# Patient Record
Sex: Male | Born: 1960
Health system: Southern US, Community
[De-identification: ages and names within clinical notes are randomized; demographics above are authoritative.]

## PROBLEM LIST (undated history)

## (undated) DIAGNOSIS — N189 Chronic kidney disease, unspecified: Secondary | ICD-10-CM

## (undated) DIAGNOSIS — J189 Pneumonia, unspecified organism: Secondary | ICD-10-CM

## (undated) DIAGNOSIS — Z992 Dependence on renal dialysis: Secondary | ICD-10-CM

## (undated) DIAGNOSIS — K219 Gastro-esophageal reflux disease without esophagitis: Secondary | ICD-10-CM

## (undated) DIAGNOSIS — D649 Anemia, unspecified: Secondary | ICD-10-CM

## (undated) DIAGNOSIS — G4730 Sleep apnea, unspecified: Secondary | ICD-10-CM

## (undated) DIAGNOSIS — I10 Essential (primary) hypertension: Secondary | ICD-10-CM

## (undated) DIAGNOSIS — N186 End stage renal disease: Secondary | ICD-10-CM

## (undated) HISTORY — PX: MOUTH SURGERY: SHX715

## (undated) HISTORY — DX: Chronic kidney disease, unspecified: N18.9

## (undated) HISTORY — PX: COLONOSCOPY: SHX174

## (undated) SURGERY — COLONOSCOPY
Anesthesia: Moderate Sedation | Laterality: Left

---

## 2005-05-24 ENCOUNTER — Encounter: Admission: RE | Admit: 2005-05-24 | Discharge: 2005-05-24 | Payer: Self-pay | Admitting: Family Medicine

## 2009-12-23 ENCOUNTER — Emergency Department (HOSPITAL_COMMUNITY): Admission: EM | Admit: 2009-12-23 | Discharge: 2009-12-23 | Payer: Self-pay | Admitting: Emergency Medicine

## 2010-12-30 ENCOUNTER — Encounter: Payer: Self-pay | Admitting: Family Medicine

## 2011-08-14 ENCOUNTER — Ambulatory Visit: Payer: Self-pay | Admitting: Dietician

## 2012-11-13 ENCOUNTER — Observation Stay (HOSPITAL_COMMUNITY)
Admission: EM | Admit: 2012-11-13 | Discharge: 2012-11-13 | Disposition: A | Discharge status: Home / Self Care | Payer: Managed Care, Other (non HMO) | Attending: Emergency Medicine | Admitting: Emergency Medicine

## 2012-11-13 ENCOUNTER — Observation Stay (HOSPITAL_COMMUNITY): Payer: Managed Care, Other (non HMO)

## 2012-11-13 ENCOUNTER — Encounter (HOSPITAL_COMMUNITY): Payer: Self-pay | Admitting: *Deleted

## 2012-11-13 DIAGNOSIS — G459 Transient cerebral ischemic attack, unspecified: Secondary | ICD-10-CM

## 2012-11-13 DIAGNOSIS — H538 Other visual disturbances: Principal | ICD-10-CM | POA: Insufficient documentation

## 2012-11-13 DIAGNOSIS — R7989 Other specified abnormal findings of blood chemistry: Secondary | ICD-10-CM

## 2012-11-13 DIAGNOSIS — Z79899 Other long term (current) drug therapy: Secondary | ICD-10-CM | POA: Insufficient documentation

## 2012-11-13 DIAGNOSIS — R944 Abnormal results of kidney function studies: Secondary | ICD-10-CM | POA: Insufficient documentation

## 2012-11-13 DIAGNOSIS — H539 Unspecified visual disturbance: Secondary | ICD-10-CM

## 2012-11-13 DIAGNOSIS — I10 Essential (primary) hypertension: Secondary | ICD-10-CM | POA: Insufficient documentation

## 2012-11-13 HISTORY — DX: Essential (primary) hypertension: I10

## 2012-11-13 LAB — URINE MICROSCOPIC-ADD ON

## 2012-11-13 LAB — CBC WITH DIFFERENTIAL/PLATELET
Basophils Absolute: 0 10*3/uL (ref 0.0–0.1)
Eosinophils Relative: 2 % (ref 0–5)
Lymphocytes Relative: 20 % (ref 12–46)
Lymphs Abs: 2.2 10*3/uL (ref 0.7–4.0)
Neutro Abs: 8.1 10*3/uL — ABNORMAL HIGH (ref 1.7–7.7)
Platelets: 293 10*3/uL (ref 150–400)
WBC: 11 10*3/uL — ABNORMAL HIGH (ref 4.0–10.5)

## 2012-11-13 LAB — COMPREHENSIVE METABOLIC PANEL
AST: 26 U/L (ref 0–37)
Alkaline Phosphatase: 136 U/L — ABNORMAL HIGH (ref 39–117)
BUN: 28 mg/dL — ABNORMAL HIGH (ref 6–23)
CO2: 23 mEq/L (ref 19–32)
Calcium: 9.3 mg/dL (ref 8.4–10.5)
Creatinine, Ser: 3.72 mg/dL — ABNORMAL HIGH (ref 0.50–1.35)
Glucose, Bld: 87 mg/dL (ref 70–99)
Total Bilirubin: 0.4 mg/dL (ref 0.3–1.2)
Total Protein: 7.5 g/dL (ref 6.0–8.3)

## 2012-11-13 LAB — URINALYSIS, ROUTINE W REFLEX MICROSCOPIC
Bilirubin Urine: NEGATIVE
Specific Gravity, Urine: 1.015 (ref 1.005–1.030)
pH: 5.5 (ref 5.0–8.0)

## 2012-11-13 LAB — LIPID PANEL
HDL: 41 mg/dL (ref >39)
LDL Cholesterol: 125 mg/dL — ABNORMAL HIGH (ref 0–99)
Triglycerides: 184 mg/dL — ABNORMAL HIGH (ref <150)
VLDL: 37 mg/dL (ref 0–40)

## 2012-11-13 MED ORDER — ASPIRIN 325 MG PO TBEC
325.0000 mg | DELAYED_RELEASE_TABLET | Freq: Every day | ORAL | Status: DC
Start: 2012-11-13 — End: 2014-01-03

## 2012-11-13 MED ORDER — SODIUM CHLORIDE 0.9 % IV SOLN: Freq: Once | INTRAVENOUS | Status: DC

## 2012-11-13 MED ORDER — ENOXAPARIN SODIUM 40 MG/0.4ML ~~LOC~~ SOLN
40.0000 mg | SUBCUTANEOUS | Status: DC
  Administered 2012-11-13: 40 mg via SUBCUTANEOUS
  Filled 2012-11-13: qty 0.4

## 2012-11-13 NOTE — ED Notes (Signed)
Checked patient vision it was glasses 20/20 both eyes, left eye 20/20, right eye 20/20

## 2012-11-13 NOTE — ED Notes (Signed)
Blurred vision since waking this am. Seen at on-site care facility at [facility] and sent to ED for further eval. Pt states eye exam was performed without diagnosis. Pt states his he is seeing blurred from right eye which has 'gotten better'. No HA. No neuro deficits. Speech clear. Ambulatory without diff

## 2012-11-13 NOTE — Progress Notes (Signed)
  Echocardiogram 2D Echocardiogram has been performed.  Diamond Nickel 11/13/2012, 3:45 PM

## 2012-11-13 NOTE — ED Provider Notes (Signed)
Medical screening examination/treatment/procedure(s) were conducted as a shared visit with non-physician practitioner(s) and myself.  I personally evaluated the patient during the encounter  Ezequiel Essex, MD 11/13/12 2245

## 2012-11-13 NOTE — ED Provider Notes (Signed)
History   This chart was scribed for Ezequiel Essex, MD by Shona Needles, ED Scribe. The patient was seen in room TR04C/TR04C. Patient's care was started at 1231.  CSN: [id number]  Arrival date & time 11/13/12  1231   First MD Initiated Contact with Patient 11/13/12 1257      Chief Complaint  Patient presents with  . Blurred Vision    HPI   Travis [name] is a 51 y.o. male with h/o HTN, who presents to the Emergency Department complaining of 6 hours of new, constant, gradually improving, moderate blurred vision. No pain or injury mechanism denote. Typically healthy, Pt denies CC last night. Prior to arrival, Pt was evaluated by nurse practitioner at work where tests were preformed. No pertinent finding were found, though Pt was encouraged to follow up with ED for possible TIA. Vision is moderately improved upon arrival without treatment. No pain, speech difficulty, photophobia, weakness, chest pain, SOB, or n/v/d. Pt denies use of tobacco products, consumption of alcohol, and use of illicit drugs. Pt denotes inconsistent use of Rx lenses. No pertinent surgical Hx denoted.   Past Medical History  Diagnosis Date  . Hypertension     History reviewed. No pertinent past surgical history.  No family history on file.  History  Substance Use Topics  . Smoking status: Never Smoker   . Smokeless tobacco: Not on file  . Alcohol Use: Yes     Comment: sometimes      Review of Systems  Eyes: Positive for visual disturbance.  All other systems reviewed and are negative.    Allergies  Ibuprofen  Home Medications   Current Outpatient Rx  Name  Route  Sig  Dispense  Refill  . AMLODIPINE BESYLATE 10 MG PO TABS   Oral   Take 10 mg by mouth daily.         . AMOXICILLIN-POT CLAVULANATE 875-125 MG PO TABS   Oral   Take 1 tablet by mouth 2 (two) times daily. For 10 days.         . FEBUXOSTAT 40 MG PO TABS   Oral   Take 40 mg by mouth daily.         Marland Kitchen HYDROCHLOROTHIAZIDE  25 MG PO TABS   Oral   Take 25 mg by mouth daily.         Marland Kitchen RENA-VITE PO TABS   Oral   Take 1 tablet by mouth daily.         Marland Kitchen PARICALCITOL 2 MCG PO CAPS   Oral   Take 2 mcg by mouth daily.         . QUINAPRIL HCL 40 MG PO TABS   Oral   Take 40 mg by mouth at bedtime.           BP 152/106  Pulse 81  Temp 98.3 F (36.8 C) (Oral)  Resp 18  SpO2 97%  Physical Exam  Nursing note and vitals reviewed. Constitutional: He appears well-developed and well-nourished.  HENT:  Head: Normocephalic and atraumatic.  Eyes: Conjunctivae normal are normal. Pupils are equal, round, and reactive to light.  Neck: Neck supple. No tracheal deviation present. No thyromegaly present.  Cardiovascular: Normal rate and regular rhythm.   No murmur heard. Pulmonary/Chest: Effort normal and breath sounds normal.  Abdominal: Soft. Bowel sounds are normal. He exhibits no distension. There is no tenderness.  Musculoskeletal: Normal range of motion. He exhibits no edema and no tenderness.  Neurological: He is alert. Coordination  normal.       Cranial nerves 2-12 intact. 5/5 strength throughout. No ataxia finger to nose. No nystagmus.   Skin: Skin is warm and dry. No rash noted.  Psychiatric: He has a normal mood and affect.    ED Course  Procedures DIAGNOSTIC STUDIES: Oxygen Saturation is 97% on room air, normal by my interpretation.    COORDINATION OF CARE: 13:01- Evaluated Pt. Pt is awake, alert, and without distress.      Labs Reviewed  CBC WITH DIFFERENTIAL  COMPREHENSIVE METABOLIC PANEL  TROPONIN I  PROTIME-INR  APTT  URINALYSIS, ROUTINE W REFLEX MICROSCOPIC  LIPID PANEL  HEMOGLOBIN A1C   No results found.   1. Visual changes       MDM  Change in vision in the right eye since waking up this morning, now improved. No weakness, numbness, tingling, speech or swallowing problems. No headache. History of hypertension.  Nonfocal neuro exam at this time. Visual acuity  normal, eye exam no consider TIA with ABCD 2 score of 3.  Patient agreeable to TIA workup in CDU.   Date: 11/13/2012  Rate: 87  Rhythm: normal sinus rhythm  QRS Axis: normal  Intervals: normal  ST/T Wave abnormalities: normal  Conduction Disutrbances:none  Narrative Interpretation:   Old EKG Reviewed: none available    I personally performed the services described in this documentation, which was scribed in my presence. The recorded information has been reviewed and is accurate.     Ezequiel Essex, MD 11/13/12 [phone]

## 2012-11-13 NOTE — ED Provider Notes (Signed)
Patient in CDU under TIA protocol.  Diagnostic testing has been completed.  Results reviewed, discussed with Dr. Wyvonnia Dusky and shared with patient.  Patient is followed by [facility] for renal insufficiency.  Patient's continuing medical care provided by a NPP at his place of employment, but he has seen the [facility] for primary care in the recent past.  Patient remains symptom-free.  Lungs CTA bilaterally.  S1/S2, RRR. Abdomen soft, bowel sounds present.  MOEx4, equal grip strength.  Strong distal pulses palpated.  Patient will be discharged home with follow-up with [facility] as scheduled this month.  Will be started on aspirin regimen.  Patient currently on medication for hypertension and cholesterolemia.    Norman Herrlich, NP 11/13/12 2340

## 2012-11-13 NOTE — ED Notes (Signed)
Patient transported from MRI 

## 2012-11-13 NOTE — ED Notes (Signed)
Checked patient cbg it was 1 notified RN of blood sugar

## 2012-11-13 NOTE — ED Provider Notes (Signed)
Travis [name] is a 51 y.o. male transferred to CDU from fast track. Signout from Dr. Wyvonnia Dusky as follows: Patient with resolving change in vision earlier in the day. Ophthalmic and neuro exam are normal. No abnormalities on head CT. Patient has ABCD2 score of 3. Patient will be placed on TIA protocol he also has a elevated creatinine of 3.0 he is followed by [facility] for his CRF.  Pt seen and examined at the bedside he is resting comfortably with family is in the room. He reports a resolution to visual changes. Pupils equal round and reactive to light, heart is regular rate and rhythm with no murmurs rubs or gallops, lung sounds are clear to auscultation bilaterally and abdominal exam is benign with no tenderness to palpation or rebound. Strength is 5 out of 5x4 extremities, speech is goal oriented and there is no facial asymmetry.   Signout given to NP Tamala Julian at shift change    Monico Blitz, PA-C 11/13/12 1616

## 2012-11-13 NOTE — Progress Notes (Signed)
VASCULAR LAB PRELIMINARY  PRELIMINARY  PRELIMINARY  PRELIMINARY  Carotid duplex  completed.    Preliminary report: Bilateral:  No evidence of hemodynamically significant internal carotid artery stenosis.   Vertebral artery flow is antegrade.     Takeysha Bonk, [place], RVS 11/13/2012, 4:03 PM

## 2012-11-13 NOTE — ED Notes (Signed)
Pt continues in MRI

## 2012-11-14 NOTE — ED Provider Notes (Signed)
Medical screening examination/treatment/procedure(s) were conducted as a shared visit with non-physician practitioner(s) and myself.  I personally evaluated the patient during the encounter   Ezequiel Essex, MD 11/14/12 1140

## 2013-11-29 ENCOUNTER — Other Ambulatory Visit: Payer: Self-pay | Admitting: *Deleted

## 2013-11-29 DIAGNOSIS — N185 Chronic kidney disease, stage 5: Secondary | ICD-10-CM

## 2013-11-29 DIAGNOSIS — Z01810 Encounter for preprocedural cardiovascular examination: Secondary | ICD-10-CM

## 2013-12-30 ENCOUNTER — Encounter: Payer: Self-pay | Admitting: Vascular Surgery

## 2013-12-31 ENCOUNTER — Encounter: Payer: Self-pay | Admitting: Vascular Surgery

## 2013-12-31 ENCOUNTER — Ambulatory Visit (INDEPENDENT_AMBULATORY_CARE_PROVIDER_SITE_OTHER): Payer: BC Managed Care – PPO | Admitting: Vascular Surgery

## 2013-12-31 ENCOUNTER — Ambulatory Visit (INDEPENDENT_AMBULATORY_CARE_PROVIDER_SITE_OTHER)
Admission: RE | Admit: 2013-12-31 | Discharge: 2013-12-31 | Disposition: A | Discharge status: Home / Self Care | Payer: BC Managed Care – PPO | Source: Ambulatory Visit | Attending: Vascular Surgery | Admitting: Vascular Surgery

## 2013-12-31 ENCOUNTER — Other Ambulatory Visit: Payer: Self-pay

## 2013-12-31 ENCOUNTER — Ambulatory Visit (HOSPITAL_COMMUNITY)
Admission: RE | Admit: 2013-12-31 | Discharge: 2013-12-31 | Disposition: A | Discharge status: Home / Self Care | Payer: BC Managed Care – PPO | Source: Ambulatory Visit | Attending: Vascular Surgery | Admitting: Vascular Surgery

## 2013-12-31 ENCOUNTER — Encounter (INDEPENDENT_AMBULATORY_CARE_PROVIDER_SITE_OTHER): Payer: Self-pay

## 2013-12-31 VITALS — BP 159/100 | HR 78 | Ht 71.0 in | Wt 254.0 lb

## 2013-12-31 DIAGNOSIS — N185 Chronic kidney disease, stage 5: Secondary | ICD-10-CM | POA: Insufficient documentation

## 2013-12-31 DIAGNOSIS — N186 End stage renal disease: Secondary | ICD-10-CM

## 2013-12-31 DIAGNOSIS — Z01810 Encounter for preprocedural cardiovascular examination: Secondary | ICD-10-CM

## 2013-12-31 NOTE — Progress Notes (Signed)
Referred by:  Sherril Croon, MD [address] [place], [place] [address]  Reason for referral: New access  History of Present Illness  Travis [name] is a 53 y.o. (01-29-1961) male who presents for evaluation for permanent access.  The patient is right hand dominant.  The patient has not had previous access procedures.  Previous central venous cannulation procedures include: never.  The patient has never had a PPM placed.  He has focal segmental glomerulosclerosis and HTN as the source of his CKD.  Additionally, he is going to undergo a radial nephrectomy due to suspicious mass in R kidney.    Past Medical History  Diagnosis Date  . Hypertension   . Chronic kidney disease    Past Surgical History: none  History   Social History  . Marital Status: Married    Spouse Name: N/A    Number of Children: N/A  . Years of Education: N/A   Occupational History  . Not on file.   Social History Main Topics  . Smoking status: Former Smoker    Quit date: 12/31/2012  . Smokeless tobacco: Not on file  . Alcohol Use: No  . Drug Use: No  . Sexual Activity: Not on file   Other Topics Concern  . Not on file   Social History Narrative  . No narrative on file    Family History  Problem Relation Age of Onset  . Hypertension Mother   . Hypertension Father   . Hypertension Sister    Current Outpatient Prescriptions on File Prior to Visit  Medication Sig Dispense Refill  . amLODipine (NORVASC) 10 MG tablet Take 10 mg by mouth daily.      . hydrochlorothiazide (HYDRODIURIL) 25 MG tablet Take 25 mg by mouth daily.      . multivitamin (RENA-VIT) TABS tablet Take 1 tablet by mouth daily.      . paricalcitol (ZEMPLAR) 2 MCG capsule Take 2 mcg by mouth daily.      . quinapril (ACCUPRIL) 40 MG tablet Take 40 mg by mouth at bedtime.      Marland Kitchen amoxicillin-clavulanate (AUGMENTIN) 875-125 MG per tablet Take 1 tablet by mouth 2 (two) times daily. For 10 days.      Marland Kitchen aspirin (ASPIR-TRIN) 325 MG EC  tablet Take 1 tablet (325 mg total) by mouth daily.  30 tablet  0  . febuxostat (ULORIC) 40 MG tablet Take 40 mg by mouth daily.       No current facility-administered medications on file prior to visit.    Allergies  Allergen Reactions  . Ibuprofen Hives    REVIEW OF SYSTEMS:  (Positives checked otherwise negative)  CARDIOVASCULAR:  []  chest pain, []  chest pressure, []  palpitations, []  shortness of breath when laying flat, []  shortness of breath with exertion,  []  pain in feet when walking, []  pain in feet when laying flat, []  history of blood clot in veins (DVT), []  history of phlebitis, []  swelling in legs, []  varicose veins  PULMONARY:  []  productive cough, []  asthma, []  wheezing  NEUROLOGIC:  []  weakness in arms or legs, []  numbness in arms or legs, []  difficulty speaking or slurred speech, []  temporary loss of vision in one eye, []  dizziness  HEMATOLOGIC:  []  bleeding problems, []  problems with blood clotting too easily  MUSCULOSKEL:  []  joint pain, []  joint swelling  GASTROINTEST:  []  vomiting blood, []  blood in stool     GENITOURINARY:  []  burning with urination, []  blood in  urine  PSYCHIATRIC:  []  history of major depression  INTEGUMENTARY:  []  rashes, []  ulcers  CONSTITUTIONAL:  []  fever, []  chills   Physical Examination  Filed Vitals:   12/31/13 1055  BP: 159/100  Pulse: 78  Height: 5\' 11"  (1.803 m)  Weight: 254 lb (115.214 kg)  SpO2: 100%   Body mass index is 35.44 kg/(m^2).  General: A&O x 3, WD, mildly obese  Head: Leigh/AT  Ear/Nose/Throat: Hearing grossly intact, nares w/o erythema or drainage, oropharynx w/o Erythema/Exudate, Mallampati score: 3  Eyes: PERRLA, EOMI  Neck: Supple, no nuchal rigidity, no palpable LAD  Pulmonary: Sym exp, good air movt, CTAB, no rales, rhonchi, & wheezing  Cardiac: RRR, Nl S1, S2, no Murmurs, rubs or gallops  Vascular: Vessel Right Left  Radial Palpable Palpable  Ulnar Palpable Palpable  Brachial Palpable  Palpable  Carotid Palpable, without bruit Palpable, without bruit  Aorta Not palpable N/A  Femoral Palpable Palpable  Popliteal Not palpable Not palpable  PT Palpable Palpable  DP Not Palpable Not Palpable   Gastrointestinal: soft, NTND, -G/R, - HSM, - masses, - CVAT B  Musculoskeletal: M/S 5/5 throughout , Extremities without ischemic changes   Neurologic: CN 2-12 intact , Pain and light touch intact in extremities , Motor exam as listed above  Psychiatric: Judgment intact, Mood & affect appropriate for pt's clinical situation  Dermatologic: See M/S exam for extremity exam, no rashes otherwise noted  Lymph : No Cervical, Axillary, or Inguinal lymphadenopathy   Non-Invasive Vascular Imaging  Vein Mapping  (Date: 12/31/2013):   R arm: acceptable vein conduits include Marginal cephalic and basilic vein  L arm: acceptable vein conduits include basilic vein  Outside Studies/Documentation 5 pages of outside documents were reviewed including: nephrology clinic chart.  Medical Decision Making  Travis [name] is a 53 y.o. 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  I had an extensive discussion with this patient in regards to the nature of access surgery, including risk, benefits, and alternatives.    The patient is aware that the risks of access surgery include but are not limited to: bleeding, infection, steal syndrome, nerve damage, ischemic monomelic neuropathy, failure of access to mature, and possible need for additional access procedures in the future. I discussed with the patient the nature of the staged access procedure, specifically the need for a second operation to transpose the first stage fistula if it  matures adequately.    The patient has agreed to proceed with the above procedure which will be scheduled.  Adele Barthel, MD [facility] Office: [phone] Pager: [phone]  12/31/2013, 11:40 AM

## 2014-01-03 ENCOUNTER — Encounter (HOSPITAL_COMMUNITY)
Admission: RE | Admit: 2014-01-03 | Discharge: 2014-01-03 | Disposition: A | Discharge status: Home / Self Care | Payer: BC Managed Care – PPO | Source: Ambulatory Visit | Attending: Vascular Surgery | Admitting: Vascular Surgery

## 2014-01-03 ENCOUNTER — Encounter (HOSPITAL_COMMUNITY)
Admission: RE | Admit: 2014-01-03 | Discharge: 2014-01-03 | Disposition: A | Discharge status: Home / Self Care | Payer: BC Managed Care – PPO | Source: Ambulatory Visit | Attending: Anesthesiology | Admitting: Anesthesiology

## 2014-01-03 ENCOUNTER — Encounter (HOSPITAL_COMMUNITY): Payer: Self-pay

## 2014-01-03 ENCOUNTER — Other Ambulatory Visit (HOSPITAL_COMMUNITY): Payer: Self-pay | Admitting: *Deleted

## 2014-01-03 HISTORY — DX: Sleep apnea, unspecified: G47.30

## 2014-01-03 HISTORY — DX: Pneumonia, unspecified organism: J18.9

## 2014-01-03 HISTORY — DX: Gastro-esophageal reflux disease without esophagitis: K21.9

## 2014-01-03 LAB — CBC
HEMATOCRIT: 33.6 % — AB (ref 39.0–52.0)
Hemoglobin: 11.4 g/dL — ABNORMAL LOW (ref 13.0–17.0)
MCH: 27.8 pg (ref 26.0–34.0)
MCHC: 33.9 g/dL (ref 30.0–36.0)
MCV: 82 fL (ref 78.0–100.0)
PLATELETS: 275 10*3/uL (ref 150–400)
RBC: 4.1 MIL/uL — AB (ref 4.22–5.81)
RDW: 15 % (ref 11.5–15.5)
WBC: 9.9 10*3/uL (ref 4.0–10.5)

## 2014-01-03 NOTE — Pre-Procedure Instructions (Signed)
Travis [name]  01/03/2014   Your procedure is scheduled on:  Wednesday, January 05, 2014 at 8:30 AM.   Report to [facility] Entrance "A" Admitting Office at 6:30 AM.   Call this number if you have problems the morning of surgery: [phone]   Remember:   Do not eat food or drink liquids after midnight Tuesday, 01/04/14.   Take these medicines the morning of surgery with A SIP OF WATER: amLODipine (NORVASC), Nexium     Do not wear jewelry.  Do not wear lotions, powders, or cologne. You may wear deodorant.  Men may shave face and neck.  Do not bring valuables to the hospital.  [facility] is not responsible                  for any belongings or valuables.               Contacts, dentures or bridgework may not be worn into surgery.  Leave suitcase in the car. After surgery it may be brought to your room.  For patients admitted to the hospital, discharge time is determined by your                treatment team.               Patients discharged the day of surgery will not be allowed to drive  home.  Name and phone number of your driver: Family/friend   Special Instructions: Shower using CHG 2 nights before surgery and the night before surgery.  If you shower the day of surgery use CHG.  Use special wash - you have one bottle of CHG for all showers.  You should use approximately 1/3 of the bottle for each shower.   Please read over the following fact sheets that you were given: Pain Booklet, Coughing and Deep Breathing and Surgical Site Infection Prevention

## 2014-01-04 MED ORDER — DEXTROSE 5 % IV SOLN
1.5000 g | INTRAVENOUS | Status: AC
  Administered 2014-01-05: 1.5 g via INTRAVENOUS
  Filled 2014-01-04: qty 1.5

## 2014-01-05 ENCOUNTER — Encounter (HOSPITAL_COMMUNITY): Payer: Self-pay | Admitting: Certified Registered Nurse Anesthetist

## 2014-01-05 ENCOUNTER — Telehealth: Payer: Self-pay | Admitting: Vascular Surgery

## 2014-01-05 ENCOUNTER — Ambulatory Visit (HOSPITAL_COMMUNITY): Payer: BC Managed Care – PPO | Admitting: Certified Registered Nurse Anesthetist

## 2014-01-05 ENCOUNTER — Encounter (HOSPITAL_COMMUNITY)
Admission: RE | Disposition: A | Discharge status: Home / Self Care | Payer: Self-pay | Source: Ambulatory Visit | Attending: Vascular Surgery

## 2014-01-05 ENCOUNTER — Other Ambulatory Visit: Payer: Self-pay | Admitting: *Deleted

## 2014-01-05 ENCOUNTER — Ambulatory Visit (HOSPITAL_COMMUNITY)
Admission: RE | Admit: 2014-01-05 | Discharge: 2014-01-05 | Disposition: A | Discharge status: Home / Self Care | Payer: BC Managed Care – PPO | Source: Ambulatory Visit | Attending: Vascular Surgery | Admitting: Vascular Surgery

## 2014-01-05 ENCOUNTER — Encounter (HOSPITAL_COMMUNITY): Payer: BC Managed Care – PPO | Admitting: Certified Registered Nurse Anesthetist

## 2014-01-05 DIAGNOSIS — I120 Hypertensive chronic kidney disease with stage 5 chronic kidney disease or end stage renal disease: Secondary | ICD-10-CM | POA: Insufficient documentation

## 2014-01-05 DIAGNOSIS — N032 Chronic nephritic syndrome with diffuse membranous glomerulonephritis: Secondary | ICD-10-CM | POA: Insufficient documentation

## 2014-01-05 DIAGNOSIS — Z87891 Personal history of nicotine dependence: Secondary | ICD-10-CM | POA: Insufficient documentation

## 2014-01-05 DIAGNOSIS — Z6835 Body mass index (BMI) 35.0-35.9, adult: Secondary | ICD-10-CM | POA: Insufficient documentation

## 2014-01-05 DIAGNOSIS — N186 End stage renal disease: Secondary | ICD-10-CM

## 2014-01-05 DIAGNOSIS — E669 Obesity, unspecified: Secondary | ICD-10-CM | POA: Insufficient documentation

## 2014-01-05 DIAGNOSIS — N185 Chronic kidney disease, stage 5: Secondary | ICD-10-CM

## 2014-01-05 DIAGNOSIS — Z48812 Encounter for surgical aftercare following surgery on the circulatory system: Secondary | ICD-10-CM

## 2014-01-05 DIAGNOSIS — Z7982 Long term (current) use of aspirin: Secondary | ICD-10-CM | POA: Insufficient documentation

## 2014-01-05 HISTORY — PX: BASCILIC VEIN TRANSPOSITION: SHX5742

## 2014-01-05 LAB — POCT I-STAT 4, (NA,K, GLUC, HGB,HCT)
GLUCOSE: 95 mg/dL (ref 70–99)
HEMATOCRIT: 33 % — AB (ref 39.0–52.0)
Hemoglobin: 11.2 g/dL — ABNORMAL LOW (ref 13.0–17.0)
Potassium: 3.8 mEq/L (ref 3.7–5.3)
Sodium: 145 mEq/L (ref 137–147)

## 2014-01-05 SURGERY — TRANSPOSITION, VEIN, BASILIC
Anesthesia: General | Site: Arm Upper | Laterality: Left

## 2014-01-05 MED ORDER — LIDOCAINE-EPINEPHRINE (PF) 1 %-1:200000 IJ SOLN
INTRAMUSCULAR | Status: DC | PRN
  Administered 2014-01-05: 30 mL

## 2014-01-05 MED ORDER — OXYCODONE HCL 5 MG PO TABS: 5.0000 mg | ORAL_TABLET | ORAL | Status: DC | PRN

## 2014-01-05 MED ORDER — SUCCINYLCHOLINE CHLORIDE 20 MG/ML IJ SOLN
INTRAMUSCULAR | Status: AC
  Filled 2014-01-05: qty 1

## 2014-01-05 MED ORDER — THROMBIN 20000 UNITS EX SOLR
CUTANEOUS | Status: AC
  Filled 2014-01-05: qty 20000

## 2014-01-05 MED ORDER — FENTANYL CITRATE 0.05 MG/ML IJ SOLN
INTRAMUSCULAR | Status: DC | PRN
  Administered 2014-01-05: 25 ug via INTRAVENOUS
  Administered 2014-01-05: 50 ug via INTRAVENOUS
  Administered 2014-01-05: 25 ug via INTRAVENOUS
  Administered 2014-01-05: 50 ug via INTRAVENOUS

## 2014-01-05 MED ORDER — LIDOCAINE HCL (CARDIAC) 20 MG/ML IV SOLN
INTRAVENOUS | Status: AC
  Filled 2014-01-05: qty 5

## 2014-01-05 MED ORDER — ONDANSETRON HCL 4 MG/2ML IJ SOLN
INTRAMUSCULAR | Status: AC
  Filled 2014-01-05: qty 2

## 2014-01-05 MED ORDER — OXYCODONE HCL 5 MG PO TABS
ORAL_TABLET | ORAL | Status: AC
  Administered 2014-01-05: 5 mg via ORAL
  Filled 2014-01-05: qty 1

## 2014-01-05 MED ORDER — HYDROMORPHONE HCL PF 1 MG/ML IJ SOLN
0.2500 mg | INTRAMUSCULAR | Status: DC | PRN
  Administered 2014-01-05 (×2): 0.5 mg via INTRAVENOUS

## 2014-01-05 MED ORDER — ROCURONIUM BROMIDE 50 MG/5ML IV SOLN
INTRAVENOUS | Status: AC
  Filled 2014-01-05: qty 1

## 2014-01-05 MED ORDER — HYDROMORPHONE HCL PF 1 MG/ML IJ SOLN
INTRAMUSCULAR | Status: AC
  Administered 2014-01-05: 0.5 mg via INTRAVENOUS
  Filled 2014-01-05: qty 1

## 2014-01-05 MED ORDER — 0.9 % SODIUM CHLORIDE (POUR BTL) OPTIME
TOPICAL | Status: DC | PRN
  Administered 2014-01-05: 1000 mL

## 2014-01-05 MED ORDER — FENTANYL CITRATE 0.05 MG/ML IJ SOLN
INTRAMUSCULAR | Status: AC
  Filled 2014-01-05: qty 5

## 2014-01-05 MED ORDER — STERILE WATER FOR INJECTION IJ SOLN
INTRAMUSCULAR | Status: AC
  Filled 2014-01-05: qty 10

## 2014-01-05 MED ORDER — PROPOFOL INFUSION 10 MG/ML OPTIME
INTRAVENOUS | Status: DC | PRN
  Administered 2014-01-05 (×2): 75 ug/kg/min via INTRAVENOUS

## 2014-01-05 MED ORDER — OXYCODONE HCL 5 MG PO TABS
5.0000 mg | ORAL_TABLET | Freq: Once | ORAL | Status: AC
  Administered 2014-01-05: 5 mg via ORAL

## 2014-01-05 MED ORDER — SODIUM CHLORIDE 0.9 % IV SOLN: INTRAVENOUS | Status: DC

## 2014-01-05 MED ORDER — PROPOFOL 10 MG/ML IV BOLUS
INTRAVENOUS | Status: AC
  Filled 2014-01-05: qty 20

## 2014-01-05 MED ORDER — SODIUM CHLORIDE 0.9 % IV SOLN
INTRAVENOUS | Status: DC | PRN
  Administered 2014-01-05 (×2): via INTRAVENOUS

## 2014-01-05 MED ORDER — MIDAZOLAM HCL 2 MG/2ML IJ SOLN
INTRAMUSCULAR | Status: AC
  Filled 2014-01-05: qty 2

## 2014-01-05 MED ORDER — MIDAZOLAM HCL 5 MG/5ML IJ SOLN
INTRAMUSCULAR | Status: DC | PRN
Start: 2014-01-05 — End: 2014-01-05
  Administered 2014-01-05: 2 mg via INTRAVENOUS

## 2014-01-05 MED ORDER — PHENYLEPHRINE 40 MCG/ML (10ML) SYRINGE FOR IV PUSH (FOR BLOOD PRESSURE SUPPORT)
PREFILLED_SYRINGE | INTRAVENOUS | Status: AC
  Filled 2014-01-05: qty 10

## 2014-01-05 MED ORDER — PROPOFOL 10 MG/ML IV EMUL
INTRAVENOUS | Status: AC
  Filled 2014-01-05: qty 100

## 2014-01-05 MED ORDER — EPHEDRINE SULFATE 50 MG/ML IJ SOLN
INTRAMUSCULAR | Status: AC
  Filled 2014-01-05: qty 1

## 2014-01-05 MED ORDER — BUPIVACAINE HCL (PF) 0.5 % IJ SOLN
INTRAMUSCULAR | Status: DC | PRN
  Administered 2014-01-05: 30 mL

## 2014-01-05 MED ORDER — ONDANSETRON HCL 4 MG/2ML IJ SOLN
INTRAMUSCULAR | Status: DC | PRN
  Administered 2014-01-05: 4 mg via INTRAVENOUS

## 2014-01-05 MED ORDER — SODIUM CHLORIDE 0.9 % IR SOLN
Status: DC | PRN
  Administered 2014-01-05: 09:00:00

## 2014-01-05 MED ORDER — PROPOFOL 10 MG/ML IV BOLUS
INTRAVENOUS | Status: AC
Start: 2014-01-05 — End: 2014-01-05
  Filled 2014-01-05: qty 20

## 2014-01-05 SURGICAL SUPPLY — 41 items
ADH SKN CLS APL DERMABOND .7 (GAUZE/BANDAGES/DRESSINGS) ×1
CANISTER SUCTION 2500CC (MISCELLANEOUS) ×3 IMPLANT
CLIP TI MEDIUM 24 (CLIP) ×3 IMPLANT
CLIP TI WIDE RED SMALL 24 (CLIP) ×3 IMPLANT
CORDS BIPOLAR (ELECTRODE) IMPLANT
COVER PROBE W GEL 5X96 (DRAPES) IMPLANT
COVER SURGICAL LIGHT HANDLE (MISCELLANEOUS) ×3 IMPLANT
DECANTER SPIKE VIAL GLASS SM (MISCELLANEOUS) ×3 IMPLANT
DERMABOND ADVANCED (GAUZE/BANDAGES/DRESSINGS) ×2
DERMABOND ADVANCED .7 DNX12 (GAUZE/BANDAGES/DRESSINGS) ×1 IMPLANT
ELECT REM PT RETURN 9FT ADLT (ELECTROSURGICAL) ×3
ELECTRODE REM PT RTRN 9FT ADLT (ELECTROSURGICAL) ×1 IMPLANT
GLOVE BIO SURGEON STRL SZ 6.5 (GLOVE) ×1 IMPLANT
GLOVE BIO SURGEON STRL SZ7 (GLOVE) ×3 IMPLANT
GLOVE BIO SURGEONS STRL SZ 6.5 (GLOVE) ×1
GLOVE BIOGEL PI IND STRL 6.5 (GLOVE) IMPLANT
GLOVE BIOGEL PI IND STRL 7.0 (GLOVE) IMPLANT
GLOVE BIOGEL PI IND STRL 7.5 (GLOVE) ×1 IMPLANT
GLOVE BIOGEL PI INDICATOR 6.5 (GLOVE) ×4
GLOVE BIOGEL PI INDICATOR 7.0 (GLOVE) ×4
GLOVE BIOGEL PI INDICATOR 7.5 (GLOVE) ×2
GOWN STRL REUS W/ TWL LRG LVL3 (GOWN DISPOSABLE) ×3 IMPLANT
GOWN STRL REUS W/ TWL XL LVL3 (GOWN DISPOSABLE) IMPLANT
GOWN STRL REUS W/TWL LRG LVL3 (GOWN DISPOSABLE) ×9
GOWN STRL REUS W/TWL XL LVL3 (GOWN DISPOSABLE) ×3
KIT BASIN OR (CUSTOM PROCEDURE TRAY) ×3 IMPLANT
KIT ROOM TURNOVER OR (KITS) ×3 IMPLANT
NS IRRIG 1000ML POUR BTL (IV SOLUTION) ×3 IMPLANT
PACK CV ACCESS (CUSTOM PROCEDURE TRAY) ×3 IMPLANT
PAD ARMBOARD 7.5X6 YLW CONV (MISCELLANEOUS) ×6 IMPLANT
SPONGE SURGIFOAM ABS GEL 100 (HEMOSTASIS) IMPLANT
SUT MNCRL AB 4-0 PS2 18 (SUTURE) ×3 IMPLANT
SUT PROLENE 6 0 BV (SUTURE) ×6 IMPLANT
SUT PROLENE 7 0 BV 1 (SUTURE) ×3 IMPLANT
SUT SILK 2 0 SH (SUTURE) ×3 IMPLANT
SUT VIC AB 3-0 SH 27 (SUTURE) ×3
SUT VIC AB 3-0 SH 27X BRD (SUTURE) ×1 IMPLANT
TOWEL OR 17X24 6PK STRL BLUE (TOWEL DISPOSABLE) ×3 IMPLANT
TOWEL OR 17X26 10 PK STRL BLUE (TOWEL DISPOSABLE) ×3 IMPLANT
UNDERPAD 30X30 INCONTINENT (UNDERPADS AND DIAPERS) ×3 IMPLANT
WATER STERILE IRR 1000ML POUR (IV SOLUTION) ×3 IMPLANT

## 2014-01-05 NOTE — Op Note (Signed)
OPERATIVE NOTE   PROCEDURE: 1. Left arm exploration 2. Left first stage brachial vein transposition (brachiobrachial arteriovenous fistula) placement  PRE-OPERATIVE DIAGNOSIS: chronic kidney disease stage V   POST-OPERATIVE DIAGNOSIS: same as above   SURGEON: Adele Barthel, MD  ANESTHESIA: local and MAC  ESTIMATED BLOOD LOSS: 50 cc  FINDING(S): 1. Basilic vein too short for use for dialysis conduit 2. Palpable thrill and radial pulse at end of case  SPECIMEN(S):  none  INDICATIONS:   Travis [name] is a 53 y.o. male who presents with chronic kidney disease stage V.  The patient is scheduled for left first stage brachial vein transposition.  The patient is aware the risks include but are not limited to: bleeding, infection, steal syndrome, nerve damage, ischemic monomelic neuropathy, failure to mature, and need for additional procedures.  The patient is aware of the risks of the procedure and elects to proceed forward.  DESCRIPTION: After full informed written consent was obtained from the patient, the patient was brought back to the operating room and placed supine upon the operating table.  Prior to induction, the patient received IV antibiotics.   After obtaining adequate anesthesia, the patient was then prepped and draped in the standard fashion for a left arm access procedure.  I turned my attention first to identifying the patient's basilic vein and brachial artery.  I also identified a small cubital vein.  Using SonoSite guidance, the location of these vessels were marked out on the skin.   At this point, I injected local anesthetic to obtain a field block of the antecubitum.  In total, I injected about 10 mL of a 1:1 mixture of 0.5% Marcaine without epinephrine and 1% lidocaine with epinephrine.  I made a transverse incision at the level of the antecubitum and dissected through the subcutaneous tissue and fascia to gain exposure of the brachial artery.  This was noted to be 4  mm in diameter externally.  This was dissected out proximally and distally and controlled with vessel loops.  In this process, I dissected out a cubital vein that was <2 mm in diameter externally.  I could not dilate this vein so it was ligation and transected.  I then dissected out the basilic vein.  I was also inadequate distally, <2 mm.  The >3 mm segment was almost 2 cm more proximal, thus too short for use for a fistula.  I then dissected out the brachial vein.  This was noted to be 4 mm in diameter externally.  The distal segment of the vein was ligated with a  2-0 silk, and the vein was transected.  The proximal segment was interrogated with serial dilators.  The vein accepted up to a 3 mm dilator without any difficulty.  I then instilled the heparinized saline into the vein and clamped it.  At this point, I reset my exposure of the brachial artery and placed the artery under tension proximally and distally.  I made an arteriotomy with a #11 blade, and then I extended the arteriotomy with a Potts scissor.  I injected heparinized saline proximal and distal to this arteriotomy.  The vein was then sewn to the artery in an end-to-side configuration with a running stitch of 6-0 Prolene.  Prior to completing this anastomosis, I allowed the vein and artery to backbleed.  There was no evidence of clot from any vessels.  I completed the anastomosis in the usual fashion and then released all vessel loops and clamps.  There was  a palpable  thrill in the venous outflow, and there was a palpable radial pulse.  At this point, I irrigated out the surgical wound.  There was no further active bleeding.  The subcutaneous tissue was reapproximated with a running stitch of 3-0 Vicryl.  The skin was then reapproximated with a running subcuticular stitch of 4-0 Vicryl.  The skin was then cleaned, dried, and reinforced with Dermabond.  The patient tolerated this procedure well.   COMPLICATIONS: none  CONDITION: stable  Adele Barthel, MD [facility] Office: [phone] Pager: [phone]  01/05/2014, 9:53 AM

## 2014-01-05 NOTE — Telephone Encounter (Addendum)
Message copied by Gena Fray on Wed Jan 05, 2014  2:48 PM ------      Message from: Peter Minium K      Created: Wed Jan 05, 2014 10:57 AM      Regarding: Schedule                   ----- Message -----         From: Conrad Keota, MD         Sent: 01/05/2014   9:58 AM           To: Patrici Ranks, Vvs Charge Kenechukwu Conroy      MW:[phone]      1961-06-16            Procedure: L 1st stage BRVT            Follow-up: 6 weeks       ------  01/05/14: vm box is full, mailed letter regarding appt dated 02/18/14 at 10:30am, dpm

## 2014-01-05 NOTE — Progress Notes (Signed)
Report given to Teresa RN

## 2014-01-05 NOTE — Preoperative (Signed)
Beta Blockers   Reason not to administer Beta Blockers:Not Applicable 

## 2014-01-05 NOTE — Progress Notes (Signed)
Notified Dr.Frederick  Of pt's blood pressure. No new orders.

## 2014-01-05 NOTE — Interval H&P Note (Signed)
History and Physical Interval Note:  01/05/2014 7:13 AM  Travis [name]  has presented today for surgery, with the diagnosis of End Stage Renal Disease  The various methods of treatment have been discussed with the patient and family. After consideration of risks, benefits and other options for treatment, the patient has consented to  Procedure(s): LEFT 1ST STAGE [place] (Left) as a surgical intervention .  The patient's history has been reviewed, patient examined, no change in status, stable for surgery.  I have reviewed the patient's chart and labs.  Questions were answered to the patient's satisfaction.     CHEN,BRIAN LIANG-YU

## 2014-01-05 NOTE — H&P (View-Only) (Signed)
Referred by:  Sherril Croon, MD [address] [place],  [address]  Reason for referral: New access  History of Present Illness  Travis [name] is a 53 y.o. (21-Oct-1961) male who presents for evaluation for permanent access.  The patient is right hand dominant.  The patient has not had previous access procedures.  Previous central venous cannulation procedures include: never.  The patient has never had a PPM placed.  He has focal segmental glomerulosclerosis and HTN as the source of his CKD.  Additionally, he is going to undergo a radial nephrectomy due to suspicious mass in R kidney.    Past Medical History  Diagnosis Date  . Hypertension   . Chronic kidney disease    Past Surgical History: none  History   Social History  . Marital Status: Married    Spouse Name: N/A    Number of Children: N/A  . Years of Education: N/A   Occupational History  . Not on file.   Social History Main Topics  . Smoking status: Former Smoker    Quit date: 12/31/2012  . Smokeless tobacco: Not on file  . Alcohol Use: No  . Drug Use: No  . Sexual Activity: Not on file   Other Topics Concern  . Not on file   Social History Narrative  . No narrative on file    Family History  Problem Relation Age of Onset  . Hypertension Mother   . Hypertension Father   . Hypertension Sister    Current Outpatient Prescriptions on File Prior to Visit  Medication Sig Dispense Refill  . amLODipine (NORVASC) 10 MG tablet Take 10 mg by mouth daily.      . hydrochlorothiazide (HYDRODIURIL) 25 MG tablet Take 25 mg by mouth daily.      . multivitamin (RENA-VIT) TABS tablet Take 1 tablet by mouth daily.      . paricalcitol (ZEMPLAR) 2 MCG capsule Take 2 mcg by mouth daily.      . quinapril (ACCUPRIL) 40 MG tablet Take 40 mg by mouth at bedtime.      Marland Kitchen amoxicillin-clavulanate (AUGMENTIN) 875-125 MG per tablet Take 1 tablet by mouth 2 (two) times daily. For 10 days.      Marland Kitchen aspirin (ASPIR-TRIN) 325 MG EC  tablet Take 1 tablet (325 mg total) by mouth daily.  30 tablet  0  . febuxostat (ULORIC) 40 MG tablet Take 40 mg by mouth daily.       No current facility-administered medications on file prior to visit.    Allergies  Allergen Reactions  . Ibuprofen Hives    REVIEW OF SYSTEMS:  (Positives checked otherwise negative)  CARDIOVASCULAR:  []  chest pain, []  chest pressure, []  palpitations, []  shortness of breath when laying flat, []  shortness of breath with exertion,  []  pain in feet when walking, []  pain in feet when laying flat, []  history of blood clot in veins (DVT), []  history of phlebitis, []  swelling in legs, []  varicose veins  PULMONARY:  []  productive cough, []  asthma, []  wheezing  NEUROLOGIC:  []  weakness in arms or legs, []  numbness in arms or legs, []  difficulty speaking or slurred speech, []  temporary loss of vision in one eye, []  dizziness  HEMATOLOGIC:  []  bleeding problems, []  problems with blood clotting too easily  MUSCULOSKEL:  []  joint pain, []  joint swelling  GASTROINTEST:  []  vomiting blood, []  blood in stool     GENITOURINARY:  []  burning with urination, []  blood in  urine  PSYCHIATRIC:  []  history of major depression  INTEGUMENTARY:  []  rashes, []  ulcers  CONSTITUTIONAL:  []  fever, []  chills   Physical Examination  Filed Vitals:   12/31/13 1055  BP: 159/100  Pulse: 78  Height: 5\' 11"  (1.803 m)  Weight: 254 lb (115.214 kg)  SpO2: 100%   Body mass index is 35.44 kg/(m^2).  General: A&O x 3, WD, mildly obese  Head: [place]/AT  Ear/Nose/Throat: Hearing grossly intact, nares w/o erythema or drainage, oropharynx w/o Erythema/Exudate, Mallampati score: 3  Eyes: PERRLA, EOMI  Neck: Supple, no nuchal rigidity, no palpable LAD  Pulmonary: Sym exp, good air movt, CTAB, no rales, rhonchi, & wheezing  Cardiac: RRR, Nl S1, S2, no Murmurs, rubs or gallops  Vascular: Vessel Right Left  Radial Palpable Palpable  Ulnar Palpable Palpable  Brachial Palpable  Palpable  Carotid Palpable, without bruit Palpable, without bruit  Aorta Not palpable N/A  Femoral Palpable Palpable  Popliteal Not palpable Not palpable  PT Palpable Palpable  DP Not Palpable Not Palpable   Gastrointestinal: soft, NTND, -G/R, - HSM, - masses, - CVAT B  Musculoskeletal: M/S 5/5 throughout , Extremities without ischemic changes   Neurologic: CN 2-12 intact , Pain and light touch intact in extremities , Motor exam as listed above  Psychiatric: Judgment intact, Mood & affect appropriate for pt's clinical situation  Dermatologic: See M/S exam for extremity exam, no rashes otherwise noted  Lymph : No Cervical, Axillary, or Inguinal lymphadenopathy   Non-Invasive Vascular Imaging  Vein Mapping  (Date: 12/31/2013):   R arm: acceptable vein conduits include Marginal cephalic and basilic vein  L arm: acceptable vein conduits include basilic vein  Outside Studies/Documentation 5 pages of outside documents were reviewed including: nephrology clinic chart.  Medical Decision Making  Travis [name] is a 53 y.o. 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  I had an extensive discussion with this patient in regards to the nature of access surgery, including risk, benefits, and alternatives.    The patient is aware that the risks of access surgery include but are not limited to: bleeding, infection, steal syndrome, nerve damage, ischemic monomelic neuropathy, failure of access to mature, and possible need for additional access procedures in the future. I discussed with the patient the nature of the staged access procedure, specifically the need for a second operation to transpose the first stage fistula if it  matures adequately.    The patient has agreed to proceed with the above procedure which will be scheduled.  Adele Barthel, MD [facility] Office: [phone] Pager: [phone]  12/31/2013, 11:40 AM

## 2014-01-05 NOTE — Anesthesia Preprocedure Evaluation (Signed)
Anesthesia Evaluation  Patient identified by MRN, date of birth, ID band Patient awake    Reviewed: Allergy & Precautions, H&P , NPO status , Patient's Chart, lab work & pertinent test results  Airway Mallampati: II      Dental   Pulmonary sleep apnea , pneumonia -, former smoker,          Cardiovascular hypertension,     Neuro/Psych    GI/Hepatic GERD-  ,  Endo/Other    Renal/GU Renal disease     Musculoskeletal   Abdominal   Peds  Hematology   Anesthesia Other Findings   Reproductive/Obstetrics                           Anesthesia Physical Anesthesia Plan  ASA: III  Anesthesia Plan: General   Post-op Pain Management:    Induction: Intravenous  Airway Management Planned: Natural Airway  Additional Equipment:   Intra-op Plan:   Post-operative Plan:   Informed Consent: I have reviewed the patients History and Physical, chart, labs and discussed the procedure including the risks, benefits and alternatives for the proposed anesthesia with the patient or authorized representative who has indicated his/her understanding and acceptance.     Plan Discussed with: CRNA and Anesthesiologist  Anesthesia Plan Comments:         Anesthesia Quick Evaluation

## 2014-01-05 NOTE — Transfer of Care (Signed)
Immediate Anesthesia Transfer of Care Note  Patient: Travis [name]  Procedure(s) Performed: Procedure(s): LEFT 1ST STAGE BASCILIC VEIN TRANSPOSITION (Left)  Patient Location: PACU  Anesthesia Type:MAC  Level of Consciousness: awake, alert  and oriented  Airway & Oxygen Therapy: Patient Spontanous Breathing  Post-op Assessment: Report given to PACU RN and Post -op Vital signs reviewed and stable  Post vital signs: Reviewed and stable  Complications: No apparent anesthesia complications

## 2014-01-05 NOTE — Anesthesia Postprocedure Evaluation (Signed)
  Anesthesia Post-op Note  Patient: Travis [name]  Procedure(s) Performed: Procedure(s): LEFT 1ST STAGE BASCILIC VEIN TRANSPOSITION (Left)  Patient Location: PACU  Anesthesia Type:MAC  Level of Consciousness: awake  Airway and Oxygen Therapy: Patient Spontanous Breathing  Post-op Pain: mild  Post-op Assessment: Post-op Vital signs reviewed  Post-op Vital Signs: Reviewed  Complications: No apparent anesthesia complications

## 2014-01-06 ENCOUNTER — Encounter (HOSPITAL_COMMUNITY): Payer: Self-pay | Admitting: Vascular Surgery

## 2014-01-20 ENCOUNTER — Encounter (HOSPITAL_COMMUNITY): Payer: Self-pay | Admitting: Vascular Surgery

## 2014-02-18 ENCOUNTER — Encounter: Payer: BC Managed Care – PPO | Admitting: Vascular Surgery

## 2014-02-18 ENCOUNTER — Other Ambulatory Visit (HOSPITAL_COMMUNITY): Payer: BC Managed Care – PPO

## 2014-03-03 ENCOUNTER — Encounter: Payer: Self-pay | Admitting: Vascular Surgery

## 2014-03-04 ENCOUNTER — Ambulatory Visit (HOSPITAL_COMMUNITY)
Admission: RE | Admit: 2014-03-04 | Discharge: 2014-03-04 | Disposition: A | Discharge status: Home / Self Care | Payer: BC Managed Care – PPO | Source: Ambulatory Visit | Attending: Vascular Surgery | Admitting: Vascular Surgery

## 2014-03-04 ENCOUNTER — Ambulatory Visit (INDEPENDENT_AMBULATORY_CARE_PROVIDER_SITE_OTHER): Payer: BC Managed Care – PPO | Admitting: Vascular Surgery

## 2014-03-04 ENCOUNTER — Encounter: Payer: Self-pay | Admitting: Vascular Surgery

## 2014-03-04 VITALS — BP 152/91 | HR 80 | Ht 71.0 in | Wt 261.2 lb

## 2014-03-04 DIAGNOSIS — N186 End stage renal disease: Secondary | ICD-10-CM

## 2014-03-04 DIAGNOSIS — N185 Chronic kidney disease, stage 5: Secondary | ICD-10-CM

## 2014-03-04 DIAGNOSIS — Z48812 Encounter for surgical aftercare following surgery on the circulatory system: Secondary | ICD-10-CM | POA: Insufficient documentation

## 2014-03-04 NOTE — Progress Notes (Signed)
Postoperative Access Visit   History of Present Illness  Travis [name] is a 53 y.o. year old male who presents for postoperative follow-up for: L 1st BRVT (Date: 01/05/14).  The patient's wounds are healed.  The patient notes no steal symptoms.  The patient is able to complete their activities of daily living.  The patient's current symptoms are: none.  Past Medical History  Diagnosis Date  . Hypertension   . Chronic kidney disease   . Pneumonia     as a child  . Sleep apnea     uses c-pap  . GERD (gastroesophageal reflux disease)     takes Nexium    Past Surgical History  Procedure Laterality Date  . Mouth surgery    . Colonoscopy    . Bascilic vein transposition Left 01/05/2014    Procedure: LEFT 1ST STAGE BASCILIC VEIN TRANSPOSITION;  Surgeon: Conrad Hilliard, MD;  Location: [facility];  Service: Vascular;  Laterality: Left;    History   Social History  . Marital Status: Married    Spouse Name: N/A    Number of Children: N/A  . Years of Education: N/A   Occupational History  . Not on file.   Social History Main Topics  . Smoking status: Former Smoker    Quit date: 12/31/2012  . Smokeless tobacco: Never Used  . Alcohol Use: No  . Drug Use: No  . Sexual Activity: Not on file   Other Topics Concern  . Not on file   Social History Narrative  . No narrative on file    Family History  Problem Relation Age of Onset  . Hypertension Mother   . Hypertension Father   . Hypertension Sister      Current Outpatient Prescriptions on File Prior to Visit  Medication Sig Dispense Refill  . amLODipine (NORVASC) 10 MG tablet Take 10 mg by mouth daily.      . calcitRIOL (ROCALTROL) 0.25 MCG capsule Take 0.25 mcg by mouth every Monday, Wednesday, and Friday.       . esomeprazole (NEXIUM) 40 MG capsule Take 40 mg by mouth daily at 12 noon.      . multivitamin (RENA-VIT) TABS tablet Take 1 tablet by mouth daily.      Marland Kitchen oxyCODONE (ROXICODONE) 5 MG immediate release tablet  Take 1 tablet (5 mg total) by mouth every 4 (four) hours as needed for severe pain.  30 tablet  0  . quinapril (ACCUPRIL) 40 MG tablet Take 80 mg by mouth every morning.        No current facility-administered medications on file prior to visit.    Allergies  Allergen Reactions  . Ibuprofen Hives    REVIEW OF SYSTEMS:  (Positives checked otherwise negative)  CARDIOVASCULAR:  []  chest pain, []  chest pressure, []  palpitations, []  shortness of breath when laying flat, []  shortness of breath with exertion,  []  pain in feet when walking, []  pain in feet when laying flat, []  history of blood clot in veins (DVT), []  history of phlebitis, []  swelling in legs, []  varicose veins  PULMONARY:  []  productive cough, []  asthma, []  wheezing  NEUROLOGIC:  []  weakness in arms or legs, []  numbness in arms or legs, []  difficulty speaking or slurred speech, []  temporary loss of vision in one eye, []  dizziness  HEMATOLOGIC:  []  bleeding problems, []  problems with blood clotting too easily  MUSCULOSKEL:  []  joint pain, []  joint swelling  GASTROINTEST:  []  vomiting blood, []   blood in stool     GENITOURINARY:  []  burning with urination, []  blood in urine  PSYCHIATRIC:  []  history of major depression  INTEGUMENTARY:  []  rashes, []  ulcers  For VQI Use Only  PRE-ADM LIVING: Home  AMB STATUS: Ambulatory  Physical Examination Filed Vitals:   03/04/14 1452  BP: 152/91  Pulse: 80   Pulmonary: Sym exp, good air movt, CTAB, no rales, rhonchi, & wheezing  Cardiac: RRR, Nl S1, S2, no Murmurs, rubs or gallops  LUE: Incision is healed, skin feels warm, hand grip is 5/5, sensation in digits is intact, palpable thrill, bruit can be auscultated   L arm arterial duplex (03/04/2014)  Diameters: 5.6-13.9 mm  Depths: 0.3-2.4 cm  Medical Decision Making  Travis [name] is a 53 y.o. year old male who presents s/p L 1st stage BRVT.  The patient now needs mobilization of the fistula from deep in the arm,  i.e. Second stage.  He is going to contact us when he can arrange the leave to do that procedure.  Thank you for allowing [name] to participate in this patient's care.  Adele Barthel, MD [facility] Office: [phone] Pager: [phone]  03/04/2014, 3:20 PM

## 2014-03-29 ENCOUNTER — Other Ambulatory Visit (HOSPITAL_COMMUNITY): Payer: Self-pay | Admitting: *Deleted

## 2014-03-30 ENCOUNTER — Inpatient Hospital Stay (HOSPITAL_COMMUNITY): Admission: RE | Admit: 2014-03-30 | Payer: BC Managed Care – PPO | Source: Ambulatory Visit

## 2014-04-28 ENCOUNTER — Telehealth: Payer: Self-pay | Admitting: *Deleted

## 2014-04-28 NOTE — Telephone Encounter (Signed)
Called to ask the patient if he wants to schedule his surgery. He said he would like to. I told him carol would be calling him to see that up.

## 2014-05-16 ENCOUNTER — Other Ambulatory Visit (HOSPITAL_COMMUNITY): Payer: Self-pay | Admitting: *Deleted

## 2014-05-17 ENCOUNTER — Encounter (HOSPITAL_COMMUNITY)
Admission: RE | Admit: 2014-05-17 | Discharge: 2014-05-17 | Disposition: A | Discharge status: Home / Self Care | Payer: BC Managed Care – PPO | Source: Ambulatory Visit | Attending: Nephrology | Admitting: Nephrology

## 2014-05-17 DIAGNOSIS — D631 Anemia in chronic kidney disease: Secondary | ICD-10-CM | POA: Diagnosis present

## 2014-05-17 DIAGNOSIS — N184 Chronic kidney disease, stage 4 (severe): Secondary | ICD-10-CM | POA: Diagnosis not present

## 2014-05-17 MED ORDER — SODIUM CHLORIDE 0.9 % IV SOLN
510.0000 mg | INTRAVENOUS | Status: DC
  Administered 2014-05-17: 510 mg via INTRAVENOUS
  Filled 2014-05-17: qty 17

## 2014-05-17 NOTE — Discharge Instructions (Signed)

## 2014-05-27 ENCOUNTER — Encounter (HOSPITAL_COMMUNITY)
Admission: RE | Admit: 2014-05-27 | Discharge: 2014-05-27 | Disposition: A | Discharge status: Home / Self Care | Payer: BC Managed Care – PPO | Source: Ambulatory Visit | Attending: Nephrology | Admitting: Nephrology

## 2014-05-27 DIAGNOSIS — N039 Chronic nephritic syndrome with unspecified morphologic changes: Secondary | ICD-10-CM | POA: Diagnosis not present

## 2014-05-27 DIAGNOSIS — D631 Anemia in chronic kidney disease: Secondary | ICD-10-CM | POA: Diagnosis not present

## 2014-05-27 MED ORDER — SODIUM CHLORIDE 0.9 % IV SOLN
510.0000 mg | INTRAVENOUS | Status: DC
  Administered 2014-05-27: 510 mg via INTRAVENOUS
  Filled 2014-05-27: qty 17

## 2014-06-07 DIAGNOSIS — N184 Chronic kidney disease, stage 4 (severe): Secondary | ICD-10-CM | POA: Insufficient documentation

## 2014-06-07 DIAGNOSIS — D631 Anemia in chronic kidney disease: Secondary | ICD-10-CM | POA: Insufficient documentation

## 2014-06-07 DIAGNOSIS — N039 Chronic nephritic syndrome with unspecified morphologic changes: Principal | ICD-10-CM

## 2014-06-09 ENCOUNTER — Other Ambulatory Visit: Payer: Self-pay

## 2014-07-18 ENCOUNTER — Encounter (HOSPITAL_COMMUNITY): Payer: Self-pay

## 2014-07-18 ENCOUNTER — Encounter (HOSPITAL_COMMUNITY)
Admission: RE | Admit: 2014-07-18 | Discharge: 2014-07-18 | Disposition: A | Discharge status: Home / Self Care | Payer: BC Managed Care – PPO | Source: Ambulatory Visit | Attending: Vascular Surgery | Admitting: Vascular Surgery

## 2014-07-18 DIAGNOSIS — Z01812 Encounter for preprocedural laboratory examination: Secondary | ICD-10-CM | POA: Insufficient documentation

## 2014-07-18 DIAGNOSIS — Z01818 Encounter for other preprocedural examination: Secondary | ICD-10-CM | POA: Insufficient documentation

## 2014-07-18 NOTE — Pre-Procedure Instructions (Addendum)
MICKEAL DIRIENZO  07/18/2014   Your procedure is scheduled on:  07/26/14  Report to [facility] short stay admitting at 530 AM.  Call this number if you have problems the morning of surgery: [phone]   Remember:   Do not eat food or drink liquids after midnight.   Take these medicines the morning of surgery with A SIP OF WATER: amlodipine, nexium,pain med if needed      Take all meds as needed until day of surgery except as instructed below or per dr      Bridgette Habermann all herbel meds, nsaids (aleve,naproxen,advil,ibuprofen) 5 days prior to surgery(07/21/14) including vitamins    Do not wear jewelry, make-up or nail polish.  Do not wear lotions, powders, or perfumes. You may wear deodorant.  Do not shave 48 hours prior to surgery. Men may shave face and neck.  Do not bring valuables to the hospital.  [facility] is not responsible                  for any belongings or valuables.               Contacts, dentures or bridgework may not be worn into surgery.  Leave suitcase in the car. After surgery it may be brought to your room.  For patients admitted to the hospital, discharge time is determined by your                treatment team.               Patients discharged the day of surgery will not be allowed to drive  home.  Name and phone number of your driver:   Special Instructions:  Special Instructions: [place] - Preparing for Surgery  Before surgery, you can play an important role.  Because skin is not sterile, your skin needs to be as free of germs as possible.  You can reduce the number of germs on you skin by washing with CHG (chlorahexidine gluconate) soap before surgery.  CHG is an antiseptic cleaner which kills germs and bonds with the skin to continue killing germs even after washing.  Please DO NOT use if you have an allergy to CHG or antibacterial soaps.  If your skin becomes reddened/irritated stop using the CHG and inform your nurse when you arrive at Short Stay.  Do not  shave (including legs and underarms) for at least 48 hours prior to the first CHG shower.  You may shave your face.  Please follow these instructions carefully:   1.  Shower with CHG Soap the night before surgery and the morning of Surgery.  2.  If you choose to wash your hair, wash your hair first as usual with your normal shampoo.  3.  After you shampoo, rinse your hair and body thoroughly to remove the Shampoo.  4.  Use CHG as you would any other liquid soap.  You can apply chg directly  to the skin and wash gently with scrungie or a clean washcloth.  5.  Apply the CHG Soap to your body ONLY FROM THE NECK DOWN.  Do not use on open wounds or open sores.  Avoid contact with your eyes ears, mouth and genitals (private parts).  Wash genitals (private parts)       with your normal soap.  6.  Wash thoroughly, paying special attention to the area where your surgery will be performed.  7.  Thoroughly rinse your body with warm water from  the neck down.  8.  DO NOT shower/wash with your normal soap after using and rinsing off the CHG Soap.  9.  Pat yourself dry with a clean towel.            10.  Wear clean pajamas.            11.  Place clean sheets on your bed the night of your first shower and do not sleep with pets.  Day of Surgery  Do not apply any lotions/deodorants the morning of surgery.  Please wear clean clothes to the hospital/surgery center.   Please read over the following fact sheets that you were given: Pain Booklet, Coughing and Deep Breathing and Surgical Site Infection Prevention

## 2014-07-25 MED ORDER — SODIUM CHLORIDE 0.9 % IV SOLN
INTRAVENOUS | Status: DC
  Administered 2014-07-26: 07:00:00 via INTRAVENOUS

## 2014-07-25 MED ORDER — DEXTROSE 5 % IV SOLN
1.5000 g | INTRAVENOUS | Status: AC
  Administered 2014-07-26: 1.5 g via INTRAVENOUS
  Filled 2014-07-25: qty 1.5

## 2014-07-26 ENCOUNTER — Ambulatory Visit (HOSPITAL_COMMUNITY): Payer: BC Managed Care – PPO | Admitting: Anesthesiology

## 2014-07-26 ENCOUNTER — Encounter (HOSPITAL_COMMUNITY): Payer: BC Managed Care – PPO | Admitting: Anesthesiology

## 2014-07-26 ENCOUNTER — Ambulatory Visit (HOSPITAL_COMMUNITY)
Admission: RE | Admit: 2014-07-26 | Discharge: 2014-07-26 | Disposition: A | Discharge status: Home / Self Care | Payer: BC Managed Care – PPO | Source: Ambulatory Visit | Attending: Vascular Surgery | Admitting: Vascular Surgery

## 2014-07-26 ENCOUNTER — Encounter (HOSPITAL_COMMUNITY)
Admission: RE | Disposition: A | Discharge status: Home / Self Care | Payer: Self-pay | Source: Ambulatory Visit | Attending: Vascular Surgery

## 2014-07-26 ENCOUNTER — Encounter (HOSPITAL_COMMUNITY): Payer: Self-pay | Admitting: Surgery

## 2014-07-26 ENCOUNTER — Telehealth: Payer: Self-pay | Admitting: Vascular Surgery

## 2014-07-26 DIAGNOSIS — Z888 Allergy status to other drugs, medicaments and biological substances status: Secondary | ICD-10-CM | POA: Insufficient documentation

## 2014-07-26 DIAGNOSIS — G4730 Sleep apnea, unspecified: Secondary | ICD-10-CM | POA: Diagnosis not present

## 2014-07-26 DIAGNOSIS — N184 Chronic kidney disease, stage 4 (severe): Secondary | ICD-10-CM

## 2014-07-26 DIAGNOSIS — N185 Chronic kidney disease, stage 5: Secondary | ICD-10-CM

## 2014-07-26 DIAGNOSIS — I120 Hypertensive chronic kidney disease with stage 5 chronic kidney disease or end stage renal disease: Secondary | ICD-10-CM | POA: Diagnosis not present

## 2014-07-26 DIAGNOSIS — K219 Gastro-esophageal reflux disease without esophagitis: Secondary | ICD-10-CM | POA: Diagnosis not present

## 2014-07-26 DIAGNOSIS — Z87891 Personal history of nicotine dependence: Secondary | ICD-10-CM | POA: Insufficient documentation

## 2014-07-26 DIAGNOSIS — Z886 Allergy status to analgesic agent status: Secondary | ICD-10-CM | POA: Diagnosis not present

## 2014-07-26 HISTORY — PX: BASCILIC VEIN TRANSPOSITION: SHX5742

## 2014-07-26 LAB — POCT I-STAT 4, (NA,K, GLUC, HGB,HCT)
GLUCOSE: 97 mg/dL (ref 70–99)
HCT: 27 % — ABNORMAL LOW (ref 39.0–52.0)
HEMOGLOBIN: 9.2 g/dL — AB (ref 13.0–17.0)
POTASSIUM: 4.3 meq/L (ref 3.7–5.3)
Sodium: 143 mEq/L (ref 137–147)

## 2014-07-26 SURGERY — TRANSPOSITION, VEIN, BASILIC
Anesthesia: General | Site: Arm Upper | Laterality: Left

## 2014-07-26 MED ORDER — PROPOFOL 10 MG/ML IV BOLUS
INTRAVENOUS | Status: AC
  Filled 2014-07-26: qty 20

## 2014-07-26 MED ORDER — MIDAZOLAM HCL 5 MG/5ML IJ SOLN
INTRAMUSCULAR | Status: DC | PRN
  Administered 2014-07-26: 2 mg via INTRAVENOUS

## 2014-07-26 MED ORDER — DEXAMETHASONE SODIUM PHOSPHATE 4 MG/ML IJ SOLN
INTRAMUSCULAR | Status: AC
  Filled 2014-07-26: qty 2

## 2014-07-26 MED ORDER — DEXAMETHASONE SODIUM PHOSPHATE 10 MG/ML IJ SOLN
INTRAMUSCULAR | Status: DC | PRN
  Administered 2014-07-26: 8 mg via INTRAVENOUS

## 2014-07-26 MED ORDER — THROMBIN 20000 UNITS EX SOLR
CUTANEOUS | Status: AC
  Filled 2014-07-26: qty 20000

## 2014-07-26 MED ORDER — SODIUM CHLORIDE 0.9 % IR SOLN
Status: DC | PRN
  Administered 2014-07-26: 07:00:00

## 2014-07-26 MED ORDER — ONDANSETRON HCL 4 MG/2ML IJ SOLN
INTRAMUSCULAR | Status: AC
  Filled 2014-07-26: qty 2

## 2014-07-26 MED ORDER — ROCURONIUM BROMIDE 50 MG/5ML IV SOLN
INTRAVENOUS | Status: AC
  Filled 2014-07-26: qty 1

## 2014-07-26 MED ORDER — PROPOFOL 10 MG/ML IV BOLUS
INTRAVENOUS | Status: DC | PRN
  Administered 2014-07-26: 200 mg via INTRAVENOUS

## 2014-07-26 MED ORDER — FENTANYL CITRATE 0.05 MG/ML IJ SOLN
INTRAMUSCULAR | Status: AC
  Filled 2014-07-26: qty 5

## 2014-07-26 MED ORDER — LIDOCAINE HCL (CARDIAC) 20 MG/ML IV SOLN
INTRAVENOUS | Status: DC | PRN
  Administered 2014-07-26: 60 mg via INTRAVENOUS

## 2014-07-26 MED ORDER — MIDAZOLAM HCL 2 MG/2ML IJ SOLN
INTRAMUSCULAR | Status: AC
  Filled 2014-07-26: qty 2

## 2014-07-26 MED ORDER — ROCURONIUM BROMIDE 100 MG/10ML IV SOLN
INTRAVENOUS | Status: DC | PRN
  Administered 2014-07-26: 50 mg via INTRAVENOUS

## 2014-07-26 MED ORDER — HYDROMORPHONE HCL PF 1 MG/ML IJ SOLN
INTRAMUSCULAR | Status: AC
  Filled 2014-07-26: qty 1

## 2014-07-26 MED ORDER — LIDOCAINE HCL (CARDIAC) 20 MG/ML IV SOLN
INTRAVENOUS | Status: AC
  Filled 2014-07-26: qty 5

## 2014-07-26 MED ORDER — CHLORHEXIDINE GLUCONATE CLOTH 2 % EX PADS: 6.0000 | MEDICATED_PAD | Freq: Once | CUTANEOUS | Status: DC

## 2014-07-26 MED ORDER — NEOSTIGMINE METHYLSULFATE 10 MG/10ML IV SOLN
INTRAVENOUS | Status: AC
  Filled 2014-07-26: qty 1

## 2014-07-26 MED ORDER — OXYCODONE HCL 5 MG PO TABS: 5.0000 mg | ORAL_TABLET | ORAL | Status: DC | PRN

## 2014-07-26 MED ORDER — HYDROMORPHONE HCL PF 1 MG/ML IJ SOLN
0.2500 mg | INTRAMUSCULAR | Status: DC | PRN
  Administered 2014-07-26 (×2): 0.5 mg via INTRAVENOUS

## 2014-07-26 MED ORDER — FENTANYL CITRATE 0.05 MG/ML IJ SOLN
INTRAMUSCULAR | Status: DC | PRN
  Administered 2014-07-26: 150 ug via INTRAVENOUS
  Administered 2014-07-26: 100 ug via INTRAVENOUS

## 2014-07-26 MED ORDER — LIDOCAINE-EPINEPHRINE (PF) 1 %-1:200000 IJ SOLN
INTRAMUSCULAR | Status: AC
  Filled 2014-07-26: qty 10

## 2014-07-26 MED ORDER — OXYCODONE HCL 5 MG/5ML PO SOLN: 5.0000 mg | Freq: Once | ORAL | Status: DC | PRN

## 2014-07-26 MED ORDER — OXYCODONE HCL 5 MG PO TABS: 5.0000 mg | ORAL_TABLET | Freq: Once | ORAL | Status: DC | PRN

## 2014-07-26 MED ORDER — LIDOCAINE-EPINEPHRINE (PF) 1 %-1:200000 IJ SOLN
INTRAMUSCULAR | Status: DC | PRN
  Administered 2014-07-26: 30 mL

## 2014-07-26 MED ORDER — 0.9 % SODIUM CHLORIDE (POUR BTL) OPTIME
TOPICAL | Status: DC | PRN
  Administered 2014-07-26: 1000 mL

## 2014-07-26 MED ORDER — GLYCOPYRROLATE 0.2 MG/ML IJ SOLN
INTRAMUSCULAR | Status: DC | PRN
  Administered 2014-07-26: 0.4 mg via INTRAVENOUS

## 2014-07-26 MED ORDER — BUPIVACAINE HCL (PF) 0.5 % IJ SOLN
INTRAMUSCULAR | Status: DC | PRN
  Administered 2014-07-26: 30 mL

## 2014-07-26 MED ORDER — ONDANSETRON HCL 4 MG/2ML IJ SOLN
INTRAMUSCULAR | Status: DC | PRN
  Administered 2014-07-26: 4 mg via INTRAVENOUS

## 2014-07-26 MED ORDER — NEOSTIGMINE METHYLSULFATE 10 MG/10ML IV SOLN
INTRAVENOUS | Status: DC | PRN
  Administered 2014-07-26: 3 mg via INTRAVENOUS

## 2014-07-26 MED ORDER — SURGIFOAM 100 EX MISC
CUTANEOUS | Status: DC | PRN
  Administered 2014-07-26 (×2): via TOPICAL

## 2014-07-26 MED ORDER — GLYCOPYRROLATE 0.2 MG/ML IJ SOLN
INTRAMUSCULAR | Status: AC
  Filled 2014-07-26: qty 3

## 2014-07-26 SURGICAL SUPPLY — 46 items
ADH SKN CLS APL DERMABOND .7 (GAUZE/BANDAGES/DRESSINGS) ×2
BLADE 10 SAFETY STRL DISP (BLADE) ×3 IMPLANT
CANISTER SUCTION 2500CC (MISCELLANEOUS) ×3 IMPLANT
CLIP TI MEDIUM 24 (CLIP) ×3 IMPLANT
CLIP TI WIDE RED SMALL 24 (CLIP) ×3 IMPLANT
CORDS BIPOLAR (ELECTRODE) IMPLANT
COVER PROBE W GEL 5X96 (DRAPES) IMPLANT
COVER SURGICAL LIGHT HANDLE (MISCELLANEOUS) ×3 IMPLANT
DECANTER SPIKE VIAL GLASS SM (MISCELLANEOUS) ×3 IMPLANT
DERMABOND ADVANCED (GAUZE/BANDAGES/DRESSINGS) ×4
DERMABOND ADVANCED .7 DNX12 (GAUZE/BANDAGES/DRESSINGS) ×1 IMPLANT
ELECT REM PT RETURN 9FT ADLT (ELECTROSURGICAL) ×3
ELECTRODE REM PT RTRN 9FT ADLT (ELECTROSURGICAL) ×1 IMPLANT
GLOVE BIO SURGEON STRL SZ7 (GLOVE) ×3 IMPLANT
GLOVE BIOGEL PI IND STRL 6 (GLOVE) IMPLANT
GLOVE BIOGEL PI IND STRL 6.5 (GLOVE) IMPLANT
GLOVE BIOGEL PI IND STRL 7.5 (GLOVE) ×1 IMPLANT
GLOVE BIOGEL PI INDICATOR 6 (GLOVE) ×2
GLOVE BIOGEL PI INDICATOR 6.5 (GLOVE) ×4
GLOVE BIOGEL PI INDICATOR 7.5 (GLOVE) ×2
GLOVE ECLIPSE 6.5 STRL STRAW (GLOVE) ×2 IMPLANT
GLOVE SKINSENSE NS SZ7.0 (GLOVE) ×2
GLOVE SKINSENSE STRL SZ7.0 (GLOVE) IMPLANT
GOWN BRE IMP SLV AUR XL STRL (GOWN DISPOSABLE) ×4 IMPLANT
GOWN STRL REUS W/ TWL LRG LVL3 (GOWN DISPOSABLE) ×3 IMPLANT
GOWN STRL REUS W/TWL LRG LVL3 (GOWN DISPOSABLE) ×9
KIT BASIN OR (CUSTOM PROCEDURE TRAY) ×3 IMPLANT
KIT ROOM TURNOVER OR (KITS) ×3 IMPLANT
NS IRRIG 1000ML POUR BTL (IV SOLUTION) ×3 IMPLANT
PACK CV ACCESS (CUSTOM PROCEDURE TRAY) ×3 IMPLANT
PAD ARMBOARD 7.5X6 YLW CONV (MISCELLANEOUS) ×6 IMPLANT
SPONGE SURGIFOAM ABS GEL 100 (HEMOSTASIS) ×4 IMPLANT
SUT MNCRL AB 4-0 PS2 18 (SUTURE) ×3 IMPLANT
SUT PROLENE 6 0 BV (SUTURE) ×3 IMPLANT
SUT PROLENE 7 0 BV 1 (SUTURE) ×2 IMPLANT
SUT SILK 2 0 SH (SUTURE) ×3 IMPLANT
SUT SILK 3 0 (SUTURE) ×3
SUT SILK 3-0 18XBRD TIE 12 (SUTURE) IMPLANT
SUT VIC AB 2-0 CT1 27 (SUTURE) ×3
SUT VIC AB 2-0 CT1 TAPERPNT 27 (SUTURE) ×1 IMPLANT
SUT VIC AB 3-0 SH 27 (SUTURE) ×15
SUT VIC AB 3-0 SH 27X BRD (SUTURE) ×2 IMPLANT
TOWEL OR 17X24 6PK STRL BLUE (TOWEL DISPOSABLE) ×3 IMPLANT
TOWEL OR 17X26 10 PK STRL BLUE (TOWEL DISPOSABLE) ×3 IMPLANT
UNDERPAD 30X30 INCONTINENT (UNDERPADS AND DIAPERS) ×3 IMPLANT
WATER STERILE IRR 1000ML POUR (IV SOLUTION) ×3 IMPLANT

## 2014-07-26 NOTE — Anesthesia Postprocedure Evaluation (Signed)
  Anesthesia Post-op Note  Patient: Travis [name]  Procedure(s) Performed: Procedure(s): Left Arm Brachial Vein Transposition Second Stage (Left)  Patient Location: PACU  Anesthesia Type:General  Level of Consciousness: awake and alert   Airway and Oxygen Therapy: Patient Spontanous Breathing  Post-op Pain: moderate  Post-op Assessment: Post-op Vital signs reviewed, Patient's Cardiovascular Status Stable and Respiratory Function Stable  Post-op Vital Signs: Reviewed  Filed Vitals:   07/26/14 1145  BP: 143/68  Pulse: 89  Temp:   Resp: 12    Complications: No apparent anesthesia complications

## 2014-07-26 NOTE — Discharge Instructions (Addendum)

## 2014-07-26 NOTE — Transfer of Care (Signed)
Immediate Anesthesia Transfer of Care Note  Patient: Travis [name]  Procedure(s) Performed: Procedure(s): Left Arm Brachial Vein Transposition Second Stage (Left)  Patient Location: PACU  Anesthesia Type:General  Level of Consciousness: awake, alert , oriented and patient cooperative  Airway & Oxygen Therapy: Patient Spontanous Breathing and Patient connected to nasal cannula oxygen  Post-op Assessment: Report given to PACU RN and Post -op Vital signs reviewed and stable  Post vital signs: Reviewed and stable  Complications: No apparent anesthesia complications

## 2014-07-26 NOTE — Op Note (Signed)
    OPERATIVE NOTE   PROCEDURE: left second stage brachial vein transposition (brachiobrachial arteriovenous fistula) placement  PRE-OPERATIVE DIAGNOSIS: chronic kidney disease stage IV-V   POST-OPERATIVE DIAGNOSIS: same as above   SURGEON: Adele Barthel, MD  ANESTHESIA: general  ESTIMATED BLOOD LOSS: 30 cc  FINDING(S): 1.  Brachial vein transposition >6 mm throughout with strong thrill  SPECIMEN(S):  none  INDICATIONS:   Travis [name] is a 53 y.o. male who presents with chronic kidney disease stage IV-V s/p successful L first stage brachial vein transposition.  The patient is scheduled for left second stage brachial vein transposition.  The patient is aware the risks include but are not limited to: bleeding, infection, steal syndrome, nerve damage, ischemic monomelic neuropathy, failure to mature, and need for additional procedures.  The patient is aware of the risks of the procedure and elects to proceed forward.  DESCRIPTION: After full informed written consent was obtained from the patient, the patient was brought back to the operating room and placed supine upon the operating table.  Prior to induction, the patient received IV antibiotics.   After obtaining adequate anesthesia, the patient was then prepped and draped in the standard fashion for a left arm access procedure.  I turned my attention first to identifying the patient's brachiobrachial arteriovenous fistula.  Using SonoSite guidance, the location of this fistula was marked out on the skin.  I made an longitudinal incision over the fistula from its arterial anastomosis up to its axillary extent.  I carefully dissected the fistula away from its adjacent nerves.  Eventually the entirety of this fistula was mobilized and I dissected a plane on top of the bicipital fascia with electrocautery.  The fistula was secured in its new location with interrupted 3-0 Vicryl stitches securing side branches in the fistula to the biciptal  fascia.  The deep subcutaneous tissue was inspected for bleeding.  Bleeding was controlled with electrocautery and placement of large pieces of thrombin and gelfoam.  I washed out the surgical site after waiting a few minutes, and there was no further bleeding.  The deep subcutaneous tissue was reapproximated with mattress sutures of 3-0 Vicryl to eliminate some of the dead space.  The superficial subcutaneous tissue was then reapproximated along the incision line with a running stitch of 3-0 Vicryl.  The skin was then reapproximated with a running subcuticular of 4-0 Monocryl.  The skin was then cleaned, dried, and reinforced with Dermabond.  The patient tolerated this procedure well.   COMPLICATIONS: none  CONDITION: stable  Adele Barthel, MD [facility] Office: [phone] Pager: [phone]  07/26/2014, 9:49 AM

## 2014-07-26 NOTE — H&P (Signed)
Brief History and Physical  History of Present Illness  Travis [name] is a 53 y.o. male who presents with chief complaint: chronic kidney disease stage IV.  The patient presents today for L 2nd stage BRVT.    Past Medical History  Diagnosis Date  . Hypertension   . Chronic kidney disease   . Pneumonia     as a child  . GERD (gastroesophageal reflux disease)     takes Nexium  . Sleep apnea     uses c-pap 2 yrs    Past Surgical History  Procedure Laterality Date  . Mouth surgery      teeth cleaning  . Colonoscopy    . Bascilic vein transposition Left 01/05/2014    Procedure: LEFT 1ST STAGE BASCILIC VEIN TRANSPOSITION;  Surgeon: Conrad Limestone, MD;  Location: [facility];  Service: Vascular;  Laterality: Left;    History   Social History  . Marital Status: Married    Spouse Name: N/A    Number of Children: N/A  . Years of Education: N/A   Occupational History  . Not on file.   Social History Main Topics  . Smoking status: Former Smoker -- 4 years    Types: Cigars    Quit date: 12/31/2012  . Smokeless tobacco: Never Used  . Alcohol Use: No  . Drug Use: No  . Sexual Activity: Not on file   Other Topics Concern  . Not on file   Social History Narrative  . No narrative on file    Family History  Problem Relation Age of Onset  . Hypertension Mother   . Hypertension Father   . Hypertension Sister     No current facility-administered medications on file prior to encounter.   Current Outpatient Prescriptions on File Prior to Encounter  Medication Sig Dispense Refill  . amLODipine (NORVASC) 10 MG tablet Take 10 mg by mouth daily.      . calcitRIOL (ROCALTROL) 0.25 MCG capsule Take 0.25 mcg by mouth every Monday, Wednesday, and Friday.       . multivitamin (RENA-VIT) TABS tablet Take 1 tablet by mouth daily.      Marland Kitchen esomeprazole (NEXIUM) 40 MG capsule Take 40 mg by mouth daily as needed (for acid reflux).         Allergies  Allergen Reactions  . Ibuprofen  Hives  . Lisinopril Swelling    PT states he is allergic to all prils; caused facial swelling  . Naproxen Hives and Other (See Comments)    Alleve causes patient to have hives    Review of Systems: As listed above, otherwise negative.  Physical Examination  Filed Vitals:   07/26/14 0603  BP: 167/92  Pulse: 78  Temp: 98.1 F (36.7 C)  TempSrc: Oral  Resp: 20  SpO2: 97%    General: A&O x 3, WDWN  Pulmonary: Sym exp, good air movt, CTAB, no rales, rhonchi, & wheezing  Cardiac: RRR, Nl S1, S2, no Murmurs, rubs or gallops  Gastrointestinal: soft, NTND, -G/R, - HSM, - masses, - CVAT B  Musculoskeletal: M/S 5/5 throughout , Extremities without ischemic changes , palpable thrill, +bruit Laboratory See iStat  Medical Decision Making  Travis [name] is a 53 y.o. male who presents with: successful L 1st stage BRVT, chronic kidney disease stage IV.   The patient is scheduled for: L 2nd stage BRVT  Risk, benefits, and alternatives to access surgery were discussed.  The patient is aware the risks include  but are not limited to: bleeding, infection, steal syndrome, nerve damage, ischemic monomelic neuropathy, failure to mature, and need for additional procedures.  The patient is aware of the risks and agrees to proceed.  Adele Barthel, MD [facility] Office: [phone] Pager: [phone]  07/26/2014, 7:20 AM

## 2014-07-26 NOTE — Telephone Encounter (Addendum)
Message copied by Gena Fray on Tue Jul 26, 2014  3:54 PM ------      Message from: Denman George      Created: Tue Jul 26, 2014 11:13 AM      Regarding: [name]; also needs 4 wk. f/u with [facility]                   ----- Message -----         From: Conrad Daniels, MD         Sent: 07/26/2014   9:52 AM           To: [facility] Charge [address]            [name]      [id number]      1961-08-01            PROCEDURE:      left second stage brachial vein transposition (brachiobrachial arteriovenous fistula) placement            Follow-up: 4 weeks             ------  07/26/14: pts vm is full, mailed letter, dpm

## 2014-07-26 NOTE — Anesthesia Preprocedure Evaluation (Signed)
Anesthesia Evaluation  Patient identified by MRN, date of birth, ID band Patient awake    Reviewed: Allergy & Precautions, H&P , NPO status , Patient's Chart, lab work & pertinent test results  Airway Mallampati: II TM Distance: >3 FB Neck ROM: Full    Dental no notable dental hx. (+) Teeth Intact, Dental Advisory Given   Pulmonary sleep apnea and Continuous Positive Airway Pressure Ventilation , former smoker,  breath sounds clear to auscultation  Pulmonary exam normal       Cardiovascular hypertension, Pt. on medications Rhythm:Regular Rate:Normal     Neuro/Psych negative neurological ROS  negative psych ROS   GI/Hepatic Neg liver ROS, GERD-  Medicated and Controlled,  Endo/Other  negative endocrine ROS  Renal/GU Renal InsufficiencyRenal disease  negative genitourinary   Musculoskeletal   Abdominal   Peds  Hematology negative hematology ROS (+)   Anesthesia Other Findings   Reproductive/Obstetrics negative OB ROS                           Anesthesia Physical Anesthesia Plan  ASA: III  Anesthesia Plan: General   Post-op Pain Management:    Induction: Intravenous  Airway Management Planned: Oral ETT  Additional Equipment:   Intra-op Plan:   Post-operative Plan: Extubation in OR  Informed Consent: I have reviewed the patients History and Physical, chart, labs and discussed the procedure including the risks, benefits and alternatives for the proposed anesthesia with the patient or authorized representative who has indicated his/her understanding and acceptance.   Dental advisory given  Plan Discussed with: CRNA  Anesthesia Plan Comments:         Anesthesia Quick Evaluation

## 2014-07-27 ENCOUNTER — Encounter (HOSPITAL_COMMUNITY): Payer: Self-pay | Admitting: Vascular Surgery

## 2014-08-11 ENCOUNTER — Telehealth: Payer: Self-pay

## 2014-08-11 ENCOUNTER — Ambulatory Visit (INDEPENDENT_AMBULATORY_CARE_PROVIDER_SITE_OTHER): Payer: BC Managed Care – PPO | Admitting: Family

## 2014-08-11 ENCOUNTER — Encounter: Payer: Self-pay | Admitting: Family

## 2014-08-11 VITALS — BP 151/97 | HR 85 | Temp 97.4°F | Resp 16 | Ht 71.0 in | Wt 248.0 lb

## 2014-08-11 DIAGNOSIS — T148XXA Other injury of unspecified body region, initial encounter: Secondary | ICD-10-CM

## 2014-08-11 DIAGNOSIS — L24A9 Irritant contact dermatitis due friction or contact with other specified body fluids: Secondary | ICD-10-CM

## 2014-08-11 NOTE — Telephone Encounter (Signed)
Pt. called to report that approx. a 2" area at distal end of left upper arm incision has opened-up.   Denies redness or fever.  Stated there has been small amt. of "red-yellow drainage" on bandage, when he changes it.  Appt. given for incision check today @ 2:40 PM.  Agrees with plan.

## 2014-08-11 NOTE — Progress Notes (Signed)
Established Dialysis Access  History of Present Illness  Travis [name] is a 53 y.o. ([birth year]) male patient of Dr. Bridgett Larsson who presents s/p Left BVT 2nd stage on 07-26-14, with c/o open incision sitefor 1-2 weeks. Pt states the surgical glue gradually came off and he noted the left upper arm medial aspect incision was not proximated at the distal half, he denies drainage, denies pain, and states the swelling has decreased over time. Pt denies fever or chills, denies steal symptoms in left hand/forearm.  The patient is right hand dominant.  No previous access procedures have been attempted.   He is not yet needing dialysis.  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  Past Medical History  Diagnosis Date  . Hypertension   . Chronic kidney disease   . Pneumonia     as a child  . GERD (gastroesophageal reflux disease)     takes Nexium  . Sleep apnea     uses c-pap 2 yrs    Social History History  Substance Use Topics  . Smoking status: Former Smoker -- 4 years    Types: Cigars    Quit date: 12/31/2012  . Smokeless tobacco: Never Used  . Alcohol Use: No    Family History Family History  Problem Relation Age of Onset  . Hypertension Mother   . Hypertension Father   . Hypertension Sister     Surgical History Past Surgical History  Procedure Laterality Date  . Mouth surgery      teeth cleaning  . Colonoscopy    . Bascilic vein transposition Left 01/05/2014    Procedure: LEFT 1ST STAGE BASCILIC VEIN TRANSPOSITION;  Surgeon: Conrad Flourtown, MD;  Location: [place];  Service: Vascular;  Laterality: Left;  . Bascilic vein transposition Left 07/26/2014    Procedure: Left Arm Brachial Vein Transposition Second Stage;  Surgeon: Conrad Shively, MD;  Location: [place];  Service: Vascular;  Laterality: Left;    Allergies    Allergen Reactions  . Ibuprofen Hives  . Lisinopril Swelling    PT states he is allergic to all prils; caused facial swelling  . Naproxen Hives and Other (See Comments)    Alleve causes patient to have hives    Current Outpatient Prescriptions  Medication Sig Dispense Refill  . amLODipine (NORVASC) 10 MG tablet Take 10 mg by mouth daily.      . calcitRIOL (ROCALTROL) 0.25 MCG capsule Take 0.25 mcg by mouth every Monday, Wednesday, and Friday.       . hydrALAZINE (APRESOLINE) 10 MG tablet Take 10 mg by mouth 4 (four) times daily.      . multivitamin (RENA-VIT) TABS tablet Take 1 tablet by mouth daily.      Marland Kitchen oxyCODONE (ROXICODONE) 5 MG immediate release tablet Take 1 tablet (5 mg total) by mouth every 4 (four) hours as needed for severe pain.  30 tablet  0  . esomeprazole (NEXIUM) 40 MG capsule Take 40 mg by mouth daily as needed (for acid reflux).        No current facility-administered medications for this visit.     REVIEW OF SYSTEMS: see HPI for pertinent positives and negatives    PHYSICAL EXAMINATION:  Filed Vitals:   08/11/14 1503  BP: 151/97  Pulse: 85  Temp: 97.4 F (36.3 C)  TempSrc: Oral  Resp: 16  Height: 5\' 11"  (1.803 m)  Weight: 248 lb (112.492 kg)  SpO2: 99%   Body mass index is 34.6 kg/(m^2).  General: Obese male appears his stated age.  HEENT:  No gross abnormalities Pulmonary: Respirations are non-labored. Musculoskeletal: There are no major deformities.   Neurologic: No focal weakness or paresthesias are detected, Skin: There are no ulcers or rashes noted. Left upper arm incision is healing well, no erythema, no drainage, minimal swelling. Incision edges at distal half of incision are slightly separated, good granulation bed. Psychiatric: The patient has normal affect. Cardiovascular: There is a regular rate and rhythm without significant murmur appreciated. Bilateral radial  Pulses are 2+ palpable, palpable thrill at left upper arm AV fistula.    Medical Decision Making  Travis [name] is a 53 y.o. male who is s/p Left BVT 2nd stage on 07-26-14, with c/o open incision site. Distal half of incision is slightly separated but has no signs of infection, should granulate well. Daily cleansing with soap and water to incision, light daily dressing change until adequate granulation occurs. Dr. Bridgett Larsson spoke with and examined patient's left arm. Follow up in 1-2 weeks for post operative check with Dr. Bridgett Larsson. Patient advised to call for any further concerns.   Travis [name], Sharmon Leyden, RN, MSN, FNP-C [facility] Office: [phone]  08/11/2014, 3:25 PM  Clinic MD: Bridgett Larsson

## 2014-08-18 ENCOUNTER — Other Ambulatory Visit (HOSPITAL_COMMUNITY): Payer: Self-pay | Admitting: *Deleted

## 2014-08-19 ENCOUNTER — Inpatient Hospital Stay (HOSPITAL_COMMUNITY): Admission: RE | Admit: 2014-08-19 | Payer: BC Managed Care – PPO | Source: Ambulatory Visit

## 2014-08-25 ENCOUNTER — Encounter: Payer: Self-pay | Admitting: Vascular Surgery

## 2014-08-26 ENCOUNTER — Encounter: Payer: Self-pay | Admitting: Vascular Surgery

## 2014-08-26 ENCOUNTER — Ambulatory Visit (INDEPENDENT_AMBULATORY_CARE_PROVIDER_SITE_OTHER): Payer: BC Managed Care – PPO | Admitting: Vascular Surgery

## 2014-08-26 VITALS — BP 161/96 | HR 96 | Ht 71.0 in | Wt 257.0 lb

## 2014-08-26 DIAGNOSIS — N185 Chronic kidney disease, stage 5: Secondary | ICD-10-CM

## 2014-08-26 DIAGNOSIS — N186 End stage renal disease: Secondary | ICD-10-CM | POA: Insufficient documentation

## 2014-08-26 NOTE — Progress Notes (Signed)
    Postoperative Access Visit   History of Present Illness  Travis [name] is a 53 y.o. year old male who presents for postoperative follow-up for: L 2nd BRVT (Date: 07/26/14).  The patient's wounds are healed.  The patient notes no steal symptoms.  The patient is able to complete their activities of daily living.  The patient's current symptoms are: none.  For VQI Use Only  PRE-ADM LIVING: Home  AMB STATUS: Ambulatory  Physical Examination Filed Vitals:   08/26/14 1441  BP: 161/96  Pulse: 96    LUE: Incision is healed, skin feels warm, hand grip is 5/5, sensation in digits is intact, +palpable thrill, bruit can be auscultated   Medical Decision Making  TERALD [name] is a 53 y.o. year old male who presents s/p L 2nd BRVT.  The patient's access is ready for use.  Thank you for allowing [name] to participate in this patient's care.  Adele Barthel, MD [facility] Office: [phone] Pager: [phone]  08/26/2014, 3:07 PM

## 2014-09-12 ENCOUNTER — Encounter (HOSPITAL_COMMUNITY)
Admission: RE | Admit: 2014-09-12 | Discharge: 2014-09-12 | Disposition: A | Discharge status: Home / Self Care | Payer: BC Managed Care – PPO | Source: Ambulatory Visit | Attending: Nephrology | Admitting: Nephrology

## 2014-09-12 DIAGNOSIS — D509 Iron deficiency anemia, unspecified: Secondary | ICD-10-CM | POA: Insufficient documentation

## 2014-09-12 MED ORDER — SODIUM CHLORIDE 0.9 % IV SOLN
510.0000 mg | INTRAVENOUS | Status: DC
  Administered 2014-09-12: 510 mg via INTRAVENOUS
  Filled 2014-09-12: qty 17

## 2014-09-19 ENCOUNTER — Encounter (HOSPITAL_COMMUNITY)
Admission: RE | Admit: 2014-09-19 | Discharge: 2014-09-19 | Disposition: A | Discharge status: Home / Self Care | Payer: BC Managed Care – PPO | Source: Ambulatory Visit | Attending: Nephrology | Admitting: Nephrology

## 2014-09-19 DIAGNOSIS — D509 Iron deficiency anemia, unspecified: Secondary | ICD-10-CM | POA: Diagnosis not present

## 2014-09-19 MED ORDER — SODIUM CHLORIDE 0.9 % IV SOLN
510.0000 mg | INTRAVENOUS | Status: DC
  Administered 2014-09-19: 510 mg via INTRAVENOUS
  Filled 2014-09-19: qty 17

## 2015-01-30 ENCOUNTER — Emergency Department (HOSPITAL_COMMUNITY): Payer: BLUE CROSS/BLUE SHIELD

## 2015-01-30 ENCOUNTER — Encounter (HOSPITAL_COMMUNITY): Payer: Self-pay | Admitting: Emergency Medicine

## 2015-01-30 ENCOUNTER — Inpatient Hospital Stay (HOSPITAL_COMMUNITY)
Admission: EM | Admit: 2015-01-30 | Discharge: 2015-02-02 | DRG: 378 | Disposition: A | Discharge status: Home / Self Care | Payer: BLUE CROSS/BLUE SHIELD | Attending: Internal Medicine | Admitting: Internal Medicine

## 2015-01-30 DIAGNOSIS — N179 Acute kidney failure, unspecified: Secondary | ICD-10-CM

## 2015-01-30 DIAGNOSIS — D500 Iron deficiency anemia secondary to blood loss (chronic): Secondary | ICD-10-CM | POA: Diagnosis present

## 2015-01-30 DIAGNOSIS — R079 Chest pain, unspecified: Secondary | ICD-10-CM | POA: Diagnosis present

## 2015-01-30 DIAGNOSIS — D638 Anemia in other chronic diseases classified elsewhere: Secondary | ICD-10-CM

## 2015-01-30 DIAGNOSIS — K2980 Duodenitis without bleeding: Secondary | ICD-10-CM | POA: Diagnosis present

## 2015-01-30 DIAGNOSIS — K219 Gastro-esophageal reflux disease without esophagitis: Secondary | ICD-10-CM | POA: Diagnosis present

## 2015-01-30 DIAGNOSIS — K921 Melena: Principal | ICD-10-CM | POA: Diagnosis present

## 2015-01-30 DIAGNOSIS — N2889 Other specified disorders of kidney and ureter: Secondary | ICD-10-CM | POA: Diagnosis present

## 2015-01-30 DIAGNOSIS — I10 Essential (primary) hypertension: Secondary | ICD-10-CM

## 2015-01-30 DIAGNOSIS — D631 Anemia in chronic kidney disease: Secondary | ICD-10-CM | POA: Diagnosis present

## 2015-01-30 DIAGNOSIS — N189 Chronic kidney disease, unspecified: Secondary | ICD-10-CM

## 2015-01-30 DIAGNOSIS — K648 Other hemorrhoids: Secondary | ICD-10-CM | POA: Diagnosis present

## 2015-01-30 DIAGNOSIS — IMO0002 Reserved for concepts with insufficient information to code with codable children: Secondary | ICD-10-CM | POA: Insufficient documentation

## 2015-01-30 DIAGNOSIS — Z87891 Personal history of nicotine dependence: Secondary | ICD-10-CM | POA: Diagnosis not present

## 2015-01-30 DIAGNOSIS — K922 Gastrointestinal hemorrhage, unspecified: Secondary | ICD-10-CM | POA: Diagnosis present

## 2015-01-30 DIAGNOSIS — I120 Hypertensive chronic kidney disease with stage 5 chronic kidney disease or end stage renal disease: Secondary | ICD-10-CM | POA: Diagnosis present

## 2015-01-30 DIAGNOSIS — D649 Anemia, unspecified: Secondary | ICD-10-CM

## 2015-01-30 DIAGNOSIS — N185 Chronic kidney disease, stage 5: Secondary | ICD-10-CM

## 2015-01-30 DIAGNOSIS — R229 Localized swelling, mass and lump, unspecified: Secondary | ICD-10-CM

## 2015-01-30 DIAGNOSIS — G4730 Sleep apnea, unspecified: Secondary | ICD-10-CM | POA: Diagnosis present

## 2015-01-30 DIAGNOSIS — K625 Hemorrhage of anus and rectum: Secondary | ICD-10-CM

## 2015-01-30 HISTORY — DX: Anemia in other chronic diseases classified elsewhere: D63.8

## 2015-01-30 HISTORY — DX: Anemia, unspecified: D64.9

## 2015-01-30 LAB — CBC
HCT: 18.2 % — ABNORMAL LOW (ref 39.0–52.0)
HCT: 20.3 % — ABNORMAL LOW (ref 39.0–52.0)
HEMOGLOBIN: 5.2 g/dL — AB (ref 13.0–17.0)
HEMOGLOBIN: 6.3 g/dL — AB (ref 13.0–17.0)
MCH: 22.5 pg — ABNORMAL LOW (ref 26.0–34.0)
MCH: 24.1 pg — ABNORMAL LOW (ref 26.0–34.0)
MCHC: 28.6 g/dL — ABNORMAL LOW (ref 30.0–36.0)
MCHC: 31 g/dL (ref 30.0–36.0)
MCV: 78.8 fL (ref 78.0–100.0)
MCV: 77.8 fL — ABNORMAL LOW (ref 78.0–100.0)
Platelets: 357 10*3/uL (ref 150–400)
Platelets: 283 10*3/uL (ref 150–400)
RBC: 2.31 MIL/uL — ABNORMAL LOW (ref 4.22–5.81)
RBC: 2.61 MIL/uL — AB (ref 4.22–5.81)
RDW: 17.3 % — ABNORMAL HIGH (ref 11.5–15.5)
RDW: 16.6 % — ABNORMAL HIGH (ref 11.5–15.5)
WBC: 10.6 10*3/uL — ABNORMAL HIGH (ref 4.0–10.5)
WBC: 9.1 10*3/uL (ref 4.0–10.5)

## 2015-01-30 LAB — ABO/RH: ABO/RH(D): O POS

## 2015-01-30 LAB — PROTIME-INR
INR: 1.13 (ref 0.00–1.49)
PROTHROMBIN TIME: 14.7 s (ref 11.6–15.2)

## 2015-01-30 LAB — I-STAT TROPONIN, ED: TROPONIN I, POC: 0.06 ng/mL (ref 0.00–0.08)

## 2015-01-30 LAB — TSH: TSH: 0.882 u[IU]/mL (ref 0.350–4.500)

## 2015-01-30 LAB — BASIC METABOLIC PANEL
Anion gap: 6 (ref 5–15)
BUN: 49 mg/dL — AB (ref 6–23)
CHLORIDE: 114 mmol/L — AB (ref 96–112)
CO2: 16 mmol/L — ABNORMAL LOW (ref 19–32)
Calcium: 8.4 mg/dL (ref 8.4–10.5)
Creatinine, Ser: 8.98 mg/dL — ABNORMAL HIGH (ref 0.50–1.35)
GFR calc Af Amer: 7 mL/min — ABNORMAL LOW (ref >90)
GFR calc non Af Amer: 6 mL/min — ABNORMAL LOW (ref >90)
GLUCOSE: 93 mg/dL (ref 70–99)
Potassium: 4.3 mmol/L (ref 3.5–5.1)
Sodium: 136 mmol/L (ref 135–145)

## 2015-01-30 LAB — PREPARE RBC (CROSSMATCH)

## 2015-01-30 LAB — POC OCCULT BLOOD, ED: FECAL OCCULT BLD: NEGATIVE

## 2015-01-30 MED ORDER — SODIUM CHLORIDE 0.9 % IJ SOLN
3.0000 mL | Freq: Two times a day (BID) | INTRAMUSCULAR | Status: DC
  Administered 2015-01-30 – 2015-02-01 (×4): 3 mL via INTRAVENOUS

## 2015-01-30 MED ORDER — SODIUM CHLORIDE 0.9 % IV SOLN
10.0000 mL/h | Freq: Once | INTRAVENOUS | Status: AC
  Administered 2015-01-30: 10 mL/h via INTRAVENOUS

## 2015-01-30 MED ORDER — HYDROCORTISONE ACETATE 25 MG RE SUPP
25.0000 mg | Freq: Two times a day (BID) | RECTAL | Status: DC
  Administered 2015-01-31 – 2015-02-01 (×2): 25 mg via RECTAL
  Filled 2015-01-30 (×8): qty 1

## 2015-01-30 MED ORDER — HEPARIN SODIUM (PORCINE) 5000 UNIT/ML IJ SOLN
5000.0000 [IU] | Freq: Three times a day (TID) | INTRAMUSCULAR | Status: DC
  Administered 2015-01-30 – 2015-01-31 (×4): 5000 [IU] via SUBCUTANEOUS
  Filled 2015-01-30 (×4): qty 1

## 2015-01-30 MED ORDER — AMLODIPINE BESYLATE 10 MG PO TABS
10.0000 mg | ORAL_TABLET | Freq: Every day | ORAL | Status: DC
  Administered 2015-01-30 – 2015-02-02 (×4): 10 mg via ORAL
  Filled 2015-01-30 (×4): qty 1

## 2015-01-30 MED ORDER — MORPHINE SULFATE 2 MG/ML IJ SOLN: 1.0000 mg | INTRAMUSCULAR | Status: DC | PRN

## 2015-01-30 MED ORDER — SODIUM CHLORIDE 0.9 % IV SOLN
INTRAVENOUS | Status: DC
  Administered 2015-01-30 – 2015-02-01 (×2): via INTRAVENOUS

## 2015-01-30 MED ORDER — PANTOPRAZOLE SODIUM 40 MG PO TBEC
40.0000 mg | DELAYED_RELEASE_TABLET | Freq: Every day | ORAL | Status: DC
  Administered 2015-01-30 – 2015-02-02 (×4): 40 mg via ORAL
  Filled 2015-01-30 (×3): qty 1

## 2015-01-30 MED ORDER — ACETAMINOPHEN 325 MG PO TABS: 650.0000 mg | ORAL_TABLET | Freq: Four times a day (QID) | ORAL | Status: DC | PRN

## 2015-01-30 MED ORDER — ACETAMINOPHEN 650 MG RE SUPP: 650.0000 mg | Freq: Four times a day (QID) | RECTAL | Status: DC | PRN

## 2015-01-30 MED ORDER — HYDRALAZINE HCL 10 MG PO TABS
10.0000 mg | ORAL_TABLET | Freq: Four times a day (QID) | ORAL | Status: DC
  Administered 2015-01-30 – 2015-02-01 (×10): 10 mg via ORAL
  Filled 2015-01-30 (×12): qty 1

## 2015-01-30 MED ORDER — HYDROCODONE-ACETAMINOPHEN 5-325 MG PO TABS: 1.0000 | ORAL_TABLET | ORAL | Status: DC | PRN

## 2015-01-30 NOTE — Consult Note (Signed)
[facility] Consultation Note  Referring Provider:  Verlee Monte, MD [facility]) Primary Care Physician:  London Pepper, MD Primary Gastroenterologist:  Dr. Laurence Spates  Reason for Consultation:  Anemia, hematochezia  HPI: Travis [name] is a 54 y.o. male whom we've been asked to see for above complaints.  For the past 6+ months, patient has had intermittent bouts of hematochezia.  He has no abdominal pain, change in bowel habits, nausea, vomiting, hematemesis, dysphagia.  No constipation or fecal straining.  No weight loss.  Had colonoscopy in October 2014 by Dr. Oletta Lamas for evaluation of hematochezia, which showed moderate internal hemorrhoids and small polyp.  No NSAIDs.  Has had escalation of renal disease, with evaluation into possible hemodialysis underway.   Past Medical History  Diagnosis Date  . Hypertension   . Chronic kidney disease   . Pneumonia     as a child  . GERD (gastroesophageal reflux disease)     takes Nexium  . Sleep apnea     uses c-pap 2 yrs  . Anemia 01/30/2015    Past Surgical History  Procedure Laterality Date  . Mouth surgery      teeth cleaning  . Colonoscopy    . Bascilic vein transposition Left 01/05/2014    Procedure: LEFT 1ST STAGE BASCILIC VEIN TRANSPOSITION;  Surgeon: Conrad Kaaawa, MD;  Location: [place];  Service: Vascular;  Laterality: Left;  . Bascilic vein transposition Left 07/26/2014    Procedure: Left Arm Brachial Vein Transposition Second Stage;  Surgeon: Conrad [place], MD;  Location: [place];  Service: Vascular;  Laterality: Left;    Prior to Admission medications   Medication Sig Start Date End Date Taking? Authorizing Provider  amLODipine (NORVASC) 10 MG tablet Take 10 mg by mouth daily.   Yes Historical Provider, MD  calcitRIOL (ROCALTROL) 0.25 MCG capsule Take 0.25 mcg by mouth every Monday, Wednesday, and Friday.    Yes Historical Provider, MD  esomeprazole (NEXIUM) 40 MG capsule Take 40 mg by mouth daily as needed (for acid  reflux).    Yes Historical Provider, MD  Fexofenadine HCl (MUCINEX ALLERGY PO) Take 2 tablets by mouth 4 (four) times daily as needed (congestion).   Yes Historical Provider, MD  hydrALAZINE (APRESOLINE) 10 MG tablet Take 10 mg by mouth 4 (four) times daily.   Yes Historical Provider, MD  multivitamin (RENA-VIT) TABS tablet Take 1 tablet by mouth daily.   Yes Historical Provider, MD  B Complex-C-Folic Acid (VOL-CARE RX) 1 MG TABS Take 1 tablet by mouth daily.  08/02/14   Historical Provider, MD  oxyCODONE (ROXICODONE) 5 MG immediate release tablet Take 1 tablet (5 mg total) by mouth every 4 (four) hours as needed for severe pain. Patient not taking: Reported on 01/30/2015 07/26/14   Conrad Hartford, MD  VIAGRA 50 MG tablet  08/20/14   Historical Provider, MD    Current Facility-Administered Medications  Medication Dose Route Frequency Provider Last Rate Last Dose  . 0.9 %  sodium chloride infusion   Intravenous Continuous Verlee Monte, MD      . acetaminophen (TYLENOL) tablet 650 mg  650 mg Oral Q6H PRN Verlee Monte, MD       Or  . acetaminophen (TYLENOL) suppository 650 mg  650 mg Rectal Q6H PRN Verlee Monte, MD      . amLODipine (NORVASC) tablet 10 mg  10 mg Oral Daily Verlee Monte, MD      . heparin injection 5,000 Units  5,000 Units Subcutaneous 3  times per day Verlee Monte, MD      . hydrALAZINE (APRESOLINE) tablet 10 mg  10 mg Oral QID Verlee Monte, MD      . HYDROcodone-acetaminophen (NORCO/VICODIN) 5-325 MG per tablet 1-2 tablet  1-2 tablet Oral Q4H PRN Verlee Monte, MD      . morphine 2 MG/ML injection 1 mg  1 mg Intravenous Q4H PRN Verlee Monte, MD      . pantoprazole (PROTONIX) EC tablet 40 mg  40 mg Oral Daily Mutaz Elmahi, MD      . sodium chloride 0.9 % injection 3 mL  3 mL Intravenous Q12H Verlee Monte, MD        Allergies as of 01/30/2015 - Review Complete 01/30/2015  Allergen Reaction Noted  . Ibuprofen Hives 11/13/2012  . Lisinopril Swelling 05/17/2014  . Naproxen Hives and  Other (See Comments) 07/26/2014    Family History  Problem Relation Age of Onset  . Hypertension Mother   . Hypertension Father   . Hypertension Sister     History   Social History  . Marital Status: Married    Spouse Name: N/A  . Number of Children: N/A  . Years of Education: N/A   Occupational History  . Not on file.   Social History Main Topics  . Smoking status: Former Smoker -- 4 years    Types: Cigars    Quit date: 12/31/2012  . Smokeless tobacco: Never Used  . Alcohol Use: No  . Drug Use: No  . Sexual Activity: Not on file   Other Topics Concern  . Not on file   Social History Narrative    Review of Systems: ROS Dr. Hartford Poli reviewed 01/30/15 and I agree  Physical Exam: Vital signs in last 24 hours: Temp:  [98.2 F (36.8 C)-98.7 F (37.1 C)] 98.3 F (36.8 C) (02/22 0900) Pulse Rate:  [82-103] 86 (02/22 0900) Resp:  [16-18] 18 (02/22 0900) BP: (160-180)/(83-109) 169/102 mmHg (02/22 0900) SpO2:  [89 %-100 %] 99 % (02/22 0900) Weight:  [113.399 kg (250 lb)-113.5 kg (250 lb 3.6 oz)] 113.5 kg (250 lb 3.6 oz) (02/22 0900)   General:   Alert,  Overweight, Well-developed, well-nourished, pleasant and cooperative in NAD Head:  Normocephalic and atraumatic. Eyes:  Sclera clear, no icterus.   Conjunctiva pink. Ears:  Normal auditory acuity. Nose:  No deformity, discharge,  or lesions. Mouth:  No deformity or lesions.  Oropharynx pink & moist. Neck:  Supple; no masses or thyromegaly. Lungs:  Clear throughout to auscultation.   No wheezes, crackles, or rhonchi. No acute distress. Heart:  Regular rate and rhythm; no murmurs, clicks, rubs,  or gallops. Abdomen:  Soft, nontender and nondistended. No masses, hepatosplenomegaly or hernias noted. Normal bowel sounds, without guarding, and without rebound.     Msk:  Symmetrical without gross deformities. Normal posture. Pulses:  Normal pulses noted. Extremities:  Without clubbing or edema. Neurologic:  Alert and   oriented x4;  grossly normal neurologically. Skin:  Intact without significant lesions or rashes. Psych:  Alert and cooperative. Normal mood and affect.  Lab Results:  Recent Labs  01/30/15 0452  WBC 10.6*  HGB 5.2*  HCT 18.2*  PLT 357   BMET  Recent Labs  01/30/15 0452  NA 136  K 4.3  CL 114*  CO2 16*  GLUCOSE 93  BUN 49*  CREATININE 8.98*  CALCIUM 8.4   LFT No results for input(s): PROT, ALBUMIN, AST, ALT, ALKPHOS, BILITOT, BILIDIR, IBILI in the last 72 hours.  PT/INR No results for input(s): LABPROT, INR in the last 72 hours.  Studies/Results: Dg Chest 2 View (if Patient Has Fever And/or Copd)  01/30/2015   CLINICAL DATA:  Shortness of breath. Cough and congestion. Weakness. Symptoms for 4-5 days.  EXAM: CHEST  2 VIEW  COMPARISON:  01/03/2014  FINDINGS: The heart is at the upper limits of normal in size. Pulmonary vasculature is normal. There is no consolidation, pleural effusion, or pneumothorax. No acute osseous abnormalities are seen.  IMPRESSION: Heart at the upper limits of normal in size. No acute pulmonary process.   Electronically Signed   By: Jeb Levering M.D.   On: 01/30/2015 05:40   Impression:  1.  Progressive anemia.  Doubt this is due to the chronic low-grade hematochezia he has apparently been having, on-and-off, for several months.  But hard to attribute all of this to his escalating renal insufficiency, either. 2.  Hematochezia, intermittent for many months.  Colonoscopy also done in October 2014 for evaluation of hematochezia.  Suspect this is hemorrhoid-related.  Plan:  1.  Supportive care including gentle hydration and transfusion. 2.  Endoscopy tomorrow for further investigation into patient's anemia. 3.  Topical therapies (Anusol-HC) for presumed hemorrhoidal bleeding. 4.  If bleeding persists despite conservative therapies, might consider sigmoidoscopy/colonoscopy.  Don't see need for expedited colonoscopy at this point in time, given recent  colonoscopy about 1.5 years ago also for investigation into anemia. 5.  Clear liquid diet, NPO after midnight. 6.  Will follow.     Landry Dyke  01/30/2015, 10:22 AM

## 2015-01-30 NOTE — ED Provider Notes (Signed)
CSN: [id number]     Arrival date & time 01/30/15  0431 History   First MD Initiated Contact with Patient 01/30/15 [phone]     Chief Complaint  Patient presents with  . Shortness of Breath    Patient is a 54 y.o. male presenting with chest pain. The history is provided by the patient and a significant other.  Chest Pain Pain location:  Substernal area Pain quality: dull   Pain radiates to:  Does not radiate Pain severity:  Moderate Onset quality:  Gradual Timing:  Intermittent Progression:  Resolved Chronicity:  New Relieved by:  Rest Worsened by:  Exertion Associated symptoms: cough, fatigue and shortness of breath   Associated symptoms: no abdominal pain, no fever and not vomiting   Patient with h/o HTN and chronic kidney disease though he is not on dialysis as of yet Patients reports that yesterday he felt fatigued with exertion He also reports having chest dullness while doing yardwork He reports he is having dyspnea on exertion He reports recent cough and chest congestion for up to 2 weeks No fever No syncope No LE edema He denies h/o CAD/PE/CVA  Past Medical History  Diagnosis Date  . Hypertension   . Chronic kidney disease   . Pneumonia     as a child  . GERD (gastroesophageal reflux disease)     takes Nexium  . Sleep apnea     uses c-pap 2 yrs   Past Surgical History  Procedure Laterality Date  . Mouth surgery      teeth cleaning  . Colonoscopy    . Bascilic vein transposition Left 01/05/2014    Procedure: LEFT 1ST STAGE BASCILIC VEIN TRANSPOSITION;  Surgeon: Conrad [place], MD;  Location: [place];  Service: Vascular;  Laterality: Left;  . Bascilic vein transposition Left 07/26/2014    Procedure: Left Arm Brachial Vein Transposition Second Stage;  Surgeon: Conrad Caney, MD;  Location: [place];  Service: Vascular;  Laterality: Left;   Family History  Problem Relation Age of Onset  . Hypertension Mother   . Hypertension Father   . Hypertension Sister    History   Substance Use Topics  . Smoking status: Former Smoker -- 4 years    Types: Cigars    Quit date: 12/31/2012  . Smokeless tobacco: Never Used  . Alcohol Use: No    Review of Systems  Constitutional: Positive for fatigue. Negative for fever.  Respiratory: Positive for cough and shortness of breath.   Cardiovascular: Positive for chest pain. Negative for leg swelling.  Gastrointestinal: Negative for vomiting and abdominal pain.  Neurological: Negative for syncope.  All other systems reviewed and are negative.     Allergies  Ibuprofen; Lisinopril; and Naproxen  Home Medications   Prior to Admission medications   Medication Sig Start Date End Date Taking? Authorizing Provider  amLODipine (NORVASC) 10 MG tablet Take 10 mg by mouth daily.    Historical Provider, MD  B Complex-C-Folic Acid (VOL-CARE RX) 1 MG TABS  08/02/14   Historical Provider, MD  calcitRIOL (ROCALTROL) 0.25 MCG capsule Take 0.25 mcg by mouth every Monday, Wednesday, and Friday.     Historical Provider, MD  esomeprazole (NEXIUM) 40 MG capsule Take 40 mg by mouth daily as needed (for acid reflux).     Historical Provider, MD  hydrALAZINE (APRESOLINE) 10 MG tablet Take 10 mg by mouth 4 (four) times daily.    Historical Provider, MD  multivitamin (RENA-VIT) TABS tablet Take 1 tablet by mouth  daily.    Historical Provider, MD  oxyCODONE (ROXICODONE) 5 MG immediate release tablet Take 1 tablet (5 mg total) by mouth every 4 (four) hours as needed for severe pain. 07/26/14   Conrad Barranquitas, MD  VIAGRA 50 MG tablet  08/20/14   Historical Provider, MD   BP 160/94 mmHg  Pulse 92  Temp(Src) 98.2 F (36.8 C) (Oral)  Resp 16  Ht 5\' 11"  (1.803 m)  Wt 250 lb (113.399 kg)  BMI 34.88 kg/m2  SpO2 100% Physical Exam CONSTITUTIONAL: Well developed/well nourished HEAD: Normocephalic/atraumatic EYES: EOMI/PERRL, conjunctiva pale ENMT: Mucous membranes moist NECK: supple no meningeal signs SPINE/BACK:entire spine nontender CV:  S1/S2 noted LUNGS: Lungs are clear to auscultation bilaterally, no apparent distress ABDOMEN: soft, nontender, no rebound or guarding, bowel sounds noted throughout abdomen GU:no cva tenderness NEURO: Pt is awake/alert/appropriate, moves all extremitiesx4.  No facial droop.   EXTREMITIES: pulses normal/equal, full ROM, thrill noted to dialysis access to left UE SKIN: warm, color normal PSYCH: no abnormalities of mood noted, alert and oriented to situation  ED Course  Procedures  CRITICAL CARE Performed by: Sharyon Cable Total critical care time: 31 Critical care time was exclusive of separately billable procedures and treating other patients. Critical care was necessary to treat or prevent imminent or life-threatening deterioration. Critical care was time spent personally by me on the following activities: development of treatment plan with patient and/or surrogate as well as nursing, discussions with consultants, evaluation of patient's response to treatment, examination of patient, obtaining history from patient or surrogate, ordering and performing treatments and interventions, ordering and review of laboratory studies, ordering and review of radiographic studies, pulse oximetry and re-evaluation of patient's condition. PATIENT WITH SIGNIFICANT/SYMPTOMATIC ANEMIA (hgb 5) WITH RECENT CHEST PAIN AND DYSPNEA ON EXERTION BLOOD PRODUCTS HAVE BEEN ORDERED  5:31 AM After initial discussion pt now reports recent rectal bleeding He is noted to be anemic He has no objections to blood transfusion Stool color brown, no blood or melena - chaperone present 6:20 AM WILL ADMIT PATIENT FOR TRANSFUSION/MONITORING FOR SYMPTOMATIC ANEMIA 6:55 AM D/w dr Arnoldo Morale with [facility] Will admit to TELE obs 2 units PRBC have been ordered and patient has given consent for blood Pt is stable, he has had episode of hypertension in the ED No active bleeding at this time  Labs Review Labs Reviewed  BASIC  METABOLIC PANEL - Abnormal; Notable for the following:    Chloride 114 (*)    CO2 16 (*)    BUN 49 (*)    Creatinine, Ser 8.98 (*)    GFR calc non Af Amer 6 (*)    GFR calc Af Amer 7 (*)    All other components within normal limits  CBC - Abnormal; Notable for the following:    WBC 10.6 (*)    RBC 2.31 (*)    Hemoglobin 5.2 (*)    HCT 18.2 (*)    MCH 22.5 (*)    MCHC 28.6 (*)    RDW 17.3 (*)    All other components within normal limits  I-STAT TROPOININ, ED  POC OCCULT BLOOD, ED  TYPE AND SCREEN  PREPARE RBC (CROSSMATCH)    Imaging Review Dg Chest 2 View (if Patient Has Fever And/or Copd)  01/30/2015   CLINICAL DATA:  Shortness of breath. Cough and congestion. Weakness. Symptoms for 4-5 days.  EXAM: CHEST  2 VIEW  COMPARISON:  01/03/2014  FINDINGS: The heart is at the upper limits of normal in size. Pulmonary  vasculature is normal. There is no consolidation, pleural effusion, or pneumothorax. No acute osseous abnormalities are seen.  IMPRESSION: Heart at the upper limits of normal in size. No acute pulmonary process.   Electronically Signed   By: Jeb Levering M.D.   On: 01/30/2015 05:40     EKG Interpretation   Date/Time:  Monday January 30 2015 04:37:56 EST Ventricular Rate:  97 PR Interval:  182 QRS Duration: 91 QT Interval:  388 QTC Calculation: 493 R Axis:   64 Text Interpretation:  Sinus rhythm Borderline prolonged QT interval  Otherwise no significant change Confirmed by Christy Gentles  MD, Elenore Rota ([id number])  on 01/30/2015 4:43:16 AM      MDM   Final diagnoses:  Symptomatic anemia  Anemia, unspecified anemia type  Renal failure (ARF), acute on chronic    Nursing notes including past medical history and social history reviewed and considered in documentation Labs/vital reviewed myself and considered during evaluation xrays/imaging reviewed by myself and considered during evaluation Previous records reviewed and considered    Sharyon Cable, MD 01/30/15  [phone]

## 2015-01-30 NOTE — ED Notes (Signed)
MD at bedside. 

## 2015-01-30 NOTE — H&P (Signed)
[facility] History and Physical  IZEK TALSMA [id number] DOB: 02-Oct-1961 DOA: 01/30/2015  Referring physician: EDP PCP: London Pepper, MD   Chief Complaint: Shortness of breath and fatigue  HPI: Travis [name] is a 54 y.o. male with CKD stage V, reported creatinine to be around 7, HTN and GERD. Patient given to the hospital complaining about SOB and fatigue. Patient said for the past 5-6 month he had right foot blood per rectum comes every now and then when he had bowel movement, denies any history of hemorrhoids. He reported colonoscopy recently which she was being told it was okay. He denies any hematemesis, denies any melanotic stools. For the past several weeks he started to get short of breath, early fatigability and sometimes chest pain on exertion. Patient given to the hospital for further evaluation. In the ED was found to have hemoglobin of 5.2, last hemoglobin was 9.2 in August 2015 and creatinine of 8.9.  Review of Systems:  Constitutional: negative for anorexia, fevers and sweats Eyes: negative for irritation, redness and visual disturbance Ears, nose, mouth, throat, and face: negative for earaches, epistaxis, nasal congestion and sore throat Respiratory: negative for cough, dyspnea on exertion, sputum and wheezing Cardiovascular: negative for chest pain, dyspnea, lower extremity edema, orthopnea, palpitations and syncope Gastrointestinal: negative for abdominal pain, constipation, diarrhea, melena, nausea and vomiting Genitourinary:negative for dysuria, frequency and hematuria Hematologic/lymphatic: negative for bleeding, easy bruising and lymphadenopathy Musculoskeletal:negative for arthralgias, muscle weakness and stiff joints Neurological: negative for coordination problems, gait problems, headaches and weakness Endocrine: negative for diabetic symptoms including polydipsia, polyuria and weight loss Allergic/Immunologic: negative for anaphylaxis, hay fever  and urticaria  Past Medical History  Diagnosis Date  . Hypertension   . Chronic kidney disease   . Pneumonia     as a child  . GERD (gastroesophageal reflux disease)     takes Nexium  . Sleep apnea     uses c-pap 2 yrs   Past Surgical History  Procedure Laterality Date  . Mouth surgery      teeth cleaning  . Colonoscopy    . Bascilic vein transposition Left 01/05/2014    Procedure: LEFT 1ST STAGE BASCILIC VEIN TRANSPOSITION;  Surgeon: Conrad [place], MD;  Location: [place];  Service: Vascular;  Laterality: Left;  . Bascilic vein transposition Left 07/26/2014    Procedure: Left Arm Brachial Vein Transposition Second Stage;  Surgeon: Conrad [place], MD;  Location: [place];  Service: Vascular;  Laterality: Left;   Social History:   reports that he quit smoking about 2 years ago. His smoking use included Cigars. He has never used smokeless tobacco. He reports that he does not drink alcohol or use illicit drugs.  Allergies  Allergen Reactions  . Ibuprofen Hives  . Lisinopril Swelling    PT states he is allergic to all prils; caused facial swelling  . Naproxen Hives and Other (See Comments)    Alleve causes patient to have hives    Family History  Problem Relation Age of Onset  . Hypertension Mother   . Hypertension Father   . Hypertension Sister     Prior to Admission medications   Medication Sig Start Date End Date Taking? Authorizing Provider  amLODipine (NORVASC) 10 MG tablet Take 10 mg by mouth daily.   Yes Historical Provider, MD  calcitRIOL (ROCALTROL) 0.25 MCG capsule Take 0.25 mcg by mouth every Monday, Wednesday, and Friday.    Yes Historical Provider, MD  esomeprazole (NEXIUM) 40 MG capsule Take  40 mg by mouth daily as needed (for acid reflux).    Yes Historical Provider, MD  Fexofenadine HCl (MUCINEX ALLERGY PO) Take 2 tablets by mouth 4 (four) times daily as needed (congestion).   Yes Historical Provider, MD  hydrALAZINE (APRESOLINE) 10 MG tablet Take 10 mg by mouth 4  (four) times daily.   Yes Historical Provider, MD  multivitamin (RENA-VIT) TABS tablet Take 1 tablet by mouth daily.   Yes Historical Provider, MD  B Complex-C-Folic Acid (VOL-CARE RX) 1 MG TABS Take 1 tablet by mouth daily.  08/02/14   Historical Provider, MD  oxyCODONE (ROXICODONE) 5 MG immediate release tablet Take 1 tablet (5 mg total) by mouth every 4 (four) hours as needed for severe pain. Patient not taking: Reported on 01/30/2015 07/26/14   Conrad Gerty, MD  VIAGRA 50 MG tablet  08/20/14   Historical Provider, MD   Physical Exam: Filed Vitals:   01/30/15 0737  BP: 172/96  Pulse: 98  Temp:   Resp: 18   Constitutional: Oriented to person, place, and time. Well-developed and well-nourished. Cooperative.  Head: Normocephalic and atraumatic.  Nose: Nose normal.  Mouth/Throat: Uvula is midline, oropharynx is clear and moist and mucous membranes are normal.  Eyes: Conjunctivae and EOM are normal. Pupils are equal, round, and reactive to light.  Neck: Trachea normal and normal range of motion. Neck supple.  Cardiovascular: Normal rate, regular rhythm, S1 normal, S2 normal, normal heart sounds and intact distal pulses.   Pulmonary/Chest: Effort normal and breath sounds normal.  Abdominal: Soft. Bowel sounds are normal. There is no hepatosplenomegaly. There is no tenderness.  Musculoskeletal: Normal range of motion.  Neurological: Alert and oriented to person, place, and time. Has normal strength. No cranial nerve deficit or sensory deficit.  Skin: Skin is warm, dry and intact.  Psychiatric: Has a normal mood and affect. Speech is normal and behavior is normal.   Labs on Admission:  Basic Metabolic Panel:  Recent Labs Lab 01/30/15 0452  NA 136  K 4.3  CL 114*  CO2 16*  GLUCOSE 93  BUN 49*  CREATININE 8.98*  CALCIUM 8.4   Liver Function Tests: No results for input(s): AST, ALT, ALKPHOS, BILITOT, PROT, ALBUMIN in the last 168 hours. No results for input(s): LIPASE, AMYLASE in  the last 168 hours. No results for input(s): AMMONIA in the last 168 hours. CBC:  Recent Labs Lab 01/30/15 0452  WBC 10.6*  HGB 5.2*  HCT 18.2*  MCV 78.8  PLT 357   Cardiac Enzymes: No results for input(s): CKTOTAL, CKMB, CKMBINDEX, TROPONINI in the last 168 hours.  BNP (last 3 results) No results for input(s): BNP in the last 8760 hours.  ProBNP (last 3 results) No results for input(s): PROBNP in the last 8760 hours.  CBG: No results for input(s): GLUCAP in the last 168 hours.  Radiological Exams on Admission: Dg Chest 2 View (if Patient Has Fever And/or Copd)  01/30/2015   CLINICAL DATA:  Shortness of breath. Cough and congestion. Weakness. Symptoms for 4-5 days.  EXAM: CHEST  2 VIEW  COMPARISON:  01/03/2014  FINDINGS: The heart is at the upper limits of normal in size. Pulmonary vasculature is normal. There is no consolidation, pleural effusion, or pneumothorax. No acute osseous abnormalities are seen.  IMPRESSION: Heart at the upper limits of normal in size. No acute pulmonary process.   Electronically Signed   By: Jeb Levering M.D.   On: 01/30/2015 05:40    EKG: Independently reviewed.  NSR, heart rate is 97  Assessment/Plan Principal Problem:   Symptomatic anemia Active Problems:   CKD (chronic kidney disease), stage V   Anemia due to chronic blood loss   BRBPR (bright red blood per rectum)   Essential hypertension    Symptomatic anemia Patient has baseline anemia secondary to his CKD with hemoglobin baseline around 9.2 from August 2015. Patient presented with hemoglobin of 5.2. Patient to be chronic blood loss in the past 5-6 month, BRBPR patient almost describing hemorrhoidal bleed. We'll transfuse 2 units of packed RBCs, check hemoglobin, keep hemoglobin >7.5. I'll check ferritin and iron, probably will need iron supplementation.  GI bleed BRBPR for the past 5-6 months, reported colonoscopy about 5 months ago. Blood around the stool and when he wipes  sometimes, no melanotic stools. GI consulted for further evaluation.  CKD stage V Baseline creatinine is 7 according to him, presents with creatinine of 8.9. Hydrate with IV fluids and check creatinine in the a.m. already had AV fistula created. I will call [name] for a courtesy notification.  Essential hypertension Hemodynamically stable, restart home medications.  Code Status: Full code Family Communication: Plan discussed with the patient in the presence of her husband decide Disposition Plan: None  Time spent: 70 minutes  [facility] Pager [phone]

## 2015-01-30 NOTE — ED Notes (Signed)
Pt arrives from home with c/o cold symptoms ongoing for the past week or so, states it's been getting progressively worse. Taking mucinex, not effective. States he was working in the yard yesterday and noticed he was getting tired sooner than usual. States when he woke up this morning, he felt like he couldn't catch his breath. States mildly better now. Speaking in full sentences in triage. No diaphoresis. States chest pain and diarrhea yesterday, none today.

## 2015-01-30 NOTE — ED Notes (Signed)
Patient transported to X-ray 

## 2015-01-30 NOTE — Progress Notes (Signed)
Lab tech unable to get viable sample after 2 attempts.  Await 2nd tech.  Will report to night RN to follow up.

## 2015-01-30 NOTE — ED Notes (Signed)
Blood Consent signed and placed at bedside

## 2015-01-31 ENCOUNTER — Encounter (HOSPITAL_COMMUNITY)
Admission: EM | Disposition: A | Discharge status: Home / Self Care | Payer: Self-pay | Source: Home / Self Care | Attending: Internal Medicine

## 2015-01-31 ENCOUNTER — Encounter (HOSPITAL_COMMUNITY): Payer: Self-pay | Admitting: *Deleted

## 2015-01-31 HISTORY — PX: ESOPHAGOGASTRODUODENOSCOPY: SHX5428

## 2015-01-31 HISTORY — PX: GIVENS CAPSULE STUDY: SHX5432

## 2015-01-31 LAB — BASIC METABOLIC PANEL
Anion gap: 6 (ref 5–15)
BUN: 41 mg/dL — ABNORMAL HIGH (ref 6–23)
CO2: 18 mmol/L — AB (ref 19–32)
Calcium: 8.8 mg/dL (ref 8.4–10.5)
Chloride: 115 mmol/L — ABNORMAL HIGH (ref 96–112)
Creatinine, Ser: 8.25 mg/dL — ABNORMAL HIGH (ref 0.50–1.35)
GFR calc Af Amer: 8 mL/min — ABNORMAL LOW (ref >90)
GFR calc non Af Amer: 7 mL/min — ABNORMAL LOW (ref >90)
GLUCOSE: 88 mg/dL (ref 70–99)
Potassium: 4.5 mmol/L (ref 3.5–5.1)
SODIUM: 139 mmol/L (ref 135–145)

## 2015-01-31 LAB — IRON AND TIBC
Iron: 18 ug/dL — ABNORMAL LOW (ref 42–165)
SATURATION RATIOS: 5 % — AB (ref 20–55)
TIBC: 350 ug/dL (ref 215–435)
UIBC: 332 ug/dL (ref 125–400)

## 2015-01-31 LAB — CBC
HEMATOCRIT: 21.5 % — AB (ref 39.0–52.0)
HEMATOCRIT: 28.2 % — AB (ref 39.0–52.0)
HEMOGLOBIN: 6.6 g/dL — AB (ref 13.0–17.0)
Hemoglobin: 8.9 g/dL — ABNORMAL LOW (ref 13.0–17.0)
MCH: 24 pg — ABNORMAL LOW (ref 26.0–34.0)
MCH: 25.3 pg — ABNORMAL LOW (ref 26.0–34.0)
MCHC: 30.7 g/dL (ref 30.0–36.0)
MCHC: 31.6 g/dL (ref 30.0–36.0)
MCV: 78.2 fL (ref 78.0–100.0)
MCV: 80.1 fL (ref 78.0–100.0)
Platelets: 330 10*3/uL (ref 150–400)
Platelets: 351 10*3/uL (ref 150–400)
RBC: 2.75 MIL/uL — AB (ref 4.22–5.81)
RBC: 3.52 MIL/uL — AB (ref 4.22–5.81)
RDW: 16.5 % — ABNORMAL HIGH (ref 11.5–15.5)
RDW: 16.5 % — ABNORMAL HIGH (ref 11.5–15.5)
WBC: 8.6 10*3/uL (ref 4.0–10.5)
WBC: 9.5 10*3/uL (ref 4.0–10.5)

## 2015-01-31 LAB — PREPARE RBC (CROSSMATCH)

## 2015-01-31 LAB — FERRITIN: Ferritin: 7 ng/mL — ABNORMAL LOW (ref 22–322)

## 2015-01-31 SURGERY — IMAGING PROCEDURE, GI TRACT, INTRALUMINAL, VIA CAPSULE
Anesthesia: LOCAL

## 2015-01-31 SURGERY — EGD (ESOPHAGOGASTRODUODENOSCOPY)
Anesthesia: Moderate Sedation | Laterality: Left

## 2015-01-31 MED ORDER — DIPHENHYDRAMINE HCL 50 MG/ML IJ SOLN
INTRAMUSCULAR | Status: AC
  Filled 2015-01-31: qty 1

## 2015-01-31 MED ORDER — SODIUM CHLORIDE 0.9 % IV SOLN
Freq: Once | INTRAVENOUS | Status: AC
  Administered 2015-01-31: 08:00:00 via INTRAVENOUS

## 2015-01-31 MED ORDER — MIDAZOLAM HCL 5 MG/ML IJ SOLN
INTRAMUSCULAR | Status: AC
  Filled 2015-01-31: qty 2

## 2015-01-31 MED ORDER — BUTAMBEN-TETRACAINE-BENZOCAINE 2-2-14 % EX AERO
INHALATION_SPRAY | CUTANEOUS | Status: DC | PRN
  Administered 2015-01-31: 2 via TOPICAL

## 2015-01-31 MED ORDER — MIDAZOLAM HCL 10 MG/2ML IJ SOLN
INTRAMUSCULAR | Status: DC | PRN
  Administered 2015-01-31: 2 mg via INTRAVENOUS
  Administered 2015-01-31: 1 mg via INTRAVENOUS
  Administered 2015-01-31: 2 mg via INTRAVENOUS

## 2015-01-31 MED ORDER — FENTANYL CITRATE 0.05 MG/ML IJ SOLN
INTRAMUSCULAR | Status: DC | PRN
  Administered 2015-01-31 (×2): 25 ug via INTRAVENOUS

## 2015-01-31 MED ORDER — FENTANYL CITRATE 0.05 MG/ML IJ SOLN
INTRAMUSCULAR | Status: AC
Start: 2015-01-31 — End: 2015-01-31
  Filled 2015-01-31: qty 2

## 2015-01-31 SURGICAL SUPPLY — 1 items: TOWEL COTTON PACK 4EA (MISCELLANEOUS) ×4 IMPLANT

## 2015-01-31 NOTE — Progress Notes (Signed)
Patient Demographics  Travis [name], is a 54 y.o. male, DOB - [birth year], [id number]  Admit date - 01/30/2015   Admitting Physician Theressa Millard, MD  Outpatient Primary MD for the patient is London Pepper, MD  LOS - 1   Chief Complaint  Patient presents with  . Shortness of Breath      Admission history of present illness/brief narrative: : Travis [name] is a 54 y.o. male with CKD stage V, reported creatinine to be around 7, HTN and GERD. Patient given to the hospital complaining about SOB and fatigue. Patient said for the past 5-6 month he had right foot blood per rectum comes every now and then when he had bowel movement, denies any history of hemorrhoids. He reported colonoscopy recently which she was being told it was okay. He denies any hematemesis, denies any melanotic stools. For the past several weeks he started to get short of breath, early fatigability and sometimes chest pain on exertion. Patient given to the hospital for further evaluation. In the ED was found to have hemoglobin of 5.2, last hemoglobin was 9.2 in August 2015 and creatinine of 8.9. Patient was transfused one unit in ED, hemoglobin went up to 6.6, patient went endoscopy by Dr. Paulita Fujita, with no evidence of active bleed, only duodenal nodularity which was biopsied, and started capsule endoscopy Subjective:   Travis [name] today has, No headache, No chest pain, No abdominal pain - No Nausea, No new weakness tingling or numbness,   Assessment & Plan    Principal Problem:   Symptomatic anemia Active Problems:   CKD (chronic kidney disease), stage V   Anemia due to chronic blood loss   BRBPR (bright red blood per rectum)   Essential hypertension  Symptomatic anemia Patient has baseline anemia secondary to his CKD with hemoglobin baseline around 9.2 from August 2015. Patient  presented with hemoglobin of 5.2. Patient to be chronic blood loss in the past 5-6 month, BRBPR patient almost describing hemorrhoidal bleed. Getting his second unit packed red blood cell right now, check CBC after the. I'll check ferritin and iron, probably will need iron supplementation.  GI bleed BRBPR for the past 5-6 months, endoscopy in October 2014 showing internal hemorrhoid with polyps. Blood around the stool and when he wipes sometimes, no melanotic stools. GI consult appreciated, status post endoscopy 12/23, no evidence of active bleed, only duodenal nodularity which was biopsied. Currently having Endoscopy Check CBC after transfusion, transfuse as needed  CKD stage V Baseline creatinine is 7.5 , creatinine admission 8.9 Improved with hydration, days 8.2, discussed with nephrology, they will see patient in a.m. Hydrate with IV fluids and check creatinine in the a.m. already had AV fistula created.  Essential hypertension Hemodynamically stable, restart home medications.  Code Status: Full  Family Communication: family at bedside  Disposition Plan: pending workup   Procedures  Endoscopy 2/23 capsule endoscopy 12/23   Consults   Gastroenterology nephrology called, will see patient on 2/24   Medications  Scheduled Meds: . sodium chloride   Intravenous Once  . amLODipine  10 mg Oral Daily  . heparin  5,000 Units Subcutaneous 3 times per day  . hydrALAZINE  10 mg Oral QID  . hydrocortisone  25 mg Rectal  BID  . pantoprazole  40 mg Oral Daily  . sodium chloride  3 mL Intravenous Q12H   Continuous Infusions: . sodium chloride 75 mL/hr at 01/30/15 1806   PRN Meds:.acetaminophen **OR** acetaminophen, HYDROcodone-acetaminophen, morphine injection  DVT Prophylaxis   SCDs , no chemical anticoagulation pending further workup for possible GI bleed.  Lab Results  Component Value Date   PLT 330 01/31/2015    Antibiotics    Anti-infectives    None            Objective:   Filed Vitals:   01/31/15 1336 01/31/15 1520 01/31/15 1558 01/31/15 1633  BP: 166/95 171/94 174/110 163/94  Pulse: 95 91 99 93  Temp: 98 F (36.7 C) 98.6 F (37 C) 99.4 F (37.4 C) 98.8 F (37.1 C)  TempSrc: Oral Oral Oral Oral  Resp: 20 18 20 20   Height:      Weight:      SpO2: 100% 100% 100% 99%    Wt Readings from Last 3 Encounters:  01/30/15 113.5 kg (250 lb 3.6 oz)  08/26/14 116.574 kg (257 lb)  08/11/14 112.492 kg (248 lb)     Intake/Output Summary (Last 24 hours) at 01/31/15 1804 Last data filed at 01/31/15 1618  Gross per 24 hour  Intake 959.58 ml  Output    300 ml  Net 659.58 ml     Physical Exam  Awake Alert, Oriented X 3, No new F.N deficits, Normal affect [place].AT,PERRAL Supple Neck,No JVD, No cervical lymphadenopathy appriciated.  Symmetrical Chest wall movement, Good air movement bilaterally, CTAB RRR,No Gallops,Rubs or new Murmurs, No Parasternal Heave +ve B.Sounds, Abd Soft, No tenderness, No organomegaly appriciated, No rebound - guarding or rigidity. No Cyanosis, Clubbing or edema, No new Rash or bruise     Data Review   Micro Results No results found for this or any previous visit (from the past 240 hour(s)).  Radiology Reports Dg Chest 2 View (if Patient Has Fever And/or Copd)  01/30/2015   CLINICAL DATA:  Shortness of breath. Cough and congestion. Weakness. Symptoms for 4-5 days.  EXAM: CHEST  2 VIEW  COMPARISON:  01/03/2014  FINDINGS: The heart is at the upper limits of normal in size. Pulmonary vasculature is normal. There is no consolidation, pleural effusion, or pneumothorax. No acute osseous abnormalities are seen.  IMPRESSION: Heart at the upper limits of normal in size. No acute pulmonary process.   Electronically Signed   By: Jeb Levering M.D.   On: 01/30/2015 05:40    CBC  Recent Labs Lab 01/30/15 0452 01/30/15 2213 01/31/15 0514  WBC 10.6* 9.1 8.6  HGB 5.2* 6.3* 6.6*  HCT 18.2* 20.3* 21.5*  PLT 357 283  330  MCV 78.8 77.8* 78.2  MCH 22.5* 24.1* 24.0*  MCHC 28.6* 31.0 30.7  RDW 17.3* 16.6* 16.5*    Chemistries   Recent Labs Lab 01/30/15 0452 01/31/15 0514  NA 136 139  K 4.3 4.5  CL 114* 115*  CO2 16* 18*  GLUCOSE 93 88  BUN 49* 41*  CREATININE 8.98* 8.25*  CALCIUM 8.4 8.8   ------------------------------------------------------------------------------------------------------------------ estimated creatinine clearance is 13.4 mL/min (by C-G formula based on Cr of 8.25). ------------------------------------------------------------------------------------------------------------------ No results for input(s): HGBA1C in the last 72 hours. ------------------------------------------------------------------------------------------------------------------ No results for input(s): CHOL, HDL, LDLCALC, TRIG, CHOLHDL, LDLDIRECT in the last 72 hours. ------------------------------------------------------------------------------------------------------------------  Recent Labs  01/30/15 2109  TSH 0.882   ------------------------------------------------------------------------------------------------------------------  Recent Labs  01/30/15 2109  FERRITIN 7*  TIBC 350  IRON  18*    Coagulation profile  Recent Labs Lab 01/30/15 2109  INR 1.13    No results for input(s): DDIMER in the last 72 hours.  Cardiac Enzymes No results for input(s): CKMB, TROPONINI, MYOGLOBIN in the last 168 hours.  Invalid input(s): CK ------------------------------------------------------------------------------------------------------------------ Invalid input(s): POCBNP     Time Spent in minutes   30 minutes   Amilee Janvier M.D on 01/31/2015 at 6:04 PM  Between 7am to 7pm - Pager - [phone]  After 7pm go to [email or web address] - password [email or web address]  And look for the night coverage person covering for me after hours  [facility] Office  [phone]   **Disclaimer: This  note may have been dictated with voice recognition software. Similar sounding words can inadvertently be transcribed and this note may contain transcription errors which may not have been corrected upon publication of note.**

## 2015-01-31 NOTE — Progress Notes (Signed)
Utilization Review Completed.Hallelujah Wysong T2/23/2016  

## 2015-01-31 NOTE — Op Note (Signed)
[facility] [place], [id number]   ENDOSCOPY PROCEDURE REPORT  PATIENT: Travis [name], Travis [name]  MR#: [id number] BIRTHDATE: March 28, 1961 , 53  yrs. old GENDER: male ENDOSCOPIST: [name], MD REFERRED BY:  [facility] PROCEDURE DATE:  2015-02-18 PROCEDURE:  EGD w/ biopsy ASA CLASS:     Class III INDICATIONS:  iron-deficiency anemia. MEDICATIONS: Fentanyl 50 mcg IV and Versed 5 mg IV TOPICAL ANESTHETIC: Cetacaine Spray  DESCRIPTION OF PROCEDURE: After the risks benefits and alternatives of the procedure were thoroughly explained, informed consent was obtained.  The Pentax Gastroscope [id number] endoscope was introduced through the mouth and advanced to the second portion of the duodenum. The instrument was slowly withdrawn as the mucosa was fully examined.    Findings:  Normal esophagus.  Normal stomach.  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  Otherwise normal exam to the second portion of the duodenum.              The scope was then withdrawn from the patient and the procedure completed.  COMPLICATIONS: There were no immediate complications.  ENDOSCOPIC IMPRESSION:     As above.  No explanation for patient's iron-deficiency anemia was identified.  RECOMMENDATIONS:     1.  Watch for potential complications of procedure. 2.  Await biopsy results. 3.  Capsule endoscopy for further evaluation. 4.  No colonoscopy unless capsule endoscopy doesn't yield obvious source for patient's iron-deficiency anemia.  eSigned:  [name], MD 18-Feb-2015 9:46 AM   CC:  CPT CODES: ICD CODES:  The ICD and CPT codes recommended by this software are interpretations from the data that the clinical staff has captured with the software.  The verification of the translation of this report to the ICD and CPT codes and modifiers is the sole responsibility of the health care  institution and practicing physician where this report was generated.  [employer] will not be held responsible for the validity of the ICD and CPT codes included on this report.  AMA assumes no liability for data contained or not contained herein. CPT is a Designer, television/film set of the [employer].

## 2015-01-31 NOTE — Interval H&P Note (Signed)
History and Physical Interval Note:  01/31/2015 9:13 AM  Travis [name]  has presented today for surgery, with the diagnosis of anemia, blood in stool  The various methods of treatment have been discussed with the patient and family. After consideration of risks, benefits and other options for treatment, the patient has consented to  Procedure(s): ESOPHAGOGASTRODUODENOSCOPY (EGD) (Left) as a surgical intervention .  The patient's history has been reviewed, patient examined, no change in status, stable for surgery.  I have reviewed the patient's chart and labs.  Questions were answered to the patient's satisfaction.     Travis [name] M  Assessment:  1.  Iron-deficiency anemia.  Colonoscopy October 2014 unrevealing. 2.  Hematochezia, recurrent, longstanding, felt to be hemorrhoidal.  Plan:  1.  Endoscopy. 2.  Risks (bleeding, infection, bowel perforation that could require surgery, sedation-related changes in cardiopulmonary systems), benefits (identification and possible treatment of source of symptoms, exclusion of certain causes of symptoms), and alternatives (watchful waiting, radiographic imaging studies, empiric medical treatment) of upper endoscopy (EGD) were explained to patient/family in detail and patient wishes to proceed. 3.  If endoscopy is unrevealing, would do capsule endoscopy; if this is unrevealing, would consider repeat colonoscopy.

## 2015-01-31 NOTE — H&P (View-Only) (Signed)
Travis [name] Consultation Note  Referring Provider:  Verlee Monte, MD [facility]) Primary Care Physician:  Travis Pepper, MD Primary Gastroenterologist:  Travis [name]  Reason for Consultation:  Anemia, hematochezia  HPI: Travis [name] is a 54 y.o. male whom we've been asked to see for above complaints.  For the past 6+ months, patient has had intermittent bouts of hematochezia.  He has no abdominal pain, change in bowel habits, nausea, vomiting, hematemesis, dysphagia.  No constipation or fecal straining.  No weight loss.  Had colonoscopy in October 2014 by Dr. Oletta [name] for evaluation of hematochezia, which showed moderate internal hemorrhoids and small polyp.  No NSAIDs.  Has had escalation of renal disease, with evaluation into possible hemodialysis underway.   Past Medical History  Diagnosis Date  . Hypertension   . Chronic kidney disease   . Pneumonia     as a child  . GERD (gastroesophageal reflux disease)     takes Nexium  . Sleep apnea     uses c-pap 2 yrs  . Anemia 01/30/2015    Past Surgical History  Procedure Laterality Date  . Mouth surgery      teeth cleaning  . Colonoscopy    . Bascilic vein transposition Left 01/05/2014    Procedure: LEFT 1ST STAGE BASCILIC VEIN TRANSPOSITION;  Surgeon: Travis Eustis, MD;  Location: [place];  Service: Vascular;  Laterality: Left;  . Bascilic vein transposition Left 07/26/2014    Procedure: Left Arm Brachial Vein Transposition Second Stage;  Surgeon: Travis [place], MD;  Location: [place];  Service: Vascular;  Laterality: Left;    Prior to Admission medications   Medication Sig Start Date End Date Taking? Authorizing Provider  amLODipine (NORVASC) 10 MG tablet Take 10 mg by mouth daily.   Yes Historical Provider, MD  calcitRIOL (ROCALTROL) 0.25 MCG capsule Take 0.25 mcg by mouth every Monday, Wednesday, and Friday.    Yes Historical Provider, MD  esomeprazole (NEXIUM) 40 MG capsule Take 40 mg by mouth daily as needed (for acid  reflux).    Yes Historical Provider, MD  Fexofenadine HCl (MUCINEX ALLERGY PO) Take 2 tablets by mouth 4 (four) times daily as needed (congestion).   Yes Historical Provider, MD  hydrALAZINE (APRESOLINE) 10 MG tablet Take 10 mg by mouth 4 (four) times daily.   Yes Historical Provider, MD  multivitamin (RENA-VIT) TABS tablet Take 1 tablet by mouth daily.   Yes Historical Provider, MD  B Complex-C-Folic Acid (VOL-CARE RX) 1 MG TABS Take 1 tablet by mouth daily.  08/02/14   Historical Provider, MD  oxyCODONE (ROXICODONE) 5 MG immediate release tablet Take 1 tablet (5 mg total) by mouth every 4 (four) hours as needed for severe pain. Patient not taking: Reported on 01/30/2015 07/26/14   Travis Ravenel, MD  VIAGRA 50 MG tablet  08/20/14   Historical Provider, MD    Current Facility-Administered Medications  Medication Dose Route Frequency Provider Last Rate Last Dose  . 0.9 %  sodium chloride infusion   Intravenous Continuous Travis Monte, MD      . acetaminophen (TYLENOL) tablet 650 mg  650 mg Oral Q6H PRN Travis Monte, MD       Or  . acetaminophen (TYLENOL) suppository 650 mg  650 mg Rectal Q6H PRN Travis Monte, MD      . amLODipine (NORVASC) tablet 10 mg  10 mg Oral Daily Travis Monte, MD      . heparin injection 5,000 Units  5,000 Units Subcutaneous 3  times per day Travis Monte, MD      . hydrALAZINE (APRESOLINE) tablet 10 mg  10 mg Oral QID Travis Monte, MD      . HYDROcodone-acetaminophen (NORCO/VICODIN) 5-325 MG per tablet 1-2 tablet  1-2 tablet Oral Q4H PRN Travis Monte, MD      . morphine 2 MG/ML injection 1 mg  1 mg Intravenous Q4H PRN Travis Monte, MD      . pantoprazole (PROTONIX) EC tablet 40 mg  40 mg Oral Daily Travis Elmahi, MD      . sodium chloride 0.9 % injection 3 mL  3 mL Intravenous Q12H Travis Monte, MD        Allergies as of 01/30/2015 - Review Complete 01/30/2015  Allergen Reaction Noted  . Ibuprofen Hives 11/13/2012  . Lisinopril Swelling 05/17/2014  . Naproxen Hives and  Other (See Comments) 07/26/2014    Family History  Problem Relation Age of Onset  . Hypertension Mother   . Hypertension Father   . Hypertension Sister     History   Social History  . Marital Status: Married    Spouse Name: N/A  . Number of Children: N/A  . Years of Education: N/A   Occupational History  . Not on file.   Social History Main Topics  . Smoking status: Former Smoker -- 4 years    Types: Cigars    Quit date: 12/31/2012  . Smokeless tobacco: Never Used  . Alcohol Use: No  . Drug Use: No  . Sexual Activity: Not on file   Other Topics Concern  . Not on file   Social History Narrative    Review of Systems: ROS Dr. Hartford Poli reviewed 01/30/15 and I agree  Physical Exam: Vital signs in last 24 hours: Temp:  [98.2 F (36.8 C)-98.7 F (37.1 C)] 98.3 F (36.8 C) (02/22 0900) Pulse Rate:  [82-103] 86 (02/22 0900) Resp:  [16-18] 18 (02/22 0900) BP: (160-180)/(83-109) 169/102 mmHg (02/22 0900) SpO2:  [89 %-100 %] 99 % (02/22 0900) Weight:  [113.399 kg (250 lb)-113.5 kg (250 lb 3.6 oz)] 113.5 kg (250 lb 3.6 oz) (02/22 0900)   General:   Alert,  Overweight, Well-developed, well-nourished, pleasant and cooperative in NAD Head:  Normocephalic and atraumatic. Eyes:  Sclera clear, no icterus.   Conjunctiva pink. Ears:  Normal auditory acuity. Nose:  No deformity, discharge,  or lesions. Mouth:  No deformity or lesions.  Oropharynx pink & moist. Neck:  Supple; no masses or thyromegaly. Lungs:  Clear throughout to auscultation.   No wheezes, crackles, or rhonchi. No acute distress. Heart:  Regular rate and rhythm; no murmurs, clicks, rubs,  or gallops. Abdomen:  Soft, nontender and nondistended. No masses, hepatosplenomegaly or hernias noted. Normal bowel sounds, without guarding, and without rebound.     Msk:  Symmetrical without gross deformities. Normal posture. Pulses:  Normal pulses noted. Extremities:  Without clubbing or edema. Neurologic:  Alert and   oriented x4;  grossly normal neurologically. Skin:  Intact without significant lesions or rashes. Psych:  Alert and cooperative. Normal mood and affect.  Lab Results:  Recent Labs  01/30/15 0452  WBC 10.6*  HGB 5.2*  HCT 18.2*  PLT 357   BMET  Recent Labs  01/30/15 0452  NA 136  K 4.3  CL 114*  CO2 16*  GLUCOSE 93  BUN 49*  CREATININE 8.98*  CALCIUM 8.4   LFT No results for input(s): PROT, ALBUMIN, AST, ALT, ALKPHOS, BILITOT, BILIDIR, IBILI in the last 72 hours.  PT/INR No results for input(s): LABPROT, INR in the last 72 hours.  Studies/Results: Dg Chest 2 View (if Patient Has Fever And/or Copd)  01/30/2015   CLINICAL DATA:  Shortness of breath. Cough and congestion. Weakness. Symptoms for 4-5 days.  EXAM: CHEST  2 VIEW  COMPARISON:  01/03/2014  FINDINGS: The heart is at the upper limits of normal in size. Pulmonary vasculature is normal. There is no consolidation, pleural effusion, or pneumothorax. No acute osseous abnormalities are seen.  IMPRESSION: Heart at the upper limits of normal in size. No acute pulmonary process.   Electronically Signed   By: Jeb Levering M.D.   On: 01/30/2015 05:40   Impression:  1.  Progressive anemia.  Doubt this is due to the chronic low-grade hematochezia he has apparently been having, on-and-off, for several months.  But hard to attribute all of this to his escalating renal insufficiency, either. 2.  Hematochezia, intermittent for many months.  Colonoscopy also done in October 2014 for evaluation of hematochezia.  Suspect this is hemorrhoid-related.  Plan:  1.  Supportive care including gentle hydration and transfusion. 2.  Endoscopy tomorrow for further investigation into patient's anemia. 3.  Topical therapies (Anusol-HC) for presumed hemorrhoidal bleeding. 4.  If bleeding persists despite conservative therapies, might consider sigmoidoscopy/colonoscopy.  Don't see need for expedited colonoscopy at this point in time, given recent  colonoscopy about 1.5 years ago also for investigation into anemia. 5.  Clear liquid diet, NPO after midnight. 6.  Will follow.     Landry Dyke  01/30/2015, 10:22 AM

## 2015-02-01 ENCOUNTER — Inpatient Hospital Stay (HOSPITAL_COMMUNITY): Payer: BLUE CROSS/BLUE SHIELD

## 2015-02-01 ENCOUNTER — Encounter (HOSPITAL_COMMUNITY): Payer: Self-pay | Admitting: Gastroenterology

## 2015-02-01 LAB — BASIC METABOLIC PANEL
ANION GAP: 7 (ref 5–15)
BUN: 42 mg/dL — ABNORMAL HIGH (ref 6–23)
CO2: 15 mmol/L — AB (ref 19–32)
Calcium: 8.4 mg/dL (ref 8.4–10.5)
Chloride: 115 mmol/L — ABNORMAL HIGH (ref 96–112)
Creatinine, Ser: 8.16 mg/dL — ABNORMAL HIGH (ref 0.50–1.35)
GFR calc Af Amer: 8 mL/min — ABNORMAL LOW (ref >90)
GFR, EST NON AFRICAN AMERICAN: 7 mL/min — AB (ref >90)
Glucose, Bld: 82 mg/dL (ref 70–99)
Potassium: 4.4 mmol/L (ref 3.5–5.1)
SODIUM: 137 mmol/L (ref 135–145)

## 2015-02-01 LAB — CBC
HCT: 27.6 % — ABNORMAL LOW (ref 39.0–52.0)
HCT: 25.9 % — ABNORMAL LOW (ref 39.0–52.0)
HEMOGLOBIN: 8.3 g/dL — AB (ref 13.0–17.0)
Hemoglobin: 8.7 g/dL — ABNORMAL LOW (ref 13.0–17.0)
MCH: 25.2 pg — AB (ref 26.0–34.0)
MCH: 25.5 pg — AB (ref 26.0–34.0)
MCHC: 31.5 g/dL (ref 30.0–36.0)
MCHC: 32 g/dL (ref 30.0–36.0)
MCV: 80 fL (ref 78.0–100.0)
MCV: 79.7 fL (ref 78.0–100.0)
PLATELETS: 334 10*3/uL (ref 150–400)
Platelets: 307 10*3/uL (ref 150–400)
RBC: 3.45 MIL/uL — ABNORMAL LOW (ref 4.22–5.81)
RBC: 3.25 MIL/uL — AB (ref 4.22–5.81)
RDW: 16.4 % — AB (ref 11.5–15.5)
RDW: 16.3 % — ABNORMAL HIGH (ref 11.5–15.5)
WBC: 8.4 10*3/uL (ref 4.0–10.5)
WBC: 8.8 10*3/uL (ref 4.0–10.5)

## 2015-02-01 LAB — TYPE AND SCREEN
ABO/RH(D): O POS
ANTIBODY SCREEN: NEGATIVE
UNIT DIVISION: 0
Unit division: 0
Unit division: 0
Unit division: 0

## 2015-02-01 LAB — HEMOGLOBIN A1C
HEMOGLOBIN A1C: 5.1 % (ref 4.8–5.6)
Mean Plasma Glucose: 100 mg/dL

## 2015-02-01 MED ORDER — BISACODYL 5 MG PO TBEC: 10.0000 mg | DELAYED_RELEASE_TABLET | Freq: Once | ORAL | Status: DC

## 2015-02-01 MED ORDER — PEG 3350-KCL-NA BICARB-NACL 420 G PO SOLR
4000.0000 mL | Freq: Once | ORAL | Status: AC
  Administered 2015-02-01: 4000 mL via ORAL
  Filled 2015-02-01: qty 4000

## 2015-02-01 MED ORDER — LABETALOL HCL 200 MG PO TABS
200.0000 mg | ORAL_TABLET | Freq: Two times a day (BID) | ORAL | Status: DC
  Administered 2015-02-01: 200 mg via ORAL
  Filled 2015-02-01 (×3): qty 1

## 2015-02-01 MED ORDER — HYDRALAZINE HCL 10 MG PO TABS
20.0000 mg | ORAL_TABLET | Freq: Three times a day (TID) | ORAL | Status: DC
  Administered 2015-02-02 (×2): 20 mg via ORAL
  Filled 2015-02-01 (×4): qty 2

## 2015-02-01 NOTE — Progress Notes (Signed)
Subjective: No overt bleeding. Tolerating diet.  Objective: Vital signs in last 24 hours: Temp:  [98 F (36.7 C)-99.4 F (37.4 C)] 99.1 F (37.3 C) (02/24 0439) Pulse Rate:  [87-103] 93 (02/24 0439) Resp:  [14-23] 18 (02/24 0439) BP: (152-195)/(79-110) 152/90 mmHg (02/24 0439) SpO2:  [93 %-100 %] 99 % (02/24 0439) Weight change:  Last BM Date: 02/01/15  PE: GEN:  NAD ABD:  Soft  Lab Results: CBC    Component Value Date/Time   WBC 8.4 02/01/2015 0539   RBC 3.45* 02/01/2015 0539   HGB 8.7* 02/01/2015 0539   HCT 27.6* 02/01/2015 0539   PLT 334 02/01/2015 0539   MCV 80.0 02/01/2015 0539   MCH 25.2* 02/01/2015 0539   MCHC 31.5 02/01/2015 0539   RDW 16.4* 02/01/2015 0539   LYMPHSABS 2.2 11/13/2012 1311   MONOABS 0.5 11/13/2012 1311   EOSABS 0.2 11/13/2012 1311   BASOSABS 0.0 11/13/2012 1311   CMP     Component Value Date/Time   NA 137 02/01/2015 0539   K 4.4 02/01/2015 0539   CL 115* 02/01/2015 0539   CO2 15* 02/01/2015 0539   GLUCOSE 82 02/01/2015 0539   BUN 42* 02/01/2015 0539   CREATININE 8.16* 02/01/2015 0539   CALCIUM 8.4 02/01/2015 0539   PROT 7.5 11/13/2012 1311   ALBUMIN 3.3* 11/13/2012 1311   AST 26 11/13/2012 1311   ALT 28 11/13/2012 1311   ALKPHOS 136* 11/13/2012 1311   BILITOT 0.4 11/13/2012 1311   GFRNONAA 7* 02/01/2015 0539   GFRAA 8* 02/01/2015 0539   Assessment:  1. Progressive anemia. Doubt this is due to the chronic low-grade hematochezia he has apparently been having, on-and-off, for several months. But hard to attribute all of this to his escalating renal insufficiency, either.  Endoscopy unrevealing. 2. Hematochezia, intermittent for many months. Colonoscopy also done in October 2014 for evaluation of hematochezia. Suspect this is hemorrhoid-related.  Plan:  1.  Awaiting duodenal biopsies, but doubt this will shed light on patient's iron-deficiency anemia. 2.  Capsule completed last night; my partner Dr. Michail Sermon is planning to  read this afternoon. 3.  If no obvious source of anemia is seen on capsule endoscopy, would plan colonoscopy tomorrow. 4.  Will follow.   Landry Dyke 02/01/2015, 9:14 AM

## 2015-02-01 NOTE — Consult Note (Signed)
54 year old male followed at [facility] by Dr. 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  Labs on 10/12/15 revealed a BUN of 40 and creat of 7.30m/dl.  He has a known history of a complex right renal cyst [facility].  He is admitted on this occasion due to hematochezia and severe anemia (Hgb to 6.6gm) with SOB.  Colonoscopy 2 years ago was neg except for hemorrhoids.  He denies nausea or vomiting.  Past Medical History  Diagnosis Date  . Hypertension   . Chronic kidney disease   . Pneumonia     as a child  . GERD (gastroesophageal reflux disease)     takes Nexium  . Sleep apnea     uses c-pap 2 yrs  . Anemia 01/30/2015   Past Surgical History  Procedure Laterality Date  . Mouth surgery      teeth cleaning  . Colonoscopy    . Bascilic vein transposition Left 01/05/2014    Procedure: LEFT 1ST STAGE BASCILIC VEIN TRANSPOSITION;  Surgeon: BConrad Hancock MD;  Location[place]  Service: Vascular;  Laterality: Left;  . Bascilic vein transposition Left 07/26/2014    Procedure: Left Arm Brachial Vein Transposition Second Stage;  Surgeon: BConrad Sanostee MD;  [place]  Service: Vascular;  Laterality: Left;  . Esophagogastroduodenoscopy Left 01/31/2015    Procedure: ESOPHAGOGASTRODUODENOSCOPY (EGD);  Surgeon: WArta Silence MD;  [facility]  Service: Endoscopy;  Laterality: Left;  .Freda Munrocapsule study N/A 01/31/2015    Procedure: GIVENS CAPSULE STUDY;  Surgeon: WArta Silence MD;  [facility]  Service: Endoscopy;  Laterality: N/A;   Social History:  reports that he quit smoking about 2 years ago. His smoking use included Cigars. He has never used smokeless tobacco. He reports that he does not drink alcohol or use illicit drugs. Allergies:  Allergies  Allergen Reactions  . Ibuprofen Hives  . Lisinopril Swelling    PT states he is allergic to all prils; caused facial swelling  . Naproxen Hives and  Other (See Comments)    Alleve causes patient to have hives   Family History  Problem Relation Age of Onset  . Hypertension Mother   . Hypertension Father   . Hypertension Sister     Medications:  Scheduled: . amLODipine  10 mg Oral Daily  . bisacodyl  10 mg Oral Once  . hydrALAZINE  10 mg Oral QID  . hydrocortisone  25 mg Rectal BID  . pantoprazole  40 mg Oral Daily  . sodium chloride  3 mL Intravenous Q12H   ROS: overweight but otherwise as per HPI  Blood pressure 179/100, pulse 97, temperature 98.2 F (36.8 C), temperature source Oral, resp. rate 18, height 5' 11"  (1.803 m), weight 113.5 kg (250 lb 3.6 oz), SpO2 97 %.  General appearance: alert and cooperative Head: Normocephalic, without obvious abnormality, atraumatic Eyes: negative Throat: lips, mucosa, and tongue normal; teeth and gums normal Resp: clear to auscultation bilaterally Chest wall: no tenderness Cardio: regular rate and rhythm, S1, S2 normal, no murmur, click, rub or gallop GI: soft, non-tender; bowel sounds normal; no masses,  no organomegaly Extremities: extremities normal, atraumatic, no cyanosis or edema, BVT LUE looks usable Skin: Skin color, texture, turgor normal. No rashes or lesions Neurologic: Grossly normal Results for orders placed or performed during the hospital encounter of 01/30/15 (from the past 48 hour(s))  Iron and  TIBC     Status: Abnormal   Collection Time: 01/30/15  9:09 PM  Result Value Ref Range   Iron 18 (L) 42 - 165 ug/dL   TIBC 350 215 - 435 ug/dL   Saturation Ratios 5 (L) 20 - 55 %   UIBC 332 125 - 400 ug/dL    Comment: Performed at [facility]  Ferritin     Status: Abnormal   Collection Time: 01/30/15  9:09 PM  Result Value Ref Range   Ferritin 7 (L) 22 - 322 ng/mL    Comment: Performed at [facility]     Status: None   Collection Time: 01/30/15  9:09 PM  Result Value Ref Range   Prothrombin Time 14.7 11.6 - 15.2 seconds   INR 1.13 0.00  - 1.49  TSH     Status: None   Collection Time: 01/30/15  9:09 PM  Result Value Ref Range   TSH 0.882 0.350 - 4.500 uIU/mL  Hemoglobin A1c     Status: None   Collection Time: 01/30/15  9:09 PM  Result Value Ref Range   Hgb A1c MFr Bld 5.1 4.8 - 5.6 %    Comment: (NOTE)         Pre-diabetes: 5.7 - 6.4         Diabetes: >6.4         Glycemic control for adults with diabetes: <7.0    Mean Plasma Glucose 100 mg/dL    Comment: (NOTE) Performed At: [facility] [place], [place] [id number] Lindon Romp MD [id number]   CBC     Status: Abnormal   Collection Time: 01/30/15 10:13 PM  Result Value Ref Range   WBC 9.1 4.0 - 10.5 K/uL   RBC 2.61 (L) 4.22 - 5.81 MIL/uL   Hemoglobin 6.3 (LL) 13.0 - 17.0 g/dL    Comment: DELTA CHECK NOTED REPEATED TO VERIFY POST TRANSFUSION SPECIMEN CRITICAL VALUE NOTED.  VALUE IS CONSISTENT WITH PREVIOUSLY REPORTED AND CALLED VALUE.    HCT 20.3 (L) 39.0 - 52.0 %   MCV 77.8 (L) 78.0 - 100.0 fL   MCH 24.1 (L) 26.0 - 34.0 pg   MCHC 31.0 30.0 - 36.0 g/dL   RDW 16.6 (H) 11.5 - 15.5 %   Platelets 283 150 - 400 K/uL  Basic metabolic panel     Status: Abnormal   Collection Time: 01/31/15  5:14 AM  Result Value Ref Range   Sodium 139 135 - 145 mmol/L   Potassium 4.5 3.5 - 5.1 mmol/L   Chloride 115 (H) 96 - 112 mmol/L   CO2 18 (L) 19 - 32 mmol/L   Glucose, Bld 88 70 - 99 mg/dL   BUN 41 (H) 6 - 23 mg/dL   Creatinine, Ser 8.25 (H) 0.50 - 1.35 mg/dL   Calcium 8.8 8.4 - 10.5 mg/dL   GFR calc non Af Amer 7 (L) >90 mL/min   GFR calc Af Amer 8 (L) >90 mL/min    Comment: (NOTE) The eGFR has been calculated using the CKD EPI equation. This calculation has not been validated in all clinical situations. eGFR's persistently <90 mL/min signify possible Chronic Kidney Disease.    Anion gap 6 5 - 15  CBC     Status: Abnormal   Collection Time: 01/31/15  5:14 AM  Result Value Ref Range   WBC 8.6 4.0 - 10.5 K/uL   RBC 2.75 (L) 4.22 -  5.81 MIL/uL   Hemoglobin 6.6 (  LL) 13.0 - 17.0 g/dL    Comment: REPEATED TO VERIFY CRITICAL VALUE NOTED.  VALUE IS CONSISTENT WITH PREVIOUSLY REPORTED AND CALLED VALUE.    HCT 21.5 (L) 39.0 - 52.0 %   MCV 78.2 78.0 - 100.0 fL   MCH 24.0 (L) 26.0 - 34.0 pg   MCHC 30.7 30.0 - 36.0 g/dL   RDW 16.5 (H) 11.5 - 15.5 %   Platelets 330 150 - 400 K/uL  Prepare RBC     Status: None   Collection Time: 01/31/15  7:45 AM  Result Value Ref Range   Order Confirmation ORDER PROCESSED BY BLOOD BANK   CBC     Status: Abnormal   Collection Time: 01/31/15  9:28 PM  Result Value Ref Range   WBC 9.5 4.0 - 10.5 K/uL   RBC 3.52 (L) 4.22 - 5.81 MIL/uL   Hemoglobin 8.9 (L) 13.0 - 17.0 g/dL    Comment: POST TRANSFUSION SPECIMEN   HCT 28.2 (L) 39.0 - 52.0 %   MCV 80.1 78.0 - 100.0 fL   MCH 25.3 (L) 26.0 - 34.0 pg   MCHC 31.6 30.0 - 36.0 g/dL   RDW 16.5 (H) 11.5 - 15.5 %   Platelets 351 150 - 400 K/uL  Basic metabolic panel     Status: Abnormal   Collection Time: 02/01/15  5:39 AM  Result Value Ref Range   Sodium 137 135 - 145 mmol/L   Potassium 4.4 3.5 - 5.1 mmol/L   Chloride 115 (H) 96 - 112 mmol/L   CO2 15 (L) 19 - 32 mmol/L   Glucose, Bld 82 70 - 99 mg/dL   BUN 42 (H) 6 - 23 mg/dL   Creatinine, Ser 8.16 (H) 0.50 - 1.35 mg/dL   Calcium 8.4 8.4 - 10.5 mg/dL   GFR calc non Af Amer 7 (L) >90 mL/min   GFR calc Af Amer 8 (L) >90 mL/min    Comment: (NOTE) The eGFR has been calculated using the CKD EPI equation. This calculation has not been validated in all clinical situations. eGFR's persistently <90 mL/min signify possible Chronic Kidney Disease.    Anion gap 7 5 - 15  CBC     Status: Abnormal   Collection Time: 02/01/15  5:39 AM  Result Value Ref Range   WBC 8.4 4.0 - 10.5 K/uL   RBC 3.45 (L) 4.22 - 5.81 MIL/uL   Hemoglobin 8.7 (L) 13.0 - 17.0 g/dL   HCT 27.6 (L) 39.0 - 52.0 %   MCV 80.0 78.0 - 100.0 fL   MCH 25.2 (L) 26.0 - 34.0 pg   MCHC 31.5 30.0 - 36.0 g/dL   RDW 16.4 (H) 11.5 - 15.5  %   Platelets 334 150 - 400 K/uL   US Renal  02/01/2015   CLINICAL DATA:  History of right renal complex cyst.  EXAM: RENAL/URINARY TRACT ULTRASOUND COMPLETE  COMPARISON:  Abdomen ultrasound, 05/24/2005  FINDINGS: Right Kidney:  Length: 9.9 cm. Diffuse increased parenchymal echogenicity. Hypoechoic lesion with some internal echoes projects from the upper pole of the right kidney. It measures 2.3 cm x 1.9 cm x 3.0 cm. 7 mm hypoechoic anechoic lesion in the midpole the right kidney and 7 mm hypoechoic anechoic lesion in the lower pole of the right kidney. No stones. No hydronephrosis.  Left Kidney:  Length: 10.7 cm. Diffuse increased parenchymal echogenicity. 13 mm cyst along the peripheral cortex of the midpole. No solid mass. No hydronephrosis.  17 mm hypoechoic area noted in the inferior  margin of the right lobe of the liver.  Bladder:  Appears normal for degree of bladder distention.  IMPRESSION: 1. No acute findings.  No hydronephrosis. 2. Increased renal parenchymal echogenicity consistent with medical renal disease. 3. 3 cm cystic lesion in the upper pole of the right kidney. This is not a simple cyst. Further evaluation with pre and post contrast MRI or CT should be considered. MRI is preferred in younger patients (due to lack of ionizing radiation) and for evaluating calcified lesion(s). 4. Two other small hypo to anechoic lesions in the right kidney are likely simple cyst. 13 mm simple cyst arises from the left kidney. 5. 17 mm hypoechoic liver lesion from the inferior right lobe. This also could be further assessed with MRI.   Electronically Signed   By: Lajean Manes M.D.   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  I really think it does not make any sense to delay nephrectomy with potential malignancy 2 He will need ESA tx for anemia  Malaysha Arlen C 02/01/2015, 5:31 PM

## 2015-02-01 NOTE — Progress Notes (Signed)
Pt had a BM @ approximately 1935 & passed Capsule. Capsule set taken down @ 2220. No acute distress noted.

## 2015-02-01 NOTE — Progress Notes (Signed)
Patient Demographics  Travis [name], is a 54 y.o. male, DOB - 1961-03-23, [id number]  Admit date - 01/30/2015   Admitting Physician Theressa Millard, MD  Outpatient Primary MD for the patient is London Pepper, MD  LOS - 2   Chief Complaint  Patient presents with  . Shortness of Breath      Admission history of present illness/brief narrative: : Travis [name] is a 54 y.o. male with CKD stage V, reported creatinine to be around 7, HTN and GERD. Patient given to the hospital complaining about SOB and fatigue. Patient said for the past 5-6 month he had right foot blood per rectum comes every now and then when he had bowel movement, denies any history of hemorrhoids. He reported colonoscopy recently which she was being told it was okay. He denies any hematemesis, denies any melanotic stools. For the past several weeks he started to get short of breath, early fatigability and sometimes chest pain on exertion. Patient given to the hospital for further evaluation. In the ED was found to have hemoglobin of 5.2, last hemoglobin was 9.2 in August 2015 and creatinine of 8.9. Patient was transfused one unit in ED, hemoglobin went up to 6.6, patient went endoscopy by Dr. Paulita Fujita, with no evidence of active bleed, only duodenal nodularity which was biopsied. Capsule endoscopy demonstrating just mild gastritis and duodenitis without any masses or major source of bleeding. Plan is for colonoscopy on the 25th  Subjective:   Travis [name] afebrile. No chest pain, no shortness of breath; just feeling weak.  Assessment & Plan    Principal Problem:   Symptomatic anemia Active Problems:   CKD (chronic kidney disease), stage V   Anemia due to chronic blood loss   BRBPR (bright red blood per rectum)   Essential hypertension  Symptomatic anemia (appears to be anemia of  chronic kidney disease with superimposed GI bleed) -Patient has baseline anemia secondary to his CKD with hemoglobin baseline around 9.2 from August 2015. -Patient presented with hemoglobin of 5.2. He is a status post 4 units of PRBCs and current hemoglobin level is 8.3 -Patient to be chronic blood loss in the past 5-6 month -Hemodynamically stable  GI bleed -BRBPR for the past 5-6 months, endoscopy in October 2014 showing internal hemorrhoid with polyps. -After 4 units of PRBCs patient hemoglobin is a stable and he has not presented melanotic stools or frank bleeding while inpatient -EGD and capsule endoscopy demonstrating just mild gastritis and duodenitis without any major source of bleeding -Plan is for colonoscopy on the 25th to complete workup -Will monitor hemoglobin trend -Continue PPI  CKD stage V -Nephrology actively following patient and will follow recommendations Baseline creatinine is 7.5 , -creatinine admission 8.9 and has remained in the 8.3-8.6 range -Patient needs ESA and IV iron as per nephrology discretion   Essential hypertension -Blood pressure uncontrolled -Will adjust hydralazine dose to 20 mg 3 times a day and add labetalol  Code Status: Full  Family Communication: family at bedside  Disposition Plan: pending workup   Procedures  Endoscopy 2/23 capsule endoscopy 12/23 Colonoscopy to 2/25  Consults   Gastroenterology Renal service  Medications  Scheduled Meds: . amLODipine  10 mg Oral Daily  . bisacodyl  10  mg Oral Once  . hydrALAZINE  10 mg Oral QID  . hydrocortisone  25 mg Rectal BID  . pantoprazole  40 mg Oral Daily  . sodium chloride  3 mL Intravenous Q12H   Continuous Infusions: . sodium chloride 75 mL/hr at 02/01/15 1342   PRN Meds:.acetaminophen **OR** acetaminophen, HYDROcodone-acetaminophen, morphine injection  DVT Prophylaxis   SCDs , no chemical anticoagulation pending further workup for possible GI bleed.  Lab Results    Component Value Date   PLT 307 02/01/2015    Antibiotics  : None  Anti-infectives    None      Objective:   Filed Vitals:   01/31/15 2113 02/01/15 0439 02/01/15 1027 02/01/15 1335  BP: 158/89 152/90 167/94 179/100  Pulse: 96 93  97  Temp: 98 F (36.7 C) 99.1 F (37.3 C)  98.2 F (36.8 C)  TempSrc: Oral Oral  Oral  Resp: 18 18  18   Height:      Weight:      SpO2: 97% 99%  97%    Wt Readings from Last 3 Encounters:  01/30/15 113.5 kg (250 lb 3.6 oz)  08/26/14 116.574 kg (257 lb)  08/11/14 112.492 kg (248 lb)     Intake/Output Summary (Last 24 hours) at 02/01/15 1833 Last data filed at 02/01/15 1300  Gross per 24 hour  Intake 3732.5 ml  Output      0 ml  Net 3732.5 ml     Physical Exam Gen.: Awake Alert, Oriented X 3, No new F.N deficits, Normal affect Lungs:Good air movement bilaterally, CTAB Heart: RRR,No Gallops,Rubs; positive systolic murmur Abdomen: + Bowel Sounds, Abd Soft, No tenderness, No organomegaly appriciated, No rebound - guarding or rigidity. Lower extremities: No Cyanosis, Clubbing or edema,  Skin: No new Rash or bruise    Radiology Reports US Renal  02/01/2015   CLINICAL DATA:  History of right renal complex cyst.  EXAM: RENAL/URINARY TRACT ULTRASOUND COMPLETE  COMPARISON:  Abdomen ultrasound, 05/24/2005  FINDINGS: Right Kidney:  Length: 9.9 cm. Diffuse increased parenchymal echogenicity. Hypoechoic lesion with some internal echoes projects from the upper pole of the right kidney. It measures 2.3 cm x 1.9 cm x 3.0 cm. 7 mm hypoechoic anechoic lesion in the midpole the right kidney and 7 mm hypoechoic anechoic lesion in the lower pole of the right kidney. No stones. No hydronephrosis.  Left Kidney:  Length: 10.7 cm. Diffuse increased parenchymal echogenicity. 13 mm cyst along the peripheral cortex of the midpole. No solid mass. No hydronephrosis.  17 mm hypoechoic area noted in the inferior margin of the right lobe of the liver.  Bladder:  Appears  normal for degree of bladder distention.  IMPRESSION: 1. No acute findings.  No hydronephrosis. 2. Increased renal parenchymal echogenicity consistent with medical renal disease. 3. 3 cm cystic lesion in the upper pole of the right kidney. This is not a simple cyst. Further evaluation with pre and post contrast MRI or CT should be considered. MRI is preferred in younger patients (due to lack of ionizing radiation) and for evaluating calcified lesion(s). 4. Two other small hypo to anechoic lesions in the right kidney are likely simple cyst. 13 mm simple cyst arises from the left kidney. 5. 17 mm hypoechoic liver lesion from the inferior right lobe. This also could be further assessed with MRI.   Electronically Signed   By: Lajean Manes M.D.   On: 02/01/2015 15:10    CBC  Recent Labs Lab 01/30/15 2213 01/31/15 0514 01/31/15  2128 02/01/15 0539 02/01/15 1708  WBC 9.1 8.6 9.5 8.4 8.8  HGB 6.3* 6.6* 8.9* 8.7* 8.3*  HCT 20.3* 21.5* 28.2* 27.6* 25.9*  PLT 283 330 351 334 307  MCV 77.8* 78.2 80.1 80.0 79.7  MCH 24.1* 24.0* 25.3* 25.2* 25.5*  MCHC 31.0 30.7 31.6 31.5 32.0  RDW 16.6* 16.5* 16.5* 16.4* 16.3*    Chemistries   Recent Labs Lab 01/30/15 0452 01/31/15 0514 02/01/15 0539  NA 136 139 137  K 4.3 4.5 4.4  CL 114* 115* 115*  CO2 16* 18* 15*  GLUCOSE 93 88 82  BUN 49* 41* 42*  CREATININE 8.98* 8.25* 8.16*  CALCIUM 8.4 8.8 8.4     Recent Labs  01/30/15 2109  HGBA1C 5.1     Recent Labs  01/30/15 2109  TSH 0.882   ------------------------------------------------------------------------------------------------------------------  Recent Labs  01/30/15 2109  FERRITIN 7*  TIBC 350  IRON 18*    Coagulation profile  Recent Labs Lab 01/30/15 2109  INR 1.13    Time Spent in minutes   30 minutes   [name] M.D on 02/01/2015 at 6:33 PM  Between 7am to 7pm - Pager - [phone]  After 7pm go to [email or web address] - password [email or web address]  And look for the night  coverage person covering for me after hours  [facility] Office  [phone]

## 2015-02-01 NOTE — Progress Notes (Signed)
Patient ID: Travis [name], male   DOB: 1961-02-11, 54 y.o.   MRN: [id number]  Capsule endoscopy showed minimal gastritis and mild duodenitis. No ulcers or mass lesions seen. No source of anemia noted. Complete capsule endoscopy report to be scanned. Dr. Paulita Fujita aware and planning to do a colonoscopy as the next step.

## 2015-02-02 ENCOUNTER — Encounter (HOSPITAL_COMMUNITY): Payer: Self-pay | Admitting: *Deleted

## 2015-02-02 ENCOUNTER — Encounter (HOSPITAL_COMMUNITY)
Admission: EM | Disposition: A | Discharge status: Home / Self Care | Payer: Self-pay | Source: Home / Self Care | Attending: Internal Medicine

## 2015-02-02 DIAGNOSIS — IMO0002 Reserved for concepts with insufficient information to code with codable children: Secondary | ICD-10-CM | POA: Insufficient documentation

## 2015-02-02 DIAGNOSIS — R229 Localized swelling, mass and lump, unspecified: Secondary | ICD-10-CM

## 2015-02-02 HISTORY — PX: COLONOSCOPY: SHX5424

## 2015-02-02 LAB — BASIC METABOLIC PANEL
Anion gap: 8 (ref 5–15)
BUN: 38 mg/dL — ABNORMAL HIGH (ref 6–23)
CALCIUM: 8.9 mg/dL (ref 8.4–10.5)
CO2: 16 mmol/L — ABNORMAL LOW (ref 19–32)
CREATININE: 8.18 mg/dL — AB (ref 0.50–1.35)
Chloride: 115 mmol/L — ABNORMAL HIGH (ref 96–112)
GFR calc Af Amer: 8 mL/min — ABNORMAL LOW (ref >90)
GFR calc non Af Amer: 7 mL/min — ABNORMAL LOW (ref >90)
Glucose, Bld: 88 mg/dL (ref 70–99)
Potassium: 4.4 mmol/L (ref 3.5–5.1)
Sodium: 139 mmol/L (ref 135–145)

## 2015-02-02 LAB — CBC
HEMATOCRIT: 26.5 % — AB (ref 39.0–52.0)
Hemoglobin: 8.3 g/dL — ABNORMAL LOW (ref 13.0–17.0)
MCH: 25.1 pg — ABNORMAL LOW (ref 26.0–34.0)
MCHC: 31.3 g/dL (ref 30.0–36.0)
MCV: 80.1 fL (ref 78.0–100.0)
Platelets: 325 10*3/uL (ref 150–400)
RBC: 3.31 MIL/uL — ABNORMAL LOW (ref 4.22–5.81)
RDW: 16.5 % — ABNORMAL HIGH (ref 11.5–15.5)
WBC: 9 10*3/uL (ref 4.0–10.5)

## 2015-02-02 SURGERY — COLONOSCOPY
Anesthesia: Moderate Sedation

## 2015-02-02 MED ORDER — HYDRALAZINE HCL 25 MG PO TABS: 25.0000 mg | ORAL_TABLET | Freq: Three times a day (TID) | ORAL | Status: DC

## 2015-02-02 MED ORDER — LABETALOL HCL 300 MG PO TABS: 300.0000 mg | ORAL_TABLET | Freq: Two times a day (BID) | ORAL | Status: DC

## 2015-02-02 MED ORDER — POLYSACCHARIDE IRON COMPLEX 150 MG PO CAPS: 150.0000 mg | ORAL_CAPSULE | Freq: Two times a day (BID) | ORAL | Status: DC

## 2015-02-02 MED ORDER — LABETALOL HCL 300 MG PO TABS
300.0000 mg | ORAL_TABLET | Freq: Two times a day (BID) | ORAL | Status: DC
  Administered 2015-02-02: 300 mg via ORAL
  Filled 2015-02-02 (×2): qty 1

## 2015-02-02 MED ORDER — SODIUM CHLORIDE 0.9 % IV SOLN
125.0000 mg | Freq: Every day | INTRAVENOUS | Status: DC
  Administered 2015-02-02: 125 mg via INTRAVENOUS
  Filled 2015-02-02: qty 10

## 2015-02-02 MED ORDER — SODIUM CHLORIDE 0.9 % IV SOLN
25.0000 mg | Freq: Once | INTRAVENOUS | Status: AC
Start: 2015-02-02 — End: 2015-02-02
  Administered 2015-02-02: 25 mg via INTRAVENOUS
  Filled 2015-02-02: qty 2

## 2015-02-02 MED ORDER — MIDAZOLAM HCL 5 MG/5ML IJ SOLN
INTRAMUSCULAR | Status: DC | PRN
  Administered 2015-02-02 (×3): 2 mg via INTRAVENOUS

## 2015-02-02 MED ORDER — FENTANYL CITRATE 0.05 MG/ML IJ SOLN
INTRAMUSCULAR | Status: AC
  Filled 2015-02-02: qty 2

## 2015-02-02 MED ORDER — MIDAZOLAM HCL 5 MG/ML IJ SOLN
INTRAMUSCULAR | Status: AC
  Filled 2015-02-02: qty 2

## 2015-02-02 MED ORDER — DIPHENHYDRAMINE HCL 50 MG/ML IJ SOLN
INTRAMUSCULAR | Status: AC
  Filled 2015-02-02: qty 1

## 2015-02-02 MED ORDER — HYDROCORTISONE ACETATE 25 MG RE SUPP: 25.0000 mg | Freq: Two times a day (BID) | RECTAL | Status: DC | PRN

## 2015-02-02 MED ORDER — FENTANYL CITRATE 0.05 MG/ML IJ SOLN
INTRAMUSCULAR | Status: DC | PRN
  Administered 2015-02-02 (×2): 25 ug via INTRAVENOUS

## 2015-02-02 MED ORDER — SODIUM CHLORIDE 0.9 % IV SOLN: INTRAVENOUS | Status: DC

## 2015-02-02 MED ORDER — SODIUM CHLORIDE 0.9 % IV SOLN: 25.0000 mg | Freq: Once | INTRAVENOUS | Status: DC

## 2015-02-02 NOTE — Progress Notes (Signed)
1 Stage V CKD 2 Complex cyst right kidney, 3cm 3 Anemia, iron deficient 4 Hypertension, suboptimal control Plan: 1 Increase labetolol 2 IV iron 3 He needs to get  The nephrectomy and I will call him to discuss this.   I spoke to his wife this AM.  Subjective: Interval History: Currently at colonoscopy  Objective: Vital signs in last 24 hours: Temp:  [98 F (36.7 C)-98.7 F (37.1 C)] 98 F (36.7 C) (02/25 0800) Pulse Rate:  [84-97] 84 (02/25 0800) Resp:  [18-20] 20 (02/25 0800) BP: (150-197)/(85-101) 197/101 mmHg (02/25 0800) SpO2:  [94 %-97 %] 97 % (02/25 0800) Weight change:   Intake/Output from previous day: 02/24 0701 - 02/25 0700 In: [id number] [P.O.:720; I.V.:825] Out: -  Intake/Output this shift:    NE  Lab Results:  Recent Labs  02/01/15 1708 02/02/15 0340  WBC 8.8 9.0  HGB 8.3* 8.3*  HCT 25.9* 26.5*  PLT 307 325   BMET:  Recent Labs  02/01/15 0539 02/02/15 0340  NA 137 139  K 4.4 4.4  CL 115* 115*  CO2 15* 16*  GLUCOSE 82 88  BUN 42* 38*  CREATININE 8.16* 8.18*  CALCIUM 8.4 8.9   No results for input(s): PTH in the last 72 hours. Iron Studies:  Recent Labs  01/30/15 2109  IRON 18*  TIBC 350  FERRITIN 7*   Studies/Results: US Renal  02/01/2015   CLINICAL DATA:  History of right renal complex cyst.  EXAM: RENAL/URINARY TRACT ULTRASOUND COMPLETE  COMPARISON:  Abdomen ultrasound, 05/24/2005  FINDINGS: Right Kidney:  Length: 9.9 cm. Diffuse increased parenchymal echogenicity. Hypoechoic lesion with some internal echoes projects from the upper pole of the right kidney. It measures 2.3 cm x 1.9 cm x 3.0 cm. 7 mm hypoechoic anechoic lesion in the midpole the right kidney and 7 mm hypoechoic anechoic lesion in the lower pole of the right kidney. No stones. No hydronephrosis.  Left Kidney:  Length: 10.7 cm. Diffuse increased parenchymal echogenicity. 13 mm cyst along the peripheral cortex of the midpole. No solid mass. No hydronephrosis.  17 mm hypoechoic  area noted in the inferior margin of the right lobe of the liver.  Bladder:  Appears normal for degree of bladder distention.  IMPRESSION: 1. No acute findings.  No hydronephrosis. 2. Increased renal parenchymal echogenicity consistent with medical renal disease. 3. 3 cm cystic lesion in the upper pole of the right kidney. This is not a simple cyst. Further evaluation with pre and post contrast MRI or CT should be considered. MRI is preferred in younger patients (due to lack of ionizing radiation) and for evaluating calcified lesion(s). 4. Two other small hypo to anechoic lesions in the right kidney are likely simple cyst. 13 mm simple cyst arises from the left kidney. 5. 17 mm hypoechoic liver lesion from the inferior right lobe. This also could be further assessed with MRI.   Electronically Signed   By: Lajean Manes M.D.   On: 02/01/2015 15:10     LOS: 3 days   Drakkar Medeiros C 02/02/2015,8:30 AM  \

## 2015-02-02 NOTE — H&P (View-Only) (Signed)
Subjective: No overt bleeding. Tolerating diet.  Objective: Vital signs in last 24 hours: Temp:  [98 F (36.7 C)-99.4 F (37.4 C)] 99.1 F (37.3 C) (02/24 0439) Pulse Rate:  [87-103] 93 (02/24 0439) Resp:  [14-23] 18 (02/24 0439) BP: (152-195)/(79-110) 152/90 mmHg (02/24 0439) SpO2:  [93 %-100 %] 99 % (02/24 0439) Weight change:  Last BM Date: 02/01/15  PE: GEN:  NAD ABD:  Soft  Lab Results: CBC    Component Value Date/Time   WBC 8.4 02/01/2015 0539   RBC 3.45* 02/01/2015 0539   HGB 8.7* 02/01/2015 0539   HCT 27.6* 02/01/2015 0539   PLT 334 02/01/2015 0539   MCV 80.0 02/01/2015 0539   MCH 25.2* 02/01/2015 0539   MCHC 31.5 02/01/2015 0539   RDW 16.4* 02/01/2015 0539   LYMPHSABS 2.2 11/13/2012 1311   MONOABS 0.5 11/13/2012 1311   EOSABS 0.2 11/13/2012 1311   BASOSABS 0.0 11/13/2012 1311   CMP     Component Value Date/Time   NA 137 02/01/2015 0539   K 4.4 02/01/2015 0539   CL 115* 02/01/2015 0539   CO2 15* 02/01/2015 0539   GLUCOSE 82 02/01/2015 0539   BUN 42* 02/01/2015 0539   CREATININE 8.16* 02/01/2015 0539   CALCIUM 8.4 02/01/2015 0539   PROT 7.5 11/13/2012 1311   ALBUMIN 3.3* 11/13/2012 1311   AST 26 11/13/2012 1311   ALT 28 11/13/2012 1311   ALKPHOS 136* 11/13/2012 1311   BILITOT 0.4 11/13/2012 1311   GFRNONAA 7* 02/01/2015 0539   GFRAA 8* 02/01/2015 0539   Assessment:  1. Progressive anemia. Doubt this is due to the chronic low-grade hematochezia he has apparently been having, on-and-off, for several months. But hard to attribute all of this to his escalating renal insufficiency, either.  Endoscopy unrevealing. 2. Hematochezia, intermittent for many months. Colonoscopy also done in October 2014 for evaluation of hematochezia. Suspect this is hemorrhoid-related.  Plan:  1.  Awaiting duodenal biopsies, but doubt this will shed light on patient's iron-deficiency anemia. 2.  Capsule completed last night; my partner Dr. Michail Sermon is planning to  read this afternoon. 3.  If no obvious source of anemia is seen on capsule endoscopy, would plan colonoscopy tomorrow. 4.  Will follow.   Landry Dyke 02/01/2015, 9:14 AM

## 2015-02-02 NOTE — Op Note (Signed)
[facility] [place], [id number]   COLONOSCOPY PROCEDURE REPORT  PATIENT: Travis [name], Travis [name]  MR#: [id number] BIRTHDATE: [birth year] , 53  yrs. old GENDER: male ENDOSCOPIST: [name], MD REFERRED [id number] [facility] PROCEDURE DATE:  24-Feb-2015 PROCEDURE:   Colonoscopy, diagnostic ASA CLASS:   Class III INDICATIONS:iron deficiency anemia, hematochezia. MEDICATIONS: Fentanyl 50 mcg IV and Versed 6 mg IV  DESCRIPTION OF PROCEDURE:   After the risks benefits and alternatives of the procedure were thoroughly explained, informed consent was obtained.  Digital rectal exam revealed no abnormalities of the rectum.   The pediatric colonoscope was introduced through the anus and advanced to the cecum, which was identified by both the appendix and ileocecal valve. No adverse events experienced.   The quality of the prep was adequate  The instrument was then slowly withdrawn as the colon was fully examined.    Findings:  Digital rectal exam was normal.  Prep quality was adequate.  Mild internal hemorrhoids, otherwise normal retroflexed view of rectum.  Few scattered medium-sized diverticula seen throughout the colon, most pronounced in right colon.  No polyps, masses, vascular ectasias, or inflammatory changes were seen. Withdrawal time was   .  The scope was withdrawn and the procedure completed.  COMPLICATIONS:  ENDOSCOPIC IMPRESSION:     As above.  No explanation for iron-deficiency anemia was seen on endoscopy, capsule endoscopy and today's colonoscopy.  RECOMMENDATIONS:     1.  Watch for potential complications of procedure. 2.  Topical therapies (e.g., Preparation-H, Anusol-HC) for low-grade intermittent rectal bleeding, which I very much doubt is leading to patient's anemia. 3.  No further GI work-up for patient's anemia is anticipated. 4.  Advance diet as tolerated. 5.  Repeat colonoscopy in 5 years (prior history of colon  polyps). 6.  Will sign-off; please call with questions; thank you for the consult.  eSigned:  [name], MD February 24, 2015 9:41 AM   cc:  CPT CODES: ICD CODES:  The ICD and CPT codes recommended by this software are interpretations from the data that the clinical staff has captured with the software.  The verification of the translation of this report to the ICD and CPT codes and modifiers is the sole responsibility of the health care institution and practicing physician where this report was generated.  [employer] will not be held responsible for the validity of the ICD and CPT codes included on this report.  AMA assumes no liability for data contained or not contained herein. CPT is a Designer, television/film set of the [employer].

## 2015-02-02 NOTE — Progress Notes (Addendum)
Pt discharged home with wife Discharge instructions given & reviewed Education discussed  IV dc'd  Tele dc'd  Pt discharged via wheelchair with RN All patient belongs at side.   Sherrie Mustache 6:09 PM

## 2015-02-02 NOTE — Discharge Summary (Signed)
Physician Discharge Summary  Travis [name] [id number] DOB: Apr 20, 1961 DOA: 01/30/2015  PCP: London Pepper, MD  Admit date: 01/30/2015 Discharge date: 02/02/2015  Time spent: >30 minutes  Recommendations for Outpatient Follow-up:  Reassess BP and adjust medications as needed Check CBC to follow Hgb trend Renal  Service to initiate ESA therapy and continue IV iron as an outpatient  Discharge Diagnoses:  Principal Problem:   Symptomatic anemia Active Problems:   CKD (chronic kidney disease), stage V   Anemia due to chronic blood loss   BRBPR (bright red blood per rectum)   Essential hypertension (sub-optimal control)   GERD (with duodenitis)   Right renal mass  Discharge Condition: stable and improved. Will discharge home with instructions to follow with PCP in 1 week.  Diet recommendation: heart healthy/low sodium diet  Filed Weights   01/30/15 0441 01/30/15 0900  Weight: 113.399 kg (250 lb) 113.5 kg (250 lb 3.6 oz)    History of present illness:  54 y.o. male with CKD stage V, reported creatinine to be around 7, HTN and GERD. Patient given to the hospital complaining about SOB and fatigue. Patient said for the past 5-6 month he had right foot blood per rectum comes every now and then when he had bowel movement, denies any history of hemorrhoids. He reported colonoscopy recently which she was being told it was okay. He denies any hematemesis, denies any melanotic stools. For the past several weeks he started to get short of breath, early fatigability and sometimes chest pain on exertion. Patient brought to the hospital for further evaluation. In the ED was found to have hemoglobin of 5.2, last hemoglobin was 9.2 in August 2015 and creatinine of 8.9.  Hospital Course:  Symptomatic anemia (appears to be anemia of chronic kidney disease with superimposed GI bleed) -Patient has baseline anemia secondary to his CKD with hemoglobin baseline around 9.2 from August 2015. -Patient  presented with hemoglobin of 5.2. He is a status post 4 units of PRBCs and current hemoglobin level is 8.3 at discharge and 48 hours from last transfusion  -discharge on niferex BID -received IV iron as per renal recommendations as well -will need ESA therapy to be initiated as per renal discretion.  GI bleed -BRBPR for the past 5-6 months, endoscopy in October 2014 showing internal hemorrhoid with polyps. -After 4 units of PRBCs patient hemoglobin has stabilized and remained in 8.0-8.3 range -has not presented melanotic stools or frank bleeding while inpatient -EGD and capsule endoscopy demonstrating just mild gastritis and duodenitis without any major source of bleeding identified -colonoscopy also negative (except for non active bleeding internal hemorrhoid and diverticula) -will discharge on PPI -CBC as an outpatient to follow Hgb trend -PRN anusol  CKD stage V -Nephrology actively following patient  -will need close follow up for right nephrectomy and initiation of HD -will need iron and ESA therapy for CKD anemia  Essential hypertension -Blood pressure sub-optimal controlled -Will discharge on labetalol 300 mg BID; continue norvasc and adjust hydralazine to 25 mg TID -patient advise to follow low sodium heart healthy diet -follow up with PCP for further medication adjustment  Procedures: Endoscopy 2/23 (no active bleeding; mild gastritis appreciated) capsule endoscopy 12/23 (gastritis and duodenitis; but not active bleeding) Colonoscopy to 2/25 (positive for mild diverticula and .  Right renal Mass: with concerns for malignancy -unable to obtain tissue biopsy due to location -will need right nephrectomy -most likely HD to follow surgery -will follow closely with renal service at discharge (Dr.  Justin Mend)  Consultations:  GI  Renal service  Discharge Exam: Filed Vitals:   02/02/15 1345  BP: 153/96  Pulse: 90  Temp: 98.4 F (36.9 C)  Resp: 18   Gen.: Awake Alert,  Oriented X 3, No new F.N deficits, Normal affect Lungs:Good air movement bilaterally, CTAB Heart: RRR,No Gallops,Rubs; positive systolic murmur Abdomen: + Bowel Sounds, Abd Soft, No tenderness, No organomegaly appriciated, No rebound - guarding or rigidity. Lower extremities: No Cyanosis, Clubbing or edema,  Skin: No new Rash or bruise   Discharge Instructions   Discharge Instructions    Diet - low sodium heart healthy    Complete by:  As directed      Discharge instructions    Complete by:  As directed   Maintain good hydration If iron lead to constipation, ok to use over the counter miralax to help as laxative Arrange follow up in 1 week with Dr. Justin Mend for hospital follow up Take medications as prescribed Follow a low sodium heart healthy diet          Current Discharge Medication List    START taking these medications   Details  hydrocortisone (ANUSOL-HC) 25 MG suppository Place 1 suppository (25 mg total) rectally 2 (two) times daily as needed for hemorrhoids or itching (bright red blood per rectum). Qty: 12 suppository, Refills: 0    iron polysaccharides (NIFEREX) 150 MG capsule Take 1 capsule (150 mg total) by mouth 2 (two) times daily. Qty: 60 capsule, Refills: 1    labetalol (NORMODYNE) 300 MG tablet Take 1 tablet (300 mg total) by mouth 2 (two) times daily. Qty: 60 tablet, Refills: 1      CONTINUE these medications which have CHANGED   Details  hydrALAZINE (APRESOLINE) 25 MG tablet Take 1 tablet (25 mg total) by mouth 3 (three) times daily. Qty: 90 tablet, Refills: 0      CONTINUE these medications which have NOT CHANGED   Details  amLODipine (NORVASC) 10 MG tablet Take 10 mg by mouth daily.    calcitRIOL (ROCALTROL) 0.25 MCG capsule Take 0.25 mcg by mouth every Monday, Wednesday, and Friday.     esomeprazole (NEXIUM) 40 MG capsule Take 40 mg by mouth daily as needed (for acid reflux).     Fexofenadine HCl (MUCINEX ALLERGY PO) Take 2 tablets by mouth 4  (four) times daily as needed (congestion).    !! multivitamin (RENA-VIT) TABS tablet Take 1 tablet by mouth daily.    !! B Complex-C-Folic Acid (VOL-CARE RX) 1 MG TABS Take 1 tablet by mouth daily.     oxyCODONE (ROXICODONE) 5 MG immediate release tablet Take 1 tablet (5 mg total) by mouth every 4 (four) hours as needed for severe pain. Qty: 30 tablet, Refills: 0    VIAGRA 50 MG tablet      !! - Potential duplicate medications found. Please discuss with provider.     Allergies  Allergen Reactions  . Ibuprofen Hives  . Lisinopril Swelling    PT states he is allergic to all prils; caused facial swelling  . Naproxen Hives and Other (See Comments)    Alleve causes patient to have hives   Follow-up Information    Follow up with Sherril Croon, MD. Schedule an appointment as soon as possible for a visit in 1 week.   Specialty:  Nephrology   Contact information:   [place] [phone]       The results of significant diagnostics from this hospitalization (including imaging, microbiology,  ancillary and laboratory) are listed below for reference.    Significant Diagnostic Studies: Dg Chest 2 View (if Patient Has Fever And/or Copd)  01/30/2015   CLINICAL DATA:  Shortness of breath. Cough and congestion. Weakness. Symptoms for 4-5 days.  EXAM: CHEST  2 VIEW  COMPARISON:  01/03/2014  FINDINGS: The heart is at the upper limits of normal in size. Pulmonary vasculature is normal. There is no consolidation, pleural effusion, or pneumothorax. No acute osseous abnormalities are seen.  IMPRESSION: Heart at the upper limits of normal in size. No acute pulmonary process.   Electronically Signed   By: Jeb Levering M.D.   On: 01/30/2015 05:40   US Renal  02/01/2015   CLINICAL DATA:  History of right renal complex cyst.  EXAM: RENAL/URINARY TRACT ULTRASOUND COMPLETE  COMPARISON:  Abdomen ultrasound, 05/24/2005  FINDINGS: Right Kidney:  Length: 9.9 cm. Diffuse increased  parenchymal echogenicity. Hypoechoic lesion with some internal echoes projects from the upper pole of the right kidney. It measures 2.3 cm x 1.9 cm x 3.0 cm. 7 mm hypoechoic anechoic lesion in the midpole the right kidney and 7 mm hypoechoic anechoic lesion in the lower pole of the right kidney. No stones. No hydronephrosis.  Left Kidney:  Length: 10.7 cm. Diffuse increased parenchymal echogenicity. 13 mm cyst along the peripheral cortex of the midpole. No solid mass. No hydronephrosis.  17 mm hypoechoic area noted in the inferior margin of the right lobe of the liver.  Bladder:  Appears normal for degree of bladder distention.  IMPRESSION: 1. No acute findings.  No hydronephrosis. 2. Increased renal parenchymal echogenicity consistent with medical renal disease. 3. 3 cm cystic lesion in the upper pole of the right kidney. This is not a simple cyst. Further evaluation with pre and post contrast MRI or CT should be considered. MRI is preferred in younger patients (due to lack of ionizing radiation) and for evaluating calcified lesion(s). 4. Two other small hypo to anechoic lesions in the right kidney are likely simple cyst. 13 mm simple cyst arises from the left kidney. 5. 17 mm hypoechoic liver lesion from the inferior right lobe. This also could be further assessed with MRI.   Electronically Signed   By: Lajean Manes M.D.   On: 02/01/2015 15:10    Labs: Basic Metabolic Panel:  Recent Labs Lab 01/30/15 0452 01/31/15 0514 02/01/15 0539 02/02/15 0340  NA 136 139 137 139  K 4.3 4.5 4.4 4.4  CL 114* 115* 115* 115*  CO2 16* 18* 15* 16*  GLUCOSE 93 88 82 88  BUN 49* 41* 42* 38*  CREATININE 8.98* 8.25* 8.16* 8.18*  CALCIUM 8.4 8.8 8.4 8.9   CBC:  Recent Labs Lab 01/31/15 0514 01/31/15 2128 02/01/15 0539 02/01/15 1708 02/02/15 0340  WBC 8.6 9.5 8.4 8.8 9.0  HGB 6.6* 8.9* 8.7* 8.3* 8.3*  HCT 21.5* 28.2* 27.6* 25.9* 26.5*  MCV 78.2 80.1 80.0 79.7 80.1  PLT 330 351 334 307 325     Signed:  [name]  [facility] 02/02/2015, 4:00 PM

## 2015-02-02 NOTE — Interval H&P Note (Signed)
History and Physical Interval Note:  02/02/2015 9:07 AM  Travis [name]  has presented today for surgery, with the diagnosis of iron deficincy anemia  The various methods of treatment have been discussed with the patient and family. After consideration of risks, benefits and other options for treatment, the patient has consented to  Procedure(s): COLONOSCOPY (N/A) as a surgical intervention .  The patient's history has been reviewed, patient examined, no change in status, stable for surgery.  I have reviewed the patient's chart and labs.  Questions were answered to the patient's satisfaction.     Travis [name]  Assessment:  1.  Iron deficiency anemia.  Negative endoscopy and capsule endoscopy.  October 2014 colonoscopy showed hemorrhoids and small polyps.  Plan:  1.  Repeat colonoscopy. 2.  Risks (bleeding, infection, bowel perforation that could require surgery, sedation-related changes in cardiopulmonary systems), benefits (identification and possible treatment of source of symptoms, exclusion of certain causes of symptoms), and alternatives (watchful waiting, radiographic imaging studies, empiric medical treatment) of colonoscopy were explained to patient/family in detail and patient wishes to proceed.

## 2015-02-03 ENCOUNTER — Encounter (HOSPITAL_COMMUNITY): Payer: Self-pay | Admitting: Gastroenterology

## 2015-02-13 ENCOUNTER — Other Ambulatory Visit (HOSPITAL_COMMUNITY): Payer: Self-pay | Admitting: *Deleted

## 2015-02-14 ENCOUNTER — Encounter (HOSPITAL_COMMUNITY)
Admission: RE | Admit: 2015-02-14 | Discharge: 2015-02-14 | Disposition: A | Discharge status: Home / Self Care | Payer: BLUE CROSS/BLUE SHIELD | Source: Ambulatory Visit | Attending: Nephrology | Admitting: Nephrology

## 2015-02-14 DIAGNOSIS — N185 Chronic kidney disease, stage 5: Secondary | ICD-10-CM | POA: Insufficient documentation

## 2015-02-14 DIAGNOSIS — D631 Anemia in chronic kidney disease: Secondary | ICD-10-CM | POA: Insufficient documentation

## 2015-02-14 LAB — POCT HEMOGLOBIN-HEMACUE: HEMOGLOBIN: 9 g/dL — AB (ref 13.0–17.0)

## 2015-02-14 MED ORDER — DARBEPOETIN ALFA 40 MCG/0.4ML IJ SOSY
40.0000 ug | PREFILLED_SYRINGE | INTRAMUSCULAR | Status: DC
  Administered 2015-02-14: 40 ug via SUBCUTANEOUS

## 2015-02-15 MED ORDER — DARBEPOETIN ALFA 40 MCG/0.4ML IJ SOSY
PREFILLED_SYRINGE | INTRAMUSCULAR | Status: AC
  Filled 2015-02-15: qty 0.4

## 2015-02-20 ENCOUNTER — Encounter (HOSPITAL_COMMUNITY): Payer: BLUE CROSS/BLUE SHIELD

## 2015-03-15 ENCOUNTER — Encounter (HOSPITAL_COMMUNITY)
Admission: RE | Admit: 2015-03-15 | Discharge: 2015-03-15 | Disposition: A | Discharge status: Home / Self Care | Payer: BLUE CROSS/BLUE SHIELD | Source: Ambulatory Visit | Attending: Nephrology | Admitting: Nephrology

## 2015-03-15 DIAGNOSIS — D631 Anemia in chronic kidney disease: Secondary | ICD-10-CM | POA: Insufficient documentation

## 2015-03-15 DIAGNOSIS — N185 Chronic kidney disease, stage 5: Secondary | ICD-10-CM | POA: Insufficient documentation

## 2015-03-15 LAB — POCT HEMOGLOBIN-HEMACUE: HEMOGLOBIN: 8 g/dL — AB (ref 13.0–17.0)

## 2015-03-15 MED ORDER — DARBEPOETIN ALFA 40 MCG/0.4ML IJ SOSY
40.0000 ug | PREFILLED_SYRINGE | INTRAMUSCULAR | Status: DC
  Administered 2015-03-15: 40 ug via SUBCUTANEOUS

## 2015-03-15 MED ORDER — DARBEPOETIN ALFA 40 MCG/0.4ML IJ SOSY
PREFILLED_SYRINGE | INTRAMUSCULAR | Status: AC
  Filled 2015-03-15: qty 0.4

## 2015-03-22 ENCOUNTER — Other Ambulatory Visit (HOSPITAL_COMMUNITY): Payer: Self-pay | Admitting: *Deleted

## 2015-03-23 ENCOUNTER — Inpatient Hospital Stay (HOSPITAL_COMMUNITY)
Admission: RE | Admit: 2015-03-23 | Discharge: 2015-03-23 | Disposition: A | Discharge status: Home / Self Care | Payer: BLUE CROSS/BLUE SHIELD | Source: Ambulatory Visit | Attending: Nephrology | Admitting: Nephrology

## 2015-03-23 NOTE — Progress Notes (Signed)
Pt had an appointment today at 2:00 in medical daycare for IV Iron and injection.  The patient arrived at 3:30 wanting to be seen and we told him he had to get iron today and was an hour and a half late for his appointment and would have to be rescheduled.  The patient stated that no one called him to tell him about the IV iron and laverne the secretary, as well as Anette Guarneri, RN explained to him that it is the office that calls, and not [name], but we were sorry and it was now too late in the day for him to be seen, and we were in the middle of an emergent situation with another patient as well.  The patient stated he would be out of town next week, got upset, walked out and slammed the door.  I called Gustavus Bryant, [place] at [place] [facility] to make her aware.

## 2015-04-06 ENCOUNTER — Encounter (HOSPITAL_COMMUNITY)
Admission: RE | Admit: 2015-04-06 | Discharge: 2015-04-06 | Disposition: A | Discharge status: Home / Self Care | Payer: BLUE CROSS/BLUE SHIELD | Source: Ambulatory Visit | Attending: Nephrology | Admitting: Nephrology

## 2015-04-06 DIAGNOSIS — D631 Anemia in chronic kidney disease: Secondary | ICD-10-CM | POA: Diagnosis not present

## 2015-04-06 LAB — POCT HEMOGLOBIN-HEMACUE: HEMOGLOBIN: 7.1 g/dL — AB (ref 13.0–17.0)

## 2015-04-06 MED ORDER — SODIUM CHLORIDE 0.9 % IV SOLN
510.0000 mg | INTRAVENOUS | Status: DC
  Administered 2015-04-06: 510 mg via INTRAVENOUS
  Filled 2015-04-06: qty 17

## 2015-04-06 MED ORDER — DARBEPOETIN ALFA 60 MCG/0.3ML IJ SOSY
60.0000 ug | PREFILLED_SYRINGE | Freq: Once | INTRAMUSCULAR | Status: AC
  Administered 2015-04-06: 60 ug via SUBCUTANEOUS

## 2015-04-06 MED ORDER — DARBEPOETIN ALFA 60 MCG/0.3ML IJ SOSY
PREFILLED_SYRINGE | INTRAMUSCULAR | Status: AC
  Filled 2015-04-06: qty 0.3

## 2015-04-06 MED ORDER — DARBEPOETIN ALFA 40 MCG/0.4ML IJ SOSY: 40.0000 ug | PREFILLED_SYRINGE | INTRAMUSCULAR | Status: DC

## 2015-04-06 NOTE — Progress Notes (Signed)
Called and reported hemocue result today of 7.1 to [place] at [facility]  New orders received.

## 2015-04-07 LAB — IRON AND TIBC
Iron: 29 ug/dL — ABNORMAL LOW (ref 42–165)
Saturation Ratios: 9 % — ABNORMAL LOW (ref 20–55)
TIBC: 335 ug/dL (ref 215–435)
UIBC: 306 ug/dL (ref 125–400)

## 2015-04-07 LAB — FERRITIN: Ferritin: 5 ng/mL — ABNORMAL LOW (ref 22–322)

## 2015-04-13 ENCOUNTER — Encounter (HOSPITAL_COMMUNITY)
Admission: RE | Admit: 2015-04-13 | Discharge: 2015-04-13 | Disposition: A | Discharge status: Home / Self Care | Payer: BLUE CROSS/BLUE SHIELD | Source: Ambulatory Visit | Attending: Nephrology | Admitting: Nephrology

## 2015-04-13 DIAGNOSIS — D631 Anemia in chronic kidney disease: Secondary | ICD-10-CM | POA: Diagnosis not present

## 2015-04-13 DIAGNOSIS — N185 Chronic kidney disease, stage 5: Secondary | ICD-10-CM | POA: Diagnosis not present

## 2015-04-13 LAB — POCT HEMOGLOBIN-HEMACUE: Hemoglobin: 7.5 g/dL — ABNORMAL LOW (ref 13.0–17.0)

## 2015-04-13 MED ORDER — DARBEPOETIN ALFA 60 MCG/0.3ML IJ SOSY
60.0000 ug | PREFILLED_SYRINGE | INTRAMUSCULAR | Status: DC
  Administered 2015-04-13: 60 ug via SUBCUTANEOUS

## 2015-04-13 MED ORDER — DARBEPOETIN ALFA 60 MCG/0.3ML IJ SOSY
PREFILLED_SYRINGE | INTRAMUSCULAR | Status: AC
  Administered 2015-04-13: 60 ug via SUBCUTANEOUS
  Filled 2015-04-13: qty 0.3

## 2015-04-13 MED ORDER — SODIUM CHLORIDE 0.9 % IV SOLN
510.0000 mg | INTRAVENOUS | Status: DC
  Administered 2015-04-13: 510 mg via INTRAVENOUS
  Filled 2015-04-13: qty 17

## 2015-04-20 ENCOUNTER — Encounter (HOSPITAL_COMMUNITY): Payer: BLUE CROSS/BLUE SHIELD

## 2015-05-10 HISTORY — PX: NEPHRECTOMY RADICAL: SUR878

## 2017-12-22 ENCOUNTER — Encounter (HOSPITAL_COMMUNITY)
Admission: EM | Disposition: A | Discharge status: Home / Self Care | Payer: Self-pay | Source: Home / Self Care | Attending: Emergency Medicine

## 2017-12-22 ENCOUNTER — Ambulatory Visit (HOSPITAL_COMMUNITY)
Admission: EM | Admit: 2017-12-22 | Discharge: 2017-12-22 | Disposition: A | Discharge status: Home / Self Care | Payer: Managed Care, Other (non HMO) | Attending: Emergency Medicine | Admitting: Emergency Medicine

## 2017-12-22 ENCOUNTER — Encounter (HOSPITAL_COMMUNITY): Payer: Self-pay

## 2017-12-22 ENCOUNTER — Emergency Department (HOSPITAL_COMMUNITY): Payer: Managed Care, Other (non HMO) | Admitting: Certified Registered Nurse Anesthetist

## 2017-12-22 ENCOUNTER — Other Ambulatory Visit: Payer: Self-pay

## 2017-12-22 DIAGNOSIS — N186 End stage renal disease: Secondary | ICD-10-CM | POA: Insufficient documentation

## 2017-12-22 DIAGNOSIS — Y832 Surgical operation with anastomosis, bypass or graft as the cause of abnormal reaction of the patient, or of later complication, without mention of misadventure at the time of the procedure: Secondary | ICD-10-CM | POA: Diagnosis not present

## 2017-12-22 DIAGNOSIS — Z992 Dependence on renal dialysis: Secondary | ICD-10-CM | POA: Diagnosis not present

## 2017-12-22 DIAGNOSIS — Z87891 Personal history of nicotine dependence: Secondary | ICD-10-CM | POA: Diagnosis not present

## 2017-12-22 DIAGNOSIS — T82898A Other specified complication of vascular prosthetic devices, implants and grafts, initial encounter: Secondary | ICD-10-CM

## 2017-12-22 DIAGNOSIS — D500 Iron deficiency anemia secondary to blood loss (chronic): Secondary | ICD-10-CM | POA: Diagnosis not present

## 2017-12-22 DIAGNOSIS — Z79899 Other long term (current) drug therapy: Secondary | ICD-10-CM | POA: Insufficient documentation

## 2017-12-22 DIAGNOSIS — G4730 Sleep apnea, unspecified: Secondary | ICD-10-CM | POA: Insufficient documentation

## 2017-12-22 DIAGNOSIS — Z9989 Dependence on other enabling machines and devices: Secondary | ICD-10-CM | POA: Diagnosis not present

## 2017-12-22 DIAGNOSIS — I120 Hypertensive chronic kidney disease with stage 5 chronic kidney disease or end stage renal disease: Secondary | ICD-10-CM | POA: Insufficient documentation

## 2017-12-22 DIAGNOSIS — K219 Gastro-esophageal reflux disease without esophagitis: Secondary | ICD-10-CM | POA: Insufficient documentation

## 2017-12-22 HISTORY — PX: AV FISTULA PLACEMENT: SHX1204

## 2017-12-22 LAB — I-STAT CHEM 8, ED
BUN: 16 mg/dL (ref 6–20)
Calcium, Ion: 1.01 mmol/L — ABNORMAL LOW (ref 1.15–1.40)
Chloride: 94 mmol/L — ABNORMAL LOW (ref 101–111)
Creatinine, Ser: 8.4 mg/dL — ABNORMAL HIGH (ref 0.61–1.24)
Glucose, Bld: 80 mg/dL (ref 65–99)
HEMATOCRIT: 33 % — AB (ref 39.0–52.0)
HEMOGLOBIN: 11.2 g/dL — AB (ref 13.0–17.0)
POTASSIUM: 3.9 mmol/L (ref 3.5–5.1)
SODIUM: 140 mmol/L (ref 135–145)
TCO2: 33 mmol/L — ABNORMAL HIGH (ref 22–32)

## 2017-12-22 SURGERY — ARTERIOVENOUS (AV) FISTULA CREATION
Anesthesia: General | Site: Arm Upper | Laterality: Left

## 2017-12-22 MED ORDER — PROPOFOL 10 MG/ML IV BOLUS
INTRAVENOUS | Status: DC | PRN
  Administered 2017-12-22: 150 mg via INTRAVENOUS

## 2017-12-22 MED ORDER — FENTANYL CITRATE (PF) 100 MCG/2ML IJ SOLN
INTRAMUSCULAR | Status: DC | PRN
  Administered 2017-12-22: 50 ug via INTRAVENOUS
  Administered 2017-12-22: 100 ug via INTRAVENOUS
  Administered 2017-12-22: 50 ug via INTRAVENOUS

## 2017-12-22 MED ORDER — SUCCINYLCHOLINE CHLORIDE 200 MG/10ML IV SOSY
PREFILLED_SYRINGE | INTRAVENOUS | Status: DC | PRN
  Administered 2017-12-22: 140 mg via INTRAVENOUS

## 2017-12-22 MED ORDER — MEPERIDINE HCL 25 MG/ML IJ SOLN
6.2500 mg | INTRAMUSCULAR | Status: DC | PRN
Start: 2017-12-22 — End: 2017-12-22

## 2017-12-22 MED ORDER — PROTAMINE SULFATE 10 MG/ML IV SOLN
INTRAVENOUS | Status: DC | PRN
  Administered 2017-12-22: 20 mg via INTRAVENOUS
  Administered 2017-12-22: 10 mg via INTRAVENOUS
  Administered 2017-12-22: 20 mg via INTRAVENOUS

## 2017-12-22 MED ORDER — SODIUM CHLORIDE 0.9 % IV SOLN
INTRAVENOUS | Status: DC | PRN
  Administered 2017-12-22: 500 mL

## 2017-12-22 MED ORDER — PROPOFOL 10 MG/ML IV BOLUS
INTRAVENOUS | Status: AC
  Filled 2017-12-22: qty 40

## 2017-12-22 MED ORDER — MIDAZOLAM HCL 2 MG/2ML IJ SOLN
INTRAMUSCULAR | Status: AC
  Filled 2017-12-22: qty 2

## 2017-12-22 MED ORDER — TRAMADOL HCL 50 MG PO TABS: 50.0000 mg | ORAL_TABLET | Freq: Four times a day (QID) | ORAL | 0 refills | Status: AC | PRN

## 2017-12-22 MED ORDER — SODIUM CHLORIDE 0.9 % IV SOLN
INTRAVENOUS | Status: DC
  Administered 2017-12-22 (×2): via INTRAVENOUS

## 2017-12-22 MED ORDER — FENTANYL CITRATE (PF) 250 MCG/5ML IJ SOLN
INTRAMUSCULAR | Status: AC
  Filled 2017-12-22: qty 5

## 2017-12-22 MED ORDER — MIDAZOLAM HCL 2 MG/2ML IJ SOLN
INTRAMUSCULAR | Status: DC | PRN
  Administered 2017-12-22: 2 mg via INTRAVENOUS

## 2017-12-22 MED ORDER — LIDOCAINE-EPINEPHRINE 0.5 %-1:200000 IJ SOLN
INTRAMUSCULAR | Status: AC
  Filled 2017-12-22: qty 1

## 2017-12-22 MED ORDER — LIDOCAINE 2% (20 MG/ML) 5 ML SYRINGE
INTRAMUSCULAR | Status: DC | PRN
  Administered 2017-12-22: 100 mg via INTRAVENOUS

## 2017-12-22 MED ORDER — HYDROMORPHONE HCL 1 MG/ML IJ SOLN
0.2500 mg | INTRAMUSCULAR | Status: DC | PRN
Start: 2017-12-22 — End: 2017-12-22
  Administered 2017-12-22 (×2): 0.5 mg via INTRAVENOUS

## 2017-12-22 MED ORDER — HEPARIN SODIUM (PORCINE) 1000 UNIT/ML IJ SOLN
INTRAMUSCULAR | Status: AC
  Filled 2017-12-22: qty 1

## 2017-12-22 MED ORDER — HEPARIN SODIUM (PORCINE) 1000 UNIT/ML IJ SOLN
INTRAMUSCULAR | Status: DC | PRN
  Administered 2017-12-22: 6000 [IU] via INTRAVENOUS

## 2017-12-22 MED ORDER — HYDROMORPHONE HCL 1 MG/ML IJ SOLN
INTRAMUSCULAR | Status: AC
  Filled 2017-12-22: qty 1

## 2017-12-22 MED ORDER — 0.9 % SODIUM CHLORIDE (POUR BTL) OPTIME
TOPICAL | Status: DC | PRN
  Administered 2017-12-22: 1000 mL

## 2017-12-22 MED ORDER — CEFAZOLIN SODIUM-DEXTROSE 2-4 GM/100ML-% IV SOLN
2.0000 g | Freq: Once | INTRAVENOUS | Status: AC
  Administered 2017-12-22: 2 g via INTRAVENOUS

## 2017-12-22 MED ORDER — PROTAMINE SULFATE 10 MG/ML IV SOLN
INTRAVENOUS | Status: AC
  Filled 2017-12-22: qty 5

## 2017-12-22 MED ORDER — ONDANSETRON HCL 4 MG/2ML IJ SOLN: 4.0000 mg | Freq: Once | INTRAMUSCULAR | Status: DC | PRN

## 2017-12-22 MED ORDER — CEFAZOLIN SODIUM-DEXTROSE 2-4 GM/100ML-% IV SOLN
INTRAVENOUS | Status: AC
  Filled 2017-12-22: qty 100

## 2017-12-22 MED ORDER — ONDANSETRON HCL 4 MG/2ML IJ SOLN
INTRAMUSCULAR | Status: AC
  Filled 2017-12-22: qty 2

## 2017-12-22 MED ORDER — ONDANSETRON HCL 4 MG/2ML IJ SOLN
INTRAMUSCULAR | Status: DC | PRN
  Administered 2017-12-22: 4 mg via INTRAVENOUS

## 2017-12-22 SURGICAL SUPPLY — 35 items
ADH SKN CLS APL DERMABOND .7 (GAUZE/BANDAGES/DRESSINGS) ×1
ARMBAND PINK RESTRICT EXTREMIT (MISCELLANEOUS) ×4 IMPLANT
BANDAGE ESMARK 6X9 LF (GAUZE/BANDAGES/DRESSINGS) IMPLANT
BNDG CMPR 9X4 STRL LF SNTH (GAUZE/BANDAGES/DRESSINGS)
BNDG CMPR 9X6 STRL LF SNTH (GAUZE/BANDAGES/DRESSINGS) ×1
BNDG ESMARK 4X9 LF (GAUZE/BANDAGES/DRESSINGS) IMPLANT
BNDG ESMARK 6X9 LF (GAUZE/BANDAGES/DRESSINGS) ×2
CANISTER SUCT 3000ML PPV (MISCELLANEOUS) ×2 IMPLANT
CANNULA VESSEL 3MM 2 BLNT TIP (CANNULA) ×2 IMPLANT
CLIP LIGATING EXTRA MED SLVR (CLIP) ×2 IMPLANT
CLIP LIGATING EXTRA SM BLUE (MISCELLANEOUS) ×2 IMPLANT
COVER PROBE W GEL 5X96 (DRAPES) ×2 IMPLANT
CUFF TOURNIQUET SINGLE 18IN (TOURNIQUET CUFF) ×1 IMPLANT
DECANTER SPIKE VIAL GLASS SM (MISCELLANEOUS) ×2 IMPLANT
DERMABOND ADVANCED (GAUZE/BANDAGES/DRESSINGS) ×1
DERMABOND ADVANCED .7 DNX12 (GAUZE/BANDAGES/DRESSINGS) ×1 IMPLANT
ELECT REM PT RETURN 9FT ADLT (ELECTROSURGICAL) ×2
ELECTRODE REM PT RTRN 9FT ADLT (ELECTROSURGICAL) ×1 IMPLANT
GLOVE BIOGEL PI IND STRL 6.5 (GLOVE) IMPLANT
GLOVE BIOGEL PI INDICATOR 6.5 (GLOVE) ×2
GLOVE SS BIOGEL STRL SZ 7.5 (GLOVE) ×1 IMPLANT
GLOVE SUPERSENSE BIOGEL SZ 7.5 (GLOVE) ×1
GOWN STRL REUS W/ TWL LRG LVL3 (GOWN DISPOSABLE) ×3 IMPLANT
GOWN STRL REUS W/TWL LRG LVL3 (GOWN DISPOSABLE) ×8
KIT BASIN OR (CUSTOM PROCEDURE TRAY) ×2 IMPLANT
KIT ROOM TURNOVER OR (KITS) ×2 IMPLANT
NS IRRIG 1000ML POUR BTL (IV SOLUTION) ×2 IMPLANT
PACK CV ACCESS (CUSTOM PROCEDURE TRAY) ×2 IMPLANT
PAD ARMBOARD 7.5X6 YLW CONV (MISCELLANEOUS) ×4 IMPLANT
SUT PROLENE 6 0 CC (SUTURE) ×3 IMPLANT
SUT VIC AB 3-0 SH 27 (SUTURE) ×2
SUT VIC AB 3-0 SH 27X BRD (SUTURE) ×1 IMPLANT
TOWEL GREEN STERILE (TOWEL DISPOSABLE) ×2 IMPLANT
UNDERPAD 30X30 (UNDERPADS AND DIAPERS) ×2 IMPLANT
WATER STERILE IRR 1000ML POUR (IV SOLUTION) ×2 IMPLANT

## 2017-12-22 NOTE — Op Note (Signed)
    OPERATIVE REPORT  DATE OF SURGERY: 12/22/2017  PATIENT: Travis [name], 57 y.o. 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  PHYSICIAN ASSISTANT: Dagoberto Ligas PA-C  ANESTHESIA: General  EBL: Minimal ml  Total I/O In: 300 [I.V.:300] Out: -   BLOOD ADMINISTERED: None  DRAINS: None  SPECIMEN: None  COUNTS CORRECT:  YES  PLAN OF CARE: PACU  PATIENT DISPOSITION:  PACU - hemodynamically stable  PROCEDURE DETAILS: Patient has a left upper arm basilic vein transposition fistula that was placed in 2015.  He had been having good use of this and had puncture of a different location near the arteriovenous and week.  He noted acute swelling in his left arm and this is been increasing in size since that time and is now painful.  He was felt to have a pulsatile mass with obvious false emergency department for further evaluation.  I recommended emergent evaluation and repair  The patient left arm was prepped and draped usual sterile fashion.  A 18 pneumatic tourniquet was positioned high on the upper arm.  The patient was given 6000 neutrophils of venous heparin and after adequate circulation time the arm was exsanguinated with an Esmarch tourniquet and the pneumatic tourniquet was inflated.  Incision was made over the old arteriovenous anastomosis at the antecubital space and the false aneurysm was entered.  On further exploration it appeared to be a laceration in the vein near the brachial artery anastomosis.  This was closed with interrupted 6-0 Prolene sutures.  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  Hemostasis was obtained electrocautery.  The wound was closed with 3-0 Vicryl in the subcutaneous tissue and the  skin was closed with 3-0 subcuticular Vicryl suture.  Sterile dressing was applied and the patient was transferred to the recovery room with excellent thrill in his basilic vein fistula.   [name], M.D., [facility] 12/22/2017 3:33 PM

## 2017-12-22 NOTE — Transfer of Care (Signed)
Immediate Anesthesia Transfer of Care Note  Patient: Travis [name]  Procedure(s) Performed: REPAIR PSEUDOANEURYSM ARTERIOVENOUS (AV) FISTULA (Left Arm Upper)  Patient Location: PACU  Anesthesia Type:General  Level of Consciousness: drowsy  Airway & Oxygen Therapy: Patient Spontanous Breathing and Patient connected to nasal cannula oxygen  Post-op Assessment: Report given to RN, Post -op Vital signs reviewed and stable and Patient moving all extremities  Post vital signs: Reviewed and stable  Last Vitals:  Vitals:   12/22/17 1325 12/22/17 1329  BP:    Pulse: 67 71  Resp:  13  Temp:    SpO2: 97% 99%    Last Pain:  Vitals:   12/22/17 1231  TempSrc:   PainSc: 10-Worst pain ever         Complications: No apparent anesthesia complications

## 2017-12-22 NOTE — ED Provider Notes (Addendum)
[facility] EMERGENCY DEPARTMENT Provider Note   CSN: [id number] Arrival date & time: 12/22/17  1213     History   Chief Complaint Chief Complaint  Patient presents with  . Vascular Access Problem    HPI Travis [name] is a 57 y.o. male.  Patient with history of end-stage renal disease on hemodialysis with left upper extremity AV fistula sent to the emergency department today by Dr. Donnetta Hutching.  Patient with false aneurysm of the left AV fistula.  Patient reports symptoms started Wednesday with an acute enlargement and bulging just proximal to the left antecubital area.  Area is painful.  No bleeding.  Patient was able to dialyze today.  He reports no other medical complaints.  No shortness of breath.  Plan is IV, blood work, transfer to the [place].      Past Medical History:  Diagnosis Date  . Anemia 01/30/2015  . Chronic kidney disease   . GERD (gastroesophageal reflux disease)    takes Nexium  . Hypertension   . Pneumonia    as a child  . Sleep apnea    uses c-pap 2 yrs    Patient Active Problem List   Diagnosis Date Noted  . Mass   . Anemia due to chronic blood loss 01/30/2015  . BRBPR (bright red blood per rectum) 01/30/2015  . Essential hypertension 01/30/2015  . Symptomatic anemia 01/30/2015  . End stage renal disease ([place]) 08/26/2014  . Drainage from wound-Left upper arm 08/11/2014  . CKD (chronic kidney disease), stage V ([place]) 12/31/2013    Past Surgical History:  Procedure Laterality Date  . BASCILIC VEIN TRANSPOSITION Left 01/05/2014   Procedure: LEFT 1ST STAGE BASCILIC VEIN TRANSPOSITION;  Surgeon: Conrad Pillow, MD;  Location: [facility];  Service: Vascular;  Laterality: Left;  . BASCILIC VEIN TRANSPOSITION Left 07/26/2014   Procedure: Left Arm Brachial Vein Transposition Second Stage;  Surgeon: Conrad , MD;  Location: [facility];  Service: Vascular;  Laterality: Left;  . COLONOSCOPY    . COLONOSCOPY N/A 02/02/2015   Procedure: COLONOSCOPY;   Surgeon: [name], MD;  [facility] ENDOSCOPY;  Service: Endoscopy;  Laterality: N/A;  . ESOPHAGOGASTRODUODENOSCOPY Left 01/31/2015   Procedure: ESOPHAGOGASTRODUODENOSCOPY (EGD);  Surgeon: [name], MD;  Location[facility] ENDOSCOPY;  Service: Endoscopy;  Laterality: Left;  . GIVENS CAPSULE STUDY N/A 01/31/2015   Procedure: GIVENS CAPSULE STUDY;  Surgeon: [name], MD;  Location[facility] ENDOSCOPY;  Service: Endoscopy;  Laterality: N/A;  . MOUTH SURGERY     teeth cleaning       Home Medications    Prior to Admission medications   Medication Sig Start Date End Date Taking? Authorizing Provider  amLODipine (NORVASC) 10 MG tablet Take 10 mg by mouth daily.    [provider]  B Complex-C-Folic Acid (VOL-CARE RX) 1 MG TABS Take 1 tablet by mouth daily.  08/02/14   [provider]  calcitRIOL (ROCALTROL) 0.25 MCG capsule Take 0.25 mcg by mouth every Monday, Wednesday, and Friday.     [provider]  esomeprazole (NEXIUM) 40 MG capsule Take 40 mg by mouth daily as needed (for acid reflux).     [provider]  Fexofenadine HCl (MUCINEX ALLERGY PO) Take 2 tablets by mouth 4 (four) times daily as needed (congestion).    [provider]  hydrALAZINE (APRESOLINE) 25 MG tablet Take 1 tablet (25 mg total) by mouth 3 (three) times daily. 02/02/15   [name], MD  hydrocortisone (ANUSOL-HC) 25  MG suppository Place 1 suppository (25 mg total) rectally 2 (two) times daily as needed for hemorrhoids or itching (bright red blood per rectum). 02/02/15   [name], MD  iron polysaccharides (NIFEREX) 150 MG capsule Take 1 capsule (150 mg total) by mouth 2 (two) times daily. 02/02/15   [name], MD  labetalol (NORMODYNE) 300 MG tablet Take 1 tablet (300 mg total) by mouth 2 (two) times daily. 02/02/15   [name], MD  multivitamin (RENA-VIT) TABS tablet Take 1 tablet by mouth daily.    [provider]  oxyCODONE (ROXICODONE) 5 MG  immediate release tablet Take 1 tablet (5 mg total) by mouth every 4 (four) hours as needed for severe pain. Patient not taking: Reported on 01/30/2015 07/26/14   Conrad [place], MD  VIAGRA 50 MG tablet  08/20/14   [provider]    Family History Family History  Problem Relation Age of Onset  . Hypertension Mother   . Hypertension Father   . Hypertension Sister     Social History Social History   Tobacco Use  . Smoking status: Former Smoker    Years: 4.00    Types: Cigars    Last attempt to quit: 12/31/2012    Years since quitting: 4.9  . Smokeless tobacco: Never Used  Substance Use Topics  . Alcohol use: No  . Drug use: No     Allergies   Ibuprofen; Lisinopril; and Naproxen   Review of Systems Review of Systems  Constitutional: Negative for fever.  Musculoskeletal: Positive for myalgias.  Skin:       Swelling of left upper extremity AV graft     Physical Exam Updated Vital Signs BP (!) 168/107 (BP Location: Right Arm)   Pulse 69   Temp 98.3 F (36.8 C) (Oral)   Resp 18   SpO2 96%   Physical Exam  Constitutional: He appears well-developed and well-nourished.  HENT:  Head: Normocephalic and atraumatic.  Eyes: Conjunctivae are normal.  Neck: Normal range of motion. Neck supple.  Pulmonary/Chest: No respiratory distress.  Neurological: He is alert.  Skin: Skin is warm and dry.  Pulsatile mass involving the distal aspect of the AV fistula in the left upper extremity.  No active bleeding.  2+ radial pulses distally.  Normal capillary refill.  Normal sensation distally.  Psychiatric: He has a normal mood and affect.  Nursing note and vitals reviewed.    ED Treatments / Results  Labs (all labs ordered are listed, but only abnormal results are displayed) Labs Reviewed - No data to display  EKG  EKG Interpretation  Date/Time:  Monday December 22 2017 13:27:44 EST Ventricular Rate:  66 PR Interval:    QRS Duration: 103 QT Interval:  458 QTC  Calculation: 480 R Axis:   48 Text Interpretation:  Sinus rhythm Borderline low voltage, extremity leads Borderline prolonged QT interval Baseline wander No significant change since last tracing Abnormal ekg Confirmed by Carmin Muskrat [phone]) on 12/23/2017 8:53:24 PM       Radiology No results found.  Procedures Procedures (including critical care time)  Medications Ordered in ED Medications  0.9 %  sodium chloride infusion (not administered)     Initial Impression / Assessment and Plan / ED Course  I have reviewed the triage vital signs and the nursing notes.  Pertinent labs & imaging results that were available during my care of the patient were reviewed by me and considered in my medical decision making (see chart for details).  Patient seen and examined prior to transfer to OR.   Vital signs reviewed and are as follows: BP (!) 168/107 (BP Location: Right Arm)   Pulse 69   Temp 98.3 F (36.8 C) (Oral)   Resp 18   SpO2 96%     Final Clinical Impressions(s) / ED Diagnoses   Final diagnoses:  Hemodialysis AV fistula aneurysm, initial encounter [facility])   Patient staging in ED prior to OR for AV fistula repair. UE is neurovascularly intact.   ED Discharge Orders    None       Carlisle Cater, Hershal Coria 12/22/17 [place], MD 12/22/17 [place], MD 12/24/17 [phone]

## 2017-12-22 NOTE — ED Triage Notes (Signed)
Pt arrives from vascular office with hematoma to LEFT upper arm dialysis fistula to see surgeon. Pt reports Dr. Donnetta Hutching is aware. Last dialysis today and stopped 15 mins early. Thrill/ bruit present.

## 2017-12-22 NOTE — Progress Notes (Signed)
Patient ID: Travis [name], male   DOB: Sep 08, 1961, 57 y.o.   MRN: [id number]                                       Vascular and Vein Specialist of [facility]  Patient name: Travis [name] MRN: [id number] DOB: February 26, 1961 Sex: male    HPI: Travis [name] is a 57 y.o. 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  The technician attempted to access at the antecubital space and the.  He presented today and was found to have a pulsatile mass at his antecubital space.  He has been n.p.o. since yesterday.  Past Medical History:  Diagnosis Date  . Anemia 01/30/2015  . Chronic kidney disease   . GERD (gastroesophageal reflux disease)    takes Nexium  . Hypertension   . Pneumonia    as a child  . Sleep apnea    uses c-pap 2 yrs    Family History  Problem Relation Age of Onset  . Hypertension Mother   . Hypertension Father   . Hypertension Sister     SOCIAL HISTORY: Social History   Tobacco Use  . Smoking status: Former Smoker    Years: 4.00    Types: Cigars    Last attempt to quit: 12/31/2012    Years since quitting: 4.9  . Smokeless tobacco: Never Used  Substance Use Topics  . Alcohol use: No    Allergies  Allergen Reactions  . Ibuprofen Hives  . Lisinopril Swelling    PT states he is allergic to all prils; caused facial swelling  . Naproxen Hives and Other (See Comments)    Alleve causes patient to have hives    No current facility-administered medications for this encounter.    Current Outpatient Medications  Medication Sig Dispense Refill  . amLODipine (NORVASC) 10 MG tablet Take 10 mg by mouth daily.    . B Complex-C-Folic Acid (VOL-CARE RX) 1 MG TABS Take 1 tablet by mouth daily.     . calcitRIOL (ROCALTROL) 0.25 MCG capsule Take 0.25 mcg by mouth every Monday, Wednesday, and Friday.     . esomeprazole (NEXIUM) 40 MG capsule Take 40 mg by mouth daily as needed (for acid reflux).     Marland Kitchen Fexofenadine HCl  (MUCINEX ALLERGY PO) Take 2 tablets by mouth 4 (four) times daily as needed (congestion).    . hydrALAZINE (APRESOLINE) 25 MG tablet Take 1 tablet (25 mg total) by mouth 3 (three) times daily. 90 tablet 0  . hydrocortisone (ANUSOL-HC) 25 MG suppository Place 1 suppository (25 mg total) rectally 2 (two) times daily as needed for hemorrhoids or itching (bright red blood per rectum). 12 suppository 0  . iron polysaccharides (NIFEREX) 150 MG capsule Take 1 capsule (150 mg total) by mouth 2 (two) times daily. 60 capsule 1  . labetalol (NORMODYNE) 300 MG tablet Take 1 tablet (300 mg total) by mouth 2 (two) times daily. 60 tablet 1  . multivitamin (RENA-VIT) TABS tablet Take 1 tablet by mouth daily.    Marland Kitchen oxyCODONE (ROXICODONE) 5 MG immediate release tablet Take 1 tablet (5 mg total) by mouth every 4 (four) hours as needed for severe pain. (Patient not taking: Reported on 01/30/2015) 30 tablet 0  . VIAGRA 50 MG tablet       REVIEW OF SYSTEMS:  [X]  denotes positive  finding, [ ]  denotes negative finding Cardiac  Comments:  Chest pain or chest pressure:    Shortness of breath upon exertion:    Short of breath when lying flat:    Irregular heart rhythm:        Vascular    Pain in calf, thigh, or hip brought on by ambulation:    Pain in feet at night that wakes you up from your sleep:     Blood clot in your veins:    Leg swelling:           PHYSICAL EXAM: Vitals:   12/22/17 1227  BP: (!) 168/107  Pulse: 69  Resp: 18  Temp: 98.3 F (36.8 C)  TempSrc: Oral  SpO2: 96%    GENERAL: The patient is a well-nourished male, in no acute distress. The vital signs are documented above. CARDIOVASCULAR: He does have a thrill in his upper arm AV fistula.  He does have an expansile mass at the antecubital space over his arteriovenous anastomosis PULMONARY: There is good air exchange  MUSCULOSKELETAL: There are no major deformities or cyanosis. NEUROLOGIC: No focal weakness or paresthesias are  detected. SKIN: There are no ulcers or rashes noted. PSYCHIATRIC: The patient has a normal affect.  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  Will take him immediately to the operating room for repair.  Should be able to salvage his access.  Could potentially require catheter if his access is unsalvageable.    [name], MD FACS [facility] of [facility] Tel [phone] Pager [phone]

## 2017-12-22 NOTE — ED Notes (Signed)
Pt reports his access was infiltrated at dialysis on Wednesday. Pt reports receiving full treatment on Wednesday, Friday, and again today with increasing pain at site today. Selling noted to upper left arm.

## 2017-12-22 NOTE — Anesthesia Procedure Notes (Signed)
Procedure Name: Intubation Date/Time: 12/22/2017 2:37 PM Performed by: Leonor Liv, CRNA Pre-anesthesia Checklist: Patient identified, Emergency Drugs available, Suction available and Patient being monitored Patient Re-evaluated:Patient Re-evaluated prior to induction Oxygen Delivery Method: Circle System Utilized Preoxygenation: Pre-oxygenation with 100% oxygen Induction Type: IV induction, Cricoid Pressure applied and Rapid sequence Laryngoscope Size: Mac and 4 Grade View: Grade II Tube type: Oral Tube size: 7.5 mm Number of attempts: 1 Airway Equipment and Method: Stylet and Oral airway Placement Confirmation: ETT inserted through vocal cords under direct vision,  positive ETCO2 and breath sounds checked- equal and bilateral Secured at: 23 cm Tube secured with: Tape Dental Injury: Teeth and Oropharynx as per pre-operative assessment

## 2017-12-22 NOTE — Anesthesia Postprocedure Evaluation (Signed)
Anesthesia Post Note  Patient: Travis [name]  Procedure(s) Performed: REPAIR PSEUDOANEURYSM ARTERIOVENOUS (AV) FISTULA (Left Arm Upper)     Patient location during evaluation: PACU Anesthesia Type: General Level of consciousness: awake and alert Pain management: pain level controlled Vital Signs Assessment: post-procedure vital signs reviewed and stable Respiratory status: spontaneous breathing, nonlabored ventilation, respiratory function stable and patient connected to nasal cannula oxygen Cardiovascular status: blood pressure returned to baseline and stable Postop Assessment: no apparent nausea or vomiting Anesthetic complications: no    Last Vitals:  Vitals:   12/22/17 1634 12/22/17 1635  BP:  (!) 156/100  Pulse: 66 67  Resp: 11 (!) 9  Temp:    SpO2: 96% (!) 86%    Last Pain:  Vitals:   12/22/17 1640  TempSrc:   PainSc: Asleep                 Travis [name] DAVID

## 2017-12-22 NOTE — Anesthesia Preprocedure Evaluation (Signed)
Anesthesia Evaluation  Patient identified by MRN, date of birth, ID band Patient awake    Reviewed: Allergy & Precautions, NPO status , Patient's Chart, lab work & pertinent test results  Airway Mallampati: II  TM Distance: >3 FB Neck ROM: Full    Dental   Pulmonary sleep apnea , former smoker,    Pulmonary exam normal        Cardiovascular hypertension, Pt. on medications Normal cardiovascular exam     Neuro/Psych    GI/Hepatic GERD  Medicated and Controlled,  Endo/Other    Renal/GU Dialysis and ESRFRenal disease     Musculoskeletal   Abdominal   Peds  Hematology   Anesthesia Other Findings   Reproductive/Obstetrics                             Anesthesia Physical Anesthesia Plan  ASA: III and emergent  Anesthesia Plan: General   Post-op Pain Management:    Induction: Intravenous, Rapid sequence and Cricoid pressure planned  PONV Risk Score and Plan: 2 and Ondansetron and Treatment may vary due to age or medical condition  Airway Management Planned: Oral ETT  Additional Equipment:   Intra-op Plan:   Post-operative Plan: Extubation in OR  Informed Consent: I have reviewed the patients History and Physical, chart, labs and discussed the procedure including the risks, benefits and alternatives for the proposed anesthesia with the patient or authorized representative who has indicated his/her understanding and acceptance.     Plan Discussed with: CRNA and Surgeon  Anesthesia Plan Comments:         Anesthesia Quick Evaluation

## 2017-12-23 ENCOUNTER — Encounter: Payer: Self-pay | Admitting: Vascular Surgery

## 2017-12-23 ENCOUNTER — Encounter (HOSPITAL_COMMUNITY): Payer: Self-pay | Admitting: Vascular Surgery

## 2018-02-10 ENCOUNTER — Encounter: Payer: Self-pay | Admitting: Surgery

## 2018-02-10 ENCOUNTER — Ambulatory Visit: Payer: Self-pay | Admitting: Surgery

## 2018-02-24 ENCOUNTER — Encounter (HOSPITAL_COMMUNITY): Payer: Self-pay | Admitting: *Deleted

## 2018-02-24 ENCOUNTER — Other Ambulatory Visit: Payer: Self-pay

## 2018-02-24 NOTE — Progress Notes (Signed)
Travis [name]  02/24/2018   Your procedure is scheduled on: 02/26/2018   Report to [facility] Main  Entrance  Report to admitting at  1230pm        Call this number if you have problems the morning of surgery [phone]   Remember: Do not eat food  After midnite.   Follow Bowel Prep instrutions per MD and Ensure Drink Instructions.      Take these medicines the morning of surgery with A SIP OF WATER: labetalol ( Normodyne) and Amlodipine ( Norvasc) and nexium if needed .                                   You may not have any metal on your body including hair pins and              piercings  Do not wear jewelry, , lotions, powders or perfumes, deodorant            .              Men may shave face and neck.   Do not bring valuables to the hospital. [place].  Contacts, dentures or bridgework may not be worn into surgery.      Patients discharged the day of surgery will not be allowed to drive home.  Name and phone number of your driver: wife                 Please read over the following fact sheets you were given: _____________________________________________________________________             [facility] - Preparing for Surgery Before surgery, you can play an important role.  Because skin is not sterile, your skin needs to be as free of germs as possible.  You can reduce the number of germs on your skin by washing with CHG (chlorahexidine gluconate) soap before surgery.  CHG is an antiseptic cleaner which kills germs and bonds with the skin to continue killing germs even after washing. Please DO NOT use if you have an allergy to CHG or antibacterial soaps.  If your skin becomes reddened/irritated stop using the CHG and inform your nurse when you arrive at Short Stay. Do not shave (including legs and underarms) for at least 48 hours prior to the first CHG shower.  You may shave your  face/neck. Please follow these instructions carefully:  1.  Shower with CHG Soap the night before surgery and the  morning of Surgery.  2.  If you choose to wash your hair, wash your hair first as usual with your  normal  shampoo.  3.  After you shampoo, rinse your hair and body thoroughly to remove the  shampoo.                           4.  Use CHG as you would any other liquid soap.  You can apply chg directly  to the skin and wash                       Gently with a scrungie or clean washcloth.  5.  Apply the CHG  Soap to your body ONLY FROM THE NECK DOWN.   Do not use on face/ open                           Wound or open sores. Avoid contact with eyes, ears mouth and genitals (private parts).                       Wash face,  Genitals (private parts) with your normal soap.             6.  Wash thoroughly, paying special attention to the area where your surgery  will be performed.  7.  Thoroughly rinse your body with warm water from the neck down.  8.  DO NOT shower/wash with your normal soap after using and rinsing off  the CHG Soap.                9.  Pat yourself dry with a clean towel.            10.  Wear clean pajamas.            11.  Place clean sheets on your bed the night of your first shower and do not  sleep with pets. Day of Surgery : Do not apply any lotions/deodorants the morning of surgery.  Please wear clean clothes to the hospital/surgery center.  FAILURE TO FOLLOW THESE INSTRUCTIONS MAY RESULT IN THE CANCELLATION OF YOUR SURGERY PATIENT SIGNATURE_________________________________  NURSE SIGNATURE__________________________________  ________________________________________________________________________

## 2018-02-24 NOTE — Progress Notes (Signed)
Per Claiborne Billings at [place] per Dr Johney Maine patient to do one Ensure preop drink am of surgery. Nurse informed Claiborne Billings that patient would be called with clarification.

## 2018-02-24 NOTE — Progress Notes (Signed)
In reviewing medical history and preop instructions with patient, patient reported he is on a 30 ounce fluid restriction daily.  Called [facility] and spoke with Claiborne Billings , Triage and told her of abovef.  Told her that ERAS preop drinks ordered.  Asked her to ask Dr Johney Maine if MD wanted patient to have preop drink based on fluid restrction and the fact that surgery is at 3pm in the afternoon wilth clear liquids until 12 noon.  Kelly to ask Dr Johney Maine regarding above and be back in touch.

## 2018-02-24 NOTE — Progress Notes (Signed)
Patient made aware on 02/24/2018 at approximately 1230pm that when he comes to pick up Hibiclens on 02/25/2018 and preop Ensure Drink on 02/25/2018 that  Dr Clyda Greener office had called back and based on fluid restriction of patient he would only be receiving one preop Ensure drink per order of dr Johney Maine.  Patient verbalized understanding.

## 2018-02-24 NOTE — Progress Notes (Addendum)
Follow Rectal Prep Instructions per MD.  Focus on drinking liquids for1 -2 days prior to surgery.   Obtain bottle of Milk of Magnesia and Fleets Enema Day Prior to Surgery:  Drink liquids or pureed foods only   100pm Take 2 ounces of Milk of Magnesia ( Repeat if no effect in 2 hours).  No foods after midnite.    Clear liquids from 12 mindnite until 12noon day of surgery.    Fleets Enema Morning of Surgery   Please finish pre surgery Ensure Drink per Surgeon 3 hours prior to scheduled surgery time which needs to be completed at 12noon.

## 2018-02-24 NOTE — Progress Notes (Signed)
  CLEAR LIQUID DIET   Foods Allowed                                                                     Foods Excluded  Coffee and tea, regular and decaf                             liquids that you cannot  Plain Jell-O in any flavor                                             see through such as: Fruit ices (not with fruit pulp)                                     milk, soups, orange juice  Iced Popsicles                                    All solid food Carbonated beverages, regular and diet                                    Cranberry, grape and apple juices Sports drinks like Gatorade Lightly seasoned clear broth or consume(fat free) Sugar, honey syrup  Sample Menu Breakfast                                Lunch                                     Supper Cranberry juice                    Beef broth                            Chicken broth Jell-O                                     Grape juice                           Apple juice Coffee or tea                        Jell-O                                      Popsicle                                                  Coffee or tea                        Coffee or tea  _____________________________________________________________________   

## 2018-02-25 MED ORDER — BUPIVACAINE LIPOSOME 1.3 % IJ SUSP
20.0000 mL | INTRAMUSCULAR | Status: DC
  Filled 2018-02-25: qty 20

## 2018-02-25 NOTE — Progress Notes (Signed)
Patient came by and picked up hibiclens wash and Ensure Preop Drink x 1.  Reviewed preop written instructions with patient and patient voiced understanding . Patient home with written preop instructions.

## 2018-02-26 ENCOUNTER — Encounter (HOSPITAL_COMMUNITY): Payer: Self-pay | Admitting: Emergency Medicine

## 2018-02-26 ENCOUNTER — Other Ambulatory Visit: Payer: Self-pay

## 2018-02-26 ENCOUNTER — Encounter (HOSPITAL_COMMUNITY)
Admission: RE | Disposition: A | Discharge status: Home / Self Care | Payer: Self-pay | Source: Ambulatory Visit | Attending: Surgery

## 2018-02-26 ENCOUNTER — Ambulatory Visit (HOSPITAL_COMMUNITY): Payer: Managed Care, Other (non HMO) | Admitting: Certified Registered Nurse Anesthetist

## 2018-02-26 ENCOUNTER — Ambulatory Visit (HOSPITAL_COMMUNITY)
Admission: RE | Admit: 2018-02-26 | Discharge: 2018-02-26 | Disposition: A | Discharge status: Home / Self Care | Payer: Managed Care, Other (non HMO) | Source: Ambulatory Visit | Attending: Surgery | Admitting: Surgery

## 2018-02-26 DIAGNOSIS — N186 End stage renal disease: Secondary | ICD-10-CM | POA: Diagnosis not present

## 2018-02-26 DIAGNOSIS — Z79899 Other long term (current) drug therapy: Secondary | ICD-10-CM | POA: Insufficient documentation

## 2018-02-26 DIAGNOSIS — K219 Gastro-esophageal reflux disease without esophagitis: Secondary | ICD-10-CM | POA: Diagnosis not present

## 2018-02-26 DIAGNOSIS — Z992 Dependence on renal dialysis: Secondary | ICD-10-CM | POA: Diagnosis not present

## 2018-02-26 DIAGNOSIS — Z886 Allergy status to analgesic agent status: Secondary | ICD-10-CM | POA: Insufficient documentation

## 2018-02-26 DIAGNOSIS — Z8553 Personal history of malignant neoplasm of renal pelvis: Secondary | ICD-10-CM | POA: Insufficient documentation

## 2018-02-26 DIAGNOSIS — K644 Residual hemorrhoidal skin tags: Secondary | ICD-10-CM | POA: Diagnosis not present

## 2018-02-26 DIAGNOSIS — Z888 Allergy status to other drugs, medicaments and biological substances status: Secondary | ICD-10-CM | POA: Insufficient documentation

## 2018-02-26 DIAGNOSIS — K642 Third degree hemorrhoids: Secondary | ICD-10-CM

## 2018-02-26 DIAGNOSIS — Z87891 Personal history of nicotine dependence: Secondary | ICD-10-CM | POA: Diagnosis not present

## 2018-02-26 DIAGNOSIS — N529 Male erectile dysfunction, unspecified: Secondary | ICD-10-CM | POA: Diagnosis not present

## 2018-02-26 DIAGNOSIS — G4730 Sleep apnea, unspecified: Secondary | ICD-10-CM | POA: Diagnosis not present

## 2018-02-26 DIAGNOSIS — Z905 Acquired absence of kidney: Secondary | ICD-10-CM | POA: Diagnosis not present

## 2018-02-26 DIAGNOSIS — K649 Unspecified hemorrhoids: Secondary | ICD-10-CM | POA: Diagnosis present

## 2018-02-26 DIAGNOSIS — I120 Hypertensive chronic kidney disease with stage 5 chronic kidney disease or end stage renal disease: Secondary | ICD-10-CM | POA: Diagnosis not present

## 2018-02-26 HISTORY — PX: EVALUATION UNDER ANESTHESIA WITH HEMORRHOIDECTOMY: SHX5624

## 2018-02-26 LAB — BASIC METABOLIC PANEL
ANION GAP: 12 (ref 5–15)
BUN: 31 mg/dL — ABNORMAL HIGH (ref 6–20)
CALCIUM: 9.2 mg/dL (ref 8.9–10.3)
CHLORIDE: 97 mmol/L — AB (ref 101–111)
CO2: 33 mmol/L — AB (ref 22–32)
Creatinine, Ser: 10.15 mg/dL — ABNORMAL HIGH (ref 0.61–1.24)
GFR calc non Af Amer: 5 mL/min — ABNORMAL LOW (ref >60)
GFR, EST AFRICAN AMERICAN: 6 mL/min — AB (ref >60)
GLUCOSE: 163 mg/dL — AB (ref 65–99)
POTASSIUM: 4.5 mmol/L (ref 3.5–5.1)
Sodium: 142 mmol/L (ref 135–145)

## 2018-02-26 LAB — CBC
HEMATOCRIT: 30.1 % — AB (ref 39.0–52.0)
HEMOGLOBIN: 9.1 g/dL — AB (ref 13.0–17.0)
MCH: 28 pg (ref 26.0–34.0)
MCHC: 30.2 g/dL (ref 30.0–36.0)
MCV: 92.6 fL (ref 78.0–100.0)
Platelets: 192 10*3/uL (ref 150–400)
RBC: 3.25 MIL/uL — AB (ref 4.22–5.81)
RDW: 16.1 % — ABNORMAL HIGH (ref 11.5–15.5)
WBC: 4.7 10*3/uL (ref 4.0–10.5)

## 2018-02-26 SURGERY — EXAM UNDER ANESTHESIA WITH HEMORRHOIDECTOMY
Anesthesia: General

## 2018-02-26 MED ORDER — 0.9 % SODIUM CHLORIDE (POUR BTL) OPTIME
TOPICAL | Status: DC | PRN
  Administered 2018-02-26: 1000 mL

## 2018-02-26 MED ORDER — SUCCINYLCHOLINE CHLORIDE 200 MG/10ML IV SOSY
PREFILLED_SYRINGE | INTRAVENOUS | Status: DC | PRN
  Administered 2018-02-26: 120 mg via INTRAVENOUS

## 2018-02-26 MED ORDER — FENTANYL CITRATE (PF) 250 MCG/5ML IJ SOLN
INTRAMUSCULAR | Status: AC
  Filled 2018-02-26: qty 5

## 2018-02-26 MED ORDER — DIBUCAINE 1 % RE OINT
TOPICAL_OINTMENT | RECTAL | Status: DC | PRN
  Administered 2018-02-26: 1 via RECTAL

## 2018-02-26 MED ORDER — PHENYLEPHRINE HCL 10 MG/ML IJ SOLN
INTRAVENOUS | Status: DC | PRN
  Administered 2018-02-26: 40 ug/min via INTRAVENOUS

## 2018-02-26 MED ORDER — BUPIVACAINE-EPINEPHRINE 0.5% -1:200000 IJ SOLN
INTRAMUSCULAR | Status: DC | PRN
  Administered 2018-02-26: 20 mL

## 2018-02-26 MED ORDER — ACETAMINOPHEN 500 MG PO TABS
1000.0000 mg | ORAL_TABLET | ORAL | Status: DC
  Filled 2018-02-26: qty 2

## 2018-02-26 MED ORDER — SODIUM CHLORIDE 0.9 % IV SOLN
INTRAVENOUS | Status: DC
Start: 2018-02-26 — End: 2018-02-27
  Administered 2018-02-26: 13:00:00 via INTRAVENOUS

## 2018-02-26 MED ORDER — PHENYLEPHRINE 40 MCG/ML (10ML) SYRINGE FOR IV PUSH (FOR BLOOD PRESSURE SUPPORT)
PREFILLED_SYRINGE | INTRAVENOUS | Status: DC | PRN
  Administered 2018-02-26 (×2): 80 ug via INTRAVENOUS
  Administered 2018-02-26: 40 ug via INTRAVENOUS
  Administered 2018-02-26: 80 ug via INTRAVENOUS

## 2018-02-26 MED ORDER — BUPIVACAINE LIPOSOME 1.3 % IJ SUSP
INTRAMUSCULAR | Status: DC | PRN
  Administered 2018-02-26: 20 mL

## 2018-02-26 MED ORDER — CHLORHEXIDINE GLUCONATE CLOTH 2 % EX PADS: 6.0000 | MEDICATED_PAD | Freq: Once | CUTANEOUS | Status: DC

## 2018-02-26 MED ORDER — OXYCODONE HCL 5 MG PO TABS: 5.0000 mg | ORAL_TABLET | Freq: Four times a day (QID) | ORAL | 0 refills | Status: DC | PRN

## 2018-02-26 MED ORDER — LIDOCAINE 2% (20 MG/ML) 5 ML SYRINGE
INTRAMUSCULAR | Status: DC | PRN
  Administered 2018-02-26: 40 mg via INTRAVENOUS

## 2018-02-26 MED ORDER — BUPIVACAINE HCL (PF) 0.25 % IJ SOLN
INTRAMUSCULAR | Status: AC
  Filled 2018-02-26: qty 30

## 2018-02-26 MED ORDER — GABAPENTIN 300 MG PO CAPS
300.0000 mg | ORAL_CAPSULE | ORAL | Status: AC
  Administered 2018-02-26: 300 mg via ORAL

## 2018-02-26 MED ORDER — CEFAZOLIN SODIUM-DEXTROSE 2-4 GM/100ML-% IV SOLN
2.0000 g | INTRAVENOUS | Status: AC
  Administered 2018-02-26: 2 g via INTRAVENOUS

## 2018-02-26 MED ORDER — DIBUCAINE 1 % RE OINT
TOPICAL_OINTMENT | RECTAL | Status: AC
  Filled 2018-02-26: qty 28

## 2018-02-26 MED ORDER — LACTATED RINGERS IV SOLN: INTRAVENOUS | Status: DC

## 2018-02-26 MED ORDER — PROPOFOL 10 MG/ML IV BOLUS
INTRAVENOUS | Status: AC
  Filled 2018-02-26: qty 20

## 2018-02-26 MED ORDER — PROPOFOL 10 MG/ML IV BOLUS
INTRAVENOUS | Status: DC | PRN
  Administered 2018-02-26: 180 mg via INTRAVENOUS

## 2018-02-26 MED ORDER — METRONIDAZOLE IN NACL 5-0.79 MG/ML-% IV SOLN
500.0000 mg | INTRAVENOUS | Status: DC
  Filled 2018-02-26: qty 100

## 2018-02-26 MED ORDER — METRONIDAZOLE IN NACL 5-0.79 MG/ML-% IV SOLN
500.0000 mg | INTRAVENOUS | Status: AC
  Administered 2018-02-26: 500 mg via INTRAVENOUS

## 2018-02-26 MED ORDER — MIDAZOLAM HCL 5 MG/5ML IJ SOLN
INTRAMUSCULAR | Status: DC | PRN
  Administered 2018-02-26: 2 mg via INTRAVENOUS

## 2018-02-26 MED ORDER — ONDANSETRON HCL 4 MG/2ML IJ SOLN
INTRAMUSCULAR | Status: DC | PRN
  Administered 2018-02-26: 4 mg via INTRAVENOUS

## 2018-02-26 MED ORDER — FENTANYL CITRATE (PF) 100 MCG/2ML IJ SOLN: 25.0000 ug | INTRAMUSCULAR | Status: DC | PRN

## 2018-02-26 MED ORDER — BUPIVACAINE-EPINEPHRINE 0.5% -1:200000 IJ SOLN
INTRAMUSCULAR | Status: AC
  Filled 2018-02-26: qty 1

## 2018-02-26 MED ORDER — GABAPENTIN 300 MG PO CAPS
300.0000 mg | ORAL_CAPSULE | ORAL | Status: DC
  Filled 2018-02-26: qty 1

## 2018-02-26 MED ORDER — ACETAMINOPHEN 500 MG PO TABS
1000.0000 mg | ORAL_TABLET | ORAL | Status: AC
  Administered 2018-02-26: 1000 mg via ORAL

## 2018-02-26 MED ORDER — CEFAZOLIN SODIUM-DEXTROSE 2-4 GM/100ML-% IV SOLN
2.0000 g | INTRAVENOUS | Status: DC
  Filled 2018-02-26: qty 100

## 2018-02-26 MED ORDER — METHYLENE BLUE 0.5 % INJ SOLN
INTRAVENOUS | Status: AC
  Filled 2018-02-26: qty 10

## 2018-02-26 MED ORDER — AMBULATORY NON FORMULARY MEDICATION: 1.0000 "application " | Freq: Four times a day (QID) | 2 refills | Status: DC

## 2018-02-26 MED ORDER — MIDAZOLAM HCL 2 MG/2ML IJ SOLN
INTRAMUSCULAR | Status: AC
  Filled 2018-02-26: qty 2

## 2018-02-26 MED ORDER — FENTANYL CITRATE (PF) 100 MCG/2ML IJ SOLN
INTRAMUSCULAR | Status: DC | PRN
  Administered 2018-02-26: 100 ug via INTRAVENOUS

## 2018-02-26 SURGICAL SUPPLY — 35 items
APL SKNCLS STERI-STRIP NONHPOA (GAUZE/BANDAGES/DRESSINGS)
BENZOIN TINCTURE PRP APPL 2/3 (GAUZE/BANDAGES/DRESSINGS) ×1 IMPLANT
BLADE SURG 15 STRL LF DISP TIS (BLADE) ×1 IMPLANT
BLADE SURG 15 STRL SS (BLADE) ×3
BRIEF STRETCH FOR OB PAD LRG (UNDERPADS AND DIAPERS) ×3 IMPLANT
CONT SPEC 4OZ CLIKSEAL STRL BL (MISCELLANEOUS) ×1 IMPLANT
COVER SURGICAL LIGHT HANDLE (MISCELLANEOUS) ×3 IMPLANT
DECANTER SPIKE VIAL GLASS SM (MISCELLANEOUS) ×3 IMPLANT
DRAPE LAPAROTOMY T 102X78X121 (DRAPES) ×3 IMPLANT
DRSG PAD ABDOMINAL 8X10 ST (GAUZE/BANDAGES/DRESSINGS) ×2 IMPLANT
DRSG XEROFORM 1X8 (GAUZE/BANDAGES/DRESSINGS) ×2 IMPLANT
ELECT PENCIL ROCKER SW 15FT (MISCELLANEOUS) ×3 IMPLANT
ELECT REM PT RETURN 15FT ADLT (MISCELLANEOUS) ×3 IMPLANT
GAUZE SPONGE 4X4 12PLY STRL (GAUZE/BANDAGES/DRESSINGS) ×2 IMPLANT
GAUZE SPONGE 4X4 16PLY XRAY LF (GAUZE/BANDAGES/DRESSINGS) ×1 IMPLANT
GLOVE ECLIPSE 8.0 STRL XLNG CF (GLOVE) ×3 IMPLANT
GLOVE INDICATOR 8.0 STRL GRN (GLOVE) ×3 IMPLANT
GOWN STRL REUS W/TWL XL LVL3 (GOWN DISPOSABLE) ×6 IMPLANT
KIT BASIN OR (CUSTOM PROCEDURE TRAY) ×3 IMPLANT
LOOP VESSEL MAXI BLUE (MISCELLANEOUS) IMPLANT
LUBRICANT JELLY K Y 4OZ (MISCELLANEOUS) ×3 IMPLANT
NEEDLE HYPO 22GX1.5 SAFETY (NEEDLE) ×3 IMPLANT
PACK BASIC VI WITH GOWN DISP (CUSTOM PROCEDURE TRAY) ×3 IMPLANT
SCRUB TECHNI CARE 4 OZ NO DYE (MISCELLANEOUS) ×3 IMPLANT
SHEARS HARMONIC 9CM CVD (BLADE) IMPLANT
SUT CHROMIC 2 0 SH (SUTURE) ×4 IMPLANT
SUT CHROMIC 3 0 SH 27 (SUTURE) IMPLANT
SUT VIC AB 2-0 SH 27 (SUTURE)
SUT VIC AB 2-0 SH 27X BRD (SUTURE) IMPLANT
SUT VIC AB 2-0 UR6 27 (SUTURE) ×12 IMPLANT
SYR 20CC LL (SYRINGE) ×3 IMPLANT
SYR 3ML LL SCALE MARK (SYRINGE) ×3 IMPLANT
TOWEL OR 17X26 10 PK STRL BLUE (TOWEL DISPOSABLE) ×3 IMPLANT
TOWEL OR NON WOVEN STRL DISP B (DISPOSABLE) ×1 IMPLANT
YANKAUER SUCT BULB TIP 10FT TU (MISCELLANEOUS) ×3 IMPLANT

## 2018-02-26 NOTE — Op Note (Signed)
02/26/2018  6:04 PM  PATIENT:  Travis [name]  57 y.o. male  Patient Care Team: London Pepper, MD as PCP - General (Family Medicine) Laurence Spates, MD as Consulting Physician (Gastroenterology) Courage Boston, MD as Consulting Physician (General Surgery) [name], MD as Consulting Physician (Nephrology) Raynor, Renaldo Harrison, MD as Referring Physician (Urology)  PRE-OPERATIVE DIAGNOSIS:  Symptomatic hemorrhoids  POST-OPERATIVE DIAGNOSIS:  Symptomatic hemorrhoids Grade 3 with bleeding  PROCEDURE:    HEMORRHOIDECTOMY x 2 HEMORRHOIDAL LIGATION AND PEXY ANORECTAL EXAM UNDER ANESTHESIA   SURGEON:  [name], MD  ANESTHESIA:   General Anorectal & Local field block  0.25% bupivacaine with epinephrine at the beginning of the case. Liposomal bupivacaine (Experel) at the end of the case.  EBL:  Total I/O In: -  Out: 20 [Blood:20].  See operative record  Delay start of Pharmacological VTE agent (>24hrs) due to surgical blood loss or risk of bleeding:  NO  DRAINS: NONE  SPECIMEN:   Internal & external hemorrhoid x2  DISPOSITION OF SPECIMEN:  PATHOLOGY  COUNTS:  YES  PLAN OF CARE: Discharge home after PACU  PATIENT DISPOSITION:  PACU - hemodynamically stable.  INDICATION: Pleasant patient with struggles with hemorrhoids.  Not able to be managed in the office despite an improved bowel regimen.  I recommended examination under anesthesia and surgical treatment:  The anatomy & physiology of the anorectal region was discussed.  The pathophysiology of hemorrhoids and differential diagnosis was discussed.  Natural history risks without surgery was discussed.   I stressed the importance of a bowel regimen to have daily soft bowel movements to minimize progression of disease.  Interventions such as sclerotherapy & banding were discussed.  The patient's symptoms are not adequately controlled by medicines and other non-operative treatments.  I feel the risks & problems of  no surgery outweigh the operative risks; therefore, I recommended surgery to treat the hemorrhoids by ligation, pexy, and possible resection.  Risks such as bleeding, infection, need for further treatment, heart attack, death, and other risks were discussed.   I noted a good likelihood this will help address the problem.  Goals of post-operative recovery were discussed as well.  Possibility that this will not correct all symptoms was explained.  Post-operative pain, bleeding, constipation, urinary difficulties, and other problems after surgery were discussed.  We will work to minimize complications.   Educational handouts further explaining the pathology, treatment options, and bowel regimen were given as well.  Questions were answered.  The patient expresses understanding & wishes to proceed with surgery.  OR FINDINGS: Enlarged inflamed hemorrhoids.  Left lateral and right anterior grade 3.  Ligated in size.  Right posterior more grade 2.  DESCRIPTION:   Informed consent was confirmed. Patient underwent general anesthesia without difficulty. Patient was placed into prone positioning.  The perianal region was prepped and draped in sterile fashion. Surgical time-out confirmed our plan.  I did digital rectal examination and then transitioned over to anoscopy to get a sense of the anatomy.  Findings noted above.   I proceeded to do hemorrhoidal ligation and pexy.  I used a 2-0 Vicryl suture on a UR-6 needle in a figure-of-eight fashion 6 cm proximal to the anal verge.  I started at the largest hemorrhoid pile.  Because of redundant hemorrhoidal tissue too bulky to merely ligate or pexy, I excised the excess internal hemorrhoid piles longitudinally in a fusiform biconcave fashion, at the  right anterior and left lateral location, sparing the anal canal to avoid  narrowing.  I then ran that stitch longitudinally more distally to close the hemorrhoidectomy wound to the anal verge over a large Hill-Furgeson  retractor to avoid narrowing of the anal canal.  I then tied that stitch down to cause a hemorrhoidopexy.   I then did hemorrhoidal ligation and pexy at the other 4 columns.  At the completion of this, all 6 anorectal columns were ligated and pexied in the classic hexagonal fashion (right anterior/lateral/posterior, left anterior/lateral/posterior).  I closed the external part of the left lateral right anterior hemorrhoidectomy wounds with interrupted horizontal mattress 2-0 chromic suture, leaving the last 5 mm open to allow natural drainage.    I redid anoscopy & examination.  At completion of this, all hemorrhoids had been removed or reduced into the rectum.  There is no more prolapse.  Internal & external anatomy was more more normal.  Hemostasis was good.  Fluffed gauze was on-laid over the perianal region.  No packing done.  Patient is being extubated go to go to the recovery room.  I had discussed postop care in detail with the patient in the preop holding area.  Instructions for post-operative recovery and prescriptions are written. I discussed operative findings, updated the patient's status, discussed probable steps to recovery, and gave postoperative recommendations to the patient's spouse.  Recommendations were made.  Questions were answered.  She expressed understanding & appreciation.  [name], M.D., F.A.C.S. Gastrointestinal and Minimally Invasive Surgery [facility] [address], [place], [place] [address] [phone] Main / Paging

## 2018-02-26 NOTE — Progress Notes (Signed)
Dr. Glennon Mac updated on pt labs.

## 2018-02-26 NOTE — Anesthesia Preprocedure Evaluation (Signed)
Anesthesia Evaluation  Patient identified by MRN, date of birth, ID band Patient awake    Reviewed: Allergy & Precautions, NPO status , Patient's Chart, lab work & pertinent test results  Airway Mallampati: II  TM Distance: >3 FB Neck ROM: Full    Dental   Pulmonary sleep apnea , former smoker,    Pulmonary exam normal        Cardiovascular hypertension, Pt. on medications Normal cardiovascular exam     Neuro/Psych    GI/Hepatic GERD  Medicated and Controlled,  Endo/Other    Renal/GU Dialysis and ESRFRenal disease     Musculoskeletal   Abdominal   Peds  Hematology   Anesthesia Other Findings   Reproductive/Obstetrics                             Anesthesia Physical  Anesthesia Plan  ASA: III and emergent  Anesthesia Plan: General   Post-op Pain Management:    Induction: Intravenous, Rapid sequence and Cricoid pressure planned  PONV Risk Score and Plan: 2 and Ondansetron and Treatment may vary due to age or medical condition  Airway Management Planned: Oral ETT  Additional Equipment:   Intra-op Plan:   Post-operative Plan: Extubation in OR  Informed Consent: I have reviewed the patients History and Physical, chart, labs and discussed the procedure including the risks, benefits and alternatives for the proposed anesthesia with the patient or authorized representative who has indicated his/her understanding and acceptance.     Plan Discussed with: CRNA and Surgeon  Anesthesia Plan Comments:         Anesthesia Quick Evaluation

## 2018-02-26 NOTE — Transfer of Care (Signed)
Immediate Anesthesia Transfer of Care Note  Patient: KAZIMIR HARTNETT  Procedure(s) Performed: Procedure(s): ANORECTAL EXAM UNDER ANESTHESIA  HEMORRHOIDECTOMY x 2  HEMORRHOIDAL LIGATION AND PEXY (N/A)  Patient Location: PACU  Anesthesia Type:General  Level of Consciousness:  sedated, patient cooperative and responds to stimulation  Airway & Oxygen Therapy:Patient Spontanous Breathing and Patient connected to face mask oxgen  Post-op Assessment:  Report given to PACU RN and Post -op Vital signs reviewed and stable  Post vital signs:  Reviewed and stable  Last Vitals:  Vitals:   02/26/18 1248  BP: (!) 161/98  Pulse: 76  Resp: 18  Temp: 37 C  SpO2: 23%    Complications: No apparent anesthesia complications

## 2018-02-26 NOTE — Progress Notes (Signed)
Dialysis Shunt present upon arrival to PACU, Positive Thrill and Bruit/ (left upper arm, left radial pulse present and strong)

## 2018-02-26 NOTE — Interval H&P Note (Signed)
History and Physical Interval Note:  02/26/2018 4:31 PM  Travis [name]  has presented today for surgery, with the diagnosis of Symptomatic hemorrhoids  The various methods of treatment have been discussed with the patient and family. After consideration of risks, benefits and other options for treatment, the patient has consented to  Procedure(s): [place] (N/A) as a surgical intervention .  The patient's history has been reviewed, patient examined, no change in status, stable for surgery.  I have reviewed the patient's chart and labs.  Questions were answered to the patient's satisfaction.    I have re-reviewed the the patient's records, history, medications, and allergies.  I have re-examined the patient.  I again discussed intraoperative plans and goals of post-operative recovery.  The patient agrees to proceed.  Travis [name]  31-Mar-1961 [id number]  Patient Care Team: London Pepper, MD as PCP - General (Family Medicine) Laurence Spates, MD as Consulting Physician (Gastroenterology) Jakell Boston, MD as Consulting Physician (General Surgery) [name], MD as Consulting Physician (Nephrology) Raynor, Renaldo Harrison, MD as Referring Physician (Urology)  Patient Active Problem List   Diagnosis Date Noted  . Mass   . Anemia due to chronic blood loss 01/30/2015  . BRBPR (bright red blood per rectum) 01/30/2015  . Essential hypertension 01/30/2015  . Symptomatic anemia 01/30/2015  . End stage renal disease ([place]) 08/26/2014  . Drainage from wound-Left upper arm 08/11/2014  . CKD (chronic kidney disease), stage V ([place]) 12/31/2013    Past Medical History:  Diagnosis Date  . Anemia 01/30/2015  . Chronic kidney disease   . GERD (gastroesophageal reflux disease)    takes Nexium  . Hypertension   . Pneumonia    as a child  . Sleep apnea    uses c-pap 2 yrs    Past Surgical  History:  Procedure Laterality Date  . AV FISTULA PLACEMENT Left 12/22/2017   Procedure: REPAIR PSEUDOANEURYSM ARTERIOVENOUS (AV) FISTULA;  Surgeon: [name], MD;  Location: [place];  Service: Vascular;  Laterality: Left;  . BASCILIC VEIN TRANSPOSITION Left 01/05/2014   Procedure: LEFT 1ST STAGE BASCILIC VEIN TRANSPOSITION;  Surgeon: Conrad Keewatin, MD;  Location: [facility];  Service: Vascular;  Laterality: Left;  . BASCILIC VEIN TRANSPOSITION Left 07/26/2014   Procedure: Left Arm Brachial Vein Transposition Second Stage;  Surgeon: Conrad Weston, MD;  Location: [facility];  Service: Vascular;  Laterality: Left;  . COLONOSCOPY    . COLONOSCOPY N/A 02/02/2015   Procedure: COLONOSCOPY;  Surgeon: [name], MD;  Location: [facility] ENDOSCOPY;  Service: Endoscopy;  Laterality: N/A;  . ESOPHAGOGASTRODUODENOSCOPY Left 01/31/2015   Procedure: ESOPHAGOGASTRODUODENOSCOPY (EGD);  Surgeon: [name], MD;  Location: [facility] ENDOSCOPY;  Service: Endoscopy;  Laterality: Left;  . GIVENS CAPSULE STUDY N/A 01/31/2015   Procedure: GIVENS CAPSULE STUDY;  Surgeon: [name], MD;  Location: [facility] ENDOSCOPY;  Service: Endoscopy;  Laterality: N/A;  . MOUTH SURGERY     teeth cleaning  . NEPHRECTOMY RADICAL Right 05/2015   [facility] Dr [name] for R renal mass    Social History   Socioeconomic History  . Marital status: Married    Spouse name: Not on file  . Number of children: Not on file  . Years of education: Not on file  . Highest education level: Not on file  Occupational History  . Not on file  Social Needs  . Financial resource strain: Not on file  . Food insecurity:  Worry: Not on file    Inability: Not on file  . Transportation needs:    Medical: Not on file    Non-medical: Not on file  Tobacco Use  . Smoking status: Former Smoker    Years: 4.00    Types: Cigars    Last attempt to quit: 12/31/2012    Years since quitting: 5.1  . Smokeless tobacco: Never Used  Substance and Sexual Activity  . Alcohol use: No   . Drug use: No  . Sexual activity: Not on file  Lifestyle  . Physical activity:    Days per week: Not on file    Minutes per session: Not on file  . Stress: Not on file  Relationships  . Social connections:    Talks on phone: Not on file    Gets together: Not on file    Attends religious service: Not on file    Active member of club or organization: Not on file    Attends meetings of clubs or organizations: Not on file    Relationship status: Not on file  . Intimate partner violence:    Fear of current or ex partner: Not on file    Emotionally abused: Not on file    Physically abused: Not on file    Forced sexual activity: Not on file  Other Topics Concern  . Not on file  Social History Narrative  . Not on file    Family History  Problem Relation Age of Onset  . Hypertension Mother   . Hypertension Father   . Hypertension Sister     Medications Prior to Admission  Medication Sig Dispense Refill Last Dose  . amLODipine (NORVASC) 10 MG tablet Take 10 mg by mouth daily.   02/26/2018 at 0800  . AURYXIA 1 GM 210 MG(Fe) tablet Take 210-630 mg by mouth See admin instructions. 648m three times daily with meals and 210-4237mwith snacks  11 02/26/2018 at 0800  . iron polysaccharides (NIFEREX) 150 MG capsule Take 1 capsule (150 mg total) by mouth 2 (two) times daily. 60 capsule 1 02/25/2018 at Unknown time  . labetalol (NORMODYNE) 300 MG tablet Take 1 tablet (300 mg total) by mouth 2 (two) times daily. 60 tablet 1 02/26/2018 at 800 am  . tamsulosin (FLOMAX) 0.4 MG CAPS capsule Take 0.4 mg by mouth daily.  11 02/25/2018 at Unknown time  . VIAGRA 50 MG tablet Take 25 mg by mouth as needed for erectile dysfunction.    Past Month at Unknown time  . hydrALAZINE (APRESOLINE) 25 MG tablet Take 1 tablet (25 mg total) by mouth 3 (three) times daily. (Patient not taking: Reported on 12/22/2017) 90 tablet 0 Not Taking at Unknown time    Current Facility-Administered Medications  Medication Dose  Route Frequency Provider Last Rate Last Dose  . 0.9 %  sodium chloride infusion   Intravenous Continuous JaLyndle HerrlichMD 10 mL/hr at 02/26/18 1319    . bupivacaine liposome (EXPAREL) 1.3 % injection 266 mg  20 mL Infiltration On Call to OR GrMichael BostonMD      . metroNIDAZOLE (FLAGYL) IVPB 500 mg  500 mg Intravenous On Call to OR GrMichael BostonMD       And  . ceFAZolin (ANCEF) IVPB 2g/100 mL premix  2 g Intravenous On Call to OR GrMichael BostonMD      . Chlorhexidine Gluconate Cloth 2 % PADS 6 each  6 each Topical Once GrMichael BostonMD  And  . Chlorhexidine Gluconate Cloth 2 % PADS 6 each  6 each Topical Once Yobany Boston, MD         Allergies  Allergen Reactions  . Ibuprofen Hives  . Lisinopril Swelling    PT states he is allergic to all prils; caused facial swelling  . Naproxen Hives and Other (See Comments)    Alleve causes patient to have hives    BP (!) 161/98   Pulse 76   Temp 98.6 F (37 C) (Oral)   Resp 18   Ht 5' 11"  (1.803 m)   Wt 95 kg (209 lb 6 oz)   SpO2 98%   BMI 29.20 kg/m   Labs: Results for orders placed or performed during the hospital encounter of 02/26/18 (from the past 48 hour(s))  Basic metabolic panel     Status: Abnormal   Collection Time: 02/26/18  1:15 PM  Result Value Ref Range   Sodium 142 135 - 145 mmol/L   Potassium 4.5 3.5 - 5.1 mmol/L   Chloride 97 (L) 101 - 111 mmol/L   CO2 33 (H) 22 - 32 mmol/L   Glucose, Bld 163 (H) 65 - 99 mg/dL   BUN 31 (H) 6 - 20 mg/dL   Creatinine, Ser 10.15 (H) 0.61 - 1.24 mg/dL   Calcium 9.2 8.9 - 10.3 mg/dL   GFR calc non Af Amer 5 (L) >60 mL/min   GFR calc Af Amer 6 (L) >60 mL/min    Comment: (NOTE) The eGFR has been calculated using the CKD EPI equation. This calculation has not been validated in all clinical situations. eGFR's persistently <60 mL/min signify possible Chronic Kidney Disease.    Anion gap 12 5 - 15    Comment: Performed at [facility], [place] [address]  [place], [place] [address]  CBC     Status: Abnormal   Collection Time: 02/26/18  1:15 PM  Result Value Ref Range   WBC 4.7 4.0 - 10.5 K/uL   RBC 3.25 (L) 4.22 - 5.81 MIL/uL   Hemoglobin 9.1 (L) 13.0 - 17.0 g/dL   HCT 30.1 (L) 39.0 - 52.0 %   MCV 92.6 78.0 - 100.0 fL   MCH 28.0 26.0 - 34.0 pg   MCHC 30.2 30.0 - 36.0 g/dL   RDW 16.1 (H) 11.5 - 15.5 %   Platelets 192 150 - 400 K/uL    Comment: Performed at [facility], [place] [address], [place], [place] [address]    Imaging / Studies: No results found.   [name], M.D., F.A.C.S. Gastrointestinal and Minimally Invasive Surgery [facility] [address], [place], [place] [address] [phone] Main / Paging  02/26/2018 4:31 PM    [name]

## 2018-02-26 NOTE — H&P (Signed)
Travis [name] Documented: 02/10/2018 9:17 AM Location: [facility] Patient #: [id number] DOB: 24-Jan-1961 Married / Language: English / Race: Black or African American Male   History of Present Illness Travis Hector MD; 02/10/2018 1:08 PM) The patient is a 58 year old male who presents with hemorrhoids. Note for "Hemorrhoids": ` ` ` Patient sent for surgical consultation at the request of Dr. Leonie Douglas, [facility]  Chief Complaint: Rectal bleeding. Presumed hemorrhoids  The patient is a pleasant male struggling with intermittent rectal bleeding. Normal colonoscopies. Known hemorrhoids. Followed by [facility] gastroenterology. Has renal failure and gets hemodialysis on Monday Wednesday and Friday at [facility]. followed by Dr. [name] with nephrology. Stable for the past two years. History of right nephrectomy for lead cancer. Usually moves his bowels about five or six times a day. Usually they are small & hard. He feels like the phosphorous binders constipate him & he has to strain to have a BM. He has not had any prior hemorrhoidal interventions such as injections or bandings. He can walk a few miles without difficulty. Because of persistent recurrent rectal bleeding, surgical consultation requested for presumed hemorrhoidal bleeding since colonoscopies have been underwhelming. he does note that he is felt something pop out intermittently. Usually goes back in after he wipes. Not having to force it back in. Pretty much gets bright red blood with every bowel movement now. Not on a fiber supplement.  No personal nor family history of GI/colon cancer, inflammatory bowel disease, irritable bowel syndrome, allergy such as Celiac Sprue, dietary/dairy problems, colitis, ulcers nor gastritis. No recent sick contacts/gastroenteritis. No travel outside the country. No changes in diet. No dysphagia to solids or liquids. No significant heartburn or reflux. No  hematemesis, coffee ground emesis, melena. No evidence of prior gastric/peptic ulceration.  (Review of systems as stated in this history (HPI) or in the review of systems. Otherwise all other 12 point ROS are negative)   Past Surgical History Illene Regulus, CMA; 02/10/2018 10:45 AM) Dialysis Shunt / Fistula  Nephrectomy  Right. Oral Surgery   Diagnostic Studies History Illene Regulus, CMA; 02/10/2018 10:45 AM) Colonoscopy  1-5 years ago  Allergies (Tanisha A. Owens Shark, [place]; 02/10/2018 9:18 AM) No Known Drug Allergies [02/10/2018]: Allergies Reconciled   Medication History (Tanisha A. Owens Shark, [place]; 02/10/2018 9:18 AM) AmLODIPine Besylate (10MG  Tablet, Oral) Active. Auryxia (1 [id number] MG(Fe) Tablet, Oral) Active. Labetalol HCl (300MG  Tablet, Oral) Active. Medications Reconciled  Family History Illene Regulus, CMA; 02/10/2018 10:45 AM) Hypertension  Father, Mother, Sister. Ischemic Bowel Disease  Father.  Other Problems Illene Regulus, CMA; 02/10/2018 10:45 AM) Chronic Renal Failure Syndrome  Gastroesophageal Reflux Disease  Hemorrhoids  High blood pressure  Sleep Apnea  Transfusion history     Review of Systems (Alisha Spillers CMA; 02/10/2018 10:45 AM) General Not Present- Appetite Loss, Chills, Fatigue, Fever, Night Sweats, Weight Gain and Weight Loss. Skin Present- Dryness. Not Present- Change in Wart/Mole, Hives, Jaundice, New Lesions, Non-Healing Wounds, Rash and Ulcer. HEENT Not Present- Earache, Hearing Loss, Hoarseness, Nose Bleed, Oral Ulcers, Ringing in the Ears, Seasonal Allergies, Sinus Pain, Sore Throat, Visual Disturbances, Wears glasses/contact lenses and Yellow Eyes. Respiratory Not Present- Bloody sputum, Chronic Cough, Difficulty Breathing, Snoring and Wheezing. Cardiovascular Not Present- Chest Pain, Difficulty Breathing Lying Down, Leg Cramps, Palpitations, Rapid Heart Rate, Shortness of Breath and Swelling of Extremities. Gastrointestinal Present-  Bloody Stool, Constipation and Hemorrhoids. Not Present- Abdominal Pain, Bloating, Change in Bowel Habits, Chronic diarrhea, Difficulty Swallowing, Excessive gas, Gets full  quickly at meals, Indigestion, Nausea, Rectal Pain and Vomiting. Male Genitourinary Present- Change in Urinary Stream, Frequency, Nocturia and Urgency. Not Present- Blood in Urine, Impotence, Painful Urination and Urine Leakage. Neurological Not Present- Decreased Memory, Fainting, Headaches, Numbness, Seizures, Tingling, Tremor, Trouble walking and Weakness. Psychiatric Not Present- Anxiety, Bipolar, Change in Sleep Pattern, Depression, Fearful and Frequent crying. Endocrine Not Present- Cold Intolerance, Excessive Hunger, Hair Changes, Heat Intolerance, Hot flashes and New Diabetes. Hematology Not Present- Blood Thinners, Easy Bruising, Excessive bleeding, Gland problems, HIV and Persistent Infections.  Vitals (Tanisha A. Brown RMA; 02/10/2018 9:18 AM) 02/10/2018 9:17 AM Weight: 218 lb Height: 71in Body Surface Area: 2.19 m Body Mass Index: 30.4 kg/m  Temp.: 98.50F  Pulse: 79 (Regular)  BP: 142/92 (Sitting, Left Arm, Standard)       Physical Exam Travis Hector MD; 02/10/2018 1:07 PM) General Mental Status-Alert. General Appearance-Not in acute distress, Not Sickly. Orientation-Oriented X3. Hydration-Well hydrated. Voice-Normal.  Integumentary Global Assessment Upon inspection and palpation of skin surfaces of the - Axillae: non-tender, no inflammation or ulceration, no drainage. and Distribution of scalp and body hair is normal. General Characteristics Temperature - normal warmth is noted.  Head and Neck Head-normocephalic, atraumatic with no lesions or palpable masses. Face Global Assessment - atraumatic, no absence of expression. Neck Global Assessment - no abnormal movements, no bruit auscultated on the right, no bruit auscultated on the left, no decreased range of motion,  non-tender. Trachea-midline. Thyroid Gland Characteristics - non-tender.  Eye Eyeball - Left-Extraocular movements intact, No Nystagmus. Eyeball - Right-Extraocular movements intact, No Nystagmus. Cornea - Left-No Hazy. Cornea - Right-No Hazy. Sclera/Conjunctiva - Left-No scleral icterus, No Discharge. Sclera/Conjunctiva - Right-No scleral icterus, No Discharge. Pupil - Left-Direct reaction to light normal. Pupil - Right-Direct reaction to light normal.  ENMT Ears Pinna - Left - no drainage observed, no generalized tenderness observed. Right - no drainage observed, no generalized tenderness observed. Nose and Sinuses External Inspection of the Nose - no destructive lesion observed. Inspection of the nares - Left - quiet respiration. Right - quiet respiration. Mouth and Throat Lips - Upper Lip - no fissures observed, no pallor noted. Lower Lip - no fissures observed, no pallor noted. Nasopharynx - no discharge present. Oral Cavity/Oropharynx - Tongue - no dryness observed. Oral Mucosa - no cyanosis observed. Hypopharynx - no evidence of airway distress observed.  Chest and Lung Exam Inspection Movements - Normal and Symmetrical. Accessory muscles - No use of accessory muscles in breathing. Palpation Palpation of the chest reveals - Non-tender. Auscultation Breath sounds - Normal and Clear.  Cardiovascular Auscultation Rhythm - Regular. Murmurs & Other Heart Sounds - Auscultation of the heart reveals - No Murmurs and No Systolic Clicks.  Abdomen Inspection Inspection of the abdomen reveals - No Visible peristalsis and No Abnormal pulsations. Umbilicus - No Bleeding, No Urine drainage. Palpation/Percussion Palpation and Percussion of the abdomen reveal - Soft, Non Tender, No Rebound tenderness, No Rigidity (guarding) and No Cutaneous hyperesthesia. Note: Abdomen soft. Nontender. Not distended. No umbilical or incisional hernias. No guarding.   Male  Genitourinary Sexual Maturity Tanner 5 - Adult hair pattern and Adult penile size and shape. Note: No inguinal hernias. Normal external genitalia. Epididymi, testes, and spermatic cords normal without any masses.   Rectal Note: perianal skin with chronic thickening and irritation consistent with some pruritis. good hygiene. No pilonidal disease. No fissure. No abscess/fistula. Normal sphincter tone. No external hemorrhoids. No condyloma warts.  Sensitive to digital rectal exam. No rectal masses.  Hemorrhoidal piles Enalrged & inflaed. R side friable. Cannot tolerate anoscopy   Peripheral Vascular Upper Extremity Inspection - Left - No Cyanotic nailbeds, Not Ischemic. Right - No Cyanotic nailbeds, Not Ischemic.  Neurologic Neurologic evaluation reveals -normal attention span and ability to concentrate, able to name objects and repeat phrases. Appropriate fund of knowledge , normal sensation and normal coordination. Mental Status Affect - not angry, not paranoid. Cranial Nerves-Normal Bilaterally. Gait-Normal.  Neuropsychiatric Mental status exam performed with findings of-able to articulate well with normal speech/language, rate, volume and coherence, thought content normal with ability to perform basic computations and apply abstract reasoning and no evidence of hallucinations, delusions, obsessions or homicidal/suicidal ideation.  Musculoskeletal Global Assessment Spine, Ribs and Pelvis - no instability, subluxation or laxity. Right Upper Extremity - no instability, subluxation or laxity. Note: Left upper arm AV fistula with good palpable thrill   Lymphatic Head & Neck  General Head & Neck Lymphatics: Bilateral - Description - No Localized lymphadenopathy. Axillary  General Axillary Region: Bilateral - Description - No Localized lymphadenopathy. Femoral & Inguinal  Generalized Femoral & Inguinal Lymphatics: Left - Description - No Localized lymphadenopathy. Right  - Description - No Localized lymphadenopathy.    Assessment & Plan Travis Hector MD; 02/10/2018 9:48 AM) PROLAPSED INTERNAL HEMORRHOIDS, GRADE 3 (K64.2) Impression: History of intermittently prolapsing hemorrhoids. Not actively prolapsing today but obviously inflamed and uncomfortable. Not thrombosed. Cannot tolerate anoscopy.  The anatomy & physiology of the anorectal region was discussed. The pathophysiology of hemorrhoids and differential diagnosis was discussed. Natural history progression was discussed. I stressed the importance of a bowel regimen to have daily soft bowel movements to minimize progression of disease. Goal of one BM / day ideal. Use of wet wipes, warm baths, avoiding straining, etc were emphasized.  Educational handouts further explaining the pathology, treatment options, and bowel regimen were given as well. The patient expressed understanding.  I think he would benefit from improvement in the anatomy. Surgery really the only option at this point since he's far too sensitive to try and do an office procedure. He wishes to do that as well.  He is a dialysis patient but is well controlled and followed by nephrology. We will do this on and off dialysis day, Thursday or Tuesday. Current Plans Pt Education - [facility] Hemorrhoids (Bernestine Holsapple): discussed with patient and provided information. Pt Education - Pamphlet Given - The Hemorrhoid Book: discussed with patient and provided information. PROLAPSED INTERNAL HEMORRHOIDS, GRADE 2 (K64.1) INTERNAL BLEEDING HEMORRHOIDS (K64.8) ENCOUNTER FOR PREOPERATIVE EXAMINATION FOR GENERAL SURGICAL PROCEDURE (Z01.818) Current Plans You are being scheduled for surgery- Our schedulers will call you.  You should hear from our office's scheduling department within 5 working days about the location, date, and time of surgery. We try to make accommodations for patient's preferences in scheduling surgery, but sometimes the OR schedule or the surgeon's  schedule prevents [name] from making those accommodations.  If you have not heard from our office [phone]) in 5 working days, call the office and ask for your surgeon's nurse.  If you have other questions about your diagnosis, plan, or surgery, call the office and ask for your surgeon's nurse.  Pt Education - [facility] Rectal Prep for Anorectal outpatient/office surgery: discussed with patient and provided information. Pt Education - [facility] Rectal Surgery HCI (Florita Nitsch): discussed with patient and provided information. Pt Education - [facility] Good Bowel Health (Shanna Un) ESRD ON DIALYSIS (N18.6) Impression: Been on hemodialysis for the past 2 years. Followed closely by nephrology. Dr. Caren Griffins  Lorrene Reid. Monday Wednesday Friday at [facility]  We will plan to do this on an off dialysis day, Tuesday or Thursday.  Travis [name], M.D., F.A.C.S. Gastrointestinal and Minimally Invasive Surgery [facility] [address], [place], [place] [address] [phone] Main / Paging

## 2018-02-26 NOTE — Anesthesia Procedure Notes (Signed)
Procedure Name: Intubation Date/Time: 02/26/2018 4:46 PM Performed by: Anne Fu, CRNA Pre-anesthesia Checklist: Patient identified, Emergency Drugs available, Suction available, Patient being monitored and Timeout performed Patient Re-evaluated:Patient Re-evaluated prior to induction Oxygen Delivery Method: Circle system utilized Preoxygenation: Pre-oxygenation with 100% oxygen Induction Type: IV induction Ventilation: Mask ventilation without difficulty Laryngoscope Size: Mac and 4 Grade View: Grade I Tube type: Oral Tube size: 7.5 mm Number of attempts: 1 Airway Equipment and Method: Stylet Placement Confirmation: ETT inserted through vocal cords under direct vision,  positive ETCO2 and breath sounds checked- equal and bilateral Secured at: 23 cm Tube secured with: Tape Dental Injury: Teeth and Oropharynx as per pre-operative assessment

## 2018-02-26 NOTE — Discharge Instructions (Signed)
General Anesthesia, Adult, Care After These instructions provide you with information about caring for yourself after your procedure. Your health care provider may also give you more specific instructions. Your treatment has been planned according to current medical practices, but problems sometimes occur. Call your health care provider if you have any problems or questions after your procedure. What can I expect after the procedure? After the procedure, it is common to have:  Vomiting.  A sore throat.  Mental slowness.  It is common to feel:  Nauseous.  Cold or shivery.  Sleepy.  Tired.  Sore or achy, even in parts of your body where you did not have surgery.  Follow these instructions at home: For at least 24 hours after the procedure:  Do not: ? Participate in activities where you could fall or become injured. ? Drive. ? Use heavy machinery. ? Drink alcohol. ? Take sleeping pills or medicines that cause drowsiness. ? Make important decisions or sign legal documents. ? Take care of children on your own.  Rest. Eating and drinking  If you vomit, drink water, juice, or soup when you can drink without vomiting.  Drink enough fluid to keep your urine clear or pale yellow.  Make sure you have little or no nausea before eating solid foods.  Follow the diet recommended by your health care provider. General instructions  Have a responsible adult stay with you until you are awake and alert.  Return to your normal activities as told by your health care provider. Ask your health care provider what activities are safe for you.  Take over-the-counter and prescription medicines only as told by your health care provider.  If you smoke, do not smoke without supervision.  Keep all follow-up visits as told by your health care provider. This is important. Contact a health care provider if:  You continue to have nausea or vomiting at home, and medicines are not helpful.  You  cannot drink fluids or start eating again.  You cannot urinate after 8-12 hours.  You develop a skin rash.  You have fever.  You have increasing redness at the site of your procedure. Get help right away if:  You have difficulty breathing.  You have chest pain.  You have unexpected bleeding.  You feel that you are having a life-threatening or urgent problem. This information is not intended to replace advice given to you by your health care provider. Make sure you discuss any questions you have with your health care provider. Document Released: 03/03/2001 Document Revised: 04/29/2016 Document Reviewed: 11/09/2015 Elsevier Interactive Patient Education  2018 [place]:  POST OPERATIVE INSTRUCTIONS  ######################################################################  EAT Start with a pureed / full liquid diet After 24 hours, gradually transition to a high fiber diet.    CONTROL PAIN Control pain so you can tolerate bowel movements,  walk, sleep, tolerate sneezing/coughing, and go up/down stairs.   HAVE A BOWEL MOVEMENT DAILY Keep your bowels regular to avoid problems.   Taking a fiber supplement every day to keep bowels soft.   Try a laxative to override constipation. Use an antidairrheal to slow down diarrhea.   Call if not better after 2 tries  WALK Walk an hour a day.  Control your pain to do that.   CALL IF YOU HAVE PROBLEMS/CONCERNS Call if you are still struggling despite following these instructions. Call if you have concerns not answered by these instructions  ######################################################################    1. Take your usually prescribed home medications unless  otherwise directed. 2. DIET: Follow a light bland diet the first 24 hours after arrival home, such as soup, liquids, crackers, etc.  Be sure to include lots of fluids daily.  Avoid fast food or heavy meals as your are more likely to get nauseated.   Eat a low fat the next few days after surgery.   3. PAIN CONTROL: a. Pain is best controlled by a usual combination of three different methods TOGETHER: i. Ice/Heat ii. Over the counter pain medication iii. Prescription pain medication b. Expect swelling and discomfort in the anus/rectal area.  Warm water baths (30-60 minutes up to 6 times a day, especially after bowel meovements) will help. Use ice for the first few days to help decrease swelling and bruising, then switch to heat such as warm towels, sitz baths, warm baths, etc to help relax tight/sore spots and speed recovery.  Some people prefer to use ice alone, heat alone, alternating between ice & heat.  Experiment to what works for you.   c. It is helpful to take an over-the-counter pain medication regularly for the first few weeks.  Choose one of the following that works best for you: i. Naproxen (Aleve, etc)  Two 220mg  tabs twice a day ii. Ibuprofen (Advil, etc) Three 200mg  tabs four times a day (every meal & bedtime) iii. Acetaminophen (Tylenol, etc) 500-650mg  four times a day (every meal & bedtime) d. A  prescription for pain medication (such as oxycodone, hydrocodone, etc) should be given to you upon discharge.  Take your pain medication as prescribed.  i. If you are having problems/concerns with the prescription medicine (does not control pain, nausea, vomiting, rash, itching, etc), please call us [phone] to see if we need to switch you to a different pain medicine that will work better for you and/or control your side effect better. ii. If you need a refill on your pain medication, please contact your pharmacy.  They will contact our office to request authorization. Prescriptions will not be filled after 5 pm or on week-ends.  Use a Sitz Bath 4-8 times a day for relief   [employer] A sitz bath is a warm water bath taken in the sitting position that covers only the hips and buttocks. It may be used for either healing or hygiene  purposes. Sitz baths are also used to relieve pain, itching, or muscle spasms. The water may contain medicine. Moist heat will help you heal and relax.  HOME CARE INSTRUCTIONS  Take 3 to 4 sitz baths a day. 1. Fill the bathtub half full with warm water. 2. Sit in the water and open the drain a little. 3. Turn on the warm water to keep the tub half full. Keep the water running constantly. 4. Soak in the water for 15 to 20 minutes. 5. After the sitz bath, pat the affected area dry first.   4. KEEP YOUR BOWELS REGULAR a. The goal is one bowel movement a day b. Avoid getting constipated.  Between the surgery and the pain medications, it is common to experience some constipation.  Increasing fluid intake and taking a fiber supplement (such as Metamucil, Citrucel, FiberCon, MiraLax, etc) 2-3 times a day regularly will usually help prevent this problem from occurring.  A mild laxative (prune juice, Milk of Magnesia, MiraLax, etc) should be taken according to package directions if there are no bowel movements after 48 hours. c. Watch out for diarrhea.  If you have many loose bowel movements, simplify your diet to  bland foods & liquids for a few days.  Stop any stool softeners and decrease your fiber supplement.  Switching to mild anti-diarrheal medications (Kayopectate, Pepto Bismol) can help.  If this worsens or does not improve, please call us.  5. Wound Care  a. Remove your bandages with your first bowel movement, usually the day after surgery.  You may have packing if you had an abscess.  Let any packing or gauze fall come out.   b. Wear an absorbent pad or soft cotton balls in your underwear as needed to catch any drainage and help keep the area  c. Keep the area clean and dry.  Bathe / shower every day.  Keep the area clean by showering / bathing over the incision / wound.   It is okay to soak an open wound to help wash it.  Consider using a squeeze bottle filled with warm water to gently wash the  anal area.  Wet wipes or showers / gentle washing after bowel movements is often less traumatic than regular toilet paper. d. Dennis Bast will often notice bleeding with bowel movements.  This should slow down by the end of the first week of surgery.  Sitting on an ice pack can help. e. Expect some drainage.  This should slow down by the end of the first week of surgery, but you will have occasional bleeding or drainage up to a few months after surgery.  Wear an absorbent pad or soft cotton gauze in your underwear until the drainage stops.  6. ACTIVITIES as tolerated:   a. You may resume regular (light) daily activities beginning the next day--such as daily self-care, walking, climbing stairs--gradually increasing activities as tolerated.  If you can walk 30 minutes without difficulty, it is safe to try more intense activity such as jogging, treadmill, bicycling, low-impact aerobics, swimming, etc. b. Save the most intensive and strenuous activity for last such as sit-ups, heavy lifting, contact sports, etc  Refrain from any heavy lifting or straining until you are off narcotics for pain control.   c. DO NOT PUSH THROUGH PAIN.  Let pain be your guide: If it hurts to do something, don't do it.  Pain is your body warning you to avoid that activity for another week until the pain goes down. d. You may drive when you are no longer taking prescription pain medication, you can comfortably sit for long periods of time, and you can safely maneuver your car and apply brakes. e. Dennis Bast may have sexual intercourse when it is comfortable.  7. FOLLOW UP in our office a. Please call [facility] at [phone] to set up an appointment to see your surgeon in the office for a follow-up appointment approximately 2-3 weeks after your surgery. b. Make sure that you call for this appointment the day you arrive home to ensure a convenient appointment time.  8. IF YOU HAVE DISABILITY OR FAMILY LEAVE FORMS, BRING THEM TO THE OFFICE FOR  PROCESSING.  DO NOT GIVE THEM TO YOUR DOCTOR.        WHEN TO CALL us [phone]: 1. Poor pain control 2. Reactions / problems with new medications (rash/itching, nausea, etc)  3. Fever over 101.5 F (38.5 C) 4. Inability to urinate 5. Nausea and/or vomiting 6. Worsening swelling or bruising 7. Continued bleeding from incision. 8. Increased pain, redness, or drainage from the incision  The clinic staff is available to answer your questions during regular business hours (8:30am-5pm).  Please dont hesitate to call and  ask to speak to one of our nurses for clinical concerns.   A surgeon from [facility] is always on call at the hospitals   If you have a medical emergency, go to the nearest emergency room or call 911.    [facility], [place], [place], [place], [place]  [address] ? MAIN: [phone] ? TOLL FREE: [phone] ? FAX [phone] [email or web address]    HEMORRHOIDS  The rectum is the last foot of your colon, and it naturally stretches to hold stool.  Hemorrhoidal piles are natural clusters of blood vessels that help the rectum and anal canal stretch to hold stool and allow bowel movements to eliminate feces.   Hemorrhoids are abnormally swollen blood vessels in the rectum.  Too much pressure in the rectum causes hemorrhoids by forcing blood to stretch and bulge the walls of the veins, sometimes even rupturing them.  Hemorrhoids can become like varicose veins you might see on a person's legs.  Most people will develop a flare of hemorrhoids in their lifetime.  When bulging hemorrhoidal veins are irritated, they can swell, burn, itch, cause pain, and bleed.  Most flares will calm down gradually own within a few weeks.  However, once hemorrhoids are created, they are difficult to get rid of completely and tend to flare more easily than the first flare.   Fortunately, good habits and simple medical treatment usually  control hemorrhoids well, and surgery is needed only in severe cases. Types of Hemorrhoids:  Internal hemorrhoids usually don't initially hurt or itch; they are deep inside the rectum and usually have no sensation. If they begin to push out (prolapse), pain and burning can occur.  However, internal hemorrhoids can bleed.  Anal bleeding should not be ignored since bleeding could come from a dangerous source like colorectal cancer, so persistent rectal bleeding should be investigated by a doctor, sometimes with a colonoscopy.  External hemorrhoids cause most of the symptoms - pain, burning, and itching. Nonirritated hemorrhoids can look like small skin tags coming out of the anus.   Thrombosed hemorrhoids can form when a hemorrhoid blood vessel bursts and causes the hemorrhoid to suddenly swell.  A purple blood clot can form in it and become an excruciatingly painful lump at the anus. Because of these unpleasant symptoms, immediate incision and drainage by a surgeon at an office visit can provide much relief of the pain.    PREVENTION Avoiding the most frequent causes listed below will prevent most cases of hemorrhoids: Constipation Hard stools Diarrhea  Constant sitting  Straining with bowel movements Sitting on the toilet for a long time  Severe coughing  episodes Pregnancy / Childbirth  Heavy Lifting  Sometimes avoiding the above triggers is difficult:  How can you avoid sitting all day if you have a seated job? Also, we try to avoid coughing and diarrhea, but sometimes its beyond your control.  Still, there are some practical hints to help: Keep the anal and genital area clean.  Moistened tissues such as flushable wet wipes are less irritating than toilet paper.  Using irrigating showers or bottle irrigation washing gently cleans this sensitive area.   Avoid dry toilet paper when cleaning after bowel movements.  Marland Kitchen Keep the anal and genital area dry.  Lightly pat the rectal area dry.  Avoid  rubbing.  Talcum or baby powders can help GET YOUR STOOLS SOFT.   This is the most important way to prevent irritated hemorrhoids.  Hard stools  are like sandpaper to the anorectal canal and will cause more problems.  The goal: ONE SOFT BOWEL MOVEMENT A DAY!  BMs from every other day to 3 times a day is a tolerable range Treat coughing, diarrhea and constipation early since irritated hemorrhoids may soon follow.  If your main job activity is seated, always stand or walk during your breaks. Make it a point to stand and walk at least 5 minutes every hour and try to shift frequently in your chair to avoid direct rectal pressure.  Always exhale as you strain or lift. Don't hold your breath.  Do not delay or try to prevent a bowel movement when the urge is present. Exercise regularly (walking or jogging 60 minutes a day) to stimulate the bowels to move. No reading or other activity while on the toilet. If bowel movements take longer than 5 minutes, you are too constipated. AVOID CONSTIPATION Drink plenty of liquids (1 1/2 to 2 quarts of water and other fluids a day unless fluid restricted for another medical condition). Liquids that contain caffeine (coffee a, tea, soft drinks) can be dehydrating and should be avoided until constipation is controlled. Consider minimizing milk, as dairy products may be constipating. Eat plenty of fiber (30g a day ideal, more if needed).  Fiber is the undigested part of plant food that passes into the colon, acting as natures broom to encourage bowel motility and movement.  Fiber can absorb and hold large amounts of water. This results in a larger, bulkier stool, which is soft and easier to pass.  Eating foods high in fiber - 12 servings - such as  Vegetables: Root (potatoes, carrots, turnips), Leafy green (lettuce, salad greens, celery, spinach), High residue (cabbage, broccoli, etc.) Fruit: Fresh, Dried (prunes, apricots, cherries), Stewed (applesauce)  Whole grain breads,  pasta, whole wheat Bran cereals, muffins, etc. Consider adding supplemental bulking fiber which retains large volumes of water: Psyllium ground seeds (native plant from [place])--available as Metamucil, Konsyl, Effersyllium, Per Diem Fiber, or the less expensive generic forms.  Citrucel  (methylcellulose wood fiber) . FiberCon (Polycarbophil) Polyethylene Glycol - and artificial fiber commonly called Miralax or Glycolax.  It is helpful for people with gassy or bloated feelings with regular fiber Flax Seed - a less gassy natural fiber  Laxatives can be useful for a short period if constipation is severe Osmotics (Milk of Magnesia, Fleets Phospho-Soda, Magnesium Citrate)  Stimulants (Senokot,   Castor Oil,  Dulcolax, Ex-Lax)    Laxatives are not a good long-term solution as it can stress the bowels and cause too much mineral loss and dehydration.   Avoid taking laxatives for more than 7 days in a row.  AVOID DIARRHEA Switch to liquids and simpler foods for a few days to avoid stressing your intestines further. Avoid dairy products (especially milk & ice cream) for a short time.  The intestines often can lose the ability to digest lactose when stressed. Avoid foods that cause gassiness or bloating.  Typical foods include beans and other legumes, cabbage, broccoli, and dairy foods.  Every person has some sensitivity to other foods, so listen to your body and avoid those foods that trigger problems for you. Adding fiber (Citrucel, Metamucil, FiberCon, Flax seed, Miralax) gradually can help thicken stools by absorbing excess fluid and retrain the intestines to act more normally.  Slowly increase the dose over a few weeks.  Too much fiber too soon can backfire and cause cramping & bloating. Probiotics (such as active yogurt, Align, etc)  may help repopulate the intestines and colon with normal bacteria and calm down a sensitive digestive tract.  Most studies show it to be of mild help, though, and  such products can be costly. Medicines: Bismuth subsalicylate (ex. Kayopectate, Pepto Bismol) every 30 minutes for up to 6 doses can help control diarrhea.  Avoid if pregnant. Loperamide (Immodium) can slow down diarrhea.  Start with two tablets (4mg  total) first and then try one tablet every 6 hours.  Avoid if you are having fevers or severe pain.  If you are not better or start feeling worse, stop all medicines and call your doctor for advice Call your doctor if you are getting worse or not better.  Sometimes further testing (cultures, endoscopy, X-ray studies, bloodwork, etc) may be needed to help diagnose and treat the cause of the diarrhea.  TROUBLESHOOTING IRREGULAR BOWELS 1) Avoid extremes of bowel movements (no bad constipation/diarrhea) 2) Miralax 17gm mixed in 8oz. water or juice-daily. May use BID as needed.  3) Gas-x,Phazyme, etc. as needed for gas & bloating.  4) Soft,bland diet. No spicy,greasy,fried foods.  5) Prilosec over-the-counter as needed  6) May hold gluten/wheat products from diet to see if symptoms improve.  7)  May try probiotics (Align, Activa, etc) to help calm the bowels down 7) If symptoms become worse call back immediately.   TREATMENT OF HEMORRHOID FLARE If these preventive measures fail, you must take action right away! Hemorrhoids are one condition that can be mild in the morning and become intolerable by nightfall. Most hemorrhoidal flares take several weeks to calm down.  These suggestions can help: Warm soaks.  This helps more than any topical medication.  Use up to 8 times a day.  Usually sitz baths or sitting in a warm bathtub helps.  Sitting on moist warm towels are helpful.  Switching to ice packs/cool compresses can be helpful  Use a Sitz Bath 4-8 times a day for relief A sitz bath is a warm water bath taken in the sitting position that covers only the hips and buttocks. It may be used for either healing or hygiene purposes. Sitz baths are also used to  relieve pain, itching, or muscle spasms. The water may contain medicine. Moist heat will help you heal and relax.  HOME CARE INSTRUCTIONS  Take 3 to 4 sitz baths a day. 6. Fill the bathtub half full with warm water. 7. Sit in the water and open the drain a little. 8. Turn on the warm water to keep the tub half full. Keep the water running constantly. 9. Soak in the water for 15 to 20 minutes. 10. After the sitz bath, pat the affected area dry first. SEEK MEDICAL CARE IF:  You get worse instead of better. Stop the sitz baths if you get worse.  Normalize your bowels.  Extremes of diarrhea or constipation will make hemorrhoids worse.  One soft bowel movement a day is the goal.  Fiber can help get your bowels regular Wet wipes instead of toilet paper Pain control with a NSAID such as ibuprofen (Advil) or naproxen (Aleve) or acetaminophen (Tylenol) around the clock.  Narcotics are constipating and should be minimized if possible Topical creams contain steroids (bydrocortisone) or local anesthetic (xylocaine) can help make pain and itching more tolerable.   EVALUATION If hemorrhoids are still causing problems, you could benefit by an evaluation by a surgeon.  The surgeon will obtain a history and examine you.  If hemorrhoids are diagnosed, some therapies can be offered  in the office, usually with an anoscope into the less sensitive area of the rectum: -injection of hemorrhoids (sclerotherapy) can scar the blood vessels of the swollen/enlarged hemorrhoids to help shrink them down to a more normal size -rubber banding of the enlarged hemorrhoids to help shrink them down to a more normal size -drainage of the blood clot causing a thrombosed hemorrhoid,  to relieve the severe pain   While 90% of the time such problems from hemorrhoids can be managed without preceding to surgery, sometimes the hemorrhoids require a operation to control the problem (uncontrolled bleeding, prolapse, pain, etc.).   This  involves being placed under general anesthesia where the surgeon can confirm the diagnosis and remove, suture, or staple the hemorrhoid(s).  Your surgeon can help you treat the problem appropriately.

## 2018-02-27 ENCOUNTER — Encounter (HOSPITAL_COMMUNITY): Payer: Self-pay | Admitting: Surgery

## 2018-03-09 DIAGNOSIS — Z992 Dependence on renal dialysis: Secondary | ICD-10-CM | POA: Diagnosis not present

## 2018-03-09 DIAGNOSIS — N008 Acute nephritic syndrome with other morphologic changes: Secondary | ICD-10-CM | POA: Diagnosis not present

## 2018-03-09 DIAGNOSIS — D631 Anemia in chronic kidney disease: Secondary | ICD-10-CM | POA: Diagnosis not present

## 2018-03-09 DIAGNOSIS — N2581 Secondary hyperparathyroidism of renal origin: Secondary | ICD-10-CM | POA: Diagnosis not present

## 2018-03-09 DIAGNOSIS — N186 End stage renal disease: Secondary | ICD-10-CM | POA: Diagnosis not present

## 2018-03-09 NOTE — Anesthesia Postprocedure Evaluation (Signed)
Anesthesia Post Note  Patient: Travis [name]  Procedure(s) Performed: ANORECTAL EXAM UNDER ANESTHESIA  HEMORRHOIDECTOMY x 2  HEMORRHOIDAL LIGATION AND PEXY (N/A )     Patient location during evaluation: PACU Anesthesia Type: General Level of consciousness: awake and alert Pain management: pain level controlled Vital Signs Assessment: post-procedure vital signs reviewed and stable Respiratory status: spontaneous breathing, nonlabored ventilation, respiratory function stable and patient connected to nasal cannula oxygen Cardiovascular status: blood pressure returned to baseline and stable Postop Assessment: no apparent nausea or vomiting Anesthetic complications: no    Last Vitals:  Vitals:   02/26/18 2000 02/26/18 2021  BP:  (!) 166/99  Pulse: 62 66  Resp:  18  Temp:  36.4 C  SpO2: 100% 99%    Last Pain:  Vitals:   02/26/18 2021  TempSrc:   PainSc: 0-No pain   Pain Goal: Patients Stated Pain Goal: 4 (02/26/18 1845)               Riccardo Dubin

## 2018-03-11 DIAGNOSIS — D631 Anemia in chronic kidney disease: Secondary | ICD-10-CM | POA: Diagnosis not present

## 2018-03-11 DIAGNOSIS — N2581 Secondary hyperparathyroidism of renal origin: Secondary | ICD-10-CM | POA: Diagnosis not present

## 2018-03-11 DIAGNOSIS — N186 End stage renal disease: Secondary | ICD-10-CM | POA: Diagnosis not present

## 2018-03-13 DIAGNOSIS — N186 End stage renal disease: Secondary | ICD-10-CM | POA: Diagnosis not present

## 2018-03-13 DIAGNOSIS — D631 Anemia in chronic kidney disease: Secondary | ICD-10-CM | POA: Diagnosis not present

## 2018-03-13 DIAGNOSIS — N2581 Secondary hyperparathyroidism of renal origin: Secondary | ICD-10-CM | POA: Diagnosis not present

## 2018-03-16 DIAGNOSIS — N186 End stage renal disease: Secondary | ICD-10-CM | POA: Diagnosis not present

## 2018-03-16 DIAGNOSIS — N2581 Secondary hyperparathyroidism of renal origin: Secondary | ICD-10-CM | POA: Diagnosis not present

## 2018-03-16 DIAGNOSIS — D631 Anemia in chronic kidney disease: Secondary | ICD-10-CM | POA: Diagnosis not present

## 2018-03-18 DIAGNOSIS — D631 Anemia in chronic kidney disease: Secondary | ICD-10-CM | POA: Diagnosis not present

## 2018-03-18 DIAGNOSIS — Z7682 Awaiting organ transplant status: Secondary | ICD-10-CM | POA: Diagnosis not present

## 2018-03-18 DIAGNOSIS — N186 End stage renal disease: Secondary | ICD-10-CM | POA: Diagnosis not present

## 2018-03-18 DIAGNOSIS — N2581 Secondary hyperparathyroidism of renal origin: Secondary | ICD-10-CM | POA: Diagnosis not present

## 2018-03-18 DIAGNOSIS — Z01818 Encounter for other preprocedural examination: Secondary | ICD-10-CM | POA: Diagnosis not present

## 2018-03-20 DIAGNOSIS — N2581 Secondary hyperparathyroidism of renal origin: Secondary | ICD-10-CM | POA: Diagnosis not present

## 2018-03-20 DIAGNOSIS — N186 End stage renal disease: Secondary | ICD-10-CM | POA: Diagnosis not present

## 2018-03-20 DIAGNOSIS — D631 Anemia in chronic kidney disease: Secondary | ICD-10-CM | POA: Diagnosis not present

## 2018-03-23 DIAGNOSIS — N186 End stage renal disease: Secondary | ICD-10-CM | POA: Diagnosis not present

## 2018-03-23 DIAGNOSIS — N2581 Secondary hyperparathyroidism of renal origin: Secondary | ICD-10-CM | POA: Diagnosis not present

## 2018-03-23 DIAGNOSIS — D631 Anemia in chronic kidney disease: Secondary | ICD-10-CM | POA: Diagnosis not present

## 2018-03-25 DIAGNOSIS — D631 Anemia in chronic kidney disease: Secondary | ICD-10-CM | POA: Diagnosis not present

## 2018-03-25 DIAGNOSIS — N2581 Secondary hyperparathyroidism of renal origin: Secondary | ICD-10-CM | POA: Diagnosis not present

## 2018-03-25 DIAGNOSIS — N186 End stage renal disease: Secondary | ICD-10-CM | POA: Diagnosis not present

## 2018-03-27 DIAGNOSIS — N2581 Secondary hyperparathyroidism of renal origin: Secondary | ICD-10-CM | POA: Diagnosis not present

## 2018-03-27 DIAGNOSIS — D631 Anemia in chronic kidney disease: Secondary | ICD-10-CM | POA: Diagnosis not present

## 2018-03-27 DIAGNOSIS — N186 End stage renal disease: Secondary | ICD-10-CM | POA: Diagnosis not present

## 2018-03-30 DIAGNOSIS — N2581 Secondary hyperparathyroidism of renal origin: Secondary | ICD-10-CM | POA: Diagnosis not present

## 2018-03-30 DIAGNOSIS — D631 Anemia in chronic kidney disease: Secondary | ICD-10-CM | POA: Diagnosis not present

## 2018-03-30 DIAGNOSIS — N186 End stage renal disease: Secondary | ICD-10-CM | POA: Diagnosis not present

## 2018-04-01 DIAGNOSIS — N2581 Secondary hyperparathyroidism of renal origin: Secondary | ICD-10-CM | POA: Diagnosis not present

## 2018-04-01 DIAGNOSIS — N186 End stage renal disease: Secondary | ICD-10-CM | POA: Diagnosis not present

## 2018-04-01 DIAGNOSIS — D631 Anemia in chronic kidney disease: Secondary | ICD-10-CM | POA: Diagnosis not present

## 2018-04-03 DIAGNOSIS — D631 Anemia in chronic kidney disease: Secondary | ICD-10-CM | POA: Diagnosis not present

## 2018-04-03 DIAGNOSIS — N2581 Secondary hyperparathyroidism of renal origin: Secondary | ICD-10-CM | POA: Diagnosis not present

## 2018-04-03 DIAGNOSIS — N186 End stage renal disease: Secondary | ICD-10-CM | POA: Diagnosis not present

## 2018-04-06 DIAGNOSIS — N186 End stage renal disease: Secondary | ICD-10-CM | POA: Diagnosis not present

## 2018-04-06 DIAGNOSIS — N2581 Secondary hyperparathyroidism of renal origin: Secondary | ICD-10-CM | POA: Diagnosis not present

## 2018-04-06 DIAGNOSIS — D631 Anemia in chronic kidney disease: Secondary | ICD-10-CM | POA: Diagnosis not present

## 2018-04-08 DIAGNOSIS — N2581 Secondary hyperparathyroidism of renal origin: Secondary | ICD-10-CM | POA: Diagnosis not present

## 2018-04-08 DIAGNOSIS — N008 Acute nephritic syndrome with other morphologic changes: Secondary | ICD-10-CM | POA: Diagnosis not present

## 2018-04-08 DIAGNOSIS — D631 Anemia in chronic kidney disease: Secondary | ICD-10-CM | POA: Diagnosis not present

## 2018-04-08 DIAGNOSIS — N186 End stage renal disease: Secondary | ICD-10-CM | POA: Diagnosis not present

## 2018-04-08 DIAGNOSIS — Z992 Dependence on renal dialysis: Secondary | ICD-10-CM | POA: Diagnosis not present

## 2018-04-10 DIAGNOSIS — D631 Anemia in chronic kidney disease: Secondary | ICD-10-CM | POA: Diagnosis not present

## 2018-04-10 DIAGNOSIS — N186 End stage renal disease: Secondary | ICD-10-CM | POA: Diagnosis not present

## 2018-04-10 DIAGNOSIS — N2581 Secondary hyperparathyroidism of renal origin: Secondary | ICD-10-CM | POA: Diagnosis not present

## 2018-04-13 DIAGNOSIS — N2581 Secondary hyperparathyroidism of renal origin: Secondary | ICD-10-CM | POA: Diagnosis not present

## 2018-04-13 DIAGNOSIS — D631 Anemia in chronic kidney disease: Secondary | ICD-10-CM | POA: Diagnosis not present

## 2018-04-13 DIAGNOSIS — N186 End stage renal disease: Secondary | ICD-10-CM | POA: Diagnosis not present

## 2018-04-15 DIAGNOSIS — D631 Anemia in chronic kidney disease: Secondary | ICD-10-CM | POA: Diagnosis not present

## 2018-04-15 DIAGNOSIS — N2581 Secondary hyperparathyroidism of renal origin: Secondary | ICD-10-CM | POA: Diagnosis not present

## 2018-04-15 DIAGNOSIS — N186 End stage renal disease: Secondary | ICD-10-CM | POA: Diagnosis not present

## 2018-04-17 DIAGNOSIS — N186 End stage renal disease: Secondary | ICD-10-CM | POA: Diagnosis not present

## 2018-04-17 DIAGNOSIS — D631 Anemia in chronic kidney disease: Secondary | ICD-10-CM | POA: Diagnosis not present

## 2018-04-17 DIAGNOSIS — N2581 Secondary hyperparathyroidism of renal origin: Secondary | ICD-10-CM | POA: Diagnosis not present

## 2018-04-20 DIAGNOSIS — D631 Anemia in chronic kidney disease: Secondary | ICD-10-CM | POA: Diagnosis not present

## 2018-04-20 DIAGNOSIS — N2581 Secondary hyperparathyroidism of renal origin: Secondary | ICD-10-CM | POA: Diagnosis not present

## 2018-04-20 DIAGNOSIS — N186 End stage renal disease: Secondary | ICD-10-CM | POA: Diagnosis not present

## 2018-04-22 DIAGNOSIS — D631 Anemia in chronic kidney disease: Secondary | ICD-10-CM | POA: Diagnosis not present

## 2018-04-22 DIAGNOSIS — N186 End stage renal disease: Secondary | ICD-10-CM | POA: Diagnosis not present

## 2018-04-22 DIAGNOSIS — N2581 Secondary hyperparathyroidism of renal origin: Secondary | ICD-10-CM | POA: Diagnosis not present

## 2018-04-24 DIAGNOSIS — N186 End stage renal disease: Secondary | ICD-10-CM | POA: Diagnosis not present

## 2018-04-24 DIAGNOSIS — N2581 Secondary hyperparathyroidism of renal origin: Secondary | ICD-10-CM | POA: Diagnosis not present

## 2018-04-24 DIAGNOSIS — D631 Anemia in chronic kidney disease: Secondary | ICD-10-CM | POA: Diagnosis not present

## 2018-04-27 DIAGNOSIS — N2581 Secondary hyperparathyroidism of renal origin: Secondary | ICD-10-CM | POA: Diagnosis not present

## 2018-04-27 DIAGNOSIS — N186 End stage renal disease: Secondary | ICD-10-CM | POA: Diagnosis not present

## 2018-04-27 DIAGNOSIS — D631 Anemia in chronic kidney disease: Secondary | ICD-10-CM | POA: Diagnosis not present

## 2018-04-29 DIAGNOSIS — D631 Anemia in chronic kidney disease: Secondary | ICD-10-CM | POA: Diagnosis not present

## 2018-04-29 DIAGNOSIS — N186 End stage renal disease: Secondary | ICD-10-CM | POA: Diagnosis not present

## 2018-04-29 DIAGNOSIS — N2581 Secondary hyperparathyroidism of renal origin: Secondary | ICD-10-CM | POA: Diagnosis not present

## 2018-05-01 DIAGNOSIS — D631 Anemia in chronic kidney disease: Secondary | ICD-10-CM | POA: Diagnosis not present

## 2018-05-01 DIAGNOSIS — N2581 Secondary hyperparathyroidism of renal origin: Secondary | ICD-10-CM | POA: Diagnosis not present

## 2018-05-01 DIAGNOSIS — N186 End stage renal disease: Secondary | ICD-10-CM | POA: Diagnosis not present

## 2018-05-04 DIAGNOSIS — D631 Anemia in chronic kidney disease: Secondary | ICD-10-CM | POA: Diagnosis not present

## 2018-05-04 DIAGNOSIS — N2581 Secondary hyperparathyroidism of renal origin: Secondary | ICD-10-CM | POA: Diagnosis not present

## 2018-05-04 DIAGNOSIS — N186 End stage renal disease: Secondary | ICD-10-CM | POA: Diagnosis not present

## 2018-05-06 DIAGNOSIS — N2581 Secondary hyperparathyroidism of renal origin: Secondary | ICD-10-CM | POA: Diagnosis not present

## 2018-05-06 DIAGNOSIS — D631 Anemia in chronic kidney disease: Secondary | ICD-10-CM | POA: Diagnosis not present

## 2018-05-06 DIAGNOSIS — N186 End stage renal disease: Secondary | ICD-10-CM | POA: Diagnosis not present

## 2018-05-08 DIAGNOSIS — N186 End stage renal disease: Secondary | ICD-10-CM | POA: Diagnosis not present

## 2018-05-08 DIAGNOSIS — N2581 Secondary hyperparathyroidism of renal origin: Secondary | ICD-10-CM | POA: Diagnosis not present

## 2018-05-08 DIAGNOSIS — D631 Anemia in chronic kidney disease: Secondary | ICD-10-CM | POA: Diagnosis not present

## 2018-05-09 DIAGNOSIS — N008 Acute nephritic syndrome with other morphologic changes: Secondary | ICD-10-CM | POA: Diagnosis not present

## 2018-05-09 DIAGNOSIS — Z992 Dependence on renal dialysis: Secondary | ICD-10-CM | POA: Diagnosis not present

## 2018-05-09 DIAGNOSIS — N186 End stage renal disease: Secondary | ICD-10-CM | POA: Diagnosis not present

## 2018-05-11 DIAGNOSIS — N186 End stage renal disease: Secondary | ICD-10-CM | POA: Diagnosis not present

## 2018-05-11 DIAGNOSIS — N2581 Secondary hyperparathyroidism of renal origin: Secondary | ICD-10-CM | POA: Diagnosis not present

## 2018-05-11 DIAGNOSIS — D631 Anemia in chronic kidney disease: Secondary | ICD-10-CM | POA: Diagnosis not present

## 2018-05-13 DIAGNOSIS — D631 Anemia in chronic kidney disease: Secondary | ICD-10-CM | POA: Diagnosis not present

## 2018-05-13 DIAGNOSIS — N186 End stage renal disease: Secondary | ICD-10-CM | POA: Diagnosis not present

## 2018-05-13 DIAGNOSIS — N2581 Secondary hyperparathyroidism of renal origin: Secondary | ICD-10-CM | POA: Diagnosis not present

## 2018-05-15 DIAGNOSIS — N2581 Secondary hyperparathyroidism of renal origin: Secondary | ICD-10-CM | POA: Diagnosis not present

## 2018-05-15 DIAGNOSIS — D631 Anemia in chronic kidney disease: Secondary | ICD-10-CM | POA: Diagnosis not present

## 2018-05-15 DIAGNOSIS — N186 End stage renal disease: Secondary | ICD-10-CM | POA: Diagnosis not present

## 2018-05-18 DIAGNOSIS — N186 End stage renal disease: Secondary | ICD-10-CM | POA: Diagnosis not present

## 2018-05-18 DIAGNOSIS — N2581 Secondary hyperparathyroidism of renal origin: Secondary | ICD-10-CM | POA: Diagnosis not present

## 2018-05-18 DIAGNOSIS — D631 Anemia in chronic kidney disease: Secondary | ICD-10-CM | POA: Diagnosis not present

## 2018-05-20 DIAGNOSIS — D631 Anemia in chronic kidney disease: Secondary | ICD-10-CM | POA: Diagnosis not present

## 2018-05-20 DIAGNOSIS — N2581 Secondary hyperparathyroidism of renal origin: Secondary | ICD-10-CM | POA: Diagnosis not present

## 2018-05-20 DIAGNOSIS — N186 End stage renal disease: Secondary | ICD-10-CM | POA: Diagnosis not present

## 2018-05-22 DIAGNOSIS — D631 Anemia in chronic kidney disease: Secondary | ICD-10-CM | POA: Diagnosis not present

## 2018-05-22 DIAGNOSIS — N186 End stage renal disease: Secondary | ICD-10-CM | POA: Diagnosis not present

## 2018-05-22 DIAGNOSIS — N2581 Secondary hyperparathyroidism of renal origin: Secondary | ICD-10-CM | POA: Diagnosis not present

## 2018-05-25 DIAGNOSIS — N2581 Secondary hyperparathyroidism of renal origin: Secondary | ICD-10-CM | POA: Diagnosis not present

## 2018-05-25 DIAGNOSIS — D631 Anemia in chronic kidney disease: Secondary | ICD-10-CM | POA: Diagnosis not present

## 2018-05-25 DIAGNOSIS — N186 End stage renal disease: Secondary | ICD-10-CM | POA: Diagnosis not present

## 2018-05-27 DIAGNOSIS — D631 Anemia in chronic kidney disease: Secondary | ICD-10-CM | POA: Diagnosis not present

## 2018-05-27 DIAGNOSIS — N186 End stage renal disease: Secondary | ICD-10-CM | POA: Diagnosis not present

## 2018-05-27 DIAGNOSIS — N2581 Secondary hyperparathyroidism of renal origin: Secondary | ICD-10-CM | POA: Diagnosis not present

## 2018-05-29 DIAGNOSIS — D631 Anemia in chronic kidney disease: Secondary | ICD-10-CM | POA: Diagnosis not present

## 2018-05-29 DIAGNOSIS — N2581 Secondary hyperparathyroidism of renal origin: Secondary | ICD-10-CM | POA: Diagnosis not present

## 2018-05-29 DIAGNOSIS — N186 End stage renal disease: Secondary | ICD-10-CM | POA: Diagnosis not present

## 2018-06-01 DIAGNOSIS — N2581 Secondary hyperparathyroidism of renal origin: Secondary | ICD-10-CM | POA: Diagnosis not present

## 2018-06-01 DIAGNOSIS — N186 End stage renal disease: Secondary | ICD-10-CM | POA: Diagnosis not present

## 2018-06-01 DIAGNOSIS — D631 Anemia in chronic kidney disease: Secondary | ICD-10-CM | POA: Diagnosis not present

## 2018-06-03 DIAGNOSIS — N2581 Secondary hyperparathyroidism of renal origin: Secondary | ICD-10-CM | POA: Diagnosis not present

## 2018-06-03 DIAGNOSIS — D631 Anemia in chronic kidney disease: Secondary | ICD-10-CM | POA: Diagnosis not present

## 2018-06-03 DIAGNOSIS — N186 End stage renal disease: Secondary | ICD-10-CM | POA: Diagnosis not present

## 2018-06-05 DIAGNOSIS — N186 End stage renal disease: Secondary | ICD-10-CM | POA: Diagnosis not present

## 2018-06-05 DIAGNOSIS — D631 Anemia in chronic kidney disease: Secondary | ICD-10-CM | POA: Diagnosis not present

## 2018-06-05 DIAGNOSIS — N2581 Secondary hyperparathyroidism of renal origin: Secondary | ICD-10-CM | POA: Diagnosis not present

## 2018-06-08 DIAGNOSIS — N2581 Secondary hyperparathyroidism of renal origin: Secondary | ICD-10-CM | POA: Diagnosis not present

## 2018-06-08 DIAGNOSIS — N186 End stage renal disease: Secondary | ICD-10-CM | POA: Diagnosis not present

## 2018-06-08 DIAGNOSIS — D631 Anemia in chronic kidney disease: Secondary | ICD-10-CM | POA: Diagnosis not present

## 2018-06-08 DIAGNOSIS — Z992 Dependence on renal dialysis: Secondary | ICD-10-CM | POA: Diagnosis not present

## 2018-06-08 DIAGNOSIS — N008 Acute nephritic syndrome with other morphologic changes: Secondary | ICD-10-CM | POA: Diagnosis not present

## 2018-06-10 DIAGNOSIS — D631 Anemia in chronic kidney disease: Secondary | ICD-10-CM | POA: Diagnosis not present

## 2018-06-10 DIAGNOSIS — N186 End stage renal disease: Secondary | ICD-10-CM | POA: Diagnosis not present

## 2018-06-10 DIAGNOSIS — N2581 Secondary hyperparathyroidism of renal origin: Secondary | ICD-10-CM | POA: Diagnosis not present

## 2018-06-12 DIAGNOSIS — N186 End stage renal disease: Secondary | ICD-10-CM | POA: Diagnosis not present

## 2018-06-12 DIAGNOSIS — D631 Anemia in chronic kidney disease: Secondary | ICD-10-CM | POA: Diagnosis not present

## 2018-06-12 DIAGNOSIS — N2581 Secondary hyperparathyroidism of renal origin: Secondary | ICD-10-CM | POA: Diagnosis not present

## 2018-06-15 DIAGNOSIS — D631 Anemia in chronic kidney disease: Secondary | ICD-10-CM | POA: Diagnosis not present

## 2018-06-15 DIAGNOSIS — N186 End stage renal disease: Secondary | ICD-10-CM | POA: Diagnosis not present

## 2018-06-15 DIAGNOSIS — N2581 Secondary hyperparathyroidism of renal origin: Secondary | ICD-10-CM | POA: Diagnosis not present

## 2018-06-17 DIAGNOSIS — D631 Anemia in chronic kidney disease: Secondary | ICD-10-CM | POA: Diagnosis not present

## 2018-06-17 DIAGNOSIS — N186 End stage renal disease: Secondary | ICD-10-CM | POA: Diagnosis not present

## 2018-06-17 DIAGNOSIS — N2581 Secondary hyperparathyroidism of renal origin: Secondary | ICD-10-CM | POA: Diagnosis not present

## 2018-06-19 DIAGNOSIS — N2581 Secondary hyperparathyroidism of renal origin: Secondary | ICD-10-CM | POA: Diagnosis not present

## 2018-06-19 DIAGNOSIS — N186 End stage renal disease: Secondary | ICD-10-CM | POA: Diagnosis not present

## 2018-06-19 DIAGNOSIS — D631 Anemia in chronic kidney disease: Secondary | ICD-10-CM | POA: Diagnosis not present

## 2018-06-22 DIAGNOSIS — N186 End stage renal disease: Secondary | ICD-10-CM | POA: Diagnosis not present

## 2018-06-22 DIAGNOSIS — N2581 Secondary hyperparathyroidism of renal origin: Secondary | ICD-10-CM | POA: Diagnosis not present

## 2018-06-22 DIAGNOSIS — D631 Anemia in chronic kidney disease: Secondary | ICD-10-CM | POA: Diagnosis not present

## 2018-06-24 DIAGNOSIS — N2581 Secondary hyperparathyroidism of renal origin: Secondary | ICD-10-CM | POA: Diagnosis not present

## 2018-06-24 DIAGNOSIS — D631 Anemia in chronic kidney disease: Secondary | ICD-10-CM | POA: Diagnosis not present

## 2018-06-24 DIAGNOSIS — N186 End stage renal disease: Secondary | ICD-10-CM | POA: Diagnosis not present

## 2018-06-26 DIAGNOSIS — N2581 Secondary hyperparathyroidism of renal origin: Secondary | ICD-10-CM | POA: Diagnosis not present

## 2018-06-26 DIAGNOSIS — N186 End stage renal disease: Secondary | ICD-10-CM | POA: Diagnosis not present

## 2018-06-26 DIAGNOSIS — D631 Anemia in chronic kidney disease: Secondary | ICD-10-CM | POA: Diagnosis not present

## 2018-06-29 DIAGNOSIS — D631 Anemia in chronic kidney disease: Secondary | ICD-10-CM | POA: Diagnosis not present

## 2018-06-29 DIAGNOSIS — N2581 Secondary hyperparathyroidism of renal origin: Secondary | ICD-10-CM | POA: Diagnosis not present

## 2018-06-29 DIAGNOSIS — N186 End stage renal disease: Secondary | ICD-10-CM | POA: Diagnosis not present

## 2018-06-30 DIAGNOSIS — Z905 Acquired absence of kidney: Secondary | ICD-10-CM | POA: Diagnosis not present

## 2018-06-30 DIAGNOSIS — G4733 Obstructive sleep apnea (adult) (pediatric): Secondary | ICD-10-CM | POA: Diagnosis not present

## 2018-06-30 DIAGNOSIS — N186 End stage renal disease: Secondary | ICD-10-CM | POA: Diagnosis not present

## 2018-06-30 DIAGNOSIS — Z9989 Dependence on other enabling machines and devices: Secondary | ICD-10-CM | POA: Diagnosis not present

## 2018-07-01 DIAGNOSIS — D631 Anemia in chronic kidney disease: Secondary | ICD-10-CM | POA: Diagnosis not present

## 2018-07-01 DIAGNOSIS — N186 End stage renal disease: Secondary | ICD-10-CM | POA: Diagnosis not present

## 2018-07-01 DIAGNOSIS — N2581 Secondary hyperparathyroidism of renal origin: Secondary | ICD-10-CM | POA: Diagnosis not present

## 2018-07-03 DIAGNOSIS — N186 End stage renal disease: Secondary | ICD-10-CM | POA: Diagnosis not present

## 2018-07-03 DIAGNOSIS — D631 Anemia in chronic kidney disease: Secondary | ICD-10-CM | POA: Diagnosis not present

## 2018-07-03 DIAGNOSIS — N2581 Secondary hyperparathyroidism of renal origin: Secondary | ICD-10-CM | POA: Diagnosis not present

## 2018-07-06 DIAGNOSIS — N186 End stage renal disease: Secondary | ICD-10-CM | POA: Diagnosis not present

## 2018-07-06 DIAGNOSIS — N2581 Secondary hyperparathyroidism of renal origin: Secondary | ICD-10-CM | POA: Diagnosis not present

## 2018-07-06 DIAGNOSIS — D631 Anemia in chronic kidney disease: Secondary | ICD-10-CM | POA: Diagnosis not present

## 2018-07-08 DIAGNOSIS — N2581 Secondary hyperparathyroidism of renal origin: Secondary | ICD-10-CM | POA: Diagnosis not present

## 2018-07-08 DIAGNOSIS — D631 Anemia in chronic kidney disease: Secondary | ICD-10-CM | POA: Diagnosis not present

## 2018-07-08 DIAGNOSIS — N186 End stage renal disease: Secondary | ICD-10-CM | POA: Diagnosis not present

## 2018-07-09 DIAGNOSIS — N186 End stage renal disease: Secondary | ICD-10-CM | POA: Diagnosis not present

## 2018-07-09 DIAGNOSIS — N008 Acute nephritic syndrome with other morphologic changes: Secondary | ICD-10-CM | POA: Diagnosis not present

## 2018-07-09 DIAGNOSIS — Z992 Dependence on renal dialysis: Secondary | ICD-10-CM | POA: Diagnosis not present

## 2018-07-10 DIAGNOSIS — N186 End stage renal disease: Secondary | ICD-10-CM | POA: Diagnosis not present

## 2018-07-10 DIAGNOSIS — D631 Anemia in chronic kidney disease: Secondary | ICD-10-CM | POA: Diagnosis not present

## 2018-07-10 DIAGNOSIS — N2581 Secondary hyperparathyroidism of renal origin: Secondary | ICD-10-CM | POA: Diagnosis not present

## 2018-07-13 DIAGNOSIS — N186 End stage renal disease: Secondary | ICD-10-CM | POA: Diagnosis not present

## 2018-07-13 DIAGNOSIS — D631 Anemia in chronic kidney disease: Secondary | ICD-10-CM | POA: Diagnosis not present

## 2018-07-13 DIAGNOSIS — N2581 Secondary hyperparathyroidism of renal origin: Secondary | ICD-10-CM | POA: Diagnosis not present

## 2018-07-15 DIAGNOSIS — N2581 Secondary hyperparathyroidism of renal origin: Secondary | ICD-10-CM | POA: Diagnosis not present

## 2018-07-15 DIAGNOSIS — D631 Anemia in chronic kidney disease: Secondary | ICD-10-CM | POA: Diagnosis not present

## 2018-07-15 DIAGNOSIS — N186 End stage renal disease: Secondary | ICD-10-CM | POA: Diagnosis not present

## 2018-07-17 DIAGNOSIS — N186 End stage renal disease: Secondary | ICD-10-CM | POA: Diagnosis not present

## 2018-07-17 DIAGNOSIS — D631 Anemia in chronic kidney disease: Secondary | ICD-10-CM | POA: Diagnosis not present

## 2018-07-17 DIAGNOSIS — N2581 Secondary hyperparathyroidism of renal origin: Secondary | ICD-10-CM | POA: Diagnosis not present

## 2018-07-20 DIAGNOSIS — N2581 Secondary hyperparathyroidism of renal origin: Secondary | ICD-10-CM | POA: Diagnosis not present

## 2018-07-20 DIAGNOSIS — D631 Anemia in chronic kidney disease: Secondary | ICD-10-CM | POA: Diagnosis not present

## 2018-07-20 DIAGNOSIS — N186 End stage renal disease: Secondary | ICD-10-CM | POA: Diagnosis not present

## 2018-07-22 DIAGNOSIS — N186 End stage renal disease: Secondary | ICD-10-CM | POA: Diagnosis not present

## 2018-07-22 DIAGNOSIS — N2581 Secondary hyperparathyroidism of renal origin: Secondary | ICD-10-CM | POA: Diagnosis not present

## 2018-07-24 DIAGNOSIS — N2581 Secondary hyperparathyroidism of renal origin: Secondary | ICD-10-CM | POA: Diagnosis not present

## 2018-07-24 DIAGNOSIS — N186 End stage renal disease: Secondary | ICD-10-CM | POA: Diagnosis not present

## 2018-07-24 DIAGNOSIS — D631 Anemia in chronic kidney disease: Secondary | ICD-10-CM | POA: Diagnosis not present

## 2018-07-27 DIAGNOSIS — D631 Anemia in chronic kidney disease: Secondary | ICD-10-CM | POA: Diagnosis not present

## 2018-07-27 DIAGNOSIS — N2581 Secondary hyperparathyroidism of renal origin: Secondary | ICD-10-CM | POA: Diagnosis not present

## 2018-07-27 DIAGNOSIS — N186 End stage renal disease: Secondary | ICD-10-CM | POA: Diagnosis not present

## 2018-07-29 DIAGNOSIS — N186 End stage renal disease: Secondary | ICD-10-CM | POA: Diagnosis not present

## 2018-07-29 DIAGNOSIS — D631 Anemia in chronic kidney disease: Secondary | ICD-10-CM | POA: Diagnosis not present

## 2018-07-29 DIAGNOSIS — N2581 Secondary hyperparathyroidism of renal origin: Secondary | ICD-10-CM | POA: Diagnosis not present

## 2018-07-31 DIAGNOSIS — D631 Anemia in chronic kidney disease: Secondary | ICD-10-CM | POA: Diagnosis not present

## 2018-07-31 DIAGNOSIS — N2581 Secondary hyperparathyroidism of renal origin: Secondary | ICD-10-CM | POA: Diagnosis not present

## 2018-07-31 DIAGNOSIS — N186 End stage renal disease: Secondary | ICD-10-CM | POA: Diagnosis not present

## 2018-08-03 DIAGNOSIS — N2581 Secondary hyperparathyroidism of renal origin: Secondary | ICD-10-CM | POA: Diagnosis not present

## 2018-08-03 DIAGNOSIS — D631 Anemia in chronic kidney disease: Secondary | ICD-10-CM | POA: Diagnosis not present

## 2018-08-03 DIAGNOSIS — N186 End stage renal disease: Secondary | ICD-10-CM | POA: Diagnosis not present

## 2018-08-05 DIAGNOSIS — D631 Anemia in chronic kidney disease: Secondary | ICD-10-CM | POA: Diagnosis not present

## 2018-08-05 DIAGNOSIS — N186 End stage renal disease: Secondary | ICD-10-CM | POA: Diagnosis not present

## 2018-08-05 DIAGNOSIS — N2581 Secondary hyperparathyroidism of renal origin: Secondary | ICD-10-CM | POA: Diagnosis not present

## 2018-08-07 DIAGNOSIS — N186 End stage renal disease: Secondary | ICD-10-CM | POA: Diagnosis not present

## 2018-08-07 DIAGNOSIS — N2581 Secondary hyperparathyroidism of renal origin: Secondary | ICD-10-CM | POA: Diagnosis not present

## 2018-08-09 DIAGNOSIS — N186 End stage renal disease: Secondary | ICD-10-CM | POA: Diagnosis not present

## 2018-08-09 DIAGNOSIS — Z992 Dependence on renal dialysis: Secondary | ICD-10-CM | POA: Diagnosis not present

## 2018-08-09 DIAGNOSIS — N008 Acute nephritic syndrome with other morphologic changes: Secondary | ICD-10-CM | POA: Diagnosis not present

## 2018-08-10 DIAGNOSIS — D631 Anemia in chronic kidney disease: Secondary | ICD-10-CM | POA: Diagnosis not present

## 2018-08-10 DIAGNOSIS — N186 End stage renal disease: Secondary | ICD-10-CM | POA: Diagnosis not present

## 2018-08-10 DIAGNOSIS — N2581 Secondary hyperparathyroidism of renal origin: Secondary | ICD-10-CM | POA: Diagnosis not present

## 2018-08-10 DIAGNOSIS — Z23 Encounter for immunization: Secondary | ICD-10-CM | POA: Diagnosis not present

## 2018-08-12 DIAGNOSIS — N186 End stage renal disease: Secondary | ICD-10-CM | POA: Diagnosis not present

## 2018-08-12 DIAGNOSIS — Z23 Encounter for immunization: Secondary | ICD-10-CM | POA: Diagnosis not present

## 2018-08-12 DIAGNOSIS — D631 Anemia in chronic kidney disease: Secondary | ICD-10-CM | POA: Diagnosis not present

## 2018-08-12 DIAGNOSIS — N2581 Secondary hyperparathyroidism of renal origin: Secondary | ICD-10-CM | POA: Diagnosis not present

## 2018-08-14 DIAGNOSIS — N186 End stage renal disease: Secondary | ICD-10-CM | POA: Diagnosis not present

## 2018-08-14 DIAGNOSIS — D631 Anemia in chronic kidney disease: Secondary | ICD-10-CM | POA: Diagnosis not present

## 2018-08-14 DIAGNOSIS — N2581 Secondary hyperparathyroidism of renal origin: Secondary | ICD-10-CM | POA: Diagnosis not present

## 2018-08-14 DIAGNOSIS — Z23 Encounter for immunization: Secondary | ICD-10-CM | POA: Diagnosis not present

## 2018-08-17 DIAGNOSIS — N186 End stage renal disease: Secondary | ICD-10-CM | POA: Diagnosis not present

## 2018-08-17 DIAGNOSIS — D631 Anemia in chronic kidney disease: Secondary | ICD-10-CM | POA: Diagnosis not present

## 2018-08-17 DIAGNOSIS — N2581 Secondary hyperparathyroidism of renal origin: Secondary | ICD-10-CM | POA: Diagnosis not present

## 2018-08-17 DIAGNOSIS — Z23 Encounter for immunization: Secondary | ICD-10-CM | POA: Diagnosis not present

## 2018-08-19 DIAGNOSIS — N186 End stage renal disease: Secondary | ICD-10-CM | POA: Diagnosis not present

## 2018-08-19 DIAGNOSIS — N2581 Secondary hyperparathyroidism of renal origin: Secondary | ICD-10-CM | POA: Diagnosis not present

## 2018-08-19 DIAGNOSIS — D631 Anemia in chronic kidney disease: Secondary | ICD-10-CM | POA: Diagnosis not present

## 2018-08-19 DIAGNOSIS — Z23 Encounter for immunization: Secondary | ICD-10-CM | POA: Diagnosis not present

## 2018-08-20 DIAGNOSIS — Z23 Encounter for immunization: Secondary | ICD-10-CM | POA: Diagnosis not present

## 2018-08-20 DIAGNOSIS — N186 End stage renal disease: Secondary | ICD-10-CM | POA: Diagnosis not present

## 2018-08-20 DIAGNOSIS — D631 Anemia in chronic kidney disease: Secondary | ICD-10-CM | POA: Diagnosis not present

## 2018-08-20 DIAGNOSIS — N2581 Secondary hyperparathyroidism of renal origin: Secondary | ICD-10-CM | POA: Diagnosis not present

## 2018-08-21 DIAGNOSIS — N2581 Secondary hyperparathyroidism of renal origin: Secondary | ICD-10-CM | POA: Diagnosis not present

## 2018-08-21 DIAGNOSIS — D631 Anemia in chronic kidney disease: Secondary | ICD-10-CM | POA: Diagnosis not present

## 2018-08-21 DIAGNOSIS — N186 End stage renal disease: Secondary | ICD-10-CM | POA: Diagnosis not present

## 2018-08-21 DIAGNOSIS — Z23 Encounter for immunization: Secondary | ICD-10-CM | POA: Diagnosis not present

## 2018-08-24 DIAGNOSIS — Z23 Encounter for immunization: Secondary | ICD-10-CM | POA: Diagnosis not present

## 2018-08-24 DIAGNOSIS — D631 Anemia in chronic kidney disease: Secondary | ICD-10-CM | POA: Diagnosis not present

## 2018-08-24 DIAGNOSIS — N186 End stage renal disease: Secondary | ICD-10-CM | POA: Diagnosis not present

## 2018-08-24 DIAGNOSIS — N2581 Secondary hyperparathyroidism of renal origin: Secondary | ICD-10-CM | POA: Diagnosis not present

## 2018-08-26 DIAGNOSIS — N186 End stage renal disease: Secondary | ICD-10-CM | POA: Diagnosis not present

## 2018-08-26 DIAGNOSIS — Z23 Encounter for immunization: Secondary | ICD-10-CM | POA: Diagnosis not present

## 2018-08-26 DIAGNOSIS — D631 Anemia in chronic kidney disease: Secondary | ICD-10-CM | POA: Diagnosis not present

## 2018-08-26 DIAGNOSIS — N2581 Secondary hyperparathyroidism of renal origin: Secondary | ICD-10-CM | POA: Diagnosis not present

## 2018-08-29 DIAGNOSIS — Z23 Encounter for immunization: Secondary | ICD-10-CM | POA: Diagnosis not present

## 2018-08-29 DIAGNOSIS — D631 Anemia in chronic kidney disease: Secondary | ICD-10-CM | POA: Diagnosis not present

## 2018-08-29 DIAGNOSIS — N186 End stage renal disease: Secondary | ICD-10-CM | POA: Diagnosis not present

## 2018-08-29 DIAGNOSIS — N2581 Secondary hyperparathyroidism of renal origin: Secondary | ICD-10-CM | POA: Diagnosis not present

## 2018-08-31 DIAGNOSIS — N186 End stage renal disease: Secondary | ICD-10-CM | POA: Diagnosis not present

## 2018-08-31 DIAGNOSIS — D631 Anemia in chronic kidney disease: Secondary | ICD-10-CM | POA: Diagnosis not present

## 2018-08-31 DIAGNOSIS — Z23 Encounter for immunization: Secondary | ICD-10-CM | POA: Diagnosis not present

## 2018-08-31 DIAGNOSIS — N2581 Secondary hyperparathyroidism of renal origin: Secondary | ICD-10-CM | POA: Diagnosis not present

## 2018-09-02 DIAGNOSIS — N2581 Secondary hyperparathyroidism of renal origin: Secondary | ICD-10-CM | POA: Diagnosis not present

## 2018-09-02 DIAGNOSIS — Z23 Encounter for immunization: Secondary | ICD-10-CM | POA: Diagnosis not present

## 2018-09-02 DIAGNOSIS — N186 End stage renal disease: Secondary | ICD-10-CM | POA: Diagnosis not present

## 2018-09-02 DIAGNOSIS — D631 Anemia in chronic kidney disease: Secondary | ICD-10-CM | POA: Diagnosis not present

## 2018-09-03 DIAGNOSIS — Z23 Encounter for immunization: Secondary | ICD-10-CM | POA: Diagnosis not present

## 2018-09-03 DIAGNOSIS — N2581 Secondary hyperparathyroidism of renal origin: Secondary | ICD-10-CM | POA: Diagnosis not present

## 2018-09-03 DIAGNOSIS — D631 Anemia in chronic kidney disease: Secondary | ICD-10-CM | POA: Diagnosis not present

## 2018-09-03 DIAGNOSIS — N186 End stage renal disease: Secondary | ICD-10-CM | POA: Diagnosis not present

## 2018-09-07 DIAGNOSIS — N186 End stage renal disease: Secondary | ICD-10-CM | POA: Diagnosis not present

## 2018-09-07 DIAGNOSIS — Z23 Encounter for immunization: Secondary | ICD-10-CM | POA: Diagnosis not present

## 2018-09-07 DIAGNOSIS — D631 Anemia in chronic kidney disease: Secondary | ICD-10-CM | POA: Diagnosis not present

## 2018-09-07 DIAGNOSIS — N2581 Secondary hyperparathyroidism of renal origin: Secondary | ICD-10-CM | POA: Diagnosis not present

## 2018-09-09 DIAGNOSIS — N186 End stage renal disease: Secondary | ICD-10-CM | POA: Diagnosis not present

## 2018-09-09 DIAGNOSIS — N2581 Secondary hyperparathyroidism of renal origin: Secondary | ICD-10-CM | POA: Diagnosis not present

## 2018-09-09 DIAGNOSIS — D631 Anemia in chronic kidney disease: Secondary | ICD-10-CM | POA: Diagnosis not present

## 2018-09-09 DIAGNOSIS — D509 Iron deficiency anemia, unspecified: Secondary | ICD-10-CM | POA: Diagnosis not present

## 2018-09-11 DIAGNOSIS — D509 Iron deficiency anemia, unspecified: Secondary | ICD-10-CM | POA: Diagnosis not present

## 2018-09-11 DIAGNOSIS — D631 Anemia in chronic kidney disease: Secondary | ICD-10-CM | POA: Diagnosis not present

## 2018-09-11 DIAGNOSIS — N2581 Secondary hyperparathyroidism of renal origin: Secondary | ICD-10-CM | POA: Diagnosis not present

## 2018-09-11 DIAGNOSIS — N186 End stage renal disease: Secondary | ICD-10-CM | POA: Diagnosis not present

## 2018-09-14 DIAGNOSIS — N2581 Secondary hyperparathyroidism of renal origin: Secondary | ICD-10-CM | POA: Diagnosis not present

## 2018-09-14 DIAGNOSIS — D509 Iron deficiency anemia, unspecified: Secondary | ICD-10-CM | POA: Diagnosis not present

## 2018-09-14 DIAGNOSIS — D631 Anemia in chronic kidney disease: Secondary | ICD-10-CM | POA: Diagnosis not present

## 2018-09-14 DIAGNOSIS — N186 End stage renal disease: Secondary | ICD-10-CM | POA: Diagnosis not present

## 2018-09-16 DIAGNOSIS — D509 Iron deficiency anemia, unspecified: Secondary | ICD-10-CM | POA: Diagnosis not present

## 2018-09-16 DIAGNOSIS — N2581 Secondary hyperparathyroidism of renal origin: Secondary | ICD-10-CM | POA: Diagnosis not present

## 2018-09-16 DIAGNOSIS — N186 End stage renal disease: Secondary | ICD-10-CM | POA: Diagnosis not present

## 2018-09-16 DIAGNOSIS — D631 Anemia in chronic kidney disease: Secondary | ICD-10-CM | POA: Diagnosis not present

## 2018-09-18 DIAGNOSIS — D509 Iron deficiency anemia, unspecified: Secondary | ICD-10-CM | POA: Diagnosis not present

## 2018-09-18 DIAGNOSIS — D631 Anemia in chronic kidney disease: Secondary | ICD-10-CM | POA: Diagnosis not present

## 2018-09-18 DIAGNOSIS — N186 End stage renal disease: Secondary | ICD-10-CM | POA: Diagnosis not present

## 2018-09-18 DIAGNOSIS — N2581 Secondary hyperparathyroidism of renal origin: Secondary | ICD-10-CM | POA: Diagnosis not present

## 2018-09-21 DIAGNOSIS — D509 Iron deficiency anemia, unspecified: Secondary | ICD-10-CM | POA: Diagnosis not present

## 2018-09-21 DIAGNOSIS — D631 Anemia in chronic kidney disease: Secondary | ICD-10-CM | POA: Diagnosis not present

## 2018-09-21 DIAGNOSIS — N186 End stage renal disease: Secondary | ICD-10-CM | POA: Diagnosis not present

## 2018-09-21 DIAGNOSIS — N2581 Secondary hyperparathyroidism of renal origin: Secondary | ICD-10-CM | POA: Diagnosis not present

## 2018-09-23 DIAGNOSIS — N186 End stage renal disease: Secondary | ICD-10-CM | POA: Diagnosis not present

## 2018-09-23 DIAGNOSIS — N2581 Secondary hyperparathyroidism of renal origin: Secondary | ICD-10-CM | POA: Diagnosis not present

## 2018-09-23 DIAGNOSIS — D631 Anemia in chronic kidney disease: Secondary | ICD-10-CM | POA: Diagnosis not present

## 2018-09-23 DIAGNOSIS — D509 Iron deficiency anemia, unspecified: Secondary | ICD-10-CM | POA: Diagnosis not present

## 2018-09-25 DIAGNOSIS — D509 Iron deficiency anemia, unspecified: Secondary | ICD-10-CM | POA: Diagnosis not present

## 2018-09-25 DIAGNOSIS — N2581 Secondary hyperparathyroidism of renal origin: Secondary | ICD-10-CM | POA: Diagnosis not present

## 2018-09-25 DIAGNOSIS — D631 Anemia in chronic kidney disease: Secondary | ICD-10-CM | POA: Diagnosis not present

## 2018-09-25 DIAGNOSIS — N186 End stage renal disease: Secondary | ICD-10-CM | POA: Diagnosis not present

## 2018-09-28 DIAGNOSIS — N186 End stage renal disease: Secondary | ICD-10-CM | POA: Diagnosis not present

## 2018-09-28 DIAGNOSIS — N2581 Secondary hyperparathyroidism of renal origin: Secondary | ICD-10-CM | POA: Diagnosis not present

## 2018-09-28 DIAGNOSIS — D631 Anemia in chronic kidney disease: Secondary | ICD-10-CM | POA: Diagnosis not present

## 2018-09-28 DIAGNOSIS — D509 Iron deficiency anemia, unspecified: Secondary | ICD-10-CM | POA: Diagnosis not present

## 2018-09-30 DIAGNOSIS — N186 End stage renal disease: Secondary | ICD-10-CM | POA: Diagnosis not present

## 2018-09-30 DIAGNOSIS — D509 Iron deficiency anemia, unspecified: Secondary | ICD-10-CM | POA: Diagnosis not present

## 2018-09-30 DIAGNOSIS — D631 Anemia in chronic kidney disease: Secondary | ICD-10-CM | POA: Diagnosis not present

## 2018-09-30 DIAGNOSIS — N2581 Secondary hyperparathyroidism of renal origin: Secondary | ICD-10-CM | POA: Diagnosis not present

## 2018-10-02 DIAGNOSIS — D631 Anemia in chronic kidney disease: Secondary | ICD-10-CM | POA: Diagnosis not present

## 2018-10-02 DIAGNOSIS — N186 End stage renal disease: Secondary | ICD-10-CM | POA: Diagnosis not present

## 2018-10-02 DIAGNOSIS — D509 Iron deficiency anemia, unspecified: Secondary | ICD-10-CM | POA: Diagnosis not present

## 2018-10-02 DIAGNOSIS — N2581 Secondary hyperparathyroidism of renal origin: Secondary | ICD-10-CM | POA: Diagnosis not present

## 2018-10-05 DIAGNOSIS — D631 Anemia in chronic kidney disease: Secondary | ICD-10-CM | POA: Diagnosis not present

## 2018-10-05 DIAGNOSIS — D509 Iron deficiency anemia, unspecified: Secondary | ICD-10-CM | POA: Diagnosis not present

## 2018-10-05 DIAGNOSIS — N186 End stage renal disease: Secondary | ICD-10-CM | POA: Diagnosis not present

## 2018-10-05 DIAGNOSIS — N2581 Secondary hyperparathyroidism of renal origin: Secondary | ICD-10-CM | POA: Diagnosis not present

## 2018-10-07 DIAGNOSIS — N2581 Secondary hyperparathyroidism of renal origin: Secondary | ICD-10-CM | POA: Diagnosis not present

## 2018-10-07 DIAGNOSIS — N186 End stage renal disease: Secondary | ICD-10-CM | POA: Diagnosis not present

## 2018-10-07 DIAGNOSIS — D631 Anemia in chronic kidney disease: Secondary | ICD-10-CM | POA: Diagnosis not present

## 2018-10-07 DIAGNOSIS — D509 Iron deficiency anemia, unspecified: Secondary | ICD-10-CM | POA: Diagnosis not present

## 2018-10-08 DIAGNOSIS — N186 End stage renal disease: Secondary | ICD-10-CM | POA: Diagnosis not present

## 2018-10-08 DIAGNOSIS — N008 Acute nephritic syndrome with other morphologic changes: Secondary | ICD-10-CM | POA: Diagnosis not present

## 2018-10-08 DIAGNOSIS — Z992 Dependence on renal dialysis: Secondary | ICD-10-CM | POA: Diagnosis not present

## 2018-10-09 DIAGNOSIS — N008 Acute nephritic syndrome with other morphologic changes: Secondary | ICD-10-CM | POA: Diagnosis not present

## 2018-10-09 DIAGNOSIS — N186 End stage renal disease: Secondary | ICD-10-CM | POA: Diagnosis not present

## 2018-10-09 DIAGNOSIS — D509 Iron deficiency anemia, unspecified: Secondary | ICD-10-CM | POA: Diagnosis not present

## 2018-10-09 DIAGNOSIS — Z992 Dependence on renal dialysis: Secondary | ICD-10-CM | POA: Diagnosis not present

## 2018-10-09 DIAGNOSIS — N2581 Secondary hyperparathyroidism of renal origin: Secondary | ICD-10-CM | POA: Diagnosis not present

## 2018-10-12 DIAGNOSIS — N2581 Secondary hyperparathyroidism of renal origin: Secondary | ICD-10-CM | POA: Diagnosis not present

## 2018-10-12 DIAGNOSIS — N186 End stage renal disease: Secondary | ICD-10-CM | POA: Diagnosis not present

## 2018-10-12 DIAGNOSIS — D509 Iron deficiency anemia, unspecified: Secondary | ICD-10-CM | POA: Diagnosis not present

## 2018-10-14 DIAGNOSIS — D509 Iron deficiency anemia, unspecified: Secondary | ICD-10-CM | POA: Diagnosis not present

## 2018-10-14 DIAGNOSIS — N186 End stage renal disease: Secondary | ICD-10-CM | POA: Diagnosis not present

## 2018-10-14 DIAGNOSIS — N2581 Secondary hyperparathyroidism of renal origin: Secondary | ICD-10-CM | POA: Diagnosis not present

## 2018-10-16 DIAGNOSIS — D509 Iron deficiency anemia, unspecified: Secondary | ICD-10-CM | POA: Diagnosis not present

## 2018-10-16 DIAGNOSIS — N186 End stage renal disease: Secondary | ICD-10-CM | POA: Diagnosis not present

## 2018-10-16 DIAGNOSIS — N2581 Secondary hyperparathyroidism of renal origin: Secondary | ICD-10-CM | POA: Diagnosis not present

## 2018-10-19 DIAGNOSIS — D509 Iron deficiency anemia, unspecified: Secondary | ICD-10-CM | POA: Diagnosis not present

## 2018-10-19 DIAGNOSIS — N186 End stage renal disease: Secondary | ICD-10-CM | POA: Diagnosis not present

## 2018-10-19 DIAGNOSIS — N2581 Secondary hyperparathyroidism of renal origin: Secondary | ICD-10-CM | POA: Diagnosis not present

## 2018-10-21 DIAGNOSIS — D509 Iron deficiency anemia, unspecified: Secondary | ICD-10-CM | POA: Diagnosis not present

## 2018-10-21 DIAGNOSIS — N186 End stage renal disease: Secondary | ICD-10-CM | POA: Diagnosis not present

## 2018-10-21 DIAGNOSIS — N2581 Secondary hyperparathyroidism of renal origin: Secondary | ICD-10-CM | POA: Diagnosis not present

## 2018-10-23 DIAGNOSIS — N186 End stage renal disease: Secondary | ICD-10-CM | POA: Diagnosis not present

## 2018-10-23 DIAGNOSIS — D509 Iron deficiency anemia, unspecified: Secondary | ICD-10-CM | POA: Diagnosis not present

## 2018-10-23 DIAGNOSIS — N2581 Secondary hyperparathyroidism of renal origin: Secondary | ICD-10-CM | POA: Diagnosis not present

## 2018-10-26 DIAGNOSIS — N2581 Secondary hyperparathyroidism of renal origin: Secondary | ICD-10-CM | POA: Diagnosis not present

## 2018-10-26 DIAGNOSIS — D509 Iron deficiency anemia, unspecified: Secondary | ICD-10-CM | POA: Diagnosis not present

## 2018-10-26 DIAGNOSIS — N186 End stage renal disease: Secondary | ICD-10-CM | POA: Diagnosis not present

## 2018-10-28 DIAGNOSIS — N2581 Secondary hyperparathyroidism of renal origin: Secondary | ICD-10-CM | POA: Diagnosis not present

## 2018-10-28 DIAGNOSIS — N186 End stage renal disease: Secondary | ICD-10-CM | POA: Diagnosis not present

## 2018-10-28 DIAGNOSIS — D509 Iron deficiency anemia, unspecified: Secondary | ICD-10-CM | POA: Diagnosis not present

## 2018-10-30 DIAGNOSIS — N186 End stage renal disease: Secondary | ICD-10-CM | POA: Diagnosis not present

## 2018-10-30 DIAGNOSIS — D509 Iron deficiency anemia, unspecified: Secondary | ICD-10-CM | POA: Diagnosis not present

## 2018-10-30 DIAGNOSIS — N2581 Secondary hyperparathyroidism of renal origin: Secondary | ICD-10-CM | POA: Diagnosis not present

## 2018-11-02 DIAGNOSIS — N2581 Secondary hyperparathyroidism of renal origin: Secondary | ICD-10-CM | POA: Diagnosis not present

## 2018-11-02 DIAGNOSIS — N186 End stage renal disease: Secondary | ICD-10-CM | POA: Diagnosis not present

## 2018-11-04 DIAGNOSIS — N186 End stage renal disease: Secondary | ICD-10-CM | POA: Diagnosis not present

## 2018-11-04 DIAGNOSIS — N2581 Secondary hyperparathyroidism of renal origin: Secondary | ICD-10-CM | POA: Diagnosis not present

## 2018-11-06 DIAGNOSIS — N186 End stage renal disease: Secondary | ICD-10-CM | POA: Diagnosis not present

## 2018-11-06 DIAGNOSIS — N2581 Secondary hyperparathyroidism of renal origin: Secondary | ICD-10-CM | POA: Diagnosis not present

## 2018-11-08 DIAGNOSIS — Z992 Dependence on renal dialysis: Secondary | ICD-10-CM | POA: Diagnosis not present

## 2018-11-08 DIAGNOSIS — N008 Acute nephritic syndrome with other morphologic changes: Secondary | ICD-10-CM | POA: Diagnosis not present

## 2018-11-08 DIAGNOSIS — N186 End stage renal disease: Secondary | ICD-10-CM | POA: Diagnosis not present

## 2018-11-09 DIAGNOSIS — N186 End stage renal disease: Secondary | ICD-10-CM | POA: Diagnosis not present

## 2018-11-09 DIAGNOSIS — N2581 Secondary hyperparathyroidism of renal origin: Secondary | ICD-10-CM | POA: Diagnosis not present

## 2018-11-11 DIAGNOSIS — N2581 Secondary hyperparathyroidism of renal origin: Secondary | ICD-10-CM | POA: Diagnosis not present

## 2018-11-11 DIAGNOSIS — N186 End stage renal disease: Secondary | ICD-10-CM | POA: Diagnosis not present

## 2018-11-13 DIAGNOSIS — D509 Iron deficiency anemia, unspecified: Secondary | ICD-10-CM | POA: Diagnosis not present

## 2018-11-13 DIAGNOSIS — D631 Anemia in chronic kidney disease: Secondary | ICD-10-CM | POA: Diagnosis not present

## 2018-11-13 DIAGNOSIS — N2581 Secondary hyperparathyroidism of renal origin: Secondary | ICD-10-CM | POA: Diagnosis not present

## 2018-11-13 DIAGNOSIS — N186 End stage renal disease: Secondary | ICD-10-CM | POA: Diagnosis not present

## 2018-11-16 DIAGNOSIS — D509 Iron deficiency anemia, unspecified: Secondary | ICD-10-CM | POA: Diagnosis not present

## 2018-11-16 DIAGNOSIS — N2581 Secondary hyperparathyroidism of renal origin: Secondary | ICD-10-CM | POA: Diagnosis not present

## 2018-11-16 DIAGNOSIS — N186 End stage renal disease: Secondary | ICD-10-CM | POA: Diagnosis not present

## 2018-11-16 DIAGNOSIS — D631 Anemia in chronic kidney disease: Secondary | ICD-10-CM | POA: Diagnosis not present

## 2018-11-18 DIAGNOSIS — N186 End stage renal disease: Secondary | ICD-10-CM | POA: Diagnosis not present

## 2018-11-18 DIAGNOSIS — N2581 Secondary hyperparathyroidism of renal origin: Secondary | ICD-10-CM | POA: Diagnosis not present

## 2018-11-18 DIAGNOSIS — D631 Anemia in chronic kidney disease: Secondary | ICD-10-CM | POA: Diagnosis not present

## 2018-11-18 DIAGNOSIS — D509 Iron deficiency anemia, unspecified: Secondary | ICD-10-CM | POA: Diagnosis not present

## 2018-11-20 DIAGNOSIS — N2581 Secondary hyperparathyroidism of renal origin: Secondary | ICD-10-CM | POA: Diagnosis not present

## 2018-11-20 DIAGNOSIS — D631 Anemia in chronic kidney disease: Secondary | ICD-10-CM | POA: Diagnosis not present

## 2018-11-20 DIAGNOSIS — D509 Iron deficiency anemia, unspecified: Secondary | ICD-10-CM | POA: Diagnosis not present

## 2018-11-20 DIAGNOSIS — N186 End stage renal disease: Secondary | ICD-10-CM | POA: Diagnosis not present

## 2018-11-23 DIAGNOSIS — N186 End stage renal disease: Secondary | ICD-10-CM | POA: Diagnosis not present

## 2018-11-23 DIAGNOSIS — N2581 Secondary hyperparathyroidism of renal origin: Secondary | ICD-10-CM | POA: Diagnosis not present

## 2018-11-23 DIAGNOSIS — D631 Anemia in chronic kidney disease: Secondary | ICD-10-CM | POA: Diagnosis not present

## 2018-11-23 DIAGNOSIS — D509 Iron deficiency anemia, unspecified: Secondary | ICD-10-CM | POA: Diagnosis not present

## 2018-11-25 DIAGNOSIS — N2581 Secondary hyperparathyroidism of renal origin: Secondary | ICD-10-CM | POA: Diagnosis not present

## 2018-11-25 DIAGNOSIS — D631 Anemia in chronic kidney disease: Secondary | ICD-10-CM | POA: Diagnosis not present

## 2018-11-25 DIAGNOSIS — N186 End stage renal disease: Secondary | ICD-10-CM | POA: Diagnosis not present

## 2018-11-25 DIAGNOSIS — D509 Iron deficiency anemia, unspecified: Secondary | ICD-10-CM | POA: Diagnosis not present

## 2018-11-27 DIAGNOSIS — N186 End stage renal disease: Secondary | ICD-10-CM | POA: Diagnosis not present

## 2018-11-27 DIAGNOSIS — N2581 Secondary hyperparathyroidism of renal origin: Secondary | ICD-10-CM | POA: Diagnosis not present

## 2018-11-27 DIAGNOSIS — D509 Iron deficiency anemia, unspecified: Secondary | ICD-10-CM | POA: Diagnosis not present

## 2018-11-27 DIAGNOSIS — D631 Anemia in chronic kidney disease: Secondary | ICD-10-CM | POA: Diagnosis not present

## 2018-11-29 DIAGNOSIS — D509 Iron deficiency anemia, unspecified: Secondary | ICD-10-CM | POA: Diagnosis not present

## 2018-11-29 DIAGNOSIS — N2581 Secondary hyperparathyroidism of renal origin: Secondary | ICD-10-CM | POA: Diagnosis not present

## 2018-11-29 DIAGNOSIS — D631 Anemia in chronic kidney disease: Secondary | ICD-10-CM | POA: Diagnosis not present

## 2018-11-29 DIAGNOSIS — N186 End stage renal disease: Secondary | ICD-10-CM | POA: Diagnosis not present

## 2018-12-01 DIAGNOSIS — D509 Iron deficiency anemia, unspecified: Secondary | ICD-10-CM | POA: Diagnosis not present

## 2018-12-01 DIAGNOSIS — N186 End stage renal disease: Secondary | ICD-10-CM | POA: Diagnosis not present

## 2018-12-01 DIAGNOSIS — N2581 Secondary hyperparathyroidism of renal origin: Secondary | ICD-10-CM | POA: Diagnosis not present

## 2018-12-01 DIAGNOSIS — D631 Anemia in chronic kidney disease: Secondary | ICD-10-CM | POA: Diagnosis not present

## 2018-12-04 DIAGNOSIS — D631 Anemia in chronic kidney disease: Secondary | ICD-10-CM | POA: Diagnosis not present

## 2018-12-04 DIAGNOSIS — N186 End stage renal disease: Secondary | ICD-10-CM | POA: Diagnosis not present

## 2018-12-04 DIAGNOSIS — N2581 Secondary hyperparathyroidism of renal origin: Secondary | ICD-10-CM | POA: Diagnosis not present

## 2018-12-04 DIAGNOSIS — D509 Iron deficiency anemia, unspecified: Secondary | ICD-10-CM | POA: Diagnosis not present

## 2018-12-06 DIAGNOSIS — D631 Anemia in chronic kidney disease: Secondary | ICD-10-CM | POA: Diagnosis not present

## 2018-12-06 DIAGNOSIS — D509 Iron deficiency anemia, unspecified: Secondary | ICD-10-CM | POA: Diagnosis not present

## 2018-12-06 DIAGNOSIS — N186 End stage renal disease: Secondary | ICD-10-CM | POA: Diagnosis not present

## 2018-12-06 DIAGNOSIS — N2581 Secondary hyperparathyroidism of renal origin: Secondary | ICD-10-CM | POA: Diagnosis not present

## 2018-12-08 DIAGNOSIS — N2581 Secondary hyperparathyroidism of renal origin: Secondary | ICD-10-CM | POA: Diagnosis not present

## 2018-12-08 DIAGNOSIS — D509 Iron deficiency anemia, unspecified: Secondary | ICD-10-CM | POA: Diagnosis not present

## 2018-12-08 DIAGNOSIS — D631 Anemia in chronic kidney disease: Secondary | ICD-10-CM | POA: Diagnosis not present

## 2018-12-08 DIAGNOSIS — N186 End stage renal disease: Secondary | ICD-10-CM | POA: Diagnosis not present

## 2018-12-09 DIAGNOSIS — N186 End stage renal disease: Secondary | ICD-10-CM | POA: Diagnosis not present

## 2018-12-09 DIAGNOSIS — Z992 Dependence on renal dialysis: Secondary | ICD-10-CM | POA: Diagnosis not present

## 2018-12-09 DIAGNOSIS — N008 Acute nephritic syndrome with other morphologic changes: Secondary | ICD-10-CM | POA: Diagnosis not present

## 2018-12-11 DIAGNOSIS — N186 End stage renal disease: Secondary | ICD-10-CM | POA: Diagnosis not present

## 2018-12-11 DIAGNOSIS — D509 Iron deficiency anemia, unspecified: Secondary | ICD-10-CM | POA: Diagnosis not present

## 2018-12-11 DIAGNOSIS — N2581 Secondary hyperparathyroidism of renal origin: Secondary | ICD-10-CM | POA: Diagnosis not present

## 2018-12-11 DIAGNOSIS — D631 Anemia in chronic kidney disease: Secondary | ICD-10-CM | POA: Diagnosis not present

## 2018-12-14 DIAGNOSIS — D631 Anemia in chronic kidney disease: Secondary | ICD-10-CM | POA: Diagnosis not present

## 2018-12-14 DIAGNOSIS — N186 End stage renal disease: Secondary | ICD-10-CM | POA: Diagnosis not present

## 2018-12-14 DIAGNOSIS — D509 Iron deficiency anemia, unspecified: Secondary | ICD-10-CM | POA: Diagnosis not present

## 2018-12-14 DIAGNOSIS — N2581 Secondary hyperparathyroidism of renal origin: Secondary | ICD-10-CM | POA: Diagnosis not present

## 2018-12-16 DIAGNOSIS — N2581 Secondary hyperparathyroidism of renal origin: Secondary | ICD-10-CM | POA: Diagnosis not present

## 2018-12-16 DIAGNOSIS — D509 Iron deficiency anemia, unspecified: Secondary | ICD-10-CM | POA: Diagnosis not present

## 2018-12-16 DIAGNOSIS — N186 End stage renal disease: Secondary | ICD-10-CM | POA: Diagnosis not present

## 2018-12-16 DIAGNOSIS — D631 Anemia in chronic kidney disease: Secondary | ICD-10-CM | POA: Diagnosis not present

## 2018-12-17 DIAGNOSIS — D649 Anemia, unspecified: Secondary | ICD-10-CM | POA: Diagnosis not present

## 2018-12-17 DIAGNOSIS — Z86010 Personal history of colonic polyps: Secondary | ICD-10-CM | POA: Diagnosis not present

## 2018-12-17 DIAGNOSIS — Z85528 Personal history of other malignant neoplasm of kidney: Secondary | ICD-10-CM | POA: Diagnosis not present

## 2018-12-17 DIAGNOSIS — N186 End stage renal disease: Secondary | ICD-10-CM | POA: Diagnosis not present

## 2018-12-17 DIAGNOSIS — Z992 Dependence on renal dialysis: Secondary | ICD-10-CM | POA: Diagnosis not present

## 2018-12-18 DIAGNOSIS — D509 Iron deficiency anemia, unspecified: Secondary | ICD-10-CM | POA: Diagnosis not present

## 2018-12-18 DIAGNOSIS — N186 End stage renal disease: Secondary | ICD-10-CM | POA: Diagnosis not present

## 2018-12-18 DIAGNOSIS — D631 Anemia in chronic kidney disease: Secondary | ICD-10-CM | POA: Diagnosis not present

## 2018-12-18 DIAGNOSIS — N2581 Secondary hyperparathyroidism of renal origin: Secondary | ICD-10-CM | POA: Diagnosis not present

## 2018-12-21 DIAGNOSIS — D509 Iron deficiency anemia, unspecified: Secondary | ICD-10-CM | POA: Diagnosis not present

## 2018-12-21 DIAGNOSIS — N186 End stage renal disease: Secondary | ICD-10-CM | POA: Diagnosis not present

## 2018-12-21 DIAGNOSIS — N2581 Secondary hyperparathyroidism of renal origin: Secondary | ICD-10-CM | POA: Diagnosis not present

## 2018-12-21 DIAGNOSIS — D631 Anemia in chronic kidney disease: Secondary | ICD-10-CM | POA: Diagnosis not present

## 2018-12-23 DIAGNOSIS — D631 Anemia in chronic kidney disease: Secondary | ICD-10-CM | POA: Diagnosis not present

## 2018-12-23 DIAGNOSIS — N186 End stage renal disease: Secondary | ICD-10-CM | POA: Diagnosis not present

## 2018-12-23 DIAGNOSIS — N2581 Secondary hyperparathyroidism of renal origin: Secondary | ICD-10-CM | POA: Diagnosis not present

## 2018-12-23 DIAGNOSIS — D509 Iron deficiency anemia, unspecified: Secondary | ICD-10-CM | POA: Diagnosis not present

## 2018-12-25 DIAGNOSIS — D509 Iron deficiency anemia, unspecified: Secondary | ICD-10-CM | POA: Diagnosis not present

## 2018-12-25 DIAGNOSIS — D631 Anemia in chronic kidney disease: Secondary | ICD-10-CM | POA: Diagnosis not present

## 2018-12-25 DIAGNOSIS — N2581 Secondary hyperparathyroidism of renal origin: Secondary | ICD-10-CM | POA: Diagnosis not present

## 2018-12-25 DIAGNOSIS — N186 End stage renal disease: Secondary | ICD-10-CM | POA: Diagnosis not present

## 2018-12-28 DIAGNOSIS — D631 Anemia in chronic kidney disease: Secondary | ICD-10-CM | POA: Diagnosis not present

## 2018-12-28 DIAGNOSIS — D509 Iron deficiency anemia, unspecified: Secondary | ICD-10-CM | POA: Diagnosis not present

## 2018-12-28 DIAGNOSIS — N186 End stage renal disease: Secondary | ICD-10-CM | POA: Diagnosis not present

## 2018-12-28 DIAGNOSIS — N2581 Secondary hyperparathyroidism of renal origin: Secondary | ICD-10-CM | POA: Diagnosis not present

## 2018-12-30 DIAGNOSIS — D509 Iron deficiency anemia, unspecified: Secondary | ICD-10-CM | POA: Diagnosis not present

## 2018-12-30 DIAGNOSIS — D631 Anemia in chronic kidney disease: Secondary | ICD-10-CM | POA: Diagnosis not present

## 2018-12-30 DIAGNOSIS — N2581 Secondary hyperparathyroidism of renal origin: Secondary | ICD-10-CM | POA: Diagnosis not present

## 2018-12-30 DIAGNOSIS — N186 End stage renal disease: Secondary | ICD-10-CM | POA: Diagnosis not present

## 2019-01-01 DIAGNOSIS — D509 Iron deficiency anemia, unspecified: Secondary | ICD-10-CM | POA: Diagnosis not present

## 2019-01-01 DIAGNOSIS — D631 Anemia in chronic kidney disease: Secondary | ICD-10-CM | POA: Diagnosis not present

## 2019-01-01 DIAGNOSIS — N2581 Secondary hyperparathyroidism of renal origin: Secondary | ICD-10-CM | POA: Diagnosis not present

## 2019-01-01 DIAGNOSIS — N186 End stage renal disease: Secondary | ICD-10-CM | POA: Diagnosis not present

## 2019-01-04 DIAGNOSIS — D631 Anemia in chronic kidney disease: Secondary | ICD-10-CM | POA: Diagnosis not present

## 2019-01-04 DIAGNOSIS — N186 End stage renal disease: Secondary | ICD-10-CM | POA: Diagnosis not present

## 2019-01-04 DIAGNOSIS — N2581 Secondary hyperparathyroidism of renal origin: Secondary | ICD-10-CM | POA: Diagnosis not present

## 2019-01-04 DIAGNOSIS — D509 Iron deficiency anemia, unspecified: Secondary | ICD-10-CM | POA: Diagnosis not present

## 2019-01-06 DIAGNOSIS — N2581 Secondary hyperparathyroidism of renal origin: Secondary | ICD-10-CM | POA: Diagnosis not present

## 2019-01-06 DIAGNOSIS — D509 Iron deficiency anemia, unspecified: Secondary | ICD-10-CM | POA: Diagnosis not present

## 2019-01-06 DIAGNOSIS — N186 End stage renal disease: Secondary | ICD-10-CM | POA: Diagnosis not present

## 2019-01-06 DIAGNOSIS — D631 Anemia in chronic kidney disease: Secondary | ICD-10-CM | POA: Diagnosis not present

## 2019-01-08 DIAGNOSIS — D631 Anemia in chronic kidney disease: Secondary | ICD-10-CM | POA: Diagnosis not present

## 2019-01-08 DIAGNOSIS — N186 End stage renal disease: Secondary | ICD-10-CM | POA: Diagnosis not present

## 2019-01-08 DIAGNOSIS — D509 Iron deficiency anemia, unspecified: Secondary | ICD-10-CM | POA: Diagnosis not present

## 2019-01-08 DIAGNOSIS — N2581 Secondary hyperparathyroidism of renal origin: Secondary | ICD-10-CM | POA: Diagnosis not present

## 2019-01-09 DIAGNOSIS — Z992 Dependence on renal dialysis: Secondary | ICD-10-CM | POA: Diagnosis not present

## 2019-01-09 DIAGNOSIS — N008 Acute nephritic syndrome with other morphologic changes: Secondary | ICD-10-CM | POA: Diagnosis not present

## 2019-01-09 DIAGNOSIS — N186 End stage renal disease: Secondary | ICD-10-CM | POA: Diagnosis not present

## 2019-01-11 DIAGNOSIS — D509 Iron deficiency anemia, unspecified: Secondary | ICD-10-CM | POA: Diagnosis not present

## 2019-01-11 DIAGNOSIS — N186 End stage renal disease: Secondary | ICD-10-CM | POA: Diagnosis not present

## 2019-01-11 DIAGNOSIS — N2581 Secondary hyperparathyroidism of renal origin: Secondary | ICD-10-CM | POA: Diagnosis not present

## 2019-01-12 DIAGNOSIS — Z131 Encounter for screening for diabetes mellitus: Secondary | ICD-10-CM | POA: Diagnosis not present

## 2019-01-12 DIAGNOSIS — Z136 Encounter for screening for cardiovascular disorders: Secondary | ICD-10-CM | POA: Diagnosis not present

## 2019-01-12 DIAGNOSIS — Z905 Acquired absence of kidney: Secondary | ICD-10-CM | POA: Diagnosis not present

## 2019-01-12 DIAGNOSIS — Z125 Encounter for screening for malignant neoplasm of prostate: Secondary | ICD-10-CM | POA: Diagnosis not present

## 2019-01-12 DIAGNOSIS — N186 End stage renal disease: Secondary | ICD-10-CM | POA: Diagnosis not present

## 2019-01-12 DIAGNOSIS — Z85528 Personal history of other malignant neoplasm of kidney: Secondary | ICD-10-CM | POA: Diagnosis not present

## 2019-01-12 DIAGNOSIS — Z0000 Encounter for general adult medical examination without abnormal findings: Secondary | ICD-10-CM | POA: Diagnosis not present

## 2019-01-12 DIAGNOSIS — Z23 Encounter for immunization: Secondary | ICD-10-CM | POA: Diagnosis not present

## 2019-01-13 DIAGNOSIS — N2581 Secondary hyperparathyroidism of renal origin: Secondary | ICD-10-CM | POA: Diagnosis not present

## 2019-01-13 DIAGNOSIS — N186 End stage renal disease: Secondary | ICD-10-CM | POA: Diagnosis not present

## 2019-01-13 DIAGNOSIS — D509 Iron deficiency anemia, unspecified: Secondary | ICD-10-CM | POA: Diagnosis not present

## 2019-01-15 DIAGNOSIS — N2581 Secondary hyperparathyroidism of renal origin: Secondary | ICD-10-CM | POA: Diagnosis not present

## 2019-01-15 DIAGNOSIS — N186 End stage renal disease: Secondary | ICD-10-CM | POA: Diagnosis not present

## 2019-01-15 DIAGNOSIS — D509 Iron deficiency anemia, unspecified: Secondary | ICD-10-CM | POA: Diagnosis not present

## 2019-01-18 DIAGNOSIS — D509 Iron deficiency anemia, unspecified: Secondary | ICD-10-CM | POA: Diagnosis not present

## 2019-01-18 DIAGNOSIS — N186 End stage renal disease: Secondary | ICD-10-CM | POA: Diagnosis not present

## 2019-01-18 DIAGNOSIS — N2581 Secondary hyperparathyroidism of renal origin: Secondary | ICD-10-CM | POA: Diagnosis not present

## 2019-01-20 DIAGNOSIS — D509 Iron deficiency anemia, unspecified: Secondary | ICD-10-CM | POA: Diagnosis not present

## 2019-01-20 DIAGNOSIS — N2581 Secondary hyperparathyroidism of renal origin: Secondary | ICD-10-CM | POA: Diagnosis not present

## 2019-01-20 DIAGNOSIS — N186 End stage renal disease: Secondary | ICD-10-CM | POA: Diagnosis not present

## 2019-01-22 DIAGNOSIS — D509 Iron deficiency anemia, unspecified: Secondary | ICD-10-CM | POA: Diagnosis not present

## 2019-01-22 DIAGNOSIS — N186 End stage renal disease: Secondary | ICD-10-CM | POA: Diagnosis not present

## 2019-01-22 DIAGNOSIS — N2581 Secondary hyperparathyroidism of renal origin: Secondary | ICD-10-CM | POA: Diagnosis not present

## 2019-01-25 DIAGNOSIS — N2581 Secondary hyperparathyroidism of renal origin: Secondary | ICD-10-CM | POA: Diagnosis not present

## 2019-01-25 DIAGNOSIS — D509 Iron deficiency anemia, unspecified: Secondary | ICD-10-CM | POA: Diagnosis not present

## 2019-01-25 DIAGNOSIS — N186 End stage renal disease: Secondary | ICD-10-CM | POA: Diagnosis not present

## 2019-01-27 DIAGNOSIS — N186 End stage renal disease: Secondary | ICD-10-CM | POA: Diagnosis not present

## 2019-01-27 DIAGNOSIS — D509 Iron deficiency anemia, unspecified: Secondary | ICD-10-CM | POA: Diagnosis not present

## 2019-01-27 DIAGNOSIS — N2581 Secondary hyperparathyroidism of renal origin: Secondary | ICD-10-CM | POA: Diagnosis not present

## 2019-01-29 DIAGNOSIS — N186 End stage renal disease: Secondary | ICD-10-CM | POA: Diagnosis not present

## 2019-01-29 DIAGNOSIS — D509 Iron deficiency anemia, unspecified: Secondary | ICD-10-CM | POA: Diagnosis not present

## 2019-01-29 DIAGNOSIS — N2581 Secondary hyperparathyroidism of renal origin: Secondary | ICD-10-CM | POA: Diagnosis not present

## 2019-02-01 DIAGNOSIS — N2581 Secondary hyperparathyroidism of renal origin: Secondary | ICD-10-CM | POA: Diagnosis not present

## 2019-02-01 DIAGNOSIS — D509 Iron deficiency anemia, unspecified: Secondary | ICD-10-CM | POA: Diagnosis not present

## 2019-02-01 DIAGNOSIS — N186 End stage renal disease: Secondary | ICD-10-CM | POA: Diagnosis not present

## 2019-02-03 DIAGNOSIS — N2581 Secondary hyperparathyroidism of renal origin: Secondary | ICD-10-CM | POA: Diagnosis not present

## 2019-02-03 DIAGNOSIS — N186 End stage renal disease: Secondary | ICD-10-CM | POA: Diagnosis not present

## 2019-02-03 DIAGNOSIS — D509 Iron deficiency anemia, unspecified: Secondary | ICD-10-CM | POA: Diagnosis not present

## 2019-02-05 DIAGNOSIS — N186 End stage renal disease: Secondary | ICD-10-CM | POA: Diagnosis not present

## 2019-02-05 DIAGNOSIS — D509 Iron deficiency anemia, unspecified: Secondary | ICD-10-CM | POA: Diagnosis not present

## 2019-02-05 DIAGNOSIS — N2581 Secondary hyperparathyroidism of renal origin: Secondary | ICD-10-CM | POA: Diagnosis not present

## 2019-02-07 DIAGNOSIS — N008 Acute nephritic syndrome with other morphologic changes: Secondary | ICD-10-CM | POA: Diagnosis not present

## 2019-02-07 DIAGNOSIS — Z992 Dependence on renal dialysis: Secondary | ICD-10-CM | POA: Diagnosis not present

## 2019-02-07 DIAGNOSIS — N186 End stage renal disease: Secondary | ICD-10-CM | POA: Diagnosis not present

## 2019-02-08 DIAGNOSIS — D509 Iron deficiency anemia, unspecified: Secondary | ICD-10-CM | POA: Diagnosis not present

## 2019-02-08 DIAGNOSIS — N186 End stage renal disease: Secondary | ICD-10-CM | POA: Diagnosis not present

## 2019-02-08 DIAGNOSIS — D631 Anemia in chronic kidney disease: Secondary | ICD-10-CM | POA: Diagnosis not present

## 2019-02-08 DIAGNOSIS — N2581 Secondary hyperparathyroidism of renal origin: Secondary | ICD-10-CM | POA: Diagnosis not present

## 2019-02-10 DIAGNOSIS — N2581 Secondary hyperparathyroidism of renal origin: Secondary | ICD-10-CM | POA: Diagnosis not present

## 2019-02-10 DIAGNOSIS — N186 End stage renal disease: Secondary | ICD-10-CM | POA: Diagnosis not present

## 2019-02-10 DIAGNOSIS — D509 Iron deficiency anemia, unspecified: Secondary | ICD-10-CM | POA: Diagnosis not present

## 2019-02-10 DIAGNOSIS — D631 Anemia in chronic kidney disease: Secondary | ICD-10-CM | POA: Diagnosis not present

## 2019-02-12 DIAGNOSIS — D509 Iron deficiency anemia, unspecified: Secondary | ICD-10-CM | POA: Diagnosis not present

## 2019-02-12 DIAGNOSIS — N2581 Secondary hyperparathyroidism of renal origin: Secondary | ICD-10-CM | POA: Diagnosis not present

## 2019-02-12 DIAGNOSIS — D631 Anemia in chronic kidney disease: Secondary | ICD-10-CM | POA: Diagnosis not present

## 2019-02-12 DIAGNOSIS — N186 End stage renal disease: Secondary | ICD-10-CM | POA: Diagnosis not present

## 2019-02-15 DIAGNOSIS — D509 Iron deficiency anemia, unspecified: Secondary | ICD-10-CM | POA: Diagnosis not present

## 2019-02-15 DIAGNOSIS — N2581 Secondary hyperparathyroidism of renal origin: Secondary | ICD-10-CM | POA: Diagnosis not present

## 2019-02-15 DIAGNOSIS — D631 Anemia in chronic kidney disease: Secondary | ICD-10-CM | POA: Diagnosis not present

## 2019-02-15 DIAGNOSIS — N186 End stage renal disease: Secondary | ICD-10-CM | POA: Diagnosis not present

## 2019-02-17 DIAGNOSIS — D509 Iron deficiency anemia, unspecified: Secondary | ICD-10-CM | POA: Diagnosis not present

## 2019-02-17 DIAGNOSIS — N2581 Secondary hyperparathyroidism of renal origin: Secondary | ICD-10-CM | POA: Diagnosis not present

## 2019-02-17 DIAGNOSIS — D631 Anemia in chronic kidney disease: Secondary | ICD-10-CM | POA: Diagnosis not present

## 2019-02-17 DIAGNOSIS — N186 End stage renal disease: Secondary | ICD-10-CM | POA: Diagnosis not present

## 2019-02-19 DIAGNOSIS — D631 Anemia in chronic kidney disease: Secondary | ICD-10-CM | POA: Diagnosis not present

## 2019-02-19 DIAGNOSIS — D509 Iron deficiency anemia, unspecified: Secondary | ICD-10-CM | POA: Diagnosis not present

## 2019-02-19 DIAGNOSIS — N2581 Secondary hyperparathyroidism of renal origin: Secondary | ICD-10-CM | POA: Diagnosis not present

## 2019-02-19 DIAGNOSIS — N186 End stage renal disease: Secondary | ICD-10-CM | POA: Diagnosis not present

## 2019-02-22 DIAGNOSIS — N2581 Secondary hyperparathyroidism of renal origin: Secondary | ICD-10-CM | POA: Diagnosis not present

## 2019-02-22 DIAGNOSIS — N186 End stage renal disease: Secondary | ICD-10-CM | POA: Diagnosis not present

## 2019-02-22 DIAGNOSIS — D631 Anemia in chronic kidney disease: Secondary | ICD-10-CM | POA: Diagnosis not present

## 2019-02-22 DIAGNOSIS — D509 Iron deficiency anemia, unspecified: Secondary | ICD-10-CM | POA: Diagnosis not present

## 2019-02-24 DIAGNOSIS — N186 End stage renal disease: Secondary | ICD-10-CM | POA: Diagnosis not present

## 2019-02-24 DIAGNOSIS — N2581 Secondary hyperparathyroidism of renal origin: Secondary | ICD-10-CM | POA: Diagnosis not present

## 2019-02-24 DIAGNOSIS — D631 Anemia in chronic kidney disease: Secondary | ICD-10-CM | POA: Diagnosis not present

## 2019-02-24 DIAGNOSIS — D509 Iron deficiency anemia, unspecified: Secondary | ICD-10-CM | POA: Diagnosis not present

## 2019-02-26 DIAGNOSIS — N2581 Secondary hyperparathyroidism of renal origin: Secondary | ICD-10-CM | POA: Diagnosis not present

## 2019-02-26 DIAGNOSIS — D631 Anemia in chronic kidney disease: Secondary | ICD-10-CM | POA: Diagnosis not present

## 2019-02-26 DIAGNOSIS — N186 End stage renal disease: Secondary | ICD-10-CM | POA: Diagnosis not present

## 2019-02-26 DIAGNOSIS — D509 Iron deficiency anemia, unspecified: Secondary | ICD-10-CM | POA: Diagnosis not present

## 2019-03-01 DIAGNOSIS — D631 Anemia in chronic kidney disease: Secondary | ICD-10-CM | POA: Diagnosis not present

## 2019-03-01 DIAGNOSIS — D509 Iron deficiency anemia, unspecified: Secondary | ICD-10-CM | POA: Diagnosis not present

## 2019-03-01 DIAGNOSIS — N186 End stage renal disease: Secondary | ICD-10-CM | POA: Diagnosis not present

## 2019-03-01 DIAGNOSIS — N2581 Secondary hyperparathyroidism of renal origin: Secondary | ICD-10-CM | POA: Diagnosis not present

## 2019-03-03 DIAGNOSIS — N2581 Secondary hyperparathyroidism of renal origin: Secondary | ICD-10-CM | POA: Diagnosis not present

## 2019-03-03 DIAGNOSIS — D631 Anemia in chronic kidney disease: Secondary | ICD-10-CM | POA: Diagnosis not present

## 2019-03-03 DIAGNOSIS — D509 Iron deficiency anemia, unspecified: Secondary | ICD-10-CM | POA: Diagnosis not present

## 2019-03-03 DIAGNOSIS — N186 End stage renal disease: Secondary | ICD-10-CM | POA: Diagnosis not present

## 2019-03-05 DIAGNOSIS — N2581 Secondary hyperparathyroidism of renal origin: Secondary | ICD-10-CM | POA: Diagnosis not present

## 2019-03-05 DIAGNOSIS — D509 Iron deficiency anemia, unspecified: Secondary | ICD-10-CM | POA: Diagnosis not present

## 2019-03-05 DIAGNOSIS — D631 Anemia in chronic kidney disease: Secondary | ICD-10-CM | POA: Diagnosis not present

## 2019-03-05 DIAGNOSIS — N186 End stage renal disease: Secondary | ICD-10-CM | POA: Diagnosis not present

## 2019-03-08 DIAGNOSIS — D509 Iron deficiency anemia, unspecified: Secondary | ICD-10-CM | POA: Diagnosis not present

## 2019-03-08 DIAGNOSIS — N186 End stage renal disease: Secondary | ICD-10-CM | POA: Diagnosis not present

## 2019-03-08 DIAGNOSIS — N2581 Secondary hyperparathyroidism of renal origin: Secondary | ICD-10-CM | POA: Diagnosis not present

## 2019-03-08 DIAGNOSIS — D631 Anemia in chronic kidney disease: Secondary | ICD-10-CM | POA: Diagnosis not present

## 2019-03-10 DIAGNOSIS — N186 End stage renal disease: Secondary | ICD-10-CM | POA: Diagnosis not present

## 2019-03-10 DIAGNOSIS — N008 Acute nephritic syndrome with other morphologic changes: Secondary | ICD-10-CM | POA: Diagnosis not present

## 2019-03-10 DIAGNOSIS — D631 Anemia in chronic kidney disease: Secondary | ICD-10-CM | POA: Diagnosis not present

## 2019-03-10 DIAGNOSIS — N2581 Secondary hyperparathyroidism of renal origin: Secondary | ICD-10-CM | POA: Diagnosis not present

## 2019-03-10 DIAGNOSIS — Z992 Dependence on renal dialysis: Secondary | ICD-10-CM | POA: Diagnosis not present

## 2019-03-10 DIAGNOSIS — D509 Iron deficiency anemia, unspecified: Secondary | ICD-10-CM | POA: Diagnosis not present

## 2019-03-12 DIAGNOSIS — D509 Iron deficiency anemia, unspecified: Secondary | ICD-10-CM | POA: Diagnosis not present

## 2019-03-12 DIAGNOSIS — N2581 Secondary hyperparathyroidism of renal origin: Secondary | ICD-10-CM | POA: Diagnosis not present

## 2019-03-12 DIAGNOSIS — D631 Anemia in chronic kidney disease: Secondary | ICD-10-CM | POA: Diagnosis not present

## 2019-03-12 DIAGNOSIS — N186 End stage renal disease: Secondary | ICD-10-CM | POA: Diagnosis not present

## 2019-03-15 DIAGNOSIS — N186 End stage renal disease: Secondary | ICD-10-CM | POA: Diagnosis not present

## 2019-03-15 DIAGNOSIS — N2581 Secondary hyperparathyroidism of renal origin: Secondary | ICD-10-CM | POA: Diagnosis not present

## 2019-03-15 DIAGNOSIS — D509 Iron deficiency anemia, unspecified: Secondary | ICD-10-CM | POA: Diagnosis not present

## 2019-03-15 DIAGNOSIS — D631 Anemia in chronic kidney disease: Secondary | ICD-10-CM | POA: Diagnosis not present

## 2019-03-17 DIAGNOSIS — N2581 Secondary hyperparathyroidism of renal origin: Secondary | ICD-10-CM | POA: Diagnosis not present

## 2019-03-17 DIAGNOSIS — D631 Anemia in chronic kidney disease: Secondary | ICD-10-CM | POA: Diagnosis not present

## 2019-03-17 DIAGNOSIS — N186 End stage renal disease: Secondary | ICD-10-CM | POA: Diagnosis not present

## 2019-03-17 DIAGNOSIS — D509 Iron deficiency anemia, unspecified: Secondary | ICD-10-CM | POA: Diagnosis not present

## 2019-03-19 DIAGNOSIS — N186 End stage renal disease: Secondary | ICD-10-CM | POA: Diagnosis not present

## 2019-03-19 DIAGNOSIS — D509 Iron deficiency anemia, unspecified: Secondary | ICD-10-CM | POA: Diagnosis not present

## 2019-03-19 DIAGNOSIS — N2581 Secondary hyperparathyroidism of renal origin: Secondary | ICD-10-CM | POA: Diagnosis not present

## 2019-03-19 DIAGNOSIS — D631 Anemia in chronic kidney disease: Secondary | ICD-10-CM | POA: Diagnosis not present

## 2019-03-22 DIAGNOSIS — N186 End stage renal disease: Secondary | ICD-10-CM | POA: Diagnosis not present

## 2019-03-22 DIAGNOSIS — N2581 Secondary hyperparathyroidism of renal origin: Secondary | ICD-10-CM | POA: Diagnosis not present

## 2019-03-22 DIAGNOSIS — D631 Anemia in chronic kidney disease: Secondary | ICD-10-CM | POA: Diagnosis not present

## 2019-03-22 DIAGNOSIS — D509 Iron deficiency anemia, unspecified: Secondary | ICD-10-CM | POA: Diagnosis not present

## 2019-03-24 DIAGNOSIS — D509 Iron deficiency anemia, unspecified: Secondary | ICD-10-CM | POA: Diagnosis not present

## 2019-03-24 DIAGNOSIS — D631 Anemia in chronic kidney disease: Secondary | ICD-10-CM | POA: Diagnosis not present

## 2019-03-24 DIAGNOSIS — N186 End stage renal disease: Secondary | ICD-10-CM | POA: Diagnosis not present

## 2019-03-24 DIAGNOSIS — N2581 Secondary hyperparathyroidism of renal origin: Secondary | ICD-10-CM | POA: Diagnosis not present

## 2019-03-26 DIAGNOSIS — N2581 Secondary hyperparathyroidism of renal origin: Secondary | ICD-10-CM | POA: Diagnosis not present

## 2019-03-26 DIAGNOSIS — N186 End stage renal disease: Secondary | ICD-10-CM | POA: Diagnosis not present

## 2019-03-26 DIAGNOSIS — D631 Anemia in chronic kidney disease: Secondary | ICD-10-CM | POA: Diagnosis not present

## 2019-03-26 DIAGNOSIS — D509 Iron deficiency anemia, unspecified: Secondary | ICD-10-CM | POA: Diagnosis not present

## 2019-03-29 DIAGNOSIS — N2581 Secondary hyperparathyroidism of renal origin: Secondary | ICD-10-CM | POA: Diagnosis not present

## 2019-03-29 DIAGNOSIS — D631 Anemia in chronic kidney disease: Secondary | ICD-10-CM | POA: Diagnosis not present

## 2019-03-29 DIAGNOSIS — N186 End stage renal disease: Secondary | ICD-10-CM | POA: Diagnosis not present

## 2019-03-29 DIAGNOSIS — D509 Iron deficiency anemia, unspecified: Secondary | ICD-10-CM | POA: Diagnosis not present

## 2019-03-31 DIAGNOSIS — N186 End stage renal disease: Secondary | ICD-10-CM | POA: Diagnosis not present

## 2019-03-31 DIAGNOSIS — D509 Iron deficiency anemia, unspecified: Secondary | ICD-10-CM | POA: Diagnosis not present

## 2019-03-31 DIAGNOSIS — D631 Anemia in chronic kidney disease: Secondary | ICD-10-CM | POA: Diagnosis not present

## 2019-03-31 DIAGNOSIS — N2581 Secondary hyperparathyroidism of renal origin: Secondary | ICD-10-CM | POA: Diagnosis not present

## 2019-04-02 DIAGNOSIS — N2581 Secondary hyperparathyroidism of renal origin: Secondary | ICD-10-CM | POA: Diagnosis not present

## 2019-04-02 DIAGNOSIS — D509 Iron deficiency anemia, unspecified: Secondary | ICD-10-CM | POA: Diagnosis not present

## 2019-04-02 DIAGNOSIS — D631 Anemia in chronic kidney disease: Secondary | ICD-10-CM | POA: Diagnosis not present

## 2019-04-02 DIAGNOSIS — N186 End stage renal disease: Secondary | ICD-10-CM | POA: Diagnosis not present

## 2019-04-05 DIAGNOSIS — N2581 Secondary hyperparathyroidism of renal origin: Secondary | ICD-10-CM | POA: Diagnosis not present

## 2019-04-05 DIAGNOSIS — D631 Anemia in chronic kidney disease: Secondary | ICD-10-CM | POA: Diagnosis not present

## 2019-04-05 DIAGNOSIS — N186 End stage renal disease: Secondary | ICD-10-CM | POA: Diagnosis not present

## 2019-04-05 DIAGNOSIS — D509 Iron deficiency anemia, unspecified: Secondary | ICD-10-CM | POA: Diagnosis not present

## 2019-04-07 DIAGNOSIS — D509 Iron deficiency anemia, unspecified: Secondary | ICD-10-CM | POA: Diagnosis not present

## 2019-04-07 DIAGNOSIS — D631 Anemia in chronic kidney disease: Secondary | ICD-10-CM | POA: Diagnosis not present

## 2019-04-07 DIAGNOSIS — N186 End stage renal disease: Secondary | ICD-10-CM | POA: Diagnosis not present

## 2019-04-07 DIAGNOSIS — N2581 Secondary hyperparathyroidism of renal origin: Secondary | ICD-10-CM | POA: Diagnosis not present

## 2019-04-09 DIAGNOSIS — N008 Acute nephritic syndrome with other morphologic changes: Secondary | ICD-10-CM | POA: Diagnosis not present

## 2019-04-09 DIAGNOSIS — N186 End stage renal disease: Secondary | ICD-10-CM | POA: Diagnosis not present

## 2019-04-09 DIAGNOSIS — N2581 Secondary hyperparathyroidism of renal origin: Secondary | ICD-10-CM | POA: Diagnosis not present

## 2019-04-09 DIAGNOSIS — D631 Anemia in chronic kidney disease: Secondary | ICD-10-CM | POA: Diagnosis not present

## 2019-04-09 DIAGNOSIS — Z992 Dependence on renal dialysis: Secondary | ICD-10-CM | POA: Diagnosis not present

## 2019-04-12 DIAGNOSIS — N186 End stage renal disease: Secondary | ICD-10-CM | POA: Diagnosis not present

## 2019-04-12 DIAGNOSIS — N2581 Secondary hyperparathyroidism of renal origin: Secondary | ICD-10-CM | POA: Diagnosis not present

## 2019-04-12 DIAGNOSIS — D631 Anemia in chronic kidney disease: Secondary | ICD-10-CM | POA: Diagnosis not present

## 2019-04-14 DIAGNOSIS — N2581 Secondary hyperparathyroidism of renal origin: Secondary | ICD-10-CM | POA: Diagnosis not present

## 2019-04-14 DIAGNOSIS — D631 Anemia in chronic kidney disease: Secondary | ICD-10-CM | POA: Diagnosis not present

## 2019-04-14 DIAGNOSIS — N186 End stage renal disease: Secondary | ICD-10-CM | POA: Diagnosis not present

## 2019-04-16 DIAGNOSIS — D631 Anemia in chronic kidney disease: Secondary | ICD-10-CM | POA: Diagnosis not present

## 2019-04-16 DIAGNOSIS — N186 End stage renal disease: Secondary | ICD-10-CM | POA: Diagnosis not present

## 2019-04-16 DIAGNOSIS — N2581 Secondary hyperparathyroidism of renal origin: Secondary | ICD-10-CM | POA: Diagnosis not present

## 2019-04-19 DIAGNOSIS — N2581 Secondary hyperparathyroidism of renal origin: Secondary | ICD-10-CM | POA: Diagnosis not present

## 2019-04-19 DIAGNOSIS — N186 End stage renal disease: Secondary | ICD-10-CM | POA: Diagnosis not present

## 2019-04-19 DIAGNOSIS — D631 Anemia in chronic kidney disease: Secondary | ICD-10-CM | POA: Diagnosis not present

## 2019-04-21 DIAGNOSIS — N186 End stage renal disease: Secondary | ICD-10-CM | POA: Diagnosis not present

## 2019-04-21 DIAGNOSIS — N2581 Secondary hyperparathyroidism of renal origin: Secondary | ICD-10-CM | POA: Diagnosis not present

## 2019-04-21 DIAGNOSIS — D631 Anemia in chronic kidney disease: Secondary | ICD-10-CM | POA: Diagnosis not present

## 2019-04-23 DIAGNOSIS — N186 End stage renal disease: Secondary | ICD-10-CM | POA: Diagnosis not present

## 2019-04-23 DIAGNOSIS — N2581 Secondary hyperparathyroidism of renal origin: Secondary | ICD-10-CM | POA: Diagnosis not present

## 2019-04-23 DIAGNOSIS — D631 Anemia in chronic kidney disease: Secondary | ICD-10-CM | POA: Diagnosis not present

## 2019-04-26 DIAGNOSIS — N186 End stage renal disease: Secondary | ICD-10-CM | POA: Diagnosis not present

## 2019-04-26 DIAGNOSIS — N2581 Secondary hyperparathyroidism of renal origin: Secondary | ICD-10-CM | POA: Diagnosis not present

## 2019-04-26 DIAGNOSIS — D631 Anemia in chronic kidney disease: Secondary | ICD-10-CM | POA: Diagnosis not present

## 2019-04-28 DIAGNOSIS — N2581 Secondary hyperparathyroidism of renal origin: Secondary | ICD-10-CM | POA: Diagnosis not present

## 2019-04-28 DIAGNOSIS — D631 Anemia in chronic kidney disease: Secondary | ICD-10-CM | POA: Diagnosis not present

## 2019-04-28 DIAGNOSIS — N186 End stage renal disease: Secondary | ICD-10-CM | POA: Diagnosis not present

## 2019-04-30 DIAGNOSIS — N2581 Secondary hyperparathyroidism of renal origin: Secondary | ICD-10-CM | POA: Diagnosis not present

## 2019-04-30 DIAGNOSIS — N186 End stage renal disease: Secondary | ICD-10-CM | POA: Diagnosis not present

## 2019-04-30 DIAGNOSIS — D631 Anemia in chronic kidney disease: Secondary | ICD-10-CM | POA: Diagnosis not present

## 2019-05-03 DIAGNOSIS — D631 Anemia in chronic kidney disease: Secondary | ICD-10-CM | POA: Diagnosis not present

## 2019-05-03 DIAGNOSIS — N186 End stage renal disease: Secondary | ICD-10-CM | POA: Diagnosis not present

## 2019-05-03 DIAGNOSIS — N2581 Secondary hyperparathyroidism of renal origin: Secondary | ICD-10-CM | POA: Diagnosis not present

## 2019-05-05 DIAGNOSIS — D631 Anemia in chronic kidney disease: Secondary | ICD-10-CM | POA: Diagnosis not present

## 2019-05-05 DIAGNOSIS — N2581 Secondary hyperparathyroidism of renal origin: Secondary | ICD-10-CM | POA: Diagnosis not present

## 2019-05-05 DIAGNOSIS — N186 End stage renal disease: Secondary | ICD-10-CM | POA: Diagnosis not present

## 2019-05-07 DIAGNOSIS — N186 End stage renal disease: Secondary | ICD-10-CM | POA: Diagnosis not present

## 2019-05-07 DIAGNOSIS — D631 Anemia in chronic kidney disease: Secondary | ICD-10-CM | POA: Diagnosis not present

## 2019-05-07 DIAGNOSIS — N2581 Secondary hyperparathyroidism of renal origin: Secondary | ICD-10-CM | POA: Diagnosis not present

## 2019-05-10 DIAGNOSIS — N2581 Secondary hyperparathyroidism of renal origin: Secondary | ICD-10-CM | POA: Diagnosis not present

## 2019-05-10 DIAGNOSIS — Z992 Dependence on renal dialysis: Secondary | ICD-10-CM | POA: Diagnosis not present

## 2019-05-10 DIAGNOSIS — N186 End stage renal disease: Secondary | ICD-10-CM | POA: Diagnosis not present

## 2019-05-10 DIAGNOSIS — N008 Acute nephritic syndrome with other morphologic changes: Secondary | ICD-10-CM | POA: Diagnosis not present

## 2019-05-12 DIAGNOSIS — N2581 Secondary hyperparathyroidism of renal origin: Secondary | ICD-10-CM | POA: Diagnosis not present

## 2019-05-12 DIAGNOSIS — N186 End stage renal disease: Secondary | ICD-10-CM | POA: Diagnosis not present

## 2019-05-14 DIAGNOSIS — N2581 Secondary hyperparathyroidism of renal origin: Secondary | ICD-10-CM | POA: Diagnosis not present

## 2019-05-14 DIAGNOSIS — N186 End stage renal disease: Secondary | ICD-10-CM | POA: Diagnosis not present

## 2019-05-17 DIAGNOSIS — N186 End stage renal disease: Secondary | ICD-10-CM | POA: Diagnosis not present

## 2019-05-17 DIAGNOSIS — N2581 Secondary hyperparathyroidism of renal origin: Secondary | ICD-10-CM | POA: Diagnosis not present

## 2019-05-19 DIAGNOSIS — N2581 Secondary hyperparathyroidism of renal origin: Secondary | ICD-10-CM | POA: Diagnosis not present

## 2019-05-19 DIAGNOSIS — N186 End stage renal disease: Secondary | ICD-10-CM | POA: Diagnosis not present

## 2019-05-21 DIAGNOSIS — N186 End stage renal disease: Secondary | ICD-10-CM | POA: Diagnosis not present

## 2019-05-21 DIAGNOSIS — N2581 Secondary hyperparathyroidism of renal origin: Secondary | ICD-10-CM | POA: Diagnosis not present

## 2019-05-24 DIAGNOSIS — N2581 Secondary hyperparathyroidism of renal origin: Secondary | ICD-10-CM | POA: Diagnosis not present

## 2019-05-24 DIAGNOSIS — N186 End stage renal disease: Secondary | ICD-10-CM | POA: Diagnosis not present

## 2019-05-26 DIAGNOSIS — N2581 Secondary hyperparathyroidism of renal origin: Secondary | ICD-10-CM | POA: Diagnosis not present

## 2019-05-26 DIAGNOSIS — N186 End stage renal disease: Secondary | ICD-10-CM | POA: Diagnosis not present

## 2019-05-28 DIAGNOSIS — N2581 Secondary hyperparathyroidism of renal origin: Secondary | ICD-10-CM | POA: Diagnosis not present

## 2019-05-28 DIAGNOSIS — N186 End stage renal disease: Secondary | ICD-10-CM | POA: Diagnosis not present

## 2019-05-31 DIAGNOSIS — N2581 Secondary hyperparathyroidism of renal origin: Secondary | ICD-10-CM | POA: Diagnosis not present

## 2019-05-31 DIAGNOSIS — N186 End stage renal disease: Secondary | ICD-10-CM | POA: Diagnosis not present

## 2019-06-02 DIAGNOSIS — N186 End stage renal disease: Secondary | ICD-10-CM | POA: Diagnosis not present

## 2019-06-02 DIAGNOSIS — N2581 Secondary hyperparathyroidism of renal origin: Secondary | ICD-10-CM | POA: Diagnosis not present

## 2019-06-04 DIAGNOSIS — N186 End stage renal disease: Secondary | ICD-10-CM | POA: Diagnosis not present

## 2019-06-04 DIAGNOSIS — N2581 Secondary hyperparathyroidism of renal origin: Secondary | ICD-10-CM | POA: Diagnosis not present

## 2019-06-07 DIAGNOSIS — N2581 Secondary hyperparathyroidism of renal origin: Secondary | ICD-10-CM | POA: Diagnosis not present

## 2019-06-07 DIAGNOSIS — N186 End stage renal disease: Secondary | ICD-10-CM | POA: Diagnosis not present

## 2019-06-09 DIAGNOSIS — N008 Acute nephritic syndrome with other morphologic changes: Secondary | ICD-10-CM | POA: Diagnosis not present

## 2019-06-09 DIAGNOSIS — D631 Anemia in chronic kidney disease: Secondary | ICD-10-CM | POA: Diagnosis not present

## 2019-06-09 DIAGNOSIS — N2581 Secondary hyperparathyroidism of renal origin: Secondary | ICD-10-CM | POA: Diagnosis not present

## 2019-06-09 DIAGNOSIS — Z992 Dependence on renal dialysis: Secondary | ICD-10-CM | POA: Diagnosis not present

## 2019-06-09 DIAGNOSIS — N186 End stage renal disease: Secondary | ICD-10-CM | POA: Diagnosis not present

## 2019-06-11 DIAGNOSIS — N186 End stage renal disease: Secondary | ICD-10-CM | POA: Diagnosis not present

## 2019-06-11 DIAGNOSIS — D631 Anemia in chronic kidney disease: Secondary | ICD-10-CM | POA: Diagnosis not present

## 2019-06-11 DIAGNOSIS — N2581 Secondary hyperparathyroidism of renal origin: Secondary | ICD-10-CM | POA: Diagnosis not present

## 2019-06-14 DIAGNOSIS — D631 Anemia in chronic kidney disease: Secondary | ICD-10-CM | POA: Diagnosis not present

## 2019-06-14 DIAGNOSIS — N2581 Secondary hyperparathyroidism of renal origin: Secondary | ICD-10-CM | POA: Diagnosis not present

## 2019-06-14 DIAGNOSIS — N186 End stage renal disease: Secondary | ICD-10-CM | POA: Diagnosis not present

## 2019-06-16 DIAGNOSIS — N186 End stage renal disease: Secondary | ICD-10-CM | POA: Diagnosis not present

## 2019-06-16 DIAGNOSIS — D631 Anemia in chronic kidney disease: Secondary | ICD-10-CM | POA: Diagnosis not present

## 2019-06-16 DIAGNOSIS — N2581 Secondary hyperparathyroidism of renal origin: Secondary | ICD-10-CM | POA: Diagnosis not present

## 2019-06-18 DIAGNOSIS — N186 End stage renal disease: Secondary | ICD-10-CM | POA: Diagnosis not present

## 2019-06-18 DIAGNOSIS — D631 Anemia in chronic kidney disease: Secondary | ICD-10-CM | POA: Diagnosis not present

## 2019-06-18 DIAGNOSIS — N2581 Secondary hyperparathyroidism of renal origin: Secondary | ICD-10-CM | POA: Diagnosis not present

## 2019-06-21 DIAGNOSIS — D631 Anemia in chronic kidney disease: Secondary | ICD-10-CM | POA: Diagnosis not present

## 2019-06-21 DIAGNOSIS — N186 End stage renal disease: Secondary | ICD-10-CM | POA: Diagnosis not present

## 2019-06-21 DIAGNOSIS — N2581 Secondary hyperparathyroidism of renal origin: Secondary | ICD-10-CM | POA: Diagnosis not present

## 2019-06-23 DIAGNOSIS — N2581 Secondary hyperparathyroidism of renal origin: Secondary | ICD-10-CM | POA: Diagnosis not present

## 2019-06-23 DIAGNOSIS — N186 End stage renal disease: Secondary | ICD-10-CM | POA: Diagnosis not present

## 2019-06-23 DIAGNOSIS — D631 Anemia in chronic kidney disease: Secondary | ICD-10-CM | POA: Diagnosis not present

## 2019-06-25 DIAGNOSIS — N2581 Secondary hyperparathyroidism of renal origin: Secondary | ICD-10-CM | POA: Diagnosis not present

## 2019-06-25 DIAGNOSIS — N186 End stage renal disease: Secondary | ICD-10-CM | POA: Diagnosis not present

## 2019-06-25 DIAGNOSIS — D631 Anemia in chronic kidney disease: Secondary | ICD-10-CM | POA: Diagnosis not present

## 2019-06-28 DIAGNOSIS — D631 Anemia in chronic kidney disease: Secondary | ICD-10-CM | POA: Diagnosis not present

## 2019-06-28 DIAGNOSIS — N186 End stage renal disease: Secondary | ICD-10-CM | POA: Diagnosis not present

## 2019-06-28 DIAGNOSIS — N2581 Secondary hyperparathyroidism of renal origin: Secondary | ICD-10-CM | POA: Diagnosis not present

## 2019-06-30 DIAGNOSIS — N186 End stage renal disease: Secondary | ICD-10-CM | POA: Diagnosis not present

## 2019-06-30 DIAGNOSIS — D631 Anemia in chronic kidney disease: Secondary | ICD-10-CM | POA: Diagnosis not present

## 2019-06-30 DIAGNOSIS — N2581 Secondary hyperparathyroidism of renal origin: Secondary | ICD-10-CM | POA: Diagnosis not present

## 2019-07-02 DIAGNOSIS — D631 Anemia in chronic kidney disease: Secondary | ICD-10-CM | POA: Diagnosis not present

## 2019-07-02 DIAGNOSIS — N2581 Secondary hyperparathyroidism of renal origin: Secondary | ICD-10-CM | POA: Diagnosis not present

## 2019-07-02 DIAGNOSIS — N186 End stage renal disease: Secondary | ICD-10-CM | POA: Diagnosis not present

## 2019-07-05 DIAGNOSIS — D631 Anemia in chronic kidney disease: Secondary | ICD-10-CM | POA: Diagnosis not present

## 2019-07-05 DIAGNOSIS — N186 End stage renal disease: Secondary | ICD-10-CM | POA: Diagnosis not present

## 2019-07-05 DIAGNOSIS — N2581 Secondary hyperparathyroidism of renal origin: Secondary | ICD-10-CM | POA: Diagnosis not present

## 2019-07-07 DIAGNOSIS — D631 Anemia in chronic kidney disease: Secondary | ICD-10-CM | POA: Diagnosis not present

## 2019-07-07 DIAGNOSIS — N2581 Secondary hyperparathyroidism of renal origin: Secondary | ICD-10-CM | POA: Diagnosis not present

## 2019-07-07 DIAGNOSIS — N186 End stage renal disease: Secondary | ICD-10-CM | POA: Diagnosis not present

## 2019-07-09 DIAGNOSIS — D631 Anemia in chronic kidney disease: Secondary | ICD-10-CM | POA: Diagnosis not present

## 2019-07-09 DIAGNOSIS — N186 End stage renal disease: Secondary | ICD-10-CM | POA: Diagnosis not present

## 2019-07-09 DIAGNOSIS — N2581 Secondary hyperparathyroidism of renal origin: Secondary | ICD-10-CM | POA: Diagnosis not present

## 2019-07-10 DIAGNOSIS — N186 End stage renal disease: Secondary | ICD-10-CM | POA: Diagnosis not present

## 2019-07-10 DIAGNOSIS — Z992 Dependence on renal dialysis: Secondary | ICD-10-CM | POA: Diagnosis not present

## 2019-07-10 DIAGNOSIS — N008 Acute nephritic syndrome with other morphologic changes: Secondary | ICD-10-CM | POA: Diagnosis not present

## 2019-07-12 DIAGNOSIS — N2581 Secondary hyperparathyroidism of renal origin: Secondary | ICD-10-CM | POA: Diagnosis not present

## 2019-07-12 DIAGNOSIS — N186 End stage renal disease: Secondary | ICD-10-CM | POA: Diagnosis not present

## 2019-07-12 DIAGNOSIS — Z992 Dependence on renal dialysis: Secondary | ICD-10-CM | POA: Diagnosis not present

## 2019-07-12 DIAGNOSIS — D631 Anemia in chronic kidney disease: Secondary | ICD-10-CM | POA: Diagnosis not present

## 2019-07-14 DIAGNOSIS — Z992 Dependence on renal dialysis: Secondary | ICD-10-CM | POA: Diagnosis not present

## 2019-07-14 DIAGNOSIS — N2581 Secondary hyperparathyroidism of renal origin: Secondary | ICD-10-CM | POA: Diagnosis not present

## 2019-07-14 DIAGNOSIS — D631 Anemia in chronic kidney disease: Secondary | ICD-10-CM | POA: Diagnosis not present

## 2019-07-14 DIAGNOSIS — N186 End stage renal disease: Secondary | ICD-10-CM | POA: Diagnosis not present

## 2019-07-16 DIAGNOSIS — N2581 Secondary hyperparathyroidism of renal origin: Secondary | ICD-10-CM | POA: Diagnosis not present

## 2019-07-16 DIAGNOSIS — Z992 Dependence on renal dialysis: Secondary | ICD-10-CM | POA: Diagnosis not present

## 2019-07-16 DIAGNOSIS — N186 End stage renal disease: Secondary | ICD-10-CM | POA: Diagnosis not present

## 2019-07-16 DIAGNOSIS — D631 Anemia in chronic kidney disease: Secondary | ICD-10-CM | POA: Diagnosis not present

## 2019-07-19 DIAGNOSIS — Z992 Dependence on renal dialysis: Secondary | ICD-10-CM | POA: Diagnosis not present

## 2019-07-19 DIAGNOSIS — D631 Anemia in chronic kidney disease: Secondary | ICD-10-CM | POA: Diagnosis not present

## 2019-07-19 DIAGNOSIS — N186 End stage renal disease: Secondary | ICD-10-CM | POA: Diagnosis not present

## 2019-07-19 DIAGNOSIS — N2581 Secondary hyperparathyroidism of renal origin: Secondary | ICD-10-CM | POA: Diagnosis not present

## 2019-07-21 DIAGNOSIS — D631 Anemia in chronic kidney disease: Secondary | ICD-10-CM | POA: Diagnosis not present

## 2019-07-21 DIAGNOSIS — Z992 Dependence on renal dialysis: Secondary | ICD-10-CM | POA: Diagnosis not present

## 2019-07-21 DIAGNOSIS — N186 End stage renal disease: Secondary | ICD-10-CM | POA: Diagnosis not present

## 2019-07-21 DIAGNOSIS — N2581 Secondary hyperparathyroidism of renal origin: Secondary | ICD-10-CM | POA: Diagnosis not present

## 2019-07-23 DIAGNOSIS — N186 End stage renal disease: Secondary | ICD-10-CM | POA: Diagnosis not present

## 2019-07-23 DIAGNOSIS — N2581 Secondary hyperparathyroidism of renal origin: Secondary | ICD-10-CM | POA: Diagnosis not present

## 2019-07-23 DIAGNOSIS — D631 Anemia in chronic kidney disease: Secondary | ICD-10-CM | POA: Diagnosis not present

## 2019-07-23 DIAGNOSIS — Z992 Dependence on renal dialysis: Secondary | ICD-10-CM | POA: Diagnosis not present

## 2019-07-26 DIAGNOSIS — N186 End stage renal disease: Secondary | ICD-10-CM | POA: Diagnosis not present

## 2019-07-26 DIAGNOSIS — N2581 Secondary hyperparathyroidism of renal origin: Secondary | ICD-10-CM | POA: Diagnosis not present

## 2019-07-26 DIAGNOSIS — Z992 Dependence on renal dialysis: Secondary | ICD-10-CM | POA: Diagnosis not present

## 2019-07-26 DIAGNOSIS — D631 Anemia in chronic kidney disease: Secondary | ICD-10-CM | POA: Diagnosis not present

## 2019-07-28 DIAGNOSIS — Z992 Dependence on renal dialysis: Secondary | ICD-10-CM | POA: Diagnosis not present

## 2019-07-28 DIAGNOSIS — D631 Anemia in chronic kidney disease: Secondary | ICD-10-CM | POA: Diagnosis not present

## 2019-07-28 DIAGNOSIS — N2581 Secondary hyperparathyroidism of renal origin: Secondary | ICD-10-CM | POA: Diagnosis not present

## 2019-07-28 DIAGNOSIS — N186 End stage renal disease: Secondary | ICD-10-CM | POA: Diagnosis not present

## 2019-07-30 DIAGNOSIS — D631 Anemia in chronic kidney disease: Secondary | ICD-10-CM | POA: Diagnosis not present

## 2019-07-30 DIAGNOSIS — N186 End stage renal disease: Secondary | ICD-10-CM | POA: Diagnosis not present

## 2019-07-30 DIAGNOSIS — Z992 Dependence on renal dialysis: Secondary | ICD-10-CM | POA: Diagnosis not present

## 2019-07-30 DIAGNOSIS — N2581 Secondary hyperparathyroidism of renal origin: Secondary | ICD-10-CM | POA: Diagnosis not present

## 2019-08-02 DIAGNOSIS — D631 Anemia in chronic kidney disease: Secondary | ICD-10-CM | POA: Diagnosis not present

## 2019-08-02 DIAGNOSIS — N2581 Secondary hyperparathyroidism of renal origin: Secondary | ICD-10-CM | POA: Diagnosis not present

## 2019-08-02 DIAGNOSIS — N186 End stage renal disease: Secondary | ICD-10-CM | POA: Diagnosis not present

## 2019-08-02 DIAGNOSIS — Z992 Dependence on renal dialysis: Secondary | ICD-10-CM | POA: Diagnosis not present

## 2019-08-04 DIAGNOSIS — Z992 Dependence on renal dialysis: Secondary | ICD-10-CM | POA: Diagnosis not present

## 2019-08-04 DIAGNOSIS — N2581 Secondary hyperparathyroidism of renal origin: Secondary | ICD-10-CM | POA: Diagnosis not present

## 2019-08-04 DIAGNOSIS — N186 End stage renal disease: Secondary | ICD-10-CM | POA: Diagnosis not present

## 2019-08-04 DIAGNOSIS — D631 Anemia in chronic kidney disease: Secondary | ICD-10-CM | POA: Diagnosis not present

## 2019-08-06 DIAGNOSIS — Z992 Dependence on renal dialysis: Secondary | ICD-10-CM | POA: Diagnosis not present

## 2019-08-06 DIAGNOSIS — N186 End stage renal disease: Secondary | ICD-10-CM | POA: Diagnosis not present

## 2019-08-06 DIAGNOSIS — N2581 Secondary hyperparathyroidism of renal origin: Secondary | ICD-10-CM | POA: Diagnosis not present

## 2019-08-06 DIAGNOSIS — D631 Anemia in chronic kidney disease: Secondary | ICD-10-CM | POA: Diagnosis not present

## 2019-08-09 DIAGNOSIS — N186 End stage renal disease: Secondary | ICD-10-CM | POA: Diagnosis not present

## 2019-08-09 DIAGNOSIS — D631 Anemia in chronic kidney disease: Secondary | ICD-10-CM | POA: Diagnosis not present

## 2019-08-09 DIAGNOSIS — Z992 Dependence on renal dialysis: Secondary | ICD-10-CM | POA: Diagnosis not present

## 2019-08-09 DIAGNOSIS — N2581 Secondary hyperparathyroidism of renal origin: Secondary | ICD-10-CM | POA: Diagnosis not present

## 2019-08-10 DIAGNOSIS — Z992 Dependence on renal dialysis: Secondary | ICD-10-CM | POA: Diagnosis not present

## 2019-08-10 DIAGNOSIS — N186 End stage renal disease: Secondary | ICD-10-CM | POA: Diagnosis not present

## 2019-08-10 DIAGNOSIS — N008 Acute nephritic syndrome with other morphologic changes: Secondary | ICD-10-CM | POA: Diagnosis not present

## 2019-08-11 DIAGNOSIS — Z23 Encounter for immunization: Secondary | ICD-10-CM | POA: Diagnosis not present

## 2019-08-11 DIAGNOSIS — Z992 Dependence on renal dialysis: Secondary | ICD-10-CM | POA: Diagnosis not present

## 2019-08-11 DIAGNOSIS — D631 Anemia in chronic kidney disease: Secondary | ICD-10-CM | POA: Diagnosis not present

## 2019-08-11 DIAGNOSIS — N186 End stage renal disease: Secondary | ICD-10-CM | POA: Diagnosis not present

## 2019-08-11 DIAGNOSIS — N2581 Secondary hyperparathyroidism of renal origin: Secondary | ICD-10-CM | POA: Diagnosis not present

## 2019-08-13 DIAGNOSIS — N186 End stage renal disease: Secondary | ICD-10-CM | POA: Diagnosis not present

## 2019-08-13 DIAGNOSIS — N2581 Secondary hyperparathyroidism of renal origin: Secondary | ICD-10-CM | POA: Diagnosis not present

## 2019-08-13 DIAGNOSIS — Z992 Dependence on renal dialysis: Secondary | ICD-10-CM | POA: Diagnosis not present

## 2019-08-13 DIAGNOSIS — D631 Anemia in chronic kidney disease: Secondary | ICD-10-CM | POA: Diagnosis not present

## 2019-08-13 DIAGNOSIS — Z23 Encounter for immunization: Secondary | ICD-10-CM | POA: Diagnosis not present

## 2019-08-16 DIAGNOSIS — N186 End stage renal disease: Secondary | ICD-10-CM | POA: Diagnosis not present

## 2019-08-16 DIAGNOSIS — Z992 Dependence on renal dialysis: Secondary | ICD-10-CM | POA: Diagnosis not present

## 2019-08-16 DIAGNOSIS — N2581 Secondary hyperparathyroidism of renal origin: Secondary | ICD-10-CM | POA: Diagnosis not present

## 2019-08-16 DIAGNOSIS — D631 Anemia in chronic kidney disease: Secondary | ICD-10-CM | POA: Diagnosis not present

## 2019-08-16 DIAGNOSIS — Z23 Encounter for immunization: Secondary | ICD-10-CM | POA: Diagnosis not present

## 2019-08-18 DIAGNOSIS — Z992 Dependence on renal dialysis: Secondary | ICD-10-CM | POA: Diagnosis not present

## 2019-08-18 DIAGNOSIS — N186 End stage renal disease: Secondary | ICD-10-CM | POA: Diagnosis not present

## 2019-08-18 DIAGNOSIS — N2581 Secondary hyperparathyroidism of renal origin: Secondary | ICD-10-CM | POA: Diagnosis not present

## 2019-08-18 DIAGNOSIS — Z23 Encounter for immunization: Secondary | ICD-10-CM | POA: Diagnosis not present

## 2019-08-18 DIAGNOSIS — D631 Anemia in chronic kidney disease: Secondary | ICD-10-CM | POA: Diagnosis not present

## 2019-08-20 DIAGNOSIS — Z23 Encounter for immunization: Secondary | ICD-10-CM | POA: Diagnosis not present

## 2019-08-20 DIAGNOSIS — Z992 Dependence on renal dialysis: Secondary | ICD-10-CM | POA: Diagnosis not present

## 2019-08-20 DIAGNOSIS — N2581 Secondary hyperparathyroidism of renal origin: Secondary | ICD-10-CM | POA: Diagnosis not present

## 2019-08-20 DIAGNOSIS — N186 End stage renal disease: Secondary | ICD-10-CM | POA: Diagnosis not present

## 2019-08-20 DIAGNOSIS — D631 Anemia in chronic kidney disease: Secondary | ICD-10-CM | POA: Diagnosis not present

## 2019-08-23 DIAGNOSIS — N186 End stage renal disease: Secondary | ICD-10-CM | POA: Diagnosis not present

## 2019-08-23 DIAGNOSIS — Z23 Encounter for immunization: Secondary | ICD-10-CM | POA: Diagnosis not present

## 2019-08-23 DIAGNOSIS — D631 Anemia in chronic kidney disease: Secondary | ICD-10-CM | POA: Diagnosis not present

## 2019-08-23 DIAGNOSIS — N2581 Secondary hyperparathyroidism of renal origin: Secondary | ICD-10-CM | POA: Diagnosis not present

## 2019-08-23 DIAGNOSIS — Z992 Dependence on renal dialysis: Secondary | ICD-10-CM | POA: Diagnosis not present

## 2019-08-25 DIAGNOSIS — Z23 Encounter for immunization: Secondary | ICD-10-CM | POA: Diagnosis not present

## 2019-08-25 DIAGNOSIS — D631 Anemia in chronic kidney disease: Secondary | ICD-10-CM | POA: Diagnosis not present

## 2019-08-25 DIAGNOSIS — N2581 Secondary hyperparathyroidism of renal origin: Secondary | ICD-10-CM | POA: Diagnosis not present

## 2019-08-25 DIAGNOSIS — N186 End stage renal disease: Secondary | ICD-10-CM | POA: Diagnosis not present

## 2019-08-25 DIAGNOSIS — Z992 Dependence on renal dialysis: Secondary | ICD-10-CM | POA: Diagnosis not present

## 2019-08-27 DIAGNOSIS — Z992 Dependence on renal dialysis: Secondary | ICD-10-CM | POA: Diagnosis not present

## 2019-08-27 DIAGNOSIS — N2581 Secondary hyperparathyroidism of renal origin: Secondary | ICD-10-CM | POA: Diagnosis not present

## 2019-08-27 DIAGNOSIS — D631 Anemia in chronic kidney disease: Secondary | ICD-10-CM | POA: Diagnosis not present

## 2019-08-27 DIAGNOSIS — N186 End stage renal disease: Secondary | ICD-10-CM | POA: Diagnosis not present

## 2019-08-27 DIAGNOSIS — Z23 Encounter for immunization: Secondary | ICD-10-CM | POA: Diagnosis not present

## 2019-08-30 DIAGNOSIS — R972 Elevated prostate specific antigen [PSA]: Secondary | ICD-10-CM | POA: Diagnosis not present

## 2019-08-30 DIAGNOSIS — D631 Anemia in chronic kidney disease: Secondary | ICD-10-CM | POA: Diagnosis not present

## 2019-08-30 DIAGNOSIS — N2581 Secondary hyperparathyroidism of renal origin: Secondary | ICD-10-CM | POA: Diagnosis not present

## 2019-08-30 DIAGNOSIS — Z23 Encounter for immunization: Secondary | ICD-10-CM | POA: Diagnosis not present

## 2019-08-30 DIAGNOSIS — Z992 Dependence on renal dialysis: Secondary | ICD-10-CM | POA: Diagnosis not present

## 2019-08-30 DIAGNOSIS — N186 End stage renal disease: Secondary | ICD-10-CM | POA: Diagnosis not present

## 2019-09-01 DIAGNOSIS — Z23 Encounter for immunization: Secondary | ICD-10-CM | POA: Diagnosis not present

## 2019-09-01 DIAGNOSIS — N2581 Secondary hyperparathyroidism of renal origin: Secondary | ICD-10-CM | POA: Diagnosis not present

## 2019-09-01 DIAGNOSIS — Z992 Dependence on renal dialysis: Secondary | ICD-10-CM | POA: Diagnosis not present

## 2019-09-01 DIAGNOSIS — D631 Anemia in chronic kidney disease: Secondary | ICD-10-CM | POA: Diagnosis not present

## 2019-09-01 DIAGNOSIS — N186 End stage renal disease: Secondary | ICD-10-CM | POA: Diagnosis not present

## 2019-09-03 DIAGNOSIS — Z992 Dependence on renal dialysis: Secondary | ICD-10-CM | POA: Diagnosis not present

## 2019-09-03 DIAGNOSIS — Z23 Encounter for immunization: Secondary | ICD-10-CM | POA: Diagnosis not present

## 2019-09-03 DIAGNOSIS — N186 End stage renal disease: Secondary | ICD-10-CM | POA: Diagnosis not present

## 2019-09-03 DIAGNOSIS — N2581 Secondary hyperparathyroidism of renal origin: Secondary | ICD-10-CM | POA: Diagnosis not present

## 2019-09-03 DIAGNOSIS — D631 Anemia in chronic kidney disease: Secondary | ICD-10-CM | POA: Diagnosis not present

## 2019-09-06 DIAGNOSIS — Z23 Encounter for immunization: Secondary | ICD-10-CM | POA: Diagnosis not present

## 2019-09-06 DIAGNOSIS — Z992 Dependence on renal dialysis: Secondary | ICD-10-CM | POA: Diagnosis not present

## 2019-09-06 DIAGNOSIS — N2581 Secondary hyperparathyroidism of renal origin: Secondary | ICD-10-CM | POA: Diagnosis not present

## 2019-09-06 DIAGNOSIS — N186 End stage renal disease: Secondary | ICD-10-CM | POA: Diagnosis not present

## 2019-09-06 DIAGNOSIS — D631 Anemia in chronic kidney disease: Secondary | ICD-10-CM | POA: Diagnosis not present

## 2019-09-08 DIAGNOSIS — N186 End stage renal disease: Secondary | ICD-10-CM | POA: Diagnosis not present

## 2019-09-08 DIAGNOSIS — Z992 Dependence on renal dialysis: Secondary | ICD-10-CM | POA: Diagnosis not present

## 2019-09-08 DIAGNOSIS — Z23 Encounter for immunization: Secondary | ICD-10-CM | POA: Diagnosis not present

## 2019-09-08 DIAGNOSIS — D631 Anemia in chronic kidney disease: Secondary | ICD-10-CM | POA: Diagnosis not present

## 2019-09-08 DIAGNOSIS — N2581 Secondary hyperparathyroidism of renal origin: Secondary | ICD-10-CM | POA: Diagnosis not present

## 2019-09-09 DIAGNOSIS — N008 Acute nephritic syndrome with other morphologic changes: Secondary | ICD-10-CM | POA: Diagnosis not present

## 2019-09-09 DIAGNOSIS — N186 End stage renal disease: Secondary | ICD-10-CM | POA: Diagnosis not present

## 2019-09-09 DIAGNOSIS — Z992 Dependence on renal dialysis: Secondary | ICD-10-CM | POA: Diagnosis not present

## 2019-09-10 DIAGNOSIS — D509 Iron deficiency anemia, unspecified: Secondary | ICD-10-CM | POA: Diagnosis not present

## 2019-09-10 DIAGNOSIS — N2581 Secondary hyperparathyroidism of renal origin: Secondary | ICD-10-CM | POA: Diagnosis not present

## 2019-09-10 DIAGNOSIS — D631 Anemia in chronic kidney disease: Secondary | ICD-10-CM | POA: Diagnosis not present

## 2019-09-10 DIAGNOSIS — Z992 Dependence on renal dialysis: Secondary | ICD-10-CM | POA: Diagnosis not present

## 2019-09-10 DIAGNOSIS — N186 End stage renal disease: Secondary | ICD-10-CM | POA: Diagnosis not present

## 2019-09-13 DIAGNOSIS — Z992 Dependence on renal dialysis: Secondary | ICD-10-CM | POA: Diagnosis not present

## 2019-09-13 DIAGNOSIS — D631 Anemia in chronic kidney disease: Secondary | ICD-10-CM | POA: Diagnosis not present

## 2019-09-13 DIAGNOSIS — N2581 Secondary hyperparathyroidism of renal origin: Secondary | ICD-10-CM | POA: Diagnosis not present

## 2019-09-13 DIAGNOSIS — D509 Iron deficiency anemia, unspecified: Secondary | ICD-10-CM | POA: Diagnosis not present

## 2019-09-13 DIAGNOSIS — N186 End stage renal disease: Secondary | ICD-10-CM | POA: Diagnosis not present

## 2019-09-15 DIAGNOSIS — Z992 Dependence on renal dialysis: Secondary | ICD-10-CM | POA: Diagnosis not present

## 2019-09-15 DIAGNOSIS — N186 End stage renal disease: Secondary | ICD-10-CM | POA: Diagnosis not present

## 2019-09-15 DIAGNOSIS — N2581 Secondary hyperparathyroidism of renal origin: Secondary | ICD-10-CM | POA: Diagnosis not present

## 2019-09-15 DIAGNOSIS — D509 Iron deficiency anemia, unspecified: Secondary | ICD-10-CM | POA: Diagnosis not present

## 2019-09-15 DIAGNOSIS — D631 Anemia in chronic kidney disease: Secondary | ICD-10-CM | POA: Diagnosis not present

## 2019-09-17 DIAGNOSIS — N186 End stage renal disease: Secondary | ICD-10-CM | POA: Diagnosis not present

## 2019-09-17 DIAGNOSIS — N2581 Secondary hyperparathyroidism of renal origin: Secondary | ICD-10-CM | POA: Diagnosis not present

## 2019-09-17 DIAGNOSIS — D509 Iron deficiency anemia, unspecified: Secondary | ICD-10-CM | POA: Diagnosis not present

## 2019-09-17 DIAGNOSIS — Z992 Dependence on renal dialysis: Secondary | ICD-10-CM | POA: Diagnosis not present

## 2019-09-17 DIAGNOSIS — D631 Anemia in chronic kidney disease: Secondary | ICD-10-CM | POA: Diagnosis not present

## 2019-09-20 DIAGNOSIS — N2581 Secondary hyperparathyroidism of renal origin: Secondary | ICD-10-CM | POA: Diagnosis not present

## 2019-09-20 DIAGNOSIS — N186 End stage renal disease: Secondary | ICD-10-CM | POA: Diagnosis not present

## 2019-09-20 DIAGNOSIS — Z992 Dependence on renal dialysis: Secondary | ICD-10-CM | POA: Diagnosis not present

## 2019-09-20 DIAGNOSIS — D631 Anemia in chronic kidney disease: Secondary | ICD-10-CM | POA: Diagnosis not present

## 2019-09-20 DIAGNOSIS — D509 Iron deficiency anemia, unspecified: Secondary | ICD-10-CM | POA: Diagnosis not present

## 2019-09-22 DIAGNOSIS — D631 Anemia in chronic kidney disease: Secondary | ICD-10-CM | POA: Diagnosis not present

## 2019-09-22 DIAGNOSIS — N2581 Secondary hyperparathyroidism of renal origin: Secondary | ICD-10-CM | POA: Diagnosis not present

## 2019-09-22 DIAGNOSIS — Z992 Dependence on renal dialysis: Secondary | ICD-10-CM | POA: Diagnosis not present

## 2019-09-22 DIAGNOSIS — N186 End stage renal disease: Secondary | ICD-10-CM | POA: Diagnosis not present

## 2019-09-22 DIAGNOSIS — D509 Iron deficiency anemia, unspecified: Secondary | ICD-10-CM | POA: Diagnosis not present

## 2019-09-24 DIAGNOSIS — N186 End stage renal disease: Secondary | ICD-10-CM | POA: Diagnosis not present

## 2019-09-24 DIAGNOSIS — N2581 Secondary hyperparathyroidism of renal origin: Secondary | ICD-10-CM | POA: Diagnosis not present

## 2019-09-24 DIAGNOSIS — D509 Iron deficiency anemia, unspecified: Secondary | ICD-10-CM | POA: Diagnosis not present

## 2019-09-24 DIAGNOSIS — D631 Anemia in chronic kidney disease: Secondary | ICD-10-CM | POA: Diagnosis not present

## 2019-09-24 DIAGNOSIS — Z992 Dependence on renal dialysis: Secondary | ICD-10-CM | POA: Diagnosis not present

## 2019-09-27 DIAGNOSIS — Z992 Dependence on renal dialysis: Secondary | ICD-10-CM | POA: Diagnosis not present

## 2019-09-27 DIAGNOSIS — D509 Iron deficiency anemia, unspecified: Secondary | ICD-10-CM | POA: Diagnosis not present

## 2019-09-27 DIAGNOSIS — N186 End stage renal disease: Secondary | ICD-10-CM | POA: Diagnosis not present

## 2019-09-27 DIAGNOSIS — D631 Anemia in chronic kidney disease: Secondary | ICD-10-CM | POA: Diagnosis not present

## 2019-09-27 DIAGNOSIS — N2581 Secondary hyperparathyroidism of renal origin: Secondary | ICD-10-CM | POA: Diagnosis not present

## 2019-09-29 DIAGNOSIS — D509 Iron deficiency anemia, unspecified: Secondary | ICD-10-CM | POA: Diagnosis not present

## 2019-09-29 DIAGNOSIS — Z992 Dependence on renal dialysis: Secondary | ICD-10-CM | POA: Diagnosis not present

## 2019-09-29 DIAGNOSIS — N186 End stage renal disease: Secondary | ICD-10-CM | POA: Diagnosis not present

## 2019-09-29 DIAGNOSIS — N2581 Secondary hyperparathyroidism of renal origin: Secondary | ICD-10-CM | POA: Diagnosis not present

## 2019-09-29 DIAGNOSIS — D631 Anemia in chronic kidney disease: Secondary | ICD-10-CM | POA: Diagnosis not present

## 2019-10-01 DIAGNOSIS — Z992 Dependence on renal dialysis: Secondary | ICD-10-CM | POA: Diagnosis not present

## 2019-10-01 DIAGNOSIS — N186 End stage renal disease: Secondary | ICD-10-CM | POA: Diagnosis not present

## 2019-10-01 DIAGNOSIS — D631 Anemia in chronic kidney disease: Secondary | ICD-10-CM | POA: Diagnosis not present

## 2019-10-01 DIAGNOSIS — D509 Iron deficiency anemia, unspecified: Secondary | ICD-10-CM | POA: Diagnosis not present

## 2019-10-01 DIAGNOSIS — N2581 Secondary hyperparathyroidism of renal origin: Secondary | ICD-10-CM | POA: Diagnosis not present

## 2019-10-04 DIAGNOSIS — D509 Iron deficiency anemia, unspecified: Secondary | ICD-10-CM | POA: Diagnosis not present

## 2019-10-04 DIAGNOSIS — D631 Anemia in chronic kidney disease: Secondary | ICD-10-CM | POA: Diagnosis not present

## 2019-10-04 DIAGNOSIS — Z992 Dependence on renal dialysis: Secondary | ICD-10-CM | POA: Diagnosis not present

## 2019-10-04 DIAGNOSIS — N186 End stage renal disease: Secondary | ICD-10-CM | POA: Diagnosis not present

## 2019-10-04 DIAGNOSIS — N2581 Secondary hyperparathyroidism of renal origin: Secondary | ICD-10-CM | POA: Diagnosis not present

## 2019-10-06 DIAGNOSIS — N2581 Secondary hyperparathyroidism of renal origin: Secondary | ICD-10-CM | POA: Diagnosis not present

## 2019-10-06 DIAGNOSIS — D631 Anemia in chronic kidney disease: Secondary | ICD-10-CM | POA: Diagnosis not present

## 2019-10-06 DIAGNOSIS — N186 End stage renal disease: Secondary | ICD-10-CM | POA: Diagnosis not present

## 2019-10-06 DIAGNOSIS — D509 Iron deficiency anemia, unspecified: Secondary | ICD-10-CM | POA: Diagnosis not present

## 2019-10-06 DIAGNOSIS — Z992 Dependence on renal dialysis: Secondary | ICD-10-CM | POA: Diagnosis not present

## 2019-10-08 DIAGNOSIS — Z992 Dependence on renal dialysis: Secondary | ICD-10-CM | POA: Diagnosis not present

## 2019-10-08 DIAGNOSIS — N2581 Secondary hyperparathyroidism of renal origin: Secondary | ICD-10-CM | POA: Diagnosis not present

## 2019-10-08 DIAGNOSIS — D631 Anemia in chronic kidney disease: Secondary | ICD-10-CM | POA: Diagnosis not present

## 2019-10-08 DIAGNOSIS — N186 End stage renal disease: Secondary | ICD-10-CM | POA: Diagnosis not present

## 2019-10-08 DIAGNOSIS — D509 Iron deficiency anemia, unspecified: Secondary | ICD-10-CM | POA: Diagnosis not present

## 2019-10-10 DIAGNOSIS — N186 End stage renal disease: Secondary | ICD-10-CM | POA: Diagnosis not present

## 2019-10-10 DIAGNOSIS — Z992 Dependence on renal dialysis: Secondary | ICD-10-CM | POA: Diagnosis not present

## 2019-10-10 DIAGNOSIS — N008 Acute nephritic syndrome with other morphologic changes: Secondary | ICD-10-CM | POA: Diagnosis not present

## 2019-10-11 DIAGNOSIS — D631 Anemia in chronic kidney disease: Secondary | ICD-10-CM | POA: Diagnosis not present

## 2019-10-11 DIAGNOSIS — Z992 Dependence on renal dialysis: Secondary | ICD-10-CM | POA: Diagnosis not present

## 2019-10-11 DIAGNOSIS — N2581 Secondary hyperparathyroidism of renal origin: Secondary | ICD-10-CM | POA: Diagnosis not present

## 2019-10-11 DIAGNOSIS — D509 Iron deficiency anemia, unspecified: Secondary | ICD-10-CM | POA: Diagnosis not present

## 2019-10-11 DIAGNOSIS — N186 End stage renal disease: Secondary | ICD-10-CM | POA: Diagnosis not present

## 2019-10-13 DIAGNOSIS — N186 End stage renal disease: Secondary | ICD-10-CM | POA: Diagnosis not present

## 2019-10-13 DIAGNOSIS — D509 Iron deficiency anemia, unspecified: Secondary | ICD-10-CM | POA: Diagnosis not present

## 2019-10-13 DIAGNOSIS — Z992 Dependence on renal dialysis: Secondary | ICD-10-CM | POA: Diagnosis not present

## 2019-10-13 DIAGNOSIS — D631 Anemia in chronic kidney disease: Secondary | ICD-10-CM | POA: Diagnosis not present

## 2019-10-13 DIAGNOSIS — N2581 Secondary hyperparathyroidism of renal origin: Secondary | ICD-10-CM | POA: Diagnosis not present

## 2019-10-15 DIAGNOSIS — N186 End stage renal disease: Secondary | ICD-10-CM | POA: Diagnosis not present

## 2019-10-15 DIAGNOSIS — N2581 Secondary hyperparathyroidism of renal origin: Secondary | ICD-10-CM | POA: Diagnosis not present

## 2019-10-15 DIAGNOSIS — D509 Iron deficiency anemia, unspecified: Secondary | ICD-10-CM | POA: Diagnosis not present

## 2019-10-15 DIAGNOSIS — D631 Anemia in chronic kidney disease: Secondary | ICD-10-CM | POA: Diagnosis not present

## 2019-10-15 DIAGNOSIS — Z992 Dependence on renal dialysis: Secondary | ICD-10-CM | POA: Diagnosis not present

## 2019-10-18 DIAGNOSIS — D631 Anemia in chronic kidney disease: Secondary | ICD-10-CM | POA: Diagnosis not present

## 2019-10-18 DIAGNOSIS — Z992 Dependence on renal dialysis: Secondary | ICD-10-CM | POA: Diagnosis not present

## 2019-10-18 DIAGNOSIS — N2581 Secondary hyperparathyroidism of renal origin: Secondary | ICD-10-CM | POA: Diagnosis not present

## 2019-10-18 DIAGNOSIS — N186 End stage renal disease: Secondary | ICD-10-CM | POA: Diagnosis not present

## 2019-10-18 DIAGNOSIS — D509 Iron deficiency anemia, unspecified: Secondary | ICD-10-CM | POA: Diagnosis not present

## 2019-10-20 DIAGNOSIS — N186 End stage renal disease: Secondary | ICD-10-CM | POA: Diagnosis not present

## 2019-10-20 DIAGNOSIS — Z992 Dependence on renal dialysis: Secondary | ICD-10-CM | POA: Diagnosis not present

## 2019-10-20 DIAGNOSIS — D509 Iron deficiency anemia, unspecified: Secondary | ICD-10-CM | POA: Diagnosis not present

## 2019-10-20 DIAGNOSIS — D631 Anemia in chronic kidney disease: Secondary | ICD-10-CM | POA: Diagnosis not present

## 2019-10-20 DIAGNOSIS — N2581 Secondary hyperparathyroidism of renal origin: Secondary | ICD-10-CM | POA: Diagnosis not present

## 2019-10-22 DIAGNOSIS — N186 End stage renal disease: Secondary | ICD-10-CM | POA: Diagnosis not present

## 2019-10-22 DIAGNOSIS — D631 Anemia in chronic kidney disease: Secondary | ICD-10-CM | POA: Diagnosis not present

## 2019-10-22 DIAGNOSIS — N2581 Secondary hyperparathyroidism of renal origin: Secondary | ICD-10-CM | POA: Diagnosis not present

## 2019-10-22 DIAGNOSIS — Z992 Dependence on renal dialysis: Secondary | ICD-10-CM | POA: Diagnosis not present

## 2019-10-22 DIAGNOSIS — D509 Iron deficiency anemia, unspecified: Secondary | ICD-10-CM | POA: Diagnosis not present

## 2019-10-25 DIAGNOSIS — Z992 Dependence on renal dialysis: Secondary | ICD-10-CM | POA: Diagnosis not present

## 2019-10-25 DIAGNOSIS — N2581 Secondary hyperparathyroidism of renal origin: Secondary | ICD-10-CM | POA: Diagnosis not present

## 2019-10-25 DIAGNOSIS — D631 Anemia in chronic kidney disease: Secondary | ICD-10-CM | POA: Diagnosis not present

## 2019-10-25 DIAGNOSIS — D509 Iron deficiency anemia, unspecified: Secondary | ICD-10-CM | POA: Diagnosis not present

## 2019-10-25 DIAGNOSIS — N186 End stage renal disease: Secondary | ICD-10-CM | POA: Diagnosis not present

## 2019-10-27 DIAGNOSIS — D509 Iron deficiency anemia, unspecified: Secondary | ICD-10-CM | POA: Diagnosis not present

## 2019-10-27 DIAGNOSIS — N2581 Secondary hyperparathyroidism of renal origin: Secondary | ICD-10-CM | POA: Diagnosis not present

## 2019-10-27 DIAGNOSIS — N186 End stage renal disease: Secondary | ICD-10-CM | POA: Diagnosis not present

## 2019-10-27 DIAGNOSIS — Z992 Dependence on renal dialysis: Secondary | ICD-10-CM | POA: Diagnosis not present

## 2019-10-27 DIAGNOSIS — D631 Anemia in chronic kidney disease: Secondary | ICD-10-CM | POA: Diagnosis not present

## 2019-10-29 DIAGNOSIS — D509 Iron deficiency anemia, unspecified: Secondary | ICD-10-CM | POA: Diagnosis not present

## 2019-10-29 DIAGNOSIS — N2581 Secondary hyperparathyroidism of renal origin: Secondary | ICD-10-CM | POA: Diagnosis not present

## 2019-10-29 DIAGNOSIS — D631 Anemia in chronic kidney disease: Secondary | ICD-10-CM | POA: Diagnosis not present

## 2019-10-29 DIAGNOSIS — Z992 Dependence on renal dialysis: Secondary | ICD-10-CM | POA: Diagnosis not present

## 2019-10-29 DIAGNOSIS — N186 End stage renal disease: Secondary | ICD-10-CM | POA: Diagnosis not present

## 2019-10-31 DIAGNOSIS — D631 Anemia in chronic kidney disease: Secondary | ICD-10-CM | POA: Diagnosis not present

## 2019-10-31 DIAGNOSIS — N186 End stage renal disease: Secondary | ICD-10-CM | POA: Diagnosis not present

## 2019-10-31 DIAGNOSIS — N2581 Secondary hyperparathyroidism of renal origin: Secondary | ICD-10-CM | POA: Diagnosis not present

## 2019-10-31 DIAGNOSIS — D509 Iron deficiency anemia, unspecified: Secondary | ICD-10-CM | POA: Diagnosis not present

## 2019-10-31 DIAGNOSIS — Z992 Dependence on renal dialysis: Secondary | ICD-10-CM | POA: Diagnosis not present

## 2019-11-02 DIAGNOSIS — Z992 Dependence on renal dialysis: Secondary | ICD-10-CM | POA: Diagnosis not present

## 2019-11-02 DIAGNOSIS — D631 Anemia in chronic kidney disease: Secondary | ICD-10-CM | POA: Diagnosis not present

## 2019-11-02 DIAGNOSIS — N186 End stage renal disease: Secondary | ICD-10-CM | POA: Diagnosis not present

## 2019-11-02 DIAGNOSIS — N2581 Secondary hyperparathyroidism of renal origin: Secondary | ICD-10-CM | POA: Diagnosis not present

## 2019-11-02 DIAGNOSIS — D509 Iron deficiency anemia, unspecified: Secondary | ICD-10-CM | POA: Diagnosis not present

## 2019-11-05 DIAGNOSIS — N186 End stage renal disease: Secondary | ICD-10-CM | POA: Diagnosis not present

## 2019-11-05 DIAGNOSIS — N2581 Secondary hyperparathyroidism of renal origin: Secondary | ICD-10-CM | POA: Diagnosis not present

## 2019-11-05 DIAGNOSIS — D509 Iron deficiency anemia, unspecified: Secondary | ICD-10-CM | POA: Diagnosis not present

## 2019-11-05 DIAGNOSIS — D631 Anemia in chronic kidney disease: Secondary | ICD-10-CM | POA: Diagnosis not present

## 2019-11-05 DIAGNOSIS — Z992 Dependence on renal dialysis: Secondary | ICD-10-CM | POA: Diagnosis not present

## 2019-11-08 DIAGNOSIS — D509 Iron deficiency anemia, unspecified: Secondary | ICD-10-CM | POA: Diagnosis not present

## 2019-11-08 DIAGNOSIS — N186 End stage renal disease: Secondary | ICD-10-CM | POA: Diagnosis not present

## 2019-11-08 DIAGNOSIS — D631 Anemia in chronic kidney disease: Secondary | ICD-10-CM | POA: Diagnosis not present

## 2019-11-08 DIAGNOSIS — Z992 Dependence on renal dialysis: Secondary | ICD-10-CM | POA: Diagnosis not present

## 2019-11-08 DIAGNOSIS — N2581 Secondary hyperparathyroidism of renal origin: Secondary | ICD-10-CM | POA: Diagnosis not present

## 2020-01-10 ENCOUNTER — Other Ambulatory Visit: Payer: Self-pay

## 2020-01-10 ENCOUNTER — Ambulatory Visit: Payer: Medicare Other | Admitting: Nurse Practitioner

## 2020-10-28 ENCOUNTER — Other Ambulatory Visit: Payer: Self-pay

## 2020-10-28 ENCOUNTER — Inpatient Hospital Stay (HOSPITAL_COMMUNITY)
Admission: EM | Admit: 2020-10-28 | Discharge: 2020-12-14 | DRG: 246 | Disposition: A | Discharge status: Home Health | Payer: Medicare Other | Attending: Family Medicine | Admitting: Family Medicine

## 2020-10-28 ENCOUNTER — Encounter (HOSPITAL_COMMUNITY): Payer: Self-pay | Admitting: *Deleted

## 2020-10-28 ENCOUNTER — Inpatient Hospital Stay (HOSPITAL_COMMUNITY)
Admission: EM | Disposition: A | Discharge status: Home Health | Payer: Self-pay | Source: Home / Self Care | Attending: Cardiology

## 2020-10-28 ENCOUNTER — Inpatient Hospital Stay (HOSPITAL_COMMUNITY): Payer: Medicare Other

## 2020-10-28 DIAGNOSIS — I2690 Septic pulmonary embolism without acute cor pulmonale: Secondary | ICD-10-CM | POA: Diagnosis not present

## 2020-10-28 DIAGNOSIS — R042 Hemoptysis: Secondary | ICD-10-CM

## 2020-10-28 DIAGNOSIS — N2581 Secondary hyperparathyroidism of renal origin: Secondary | ICD-10-CM | POA: Diagnosis not present

## 2020-10-28 DIAGNOSIS — G928 Other toxic encephalopathy: Secondary | ICD-10-CM | POA: Diagnosis not present

## 2020-10-28 DIAGNOSIS — K2081 Other esophagitis with bleeding: Secondary | ICD-10-CM | POA: Diagnosis not present

## 2020-10-28 DIAGNOSIS — I10 Essential (primary) hypertension: Secondary | ICD-10-CM | POA: Diagnosis not present

## 2020-10-28 DIAGNOSIS — E871 Hypo-osmolality and hyponatremia: Secondary | ICD-10-CM | POA: Diagnosis not present

## 2020-10-28 DIAGNOSIS — I5022 Chronic systolic (congestive) heart failure: Secondary | ICD-10-CM | POA: Diagnosis present

## 2020-10-28 DIAGNOSIS — J9600 Acute respiratory failure, unspecified whether with hypoxia or hypercapnia: Secondary | ICD-10-CM

## 2020-10-28 DIAGNOSIS — Z1624 Resistance to multiple antibiotics: Secondary | ICD-10-CM | POA: Diagnosis present

## 2020-10-28 DIAGNOSIS — I493 Ventricular premature depolarization: Secondary | ICD-10-CM | POA: Diagnosis present

## 2020-10-28 DIAGNOSIS — T82510A Breakdown (mechanical) of surgically created arteriovenous fistula, initial encounter: Secondary | ICD-10-CM | POA: Diagnosis not present

## 2020-10-28 DIAGNOSIS — R159 Full incontinence of feces: Secondary | ICD-10-CM | POA: Diagnosis not present

## 2020-10-28 DIAGNOSIS — J15211 Pneumonia due to Methicillin susceptible Staphylococcus aureus: Secondary | ICD-10-CM | POA: Diagnosis not present

## 2020-10-28 DIAGNOSIS — I255 Ischemic cardiomyopathy: Secondary | ICD-10-CM | POA: Diagnosis present

## 2020-10-28 DIAGNOSIS — J810 Acute pulmonary edema: Secondary | ICD-10-CM | POA: Diagnosis not present

## 2020-10-28 DIAGNOSIS — Z9911 Dependence on respirator [ventilator] status: Secondary | ICD-10-CM | POA: Diagnosis not present

## 2020-10-28 DIAGNOSIS — Z720 Tobacco use: Secondary | ICD-10-CM

## 2020-10-28 DIAGNOSIS — R0603 Acute respiratory distress: Secondary | ICD-10-CM | POA: Diagnosis not present

## 2020-10-28 DIAGNOSIS — Z978 Presence of other specified devices: Secondary | ICD-10-CM

## 2020-10-28 DIAGNOSIS — I712 Thoracic aortic aneurysm, without rupture: Secondary | ICD-10-CM | POA: Diagnosis not present

## 2020-10-28 DIAGNOSIS — I469 Cardiac arrest, cause unspecified: Secondary | ICD-10-CM | POA: Diagnosis not present

## 2020-10-28 DIAGNOSIS — D500 Iron deficiency anemia secondary to blood loss (chronic): Secondary | ICD-10-CM | POA: Diagnosis not present

## 2020-10-28 DIAGNOSIS — I5031 Acute diastolic (congestive) heart failure: Secondary | ICD-10-CM | POA: Diagnosis not present

## 2020-10-28 DIAGNOSIS — I219 Acute myocardial infarction, unspecified: Secondary | ICD-10-CM

## 2020-10-28 DIAGNOSIS — I2109 ST elevation (STEMI) myocardial infarction involving other coronary artery of anterior wall: Secondary | ICD-10-CM

## 2020-10-28 DIAGNOSIS — Z4682 Encounter for fitting and adjustment of non-vascular catheter: Secondary | ICD-10-CM | POA: Diagnosis not present

## 2020-10-28 DIAGNOSIS — I132 Hypertensive heart and chronic kidney disease with heart failure and with stage 5 chronic kidney disease, or end stage renal disease: Secondary | ICD-10-CM | POA: Diagnosis present

## 2020-10-28 DIAGNOSIS — Z992 Dependence on renal dialysis: Secondary | ICD-10-CM | POA: Diagnosis not present

## 2020-10-28 DIAGNOSIS — K219 Gastro-esophageal reflux disease without esophagitis: Secondary | ICD-10-CM | POA: Diagnosis present

## 2020-10-28 DIAGNOSIS — Z955 Presence of coronary angioplasty implant and graft: Secondary | ICD-10-CM | POA: Diagnosis not present

## 2020-10-28 DIAGNOSIS — I340 Nonrheumatic mitral (valve) insufficiency: Secondary | ICD-10-CM | POA: Diagnosis not present

## 2020-10-28 DIAGNOSIS — R6521 Severe sepsis with septic shock: Secondary | ICD-10-CM | POA: Diagnosis not present

## 2020-10-28 DIAGNOSIS — Z95810 Presence of automatic (implantable) cardiac defibrillator: Secondary | ICD-10-CM | POA: Diagnosis not present

## 2020-10-28 DIAGNOSIS — I953 Hypotension of hemodialysis: Secondary | ICD-10-CM | POA: Diagnosis not present

## 2020-10-28 DIAGNOSIS — R570 Cardiogenic shock: Secondary | ICD-10-CM | POA: Diagnosis not present

## 2020-10-28 DIAGNOSIS — Z20822 Contact with and (suspected) exposure to covid-19: Secondary | ICD-10-CM | POA: Diagnosis present

## 2020-10-28 DIAGNOSIS — I25110 Atherosclerotic heart disease of native coronary artery with unstable angina pectoris: Secondary | ICD-10-CM | POA: Diagnosis not present

## 2020-10-28 DIAGNOSIS — L8990 Pressure ulcer of unspecified site, unspecified stage: Secondary | ICD-10-CM | POA: Insufficient documentation

## 2020-10-28 DIAGNOSIS — Z6822 Body mass index (BMI) 22.0-22.9, adult: Secondary | ICD-10-CM

## 2020-10-28 DIAGNOSIS — Z888 Allergy status to other drugs, medicaments and biological substances status: Secondary | ICD-10-CM

## 2020-10-28 DIAGNOSIS — L89310 Pressure ulcer of right buttock, unstageable: Secondary | ICD-10-CM | POA: Diagnosis not present

## 2020-10-28 DIAGNOSIS — J9601 Acute respiratory failure with hypoxia: Secondary | ICD-10-CM | POA: Diagnosis not present

## 2020-10-28 DIAGNOSIS — R0902 Hypoxemia: Secondary | ICD-10-CM

## 2020-10-28 DIAGNOSIS — Y712 Prosthetic and other implants, materials and accessory cardiovascular devices associated with adverse incidents: Secondary | ICD-10-CM | POA: Diagnosis not present

## 2020-10-28 DIAGNOSIS — T884XXA Failed or difficult intubation, initial encounter: Secondary | ICD-10-CM

## 2020-10-28 DIAGNOSIS — K8689 Other specified diseases of pancreas: Secondary | ICD-10-CM | POA: Diagnosis not present

## 2020-10-28 DIAGNOSIS — Z7682 Awaiting organ transplant status: Secondary | ICD-10-CM

## 2020-10-28 DIAGNOSIS — E785 Hyperlipidemia, unspecified: Secondary | ICD-10-CM | POA: Diagnosis not present

## 2020-10-28 DIAGNOSIS — A419 Sepsis, unspecified organism: Secondary | ICD-10-CM | POA: Diagnosis not present

## 2020-10-28 DIAGNOSIS — T82590A Other mechanical complication of surgically created arteriovenous fistula, initial encounter: Secondary | ICD-10-CM | POA: Diagnosis not present

## 2020-10-28 DIAGNOSIS — J851 Abscess of lung with pneumonia: Secondary | ICD-10-CM | POA: Diagnosis present

## 2020-10-28 DIAGNOSIS — R32 Unspecified urinary incontinence: Secondary | ICD-10-CM | POA: Diagnosis not present

## 2020-10-28 DIAGNOSIS — I5042 Chronic combined systolic (congestive) and diastolic (congestive) heart failure: Secondary | ICD-10-CM | POA: Diagnosis present

## 2020-10-28 DIAGNOSIS — I480 Paroxysmal atrial fibrillation: Secondary | ICD-10-CM | POA: Diagnosis not present

## 2020-10-28 DIAGNOSIS — I330 Acute and subacute infective endocarditis: Secondary | ICD-10-CM | POA: Diagnosis present

## 2020-10-28 DIAGNOSIS — B9561 Methicillin susceptible Staphylococcus aureus infection as the cause of diseases classified elsewhere: Secondary | ICD-10-CM

## 2020-10-28 DIAGNOSIS — Z8249 Family history of ischemic heart disease and other diseases of the circulatory system: Secondary | ICD-10-CM

## 2020-10-28 DIAGNOSIS — I4891 Unspecified atrial fibrillation: Secondary | ICD-10-CM | POA: Diagnosis not present

## 2020-10-28 DIAGNOSIS — M898X9 Other specified disorders of bone, unspecified site: Secondary | ICD-10-CM | POA: Diagnosis not present

## 2020-10-28 DIAGNOSIS — J9690 Respiratory failure, unspecified, unspecified whether with hypoxia or hypercapnia: Secondary | ICD-10-CM

## 2020-10-28 DIAGNOSIS — D631 Anemia in chronic kidney disease: Secondary | ICD-10-CM | POA: Diagnosis present

## 2020-10-28 DIAGNOSIS — T8144XA Sepsis following a procedure, initial encounter: Secondary | ICD-10-CM | POA: Diagnosis not present

## 2020-10-28 DIAGNOSIS — A4101 Sepsis due to Methicillin susceptible Staphylococcus aureus: Secondary | ICD-10-CM | POA: Diagnosis not present

## 2020-10-28 DIAGNOSIS — D1803 Hemangioma of intra-abdominal structures: Secondary | ICD-10-CM | POA: Diagnosis not present

## 2020-10-28 DIAGNOSIS — J869 Pyothorax without fistula: Secondary | ICD-10-CM

## 2020-10-28 DIAGNOSIS — F410 Panic disorder [episodic paroxysmal anxiety] without agoraphobia: Secondary | ICD-10-CM | POA: Diagnosis present

## 2020-10-28 DIAGNOSIS — E162 Hypoglycemia, unspecified: Secondary | ICD-10-CM | POA: Diagnosis not present

## 2020-10-28 DIAGNOSIS — I213 ST elevation (STEMI) myocardial infarction of unspecified site: Secondary | ICD-10-CM | POA: Diagnosis not present

## 2020-10-28 DIAGNOSIS — E43 Unspecified severe protein-calorie malnutrition: Secondary | ICD-10-CM | POA: Insufficient documentation

## 2020-10-28 DIAGNOSIS — K922 Gastrointestinal hemorrhage, unspecified: Secondary | ICD-10-CM

## 2020-10-28 DIAGNOSIS — T8112XA Postprocedural septic shock, initial encounter: Secondary | ICD-10-CM | POA: Diagnosis not present

## 2020-10-28 DIAGNOSIS — Z938 Other artificial opening status: Secondary | ICD-10-CM

## 2020-10-28 DIAGNOSIS — I361 Nonrheumatic tricuspid (valve) insufficiency: Secondary | ICD-10-CM | POA: Diagnosis not present

## 2020-10-28 DIAGNOSIS — I351 Nonrheumatic aortic (valve) insufficiency: Secondary | ICD-10-CM | POA: Diagnosis not present

## 2020-10-28 DIAGNOSIS — K264 Chronic or unspecified duodenal ulcer with hemorrhage: Secondary | ICD-10-CM | POA: Diagnosis present

## 2020-10-28 DIAGNOSIS — R069 Unspecified abnormalities of breathing: Secondary | ICD-10-CM

## 2020-10-28 DIAGNOSIS — T85598S Other mechanical complication of other gastrointestinal prosthetic devices, implants and grafts, sequela: Secondary | ICD-10-CM | POA: Diagnosis not present

## 2020-10-28 DIAGNOSIS — I5023 Acute on chronic systolic (congestive) heart failure: Secondary | ICD-10-CM | POA: Diagnosis not present

## 2020-10-28 DIAGNOSIS — R34 Anuria and oliguria: Secondary | ICD-10-CM | POA: Diagnosis not present

## 2020-10-28 DIAGNOSIS — D62 Acute posthemorrhagic anemia: Secondary | ICD-10-CM | POA: Diagnosis not present

## 2020-10-28 DIAGNOSIS — Z8719 Personal history of other diseases of the digestive system: Secondary | ICD-10-CM

## 2020-10-28 DIAGNOSIS — I252 Old myocardial infarction: Secondary | ICD-10-CM

## 2020-10-28 DIAGNOSIS — G4733 Obstructive sleep apnea (adult) (pediatric): Secondary | ICD-10-CM | POA: Diagnosis present

## 2020-10-28 DIAGNOSIS — J90 Pleural effusion, not elsewhere classified: Secondary | ICD-10-CM

## 2020-10-28 DIAGNOSIS — R0602 Shortness of breath: Secondary | ICD-10-CM

## 2020-10-28 DIAGNOSIS — I5043 Acute on chronic combined systolic (congestive) and diastolic (congestive) heart failure: Secondary | ICD-10-CM | POA: Diagnosis not present

## 2020-10-28 DIAGNOSIS — R54 Age-related physical debility: Secondary | ICD-10-CM | POA: Diagnosis present

## 2020-10-28 DIAGNOSIS — K2980 Duodenitis without bleeding: Secondary | ICD-10-CM | POA: Diagnosis present

## 2020-10-28 DIAGNOSIS — D6489 Other specified anemias: Secondary | ICD-10-CM | POA: Diagnosis not present

## 2020-10-28 DIAGNOSIS — R140 Abdominal distension (gaseous): Secondary | ICD-10-CM

## 2020-10-28 DIAGNOSIS — N186 End stage renal disease: Secondary | ICD-10-CM | POA: Diagnosis not present

## 2020-10-28 DIAGNOSIS — Z79899 Other long term (current) drug therapy: Secondary | ICD-10-CM

## 2020-10-28 DIAGNOSIS — L89154 Pressure ulcer of sacral region, stage 4: Secondary | ICD-10-CM | POA: Diagnosis not present

## 2020-10-28 DIAGNOSIS — I25118 Atherosclerotic heart disease of native coronary artery with other forms of angina pectoris: Secondary | ICD-10-CM | POA: Diagnosis not present

## 2020-10-28 DIAGNOSIS — I482 Chronic atrial fibrillation, unspecified: Secondary | ICD-10-CM | POA: Diagnosis not present

## 2020-10-28 DIAGNOSIS — R579 Shock, unspecified: Secondary | ICD-10-CM | POA: Diagnosis not present

## 2020-10-28 DIAGNOSIS — T85598A Other mechanical complication of other gastrointestinal prosthetic devices, implants and grafts, initial encounter: Secondary | ICD-10-CM

## 2020-10-28 DIAGNOSIS — R531 Weakness: Secondary | ICD-10-CM

## 2020-10-28 DIAGNOSIS — E875 Hyperkalemia: Secondary | ICD-10-CM | POA: Diagnosis not present

## 2020-10-28 DIAGNOSIS — K567 Ileus, unspecified: Secondary | ICD-10-CM | POA: Diagnosis not present

## 2020-10-28 DIAGNOSIS — E278 Other specified disorders of adrenal gland: Secondary | ICD-10-CM | POA: Diagnosis present

## 2020-10-28 DIAGNOSIS — Z9689 Presence of other specified functional implants: Secondary | ICD-10-CM

## 2020-10-28 DIAGNOSIS — R0600 Dyspnea, unspecified: Secondary | ICD-10-CM

## 2020-10-28 DIAGNOSIS — J189 Pneumonia, unspecified organism: Secondary | ICD-10-CM

## 2020-10-28 DIAGNOSIS — Z4659 Encounter for fitting and adjustment of other gastrointestinal appliance and device: Secondary | ICD-10-CM

## 2020-10-28 DIAGNOSIS — Z452 Encounter for adjustment and management of vascular access device: Secondary | ICD-10-CM

## 2020-10-28 DIAGNOSIS — R578 Other shock: Secondary | ICD-10-CM | POA: Diagnosis not present

## 2020-10-28 DIAGNOSIS — R1110 Vomiting, unspecified: Secondary | ICD-10-CM

## 2020-10-28 DIAGNOSIS — I2102 ST elevation (STEMI) myocardial infarction involving left anterior descending coronary artery: Secondary | ICD-10-CM | POA: Diagnosis not present

## 2020-10-28 DIAGNOSIS — R012 Other cardiac sounds: Secondary | ICD-10-CM | POA: Diagnosis not present

## 2020-10-28 DIAGNOSIS — I2510 Atherosclerotic heart disease of native coronary artery without angina pectoris: Secondary | ICD-10-CM | POA: Diagnosis present

## 2020-10-28 DIAGNOSIS — Z0189 Encounter for other specified special examinations: Secondary | ICD-10-CM

## 2020-10-28 DIAGNOSIS — R7881 Bacteremia: Secondary | ICD-10-CM

## 2020-10-28 DIAGNOSIS — I5041 Acute combined systolic (congestive) and diastolic (congestive) heart failure: Secondary | ICD-10-CM | POA: Diagnosis present

## 2020-10-28 DIAGNOSIS — L89320 Pressure ulcer of left buttock, unstageable: Secondary | ICD-10-CM | POA: Diagnosis not present

## 2020-10-28 HISTORY — DX: Presence of coronary angioplasty implant and graft: Z95.5

## 2020-10-28 HISTORY — DX: Hyperlipidemia, unspecified: E78.5

## 2020-10-28 HISTORY — DX: Chronic systolic (congestive) heart failure: I50.22

## 2020-10-28 HISTORY — PX: CORONARY/GRAFT ACUTE MI REVASCULARIZATION: CATH118305

## 2020-10-28 HISTORY — DX: Atherosclerotic heart disease of native coronary artery with unstable angina pectoris: I25.110

## 2020-10-28 HISTORY — PX: LEFT HEART CATH AND CORONARY ANGIOGRAPHY: CATH118249

## 2020-10-28 HISTORY — PX: CORONARY STENT INTERVENTION: CATH118234

## 2020-10-28 HISTORY — DX: ST elevation (STEMI) myocardial infarction involving other coronary artery of anterior wall: I21.09

## 2020-10-28 HISTORY — DX: End stage renal disease: Z99.2

## 2020-10-28 HISTORY — DX: End stage renal disease: N18.6

## 2020-10-28 LAB — TROPONIN I (HIGH SENSITIVITY)
Troponin I (High Sensitivity): 3155 ng/L (ref <18)
Troponin I (High Sensitivity): >27000 ng/L (ref <18)
Troponin I (High Sensitivity): >27000 ng/L (ref <18)

## 2020-10-28 LAB — CBC WITH DIFFERENTIAL/PLATELET
Abs Immature Granulocytes: 0.08 10*3/uL — ABNORMAL HIGH (ref 0.00–0.07)
Basophils Absolute: 0 10*3/uL (ref 0.0–0.1)
Basophils Relative: 0 %
Eosinophils Absolute: 0 10*3/uL (ref 0.0–0.5)
Eosinophils Relative: 0 %
HCT: 29.5 % — ABNORMAL LOW (ref 39.0–52.0)
Hemoglobin: 9.5 g/dL — ABNORMAL LOW (ref 13.0–17.0)
Immature Granulocytes: 1 %
Lymphocytes Relative: 3 %
Lymphs Abs: 0.4 10*3/uL — ABNORMAL LOW (ref 0.7–4.0)
MCH: 30.7 pg (ref 26.0–34.0)
MCHC: 32.2 g/dL (ref 30.0–36.0)
MCV: 95.5 fL (ref 80.0–100.0)
Monocytes Absolute: 0.8 10*3/uL (ref 0.1–1.0)
Monocytes Relative: 7 %
Neutro Abs: 11.4 10*3/uL — ABNORMAL HIGH (ref 1.7–7.7)
Neutrophils Relative %: 89 %
Platelets: 143 10*3/uL — ABNORMAL LOW (ref 150–400)
RBC: 3.09 MIL/uL — ABNORMAL LOW (ref 4.22–5.81)
RDW: 13.9 % (ref 11.5–15.5)
WBC: 12.7 10*3/uL — ABNORMAL HIGH (ref 4.0–10.5)
nRBC: 0 % (ref 0.0–0.2)

## 2020-10-28 LAB — RESPIRATORY PANEL BY RT PCR (FLU A&B, COVID)
Influenza A by PCR: NEGATIVE
Influenza B by PCR: NEGATIVE
SARS Coronavirus 2 by RT PCR: NEGATIVE

## 2020-10-28 LAB — LIPID PANEL
Cholesterol: 145 mg/dL (ref 0–200)
HDL: 23 mg/dL — ABNORMAL LOW (ref >40)
LDL Cholesterol: 80 mg/dL (ref 0–99)
Total CHOL/HDL Ratio: 6.3 RATIO
Triglycerides: 211 mg/dL — ABNORMAL HIGH (ref <150)
VLDL: 42 mg/dL — ABNORMAL HIGH (ref 0–40)

## 2020-10-28 LAB — SURGICAL PCR SCREEN
MRSA, PCR: NEGATIVE
Staphylococcus aureus: POSITIVE — AB

## 2020-10-28 LAB — COMPREHENSIVE METABOLIC PANEL
ALT: 87 U/L — ABNORMAL HIGH (ref 0–44)
AST: 84 U/L — ABNORMAL HIGH (ref 15–41)
Albumin: 3.1 g/dL — ABNORMAL LOW (ref 3.5–5.0)
Alkaline Phosphatase: 146 U/L — ABNORMAL HIGH (ref 38–126)
Anion gap: 16 — ABNORMAL HIGH (ref 5–15)
BUN: 30 mg/dL — ABNORMAL HIGH (ref 6–20)
CO2: 31 mmol/L (ref 22–32)
Calcium: 9.8 mg/dL (ref 8.9–10.3)
Chloride: 86 mmol/L — ABNORMAL LOW (ref 98–111)
Creatinine, Ser: 9.73 mg/dL — ABNORMAL HIGH (ref 0.61–1.24)
GFR, Estimated: 6 mL/min — ABNORMAL LOW (ref >60)
Glucose, Bld: 140 mg/dL — ABNORMAL HIGH (ref 70–99)
Potassium: 3.7 mmol/L (ref 3.5–5.1)
Sodium: 133 mmol/L — ABNORMAL LOW (ref 135–145)
Total Bilirubin: 1.2 mg/dL (ref 0.3–1.2)
Total Protein: 6.6 g/dL (ref 6.5–8.1)

## 2020-10-28 LAB — HEMOGLOBIN A1C
Hgb A1c MFr Bld: 4.3 % — ABNORMAL LOW (ref 4.8–5.6)
Mean Plasma Glucose: 76.71 mg/dL

## 2020-10-28 LAB — POCT ACTIVATED CLOTTING TIME
Activated Clotting Time: 153 seconds
Activated Clotting Time: 142 seconds

## 2020-10-28 LAB — PROTIME-INR
INR: 1.3 — ABNORMAL HIGH (ref 0.8–1.2)
Prothrombin Time: 15.3 seconds — ABNORMAL HIGH (ref 11.4–15.2)

## 2020-10-28 LAB — MRSA PCR SCREENING: MRSA by PCR: NEGATIVE

## 2020-10-28 LAB — HEPATITIS B CORE ANTIBODY, TOTAL: Hep B Core Total Ab: NONREACTIVE

## 2020-10-28 LAB — HEPATITIS B SURFACE ANTIGEN: Hepatitis B Surface Ag: NONREACTIVE

## 2020-10-28 LAB — APTT: aPTT: 36 seconds (ref 24–36)

## 2020-10-28 LAB — GLUCOSE, CAPILLARY: Glucose-Capillary: 111 mg/dL — ABNORMAL HIGH (ref 70–99)

## 2020-10-28 SURGERY — CORONARY/GRAFT ACUTE MI REVASCULARIZATION
Anesthesia: LOCAL

## 2020-10-28 MED ORDER — METOPROLOL TARTRATE 5 MG/5ML IV SOLN
INTRAVENOUS | Status: AC
  Filled 2020-10-28: qty 5

## 2020-10-28 MED ORDER — CHLORHEXIDINE GLUCONATE CLOTH 2 % EX PADS
6.0000 | MEDICATED_PAD | Freq: Every day | CUTANEOUS | Status: DC
  Administered 2020-10-29 – 2020-11-14 (×16): 6 via TOPICAL

## 2020-10-28 MED ORDER — HEPARIN (PORCINE) IN NACL 1000-0.9 UT/500ML-% IV SOLN
INTRAVENOUS | Status: AC
  Filled 2020-10-28: qty 1000

## 2020-10-28 MED ORDER — SODIUM CHLORIDE 0.9 % IV SOLN
INTRAVENOUS | Status: DC
  Administered 2020-11-09: 20:00:00 10 mL via INTRAVENOUS
  Administered 2020-11-10: 10 mL/h via INTRAVENOUS

## 2020-10-28 MED ORDER — NITROGLYCERIN 1 MG/10 ML FOR IR/CATH LAB
INTRA_ARTERIAL | Status: DC | PRN
  Administered 2020-10-28 (×2): 200 ug via INTRACORONARY

## 2020-10-28 MED ORDER — NITROGLYCERIN IN D5W 200-5 MCG/ML-% IV SOLN
0.0000 ug/min | INTRAVENOUS | Status: DC
  Administered 2020-10-28: 5 ug/min via INTRAVENOUS
  Filled 2020-10-28: qty 250

## 2020-10-28 MED ORDER — TICAGRELOR 90 MG PO TABS
ORAL_TABLET | ORAL | Status: DC | PRN
  Administered 2020-10-28: 180 mg via ORAL

## 2020-10-28 MED ORDER — FERRIC CITRATE 1 GM 210 MG(FE) PO TABS
630.0000 mg | ORAL_TABLET | Freq: Three times a day (TID) | ORAL | Status: DC
  Filled 2020-10-28 (×2): qty 3

## 2020-10-28 MED ORDER — ORAL CARE MOUTH RINSE
15.0000 mL | Freq: Two times a day (BID) | OROMUCOSAL | Status: DC
  Administered 2020-10-28 – 2020-10-31 (×5): 15 mL via OROMUCOSAL

## 2020-10-28 MED ORDER — ONDANSETRON HCL 4 MG/2ML IJ SOLN
4.0000 mg | Freq: Four times a day (QID) | INTRAMUSCULAR | Status: DC | PRN
  Administered 2020-10-28 – 2020-11-23 (×6): 4 mg via INTRAVENOUS
  Filled 2020-10-28 (×6): qty 2

## 2020-10-28 MED ORDER — NITROGLYCERIN 0.4 MG SL SUBL
0.4000 mg | SUBLINGUAL_TABLET | SUBLINGUAL | Status: AC | PRN
  Administered 2020-10-28 – 2020-11-12 (×3): 0.4 mg via SUBLINGUAL
  Filled 2020-10-28 (×2): qty 1

## 2020-10-28 MED ORDER — MUPIROCIN 2 % EX OINT
1.0000 "application " | TOPICAL_OINTMENT | Freq: Two times a day (BID) | CUTANEOUS | Status: AC
  Administered 2020-10-28 – 2020-11-01 (×7): 1 via NASAL
  Filled 2020-10-28 (×3): qty 22

## 2020-10-28 MED ORDER — SODIUM CHLORIDE 0.9% FLUSH
3.0000 mL | Freq: Two times a day (BID) | INTRAVENOUS | Status: DC
  Administered 2020-10-28 – 2020-12-14 (×70): 3 mL via INTRAVENOUS

## 2020-10-28 MED ORDER — AMLODIPINE BESYLATE 10 MG PO TABS
10.0000 mg | ORAL_TABLET | Freq: Every day | ORAL | Status: DC
  Administered 2020-10-29: 10 mg via ORAL
  Filled 2020-10-28 (×3): qty 1

## 2020-10-28 MED ORDER — LIDOCAINE HCL (PF) 1 % IJ SOLN
INTRAMUSCULAR | Status: AC
  Filled 2020-10-28: qty 30

## 2020-10-28 MED ORDER — ATORVASTATIN CALCIUM 80 MG PO TABS
80.0000 mg | ORAL_TABLET | Freq: Every day | ORAL | Status: DC
  Administered 2020-10-29 – 2020-10-30 (×2): 80 mg via ORAL
  Filled 2020-10-28 (×2): qty 1

## 2020-10-28 MED ORDER — MIDAZOLAM HCL 2 MG/2ML IJ SOLN
INTRAMUSCULAR | Status: AC
  Filled 2020-10-28: qty 2

## 2020-10-28 MED ORDER — PROMETHAZINE HCL 25 MG/ML IJ SOLN
12.5000 mg | Freq: Once | INTRAMUSCULAR | Status: AC
  Administered 2020-10-28: 12.5 mg via INTRAVENOUS
  Filled 2020-10-28: qty 1

## 2020-10-28 MED ORDER — TICAGRELOR 90 MG PO TABS
90.0000 mg | ORAL_TABLET | Freq: Two times a day (BID) | ORAL | Status: DC
  Administered 2020-10-28 – 2020-11-01 (×7): 90 mg via ORAL
  Filled 2020-10-28 (×7): qty 1

## 2020-10-28 MED ORDER — IOHEXOL 350 MG/ML SOLN
INTRAVENOUS | Status: DC | PRN
  Administered 2020-10-28: 275 mL via INTRA_ARTERIAL

## 2020-10-28 MED ORDER — HEPARIN (PORCINE) IN NACL 1000-0.9 UT/500ML-% IV SOLN
INTRAVENOUS | Status: DC | PRN
  Administered 2020-10-28 (×2): 500 mL

## 2020-10-28 MED ORDER — OXYCODONE HCL 5 MG PO TABS
5.0000 mg | ORAL_TABLET | Freq: Four times a day (QID) | ORAL | Status: DC | PRN
  Administered 2020-10-29 – 2020-10-31 (×3): 10 mg via ORAL
  Filled 2020-10-28 (×3): qty 2

## 2020-10-28 MED ORDER — TAMSULOSIN HCL 0.4 MG PO CAPS: 0.4000 mg | ORAL_CAPSULE | Freq: Every day | ORAL | Status: DC

## 2020-10-28 MED ORDER — MORPHINE SULFATE (PF) 2 MG/ML IV SOLN
2.0000 mg | INTRAVENOUS | Status: DC | PRN
  Administered 2020-10-28 – 2020-10-30 (×5): 2 mg via INTRAVENOUS
  Filled 2020-10-28 (×5): qty 1

## 2020-10-28 MED ORDER — FENTANYL CITRATE (PF) 100 MCG/2ML IJ SOLN
INTRAMUSCULAR | Status: DC | PRN
  Administered 2020-10-28: 25 ug via INTRAVENOUS

## 2020-10-28 MED ORDER — CINACALCET HCL 30 MG PO TABS: 120.0000 mg | ORAL_TABLET | Freq: Every day | ORAL | Status: DC

## 2020-10-28 MED ORDER — ASPIRIN 81 MG PO CHEW
324.0000 mg | CHEWABLE_TABLET | Freq: Once | ORAL | Status: AC
  Administered 2020-10-28: 324 mg via ORAL
  Filled 2020-10-28: qty 4

## 2020-10-28 MED ORDER — SILDENAFIL CITRATE 50 MG PO TABS: 25.0000 mg | ORAL_TABLET | ORAL | Status: DC | PRN

## 2020-10-28 MED ORDER — HEPARIN SODIUM (PORCINE) 1000 UNIT/ML IJ SOLN
INTRAMUSCULAR | Status: AC
  Filled 2020-10-28: qty 1

## 2020-10-28 MED ORDER — MIDAZOLAM HCL 2 MG/2ML IJ SOLN
INTRAMUSCULAR | Status: DC | PRN
  Administered 2020-10-28: 2 mg via INTRAVENOUS

## 2020-10-28 MED ORDER — HEPARIN SODIUM (PORCINE) 5000 UNIT/ML IJ SOLN
4000.0000 [IU] | Freq: Once | INTRAMUSCULAR | Status: AC
  Administered 2020-10-28: 4000 [IU] via INTRAVENOUS
  Filled 2020-10-28: qty 1

## 2020-10-28 MED ORDER — FENTANYL CITRATE (PF) 100 MCG/2ML IJ SOLN
INTRAMUSCULAR | Status: AC
  Filled 2020-10-28: qty 2

## 2020-10-28 MED ORDER — NITROGLYCERIN 1 MG/10 ML FOR IR/CATH LAB
INTRA_ARTERIAL | Status: AC
  Filled 2020-10-28: qty 10

## 2020-10-28 MED ORDER — METOPROLOL TARTRATE 5 MG/5ML IV SOLN
INTRAVENOUS | Status: DC | PRN
  Administered 2020-10-28 (×2): 5 mg via INTRAVENOUS

## 2020-10-28 MED ORDER — CHLORPROMAZINE HCL 25 MG PO TABS
25.0000 mg | ORAL_TABLET | Freq: Once | ORAL | Status: AC
  Administered 2020-10-28: 25 mg via ORAL
  Filled 2020-10-28: qty 1

## 2020-10-28 MED ORDER — DOXERCALCIFEROL 4 MCG/2ML IV SOLN
5.0000 ug | INTRAVENOUS | Status: DC
  Filled 2020-10-28 (×3): qty 4

## 2020-10-28 MED ORDER — IOHEXOL 350 MG/ML SOLN
INTRAVENOUS | Status: AC
  Filled 2020-10-28: qty 1

## 2020-10-28 MED ORDER — SODIUM CHLORIDE 0.9% FLUSH
3.0000 mL | INTRAVENOUS | Status: DC | PRN
  Administered 2020-11-17 – 2020-11-30 (×3): 3 mL via INTRAVENOUS

## 2020-10-28 MED ORDER — HYDRALAZINE HCL 20 MG/ML IJ SOLN
10.0000 mg | INTRAMUSCULAR | Status: AC | PRN
  Administered 2020-10-28 (×2): 10 mg via INTRAVENOUS
  Filled 2020-10-28 (×2): qty 1

## 2020-10-28 MED ORDER — FERRIC CITRATE 1 GM 210 MG(FE) PO TABS
210.0000 mg | ORAL_TABLET | ORAL | Status: DC
  Filled 2020-10-28 (×2): qty 1

## 2020-10-28 MED ORDER — LIDOCAINE HCL (PF) 1 % IJ SOLN
INTRAMUSCULAR | Status: DC | PRN
  Administered 2020-10-28: 15 mL via SUBCUTANEOUS

## 2020-10-28 MED ORDER — HEPARIN (PORCINE) 25000 UT/250ML-% IV SOLN
1600.0000 [IU]/h | INTRAVENOUS | Status: DC
  Administered 2020-10-29: 1000 [IU]/h via INTRAVENOUS
  Administered 2020-10-30: 1600 [IU]/h via INTRAVENOUS
  Administered 2020-10-30: 1250 [IU]/h via INTRAVENOUS
  Administered 2020-10-31: 1600 [IU]/h via INTRAVENOUS
  Filled 2020-10-28 (×6): qty 250

## 2020-10-28 MED ORDER — LABETALOL HCL 300 MG PO TABS
300.0000 mg | ORAL_TABLET | Freq: Two times a day (BID) | ORAL | Status: DC
  Administered 2020-10-28 – 2020-10-29 (×2): 300 mg via ORAL
  Filled 2020-10-28 (×3): qty 1

## 2020-10-28 MED ORDER — SODIUM CHLORIDE 0.9 % IV SOLN
250.0000 mL | INTRAVENOUS | Status: DC | PRN
  Administered 2020-11-27: 09:00:00 250 mL via INTRAVENOUS

## 2020-10-28 MED ORDER — ACETAMINOPHEN 325 MG PO TABS
650.0000 mg | ORAL_TABLET | ORAL | Status: DC | PRN
  Administered 2020-10-28 – 2020-10-30 (×4): 650 mg via ORAL
  Filled 2020-10-28 (×4): qty 2

## 2020-10-28 MED ORDER — TICAGRELOR 90 MG PO TABS
ORAL_TABLET | ORAL | Status: AC
  Filled 2020-10-28: qty 2

## 2020-10-28 MED ORDER — ASPIRIN 81 MG PO CHEW
81.0000 mg | CHEWABLE_TABLET | Freq: Every day | ORAL | Status: DC
  Administered 2020-10-29 – 2020-11-01 (×4): 81 mg via ORAL
  Filled 2020-10-28 (×5): qty 1

## 2020-10-28 MED ORDER — POLYSACCHARIDE IRON COMPLEX 150 MG PO CAPS: 150.0000 mg | ORAL_CAPSULE | Freq: Two times a day (BID) | ORAL | Status: DC

## 2020-10-28 MED ORDER — ATROPINE SULFATE 1 MG/10ML IJ SOSY
PREFILLED_SYRINGE | INTRAMUSCULAR | Status: AC
  Filled 2020-10-28: qty 10

## 2020-10-28 MED ORDER — HEPARIN SODIUM (PORCINE) 1000 UNIT/ML IJ SOLN
INTRAMUSCULAR | Status: DC | PRN
  Administered 2020-10-28: 2000 [IU] via INTRAVENOUS
  Administered 2020-10-28: 6000 [IU] via INTRAVENOUS

## 2020-10-28 MED ORDER — LABETALOL HCL 5 MG/ML IV SOLN
10.0000 mg | INTRAVENOUS | Status: AC | PRN
  Administered 2020-10-28 (×3): 10 mg via INTRAVENOUS
  Filled 2020-10-28 (×3): qty 4

## 2020-10-28 SURGICAL SUPPLY — 21 items
BALLN SAPPHIRE 2.5X12 (BALLOONS) ×2
BALLN SAPPHIRE ~~LOC~~ 3.0X15 (BALLOONS) ×1 IMPLANT
BALLOON SAPPHIRE 2.5X12 (BALLOONS) IMPLANT
CATH INFINITI 5FR MULTPACK ANG (CATHETERS) ×1 IMPLANT
CATH LAUNCHER 6FR AL1 (CATHETERS) IMPLANT
CATH LAUNCHER 6FR AL2 (CATHETERS) IMPLANT
CATH VISTA GUIDE 6FR XBLAD3.5 (CATHETERS) ×1 IMPLANT
CATH VISTA GUIDE 6FR XBLAD4 (CATHETERS) ×1 IMPLANT
CATHETER LAUNCHER 6FR AL1 (CATHETERS) ×2
CATHETER LAUNCHER 6FR AL2 (CATHETERS) ×2
KIT ENCORE 26 ADVANTAGE (KITS) ×1 IMPLANT
KIT HEART LEFT (KITS) ×2 IMPLANT
PACK CARDIAC CATHETERIZATION (CUSTOM PROCEDURE TRAY) ×2 IMPLANT
SHEATH PINNACLE 6F 10CM (SHEATH) ×1 IMPLANT
SHEATH PINNACLE 7F 10CM (SHEATH) ×1 IMPLANT
STENT RESOLUTE ONYX 2.75X18 (Permanent Stent) ×1 IMPLANT
TRANSDUCER W/STOPCOCK (MISCELLANEOUS) ×2 IMPLANT
TUBING CIL FLEX 10 FLL-RA (TUBING) ×2 IMPLANT
WIRE ASAHI PROWATER 180CM (WIRE) ×1 IMPLANT
WIRE EMERALD 3MM-J .035X150CM (WIRE) ×1 IMPLANT
WIRE HI TORQ BMW 190CM (WIRE) ×1 IMPLANT

## 2020-10-28 NOTE — Discharge Instructions (Signed)

## 2020-10-28 NOTE — ED Notes (Signed)
Repeat EKG done and seen by Dr. Leonides Schanz.

## 2020-10-28 NOTE — ED Notes (Signed)
Arrived to room at this time per stretcher. [id number]. Pt conversant. Dr. Leonides Schanz at bedside.

## 2020-10-28 NOTE — ED Notes (Signed)
Megan RN called Lab and alerted to pt's CODE STEMI status and to facilitate test result asap.

## 2020-10-28 NOTE — Consult Note (Addendum)
Renal Service Consult Note [facility]  Travis [name] 10/28/2020 Travis Blazing, MD Requesting Physician: Dr Travis [name], D.   Reason for Consult: ESRD pt w/ STEMI  HPI: The patient is a 59 y.o. year-old w/ hx of HTN, PNA, OSA, ESRD on HD, anemia presented to ED this am w/ low-grade fevers, body aches and anterior chest tightness. No hx heart disease. Had booster shot 5 days ago on Monday. On Thursday began having fatigue, sweats, body aches and low grade fevers.  Pt came to ED where showed ST elevations in A, aVL and less so in V2-V4. Code STEMI called and pt went to cath lab which showed 99% LAD which was opened up and stented.  Has 99% OM / LCX and 80% RCA as well, not treated today. Pt back in ICU now, reportedly his filling pressures are high. Per chart LVEDP was 35 mm Hg.  Asked to see for ESRD.    Seen in ICU, RN states he has had a rough couple of hours post -cath this am and is just finally starting to settle down.    Has rec'd multiple po and IV BP lowering meds, asa this am, thorazine po, brilinta po, phenergan IV, IV hydralazine x 2, IV labetalol 10mg  x 3, metoprolol IV x 2, morphine x 1. Also earlier was getting IV ntg now dc'd.  They tried to give him his home BP meds but he threw them all up.    No CXR's done here yet. , have ordered.       ROS  no joint pain   no HA  no blurry vision  no rash  no diarrhea  no dysuria  no difficulty voiding  no change in urine color    Past Medical History  Past Medical History:  Diagnosis Date  . Anemia 01/30/2015  . Chronic kidney disease   . GERD (gastroesophageal reflux disease)    takes Nexium  . Hypertension   . Pneumonia    as a child  . Sleep apnea    uses c-pap 2 yrs   Past Surgical History  Past Surgical History:  Procedure Laterality Date  . AV FISTULA PLACEMENT Left 12/22/2017   Procedure: REPAIR PSEUDOANEURYSM ARTERIOVENOUS (AV) FISTULA;  Surgeon: [name], MD;  Location: [facility];   Service: Vascular;  Laterality: Left;  . BASCILIC VEIN TRANSPOSITION Left 01/05/2014   Procedure: LEFT 1ST STAGE BASCILIC VEIN TRANSPOSITION;  Surgeon: [name], MD;  Location: [facility];  Service: Vascular;  Laterality: Left;  . BASCILIC VEIN TRANSPOSITION Left 07/26/2014   Procedure: Left Arm Brachial Vein Transposition Second Stage;  Surgeon: [name], MD;  Location: [facility];  Service: Vascular;  Laterality: Left;  . COLONOSCOPY    . COLONOSCOPY N/A 02/02/2015   Procedure: COLONOSCOPY;  Surgeon: [name], MD;  Location: [facility] ENDOSCOPY;  Service: Endoscopy;  Laterality: N/A;  . ESOPHAGOGASTRODUODENOSCOPY Left 01/31/2015   Procedure: ESOPHAGOGASTRODUODENOSCOPY (EGD);  Surgeon: [name], MD;  Location: [facility] ENDOSCOPY;  Service: Endoscopy;  Laterality: Left;  . EVALUATION UNDER ANESTHESIA WITH HEMORRHOIDECTOMY N/A 02/26/2018   Procedure: ANORECTAL EXAM UNDER ANESTHESIA  HEMORRHOIDECTOMY x 2  HEMORRHOIDAL LIGATION AND PEXY;  Surgeon: [name], MD;  Location: [facility] ORS;  Service: General;  Laterality: N/A;  . GIVENS CAPSULE STUDY N/A 01/31/2015   Procedure: GIVENS CAPSULE STUDY;  Surgeon: [name], MD;  Location: [facility] ENDOSCOPY;  Service: Endoscopy;  Laterality: N/A;  . MOUTH SURGERY     teeth cleaning  .  NEPHRECTOMY RADICAL Right 05/2015   [facility] Dr Travis [name] for R renal mass   Family History  Family History  Problem Relation Age of Onset  . Hypertension Mother   . Hypertension Father   . Hypertension Sister    Social History  reports that he quit smoking about 7 years ago. His smoking use included cigars. He quit after 4.00 years of use. He has never used smokeless tobacco. He reports that he does not drink alcohol and does not use drugs. Allergies  Allergies  Allergen Reactions  . Ibuprofen Hives  . Lisinopril Swelling    PT states he is allergic to all prils; caused facial swelling  . Naproxen Hives and Other (See Comments)    Alleve causes patient to have hives   Home  medications Prior to Admission medications   Medication Sig Start Date End Date Taking? Authorizing Provider  acetaminophen (TYLENOL) 325 MG tablet Take 650 mg by mouth every 6 (six) hours as needed for mild pain, fever or headache.   Yes [provider]  AURYXIA 1 GM 210 MG(Fe) tablet Take 210-630 mg by mouth See admin instructions. 630mg  three times daily with meals and 210-420mg  with snacks 10/03/17  Yes [provider]  cinacalcet (SENSIPAR) 60 MG tablet Take 120 mg by mouth daily. 10/14/20  Yes [provider]  multivitamin (RENA-VIT) TABS tablet Take 1 tablet by mouth daily. 10/13/20  Yes [provider]  Vitamin D, Ergocalciferol, (DRISDOL) 1.25 MG (50000 UNIT) CAPS capsule Take 50,000 Units by mouth once a week. 10/17/20  Yes [provider]  iron polysaccharides (NIFEREX) 150 MG capsule Take 1 capsule (150 mg total) by mouth 2 (two) times daily. Patient not taking: Reported on 10/28/2020 02/02/15   Travis Dubois, MD  labetalol (NORMODYNE) 300 MG tablet Take 1 tablet (300 mg total) by mouth 2 (two) times daily. Patient not taking: Reported on 10/28/2020 02/02/15   Travis Dubois, MD     Vitals:   10/28/20 1210 10/28/20 1214 10/28/20 1215 10/28/20 1220  BP: (!) 168/96 (!) 159/86 (!) 159/86 (!) 163/85  Pulse: (!) 108 (!) 108 (!) 108 (!) 109  Resp: (!) 22 (!) 24 (!) 23 (!) 31  Temp:      TempSrc:      SpO2: 90% 91% 91% 91%  Weight:      Height:       Exam Gen alert, nasal O2, dry coughing, no distress No rash, cyanosis or gangrene Sclera anicteric, throat clear  No jvd or bruits Chest clear bilat to bases, no rales or wheezing RRR no MRG  Abd soft ntnd no mass or ascites +bs GU normal male MS no joint effusions or deformity Ext no leg or UE edema, no wounds or ulcers Neuro is alert, Ox 3 , nf  LUA AVF+bruit    Home meds:  - sensipar 120 qd/ ergocalciferol 50k q wk / auryxia 3 ac tid  - labetalol 300 bid  - niferex 150 bid  -  prn's/ vitamins/ supplements    OP HD: AF MWF   4h  450/800  81.5kg  2/2.25 bath  RUE AVF  Hep 5000+ 1027midrun   - hect 5 ug tiw   -  mircera 50 ug q 4, last 11/15 (due 11/29)    Assessment/ Plan: 1. Acute STEMI - SP LAD PCI this am by cards, has other LCx/ RCA disease. Per cardiology.  2. SOB - has pulm edema by CXR. Plan HD next patient.  3. ESRD -  usual HD MWF.  Had HD yest.  Very high LVEDP numbers (+3mmHg), will need fluid removal w/ dialysis.  4. HTN/ vol overload - LVEDP high at 35 and 5kg up today.  Not tolerating po bp meds d/t N/V. Getting IV meds > prn IV labetalol, hydralazine and metoprolol. Get vol down w/ HD this afternoon.  5. 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

## 2020-10-28 NOTE — Plan of Care (Signed)

## 2020-10-28 NOTE — ED Provider Notes (Signed)
TIME SEEN: 6:25 AM    CHIEF COMPLAINT: Chest pain  HPI: Patient is a 59 year old male with history of hypertension, end-stage renal disease on hemodialysis Monday, Wednesday and Friday, previous tobacco use quit 5 years ago who presents to the emergency department with diffuse anterior chest tightness that started at 5 AM this morning while at rest.  States pain goes into his back and feels like an aching.  He has had associated diaphoresis.  No shortness of breath, nausea, vomiting, dizziness.  No previous history of cardiac disease.  No history of stress test or cath.  Does not have a cardiologist.  States he received a pneumonia vaccination on Monday.  Reports on Thursday he started having body aches, chills, dry cough, hiccups.  He has had 3 COVID-19 vaccinations.  No known sick contacts.  ROS: See HPI Constitutional: no fever  Eyes: no drainage  ENT: no runny nose   Cardiovascular:  chest pain  Resp: no SOB  GI: no vomiting GU: no dysuria Integumentary: no rash  Allergy: no hives  Musculoskeletal: no leg swelling  Neurological: no slurred speech ROS otherwise negative  PAST MEDICAL HISTORY/PAST SURGICAL HISTORY:  Past Medical History:  Diagnosis Date  . Anemia 01/30/2015  . Chronic kidney disease   . GERD (gastroesophageal reflux disease)    takes Nexium  . Hypertension   . Pneumonia    as a child  . Sleep apnea    uses c-pap 2 yrs    MEDICATIONS:  Prior to Admission medications   Medication Sig Start Date End Date Taking? Authorizing Provider  AMBULATORY NON FORMULARY MEDICATION Place 1 application rectally 4 (four) times daily. DILTIAZEM 2% compounded suspension.  (Get Rx filled at a compounding pharmacy, such as [facility] or [facility] in [place], [place]) 02/26/18   Yardley Boston, MD  amLODipine (NORVASC) 10 MG tablet Take 10 mg by mouth daily.    [provider]  AURYXIA 1 GM 210 MG(Fe) tablet Take 210-630 mg by mouth See admin  instructions. 630mg  three times daily with meals and 210-420mg  with snacks 10/03/17   [provider]  iron polysaccharides (NIFEREX) 150 MG capsule Take 1 capsule (150 mg total) by mouth 2 (two) times daily. 02/02/15   [name], MD  labetalol (NORMODYNE) 300 MG tablet Take 1 tablet (300 mg total) by mouth 2 (two) times daily. 02/02/15   [name], MD  oxyCODONE (OXY IR/ROXICODONE) 5 MG immediate release tablet Take 1-2 tablets (5-10 mg total) by mouth every 6 (six) hours as needed for moderate pain, severe pain or breakthrough pain. 02/26/18   Ozzie Boston, MD  tamsulosin (FLOMAX) 0.4 MG CAPS capsule Take 0.4 mg by mouth daily. 01/19/18   [provider]  VIAGRA 50 MG tablet Take 25 mg by mouth as needed for erectile dysfunction.  08/20/14   [provider]    ALLERGIES:  Allergies  Allergen Reactions  . Ibuprofen Hives  . Lisinopril Swelling    PT states he is allergic to all prils; caused facial swelling  . Naproxen Hives and Other (See Comments)    Alleve causes patient to have hives    SOCIAL HISTORY:  Social History   Tobacco Use  . Smoking status: Former Smoker    Years: 4.00    Types: Cigars    Quit date: 12/31/2012    Years since quitting: 7.8  . Smokeless tobacco: Never Used  Substance Use Topics  . Alcohol use: No    FAMILY HISTORY:  Family History  Problem Relation Age of Onset  . Hypertension Mother   . Hypertension Father   . Hypertension Sister     EXAM: BP (!) 158/89 (BP Location: Right Arm)   Pulse 86   Temp 100 F (37.8 C) (Oral)   Resp 17   Ht 5\' 11"  (1.803 m)   Wt 86.2 kg   SpO2 100%   BMI 26.50 kg/m  CONSTITUTIONAL: Alert and oriented and responds appropriately to questions.  Appears uncomfortable but not in distress HEAD: Normocephalic EYES: Conjunctivae clear, pupils appear equal, EOM appear intact ENT: normal nose; moist mucous membranes NECK: Supple, normal ROM CARD: RRR; S1 and S2 appreciated; no  murmurs, no clicks, no rubs, no gallops RESP: Normal chest excursion without splinting or tachypnea; breath sounds clear and equal bilaterally; no wheezes, no rhonchi, no rales, no hypoxia or respiratory distress, speaking full sentences ABD/GI: Normal bowel sounds; non-distended; soft, non-tender, no rebound, no guarding, no peritoneal signs, no hepatosplenomegaly BACK:  The back appears normal EXT: Normal ROM in all joints; no deformity noted, no edema; no cyanosis; 2+ radial and DP pulses bilaterally, fistula in the left upper extremity with good thrill and bruit with no tenderness, redness, swelling, warmth; no calf tenderness or calf swelling on exam SKIN: Normal color for age and race; warm; no rash on exposed skin NEURO: Moves all extremities equally PSYCH: The patient's mood and manner are appropriate.   MEDICAL DECISION MAKING: Patient here with chest pain that started at 5 AM.  EKG shows anterior lateral STEMI.  Spoke with cardiology fellow Dr. Einar Gip and attending Dr. [name].  They are on their way to see patient.  Code STEMI activated.  Patient receiving aspirin and heparin.  Blood pressure stable.  ED PROGRESS: Pain improved with NTG.  Pt to cath lab.  Having hiccups.  Will give thorazine.  I reviewed all nursing notes and pertinent previous records as available.  I have reviewed and interpreted any EKGs, lab and urine results, imaging (as available).     Date: 10/28/2020 6:19 AM  Rate: 91  Rhythm: normal sinus rhythm  QRS Axis: normal  Intervals: normal  ST/T Wave abnormalities: ST elevation in anterior lateral leads with reciprocal depression in inferior leads concerning for anterolateral STEMI  Conduction Disutrbances: none  Narrative Interpretation: Anterior lateral STEMI        EKG Interpretation  Date/Time:  Saturday October 28 2020 06:33:09 EST Ventricular Rate:  89 PR Interval:  174 QRS Duration: 88 QT Interval:  380 QTC Calculation: 462 R  Axis:   44 Text Interpretation: Sinus rhythm with Premature atrial complexes ST elevation consider anterolateral injury or acute infarct ** ** ACUTE MI / STEMI ** ** Abnormal ECG Confirmed by Pryor Curia [phone]) on 10/28/2020 6:45:37 AM         CRITICAL CARE Performed by: Cyril Mourning Rhonna Holster   Total critical care time: 40 minutes  Critical care time was exclusive of separately billable procedures and treating other patients.  Critical care was necessary to treat or prevent imminent or life-threatening deterioration.  Critical care was time spent personally by me on the following activities: development of treatment plan with patient and/or surrogate as well as nursing, discussions with consultants, evaluation of patient's response to treatment, examination of patient, obtaining history from patient or surrogate, ordering and performing treatments and interventions, ordering and review of laboratory studies, ordering and review of radiographic studies, pulse oximetry and re-evaluation of patient's condition.   Travis [name] was evaluated in  Emergency Department on 10/28/2020 for the symptoms described in the history of present illness. He was evaluated in the context of the global COVID-19 pandemic, which necessitated consideration that the patient might be at risk for infection with the SARS-CoV-2 virus that causes COVID-19. Institutional protocols and algorithms that pertain to the evaluation of patients at risk for COVID-19 are in a state of rapid change based on information released by regulatory bodies including the CDC and federal and state organizations. These policies and algorithms were followed during the patient's care in the ED.      Claritza July, Delice Bison, DO 10/28/20 [phone]

## 2020-10-28 NOTE — ED Notes (Signed)
Came up to triage window and stated something didn't feel right, diaphoretic EKG done and shown to EDP .

## 2020-10-28 NOTE — ED Notes (Signed)
Cardiology at bedside.

## 2020-10-28 NOTE — ED Notes (Signed)
Pt already placed on zoll.

## 2020-10-28 NOTE — ED Notes (Signed)
Dr. Leonides Schanz on the phone with cardiology. Cardiology on the way to assess pt.

## 2020-10-28 NOTE — Progress Notes (Signed)
ANTICOAGULATION CONSULT NOTE - Initial Consult  Pharmacy Consult for heparin Indication: chest pain/ACS  Allergies  Allergen Reactions  . Ibuprofen Hives  . Lisinopril Swelling    PT states he is allergic to all prils; caused facial swelling  . Naproxen Hives and Other (See Comments)    Alleve causes patient to have hives    Patient Measurements: Height: 5\' 11"  (180.3 cm) Weight: 86.4 kg (190 lb 7.6 oz) IBW/kg (Calculated) : 75.3 Heparin Dosing Weight: 86.4 kg   Vital Signs: Temp: 100 F (37.8 C) (11/20 0550) Temp Source: Oral (11/20 0550) BP: 148/92 (11/20 0945) Pulse Rate: 80 (11/20 0854)  Labs: Recent Labs    10/28/20 0604 10/28/20 0629  HGB 9.5*  --   HCT 29.5*  --   PLT 143*  --   APTT  --  36  LABPROT  --  15.3*  INR  --  1.3*  CREATININE 9.73*  --   TROPONINIHS  --  3,155*    Estimated Creatinine Clearance: 8.8 mL/min (A) (by C-G formula based on SCr of 9.73 mg/dL (H)).   Medical History: Past Medical History:  Diagnosis Date  . Anemia 01/30/2015  . Chronic kidney disease   . GERD (gastroesophageal reflux disease)    takes Nexium  . Hypertension   . Pneumonia    as a child  . Sleep apnea    uses c-pap 2 yrs    Medications:  Scheduled:  . amLODipine  10 mg Oral Daily  . [START ON 10/29/2020] aspirin  81 mg Oral Daily  . atorvastatin  80 mg Oral q1800  . ferric citrate  210-630 mg Oral See admin instructions  . iron polysaccharides  150 mg Oral BID  . labetalol  300 mg Oral BID  . sodium chloride flush  3 mL Intravenous Q12H  . tamsulosin  0.4 mg Oral Daily  . ticagrelor  90 mg Oral BID    Assessment: 32 yom presenting with STEMI. Known to have dilated ascending aorta, ESRD. Found to have 99% mid LAD with multivessel in LCx and RCA, now s/p DES to LAD. Plan for heparin restart 8 hr after sheath removal (not yet removed - using to monitor during HD), possible staged PCI of RCA and LCx.   Hgb 9.5, plt 143. Trop 3155. No s/sx of bleeding.    Goal of Therapy:  Heparin level 0.3-0.7 units/ml Monitor platelets by anticoagulation protocol: Yes   Plan:  Start heparin infusion at 1000 units/hr once sheath pulled Check anti-Xa level in 8 hours and daily while on heparin Continue to monitor H&H and platelets  Antonietta Jewel, PharmD, [place] Pharmacist  Phone: [phone] 10/28/2020 10:07 AM  Please check AMION for all [facility] phone numbers After 10:00 PM, call [facility] [phone]

## 2020-10-28 NOTE — Progress Notes (Signed)
[facility] for heparin Indication: chest pain/ACS  Allergies  Allergen Reactions  . Ibuprofen Hives  . Lisinopril Swelling    PT states he is allergic to all prils; caused facial swelling  . Naproxen Hives and Other (See Comments)    Alleve causes patient to have hives    Patient Measurements: Height: 5\' 11"  (180.3 cm) Weight: 86.4 kg (190 lb 7.6 oz) IBW/kg (Calculated) : 75.3 Heparin Dosing Weight: 86.4 kg   Vital Signs: Temp: 102 F (38.9 C) (11/20 1921) Temp Source: Oral (11/20 1921) BP: 119/88 (11/20 2000) Pulse Rate: 108 (11/20 2000)  Labs: Recent Labs    10/28/20 0604 10/28/20 0629 10/28/20 1046  HGB 9.5*  --   --   HCT 29.5*  --   --   PLT 143*  --   --   APTT  --  36  --   LABPROT  --  15.3*  --   INR  --  1.3*  --   CREATININE 9.73*  --   --   TROPONINIHS  --  3,155* >27,000*    Estimated Creatinine Clearance: 8.8 mL/min (A) (by C-G formula based on SCr of 9.73 mg/dL (H)).   Medical History: Past Medical History:  Diagnosis Date  . Anemia 01/30/2015  . Chronic kidney disease   . GERD (gastroesophageal reflux disease)    takes Nexium  . Hypertension   . Pneumonia    as a child  . Sleep apnea    uses c-pap 2 yrs    Medications:  Scheduled:  . atropine      . amLODipine  10 mg Oral Daily  . [START ON 10/29/2020] aspirin  81 mg Oral Daily  . atorvastatin  80 mg Oral q1800  . [START ON 10/29/2020] Chlorhexidine Gluconate Cloth  6 each Topical Q0600  . [START ON 10/30/2020] doxercalciferol  5 mcg Intravenous Q M,W,F-HD  . labetalol  300 mg Oral BID  . mouth rinse  15 mL Mouth Rinse BID  . mupirocin ointment  1 application Nasal BID  . sodium chloride flush  3 mL Intravenous Q12H  . ticagrelor  90 mg Oral BID    Assessment: 14 yom presenting with STEMI. Known to have dilated ascending aorta, ESRD. Found to have 99% mid LAD with multivessel in LCx and RCA, now s/p DES to LAD. Plan for heparin restart 8 hr  after sheath removal (not yet removed - using to monitor during HD), possible staged PCI of RCA and LCx.   Sheath removed ~2100 tonight, will start heparin at 0500 on 11/21.  Goal of Therapy:  Heparin level 0.3-0.7 units/ml Monitor platelets by anticoagulation protocol: Yes   Plan:  Start heparin infusion at 1000 units/hr 11/21 at [facility] heparin level after resuming  Arrie Senate, PharmD, BCPS Clinical Pharmacist [phone] Please check AMION for all [facility] numbers 10/28/2020

## 2020-10-28 NOTE — ED Notes (Signed)
Called wife and left a message to call ED.

## 2020-10-28 NOTE — H&P (Signed)
CARDIOLOGY CONSULT NOTE  Patient ID: Travis [name] MRN: [id number] DOB/AGE: 1961-07-10 59 y.o.  Admit date: 10/28/2020 Primary Physician Dr. Cyril Mourning Ward Primary Cardiologist [facility] Chief Complaint  Chest tightness Requesting  Dr. 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 He describes his chest tightness, 7/10, with radiation to his back (between his scapula), he has not exerted. EKG in ED is suggestive of anterolateral ST elevations. COVID pending. In the ED he was given ASA 324mg  x1, SL nitro and thorazine for hickups.   He is on transplant list at [place] and gets yearly stress tests. Last stress test 11/16/2019.  Patient has been complaining of general unwellness with temp 99.9, myalgias and fatigue after recent pneumonia vaccination.   Trop pending COVID pending  Past Medical History:  Diagnosis Date   Anemia 01/30/2015   Chronic kidney disease    GERD (gastroesophageal reflux disease)    takes Nexium   Hypertension    Pneumonia    as a child   Sleep apnea    uses c-pap 2 yrs    Past Surgical History:  Procedure Laterality Date   AV FISTULA PLACEMENT Left 12/22/2017   Procedure: REPAIR PSEUDOANEURYSM ARTERIOVENOUS (AV) FISTULA;  Surgeon: [name], MD;  Location: [place];  Service: Vascular;  Laterality: Left;   [place] Left 01/05/2014   Procedure: LEFT 1ST STAGE [place];  Surgeon: Conrad Annada, MD;  Location: [place];  Service: Vascular;  Laterality: Left;   [place] Left 07/26/2014   Procedure: Left Arm Brachial Vein Transposition Second Stage;  Surgeon: Conrad Humphreys, MD;  Location: [place];  Service: Vascular;  Laterality: Left;   COLONOSCOPY     COLONOSCOPY N/A 02/02/2015   Procedure: COLONOSCOPY;  Surgeon: [name], MD;  Location: [facility] ENDOSCOPY;  Service: Endoscopy;  Laterality: N/A;   ESOPHAGOGASTRODUODENOSCOPY Left 01/31/2015   Procedure: ESOPHAGOGASTRODUODENOSCOPY (EGD);  Surgeon: [name], MD;  Location[facility] ENDOSCOPY;  Service: Endoscopy;  Laterality: Left;   EVALUATION UNDER ANESTHESIA WITH HEMORRHOIDECTOMY N/A 02/26/2018   Procedure: ANORECTAL EXAM UNDER ANESTHESIA  HEMORRHOIDECTOMY x 2  HEMORRHOIDAL LIGATION AND PEXY;  Surgeon: Coreon Boston, MD;  Location: [facility];  Service: General;  Laterality: N/A;   GIVENS CAPSULE STUDY N/A 01/31/2015   Procedure: GIVENS CAPSULE STUDY;  Surgeon: [name], MD;  Location: [facility] ENDOSCOPY;  Service: Endoscopy;  Laterality: N/A;   MOUTH SURGERY     teeth cleaning   NEPHRECTOMY RADICAL Right 05/2015   [facility] Dr [name] for R renal mass    Allergies  Allergen Reactions   Ibuprofen Hives   Lisinopril Swelling    PT states he is allergic to all prils; caused facial swelling   Naproxen Hives and Other (See Comments)    Alleve causes patient to have hives   (Not in a hospital admission)  Family History  Problem Relation Age of Onset   Hypertension Mother    Hypertension Father    Hypertension Sister     Social History   Socioeconomic History   Marital status: Married    Spouse name: Not on file   Number of children: Not on file   Years of education: Not on file   Highest education level: Not on file  Occupational History   Not  on file  Tobacco Use   Smoking status: Former Smoker    Years: 4.00    Types: Cigars    Quit date: 12/31/2012    Years since quitting: 7.8   Smokeless tobacco: Never Used  Vaping Use   Vaping Use: Never used  Substance and Sexual Activity   Alcohol use: No   Drug use: No   Sexual activity: Not on file  Other Topics Concern   Not on file  Social History Narrative   Not on file   Social Determinants of Health   Financial Resource Strain:    Difficulty of Paying Living Expenses: Not  on file  Food Insecurity:    Worried About Charity fundraiser in the Last Year: Not on file   [employer] of Food in the Last Year: Not on file  Transportation Needs:    Lack of Transportation (Medical): Not on file   Lack of Transportation (Non-Medical): Not on file  Physical Activity:    Days of Exercise per Week: Not on file   Minutes of Exercise per Session: Not on file  Stress:    Feeling of Stress : Not on file  Social Connections:    Frequency of Communication with Friends and Family: Not on file   Frequency of Social Gatherings with Friends and Family: Not on file   Attends Religious Services: Not on file   Active Member of Clubs or Organizations: Not on file   Attends Archivist Meetings: Not on file   Marital Status: Not on file  Intimate Partner Violence:    Fear of Current or Ex-Partner: Not on file   Emotionally Abused: Not on file   Physically Abused: Not on file   Sexually Abused: Not on file     Review of Systems: [y] = yes, [ ]  = no       General: Weight gain [] ; Weight loss [ ] ; Anorexia [ ] ; Fatigue [ ] ; Fever [ ] ; Chills [ ] ; Weakness [ ]     Cardiac: Chest pain/pressure [x ]; Resting SOB [ ] ; Exertional SOB [ ] ; Orthopnea [ ] ; Pedal Edema [ ] ; Palpitations [ ] ; Syncope [ ] ; Presyncope [ ] ; Paroxysmal nocturnal dyspnea[ ]     Pulmonary: Cough [ ] ; Wheezing[ ] ; Hemoptysis[ ] ; Sputum [ ] ; Snoring [ ]     GI: Vomiting[ ] ; Dysphagia[ ] ; Melena[ ] ; Hematochezia [ ] ; Heartburn[ ] ; Abdominal pain [ ] ; Constipation [ ] ; Diarrhea [ ] ; BRBPR [ ]     GU: Hematuria[ ] ; Dysuria [ ] ; Nocturia[ ]   Vascular: Pain in legs with walking [ ] ; Pain in feet with lying flat [ ] ; Non-healing sores [ ] ; Stroke [ ] ; TIA [ ] ; Slurred speech [ ] ;    Neuro: Headaches[ ] ; Vertigo[ ] ; Seizures[ ] ; Paresthesias[ ] ;Blurred vision [ ] ; Diplopia [ ] ; Vision changes [ ]     Ortho/Skin: Arthritis [ ] ; Joint pain [ ] ; Muscle pain [ ] ; Joint swelling [ ] ; Back Pain [ ] ;  Rash [ ]     Psych: Depression[ ] ; Anxiety[ ]     Heme: Bleeding problems [ ] ; Clotting disorders [ ] ; Anemia [ ]     Endocrine: Diabetes [ ] ; Thyroid dysfunction[ ]   Physical Exam: Blood pressure (!) 166/96, pulse 88, temperature 100 F (37.8 C), temperature source Oral, resp. rate (!) 21, height 5\' 11"  (1.803 m), weight 86.2 kg, SpO2 100 %.   GENERAL: Patient is afebrile, Vital signs reviewed, Well appearing, Patient appears comfortable, Alert and lucid.  EYES: Normal inspection. HEENT:  normocephalic, atraumatic , normal ENT inspection. ORAL:  Moist NECK:  supple , normal inspection. CARD:  regular rate and rhythm, heart sounds normal. RESP:  no respiratory distress, breath sounds normal. ABD: soft, tender to palpation, BS present, soft, no organomegaly or masses . BACK: non-tender. No CVA tenderness. MUSC:  normal ROM, non-tender , no pedal edema . SKIN: color normal, no rash, warm, dry . NEURO: awake & alert, lucid, no motor/sensory deficit. Gait stable. PSYCH: mood/affect normal.  Labs: Lab Results  Component Value Date   BUN 31 (H) 02/26/2018   Lab Results  Component Value Date   CREATININE 10.15 (H) 02/26/2018   Lab Results  Component Value Date   NA 142 02/26/2018   K 4.5 02/26/2018   CL 97 (L) 02/26/2018   CO2 33 (H) 02/26/2018   Lab Results  Component Value Date   TROPONINI <0.30 11/13/2012   Lab Results  Component Value Date   WBC 12.7 (H) 10/28/2020   HGB 9.5 (L) 10/28/2020   HCT 29.5 (L) 10/28/2020   MCV 95.5 10/28/2020   PLT 143 (L) 10/28/2020   Lab Results  Component Value Date   CHOL 203 (H) 11/13/2012   HDL 41 11/13/2012   LDLCALC 125 (H) 11/13/2012   TRIG 184 (H) 11/13/2012   CHOLHDL 5.0 11/13/2012   Lab Results  Component Value Date   ALT 28 11/13/2012   AST 26 11/13/2012   ALKPHOS 136 (H) 11/13/2012   BILITOT 0.4 11/13/2012      Radiology:   CXR: Pending   - Low risk probably normal myocardial perfusion study  - There is a  small in size, mild in severity, mostly fixed defect involving the apical and apical inferior segments. This is consistent with probable artifact and less likely due to subtle scar.  - Post stress: The ejection fraction calculated at 53%.  - Coronary calcifications are noted  - The ascending aorta is dilated measuring approximately 4.5 cm.    ASSESSMENT:  Travis [name] is  59 yo AAM w/ PMH significant for HTN, ESRD on HD via RUE AV fistula and smoking hx presents with anterolateral STEMI  1. Anterolateral STEMI - NPO - Plan for LHC/CA +/- PCI - ASA - Echo    Signed: Nika Yazzie 10/28/2020, 6:59 AM

## 2020-10-28 NOTE — ED Triage Notes (Signed)
Patient is a dialysis patient M-W-F states he got the pneumonia shot on Monday , states Thurs started running a low grade fever with hiscupps, and generalized upper torso muscle aches, states he had dialysis yest and MD was aware of his low grade temp and told to take Tylenol. C/o cough , states he was scheduled to have a covid  Test today.

## 2020-10-28 NOTE — Progress Notes (Signed)
Assisted tele visit to patient with daughter.  Alver Sorrow, RN

## 2020-10-28 NOTE — Progress Notes (Signed)
This RN removed right femoral sheath from patient. Starting at 2001, this RN held firm pressure until 2021, gradually decreasing pressure as time went on.  Vitals taken every 3 minutes and remained stable throughout. Femoral pulse and distal pulses remained palpable throughout.  Patient tolerated well.  Site is a level zero. RN will continue to monitor closely.

## 2020-10-29 DIAGNOSIS — I25110 Atherosclerotic heart disease of native coronary artery with unstable angina pectoris: Secondary | ICD-10-CM | POA: Diagnosis not present

## 2020-10-29 DIAGNOSIS — R579 Shock, unspecified: Secondary | ICD-10-CM | POA: Diagnosis not present

## 2020-10-29 DIAGNOSIS — I2109 ST elevation (STEMI) myocardial infarction involving other coronary artery of anterior wall: Secondary | ICD-10-CM

## 2020-10-29 DIAGNOSIS — I10 Essential (primary) hypertension: Secondary | ICD-10-CM | POA: Diagnosis not present

## 2020-10-29 DIAGNOSIS — I4891 Unspecified atrial fibrillation: Secondary | ICD-10-CM | POA: Diagnosis not present

## 2020-10-29 LAB — BASIC METABOLIC PANEL
Anion gap: 16 — ABNORMAL HIGH (ref 5–15)
Anion gap: 13 (ref 5–15)
BUN: 33 mg/dL — ABNORMAL HIGH (ref 6–20)
BUN: 48 mg/dL — ABNORMAL HIGH (ref 6–20)
CO2: 28 mmol/L (ref 22–32)
CO2: 20 mmol/L — ABNORMAL LOW (ref 22–32)
Calcium: 9.7 mg/dL (ref 8.9–10.3)
Calcium: 6.5 mg/dL — ABNORMAL LOW (ref 8.9–10.3)
Chloride: 88 mmol/L — ABNORMAL LOW (ref 98–111)
Chloride: 99 mmol/L (ref 98–111)
Creatinine, Ser: 7.75 mg/dL — ABNORMAL HIGH (ref 0.61–1.24)
Creatinine, Ser: 7.33 mg/dL — ABNORMAL HIGH (ref 0.61–1.24)
GFR, Estimated: 7 mL/min — ABNORMAL LOW (ref >60)
GFR, Estimated: 8 mL/min — ABNORMAL LOW (ref >60)
Glucose, Bld: 143 mg/dL — ABNORMAL HIGH (ref 70–99)
Glucose, Bld: 208 mg/dL — ABNORMAL HIGH (ref 70–99)
Potassium: 5.1 mmol/L (ref 3.5–5.1)
Potassium: 3.1 mmol/L — ABNORMAL LOW (ref 3.5–5.1)
Sodium: 132 mmol/L — ABNORMAL LOW (ref 135–145)
Sodium: 132 mmol/L — ABNORMAL LOW (ref 135–145)

## 2020-10-29 LAB — BLOOD CULTURE ID PANEL (REFLEXED) - BCID2

## 2020-10-29 LAB — CBC WITH DIFFERENTIAL/PLATELET
Abs Immature Granulocytes: 0.13 10*3/uL — ABNORMAL HIGH (ref 0.00–0.07)
Basophils Absolute: 0 10*3/uL (ref 0.0–0.1)
Basophils Relative: 0 %
Eosinophils Absolute: 0 10*3/uL (ref 0.0–0.5)
Eosinophils Relative: 0 %
HCT: 18.8 % — ABNORMAL LOW (ref 39.0–52.0)
Hemoglobin: 6 g/dL — CL (ref 13.0–17.0)
Immature Granulocytes: 1 %
Lymphocytes Relative: 2 %
Lymphs Abs: 0.3 10*3/uL — ABNORMAL LOW (ref 0.7–4.0)
MCH: 29.9 pg (ref 26.0–34.0)
MCHC: 31.9 g/dL (ref 30.0–36.0)
MCV: 93.5 fL (ref 80.0–100.0)
Monocytes Absolute: 0.7 10*3/uL (ref 0.1–1.0)
Monocytes Relative: 6 %
Neutro Abs: 11.8 10*3/uL — ABNORMAL HIGH (ref 1.7–7.7)
Neutrophils Relative %: 91 %
Platelets: 86 10*3/uL — ABNORMAL LOW (ref 150–400)
RBC: 2.01 MIL/uL — ABNORMAL LOW (ref 4.22–5.81)
RDW: 14.5 % (ref 11.5–15.5)
WBC: 12.9 10*3/uL — ABNORMAL HIGH (ref 4.0–10.5)
nRBC: 0 % (ref 0.0–0.2)

## 2020-10-29 LAB — CBC
HCT: 31.1 % — ABNORMAL LOW (ref 39.0–52.0)
Hemoglobin: 9.9 g/dL — ABNORMAL LOW (ref 13.0–17.0)
MCH: 30.1 pg (ref 26.0–34.0)
MCHC: 31.8 g/dL (ref 30.0–36.0)
MCV: 94.5 fL (ref 80.0–100.0)
Platelets: 121 10*3/uL — ABNORMAL LOW (ref 150–400)
RBC: 3.29 MIL/uL — ABNORMAL LOW (ref 4.22–5.81)
RDW: 14 % (ref 11.5–15.5)
WBC: 14.9 10*3/uL — ABNORMAL HIGH (ref 4.0–10.5)
nRBC: 0 % (ref 0.0–0.2)

## 2020-10-29 LAB — LIPID PANEL
Cholesterol: 151 mg/dL (ref 0–200)
HDL: 13 mg/dL — ABNORMAL LOW (ref >40)
LDL Cholesterol: 61 mg/dL (ref 0–99)
Total CHOL/HDL Ratio: 11.6 RATIO
Triglycerides: 385 mg/dL — ABNORMAL HIGH (ref <150)
VLDL: 77 mg/dL — ABNORMAL HIGH (ref 0–40)

## 2020-10-29 LAB — POCT I-STAT 7, (LYTES, BLD GAS, ICA,H+H)
Acid-Base Excess: 4 mmol/L — ABNORMAL HIGH (ref 0.0–2.0)
Bicarbonate: 26.6 mmol/L (ref 20.0–28.0)
Calcium, Ion: 1.2 mmol/L (ref 1.15–1.40)
HCT: 26 % — ABNORMAL LOW (ref 39.0–52.0)
Hemoglobin: 8.8 g/dL — ABNORMAL LOW (ref 13.0–17.0)
O2 Saturation: 94 %
Patient temperature: 99.9
Potassium: 4.8 mmol/L (ref 3.5–5.1)
Sodium: 126 mmol/L — ABNORMAL LOW (ref 135–145)
TCO2: 28 mmol/L (ref 22–32)
pCO2 arterial: 34.8 mmHg (ref 32.0–48.0)
pH, Arterial: 7.495 — ABNORMAL HIGH (ref 7.350–7.450)
pO2, Arterial: 68 mmHg — ABNORMAL LOW (ref 83.0–108.0)

## 2020-10-29 LAB — HEPARIN LEVEL (UNFRACTIONATED): Heparin Unfractionated: <0.1 IU/mL — ABNORMAL LOW (ref 0.30–0.70)

## 2020-10-29 LAB — TROPONIN I (HIGH SENSITIVITY): Troponin I (High Sensitivity): >27000 ng/L (ref <18)

## 2020-10-29 LAB — LACTIC ACID, PLASMA: Lactic Acid, Venous: 2.5 mmol/L (ref 0.5–1.9)

## 2020-10-29 MED ORDER — NOREPINEPHRINE 16 MG/250ML-% IV SOLN
0.0000 ug/min | INTRAVENOUS | Status: DC
  Administered 2020-10-29: 20 ug/min via INTRAVENOUS
  Administered 2020-10-30: 15 ug/min via INTRAVENOUS
  Administered 2020-10-31: 14 ug/min via INTRAVENOUS
  Administered 2020-11-01: 30 ug/min via INTRAVENOUS
  Administered 2020-11-01: 20 ug/min via INTRAVENOUS
  Administered 2020-11-02: 28 ug/min via INTRAVENOUS
  Administered 2020-11-02: 35 ug/min via INTRAVENOUS
  Administered 2020-11-03: 12 ug/min via INTRAVENOUS
  Administered 2020-11-03 – 2020-11-05 (×2): 24 ug/min via INTRAVENOUS
  Administered 2020-11-08 (×2): 20 ug/min via INTRAVENOUS
  Administered 2020-11-09: 7 ug/min via INTRAVENOUS
  Administered 2020-11-12: 2 ug/min via INTRAVENOUS
  Filled 2020-10-29 (×19): qty 250

## 2020-10-29 MED ORDER — AMIODARONE HCL IN DEXTROSE 360-4.14 MG/200ML-% IV SOLN
60.0000 mg/h | INTRAVENOUS | Status: AC
  Administered 2020-10-29: 60 mg/h via INTRAVENOUS
  Filled 2020-10-29: qty 200

## 2020-10-29 MED ORDER — LORAZEPAM 2 MG/ML IJ SOLN
INTRAMUSCULAR | Status: AC
  Filled 2020-10-29: qty 1

## 2020-10-29 MED ORDER — LORAZEPAM 2 MG/ML IJ SOLN: 1.0000 mg | Freq: Once | INTRAMUSCULAR | Status: AC

## 2020-10-29 MED ORDER — ADENOSINE 6 MG/2ML IV SOLN
INTRAVENOUS | Status: AC
  Administered 2020-10-29: 12 mg via INTRAVENOUS
  Filled 2020-10-29: qty 2

## 2020-10-29 MED ORDER — AMIODARONE LOAD VIA INFUSION
150.0000 mg | Freq: Once | INTRAVENOUS | Status: AC
  Administered 2020-10-29: 150 mg via INTRAVENOUS
  Filled 2020-10-29: qty 83.34

## 2020-10-29 MED ORDER — CEFAZOLIN SODIUM-DEXTROSE 1-4 GM/50ML-% IV SOLN
1.0000 g | Freq: Every day | INTRAVENOUS | Status: DC
  Filled 2020-10-29: qty 50

## 2020-10-29 MED ORDER — AMIODARONE HCL IN DEXTROSE 360-4.14 MG/200ML-% IV SOLN
INTRAVENOUS | Status: AC
  Filled 2020-10-29: qty 400

## 2020-10-29 MED ORDER — ADENOSINE 6 MG/2ML IV SOLN: 6.0000 mg | Freq: Once | INTRAVENOUS | Status: AC

## 2020-10-29 MED ORDER — MIDAZOLAM HCL 2 MG/2ML IJ SOLN
INTRAMUSCULAR | Status: AC
  Filled 2020-10-29: qty 4

## 2020-10-29 MED ORDER — PIPERACILLIN-TAZOBACTAM IN DEX 2-0.25 GM/50ML IV SOLN
2.2500 g | Freq: Three times a day (TID) | INTRAVENOUS | Status: DC
  Administered 2020-10-29: 2.25 g via INTRAVENOUS
  Filled 2020-10-29 (×2): qty 50

## 2020-10-29 MED ORDER — ADENOSINE 6 MG/2ML IV SOLN
INTRAVENOUS | Status: AC
  Administered 2020-10-29: 12 mg via INTRAVENOUS
  Filled 2020-10-29: qty 4

## 2020-10-29 MED ORDER — NOREPINEPHRINE 4 MG/250ML-% IV SOLN: 0.0000 ug/min | INTRAVENOUS | Status: DC

## 2020-10-29 MED ORDER — PHENYLEPHRINE HCL-NACL 10-0.9 MG/250ML-% IV SOLN
INTRAVENOUS | Status: AC
  Administered 2020-10-29: 25 ug/min via INTRAVENOUS
  Filled 2020-10-29: qty 250

## 2020-10-29 MED ORDER — FERRIC CITRATE 1 GM 210 MG(FE) PO TABS
630.0000 mg | ORAL_TABLET | Freq: Three times a day (TID) | ORAL | Status: DC
  Administered 2020-10-30 – 2020-10-31 (×5): 630 mg via ORAL
  Filled 2020-10-29 (×16): qty 3

## 2020-10-29 MED ORDER — FAMOTIDINE 20 MG PO TABS
10.0000 mg | ORAL_TABLET | Freq: Every day | ORAL | Status: DC
  Administered 2020-10-30 – 2020-11-01 (×3): 10 mg via ORAL
  Filled 2020-10-29 (×3): qty 1

## 2020-10-29 MED ORDER — "THROMBI-PAD 3""X3"" EX PADS"
1.0000 | MEDICATED_PAD | Freq: Once | CUTANEOUS | Status: DC
  Filled 2020-10-29: qty 1

## 2020-10-29 MED ORDER — PHENYLEPHRINE HCL-NACL 10-0.9 MG/250ML-% IV SOLN
25.0000 ug/min | INTRAVENOUS | Status: DC
  Filled 2020-10-29 (×2): qty 250

## 2020-10-29 MED ORDER — ADENOSINE 6 MG/2ML IV SOLN
INTRAVENOUS | Status: AC
  Administered 2020-10-29: 6 mg via INTRAVENOUS
  Filled 2020-10-29: qty 2

## 2020-10-29 MED ORDER — ROCURONIUM BROMIDE 10 MG/ML (PF) SYRINGE
PREFILLED_SYRINGE | INTRAVENOUS | Status: AC
  Filled 2020-10-29: qty 10

## 2020-10-29 MED ORDER — ETOMIDATE 2 MG/ML IV SOLN
INTRAVENOUS | Status: AC
  Filled 2020-10-29: qty 20

## 2020-10-29 MED ORDER — AMIODARONE HCL IN DEXTROSE 360-4.14 MG/200ML-% IV SOLN
INTRAVENOUS | Status: AC
  Filled 2020-10-29: qty 200

## 2020-10-29 MED ORDER — SODIUM CHLORIDE 0.9 % IV SOLN
250.0000 mL | INTRAVENOUS | Status: DC
  Administered 2020-10-29 – 2020-11-02 (×2): 250 mL via INTRAVENOUS

## 2020-10-29 MED ORDER — METOPROLOL TARTRATE 5 MG/5ML IV SOLN: 5.0000 mg | Freq: Once | INTRAVENOUS | Status: AC

## 2020-10-29 MED ORDER — SODIUM CHLORIDE 0.9 % IV SOLN
25.0000 mg | Freq: Once | INTRAVENOUS | Status: AC
  Administered 2020-10-29: 25 mg via INTRAVENOUS
  Filled 2020-10-29: qty 1

## 2020-10-29 MED ORDER — CEFAZOLIN SODIUM-DEXTROSE 1-4 GM/50ML-% IV SOLN
1.0000 g | Freq: Every day | INTRAVENOUS | Status: DC
  Administered 2020-10-29 – 2020-10-31 (×3): 1 g via INTRAVENOUS
  Filled 2020-10-29 (×3): qty 50

## 2020-10-29 MED ORDER — SODIUM CHLORIDE 0.9 % IV BOLUS
250.0000 mL | Freq: Once | INTRAVENOUS | Status: AC
  Administered 2020-10-29: 250 mL via INTRAVENOUS

## 2020-10-29 MED ORDER — AMIODARONE HCL IN DEXTROSE 360-4.14 MG/200ML-% IV SOLN
30.0000 mg/h | INTRAVENOUS | Status: AC
  Administered 2020-10-30 – 2020-10-31 (×4): 30 mg/h via INTRAVENOUS
  Filled 2020-10-29 (×4): qty 200

## 2020-10-29 MED ORDER — METOPROLOL TARTRATE 5 MG/5ML IV SOLN
INTRAVENOUS | Status: AC
  Administered 2020-10-29: 5 mg via INTRAVENOUS
  Filled 2020-10-29: qty 5

## 2020-10-29 MED ORDER — LORAZEPAM 2 MG/ML IJ SOLN
INTRAMUSCULAR | Status: AC
  Administered 2020-10-29: 1 mg via INTRAVENOUS
  Filled 2020-10-29: qty 1

## 2020-10-29 MED ORDER — ADENOSINE 6 MG/2ML IV SOLN: 12.0000 mg | Freq: Once | INTRAVENOUS | Status: AC

## 2020-10-29 MED ORDER — NOREPINEPHRINE 4 MG/250ML-% IV SOLN
INTRAVENOUS | Status: AC
  Filled 2020-10-29: qty 250

## 2020-10-29 MED ORDER — FERRIC CITRATE 1 GM 210 MG(FE) PO TABS
210.0000 mg | ORAL_TABLET | ORAL | Status: DC
  Administered 2020-10-30 – 2020-10-31 (×6): 210 mg via ORAL
  Filled 2020-10-29 (×16): qty 1

## 2020-10-29 MED ORDER — VANCOMYCIN HCL 1750 MG/350ML IV SOLN
1750.0000 mg | Freq: Once | INTRAVENOUS | Status: DC
  Filled 2020-10-29: qty 350

## 2020-10-29 MED ORDER — VANCOMYCIN VARIABLE DOSE PER UNSTABLE RENAL FUNCTION (PHARMACIST DOSING): Status: DC

## 2020-10-29 MED ORDER — FENTANYL CITRATE (PF) 100 MCG/2ML IJ SOLN
INTRAMUSCULAR | Status: AC
  Filled 2020-10-29: qty 2

## 2020-10-29 NOTE — Progress Notes (Signed)
PHARMACY - PHYSICIAN COMMUNICATION CRITICAL VALUE ALERT - BLOOD CULTURE IDENTIFICATION (BCID)  Travis [name] is an 59 y.o. male who presented to [facility] on 10/28/2020 with a chief complaint of STEMI.  Name of physician (or Provider) Contacted: CCM Patsey Berthold)  Current antibiotics: vancomycin + Zosyn  Changes to prescribed antibiotics recommended:  Recommendations accepted by provider  Results for orders placed or performed during the hospital encounter of 10/28/20  Blood Culture ID Panel (Reflexed) (Collected: 10/29/2020  8:30 AM)  Result Value Ref Range   Enterococcus faecalis NOT DETECTED NOT DETECTED   Enterococcus Faecium NOT DETECTED NOT DETECTED   Listeria monocytogenes NOT DETECTED NOT DETECTED   Staphylococcus species DETECTED (A) NOT DETECTED   Staphylococcus aureus (BCID) DETECTED (A) NOT DETECTED   Staphylococcus epidermidis NOT DETECTED NOT DETECTED   Staphylococcus lugdunensis NOT DETECTED NOT DETECTED   Streptococcus species NOT DETECTED NOT DETECTED   Streptococcus agalactiae NOT DETECTED NOT DETECTED   Streptococcus pneumoniae NOT DETECTED NOT DETECTED   Streptococcus pyogenes NOT DETECTED NOT DETECTED   A.calcoaceticus-baumannii NOT DETECTED NOT DETECTED   Bacteroides fragilis NOT DETECTED NOT DETECTED   Enterobacterales NOT DETECTED NOT DETECTED   Enterobacter cloacae complex NOT DETECTED NOT DETECTED   Escherichia coli NOT DETECTED NOT DETECTED   Klebsiella aerogenes NOT DETECTED NOT DETECTED   Klebsiella oxytoca NOT DETECTED NOT DETECTED   Klebsiella pneumoniae NOT DETECTED NOT DETECTED   Proteus species NOT DETECTED NOT DETECTED   Salmonella species NOT DETECTED NOT DETECTED   Serratia marcescens NOT DETECTED NOT DETECTED   Haemophilus influenzae NOT DETECTED NOT DETECTED   Neisseria meningitidis NOT DETECTED NOT DETECTED   Pseudomonas aeruginosa NOT DETECTED NOT DETECTED   Stenotrophomonas maltophilia NOT DETECTED NOT DETECTED   Candida albicans  NOT DETECTED NOT DETECTED   Candida auris NOT DETECTED NOT DETECTED   Candida glabrata NOT DETECTED NOT DETECTED   Candida krusei NOT DETECTED NOT DETECTED   Candida parapsilosis NOT DETECTED NOT DETECTED   Candida tropicalis NOT DETECTED NOT DETECTED   Cryptococcus neoformans/gattii NOT DETECTED NOT DETECTED   Meth resistant mecA/C and MREJ NOT DETECTED NOT DETECTED    Arrie Senate, PharmD, BCPS Clinical Pharmacist [phone] Please check AMION for all [name] numbers 10/29/2020

## 2020-10-29 NOTE — Progress Notes (Signed)
[facility] Progress Note  Subjective: seen in room, no c/o's, breathing much better, occ cough  Vitals:   10/29/20 1100 10/29/20 1144 10/29/20 1200 10/29/20 1300  BP: 131/85  138/85 (!) 105/53  Pulse:      Resp: 18  18 20   Temp:  99 F (37.2 C)    TempSrc:      SpO2:      Weight:      Height:        Exam:   alert, nad   no jvd  Chest cta bilat  Cor reg no RG  Abd soft ntnd no ascites   Ext no LE edema   Alert, NF, ox3    LUA AVF+bruit  Home meds:  - sensipar 120 qd/ ergocalciferol 50k q wk / auryxia 3 ac tid  - labetalol 300 bid  - niferex 150 bid  - prn's/ vitamins/ supplements    OP HD: AF MWF   4h  450/800  81.5kg  2/2.25 bath  RUE AVF  Hep 5000+ 1066midrun   - hect 5 ug tiw   -  mircera 50 ug q 4, last 11/15 (due 11/29)    Assessment/ Plan: 1. Acute STEMI - SP LAD PCI 11/20 am, has other disease in LCx/ RCA. Per cardiology.  2. SOB - had pulm edema by CXR w/ SOB / coughing. Better today, 4 L off Sat afternoon on HD. Looks sig better. Plan HD Monday, lower vol further as BP's will tolerate.  3. Fevers - unclear cause, temp was up to 102 1st night here, improving. Not on abx. Blood cx's neg at 12 hrs today.   4. ESRD - usual HD MWF.  Had LHC w/ high LVEDP +35 mmHg. Lowering vol w/ HD as tolerated.  Had HD yest off sched, plan HD tomorrow off schedule as well.  5. HTN/ vol overload - 4 L off yest, BP's much better in 120/80 range. Lower vol further w/ HD tomorrow as BP will tolerate.  6. 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 amLODipine  10 mg Oral Daily  . aspirin  81 mg Oral Daily  . atorvastatin  80 mg Oral q1800  . Chlorhexidine Gluconate Cloth  6 each  Topical Q0600  . [START ON 10/30/2020] doxercalciferol  5 mcg Intravenous Q M,W,F-HD  . ferric citrate  210 mg Oral With snacks  . ferric citrate  630 mg Oral TID with meals  . labetalol  300 mg Oral BID  . mouth rinse  15 mL Mouth Rinse BID  . mupirocin ointment  1 application Nasal BID  . sodium chloride flush  3 mL Intravenous Q12H  . ticagrelor  90 mg Oral BID   . sodium chloride Stopped (10/28/20 1953)  . sodium chloride Stopped (10/28/20 0954)  . heparin 1,000 Units/hr (10/29/20 0630)  . nitroGLYCERIN Stopped (10/28/20 2100)   sodium chloride, acetaminophen, morphine injection, nitroGLYCERIN, ondansetron (ZOFRAN) IV, oxyCODONE, sodium chloride flush

## 2020-10-29 NOTE — Procedures (Signed)
Central Venous Catheter Insertion Procedure Note  HENDRIX YURKOVICH  [id number]  Apr 23, 1961  Date:10/29/20  Time:10:26 PM   Provider Performing:Keiaira Donlan E Sabrinna Yearwood   Procedure: Insertion of Non-tunneled Central Venous [phone]) with US guidance ([id number])   Indication(s) Medication administration  Consent Risks of the procedure as well as the alternatives and risks of each were explained to the patient and/or caregiver.  Consent for the procedure was obtained and is signed in the bedside chart Risk of bleeding carefully reviewed given present medication therapies.   Anesthesia Topical only with 1% lidocaine   Timeout Verified patient identification, verified procedure, site/side was marked, verified correct patient position, special equipment/implants available, medications/allergies/relevant history reviewed, required imaging and test results available.  Sterile Technique Maximal sterile technique including full sterile barrier drape, hand hygiene, sterile gown, sterile gloves, mask, hair covering, sterile ultrasound probe cover (if used).  Procedure Description  Left Femoral vein selected as safest option as patient is awake, agitated, on heparin and brilinta and does not tolerate flat position well.  Area of catheter insertion was cleaned with chlorhexidine and draped in sterile fashion.  With real-time ultrasound guidance a central venous catheter was placed into the left femoral vein. Nonpulsatile blood flow and easy flushing noted in all ports.  The catheter was sutured in place and sterile dressing applied.  Complications/Tolerance None; patient tolerated the procedure well. Chest x-ray is not ordered for femoral cannulation.  EBL Minimal  Specimen(s) None  Eliseo Gum MSN, AGACNP-BC [place]  [id number] 10/29/2020, 10:27 PM

## 2020-10-29 NOTE — Consult Note (Signed)
NAME:  Travis [name], MRN:  [id number], DOB:  [birth year], LOS: 1 ADMISSION DATE:  10/28/2020, CONSULTATION DATE:  10/29/20 REFERRING MD:  Cardiology, Dr. Kimber [name] , CHIEF COMPLAINT:  Hypotension, gram positive cocci on culture, AMS  Brief History   Travis [name] is a 59 year old man with a history of HTN, ESRD on HD MWF, RUE AV fistula, hx of smoking, admitted 10/28/20 with fevers, myalgias for several days, acute chest pain.     History of present illness   On Admission, found to have STEMI, multivessel disease. PUlm edema with mild hypoxemia.  S/Post PCI to the LAD on 10/29/19. Planning for staged intervention to the left circumflex and possibly right coronary artery.  EF 18-56%, diastolic dysfunction, LVEDP 35. Underwent HD on 11/20 with 4 L volume removed.  Saturation improved after dialysis (100% on RA) Fevers noted 11/20. Blood cultures grew MSSA, noted this evening. .    This evening developed Afib with RVR 180s. Adenosine given, not helpful.  Amiodarone infusion started per cardiology, 150mg  bolus, then drip.  Lopressor 5mg IV and 500ccNS given. Started on Phenylephrine, was on 268mcg on my arrival.   Cardioverted emergently at 120J for ongoing afib with hypotension (60/40) and AMS.  Converted to sinus.  Remained moderately hypotensive (MAP 60) on phenylephrine.   Patient was given one dose zosyn, switched to ancef once cultures grew back MSSA.   Cardiac stress tests annually, last done 11/16/19.    Past Medical History  HTN ESRD, HD MWF  Significant Hospital Events   Cardioversion 11/21 Cardiac cath stent to LAD 11/20  Consults:  PCCM  Nephrology  Procedures:  Central line 11/21 (femoral, unable to lay flat, slightly agitated, also on heparin, brillinta, asa)  Arterial line 11/21   Significant Diagnostic Tests:    Micro Data:   GRAM POSITIVE COCCI IN CLUSTERS 11/21 MSSA per pharmacy Antimicrobials:  Zosyn 11/21 x 1 Ancef 11/21 -->  Interim history/subjective:    Objective   Blood pressure 113/81, pulse (!) 111, temperature 99.9 F (37.7 C), resp. rate 19, height 5\' 11"  (1.803 m), weight 82.8 kg, SpO2 97 %.        Intake/Output Summary (Last 24 hours) at 10/29/2020 2156 Last data filed at 10/29/2020 0700 Gross per 24 hour  Intake 150.02 ml  Output 50 ml  Net 100.02 ml   Filed Weights   10/28/20 0945 10/28/20 1400 10/29/20 1630  Weight: 86.4 kg 86.4 kg 82.8 kg    Examination: General: Awake alert, slightly anxious, pleasant HENT: NCAT Lungs: CTAB Cardiovascular: sinus tach (105) Abdomen: Nt, ND, NBS Extremities: no edema, no tenderness, no erythema, fistula not warm or tender, palpable thrill Neuro: anxious, alert, oriented    Resolved Hospital Problem list     Assessment & Plan:   Hypotension: suspect septic shock, MSSA.  Unclear source. No signs of PNA, no evidence of AV fistula infection. Received one dose of zosyn, will narrow now based on culture data with Cefazolin. Try transitioning to levophed; phenylephrine may be too much afterload.   Consider vasopressin adjuctively.   Hold antiHTN  Afib: likely in the setting of infection and active ischemic disease.  Cont amiodarone per cardiology.  Check electrolytes.   STEMI/CAD: continued management by cardiology. On Heparin, brillinta, asa.   Plan for continued intervetions per cards.    ESRD: does not appear to have acute need for HD right now.  Management per renal.     Best practice:  Diet: NPO Pain/Anxiety/Delirium protocol (if indicated):  VAP protocol (if indicated):  DVT prophylaxis: on heparin gtt GI prophylaxis: famotidine Glucose control: as needed  Mobility: bed  Code Status: Full  Family Communication: Spoke with Travis [name] and Travis [name] at bedside Disposition: ICU  Labs   CBC: Recent Labs  Lab 10/28/20 0604 10/29/20 0101 10/29/20 2128  WBC 12.7* 14.9*  --   NEUTROABS 11.4*  --   --   HGB 9.5* 9.9* 8.8*  HCT 29.5* 31.1* 26.0*  MCV 95.5 94.5  --    PLT 143* 121*  --     Basic Metabolic Panel: Recent Labs  Lab 10/28/20 0604 10/29/20 0101 10/29/20 2128  NA 133* 132* 126*  K 3.7 5.1 4.8  CL 86* 88*  --   CO2 31 28  --   GLUCOSE 140* 143*  --   BUN 30* 33*  --   CREATININE 9.73* 7.75*  --   CALCIUM 9.8 9.7  --    GFR: Estimated Creatinine Clearance: 11.1 mL/min (A) (by C-G formula based on SCr of 7.75 mg/dL (H)). Recent Labs  Lab 10/28/20 0604 10/29/20 0101  WBC 12.7* 14.9*    Liver Function Tests: Recent Labs  Lab 10/28/20 0604  AST 84*  ALT 87*  ALKPHOS 146*  BILITOT 1.2  PROT 6.6  ALBUMIN 3.1*   No results for input(s): LIPASE, AMYLASE in the last 168 hours. No results for input(s): AMMONIA in the last 168 hours.  ABG    Component Value Date/Time   PHART 7.495 (H) 10/29/2020 2128   PCO2ART 34.8 10/29/2020 2128   PO2ART 68 (L) 10/29/2020 2128   HCO3 26.6 10/29/2020 2128   TCO2 28 10/29/2020 2128   O2SAT 94.0 10/29/2020 2128     Coagulation Profile: Recent Labs  Lab 10/28/20 0629  INR 1.3*    Cardiac Enzymes: No results for input(s): CKTOTAL, CKMB, CKMBINDEX, TROPONINI in the last 168 hours.  HbA1C: Hgb A1c MFr Bld  Date/Time Value Ref Range Status  10/28/2020 06:04 AM 4.3 (L) 4.8 - 5.6 % Final    Comment:    (NOTE) Pre diabetes:          5.7%-6.4%  Diabetes:              >6.4%  Glycemic control for   <7.0% adults with diabetes   01/30/2015 09:09 PM 5.1 4.8 - 5.6 % Final    Comment:    (NOTE)         Pre-diabetes: 5.7 - 6.4         Diabetes: >6.4         Glycemic control for adults with diabetes: <7.0     CBG: Recent Labs  Lab 10/28/20 1120  GLUCAP 111*    Review of Systems:   Unable to assess in acute emergent setting   Past Medical History  He,  has a past medical history of Acute ST elevation myocardial infarction (STEMI) of anterolateral wall ([place]) (10/28/2020), Anemia of chronic disease (01/30/2015), Coronary artery disease involving native coronary artery of  native heart with unstable angina pectoris ([place]) (10/28/2020), End-stage renal disease on hemodialysis ([place]), GERD (gastroesophageal reflux disease), Hyperlipidemia with target LDL less than 70 (10/28/2020), Hypertension, Pneumonia, Presence of drug coated stent in LAD coronary artery (10/28/2020), and Sleep apnea.   Surgical History    Past Surgical History:  Procedure Laterality Date  . AV FISTULA PLACEMENT Left 12/22/2017   Procedure: REPAIR PSEUDOANEURYSM ARTERIOVENOUS (AV) FISTULA;  Surgeon: [name], MD;  Location: [facility];  Service: Vascular;  Laterality: Left;  . BASCILIC VEIN TRANSPOSITION Left 01/05/2014   Procedure: LEFT 1ST STAGE BASCILIC VEIN TRANSPOSITION;  Surgeon: Conrad Red Jacket, MD;  Location: [facility];  Service: Vascular;  Laterality: Left;  . BASCILIC VEIN TRANSPOSITION Left 07/26/2014   Procedure: Left Arm Brachial Vein Transposition Second Stage;  Surgeon: Conrad [place], MD;  Location: [facility];  Service: Vascular;  Laterality: Left;  . COLONOSCOPY    . COLONOSCOPY N/A 02/02/2015   Procedure: COLONOSCOPY;  Surgeon: [name], MD;  Location: [facility] ENDOSCOPY;  Service: Endoscopy;  Laterality: N/A;  . ESOPHAGOGASTRODUODENOSCOPY Left 01/31/2015   Procedure: ESOPHAGOGASTRODUODENOSCOPY (EGD);  Surgeon: [name], MD;  [facility];  Service: Endoscopy;  Laterality: Left;  . EVALUATION UNDER ANESTHESIA WITH HEMORRHOIDECTOMY N/A 02/26/2018   Procedure: ANORECTAL EXAM UNDER ANESTHESIA  HEMORRHOIDECTOMY x 2  HEMORRHOIDAL LIGATION AND PEXY;  Surgeon: Keshon Boston, MD;  Location: [facility];  Service: General;  Laterality: N/A;  . GIVENS CAPSULE STUDY N/A 01/31/2015   Procedure: GIVENS CAPSULE STUDY;  Surgeon: [name], MD;  Location: [facility] ENDOSCOPY;  Service: Endoscopy;  Laterality: N/A;  . MOUTH SURGERY     teeth cleaning  . NEPHRECTOMY RADICAL Right 05/2015   [facility] Dr [name] for R renal mass     Social History   reports that he quit smoking about 7 years ago. His smoking  use included cigars. He quit after 4.00 years of use. He has never used smokeless tobacco. He reports that he does not drink alcohol and does not use drugs.   Family History   His family history includes Hypertension in his father, mother, and sister.   Allergies Allergies  Allergen Reactions  . Ibuprofen Hives  . Lisinopril Swelling    PT states he is allergic to all prils; caused facial swelling  . Naproxen Hives and Other (See Comments)    Alleve causes patient to have hives     Home Medications  Prior to Admission medications   Medication Sig Start Date End Date Taking? Authorizing Provider  acetaminophen (TYLENOL) 325 MG tablet Take 650 mg by mouth every 6 (six) hours as needed for mild pain, fever or headache.   Yes [provider]  AURYXIA 1 GM 210 MG(Fe) tablet Take 210-630 mg by mouth See admin instructions. 630mg  three times daily with meals and 210-420mg  with snacks 10/03/17  Yes [provider]  cinacalcet (SENSIPAR) 60 MG tablet Take 120 mg by mouth daily. 10/14/20  Yes [provider]  multivitamin (RENA-VIT) TABS tablet Take 1 tablet by mouth daily. 10/13/20  Yes [provider]  Vitamin D, Ergocalciferol, (DRISDOL) 1.25 MG (50000 UNIT) CAPS capsule Take 50,000 Units by mouth once a week. 10/17/20  Yes [provider]  iron polysaccharides (NIFEREX) 150 MG capsule Take 1 capsule (150 mg total) by mouth 2 (two) times daily. Patient not taking: Reported on 10/28/2020 02/02/15   [name], MD  labetalol (NORMODYNE) 300 MG tablet Take 1 tablet (300 mg total) by mouth 2 (two) times daily. Patient not taking: Reported on 10/28/2020 02/02/15   [name], MD     Critical care time: 45 min.

## 2020-10-29 NOTE — Progress Notes (Signed)
Progress Note  Patient Name: Travis [name] Date of Encounter: 10/29/2020  Primary Cardiologist: No primary care provider on file.   Subjective   No acute events overnight. Yesterday after the heart catheterization did have an episode of flash pulmonary edema requiring urgent dialysis. Has improved after dialysis session. Heart rates slightly elevated this morning the low 100s. No chest discomfort. Continues to have hiccups which have been going on for several days.  Inpatient Medications    Scheduled Meds: . amLODipine  10 mg Oral Daily  . aspirin  81 mg Oral Daily  . atorvastatin  80 mg Oral q1800  . Chlorhexidine Gluconate Cloth  6 each Topical Q0600  . [START ON 10/30/2020] doxercalciferol  5 mcg Intravenous Q M,W,F-HD  . labetalol  300 mg Oral BID  . mouth rinse  15 mL Mouth Rinse BID  . mupirocin ointment  1 application Nasal BID  . sodium chloride flush  3 mL Intravenous Q12H  . ticagrelor  90 mg Oral BID   Continuous Infusions: . sodium chloride Stopped (10/28/20 1953)  . sodium chloride Stopped (10/28/20 0954)  . heparin 1,000 Units/hr (10/29/20 0630)  . nitroGLYCERIN Stopped (10/28/20 2100)   PRN Meds: sodium chloride, acetaminophen, morphine injection, nitroGLYCERIN, ondansetron (ZOFRAN) IV, oxyCODONE, sodium chloride flush   Vital Signs    Vitals:   10/29/20 0630 10/29/20 0700 10/29/20 0800 10/29/20 0808  BP:  120/75 119/87   Pulse: (!) 112 (!) 112 98   Resp: (!) 21 19 19    Temp:    (!) 100.6 F (38.1 C)  TempSrc:      SpO2: 94% 92% 92%   Weight:      Height:        Intake/Output Summary (Last 24 hours) at 10/29/2020 1001 Last data filed at 10/29/2020 0800 Gross per 24 hour  Intake 360 ml  Output 4100 ml  Net -3740 ml   Filed Weights   10/28/20 0557 10/28/20 0945 10/28/20 1400  Weight: 86.2 kg 86.4 kg 86.4 kg    Telemetry    Sinus tachycardia - Personally Reviewed  ECG    No new- Personally Reviewed  Physical Exam   GEN: No  acute distress.   Neck: No JVD Cardiac:  Regular rhythm, tachycardic, no murmurs, rubs, or gallops. AV fistula with good bruit and thrill. Respiratory: Clear to auscultation bilaterally. GI: Soft, nontender, non-distended  MS: No edema; No deformity. Neuro:  Nonfocal  Psych: Normal affect   Labs    Chemistry Recent Labs  Lab 10/28/20 0604 10/29/20 0101  NA 133* 132*  K 3.7 5.1  CL 86* 88*  CO2 31 28  GLUCOSE 140* 143*  BUN 30* 33*  CREATININE 9.73* 7.75*  CALCIUM 9.8 9.7  PROT 6.6  --   ALBUMIN 3.1*  --   AST 84*  --   ALT 87*  --   ALKPHOS 146*  --   BILITOT 1.2  --   GFRNONAA 6* 7*  ANIONGAP 16* 16*     Hematology Recent Labs  Lab 10/28/20 0604 10/29/20 0101  WBC 12.7* 14.9*  RBC 3.09* 3.29*  HGB 9.5* 9.9*  HCT 29.5* 31.1*  MCV 95.5 94.5  MCH 30.7 30.1  MCHC 32.2 31.8  RDW 13.9 14.0  PLT 143* 121*    Cardiac EnzymesNo results for input(s): TROPONINI in the last 168 hours. No results for input(s): TROPIPOC in the last 168 hours.   BNPNo results for input(s): BNP, PROBNP in the last 168 hours.  DDimer No results for input(s): DDIMER in the last 168 hours.   Radiology    CARDIAC CATHETERIZATION  Result Date: 10/28/2020  There is mild to moderate left ventricular systolic dysfunction. The left ventricular ejection fraction is 40-45% by visual estimate - > anterior-anterolateral apical hypokinesis  LV end diastolic pressure is severely elevated -> on recheck post PCI, EDP 30 mmHg  Mid LAD lesion is 99% stenosed. 1st Diag lesion is 55% stenosed just proximal to the lesion  A drug-eluting stent was successfully placed crossing the diagonal branch, using a [place] [id number]. Postdilated to 3.1 mm  Post intervention, there is a 0% residual stenosis.  LPAV lesion is 99% stenosed -> ostial lesion (LCx is 90  turn followed by another 90  turn into the AVG)  Prox RCA lesion is 80% stenosed -> concentric napkin ring calcified lesion.  SUMMARY   Acute anterolateral ST elevation MI with Culprit lesion being the 99% mid LAD lesion (just past major 1st Diag/D1) along with multivessel CAD including 99% ostial AV groove LCx and 80% calcified napkin ring proximal RCA lesion with extensive calcification throughout the RCA.  Successful PTCA and DES PCI of the LAD crossing D1 -resolute Onyx DES 2.75 mm x 18 mm postdilated to 3.1 mm.  With PCI, there was stabilization of significant ectopy, AIVR and PVCs  Mild to moderate reduced EF with anterior anterolateral hypokinesis, EF roughly 40 to 45%.  Initial evaluation suggested normal EDP, but on recheck post PCI LVEDP of 30 mmHg, severely elevated consistent with ACUTE DIASTOLIC HEART FAILURE  Significantly dilated aortic root requiring AL-1 guide catheter for both Left and Right Coronary Angiography. RECOMMENDATIONS  Admit to CCU, restart IV heparin 8 hours after sheath removal  Check 2D echo for better assessment of EF  Plan to review cath films with other IC cardiologist, anticipate staged PCI of at least the RCA which may require atherectomy versus lithotripsy, and likely the ostial AV groove circumflex.  If he has signs of dyspnea, consider earlier dialysis than tomorrow. ->  Would consult for nephrology to see today.  He is already on labetalol and Norvasc.  We will continue current home medications.  Uninterrupted DAPT times X 1 year. Glenetta Hew, M.D., M.S. Interventional Cardiologist Pager # [phone] Phone # [phone] [address] [place], [place] [address]  DG CHEST PORT 1 VIEW  Result Date: 10/28/2020 CLINICAL DATA:  Acute MI.  Shortness of breath. EXAM: PORTABLE CHEST 1 VIEW COMPARISON:  January 30, 2015 FINDINGS: The cardiomediastinal silhouette is stable. Mild increased interstitial opacities seen in the perihilar regions, right greater than left. No pneumothorax. No other acute abnormalities. IMPRESSION: Mild perihilar opacities are suggestive of mild edema given the  history provided of an acute myocardial infarction. Recommend attention on follow-up. Electronically Signed   By: Dorise Bullion III M.D   On: 10/28/2020 13:40    Cardiac Studies   No new, echo pending  Patient Profile     59 y.o. male with end-stage renal disease on IHD, dilated a sending aorta who had an anterior STEMI yesterday and underwent PCI of the LAD. He has residual disease in his right coronary and left circumflex.  Assessment & Plan     1. Anterolateral STEMI Post PCI to the LAD. Planning for staged intervention to the left circumflex and possibly right coronary artery. Hemodynamically stable albeit with sinus tachycardia. LV function on ventriculogram reduced with anterior hypokinesis Echo pending Continue aspirin, ticagrelor, atorvastatin Keep n.p.o. Sunday night for  possible staged intervention on Monday   2. End-stage renal disease on IHD Monday Wednesday Friday Nephrology following    For questions or updates, please contact [place] Please consult [email or web address] for contact info under Cardiology/STEMI.       Signed, Lars Mage, MD  10/29/2020, 10:01 AM

## 2020-10-29 NOTE — Progress Notes (Signed)
Pharmacy Antibiotic Note  Travis [name] is a 60 y.o. male admitted on 10/28/2020 with NSTEMI. Pt now decompensated and septic with blood cultures 2/2 with GPC in clusters (ID pending). Pharmacy has been consulted for vancomycin and Zosyn dosing for broad coverage for now. Pt has ESRD on HD, last iHD 11/20.  Plan: Vancomycin 1750mg  IV x1 Zosyn 2.25g IV q8h Follow cultures, HD plans  Height: 5\' 11"  (180.3 cm) Weight: 82.8 kg (182 lb 8.7 oz) IBW/kg (Calculated) : 75.3  Temp (24hrs), Avg:99.6 F (37.6 C), Min:98.7 F (37.1 C), Max:100.6 F (38.1 C)  Recent Labs  Lab 10/28/20 0604 10/29/20 0101  WBC 12.7* 14.9*  CREATININE 9.73* 7.75*    Estimated Creatinine Clearance: 11.1 mL/min (A) (by C-G formula based on SCr of 7.75 mg/dL (H)).    Allergies  Allergen Reactions  . Ibuprofen Hives  . Lisinopril Swelling    PT states he is allergic to all prils; caused facial swelling  . Naproxen Hives and Other (See Comments)    Alleve causes patient to have hives    Antimicrobials this admission: Vancomycin 11/21 >> Zosyn 11/21 >>   Microbiology results: 11/21 BCx: GPC   Arrie Senate, PharmD, BCPS Clinical Pharmacist [phone] Please check AMION for all [place] numbers 10/29/2020

## 2020-10-29 NOTE — Progress Notes (Addendum)
Brief Note  I was called to bedside due to elevated HR and dizziness upon position change. EKG revealed A.fib w/ RVR with ventricular response 180-200. Patient denied any chest pain and BP was 90-176mmHg. Patient was given 6mg  Adenosine, and repeat 2x boluses of 12mg  Adenosine without ventricular response only slowing momentarily. He was initiated on Amiodarone after 150mg  bolus, lopressor 5mg x1 given. Plan to initiate Amiodarone gtt. Patient's HR improved to 140bpm with interventions. He was also started on fluids for 250cc bolus.   30 minutes later patient's BP continued to be low with MAPs dropping into high 40s, patient had cold extremities to touch, and his mental status began to deteriorate. HR 160-170s in A.fib w/ RVR. BC obtained earlier today revealed Gram + cocci in clusters in 2 /2 cultures. Patient was given 1mg  morphine and with ICU support (Dr. Patsey Berthold) patient was synchronized cardioverted w/ 120J to NSR. EKG pending. Patient had improvement in his mental status post DCCV with HR ranging 80-100mg .   Bedside, EF <35% on bedside echo. Patient was tachycardic into 180-200bpm at time of the bedside echo which can cause EF to appear lower than usual. LAD territory severe hypokinesis.   Plan:  A.fib w/ RVR (provoked) due to bactremia - s/p DCCVx1 to NSR.  - continue heparin. - Patient will need Eliquis or Coumadin on DC  - If he were to receive further stenting this hospitalization he will likely be on TOAT with discontinuation of ASA after 1 month to reduce bleeding risk.  - continue Amiodarone, obtain TSH  Septic shock - currently on Levo, wean as tolerated - CCU and ID consultation - currently on cefazolin  MSSA bactremia - ID onborad  MVD - Initial plans for repeat heart catheterization tomorrow. This will need postponement.  - He will need vascular US of AVF and Echo for assessment of EF +/- TEE  Signed: Julliana Whitmyer 10/29/2020, 8:43 PM

## 2020-10-29 NOTE — Plan of Care (Signed)

## 2020-10-29 NOTE — Procedures (Signed)
Arterial Catheter Insertion Procedure Note  Travis [name]  [id number]  May 23, 1961  Date:10/29/20  Time:9:53 PM    Provider Performing: Cordella Register    Procedure: Insertion of Arterial Line [phone]) without US guidance  Indication(s) Blood pressure monitoring and/or need for frequent ABGs  Consent Risks of the procedure as well as the alternatives and risks of each were explained to the patient and/or caregiver.  Consent for the procedure was obtained and is signed in the bedside chart  Anesthesia None   Time Out Verified patient identification, verified procedure, site/side was marked, verified correct patient position, special equipment/implants available, medications/allergies/relevant history reviewed, required imaging and test results available.   Sterile Technique Maximal sterile technique including full sterile barrier drape, hand hygiene, sterile gown, sterile gloves, mask, hair covering, sterile ultrasound probe cover (if used).   Procedure Description Area of catheter insertion was cleaned with chlorhexidine and draped in sterile fashion. Without real-time ultrasound guidance an arterial catheter was placed into the right radial artery.  Appropriate arterial tracings confirmed on monitor.     Complications/Tolerance None; patient tolerated the procedure well.   EBL    Specimen(s) None

## 2020-10-29 NOTE — Care Management (Signed)
1216 10-29-20 Benefits check submitted for Brilinta. Case Manager will follow for cost. Graves-Bigelow, Ocie Cornfield, RN,BSN Case Manager

## 2020-10-29 NOTE — Progress Notes (Signed)
[place] for heparin Indication: chest pain/ACS  Allergies  Allergen Reactions  . Ibuprofen Hives  . Lisinopril Swelling    PT states he is allergic to all prils; caused facial swelling  . Naproxen Hives and Other (See Comments)    Alleve causes patient to have hives    Patient Measurements: Height: 5\' 11"  (180.3 cm) Weight: 82.8 kg (182 lb 8.7 oz) IBW/kg (Calculated) : 75.3 Heparin Dosing Weight: 86.4 kg   Vital Signs: Temp: 99.9 F (37.7 C) (11/21 1531) BP: 105/53 (11/21 1300) Pulse Rate: 111 (11/21 0930)  Labs: Recent Labs    10/28/20 0604 10/28/20 0629 10/28/20 1046 10/28/20 2040 10/29/20 0101 10/29/20 1432  HGB 9.5*  --   --   --  9.9*  --   HCT 29.5*  --   --   --  31.1*  --   PLT 143*  --   --   --  121*  --   APTT  --  36  --   --   --   --   LABPROT  --  15.3*  --   --   --   --   INR  --  1.3*  --   --   --   --   HEPARINUNFRC  --   --   --   --   --  <0.10*  CREATININE 9.73*  --   --   --  7.75*  --   TROPONINIHS  --  3,155* >27,000* >27,000*  --   --     Estimated Creatinine Clearance: 11.1 mL/min (A) (by C-G formula based on SCr of 7.75 mg/dL (H)).   Medical History: Past Medical History:  Diagnosis Date  . Acute ST elevation myocardial infarction (STEMI) of anterolateral wall ([place]) 10/28/2020   Culprit lesion, 99% ulcerated mid LAD at D1 -> DES PCI  . Anemia of chronic disease 01/30/2015  . Coronary artery disease involving native coronary artery of native heart with unstable angina pectoris ([place]) 10/28/2020   Acute anterolateral STEMI: 3V CAD: Culprit Lesion = 99% mid LAD lesion (just after D1) - DES PCI; 99% ostial AV groove LCx; & 80% calcified napkin ring proximal RCA lesion with extensive calcification throughout the RCA. Successful PTCA and DES PCI of the LAD crossing D1 -resolute Onyx DES 2.75 mm x 18 mm postdilated to 3.1 mm. With PCI, there  . End-stage renal disease on hemodialysis ([place])   . GERD  (gastroesophageal reflux disease)    takes Nexium  . Hyperlipidemia with target LDL less than 70 10/28/2020  . Hypertension   . Pneumonia    as a child  . Presence of drug coated stent in LAD coronary artery 10/28/2020   Proximal-mid LAD 99% ->0% -> Resolute Onyx DES 2.75 mm x 18 mm (3.1 mm)  . Sleep apnea    uses c-pap 2 yrs    Medications:  Scheduled:  . amLODipine  10 mg Oral Daily  . aspirin  81 mg Oral Daily  . atorvastatin  80 mg Oral q1800  . Chlorhexidine Gluconate Cloth  6 each Topical Q0600  . [START ON 10/30/2020] doxercalciferol  5 mcg Intravenous Q M,W,F-HD  . ferric citrate  210 mg Oral With snacks  . ferric citrate  630 mg Oral TID with meals  . labetalol  300 mg Oral BID  . mouth rinse  15 mL Mouth Rinse BID  . mupirocin ointment  1 application Nasal BID  .  sodium chloride flush  3 mL Intravenous Q12H  . ticagrelor  90 mg Oral BID    Assessment: 33 yom presenting with STEMI. Known to have dilated ascending aorta, ESRD. Found to have 99% mid LAD with multivessel in LCx and RCA, now s/p DES to LAD. Plan for heparin restart 8 hr after sheath removal possible staged PCI of RCA and LCx.   Heparin resumed, initial level undetectable, no infusion issues per nursing.  Goal of Therapy:  Heparin level 0.3-0.7 units/ml Monitor platelets by anticoagulation protocol: Yes   Plan:  Increase heparin to 1250 units/h Recheck heparin level in 6h    Arrie Senate, PharmD, BCPS Clinical Pharmacist [phone] Please check AMION for all [place] numbers 10/29/2020

## 2020-10-30 ENCOUNTER — Inpatient Hospital Stay (HOSPITAL_COMMUNITY): Payer: Medicare Other

## 2020-10-30 ENCOUNTER — Encounter (HOSPITAL_COMMUNITY): Payer: Self-pay | Admitting: Cardiology

## 2020-10-30 DIAGNOSIS — I2109 ST elevation (STEMI) myocardial infarction involving other coronary artery of anterior wall: Secondary | ICD-10-CM | POA: Diagnosis not present

## 2020-10-30 DIAGNOSIS — I132 Hypertensive heart and chronic kidney disease with heart failure and with stage 5 chronic kidney disease, or end stage renal disease: Secondary | ICD-10-CM

## 2020-10-30 DIAGNOSIS — I2102 ST elevation (STEMI) myocardial infarction involving left anterior descending coronary artery: Secondary | ICD-10-CM | POA: Diagnosis not present

## 2020-10-30 DIAGNOSIS — T8144XA Sepsis following a procedure, initial encounter: Secondary | ICD-10-CM | POA: Diagnosis not present

## 2020-10-30 DIAGNOSIS — I5043 Acute on chronic combined systolic (congestive) and diastolic (congestive) heart failure: Secondary | ICD-10-CM

## 2020-10-30 DIAGNOSIS — Z95810 Presence of automatic (implantable) cardiac defibrillator: Secondary | ICD-10-CM

## 2020-10-30 DIAGNOSIS — N186 End stage renal disease: Secondary | ICD-10-CM | POA: Diagnosis not present

## 2020-10-30 DIAGNOSIS — R6521 Severe sepsis with septic shock: Secondary | ICD-10-CM | POA: Diagnosis not present

## 2020-10-30 DIAGNOSIS — I482 Chronic atrial fibrillation, unspecified: Secondary | ICD-10-CM

## 2020-10-30 DIAGNOSIS — B9561 Methicillin susceptible Staphylococcus aureus infection as the cause of diseases classified elsewhere: Secondary | ICD-10-CM

## 2020-10-30 DIAGNOSIS — I5031 Acute diastolic (congestive) heart failure: Secondary | ICD-10-CM

## 2020-10-30 DIAGNOSIS — A4101 Sepsis due to Methicillin susceptible Staphylococcus aureus: Secondary | ICD-10-CM | POA: Diagnosis not present

## 2020-10-30 DIAGNOSIS — R7881 Bacteremia: Secondary | ICD-10-CM

## 2020-10-30 DIAGNOSIS — I2510 Atherosclerotic heart disease of native coronary artery without angina pectoris: Secondary | ICD-10-CM | POA: Diagnosis not present

## 2020-10-30 DIAGNOSIS — I4891 Unspecified atrial fibrillation: Secondary | ICD-10-CM

## 2020-10-30 DIAGNOSIS — Z992 Dependence on renal dialysis: Secondary | ICD-10-CM

## 2020-10-30 LAB — COMPREHENSIVE METABOLIC PANEL
ALT: 51 U/L — ABNORMAL HIGH (ref 0–44)
AST: 87 U/L — ABNORMAL HIGH (ref 15–41)
Albumin: 2.2 g/dL — ABNORMAL LOW (ref 3.5–5.0)
Alkaline Phosphatase: 95 U/L (ref 38–126)
Anion gap: 17 — ABNORMAL HIGH (ref 5–15)
BUN: 75 mg/dL — ABNORMAL HIGH (ref 6–20)
CO2: 22 mmol/L (ref 22–32)
Calcium: 9.1 mg/dL (ref 8.9–10.3)
Chloride: 86 mmol/L — ABNORMAL LOW (ref 98–111)
Creatinine, Ser: 11.18 mg/dL — ABNORMAL HIGH (ref 0.61–1.24)
GFR, Estimated: 5 mL/min — ABNORMAL LOW (ref >60)
Glucose, Bld: 148 mg/dL — ABNORMAL HIGH (ref 70–99)
Potassium: 5.2 mmol/L — ABNORMAL HIGH (ref 3.5–5.1)
Sodium: 125 mmol/L — ABNORMAL LOW (ref 135–145)
Total Bilirubin: 1.5 mg/dL — ABNORMAL HIGH (ref 0.3–1.2)
Total Protein: 5.6 g/dL — ABNORMAL LOW (ref 6.5–8.1)

## 2020-10-30 LAB — HEPARIN LEVEL (UNFRACTIONATED)
Heparin Unfractionated: 0.1 IU/mL — ABNORMAL LOW (ref 0.30–0.70)
Heparin Unfractionated: 0.15 IU/mL — ABNORMAL LOW (ref 0.30–0.70)

## 2020-10-30 LAB — CBC
HCT: 24.6 % — ABNORMAL LOW (ref 39.0–52.0)
HCT: 24.5 % — ABNORMAL LOW (ref 39.0–52.0)
Hemoglobin: 8.1 g/dL — ABNORMAL LOW (ref 13.0–17.0)
Hemoglobin: 8 g/dL — ABNORMAL LOW (ref 13.0–17.0)
MCH: 30.6 pg (ref 26.0–34.0)
MCH: 30.3 pg (ref 26.0–34.0)
MCHC: 32.9 g/dL (ref 30.0–36.0)
MCHC: 32.7 g/dL (ref 30.0–36.0)
MCV: 92.8 fL (ref 80.0–100.0)
MCV: 92.8 fL (ref 80.0–100.0)
Platelets: 126 10*3/uL — ABNORMAL LOW (ref 150–400)
Platelets: 119 10*3/uL — ABNORMAL LOW (ref 150–400)
RBC: 2.65 MIL/uL — ABNORMAL LOW (ref 4.22–5.81)
RBC: 2.64 MIL/uL — ABNORMAL LOW (ref 4.22–5.81)
RDW: 14.6 % (ref 11.5–15.5)
RDW: 14.4 % (ref 11.5–15.5)
WBC: 20.4 10*3/uL — ABNORMAL HIGH (ref 4.0–10.5)
WBC: 20.9 10*3/uL — ABNORMAL HIGH (ref 4.0–10.5)
nRBC: 0 % (ref 0.0–0.2)
nRBC: 0 % (ref 0.0–0.2)

## 2020-10-30 LAB — POCT ACTIVATED CLOTTING TIME
Activated Clotting Time: 296 seconds
Activated Clotting Time: 257 seconds

## 2020-10-30 LAB — POCT I-STAT 7, (LYTES, BLD GAS, ICA,H+H)
Acid-Base Excess: 3 mmol/L — ABNORMAL HIGH (ref 0.0–2.0)
Bicarbonate: 26.5 mmol/L (ref 20.0–28.0)
Calcium, Ion: 1.22 mmol/L (ref 1.15–1.40)
HCT: 24 % — ABNORMAL LOW (ref 39.0–52.0)
Hemoglobin: 8.2 g/dL — ABNORMAL LOW (ref 13.0–17.0)
O2 Saturation: 90 %
Patient temperature: 99.8
Potassium: 4.9 mmol/L (ref 3.5–5.1)
Sodium: 125 mmol/L — ABNORMAL LOW (ref 135–145)
TCO2: 28 mmol/L (ref 22–32)
pCO2 arterial: 38.2 mmHg (ref 32.0–48.0)
pH, Arterial: 7.452 — ABNORMAL HIGH (ref 7.350–7.450)
pO2, Arterial: 58 mmHg — ABNORMAL LOW (ref 83.0–108.0)

## 2020-10-30 LAB — TROPONIN I (HIGH SENSITIVITY): Troponin I (High Sensitivity): >27000 ng/L (ref <18)

## 2020-10-30 LAB — PROTIME-INR
INR: 1.2 (ref 0.8–1.2)
Prothrombin Time: 15 seconds (ref 11.4–15.2)

## 2020-10-30 LAB — LACTIC ACID, PLASMA
Lactic Acid, Venous: 2 mmol/L (ref 0.5–1.9)
Lactic Acid, Venous: 2.1 mmol/L (ref 0.5–1.9)

## 2020-10-30 MED ORDER — DOXERCALCIFEROL 4 MCG/2ML IV SOLN
INTRAVENOUS | Status: AC
  Administered 2020-10-30: 5 ug via INTRAVENOUS
  Filled 2020-10-30: qty 4

## 2020-10-30 MED ORDER — SODIUM CHLORIDE 0.9% FLUSH
10.0000 mL | Freq: Two times a day (BID) | INTRAVENOUS | Status: DC
  Administered 2020-10-30 – 2020-11-17 (×30): 10 mL
  Administered 2020-11-18: 10:00:00 30 mL
  Administered 2020-11-18: 23:00:00 10 mL
  Administered 2020-11-19: 10:00:00 30 mL
  Administered 2020-11-20: 11:00:00 10 mL
  Administered 2020-11-20: 22:00:00 20 mL
  Administered 2020-11-20: 30 mL
  Administered 2020-11-21 – 2020-11-27 (×11): 10 mL
  Administered 2020-11-29: 21:00:00 40 mL
  Administered 2020-11-30 – 2020-12-04 (×8): 10 mL

## 2020-10-30 MED ORDER — SODIUM CHLORIDE 0.9% FLUSH: 10.0000 mL | INTRAVENOUS | Status: DC | PRN

## 2020-10-30 MED ORDER — SODIUM ZIRCONIUM CYCLOSILICATE 10 G PO PACK
10.0000 g | PACK | Freq: Once | ORAL | Status: AC
  Administered 2020-10-30: 10 g via ORAL
  Filled 2020-10-30: qty 1

## 2020-10-30 MED ORDER — VASOPRESSIN 20 UNITS/100 ML INFUSION FOR SHOCK
0.0000 [IU]/min | INTRAVENOUS | Status: DC
  Administered 2020-10-30 – 2020-11-03 (×9): 0.03 [IU]/min via INTRAVENOUS
  Administered 2020-11-04: 0.02 [IU]/min via INTRAVENOUS
  Administered 2020-11-04 – 2020-11-08 (×7): 0.03 [IU]/min via INTRAVENOUS
  Filled 2020-10-30 (×24): qty 100

## 2020-10-30 MED ORDER — AMIODARONE LOAD VIA INFUSION
150.0000 mg | Freq: Once | INTRAVENOUS | Status: AC
  Administered 2020-10-30: 150 mg via INTRAVENOUS
  Filled 2020-10-30: qty 83.34

## 2020-10-30 MED FILL — Heparin Sod (Porcine)-NaCl IV Soln 1000 Unit/500ML-0.9%: INTRAVENOUS | Qty: 1000 | Status: AC

## 2020-10-30 NOTE — Progress Notes (Signed)
  Echocardiogram 2D Echocardiogram has been attempted. Patient HR 125-145. Will reattempt at later time.  Randa Lynn Surina Storts 10/30/2020, 9:45 AM

## 2020-10-30 NOTE — Progress Notes (Signed)
NAME:  Travis [name], MRN:  [id number], DOB:  June 28, 1961, LOS: 2 ADMISSION DATE:  10/28/2020, CONSULTATION DATE:  10/29/20 REFERRING MD:  Cardiology, Dr. Kimber Relic , CHIEF COMPLAINT:  Hypotension, gram positive cocci on culture, AMS  Brief History   Travis [name] is a 59 year old man with a history of HTN, ESRD on HD MWF, RUE AV fistula, hx of smoking, admitted 10/28/20 with fevers, myalgias for several days, acute chest pain.     History of present illness   On Admission, found to have STEMI, multivessel disease. Pulm edema with mild hypoxemia.  S/Post PCI to the LAD on 10/29/19. Planning for staged intervention to the left circumflex and possibly right coronary artery.  EF 27-03%, diastolic dysfunction, LVEDP 35. Underwent HD on 11/20 with 4 L volume removed.  Saturation improved after dialysis (100% on RA) Fevers noted 11/20. Blood cultures grew MSSA, noted this evening. .    This evening developed Afib with RVR 180s. Adenosine given, not helpful.  Amiodarone infusion started per cardiology, 150mg  bolus, then drip.  Lopressor 5mg IV and 500ccNS given. Started on Phenylephrine, was on 226mcg on my arrival.   Cardioverted emergently at 120J for ongoing afib with hypotension (60/40) and AMS.  Converted to sinus.  Remained moderately hypotensive (MAP 60) on phenylephrine.   Patient was given one dose zosyn, switched to ancef once cultures grew back MSSA.   Cardiac stress tests annually, last done 11/16/19.    Past Medical History  HTN ESRD, HD MWF  Significant Hospital Events   Cardioversion 11/21 Cardiac cath stent to LAD 11/20  Consults:  PCCM  Nephrology  Procedures:  Central line 11/21 (femoral, unable to lay flat, slightly agitated, also on heparin, brillinta, asa)  Arterial line 11/21   Significant Diagnostic Tests:    Micro Data:   11/21 Staph aureus > MSSA by BCID >>>  Antimicrobials:  Zosyn 11/21 x 1 Ancef 11/21 ->  Interim history/subjective:  Feeling better  today. Can't get comfortable  Objective   Blood pressure 90/67, pulse 98, temperature 98.9 F (37.2 C), resp. rate (!) 42, height 5\' 11"  (1.803 m), weight 82.8 kg, SpO2 98 %.        Intake/Output Summary (Last 24 hours) at 10/30/2020 1104 Last data filed at 10/30/2020 1000 Gross per 24 hour  Intake 1657.82 ml  Output --  Net 1657.82 ml   Filed Weights   10/28/20 0945 10/28/20 1400 10/29/20 1630  Weight: 86.4 kg 86.4 kg 82.8 kg    Examination:  General: middle aged adult male in NAD HENT: [place]/AT, PERRL Lungs: Clear bilateral breath sounds Cardiovascular: IRIR, tachycardic 100-120s. Abdomen: Nt, ND, NBS Extremities: no acute deformity or ROM limitation. No edema.  Neuro: alert, oriented, mild delirium. Anxious.   Resolved Hospital Problem list     Assessment & Plan:   Septic shock:  MSSA bacteremia likely from HD access. No signs of PNA, no evidence of AV fistula infection.  - NE and vaso for MAP goal 65   - Holding home antihypertensives.  - Repeat cultures - ID consult pending per protocol - Doppler HD fistula.  - Will need TEE at some point, once he clinically improves.   Afib: likely in the setting of infection and active ischemic disease. S/p cardioversion. Remains in rapid fib despite amio.  - Cont amiodarone per cardiology.  Repeat bolus given - may need repeat cardioversion this afternoon.   STEMI/CAD: continued management by cardiology. On Heparin, brillinta, asa.   - Will eventually need  a return to cath lab for staged PCI - Cardiology primary.    Hyponatremia - nephrology following, planning HD today  ESRD:  - Nephrology following.  - May need CRRT if cannot tolerate HD today with pressors.    Best practice:  Diet: NPO Pain/Anxiety/Delirium protocol (if indicated):  VAP protocol (if indicated):  DVT prophylaxis: on heparin gtt GI prophylaxis: famotidine Glucose control: as needed  Mobility: bed  Code Status: Full  Family Communication: Spoke  with wife and daughter at bedside Disposition: ICU  Labs   CBC: Recent Labs  Lab 10/28/20 0604 10/28/20 0604 10/29/20 0101 10/29/20 0101 10/29/20 2121 10/29/20 2128 10/30/20 0101 10/30/20 0110 10/30/20 0259  WBC 12.7*  --  14.9*  --  12.9*  --  20.4*  --  20.9*  NEUTROABS 11.4*  --   --   --  11.8*  --   --   --   --   HGB 9.5*   < > 9.9*   < > 6.0* 8.8* 8.1* 8.2* 8.0*  HCT 29.5*   < > 31.1*   < > 18.8* 26.0* 24.6* 24.0* 24.5*  MCV 95.5  --  94.5  --  93.5  --  92.8  --  92.8  PLT 143*  --  121*  --  86*  --  126*  --  119*   < > = values in this interval not displayed.    Basic Metabolic Panel: Recent Labs  Lab 10/28/20 0604 10/28/20 0604 10/29/20 0101 10/29/20 2121 10/29/20 2128 10/30/20 0110 10/30/20 0548  NA 133*   < > 132* 132* 126* 125* 125*  K 3.7   < > 5.1 3.1* 4.8 4.9 5.2*  CL 86*  --  88* 99  --   --  86*  CO2 31  --  28 20*  --   --  22  GLUCOSE 140*  --  143* 208*  --   --  148*  BUN 30*  --  33* 48*  --   --  75*  CREATININE 9.73*  --  7.75* 7.33*  --   --  11.18*  CALCIUM 9.8  --  9.7 6.5*  --   --  9.1   < > = values in this interval not displayed.   GFR: Estimated Creatinine Clearance: 7.7 mL/min (A) (by C-G formula based on SCr of 11.18 mg/dL (H)). Recent Labs  Lab 10/29/20 0101 10/29/20 2121 10/30/20 0101 10/30/20 0259 10/30/20 0548 10/30/20 0820  WBC 14.9* 12.9* 20.4* 20.9*  --   --   LATICACIDVEN  --  2.5*  --   --  2.0* 2.1*    Liver Function Tests: Recent Labs  Lab 10/28/20 0604 10/30/20 0548  AST 84* 87*  ALT 87* 51*  ALKPHOS 146* 95  BILITOT 1.2 1.5*  PROT 6.6 5.6*  ALBUMIN 3.1* 2.2*   No results for input(s): LIPASE, AMYLASE in the last 168 hours. No results for input(s): AMMONIA in the last 168 hours.  ABG    Component Value Date/Time   PHART 7.452 (H) 10/30/2020 0110   PCO2ART 38.2 10/30/2020 0110   PO2ART 58 (L) 10/30/2020 0110   HCO3 26.5 10/30/2020 0110   TCO2 28 10/30/2020 0110   O2SAT 90.0 10/30/2020  0110     Coagulation Profile: Recent Labs  Lab 10/28/20 0629 10/30/20 0548  INR 1.3* 1.2    Cardiac Enzymes: No results for input(s): CKTOTAL, CKMB, CKMBINDEX, TROPONINI in the last 168 hours.  HbA1C: Hgb  A1c MFr Bld  Date/Time Value Ref Range Status  10/28/2020 06:04 AM 4.3 (L) 4.8 - 5.6 % Final    Comment:    (NOTE) Pre diabetes:          5.7%-6.4%  Diabetes:              >6.4%  Glycemic control for   <7.0% adults with diabetes   01/30/2015 09:09 PM 5.1 4.8 - 5.6 % Final    Comment:    (NOTE)         Pre-diabetes: 5.7 - 6.4         Diabetes: >6.4         Glycemic control for adults with diabetes: <7.0     CBG: Recent Labs  Lab 10/28/20 1120  GLUCAP 111*    Review of Systems:   Unable to assess in acute emergent setting   Past Medical History  He,  has a past medical history of Acute ST elevation myocardial infarction (STEMI) of anterolateral wall ([place]) (10/28/2020), Anemia of chronic disease (01/30/2015), Coronary artery disease involving native coronary artery of native heart with unstable angina pectoris ([place]) (10/28/2020), End-stage renal disease on hemodialysis ([place]), GERD (gastroesophageal reflux disease), Hyperlipidemia with target LDL less than 70 (10/28/2020), Hypertension, Pneumonia, Presence of drug coated stent in LAD coronary artery (10/28/2020), and Sleep apnea.   Surgical History    Past Surgical History:  Procedure Laterality Date  . AV FISTULA PLACEMENT Left 12/22/2017   Procedure: REPAIR PSEUDOANEURYSM ARTERIOVENOUS (AV) FISTULA;  Surgeon: [name], MD;  Location: [place];  Service: Vascular;  Laterality: Left;  . BASCILIC VEIN TRANSPOSITION Left 01/05/2014   Procedure: LEFT 1ST STAGE BASCILIC VEIN TRANSPOSITION;  Surgeon: Conrad Ringtown, MD;  Location: [facility];  Service: Vascular;  Laterality: Left;  . BASCILIC VEIN TRANSPOSITION Left 07/26/2014   Procedure: Left Arm Brachial Vein Transposition Second Stage;  Surgeon: Conrad Laurel Hill, MD;   Location: [place];  Service: Vascular;  Laterality: Left;  . COLONOSCOPY    . COLONOSCOPY N/A 02/02/2015   Procedure: COLONOSCOPY;  Surgeon: [name], MD;  [facility] END[facility]COPY;  Service: Endoscopy;  Laterality: N/A;  . CORONARY STENT INTERVENTION N/A 10/28/2020   Procedure: CORONARY STENT INTERVENTION;  Surgeon: Leonie Man, MD;  Location: [place] CV LAB;  Service: Cardiovascular;  Laterality: N/A;  . CORONARY/GRAFT ACUTE MI REVASCULARIZATION N/A 10/28/2020   Procedure: Coronary/Graft Acute MI Revascularization;  Surgeon: Leonie Man, MD;  Location: [place] CV LAB;  Service: Cardiovascular;  Laterality: N/A;  . ESOPHAGOGASTRODUODENOSCOPY Left 01/31/2015   Procedure: ESOPHAGOGASTRODUODENOSCOPY (EGD);  Surgeon: [name], MD;  [facility] END[facility]COPY;  Service: Endoscopy;  Laterality: Left;  . EVALUATION UNDER ANESTHESIA WITH HEMORRHOIDECTOMY N/A 02/26/2018   Procedure: ANORECTAL EXAM UNDER ANESTHESIA  HEMORRHOIDECTOMY x 2  HEMORRHOIDAL LIGATION AND PEXY;  Surgeon: Antoino Boston, MD;  Location: [facility] OR[facility];  Service: General;  Laterality: N/A;  . GIVENS CAPSULE STUDY N/A 01/31/2015   Procedure: GIVENS CAPSULE STUDY;  Surgeon: [name], MD;  Location[facility] ENDOSCOPY;  Service: Endoscopy;  Laterality: N/A;  . LEFT HEART CATH AND CORONARY ANGIOGRAPHY N/A 10/28/2020   Procedure: LEFT HEART CATH AND CORONARY ANGIOGRAPHY;  Surgeon: Leonie Man, MD;  Location: [place] CV LAB;  Service: Cardiovascular;  Laterality: N/A;  . MOUTH SURGERY     teeth cleaning  . NEPHRECTOMY RADICAL Right 05/2015   [facility] Dr [name] for R renal mass     Social History   reports that he  quit smoking about 7 years ago. His smoking use included cigars. He quit after 4.00 years of use. He has never used smokeless tobacco. He reports that he does not drink alcohol and does not use drugs.   Family History   His family history includes Hypertension in his father, mother, and sister.    Allergies Allergies  Allergen Reactions  . Ibuprofen Hives  . Lisinopril Swelling    PT states he is allergic to all prils; caused facial swelling  . Naproxen Hives and Other (See Comments)    Alleve causes patient to have hives     Home Medications  Prior to Admission medications   Medication Sig Start Date End Date Taking? Authorizing Provider  acetaminophen (TYLENOL) 325 MG tablet Take 650 mg by mouth every 6 (six) hours as needed for mild pain, fever or headache.   Yes [provider]  AURYXIA 1 GM 210 MG(Fe) tablet Take 210-630 mg by mouth See admin instructions. 630mg  three times daily with meals and 210-420mg  with snacks 10/03/17  Yes [provider]  cinacalcet (SENSIPAR) 60 MG tablet Take 120 mg by mouth daily. 10/14/20  Yes [provider]  multivitamin (RENA-VIT) TABS tablet Take 1 tablet by mouth daily. 10/13/20  Yes [provider]  Vitamin D, Ergocalciferol, (DRISDOL) 1.25 MG (50000 UNIT) CAPS capsule Take 50,000 Units by mouth once a week. 10/17/20  Yes [provider]  iron polysaccharides (NIFEREX) 150 MG capsule Take 1 capsule (150 mg total) by mouth 2 (two) times daily. Patient not taking: Reported on 10/28/2020 02/02/15   [name], MD  labetalol (NORMODYNE) 300 MG tablet Take 1 tablet (300 mg total) by mouth 2 (two) times daily. Patient not taking: Reported on 10/28/2020 02/02/15   [name], MD     Critical care time: 40 mins     Georgann Housekeeper, AGACNP-BC [place] for personal pager PCCM on call pager [phone]  10/30/2020 11:14 AM

## 2020-10-30 NOTE — Progress Notes (Signed)
Progress Note  Patient Name: Travis [name] Date of Encounter: 10/30/2020  [facility] Cardiologist: Glenetta Hew, MD   Patient Profile     59 y.o. male with ESRD on IHD, Ascending AoTAA - admitted with  Anterior STEMI 11/21 ->  PCI of the LAD. He has residual disease in his right coronary and AVG-Cx for planned PCI.  PTA was not feeing well for several days - chills & nausea.  On Cath - LVEDP 30-35 mmHg -> had been drinking lots of H20 & in settting of ACS - acute DHF --> developed pulmonary Edema = Acute HD.   Was then febrile PM 11/20-21 - > now 2/2 BC + Staph a (1 reported MSSA).  Overnight 11/21-22 -> went into Afib RVR - became hypotensive - Now in Septic Shock.   Principal Problem:   Acute ST elevation myocardial infarction (STEMI) of anterolateral wall (HCC) Active Problems:   End stage renal disease on dialysis ([place])   3 V- CAD w/ ACS/STEMI: Culprit = 99% mLAD @ D1 (DES PCI jailing D1); 99% ost-AVGLCx, 80% calcified napkin ring prox RCA.    Presence of drug coated stent in LAD coronary artery: Resolute Onyx DES 2.75 mm x 18 mm (3.1 mm) at major D1 & SP1.   Acute combined systolic and diastolic heart failure (HCC)   Atrial fibrillation with RVR (HCC)   MSSA bacteremia   Septic shock due to Staphylococcus aureus (HCC)   Hyperlipidemia with target LDL less than 70   Essential hypertension   Subjective   Overnight - on-=call MD called for high HR - noted Afib RVR ~180 bpm.  No CP - BP down to 90s-100s --> no benefit from Adenosine (not SVT). Amio Bolus & gtt started - > PCCM consulted for DCCV (with SBP 60s)- with NSR, BP stabilized. Unfortunately this AM - back in Afib RVR. Bedside Echo eF <35% (while in Afib) -> currently in Septic Shock on Levophed (initially Phenyephrine b/c Afib RVR).  Febrile PM 11/20 - Blood cultures now Staph x 2 (1 MSSA as of 11/21) - WBC > 20 - initially started on Vanc & Zosyn - consolidated to Cefazolin - current Tmax (with Tylenol being  given) 99.9   Currently he does not complain of significant dyspnea or chest discomfort. He said that last night his chest felt low but unremarkable. He has some discomfort that improves with turning in the bed. He just wants to take a shower.   Inpatient Medications    Scheduled Meds: . aspirin  81 mg Oral Daily  . atorvastatin  80 mg Oral q1800  . Chlorhexidine Gluconate Cloth  6 each Topical Q0600  . doxercalciferol  5 mcg Intravenous Q M,W,F-HD  . famotidine  10 mg Oral Daily  . ferric citrate  210 mg Oral With snacks  . ferric citrate  630 mg Oral TID with meals  . mouth rinse  15 mL Mouth Rinse BID  . mupirocin ointment  1 application Nasal BID  . sodium chloride flush  10-40 mL Intracatheter Q12H  . sodium chloride flush  3 mL Intravenous Q12H  . sodium zirconium cyclosilicate  10 g Oral Once  . Thrombi-Pad  1 each Topical Once  . ticagrelor  90 mg Oral BID   Continuous Infusions: . sodium chloride 10 mL/hr at 10/29/20 2300  . sodium chloride Stopped (10/28/20 0954)  . sodium chloride 10 mL/hr at 10/30/20 0600  . amiodarone 30 mg/hr (10/30/20 0600)  .  ceFAZolin (ANCEF) IV  Stopped (10/30/20 0019)  . heparin 1,400 Units/hr (10/30/20 0345)  . nitroGLYCERIN Stopped (10/28/20 2100)  . norepinephrine (LEVOPHED) Adult infusion 15 mcg/min (10/30/20 0600)  . phenylephrine (NEO-SYNEPHRINE) Adult infusion Stopped (10/29/20 2237)  . vasopressin 0.03 Units/min (10/30/20 0600)   PRN Meds: sodium chloride, acetaminophen, morphine injection, nitroGLYCERIN, ondansetron (ZOFRAN) IV, oxyCODONE, sodium chloride flush, sodium chloride flush   Vital Signs    Vitals:   10/30/20 0915 10/30/20 0930 10/30/20 0945 10/30/20 1000  BP:   (!) 82/63 90/67  Pulse: (!) 52 (!) 46  98  Resp: (!) 36 (!) 51 (!) 48 (!) 42  Temp:      TempSrc:      SpO2: 93% 97%  98%  Weight:      Height:        Intake/Output Summary (Last 24 hours) at 10/30/2020 1004 Last data filed at 10/30/2020 0600 Gross  per 24 hour  Intake 1389.07 ml  Output --  Net 1389.07 ml   Last 3 Weights 10/29/2020 10/28/2020 10/28/2020  Weight (lbs) 182 lb 8.7 oz 190 lb 7.6 oz 190 lb 7.6 oz  Weight (kg) 82.8 kg 86.4 kg 86.4 kg      Telemetry    Afib RVR 110-130 bpm - Personally Reviewed  ECG    AFib RVR 129 bpm.  LAFB (-45). Evolutionary changes of Anterior STEMI - STE  Still elevated with TWI V2 *V4, TWI I & aVL. - Personally Reviewed  Physical Exam   GEN:  Resting relatively comfortably. Anxious but no obvious distress. Was sleeping when I walked in. But easily anxious Neck: ++JVD to angle of the jaw with cannon A waves. Cardiac:  Tachycardic with irregularly regular rhythm., No obvious M/R/G. Respiratory: Clear to auscultation bilaterally. Nonlabored  GI: Soft, nontender, non-distended -> mildly tender to palpation. MS: No edema; No deformity. Neuro:  Nonfocal  Psych: Normal mood and affect, anxious.  Labs    High Sensitivity Troponin:   Recent Labs  Lab 10/28/20 0629 10/28/20 1046 10/28/20 2040 10/29/20 2121  TROPONINIHS 3,155* >27,000* >27,000* >27,000*      Chemistry Recent Labs  Lab 10/28/20 0604 10/28/20 0604 10/29/20 0101 10/29/20 0101 10/29/20 2121 10/29/20 2121 10/29/20 2128 10/30/20 0110 10/30/20 0548  NA 133*   < > 132*   < > 132*   < > 126* 125* 125*  K 3.7   < > 5.1   < > 3.1*   < > 4.8 4.9 5.2*  CL 86*   < > 88*  --  99  --   --   --  86*  CO2 31   < > 28  --  20*  --   --   --  22  GLUCOSE 140*   < > 143*  --  208*  --   --   --  148*  BUN 30*   < > 33*  --  48*  --   --   --  75*  CREATININE 9.73*   < > 7.75*  --  7.33*  --   --   --  11.18*  CALCIUM 9.8   < > 9.7  --  6.5*  --   --   --  9.1  PROT 6.6  --   --   --   --   --   --   --  5.6*  ALBUMIN 3.1*  --   --   --   --   --   --   --  2.2*  AST 84*  --   --   --   --   --   --   --  87*  ALT 87*  --   --   --   --   --   --   --  51*  ALKPHOS 146*  --   --   --   --   --   --   --  95  BILITOT 1.2  --    --   --   --   --   --   --  1.5*  GFRNONAA 6*   < > 7*  --  8*  --   --   --  5*  ANIONGAP 16*   < > 16*  --  13  --   --   --  17*   < > = values in this interval not displayed.     Hematology Recent Labs  Lab 10/29/20 2121 10/29/20 2128 10/30/20 0101 10/30/20 0110 10/30/20 0259  WBC 12.9*  --  20.4*  --  20.9*  RBC 2.01*  --  2.65*  --  2.64*  HGB 6.0*   < > 8.1* 8.2* 8.0*  HCT 18.8*   < > 24.6* 24.0* 24.5*  MCV 93.5  --  92.8  --  92.8  MCH 29.9  --  30.6  --  30.3  MCHC 31.9  --  32.9  --  32.7  RDW 14.5  --  14.6  --  14.4  PLT 86*  --  126*  --  119*   < > = values in this interval not displayed.    BNPNo results for input(s): BNP, PROBNP in the last 168 hours.   DDimer No results for input(s): DDIMER in the last 168 hours.   Radiology    DG CHEST PORT 1 VIEW  Result Date: 10/28/2020 CLINICAL DATA:  Acute MI.  Shortness of breath. EXAM: PORTABLE CHEST 1 VIEW COMPARISON:  January 30, 2015 FINDINGS: The cardiomediastinal silhouette is stable. Mild increased interstitial opacities seen in the perihilar regions, right greater than left. No pneumothorax. No other acute abnormalities. IMPRESSION: Mild perihilar opacities are suggestive of mild edema given the history provided of an acute myocardial infarction. Recommend attention on follow-up. Electronically Signed   By: Dorise Bullion III M.D   On: 10/28/2020 13:40    Cardiac Studies    Cath PCI 11/20: SUMMARY  Acute anterolateral ST elevation MI with Culprit lesion being the 99% mid LAD lesion (just past major 1st Diag/D1) along with multivessel CAD including 99% ostial AV groove LCx and 80% calcified napkin ring proximal RCA lesion with extensive calcification throughout the RCA. ? Successful PTCA and DES PCI of the LAD crossing D1 -resolute Onyx DES 2.75 mm x 18 mm postdilated to 3.1 mm. ? With PCI, there was stabilization of significant ectopy, AIVR and PVCs  Mild to moderate reduced EF with anterior  anterolateral hypokinesis, EF roughly 40 to 45%.  Initial evaluation suggested normal EDP, but on recheck post PCI LVEDP of 30 mmHg, severely elevated consistent with ACUTE DIASTOLIC HEART FAILURE  Significantly dilated aortic root requiring AL-1 guide catheter for both Left and Right Coronary Angiography.   RECOMMENDATIONS  Admit to CCU, restart IV heparin 8 hours after sheath removal  Check 2D echo for better assessment of EF  Plan to review cath films with other IC cardiologist, anticipate staged PCI of at least the RCA which may require atherectomy versus  lithotripsy, and likely the ostial AV groove circumflex.  If he has signs of dyspnea, consider earlier dialysis than tomorrow. ->  Would consult for nephrology to see today.  He is already on labetalol and Norvasc.  We will continue current home medications.  Uninterrupted DAPT times X 1 year.   Echo currently pending - but will wait until out of RVR. Bedside Echo yesterday   LFemV Central line [facility] 11/21   Assessment & Plan    Principal Problem:   Acute ST elevation myocardial infarction (STEMI) of anterolateral wall (HCC) Active Problems:   End stage renal disease on dialysis ([place])   3 V- CAD w/ ACS/STEMI: Culprit = 99% mLAD @ D1 (DES PCI jailing D1); 99% ost-AVGLCx, 80% calcified napkin ring prox RCA.    Presence of drug coated stent in LAD coronary artery: Resolute Onyx DES 2.75 mm x 18 mm (3.1 mm) at major D1 & SP1.   Acute combined systolic and diastolic heart failure (HCC)   Atrial fibrillation with RVR (HCC)   MSSA bacteremia   Septic shock due to Staphylococcus aureus (HCC)   Hyperlipidemia with target LDL less than 70   Essential hypertension  Principal Problem:   Acute ST elevation myocardial infarction (STEMI) of anterolateral wall (HCC) /  3 V- CAD w/ ACS/STEMI: Culprit = 99% mLAD @ D1 (DES PCI jailing D1); 99% ost-AVGLCx, 80% calcified napkin ring prox RCA. /  Presence of drug coated stent in  LAD coronary artery: Resolute Onyx DES 2.75 mm x 18 mm (3.1 mm) at major D1 & SP1.  PCI of LAD- with planned staged PCI of RCA & AVG Cx-  Will have to put on hold given MSSA bacteremia  Continue IV Heparin for CAD & Afib (although with dropping Hgb - monitor for bleeding)  High dose statin  BB& CCB on hold for Septic Shock  DAPT for now with ASA/Brilinta -- but will change to Plavix/ASA/Eliqiuis     End stage renal disease on dialysis [facility]) - on HD M-W-F (required urgent cath on 11/20 PM 2/2 volume overload & Acute DHF- 4 L off).  Appreciate Nephrology prompt attention this weekend & continued assistance  ? If he would be intermittent HD vs. CRRT  Elevated K 5.2 = Lokelma ordered since not on CRRT currently.   Current plan is HD tomorrow    Acute combined systolic and diastolic heart failure ([place]) (complicated with Volume overload & accelerated HTN) --> resolved with HD on Saturday post Cath.   Currently off of home BP meds 2/2 hypotension/spetic shock - restart once off pressors  Volume control with HD>     Atrial fibrillation with RVR ([place]) - initially had urgent DCCV; initially back to SR shortly & retured to Afib RVR currently in 110-120s  Currently on amiodarone infusion - will rebolus 150 mg & continue IV for now.  Make NPO - discuss with PCCM - may try re-do DCCV this PM to restore NSR (has been on Heparin the entire time in Afib)   Will need to change to DOAC for d/c - would then convert Brilinta to Plavix (& only 1 month ASA - TOAT)    Bacteremia due to Staphylococcus - MSSA; Septic shock due to Staphylococcus aureus ([place]) - PCCM consult & ID counsulted   2/2 BC + for MSSA = started on Vanc-Zosyn - converted to Cefazolin  On Levophed gtt - will try to wean 1-2 hr after re-bolus Amio  TTE relook ordered to assess for  vegetation - will wait to see if HR can be controlled -- May need TEE with MSSA.   WBC elevated Febrile to 102.5 on 11/21 PM - current T max  99.9  Will defer management of Abx to PCCM/ID.  He is little bit dyspneic, and given his recent CHF, will check chest x-ray.    Hyperlipidemia with target LDL less than 70:  High dose statin started.      Essential hypertension - currently hypotensive; holding home BP Meds (Amlodipine & Labetalol)    Anemia - Hgb 9.5 on admission, currently stable @ 8; on ESA per Nephrology  Monitor - may need 1 unit to help stabilize BP   Patient is very complex patient with multisystem failure-CAD, A. fib, CHF, sepsis with ESRD on HD and anemia. Critical care time: 35 minutes with patient, 6minutes per chart. Total 1:15 hour.   For questions or updates, please contact [place] Please consult [email or web address] for contact info under        Signed, Glenetta Hew, MD  10/30/2020, 10:04 AM

## 2020-10-30 NOTE — Progress Notes (Addendum)
[facility] Progress Note   Assessment/ Plan:   OP HD:AF MWF 4h 450/800 81.5kg 2/2.25 bath RUE AVF Hep 5000+ 1064midrun - hect 5 ug tiw  - mircera 50 ug q 4, last 11/15 (due 11/29)    Assessment/ Plan: 1. Acute STEMI - SP LAD PCI 11/20 am, has other disease in LCx/ RCA. Per cardiology. 2. SOB - had pulm edema by CXR w/ SOB / coughing. Better today, 4 L off Sat afternoon on HD. On pressors now with Afib into the 130s-140s- if lower in the 110s may be able to do intermittent HD otherwise will need to consider CRRT.  Addend: appears that HR is in the 90s, attempting intermittent HD today with lower BFR (350).  3. MSSA bacteremia- on cefazolin  4. ESRD - usual HD MWF. Had LHC w/ high LVEDP +35 mmHg. Lowering vol w/ HD as tolerated.  As above 5. HTN/ voloverload- 4 L off yest, BP's much better in 120/80 range. Lower vol further w/ HD as BP will tolerate.  6. 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 Afib: on amio and hep gtt  Subjective:    Found to have MSSA bacteremia, on pressors now.  On Cefazolin.  Afib with RVR overnight, on amio gtt.     Objective:   BP 105/78   Pulse (!) 127   Temp 98.9 F (37.2 C)   Resp 17   Ht 5\' 11"  (1.803 m)   Wt 82.8 kg   SpO2 95%   BMI 25.46 kg/m   Physical Exam: QQI:WLNLG in bed, NAD XQJ:JHERDEYCXKG, no m/r/g Resp: slightly tachypneic Abd: soft Ext: no gross edema ACCESS: LUE AVF + T/B  Labs: BMET Recent Labs  Lab 10/28/20 0604 10/29/20 0101 10/29/20 2121 10/29/20 2128 10/30/20 0110 10/30/20 0548  NA 133* 132* 132* 126* 125* 125*  K 3.7 5.1 3.1* 4.8 4.9 5.2*  CL 86* 88* 99  --   --  86*  CO2 31 28 20*  --   --  22  GLUCOSE 140* 143* 208*  --   --  148*  BUN 30* 33* 48*  --   --  75*  CREATININE 9.73* 7.75* 7.33*  --   --  11.18*  CALCIUM 9.8 9.7 6.5*  --   --  9.1   CBC Recent Labs  Lab 10/28/20 0604  10/28/20 0604 10/29/20 0101 10/29/20 0101 10/29/20 2121 10/29/20 2121 10/29/20 2128 10/30/20 0101 10/30/20 0110 10/30/20 0259  WBC 12.7*   < > 14.9*  --  12.9*  --   --  20.4*  --  20.9*  NEUTROABS 11.4*  --   --   --  11.8*  --   --   --   --   --   HGB 9.5*   < > 9.9*   < > 6.0*   < > 8.8* 8.1* 8.2* 8.0*  HCT 29.5*   < > 31.1*   < > 18.8*   < > 26.0* 24.6* 24.0* 24.5*  MCV 95.5   < > 94.5  --  93.5  --   --  92.8  --  92.8  PLT 143*   < > 121*  --  86*  --   --  126*  --  119*   < > = values in this interval not displayed.      Medications:    .  amLODipine  10 mg Oral Daily  . aspirin  81 mg Oral Daily  . atorvastatin  80 mg Oral q1800  . Chlorhexidine Gluconate Cloth  6 each Topical Q0600  . doxercalciferol  5 mcg Intravenous Q M,W,F-HD  . famotidine  10 mg Oral Daily  . ferric citrate  210 mg Oral With snacks  . ferric citrate  630 mg Oral TID with meals  . labetalol  300 mg Oral BID  . mouth rinse  15 mL Mouth Rinse BID  . mupirocin ointment  1 application Nasal BID  . sodium chloride flush  10-40 mL Intracatheter Q12H  . sodium chloride flush  3 mL Intravenous Q12H  . Thrombi-Pad  1 each Topical Once  . ticagrelor  90 mg Oral BID     Madelon Lips MD 10/30/2020, 9:04 AM

## 2020-10-30 NOTE — Progress Notes (Signed)
[place] for Heparin Indication: chest pain/ACS  Allergies  Allergen Reactions  . Ibuprofen Hives  . Lisinopril Swelling    PT states he is allergic to all prils; caused facial swelling  . Naproxen Hives and Other (See Comments)    Alleve causes patient to have hives    Patient Measurements: Height: 5\' 11"  (180.3 cm) Weight: 82.8 kg (182 lb 8.7 oz) IBW/kg (Calculated) : 75.3 Heparin Dosing Weight: 86.4 kg   Vital Signs: Temp: 97.8 F (36.6 C) (11/21 2341) Temp Source: Axillary (11/21 2341) BP: 105/78 (11/22 0300) Pulse Rate: 127 (11/22 0300)  Labs: Recent Labs    10/28/20 0604 10/28/20 0604 10/28/20 0629 10/28/20 0629 10/28/20 1046 10/28/20 2040 10/29/20 0101 10/29/20 0101 10/29/20 1432 10/29/20 2121 10/29/20 2121 10/29/20 2128 10/29/20 2128 10/30/20 0101 10/30/20 0110 10/30/20 0259  HGB 9.5*   < >  --   --   --   --  9.9*   < >  --  6.0*   < > 8.8*   < > 8.1* 8.2*  --   HCT 29.5*   < >  --   --   --   --  31.1*   < >  --  18.8*   < > 26.0*  --  24.6* 24.0*  --   PLT 143*   < >  --   --   --   --  121*  --   --  86*  --   --   --  126*  --   --   APTT  --   --  36  --   --   --   --   --   --   --   --   --   --   --   --   --   LABPROT  --   --  15.3*  --   --   --   --   --   --   --   --   --   --   --   --   --   INR  --   --  1.3*  --   --   --   --   --   --   --   --   --   --   --   --   --   HEPARINUNFRC  --   --   --   --   --   --   --   --  <0.10*  --   --   --   --   --   --  0.10*  CREATININE 9.73*  --   --   --   --   --  7.75*  --   --  7.33*  --   --   --   --   --   --   TROPONINIHS  --   --  3,155*   < > >27,000* >27,000*  --   --   --  >27,000*  --   --   --   --   --   --    < > = values in this interval not displayed.    Estimated Creatinine Clearance: 11.7 mL/min (A) (by C-G formula based on SCr of 7.33 mg/dL (H)).   Medical History: Past Medical History:  Diagnosis Date  . Acute ST elevation  myocardial infarction (STEMI) of anterolateral wall (  [place]) 10/28/2020   Culprit lesion, 99% ulcerated mid LAD at D1 -> DES PCI  . Anemia of chronic disease 01/30/2015  . Coronary artery disease involving native coronary artery of native heart with unstable angina pectoris ([place]) 10/28/2020   Acute anterolateral STEMI: 3V CAD: Culprit Lesion = 99% mid LAD lesion (just after D1) - DES PCI; 99% ostial AV groove LCx; & 80% calcified napkin ring proximal RCA lesion with extensive calcification throughout the RCA. Successful PTCA and DES PCI of the LAD crossing D1 -resolute Onyx DES 2.75 mm x 18 mm postdilated to 3.1 mm. With PCI, there  . End-stage renal disease on hemodialysis ([place])   . GERD (gastroesophageal reflux disease)    takes Nexium  . Hyperlipidemia with target LDL less than 70 10/28/2020  . Hypertension   . Pneumonia    as a child  . Presence of drug coated stent in LAD coronary artery 10/28/2020   Proximal-mid LAD 99% ->0% -> Resolute Onyx DES 2.75 mm x 18 mm (3.1 mm)  . Sleep apnea    uses c-pap 2 yrs    Medications:  Scheduled:  . amLODipine  10 mg Oral Daily  . aspirin  81 mg Oral Daily  . atorvastatin  80 mg Oral q1800  . Chlorhexidine Gluconate Cloth  6 each Topical Q0600  . doxercalciferol  5 mcg Intravenous Q M,W,F-HD  . famotidine  10 mg Oral Daily  . ferric citrate  210 mg Oral With snacks  . ferric citrate  630 mg Oral TID with meals  . labetalol  300 mg Oral BID  . mouth rinse  15 mL Mouth Rinse BID  . mupirocin ointment  1 application Nasal BID  . sodium chloride flush  10-40 mL Intracatheter Q12H  . sodium chloride flush  3 mL Intravenous Q12H  . Thrombi-Pad  1 each Topical Once  . ticagrelor  90 mg Oral BID    Assessment: 61 yom presenting with STEMI. Known to have dilated ascending aorta, ESRD. Found to have 99% mid LAD with multivessel in LCx and RCA, now s/p DES to LAD. Plan for heparin restart 8 hr after sheath removal possible staged PCI of RCA and LCx.    11/22 AM update:  Heparin level remains low Had some new line bleeding late last night that has resolved   Goal of Therapy:  Heparin level 0.3-0.7 units/ml Monitor platelets by anticoagulation protocol: Yes   Plan:  Increase heparin to 1400 units/hr Recheck heparin level in 6-8 hours  Narda Bonds, PharmD, [place] Pharmacist Phone: [phone]

## 2020-10-30 NOTE — Progress Notes (Signed)
Orthopedic Tech Progress Note Patient Details:  Travis [name] [birth year] [id number]  Ortho Devices Type of Ortho Device: Knee Immobilizer Ortho Device/Splint Location: delivered Knee immobilizer to RN       Karolee Stamps 10/30/2020, 5:11 AM

## 2020-10-30 NOTE — Consult Note (Signed)
[facility]    Date of Admission:  10/28/2020     Reason for Consult: mssa bacteremia    Referring Provider: [name]   Lines:  11/21-c left groin cvc Peripheral iv's  Abx: 11/22-c cefazolin 1 gram qhs 11/21 vanc/piptazo         Assessment: 59 yo male no ivdu history, pmh htn, esrd on iHD, cad,  Admitted with sepsis and anterior wall stemi, s/p emergent PCI, found to have septic shock post procedure and mssa bacteremia  Patient reports fever/chill 3 days prior to admission, then on the day of admission chest pain. This suggest sepsis/BSI likely from dialysis source with resultant stemi  Would call this complicated mssa BSI and will need TEE to r/o endocarditis  11/21 bcx positive; 11/22 pending 11/22 tte is in process.   No other hardware. The fistula is native. There is reported infection of coronary stent but rare  Plan: 1. F/u tte; will consider tee once his hemodynamics is a little better 2. Repeat bcx  3. Continue renal dosed cefazolin  Principal Problem:   Acute ST elevation myocardial infarction (STEMI) of anterolateral wall (HCC) Active Problems:   End stage renal disease on dialysis [facility])   Essential hypertension   Hyperlipidemia with target LDL less than 70   3 V- CAD w/ ACS/STEMI: Culprit = 99% mLAD @ D1 (DES PCI jailing D1); 99% ost-AVGLCx, 80% calcified napkin ring prox RCA.    Presence of drug coated stent in LAD coronary artery: Resolute Onyx DES 2.75 mm x 18 mm (3.1 mm) at major D1 & SP1.   Acute diastolic heart failure (HCC)   Atrial fibrillation with RVR (HCC)   Bacteremia due to Staphylococcus - MSSA   Septic shock due to Staphylococcus aureus (HCC)   Scheduled Meds: . aspirin  81 mg Oral Daily  . atorvastatin  80 mg Oral q1800  . Chlorhexidine Gluconate Cloth  6 each Topical Q0600  . doxercalciferol  5 mcg Intravenous Q M,W,F-HD  . famotidine  10 mg Oral Daily  . ferric citrate  210 mg Oral With snacks   . ferric citrate  630 mg Oral TID with meals  . mouth rinse  15 mL Mouth Rinse BID  . mupirocin ointment  1 application Nasal BID  . sodium chloride flush  10-40 mL Intracatheter Q12H  . sodium chloride flush  3 mL Intravenous Q12H  . sodium zirconium cyclosilicate  10 g Oral Once  . Thrombi-Pad  1 each Topical Once  . ticagrelor  90 mg Oral BID   Continuous Infusions: . sodium chloride 10 mL/hr at 10/29/20 2300  . sodium chloride Stopped (10/28/20 0954)  . sodium chloride 10 mL/hr at 10/30/20 0600  . amiodarone 30 mg/hr (10/30/20 0600)  .  ceFAZolin (ANCEF) IV Stopped (10/30/20 0019)  . heparin 1,400 Units/hr (10/30/20 0345)  . nitroGLYCERIN Stopped (10/28/20 2100)  . norepinephrine (LEVOPHED) Adult infusion 15 mcg/min (10/30/20 0600)  . phenylephrine (NEO-SYNEPHRINE) Adult infusion Stopped (10/29/20 2237)  . vasopressin 0.03 Units/min (10/30/20 0600)   PRN Meds:.sodium chloride, acetaminophen, morphine injection, nitroGLYCERIN, ondansetron (ZOFRAN) IV, oxyCODONE, sodium chloride flush, sodium chloride flush  HPI: Travis [name] is a 59 y.o. male esrd admitted 11/20 for chest pain, found to have sepsis and stemi  Patient reports 3 days of subjective fever/malaise. On the day of admission he developed chest pain, the reason for his presentation to admission  He has been getting dialysis via  his native avf LUE  No other procedure/hospital admission recently No dental pain No rash No skin trauma Denies ivdu  No prior bsi from dialysis in the past No hardware in his body  He was found to have anterior stemi on presentation and underwent emergent left heart cath 3 V- CAD w/ ACS/STEMI: Culprit = 99% mLAD @ D1 (DES PCI jailing D1); 99% ost-AVGLCx, 80% calcified napkin ring prox RCA.  Presence of drug coated stent in LAD coronary artery: Resolute Onyx DES 2.75 mm x 18 mm (3.1 mm) at major D1 & SP1.  He is doing well post procedure. Last fever yesterday. bcx obtained after left  heart cath and had returned with mssa. He is on appropriate abx  Denies headache, visual change, rash, or localizing pain     Review of Systems: ROS Negative 11 point ros unless mentioned above  Past Medical History:  Diagnosis Date  . Acute ST elevation myocardial infarction (STEMI) of anterolateral wall ([place]) 10/28/2020   Culprit lesion, 99% ulcerated mid LAD at D1 -> DES PCI  . Anemia of chronic disease 01/30/2015  . Coronary artery disease involving native coronary artery of native heart with unstable angina pectoris ([place]) 10/28/2020   Acute anterolateral STEMI: 3V CAD: Culprit Lesion = 99% mid LAD lesion (just after D1) - DES PCI; 99% ostial AV groove LCx; & 80% calcified napkin ring proximal RCA lesion with extensive calcification throughout the RCA. Successful PTCA and DES PCI of the LAD crossing D1 -resolute Onyx DES 2.75 mm x 18 mm postdilated to 3.1 mm. With PCI, there  . End-stage renal disease on hemodialysis ([place])   . GERD (gastroesophageal reflux disease)    takes Nexium  . Hyperlipidemia with target LDL less than 70 10/28/2020  . Hypertension   . Pneumonia    as a child  . Presence of drug coated stent in LAD coronary artery 10/28/2020   Proximal-mid LAD 99% ->0% -> Resolute Onyx DES 2.75 mm x 18 mm (3.1 mm)  . Sleep apnea    uses c-pap 2 yrs    Social History   Tobacco Use  . Smoking status: Former Smoker    Years: 4.00    Types: Cigars    Quit date: 12/31/2012    Years since quitting: 7.8  . Smokeless tobacco: Never Used  Vaping Use  . Vaping Use: Never used  Substance Use Topics  . Alcohol use: No  . Drug use: No    Family History  Problem Relation Age of Onset  . Hypertension Mother   . Hypertension Father   . Hypertension Sister    Allergies  Allergen Reactions  . Ibuprofen Hives  . Lisinopril Swelling    PT states he is allergic to all prils; caused facial swelling  . Naproxen Hives and Other (See Comments)    Alleve causes patient to  have hives    OBJECTIVE: Blood pressure 105/78, pulse (!) 127, temperature 98.9 F (37.2 C), resp. rate 17, height 5\' 11"  (1.803 m), weight 82.8 kg, SpO2 95 %.  Physical Exam No distress, pleasant, conversant Normocephalic; per; conj clear Neck supple cv rrr no mrg Lungs clear normal respiratory effort abd s/nt Ext no edema Skin no rash Neuro cn2-12 intact, strength/reflex itnact Psych alert/oriented msk no back tenderness or peripheral joint tenderness/swelling  Lab Results Lab Results  Component Value Date   WBC 20.9 (H) 10/30/2020   HGB 8.0 (L) 10/30/2020   HCT 24.5 (L) 10/30/2020   MCV 92.8 10/30/2020  PLT 119 (L) 10/30/2020    Lab Results  Component Value Date   CREATININE 11.18 (H) 10/30/2020   BUN 75 (H) 10/30/2020   NA 125 (L) 10/30/2020   K 5.2 (H) 10/30/2020   CL 86 (L) 10/30/2020   CO2 22 10/30/2020    Lab Results  Component Value Date   ALT 51 (H) 10/30/2020   AST 87 (H) 10/30/2020   ALKPHOS 95 10/30/2020   BILITOT 1.5 (H) 10/30/2020     Microbiology: Recent Results (from the past 240 hour(s))  Respiratory Panel by RT PCR (Flu A&B, Covid) - Nasopharyngeal Swab     Status: None   Collection Time: 10/28/20  6:16 AM   Specimen: Nasopharyngeal Swab; Nasopharyngeal(NP) swabs in vial transport medium  Result Value Ref Range Status   SARS Coronavirus 2 by RT PCR NEGATIVE NEGATIVE Final    Comment: (NOTE) SARS-CoV-2 target nucleic acids are NOT DETECTED.  The SARS-CoV-2 RNA is generally detectable in upper respiratoy specimens during the acute phase of infection. The lowest concentration of SARS-CoV-2 viral copies this assay can detect is 131 copies/mL. A negative result does not preclude SARS-Cov-2 infection and should not be used as the sole basis for treatment or other patient management decisions. A negative result may occur with  improper specimen collection/handling, submission of specimen other than nasopharyngeal swab, presence of viral  mutation(s) within the areas targeted by this assay, and inadequate number of viral copies (<131 copies/mL). A negative result must be combined with clinical observations, patient history, and epidemiological information. The expected result is Negative.  Fact Sheet for Patients:  PinkCheek.be  Fact Sheet for Healthcare Providers:  GravelBags.it  This test is no t yet approved or cleared by the [place] FDA and  has been authorized for detection and/or diagnosis of SARS-CoV-2 by FDA under an Emergency Use Authorization (EUA). This EUA will remain  in effect (meaning this test can be used) for the duration of the COVID-19 declaration under Section 564(b)(1) of the Act, 21 U.S.C. section 360bbb-3(b)(1), unless the authorization is terminated or revoked sooner.     Influenza A by PCR NEGATIVE NEGATIVE Final   Influenza B by PCR NEGATIVE NEGATIVE Final    Comment: (NOTE) The Xpert Xpress SARS-CoV-2/FLU/RSV assay is intended as an aid in  the diagnosis of influenza from Nasopharyngeal swab specimens and  should not be used as a sole basis for treatment. Nasal washings and  aspirates are unacceptable for Xpert Xpress SARS-CoV-2/FLU/RSV  testing.  Fact Sheet for Patients: PinkCheek.be  Fact Sheet for Healthcare Providers: GravelBags.it  This test is not yet approved or cleared by the [place] FDA and  has been authorized for detection and/or diagnosis of SARS-CoV-2 by  FDA under an Emergency Use Authorization (EUA). This EUA will remain  in effect (meaning this test can be used) for the duration of the  Covid-19 declaration under Section 564(b)(1) of the Act, 21  U.S.C. section 360bbb-3(b)(1), unless the authorization is  terminated or revoked. Performed at [facility], [place] [address] [place], [place] [address]   Surgical PCR screen     Status:  Abnormal   Collection Time: 10/28/20  6:10 PM   Specimen: Nasal Mucosa; Nasal Swab  Result Value Ref Range Status   MRSA, PCR NEGATIVE NEGATIVE Final   Staphylococcus aureus POSITIVE (A) NEGATIVE Final    Comment: (NOTE) The Xpert SA Assay (FDA approved for NASAL specimens in patients 47 years of age and older), is one component of  a comprehensive surveillance program. It is not intended to diagnose infection nor to guide or monitor treatment. Performed at [facility], [place] [address], [place], [place] [address]   MRSA PCR Screening     Status: None   Collection Time: 10/28/20 10:10 PM   Specimen: Nasal Mucosa; Nasopharyngeal  Result Value Ref Range Status   MRSA by PCR NEGATIVE NEGATIVE Final    Comment:        The GeneXpert MRSA Assay (FDA approved for NASAL specimens only), is one component of a comprehensive MRSA colonization surveillance program. It is not intended to diagnose MRSA infection nor to guide or monitor treatment for MRSA infections. Performed at [facility], [place] [address], [place], [place] [address]   Culture, blood (Routine X 2) w Reflex to ID Panel     Status: Abnormal (Preliminary result)   Collection Time: 10/29/20  8:30 AM   Specimen: BLOOD LEFT HAND  Result Value Ref Range Status   Specimen Description BLOOD LEFT HAND  Final   Special Requests   Final    BOTTLES DRAWN AEROBIC AND ANAEROBIC Blood Culture adequate volume   Culture  Setup Time   Final    GRAM POSITIVE COCCI IN CLUSTERS IN BOTH AEROBIC AND ANAEROBIC BOTTLES CRITICAL RESULT CALLED TO, READ BACK BY AND VERIFIED WITH: M. BITONTI,PHARMD 2138 10/29/2020 T. TYSOR    Culture (A)  Final    STAPHYLOCOCCUS AUREUS SUSCEPTIBILITIES TO FOLLOW Performed at [facility], [place] [address] [place], [place] [address]    Report Status PENDING  Incomplete  Blood Culture ID Panel (Reflexed)     Status: Abnormal   Collection Time: 10/29/20  8:30 AM  Result Value Ref Range Status    Enterococcus faecalis NOT DETECTED NOT DETECTED Final   Enterococcus Faecium NOT DETECTED NOT DETECTED Final   Listeria monocytogenes NOT DETECTED NOT DETECTED Final   Staphylococcus species DETECTED (A) NOT DETECTED Final    Comment: CRITICAL RESULT CALLED TO, READ BACK BY AND VERIFIED WITH: M. BITONTI,PHARMD 2138 10/29/2020 T. TYSOR    Staphylococcus aureus (BCID) DETECTED (A) NOT DETECTED Final    Comment: CRITICAL RESULT CALLED TO, READ BACK BY AND VERIFIED WITH: M. BITONTI,PHARMD 2138 10/29/2020 T. TYSOR    Staphylococcus epidermidis NOT DETECTED NOT DETECTED Final   Staphylococcus lugdunensis NOT DETECTED NOT DETECTED Final   Streptococcus species NOT DETECTED NOT DETECTED Final   Streptococcus agalactiae NOT DETECTED NOT DETECTED Final   Streptococcus pneumoniae NOT DETECTED NOT DETECTED Final   Streptococcus pyogenes NOT DETECTED NOT DETECTED Final   A.calcoaceticus-baumannii NOT DETECTED NOT DETECTED Final   Bacteroides fragilis NOT DETECTED NOT DETECTED Final   Enterobacterales NOT DETECTED NOT DETECTED Final   Enterobacter cloacae complex NOT DETECTED NOT DETECTED Final   Escherichia coli NOT DETECTED NOT DETECTED Final   Klebsiella aerogenes NOT DETECTED NOT DETECTED Final   Klebsiella oxytoca NOT DETECTED NOT DETECTED Final   Klebsiella pneumoniae NOT DETECTED NOT DETECTED Final   Proteus species NOT DETECTED NOT DETECTED Final   Salmonella species NOT DETECTED NOT DETECTED Final   Serratia marcescens NOT DETECTED NOT DETECTED Final   Haemophilus influenzae NOT DETECTED NOT DETECTED Final   Neisseria meningitidis NOT DETECTED NOT DETECTED Final   Pseudomonas aeruginosa NOT DETECTED NOT DETECTED Final   Stenotrophomonas maltophilia NOT DETECTED NOT DETECTED Final   Candida albicans NOT DETECTED NOT DETECTED Final   Candida auris NOT DETECTED NOT DETECTED Final   Candida glabrata NOT DETECTED NOT  DETECTED Final   Candida krusei NOT DETECTED NOT DETECTED Final   Candida  parapsilosis NOT DETECTED NOT DETECTED Final   Candida tropicalis NOT DETECTED NOT DETECTED Final   Cryptococcus neoformans/gattii NOT DETECTED NOT DETECTED Final   Meth resistant mecA/C and MREJ NOT DETECTED NOT DETECTED Final    Comment: Performed at [facility], [address], [place], [place] [address]  Culture, blood (Routine X 2) w Reflex to ID Panel     Status: Abnormal (Preliminary result)   Collection Time: 10/29/20  8:45 AM   Specimen: BLOOD  Result Value Ref Range Status   Specimen Description BLOOD LEFT ANTECUBITAL  Final   Special Requests   Final    BOTTLES DRAWN AEROBIC AND ANAEROBIC Blood Culture adequate volume   Culture  Setup Time   Final    GRAM POSITIVE COCCI IN CLUSTERS IN BOTH AEROBIC AND ANAEROBIC BOTTLES CRITICAL RESULT CALLED TO, READ BACK BY AND VERIFIED WITH: M. BITONTI,PHARMD 2138 10/29/2020 Mena Goes Performed at [facility], [place] [address], [place], [place] [address]    Culture STAPHYLOCOCCUS AUREUS (A)  Final   Report Status PENDING  Incomplete    Jabier Mutton, [place] for Infectious [employer] [phone] pager    10/30/2020, 9:40 AM

## 2020-10-30 NOTE — Progress Notes (Signed)
[facility] for Heparin Indication: chest pain/ACS  Allergies  Allergen Reactions  . Ibuprofen Hives  . Lisinopril Swelling    PT states he is allergic to all prils; caused facial swelling  . Naproxen Hives and Other (See Comments)    Alleve causes patient to have hives    Patient Measurements: Height: 5\' 11"  (180.3 cm) Weight: 82.8 kg (182 lb 8.7 oz) IBW/kg (Calculated) : 75.3 Heparin Dosing Weight: 86.4 kg   Vital Signs: Temp: 97.7 F (36.5 C) (11/22 1530) Temp Source: Axillary (11/22 1530) BP: 119/79 (11/22 1545) Pulse Rate: 104 (11/22 1545)  Labs: Recent Labs    10/28/20 0604 10/28/20 0629 10/28/20 1046 10/28/20 2040 10/29/20 0101 10/29/20 0101 10/29/20 1432 10/29/20 2121 10/29/20 2128 10/30/20 0101 10/30/20 0101 10/30/20 0110 10/30/20 0259 10/30/20 0548 10/30/20 1250  HGB   < >  --   --   --  9.9*   < >  --  6.0*   < > 8.1*   < > 8.2* 8.0*  --   --   HCT   < >  --   --   --  31.1*   < >  --  18.8*   < > 24.6*  --  24.0* 24.5*  --   --   PLT   < >  --   --   --  121*   < >  --  86*  --  126*  --   --  119*  --   --   APTT  --  36  --   --   --   --   --   --   --   --   --   --   --   --   --   LABPROT  --  15.3*  --   --   --   --   --   --   --   --   --   --   --  15.0  --   INR  --  1.3*  --   --   --   --   --   --   --   --   --   --   --  1.2  --   HEPARINUNFRC  --   --   --   --   --   --  <0.10*  --   --   --   --   --  0.10*  --  0.15*  CREATININE   < >  --   --   --  7.75*  --   --  7.33*  --   --   --   --   --  11.18*  --   TROPONINIHS  --  3,155*   < > >27,000*  --   --   --  >27,000*  --   --   --   --   --  >27,000*  --    < > = values in this interval not displayed.    Estimated Creatinine Clearance: 7.7 mL/min (A) (by C-G formula based on SCr of 11.18 mg/dL (H)).    Assessment: 72 yom presenting with STEMI. Known to have dilated ascending aorta, ESRD. Found to have 99% mid LAD with multivessel in  LCx and RCA, now s/p DES to LAD. Heparin was started post sheath removal. Patient was found to have bacteremia and will not proceed with  cath intervention at this time. Patient has developed Afib overnight and is on an amio drip. Hgb stable at 8.0 and Plts low stable at 119. Heparin level drawn at 1200 is low at 0.15.    Goal of Therapy:  Heparin level 0.3-0.7 units/ml Monitor platelets by anticoagulation protocol: Yes   Plan:  Increase heparin to 1600 units/hr Recheck heparin level in 8 hours Monitor for s/sx of bleed and plan for continued [place], PharmD, [place] Resident [phone] 10/30/2020 3:50 PM

## 2020-10-30 NOTE — Progress Notes (Signed)
   10/30/20 1107  Clinical Encounter Type  Visited With Patient and family together  Visit Type Initial;Social support  Referral From Nurse  Consult/Referral To Chaplain  Spiritual Encounters  Spiritual Needs Emotional  Stress Factors  Patient Stress Factors Health changes;Family relationships  Family Stress Factors Health changes   Chaplain responded to page from Pt's nurse, Alyssa. Chaplain provided emotional and spiritual support to Pt and Pt's wife, Myra. Pt's PA checked in during the visit. Pt expressed concern over the toll his health takes on his loved ones. Pt was tired and going to try to rest. Chaplain remains available as needed.   This note was prepared by Chaplain Resident, Dante Gang, MDiv. Chaplain remains available as needed through the on-call pager: [phone].

## 2020-10-31 ENCOUNTER — Inpatient Hospital Stay (HOSPITAL_COMMUNITY): Payer: Medicare Other

## 2020-10-31 ENCOUNTER — Encounter (HOSPITAL_COMMUNITY): Payer: Self-pay | Admitting: Certified Registered Nurse Anesthetist

## 2020-10-31 ENCOUNTER — Inpatient Hospital Stay (HOSPITAL_COMMUNITY): Payer: Medicare Other | Admitting: Certified Registered"

## 2020-10-31 ENCOUNTER — Encounter (HOSPITAL_COMMUNITY)
Admission: EM | Disposition: A | Discharge status: Home Health | Payer: Self-pay | Source: Home / Self Care | Attending: Cardiology

## 2020-10-31 DIAGNOSIS — T8144XA Sepsis following a procedure, initial encounter: Secondary | ICD-10-CM | POA: Diagnosis not present

## 2020-10-31 DIAGNOSIS — I5043 Acute on chronic combined systolic (congestive) and diastolic (congestive) heart failure: Secondary | ICD-10-CM | POA: Diagnosis not present

## 2020-10-31 DIAGNOSIS — I361 Nonrheumatic tricuspid (valve) insufficiency: Secondary | ICD-10-CM | POA: Diagnosis not present

## 2020-10-31 DIAGNOSIS — I2102 ST elevation (STEMI) myocardial infarction involving left anterior descending coronary artery: Secondary | ICD-10-CM | POA: Diagnosis not present

## 2020-10-31 DIAGNOSIS — I4891 Unspecified atrial fibrillation: Secondary | ICD-10-CM | POA: Diagnosis not present

## 2020-10-31 DIAGNOSIS — N186 End stage renal disease: Secondary | ICD-10-CM | POA: Diagnosis not present

## 2020-10-31 DIAGNOSIS — I340 Nonrheumatic mitral (valve) insufficiency: Secondary | ICD-10-CM

## 2020-10-31 DIAGNOSIS — A4101 Sepsis due to Methicillin susceptible Staphylococcus aureus: Secondary | ICD-10-CM | POA: Diagnosis not present

## 2020-10-31 DIAGNOSIS — A419 Sepsis, unspecified organism: Secondary | ICD-10-CM

## 2020-10-31 DIAGNOSIS — I2109 ST elevation (STEMI) myocardial infarction involving other coronary artery of anterior wall: Secondary | ICD-10-CM

## 2020-10-31 DIAGNOSIS — I2510 Atherosclerotic heart disease of native coronary artery without angina pectoris: Secondary | ICD-10-CM | POA: Diagnosis not present

## 2020-10-31 DIAGNOSIS — R0600 Dyspnea, unspecified: Secondary | ICD-10-CM | POA: Insufficient documentation

## 2020-10-31 DIAGNOSIS — I132 Hypertensive heart and chronic kidney disease with heart failure and with stage 5 chronic kidney disease, or end stage renal disease: Secondary | ICD-10-CM | POA: Diagnosis not present

## 2020-10-31 HISTORY — PX: TRANSTHORACIC ECHOCARDIOGRAM: SHX275

## 2020-10-31 LAB — CBC
HCT: 25.1 % — ABNORMAL LOW (ref 39.0–52.0)
Hemoglobin: 8 g/dL — ABNORMAL LOW (ref 13.0–17.0)
MCH: 29.7 pg (ref 26.0–34.0)
MCHC: 31.9 g/dL (ref 30.0–36.0)
MCV: 93.3 fL (ref 80.0–100.0)
Platelets: 128 10*3/uL — ABNORMAL LOW (ref 150–400)
RBC: 2.69 MIL/uL — ABNORMAL LOW (ref 4.22–5.81)
RDW: 14.6 % (ref 11.5–15.5)
WBC: 16.3 10*3/uL — ABNORMAL HIGH (ref 4.0–10.5)
nRBC: 0 % (ref 0.0–0.2)

## 2020-10-31 LAB — POCT I-STAT 7, (LYTES, BLD GAS, ICA,H+H)
Acid-Base Excess: 1 mmol/L (ref 0.0–2.0)
Bicarbonate: 26.6 mmol/L (ref 20.0–28.0)
Calcium, Ion: 1.2 mmol/L (ref 1.15–1.40)
HCT: 20 % — ABNORMAL LOW (ref 39.0–52.0)
Hemoglobin: 6.8 g/dL — CL (ref 13.0–17.0)
O2 Saturation: 93 %
Patient temperature: 37.4
Potassium: 5 mmol/L (ref 3.5–5.1)
Sodium: 134 mmol/L — ABNORMAL LOW (ref 135–145)
TCO2: 28 mmol/L (ref 22–32)
pCO2 arterial: 45 mmHg (ref 32.0–48.0)
pH, Arterial: 7.381 (ref 7.350–7.450)
pO2, Arterial: 72 mmHg — ABNORMAL LOW (ref 83.0–108.0)

## 2020-10-31 LAB — RENAL FUNCTION PANEL
Albumin: 1.9 g/dL — ABNORMAL LOW (ref 3.5–5.0)
Anion gap: 17 — ABNORMAL HIGH (ref 5–15)
BUN: 52 mg/dL — ABNORMAL HIGH (ref 6–20)
CO2: 26 mmol/L (ref 22–32)
Calcium: 8.9 mg/dL (ref 8.9–10.3)
Chloride: 93 mmol/L — ABNORMAL LOW (ref 98–111)
Creatinine, Ser: 8.19 mg/dL — ABNORMAL HIGH (ref 0.61–1.24)
GFR, Estimated: 7 mL/min — ABNORMAL LOW (ref >60)
Glucose, Bld: 118 mg/dL — ABNORMAL HIGH (ref 70–99)
Phosphorus: 4.9 mg/dL — ABNORMAL HIGH (ref 2.5–4.6)
Potassium: 5.1 mmol/L (ref 3.5–5.1)
Sodium: 136 mmol/L (ref 135–145)

## 2020-10-31 LAB — CULTURE, BLOOD (ROUTINE X 2)
Special Requests: ADEQUATE
Special Requests: ADEQUATE

## 2020-10-31 LAB — HEPARIN LEVEL (UNFRACTIONATED)
Heparin Unfractionated: 0.32 IU/mL (ref 0.30–0.70)
Heparin Unfractionated: 0.36 IU/mL (ref 0.30–0.70)
Heparin Unfractionated: 0.44 IU/mL (ref 0.30–0.70)

## 2020-10-31 LAB — ECHOCARDIOGRAM COMPLETE
Area-P 1/2: 6.96 cm2
Calc EF: 40.2 %
Height: 71 in
MV M vel: 4.36 m/s
MV Peak grad: 75.9 mmHg
Radius: 1 cm
S' Lateral: 3.7 cm
Single Plane A2C EF: 41.5 %
Single Plane A4C EF: 37 %
Weight: 2920.65 oz

## 2020-10-31 LAB — LACTIC ACID, PLASMA: Lactic Acid, Venous: 2.5 mmol/L (ref 0.5–1.9)

## 2020-10-31 LAB — HEPATITIS B SURFACE ANTIBODY, QUANTITATIVE: Hep B S AB Quant (Post): 606.4 m[IU]/mL (ref >9.9)

## 2020-10-31 SURGERY — CARDIOVERSION
Anesthesia: Choice

## 2020-10-31 MED ORDER — ETOMIDATE 2 MG/ML IV SOLN
INTRAVENOUS | Status: AC
  Filled 2020-10-31: qty 20

## 2020-10-31 MED ORDER — FENTANYL CITRATE (PF) 100 MCG/2ML IJ SOLN: 100.0000 ug | Freq: Once | INTRAMUSCULAR | Status: AC

## 2020-10-31 MED ORDER — ORAL CARE MOUTH RINSE
15.0000 mL | OROMUCOSAL | Status: DC
  Administered 2020-10-31 – 2020-11-15 (×134): 15 mL via OROMUCOSAL

## 2020-10-31 MED ORDER — FENTANYL CITRATE (PF) 100 MCG/2ML IJ SOLN
INTRAMUSCULAR | Status: AC
  Administered 2020-10-31: 100 ug
  Filled 2020-10-31: qty 2

## 2020-10-31 MED ORDER — ROCURONIUM BROMIDE 50 MG/5ML IV SOLN
100.0000 mg | Freq: Once | INTRAVENOUS | Status: AC
  Filled 2020-10-31: qty 10

## 2020-10-31 MED ORDER — MAGNESIUM SULFATE IN D5W 1-5 GM/100ML-% IV SOLN
1.0000 g | Freq: Once | INTRAVENOUS | Status: AC
  Administered 2020-10-31: 1 g via INTRAVENOUS
  Filled 2020-10-31: qty 100

## 2020-10-31 MED ORDER — HYDROMORPHONE BOLUS VIA INFUSION
0.5000 mg | INTRAVENOUS | Status: DC | PRN
  Administered 2020-11-01 – 2020-11-07 (×8): 0.5 mg via INTRAVENOUS
  Filled 2020-10-31: qty 1

## 2020-10-31 MED ORDER — SODIUM CHLORIDE 0.9 % IV SOLN: INTRAVENOUS | Status: DC | PRN

## 2020-10-31 MED ORDER — MIDAZOLAM HCL 2 MG/2ML IJ SOLN
2.0000 mg | Freq: Once | INTRAMUSCULAR | Status: AC
  Administered 2020-10-31: 2 mg via INTRAVENOUS

## 2020-10-31 MED ORDER — LIDOCAINE 5 % EX PTCH
1.0000 | MEDICATED_PATCH | CUTANEOUS | Status: DC
  Administered 2020-10-31 – 2020-11-17 (×15): 1 via TRANSDERMAL
  Filled 2020-10-31 (×19): qty 1

## 2020-10-31 MED ORDER — HYDROMORPHONE HCL 1 MG/ML IJ SOLN: 1.0000 mg | INTRAMUSCULAR | Status: DC | PRN

## 2020-10-31 MED ORDER — HYDROMORPHONE HCL 1 MG/ML IJ SOLN
1.0000 mg | Freq: Once | INTRAMUSCULAR | Status: AC
  Administered 2020-10-31: 1 mg via INTRAVENOUS
  Filled 2020-10-31: qty 1

## 2020-10-31 MED ORDER — PROPOFOL 10 MG/ML IV BOLUS
INTRAVENOUS | Status: DC | PRN
  Administered 2020-10-31: 25 mg via INTRAVENOUS

## 2020-10-31 MED ORDER — HYDROMORPHONE HCL 1 MG/ML IJ SOLN
1.0000 mg | INTRAMUSCULAR | Status: DC | PRN
  Administered 2020-11-01: 3 mg via INTRAVENOUS
  Filled 2020-10-31 (×2): qty 3

## 2020-10-31 MED ORDER — ETOMIDATE 2 MG/ML IV SOLN
15.0000 mg | Freq: Once | INTRAVENOUS | Status: AC
  Administered 2020-10-31: 15 mg via INTRAVENOUS

## 2020-10-31 MED ORDER — HYDROMORPHONE HCL 1 MG/ML IJ SOLN
0.5000 mg | INTRAMUSCULAR | Status: DC | PRN
  Administered 2020-10-31 (×2): 0.5 mg via INTRAVENOUS
  Filled 2020-10-31 (×2): qty 0.5

## 2020-10-31 MED ORDER — CHLORHEXIDINE GLUCONATE 0.12% ORAL RINSE (MEDLINE KIT)
15.0000 mL | Freq: Two times a day (BID) | OROMUCOSAL | Status: DC
  Administered 2020-10-31 – 2020-11-15 (×29): 15 mL via OROMUCOSAL

## 2020-10-31 MED ORDER — DEXMEDETOMIDINE HCL IN NACL 400 MCG/100ML IV SOLN
0.4000 ug/kg/h | INTRAVENOUS | Status: DC
  Administered 2020-10-31: 1.2 ug/kg/h via INTRAVENOUS
  Administered 2020-10-31: 0.4 ug/kg/h via INTRAVENOUS
  Administered 2020-11-01: 1 ug/kg/h via INTRAVENOUS
  Filled 2020-10-31 (×3): qty 100

## 2020-10-31 MED ORDER — DOCUSATE SODIUM 50 MG/5ML PO LIQD
100.0000 mg | Freq: Two times a day (BID) | ORAL | Status: DC
  Administered 2020-10-31 – 2020-11-14 (×13): 100 mg
  Filled 2020-10-31 (×15): qty 10

## 2020-10-31 MED ORDER — AMIODARONE HCL 200 MG PO TABS
400.0000 mg | ORAL_TABLET | Freq: Two times a day (BID) | ORAL | Status: DC
  Administered 2020-10-31 – 2020-11-01 (×2): 400 mg via ORAL
  Filled 2020-10-31 (×2): qty 2

## 2020-10-31 MED ORDER — METHOHEXITAL SODIUM 0.5 G IJ SOLR
100.0000 mg | Freq: Once | INTRAMUSCULAR | Status: DC
  Filled 2020-10-31: qty 100

## 2020-10-31 MED ORDER — ROCURONIUM BROMIDE 10 MG/ML (PF) SYRINGE
PREFILLED_SYRINGE | INTRAVENOUS | Status: AC
  Administered 2020-10-31: 100 mg
  Filled 2020-10-31: qty 10

## 2020-10-31 MED ORDER — POLYETHYLENE GLYCOL 3350 17 G PO PACK
17.0000 g | PACK | Freq: Every day | ORAL | Status: DC
  Administered 2020-11-04 – 2020-11-14 (×4): 17 g
  Filled 2020-10-31 (×7): qty 1

## 2020-10-31 MED ORDER — MIDAZOLAM HCL 2 MG/2ML IJ SOLN
INTRAMUSCULAR | Status: AC
  Administered 2020-10-31: 2 mg
  Filled 2020-10-31: qty 4

## 2020-10-31 MED ORDER — SODIUM CHLORIDE 0.9 % IV SOLN
0.5000 mg/h | INTRAVENOUS | Status: DC
  Administered 2020-10-31 – 2020-11-01 (×2): 1 mg/h via INTRAVENOUS
  Administered 2020-11-02 – 2020-11-03 (×2): 2 mg/h via INTRAVENOUS
  Administered 2020-11-04: 1 mg/h via INTRAVENOUS
  Administered 2020-11-06: 4 mg/h via INTRAVENOUS
  Administered 2020-11-07: 1 mg/h via INTRAVENOUS
  Administered 2020-11-08: 2.5 mg/h via INTRAVENOUS
  Administered 2020-11-09: 3 mg/h via INTRAVENOUS
  Filled 2020-10-31 (×12): qty 5

## 2020-10-31 NOTE — Progress Notes (Signed)
[facility] Progress Note   Assessment/ Plan:   OP HD:[facility] MWF 4h 450/800 81.5kg 2/2.25 bath RUE AVF Hep 5000+ 1054midrun - hect 5 ug tiw  - mircera 50 ug q 4, last 11/15 (due 11/29)    Assessment/ Plan: 1. Acute STEMI - SP LAD PCI 11/20 am, has other disease in LCx/ RCA. Per cardiology. 2. SOB/Acute hypoxic RF - had pulm edema by CXR w/ SOB / coughing. Better today, 4 L off Sat afternoon on HD. 1.5L off Monday, next HD planned for Wednesday, UF as BP allows.  CXR with new RML and RLL infiltrate, getting CT chest today. 3. MSSA bacteremia- on cefazolin  4. ESRD - usual HD MWF. Had LHC w/ high LVEDP +35 mmHg. Lowering vol w/ HD as tolerated.  As above.  Please note he has not been converted to our holiday schedule. 5. Shock: presumably septic+/- cardiogenic, pressors per PCCM 6. 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 Afib: on amio and hep gtt, cardiology considering DCCV  Subjective:    Had intermittent HD yesterday as HR improved over the course of the day.  1.5L removed.  Pressor requirement down.     Objective:   BP 100/70   Pulse (!) 124   Temp 98.1 F (36.7 C) (Oral)   Resp (!) 34   Ht 5\' 11"  (1.803 m)   Wt 82.8 kg   SpO2 (!) 89%   BMI 25.46 kg/m   Physical Exam: RJJ:OACZY in bed, appears anxious SAY:TKZSWFUXNAT, no m/r/g Resp: tachypneic Abd: soft Ext: no gross edema ACCESS: LUE AVF + T/B  Labs: BMET Recent Labs  Lab 10/28/20 0604 10/29/20 0101 10/29/20 2121 10/29/20 2128 10/30/20 0110 10/30/20 0548  NA 133* 132* 132* 126* 125* 125*  K 3.7 5.1 3.1* 4.8 4.9 5.2*  CL 86* 88* 99  --   --  86*  CO2 31 28 20*  --   --  22  GLUCOSE 140* 143* 208*  --   --  148*  BUN 30* 33* 48*  --   --  75*  CREATININE 9.73* 7.75* 7.33*  --   --  11.18*  CALCIUM 9.8 9.7 6.5*  --   --  9.1   CBC Recent Labs  Lab 10/28/20 0604 10/29/20 0101 10/29/20 2121 10/29/20 2128 10/30/20 0101  10/30/20 0110 10/30/20 0259 10/31/20 0030  WBC 12.7*   < > 12.9*  --  20.4*  --  20.9* 16.3*  NEUTROABS 11.4*  --  11.8*  --   --   --   --   --   HGB 9.5*   < > 6.0*   < > 8.1* 8.2* 8.0* 8.0*  HCT 29.5*   < > 18.8*   < > 24.6* 24.0* 24.5* 25.1*  MCV 95.5   < > 93.5  --  92.8  --  92.8 93.3  PLT 143*   < > 86*  --  126*  --  119* 128*   < > = values in this interval not displayed.      Medications:    . aspirin  81 mg Oral Daily  . atorvastatin  80 mg Oral q1800  . Chlorhexidine Gluconate Cloth  6 each Topical Q0600  . doxercalciferol  5 mcg Intravenous Q M,W,F-HD  . famotidine  10 mg Oral Daily  . ferric citrate  210 mg Oral With snacks  . ferric citrate  630 mg Oral TID with meals  . mouth rinse  15 mL Mouth Rinse BID  . mupirocin ointment  1 application Nasal BID  . sodium chloride flush  10-40 mL Intracatheter Q12H  . sodium chloride flush  3 mL Intravenous Q12H  . Thrombi-Pad  1 each Topical Once  . ticagrelor  90 mg Oral BID     Madelon Lips MD 10/31/2020, 9:38 AM

## 2020-10-31 NOTE — Progress Notes (Signed)
Called to room by bedside RN due to concerns for impending respiratory distress. Patient was seen with shallow respirations and worsening tachypnea, This coupled with hypoxia and worsening AMS there was concern for inadequate ventilation and oxygenation. Discussed with attending and spouse and the decision was made to electively intubate. Please seen separate procedure note. Will continue ventilatory support obtain CXR and ABG in 1 hr.   Travis Cancel, NP-C [facility] Contact / Pager information can be found on Amion  10/31/2020, 6:13 PM

## 2020-10-31 NOTE — Progress Notes (Signed)
Progress Note  Patient Name: Travis [name] Date of Encounter: 10/31/2020  [facility] Cardiologist: Glenetta Hew, MD   Patient Profile     59 y.o. male with ESRD on IHD, Ascending AoTAA - admitted with  Anterior STEMI 11/21 ->  PCI of the LAD. He has residual disease in his right coronary and AVG-Cx for planned PCI.  PTA was not feeing well for several days - chills & nausea.  On Cath - LVEDP 30-35 mmHg -> had been drinking lots of H20 & in settting of ACS - acute DHF --> developed pulmonary Edema = Acute HD.   Was then febrile PM 11/20-21 - > now 2/2 BC + Staph a (1 reported MSSA).  Overnight 11/21-22 -> went into Afib RVR - became hypotensive - Now in Septic Shock.   Principal Problem:   Acute ST elevation myocardial infarction (STEMI) of anterolateral wall (HCC) Active Problems:   ESRD (end stage renal disease) ([place])   3 V- CAD w/ ACS/STEMI: Culprit = 99% mLAD @ D1 (DES PCI jailing D1); 99% ost-AVGLCx, 80% calcified napkin ring prox RCA.    Presence of drug coated stent in LAD coronary artery: Resolute Onyx DES 2.75 mm x 18 mm (3.1 mm) at major D1 & SP1.   Acute combined systolic and diastolic heart failure (HCC)   Atrial fibrillation with RVR (HCC)   MSSA bacteremia   Septic shock due to Staphylococcus aureus (HCC)   Hyperlipidemia with target LDL less than 70   Essential hypertension   Dyspnea   Subjective   He was a little delirious this morning, has been coughing quite a bit.  Noting right-sided lower chest discomfort worse with moving.  Based on concerns with staff bacteremia, chest x-ray was checked and confirms right lower lobe pneumonia.  PCCM has ordered a chest CT as well.  Oxygenation has been difficult, pulse placed on nonrebreather mask and is maintaining stable oxygenation now.  No anginal pain.  Just dyspneic and coughing. Once placed on nonrebreather and getting good oxygenation, he is clear telling stories about his time in the [employer].  Inpatient  Medications    Scheduled Meds: . aspirin  81 mg Oral Daily  . atorvastatin  80 mg Oral q1800  . Chlorhexidine Gluconate Cloth  6 each Topical Q0600  . doxercalciferol  5 mcg Intravenous Q M,W,F-HD  . famotidine  10 mg Oral Daily  . ferric citrate  210 mg Oral With snacks  . ferric citrate  630 mg Oral TID with meals  . lidocaine  1 patch Transdermal Q24H  . mouth rinse  15 mL Mouth Rinse BID  . methohexital  100 mg Intravenous Once  . mupirocin ointment  1 application Nasal BID  . sodium chloride flush  10-40 mL Intracatheter Q12H  . sodium chloride flush  3 mL Intravenous Q12H  . Thrombi-Pad  1 each Topical Once  . ticagrelor  90 mg Oral BID   Continuous Infusions: . sodium chloride 10 mL/hr at 10/29/20 2300  . sodium chloride Stopped (10/28/20 0954)  . sodium chloride Stopped (10/30/20 1744)  . amiodarone 30 mg/hr (10/31/20 1100)  .  ceFAZolin (ANCEF) IV 1 g (10/30/20 2300)  . heparin 1,600 Units/hr (10/30/20 2006)  . magnesium sulfate bolus IVPB 1 g (10/31/20 1115)  . nitroGLYCERIN Stopped (10/28/20 2100)  . norepinephrine (LEVOPHED) Adult infusion 3 mcg/min (10/31/20 1100)  . phenylephrine (NEO-SYNEPHRINE) Adult infusion Stopped (10/29/20 2237)  . vasopressin 0.03 Units/min (10/31/20 1100)   PRN Meds: sodium  chloride, acetaminophen, HYDROmorphone (DILAUDID) injection, morphine injection, nitroGLYCERIN, ondansetron (ZOFRAN) IV, oxyCODONE, sodium chloride flush, sodium chloride flush   Vital Signs    Vitals:   10/31/20 1030 10/31/20 1045 10/31/20 1100 10/31/20 1115  BP: 111/72 (!) 91/59 (!) 89/65 (!) 87/61  Pulse: (!) 50 (!) 116 (!) 141 (!) 110  Resp: (!) 25 (!) 45 (!) 36 (!) 51  Temp:      TempSrc:      SpO2: 93% (!) 87% 93% 95%  Weight:      Height:        Intake/Output Summary (Last 24 hours) at 10/31/2020 1144 Last data filed at 10/31/2020 1100 Gross per 24 hour  Intake 923.1 ml  Output 1500 ml  Net -576.9 ml   Last 3 Weights 10/29/2020 10/28/2020  10/28/2020  Weight (lbs) 182 lb 8.7 oz 190 lb 7.6 oz 190 lb 7.6 oz  Weight (kg) 82.8 kg 86.4 kg 86.4 kg      Telemetry    Afib RVR 110-130 bpm - Personally Reviewed  ECG    AFib RVR 129 bpm.  LAFB (-45). Evolutionary changes of Anterior STEMI - STE  Still elevated with TWI V2 *V4, TWI I & aVL. - Personally Reviewed  Physical Exam   GEN:  Initially somewhat anxious, jittery and somewhat under distress, now on nonrebreather is resting comfortably.  Still having to work hard to breathing.  Does have significant pain in the right lower chest/back.   Neck:  He does have mild JVD but not as dramatic as yesterday.  Still clinically worse. Cardiac:  Tachycardic with irregularly regular rhythm.,  Unable to assess for any true/R/G Respiratory:  Notably different from yesterday he was coarse rhonchorous breath sounds in the right base.  No rales, wheezing GI:  Mains mildly tender to palpation but otherwise soft/NT/ND/NABS. MS:  No clubbing cyanosis or edema.  Left femoral venous line in place.  Right femoral arterial access stable.  No hematoma. Neuro:   Easily agitated and was delirious this morning.  Better with oxygenation. Psych: Anxious, but normal affect.  Labs    High Sensitivity Troponin:   Recent Labs  Lab 10/28/20 0629 10/28/20 1046 10/28/20 2040 10/29/20 2121 10/30/20 0548  TROPONINIHS 3,155* >27,000* >27,000* >27,000* >27,000*      Chemistry Recent Labs  Lab 10/28/20 0604 10/29/20 0101 10/29/20 2121 10/29/20 2128 10/30/20 0110 10/30/20 0548 10/31/20 0830  NA 133*   < > 132*   < > 125* 125* 136  K 3.7   < > 3.1*   < > 4.9 5.2* 5.1  CL 86*   < > 99  --   --  86* 93*  CO2 31   < > 20*  --   --  22 26  GLUCOSE 140*   < > 208*  --   --  148* 118*  BUN 30*   < > 48*  --   --  75* 52*  CREATININE 9.73*   < > 7.33*  --   --  11.18* 8.19*  CALCIUM 9.8   < > 6.5*  --   --  9.1 8.9  PROT 6.6  --   --   --   --  5.6*  --   ALBUMIN 3.1*  --   --   --   --  2.2* 1.9*  AST  84*  --   --   --   --  87*  --   ALT 87*  --   --   --   --  51*  --   ALKPHOS 146*  --   --   --   --  95  --   BILITOT 1.2  --   --   --   --  1.5*  --   GFRNONAA 6*   < > 8*  --   --  5* 7*  ANIONGAP 16*   < > 13  --   --  17* 17*   < > = values in this interval not displayed.     Hematology Recent Labs  Lab 10/30/20 0101 10/30/20 0101 10/30/20 0110 10/30/20 0259 10/31/20 0030  WBC 20.4*  --   --  20.9* 16.3*  RBC 2.65*  --   --  2.64* 2.69*  HGB 8.1*   < > 8.2* 8.0* 8.0*  HCT 24.6*   < > 24.0* 24.5* 25.1*  MCV 92.8  --   --  92.8 93.3  MCH 30.6  --   --  30.3 29.7  MCHC 32.9  --   --  32.7 31.9  RDW 14.6  --   --  14.4 14.6  PLT 126*  --   --  119* 128*   < > = values in this interval not displayed.    BNPNo results for input(s): BNP, PROBNP in the last 168 hours.   DDimer No results for input(s): DDIMER in the last 168 hours.   Radiology    CT CHEST WO CONTRAST  Result Date: 10/31/2020 CLINICAL DATA:  Chest pain and increased shortness of breath. EXAM: CT CHEST WITHOUT CONTRAST TECHNIQUE: Multidetector CT imaging of the chest was performed following the standard protocol without IV contrast. COMPARISON:  None. FINDINGS: Cardiovascular: The heart size is mildly increased. No substantial pericardial effusion. Coronary artery calcification is evident. Atherosclerotic calcification is noted in the wall of the thoracic aorta. Mediastinum/Nodes: Scattered upper normal mediastinal lymph nodes evident. No evidence for gross hilar lymphadenopathy although assessment is limited by the lack of intravenous contrast on today's study. The esophagus has normal imaging features. There is no axillary lymphadenopathy. Lungs/Pleura: Extensive multifocal nodular and patchy airspace disease is seen in both lungs. Upper lung disease has a slight peripheral predominance and a 3.1 cm nodular consolidative opacity in the right upper lobe is cavitated (36/4). Confluent airspace disease is seen in  the posterior right middle lobe with air bronchograms and relatively diffuse involvement noted right lower lobe as well. No pleural effusion. Upper Abdomen: 17 mm left adrenal nodule cannot be characterized. Liver parenchyma is heterogeneous Musculoskeletal: No worrisome lytic or sclerotic osseous abnormality. Bony mineralization suggests chronic renal disease. IMPRESSION: 1. Extensive multifocal nodular and patchy airspace disease in both lungs with a slight peripheral predominance in the upper lungs. 3.1 cm nodular consolidative opacity in the right upper lobe is cavitated. Imaging features likely related to multifocal pneumonia. Septic emboli and metastatic disease considered less likely but not excluded. 2. 17 mm left adrenal nodule cannot be characterized. MRI abdomen with and without contrast could be used to further evaluate as clinically warranted. 3. Aortic Atherosclerosis (ICD10-I70.0). Electronically Signed   By: Misty Stanley M.D.   On: 10/31/2020 11:32   DG CHEST PORT 1 VIEW  Result Date: 10/31/2020 CLINICAL DATA:  Right-sided chest pain and shortness of breath EXAM: PORTABLE CHEST 1 VIEW COMPARISON:  10/28/2020 FINDINGS: Cardiac shadow is enlarged but accentuated by the portable technique. New patchy infiltrate in the right mid and lower lung is noted consistent with acute pneumonia. This was not seen on the  prior exam. No acute rib abnormality is noted. IMPRESSION: New patchy right mid and lower lung infiltrate. Electronically Signed   By: Inez Catalina M.D.   On: 10/31/2020 09:41   VAS US DUPLEX DIALYSIS ACCESS (AVF, AVG)  Result Date: 10/30/2020 DIALYSIS ACCESS Reason for Exam: Bacteremia. Access Site: Left Upper Extremity. Access Type: Basilic vein transposition. History: Fever, rule out abscess. Left AVF pseudoaneurysm repair 12/22/2017. Limitations: bandages, positioning, patient cooperation, patient receiving              bedside hemodialysis during examination. Comparison Study: No  prior study Performing Technologist: Sharion Dove RVS Supporting Technologist: Maudry Mayhew MHA, RDMS, RVT, RDCS  Examination Guidelines: A complete evaluation includes B-mode imaging, spectral Doppler, color Doppler, and power Doppler as needed of all accessible portions of each vessel. Unilateral testing is considered an integral part of a complete examination. Limited examinations for reoccurring indications may be performed as noted.  Findings: +--------------------+----------+-----------------+--------+ AVF                 PSV (cm/s)Flow Vol (mL/min)Comments +--------------------+----------+-----------------+--------+ Native artery inflow   125          1816                +--------------------+----------+-----------------+--------+ AVF Anastomosis        132                              +--------------------+----------+-----------------+--------+  +------------+----------+-------------+----------+----------+ OUTFLOW VEINPSV (cm/s)Diameter (cm)Depth (cm) Describe  +------------+----------+-------------+----------+----------+ Prox UA        218        0.74                          +------------+----------+-------------+----------+----------+ Dist UA                   2.40               aneurysmal +------------+----------+-------------+----------+----------+ AC Fossa       372        0.78                          +------------+----------+-------------+----------+----------+   Summary: Arteriovenous fistula-Aneurysmal dilatation noted.  *See table(s) above for measurements and observations.   --------------------------------------------------------------------------------   Preliminary     Cardiac Studies    Cath PCI 11/20: SUMMARY  Acute anterolateral ST elevation MI with Culprit lesion being the 99% mid LAD lesion (just past major 1st Diag/D1) along with multivessel CAD including 99% ostial AV groove LCx and 80% calcified napkin ring proximal RCA lesion  with extensive calcification throughout the RCA. ? Successful PTCA and DES PCI of the LAD crossing D1 -resolute Onyx DES 2.75 mm x 18 mm postdilated to 3.1 mm. ? With PCI, there was stabilization of significant ectopy, AIVR and PVCs  Mild to moderate reduced EF with anterior anterolateral hypokinesis, EF roughly 40 to 45%.  Initial evaluation suggested normal EDP, but on recheck post PCI LVEDP of 30 mmHg, severely elevated consistent with ACUTE DIASTOLIC HEART FAILURE  Significantly dilated aortic root requiring AL-1 guide catheter for both Left and Right Coronary Angiography.   RECOMMENDATIONS  Admit to CCU, restart IV heparin 8 hours after sheath removal  Check 2D echo for better assessment of EF  Plan to review cath films with other IC cardiologist, anticipate staged PCI of at least the RCA  which may require atherectomy versus lithotripsy, and likely the ostial AV groove circumflex.  If he has signs of dyspnea, consider earlier dialysis than tomorrow. ->  Would consult for nephrology to see today.  He is already on labetalol and Norvasc.  We will continue current home medications.  Uninterrupted DAPT times X 1 year.   Echo currently pending - but will wait until out of RVR. Bedside Echo yesterday   LFemV Central [facility] by [facility] 11/21   Assessment & Plan    Principal Problem:   Acute ST elevation myocardial infarction (STEMI) of anterolateral wall (HCC) Active Problems:   ESRD (end stage renal disease) ([place])   3 V- CAD w/ ACS/STEMI: Culprit = 99% mLAD @ D1 (DES PCI jailing D1); 99% ost-AVGLCx, 80% calcified napkin ring prox RCA.    Presence of drug coated stent in LAD coronary artery: Resolute Onyx DES 2.75 mm x 18 mm (3.1 mm) at major D1 & SP1.   Acute combined systolic and diastolic heart failure (HCC)   Atrial fibrillation with RVR (HCC)   MSSA bacteremia   Septic shock due to Staphylococcus aureus (HCC)   Hyperlipidemia with target LDL less than 70   Essential  hypertension   Dyspnea  Principal Problem:   Acute ST elevation myocardial infarction (STEMI) of anterolateral wall (HCC) /  3 V- CAD w/ ACS/STEMI: Culprit = 99% mLAD @ D1 (DES PCI jailing D1); 99% ost-AVGLCx, 80% calcified napkin ring prox RCA. /  Presence of drug coated stent in LAD coronary artery: Resolute Onyx DES 2.75 mm x 18 mm (3.1 mm) at major D1 & SP1.  PCI of LAD- with planned staged PCI of RCA & AVG Cx-  Will have to put on hold given MSSA bacteremia  Continue IV Heparin for CAD & Afib (although with dropping Hgb - monitor for bleeding)  High dose statin  BB& CCB on hold for Septic Shock  DAPT for now with ASA/Brilinta -- but will change to Plavix/ASA/& Warfarin (would probably not d/c on ASA)    End stage renal disease on dialysis ([place]) - on HD M-W-F (required urgent cath on 11/20 PM 2/2 volume overload & Acute DHF- 4 L off).  Appreciate Nephrology prompt attention this weekend & continued assistance  Actually had dialysis yesterday as opposed to be delayed.    Acute combined systolic and diastolic heart failure (HCC) (complicated with Volume overload & accelerated HTN) --> resolved with HD on Saturday post Cath.   Remains on low-dose pressors.  Unable to initiate BP meds.  Volume control with HD   We will try to restore sinus rhythm which will allow [name] to then check an echocardiogram.  Currently     Atrial fibrillation with RVR ([place]) - initially had urgent DCCV; initially back to SR shortly & retured to Afib RVR currently in 110-120s  Remains on amiodarone infusion. ->  If we are able to successfully cardiovert, will convert to oral 40 mg twice daily  He is n.p.o. with plans for PCCM or myself to perform cardioversion once we have anesthesiology present for sedation purposes.  Currently on amiodarone infusion - will rebolus 150 mg & continue IV for now.  Will need to initiate warfarin prior to d/c - would then convert Brilinta to Plavix (stop aspirin once on  warfarin to avoid through triple medication.    Bacteremia due to Staphylococcus - MSSA; Septic shock due to Staphylococcus aureus ([place]) - PCCM consult & ID counsulted -> WBC elevated Febrile to 102.5  on 11/21 PM - current T max 99.8   2/2 BC + for MSSA = started on Vanc-Zosyn -remains on cefazolin.  Now with staph pneumonia, will have at least 4 weeks of antibiotics.  Remains on low-dose Levophed and vasopressin.  Once cardioverted, we will do TTE, and once more stable from a hemodynamic and oxygenation standpoint, can consider TEE..   Will defer management of Abx to PCCM/ID.  Chest x-ray now shows clear evidence of right lower lobe pneumonia.  Concerning for "metastatic staph bacteremia"    Hyperlipidemia with target LDL less than 70:  High dose statin started.      Essential hypertension -remains on pressors.    Anemia - Hgb 9.5 on admission, currently stable at 8.  Does not appear to be bleeding.; on ESA per Nephrology  Monitor - may need 1 unit to help stabilize BP  Consider 1 unit PRBC with dialysis tomorrow.   I discussed with Dr. Lynetta Mare, either he or myself will be present for cardioversion.   Patient is very complex patient with multisystem failure-CAD, A. fib, CHF, sepsis with ESRD on HD and anemia. Critical care time: 35 minutes with patient, 25 minutes per chart. Total 1:0 hour.   For questions or updates, please contact [place] Please consult [email or web address] for contact info under        Signed, Glenetta Hew, MD  10/31/2020, 11:44 AM

## 2020-10-31 NOTE — CV Procedure (Signed)
° ° ° ° °  NAME:  Travis [name]   MRN: [id number] DOB:  Apr 17, 1961   ADMIT DATE: 10/28/2020   Procedure: Electrical Cardioversion Indications:  Atrial Fibrillation  Procedure Details:  Consent: Risks of procedure as well as the alternatives and risks of each were explained to the (patient/caregiver).  Consent for procedure obtained.  Time Out: Verified patient identification, verified procedure, site/side was marked, verified correct patient position, special equipment/implants available, medications/allergies/relevent history reviewed, required imaging and test results available.  Performed   Anesthesia: Dr. Ermalene Postin Patient placed on cardiac monitor, pulse oximetry, supplemental oxygen as necessary.  Sedation given: Propofol 25 mg  --  Pacer pads placed anterior and posterior chest.  Cardioverted 1 time(s).  Cardioverted at [place].  Evaluation: Findings: Post procedure EKG shows: NSR Complications: took a little time to wake up - otw - did well. Patient did tolerate procedure well.  Time Spent Directly with the Patient:  15 minutes

## 2020-10-31 NOTE — Progress Notes (Signed)
Patient oxygen was dropping from the high 80's to high 70's. Increased confusion, agitation, and using accessory muscles to breathe. MD was notified. NP came to bedside. Intubation was decided.    OG tube placement was tried multiple times and by multiple people. Unsuccessful. NG was tried and unsuccessful.

## 2020-10-31 NOTE — Progress Notes (Signed)
[place] for Heparin Indication: chest pain/ACS  Allergies  Allergen Reactions  . Ibuprofen Hives  . Lisinopril Swelling    PT states he is allergic to all prils; caused facial swelling  . Naproxen Hives and Other (See Comments)    Alleve causes patient to have hives    Patient Measurements: Height: 5\' 11"  (180.3 cm) Weight: 82.8 kg (182 lb 8.7 oz) IBW/kg (Calculated) : 75.3 Heparin Dosing Weight: 86.4 kg   Vital Signs: Temp: 99.8 F (37.7 C) (11/23 0320) Temp Source: Oral (11/23 0320) BP: 102/86 (11/23 0500) Pulse Rate: 133 (11/23 0500)  Labs: Recent Labs     0000 10/28/20 2040 10/29/20 0101 10/29/20 1432 10/29/20 2121 10/29/20 2128 10/30/20 0101 10/30/20 0101 10/30/20 0110 10/30/20 0259 10/30/20 0259 10/30/20 0548 10/30/20 1250 10/31/20 0000 10/31/20 0030 10/31/20 0448  HGB   < >  --  9.9*   < > 6.0*   < > 8.1*   < > 8.2* 8.0*  --   --   --   --  8.0*  --   HCT   < >  --  31.1*  --  18.8*   < > 24.6*   < > 24.0* 24.5*  --   --   --   --  25.1*  --   PLT   < >  --  121*  --  86*   < > 126*  --   --  119*  --   --   --   --  128*  --   LABPROT  --   --   --   --   --   --   --   --   --   --   --  15.0  --   --   --   --   INR  --   --   --   --   --   --   --   --   --   --   --  1.2  --   --   --   --   HEPARINUNFRC  --   --   --    < >  --   --   --   --   --  0.10*   < >  --  0.15* 0.32  --  0.36  CREATININE  --   --  7.75*  --  7.33*  --   --   --   --   --   --  11.18*  --   --   --   --   TROPONINIHS  --  >27,000*  --   --  >27,000*  --   --   --   --   --   --  >27,000*  --   --   --   --    < > = values in this interval not displayed.    Estimated Creatinine Clearance: 7.7 mL/min (A) (by C-G formula based on SCr of 11.18 mg/dL (H)).    Assessment: 5 yom presenting with STEMI. Known to have dilated ascending aorta, ESRD. Found to have 99% mid LAD with multivessel in LCx and RCA, now s/p DES to LAD. Heparin  was started post sheath removal. Patient was found to have bacteremia and will not proceed with cath intervention at this time. Patient has developed Afib overnight and is on an amio drip. Hgb stable at 8.0 and  Plts low stable at 128. Heparin level this am is therapeutic at 0.36.    Goal of Therapy:  Heparin level 0.3-0.7 units/ml Monitor platelets by anticoagulation protocol: Yes   Plan:  Continue heparin at 1600 units/hr Daily HL and CBC  Monitor for s/sx of bleed and plan for continued [place], PharmD, [place] Resident [phone] 10/31/2020 7:05 AM

## 2020-10-31 NOTE — Progress Notes (Signed)
[facility] Infectious Disease  Date of Admission:  10/28/2020      Lines:  11/21-c left groin cvc Peripheral iv's  Abx: 11/22-c cefazolin 1 gram qhs 11/21 vanc/piptazo                                                                Assessment: 59 yo male no ivdu history, pmh htn, esrd on iHD, cad,  Admitted with sepsis and anterior wall stemi, s/p emergent PCI, found to have septic shock post procedure and mssa bacteremia  Patient reports fever/chill 3 days prior to admission, then on the day of admission chest pain. This suggest sepsis/BSI likely from dialysis source with resultant stemi  Would call this complicated mssa BSI and will need TEE to r/o endocarditis  11/21 bcx positive; 11/22 pending 11/22 tte is in process.   No other hardware. The fistula is native. There is reported infection of coronary stent but rare  11/23 Improving wbc; afebrile Afib/rvr with unstable hemodynamics; pending cardioversion Discussed with cardiology need for tee for mssa workup and dr [name] agrees but will defer until patient more stable  Plan: 1. F/u tte; will consider tee once his hemodynamics is a little better 2. F/u repeat bcx  3. Continue renal dosed cefazolin  Principal Problem:   Acute ST elevation myocardial infarction (STEMI) of anterolateral wall (HCC) Active Problems:   ESRD (end stage renal disease) (HCC)   Essential hypertension   Hyperlipidemia with target LDL less than 70   3 V- CAD w/ ACS/STEMI: Culprit = 99% mLAD @ D1 (DES PCI jailing D1); 99% ost-AVGLCx, 80% calcified napkin ring prox RCA.    Presence of drug coated stent in LAD coronary artery: Resolute Onyx DES 2.75 mm x 18 mm (3.1 mm) at major D1 & SP1.   Acute combined systolic and diastolic heart failure (HCC)   Atrial fibrillation with RVR (HCC)   MSSA bacteremia   Septic shock due to Staphylococcus aureus (HCC)   Dyspnea   Scheduled Meds: . amiodarone  400 mg Oral BID  .  aspirin  81 mg Oral Daily  . atorvastatin  80 mg Oral q1800  . Chlorhexidine Gluconate Cloth  6 each Topical Q0600  . doxercalciferol  5 mcg Intravenous Q M,W,F-HD  . famotidine  10 mg Oral Daily  . ferric citrate  210 mg Oral With snacks  . ferric citrate  630 mg Oral TID with meals  . lidocaine  1 patch Transdermal Q24H  . mouth rinse  15 mL Mouth Rinse BID  . mupirocin ointment  1 application Nasal BID  . sodium chloride flush  10-40 mL Intracatheter Q12H  . sodium chloride flush  3 mL Intravenous Q12H  . Thrombi-Pad  1 each Topical Once  . ticagrelor  90 mg Oral BID   Continuous Infusions: . sodium chloride 10 mL/hr at 10/29/20 2300  . sodium chloride Stopped (10/28/20 0954)  . sodium chloride Stopped (10/30/20 1744)  . amiodarone 30 mg/hr (10/31/20 1300)  .  ceFAZolin (ANCEF) IV 1 g (10/30/20 2300)  . heparin 1,600 Units/hr (10/31/20 1246)  . nitroGLYCERIN Stopped (10/28/20 2100)  . norepinephrine (LEVOPHED) Adult infusion 15 mcg/min (10/31/20 1300)  . phenylephrine (NEO-SYNEPHRINE) Adult infusion Stopped (10/29/20 2237)  .  vasopressin 0.03 Units/min (10/31/20 1300)   PRN Meds:.sodium chloride, acetaminophen, HYDROmorphone (DILAUDID) injection, nitroGLYCERIN, ondansetron (ZOFRAN) IV, oxyCODONE, sodium chloride flush, sodium chloride flush   SUBJECTIVE: afib rvr with low bp; on a touch of levophed Pending tte bcx repeat in progress Afebrile Wbc decreasing Symptomatically remains the same as yesterday; no focal pain, n/v/diarrhea, rash  Review of Systems: ROS Negative 11 point ros unless mentioned above  Allergies  Allergen Reactions  . Ibuprofen Hives  . Lisinopril Swelling    PT states he is allergic to all prils; caused facial swelling  . Naproxen Hives and Other (See Comments)    Alleve causes patient to have hives    OBJECTIVE: Vitals:   10/31/20 1255 10/31/20 1300 10/31/20 1315 10/31/20 1330  BP: 104/72 97/72 107/77 112/74  Pulse: 95 94 96 97  Resp:  (!) 23 (!) 21 (!) 27 (!) 27  Temp:      TempSrc:      SpO2: (!) 86% (!) 88% 94% (!) 87%  Weight:      Height:       Body mass index is 25.46 kg/m.  Physical Exam In face mask, a little anxious, conversant Heent: Normocephalic; conj clear; per; eomi Cv: tachy regular, no mrg Lungs: clear; normal respiratory effort Abd: s/nt Ext: no edema Neuro nonfocal Psych: alert/oriented  Lab Results Lab Results  Component Value Date   WBC 16.3 (H) 10/31/2020   HGB 8.0 (L) 10/31/2020   HCT 25.1 (L) 10/31/2020   MCV 93.3 10/31/2020   PLT 128 (L) 10/31/2020    Lab Results  Component Value Date   CREATININE 8.19 (H) 10/31/2020   BUN 52 (H) 10/31/2020   NA 136 10/31/2020   K 5.1 10/31/2020   CL 93 (L) 10/31/2020   CO2 26 10/31/2020    Lab Results  Component Value Date   ALT 51 (H) 10/30/2020   AST 87 (H) 10/30/2020   ALKPHOS 95 10/30/2020   BILITOT 1.5 (H) 10/30/2020     Microbiology: Recent Results (from the past 240 hour(s))  Respiratory Panel by RT PCR (Flu A&B, Covid) - Nasopharyngeal Swab     Status: None   Collection Time: 10/28/20  6:16 AM   Specimen: Nasopharyngeal Swab; Nasopharyngeal(NP) swabs in vial transport medium  Result Value Ref Range Status   SARS Coronavirus 2 by RT PCR NEGATIVE NEGATIVE Final    Comment: (NOTE) SARS-CoV-2 target nucleic acids are NOT DETECTED.  The SARS-CoV-2 RNA is generally detectable in upper respiratoy specimens during the acute phase of infection. The lowest concentration of SARS-CoV-2 viral copies this assay can detect is 131 copies/mL. A negative result does not preclude SARS-Cov-2 infection and should not be used as the sole basis for treatment or other patient management decisions. A negative result may occur with  improper specimen collection/handling, submission of specimen other than nasopharyngeal swab, presence of viral mutation(s) within the areas targeted by this assay, and inadequate number of viral copies (<131  copies/mL). A negative result must be combined with clinical observations, patient history, and epidemiological information. The expected result is Negative.  Fact Sheet for Patients:  PinkCheek.be  Fact Sheet for Healthcare Providers:  GravelBags.it  This test is no t yet approved or cleared by the [place] FDA and  has been authorized for detection and/or diagnosis of SARS-CoV-2 by FDA under an Emergency Use Authorization (EUA). This EUA will remain  in effect (meaning this test can be used) for the duration of the COVID-19 declaration under Section  564(b)(1) of the Act, 21 U.S.C. section 360bbb-3(b)(1), unless the authorization is terminated or revoked sooner.     Influenza A by PCR NEGATIVE NEGATIVE Final   Influenza B by PCR NEGATIVE NEGATIVE Final    Comment: (NOTE) The Xpert Xpress SARS-CoV-2/FLU/RSV assay is intended as an aid in  the diagnosis of influenza from Nasopharyngeal swab specimens and  should not be used as a sole basis for treatment. Nasal washings and  aspirates are unacceptable for Xpert Xpress SARS-CoV-2/FLU/RSV  testing.  Fact Sheet for Patients: PinkCheek.be  Fact Sheet for Healthcare Providers: GravelBags.it  This test is not yet approved or cleared by the [place] FDA and  has been authorized for detection and/or diagnosis of SARS-CoV-2 by  FDA under an Emergency Use Authorization (EUA). This EUA will remain  in effect (meaning this test can be used) for the duration of the  Covid-19 declaration under Section 564(b)(1) of the Act, 21  U.S.C. section 360bbb-3(b)(1), unless the authorization is  terminated or revoked. Performed at [facility], [place] [address] [place], [place] [address]   Surgical PCR screen     Status: Abnormal   Collection Time: 10/28/20  6:10 PM   Specimen: Nasal Mucosa; Nasal Swab  Result Value  Ref Range Status   MRSA, PCR NEGATIVE NEGATIVE Final   Staphylococcus aureus POSITIVE (A) NEGATIVE Final    Comment: (NOTE) The Xpert SA Assay (FDA approved for NASAL specimens in patients 3 years of age and older), is one component of a comprehensive surveillance program. It is not intended to diagnose infection nor to guide or monitor treatment. Performed at [facility], [place] [address] [place], [place] [address]   MRSA PCR Screening     Status: None   Collection Time: 10/28/20 10:10 PM   Specimen: Nasal Mucosa; Nasopharyngeal  Result Value Ref Range Status   MRSA by PCR NEGATIVE NEGATIVE Final    Comment:        The GeneXpert MRSA Assay (FDA approved for NASAL specimens only), is one component of a comprehensive MRSA colonization surveillance program. It is not intended to diagnose MRSA infection nor to guide or monitor treatment for MRSA infections. Performed at [facility], [place] [address], [place], [place] [address]   Culture, blood (Routine X 2) w Reflex to ID Panel     Status: Abnormal   Collection Time: 10/29/20  8:30 AM   Specimen: BLOOD LEFT HAND  Result Value Ref Range Status   Specimen Description BLOOD LEFT HAND  Final   Special Requests   Final    BOTTLES DRAWN AEROBIC AND ANAEROBIC Blood Culture adequate volume   Culture  Setup Time   Final    GRAM POSITIVE COCCI IN CLUSTERS IN BOTH AEROBIC AND ANAEROBIC BOTTLES CRITICAL RESULT CALLED TO, READ BACK BY AND VERIFIED WITH: M. BITONTI,PHARMD 2138 10/29/2020 Mena Goes Performed at [facility], [place] [address] [place],  [address]    Culture STAPHYLOCOCCUS AUREUS (A)  Final   Report Status 10/31/2020 FINAL  Final   Organism ID, Bacteria STAPHYLOCOCCUS AUREUS  Final      Susceptibility   Staphylococcus aureus - MIC*    CIPROFLOXACIN <=0.5 SENSITIVE Sensitive     ERYTHROMYCIN <=0.25 SENSITIVE Sensitive     GENTAMICIN <=0.5 SENSITIVE Sensitive     OXACILLIN 0.5 SENSITIVE Sensitive      TETRACYCLINE <=1 SENSITIVE Sensitive     VANCOMYCIN 1 SENSITIVE Sensitive     TRIMETH/SULFA <=10 SENSITIVE  Sensitive     CLINDAMYCIN <=0.25 SENSITIVE Sensitive     RIFAMPIN <=0.5 SENSITIVE Sensitive     Inducible Clindamycin NEGATIVE Sensitive     * STAPHYLOCOCCUS AUREUS  Blood Culture ID Panel (Reflexed)     Status: Abnormal   Collection Time: 10/29/20  8:30 AM  Result Value Ref Range Status   Enterococcus faecalis NOT DETECTED NOT DETECTED Final   Enterococcus Faecium NOT DETECTED NOT DETECTED Final   Listeria monocytogenes NOT DETECTED NOT DETECTED Final   Staphylococcus species DETECTED (A) NOT DETECTED Final    Comment: CRITICAL RESULT CALLED TO, READ BACK BY AND VERIFIED WITH: M. BITONTI,PHARMD 2138 10/29/2020 T. TYSOR    Staphylococcus aureus (BCID) DETECTED (A) NOT DETECTED Final    Comment: CRITICAL RESULT CALLED TO, READ BACK BY AND VERIFIED WITH: M. BITONTI,PHARMD 2138 10/29/2020 T. TYSOR    Staphylococcus epidermidis NOT DETECTED NOT DETECTED Final   Staphylococcus lugdunensis NOT DETECTED NOT DETECTED Final   Streptococcus species NOT DETECTED NOT DETECTED Final   Streptococcus agalactiae NOT DETECTED NOT DETECTED Final   Streptococcus pneumoniae NOT DETECTED NOT DETECTED Final   Streptococcus pyogenes NOT DETECTED NOT DETECTED Final   A.calcoaceticus-baumannii NOT DETECTED NOT DETECTED Final   Bacteroides fragilis NOT DETECTED NOT DETECTED Final   Enterobacterales NOT DETECTED NOT DETECTED Final   Enterobacter cloacae complex NOT DETECTED NOT DETECTED Final   Escherichia coli NOT DETECTED NOT DETECTED Final   Klebsiella aerogenes NOT DETECTED NOT DETECTED Final   Klebsiella oxytoca NOT DETECTED NOT DETECTED Final   Klebsiella pneumoniae NOT DETECTED NOT DETECTED Final   Proteus species NOT DETECTED NOT DETECTED Final   Salmonella species NOT DETECTED NOT DETECTED Final   Serratia marcescens NOT DETECTED NOT DETECTED Final   Haemophilus influenzae NOT DETECTED NOT  DETECTED Final   Neisseria meningitidis NOT DETECTED NOT DETECTED Final   Pseudomonas aeruginosa NOT DETECTED NOT DETECTED Final   Stenotrophomonas maltophilia NOT DETECTED NOT DETECTED Final   Candida albicans NOT DETECTED NOT DETECTED Final   Candida auris NOT DETECTED NOT DETECTED Final   Candida glabrata NOT DETECTED NOT DETECTED Final   Candida krusei NOT DETECTED NOT DETECTED Final   Candida parapsilosis NOT DETECTED NOT DETECTED Final   Candida tropicalis NOT DETECTED NOT DETECTED Final   Cryptococcus neoformans/gattii NOT DETECTED NOT DETECTED Final   Meth resistant mecA/C and MREJ NOT DETECTED NOT DETECTED Final    Comment: Performed at [facility], [address] [place], [place] [address]  Culture, blood (Routine X 2) w Reflex to ID Panel     Status: Abnormal   Collection Time: 10/29/20  8:45 AM   Specimen: BLOOD  Result Value Ref Range Status   Specimen Description BLOOD LEFT ANTECUBITAL  Final   Special Requests   Final    BOTTLES DRAWN AEROBIC AND ANAEROBIC Blood Culture adequate volume   Culture  Setup Time   Final    GRAM POSITIVE COCCI IN CLUSTERS IN BOTH AEROBIC AND ANAEROBIC BOTTLES CRITICAL RESULT CALLED TO, READ BACK BY AND VERIFIED WITH: M. BITONTI,PHARMD 2138 10/29/2020 T. TYSOR    Culture (A)  Final    STAPHYLOCOCCUS AUREUS SUSCEPTIBILITIES PERFORMED ON PREVIOUS CULTURE WITHIN THE LAST 5 DAYS. Performed at [facility], [place] [address] [place], [place] [address]    Report Status 10/31/2020 FINAL  Final  Culture, blood (routine x 2)     Status: None (Preliminary result)   Collection Time: 10/30/20  6:51 PM   Specimen: BLOOD  Result Value Ref Range Status   Specimen Description BLOOD BLOOD RIGHT FOREARM  Final   Special Requests   Final    BOTTLES DRAWN AEROBIC ONLY Blood Culture results may not be optimal due to an inadequate volume of blood received in culture bottles   Culture   Final    NO GROWTH < 12 HOURS Performed at [facility] [address], [place], [place] [address]    Report Status PENDING  Incomplete  Culture, blood (routine x 2)     Status: None (Preliminary result)   Collection Time: 10/30/20  6:57 PM   Specimen: BLOOD RIGHT HAND  Result Value Ref Range Status   Specimen Description BLOOD RIGHT HAND  Final   Special Requests   Final    BOTTLES DRAWN AEROBIC ONLY Blood Culture results may not be optimal due to an inadequate volume of blood received in culture bottles   Culture   Final    NO GROWTH < 12 HOURS Performed at [facility], [place] [address] [place], [place] [address]    Report Status PENDING  Incomplete    Serology: n/a   Jabier Mutton, [place] for [place] [phone] pager    10/31/2020, 2:01 PM

## 2020-10-31 NOTE — Procedures (Signed)
Intubation Procedure Note  ANITA MCADORY  [id number]  Jun 03, 1961  Date:10/31/20  Time:6:14 PM   Provider Performing:Billiejo Sorto F Rosana Hoes    Procedure: Intubation ([address])  Indication(s) Respiratory Failure  Consent Risks of the procedure as well as the alternatives and risks of each were explained to the patient and/or caregiver.  Consent for the procedure was obtained and is signed in the bedside chart   Anesthesia Etomidate and Versed   Time Out Verified patient identification, verified procedure, site/side was marked, verified correct patient position, special equipment/implants available, medications/allergies/relevant history reviewed, required imaging and test results available.   Sterile Technique Usual hand hygeine, masks, and gloves were used   Procedure Description Patient positioned in bed supine.  Sedation given as noted above.  Patient was intubated with endotracheal tube using Glidescope.  View was Grade 1 full glottis .  Number of attempts was 1.  Colorimetric CO2 detector was consistent with tracheal placement.   Complications/Tolerance None; patient tolerated the procedure well. Chest X-ray is ordered to verify placement.   EBL Minimal   Specimen(s) None  Johnsie Cancel, NP-C [facility] Contact / Pager information can be found on Amion  10/31/2020, 6:14 PM

## 2020-10-31 NOTE — Transfer of Care (Signed)
Immediate Anesthesia Transfer of Care Note  Patient: Travis [name]  Procedure(s) Performed: AN AD [place]  Patient Location: ICU  Anesthesia Type:General  Level of Consciousness: awake, alert  and oriented  Airway & Oxygen Therapy: Patient Spontanous Breathing and Patient connected to face mask oxygen  Post-op Assessment: Report given to RN and Post -op Vital signs reviewed and stable  Post vital signs: Reviewed and stable  Last Vitals:  Vitals Value Taken Time  BP 93/70 10/31/20 1232  Temp    Pulse 95 10/31/20 1234  Resp 41 10/31/20 1234  SpO2 89 % 10/31/20 1234  Vitals shown include unvalidated device data.  Last Pain:  Vitals:   10/31/20 1027  TempSrc:   PainSc: Asleep      Patients Stated Pain Goal: 2 (54/36/06 7703)  Complications: No complications documented.

## 2020-10-31 NOTE — Progress Notes (Signed)
[facility] for Heparin Indication: chest pain/ACS  Allergies  Allergen Reactions  . Ibuprofen Hives  . Lisinopril Swelling    PT states he is allergic to all prils; caused facial swelling  . Naproxen Hives and Other (See Comments)    Alleve causes patient to have hives    Patient Measurements: Height: 5\' 11"  (180.3 cm) Weight: 82.8 kg (182 lb 8.7 oz) IBW/kg (Calculated) : 75.3 Heparin Dosing Weight: 86.4 kg   Vital Signs: Temp: 99.4 F (37.4 C) (11/23 1516) Temp Source: Oral (11/23 1516) BP: 108/77 (11/23 1600) Pulse Rate: 102 (11/23 1600)  Labs: Recent Labs    10/28/20 2040 10/29/20 0101 10/29/20 1432 10/29/20 2121 10/29/20 2128 10/30/20 0101 10/30/20 0101 10/30/20 0110 10/30/20 0259 10/30/20 0548 10/30/20 1250 10/31/20 0000 10/31/20 0030 10/31/20 0448 10/31/20 0830 10/31/20 1526  HGB  --    < >   < > 6.0*   < > 8.1*   < > 8.2* 8.0*  --   --   --  8.0*  --   --   --   HCT  --    < >  --  18.8*   < > 24.6*   < > 24.0* 24.5*  --   --   --  25.1*  --   --   --   PLT  --    < >  --  86*   < > 126*  --   --  119*  --   --   --  128*  --   --   --   LABPROT  --   --   --   --   --   --   --   --   --  15.0  --   --   --   --   --   --   INR  --   --   --   --   --   --   --   --   --  1.2  --   --   --   --   --   --   HEPARINUNFRC  --    < >   < >  --   --   --   --   --  0.10*  --    < > 0.32  --  0.36  --  0.44  CREATININE  --    < >  --  7.33*  --   --   --   --   --  11.18*  --   --   --   --  8.19*  --   TROPONINIHS >27,000*  --   --  >27,000*  --   --   --   --   --  >27,000*  --   --   --   --   --   --    < > = values in this interval not displayed.    Estimated Creatinine Clearance: 10.5 mL/min (A) (by C-G formula based on SCr of 8.19 mg/dL (H)).    Assessment: 11 yom presenting with STEMI and Afib with known dilated ascending aorta and ESRD. Patient was found to have 99% mid LAD with multivessel in LCx and RCA,  now s/p DES to LAD. Pharmacy is consulted for heparin.  HL 0.44 at goal and slightly uptrending after cardioversion.   Goal of Therapy:  Heparin level 0.3-0.7 units/ml Monitor  platelets by anticoagulation protocol: Yes   Plan:  Continue heparin at 1600 units/hr Daily HL and CBC  Monitor for s/sx of bleed and plan for continued [facility]    Benetta Spar, PharmD, BCPS, [facility] Clinical Pharmacist  Please check AMION for all [place] phone numbers After 10:00 PM, call [facility] [phone]

## 2020-10-31 NOTE — Progress Notes (Signed)
[facility] for Heparin Indication: chest pain/ACS  Allergies  Allergen Reactions  . Ibuprofen Hives  . Lisinopril Swelling    PT states he is allergic to all prils; caused facial swelling  . Naproxen Hives and Other (See Comments)    Alleve causes patient to have hives    Patient Measurements: Height: 5\' 11"  (180.3 cm) Weight: 82.8 kg (182 lb 8.7 oz) IBW/kg (Calculated) : 75.3 Heparin Dosing Weight: 86.4 kg   Vital Signs: Temp: 99 F (37.2 C) (11/22 1924) Temp Source: Oral (11/22 1924) BP: 96/76 (11/23 0000) Pulse Rate: 111 (11/22 2300)  Labs: Recent Labs    10/28/20 0604 10/28/20 0629 10/28/20 1046 10/28/20 2040 10/29/20 0101 10/29/20 1432 10/29/20 2121 10/29/20 2128 10/30/20 0101 10/30/20 0101 10/30/20 0110 10/30/20 0259 10/30/20 0548 10/30/20 1250 10/31/20 0000 10/31/20 0030  HGB   < >  --   --   --  9.9*   < > 6.0*   < > 8.1*   < > 8.2* 8.0*  --   --   --  8.0*  HCT   < >  --   --   --  31.1*  --  18.8*   < > 24.6*   < > 24.0* 24.5*  --   --   --  25.1*  PLT   < >  --   --   --  121*  --  86*   < > 126*  --   --  119*  --   --   --  128*  APTT  --  36  --   --   --   --   --   --   --   --   --   --   --   --   --   --   LABPROT  --  15.3*  --   --   --   --   --   --   --   --   --   --  15.0  --   --   --   INR  --  1.3*  --   --   --   --   --   --   --   --   --   --  1.2  --   --   --   HEPARINUNFRC  --   --   --   --   --    < >  --   --   --   --   --  0.10*  --  0.15* 0.32  --   CREATININE   < >  --   --   --  7.75*  --  7.33*  --   --   --   --   --  11.18*  --   --   --   TROPONINIHS  --  3,155*   < > >27,000*  --   --  >27,000*  --   --   --   --   --  >27,000*  --   --   --    < > = values in this interval not displayed.    Estimated Creatinine Clearance: 7.7 mL/min (A) (by C-G formula based on SCr of 11.18 mg/dL (H)).  Assessment: 59 y.o. male with CAD and Afib for heparin  Goal of Therapy:  Heparin  level 0.3-0.7 units/ml Monitor platelets by anticoagulation protocol: Yes  Plan:  Continue Heparin at current rate  Phillis Knack, PharmD, BCPS  10/31/2020 1:05 AM

## 2020-10-31 NOTE — Progress Notes (Addendum)
NAME:  Travis [name], MRN:  [id number], DOB:  01-06-1961, LOS: 3 ADMISSION DATE:  10/28/2020, CONSULTATION DATE:  10/29/20 REFERRING MD:  Cardiology, Dr. Kimber Relic , CHIEF COMPLAINT:  Hypotension, gram positive cocci on culture, AMS  Brief History   Travis [name] is a 59 year old man with a history of HTN, ESRD on HD MWF, RUE AV fistula, hx of smoking, admitted 10/28/20 with fevers, myalgias for several days, acute chest pain.     History of present illness   On Admission, found to have STEMI, multivessel disease. Pulm edema with mild hypoxemia.  S/Post PCI to the LAD on 10/29/19. Planning for staged intervention to the left circumflex and possibly right coronary artery.  EF 21-30%, diastolic dysfunction, LVEDP 35. Underwent HD on 11/20 with 4 L volume removed.  Saturation improved after dialysis (100% on RA) Fevers noted 11/20. Blood cultures grew MSSA, noted this evening. .    This evening developed Afib with RVR 180s. Adenosine given, without improvement  Amiodarone infusion started per cardiology, 150mg  bolus, then drip.  Lopressor 5mg IV and 500ccNS given. Started on Phenylephrine, was on 2103mcg on my arrival.   Cardioverted emergently at 120J for ongoing afib with hypotension (60/40) and AMS.  Converted to sinus.  Remained moderately hypotensive (MAP 60) on phenylephrine.   Patient was given one dose zosyn, switched to ancef once cultures grew back MSSA.   Cardiac stress tests annually, last done 11/16/19.    Past Medical History  HTN ESRD, HD MWF  Significant Hospital Events   Cardioversion 11/21 Cardiac cath stent to LAD 11/20  Consults:  PCCM  Nephrology  Procedures:  Central line 11/21 (femoral, unable to lay flat, slightly agitated, also on heparin, brillinta, asa)  Arterial line 11/21   Significant Diagnostic Tests:  11/23 Echo Pending  11/22 LUE Vas Upper Extremity Doppler>> Arteriovenous fistula-Aneurysmal dilatation noted.  Micro Data:   11/21 Staph aureus >  MSSA by BCID >>>  Antimicrobials:  Zosyn 11/21 x 1 Ancef 11/21 ->  Interim history/subjective:  Very anxious today Taking shallow rapid breaths with some desaturations Remains on Levo at 5 Vasopressin at 0.03 Amio at 30 Last CXR 11/20>> mild edema Today's CXR with infiltrate that correlates to pleuritic chest pain 12 Lead EKG pending Na 136 K 5.1 Creatinine 8.19 QTc on EKG>> 0.530>> will give 1 gram of Mag, and check in am  Objective   Blood pressure 93/75, pulse (!) 124, temperature 98.1 F (36.7 C), temperature source Oral, resp. rate (!) 27, height 5\' 11"  (1.803 m), weight 82.8 kg, SpO2 (!) 89 %.        Intake/Output Summary (Last 24 hours) at 10/31/2020 0932 Last data filed at 10/31/2020 0900 Gross per 24 hour  Intake 1042.98 ml  Output 1500 ml  Net -457.02 ml   Filed Weights   10/28/20 0945 10/28/20 1400 10/29/20 1630  Weight: 86.4 kg 86.4 kg 82.8 kg    Examination:  General: middle aged adult male with CO pleuritic CP and shortness of breath HENT: [place]/AT, PERRL, No LAD, No JVD Lungs: Bilateral chest excursion, Coarse through right base, Diminished per bases, coughing up some blood tinged secretions per wife Cardiovascular: IRIR, tachycardic 100-120s. Abdomen: NT, ND, NBS Extremities: no acute deformity or ROM limitation. No edema.  Neuro: alert, oriented, mild delirium. Very Anxious.   Resolved Hospital Problem list     Assessment & Plan:   Septic shock:  MSSA bacteremia likely from HD access. No signs of PNA, no evidence of AV  fistula infection.  - NE and vaso for MAP goal 65   - Holding home antihypertensives.  - Repeat cultures Pending 11/23 - ID consult pending per protocol - Doppler L  HD fistula>> Some AV dilation notes.  - Will need TEE at some point, once he clinically improves.   Afib: likely in the setting of infection and active ischemic disease. S/p cardioversion. Remains in rapid fib despite amio.  - Cont amiodarone per cardiology.   Repeat bolus given - may need repeat cardioversion 11/23 per Dr. [name].   STEMI/CAD: continued management by cardiology. On Heparin, brillinta, asa.   - Will eventually need a return to cath lab for staged PCI - Cardiology primary.    Hyponatremia - nephrology following, last HD 11/22 - Trend BMET  ESRD:  - Nephrology following.  - May need CRRT if cannot tolerate HD today with pressors.  - Avoid nephrotoxic  medications  - Trend BMET  New Right infiltrate per CXR 11/23 Coughing up secretions with scant blood per wife Pleuritic Chest pain right sided.. Shallow respirations with  Decreased sats  Plan Dilaudid low dose prn for pleuritic CP Tylenol for CP ( on MAR) Lidocaine patch to Right chest for CP CXR in am and prn  Sputum for culture Continue Ancef for now CT Chest without contrast after cardioversion Titrate oxygen to Maintain sats > 90%     Best practice:  Diet: NPO Pain/Anxiety/Delirium protocol (if indicated):  VAP protocol (if indicated):  DVT prophylaxis: on heparin gtt GI prophylaxis: famotidine Glucose control: as needed  Mobility: bed  Code Status: Full  Family Communication: Spoke with wife and daughter at bedside 11/23 Disposition: ICU  Labs   CBC: Recent Labs  Lab 10/28/20 0604 10/28/20 0604 10/29/20 0101 10/29/20 0101 10/29/20 2121 10/29/20 2121 10/29/20 2128 10/30/20 0101 10/30/20 0110 10/30/20 0259 10/31/20 0030  WBC 12.7*   < > 14.9*  --  12.9*  --   --  20.4*  --  20.9* 16.3*  NEUTROABS 11.4*  --   --   --  11.8*  --   --   --   --   --   --   HGB 9.5*   < > 9.9*   < > 6.0*   < > 8.8* 8.1* 8.2* 8.0* 8.0*  HCT 29.5*   < > 31.1*   < > 18.8*   < > 26.0* 24.6* 24.0* 24.5* 25.1*  MCV 95.5   < > 94.5  --  93.5  --   --  92.8  --  92.8 93.3  PLT 143*   < > 121*  --  86*  --   --  126*  --  119* 128*   < > = values in this interval not displayed.    Basic Metabolic Panel: Recent Labs  Lab 10/28/20 0604 10/28/20 0604 10/29/20 0101  10/29/20 2121 10/29/20 2128 10/30/20 0110 10/30/20 0548  NA 133*   < > 132* 132* 126* 125* 125*  K 3.7   < > 5.1 3.1* 4.8 4.9 5.2*  CL 86*  --  88* 99  --   --  86*  CO2 31  --  28 20*  --   --  22  GLUCOSE 140*  --  143* 208*  --   --  148*  BUN 30*  --  33* 48*  --   --  75*  CREATININE 9.73*  --  7.75* 7.33*  --   --  11.18*  CALCIUM 9.8  --  9.7 6.5*  --   --  9.1   < > = values in this interval not displayed.   GFR: Estimated Creatinine Clearance: 7.7 mL/min (A) (by C-G formula based on SCr of 11.18 mg/dL (H)). Recent Labs  Lab 10/29/20 2121 10/30/20 0101 10/30/20 0259 10/30/20 0548 10/30/20 0820 10/31/20 0030  WBC 12.9* 20.4* 20.9*  --   --  16.3*  LATICACIDVEN 2.5*  --   --  2.0* 2.1*  --     Liver Function Tests: Recent Labs  Lab 10/28/20 0604 10/30/20 0548  AST 84* 87*  ALT 87* 51*  ALKPHOS 146* 95  BILITOT 1.2 1.5*  PROT 6.6 5.6*  ALBUMIN 3.1* 2.2*   No results for input(s): LIPASE, AMYLASE in the last 168 hours. No results for input(s): AMMONIA in the last 168 hours.  ABG    Component Value Date/Time   PHART 7.452 (H) 10/30/2020 0110   PCO2ART 38.2 10/30/2020 0110   PO2ART 58 (L) 10/30/2020 0110   HCO3 26.5 10/30/2020 0110   TCO2 28 10/30/2020 0110   O2SAT 90.0 10/30/2020 0110     Coagulation Profile: Recent Labs  Lab 10/28/20 0629 10/30/20 0548  INR 1.3* 1.2    Cardiac Enzymes: No results for input(s): CKTOTAL, CKMB, CKMBINDEX, TROPONINI in the last 168 hours.  HbA1C: Hgb A1c MFr Bld  Date/Time Value Ref Range Status  10/28/2020 06:04 AM 4.3 (L) 4.8 - 5.6 % Final    Comment:    (NOTE) Pre diabetes:          5.7%-6.4%  Diabetes:              >6.4%  Glycemic control for   <7.0% adults with diabetes   01/30/2015 09:09 PM 5.1 4.8 - 5.6 % Final    Comment:    (NOTE)         Pre-diabetes: 5.7 - 6.4         Diabetes: >6.4         Glycemic control for adults with diabetes: <7.0     CBG: Recent Labs  Lab 10/28/20 1120   GLUCAP 111*     Surgical History    Past Surgical History:  Procedure Laterality Date  . AV FISTULA PLACEMENT Left 12/22/2017   Procedure: REPAIR PSEUDOANEURYSM ARTERIOVENOUS (AV) FISTULA;  Surgeon: [name], MD;  Location: [facility];  Service: Vascular;  Laterality: Left;  . BASCILIC VEIN TRANSPOSITION Left 01/05/2014   Procedure: LEFT 1ST STAGE BASCILIC VEIN TRANSPOSITION;  Surgeon: Conrad Bentonville, MD;  Location: [place];  Service: Vascular;  Laterality: Left;  . BASCILIC VEIN TRANSPOSITION Left 07/26/2014   Procedure: Left Arm Brachial Vein Transposition Second Stage;  Surgeon: Conrad Toast, MD;  Location: [place];  Service: Vascular;  Laterality: Left;  . COLONOSCOPY    . COLONOSCOPY N/A 02/02/2015   Procedure: COLONOSCOPY;  Surgeon: [name], MD;  Location: [facility] ENDOSCOPY;  Service: Endoscopy;  Laterality: N/A;  . CORONARY STENT INTERVENTION N/A 10/28/2020   Procedure: CORONARY STENT INTERVENTION;  Surgeon: Leonie Man, MD;  Location: [place] CV LAB;  Service: Cardiovascular;  Laterality: N/A;  . CORONARY/GRAFT ACUTE MI REVASCULARIZATION N/A 10/28/2020   Procedure: Coronary/Graft Acute MI Revascularization;  Surgeon: Leonie Man, MD;  Location: [place] CV LAB;  Service: Cardiovascular;  Laterality: N/A;  . ESOPHAGOGASTRODUODENOSCOPY Left 01/31/2015   Procedure: ESOPHAGOGASTRODUODENOSCOPY (EGD);  Surgeon: [name], MD;  Location: [facility] ENDOSCOPY;  Service: Endoscopy;  Laterality: Left;  .  EVALUATION UNDER ANESTHESIA WITH HEMORRHOIDECTOMY N/A 02/26/2018   Procedure: ANORECTAL EXAM UNDER ANESTHESIA  HEMORRHOIDECTOMY x 2  HEMORRHOIDAL LIGATION AND PEXY;  Surgeon: Ziah Boston, MD;  Location: [facility];  Service: General;  Laterality: N/A;  . GIVENS CAPSULE STUDY N/A 01/31/2015   Procedure: GIVENS CAPSULE STUDY;  Surgeon: [name], MD;  Location: [facility] ENDOSCOPY;  Service: Endoscopy;  Laterality: N/A;  . LEFT HEART CATH AND CORONARY ANGIOGRAPHY N/A 10/28/2020   Procedure:  LEFT HEART CATH AND CORONARY ANGIOGRAPHY;  Surgeon: Leonie Man, MD;  Location: [place] CV LAB;  Service: Cardiovascular;  Laterality: N/A;  . MOUTH SURGERY     teeth cleaning  . NEPHRECTOMY RADICAL Right 05/2015   [facility] Dr [name] for R renal mass     Social History   reports that he quit smoking about 7 years ago. His smoking use included cigars. He quit after 4.00 years of use. He has never used smokeless tobacco. He reports that he does not drink alcohol and does not use drugs.   Family History   His family history includes Hypertension in his father, mother, and sister.   Allergies Allergies  Allergen Reactions  . Ibuprofen Hives  . Lisinopril Swelling    PT states he is allergic to all prils; caused facial swelling  . Naproxen Hives and Other (See Comments)    Alleve causes patient to have hives     Home Medications  Prior to Admission medications   Medication Sig Start Date End Date Taking? Authorizing Provider  acetaminophen (TYLENOL) 325 MG tablet Take 650 mg by mouth every 6 (six) hours as needed for mild pain, fever or headache.   Yes [provider]  AURYXIA 1 GM 210 MG(Fe) tablet Take 210-630 mg by mouth See admin instructions. 630mg  three times daily with meals and 210-420mg  with snacks 10/03/17  Yes [provider]  cinacalcet (SENSIPAR) 60 MG tablet Take 120 mg by mouth daily. 10/14/20  Yes [provider]  multivitamin (RENA-VIT) TABS tablet Take 1 tablet by mouth daily. 10/13/20  Yes [provider]  Vitamin D, Ergocalciferol, (DRISDOL) 1.25 MG (50000 UNIT) CAPS capsule Take 50,000 Units by mouth once a week. 10/17/20  Yes [provider]  iron polysaccharides (NIFEREX) 150 MG capsule Take 1 capsule (150 mg total) by mouth 2 (two) times daily. Patient not taking: Reported on 10/28/2020 02/02/15   [name], MD  labetalol (NORMODYNE) 300 MG tablet Take 1 tablet (300 mg total) by mouth 2 (two) times daily. Patient  not taking: Reported on 10/28/2020 02/02/15   [name], MD     Critical care time: 56 minutes     Magdalen Spatz, MSN, AGACNP-BC [place] for personal pager PCCM on call pager [phone]  10/31/2020 9:32 AM

## 2020-10-31 NOTE — Progress Notes (Signed)
  Echocardiogram 2D Echocardiogram with 3D has been performed.  [name] M 10/31/2020, 1:34 PM

## 2020-10-31 NOTE — Progress Notes (Signed)
Attempted echo. Patients HR is still high. Per Dr. [name], holding off until patient has TEE/cardioversion.

## 2020-11-01 ENCOUNTER — Inpatient Hospital Stay (HOSPITAL_COMMUNITY): Payer: Medicare Other

## 2020-11-01 DIAGNOSIS — I340 Nonrheumatic mitral (valve) insufficiency: Secondary | ICD-10-CM

## 2020-11-01 DIAGNOSIS — I351 Nonrheumatic aortic (valve) insufficiency: Secondary | ICD-10-CM

## 2020-11-01 DIAGNOSIS — I4891 Unspecified atrial fibrillation: Secondary | ICD-10-CM | POA: Diagnosis not present

## 2020-11-01 DIAGNOSIS — I5041 Acute combined systolic (congestive) and diastolic (congestive) heart failure: Secondary | ICD-10-CM | POA: Diagnosis not present

## 2020-11-01 DIAGNOSIS — A4101 Sepsis due to Methicillin susceptible Staphylococcus aureus: Secondary | ICD-10-CM | POA: Diagnosis not present

## 2020-11-01 DIAGNOSIS — E785 Hyperlipidemia, unspecified: Secondary | ICD-10-CM

## 2020-11-01 DIAGNOSIS — I2102 ST elevation (STEMI) myocardial infarction involving left anterior descending coronary artery: Secondary | ICD-10-CM | POA: Diagnosis not present

## 2020-11-01 DIAGNOSIS — J15211 Pneumonia due to Methicillin susceptible Staphylococcus aureus: Secondary | ICD-10-CM

## 2020-11-01 DIAGNOSIS — I2109 ST elevation (STEMI) myocardial infarction involving other coronary artery of anterior wall: Secondary | ICD-10-CM | POA: Diagnosis not present

## 2020-11-01 DIAGNOSIS — Z955 Presence of coronary angioplasty implant and graft: Secondary | ICD-10-CM

## 2020-11-01 DIAGNOSIS — I469 Cardiac arrest, cause unspecified: Secondary | ICD-10-CM

## 2020-11-01 DIAGNOSIS — I132 Hypertensive heart and chronic kidney disease with heart failure and with stage 5 chronic kidney disease, or end stage renal disease: Secondary | ICD-10-CM | POA: Diagnosis not present

## 2020-11-01 DIAGNOSIS — T8144XA Sepsis following a procedure, initial encounter: Secondary | ICD-10-CM | POA: Diagnosis not present

## 2020-11-01 HISTORY — PX: TRANSESOPHAGEAL ECHOCARDIOGRAM: SHX273

## 2020-11-01 LAB — HEMOGLOBIN AND HEMATOCRIT, BLOOD
HCT: 21.7 % — ABNORMAL LOW (ref 39.0–52.0)
Hemoglobin: 7.3 g/dL — ABNORMAL LOW (ref 13.0–17.0)

## 2020-11-01 LAB — RENAL FUNCTION PANEL
Albumin: 1.6 g/dL — ABNORMAL LOW (ref 3.5–5.0)
Albumin: 1.7 g/dL — ABNORMAL LOW (ref 3.5–5.0)
Anion gap: 19 — ABNORMAL HIGH (ref 5–15)
Anion gap: 22 — ABNORMAL HIGH (ref 5–15)
BUN: 82 mg/dL — ABNORMAL HIGH (ref 6–20)
BUN: 88 mg/dL — ABNORMAL HIGH (ref 6–20)
CO2: 23 mmol/L (ref 22–32)
CO2: 24 mmol/L (ref 22–32)
Calcium: 8.7 mg/dL — ABNORMAL LOW (ref 8.9–10.3)
Calcium: 9 mg/dL (ref 8.9–10.3)
Chloride: 95 mmol/L — ABNORMAL LOW (ref 98–111)
Chloride: 92 mmol/L — ABNORMAL LOW (ref 98–111)
Creatinine, Ser: 10.59 mg/dL — ABNORMAL HIGH (ref 0.61–1.24)
Creatinine, Ser: 10.91 mg/dL — ABNORMAL HIGH (ref 0.61–1.24)
GFR, Estimated: 5 mL/min — ABNORMAL LOW (ref >60)
GFR, Estimated: 5 mL/min — ABNORMAL LOW (ref >60)
Glucose, Bld: 169 mg/dL — ABNORMAL HIGH (ref 70–99)
Glucose, Bld: 88 mg/dL (ref 70–99)
Phosphorus: 8.3 mg/dL — ABNORMAL HIGH (ref 2.5–4.6)
Phosphorus: 7.1 mg/dL — ABNORMAL HIGH (ref 2.5–4.6)
Potassium: 5 mmol/L (ref 3.5–5.1)
Potassium: 5.4 mmol/L — ABNORMAL HIGH (ref 3.5–5.1)
Sodium: 137 mmol/L (ref 135–145)
Sodium: 138 mmol/L (ref 135–145)

## 2020-11-01 LAB — BASIC METABOLIC PANEL
Anion gap: 18 — ABNORMAL HIGH (ref 5–15)
BUN: 75 mg/dL — ABNORMAL HIGH (ref 6–20)
CO2: 20 mmol/L — ABNORMAL LOW (ref 22–32)
Calcium: 8.9 mg/dL (ref 8.9–10.3)
Chloride: 95 mmol/L — ABNORMAL LOW (ref 98–111)
Creatinine, Ser: 8.15 mg/dL — ABNORMAL HIGH (ref 0.61–1.24)
GFR, Estimated: 7 mL/min — ABNORMAL LOW (ref >60)
Glucose, Bld: 103 mg/dL — ABNORMAL HIGH (ref 70–99)
Potassium: 5.1 mmol/L (ref 3.5–5.1)
Sodium: 133 mmol/L — ABNORMAL LOW (ref 135–145)

## 2020-11-01 LAB — POCT I-STAT 7, (LYTES, BLD GAS, ICA,H+H)
Acid-base deficit: 7 mmol/L — ABNORMAL HIGH (ref 0.0–2.0)
Bicarbonate: 19.4 mmol/L — ABNORMAL LOW (ref 20.0–28.0)
Calcium, Ion: 1.14 mmol/L — ABNORMAL LOW (ref 1.15–1.40)
HCT: 20 % — ABNORMAL LOW (ref 39.0–52.0)
Hemoglobin: 6.8 g/dL — CL (ref 13.0–17.0)
O2 Saturation: 92 %
Potassium: 5.7 mmol/L — ABNORMAL HIGH (ref 3.5–5.1)
Sodium: 134 mmol/L — ABNORMAL LOW (ref 135–145)
TCO2: 21 mmol/L — ABNORMAL LOW (ref 22–32)
pCO2 arterial: 39.9 mmHg (ref 32.0–48.0)
pH, Arterial: 7.295 — ABNORMAL LOW (ref 7.350–7.450)
pO2, Arterial: 71 mmHg — ABNORMAL LOW (ref 83.0–108.0)

## 2020-11-01 LAB — COOXEMETRY PANEL
Carboxyhemoglobin: 1 % (ref 0.5–1.5)
Methemoglobin: 1 % (ref 0.0–1.5)
O2 Saturation: 82 %
Total hemoglobin: 9.2 g/dL — ABNORMAL LOW (ref 12.0–16.0)

## 2020-11-01 LAB — PREPARE RBC (CROSSMATCH)

## 2020-11-01 LAB — CBC
HCT: 19.3 % — ABNORMAL LOW (ref 39.0–52.0)
HCT: 21.6 % — ABNORMAL LOW (ref 39.0–52.0)
Hemoglobin: 6.1 g/dL — CL (ref 13.0–17.0)
Hemoglobin: 7.6 g/dL — ABNORMAL LOW (ref 13.0–17.0)
MCH: 30.5 pg (ref 26.0–34.0)
MCH: 32.6 pg (ref 26.0–34.0)
MCHC: 31.6 g/dL (ref 30.0–36.0)
MCHC: 35.2 g/dL (ref 30.0–36.0)
MCV: 96.5 fL (ref 80.0–100.0)
MCV: 92.7 fL (ref 80.0–100.0)
Platelets: 234 10*3/uL (ref 150–400)
Platelets: 291 10*3/uL (ref 150–400)
RBC: 2 MIL/uL — ABNORMAL LOW (ref 4.22–5.81)
RBC: 2.33 MIL/uL — ABNORMAL LOW (ref 4.22–5.81)
RDW: 15.9 % — ABNORMAL HIGH (ref 11.5–15.5)
RDW: 15.7 % — ABNORMAL HIGH (ref 11.5–15.5)
WBC: 14.7 10*3/uL — ABNORMAL HIGH (ref 4.0–10.5)
WBC: 28.1 10*3/uL — ABNORMAL HIGH (ref 4.0–10.5)
nRBC: 0.5 % — ABNORMAL HIGH (ref 0.0–0.2)
nRBC: 0.6 % — ABNORMAL HIGH (ref 0.0–0.2)

## 2020-11-01 LAB — GLUCOSE, CAPILLARY
Glucose-Capillary: 108 mg/dL — ABNORMAL HIGH (ref 70–99)
Glucose-Capillary: 106 mg/dL — ABNORMAL HIGH (ref 70–99)

## 2020-11-01 LAB — TRIGLYCERIDES: Triglycerides: 178 mg/dL — ABNORMAL HIGH (ref <150)

## 2020-11-01 LAB — HEPARIN LEVEL (UNFRACTIONATED)
Heparin Unfractionated: 0.49 IU/mL (ref 0.30–0.70)
Heparin Unfractionated: 0.47 IU/mL (ref 0.30–0.70)

## 2020-11-01 LAB — ECHO TEE: P 1/2 time: 393 msec

## 2020-11-01 LAB — MAGNESIUM: Magnesium: 2.8 mg/dL — ABNORMAL HIGH (ref 1.7–2.4)

## 2020-11-01 LAB — LACTIC ACID, PLASMA
Lactic Acid, Venous: 5.5 mmol/L (ref 0.5–1.9)
Lactic Acid, Venous: 2.1 mmol/L (ref 0.5–1.9)

## 2020-11-01 MED ORDER — MIDAZOLAM HCL 2 MG/2ML IJ SOLN: 2.0000 mg | INTRAMUSCULAR | Status: DC | PRN

## 2020-11-01 MED ORDER — HEPARIN SODIUM (PORCINE) 1000 UNIT/ML DIALYSIS
1000.0000 [IU] | INTRAMUSCULAR | Status: DC | PRN
  Administered 2020-11-01: 3000 [IU] via INTRAVENOUS_CENTRAL
  Administered 2020-11-03: 3100 [IU] via INTRAVENOUS_CENTRAL
  Filled 2020-11-01: qty 6
  Filled 2020-11-01: qty 2
  Filled 2020-11-01: qty 3

## 2020-11-01 MED ORDER — CEFAZOLIN SODIUM-DEXTROSE 2-4 GM/100ML-% IV SOLN: 2.0000 g | Freq: Two times a day (BID) | INTRAVENOUS | Status: DC

## 2020-11-01 MED ORDER — PRISMASOL BGK 4/2.5 32-4-2.5 MEQ/L EC SOLN: Status: DC

## 2020-11-01 MED ORDER — PRISMASOL BGK 4/2.5 32-4-2.5 MEQ/L REPLACEMENT SOLN: Status: DC

## 2020-11-01 MED ORDER — ASPIRIN 81 MG PO CHEW: 81.0000 mg | CHEWABLE_TABLET | Freq: Every day | ORAL | Status: DC

## 2020-11-01 MED ORDER — SODIUM CHLORIDE 0.9 % FOR CRRT: INTRAVENOUS_CENTRAL | Status: DC | PRN

## 2020-11-01 MED ORDER — MIDAZOLAM HCL 2 MG/2ML IJ SOLN: 0.5000 mg | Freq: Once | INTRAMUSCULAR | Status: DC

## 2020-11-01 MED ORDER — MIDAZOLAM HCL 2 MG/2ML IJ SOLN: 1.0000 mg | Freq: Once | INTRAMUSCULAR | Status: AC

## 2020-11-01 MED ORDER — MIDAZOLAM HCL 2 MG/2ML IJ SOLN
INTRAMUSCULAR | Status: AC
  Administered 2020-11-01: 1 mg via INTRAVENOUS
  Filled 2020-11-01: qty 2

## 2020-11-01 MED ORDER — ASPIRIN 300 MG RE SUPP
150.0000 mg | Freq: Every day | RECTAL | Status: DC
  Administered 2020-11-01 – 2020-11-05 (×5): 150 mg via RECTAL
  Filled 2020-11-01 (×5): qty 1

## 2020-11-01 MED ORDER — HEPARIN (PORCINE) 25000 UT/250ML-% IV SOLN
1800.0000 [IU]/h | INTRAVENOUS | Status: DC
  Administered 2020-11-02: 1550 [IU]/h via INTRAVENOUS
  Administered 2020-11-02 – 2020-11-04 (×3): 1400 [IU]/h via INTRAVENOUS
  Administered 2020-11-05: 1500 [IU]/h via INTRAVENOUS
  Administered 2020-11-06 (×2): 1800 [IU]/h via INTRAVENOUS
  Filled 2020-11-01 (×8): qty 250

## 2020-11-01 MED ORDER — SODIUM CHLORIDE 0.9% IV SOLUTION: Freq: Once | INTRAVENOUS | Status: DC

## 2020-11-01 MED ORDER — SODIUM CHLORIDE 0.9 % IV SOLN
10.0000 mg | INTRAVENOUS | Status: DC
  Administered 2020-11-02 – 2020-11-06 (×5): 10 mg via INTRAVENOUS
  Filled 2020-11-01 (×5): qty 1

## 2020-11-01 MED ORDER — AMIODARONE HCL IN DEXTROSE 360-4.14 MG/200ML-% IV SOLN
30.0000 mg/h | INTRAVENOUS | Status: DC
  Administered 2020-11-01 – 2020-11-03 (×4): 30 mg/h via INTRAVENOUS
  Filled 2020-11-01 (×5): qty 200

## 2020-11-01 MED ORDER — NALOXONE HCL 0.4 MG/ML IJ SOLN
INTRAMUSCULAR | Status: AC
  Administered 2020-11-01: 0.4 mg
  Filled 2020-11-01: qty 1

## 2020-11-01 MED ORDER — INSULIN ASPART 100 UNIT/ML IV SOLN
5.0000 [IU] | Freq: Once | INTRAVENOUS | Status: AC
  Administered 2020-11-01: 5 [IU] via INTRAVENOUS

## 2020-11-01 MED ORDER — PRISMASOL BGK 0/2.5 32-2.5 MEQ/L EC SOLN
Status: DC
  Filled 2020-11-01 (×42): qty 5000

## 2020-11-01 MED ORDER — MIDAZOLAM HCL 2 MG/2ML IJ SOLN
1.0000 mg | Freq: Once | INTRAMUSCULAR | Status: AC
  Administered 2020-11-01: 1 mg via INTRAVENOUS

## 2020-11-01 MED ORDER — AMIODARONE HCL 200 MG PO TABS: 400.0000 mg | ORAL_TABLET | Freq: Two times a day (BID) | ORAL | Status: DC

## 2020-11-01 MED ORDER — MIDAZOLAM HCL 2 MG/2ML IJ SOLN
2.0000 mg | INTRAMUSCULAR | Status: DC | PRN
  Administered 2020-11-06 – 2020-11-10 (×24): 2 mg via INTRAVENOUS
  Filled 2020-11-01 (×26): qty 2

## 2020-11-01 MED ORDER — FAMOTIDINE 20 MG PO TABS: 10.0000 mg | ORAL_TABLET | Freq: Every day | ORAL | Status: DC

## 2020-11-01 MED ORDER — ACETAMINOPHEN 325 MG PO TABS
650.0000 mg | ORAL_TABLET | ORAL | Status: DC | PRN
  Administered 2020-11-05 – 2020-11-20 (×8): 650 mg
  Filled 2020-11-01 (×8): qty 2

## 2020-11-01 MED ORDER — DEXTROSE 50 % IV SOLN
1.0000 | Freq: Once | INTRAVENOUS | Status: AC
  Administered 2020-11-01: 50 mL via INTRAVENOUS
  Filled 2020-11-01: qty 50

## 2020-11-01 MED ORDER — VECURONIUM BROMIDE 10 MG IV SOLR
INTRAVENOUS | Status: AC
  Administered 2020-11-01: 10 mg via INTRAVENOUS
  Filled 2020-11-01: qty 10

## 2020-11-01 MED ORDER — MIDAZOLAM HCL 2 MG/2ML IJ SOLN
INTRAMUSCULAR | Status: AC
  Filled 2020-11-01: qty 2

## 2020-11-01 MED ORDER — CEFAZOLIN SODIUM-DEXTROSE 2-4 GM/100ML-% IV SOLN
2.0000 g | Freq: Two times a day (BID) | INTRAVENOUS | Status: DC
  Administered 2020-11-01 (×2): 2 g via INTRAVENOUS
  Filled 2020-11-01 (×3): qty 100

## 2020-11-01 MED ORDER — VECURONIUM BROMIDE 10 MG IV SOLR: 10.0000 mg | Freq: Once | INTRAVENOUS | Status: AC

## 2020-11-01 MED ORDER — PROPOFOL 1000 MG/100ML IV EMUL
5.0000 ug/kg/min | INTRAVENOUS | Status: DC
  Administered 2020-11-01 (×3): 30 ug/kg/min via INTRAVENOUS
  Administered 2020-11-01 (×2): 50 ug/kg/min via INTRAVENOUS
  Administered 2020-11-02 (×3): 45 ug/kg/min via INTRAVENOUS
  Administered 2020-11-02: 50 ug/kg/min via INTRAVENOUS
  Administered 2020-11-02: 60 ug/kg/min via INTRAVENOUS
  Administered 2020-11-02 – 2020-11-03 (×6): 45 ug/kg/min via INTRAVENOUS
  Filled 2020-11-01 (×12): qty 100
  Filled 2020-11-01: qty 200
  Filled 2020-11-01 (×3): qty 100

## 2020-11-01 MED ORDER — SODIUM CHLORIDE 0.9 % IV SOLN
0.7500 ug/kg/min | INTRAVENOUS | Status: DC
  Administered 2020-11-01 – 2020-11-02 (×3): 0.75 ug/kg/min via INTRAVENOUS
  Filled 2020-11-01 (×6): qty 50

## 2020-11-01 MED ORDER — SODIUM BICARBONATE 8.4 % IV SOLN
50.0000 meq | Freq: Once | INTRAVENOUS | Status: AC
  Administered 2020-11-01: 50 meq via INTRAVENOUS
  Filled 2020-11-01: qty 50

## 2020-11-01 MED ORDER — CALCIUM GLUCONATE-NACL 1-0.675 GM/50ML-% IV SOLN
1.0000 g | Freq: Once | INTRAVENOUS | Status: AC
  Administered 2020-11-01: 1000 mg via INTRAVENOUS
  Filled 2020-11-01: qty 50

## 2020-11-01 NOTE — Procedures (Signed)
Arterial Catheter Insertion Procedure Note  CHASE ARNALL  [id number]  07/11/1961  Date:11/01/20  Time:12:04 PM    Provider Performing: Kipp Brood    Procedure: Insertion of Arterial Line [phone]) with US guidance ([id number])   Indication(s) Blood pressure monitoring and/or need for frequent ABGs  Consent Unable to obtain consent due to emergent nature of procedure.  Anesthesia None   Time Out Verified patient identification, verified procedure, site/side was marked, verified correct patient position, special equipment/implants available, medications/allergies/relevant history reviewed, required imaging and test results available.   Sterile Technique Maximal sterile technique including full sterile barrier drape, hand hygiene, sterile gown, sterile gloves, mask, hair covering, sterile ultrasound probe cover (if used).   Procedure Description Area of catheter insertion was cleaned with chlorhexidine and draped in sterile fashion. With real-time ultrasound guidance an arterial catheter was placed into the right radial artery.  Appropriate arterial tracings confirmed on monitor.     Complications/Tolerance None; patient tolerated the procedure well.   EBL Minimal   Specimen(s) None  Kipp Brood, MD [facility] ICU Physician [place]  Pager: [phone] Or Epic Secure Chat After hours: [phone].  11/01/2020, 12:05 PM

## 2020-11-01 NOTE — Progress Notes (Signed)
Unable to place NG/OG tube despite several attempts including during bedside TEE. Dr. [name] paged regarding PO medications and patient placed on IV Amiodarone and Cangrelor until Coretrak placement on Friday.

## 2020-11-01 NOTE — Progress Notes (Signed)
[facility] Progress Note Patient Name: Travis [name] DOB: [birth year] MRN: [id number]   Date of Service  11/01/2020  HPI/Events of Note  Wife called back and I was able to update her of the events that transpired  eICU Interventions  She or her daughter will be visiting in the morning     Intervention Category Minor Interventions: Communication with other healthcare providers and/or family  Judd Lien 11/01/2020, 4:58 AM

## 2020-11-01 NOTE — Progress Notes (Signed)
  Echocardiogram Echocardiogram Transesophageal has been performed.  Jannett Celestine 11/01/2020, 2:57 PM

## 2020-11-01 NOTE — Progress Notes (Addendum)
PCCM interval progress note:  Pt was extremely agitated overnight and had a brief PEA arrest, see code note.   Pt on 50mcg Levophed post-arrest. K 5.7 on ABG, treated with Calcium, insulin and bicarb. Pt is on heparin gtt, so unlikely PE.  Repeat EKG and labs are pending.  After ROSC pt was waking up and trying to pull out ETT, monitor for fever but no indication for TTM  I attempted to reach wife Myra but got no answer. Left message to call back for an update.  Otilio Carpen Taran Haynesworth, PA-C

## 2020-11-01 NOTE — Progress Notes (Signed)
RT transported patient from [facility] to CT and back with RN. No complications. RT will continue to monitor.

## 2020-11-01 NOTE — Progress Notes (Addendum)
Progress Note  Patient Name: Travis [name] Date of Encounter: 11/01/2020  [facility] Cardiologist: Glenetta Hew, MD   Patient Profile     59 y.o. male with ESRD on IHD, Ascending AoTAA - admitted with  Anterior STEMI 11/21 ->  PCI of the LAD. He has residual disease in his right coronary and AVG-Cx for planned PCI.  PTA was not feeing well for several days - chills & nausea.  On Cath - LVEDP 30-35 mmHg -> had been drinking lots of H20 & in settting of ACS - acute DHF --> developed pulmonary Edema = Acute HD.   Was then febrile PM 11/20-21 - > now 2/2 BC + Staph a (1 reported MSSA).  Overnight 11/21-22 -> went into Afib RVR - became hypotensive - Now in Septic Shock.   Cardioverted in 11/01/2019 1 in the morning. Intubated late afternoon 10/31/2020   Late night, early morning morning events 11/23-24=> patient became extremely agitated, broke through sedation.  Was given several boluses of sedation and subsequently went into PEA arrest-ROSC after 1 minute.  Now on heavy sedation using propofol and Dilaudid.  As a result, requiring increased dose of Levophed and vasopressin.  Principal Problem:   Acute ST elevation myocardial infarction (STEMI) of anterolateral wall (HCC) Active Problems:   ESRD (end stage renal disease) ([place])   3 V- CAD w/ ACS/STEMI: Culprit = 99% mLAD @ D1 (DES PCI jailing D1); 99% ost-AVGLCx, 80% calcified napkin ring prox RCA.    Presence of drug coated stent in LAD coronary artery: Resolute Onyx DES 2.75 mm x 18 mm (3.1 mm) at major D1 & SP1.   Acute combined systolic and diastolic heart failure (HCC)   Atrial fibrillation with RVR (HCC)   MSSA bacteremia   Septic shock due to Staphylococcus aureus (HCC)   Hyperlipidemia with target LDL less than 70   Essential hypertension   Dyspnea   Subjective   Currently intubated and sedated.  -> Successful cardioversion yesterday, but had progressively worsening respiratory distress over the course of the  afternoon.  Ended up getting intubated. -->  Transthoracic echo done now showing moderate severe MR with inferior hypokinesis.    Overnight broke through sedation, then with boluses of sedation went into PEA arrest requiring 1 minute CPR with ROSC.  Inpatient Medications    Scheduled Meds: . sodium chloride   Intravenous Once  . amiodarone  400 mg Oral BID  . aspirin  81 mg Oral Daily  . atorvastatin  80 mg Oral q1800  . chlorhexidine gluconate (MEDLINE KIT)  15 mL Mouth Rinse BID  . Chlorhexidine Gluconate Cloth  6 each Topical Q0600  . docusate  100 mg Per Tube BID  . doxercalciferol  5 mcg Intravenous Q M,W,F-HD  . famotidine  10 mg Oral Daily  . ferric citrate  210 mg Oral With snacks  . ferric citrate  630 mg Oral TID with meals  . lidocaine  1 patch Transdermal Q24H  . mouth rinse  15 mL Mouth Rinse 10 times per day  . mupirocin ointment  1 application Nasal BID  . polyethylene glycol  17 g Per Tube Daily  . sodium chloride flush  10-40 mL Intracatheter Q12H  . sodium chloride flush  3 mL Intravenous Q12H  . Thrombi-Pad  1 each Topical Once  . ticagrelor  90 mg Oral BID   Continuous Infusions: .  prismasol BGK 4/2.5    .  prismasol BGK 4/2.5    . sodium  chloride 10 mL/hr at 10/29/20 2300  . sodium chloride Stopped (10/28/20 0954)  . sodium chloride Stopped (10/30/20 1744)  .  ceFAZolin (ANCEF) IV Stopped (10/31/20 2241)  . dexmedetomidine (PRECEDEX) IV infusion Stopped (11/01/20 0317)  . HYDROmorphone Stopped (11/01/20 0318)  . nitroGLYCERIN Stopped (10/28/20 2100)  . norepinephrine (LEVOPHED) Adult infusion 20 mcg/min (11/01/20 0700)  . phenylephrine (NEO-SYNEPHRINE) Adult infusion Stopped (10/29/20 2237)  . prismasol BGK 4/2.5    . propofol (DIPRIVAN) infusion 30 mcg/kg/min (11/01/20 0812)  . vasopressin 0.03 Units/min (11/01/20 0833)   PRN Meds: sodium chloride, acetaminophen, heparin, HYDROmorphone, HYDROmorphone (DILAUDID) injection, HYDROmorphone (DILAUDID)  injection, midazolam, nitroGLYCERIN, ondansetron (ZOFRAN) IV, sodium chloride, sodium chloride flush, sodium chloride flush   Vital Signs    Vitals:   11/01/20 0828 11/01/20 0830 11/01/20 0843 11/01/20 0845  BP: (!) 92/46 92/62 95/62  92/64  Pulse: 74  74   Resp: 16 17 16 16   Temp: 98.6 F (37 C)  98.6 F (37 C)   TempSrc:   Oral   SpO2:      Weight:      Height:        Intake/Output Summary (Last 24 hours) at 11/01/2020 0943 Last data filed at 11/01/2020 0813 Gross per 24 hour  Intake 1826.98 ml  Output --  Net 1826.98 ml   Last 3 Weights 10/29/2020 10/28/2020 10/28/2020  Weight (lbs) 182 lb 8.7 oz 190 lb 7.6 oz 190 lb 7.6 oz  Weight (kg) 82.8 kg 86.4 kg 86.4 kg      Telemetry    Currently in sinus rhythm in the 70s.- Personally Reviewed  ECG    EKG from 4:42 AM shows unusual access, but probably sinus rhythm, rate 84 with PR interval 200.  Anterolateral infarct noted.. - Personally Reviewed  Physical Exam   GEN:  Now intubated, sedated heavily on on propofol and pressors (vasopressin plus Levophed..   Neck: Elevated JVP roughly 8 to 10 cm water. Cardiac:  RRR.. Interestingly, he does have a soft 2/6 HSM at apex but not what would be expected from severe MR Respiratory:  Diffuse coarse respiratory sounds, notably worse rhonchi/rales on right base. GI:  Soft NABS.. MS:  No C/C/E.  Right femoral access stable.  No hematoma.  Left groin access no hematoma.  No signs of retroperitoneal bleeding stigmata. Neuro:   Intubated and sedated  Labs    High Sensitivity Troponin:   Recent Labs  Lab 10/28/20 0629 10/28/20 1046 10/28/20 2040 10/29/20 2121 10/30/20 0548  TROPONINIHS 3,155* >27,000* >27,000* >27,000* >27,000*      Chemistry Recent Labs  Lab 10/28/20 0604 10/29/20 0101 10/30/20 0548 10/30/20 0548 10/31/20 0830 10/31/20 0830 10/31/20 1811 11/01/20 0336 11/01/20 0430  NA 133*   < > 125*   < > 136   < > 134* 134* 137  K 3.7   < > 5.2*   < > 5.1   <  > 5.0 5.7* 5.0  CL 86*   < > 86*  --  93*  --   --   --  95*  CO2 31   < > 22  --  26  --   --   --  23  GLUCOSE 140*   < > 148*  --  118*  --   --   --  169*  BUN 30*   < > 75*  --  52*  --   --   --  82*  CREATININE 9.73*   < > 11.18*  --  8.19*  --   --   --  10.59*  CALCIUM 9.8   < > 9.1  --  8.9  --   --   --  8.7*  PROT 6.6  --  5.6*  --   --   --   --   --   --   ALBUMIN 3.1*  --  2.2*  --  1.9*  --   --   --  1.6*  AST 84*  --  87*  --   --   --   --   --   --   ALT 87*  --  51*  --   --   --   --   --   --   ALKPHOS 146*  --  95  --   --   --   --   --   --   BILITOT 1.2  --  1.5*  --   --   --   --   --   --   GFRNONAA 6*   < > 5*  --  7*  --   --   --  5*  ANIONGAP 16*   < > 17*  --  17*  --   --   --  19*   < > = values in this interval not displayed.     Hematology Recent Labs  Lab 10/30/20 0259 10/30/20 0259 10/31/20 0030 10/31/20 0030 10/31/20 1811 11/01/20 0336 11/01/20 0430  WBC 20.9*  --  16.3*  --   --   --  14.7*  RBC 2.64*  --  2.69*  --   --   --  2.00*  HGB 8.0*   < > 8.0*   < > 6.8* 6.8* 6.1*  HCT 24.5*   < > 25.1*   < > 20.0* 20.0* 19.3*  MCV 92.8  --  93.3  --   --   --  96.5  MCH 30.3  --  29.7  --   --   --  30.5  MCHC 32.7  --  31.9  --   --   --  31.6  RDW 14.4  --  14.6  --   --   --  15.9*  PLT 119*  --  128*  --   --   --  234   < > = values in this interval not displayed.    BNPNo results for input(s): BNP, PROBNP in the last 168 hours.   DDimer No results for input(s): DDIMER in the last 168 hours.   Radiology    DG Abd 1 View  Result Date: 10/31/2020 CLINICAL DATA:  Nasogastric tube placement. EXAM: ABDOMEN - 1 VIEW COMPARISON:  October 31, 2020 (8:47 p.m.) FINDINGS: A nasogastric tube is seen with its distal tip overlying the expected region of the gastroesophageal junction. Stable gastric distension is noted. The bowel gas pattern is otherwise normal. No radio-opaque calculi or other significant radiographic abnormality are seen.  IMPRESSION: 1. Stable nasogastric tube positioning, as described above. Electronically Signed   By: Virgina Norfolk M.D.   On: 10/31/2020 21:53   DG Abd 1 View  Result Date: 10/31/2020 CLINICAL DATA:  59 year old male with NG placement. EXAM: ABDOMEN - 1 VIEW COMPARISON:  None. FINDINGS: Partially visualized enteric tube with side-port in the distal esophagus and tip in the region of the GE junction. Recommend further advancing of the tube by an additional 17 cm. There is gaseous  distension of the stomach. IMPRESSION: Enteric tube with side-port in the distal esophagus and tip in the region of the GE junction. Recommend further advancing of the tube by an additional 17 cm. Electronically Signed   By: Anner Crete M.D.   On: 10/31/2020 20:58   CT CHEST WO CONTRAST  Result Date: 10/31/2020 CLINICAL DATA:  Chest pain and increased shortness of breath. EXAM: CT CHEST WITHOUT CONTRAST TECHNIQUE: Multidetector CT imaging of the chest was performed following the standard protocol without IV contrast. COMPARISON:  None. FINDINGS: Cardiovascular: The heart size is mildly increased. No substantial pericardial effusion. Coronary artery calcification is evident. Atherosclerotic calcification is noted in the wall of the thoracic aorta. Mediastinum/Nodes: Scattered upper normal mediastinal lymph nodes evident. No evidence for gross hilar lymphadenopathy although assessment is limited by the lack of intravenous contrast on today's study. The esophagus has normal imaging features. There is no axillary lymphadenopathy. Lungs/Pleura: Extensive multifocal nodular and patchy airspace disease is seen in both lungs. Upper lung disease has a slight peripheral predominance and a 3.1 cm nodular consolidative opacity in the right upper lobe is cavitated (36/4). Confluent airspace disease is seen in the posterior right middle lobe with air bronchograms and relatively diffuse involvement noted right lower lobe as well. No  pleural effusion. Upper Abdomen: 17 mm left adrenal nodule cannot be characterized. Liver parenchyma is heterogeneous Musculoskeletal: No worrisome lytic or sclerotic osseous abnormality. Bony mineralization suggests chronic renal disease. IMPRESSION: 1. Extensive multifocal nodular and patchy airspace disease in both lungs with a slight peripheral predominance in the upper lungs. 3.1 cm nodular consolidative opacity in the right upper lobe is cavitated. Imaging features likely related to multifocal pneumonia. Septic emboli and metastatic disease considered less likely but not excluded. 2. 17 mm left adrenal nodule cannot be characterized. MRI abdomen with and without contrast could be used to further evaluate as clinically warranted. 3. Aortic Atherosclerosis (ICD10-I70.0). Electronically Signed   By: Misty Stanley M.D.   On: 10/31/2020 11:32   DG Chest Port 1 View  Result Date: 11/01/2020 CLINICAL DATA:  Cardiac arrest. EXAM: PORTABLE CHEST 1 VIEW COMPARISON:  One-view chest x-ray 10/31/2020 FINDINGS: Endotracheal tube is stable and in satisfactory position. Enteric tube terminates in the distal esophagus. Proximal aspect of the tube is coiled in the upper esophagus, above the thoracic inlet. Defibrillator pads are in place. Heart is enlarged. Interstitial airspace opacities are again noted bilaterally, right greater than left. Aeration is improved. IMPRESSION: 1. Stable and satisfactory positioning of the endotracheal tube. 2. Stable cardiomegaly. 3. Improved aeration bilaterally. 4. Enteric tube terminates in the distal esophagus. Proximal aspect of tube is coiled in the cervical esophagus Electronically Signed   By: San Morelle M.D.   On: 11/01/2020 04:05   DG CHEST PORT 1 VIEW  Result Date: 10/31/2020 CLINICAL DATA:  Orogastric tube placement EXAM: PORTABLE CHEST 1 VIEW COMPARISON:  Earlier today FINDINGS: Endotracheal tube with tip between the clavicular heads and carina. The orogastric  tube loops through the esophagus both proximally and distally. The tip does not reach the stomach, which is gas distended since prior. Extensive infiltrate on the right more than left with possible volume loss at the right base where there is progressively dense opacity. Generous heart size. No visible pneumothorax. These results will be called to the ordering clinician or representative by the Radiologist Assistant, and communication documented in the PACS or [employer]. IMPRESSION: 1. Malpositioned orogastric tube which loops in the esophagus. The stomach is gas dilated.  2. Endotracheal tube in unremarkable position. 3. Multifocal airspace disease with progressively dense opacity at the right base. Electronically Signed   By: Monte Fantasia M.D.   On: 10/31/2020 20:18   DG CHEST PORT 1 VIEW  Result Date: 10/31/2020 CLINICAL DATA:  Right-sided chest pain and shortness of breath EXAM: PORTABLE CHEST 1 VIEW COMPARISON:  10/28/2020 FINDINGS: Cardiac shadow is enlarged but accentuated by the portable technique. New patchy infiltrate in the right mid and lower lung is noted consistent with acute pneumonia. This was not seen on the prior exam. No acute rib abnormality is noted. IMPRESSION: New patchy right mid and lower lung infiltrate. Electronically Signed   By: Inez Catalina M.D.   On: 10/31/2020 09:41   VAS US DUPLEX DIALYSIS ACCESS (AVF, AVG)  Result Date: 10/31/2020 DIALYSIS ACCESS Reason for Exam: Bacteremia. Access Site: Left Upper Extremity. Access Type: Basilic vein transposition. History: Fever, rule out abscess. Left AVF pseudoaneurysm repair 12/22/2017. Limitations: bandages, positioning, patient cooperation, patient receiving              bedside hemodialysis during examination. Comparison Study: No prior study Performing Technologist: Sharion Dove RVS Supporting Technologist: Maudry Mayhew MHA, RDMS, RVT, RDCS  Examination Guidelines: A complete evaluation includes B-mode imaging,  spectral Doppler, color Doppler, and power Doppler as needed of all accessible portions of each vessel. Unilateral testing is considered an integral part of a complete examination. Limited examinations for reoccurring indications may be performed as noted.  Findings: +--------------------+----------+-----------------+--------+ AVF                 PSV (cm/s)Flow Vol (mL/min)Comments +--------------------+----------+-----------------+--------+ Native artery inflow   125          1816                +--------------------+----------+-----------------+--------+ AVF Anastomosis        132                              +--------------------+----------+-----------------+--------+  +------------+----------+-------------+----------+----------+ OUTFLOW VEINPSV (cm/s)Diameter (cm)Depth (cm) Describe  +------------+----------+-------------+----------+----------+ Prox UA        218        0.74                          +------------+----------+-------------+----------+----------+ Dist UA                   2.40               aneurysmal +------------+----------+-------------+----------+----------+ AC Fossa       372        0.78                          +------------+----------+-------------+----------+----------+   Summary: Arteriovenous fistula-Aneurysmal dilatation noted.  *See table(s) above for measurements and observations.  Diagnosing physician: Deitra Mayo MD Electronically signed by Deitra Mayo MD on 10/31/2020 at 5:59:08 PM.    --------------------------------------------------------------------------------   Final    ECHOCARDIOGRAM COMPLETE  Result Date: 10/31/2020    ECHOCARDIOGRAM REPORT   Patient Name:   OLA FAWVER Date of Exam: 10/31/2020 Medical Rec #:  [id number]         Height:       71.0 in Accession #:    [id number]        Weight:       182.5 lb Date of Birth:  09/17/1961  BSA:          2.028 m Patient Age:    6 years          BP:            112/74 mmHg Patient Gender: M                 HR:           98 bpm. Exam Location:  Inpatient Procedure: 2D Echo, 3D Echo, Strain Analysis, Color Doppler and Cardiac Doppler Indications:    Acute myocardial infarction 410  History:        Patient has prior history of Echocardiogram examinations, most                 recent 11/13/2012. CHF, CAD and Acute MI, Arrythmias:Atrial                 Fibrillation; Risk Factors:Hypertension and Dyslipidemia.                 Chronic kidney disease. Ascending AoTAA. Septic shock due to                 Staphylococcus aureus.  Sonographer:    [name] RDCS Referring Phys: [id number] [facility]  Sonographer Comments: Global longitudinal strain was attempted. Cardioversion performed before echocardiogram IMPRESSIONS  1. Left ventricular ejection fraction, by estimation, is 35 to 40%. The left ventricle has moderately decreased function. The left ventricle demonstrates regional wall motion abnormalities (see scoring diagram/findings for description). There is moderate concentric left ventricular hypertrophy. Left ventricular diastolic parameters are consistent with Grade III diastolic dysfunction (restrictive). Elevated left atrial pressure. There is severe hypokinesis of the left ventricular, mid-apical inferolateral wall and inferior wall. There is moderate hypokinesis of the left ventricular, apical septal wall, anterior wall and apical segment.  2. Right ventricular systolic function is normal. The right ventricular size is normal. There is mildly elevated pulmonary artery systolic pressure. The estimated right ventricular systolic pressure is 66.0 mmHg.  3. Left atrial size was severely dilated.  4. Right atrial size was moderately dilated.  5. By PISA, effective regurgitant orifice area is 0.4 cm, the regurgitant volume is 55 ml, regurgitant fraction 40%. The mitral valve is normal in structure. Moderate to severe mitral valve regurgitation.  6. Tricuspid valve  regurgitation is mild to moderate.  7. The aortic valve is tricuspid. There is mild calcification of the aortic valve. There is moderate thickening of the aortic valve. Aortic valve regurgitation is mild. Mild to moderate aortic valve sclerosis/calcification is present, without any evidence of aortic stenosis.  8. Aortic dilatation noted. There is mild dilatation of the ascending aorta, measuring 41 mm.  9. The inferior vena cava is dilated in size with <50% respiratory variability, suggesting right atrial pressure of 15 mmHg. Conclusion(s)/Recommendation(s): No evidence of valvular vegetations on this transthoracic echocardiogram. Would recommend a transesophageal echocardiogram to exclude infective endocarditis if clinically indicated. FINDINGS  Left Ventricle: Left ventricular ejection fraction, by estimation, is 35 to 40%. The left ventricle has moderately decreased function. The left ventricle demonstrates regional wall motion abnormalities. Severe hypokinesis of the left ventricular, mid-apical inferolateral wall and inferior wall. Moderate hypokinesis of the left ventricular, apical septal wall, anterior wall and apical segment. The left ventricular internal cavity size was normal in size. There is moderate concentric left ventricular hypertrophy. Left ventricular diastolic parameters are consistent with Grade III diastolic dysfunction (restrictive). Elevated left atrial pressure. Right Ventricle: The right ventricular size is  normal. No increase in right ventricular wall thickness. Right ventricular systolic function is normal. There is mildly elevated pulmonary artery systolic pressure. The tricuspid regurgitant velocity is 2.66  m/s, and with an assumed right atrial pressure of 15 mmHg, the estimated right ventricular systolic pressure is 60.6 mmHg. Left Atrium: Left atrial size was severely dilated. Right Atrium: Right atrial size was moderately dilated. Pericardium: There is no evidence of pericardial  effusion. Mitral Valve: By PISA, effective regurgitant orifice area is 0.4 cm, the regurgitant volume is 55 ml, regurgitant fraction 40%. The mitral valve is normal in structure. There is mild thickening of the mitral valve leaflet(s). Mild to moderate mitral annular calcification. Moderate to severe mitral valve regurgitation, with eccentric posteriorly directed jet. Tricuspid Valve: The tricuspid valve is normal in structure. Tricuspid valve regurgitation is mild to moderate. Aortic Valve: The aortic valve is tricuspid. There is mild calcification of the aortic valve. There is moderate thickening of the aortic valve. Aortic valve regurgitation is mild. Mild to moderate aortic valve sclerosis/calcification is present, without any evidence of aortic stenosis. Pulmonic Valve: The pulmonic valve was grossly normal. Pulmonic valve regurgitation is not visualized. Aorta: Aortic dilatation noted. There is mild dilatation of the ascending aorta, measuring 41 mm. Venous: The inferior vena cava is dilated in size with less than 50% respiratory variability, suggesting right atrial pressure of 15 mmHg. IAS/Shunts: No atrial level shunt detected by color flow Doppler. Additional Comments: A is visualized in the right ventricle.  LEFT VENTRICLE PLAX 2D LVIDd:         5.30 cm LVIDs:         3.70 cm LV PW:         1.40 cm LV IVS:        1.80 cm LVOT diam:     2.20 cm LV SV:         79 LV SV Index:   39 LVOT Area:     3.80 cm  LV Volumes (MOD) LV vol d, MOD A2C: 253.0 ml LV vol d, MOD A4C: 227.0 ml LV vol s, MOD A2C: 148.0 ml LV vol s, MOD A4C: 143.0 ml LV SV MOD A2C:     105.0 ml LV SV MOD A4C:     227.0 ml LV SV MOD BP:      98.1 ml RIGHT VENTRICLE RV S prime:     12.00 cm/s TAPSE (M-mode): 1.5 cm LEFT ATRIUM              Index       RIGHT ATRIUM           Index LA diam:        4.90 cm  2.42 cm/m  RA Area:     20.40 cm LA Vol (A2C):   109.0 ml 53.74 ml/m RA Volume:   59.30 ml  29.24 ml/m LA Vol (A4C):   116.0 ml 57.19  ml/m LA Biplane Vol: 114.0 ml 56.21 ml/m  AORTIC VALVE LVOT Vmax:   121.00 cm/s LVOT Vmean:  95.100 cm/s LVOT VTI:    0.207 m  AORTA Ao Root diam: 3.20 cm Ao Asc diam:  4.10 cm MITRAL VALVE                 TRICUSPID VALVE MV Area (PHT): 6.96 cm      TR Peak grad:   28.3 mmHg MV Decel Time: 109 msec      TR Vmax:        266.00  cm/s MR Peak grad:    75.9 mmHg MR Mean grad:    50.0 mmHg   SHUNTS MR Vmax:         435.50 cm/s Systemic VTI:  0.21 m MR Vmean:        333.0 cm/s  Systemic Diam: 2.20 cm MR PISA:         6.28 cm MR PISA Eff ROA: 51 mm MR PISA Radius:  1.00 cm MV E velocity: 145.33 cm/s Dani Gobble Croitoru MD Electronically signed by Sanda Klein MD Signature Date/Time: 10/31/2020/2:50:12 PM    Final     Cardiac Studies    Cath PCI 11/20: SUMMARY  Acute anterolateral ST elevation MI with Culprit lesion being the 99% mid LAD lesion (just past major 1st Diag/D1) along with multivessel CAD including 99% ostial AV groove LCx and 80% calcified napkin ring proximal RCA lesion with extensive calcification throughout the RCA. ? Successful PTCA and DES PCI of the LAD crossing D1 -resolute Onyx DES 2.75 mm x 18 mm postdilated to 3.1 mm. ? With PCI, there was stabilization of significant ectopy, AIVR and PVCs  Mild to moderate reduced EF with anterior anterolateral hypokinesis, EF roughly 40 to 45%.  Initial evaluation suggested normal EDP, but on recheck post PCI LVEDP of 30 mmHg, severely elevated consistent with ACUTE DIASTOLIC HEART FAILURE  Significantly dilated aortic root requiring AL-1 guide catheter for both Left and Right Coronary Angiography.   RECOMMENDATIONS  Admit to CCU, restart IV heparin 8 hours after sheath removal  Check 2D echo for better assessment of EF  Plan to review cath films with other IC cardiologist, anticipate staged PCI of at least the RCA which may require atherectomy versus lithotripsy, and likely the ostial AV groove circumflex.  If he has signs of dyspnea,  consider earlier dialysis than tomorrow. ->  Would consult for nephrology to see today.  He is already on labetalol and Norvasc.  We will continue current home medications.  Uninterrupted DAPT times X 1 year.   Echo 10/31/2020: EF 35 to 40%.  Moderately decreased function.  Moderate cLVH.  GR 3 DD.  Severe HK of the mid apical inferior wall inferior lateral wall and moderate HK of apical septal anterior and apical wall.  Severe LA dilation and moderate RA dilation.  Moderate to severe MR.  Mild to moderate aortic sclerosis without stenosis.  LFemV Central line placed by [facility] 11/21   Assessment & Plan    Principal Problem:   Acute ST elevation myocardial infarction (STEMI) of anterolateral wall (HCC) Active Problems:   ESRD (end stage renal disease) ([place])   3 V- CAD w/ ACS/STEMI: Culprit = 99% mLAD @ D1 (DES PCI jailing D1); 99% ost-AVGLCx, 80% calcified napkin ring prox RCA.    Presence of drug coated stent in LAD coronary artery: Resolute Onyx DES 2.75 mm x 18 mm (3.1 mm) at major D1 & SP1.   Acute combined systolic and diastolic heart failure (HCC)   Atrial fibrillation with RVR (HCC)   MSSA bacteremia   Septic shock due to Staphylococcus aureus (HCC)   Hyperlipidemia with target LDL less than 70   Essential hypertension   Dyspnea  Principal Problem:   Acute ST elevation myocardial infarction (STEMI) of anterolateral wall (HCC) /  3 V- CAD w/ ACS/STEMI: Culprit = 99% mLAD @ D1 (DES PCI jailing D1); 99% ost-AVGLCx, 80% calcified napkin ring prox RCA. /  Presence of drug coated stent in LAD coronary artery: Resolute Onyx DES 2.75 mm  x 18 mm (3.1 mm) at major D1 & SP1.  PCI of LAD- with planned staged PCI of RCA & AVG Cx-  Will have to put on hold given MSSA bacteremia  Has been on IV heparin for existing CAD and A. Fib -> we will hold for now as he will need dialysis catheter placed.  Also need to confirm no bleeding with drop in hemoglobin.  Continue high-dose statin.  Clearly  with septic shock requiring pressors, not on beta-blocker or CCB.  Continue DAPT aspirin and Brilinta, but will likely switch to aspirin and Plavix as he will likely need warfarin on discharge.    End stage renal disease on dialysis [facility]) - on HD M-W-F (required urgent cath on 11/20 PM 2/2 volume overload & Acute DHF- 4 L off). -  Appreciate Nephrology prompt attention this weekend & continued assistance  At this point the plan after last night's events will be CVRRT, needs dialysis catheter placed.  Heparin on hold for procedure.    Acute combined systolic and diastolic heart failure (HCC) (complicated with Volume overload & accelerated HTN) --> resolved with HD on Saturday post Cath.;  EF 35 to 40% on echo now with moderate severe MR.  I suspect some of this is functional.  Can get a better assessment with TEE once more stabilized.  Remains on pressors with inotrope current controlled now by continues dialysis.Marland Kitchen  Hopefully, with restoration of sinus rhythm, cardiac function will stabilize.     Atrial fibrillation with RVR ([place]) - initially had urgent DCCV; initially back to SR shortly & retured to Afib RVR currently in 110-120s --> DCCV successfully on 10/31/2020 with anesthesia.  Converted from amiodarone infusion to oral load  Unfortunately, with drop in hemoglobin we will hold heparin pending evaluation for RP bleed with CT abdomen pelvis.  Once no bleed is confirmed, would restart heparin, and eventually will need warfarin.   With initiation of warfarin, would stop aspirin and convert to Plavix    Bacteremia due to Staphylococcus - MSSA; Septic shock due to Staphylococcus aureus (HCC)/staph pneumonia- PCCM consult & ID counsulted -> WBC elevated Febrile to 102.5 on 11/21 PM - current T max 99.8; Chest x-ray now shows clear evidence of right lower lobe pneumonia.  Concerning for "metastatic staph bacteremia"   2/2 BC + for MSSA = started on Vanc-Zosyn -remains on cefazolin.  Now  with staph pneumonia, will have at least 4 weeks of antibiotics.  Remains pressor dependent with increased dose of Levophed and vasopressin allergies intubated and sedated.  TTE done, no sign of obvious vegetation, will need TEE which should be easier now that he is intubated.  Antibiotics management by PCCM/ID.      Hyperlipidemia with target LDL less than 70:  High dose statin started.      Essential hypertension -remains on pressors.    Anemia - Hgb 9.5 on admission, currently stable at 6.1.  Still no sign of active bleeding..; on ESA per Nephrology  Transfusing 1 unit now.  May need more.  This drop in hemoglobin is concerning that we need to check for RP bleed.  We will order CT abdomen pelvis.   Patient is very complex patient with multisystem organ failure: CAD, CHF, MR, staph sepsis with pneumonia and septic shock as well as anemia in ESRD. Critical care time: 40 minutes with patient and in discussion with wife, 30 minutes per chart. Total 1:10 hour.   For questions or updates, please contact [place] Please consult  [email or web address] for contact info under        Signed, Glenetta Hew, MD  11/01/2020, 9:43 AM          [facility] has been requested to perform a transesophageal echocardiogram on Travis [name] for bacteremia and mitral regurgitation.  After careful review of history and examination, the risks and benefits of transesophageal echocardiogram have been explained including risks of esophageal damage, perforation (1:10,000 risk), bleeding, pharyngeal hematoma as well as other potential complications associated with conscious sedation including aspiration, arrhythmia, respiratory failure and death. Alternatives to treatment were discussed, questions were answered. Patient is willing to proceed.   Exam notable for:  Intubated and sedated; bottom teeth with no loose teeth, Mallampati II.R IJ VascCath; coarse breath sounds bilaterally   Has Dilauded  and Propofol on for baseline sedation, GCS < 8 with this sedation.  Werner Lean, MD  11/01/2020 2:01 PM

## 2020-11-01 NOTE — Progress Notes (Signed)
Initial Nutrition Assessment  DOCUMENTATION CODES:   Not applicable  INTERVENTION:   Tube feeding:  -Vital AF 1.2 @ 50 ml/hr via OG (once advanced into stomach) -ProSource TF 45 ml QID -B complex with Vitamin C to account for losses with CRRT  Provides: 1600 kcals (1996 kcal with propofol), 134 grams protein, 973 ml free water.   NUTRITION DIAGNOSIS:   Increased nutrient needs related to acute illness as evidenced by estimated needs.  GOAL:   Patient will meet greater than or equal to 90% of their needs  MONITOR:   Vent status, Skin, TF tolerance, Weight trends, Labs, I & O's  REASON FOR ASSESSMENT:   Ventilator    ASSESSMENT:   Patient with PMH significant for HTN and ESRD in HD. Presents this admission with STEMI.   11/20- s/p PCI to LAD 11/23- dyspnea/agitation, intubated   Pt discussed during ICU rounds and with RN.   Requiring pressors. On propofol. Started CRRT today. OG tip located in esophagus per xray. Reached out to CCM regarding plan for feeding, awaiting response.   Records lack weight history over the last year.   EDW: 81.5 kg  Current weight: 82.8 kg   Patient is currently intubated on ventilator support MV: 10.8 L/min Temp (24hrs), Avg:98.2 F (36.8 C), Min:97.8 F (36.6 C), Max:98.6 F (37 C)  Propofol: 15 ml/hr- provides 396 kcal from lipids daily    Drips: levophed, propofol, vasopressin Medications: colace, hectorol, miralax Labs: Phosphorus 8.3 (H) Mg 2.8 (H)   Diet Order:   Diet Order            Diet Heart Room service appropriate? Yes; Fluid consistency: Thin  Diet effective now                 EDUCATION NEEDS:   Not appropriate for education at this time  Skin:  Skin Assessment: Reviewed RN Assessment  Last BM:  11/20  Height:   Ht Readings from Last 1 Encounters:  10/28/20 5\' 11"  (1.803 m)    Weight:   Wt Readings from Last 1 Encounters:  10/29/20 82.8 kg    BMI:  Body mass index is 25.46  kg/m.  Estimated Nutritional Needs:   Kcal:  1863 kcal  Protein:  125-150 g  Fluid:  1000 ml + UOP (liberalized with CRRT)  Mariana Single RD, LDN Clinical Nutrition Pager listed in [place]

## 2020-11-01 NOTE — Procedures (Signed)
Central Venous Catheter Insertion Procedure Note  Travis [name]  [id number]  1961/05/08  Date:11/01/20  Time:12:02 PM   Provider Performing:Kendarius Vigen   Procedure: Insertion of Non-tunneled Central Venous Catheter(36556)with US guidance (41364)    Indication(s) Hemodialysis  Consent Risks of the procedure as well as the alternatives and risks of each were explained to the patient and/or caregiver.  Consent for the procedure was obtained and is signed in the bedside chart  Anesthesia Topical only with 1% lidocaine   Timeout Verified patient identification, verified procedure, site/side was marked, verified correct patient position, special equipment/implants available, medications/allergies/relevant history reviewed, required imaging and test results available.  Sterile Technique Maximal sterile technique including full sterile barrier drape, hand hygiene, sterile gown, sterile gloves, mask, hair covering, sterile ultrasound probe cover (if used).  Procedure Description Area of catheter insertion was cleaned with chlorhexidine and draped in sterile fashion.   With real-time ultrasound guidance a HD catheter was placed into the right internal jugular vein.  Nonpulsatile blood flow and easy flushing noted in all ports.  The catheter was sutured in place and sterile dressing applied.   14 F 15cm catheter inserted. Single pass.     Complications/Tolerance None; patient tolerated the procedure well. Chest X-ray is ordered to verify placement for internal jugular or subclavian cannulation.  Chest x-ray is not ordered for femoral cannulation.  EBL Minimal  Kipp Brood, MD [facility] ICU Physician [place]  Pager: [phone] Or Epic Secure Chat After hours: [phone].  11/01/2020, 12:04 PM

## 2020-11-01 NOTE — Progress Notes (Signed)
[facility] Progress Note   Assessment/ Plan:   OP HD:AF MWF 4h 450/800 81.5kg 2/2.25 bath RUE AVF Hep 5000+ 1070mdrun - hect 5 ug tiw  - mircera 50 ug q 4, last 11/15 (due 11/29)    Assessment/ Plan: 1. Acute STEMI - SP LAD PCI 11/20 am, has other disease in LCx/ RCA. Per cardiology. 2. SOB/Acute hypoxic RF - had pulm edema by CXR w/ SOB / coughing. Initially better but had more tachypnea and coughing yesterday 11/23, had CT chest with multifocal PNA and likely cavitary lesion.  Intubated 11/23.  Per PCCM 3. PEA arrest: occurred in setting of agitation/ sedation.  ROSC with 1 mg epi/ CPR/ 1 min. 4. MSSA bacteremia- on cefazolin  5. ESRD - usual HD MWF. Had LHC w/ high LVEDP +35 mmHg. Lowering vol w/ HD as tolerated. Pressor requirement increasing, now on 20 mcg of levophed.  Will not be able to safely IHD--> will start CRRT, all 4K bath, no heparin.  Discussed with PCCM, appreciate assistance with line.   6. Shock: presumably septic+/- cardiogenic, pressors per PCCM 7. 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 Afib: on amio and hep gtt, s/p DCCV  Subjective:    Eventful day yesterday.  Intubated yesterday around 6 pm. Agitated overnight, had PEA arrest for about 1 min as well.  Pressor requirement increasing, up to 20 mcg levophed.  Will need CRRT.  Wife Myra updated at bedside with sister JKennyth Loseon the phone.    Objective:   BP 92/64   Pulse 74   Temp 98.6 F (37 C) (Oral)   Resp 16   Ht 5' 11"  (1.803 m)   Wt 82.8 kg   SpO2 100%   BMI 25.46 kg/m   Physical Exam: GELF:YBOFBin bed, intubated, sedated CPZW:CHENIDPOEUM no m/r/g Resp: tachypneic Abd: soft Ext: no gross edema ACCESS: LUE AVF + T/B  Labs: BMET Recent Labs  Lab 10/28/20 0604 10/28/20 0604 10/29/20 0101 10/29/20 0101 10/29/20 2121 10/29/20 2121 10/29/20 2128 10/30/20 0110 10/30/20 0548 10/31/20 0830 10/31/20 1811  11/01/20 0336 11/01/20 0430  NA 133*   < > 132*   < > 132*   < > 126* 125* 125* 136 134* 134* 137  K 3.7   < > 5.1   < > 3.1*   < > 4.8 4.9 5.2* 5.1 5.0 5.7* 5.0  CL 86*  --  88*  --  99  --   --   --  86* 93*  --   --  95*  CO2 31  --  28  --  20*  --   --   --  22 26  --   --  23  GLUCOSE 140*  --  143*  --  208*  --   --   --  148* 118*  --   --  169*  BUN 30*  --  33*  --  48*  --   --   --  75* 52*  --   --  82*  CREATININE 9.73*  --  7.75*  --  7.33*  --   --   --  11.18* 8.19*  --   --  10.59*  CALCIUM 9.8  --  9.7  --  6.5*  --   --   --  9.1 8.9  --   --  8.7*  PHOS  --   --   --   --   --   --   --   --   --  4.9*  --   --  8.3*   < > = values in this interval not displayed.   CBC Recent Labs  Lab 10/28/20 0604 10/29/20 0101 10/29/20 2121 10/29/20 2128 10/30/20 0101 10/30/20 0110 10/30/20 0259 10/30/20 0259 10/31/20 0030 10/31/20 1811 11/01/20 0336 11/01/20 0430  WBC 12.7*   < > 12.9*   < > 20.4*  --  20.9*  --  16.3*  --   --  14.7*  NEUTROABS 11.4*  --  11.8*  --   --   --   --   --   --   --   --   --   HGB 9.5*   < > 6.0*   < > 8.1*   < > 8.0*   < > 8.0* 6.8* 6.8* 6.1*  HCT 29.5*   < > 18.8*   < > 24.6*   < > 24.5*   < > 25.1* 20.0* 20.0* 19.3*  MCV 95.5   < > 93.5   < > 92.8  --  92.8  --  93.3  --   --  96.5  PLT 143*   < > 86*   < > 126*  --  119*  --  128*  --   --  234   < > = values in this interval not displayed.      Medications:    . sodium chloride   Intravenous Once  . amiodarone  400 mg Oral BID  . aspirin  81 mg Oral Daily  . atorvastatin  80 mg Oral q1800  . chlorhexidine gluconate (MEDLINE KIT)  15 mL Mouth Rinse BID  . Chlorhexidine Gluconate Cloth  6 each Topical Q0600  . docusate  100 mg Per Tube BID  . doxercalciferol  5 mcg Intravenous Q M,W,F-HD  . famotidine  10 mg Oral Daily  . ferric citrate  210 mg Oral With snacks  . ferric citrate  630 mg Oral TID with meals  . lidocaine  1 patch Transdermal Q24H  . mouth rinse  15 mL Mouth  Rinse 10 times per day  . mupirocin ointment  1 application Nasal BID  . polyethylene glycol  17 g Per Tube Daily  . sodium chloride flush  10-40 mL Intracatheter Q12H  . sodium chloride flush  3 mL Intravenous Q12H  . Thrombi-Pad  1 each Topical Once  . ticagrelor  90 mg Oral BID     Madelon Lips MD 11/01/2020, 9:12 AM

## 2020-11-01 NOTE — Progress Notes (Signed)
eLink Physician-Brief Progress Note Patient Name: Travis [name] DOB: 20-Oct-1961 MRN: [id number]   Date of Service  11/01/2020  HPI/Events of Note  Notified of Hgb 6.1. No sign of acute bleed  eICU Interventions  Ordered to transfuse 1 unit PRBC slowly. May transfuse faster during dialysis     Intervention Category Major Interventions: Other:  Judd Lien 11/01/2020, 6:18 AM

## 2020-11-01 NOTE — Progress Notes (Signed)
eLink Physician-Brief Progress Note Patient Name: Travis [name] DOB: 1961-04-18 MRN: [id number]   Date of Service  11/01/2020  HPI/Events of Note  Called to camera due to agitation despite Dilaudid 1 and Precedex 1. He was given a total of dilaudid 5 mg IV with drip increased to 4 mg. Precedex increased to 1.4.  Synchronous on vent, peak pressure not elevated  eICU Interventions  Not long after camera in patient became hypotensive and bradycardic and went into asystole. CPR ensued. Given 1 of epi and 0.4 Narcan with ROSC and patient was purposeful trying to grab ETT. Ordered Versed 2 mg, patient remains awake but did calm down. Propofol to be started and prn Versed ordered. CXR stat, labs drawn early.  ABG 7.29/39/71, K 5.7 Ordered calcium, D50/insulin and bicarb push  Called wife to update her but went to voicemail. Will keep trying.     Intervention Category Major Interventions: Code management / supervision Minor Interventions: Agitation / anxiety - evaluation and management  Judd Lien 11/01/2020, 3:29 AM

## 2020-11-01 NOTE — Procedures (Signed)
    TRANSESOPHAGEAL ECHOCARDIOGRAM   NAME:  NICOLIS BOODY    MRN: [id number] DOB:  1961-08-11    ADMIT DATE: 10/28/2020  INDICATIONS: Infective endocarditis  PROCEDURE:   Informed consent was obtained prior to the procedure. The risks, benefits and alternatives for the procedure were discussed and the patient comprehended these risks.  Risks include, but are not limited to, cough, sore throat, vomiting, nausea, somnolence, esophageal and stomach trauma or perforation, bleeding, low blood pressure, aspiration, pneumonia, infection, trauma to the teeth and death.    Procedural time out performed. The oropharynx was anesthetized with topical 1% cetacaine.    Patient maintained on opioids and propofol drips  The patient's heart rate, blood pressure, and oxygen saturation are monitored continuously during the procedure. The period of conscious sedation is 20 minutes, of which I was present face-to-face 100% of this time.   The transesophageal probe was inserted in the esophagus and stomach without difficulty and multiple views were obtained.   Prior to probe removal, orogastric feeding tube was lubricated, then advanced without complication.  COMPLICATIONS:    There were no immediate complications.  KEY FINDINGS:  1. Severe Mitral Regurgitation 2. Echodensity noted on the aortic valve consistent calcification (on the aortic side of the aortic valve) 3. Moderate aortic regurgitation 4. Full report to follow. 5. Further management per primary team.   Rudean Haskell, [place]  2:55 PM

## 2020-11-01 NOTE — Progress Notes (Signed)
[place] for Heparin Indication: chest pain/ACS  Allergies  Allergen Reactions  . Ibuprofen Hives  . Lisinopril Swelling    PT states he is allergic to all prils; caused facial swelling  . Naproxen Hives and Other (See Comments)    Alleve causes patient to have hives    Patient Measurements: Height: 5\' 11"  (180.3 cm) Weight: 82.8 kg (182 lb 8.7 oz) IBW/kg (Calculated) : 75.3 Heparin Dosing Weight: 86.4 kg   Vital Signs: Temp: 98.2 F (36.8 C) (11/24 1200) Temp Source: Oral (11/24 1200) BP: 100/69 (11/24 1200) Pulse Rate: 74 (11/24 1200)  Labs: Recent Labs    10/29/20 2121 10/29/20 2121 10/29/20 2128 10/30/20 0259 10/30/20 0548 10/30/20 1250 10/31/20 0000 10/31/20 0030 10/31/20 0030 10/31/20 0448 10/31/20 0830 10/31/20 1526 10/31/20 1811 10/31/20 1811 11/01/20 0336 11/01/20 0430  HGB 6.0*   < >   < > 8.0*  --   --   --  8.0*   < >  --   --   --  6.8*   < > 6.8* 6.1*  HCT 18.8*   < >   < > 24.5*  --   --   --  25.1*   < >  --   --   --  20.0*  --  20.0* 19.3*  PLT 86*  --    < > 119*  --   --   --  128*  --   --   --   --   --   --   --  234  LABPROT  --   --   --   --  15.0  --   --   --   --   --   --   --   --   --   --   --   INR  --   --   --   --  1.2  --   --   --   --   --   --   --   --   --   --   --   HEPARINUNFRC  --   --   --  0.10*  --    < >   < >  --   --  0.36  --  0.44  --   --   --  0.49  CREATININE 7.33*   < >  --   --  11.18*  --   --   --   --   --  8.19*  --   --   --   --  10.59*  TROPONINIHS >27,000*  --   --   --  >27,000*  --   --   --   --   --   --   --   --   --   --   --    < > = values in this interval not displayed.    Estimated Creatinine Clearance: 8.1 mL/min (A) (by C-G formula based on SCr of 10.59 mg/dL (H)).    Assessment: 80 yom presenting with STEMI. Known to have dilated ascending aorta, ESRD. Found to have 99% mid LAD with multivessel in LCx and RCA, now s/p DES to LAD. Heparin  was started post sheath removal. Patient was found to have bacteremia and will not proceed with cath intervention at this time. Patient developed Afib during admission treated with amiodarone and successful cardioversion. This morning, patient's  Hgb fell to 6.1 requiring 1unit PRBC and patient was scheduled for placement of HD cath in preparation to start CRRT. Heparin was held this morning around 1000 for these reasons. The abdominal CT revealed no bleeding and heparin was restarted around 1300 at 1600units/hr. Important to note patient was previously therapeutic on this dose prior to being stopped. CRRT will begin today.    Goal of Therapy:  Heparin level 0.3-0.7 units/ml Monitor platelets by anticoagulation protocol: Yes   Plan:  Restart heparin at 1600 units/hr 8-hour heparin level and CBC scheduled for 2100  Daily HL and CBC  Monitor for s/sx of bleed   Cephus Slater, PharmD, [facility] Resident [phone] 11/01/2020 1:08 PM

## 2020-11-01 NOTE — Code Documentation (Signed)
  Patient Name: Travis [name]   MRN: [id number]   Date of Birth/ Sex: 01-15-1961 , male      Admission Date: 10/28/2020  Attending Provider: Leonie Man, MD  Primary Diagnosis: Acute ST elevation myocardial infarction (STEMI) of anterolateral wall [facility])   Indication:  Per RN at bedside, patient was increasingly agitated throughout the day on 11/23, later had decreasing O2 saturations from high 80s to high 70s associated with increased confusion and agitation. Patient was subsequently intubated. Early AM on 11/24, patient's Precidex and Dilaudid gtt were increased. RN reports patient was noted to be unresponsive and in asystole. Code blue was subsequently called at 0318. At the time of arrival on scene, ROSC had already been achieved after 4 minutes of compressions, epi x 1 and narcan x1.   Technical Description:  - CPR performance duration:  4 minutes  - Was defibrillation or cardioversion used? No   - Was external pacer placed? Yes  - Was patient intubated pre/post CPR? Intubated prior to code   Medications Administered: Y = Yes; Blank = No Amiodarone    Atropine    Calcium    Epinephrine  Y  Lidocaine    Magnesium    Norepinephrine    Phenylephrine    Sodium bicarbonate    Vasopressin     Post CPR evaluation:  - Final Status - Was patient successfully resuscitated ? Yes - What is current rhythm? NSR - What is current hemodynamic status? stable  Miscellaneous Information:  - Labs sent, including: CBC, Mg, triglyceride, I-stat 7, heparin level  - Primary team notified?  Virtually at bedside  - Family Notified? Per primary  - Additional notes/ transfer status:  Currently in the ICU     Alexandria Lodge, MD  11/01/2020, 3:39 AM

## 2020-11-01 NOTE — Progress Notes (Signed)
NAME:  Travis [name], MRN:  [id number], DOB:  04/09/1961, LOS: 4 ADMISSION DATE:  10/28/2020, CONSULTATION DATE:  10/29/20 REFERRING MD:  Cardiology - [name], CHIEF COMPLAINT:  Hypotension, gram positive cocci on culture, AMS  Brief History   Travis [name] is a 59 year old man with a history of HTN, ESRD on HD MWF, RUE AV fistula, hx of smoking, admitted 10/28/20 with fevers, myalgias for several days, acute chest pain.    History of present illness   On Admission, found to have STEMI, multivessel disease. Pulm edema with mild hypoxemia.  S/Post PCI to the LAD on 10/29/19. Planning for staged intervention to the left circumflex and possibly right coronary artery.  EF 21-19%, diastolic dysfunction, LVEDP 35. Underwent HD on 11/20 with 4 L volume removed.  Saturation improved after dialysis (100% on RA) Fevers noted 11/20. Blood cultures grew MSSA, noted this evening. .    This evening developed Afib with RVR 180s. Adenosine given, without improvement  Amiodarone infusion started per cardiology, 150mg  bolus, then drip.  Lopressor 5mg IV and 500ccNS given. Started on Phenylephrine, was on 271mcg on my arrival.   Cardioverted emergently at 120J for ongoing afib with hypotension (60/40) and AMS.  Converted to sinus.  Remained moderately hypotensive (MAP 60) on phenylephrine.   Patient was given one dose zosyn, switched to ancef once cultures grew back MSSA.   Cardiac stress tests annually, last done 11/16/19.    Past Medical History  HTN ESRD, HD MWF  Significant Hospital Events   Cardioversion 11/21 Cardiac cath stent to LAD 11/20  Consults:  PCCM  Nephrology  Procedures:  Central line 11/21  Arterial line 11/21 Repeat DCCV 11/23 HD line 11/24 Arterial line 11/24  Significant Diagnostic Tests:  11/23 Echo Pending 11/22 LUE Vas Upper Extremity Doppler>> Arteriovenous fistula-Aneurysmal dilatation noted. 11/24 CT abdomen - no retroperitoneal hematoma 11/24 transthoracic  echocardiogram-severe LV dysfunction EF 35% with restrictive diastolic function severe inferior wall motion abnormalities.  Moderate to severe mitral regurgitation. Micro Data:   11/21 Staph aureus > MSSA by BCID >>> 11/22 Cultures negative. Antimicrobials:  Zosyn 11/21 x 1 Ancef 11/21 ->  Interim history/subjective:   Intubated yesterday for increasing dyspnea and agitation. Brief arrest following repeated dosing of Dilaudid for agitation. Has been calm since Precedex switched to Propofol.  Objective   Blood pressure 102/69, pulse 74, temperature 98.6 F (37 C), temperature source Oral, resp. rate 18, height 5\' 11"  (1.803 m), weight 82.8 kg, SpO2 100 %.    Vent Mode: PRVC FiO2 (%):  [60 %-100 %] 80 % Set Rate:  [15 bmp] 15 bmp Vt Set:  [600 mL] 600 mL PEEP:  [10 cmH20] 10 cmH20 Plateau Pressure:  [21 cmH20-31 cmH20] 21 cmH20   Intake/Output Summary (Last 24 hours) at 11/01/2020 1206 Last data filed at 11/01/2020 0813 Gross per 24 hour  Intake 1663.76 ml  Output --  Net 1663.76 ml   Filed Weights   10/28/20 0945 10/28/20 1400 10/29/20 1630  Weight: 86.4 kg 86.4 kg 82.8 kg    Examination:  General: middle aged adult male, intubated and sedated.  HENT: ETT tube in place. Minimal secretions.  Lungs: no ventilator dyssynchrony. Normal vesicular breath sounds throughout. Cardiovascular: in sinus rhythm with warm extremities. Capillary refill 3s.  Abdomen: soft with no distention.  Extremities: no acute deformity or ROM limitation. No edema.  Neuro: sedated with. Will cough and move with stimulation.   Resolved Hospital Problem list     Assessment & Plan:  Critically ill due to severe sepsis with septic shock due to MSSA bacteremia requiring titration of vasopressors. -Titrate norepinephrine and vasopressin to keep MAP greater than 65 -Transesophageal echo to better define cardiac component -SCV O2 to evaluate adequacy of resuscitation -Consider Flowtrack use  MSSA  bacteremia likely from HD access. No signs of PNA, no evidence of AV fistula infection.   - Repeat cultures Pending 11/23 - ID consult pending per protocol - Doppler L  HD fistula>> Some AV dilation notes.  - Will need TEE   Afib now cardioverted to sinus rhythm on amiodarone - Cont amiodarone  STEMI/CAD. On Heparin, brillinta, asa.   - Will eventually need a return to cath lab for staged PCI  Hyponatremia - nephrology following, last HD 11/22 - Trend BMET  ESRD:  -Start CRRT today  Critically ill due to acute hypoxic respiratory failure requiring mechanical ventilation secondary to staph aureus pneumonia due to hematogenous spread as seen on CT scan. -Full ventilatory support -We will need 4 to 6 weeks of IV antibiotic therapy.  Best practice:  Diet: NPO May need fluoroscopically guided OGT Pain/Anxiety/Delirium protocol (if indicated): Propofol and hydromorphone infusions to RASS -3 VAP protocol (if indicated): Bundle in place DVT prophylaxis: on heparin gtt GI prophylaxis: famotidine Glucose control: Phase 1 glycemic control Mobility: Bedrest Code Status: Full  Family Communication: Spoke with wife  at bedside 11/24 Disposition: ICU  Labs   CBC: Recent Labs  Lab 10/28/20 0604 10/29/20 0101 10/29/20 2121 10/29/20 2128 10/30/20 0101 10/30/20 0110 10/30/20 0259 10/31/20 0030 10/31/20 1811 11/01/20 0336 11/01/20 0430  WBC 12.7*   < > 12.9*  --  20.4*  --  20.9* 16.3*  --   --  14.7*  NEUTROABS 11.4*  --  11.8*  --   --   --   --   --   --   --   --   HGB 9.5*   < > 6.0*   < > 8.1*   < > 8.0* 8.0* 6.8* 6.8* 6.1*  HCT 29.5*   < > 18.8*   < > 24.6*   < > 24.5* 25.1* 20.0* 20.0* 19.3*  MCV 95.5   < > 93.5  --  92.8  --  92.8 93.3  --   --  96.5  PLT 143*   < > 86*  --  126*  --  119* 128*  --   --  234   < > = values in this interval not displayed.    Basic Metabolic Panel: Recent Labs  Lab 10/29/20 0101 10/29/20 0101 10/29/20 2121 10/29/20 2128  10/30/20 0548 10/31/20 0830 10/31/20 1811 11/01/20 0336 11/01/20 0430  NA 132*   < > 132*   < > 125* 136 134* 134* 137  K 5.1   < > 3.1*   < > 5.2* 5.1 5.0 5.7* 5.0  CL 88*  --  99  --  86* 93*  --   --  95*  CO2 28  --  20*  --  22 26  --   --  23  GLUCOSE 143*  --  208*  --  148* 118*  --   --  169*  BUN 33*  --  48*  --  75* 52*  --   --  82*  CREATININE 7.75*  --  7.33*  --  11.18* 8.19*  --   --  10.59*  CALCIUM 9.7  --  6.5*  --  9.1 8.9  --   --  8.7*  MG  --   --   --   --   --   --   --   --  2.8*  PHOS  --   --   --   --   --  4.9*  --   --  8.3*   < > = values in this interval not displayed.   GFR: Estimated Creatinine Clearance: 8.1 mL/min (A) (by C-G formula based on SCr of 10.59 mg/dL (H)). Recent Labs  Lab 10/30/20 0101 10/30/20 0259 10/30/20 0548 10/30/20 0820 10/31/20 0030 10/31/20 1327 11/01/20 0412 11/01/20 0430 11/01/20 0625  WBC 20.4* 20.9*  --   --  16.3*  --   --  14.7*  --   LATICACIDVEN  --   --    < > 2.1*  --  2.5* 5.5*  --  2.1*   < > = values in this interval not displayed.    Liver Function Tests: Recent Labs  Lab 10/28/20 0604 10/30/20 0548 10/31/20 0830 11/01/20 0430  AST 84* 87*  --   --   ALT 87* 51*  --   --   ALKPHOS 146* 95  --   --   BILITOT 1.2 1.5*  --   --   PROT 6.6 5.6*  --   --   ALBUMIN 3.1* 2.2* 1.9* 1.6*   No results for input(s): LIPASE, AMYLASE in the last 168 hours. No results for input(s): AMMONIA in the last 168 hours.  ABG    Component Value Date/Time   PHART 7.295 (L) 11/01/2020 0336   PCO2ART 39.9 11/01/2020 0336   PO2ART 71 (L) 11/01/2020 0336   HCO3 19.4 (L) 11/01/2020 0336   TCO2 21 (L) 11/01/2020 0336   ACIDBASEDEF 7.0 (H) 11/01/2020 0336   O2SAT 82.0 11/01/2020 1130     Coagulation Profile: Recent Labs  Lab 10/28/20 0629 10/30/20 0548  INR 1.3* 1.2    Cardiac Enzymes: No results for input(s): CKTOTAL, CKMB, CKMBINDEX, TROPONINI in the last 168 hours.  HbA1C: Hgb A1c MFr Bld   Date/Time Value Ref Range Status  10/28/2020 06:04 AM 4.3 (L) 4.8 - 5.6 % Final    Comment:    (NOTE) Pre diabetes:          5.7%-6.4%  Diabetes:              >6.4%  Glycemic control for   <7.0% adults with diabetes   01/30/2015 09:09 PM 5.1 4.8 - 5.6 % Final    Comment:    (NOTE)         Pre-diabetes: 5.7 - 6.4         Diabetes: >6.4         Glycemic control for adults with diabetes: <7.0     CBG: Recent Labs  Lab 10/28/20 1120 11/01/20 0330 11/01/20 0502  GLUCAP 111* 108* 106*    Critical care time: 45 minutes     Kipp Brood, MD [facility] ICU Physician [place]  Pager: [phone] Or Epic Secure Chat After hours: [phone].  11/01/2020, 12:35 PM

## 2020-11-01 NOTE — Progress Notes (Signed)
[facility] for Heparin Indication: chest pain/ACS  Allergies  Allergen Reactions  . Ibuprofen Hives  . Lisinopril Swelling    PT states he is allergic to all prils; caused facial swelling  . Naproxen Hives and Other (See Comments)    Alleve causes patient to have hives    Patient Measurements: Height: 5\' 11"  (180.3 cm) Weight: 82.8 kg (182 lb 8.7 oz) IBW/kg (Calculated) : 75.3 Heparin Dosing Weight: 86.4 kg   Vital Signs: Temp: 97.8 F (36.6 C) (11/24 1541) Temp Source: Oral (11/24 1541) BP: 112/72 (11/24 1500) Pulse Rate: 66 (11/24 1900)  Labs: Recent Labs    10/30/20 0548 10/30/20 1250 10/31/20 0030 10/31/20 0448 10/31/20 1526 10/31/20 1811 11/01/20 0430 11/01/20 0430 11/01/20 1454 11/01/20 1514 11/01/20 2045 11/01/20 2048 11/01/20 2059  HGB  --   --  8.0*  --   --    < > 6.1*   < > 7.3*  --  7.6*  --   --   HCT  --   --  25.1*  --   --    < > 19.3*  --  21.7*  --  21.6*  --   --   PLT  --   --  128*  --   --   --  234  --   --   --  291  --   --   LABPROT 15.0  --   --   --   --   --   --   --   --   --   --   --   --   INR 1.2  --   --   --   --   --   --   --   --   --   --   --   --   HEPARINUNFRC  --    < >  --    < > 0.44  --  0.49  --   --   --   --   --  0.47  CREATININE 11.18*  --   --    < >  --   --  10.59*  --   --  10.91*  --  8.15*  --   TROPONINIHS >27,000*  --   --   --   --   --   --   --   --   --   --   --   --    < > = values in this interval not displayed.    Estimated Creatinine Clearance: 10.5 mL/min (A) (by C-G formula based on SCr of 8.15 mg/dL (H)).  Assessment: 55 yom presenting with STEMI. Known to have dilated ascending aorta, ESRD. Found to have 99% mid LAD with multivessel in LCx and RCA, now s/p DES to LAD. Heparin was started post sheath removal. Patient was found to have bacteremia and will not proceed with cath intervention at this time. Patient developed Afib during admission treated  with amiodarone and successful cardioversion. This morning, patient's Hgb fell to 6.1 requiring 1unit PRBC and patient was scheduled for placement of HD cath in preparation to start CRRT. Heparin was held this morning around 1000 for these reasons. The abdominal CT revealed no bleeding and heparin was restarted at 1600units/hr.  CRRT will begin today.   Heparin level is therapeutic tonight at 0.47 units/mL.  RN reports minor bleeding from line.  Noted patient is started on  cangrelor while Brilinta is on hold due to enteral access issue.  This combination may increase patient's risk for bleeding.  Goal of Therapy:  Heparin level 0.3-0.5 units/mL while on cangrelor Monitor platelets by anticoagulation protocol: Yes   Plan:  Reduce IV heparin slightly to 1550 units/hr F/U AM labs and replacement of enteral access  Monitor closely for bleeding   Shawni Volkov D. Mina Marble, PharmD, BCPS, [place] 11/01/2020, 9:32 PM

## 2020-11-01 NOTE — Progress Notes (Signed)
At 0330 patient became very agitated and would not calm down after .5x2 Dilaudid boluses. Was instructed by CCM team to given additional Dilaudid boluses even after requesting versaid instead. A 3 mg bolus of dilaudid was given and soon after patient bradied down and went asystole. CPR was started immediately and 1 of epi was given. 0.4 of narcan was given and patient regained ROSC and had to be restrained. Started on prop and prn versaid pushes. Labs were sent and family was notified.

## 2020-11-01 NOTE — Progress Notes (Signed)
[facility]  Date of Admission:  10/28/2020      Lines:  11/21-c left groin cvc Peripheral iv's  Abx: 11/22-c cefazolin 1 gram qhs 11/21 vanc/piptazo                                                                Assessment: 59 yo male no ivdu history, pmh htn, esrd on iHD, cad,  Admitted with sepsis and anterior wall stemi, s/p emergent PCI, found to have septic shock post procedure and mssa bacteremia  Patient reports fever/chill 3 days prior to admission, then on the day of admission chest pain. This suggest sepsis/BSI likely from dialysis source with resultant stemi  Would call this complicated mssa BSI and will need TEE to r/o endocarditis  11/21 bcx positive; 11/22 & 11/23 ngtd 11/22 tte lv dysfunction/wma but no valvular vegetation  No other hardware. The fistula is native. There is reported infection of coronary stent but rare  11/24 lv dysfunction/pea arrest and hypotension Infection should be undercontrol with abx at this time. Worry primary cardiac dysfunction contributing to current clinical picture, post STEMI  Plan: 1. Ongoing supportive care per primary team; tee when feasible 2. F/u repeat bcx  3. Continue renal dosed cefazolin; will review with pharmacy on dosing adjustment as patient will be on crrt  Principal Problem:   Acute ST elevation myocardial infarction (STEMI) of anterolateral wall (HCC) Active Problems:   ESRD (end stage renal disease) ([place])   Essential hypertension   Hyperlipidemia with target LDL less than 70   3 V- CAD w/ ACS/STEMI: Culprit = 99% mLAD @ D1 (DES PCI jailing D1); 99% ost-AVGLCx, 80% calcified napkin ring prox RCA.    Presence of drug coated stent in LAD coronary artery: Resolute Onyx DES 2.75 mm x 18 mm (3.1 mm) at major D1 & SP1.   Acute combined systolic and diastolic heart failure (HCC)   Atrial fibrillation with RVR (HCC)   MSSA bacteremia   Septic shock due to  Staphylococcus aureus (HCC)   Dyspnea   Scheduled Meds:  sodium chloride   Intravenous Once   amiodarone  400 mg Oral BID   aspirin  81 mg Oral Daily   atorvastatin  80 mg Oral q1800   chlorhexidine gluconate (MEDLINE KIT)  15 mL Mouth Rinse BID   Chlorhexidine Gluconate Cloth  6 each Topical Q0600   docusate  100 mg Per Tube BID   doxercalciferol  5 mcg Intravenous Q M,W,F-HD   famotidine  10 mg Oral Daily   ferric citrate  210 mg Oral With snacks   ferric citrate  630 mg Oral TID with meals   lidocaine  1 patch Transdermal Q24H   mouth rinse  15 mL Mouth Rinse 10 times per day   mupirocin ointment  1 application Nasal BID   polyethylene glycol  17 g Per Tube Daily   sodium chloride flush  10-40 mL Intracatheter Q12H   sodium chloride flush  3 mL Intravenous Q12H   Thrombi-Pad  1 each Topical Once   ticagrelor  90 mg Oral BID   Continuous Infusions:   prismasol BGK 4/2.5      prismasol BGK 4/2.5     sodium chloride  10 mL/hr at 10/29/20 2300   sodium chloride Stopped (10/28/20 0954)   sodium chloride Stopped (10/30/20 1744)    ceFAZolin (ANCEF) IV Stopped (10/31/20 2241)   dexmedetomidine (PRECEDEX) IV infusion Stopped (11/01/20 0317)   HYDROmorphone Stopped (11/01/20 0318)   nitroGLYCERIN Stopped (10/28/20 2100)   norepinephrine (LEVOPHED) Adult infusion 20 mcg/min (11/01/20 0700)   phenylephrine (NEO-SYNEPHRINE) Adult infusion Stopped (10/29/20 2237)   prismasol BGK 4/2.5     propofol (DIPRIVAN) infusion 30 mcg/kg/min (11/01/20 0812)   vasopressin 0.03 Units/min (11/01/20 0833)   PRN Meds:.sodium chloride, acetaminophen, heparin, HYDROmorphone, HYDROmorphone (DILAUDID) injection, HYDROmorphone (DILAUDID) injection, midazolam, nitroGLYCERIN, ondansetron (ZOFRAN) IV, sodium chloride, sodium chloride flush, sodium chloride flush   SUBJECTIVE: PEA arrest last night, successful ROSC; reintubated TTE showed WMA and lv dysfunction but no  vegetation on valve On high dose pressor; will be transitioning to CRRT bcx repeat ngtd Afebrile   Review of Systems: ROS Negative 11 point ros unless mentioned above  Allergies  Allergen Reactions   Ibuprofen Hives   Lisinopril Swelling    PT states he is allergic to all prils; caused facial swelling   Naproxen Hives and Other (See Comments)    Alleve causes patient to have hives    OBJECTIVE: Vitals:   11/01/20 0828 11/01/20 0830 11/01/20 0843 11/01/20 0845  BP: (!) 92/46 92/62 95/62  92/64  Pulse: 74  74   Resp: 16 17 16 16   Temp: 98.6 F (37 C)  98.6 F (37 C)   TempSrc:   Oral   SpO2:      Weight:      Height:       Body mass index is 25.46 kg/m.  Physical Exam intubated Heent: Normocephalic; conj clear; per; eomi Cv: tachy regular, no mrg Lungs: clear; normal respiratory effort Abd: s/nt Ext: no edema Neuro nonfocal Psych: sedated  Lab Results Lab Results  Component Value Date   WBC 14.7 (H) 11/01/2020   HGB 6.1 (LL) 11/01/2020   HCT 19.3 (L) 11/01/2020   MCV 96.5 11/01/2020   PLT 234 11/01/2020    Lab Results  Component Value Date   CREATININE 10.59 (H) 11/01/2020   BUN 82 (H) 11/01/2020   NA 137 11/01/2020   K 5.0 11/01/2020   CL 95 (L) 11/01/2020   CO2 23 11/01/2020    Lab Results  Component Value Date   ALT 51 (H) 10/30/2020   AST 87 (H) 10/30/2020   ALKPHOS 95 10/30/2020   BILITOT 1.5 (H) 10/30/2020     Microbiology: Recent Results (from the past 240 hour(s))  Respiratory Panel by RT PCR (Flu A&B, Covid) - Nasopharyngeal Swab     Status: None   Collection Time: 10/28/20  6:16 AM   Specimen: Nasopharyngeal Swab; Nasopharyngeal(NP) swabs in vial transport medium  Result Value Ref Range Status   SARS Coronavirus 2 by RT PCR NEGATIVE NEGATIVE Final    Comment: (NOTE) SARS-CoV-2 target nucleic acids are NOT DETECTED.  The SARS-CoV-2 RNA is generally detectable in upper respiratoy specimens during the acute phase of infection.  The lowest concentration of SARS-CoV-2 viral copies this assay can detect is 131 copies/mL. A negative result does not preclude SARS-Cov-2 infection and should not be used as the sole basis for treatment or other patient management decisions. A negative result may occur with  improper specimen collection/handling, submission of specimen other than nasopharyngeal swab, presence of viral mutation(s) within the areas targeted by this assay, and inadequate number of viral copies (<131 copies/mL). A negative  result must be combined with clinical observations, patient history, and epidemiological information. The expected result is Negative.  Fact Sheet for Patients:  PinkCheek.be  Fact Sheet for Healthcare Providers:  GravelBags.it  This test is no t yet approved or cleared by the [place] FDA and  has been authorized for detection and/or diagnosis of SARS-CoV-2 by FDA under an Emergency Use Authorization (EUA). This EUA will remain  in effect (meaning this test can be used) for the duration of the COVID-19 declaration under Section 564(b)(1) of the Act, 21 U.S.C. section 360bbb-3(b)(1), unless the authorization is terminated or revoked sooner.     Influenza A by PCR NEGATIVE NEGATIVE Final   Influenza B by PCR NEGATIVE NEGATIVE Final    Comment: (NOTE) The Xpert Xpress SARS-CoV-2/FLU/RSV assay is intended as an aid in  the diagnosis of influenza from Nasopharyngeal swab specimens and  should not be used as a sole basis for treatment. Nasal washings and  aspirates are unacceptable for Xpert Xpress SARS-CoV-2/FLU/RSV  testing.  Fact Sheet for Patients: PinkCheek.be  Fact Sheet for Healthcare Providers: GravelBags.it  This test is not yet approved or cleared by the [place] FDA and  has been authorized for detection and/or diagnosis of SARS-CoV-2 by  FDA  under an Emergency Use Authorization (EUA). This EUA will remain  in effect (meaning this test can be used) for the duration of the  Covid-19 declaration under Section 564(b)(1) of the Act, 21  U.S.C. section 360bbb-3(b)(1), unless the authorization is  terminated or revoked. Performed at [facility], [place] [address], [place], [place] [address]   Surgical PCR screen     Status: Abnormal   Collection Time: 10/28/20  6:10 PM   Specimen: Nasal Mucosa; Nasal Swab  Result Value Ref Range Status   MRSA, PCR NEGATIVE NEGATIVE Final   Staphylococcus aureus POSITIVE (A) NEGATIVE Final    Comment: (NOTE) The Xpert SA Assay (FDA approved for NASAL specimens in patients 31 years of age and older), is one component of a comprehensive surveillance program. It is not intended to diagnose infection nor to guide or monitor treatment. Performed at [facility], [place] [address], [place], [place] [address]   MRSA PCR Screening     Status: None   Collection Time: 10/28/20 10:10 PM   Specimen: Nasal Mucosa; Nasopharyngeal  Result Value Ref Range Status   MRSA by PCR NEGATIVE NEGATIVE Final    Comment:        The GeneXpert MRSA Assay (FDA approved for NASAL specimens only), is one component of a comprehensive MRSA colonization surveillance program. It is not intended to diagnose MRSA infection nor to guide or monitor treatment for MRSA infections. Performed at [facility], [place] [address], [place], [place] [address]   Culture, blood (Routine X 2) w Reflex to ID Panel     Status: Abnormal   Collection Time: 10/29/20  8:30 AM   Specimen: BLOOD LEFT HAND  Result Value Ref Range Status   Specimen Description BLOOD LEFT HAND  Final   Special Requests   Final    BOTTLES DRAWN AEROBIC AND ANAEROBIC Blood Culture adequate volume   Culture  Setup Time   Final    GRAM POSITIVE COCCI IN CLUSTERS IN BOTH AEROBIC AND ANAEROBIC BOTTLES CRITICAL RESULT CALLED TO, READ BACK BY AND VERIFIED  WITH: M. BITONTI,PHARMD 2138 10/29/2020 Mena Goes Performed at [facility], [place] [address], [place],  [address]    Culture  STAPHYLOCOCCUS AUREUS (A)  Final   Report Status 10/31/2020 FINAL  Final   Organism ID, Bacteria STAPHYLOCOCCUS AUREUS  Final      Susceptibility   Staphylococcus aureus - MIC*    CIPROFLOXACIN <=0.5 SENSITIVE Sensitive     ERYTHROMYCIN <=0.25 SENSITIVE Sensitive     GENTAMICIN <=0.5 SENSITIVE Sensitive     OXACILLIN 0.5 SENSITIVE Sensitive     TETRACYCLINE <=1 SENSITIVE Sensitive     VANCOMYCIN 1 SENSITIVE Sensitive     TRIMETH/SULFA <=10 SENSITIVE Sensitive     CLINDAMYCIN <=0.25 SENSITIVE Sensitive     RIFAMPIN <=0.5 SENSITIVE Sensitive     Inducible Clindamycin NEGATIVE Sensitive     * STAPHYLOCOCCUS AUREUS  Blood Culture ID Panel (Reflexed)     Status: Abnormal   Collection Time: 10/29/20  8:30 AM  Result Value Ref Range Status   Enterococcus faecalis NOT DETECTED NOT DETECTED Final   Enterococcus Faecium NOT DETECTED NOT DETECTED Final   Listeria monocytogenes NOT DETECTED NOT DETECTED Final   Staphylococcus species DETECTED (A) NOT DETECTED Final    Comment: CRITICAL RESULT CALLED TO, READ BACK BY AND VERIFIED WITH: M. BITONTI,PHARMD 2138 10/29/2020 T. TYSOR    Staphylococcus aureus (BCID) DETECTED (A) NOT DETECTED Final    Comment: CRITICAL RESULT CALLED TO, READ BACK BY AND VERIFIED WITH: M. BITONTI,PHARMD 2138 10/29/2020 T. TYSOR    Staphylococcus epidermidis NOT DETECTED NOT DETECTED Final   Staphylococcus lugdunensis NOT DETECTED NOT DETECTED Final   Streptococcus species NOT DETECTED NOT DETECTED Final   Streptococcus agalactiae NOT DETECTED NOT DETECTED Final   Streptococcus pneumoniae NOT DETECTED NOT DETECTED Final   Streptococcus pyogenes NOT DETECTED NOT DETECTED Final   A.calcoaceticus-baumannii NOT DETECTED NOT DETECTED Final   Bacteroides fragilis NOT DETECTED NOT DETECTED Final   Enterobacterales NOT DETECTED NOT  DETECTED Final   Enterobacter cloacae complex NOT DETECTED NOT DETECTED Final   Escherichia coli NOT DETECTED NOT DETECTED Final   Klebsiella aerogenes NOT DETECTED NOT DETECTED Final   Klebsiella oxytoca NOT DETECTED NOT DETECTED Final   Klebsiella pneumoniae NOT DETECTED NOT DETECTED Final   Proteus species NOT DETECTED NOT DETECTED Final   Salmonella species NOT DETECTED NOT DETECTED Final   Serratia marcescens NOT DETECTED NOT DETECTED Final   Haemophilus influenzae NOT DETECTED NOT DETECTED Final   Neisseria meningitidis NOT DETECTED NOT DETECTED Final   Pseudomonas aeruginosa NOT DETECTED NOT DETECTED Final   Stenotrophomonas maltophilia NOT DETECTED NOT DETECTED Final   Candida albicans NOT DETECTED NOT DETECTED Final   Candida auris NOT DETECTED NOT DETECTED Final   Candida glabrata NOT DETECTED NOT DETECTED Final   Candida krusei NOT DETECTED NOT DETECTED Final   Candida parapsilosis NOT DETECTED NOT DETECTED Final   Candida tropicalis NOT DETECTED NOT DETECTED Final   Cryptococcus neoformans/gattii NOT DETECTED NOT DETECTED Final   Meth resistant mecA/C and MREJ NOT DETECTED NOT DETECTED Final    Comment: Performed at [facility], [address], [place], [place] [address]  Culture, blood (Routine X 2) w Reflex to ID Panel     Status: Abnormal   Collection Time: 10/29/20  8:45 AM   Specimen: BLOOD  Result Value Ref Range Status   Specimen Description BLOOD LEFT ANTECUBITAL  Final   Special Requests   Final    BOTTLES DRAWN AEROBIC AND ANAEROBIC Blood Culture adequate volume   Culture  Setup Time   Final    GRAM POSITIVE COCCI IN CLUSTERS IN BOTH AEROBIC AND ANAEROBIC  BOTTLES CRITICAL RESULT CALLED TO, READ BACK BY AND VERIFIED WITH: M. BITONTI,PHARMD 2138 10/29/2020 T. TYSOR    Culture (A)  Final    STAPHYLOCOCCUS AUREUS SUSCEPTIBILITIES PERFORMED ON PREVIOUS CULTURE WITHIN THE LAST 5 DAYS. Performed at [facility], [place] [address] [place], [place]  [address]    Report Status 10/31/2020 FINAL  Final  Culture, blood (routine x 2)     Status: None (Preliminary result)   Collection Time: 10/30/20  6:51 PM   Specimen: BLOOD  Result Value Ref Range Status   Specimen Description BLOOD BLOOD RIGHT FOREARM  Final   Special Requests   Final    BOTTLES DRAWN AEROBIC ONLY Blood Culture results may not be optimal due to an inadequate volume of blood received in culture bottles   Culture   Final    NO GROWTH 2 DAYS Performed at [facility], [place] [address] [place], [place] [address]    Report Status PENDING  Incomplete  Culture, blood (routine x 2)     Status: None (Preliminary result)   Collection Time: 10/30/20  6:57 PM   Specimen: BLOOD RIGHT HAND  Result Value Ref Range Status   Specimen Description BLOOD RIGHT HAND  Final   Special Requests   Final    BOTTLES DRAWN AEROBIC ONLY Blood Culture results may not be optimal due to an inadequate volume of blood received in culture bottles   Culture   Final    NO GROWTH 2 DAYS Performed at [facility], [place] [address], [place], [place] [address]    Report Status PENDING  Incomplete  Culture, blood (routine x 2)     Status: None (Preliminary result)   Collection Time: 10/31/20  3:17 PM   Specimen: BLOOD  Result Value Ref Range Status   Specimen Description BLOOD RIGHT FOOT  Final   Special Requests   Final    BOTTLES DRAWN AEROBIC ONLY Blood Culture adequate volume   Culture   Final    NO GROWTH < 24 HOURS Performed at [facility], [place] [address], [place], [place] [address]    Report Status PENDING  Incomplete  Culture, blood (routine x 2)     Status: None (Preliminary result)   Collection Time: 10/31/20  3:25 PM   Specimen: BLOOD  Result Value Ref Range Status   Specimen Description BLOOD RIGHT FOOT  Final   Special Requests   Final    BOTTLES DRAWN AEROBIC AND ANAEROBIC Blood Culture adequate volume   Culture   Final    NO GROWTH < 24 HOURS Performed at [facility], [place] [address] [place], [place] [address]    Report Status PENDING  Incomplete  Culture, respiratory     Status: None (Preliminary result)   Collection Time: 10/31/20  4:56 PM   Specimen: Tracheal Aspirate  Result Value Ref Range Status   Specimen Description TRACHEAL ASPIRATE  Final   Special Requests NONE  Final   Gram Stain   Final    MODERATE WBC PRESENT, PREDOMINANTLY PMN FEW GRAM NEGATIVE RODS RARE GRAM POSITIVE COCCI RARE GRAM POSITIVE RODS    Culture   Final    CULTURE REINCUBATED FOR BETTER GROWTH Performed at [facility], [place] [address] [place], [place] [address]    Report Status PENDING  Incomplete    Serology: n/a   Jabier Mutton, [place] [facility] [place] [phone] pager  11/01/2020, 9:45 AM

## 2020-11-02 DIAGNOSIS — I4891 Unspecified atrial fibrillation: Secondary | ICD-10-CM | POA: Diagnosis not present

## 2020-11-02 DIAGNOSIS — J15211 Pneumonia due to Methicillin susceptible Staphylococcus aureus: Secondary | ICD-10-CM | POA: Clinically undetermined

## 2020-11-02 DIAGNOSIS — I2109 ST elevation (STEMI) myocardial infarction involving other coronary artery of anterior wall: Secondary | ICD-10-CM | POA: Diagnosis not present

## 2020-11-02 DIAGNOSIS — I5041 Acute combined systolic (congestive) and diastolic (congestive) heart failure: Secondary | ICD-10-CM | POA: Diagnosis not present

## 2020-11-02 DIAGNOSIS — I469 Cardiac arrest, cause unspecified: Secondary | ICD-10-CM | POA: Diagnosis not present

## 2020-11-02 DIAGNOSIS — T8112XA Postprocedural septic shock, initial encounter: Secondary | ICD-10-CM

## 2020-11-02 DIAGNOSIS — I340 Nonrheumatic mitral (valve) insufficiency: Secondary | ICD-10-CM | POA: Clinically undetermined

## 2020-11-02 DIAGNOSIS — T85598S Other mechanical complication of other gastrointestinal prosthetic devices, implants and grafts, sequela: Secondary | ICD-10-CM

## 2020-11-02 LAB — RENAL FUNCTION PANEL
Albumin: 1.6 g/dL — ABNORMAL LOW (ref 3.5–5.0)
Albumin: 1.7 g/dL — ABNORMAL LOW (ref 3.5–5.0)
Anion gap: 15 (ref 5–15)
Anion gap: 15 (ref 5–15)
BUN: 60 mg/dL — ABNORMAL HIGH (ref 6–20)
BUN: 39 mg/dL — ABNORMAL HIGH (ref 6–20)
CO2: 23 mmol/L (ref 22–32)
CO2: 23 mmol/L (ref 22–32)
Calcium: 9.1 mg/dL (ref 8.9–10.3)
Calcium: 8.9 mg/dL (ref 8.9–10.3)
Chloride: 98 mmol/L (ref 98–111)
Chloride: 97 mmol/L — ABNORMAL LOW (ref 98–111)
Creatinine, Ser: 6.78 mg/dL — ABNORMAL HIGH (ref 0.61–1.24)
Creatinine, Ser: 5.16 mg/dL — ABNORMAL HIGH (ref 0.61–1.24)
GFR, Estimated: 9 mL/min — ABNORMAL LOW (ref >60)
GFR, Estimated: 12 mL/min — ABNORMAL LOW (ref >60)
Glucose, Bld: 99 mg/dL (ref 70–99)
Glucose, Bld: 101 mg/dL — ABNORMAL HIGH (ref 70–99)
Phosphorus: 5.8 mg/dL — ABNORMAL HIGH (ref 2.5–4.6)
Phosphorus: 5.8 mg/dL — ABNORMAL HIGH (ref 2.5–4.6)
Potassium: 4.9 mmol/L (ref 3.5–5.1)
Potassium: 4.8 mmol/L (ref 3.5–5.1)
Sodium: 136 mmol/L (ref 135–145)
Sodium: 135 mmol/L (ref 135–145)

## 2020-11-02 LAB — CBC
HCT: 22 % — ABNORMAL LOW (ref 39.0–52.0)
Hemoglobin: 7.5 g/dL — ABNORMAL LOW (ref 13.0–17.0)
MCH: 32.6 pg (ref 26.0–34.0)
MCHC: 34.1 g/dL (ref 30.0–36.0)
MCV: 95.7 fL (ref 80.0–100.0)
Platelets: 295 10*3/uL (ref 150–400)
RBC: 2.3 MIL/uL — ABNORMAL LOW (ref 4.22–5.81)
RDW: 15.9 % — ABNORMAL HIGH (ref 11.5–15.5)
WBC: 27.9 10*3/uL — ABNORMAL HIGH (ref 4.0–10.5)
nRBC: 1 % — ABNORMAL HIGH (ref 0.0–0.2)

## 2020-11-02 LAB — TYPE AND SCREEN
ABO/RH(D): O POS
Antibody Screen: NEGATIVE
Unit division: 0

## 2020-11-02 LAB — MAGNESIUM: Magnesium: 2.8 mg/dL — ABNORMAL HIGH (ref 1.7–2.4)

## 2020-11-02 LAB — BPAM RBC
Blood Product Expiration Date: 202112242359
ISSUE DATE / TIME: 202111240756
Unit Type and Rh: 5100

## 2020-11-02 LAB — HEPARIN LEVEL (UNFRACTIONATED): Heparin Unfractionated: 0.34 IU/mL (ref 0.30–0.70)

## 2020-11-02 LAB — GLUCOSE, CAPILLARY: Glucose-Capillary: 106 mg/dL — ABNORMAL HIGH (ref 70–99)

## 2020-11-02 MED ORDER — CEFAZOLIN SODIUM-DEXTROSE 2-4 GM/100ML-% IV SOLN
2.0000 g | Freq: Two times a day (BID) | INTRAVENOUS | Status: DC
  Administered 2020-11-02 – 2020-11-07 (×10): 2 g via INTRAVENOUS
  Filled 2020-11-02 (×12): qty 100

## 2020-11-02 MED ORDER — HYDROCORTISONE NA SUCCINATE PF 100 MG IJ SOLR
50.0000 mg | Freq: Three times a day (TID) | INTRAMUSCULAR | Status: DC
  Administered 2020-11-02 – 2020-11-04 (×7): 50 mg via INTRAVENOUS
  Filled 2020-11-02 (×8): qty 2

## 2020-11-02 MED ORDER — LORAZEPAM 2 MG/ML IJ SOLN
2.0000 mg | INTRAMUSCULAR | Status: DC | PRN
  Administered 2020-11-02 – 2020-11-12 (×26): 2 mg via INTRAVENOUS
  Filled 2020-11-02 (×30): qty 1

## 2020-11-02 NOTE — Plan of Care (Signed)
  Problem: Cardiovascular: Goal: Ability to achieve and maintain adequate cardiovascular perfusion will improve Outcome: Progressing   Problem: Clinical Measurements: Goal: Ability to maintain clinical measurements within normal limits will improve Outcome: Progressing   Problem: Respiratory: Goal: Ability to maintain a clear airway and adequate ventilation will improve Outcome: Progressing   Problem: Nutrition: Goal: Adequate nutrition will be maintained Outcome: Not Progressing Note: Unable to get a feeding tube placed.

## 2020-11-02 NOTE — Progress Notes (Signed)
[facility] Progress Note   Assessment/ Plan:   OP HD:AF MWF 4h 450/800 81.5kg 2/2.25 bath RUE AVF Hep 5000+ 1026mdrun - hect 5 ug tiw  - mircera 50 ug q 4, last 11/15 (due 11/29)    Assessment/ Plan: 1. Acute STEMI - SP LAD PCI 11/20 am, has other disease in LCx/ RCA. Per cardiology. 2. SOB/Acute hypoxic RF - had pulm edema by CXR w/ SOB / coughing. Initially better but had more tachypnea and coughing, had CT chest  11/23 with multifocal PNA and likely cavitary lesion.  Intubated 11/23.  Per PCCM.  Respiratory culture 11/23 3. PEA arrest: occurred in setting of agitation/ sedation.  ROSC with 1 mg epi/ CPR/ 1 min. 4. MSSA bacteremia- cultures initially positive 11/21.  on cefazolin,  Cultures 11/22, 11/23, and 11/24 5. ESRD - usual HD MWF. Had LHC w/ high LVEDP +35 mmHg. Lowering vol w/ HD as tolerated. Started CRRT 11/24, 4K pre and post, 2K dialysate, no heparin d/t hep gtt  6. Shock: presumably septic+/- cardiogenic, pressors per PCCM 7. 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 Afib: on amio and hep gtt, s/p DCCV 10. Severe MR: per cardiology  Subjective:    Continues on CRRT.  TEE with severe MR.     Objective:   BP (!) 93/49   Pulse 74   Temp (!) 97.5 F (36.4 C) (Axillary)   Resp 15   Ht 5' 11"  (1.803 m)   Wt 81.4 kg   SpO2 100%   BMI 25.03 kg/m   Physical Exam: GNIO:EVOJJin bed, intubated, sedated [id number] no m/r/g Resp: tachypneic Abd: soft Ext: no gross edema ACCESS: LUE AVF + T/B  Labs: BMET Recent Labs  Lab 10/29/20 2121 10/29/20 2128 10/30/20 0548 10/30/20 0548 10/31/20 0830 10/31/20 1811 11/01/20 0336 11/01/20 0430 11/01/20 1514 11/01/20 2048 11/02/20 0301  NA 132*   < > 125*   < > 136 134* 134* 137 138 133* 136  K 3.1*   < > 5.2*   < > 5.1 5.0 5.7* 5.0 5.4* 5.1 4.9  CL 99  --  86*  --  93*  --   --  95* 92* 95* 98  CO2 20*  --  22  --  26  --    --  23 24 20* 23  GLUCOSE 208*  --  148*  --  118*  --   --  169* 88 103* 99  BUN 48*  --  75*  --  52*  --   --  82* 88* 75* 60*  CREATININE 7.33*  --  11.18*  --  8.19*  --   --  10.59* 10.91* 8.15* 6.78*  CALCIUM 6.5*  --  9.1  --  8.9  --   --  8.7* 9.0 8.9 9.1  PHOS  --   --   --   --  4.9*  --   --  8.3* 7.1*  --  5.8*   < > = values in this interval not displayed.   CBC Recent Labs  Lab 10/28/20 0604 10/29/20 0101 10/29/20 2121 10/29/20 2128 10/31/20 0030 10/31/20 1811 11/01/20 0430 11/01/20 1454 11/01/20 2045 11/02/20 0301  WBC 12.7*   < > 12.9*   < > 16.3*  --  14.7*  --  28.1* 27.9*  NEUTROABS 11.4*  --  11.8*  --   --   --   --   --   --   --  HGB 9.5*   < > 6.0*   < > 8.0*   < > 6.1* 7.3* 7.6* 7.5*  HCT 29.5*   < > 18.8*   < > 25.1*   < > 19.3* 21.7* 21.6* 22.0*  MCV 95.5   < > 93.5   < > 93.3  --  96.5  --  92.7 95.7  PLT 143*   < > 86*   < > 128*  --  234  --  291 295   < > = values in this interval not displayed.      Medications:    . sodium chloride   Intravenous Once  . aspirin  150 mg Rectal Daily  . atorvastatin  80 mg Oral q1800  . chlorhexidine gluconate (MEDLINE KIT)  15 mL Mouth Rinse BID  . Chlorhexidine Gluconate Cloth  6 each Topical Q0600  . docusate  100 mg Per Tube BID  . doxercalciferol  5 mcg Intravenous Q M,W,F-HD  . ferric citrate  210 mg Oral With snacks  . ferric citrate  630 mg Oral TID with meals  . hydrocortisone sod succinate (SOLU-CORTEF) inj  50 mg Intravenous Q8H  . lidocaine  1 patch Transdermal Q24H  . mouth rinse  15 mL Mouth Rinse 10 times per day  . polyethylene glycol  17 g Per Tube Daily  . sodium chloride flush  10-40 mL Intracatheter Q12H  . sodium chloride flush  3 mL Intravenous Q12H  . Thrombi-Pad  1 each Topical Once     Madelon Lips MD 11/02/2020, 10:12 AM

## 2020-11-02 NOTE — Progress Notes (Signed)
ANTICOAGULATION CONSULT NOTE - Follow Up Consult  Pharmacy Consult for heparin Indication: STEMI and Afib   Labs: Recent Labs    10/30/20 0548 10/30/20 1250 10/31/20 0030 10/31/20 0448 10/31/20 1526 10/31/20 1811 11/01/20 0430 11/01/20 0430 11/01/20 1454 11/01/20 1514 11/01/20 2045 11/01/20 2048 11/01/20 2059  HGB  --   --  8.0*  --   --    < > 6.1*   < > 7.3*  --  7.6*  --   --   HCT  --   --  25.1*  --   --    < > 19.3*  --  21.7*  --  21.6*  --   --   PLT  --   --  128*  --   --   --  234  --   --   --  291  --   --   LABPROT 15.0  --   --   --   --   --   --   --   --   --   --   --   --   INR 1.2  --   --   --   --   --   --   --   --   --   --   --   --   HEPARINUNFRC  --    < >  --    < > 0.44  --  0.49  --   --   --   --   --  0.47  CREATININE 11.18*  --   --    < >  --   --  10.59*  --   --  10.91*  --  8.15*  --   TROPONINIHS >27,000*  --   --   --   --   --   --   --   --   --   --   --   --    < > = values in this interval not displayed.    Assessment: RN reports continued bleeding at IJ site; heparin level was previously at goal though at upper end so there is some room to decrease rate and hopefully still stay at goal.  Goal of Therapy:  Heparin level 0.3-0.5 units/ml   Plan:  Will decrease heparin gtt by 2 units/kg/hr to 1400 units/hr and check level in 8 hours.    Wynona Neat, PharmD, BCPS  11/02/2020,12:22 AM

## 2020-11-02 NOTE — Progress Notes (Signed)
[facility]  Date of Admission:  10/28/2020     Total days of antibiotics: 5         Current antibiotics: 11/22-c cefazolin 1 gram qhs 11/21 vanc/piptazo  Reason for visit: Follow up on MSSA bacteremia   ASSESSMENT AND PLAN:   Travis [name] is a 59 y.o. male with past medical history of hypertension, end-stage renal disease on intermittent hemodialysis, coronary artery disease, admitted with sepsis and anterior wall STEMI status post emergent PCI.  Found to have septic shock post procedure and MSSA bacteremia.  Repeat blood cultures are negative and transesophageal echo without definite evidence of vegetations (echodensities noted that could be consistent with calcification in a patient with end-stage renal disease).  CT chest from 11/23 with findings of pneumonia.   #MSSA bacteremia #Pneumonia #ESRD #STEMI status post PCI --Continue renally dosed cefazolin --Follow-up repeat blood cultures --Ongoing supportive care per critical care and cardiology --Will need lines exchanged/removed that were placed while bacteremic  Principal Problem:   Acute ST elevation myocardial infarction (STEMI) of anterolateral wall (HCC) Active Problems:   ESRD (end stage renal disease) ([place])   Essential hypertension   Hyperlipidemia with target LDL less than 70   3 V- CAD w/ ACS/STEMI: Culprit = 99% mLAD @ D1 (DES PCI jailing D1); 99% ost-AVGLCx, 80% calcified napkin ring prox RCA.    Presence of drug coated stent in LAD coronary artery: Resolute Onyx DES 2.75 mm x 18 mm (3.1 mm) at major D1 & SP1.   Acute combined systolic and diastolic heart failure (HCC)   Atrial fibrillation with RVR ([place])   MSSA bacteremia   Septic shock due to Staphylococcus aureus ([place])   Dyspnea   Cardiac arrest (HCC)   SUBJECTIVE:   Transesophageal echo yesterday without clear vegetations Repeat blood cultures from 11/22, 11/23, and 11/24 are no growth to date Remains on the  ventilator Unable to obtain subjective due to intubated status   OBJECTIVE:   Allergies  Allergen Reactions  . Ibuprofen Hives  . Lisinopril Swelling    PT states he is allergic to all prils; caused facial swelling  . Naproxen Hives and Other (See Comments)    Alleve causes patient to have hives    Blood pressure (!) 151/46, pulse 71, temperature 98.4 F (36.9 C), temperature source Oral, resp. rate 15, height 5\' 11"  (1.803 m), weight 81.4 kg, SpO2 100 %. Body mass index is 25.03 kg/m.  Physical Exam  General: Vital signs reviewed.  Patient is currently intubated and sedated on the ventilator. Head: Normocephalic and atraumatic.  ET tube in place Neck: Supple Cardiovascular: RRR Pulmonary/Chest: Breathing assisted on vent; coarse breath sounds anteriorly. Abdominal: Soft, non-tender, non-distended Musculoskeletal: No joint deformities, erythema, or stiffness Extremities: No swelling or edema.  Neurological: Sedated Skin: Warm, dry and intact. No rashes or erythema.        Lab Results & Microbiology Lab Results  Component Value Date   WBC 27.9 (H) 11/02/2020   HGB 7.5 (L) 11/02/2020   HCT 22.0 (L) 11/02/2020   MCV 95.7 11/02/2020   PLT 295 11/02/2020    Lab Results  Component Value Date   NA 136 11/02/2020   K 4.9 11/02/2020   CO2 23 11/02/2020   GLUCOSE 99 11/02/2020   BUN 60 (H) 11/02/2020   CREATININE 6.78 (H) 11/02/2020   CALCIUM 9.1 11/02/2020   GFRNONAA 9 (L) 11/02/2020   GFRAA 6 (L) 02/26/2018    Lab  Results  Component Value Date   ALT 51 (H) 10/30/2020   AST 87 (H) 10/30/2020   ALKPHOS 95 10/30/2020   BILITOT 1.5 (H) 10/30/2020     I have reviewed the micro and lab results in Epic.  Imaging CT ABDOMEN PELVIS WO CONTRAST  Result Date: 11/01/2020 CLINICAL DATA:  Anemia, dropping hemoglobin, concern for retroperitoneal hematoma following cardiac catheterization EXAM: CT ABDOMEN AND PELVIS WITHOUT CONTRAST TECHNIQUE: Multidetector CT imaging  of the abdomen and pelvis was performed following the standard protocol without IV contrast. COMPARISON:  None. FINDINGS: Lower chest: There are small bilateral pleural effusions and associated atelectasis or consolidation, with a significant amount of somewhat nodular heterogeneous airspace disease present in the right greater than left lower lobes. Cardiomegaly. Three-vessel coronary artery calcifications. Hepatobiliary: No solid liver abnormality is seen. Excreted contrast in the gallbladder. No gallstones, gallbladder wall thickening, or biliary dilatation. Pancreas: Unremarkable. No pancreatic ductal dilatation or surrounding inflammatory changes. Spleen: Normal in size without significant abnormality. Adrenals/Urinary Tract: Adrenal glands are unremarkable. Status post right nephrectomy. The left kidney is atrophic. Bladder is unremarkable. Stomach/Bowel: Stomach is within normal limits. Appendix appears normal. No evidence of bowel wall thickening, distention, or inflammatory changes. Colonic diverticulosis. Vascular/Lymphatic: Aortic atherosclerosis. Left femoral venous catheter. No enlarged abdominal or pelvic lymph nodes. Reproductive: No mass or other significant abnormality. Other: Mild anasarca.  No abdominopelvic ascites. Musculoskeletal: No acute or significant osseous findings. Renal osteodystrophy. IMPRESSION: 1. No evidence of retroperitoneal hematoma or other nidus of hemorrhage. 2. There are small bilateral pleural effusions and associated atelectasis or consolidation, with a significant amount of somewhat nodular heterogeneous airspace disease present in the right greater than left lower lobes. Findings are concerning for infection or aspiration 3. Cardiomegaly and coronary artery disease. 4. Status post right nephrectomy. Aortic Atherosclerosis (ICD10-I70.0). Electronically Signed   By: Eddie Candle M.D.   On: 11/01/2020 10:58   DG Abd 1 View  Result Date: 11/01/2020 CLINICAL DATA:   Orogastric tube placement EXAM: ABDOMEN - 1 VIEW COMPARISON:  Multiple prior abdominal studies and chest x-ray of the same date. FINDINGS: Gastric tube tip in the distal esophagus. Similar to the prior exam. Consolidative changes at the RIGHT lung base likely associated with small effusion also similar to the previous study. Vascular catheter with tip at the caval to atrial junction. Patchy opacities at the LEFT lung base. Mild gaseous distension of upper abdominal bowel loops, incomplete assessment. Limited assessment of skeletal structures without acute process. IMPRESSION: 1. Gastric tube tip remains in the distal esophagus. 2. Airspace disease at the lung bases worse on the RIGHT as before. 3. Mild gaseous distension of upper abdominal bowel loops, incomplete assessment. Electronically Signed   By: Zetta Bills M.D.   On: 11/01/2020 15:56   DG Abd 1 View  Result Date: 11/01/2020 CLINICAL DATA:  Check gastric catheter placement EXAM: ABDOMEN - 1 VIEW COMPARISON:  Film from earlier in the same day. FINDINGS: Gastric catheter is noted in the distal esophagus with the tip just above the gastroesophageal junction. This should be advanced several cm further into the stomach. IMPRESSION: Gastric catheter in the distal esophagus. This should be advanced several cm deeper into the stomach Electronically Signed   By: Inez Catalina M.D.   On: 11/01/2020 12:59   DG Abd 1 View  Result Date: 11/01/2020 CLINICAL DATA:  Orogastric tube positioning EXAM: ABDOMEN - 1 VIEW COMPARISON:  October 31, 2020 FINDINGS: Orogastric tube tip is in the distal esophagus. There is mild  dilatation of the stomach with air. No other bowel dilatation. No free air. Airspace opacity in the lung bases, more severe on the right than on the left. IMPRESSION: Orogastric tube tip in distal esophagus. Advise advancing orogastric tube approximately 12 cm. No bowel obstruction or free air.  Moderate air in stomach. Infiltrate in the lung bases,  more severe on the right than on the left. These results will be called to the ordering clinician or representative by the Radiologist Assistant, and communication documented in the PACS or [employer]. Electronically Signed   By: Lowella Grip III M.D.   On: 11/01/2020 12:21   DG Abd 1 View  Result Date: 10/31/2020 CLINICAL DATA:  Nasogastric tube placement. EXAM: ABDOMEN - 1 VIEW COMPARISON:  October 31, 2020 (8:47 p.m.) FINDINGS: A nasogastric tube is seen with its distal tip overlying the expected region of the gastroesophageal junction. Stable gastric distension is noted. The bowel gas pattern is otherwise normal. No radio-opaque calculi or other significant radiographic abnormality are seen. IMPRESSION: 1. Stable nasogastric tube positioning, as described above. Electronically Signed   By: Virgina Norfolk M.D.   On: 10/31/2020 21:53   DG Abd 1 View  Result Date: 10/31/2020 CLINICAL DATA:  59 year old male with NG placement. EXAM: ABDOMEN - 1 VIEW COMPARISON:  None. FINDINGS: Partially visualized enteric tube with side-port in the distal esophagus and tip in the region of the GE junction. Recommend further advancing of the tube by an additional 17 cm. There is gaseous distension of the stomach. IMPRESSION: Enteric tube with side-port in the distal esophagus and tip in the region of the GE junction. Recommend further advancing of the tube by an additional 17 cm. Electronically Signed   By: Anner Crete M.D.   On: 10/31/2020 20:58   CT CHEST WO CONTRAST  Result Date: 10/31/2020 CLINICAL DATA:  Chest pain and increased shortness of breath. EXAM: CT CHEST WITHOUT CONTRAST TECHNIQUE: Multidetector CT imaging of the chest was performed following the standard protocol without IV contrast. COMPARISON:  None. FINDINGS: Cardiovascular: The heart size is mildly increased. No substantial pericardial effusion. Coronary artery calcification is evident. Atherosclerotic calcification is noted in  the wall of the thoracic aorta. Mediastinum/Nodes: Scattered upper normal mediastinal lymph nodes evident. No evidence for gross hilar lymphadenopathy although assessment is limited by the lack of intravenous contrast on today's study. The esophagus has normal imaging features. There is no axillary lymphadenopathy. Lungs/Pleura: Extensive multifocal nodular and patchy airspace disease is seen in both lungs. Upper lung disease has a slight peripheral predominance and a 3.1 cm nodular consolidative opacity in the right upper lobe is cavitated (36/4). Confluent airspace disease is seen in the posterior right middle lobe with air bronchograms and relatively diffuse involvement noted right lower lobe as well. No pleural effusion. Upper Abdomen: 17 mm left adrenal nodule cannot be characterized. Liver parenchyma is heterogeneous Musculoskeletal: No worrisome lytic or sclerotic osseous abnormality. Bony mineralization suggests chronic renal disease. IMPRESSION: 1. Extensive multifocal nodular and patchy airspace disease in both lungs with a slight peripheral predominance in the upper lungs. 3.1 cm nodular consolidative opacity in the right upper lobe is cavitated. Imaging features likely related to multifocal pneumonia. Septic emboli and metastatic disease considered less likely but not excluded. 2. 17 mm left adrenal nodule cannot be characterized. MRI abdomen with and without contrast could be used to further evaluate as clinically warranted. 3. Aortic Atherosclerosis (ICD10-I70.0). Electronically Signed   By: Misty Stanley M.D.   On: 10/31/2020 11:32  DG Chest Port 1 View  Result Date: 11/01/2020 CLINICAL DATA:  59 year old male enteric tube placement. EXAM: PORTABLE CHEST 1 VIEW COMPARISON:  Portable chest 1209 hours today and earlier. FINDINGS: Portable AP semi upright view at 1536 hours. Enteric tube courses through the mediastinum into the left abdomen, tip not included. Right IJ approach dual lumen catheter  and endotracheal tube appear stable. Stable cardiac size and mediastinal contours. Stable ventilation. No pneumothorax. Confluent right lung base opacity. IMPRESSION: 1. Enteric tube now extends into the left abdomen, tip not included. 2. Otherwise stable lines and tubes. 3. Stable ventilation with right lung base consolidation. Electronically Signed   By: Genevie Ann M.D.   On: 11/01/2020 15:57   DG CHEST PORT 1 VIEW  Result Date: 11/01/2020 CLINICAL DATA:  Central catheter placement EXAM: PORTABLE CHEST 1 VIEW COMPARISON:  November 01, 2020 study obtained earlier in the day FINDINGS: Central catheter tip is in the superior vena cava. Endotracheal tube tip is 3.4 cm above the carina. Nasogastric tube tip is not seen below the level of the distal esophagus. No pneumothorax. There is patchy airspace opacity bilaterally, somewhat more severe on the right than on the left, stable. No new opacity evident. Heart upper normal in size with pulmonary vascularity normal. No adenopathy. No bone lesions. IMPRESSION: Tube and catheter positions as described without pneumothorax. Note that nasogastric tube tip is not seen beyond distal esophagus. Multifocal airspace opacity consistent with pneumonia, more severe on the right than on the left. No new opacity appreciable compared to earlier in the day. Stable cardiac prominence. Electronically Signed   By: Lowella Grip III M.D.   On: 11/01/2020 12:20   DG Chest Port 1 View  Result Date: 11/01/2020 CLINICAL DATA:  Cardiac arrest. EXAM: PORTABLE CHEST 1 VIEW COMPARISON:  One-view chest x-ray 10/31/2020 FINDINGS: Endotracheal tube is stable and in satisfactory position. Enteric tube terminates in the distal esophagus. Proximal aspect of the tube is coiled in the upper esophagus, above the thoracic inlet. Defibrillator pads are in place. Heart is enlarged. Interstitial airspace opacities are again noted bilaterally, right greater than left. Aeration is improved. IMPRESSION:  1. Stable and satisfactory positioning of the endotracheal tube. 2. Stable cardiomegaly. 3. Improved aeration bilaterally. 4. Enteric tube terminates in the distal esophagus. Proximal aspect of tube is coiled in the cervical esophagus Electronically Signed   By: San Morelle M.D.   On: 11/01/2020 04:05   DG CHEST PORT 1 VIEW  Result Date: 10/31/2020 CLINICAL DATA:  Orogastric tube placement EXAM: PORTABLE CHEST 1 VIEW COMPARISON:  Earlier today FINDINGS: Endotracheal tube with tip between the clavicular heads and carina. The orogastric tube loops through the esophagus both proximally and distally. The tip does not reach the stomach, which is gas distended since prior. Extensive infiltrate on the right more than left with possible volume loss at the right base where there is progressively dense opacity. Generous heart size. No visible pneumothorax. These results will be called to the ordering clinician or representative by the Radiologist Assistant, and communication documented in the PACS or [employer]. IMPRESSION: 1. Malpositioned orogastric tube which loops in the esophagus. The stomach is gas dilated. 2. Endotracheal tube in unremarkable position. 3. Multifocal airspace disease with progressively dense opacity at the right base. Electronically Signed   By: Monte Fantasia M.D.   On: 10/31/2020 20:18   DG CHEST PORT 1 VIEW  Result Date: 10/31/2020 CLINICAL DATA:  Right-sided chest pain and shortness of breath EXAM: PORTABLE CHEST  1 VIEW COMPARISON:  10/28/2020 FINDINGS: Cardiac shadow is enlarged but accentuated by the portable technique. New patchy infiltrate in the right mid and lower lung is noted consistent with acute pneumonia. This was not seen on the prior exam. No acute rib abnormality is noted. IMPRESSION: New patchy right mid and lower lung infiltrate. Electronically Signed   By: Inez Catalina M.D.   On: 10/31/2020 09:41   ECHOCARDIOGRAM COMPLETE  Result Date: 10/31/2020     ECHOCARDIOGRAM REPORT   Patient Name:   KYLAR LEONHARDT Date of Exam: 10/31/2020 Medical Rec #:  [id number]         Height:       71.0 in Accession #:    [id number]        Weight:       182.5 lb Date of Birth:  1961/06/21        BSA:          2.028 m Patient Age:    44 years          BP:           112/74 mmHg Patient Gender: M                 HR:           98 bpm. Exam Location:  Inpatient Procedure: 2D Echo, 3D Echo, Strain Analysis, Color Doppler and Cardiac Doppler Indications:    Acute myocardial infarction 410  History:        Patient has prior history of Echocardiogram examinations, most                 recent 11/13/2012. CHF, CAD and Acute MI, Arrythmias:Atrial                 Fibrillation; Risk Factors:Hypertension and Dyslipidemia.                 Chronic kidney disease. Ascending AoTAA. Septic shock due to                 Staphylococcus aureus.  Sonographer:    [name] RDCS Referring Phys: [id number] [facility]  Sonographer Comments: Global longitudinal strain was attempted. Cardioversion performed before echocardiogram IMPRESSIONS  1. Left ventricular ejection fraction, by estimation, is 35 to 40%. The left ventricle has moderately decreased function. The left ventricle demonstrates regional wall motion abnormalities (see scoring diagram/findings for description). There is moderate concentric left ventricular hypertrophy. Left ventricular diastolic parameters are consistent with Grade III diastolic dysfunction (restrictive). Elevated left atrial pressure. There is severe hypokinesis of the left ventricular, mid-apical inferolateral wall and inferior wall. There is moderate hypokinesis of the left ventricular, apical septal wall, anterior wall and apical segment.  2. Right ventricular systolic function is normal. The right ventricular size is normal. There is mildly elevated pulmonary artery systolic pressure. The estimated right ventricular systolic pressure is 76.5 mmHg.  3. Left atrial size was  severely dilated.  4. Right atrial size was moderately dilated.  5. By PISA, effective regurgitant orifice area is 0.4 cm, the regurgitant volume is 55 ml, regurgitant fraction 40%. The mitral valve is normal in structure. Moderate to severe mitral valve regurgitation.  6. Tricuspid valve regurgitation is mild to moderate.  7. The aortic valve is tricuspid. There is mild calcification of the aortic valve. There is moderate thickening of the aortic valve. Aortic valve regurgitation is mild. Mild to moderate aortic valve sclerosis/calcification is present, without any evidence of aortic stenosis.  8. Aortic  dilatation noted. There is mild dilatation of the ascending aorta, measuring 41 mm.  9. The inferior vena cava is dilated in size with <50% respiratory variability, suggesting right atrial pressure of 15 mmHg. Conclusion(s)/Recommendation(s): No evidence of valvular vegetations on this transthoracic echocardiogram. Would recommend a transesophageal echocardiogram to exclude infective endocarditis if clinically indicated. FINDINGS  Left Ventricle: Left ventricular ejection fraction, by estimation, is 35 to 40%. The left ventricle has moderately decreased function. The left ventricle demonstrates regional wall motion abnormalities. Severe hypokinesis of the left ventricular, mid-apical inferolateral wall and inferior wall. Moderate hypokinesis of the left ventricular, apical septal wall, anterior wall and apical segment. The left ventricular internal cavity size was normal in size. There is moderate concentric left ventricular hypertrophy. Left ventricular diastolic parameters are consistent with Grade III diastolic dysfunction (restrictive). Elevated left atrial pressure. Right Ventricle: The right ventricular size is normal. No increase in right ventricular wall thickness. Right ventricular systolic function is normal. There is mildly elevated pulmonary artery systolic pressure. The tricuspid regurgitant velocity  is 2.66  m/s, and with an assumed right atrial pressure of 15 mmHg, the estimated right ventricular systolic pressure is 09.3 mmHg. Left Atrium: Left atrial size was severely dilated. Right Atrium: Right atrial size was moderately dilated. Pericardium: There is no evidence of pericardial effusion. Mitral Valve: By PISA, effective regurgitant orifice area is 0.4 cm, the regurgitant volume is 55 ml, regurgitant fraction 40%. The mitral valve is normal in structure. There is mild thickening of the mitral valve leaflet(s). Mild to moderate mitral annular calcification. Moderate to severe mitral valve regurgitation, with eccentric posteriorly directed jet. Tricuspid Valve: The tricuspid valve is normal in structure. Tricuspid valve regurgitation is mild to moderate. Aortic Valve: The aortic valve is tricuspid. There is mild calcification of the aortic valve. There is moderate thickening of the aortic valve. Aortic valve regurgitation is mild. Mild to moderate aortic valve sclerosis/calcification is present, without any evidence of aortic stenosis. Pulmonic Valve: The pulmonic valve was grossly normal. Pulmonic valve regurgitation is not visualized. Aorta: Aortic dilatation noted. There is mild dilatation of the ascending aorta, measuring 41 mm. Venous: The inferior vena cava is dilated in size with less than 50% respiratory variability, suggesting right atrial pressure of 15 mmHg. IAS/Shunts: No atrial level shunt detected by color flow Doppler. Additional Comments: A is visualized in the right ventricle.  LEFT VENTRICLE PLAX 2D LVIDd:         5.30 cm LVIDs:         3.70 cm LV PW:         1.40 cm LV IVS:        1.80 cm LVOT diam:     2.20 cm LV SV:         79 LV SV Index:   39 LVOT Area:     3.80 cm  LV Volumes (MOD) LV vol d, MOD A2C: 253.0 ml LV vol d, MOD A4C: 227.0 ml LV vol s, MOD A2C: 148.0 ml LV vol s, MOD A4C: 143.0 ml LV SV MOD A2C:     105.0 ml LV SV MOD A4C:     227.0 ml LV SV MOD BP:      98.1 ml RIGHT  VENTRICLE RV S prime:     12.00 cm/s TAPSE (M-mode): 1.5 cm LEFT ATRIUM              Index       RIGHT ATRIUM  Index LA diam:        4.90 cm  2.42 cm/m  RA Area:     20.40 cm LA Vol (A2C):   109.0 ml 53.74 ml/m RA Volume:   59.30 ml  29.24 ml/m LA Vol (A4C):   116.0 ml 57.19 ml/m LA Biplane Vol: 114.0 ml 56.21 ml/m  AORTIC VALVE LVOT Vmax:   121.00 cm/s LVOT Vmean:  95.100 cm/s LVOT VTI:    0.207 m  AORTA Ao Root diam: 3.20 cm Ao Asc diam:  4.10 cm MITRAL VALVE                 TRICUSPID VALVE MV Area (PHT): 6.96 cm      TR Peak grad:   28.3 mmHg MV Decel Time: 109 msec      TR Vmax:        266.00 cm/s MR Peak grad:    75.9 mmHg MR Mean grad:    50.0 mmHg   SHUNTS MR Vmax:         435.50 cm/s Systemic VTI:  0.21 m MR Vmean:        333.0 cm/s  Systemic Diam: 2.20 cm MR PISA:         6.28 cm MR PISA Eff ROA: 51 mm MR PISA Radius:  1.00 cm MV E velocity: 145.33 cm/s Sanda Klein MD Electronically signed by Sanda Klein MD Signature Date/Time: 10/31/2020/2:50:12 PM    Final    ECHO TEE  Result Date: 11/01/2020    TRANSESOPHOGEAL ECHO REPORT   Patient Name:   COMER DEVINS Date of Exam: 11/01/2020 Medical Rec #:  [id number]         Height:       71.0 in Accession #:    [id number]        Weight:       182.5 lb Date of Birth:  01/29/1961        BSA:          2.028 m Patient Age:    61 years          BP:           113/73 mmHg Patient Gender: M                 HR:           77 bpm. Exam Location:  Inpatient Procedure: Transesophageal Echo Indications:    endocarditis  History:        Patient has prior history of Echocardiogram examinations, most                 recent 11/01/2020. CAD; Risk Factors:Hypertension, Dyslipidemia                 and Former Smoker. STEMI.  Sonographer:    Jannett Celestine RDCS (AE) Referring Phys: [id number] [facility] A [facility]ANDR[facility] PROCEDURE: The transesophogeal probe was passed without difficulty through the esophogus of the patient. Sedation performed by performing  physician. The patient developed no complications during the procedure. IMPRESSIONS  1. Left ventricular ejection fraction, by estimation, is 35 to 40%. The left ventricle has moderately decreased function.  2. Right ventricular systolic function is normal. The right ventricular size is normal.  3. Left atrial size was severely dilated. No left atrial/left atrial appendage thrombus was detected.  4. Right atrial size was moderately dilated.  5. The mitral valve is grossly normal. Severe mitral valve regurgitation.  6. The aortic valve is tricuspid. There is moderate calcification  of the aortic valve. Aortic valve regurgitation is moderate.  7. There is mild dilatation of the ascending aorta, measuring 40 mm. There is Moderate (Grade III) plaque.  8. The left upper pulmonary vein, left lower pulmonary vein and right upper pulmonary vein are abnormal. Conclusion(s)/Recommendation(s): There is evidence of severe mitral regurgitation; predominantely atrial functional mitral regurgitation with slight posterior tethering. There is associated moderate reduction in LV function. There is aortic regurgitation  with echodensities that could be consistent with calcification in patient with ESRD. 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 Discussed with patient and primary MD. FINDINGS  Left Ventricle: Left ventricular ejection fraction, by estimation, is 35 to 40%. The left ventricle has moderately decreased function. The left ventricular internal cavity size was normal in size. Right Ventricle: The right ventricular size is normal. Right vetricular wall thickness was not assessed. Right ventricular systolic function is normal. Left Atrium: Left atrial size was severely dilated. No left atrial/left atrial appendage thrombus was detected. Right Atrium: Right atrial size was moderately dilated. Pericardium: The pericardium was not well visualized. Mitral Valve: There is severe eccentric mitral  regurgitation. The mitral valve is grossly normal. Severe mitral valve regurgitation. Tricuspid Valve: The tricuspid valve is grossly normal. Tricuspid valve regurgitation is mild. Aortic Valve: Moderate echodensity on the aortic valve on the side of the ascending aorta, favoring calification; thought cannot exclude. The aortic valve is tricuspid. There is moderate calcification of the aortic valve. Aortic valve regurgitation is moderate. Aortic regurgitation PHT measures 393 msec. Pulmonic Valve: The pulmonic valve was grossly normal. Pulmonic valve regurgitation is not visualized. Aorta: The aortic root is normal in size and structure. There is mild dilatation of the ascending aorta, measuring 40 mm. There is moderate (Grade III) plaque. Venous: The left upper pulmonary vein, left lower pulmonary vein and right upper pulmonary vein are abnormal. IAS/Shunts: No atrial level shunt detected by color flow Doppler.  AORTIC VALVE AI PHT:      393 msec Rudean Haskell MD Electronically signed by Rudean Haskell MD Signature Date/Time: 11/01/2020/5:10:24 PM    Final         [facility] [phone] pager 11/02/2020, 8:08 AM

## 2020-11-02 NOTE — Progress Notes (Signed)
Progress Note  Patient Name: Travis [name] Date of Encounter: 11/02/2020  [facility] Cardiologist: Glenetta Hew, MD   Patient Profile     59 y.o. male with ESRD on IHD, Ascending AoTAA - admitted with  Anterior STEMI 11/21 ->  PCI of the LAD. He has residual disease in his right coronary and AVG-Cx for planned PCI.  PTA was not feeing well for several days - chills & nausea.  On Cath - LVEDP 30-35 mmHg -> had been drinking lots of H20 & in settting of ACS - acute DHF --> developed pulmonary Edema = Acute HD.   Was then febrile PM 11/20-21 - > now 2/2 BC + Staph a (1 reported MSSA).  Overnight 11/21-22 -> went into Afib RVR - became hypotensive - Now in Septic Shock. after initial restoration of sinus rhythm, went back into A. fib RVR Now in Septic Shock. ->  Chest x-ray and CT of the chest on 11/23 confirm right lower lobe staph pneumonia -> DCCV on 10/31/2020-> followed by intubation by late afternoon. Overnight 11/23-24, had significant agitation requiring sedation that led to PEA arrest with short CPR and ROSC.  Principal Problem:   Acute ST elevation myocardial infarction (STEMI) of anterolateral wall (HCC) Active Problems:   ESRD (end stage renal disease) ([place])   3 V- CAD w/ ACS/STEMI: Culprit = 99% mLAD @ D1 (DES PCI jailing D1); 99% ost-AVGLCx, 80% calcified napkin ring prox RCA.    Presence of drug coated stent in LAD coronary artery: Resolute Onyx DES 2.75 mm x 18 mm (3.1 mm) at major D1 & SP1.   Acute combined systolic and diastolic heart failure (HCC)   Atrial fibrillation with RVR (HCC)   MSSA bacteremia   Septic shock due to Staphylococcus aureus (HCC)   Pneumonia of right lower lobe due to methicillin susceptible Staphylococcus aureus (MSSA) (HCC)   Severe mitral regurgitation   Hyperlipidemia with target LDL less than 70   Essential hypertension   Cardiac arrest ([place])   Subjective   Much more stable afternoon and night.  Only major issue was that they  were not able to get the OG tube in place.  Therefore had to convert from oral medications to IV including Brilinta to cangrelor, aspirin and PR, and continue IV amiodarone.  Inpatient Medications    Scheduled Meds: . sodium chloride   Intravenous Once  . aspirin  150 mg Rectal Daily  . atorvastatin  80 mg Oral q1800  . chlorhexidine gluconate (MEDLINE KIT)  15 mL Mouth Rinse BID  . Chlorhexidine Gluconate Cloth  6 each Topical Q0600  . docusate  100 mg Per Tube BID  . doxercalciferol  5 mcg Intravenous Q M,W,F-HD  . ferric citrate  210 mg Oral With snacks  . ferric citrate  630 mg Oral TID with meals  . hydrocortisone sod succinate (SOLU-CORTEF) inj  50 mg Intravenous Q8H  . lidocaine  1 patch Transdermal Q24H  . mouth rinse  15 mL Mouth Rinse 10 times per day  . polyethylene glycol  17 g Per Tube Daily  . sodium chloride flush  10-40 mL Intracatheter Q12H  . sodium chloride flush  3 mL Intravenous Q12H  . Thrombi-Pad  1 each Topical Once   Continuous Infusions: .  prismasol BGK 4/2.5 400 mL/hr at 11/01/20 1500  .  prismasol BGK 4/2.5 200 mL/hr at 11/01/20 1500  . sodium chloride Stopped (11/02/20 0811)  . sodium chloride Stopped (10/28/20 0954)  . sodium chloride  10 mL/hr at 11/02/20 0900  . amiodarone 30 mg/hr (11/02/20 0900)  . cangrelor 50 mg in NS 250 mL 0.75 mcg/kg/min (11/02/20 0900)  .  ceFAZolin (ANCEF) IV    . famotidine (PEPCID) IV 10 mg (11/02/20 0904)  . heparin 1,400 Units/hr (11/02/20 0900)  . HYDROmorphone 1.5 mg/hr (11/02/20 0900)  . nitroGLYCERIN Stopped (10/28/20 2100)  . norepinephrine (LEVOPHED) Adult infusion 36 mcg/min (11/02/20 0900)  . prismasol BGK 2/2.5 dialysis solution 2,000 mL/hr at 11/02/20 0918  . propofol (DIPRIVAN) infusion 45 mcg/kg/min (11/02/20 0905)  . vasopressin 0.03 Units/min (11/02/20 0900)   PRN Meds: sodium chloride, acetaminophen, heparin, HYDROmorphone, HYDROmorphone (DILAUDID) injection, HYDROmorphone (DILAUDID) injection,  LORazepam, midazolam, nitroGLYCERIN, ondansetron (ZOFRAN) IV, sodium chloride, sodium chloride flush, sodium chloride flush   Vital Signs    Vitals:   11/02/20 0900 11/02/20 0915 11/02/20 0930 11/02/20 0945  BP:      Pulse: 73 73 73 74  Resp: _0 Temp:      TempSrc:      SpO2: 100% 100% 100% 100%  Weight:      Height:        Intake/Output Summary (Last 24 hours) at 11/02/2020 1004 Last data filed at 11/02/2020 0900 Gross per 24 hour  Intake 3181.85 ml  Output 2276 ml  Net 905.85 ml   Last 3 Weights 11/02/2020 10/29/2020 10/28/2020  Weight (lbs) 179 lb 7.3 oz 182 lb 8.7 oz 190 lb 7.6 oz  Weight (kg) 81.4 kg 82.8 kg 86.4 kg      Telemetry    Afib RVR 110-130 bpm - Personally Reviewed  ECG    AFib RVR 129 bpm.  LAFB (-45). Evolutionary changes of Anterior STEMI - STE  Still elevated with TWI V2 *V4, TWI I & aVL. - Personally Reviewed  Physical Exam   GEN:  Intubated and sedated. Neck:  He clearly has pulsatile jugular veins is seen by his IJ line moving, but not fully able to sit assess. Cardiac:  RRR, normal S1-S2.  2/6 HSM apex. Respiratory:  Mild coarse upper respiratory breath sounds, but more prominent rhonchorous breath sounds in the right base.  But improved. GI:  Soft/NT/ND.  Reduced bowel sounds. MS:  No C/C/E.Marland Kitchen    L FV TLC central line in place -> no bleeding or bruising.    RFA acces site stable.  No hematoma. Neuro:   Easily agitated and was delirious this morning.  Better with oxygenation. Psych: Anxious, but normal affect.  Labs    High Sensitivity Troponin:   Recent Labs  Lab 10/28/20 0629 10/28/20 1046 10/28/20 2040 10/29/20 2121 10/30/20 0548  TROPONINIHS 3,155* >27,000* >27,000* >27,000* >27,000*      Chemistry Recent Labs  Lab 10/28/20 0604 10/29/20 0101 10/30/20 0548 10/31/20 0830 11/01/20 0430 11/01/20 0430 11/01/20 1514 11/01/20 2048 11/02/20 0301  NA 133*   < > 125*   < > 137   < > 138 133* 136  K 3.7   < > 5.2*    < > 5.0   < > 5.4* 5.1 4.9  CL 86*   < > 86*   < > 95*   < > 92* 95* 98  CO2 31   < > 22   < > 23   < > 24 20* 23  GLUCOSE 140*   < > 148*   < > 169*   < > 88 103* 99  BUN 30*   < > 75*   < > 82*   < >  88* 75* 60*  CREATININE 9.73*   < > 11.18*   < > 10.59*   < > 10.91* 8.15* 6.78*  CALCIUM 9.8   < > 9.1   < > 8.7*   < > 9.0 8.9 9.1  PROT 6.6  --  5.6*  --   --   --   --   --   --   ALBUMIN 3.1*  --  2.2*   < > 1.6*  --  1.7*  --  1.6*  AST 84*  --  87*  --   --   --   --   --   --   ALT 87*  --  51*  --   --   --   --   --   --   ALKPHOS 146*  --  95  --   --   --   --   --   --   BILITOT 1.2  --  1.5*  --   --   --   --   --   --   GFRNONAA 6*   < > 5*   < > 5*   < > 5* 7* 9*  ANIONGAP 16*   < > 17*   < > 19*   < > 22* 18* 15   < > = values in this interval not displayed.     Hematology Recent Labs  Lab 11/01/20 0430 11/01/20 0430 11/01/20 1454 11/01/20 2045 11/02/20 0301  WBC 14.7*  --   --  28.1* 27.9*  RBC 2.00*  --   --  2.33* 2.30*  HGB 6.1*   < > 7.3* 7.6* 7.5*  HCT 19.3*   < > 21.7* 21.6* 22.0*  MCV 96.5  --   --  92.7 95.7  MCH 30.5  --   --  32.6 32.6  MCHC 31.6  --   --  35.2 34.1  RDW 15.9*  --   --  15.7* 15.9*  PLT 234  --   --  291 295   < > = values in this interval not displayed.    BNPNo results for input(s): BNP, PROBNP in the last 168 hours.   DDimer No results for input(s): DDIMER in the last 168 hours.   Radiology    CT ABDOMEN PELVIS WO CONTRAST  Result Date: 11/01/2020 CLINICAL DATA:  Anemia, dropping hemoglobin, concern for retroperitoneal hematoma following cardiac catheterization EXAM: CT ABDOMEN AND PELVIS WITHOUT CONTRAST TECHNIQUE: Multidetector CT imaging of the abdomen and pelvis was performed following the standard protocol without IV contrast. COMPARISON:  None. FINDINGS: Lower chest: There are small bilateral pleural effusions and associated atelectasis or consolidation, with a significant amount of somewhat nodular heterogeneous  airspace disease present in the right greater than left lower lobes. Cardiomegaly. Three-vessel coronary artery calcifications. Hepatobiliary: No solid liver abnormality is seen. Excreted contrast in the gallbladder. No gallstones, gallbladder wall thickening, or biliary dilatation. Pancreas: Unremarkable. No pancreatic ductal dilatation or surrounding inflammatory changes. Spleen: Normal in size without significant abnormality. Adrenals/Urinary Tract: Adrenal glands are unremarkable. Status post right nephrectomy. The left kidney is atrophic. Bladder is unremarkable. Stomach/Bowel: Stomach is within normal limits. Appendix appears normal. No evidence of bowel wall thickening, distention, or inflammatory changes. Colonic diverticulosis. Vascular/Lymphatic: Aortic atherosclerosis. Left femoral venous catheter. No enlarged abdominal or pelvic lymph nodes. Reproductive: No mass or other significant abnormality. Other: Mild anasarca.  No abdominopelvic ascites. Musculoskeletal: No acute or significant osseous findings. Renal  osteodystrophy. IMPRESSION: 1. No evidence of retroperitoneal hematoma or other nidus of hemorrhage. 2. There are small bilateral pleural effusions and associated atelectasis or consolidation, with a significant amount of somewhat nodular heterogeneous airspace disease present in the right greater than left lower lobes. Findings are concerning for infection or aspiration 3. Cardiomegaly and coronary artery disease. 4. Status post right nephrectomy. Aortic Atherosclerosis (ICD10-I70.0). Electronically Signed   By: Eddie Candle M.D.   On: 11/01/2020 10:58   DG Abd 1 View  Result Date: 11/01/2020 CLINICAL DATA:  Orogastric tube placement EXAM: ABDOMEN - 1 VIEW COMPARISON:  Multiple prior abdominal studies and chest x-ray of the same date. FINDINGS: Gastric tube tip in the distal esophagus. Similar to the prior exam. Consolidative changes at the RIGHT lung base likely associated with small effusion  also similar to the previous study. Vascular catheter with tip at the caval to atrial junction. Patchy opacities at the LEFT lung base. Mild gaseous distension of upper abdominal bowel loops, incomplete assessment. Limited assessment of skeletal structures without acute process. IMPRESSION: 1. Gastric tube tip remains in the distal esophagus. 2. Airspace disease at the lung bases worse on the RIGHT as before. 3. Mild gaseous distension of upper abdominal bowel loops, incomplete assessment. Electronically Signed   By: Zetta Bills M.D.   On: 11/01/2020 15:56   DG Abd 1 View  Result Date: 11/01/2020 CLINICAL DATA:  Check gastric catheter placement EXAM: ABDOMEN - 1 VIEW COMPARISON:  Film from earlier in the same day. FINDINGS: Gastric catheter is noted in the distal esophagus with the tip just above the gastroesophageal junction. This should be advanced several cm further into the stomach. IMPRESSION: Gastric catheter in the distal esophagus. This should be advanced several cm deeper into the stomach Electronically Signed   By: Inez Catalina M.D.   On: 11/01/2020 12:59   DG Abd 1 View  Result Date: 11/01/2020 CLINICAL DATA:  Orogastric tube positioning EXAM: ABDOMEN - 1 VIEW COMPARISON:  October 31, 2020 FINDINGS: Orogastric tube tip is in the distal esophagus. There is mild dilatation of the stomach with air. No other bowel dilatation. No free air. Airspace opacity in the lung bases, more severe on the right than on the left. IMPRESSION: Orogastric tube tip in distal esophagus. Advise advancing orogastric tube approximately 12 cm. No bowel obstruction or free air.  Moderate air in stomach. Infiltrate in the lung bases, more severe on the right than on the left. These results will be called to the ordering clinician or representative by the Radiologist Assistant, and communication documented in the PACS or [employer]. Electronically Signed   By: Lowella Grip III M.D.   On: 11/01/2020 12:21    DG Abd 1 View  Result Date: 10/31/2020 CLINICAL DATA:  Nasogastric tube placement. EXAM: ABDOMEN - 1 VIEW COMPARISON:  October 31, 2020 (8:47 p.m.) FINDINGS: A nasogastric tube is seen with its distal tip overlying the expected region of the gastroesophageal junction. Stable gastric distension is noted. The bowel gas pattern is otherwise normal. No radio-opaque calculi or other significant radiographic abnormality are seen. IMPRESSION: 1. Stable nasogastric tube positioning, as described above. Electronically Signed   By: Virgina Norfolk M.D.   On: 10/31/2020 21:53   DG Abd 1 View  Result Date: 10/31/2020 CLINICAL DATA:  59 year old male with NG placement. EXAM: ABDOMEN - 1 VIEW COMPARISON:  None. FINDINGS: Partially visualized enteric tube with side-port in the distal esophagus and tip in the region of the GE junction. Recommend  further advancing of the tube by an additional 17 cm. There is gaseous distension of the stomach. IMPRESSION: Enteric tube with side-port in the distal esophagus and tip in the region of the GE junction. Recommend further advancing of the tube by an additional 17 cm. Electronically Signed   By: Anner Crete M.D.   On: 10/31/2020 20:58   CT CHEST WO CONTRAST  Result Date: 10/31/2020 CLINICAL DATA:  Chest pain and increased shortness of breath. EXAM: CT CHEST WITHOUT CONTRAST TECHNIQUE: Multidetector CT imaging of the chest was performed following the standard protocol without IV contrast. COMPARISON:  None. FINDINGS: Cardiovascular: The heart size is mildly increased. No substantial pericardial effusion. Coronary artery calcification is evident. Atherosclerotic calcification is noted in the wall of the thoracic aorta. Mediastinum/Nodes: Scattered upper normal mediastinal lymph nodes evident. No evidence for gross hilar lymphadenopathy although assessment is limited by the lack of intravenous contrast on today's study. The esophagus has normal imaging features. There  is no axillary lymphadenopathy. Lungs/Pleura: Extensive multifocal nodular and patchy airspace disease is seen in both lungs. Upper lung disease has a slight peripheral predominance and a 3.1 cm nodular consolidative opacity in the right upper lobe is cavitated (36/4). Confluent airspace disease is seen in the posterior right middle lobe with air bronchograms and relatively diffuse involvement noted right lower lobe as well. No pleural effusion. Upper Abdomen: 17 mm left adrenal nodule cannot be characterized. Liver parenchyma is heterogeneous Musculoskeletal: No worrisome lytic or sclerotic osseous abnormality. Bony mineralization suggests chronic renal disease. IMPRESSION: 1. Extensive multifocal nodular and patchy airspace disease in both lungs with a slight peripheral predominance in the upper lungs. 3.1 cm nodular consolidative opacity in the right upper lobe is cavitated. Imaging features likely related to multifocal pneumonia. Septic emboli and metastatic disease considered less likely but not excluded. 2. 17 mm left adrenal nodule cannot be characterized. MRI abdomen with and without contrast could be used to further evaluate as clinically warranted. 3. Aortic Atherosclerosis (ICD10-I70.0). Electronically Signed   By: Misty Stanley M.D.   On: 10/31/2020 11:32   DG Chest Port 1 View  Result Date: 11/01/2020 CLINICAL DATA:  59 year old male enteric tube placement. EXAM: PORTABLE CHEST 1 VIEW COMPARISON:  Portable chest 1209 hours today and earlier. FINDINGS: Portable AP semi upright view at 1536 hours. Enteric tube courses through the mediastinum into the left abdomen, tip not included. Right IJ approach dual lumen catheter and endotracheal tube appear stable. Stable cardiac size and mediastinal contours. Stable ventilation. No pneumothorax. Confluent right lung base opacity. IMPRESSION: 1. Enteric tube now extends into the left abdomen, tip not included. 2. Otherwise stable lines and tubes. 3. Stable  ventilation with right lung base consolidation. Electronically Signed   By: Genevie Ann M.D.   On: 11/01/2020 15:57   DG CHEST PORT 1 VIEW  Result Date: 11/01/2020 CLINICAL DATA:  Central catheter placement EXAM: PORTABLE CHEST 1 VIEW COMPARISON:  November 01, 2020 study obtained earlier in the day FINDINGS: Central catheter tip is in the superior vena cava. Endotracheal tube tip is 3.4 cm above the carina. Nasogastric tube tip is not seen below the level of the distal esophagus. No pneumothorax. There is patchy airspace opacity bilaterally, somewhat more severe on the right than on the left, stable. No new opacity evident. Heart upper normal in size with pulmonary vascularity normal. No adenopathy. No bone lesions. IMPRESSION: Tube and catheter positions as described without pneumothorax. Note that nasogastric tube tip is not seen beyond distal esophagus.  Multifocal airspace opacity consistent with pneumonia, more severe on the right than on the left. No new opacity appreciable compared to earlier in the day. Stable cardiac prominence. Electronically Signed   By: Lowella Grip III M.D.   On: 11/01/2020 12:20   DG Chest Port 1 View  Result Date: 11/01/2020 CLINICAL DATA:  Cardiac arrest. EXAM: PORTABLE CHEST 1 VIEW COMPARISON:  One-view chest x-ray 10/31/2020 FINDINGS: Endotracheal tube is stable and in satisfactory position. Enteric tube terminates in the distal esophagus. Proximal aspect of the tube is coiled in the upper esophagus, above the thoracic inlet. Defibrillator pads are in place. Heart is enlarged. Interstitial airspace opacities are again noted bilaterally, right greater than left. Aeration is improved. IMPRESSION: 1. Stable and satisfactory positioning of the endotracheal tube. 2. Stable cardiomegaly. 3. Improved aeration bilaterally. 4. Enteric tube terminates in the distal esophagus. Proximal aspect of tube is coiled in the cervical esophagus Electronically Signed   By: San Morelle M.D.   On: 11/01/2020 04:05   DG CHEST PORT 1 VIEW  Result Date: 10/31/2020 CLINICAL DATA:  Orogastric tube placement EXAM: PORTABLE CHEST 1 VIEW COMPARISON:  Earlier today FINDINGS: Endotracheal tube with tip between the clavicular heads and carina. The orogastric tube loops through the esophagus both proximally and distally. The tip does not reach the stomach, which is gas distended since prior. Extensive infiltrate on the right more than left with possible volume loss at the right base where there is progressively dense opacity. Generous heart size. No visible pneumothorax. These results will be called to the ordering clinician or representative by the Radiologist Assistant, and communication documented in the PACS or [employer]. IMPRESSION: 1. Malpositioned orogastric tube which loops in the esophagus. The stomach is gas dilated. 2. Endotracheal tube in unremarkable position. 3. Multifocal airspace disease with progressively dense opacity at the right base. Electronically Signed   By: Monte Fantasia M.D.   On: 10/31/2020 20:18   ECHOCARDIOGRAM COMPLETE  Result Date: 10/31/2020    ECHOCARDIOGRAM REPORT   Patient Name:   Travis [name] Date of Exam: 10/31/2020 Medical Rec #:  [id number]         Height:       71.0 in Accession #:    [id number]        Weight:       182.5 lb Date of Birth:  1961/07/28        BSA:          2.028 m Patient Age:    67 years          BP:           112/74 mmHg Patient Gender: M                 HR:           98 bpm. Exam Location:  Inpatient Procedure: 2D Echo, 3D Echo, Strain Analysis, Color Doppler and Cardiac Doppler Indications:    Acute myocardial infarction 410  History:        Patient has prior history of Echocardiogram examinations, most                 recent 11/13/2012. CHF, CAD and Acute MI, Arrythmias:Atrial                 Fibrillation; Risk Factors:Hypertension and Dyslipidemia.                 Chronic kidney disease. Ascending AoTAA. Septic shock  due to  Staphylococcus aureus.  Sonographer:    [name] RDCS Referring Phys: [id number] [place] [name]  Sonographer Comments: Global longitudinal strain was attempted. Cardioversion performed before echocardiogram IMPRESSIONS  1. Left ventricular ejection fraction, by estimation, is 35 to 40%. The left ventricle has moderately decreased function. The left ventricle demonstrates regional wall motion abnormalities (see scoring diagram/findings for description). There is moderate concentric left ventricular hypertrophy. Left ventricular diastolic parameters are consistent with Grade III diastolic dysfunction (restrictive). Elevated left atrial pressure. There is severe hypokinesis of the left ventricular, mid-apical inferolateral wall and inferior wall. There is moderate hypokinesis of the left ventricular, apical septal wall, anterior wall and apical segment.  2. Right ventricular systolic function is normal. The right ventricular size is normal. There is mildly elevated pulmonary artery systolic pressure. The estimated right ventricular systolic pressure is 73.4 mmHg.  3. Left atrial size was severely dilated.  4. Right atrial size was moderately dilated.  5. By PISA, effective regurgitant orifice area is 0.4 cm, the regurgitant volume is 55 ml, regurgitant fraction 40%. The mitral valve is normal in structure. Moderate to severe mitral valve regurgitation.  6. Tricuspid valve regurgitation is mild to moderate.  7. The aortic valve is tricuspid. There is mild calcification of the aortic valve. There is moderate thickening of the aortic valve. Aortic valve regurgitation is mild. Mild to moderate aortic valve sclerosis/calcification is present, without any evidence of aortic stenosis.  8. Aortic dilatation noted. There is mild dilatation of the ascending aorta, measuring 41 mm.  9. The inferior vena cava is dilated in size with <50% respiratory variability, suggesting right atrial pressure of 15 mmHg.  Conclusion(s)/Recommendation(s): No evidence of valvular vegetations on this transthoracic echocardiogram. Would recommend a transesophageal echocardiogram to exclude infective endocarditis if clinically indicated. FINDINGS  Left Ventricle: Left ventricular ejection fraction, by estimation, is 35 to 40%. The left ventricle has moderately decreased function. The left ventricle demonstrates regional wall motion abnormalities. Severe hypokinesis of the left ventricular, mid-apical inferolateral wall and inferior wall. Moderate hypokinesis of the left ventricular, apical septal wall, anterior wall and apical segment. The left ventricular internal cavity size was normal in size. There is moderate concentric left ventricular hypertrophy. Left ventricular diastolic parameters are consistent with Grade III diastolic dysfunction (restrictive). Elevated left atrial pressure. Right Ventricle: The right ventricular size is normal. No increase in right ventricular wall thickness. Right ventricular systolic function is normal. There is mildly elevated pulmonary artery systolic pressure. The tricuspid regurgitant velocity is 2.66  m/s, and with an assumed right atrial pressure of 15 mmHg, the estimated right ventricular systolic pressure is 28.7 mmHg. Left Atrium: Left atrial size was severely dilated. Right Atrium: Right atrial size was moderately dilated. Pericardium: There is no evidence of pericardial effusion. Mitral Valve: By PISA, effective regurgitant orifice area is 0.4 cm, the regurgitant volume is 55 ml, regurgitant fraction 40%. The mitral valve is normal in structure. There is mild thickening of the mitral valve leaflet(s). Mild to moderate mitral annular calcification. Moderate to severe mitral valve regurgitation, with eccentric posteriorly directed jet. Tricuspid Valve: The tricuspid valve is normal in structure. Tricuspid valve regurgitation is mild to moderate. Aortic Valve: The aortic valve is tricuspid. There  is mild calcification of the aortic valve. There is moderate thickening of the aortic valve. Aortic valve regurgitation is mild. Mild to moderate aortic valve sclerosis/calcification is present, without any evidence of aortic stenosis. Pulmonic Valve: The pulmonic valve was grossly normal. Pulmonic valve regurgitation is not visualized. Aorta: Aortic  dilatation noted. There is mild dilatation of the ascending aorta, measuring 41 mm. Venous: The inferior vena cava is dilated in size with less than 50% respiratory variability, suggesting right atrial pressure of 15 mmHg. IAS/Shunts: No atrial level shunt detected by color flow Doppler. Additional Comments: A is visualized in the right ventricle.  LEFT VENTRICLE PLAX 2D LVIDd:         5.30 cm LVIDs:         3.70 cm LV PW:         1.40 cm LV IVS:        1.80 cm LVOT diam:     2.20 cm LV SV:         79 LV SV Index:   39 LVOT Area:     3.80 cm  LV Volumes (MOD) LV vol d, MOD A2C: 253.0 ml LV vol d, MOD A4C: 227.0 ml LV vol s, MOD A2C: 148.0 ml LV vol s, MOD A4C: 143.0 ml LV SV MOD A2C:     105.0 ml LV SV MOD A4C:     227.0 ml LV SV MOD BP:      98.1 ml RIGHT VENTRICLE RV S prime:     12.00 cm/s TAPSE (M-mode): 1.5 cm LEFT ATRIUM              Index       RIGHT ATRIUM           Index LA diam:        4.90 cm  2.42 cm/m  RA Area:     20.40 cm LA Vol (A2C):   109.0 ml 53.74 ml/m RA Volume:   59.30 ml  29.24 ml/m LA Vol (A4C):   116.0 ml 57.19 ml/m LA Biplane Vol: 114.0 ml 56.21 ml/m  AORTIC VALVE LVOT Vmax:   121.00 cm/s LVOT Vmean:  95.100 cm/s LVOT VTI:    0.207 m  AORTA Ao Root diam: 3.20 cm Ao Asc diam:  4.10 cm MITRAL VALVE                 TRICUSPID VALVE MV Area (PHT): 6.96 cm      TR Peak grad:   28.3 mmHg MV Decel Time: 109 msec      TR Vmax:        266.00 cm/s MR Peak grad:    75.9 mmHg MR Mean grad:    50.0 mmHg   SHUNTS MR Vmax:         435.50 cm/s Systemic VTI:  0.21 m MR Vmean:        333.0 cm/s  Systemic Diam: 2.20 cm MR PISA:         6.28 cm MR PISA Eff  ROA: 51 mm MR PISA Radius:  1.00 cm MV E velocity: 145.33 cm/s Sanda Klein MD Electronically signed by Sanda Klein MD Signature Date/Time: 10/31/2020/2:50:12 PM    Final    ECHO TEE  Result Date: 11/01/2020    TRANSESOPHOGEAL ECHO REPORT   Patient Name:   MATE ALEGRIA Date of Exam: 11/01/2020 Medical Rec #:  [id number]         Height:       71.0 in Accession #:    [id number]        Weight:       182.5 lb Date of Birth:  1961/09/28        BSA:          2.028 m Patient Age:    28  years          BP:           113/73 mmHg Patient Gender: M                 HR:           77 bpm. Exam Location:  Inpatient Procedure: Transesophageal Echo Indications:    endocarditis  History:        Patient has prior history of Echocardiogram examinations, most                 recent 11/01/2020. CAD; Risk Factors:Hypertension, Dyslipidemia                 and Former Smoker. STEMI.  Sonographer:    Jannett Celestine RDCS (AE) Referring Phys: [id number] [place] [name] A CHANDRASEKHAR PROCEDURE: The transesophogeal probe was passed without difficulty through the esophogus of the patient. Sedation performed by performing physician. The patient developed no complications during the procedure. IMPRESSIONS  1. Left ventricular ejection fraction, by estimation, is 35 to 40%. The left ventricle has moderately decreased function.  2. Right ventricular systolic function is normal. The right ventricular size is normal.  3. Left atrial size was severely dilated. No left atrial/left atrial appendage thrombus was detected.  4. Right atrial size was moderately dilated.  5. The mitral valve is grossly normal. Severe mitral valve regurgitation.  6. The aortic valve is tricuspid. There is moderate calcification of the aortic valve. Aortic valve regurgitation is moderate.  7. There is mild dilatation of the ascending aorta, measuring 40 mm. There is Moderate (Grade III) plaque.  8. The left upper pulmonary vein, left lower pulmonary vein and right upper  pulmonary vein are abnormal. Conclusion(s)/Recommendation(s): There is evidence of severe mitral regurgitation; predominantely atrial functional mitral regurgitation with slight posterior tethering. There is associated moderate reduction in LV function. There is aortic regurgitation  with echodensities that could be consistent with calcification in patient with ESRD. 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 Discussed with patient and primary MD. FINDINGS  Left Ventricle: Left ventricular ejection fraction, by estimation, is 35 to 40%. The left ventricle has moderately decreased function. The left ventricular internal cavity size was normal in size. Right Ventricle: The right ventricular size is normal. Right vetricular wall thickness was not assessed. Right ventricular systolic function is normal. Left Atrium: Left atrial size was severely dilated. No left atrial/left atrial appendage thrombus was detected. Right Atrium: Right atrial size was moderately dilated. Pericardium: The pericardium was not well visualized. Mitral Valve: There is severe eccentric mitral regurgitation. The mitral valve is grossly normal. Severe mitral valve regurgitation. Tricuspid Valve: The tricuspid valve is grossly normal. Tricuspid valve regurgitation is mild. Aortic Valve: Moderate echodensity on the aortic valve on the side of the ascending aorta, favoring calification; thought cannot exclude. The aortic valve is tricuspid. There is moderate calcification of the aortic valve. Aortic valve regurgitation is moderate. Aortic regurgitation PHT measures 393 msec. Pulmonic Valve: The pulmonic valve was grossly normal. Pulmonic valve regurgitation is not visualized. Aorta: The aortic root is normal in size and structure. There is mild dilatation of the ascending aorta, measuring 40 mm. There is moderate (Grade III) plaque. Venous: The left upper pulmonary vein, left lower pulmonary vein and right upper pulmonary  vein are abnormal. IAS/Shunts: No atrial level shunt detected by color flow Doppler.  AORTIC VALVE AI PHT:      393 msec Rudean Haskell MD Electronically signed by Lyda Kalata  Chandrasekhar MD Signature Date/Time: 11/01/2020/5:10:24 PM    Final     Cardiac Studies    Cath PCI 11/20: SUMMARY  Acute anterolateral ST elevation MI with Culprit lesion being the 99% mid LAD lesion (just past major 1st Diag/D1) along with multivessel CAD including 99% ostial AV groove LCx and 80% calcified napkin ring proximal RCA lesion with extensive calcification throughout the RCA. ? Successful PTCA and DES PCI of the LAD crossing D1 -resolute Onyx DES 2.75 mm x 18 mm postdilated to 3.1 mm. ? With PCI, there was stabilization of significant ectopy, AIVR and PVCs  Mild to moderate reduced EF with anterior anterolateral hypokinesis, EF roughly 40 to 45%.  Initial evaluation suggested normal EDP, but on recheck post PCI LVEDP of 30 mmHg, severely elevated consistent with ACUTE DIASTOLIC HEART FAILURE  Significantly dilated aortic root requiring AL-1 guide catheter for both Left and Right Coronary Angiography.  RECOMMENDATIONS  Admit to CCU, restart IV heparin 8 hours after sheath removal  Check 2D echo for better assessment of EF  Plan to review cath films with other IC cardiologist, anticipate staged PCI of at least the RCA which may require atherectomy versus lithotripsy, and likely the ostial AV groove circumflex.  If he has signs of dyspnea, consider earlier dialysis than tomorrow. ->  Would consult for nephrology to see today.  He is already on labetalol and Norvasc.  We will continue current home medications.  Uninterrupted DAPT times X 1 year.    TTE 10/31/2020: EF 35 to 40%. Moderate decreased function. Moderate concentric LVH GR 3 DD. Severe HK of the mid apical inferolateral and inferior wall with moderate HK of the apical septal and anterior wall. PA pressure estimated 43 mmHg. Severe LA  dilation. Moderate RA dilation. Moderate to severe MR. Aortic root dilated at 41 mm. RV PSP 15 mmHg.   LFemV Central line placed by [facility] 11/21    TEE 11/01/2020: EF 35-40% moderate decreased function. Severe LA dilation. No LAA thrombus. Moderate RA dilation. 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 Mild dilation of ascending aorta measuring 40 mm. Moderate grade 3 plaque..   11/01/2020: Right IJ dialysis catheter placed, right radial arterial line placed.  Assessment & Plan    Principal Problem:   Acute ST elevation myocardial infarction (STEMI) of anterolateral wall (HCC) Active Problems:   ESRD (end stage renal disease) ([place])   3 V- CAD w/ ACS/STEMI: Culprit = 99% mLAD @ D1 (DES PCI jailing D1); 99% ost-AVGLCx, 80% calcified napkin ring prox RCA.    Presence of drug coated stent in LAD coronary artery: Resolute Onyx DES 2.75 mm x 18 mm (3.1 mm) at major D1 & SP1.   Acute combined systolic and diastolic heart failure (HCC)   Atrial fibrillation with RVR (HCC)   MSSA bacteremia   Septic shock due to Staphylococcus aureus (HCC)   Pneumonia of right lower lobe due to methicillin susceptible Staphylococcus aureus (MSSA) (HCC)   Severe mitral regurgitation   Hyperlipidemia with target LDL less than 70   Essential hypertension   Cardiac arrest ([place])  Principal Problem:   Acute ST elevation myocardial infarction (STEMI) of anterolateral wall (HCC) /  3 V- CAD w/ ACS/STEMI: Culprit = 99% mLAD @ D1 (DES PCI jailing D1); 99% ost-AVGLCx, 80% calcified napkin ring prox RCA. /  Presence of drug coated stent in LAD coronary artery: Resolute Onyx DES 2.75  mm x 18 mm (3.1 mm) at major D1 & SP1. PCI of LAD- with planned staged PCI of RCA & AVG Cx-  Will have to put on hold given MSSA bacteremia  Continue IV heparin for combination CAD and A. fib  (no signs of bleeding despite drop in hemoglobin)  Currently not able to get high-dose statin because of no OG tube.  Once able to replace NG tube, would restart.  Currently Brilinta -substituted with IV Kengreal, p.o. aspirin as suppository  In the setting of septic shock, and on pressors.  Beta-blocker not restarted.  Once able to convert back to p.o. or per tube enteral medications, will restart aspirin and likely convert wit with Plavix load to Plavix in light of the fact that we would likely need to have warfarin plus Plavix going forward.    End stage renal disease on dialysis [name]) - on HD M-W-F (required urgent cath on 11/20 PM 2/2 volume overload & Acute DHF- 4 L off).  Nephrology consult on board  Started on CRRT yesterday with gentle volume removal.  Requiring additional pressor support with sedation to allow blood pressure for CRRT.    Acute combined systolic and diastolic heart failure (HCC) (complicated with Volume overload & accelerated HTN) --> resolved with HD on Saturday post Cath. ->  Now confirmed Severe MR (likely functional)  Co-Ox yesterday more suggestive of septic versus cardiogenic shock. ->  However as time goes by, may need to consider additional inotropic appropriate support.  Volume control with HD/CRRT.  Hopefully as we are able to remove more volume and control blood pressure, this functional MR will improve.    Atrial fibrillation with RVR ([place]) - initially had urgent DCCV; initially back to SR shortly & retured to Afib RVR currently in 110-120s --> successful DCCV on 11/23, now maintaining sinus rhythm.  Remains on IV amiodarone despite plans to change to p.o. because of inability to place OG tube. ->  Once able to provide enteral medications, will convert from IV to oral.  Remains on IV heparin, once his infection clears reviewed determining timing of potential staged PCI (however this will likely be delayed to allow for time for full antibiotic course).   If that is the case, we would probably start him on warfarin prior to discharge.  At that time would stop aspirin and continue with Plavix and warfarin.    Bacteremia due to Staphylococcus - MSSA; Septic shock due to Staphylococcus aureus (HCC)/right lower lobe staph pneumonia- PCCM consult & ID counsulted -> WBC elevated Febrile to 102.5 on 11/21 PM - current T max 98.4  Initial set blood cultures: 2/2 BC + for MSSA = started on Vanc-Zosyn -remains on cefazolin.  Now with staph pneumonia, will have at least 4 (if not 6) weeks of antibiotics.  Follow-up BC x2 from both 11/23 and 11/24 NGTD  Sputum does also show positive staph not unexpected given staph pneumonia.  Remains on Levophed and vasopressin with Levophed dose increased due to sedation requirement and CRRT  Chest x-ray today shows some worsening right lower lobe pneumonia, plan.  PCCM will be to start percussive therapy for pulmonary toilet.  TEE performed yesterday-no signs of vegetation: Antibiotics per PCCM and ID    Hyperlipidemia with target LDL less than 70:  High dose statin started.      Essential hypertension -remains on pressors.    Anemia - Hgb 9.5 on admission, dropped to 6.1, now after 1 unit of blood, up to 7.5 today stable  x24 hours.  Does not appear to be bleeding.; on ESA per Nephrology  May require additional transfusion versus IV iron per nephrology.   I discussed with Dr. Lynetta Mare from PCCM.  Patient is critically ill due to hypoxic respiratory failure requiring mechanical and sedation.  He also has ventilator multivessel CAD status post PCI with ischemic CM and severe MR.  Now complicated by gram-positive cocci/MSSA bacteremia/pneumonia with sepsis.  Also requiring CRRT for ESRD.  Critical care time: 25 minutes with patient / family , 30 - 51 min with chart review & decision making / consultatio with PCCM. Total 55-60 min   For questions or updates, please contact [place] Please consult  [email or web address] for contact info under        Signed, Glenetta Hew, MD  11/02/2020, 10:04 AM

## 2020-11-02 NOTE — Progress Notes (Signed)
Progress Note  Patient Name: Travis [name] Date of Encounter: 11/02/2020  [facility] Cardiologist: Glenetta Hew, MD   Note being rewritten on 11/25 because original note was not saved.  Patient Profile     59 y.o. male with ESRD on IHD, Ascending AoTAA - admitted with  Anterior STEMI 11/21 ->  PCI of the LAD. He has residual disease in his right coronary and AVG-Cx for planned PCI.  PTA was not feeing well for several days - chills & nausea.  On Cath - LVEDP 30-35 mmHg -> had been drinking lots of H20 & in settting of ACS - acute DHF --> developed pulmonary Edema = Acute HD.   Was then febrile PM 11/20-21 - > now 2/2 BC + Staph a (1 reported MSSA).  Overnight 11/21-22 -> went into Afib RVR - became hypotensive -after initial restoration of sinus rhythm, went back into A. fib RVR Now in Septic Shock. ->  Chest x-ray and CT of the chest on 11/23 confirm right lower lobe staph pneumonia -> DCCV on 10/31/2020-> followed by intubation by late afternoon.   Principal Problem:   Acute ST elevation myocardial infarction (STEMI) of anterolateral wall (HCC) Active Problems:   ESRD (end stage renal disease) ([place])   3 V- CAD w/ ACS/STEMI: Culprit = 99% mLAD @ D1 (DES PCI jailing D1); 99% ost-AVGLCx, 80% calcified napkin ring prox RCA.    Presence of drug coated stent in LAD coronary artery: Resolute Onyx DES 2.75 mm x 18 mm (3.1 mm) at major D1 & SP1.   Acute combined systolic and diastolic heart failure (HCC)   Atrial fibrillation with RVR (HCC)   MSSA bacteremia   Septic shock due to Staphylococcus aureus (HCC)   Hyperlipidemia with target LDL less than 70   Essential hypertension   Dyspnea   Cardiac arrest ([place])   Subjective   Overnight events included intubation yesterday afternoon for worsening agitation and respiratory distress.  Patient was following cardioversion yesterday afternoon.  He is maintaining sinus rhythm.   Had significant agitation overnight last night  requiring increasing levels of sedation that led to PEA arrest.  Relatively rapid ROSC.  Now is resting more comfortably.  Plans for nephrology are to correct her CRRT  Inpatient Medications    Scheduled Meds: . sodium chloride   Intravenous Once  . aspirin  150 mg Rectal Daily  . atorvastatin  80 mg Oral q1800  . chlorhexidine gluconate (MEDLINE KIT)  15 mL Mouth Rinse BID  . Chlorhexidine Gluconate Cloth  6 each Topical Q0600  . docusate  100 mg Per Tube BID  . doxercalciferol  5 mcg Intravenous Q M,W,F-HD  . ferric citrate  210 mg Oral With snacks  . ferric citrate  630 mg Oral TID with meals  . lidocaine  1 patch Transdermal Q24H  . mouth rinse  15 mL Mouth Rinse 10 times per day  . mupirocin ointment  1 application Nasal BID  . polyethylene glycol  17 g Per Tube Daily  . sodium chloride flush  10-40 mL Intracatheter Q12H  . sodium chloride flush  3 mL Intravenous Q12H  . Thrombi-Pad  1 each Topical Once   Continuous Infusions: .  prismasol BGK 4/2.5 400 mL/hr at 11/01/20 1500  .  prismasol BGK 4/2.5 200 mL/hr at 11/01/20 1500  . sodium chloride Stopped (11/02/20 0811)  . sodium chloride Stopped (10/28/20 0954)  . sodium chloride 10 mL/hr at 11/02/20 0900  . amiodarone 30 mg/hr (11/02/20  0900)  . cangrelor 50 mg in NS 250 mL 0.75 mcg/kg/min (11/02/20 0900)  .  ceFAZolin (ANCEF) IV    . dexmedetomidine (PRECEDEX) IV infusion Stopped (11/01/20 0317)  . famotidine (PEPCID) IV 10 mg (11/02/20 0904)  . heparin 1,400 Units/hr (11/02/20 0900)  . HYDROmorphone 1.5 mg/hr (11/02/20 0900)  . nitroGLYCERIN Stopped (10/28/20 2100)  . norepinephrine (LEVOPHED) Adult infusion 36 mcg/min (11/02/20 0900)  . phenylephrine (NEO-SYNEPHRINE) Adult infusion Stopped (10/29/20 2237)  . prismasol BGK 2/2.5 dialysis solution 2,000 mL/hr at 11/02/20 0647  . propofol (DIPRIVAN) infusion 45 mcg/kg/min (11/02/20 0905)  . vasopressin 0.03 Units/min (11/02/20 0900)   PRN Meds: sodium chloride,  acetaminophen, heparin, HYDROmorphone, HYDROmorphone (DILAUDID) injection, HYDROmorphone (DILAUDID) injection, midazolam, nitroGLYCERIN, ondansetron (ZOFRAN) IV, sodium chloride, sodium chloride flush, sodium chloride flush   Vital Signs    Vitals:   11/02/20 0830 11/02/20 0845 11/02/20 0848 11/02/20 0900  BP:   (!) 93/49   Pulse: 74 74  73  Resp: 15 12  15   Temp:      TempSrc:      SpO2: 100% 100%  100%  Weight:      Height:        Intake/Output Summary (Last 24 hours) at 11/02/2020 0911 Last data filed at 11/02/2020 0900 Gross per 24 hour  Intake 3181.85 ml  Output 2276 ml  Net 905.85 ml   Last 3 Weights 11/02/2020 10/29/2020 10/28/2020  Weight (lbs) 179 lb 7.3 oz 182 lb 8.7 oz 190 lb 7.6 oz  Weight (kg) 81.4 kg 82.8 kg 86.4 kg      Telemetry    Sinus rhythm in 70s personally Reviewed  ECG    Unusual P axis, possible ectopic atrial rhythm versus sinus rhythm with rate 84 bpm.  Anterolateral MI, recent. Low voltage QRS. - Personally Reviewed  Physical Exam   GEN:  Intubated and sedated Neck: + JVD.  Still clinically worse. Cardiac:  RRR, 2/6 HSM at apex Respiratory: Coarse upper respiratory sounds from ET tube, but more prominent rhonchorous breath sounds in the right base. GI:  Soft/NT/ND NABS.  No HSM MS:  No C/C/E.  Left femoral venous line placed, right femoral access site with no hematoma.  No signs of it.  Hemorrhage. Neuro:   Intubated and sedated.  Labs    High Sensitivity Troponin:   Recent Labs  Lab 10/28/20 0629 10/28/20 1046 10/28/20 2040 10/29/20 2121 10/30/20 0548  TROPONINIHS 3,155* >27,000* >27,000* >27,000* >27,000*      Chemistry Lab 10/28/20 0604 10/29/20 0101 10/30/20 0548 10/31/20 0830 11/01/20 0430 11/01/20 0430 11/01/20 1514 11/01/20 2048  NA 133*   < > 125*   < > 137   < > 138 133*  K 3.7   < > 5.2*   < > 5.0   < > 5.4* 5.1  CL 86*   < > 86*   < > 95*   < > 92* 95*  CO2 31   < > 22   < > 23   < > 24 20*  GLUCOSE 140*    < > 148*   < > 169*   < > 88 103*  BUN 30*   < > 75*   < > 82*   < > 88* 75*  CREATININE 9.73*   < > 11.18*   < > 10.59*   < > 10.91* 8.15*  CALCIUM 9.8   < > 9.1   < > 8.7*   < > 9.0 8.9  PROT  6.6  --  5.6*  --   --   --   --   --   ALBUMIN 3.1*  --  2.2*   < > 1.6*  --  1.7*  --   AST 84*  --  87*  --   --   --   --   --   ALT 87*  --  51*  --   --   --   --   --   ALKPHOS 146*  --  95  --   --   --   --   --   BILITOT 1.2  --  1.5*  --   --   --   --   --   GFRNONAA 6*   < > 5*   < > 5*   < > 5* 7*  ANIONGAP 16*   < > 17*   < > 19*   < > 22* 18*     Hematology Lab 11/01/20 0430 11/01/20 0430 11/01/20 1454 11/01/20 2045  WBC 14.7*  --   --  28.1*  RBC 2.00*  --   --  2.33*  HGB 6.1*   < > 7.3* 7.6*  HCT 19.3*   < > 21.7* 21.6*  MCV 96.5  --   --  92.7  MCH 30.5  --   --  32.6  MCHC 31.6  --   --  35.2  RDW 15.9*  --   --  15.7*  PLT 234  --   --  291    BNPNo results for input(s): BNP, PROBNP in the last 168 hours.   DDimer No results for input(s): DDIMER in the last 168 hours.   Radiology    CT ABDOMEN PELVIS WO CONTRAST  Result Date: 11/01/2020 CLINICAL DATA:  Anemia, dropping hemoglobin, concern for retroperitoneal hematoma following cardiac catheterization EXAM: CT ABDOMEN AND PELVIS WITHOUT CONTRAST TECHNIQUE: Multidetector CT imaging of the abdomen and pelvis was performed following the standard protocol without IV contrast. COMPARISON:  None. FINDINGS: Lower chest: There are small bilateral pleural effusions and associated atelectasis or consolidation, with a significant amount of somewhat nodular heterogeneous airspace disease present in the right greater than left lower lobes. Cardiomegaly. Three-vessel coronary artery calcifications. Hepatobiliary: No solid liver abnormality is seen. Excreted contrast in the gallbladder. No gallstones, gallbladder wall thickening, or biliary dilatation. Pancreas: Unremarkable. No pancreatic ductal dilatation or surrounding  inflammatory changes. Spleen: Normal in size without significant abnormality. Adrenals/Urinary Tract: Adrenal glands are unremarkable. Status post right nephrectomy. The left kidney is atrophic. Bladder is unremarkable. Stomach/Bowel: Stomach is within normal limits. Appendix appears normal. No evidence of bowel wall thickening, distention, or inflammatory changes. Colonic diverticulosis. Vascular/Lymphatic: Aortic atherosclerosis. Left femoral venous catheter. No enlarged abdominal or pelvic lymph nodes. Reproductive: No mass or other significant abnormality. Other: Mild anasarca.  No abdominopelvic ascites. Musculoskeletal: No acute or significant osseous findings. Renal osteodystrophy. IMPRESSION: 1. No evidence of retroperitoneal hematoma or other nidus of hemorrhage. 2. There are small bilateral pleural effusions and associated atelectasis or consolidation, with a significant amount of somewhat nodular heterogeneous airspace disease present in the right greater than left lower lobes. Findings are concerning for infection or aspiration 3. Cardiomegaly and coronary artery disease. 4. Status post right nephrectomy. Aortic Atherosclerosis (ICD10-I70.0). Electronically Signed   By: Eddie Candle M.D.   On: 11/01/2020 10:58   DG Abd 1 View  Result Date: 11/01/2020 CLINICAL DATA:  Orogastric tube placement EXAM: ABDOMEN - 1  VIEW COMPARISON:  Multiple prior abdominal studies and chest x-ray of the same date. FINDINGS: Gastric tube tip in the distal esophagus. Similar to the prior exam. Consolidative changes at the RIGHT lung base likely associated with small effusion also similar to the previous study. Vascular catheter with tip at the caval to atrial junction. Patchy opacities at the LEFT lung base. Mild gaseous distension of upper abdominal bowel loops, incomplete assessment. Limited assessment of skeletal structures without acute process. IMPRESSION: 1. Gastric tube tip remains in the distal esophagus. 2.  Airspace disease at the lung bases worse on the RIGHT as before. 3. Mild gaseous distension of upper abdominal bowel loops, incomplete assessment. Electronically Signed   By: Zetta Bills M.D.   On: 11/01/2020 15:56   DG Abd 1 View  Result Date: 11/01/2020 CLINICAL DATA:  Check gastric catheter placement EXAM: ABDOMEN - 1 VIEW COMPARISON:  Film from earlier in the same day. FINDINGS: Gastric catheter is noted in the distal esophagus with the tip just above the gastroesophageal junction. This should be advanced several cm further into the stomach. IMPRESSION: Gastric catheter in the distal esophagus. This should be advanced several cm deeper into the stomach Electronically Signed   By: Inez Catalina M.D.   On: 11/01/2020 12:59   DG Abd 1 View  Result Date: 11/01/2020 CLINICAL DATA:  Orogastric tube positioning EXAM: ABDOMEN - 1 VIEW COMPARISON:  October 31, 2020 FINDINGS: Orogastric tube tip is in the distal esophagus. There is mild dilatation of the stomach with air. No other bowel dilatation. No free air. Airspace opacity in the lung bases, more severe on the right than on the left. IMPRESSION: Orogastric tube tip in distal esophagus. Advise advancing orogastric tube approximately 12 cm. No bowel obstruction or free air.  Moderate air in stomach. Infiltrate in the lung bases, more severe on the right than on the left. These results will be called to the ordering clinician or representative by the Radiologist Assistant, and communication documented in the PACS or [employer]. Electronically Signed   By: Lowella Grip III M.D.   On: 11/01/2020 12:21   DG Abd 1 View  Result Date: 10/31/2020 CLINICAL DATA:  Nasogastric tube placement. EXAM: ABDOMEN - 1 VIEW COMPARISON:  October 31, 2020 (8:47 p.m.) FINDINGS: A nasogastric tube is seen with its distal tip overlying the expected region of the gastroesophageal junction. Stable gastric distension is noted. The bowel gas pattern is otherwise  normal. No radio-opaque calculi or other significant radiographic abnormality are seen. IMPRESSION: 1. Stable nasogastric tube positioning, as described above. Electronically Signed   By: Virgina Norfolk M.D.   On: 10/31/2020 21:53   DG Abd 1 View  Result Date: 10/31/2020 CLINICAL DATA:  59 year old male with NG placement. EXAM: ABDOMEN - 1 VIEW COMPARISON:  None. FINDINGS: Partially visualized enteric tube with side-port in the distal esophagus and tip in the region of the GE junction. Recommend further advancing of the tube by an additional 17 cm. There is gaseous distension of the stomach. IMPRESSION: Enteric tube with side-port in the distal esophagus and tip in the region of the GE junction. Recommend further advancing of the tube by an additional 17 cm. Electronically Signed   By: Anner Crete M.D.   On: 10/31/2020 20:58   CT CHEST WO CONTRAST  Result Date: 10/31/2020 CLINICAL DATA:  Chest pain and increased shortness of breath. EXAM: CT CHEST WITHOUT CONTRAST TECHNIQUE: Multidetector CT imaging of the chest was performed following the standard protocol without  IV contrast. COMPARISON:  None. FINDINGS: Cardiovascular: The heart size is mildly increased. No substantial pericardial effusion. Coronary artery calcification is evident. Atherosclerotic calcification is noted in the wall of the thoracic aorta. Mediastinum/Nodes: Scattered upper normal mediastinal lymph nodes evident. No evidence for gross hilar lymphadenopathy although assessment is limited by the lack of intravenous contrast on today's study. The esophagus has normal imaging features. There is no axillary lymphadenopathy. Lungs/Pleura: Extensive multifocal nodular and patchy airspace disease is seen in both lungs. Upper lung disease has a slight peripheral predominance and a 3.1 cm nodular consolidative opacity in the right upper lobe is cavitated (36/4). Confluent airspace disease is seen in the posterior right middle lobe with air  bronchograms and relatively diffuse involvement noted right lower lobe as well. No pleural effusion. Upper Abdomen: 17 mm left adrenal nodule cannot be characterized. Liver parenchyma is heterogeneous Musculoskeletal: No worrisome lytic or sclerotic osseous abnormality. Bony mineralization suggests chronic renal disease. IMPRESSION: 1. Extensive multifocal nodular and patchy airspace disease in both lungs with a slight peripheral predominance in the upper lungs. 3.1 cm nodular consolidative opacity in the right upper lobe is cavitated. Imaging features likely related to multifocal pneumonia. Septic emboli and metastatic disease considered less likely but not excluded. 2. 17 mm left adrenal nodule cannot be characterized. MRI abdomen with and without contrast could be used to further evaluate as clinically warranted. 3. Aortic Atherosclerosis (ICD10-I70.0). Electronically Signed   By: Misty Stanley M.D.   On: 10/31/2020 11:32   DG Chest Port 1 View  Result Date: 11/01/2020 CLINICAL DATA:  59 year old male enteric tube placement. EXAM: PORTABLE CHEST 1 VIEW COMPARISON:  Portable chest 1209 hours today and earlier. FINDINGS: Portable AP semi upright view at 1536 hours. Enteric tube courses through the mediastinum into the left abdomen, tip not included. Right IJ approach dual lumen catheter and endotracheal tube appear stable. Stable cardiac size and mediastinal contours. Stable ventilation. No pneumothorax. Confluent right lung base opacity. IMPRESSION: 1. Enteric tube now extends into the left abdomen, tip not included. 2. Otherwise stable lines and tubes. 3. Stable ventilation with right lung base consolidation. Electronically Signed   By: Genevie Ann M.D.   On: 11/01/2020 15:57   DG CHEST PORT 1 VIEW  Result Date: 11/01/2020 CLINICAL DATA:  Central catheter placement EXAM: PORTABLE CHEST 1 VIEW COMPARISON:  November 01, 2020 study obtained earlier in the day FINDINGS: Central catheter tip is in the superior  vena cava. Endotracheal tube tip is 3.4 cm above the carina. Nasogastric tube tip is not seen below the level of the distal esophagus. No pneumothorax. There is patchy airspace opacity bilaterally, somewhat more severe on the right than on the left, stable. No new opacity evident. Heart upper normal in size with pulmonary vascularity normal. No adenopathy. No bone lesions. IMPRESSION: Tube and catheter positions as described without pneumothorax. Note that nasogastric tube tip is not seen beyond distal esophagus. Multifocal airspace opacity consistent with pneumonia, more severe on the right than on the left. No new opacity appreciable compared to earlier in the day. Stable cardiac prominence. Electronically Signed   By: Lowella Grip III M.D.   On: 11/01/2020 12:20   DG Chest Port 1 View  Result Date: 11/01/2020 CLINICAL DATA:  Cardiac arrest. EXAM: PORTABLE CHEST 1 VIEW COMPARISON:  One-view chest x-ray 10/31/2020 FINDINGS: Endotracheal tube is stable and in satisfactory position. Enteric tube terminates in the distal esophagus. Proximal aspect of the tube is coiled in the upper esophagus, above the  thoracic inlet. Defibrillator pads are in place. Heart is enlarged. Interstitial airspace opacities are again noted bilaterally, right greater than left. Aeration is improved. IMPRESSION: 1. Stable and satisfactory positioning of the endotracheal tube. 2. Stable cardiomegaly. 3. Improved aeration bilaterally. 4. Enteric tube terminates in the distal esophagus. Proximal aspect of tube is coiled in the cervical esophagus Electronically Signed   By: San Morelle M.D.   On: 11/01/2020 04:05   DG CHEST PORT 1 VIEW  Result Date: 10/31/2020 CLINICAL DATA:  Orogastric tube placement EXAM: PORTABLE CHEST 1 VIEW COMPARISON:  Earlier today FINDINGS: Endotracheal tube with tip between the clavicular heads and carina. The orogastric tube loops through the esophagus both proximally and distally. The tip does  not reach the stomach, which is gas distended since prior. Extensive infiltrate on the right more than left with possible volume loss at the right base where there is progressively dense opacity. Generous heart size. No visible pneumothorax. These results will be called to the ordering clinician or representative by the Radiologist Assistant, and communication documented in the PACS or [employer]. IMPRESSION: 1. Malpositioned orogastric tube which loops in the esophagus. The stomach is gas dilated. 2. Endotracheal tube in unremarkable position. 3. Multifocal airspace disease with progressively dense opacity at the right base. Electronically Signed   By: Monte Fantasia M.D.   On: 10/31/2020 20:18   DG CHEST PORT 1 VIEW  Result Date: 10/31/2020 CLINICAL DATA:  Right-sided chest pain and shortness of breath EXAM: PORTABLE CHEST 1 VIEW COMPARISON:  10/28/2020 FINDINGS: Cardiac shadow is enlarged but accentuated by the portable technique. New patchy infiltrate in the right mid and lower lung is noted consistent with acute pneumonia. This was not seen on the prior exam. No acute rib abnormality is noted. IMPRESSION: New patchy right mid and lower lung infiltrate. Electronically Signed   By: Inez Catalina M.D.   On: 10/31/2020 09:41   ECHOCARDIOGRAM COMPLETE  Result Date: 10/31/2020    ECHOCARDIOGRAM REPORT   Patient Name:   Travis [name] Date of Exam: 10/31/2020 Medical Rec #:  [id number]         Height:       71.0 in Accession #:    [id number]        Weight:       182.5 lb Date of Birth:  02-Nov-1961        BSA:          2.028 m Patient Age:    76 years          BP:           112/74 mmHg Patient Gender: M                 HR:           98 bpm. Exam Location:  Inpatient Procedure: 2D Echo, 3D Echo, Strain Analysis, Color Doppler and Cardiac Doppler Indications:    Acute myocardial infarction 410  History:        Patient has prior history of Echocardiogram examinations, most                 recent  11/13/2012. CHF, CAD and Acute MI, Arrythmias:Atrial                 Fibrillation; Risk Factors:Hypertension and Dyslipidemia.                 Chronic kidney disease. Ascending AoTAA. Septic shock due to  Staphylococcus aureus.  Sonographer:    [name] RDCS Referring Phys: [id number] [facility]  Sonographer Comments: Global longitudinal strain was attempted. Cardioversion performed before echocardiogram IMPRESSIONS  1. Left ventricular ejection fraction, by estimation, is 35 to 40%. The left ventricle has moderately decreased function. The left ventricle demonstrates regional wall motion abnormalities (see scoring diagram/findings for description). There is moderate concentric left ventricular hypertrophy. Left ventricular diastolic parameters are consistent with Grade III diastolic dysfunction (restrictive). Elevated left atrial pressure. There is severe hypokinesis of the left ventricular, mid-apical inferolateral wall and inferior wall. There is moderate hypokinesis of the left ventricular, apical septal wall, anterior wall and apical segment.  2. Right ventricular systolic function is normal. The right ventricular size is normal. There is mildly elevated pulmonary artery systolic pressure. The estimated right ventricular systolic pressure is 93.7 mmHg.  3. Left atrial size was severely dilated.  4. Right atrial size was moderately dilated.  5. By PISA, effective regurgitant orifice area is 0.4 cm, the regurgitant volume is 55 ml, regurgitant fraction 40%. The mitral valve is normal in structure. Moderate to severe mitral valve regurgitation.  6. Tricuspid valve regurgitation is mild to moderate.  7. The aortic valve is tricuspid. There is mild calcification of the aortic valve. There is moderate thickening of the aortic valve. Aortic valve regurgitation is mild. Mild to moderate aortic valve sclerosis/calcification is present, without any evidence of aortic stenosis.  8. Aortic dilatation  noted. There is mild dilatation of the ascending aorta, measuring 41 mm.  9. The inferior vena cava is dilated in size with <50% respiratory variability, suggesting right atrial pressure of 15 mmHg. Conclusion(s)/Recommendation(s): No evidence of valvular vegetations on this transthoracic echocardiogram. Would recommend a transesophageal echocardiogram to exclude infective endocarditis if clinically indicated. FINDINGS  Left Ventricle: Left ventricular ejection fraction, by estimation, is 35 to 40%. The left ventricle has moderately decreased function. The left ventricle demonstrates regional wall motion abnormalities. Severe hypokinesis of the left ventricular, mid-apical inferolateral wall and inferior wall. Moderate hypokinesis of the left ventricular, apical septal wall, anterior wall and apical segment. The left ventricular internal cavity size was normal in size. There is moderate concentric left ventricular hypertrophy. Left ventricular diastolic parameters are consistent with Grade III diastolic dysfunction (restrictive). Elevated left atrial pressure. Right Ventricle: The right ventricular size is normal. No increase in right ventricular wall thickness. Right ventricular systolic function is normal. There is mildly elevated pulmonary artery systolic pressure. The tricuspid regurgitant velocity is 2.66  m/s, and with an assumed right atrial pressure of 15 mmHg, the estimated right ventricular systolic pressure is 34.2 mmHg. Left Atrium: Left atrial size was severely dilated. Right Atrium: Right atrial size was moderately dilated. Pericardium: There is no evidence of pericardial effusion. Mitral Valve: By PISA, effective regurgitant orifice area is 0.4 cm, the regurgitant volume is 55 ml, regurgitant fraction 40%. The mitral valve is normal in structure. There is mild thickening of the mitral valve leaflet(s). Mild to moderate mitral annular calcification. Moderate to severe mitral valve regurgitation, with  eccentric posteriorly directed jet. Tricuspid Valve: The tricuspid valve is normal in structure. Tricuspid valve regurgitation is mild to moderate. Aortic Valve: The aortic valve is tricuspid. There is mild calcification of the aortic valve. There is moderate thickening of the aortic valve. Aortic valve regurgitation is mild. Mild to moderate aortic valve sclerosis/calcification is present, without any evidence of aortic stenosis. Pulmonic Valve: The pulmonic valve was grossly normal. Pulmonic valve regurgitation is not visualized. Aorta: Aortic  dilatation noted. There is mild dilatation of the ascending aorta, measuring 41 mm. Venous: The inferior vena cava is dilated in size with less than 50% respiratory variability, suggesting right atrial pressure of 15 mmHg. IAS/Shunts: No atrial level shunt detected by color flow Doppler. Additional Comments: A is visualized in the right ventricle.  LEFT VENTRICLE PLAX 2D LVIDd:         5.30 cm LVIDs:         3.70 cm LV PW:         1.40 cm LV IVS:        1.80 cm LVOT diam:     2.20 cm LV SV:         79 LV SV Index:   39 LVOT Area:     3.80 cm  LV Volumes (MOD) LV vol d, MOD A2C: 253.0 ml LV vol d, MOD A4C: 227.0 ml LV vol s, MOD A2C: 148.0 ml LV vol s, MOD A4C: 143.0 ml LV SV MOD A2C:     105.0 ml LV SV MOD A4C:     227.0 ml LV SV MOD BP:      98.1 ml RIGHT VENTRICLE RV S prime:     12.00 cm/s TAPSE (M-mode): 1.5 cm LEFT ATRIUM              Index       RIGHT ATRIUM           Index LA diam:        4.90 cm  2.42 cm/m  RA Area:     20.40 cm LA Vol (A2C):   109.0 ml 53.74 ml/m RA Volume:   59.30 ml  29.24 ml/m LA Vol (A4C):   116.0 ml 57.19 ml/m LA Biplane Vol: 114.0 ml 56.21 ml/m  AORTIC VALVE LVOT Vmax:   121.00 cm/s LVOT Vmean:  95.100 cm/s LVOT VTI:    0.207 m  AORTA Ao Root diam: 3.20 cm Ao Asc diam:  4.10 cm MITRAL VALVE                 TRICUSPID VALVE MV Area (PHT): 6.96 cm      TR Peak grad:   28.3 mmHg MV Decel Time: 109 msec      TR Vmax:        266.00 cm/s MR  Peak grad:    75.9 mmHg MR Mean grad:    50.0 mmHg   SHUNTS MR Vmax:         435.50 cm/s Systemic VTI:  0.21 m MR Vmean:        333.0 cm/s  Systemic Diam: 2.20 cm MR PISA:         6.28 cm MR PISA Eff ROA: 51 mm MR PISA Radius:  1.00 cm MV E velocity: 145.33 cm/s Sanda Klein MD Electronically signed by Sanda Klein MD Signature Date/Time: 10/31/2020/2:50:12 PM    Final    ECHO TEE  Result Date: 11/01/2020    TRANSESOPHOGEAL ECHO REPORT   Patient Name:   Travis [name] Date of Exam: 11/01/2020 Medical Rec #:  [id number]         Height:       71.0 in Accession #:    [id number]        Weight:       182.5 lb Date of Birth:  [birth year]        BSA:          2.028 m Patient Age:    41  years          BP:           113/73 mmHg Patient Gender: M                 HR:           77 bpm. Exam Location:  Inpatient Procedure: Transesophageal Echo Indications:    endocarditis  History:        Patient has prior history of Echocardiogram examinations, most                 recent 11/01/2020. CAD; Risk Factors:Hypertension, Dyslipidemia                 and Former Smoker. STEMI.  Sonographer:    Jannett Celestine RDCS (AE) Referring Phys: [id number] [facility] A CHANDRASEKHAR PROCEDURE: The transesophogeal probe was passed without difficulty through the esophogus of the patient. Sedation performed by performing physician. The patient developed no complications during the procedure. IMPRESSIONS  1. Left ventricular ejection fraction, by estimation, is 35 to 40%. The left ventricle has moderately decreased function.  2. Right ventricular systolic function is normal. The right ventricular size is normal.  3. Left atrial size was severely dilated. No left atrial/left atrial appendage thrombus was detected.  4. Right atrial size was moderately dilated.  5. The mitral valve is grossly normal. Severe mitral valve regurgitation.  6. The aortic valve is tricuspid. There is moderate calcification of the aortic valve. Aortic valve regurgitation  is moderate.  7. There is mild dilatation of the ascending aorta, measuring 40 mm. There is Moderate (Grade III) plaque.  8. The left upper pulmonary vein, left lower pulmonary vein and right upper pulmonary vein are abnormal. Conclusion(s)/Recommendation(s): There is evidence of severe mitral regurgitation; predominantely atrial functional mitral regurgitation with slight posterior tethering. There is associated moderate reduction in LV function. There is aortic regurgitation  with echodensities that could be consistent with calcification in patient with ESRD. 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 Discussed with patient and primary MD. FINDINGS  Left Ventricle: Left ventricular ejection fraction, by estimation, is 35 to 40%. The left ventricle has moderately decreased function. The left ventricular internal cavity size was normal in size. Right Ventricle: The right ventricular size is normal. Right vetricular wall thickness was not assessed. Right ventricular systolic function is normal. Left Atrium: Left atrial size was severely dilated. No left atrial/left atrial appendage thrombus was detected. Right Atrium: Right atrial size was moderately dilated. Pericardium: The pericardium was not well visualized. Mitral Valve: There is severe eccentric mitral regurgitation. The mitral valve is grossly normal. Severe mitral valve regurgitation. Tricuspid Valve: The tricuspid valve is grossly normal. Tricuspid valve regurgitation is mild. Aortic Valve: Moderate echodensity on the aortic valve on the side of the ascending aorta, favoring calification; thought cannot exclude. The aortic valve is tricuspid. There is moderate calcification of the aortic valve. Aortic valve regurgitation is moderate. Aortic regurgitation PHT measures 393 msec. Pulmonic Valve: The pulmonic valve was grossly normal. Pulmonic valve regurgitation is not visualized. Aorta: The aortic root is normal in size and structure.  There is mild dilatation of the ascending aorta, measuring 40 mm. There is moderate (Grade III) plaque. Venous: The left upper pulmonary vein, left lower pulmonary vein and right upper pulmonary vein are abnormal. IAS/Shunts: No atrial level shunt detected by color flow Doppler.  AORTIC VALVE AI PHT:      393 msec Rudean Haskell MD Electronically signed by Lyda Kalata  Chandrasekhar MD Signature Date/Time: 11/01/2020/5:10:24 PM    Final     Cardiac Studies    Cath PCI 11/20: SUMMARY  Acute anterolateral ST elevation MI with Culprit lesion being the 99% mid LAD lesion (just past major 1st Diag/D1) along with multivessel CAD including 99% ostial AV groove LCx and 80% calcified napkin ring proximal RCA lesion with extensive calcification throughout the RCA. ? Successful PTCA and DES PCI of the LAD crossing D1 -resolute Onyx DES 2.75 mm x 18 mm postdilated to 3.1 mm. ? With PCI, there was stabilization of significant ectopy, AIVR and PVCs  Mild to moderate reduced EF with anterior anterolateral hypokinesis, EF roughly 40 to 45%.  Initial evaluation suggested normal EDP, but on recheck post PCI LVEDP of 30 mmHg, severely elevated consistent with ACUTE DIASTOLIC HEART FAILURE  Significantly dilated aortic root requiring AL-1 guide catheter for both Left and Right Coronary Angiography.   RECOMMENDATIONS  Admit to CCU, restart IV heparin 8 hours after sheath removal  Check 2D echo for better assessment of EF  Plan to review cath films with other IC cardiologist, anticipate staged PCI of at least the RCA which may require atherectomy versus lithotripsy, and likely the ostial AV groove circumflex.  If he has signs of dyspnea, consider earlier dialysis than tomorrow. ->  Would consult for nephrology to see today.  He is already on labetalol and Norvasc.  We will continue current home medications.  Uninterrupted DAPT times X 1 year.  LFemV Central line placed by [facility] 11/21    TTE  (10/31/2020): EF 35-40%. Moderate LVH. GR 3 DD. Severe HK of mid apical inferolateral and inferior wall with moderate HK of apical septal anterior wall. PAP estimated 43 mmHg. Severe LA dilation. Moderate RA dilation. Moderate-severe MR.    TEE being performed (11/01/2020), report not yet done, bedside evaluation suggests severe LA dilation with severe MR.  EF about 35 to 40%.  No obvious vegetation.  Prior to TEE, patient had dialysis catheter (right IJ), and radial art line placed.  Assessment & Plan    Principal Problem:   Acute ST elevation myocardial infarction (STEMI) of anterolateral wall (HCC) Active Problems:   ESRD (end stage renal disease) ([place])   3 V- CAD w/ ACS/STEMI: Culprit = 99% mLAD @ D1 (DES PCI jailing D1); 99% ost-AVGLCx, 80% calcified napkin ring prox RCA.    Presence of drug coated stent in LAD coronary artery: Resolute Onyx DES 2.75 mm x 18 mm (3.1 mm) at major D1 & SP1.   Acute combined systolic and diastolic heart failure (HCC)   Atrial fibrillation with RVR (HCC)   MSSA bacteremia   Septic shock due to Staphylococcus aureus (HCC)   Hyperlipidemia with target LDL less than 70   Essential hypertension   Dyspnea   Cardiac arrest ([place])  Principal Problem:   Acute ST elevation myocardial infarction (STEMI) of anterolateral wall (HCC) /  3 V- CAD w/ ACS/STEMI: Culprit = 99% mLAD @ D1 (DES PCI jailing D1); 99% ost-AVGLCx, 80% calcified napkin ring prox RCA. /  Presence of drug coated stent in LAD coronary artery: Resolute Onyx DES 2.75 mm x 18 mm (3.1 mm) at major D1 & SP1.  PCI of LAD- with planned staged PCI of RCA & AVG Cx-  Will have to put on hold given MSSA bacteremia  On IV heparin drip for CAD & Afib (monitoring for bleeding) (being held for placement of dialysis catheter today)  High-dose statin  Home BP meds include  beta-blocker calcium blocker on hold for septic Shock  Especially on DAPT, however unable to take p.o. If not able to place NG tube/OG  tube, may need to convert to PR aspirin and IV ticagrelor.    End stage renal disease on dialysis [facility]) - on HD M-W-F (required urgent cath on 11/20 PM 2/2 volume overload & Acute DHF- 4 L off).  Appreciate Nephrology prompt attention this weekend & continued assistance  Plan is to start to see CRRT today.    Acute combined systolic and diastolic heart failure (HCC) (complicated with Volume overload & accelerated HTN)/Severe Mitral Vegetation--> resolved with HD on Saturday post Cath.   Remains on pressors.  Volume control with HD   We hope that his cardiac output improves with restoration of sinus rhythm. -> CoOx being checked by PCCM.  Once infection cleared, may need to reassess mitral vegetation and consider the possibility of mitral clip    Atrial fibrillation with RVR ([place]) - initially had urgent DCCV; initially back to SR shortly & retured to Afib RVR currently in 110-120s ->   Successfully cardioverted yesterday prior to intubation.   Plan was to convert to oral amiodarone, but unable to do so because of inability to get down OG tube.  We'll continue IV heparin until able to take p.o. Once no further procedures agreement performed. We will begin warfarin and stop aspirin. Would also convert from Brilinta to Plavix.    Bacteremia due to Staphylococcus - MSSA; Septic shock due to Staphylococcus aureus (HCC)/staph aureus pneumonia- PCCM consult & ID counsulted -> WBC elevated Febrile to 102.5 on 11/21 PM - current T max 99.8   2/2 BC + for MSSA = started on Vanc-Zosyn -remains on cefazolin.  Now with staph pneumonia, will have at least 4 weeks of antibiotics.  Now that he is intubated, is on slightly higher doses of Levophed and remains on vasopressin.  Plan TEE this afternoon > should be easier to do that he is intubated.  Will defer management of Abx to PCCM/ID.  Chest x-ray now shows clear evidence of right lower lobe pneumonia.  Concerning for "metastatic staph  bacteremia"    Hyperlipidemia with target LDL less than 70:  High dose statin started.     Essential hypertension -on pressors    Anemia - Hgb 9.5 on admission, currently stable at 6.1.  Does not appear to be bleeding.; on ESA per Nephrology  Transfused 1 unit this morning.  Follow-up hemoglobin 7.5/7.3 indicating appropriate bump.  CT abdomen pelvis ordered for this morning did not show signs of bleeding.  Suspect he will need ESA possibly IV iron per nephrology.   Patient is very complex patient with multisystem failure-CAD, A. fib, CHF, sepsis with ESRD on HD and anemia. Critical care time: 35 minutes with patient, 25 minutes per chart. Total 1:0 hour.   For questions or updates, please contact [place] Please consult [email or web address] for contact info under        Signed, Glenetta Hew, MD  11/02/2020, 9:11 AM

## 2020-11-02 NOTE — Progress Notes (Addendum)
NAME:  Travis [name], MRN:  [id number], DOB:  [birth year], LOS: 5 ADMISSION DATE:  10/28/2020, CONSULTATION DATE:  10/29/20 REFERRING MD:  Cardiology - [name], CHIEF COMPLAINT:  Hypotension, gram positive cocci on culture, AMS  Brief History   Travis [name] is a 59 year old man with a history of HTN, ESRD on HD MWF, RUE AV fistula, hx of smoking, admitted 10/28/20 with fevers, myalgias for several days, acute chest pain.    History of present illness   On Admission, found to have STEMI, multivessel disease. Pulm edema with mild hypoxemia.  S/Post PCI to the LAD on 10/29/19. Planning for staged intervention to the left circumflex and possibly right coronary artery.  EF 51-70%, diastolic dysfunction, LVEDP 35. Underwent HD on 11/20 with 4 L volume removed.  Saturation improved after dialysis (100% on RA) Fevers noted 11/20. Blood cultures grew MSSA, noted this evening. .    This evening developed Afib with RVR 180s. Adenosine given, without improvement  Amiodarone infusion started per cardiology, 150mg  bolus, then drip.  Lopressor 5mg IV and 500ccNS given. Started on Phenylephrine, was on 218mcg on my arrival.   Cardioverted emergently at 120J for ongoing afib with hypotension (60/40) and AMS.  Converted to sinus.  Remained moderately hypotensive (MAP 60) on phenylephrine.   Patient was given one dose zosyn, switched to ancef once cultures grew back MSSA.   Cardiac stress tests annually, last done 11/16/19.    Past Medical History  HTN ESRD, HD MWF  Significant Hospital Events   Cardioversion 11/21 Cardiac cath stent to LAD 11/20  Consults:  PCCM  Nephrology  Procedures:  Central line 11/21  Arterial line 11/21 Repeat DCCV 11/23 HD line 11/24 Arterial line 11/24  Significant Diagnostic Tests:  11/23 Echo Pending 11/22 LUE Vas Upper Extremity Doppler>> Arteriovenous fistula-Aneurysmal dilatation noted. 11/24 CT abdomen - no retroperitoneal hematoma 11/24 transthoracic  echocardiogram-severe LV dysfunction EF 35% with restrictive diastolic function severe inferior wall motion abnormalities.  Moderate to severe mitral regurgitation. Micro Data:   11/21 Staph aureus > MSSA by BCID >>> 11/22 Cultures negative. Antimicrobials:  Zosyn 11/21 x 1 Ancef 11/21 ->  Interim history/subjective:   Increased vasopressor requirements overnight in response to increasing need for sedation.  Objective   Blood pressure (!) 93/49, pulse 72, temperature 98.4 F (36.9 C), resp. rate 15, height 5\' 11"  (1.803 m), weight 81.4 kg, SpO2 100 %.    Vent Mode: PRVC FiO2 (%):  [50 %] 50 % Set Rate:  [15 bmp] 15 bmp Vt Set:  [600 mL] 600 mL PEEP:  [10 cmH20] 10 cmH20 Plateau Pressure:  [15 cmH20-26 cmH20] 15 cmH20   Intake/Output Summary (Last 24 hours) at 11/02/2020 1235 Last data filed at 11/02/2020 1200 Gross per 24 hour  Intake 3596.7 ml  Output 2763 ml  Net 833.7 ml   Filed Weights   10/28/20 1400 10/29/20 1630 11/02/20 0500  Weight: 86.4 kg 82.8 kg 81.4 kg    Examination:  General: middle aged adult male, intubated and sedated.  HENT: ETT tube in place. Minimal secretions.  Lungs: no ventilator dyssynchrony. Normal vesicular breath sounds throughout. Cardiovascular: in sinus rhythm with warm extremities. Capillary refill 3s.  Abdomen: soft with no distention.  Extremities: no acute deformity or ROM limitation. No edema.  Neuro: sedated with. Will cough and move with stimulation.   Resolved Hospital Problem list     Assessment & Plan:   Critically ill due to severe sepsis with septic shock due to MSSA bacteremia  requiring titration of vasopressors. -Titrate norepinephrine and vasopressin to keep MAP greater than 65 -Much of the vasopressor requirement at this time may be related to sedation.  Critically ill due to acute hypoxic respiratory failure due to MSSA pneumonia with cavitation. -Continue full ventilatory support -Chest physiotherapy -Begin PEEP  wean -SBT in a.m.  Critically ill due to acute toxic metabolic encephalopathy with agitation requiring titration of IV sedatives. -Difficult to enteral access.  Will need core track tomorrow -Once core track in place will start enteral sedation as adjunct. -Wean fentanyl and propofol as tolerated.  MSSA bacteremia likely from HD access.  No evidence of fistula or abscess.  Or endocarditis on TEE -Complete 6 weeks of IV cefazolin  Afib now cardioverted to sinus rhythm on amiodarone following DCCV - Cont amiodarone  STEMI/CAD. On Heparin, brillinta, asa.   - Will eventually need a return to cath lab for staged PCI  Hyponatremia - nephrology following, last HD 11/22 - Trend BMET  ESRD:  -Start CRRT today   Best practice:  Diet: NPO May need fluoroscopically guided OGT Pain/Anxiety/Delirium protocol (if indicated): Propofol and hydromorphone infusions to RASS -3, have added lorazepam IV as needed to minimize propofol use. VAP protocol (if indicated): Bundle in place DVT prophylaxis: on heparin gtt GI prophylaxis: famotidine Glucose control: Phase 1 glycemic control Mobility: Bedrest Code Status: Full  Family Communication: Spoke with wife  at bedside 11/24 Disposition: ICU  Labs   CBC: Recent Labs  Lab 10/28/20 0604 10/29/20 0101 10/29/20 2121 10/29/20 2128 10/30/20 0259 10/30/20 0259 10/31/20 0030 10/31/20 1811 11/01/20 0336 11/01/20 0430 11/01/20 1454 11/01/20 2045 11/02/20 0301  WBC 12.7*   < > 12.9*   < > 20.9*  --  16.3*  --   --  14.7*  --  28.1* 27.9*  NEUTROABS 11.4*  --  11.8*  --   --   --   --   --   --   --   --   --   --   HGB 9.5*   < > 6.0*   < > 8.0*   < > 8.0*   < > 6.8* 6.1* 7.3* 7.6* 7.5*  HCT 29.5*   < > 18.8*   < > 24.5*   < > 25.1*   < > 20.0* 19.3* 21.7* 21.6* 22.0*  MCV 95.5   < > 93.5   < > 92.8  --  93.3  --   --  96.5  --  92.7 95.7  PLT 143*   < > 86*   < > 119*  --  128*  --   --  234  --  291 295   < > = values in this interval not  displayed.    Basic Metabolic Panel: Recent Labs  Lab 10/31/20 0830 10/31/20 1811 11/01/20 0336 11/01/20 0430 11/01/20 1514 11/01/20 2048 11/02/20 0301  NA 136   < > 134* 137 138 133* 136  K 5.1   < > 5.7* 5.0 5.4* 5.1 4.9  CL 93*  --   --  95* 92* 95* 98  CO2 26  --   --  23 24 20* 23  GLUCOSE 118*  --   --  169* 88 103* 99  BUN 52*  --   --  82* 88* 75* 60*  CREATININE 8.19*  --   --  10.59* 10.91* 8.15* 6.78*  CALCIUM 8.9  --   --  8.7* 9.0 8.9 9.1  MG  --   --   --  2.8*  --   --  2.8*  PHOS 4.9*  --   --  8.3* 7.1*  --  5.8*   < > = values in this interval not displayed.   GFR: Estimated Creatinine Clearance: 12.6 mL/min (A) (by C-G formula based on SCr of 6.78 mg/dL (H)). Recent Labs  Lab 10/30/20 0259 10/30/20 0820 10/31/20 0030 10/31/20 1327 11/01/20 0412 11/01/20 0430 11/01/20 0625 11/01/20 2045 11/02/20 0301  WBC   < >  --  16.3*  --   --  14.7*  --  28.1* 27.9*  LATICACIDVEN  --  2.1*  --  2.5* 5.5*  --  2.1*  --   --    < > = values in this interval not displayed.    Liver Function Tests: Recent Labs  Lab 10/28/20 0604 10/28/20 0604 10/30/20 0548 10/31/20 0830 11/01/20 0430 11/01/20 1514 11/02/20 0301  AST 84*  --  87*  --   --   --   --   ALT 87*  --  51*  --   --   --   --   ALKPHOS 146*  --  95  --   --   --   --   BILITOT 1.2  --  1.5*  --   --   --   --   PROT 6.6  --  5.6*  --   --   --   --   ALBUMIN 3.1*   < > 2.2* 1.9* 1.6* 1.7* 1.6*   < > = values in this interval not displayed.   No results for input(s): LIPASE, AMYLASE in the last 168 hours. No results for input(s): AMMONIA in the last 168 hours.  ABG    Component Value Date/Time   PHART 7.295 (L) 11/01/2020 0336   PCO2ART 39.9 11/01/2020 0336   PO2ART 71 (L) 11/01/2020 0336   HCO3 19.4 (L) 11/01/2020 0336   TCO2 21 (L) 11/01/2020 0336   ACIDBASEDEF 7.0 (H) 11/01/2020 0336   O2SAT 82.0 11/01/2020 1130     Coagulation Profile: Recent Labs  Lab 10/28/20 0629  10/30/20 0548  INR 1.3* 1.2    Cardiac Enzymes: No results for input(s): CKTOTAL, CKMB, CKMBINDEX, TROPONINI in the last 168 hours.  HbA1C: Hgb A1c MFr Bld  Date/Time Value Ref Range Status  10/28/2020 06:04 AM 4.3 (L) 4.8 - 5.6 % Final    Comment:    (NOTE) Pre diabetes:          5.7%-6.4%  Diabetes:              >6.4%  Glycemic control for   <7.0% adults with diabetes   01/30/2015 09:09 PM 5.1 4.8 - 5.6 % Final    Comment:    (NOTE)         Pre-diabetes: 5.7 - 6.4         Diabetes: >6.4         Glycemic control for adults with diabetes: <7.0     CBG: Recent Labs  Lab 10/28/20 1120 11/01/20 0330 11/01/20 0502  GLUCAP 111* 108* 106*    Critical care time: 45 minutes     Kipp Brood, MD [facility] ICU Physician [place]  Pager: [phone] Or Epic Secure Chat After hours: [phone].  11/02/2020, 12:35 PM

## 2020-11-02 NOTE — Progress Notes (Signed)
[address] for Heparin Indication: chest pain/ACS  Allergies  Allergen Reactions  . Ibuprofen Hives  . Lisinopril Swelling    PT states he is allergic to all prils; caused facial swelling  . Naproxen Hives and Other (See Comments)    Alleve causes patient to have hives    Patient Measurements: Height: 5\' 11"  (180.3 cm) Weight: 81.4 kg (179 lb 7.3 oz) IBW/kg (Calculated) : 75.3 Heparin Dosing Weight: 86.4 kg   Vital Signs: Temp: 98.4 F (36.9 C) (11/25 1146) Temp Source: Axillary (11/25 0800) BP: 93/49 (11/25 0848) Pulse Rate: 73 (11/25 1100)  Labs: Recent Labs    11/01/20 0430 11/01/20 0430 11/01/20 1454 11/01/20 1454 11/01/20 1514 11/01/20 2045 11/01/20 2048 11/01/20 2059 11/02/20 0301 11/02/20 0955  HGB 6.1*   < > 7.3*   < >  --  7.6*  --   --  7.5*  --   HCT 19.3*   < > 21.7*  --   --  21.6*  --   --  22.0*  --   PLT 234  --   --   --   --  291  --   --  295  --   HEPARINUNFRC 0.49  --   --   --   --   --   --  0.47  --  0.34  CREATININE 10.59*   < >  --   --  10.91*  --  8.15*  --  6.78*  --    < > = values in this interval not displayed.    Estimated Creatinine Clearance: 12.6 mL/min (A) (by C-G formula based on SCr of 6.78 mg/dL (H)).  Assessment: 71 yom presenting with STEMI. Known to have dilated ascending aorta, ESRD. Found to have 99% mid LAD with multivessel in LCx and RCA, now s/p DES to LAD. Heparin was started post sheath removal. Patient was found to have bacteremia and will not proceed with cath intervention at this time. Patient developed Afib during admission treated with amiodarone and successful cardioversion.   Heparin level therapeutic at 0.34 after decreasing drip rate to 1400 units/hr. RN reports minor bleeding from line but this has stabilized since placement of thrombi-pad. Hgb 7.5, plts 295 stable. Remains on CRRT. Noted patient is started on cangrelor while Brilinta is on hold due to enteral access  issue.  This combination may increase patient's risk for bleeding.  Goal of Therapy:  Heparin level 0.3-0.5 units/mL while on cangrelor Monitor platelets by anticoagulation protocol: Yes   Plan:  Continue heparin infusion at 1400 units/hr.  Monitor daily HL, CBC, s/sx bleeding  Richardine Service, PharmD, BCPS PGY2 Cardiology Pharmacy Resident Phone: [phone] 11/02/2020  12:09 PM  Please check [email or web address] for unit-specific pharmacy phone numbers.

## 2020-11-03 ENCOUNTER — Encounter (HOSPITAL_COMMUNITY)
Admission: EM | Disposition: A | Discharge status: Home Health | Payer: Self-pay | Source: Home / Self Care | Attending: Cardiology

## 2020-11-03 ENCOUNTER — Inpatient Hospital Stay (HOSPITAL_COMMUNITY): Payer: Medicare Other

## 2020-11-03 DIAGNOSIS — I5041 Acute combined systolic (congestive) and diastolic (congestive) heart failure: Secondary | ICD-10-CM | POA: Diagnosis not present

## 2020-11-03 DIAGNOSIS — I2102 ST elevation (STEMI) myocardial infarction involving left anterior descending coronary artery: Secondary | ICD-10-CM | POA: Diagnosis not present

## 2020-11-03 DIAGNOSIS — D72829 Elevated white blood cell count, unspecified: Secondary | ICD-10-CM

## 2020-11-03 DIAGNOSIS — I469 Cardiac arrest, cause unspecified: Secondary | ICD-10-CM | POA: Diagnosis not present

## 2020-11-03 DIAGNOSIS — I4891 Unspecified atrial fibrillation: Secondary | ICD-10-CM | POA: Diagnosis not present

## 2020-11-03 LAB — BASIC METABOLIC PANEL
Anion gap: 15 (ref 5–15)
BUN: 36 mg/dL — ABNORMAL HIGH (ref 6–20)
CO2: 23 mmol/L (ref 22–32)
Calcium: 9.4 mg/dL (ref 8.9–10.3)
Chloride: 96 mmol/L — ABNORMAL LOW (ref 98–111)
Creatinine, Ser: 4.2 mg/dL — ABNORMAL HIGH (ref 0.61–1.24)
GFR, Estimated: 16 mL/min — ABNORMAL LOW (ref >60)
Glucose, Bld: 116 mg/dL — ABNORMAL HIGH (ref 70–99)
Potassium: 5 mmol/L (ref 3.5–5.1)
Sodium: 134 mmol/L — ABNORMAL LOW (ref 135–145)

## 2020-11-03 LAB — CBC
HCT: 23.8 % — ABNORMAL LOW (ref 39.0–52.0)
HCT: 24 % — ABNORMAL LOW (ref 39.0–52.0)
Hemoglobin: 7.8 g/dL — ABNORMAL LOW (ref 13.0–17.0)
Hemoglobin: 8 g/dL — ABNORMAL LOW (ref 13.0–17.0)
MCH: 32 pg (ref 26.0–34.0)
MCH: 32.5 pg (ref 26.0–34.0)
MCHC: 32.8 g/dL (ref 30.0–36.0)
MCHC: 33.3 g/dL (ref 30.0–36.0)
MCV: 97.5 fL (ref 80.0–100.0)
MCV: 97.6 fL (ref 80.0–100.0)
Platelets: 339 10*3/uL (ref 150–400)
Platelets: 355 10*3/uL (ref 150–400)
RBC: 2.44 MIL/uL — ABNORMAL LOW (ref 4.22–5.81)
RBC: 2.46 MIL/uL — ABNORMAL LOW (ref 4.22–5.81)
RDW: 16.4 % — ABNORMAL HIGH (ref 11.5–15.5)
RDW: 16.5 % — ABNORMAL HIGH (ref 11.5–15.5)
WBC: 37.3 10*3/uL — ABNORMAL HIGH (ref 4.0–10.5)
WBC: 37.5 10*3/uL — ABNORMAL HIGH (ref 4.0–10.5)
nRBC: 1.8 % — ABNORMAL HIGH (ref 0.0–0.2)
nRBC: 2 % — ABNORMAL HIGH (ref 0.0–0.2)

## 2020-11-03 LAB — CULTURE, RESPIRATORY W GRAM STAIN

## 2020-11-03 LAB — RENAL FUNCTION PANEL
Albumin: 1.7 g/dL — ABNORMAL LOW (ref 3.5–5.0)
Albumin: 1.6 g/dL — ABNORMAL LOW (ref 3.5–5.0)
Anion gap: 15 (ref 5–15)
Anion gap: 14 (ref 5–15)
BUN: 34 mg/dL — ABNORMAL HIGH (ref 6–20)
BUN: 35 mg/dL — ABNORMAL HIGH (ref 6–20)
CO2: 23 mmol/L (ref 22–32)
CO2: 23 mmol/L (ref 22–32)
Calcium: 9.8 mg/dL (ref 8.9–10.3)
Calcium: 9.4 mg/dL (ref 8.9–10.3)
Chloride: 96 mmol/L — ABNORMAL LOW (ref 98–111)
Chloride: 98 mmol/L (ref 98–111)
Creatinine, Ser: 4.19 mg/dL — ABNORMAL HIGH (ref 0.61–1.24)
Creatinine, Ser: 4.06 mg/dL — ABNORMAL HIGH (ref 0.61–1.24)
GFR, Estimated: 16 mL/min — ABNORMAL LOW (ref >60)
GFR, Estimated: 16 mL/min — ABNORMAL LOW (ref >60)
Glucose, Bld: 121 mg/dL — ABNORMAL HIGH (ref 70–99)
Glucose, Bld: 126 mg/dL — ABNORMAL HIGH (ref 70–99)
Phosphorus: 6.3 mg/dL — ABNORMAL HIGH (ref 2.5–4.6)
Phosphorus: 6 mg/dL — ABNORMAL HIGH (ref 2.5–4.6)
Potassium: 5 mmol/L (ref 3.5–5.1)
Potassium: 4.4 mmol/L (ref 3.5–5.1)
Sodium: 134 mmol/L — ABNORMAL LOW (ref 135–145)
Sodium: 135 mmol/L (ref 135–145)

## 2020-11-03 LAB — GLUCOSE, CAPILLARY
Glucose-Capillary: 121 mg/dL — ABNORMAL HIGH (ref 70–99)
Glucose-Capillary: 129 mg/dL — ABNORMAL HIGH (ref 70–99)

## 2020-11-03 LAB — MAGNESIUM
Magnesium: 3 mg/dL — ABNORMAL HIGH (ref 1.7–2.4)
Magnesium: 3 mg/dL — ABNORMAL HIGH (ref 1.7–2.4)
Magnesium: 3 mg/dL — ABNORMAL HIGH (ref 1.7–2.4)

## 2020-11-03 LAB — PHOSPHORUS: Phosphorus: 6 mg/dL — ABNORMAL HIGH (ref 2.5–4.6)

## 2020-11-03 LAB — HEPARIN LEVEL (UNFRACTIONATED): Heparin Unfractionated: 0.45 IU/mL (ref 0.30–0.70)

## 2020-11-03 SURGERY — ECHOCARDIOGRAM, TRANSESOPHAGEAL
Anesthesia: Monitor Anesthesia Care

## 2020-11-03 MED ORDER — QUETIAPINE FUMARATE 50 MG PO TABS
50.0000 mg | ORAL_TABLET | Freq: Two times a day (BID) | ORAL | Status: DC
  Administered 2020-11-03 – 2020-11-04 (×3): 50 mg
  Filled 2020-11-03 (×3): qty 1

## 2020-11-03 MED ORDER — PROPOFOL 1000 MG/100ML IV EMUL
5.0000 ug/kg/min | INTRAVENOUS | Status: DC
  Administered 2020-11-04: 30 ug/kg/min via INTRAVENOUS
  Administered 2020-11-04: 45 ug/kg/min via INTRAVENOUS
  Administered 2020-11-04: 30 ug/kg/min via INTRAVENOUS
  Administered 2020-11-04: 25 ug/kg/min via INTRAVENOUS
  Administered 2020-11-08 – 2020-11-09 (×2): 5 ug/kg/min via INTRAVENOUS
  Filled 2020-11-03 (×7): qty 100

## 2020-11-03 MED ORDER — SEVELAMER CARBONATE 800 MG PO TABS
800.0000 mg | ORAL_TABLET | Freq: Three times a day (TID) | ORAL | Status: DC
  Administered 2020-11-03: 800 mg
  Filled 2020-11-03 (×3): qty 1

## 2020-11-03 MED ORDER — DOXERCALCIFEROL 4 MCG/2ML IV SOLN
5.0000 ug | INTRAVENOUS | Status: DC
  Filled 2020-11-03 (×2): qty 4

## 2020-11-03 MED ORDER — DOXERCALCIFEROL 4 MCG/2ML IV SOLN
5.0000 ug | INTRAVENOUS | Status: DC
  Administered 2020-11-15 – 2020-12-08 (×3): 5 ug via INTRAVENOUS
  Filled 2020-11-03 (×18): qty 4

## 2020-11-03 MED ORDER — AMIODARONE HCL 200 MG PO TABS
400.0000 mg | ORAL_TABLET | Freq: Two times a day (BID) | ORAL | Status: DC
  Administered 2020-11-03 – 2020-11-04 (×4): 400 mg
  Filled 2020-11-03 (×4): qty 2

## 2020-11-03 MED ORDER — IOHEXOL 300 MG/ML  SOLN
20.0000 mL | Freq: Once | INTRAMUSCULAR | Status: AC | PRN
  Administered 2020-11-03: 20 mL

## 2020-11-03 MED ORDER — ATORVASTATIN CALCIUM 80 MG PO TABS
80.0000 mg | ORAL_TABLET | Freq: Every day | ORAL | Status: DC
  Administered 2020-11-03 – 2020-11-23 (×17): 80 mg
  Filled 2020-11-03 (×17): qty 1

## 2020-11-03 MED ORDER — PROSOURCE TF PO LIQD
45.0000 mL | Freq: Four times a day (QID) | ORAL | Status: DC
  Administered 2020-11-03 – 2020-11-09 (×16): 45 mL
  Filled 2020-11-03 (×18): qty 45

## 2020-11-03 MED ORDER — B COMPLEX-C PO TABS
1.0000 | ORAL_TABLET | Freq: Every day | ORAL | Status: DC
  Administered 2020-11-03 – 2020-11-04 (×2): 1 via ORAL
  Filled 2020-11-03 (×3): qty 1

## 2020-11-03 MED ORDER — VITAL AF 1.2 CAL PO LIQD
1000.0000 mL | ORAL | Status: DC
  Administered 2020-11-03 – 2020-11-06 (×3): 1000 mL

## 2020-11-03 MED ORDER — TICAGRELOR 90 MG PO TABS
90.0000 mg | ORAL_TABLET | Freq: Two times a day (BID) | ORAL | Status: DC
  Administered 2020-11-03 – 2020-11-07 (×9): 90 mg
  Filled 2020-11-03 (×9): qty 1

## 2020-11-03 MED ORDER — SEVELAMER CARBONATE 0.8 G PO PACK
0.8000 g | PACK | Freq: Three times a day (TID) | ORAL | Status: DC
  Administered 2020-11-03 – 2020-11-13 (×18): 0.8 g
  Filled 2020-11-03 (×32): qty 1

## 2020-11-03 MED ORDER — SEVELAMER CARBONATE 800 MG PO TABS: 800.0000 mg | ORAL_TABLET | Freq: Three times a day (TID) | ORAL | Status: DC

## 2020-11-03 NOTE — Progress Notes (Signed)
At start of shift, blood flow rate on CRRT was at 90 mL/min. Goal BFR per nephrology is 300 but bedside RNs reportedly had trouble going up on rate d/t alarms and high CRRT pressures. Access port was resulting in extremely negative pressure alarms, so access and return lines were switched this AM. Was able to get BFR up to 150 mL/min before CRRT was stopped for patient to go to IR around 1145.   CRRT re-started at 1425 after patient returned from IR. New filter primed and patient connected with access and return lines to respective ports. Multiple access and return pressure alarms occurred after CRRT initiation, so access and return lines were once again switched. CRRT pressures improved after this, but BFR still remained low at 150. HD cath dressing was then changed and upon removing prior dressing, catheter appeared kinked near insertion site and many large clots were present under dressing. Thrombi-pad had been applied to site and was likely exerting extra pressure on catheter. CRRT pressures immediately improved after old dressing removed, and was able to get BFR to 200. After dressing change, was able to increase BFR further to 250 w/ appropriate CRRT pressures. Patient tolerated changes well and BP remained stable w/ MAPs 70-80s.  [employer] RN

## 2020-11-03 NOTE — Progress Notes (Signed)
Progress Note  Patient Name: Travis [name] Date of Encounter: 11/03/2020  [facility] Cardiologist: Glenetta Hew, MD   Patient Profile     59 y.o. male with ESRD on IHD, Ascending AoTAA - admitted with  Anterior STEMI 11/21 ->  PCI of the LAD. He has residual disease in his right coronary and AVG-Cx for planned PCI.  PTA was not feeing well for several days - chills & nausea.  On Cath - LVEDP 30-35 mmHg -> had been drinking lots of H20 & in settting of ACS - acute DHF --> developed pulmonary Edema = Acute HD.   Was then febrile PM 11/20-21 - > now 2/2 BC + Staph a (1 reported MSSA).  Overnight 11/21-22 -> went into Afib RVR - became hypotensive - Now in Septic Shock. after initial restoration of sinus rhythm, went back into A. fib RVR Now in Septic Shock. ->  Chest x-ray and CT of the chest on 11/23 confirm right lower lobe staph pneumonia -> DCCV on 10/31/2020-> followed by intubation by late afternoon. Overnight 11/23-24, had significant agitation requiring sedation that led to PEA arrest with short CPR and ROSC.  Core-Pac OGT placed in IR  Principal Problem:   Acute ST elevation myocardial infarction (STEMI) of anterolateral wall (HCC) Active Problems:   ESRD (end stage renal disease) ([place])   3 V- CAD w/ ACS/STEMI: Culprit = 99% mLAD @ D1 (DES PCI jailing D1); 99% ost-AVGLCx, 80% calcified napkin ring prox RCA.    Presence of drug coated stent in LAD coronary artery: Resolute Onyx DES 2.75 mm x 18 mm (3.1 mm) at major D1 & SP1.   Acute combined systolic and diastolic heart failure (HCC)   Atrial fibrillation with RVR (HCC)   MSSA bacteremia   Septic shock due to Staphylococcus aureus (HCC)   Pneumonia of right lower lobe due to methicillin susceptible Staphylococcus aureus (MSSA) (HCC)   Severe mitral regurgitation   Hyperlipidemia with target LDL less than 70   Essential hypertension   Cardiac arrest (HCC)   Subjective   Stable night overnight from a hemodynamic  standpoint.  Slowly been weaning down pressors, mostly now present to allow for sedation.  Inpatient Medications    Scheduled Meds: . sodium chloride   Intravenous Once  . amiodarone  400 mg Per Tube BID  . aspirin  150 mg Rectal Daily  . atorvastatin  80 mg Per Tube q1800  . B-complex with vitamin C  1 tablet Oral Daily  . chlorhexidine gluconate (MEDLINE KIT)  15 mL Mouth Rinse BID  . Chlorhexidine Gluconate Cloth  6 each Topical Q0600  . docusate  100 mg Per Tube BID  . [START ON 11/06/2020] doxercalciferol  5 mcg Intravenous Q M,W,F  . feeding supplement (PROSource TF)  45 mL Per Tube QID  . hydrocortisone sod succinate (SOLU-CORTEF) inj  50 mg Intravenous Q8H  . lidocaine  1 patch Transdermal Q24H  . mouth rinse  15 mL Mouth Rinse 10 times per day  . polyethylene glycol  17 g Per Tube Daily  . sevelamer carbonate  800 mg Per Tube TID  . sodium chloride flush  10-40 mL Intracatheter Q12H  . sodium chloride flush  3 mL Intravenous Q12H  . Thrombi-Pad  1 each Topical Once  . ticagrelor  90 mg Per Tube BID   Continuous Infusions: .  prismasol BGK 4/2.5 400 mL/hr at 11/02/20 2303  .  prismasol BGK 4/2.5 200 mL/hr at 11/02/20 1647  .  sodium chloride 10 mL/hr at 11/03/20 1400  . sodium chloride Stopped (10/28/20 0954)  . sodium chloride 10 mL/hr at 11/03/20 1400  .  ceFAZolin (ANCEF) IV Stopped (11/03/20 9191)  . famotidine (PEPCID) IV Stopped (11/03/20 1009)  . feeding supplement (VITAL AF 1.2 CAL)    . heparin 1,400 Units/hr (11/03/20 1401)  . HYDROmorphone 2 mg/hr (11/03/20 1400)  . nitroGLYCERIN Stopped (10/28/20 2100)  . norepinephrine (LEVOPHED) Adult infusion 18 mcg/min (11/03/20 1400)  . prismasol BGK 2/2.5 dialysis solution 2,000 mL/hr at 11/03/20 1041  . propofol (DIPRIVAN) infusion 45 mcg/kg/min (11/03/20 1400)  . vasopressin 0.03 Units/min (11/03/20 1400)   PRN Meds: sodium chloride, acetaminophen, heparin, HYDROmorphone, HYDROmorphone (DILAUDID) injection,  HYDROmorphone (DILAUDID) injection, LORazepam, midazolam, nitroGLYCERIN, ondansetron (ZOFRAN) IV, sodium chloride, sodium chloride flush, sodium chloride flush   Vital Signs    Vitals:   11/03/20 1315 11/03/20 1330 11/03/20 1345 11/03/20 1400  BP:      Pulse: 98 76 73 80  Resp: 15 15 15 15   Temp:      TempSrc:      SpO2: 95% 96% 96% 96%  Weight:      Height:        Intake/Output Summary (Last 24 hours) at 11/03/2020 1422 Last data filed at 11/03/2020 1400 Gross per 24 hour  Intake 3092.94 ml  Output 3198 ml  Net -105.06 ml   Last 3 Weights 11/03/2020 11/02/2020 10/29/2020  Weight (lbs) 175 lb 14.8 oz 179 lb 7.3 oz 182 lb 8.7 oz  Weight (kg) 79.8 kg 81.4 kg 82.8 kg      Telemetry    A. fib in the 80s to 90s.- Personally Reviewed  ECG    AFib / Flutter,  91 bpm: .  Borderline IVCD with TWI I & aVL. - Personally Reviewed  Physical Exam   GEN:  Intubated and sedated.  Bed percussion on Neck:  Right IJ line in place.  Less. Cardiac:  Irregularly regular rhythm, normal rate, S1-S2 normal but 2/6 HSM at apex noted.   Respiratory:  Diffuse coarse breath sounds with rhonchorous sounds in the right base. GI:  Soft/NT/ND STEMI BS.  No HSM. MS:  No C/C/C. Neuro/ PSYCH: Intubated and sedated.  Labs    High Sensitivity Troponin:   Recent Labs  Lab 10/28/20 0629 10/28/20 1046 10/28/20 2040 10/29/20 2121 10/30/20 0548  TROPONINIHS 3,155* >27,000* >27,000* >27,000* >27,000*      Chemistry Recent Labs  Lab 10/28/20 0604 10/29/20 0101 10/30/20 0548 10/31/20 0830 11/02/20 0301 11/02/20 0301 11/02/20 1551 11/02/20 2352 11/03/20 0446  NA 133*   < > 125*   < > 136   < > 135 134* 134*  K 3.7   < > 5.2*   < > 4.9   < > 4.8 5.0 5.0  CL 86*   < > 86*   < > 98   < > 97* 96* 96*  CO2 31   < > 22   < > 23   < > 23 23 23   GLUCOSE 140*   < > 148*   < > 99   < > 101* 116* 121*  BUN 30*   < > 75*   < > 60*   < > 39* 36* 34*  CREATININE 9.73*   < > 11.18*   < > 6.78*   < >  5.16* 4.20* 4.19*  CALCIUM 9.8   < > 9.1   < > 9.1   < > 8.9 9.4 9.8  PROT 6.6  --  5.6*  --   --   --   --   --   --   ALBUMIN 3.1*  --  2.2*   < > 1.6*  --  1.7*  --  1.7*  AST 84*  --  87*  --   --   --   --   --   --   ALT 87*  --  51*  --   --   --   --   --   --   ALKPHOS 146*  --  95  --   --   --   --   --   --   BILITOT 1.2  --  1.5*  --   --   --   --   --   --   GFRNONAA 6*   < > 5*   < > 9*   < > 12* 16* 16*  ANIONGAP 16*   < > 17*   < > 15   < > 15 15 15    < > = values in this interval not displayed.     Hematology Recent Labs  Lab 11/02/20 0301 11/02/20 2352 11/03/20 0446  WBC 27.9* 37.5* 37.3*  RBC 2.30* 2.46* 2.44*  HGB 7.5* 8.0* 7.8*  HCT 22.0* 24.0* 23.8*  MCV 95.7 97.6 97.5  MCH 32.6 32.5 32.0  MCHC 34.1 33.3 32.8  RDW 15.9* 16.4* 16.5*  PLT 295 355 339    BNPNo results for input(s): BNP, PROBNP in the last 168 hours.   DDimer No results for input(s): DDIMER in the last 168 hours.   Radiology    DG Abd 1 View  Result Date: 11/03/2020 CLINICAL DATA:  Feeding tube adjustment EXAM: ABDOMEN - 1 VIEW COMPARISON:  11/01/2020 FINDINGS: Feeding tube is in place. Contrast was injected through the feeding tube which demonstrates the tip in the duodenal bulb. IMPRESSION: Feeding tube tip in the proximal duodenum. Electronically Signed   By: Rolm Baptise M.D.   On: 11/03/2020 12:58   DG Abd 1 View  Result Date: 11/01/2020 CLINICAL DATA:  Orogastric tube placement EXAM: ABDOMEN - 1 VIEW COMPARISON:  Multiple prior abdominal studies and chest x-ray of the same date. FINDINGS: Gastric tube tip in the distal esophagus. Similar to the prior exam. Consolidative changes at the RIGHT lung base likely associated with small effusion also similar to the previous study. Vascular catheter with tip at the caval to atrial junction. Patchy opacities at the LEFT lung base. Mild gaseous distension of upper abdominal bowel loops, incomplete assessment. Limited assessment of skeletal  structures without acute process. IMPRESSION: 1. Gastric tube tip remains in the distal esophagus. 2. Airspace disease at the lung bases worse on the RIGHT as before. 3. Mild gaseous distension of upper abdominal bowel loops, incomplete assessment. Electronically Signed   By: Zetta Bills M.D.   On: 11/01/2020 15:56   DG CHEST PORT 1 VIEW  Result Date: 11/03/2020 CLINICAL DATA:  Respiratory failure.  History of hypertension. EXAM: PORTABLE CHEST 1 VIEW COMPARISON:  11/01/2020 FINDINGS: Endotracheal tube and right central venous catheter are unchanged in position. Cardiac enlargement. Bilateral basilar atelectasis or infiltration with some improvement in the right base since prior study. Probable small right pleural effusion. IMPRESSION: Bilateral basilar atelectasis or infiltration with some improvement in the right base since prior study. Electronically Signed   By: Lucienne Capers M.D.   On: 11/03/2020 06:05   DG Chest Port 1  View  Result Date: 11/01/2020 CLINICAL DATA:  59 year old male enteric tube placement. EXAM: PORTABLE CHEST 1 VIEW COMPARISON:  Portable chest 1209 hours today and earlier. FINDINGS: Portable AP semi upright view at 1536 hours. Enteric tube courses through the mediastinum into the left abdomen, tip not included. Right IJ approach dual lumen catheter and endotracheal tube appear stable. Stable cardiac size and mediastinal contours. Stable ventilation. No pneumothorax. Confluent right lung base opacity. IMPRESSION: 1. Enteric tube now extends into the left abdomen, tip not included. 2. Otherwise stable lines and tubes. 3. Stable ventilation with right lung base consolidation. Electronically Signed   By: Genevie Ann M.D.   On: 11/01/2020 15:57   ECHO TEE  Result Date: 11/01/2020    TRANSESOPHOGEAL ECHO REPORT   Patient Name:   Travis [name] Date of Exam: 11/01/2020 Medical Rec #:  [id number]         Height:       71.0 in Accession #:    [id number]        Weight:       182.5 lb  Date of Birth:  09/12/1961        BSA:          2.028 m Patient Age:    30 years          BP:           113/73 mmHg Patient Gender: M                 HR:           77 bpm. Exam Location:  Inpatient Procedure: Transesophageal Echo Indications:    endocarditis  History:        Patient has prior history of Echocardiogram examinations, most                 recent 11/01/2020. CAD; Risk Factors:Hypertension, Dyslipidemia                 and Former Smoker. STEMI.  Sonographer:    Jannett Celestine RDCS (AE) Referring Phys: [id number] [facility] A CHANDRASEKHAR PROCEDURE: The transesophogeal probe was passed without difficulty through the esophogus of the patient. Sedation performed by performing physician. The patient developed no complications during the procedure. IMPRESSIONS  1. Left ventricular ejection fraction, by estimation, is 35 to 40%. The left ventricle has moderately decreased function.  2. Right ventricular systolic function is normal. The right ventricular size is normal.  3. Left atrial size was severely dilated. No left atrial/left atrial appendage thrombus was detected.  4. Right atrial size was moderately dilated.  5. The mitral valve is grossly normal. Severe mitral valve regurgitation.  6. The aortic valve is tricuspid. There is moderate calcification of the aortic valve. Aortic valve regurgitation is moderate.  7. There is mild dilatation of the ascending aorta, measuring 40 mm. There is Moderate (Grade III) plaque.  8. The left upper pulmonary vein, left lower pulmonary vein and right upper pulmonary vein are abnormal. Conclusion(s)/Recommendation(s): There is evidence of severe mitral regurgitation; predominantely atrial functional mitral regurgitation with slight posterior tethering. There is associated moderate reduction in LV function. There is aortic regurgitation  with echodensities that could be consistent with calcification in patient with ESRD. There is mild ascending aorta dilation with moderate  aortic plaque. Discussed with patient and primary MD. FINDINGS  Left Ventricle: Left ventricular ejection fraction, by estimation, is 35 to 40%. The left ventricle has moderately decreased function. The left ventricular internal cavity size was  normal in size. Right Ventricle: The right ventricular size is normal. Right vetricular wall thickness was not assessed. Right ventricular systolic function is normal. Left Atrium: Left atrial size was severely dilated. No left atrial/left atrial appendage thrombus was detected. Right Atrium: Right atrial size was moderately dilated. Pericardium: The pericardium was not well visualized. Mitral Valve: There is severe eccentric mitral regurgitation. The mitral valve is grossly normal. Severe mitral valve regurgitation. Tricuspid Valve: The tricuspid valve is grossly normal. Tricuspid valve regurgitation is mild. Aortic Valve: Moderate echodensity on the aortic valve on the side of the ascending aorta, favoring calification; thought cannot exclude. The aortic valve is tricuspid. There is moderate calcification of the aortic valve. Aortic valve regurgitation is moderate. Aortic regurgitation PHT measures 393 msec. Pulmonic Valve: The pulmonic valve was grossly normal. Pulmonic valve regurgitation is not visualized. Aorta: The aortic root is normal in size and structure. There is mild dilatation of the ascending aorta, measuring 40 mm. There is moderate (Grade III) plaque. Venous: The left upper pulmonary vein, left lower pulmonary vein and right upper pulmonary vein are abnormal. IAS/Shunts: No atrial level shunt detected by color flow Doppler.  AORTIC VALVE AI PHT:      393 msec Rudean Haskell MD Electronically signed by Rudean Haskell MD Signature Date/Time: 11/01/2020/5:10:24 PM    Final     Cardiac Studies    Cath PCI 11/20: Acute anterolateral ST elevation MI with Culprit lesion being the 99% mid LAD lesion (just past major 1st Diag/D1) along with  multivessel CAD including 99% ostial AV groove LCx and 80% calcified napkin ring proximal RCA lesion with extensive calcification throughout the RCA. ? Successful PTCA and DES PCI of the LAD crossing D1 -resolute Onyx DES 2.75 mm x 18 mm postdilated to 3.1 mm.  Mild to moderate reduced EF with anterior anterolateral hypokinesis, EF roughly 40 to 45%.  Initial evaluation suggested normal EDP, but on recheck post PCI LVEDP of 30 mmHg, severely elevated consistent with ACUTE DIASTOLIC HEART FAILURE  Significantly dilated aortic root requiring AL-1 guide catheter for both Left and Right Coronary Angiography    TTE 10/31/2020: EF 35 to 40%. Moderate decreased function. Moderate concentric LVH GR 3 DD. Severe HK of the mid apical inferolateral and inferior wall with moderate HK of the apical septal and anterior wall. PA pressure estimated 43 mmHg. Severe LA dilation. Moderate RA dilation. Moderate to severe MR. Aortic root dilated at 41 mm. RV PSP 15 mmHg.   LFemV Central line placed by [facility] 11/21    TEE 11/01/2020: EF 35-40% moderate decreased function. Severe LA dilation. No LAA thrombus. Moderate RA dilation. 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 Mild dilation of ascending aorta measuring 40 mm. Moderate grade 3 plaque..   11/01/2020: Right IJ dialysis catheter placed, right radial arterial line placed.  11/03/2020: CorPac placed inIR  Assessment & Plan    Principal Problem:   Acute ST elevation myocardial infarction (STEMI) of anterolateral wall (HCC) Active Problems:   ESRD (end stage renal disease) ([place])   3 V- CAD w/ ACS/STEMI: Culprit = 99% mLAD @ D1 (DES PCI jailing D1); 99% ost-AVGLCx, 80% calcified napkin ring prox RCA.    Presence of drug coated stent in LAD coronary artery: Resolute Onyx DES 2.75 mm x 18 mm (3.1 mm) at major  D1 & SP1.   Acute combined systolic and diastolic heart failure ([place])  Atrial fibrillation with RVR (HCC)   MSSA bacteremia   Septic shock due to Staphylococcus aureus (HCC)   Pneumonia of right lower lobe due to methicillin susceptible Staphylococcus aureus (MSSA) (HCC)   Severe mitral regurgitation   Hyperlipidemia with target LDL less than 70   Essential hypertension   Cardiac arrest ([place])  Principal Problem:   Acute ST elevation myocardial infarction (STEMI) of anterolateral wall (HCC) /  3 V- CAD w/ ACS/STEMI: Culprit = 99% mLAD @ D1 (DES PCI jailing D1); 99% ost-AVGLCx, 80% calcified napkin ring prox RCA. /  Presence of drug coated stent in LAD coronary artery: Resolute Onyx DES 2.75 mm x 18 mm (3.1 mm) at major D1 & SP1. PCI of LAD- with planned staged PCI of RCA & AVG Cx-  Will have to put on hold given MSSA bacteremia  Remains on IV heparin for A. fib and CAD.  Hemoglobin stable.    With CorPak in place, will switch back to Brilinta today -> plan will be to convert to Plavix on discharge in order to allow for warfarin    End stage renal disease on dialysis [facility]) - on HD M-W-F (required urgent cath on 11/20 PM 2/2 volume overload & Acute DHF- 4 L off).  Nephrology consult on board  Currently on CRRT with gentle volume removal.  Pressor support being used to allow for volume removal.    Acute combined systolic and diastolic heart failure ([place]) (complicated with Volume overload & accelerated HTN) --> initial pulmonary edema resolved with HD on Saturday post Cath. ->  Now confirmed Severe MR (likely functional)  Co-Ox suggested more septic shock and cardiogenic  Continue pain control with HD/CRRT.    While on pressors, unable to initiate standard CHF medications.  MR does not appear to be ischemic in nature, and in fact appears to be functional.  Perhaps with volume removal, this can be improved.     Atrial fibrillation with RVR ([place]) - initially had urgent DCCV; initially  back to SR shortly & retured to Afib RVR currently in 110-120s --> successful DCCV on 11/23, -> reverted back to A. fib this morning.  We will rebolus IV amiodarone, and then convert to 40 mg twice daily.  May consider recardioversion to avoid tachycardic response with attempted vent wean.  On IV heparin, once no procedures planned, would initiate warfarin with bridge, at that time will convert to Plavix.  (And stop aspirin)    Bacteremia due to Staphylococcus - MSSA; Septic shock due to Staphylococcus aureus (HCC)/right lower lobe staph pneumonia- PCCM consult & ID counsulted -> WBC elevated Febrile to 102.5 on 11/21 PM - current T max 98.4  Initial set blood cultures: 2/2 BC + for MSSA = started on Vanc-Zosyn -remains on cefazolin.  Now with staph pneumonia, will have at least 4 (if not 6) weeks of antibiotics.TEE performed 11/24-no signs of vegetation:  Follow-up BC x2 from both 11/23 and 11/24 remain NGTD  Sputum does also show positive staph not unexpected given staph pneumonia.  Slow wean from Levophed and vasopressin.  Currently now more for a line sedation and CRRT.  Chest x-ray today shows some worsening right lower lobe pneumonia, plan.  PCCM will be to start percussive therapy for pulmonary toilet.   Antibiotics per PCCM and ID -anticipate probably these for if not 6 weeks of antibiotics.    Hyperlipidemia with target LDL less than 70:  High dose statin started.      Essential hypertension -remains on pressors.  Anemia - Hgb 9.5 on admission, dropped to 6.1, now after 1 unit of blood, 7.9 now, more stable..  Does not appear to be bleeding.; on ESA per Nephrology  May require additional transfusion versus IV iron per nephrology.     Patient is critically ill due to hypoxic respiratory failure requiring mechanical and sedation.  He also has ventilator multivessel CAD status post PCI with ischemic CM and severe MR.  Now complicated by gram-positive cocci/MSSA  bacteremia/pneumonia with sepsis.  Also requiring CRRT for ESRD.  Critical care time: 25 minutes with patient / family ,15 min with chart review & decision making.  Total19mn   For questions or updates, please contact [email or web address] consult [email or web address] for contact info under        Signed, DGlenetta Hew MD  11/03/2020, 2:22 PM

## 2020-11-03 NOTE — Progress Notes (Addendum)
[place] [facility]  Date of Admission:  10/28/2020     Total days of antibiotics: 6         Current antibiotics: 11/22-c cefazolin  11/21 vanc/piptazo  Reason for visit: Follow up on MSSA bacteremia   ASSESSMENT AND PLAN:   Travis [name] is a 59 y.o. male with past medical history of hypertension, end-stage renal disease on intermittent hemodialysis, coronary artery disease, admitted with sepsis and anterior wall STEMI status post emergent PCI.  Found to have septic shock post procedure and MSSA bacteremia.  Repeat blood cultures are negative and transesophageal echo without definite evidence of vegetations (echodensities noted that could be consistent with calcification in a patient with end-stage renal disease).  CT chest from 11/23 with findings of pneumonia and sputum cultures with Staph aureus  #MSSA bacteremia #Pneumonia #Leukocytosis #ESRD #STEMI status post PCI  --Continue renally dosed cefazolin --Follow-up repeat blood cultures --Ongoing supportive care per critical care and cardiology --Will need lines exchanged/removed that were placed while bacteremic at some point --Increasing leukocytosis noted but clinically improved/stable.  Vent settings are coming down.  ? Delayed rise related to bacteremia/pneumonia.  Will follow closely.    SUBJECTIVE:   Repeat blood cultures from 11/22, 11/23, and 11/24 are no growth to date Remains on the ventilator.  Discussed with his wife.  Unable to obtain subjective due to intubated status   OBJECTIVE:   Allergies  Allergen Reactions  . Ibuprofen Hives  . Lisinopril Swelling    PT states he is allergic to all prils; caused facial swelling  . Naproxen Hives and Other (See Comments)    Alleve causes patient to have hives    Blood pressure 111/61, pulse 90, temperature 97.9 F (36.6 C), resp. rate 15, height 5\' 11"  (1.803 m), weight 79.8 kg, SpO2 96 %. Body mass index is 24.54  kg/m.  Physical Exam  General: Patient is currently intubated and sedated on the ventilator. Wife is present Head: Normocephalic and atraumatic.  ET tube in place Pulmonary/Chest: Breathing assisted on vent; symmetric chest rise and fall. Musculoskeletal: No joint deformities Neurological: Sedated Skin: Warm, dry and intact. No rashes or erythema.   Lab Results & Microbiology Lab Results  Component Value Date   WBC 37.3 (H) 11/03/2020   HGB 7.8 (L) 11/03/2020   HCT 23.8 (L) 11/03/2020   MCV 97.5 11/03/2020   PLT 339 11/03/2020    Lab Results  Component Value Date   NA 134 (L) 11/03/2020   K 5.0 11/03/2020   CO2 23 11/03/2020   GLUCOSE 121 (H) 11/03/2020   BUN 34 (H) 11/03/2020   CREATININE 4.19 (H) 11/03/2020   CALCIUM 9.8 11/03/2020   GFRNONAA 16 (L) 11/03/2020   GFRAA 6 (L) 02/26/2018    Lab Results  Component Value Date   ALT 51 (H) 10/30/2020   AST 87 (H) 10/30/2020   ALKPHOS 95 10/30/2020   BILITOT 1.5 (H) 10/30/2020     I have reviewed the micro and lab results in Epic.  Imaging CT ABDOMEN PELVIS WO CONTRAST  Result Date: 11/01/2020 CLINICAL DATA:  Anemia, dropping hemoglobin, concern for retroperitoneal hematoma following cardiac catheterization EXAM: CT ABDOMEN AND PELVIS WITHOUT CONTRAST TECHNIQUE: Multidetector CT imaging of the abdomen and pelvis was performed following the standard protocol without IV contrast. COMPARISON:  None. FINDINGS: Lower chest: There are small bilateral pleural effusions and associated atelectasis or consolidation, with a significant amount of somewhat nodular heterogeneous airspace disease present in  the right greater than left lower lobes. Cardiomegaly. Three-vessel coronary artery calcifications. Hepatobiliary: No solid liver abnormality is seen. Excreted contrast in the gallbladder. No gallstones, gallbladder wall thickening, or biliary dilatation. Pancreas: Unremarkable. No pancreatic ductal dilatation or surrounding  inflammatory changes. Spleen: Normal in size without significant abnormality. Adrenals/Urinary Tract: Adrenal glands are unremarkable. Status post right nephrectomy. The left kidney is atrophic. Bladder is unremarkable. Stomach/Bowel: Stomach is within normal limits. Appendix appears normal. No evidence of bowel wall thickening, distention, or inflammatory changes. Colonic diverticulosis. Vascular/Lymphatic: Aortic atherosclerosis. Left femoral venous catheter. No enlarged abdominal or pelvic lymph nodes. Reproductive: No mass or other significant abnormality. Other: Mild anasarca.  No abdominopelvic ascites. Musculoskeletal: No acute or significant osseous findings. Renal osteodystrophy. IMPRESSION: 1. No evidence of retroperitoneal hematoma or other nidus of hemorrhage. 2. There are small bilateral pleural effusions and associated atelectasis or consolidation, with a significant amount of somewhat nodular heterogeneous airspace disease present in the right greater than left lower lobes. Findings are concerning for infection or aspiration 3. Cardiomegaly and coronary artery disease. 4. Status post right nephrectomy. Aortic Atherosclerosis (ICD10-I70.0). Electronically Signed   By: Eddie Candle M.D.   On: 11/01/2020 10:58   DG Abd 1 View  Result Date: 11/01/2020 CLINICAL DATA:  Orogastric tube placement EXAM: ABDOMEN - 1 VIEW COMPARISON:  Multiple prior abdominal studies and chest x-ray of the same date. FINDINGS: Gastric tube tip in the distal esophagus. Similar to the prior exam. Consolidative changes at the RIGHT lung base likely associated with small effusion also similar to the previous study. Vascular catheter with tip at the caval to atrial junction. Patchy opacities at the LEFT lung base. Mild gaseous distension of upper abdominal bowel loops, incomplete assessment. Limited assessment of skeletal structures without acute process. IMPRESSION: 1. Gastric tube tip remains in the distal esophagus. 2.  Airspace disease at the lung bases worse on the RIGHT as before. 3. Mild gaseous distension of upper abdominal bowel loops, incomplete assessment. Electronically Signed   By: Zetta Bills M.D.   On: 11/01/2020 15:56   DG Abd 1 View  Result Date: 11/01/2020 CLINICAL DATA:  Check gastric catheter placement EXAM: ABDOMEN - 1 VIEW COMPARISON:  Film from earlier in the same day. FINDINGS: Gastric catheter is noted in the distal esophagus with the tip just above the gastroesophageal junction. This should be advanced several cm further into the stomach. IMPRESSION: Gastric catheter in the distal esophagus. This should be advanced several cm deeper into the stomach Electronically Signed   By: Inez Catalina M.D.   On: 11/01/2020 12:59   DG Abd 1 View  Result Date: 11/01/2020 CLINICAL DATA:  Orogastric tube positioning EXAM: ABDOMEN - 1 VIEW COMPARISON:  October 31, 2020 FINDINGS: Orogastric tube tip is in the distal esophagus. There is mild dilatation of the stomach with air. No other bowel dilatation. No free air. Airspace opacity in the lung bases, more severe on the right than on the left. IMPRESSION: Orogastric tube tip in distal esophagus. Advise advancing orogastric tube approximately 12 cm. No bowel obstruction or free air.  Moderate air in stomach. Infiltrate in the lung bases, more severe on the right than on the left. These results will be called to the ordering clinician or representative by the Radiologist Assistant, and communication documented in the PACS or [employer]. Electronically Signed   By: Lowella Grip III M.D.   On: 11/01/2020 12:21   DG CHEST PORT 1 VIEW  Result Date: 11/03/2020 CLINICAL DATA:  Respiratory failure.  History of hypertension. EXAM: PORTABLE CHEST 1 VIEW COMPARISON:  11/01/2020 FINDINGS: Endotracheal tube and right central venous catheter are unchanged in position. Cardiac enlargement. Bilateral basilar atelectasis or infiltration with some improvement in the  right base since prior study. Probable small right pleural effusion. IMPRESSION: Bilateral basilar atelectasis or infiltration with some improvement in the right base since prior study. Electronically Signed   By: Lucienne Capers M.D.   On: 11/03/2020 06:05   DG Chest Port 1 View  Result Date: 11/01/2020 CLINICAL DATA:  59 year old male enteric tube placement. EXAM: PORTABLE CHEST 1 VIEW COMPARISON:  Portable chest 1209 hours today and earlier. FINDINGS: Portable AP semi upright view at 1536 hours. Enteric tube courses through the mediastinum into the left abdomen, tip not included. Right IJ approach dual lumen catheter and endotracheal tube appear stable. Stable cardiac size and mediastinal contours. Stable ventilation. No pneumothorax. Confluent right lung base opacity. IMPRESSION: 1. Enteric tube now extends into the left abdomen, tip not included. 2. Otherwise stable lines and tubes. 3. Stable ventilation with right lung base consolidation. Electronically Signed   By: Genevie Ann M.D.   On: 11/01/2020 15:57   DG CHEST PORT 1 VIEW  Result Date: 11/01/2020 CLINICAL DATA:  Central catheter placement EXAM: PORTABLE CHEST 1 VIEW COMPARISON:  November 01, 2020 study obtained earlier in the day FINDINGS: Central catheter tip is in the superior vena cava. Endotracheal tube tip is 3.4 cm above the carina. Nasogastric tube tip is not seen below the level of the distal esophagus. No pneumothorax. There is patchy airspace opacity bilaterally, somewhat more severe on the right than on the left, stable. No new opacity evident. Heart upper normal in size with pulmonary vascularity normal. No adenopathy. No bone lesions. IMPRESSION: Tube and catheter positions as described without pneumothorax. Note that nasogastric tube tip is not seen beyond distal esophagus. Multifocal airspace opacity consistent with pneumonia, more severe on the right than on the left. No new opacity appreciable compared to earlier in the day.  Stable cardiac prominence. Electronically Signed   By: Lowella Grip III M.D.   On: 11/01/2020 12:20   ECHO TEE  Result Date: 11/01/2020    TRANSESOPHOGEAL ECHO REPORT   Patient Name:   Travis [name] Date of Exam: 11/01/2020 Medical Rec #:  [id number]         Height:       71.0 in Accession #:    [id number]        Weight:       182.5 lb Date of Birth:  03/09/1961        BSA:          2.028 m Patient Age:    14 years          BP:           113/73 mmHg Patient Gender: M                 HR:           77 bpm. Exam Location:  Inpatient Procedure: Transesophageal Echo Indications:    endocarditis  History:        Patient has prior history of Echocardiogram examinations, most                 recent 11/01/2020. CAD; Risk Factors:Hypertension, Dyslipidemia                 and Former Smoker. STEMI.  Sonographer:    Doyne Keel  Shankar RDCS (AE) Referring Phys: [id number] [facility] A CHANDRASEKHAR PROCEDURE: The transesophogeal probe was passed without difficulty through the esophogus of the patient. Sedation performed by performing physician. The patient developed no complications during the procedure. IMPRESSIONS  1. Left ventricular ejection fraction, by estimation, is 35 to 40%. The left ventricle has moderately decreased function.  2. Right ventricular systolic function is normal. The right ventricular size is normal.  3. Left atrial size was severely dilated. No left atrial/left atrial appendage thrombus was detected.  4. Right atrial size was moderately dilated.  5. The mitral valve is grossly normal. Severe mitral valve regurgitation.  6. The aortic valve is tricuspid. There is moderate calcification of the aortic valve. Aortic valve regurgitation is moderate.  7. There is mild dilatation of the ascending aorta, measuring 40 mm. There is Moderate (Grade III) plaque.  8. The left upper pulmonary vein, left lower pulmonary vein and right upper pulmonary vein are abnormal. Conclusion(s)/Recommendation(s): There is  evidence of severe mitral regurgitation; predominantely atrial functional mitral regurgitation with slight posterior tethering. There is associated moderate reduction in LV function. There is aortic regurgitation  with echodensities that could be consistent with calcification in patient with ESRD. 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 Discussed with patient and primary MD. FINDINGS  Left Ventricle: Left ventricular ejection fraction, by estimation, is 35 to 40%. The left ventricle has moderately decreased function. The left ventricular internal cavity size was normal in size. Right Ventricle: The right ventricular size is normal. Right vetricular wall thickness was not assessed. Right ventricular systolic function is normal. Left Atrium: Left atrial size was severely dilated. No left atrial/left atrial appendage thrombus was detected. Right Atrium: Right atrial size was moderately dilated. Pericardium: The pericardium was not well visualized. Mitral Valve: There is severe eccentric mitral regurgitation. The mitral valve is grossly normal. Severe mitral valve regurgitation. Tricuspid Valve: The tricuspid valve is grossly normal. Tricuspid valve regurgitation is mild. Aortic Valve: Moderate echodensity on the aortic valve on the side of the ascending aorta, favoring calification; thought cannot exclude. The aortic valve is tricuspid. There is moderate calcification of the aortic valve. Aortic valve regurgitation is moderate. Aortic regurgitation PHT measures 393 msec. Pulmonic Valve: The pulmonic valve was grossly normal. Pulmonic valve regurgitation is not visualized. Aorta: The aortic root is normal in size and structure. There is mild dilatation of the ascending aorta, measuring 40 mm. There is moderate (Grade III) plaque. Venous: The left upper pulmonary vein, left lower pulmonary vein and right upper pulmonary vein are abnormal. IAS/Shunts: No atrial level shunt detected by color  flow Doppler.  AORTIC VALVE AI PHT:      393 msec Rudean Haskell MD Electronically signed by Rudean Haskell MD Signature Date/Time: 11/01/2020/5:10:24 PM    Final         [facility] [phone] pager 11/03/2020, 9:08 AM

## 2020-11-03 NOTE — Progress Notes (Addendum)
Nutrition Follow Up  DOCUMENTATION CODES:   Not applicable  INTERVENTION:   No BM documented in 6 days, scheduled regimen in place  Tube feeding:  -Vital AF 1.2 @ 50 ml/hr via SBFT -ProSource TF 45 ml QID -B complex with Vitamin C to account for losses with CRRT  Provides: 1600 kcals (2191 kcal with propofol), 134 grams protein, 973 ml free water.   NUTRITION DIAGNOSIS:   Increased nutrient needs related to acute illness as evidenced by estimated needs.  Ongoing  GOAL:   Patient will meet greater than or equal to 90% of their needs   Progressing  MONITOR:   Vent status, Skin, TF tolerance, Weight trends, Labs, I & O's  REASON FOR ASSESSMENT:   Ventilator    ASSESSMENT:   Patient with PMH significant for HTN and ESRD in HD. Presents this admission with STEMI.   11/20- s/p PCI to LAD 11/22- HD 11/23- dyspnea/agitation, intubated 11/24- start CRRT, TEE, severe LV dysfunction, EF 35%, moderate to severe mitral valve dysfunction   Pressor requirement decreasing. Remains on propofol. Multiple attempts made to pass Cortrak through the esophagus without success. Post-pyloric small bore feeding tube placed in diagnostic radiology. Titrate tube feeding to goal. No BM documented in 6 days, scheduled regimen in place.   Patient remains intubated on ventilator support MV: 8.8 L/min Temp (24hrs), Avg:97.9 F (36.6 C), Min:97.4 F (36.3 C), Max:98.5 F (36.9 C)  Propofol: 22.4 ml/hr- provides 591 kcal from lipids daily    EDW: 81.5 kg  Current weight: 79.8 kg  CRRT: 3289 ml x 24 hrs   Drips: levophed, propofol, vasopressin  Medications: colace, hectorol, solucortef, miralax, renvela Labs: Na 134 (L) Phosphorus 6.3 (H) Mg 3.0 (H) CBG 99-169  Diet Order:   Diet Order    None      EDUCATION NEEDS:   Not appropriate for education at this time  Skin:  Skin Assessment: Reviewed RN Assessment  Last BM:  11/20  Height:   Ht Readings from Last 1 Encounters:   10/28/20 5\' 11"  (1.803 m)    Weight:   Wt Readings from Last 1 Encounters:  11/03/20 79.8 kg    BMI:  Body mass index is 24.54 kg/m.  Estimated Nutritional Needs:   Kcal:  1803 kcal  Protein:  125-150 g  Fluid:  1000 ml + UOP (liberalized with CRRT)  Mariana Single RD, LDN Clinical Nutrition Pager listed in [place]

## 2020-11-03 NOTE — Progress Notes (Signed)
NAME:  Travis [name], MRN:  [id number], DOB:  01/07/1961, LOS: 6 ADMISSION DATE:  10/28/2020, CONSULTATION DATE:  10/29/20 REFERRING MD:  Cardiology - [name], CHIEF COMPLAINT:  Hypotension, gram positive cocci on culture, AMS  Brief History   Mr. Beske is a 59 year old man with a history of HTN, ESRD on HD MWF, RUE AV fistula, hx of smoking, admitted 10/28/20 with fevers, myalgias for several days, acute chest pain.    History of present illness   On Admission, found to have STEMI, multivessel disease. Pulm edema with mild hypoxemia.  S/Post PCI to the LAD on 10/29/19. Planning for staged intervention to the left circumflex and possibly right coronary artery.  EF 18-29%, diastolic dysfunction, LVEDP 35. Underwent HD on 11/20 with 4 L volume removed.  Saturation improved after dialysis (100% on RA) Fevers noted 11/20. Blood cultures grew MSSA, noted this evening. .    This evening developed Afib with RVR 180s. Adenosine given, without improvement  Amiodarone infusion started per cardiology, 150mg  bolus, then drip.  Lopressor 5mg IV and 500ccNS given. Started on Phenylephrine, was on 283mcg on my arrival.   Cardioverted emergently at 120J for ongoing afib with hypotension (60/40) and AMS.  Converted to sinus.  Remained moderately hypotensive (MAP 60) on phenylephrine.   Patient was given one dose zosyn, switched to ancef once cultures grew back MSSA.   Cardiac stress tests annually, last done 11/16/19.    Past Medical History  HTN ESRD, HD MWF  Significant Hospital Events   Cardioversion 11/21 Cardiac cath stent to LAD 11/20  Consults:  PCCM  Nephrology  Procedures:  Central line 11/21  Arterial line 11/21 Repeat DCCV 11/23 HD line 11/24 Arterial line 11/24  Significant Diagnostic Tests:  11/23 Echo Pending 11/22 LUE Vas Upper Extremity Doppler>> Arteriovenous fistula-Aneurysmal dilatation noted. 11/24 CT abdomen - no retroperitoneal hematoma 11/24 transthoracic  echocardiogram-severe LV dysfunction EF 35% with restrictive diastolic function severe inferior wall motion abnormalities.  Moderate to severe mitral regurgitation. Micro Data:   11/21 Staph aureus > MSSA by BCID >>> 11/22 Cultures negative. Antimicrobials:  Zosyn 11/21 x 1 Ancef 11/21 ->  Interim history/subjective:   Decreasing vasopressor requirements. Remains sedated. For Cortrak today.   Objective   Blood pressure 111/61, pulse 90, temperature 97.9 F (36.6 C), resp. rate 15, height 5\' 11"  (1.803 m), weight 79.8 kg, SpO2 96 %.    Vent Mode: PRVC FiO2 (%):  [40 %-50 %] 40 % Set Rate:  [15 bmp] 15 bmp Vt Set:  [600 mL] 600 mL PEEP:  [10 cmH20] 10 cmH20 Plateau Pressure:  [15 cmH20-21 cmH20] 20 cmH20   Intake/Output Summary (Last 24 hours) at 11/03/2020 0906 Last data filed at 11/03/2020 0800 Gross per 24 hour  Intake 2887.72 ml  Output 3194 ml  Net -306.28 ml   Filed Weights   10/29/20 1630 11/02/20 0500 11/03/20 0500  Weight: 82.8 kg 81.4 kg 79.8 kg    Examination:  General: middle aged adult male, intubated and sedated.  HENT: ETT tube in place. Minimal secretions.  Lungs: no ventilator dyssynchrony. Normal vesicular breath sounds throughout. Cardiovascular: in sinus rhythm with warm extremities. Capillary refill 3s.  Abdomen: soft with no distention.  Extremities: no acute deformity or ROM limitation. No edema.  Neuro: sedated with. Will cough and move with stimulation.   Resolved Hospital Problem list     Assessment & Plan:   Critically ill due to severe sepsis with septic shock due to MSSA bacteremia requiring titration  of vasopressors. -Titrate norepinephrine and vasopressin to keep MAP greater than 65 -Much of the vasopressor requirement at this time may be related to sedation.  Critically ill due to acute hypoxic respiratory failure due to MSSA pneumonia with cavitation. -Continue full ventilatory support -Chest physiotherapy -Begin PEEP wean,  dropped to 8 today.  -SBT once PEEP at 5.   Critically ill due to acute toxic metabolic encephalopathy with agitation requiring titration of IV sedatives. -Difficult to enteral access.  Will need core track tomorrow -Once core track in place will start enteral sedation as adjunct. -Wean fentanyl and propofol as tolerated.  MSSA bacteremia likely from HD access.  No evidence of fistula or abscess.  Or endocarditis on TEE -Complete 6 weeks of IV cefazolin  Afib now cardioverted to sinus rhythm on amiodarone following DCCV - Cont amiodarone - will convert to enteral.   STEMI/CAD. On Heparin, brillinta, asa.   - Will eventually need a return to cath lab for staged PCI  Hyponatremia - nephrology following, last HD 11/22 - Trend BMET  ESRD:  -Start CRRT today   Best practice:  Diet: NPO May need fluoroscopically guided OGT Pain/Anxiety/Delirium protocol (if indicated): Propofol and hydromorphone infusions to RASS -3, have added lorazepam IV as needed to minimize propofol use. VAP protocol (if indicated): Bundle in place DVT prophylaxis: on heparin gtt GI prophylaxis: famotidine Glucose control: Phase 1 glycemic control Mobility: Bedrest Code Status: Full  Family Communication: Spoke with wife  at bedside 11/24 Disposition: ICU  Labs   CBC: Recent Labs  Lab 10/28/20 0604 10/29/20 0101 10/29/20 2121 10/29/20 2128 11/01/20 0430 11/01/20 0430 11/01/20 1454 11/01/20 2045 11/02/20 0301 11/02/20 2352 11/03/20 0446  WBC 12.7*   < > 12.9*   < > 14.7*  --   --  28.1* 27.9* 37.5* 37.3*  NEUTROABS 11.4*  --  11.8*  --   --   --   --   --   --   --   --   HGB 9.5*   < > 6.0*   < > 6.1*   < > 7.3* 7.6* 7.5* 8.0* 7.8*  HCT 29.5*   < > 18.8*   < > 19.3*   < > 21.7* 21.6* 22.0* 24.0* 23.8*  MCV 95.5   < > 93.5   < > 96.5  --   --  92.7 95.7 97.6 97.5  PLT 143*   < > 86*   < > 234  --   --  291 295 355 339   < > = values in this interval not displayed.    Basic Metabolic  Panel: Recent Labs  Lab 11/01/20 0430 11/01/20 0430 11/01/20 1514 11/01/20 1514 11/01/20 2048 11/02/20 0301 11/02/20 1551 11/02/20 2352 11/03/20 0446  NA 137   < > 138   < > 133* 136 135 134* 134*  K 5.0   < > 5.4*   < > 5.1 4.9 4.8 5.0 5.0  CL 95*   < > 92*   < > 95* 98 97* 96* 96*  CO2 23   < > 24   < > 20* 23 23 23 23   GLUCOSE 169*   < > 88   < > 103* 99 101* 116* 121*  BUN 82*   < > 88*   < > 75* 60* 39* 36* 34*  CREATININE 10.59*   < > 10.91*   < > 8.15* 6.78* 5.16* 4.20* 4.19*  CALCIUM 8.7*   < > 9.0   < >  8.9 9.1 8.9 9.4 9.8  MG 2.8*  --   --   --   --  2.8*  --  3.0* 3.0*  PHOS 8.3*  --  7.1*  --   --  5.8* 5.8*  --  6.3*   < > = values in this interval not displayed.   GFR: Estimated Creatinine Clearance: 20.5 mL/min (A) (by C-G formula based on SCr of 4.19 mg/dL (H)). Recent Labs  Lab 10/30/20 0820 10/31/20 0030 10/31/20 1327 11/01/20 0412 11/01/20 0430 11/01/20 0625 11/01/20 2045 11/02/20 0301 11/02/20 2352 11/03/20 0446  WBC  --    < >  --   --    < >  --  28.1* 27.9* 37.5* 37.3*  LATICACIDVEN 2.1*  --  2.5* 5.5*  --  2.1*  --   --   --   --    < > = values in this interval not displayed.    Liver Function Tests: Recent Labs  Lab 10/28/20 0604 10/28/20 0604 10/30/20 0548 10/31/20 0830 11/01/20 0430 11/01/20 1514 11/02/20 0301 11/02/20 1551 11/03/20 0446  AST 84*  --  87*  --   --   --   --   --   --   ALT 87*  --  51*  --   --   --   --   --   --   ALKPHOS 146*  --  95  --   --   --   --   --   --   BILITOT 1.2  --  1.5*  --   --   --   --   --   --   PROT 6.6  --  5.6*  --   --   --   --   --   --   ALBUMIN 3.1*   < > 2.2*   < > 1.6* 1.7* 1.6* 1.7* 1.7*   < > = values in this interval not displayed.   No results for input(s): LIPASE, AMYLASE in the last 168 hours. No results for input(s): AMMONIA in the last 168 hours.  ABG    Component Value Date/Time   PHART 7.295 (L) 11/01/2020 0336   PCO2ART 39.9 11/01/2020 0336   PO2ART 71 (L)  11/01/2020 0336   HCO3 19.4 (L) 11/01/2020 0336   TCO2 21 (L) 11/01/2020 0336   ACIDBASEDEF 7.0 (H) 11/01/2020 0336   O2SAT 82.0 11/01/2020 1130     Coagulation Profile: Recent Labs  Lab 10/28/20 0629 10/30/20 0548  INR 1.3* 1.2    Cardiac Enzymes: No results for input(s): CKTOTAL, CKMB, CKMBINDEX, TROPONINI in the last 168 hours.  HbA1C: Hgb A1c MFr Bld  Date/Time Value Ref Range Status  10/28/2020 06:04 AM 4.3 (L) 4.8 - 5.6 % Final    Comment:    (NOTE) Pre diabetes:          5.7%-6.4%  Diabetes:              >6.4%  Glycemic control for   <7.0% adults with diabetes   01/30/2015 09:09 PM 5.1 4.8 - 5.6 % Final    Comment:    (NOTE)         Pre-diabetes: 5.7 - 6.4         Diabetes: >6.4         Glycemic control for adults with diabetes: <7.0     CBG: Recent Labs  Lab 10/28/20 1120 11/01/20 0330 11/01/20 0502 11/02/20 2356  GLUCAP  111* 108* 106* 106*    Critical care time: 40 minutes     Kipp Brood, MD [facility] ICU Physician [place]  Pager: [phone] Or Epic Secure Chat After hours: [phone].  11/03/2020, 9:06 AM

## 2020-11-03 NOTE — Progress Notes (Signed)
Cortrak Tube Team Note:  Consult received to place a Cortrak feeding tube.   Attempt made to place Cortrak tube; unable to advance tube past 55cm. Tube secured at 55cm with nasal bridle. Recommend Fluroscopy guided nasogastric tube advancement.   Koleen Distance MS, RD, LDN Please refer to [facility] for RD and/or RD on-call/weekend/after hours pager

## 2020-11-03 NOTE — Progress Notes (Signed)
[facility] Progress Note   Assessment/ Plan:   OP HD:AF MWF 4h 450/800 81.5kg 2/2.25 bath RUE AVF Hep 5000+ 105mdrun - hect 5 ug tiw  - mircera 50 ug q 4, last 11/15 (due 11/29)    Assessment/ Plan: 1. Acute STEMI - SP LAD PCI 11/20 am, has other disease in LCx/ RCA. On Cangrelor. Per cardiology. 2. Acute hypoxic RF and cavitary pneumonia - had pulm edema by CXR w/ SOB / coughing. Initially better but had more tachypnea and coughing, had CT chest  11/23 with multifocal PNA and likely cavitary lesion.  Intubated 11/23.  Per PCCM.  Respiratory culture 11/23 with MSSA 3. PEA arrest: occurred in setting of agitation/ sedation.  ROSC with 1 mg epi/ CPR/ 1 min. 4. MSSA bacteremia- cultures initially positive 11/21.  on cefazolin,  Cultures 11/22, 11/23, and 11/24 all negative, plan for 6 weeks of IV cefazolin per pharmacy.  CT abd/ pelvis negative 11/23.  Dialysis duplex 11/22 without abscess- was getting dialysis at the time of the study so will d/w PVL lab to make sure study was adequate.   5. ESRD - usual HD MWF. Had LHC w/ high LVEDP +35 mmHg. Lowering vol w/ HD as tolerated. Started CRRT 11/24, 4K pre and post, 2K dialysate, no heparin d/t hep gtt  6. Shock: presumably septic+/- cardiogenic, pressors and stress dose steroids per PCCM 7. 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 Afib: on amio and hep gtt, s/p DCCV 10. Severe MR: per cardiology  Subjective:    Seen in room.  Pressor requirements coming down.     Objective:   BP 111/61   Pulse 92   Temp 97.9 F (36.6 C)   Resp 15   Ht _0  (1.803 m)   Wt 79.8 kg   SpO2 97%   BMI 24.54 kg/m   Physical Exam: GJXB:JYNWGin bed, intubated, sedated CNFA:[id number] no m/r/g Resp: tachypneic Abd: soft Ext: no gross edema ACCESS: LUE AVF + T/B  Labs: BMET Recent Labs  Lab 10/31/20 0830 10/31/20 1811 11/01/20 0430  11/01/20 1514 11/01/20 2048 11/02/20 0301 11/02/20 1551 11/02/20 2352 11/03/20 0446  NA 136   < > 137 138 133* 136 135 134* 134*  K 5.1   < > 5.0 5.4* 5.1 4.9 4.8 5.0 5.0  CL 93*  --  95* 92* 95* 98 97* 96* 96*  CO2 26  --  23 24 20* _1 GLUCOSE 118*  --  169* 88 103* 99 101* 116* 121*  BUN 52*  --  82* 88* 75* 60* 39* 36* 34*  CREATININE 8.19*  --  10.59* 10.91* 8.15* 6.78* 5.16* 4.20* 4.19*  CALCIUM 8.9  --  8.7* 9.0 8.9 9.1 8.9 9.4 9.8  PHOS 4.9*  --  8.3* 7.1*  --  5.8* 5.8*  --  6.3*   < > = values in this interval not displayed.   CBC Recent Labs  Lab 10/28/20 0604 10/29/20 0101 10/29/20 2121 10/29/20 2128 11/01/20 2045 11/02/20 0301 11/02/20 2352 11/03/20 0446  WBC 12.7*   < > 12.9*   < > 28.1* 27.9* 37.5* 37.3*  NEUTROABS 11.4*  --  11.8*  --   --   --   --   --   HGB 9.5*   < > 6.0*   < > 7.6* 7.5* 8.0* 7.8*  HCT 29.5*   < >  18.8*   < > 21.6* 22.0* 24.0* 23.8*  MCV 95.5   < > 93.5   < > 92.7 95.7 97.6 97.5  PLT 143*   < > 86*   < > 291 295 355 339   < > = values in this interval not displayed.      Medications:    . sodium chloride   Intravenous Once  . aspirin  150 mg Rectal Daily  . atorvastatin  80 mg Per Tube q1800  . chlorhexidine gluconate (MEDLINE KIT)  15 mL Mouth Rinse BID  . Chlorhexidine Gluconate Cloth  6 each Topical Q0600  . docusate  100 mg Per Tube BID  . doxercalciferol  5 mcg Intravenous Q M,W,F  . hydrocortisone sod succinate (SOLU-CORTEF) inj  50 mg Intravenous Q8H  . lidocaine  1 patch Transdermal Q24H  . mouth rinse  15 mL Mouth Rinse 10 times per day  . polyethylene glycol  17 g Per Tube Daily  . sevelamer carbonate  800 mg Per Tube TID  . sodium chloride flush  10-40 mL Intracatheter Q12H  . sodium chloride flush  3 mL Intravenous Q12H  . Thrombi-Pad  1 each Topical Once     Madelon Lips MD 11/03/2020, 10:11 AM

## 2020-11-03 NOTE — Progress Notes (Signed)
[place] for Heparin Indication: chest pain/ACS  Allergies  Allergen Reactions  . Ibuprofen Hives  . Lisinopril Swelling    PT states he is allergic to all prils; caused facial swelling  . Naproxen Hives and Other (See Comments)    Alleve causes patient to have hives    Patient Measurements: Height: 5\' 11"  (180.3 cm) Weight: 79.8 kg (175 lb 14.8 oz) IBW/kg (Calculated) : 75.3 Heparin Dosing Weight: 86.4 kg   Vital Signs: Temp: 97.9 F (36.6 C) (11/26 0800) Temp Source: Oral (11/26 0400) BP: 111/61 (11/26 0353) Pulse Rate: 95 (11/26 0830)  Labs: Recent Labs    11/01/20 2045 11/01/20 2059 11/02/20 0301 11/02/20 0301 11/02/20 0955 11/02/20 1551 11/02/20 2352 11/03/20 0446  HGB   < >  --  7.5*   < >  --   --  8.0* 7.8*  HCT   < >  --  22.0*  --   --   --  24.0* 23.8*  PLT   < >  --  295  --   --   --  355 339  HEPARINUNFRC  --  0.47  --   --  0.34  --   --  0.45  CREATININE   < >  --  6.78*   < >  --  5.16* 4.20* 4.19*   < > = values in this interval not displayed.    Estimated Creatinine Clearance: 20.5 mL/min (A) (by C-G formula based on SCr of 4.19 mg/dL (H)).  Assessment: 50 yom presenting with STEMI. Known to have dilated ascending aorta, ESRD. Found to have 99% mid LAD with multivessel in LCx and RCA, now s/p DES to LAD. Heparin was started post sheath removal. Patient was found to have bacteremia and will not proceed with cath intervention at this time. Patient developed Afib during admission treated with amiodarone and successful cardioversion.   Heparin level therapeutic at 0.45 on drip rate 1400 units/hr. RN reports minor bleeding from line but this is stable and not oozing. Hgb 7.8, plts 339 stable. Remains on CRRT. Noted patient is started on cangrelor while Brilinta is on hold due to enteral access issue.  This combination may increase patient's risk for bleeding.  Goal of Therapy:  Heparin level 0.3-0.5 units/mL  while on cangrelor Monitor platelets by anticoagulation protocol: Yes   Plan:  Continue heparin infusion at 1400 units/hr.  Monitor daily HL, CBC, s/sx bleeding  Richardine Service, PharmD, BCPS PGY2 Cardiology Pharmacy Resident Phone: [phone] 11/03/2020  9:02 AM  Please check [email or web address] for unit-specific pharmacy phone numbers.

## 2020-11-04 ENCOUNTER — Inpatient Hospital Stay (HOSPITAL_COMMUNITY): Payer: Medicare Other

## 2020-11-04 DIAGNOSIS — I4891 Unspecified atrial fibrillation: Secondary | ICD-10-CM | POA: Diagnosis not present

## 2020-11-04 DIAGNOSIS — I469 Cardiac arrest, cause unspecified: Secondary | ICD-10-CM | POA: Diagnosis not present

## 2020-11-04 DIAGNOSIS — I2109 ST elevation (STEMI) myocardial infarction involving other coronary artery of anterior wall: Secondary | ICD-10-CM | POA: Diagnosis not present

## 2020-11-04 DIAGNOSIS — I5041 Acute combined systolic (congestive) and diastolic (congestive) heart failure: Secondary | ICD-10-CM | POA: Diagnosis not present

## 2020-11-04 LAB — GLUCOSE, CAPILLARY
Glucose-Capillary: 109 mg/dL — ABNORMAL HIGH (ref 70–99)
Glucose-Capillary: 123 mg/dL — ABNORMAL HIGH (ref 70–99)
Glucose-Capillary: 127 mg/dL — ABNORMAL HIGH (ref 70–99)
Glucose-Capillary: 128 mg/dL — ABNORMAL HIGH (ref 70–99)
Glucose-Capillary: 138 mg/dL — ABNORMAL HIGH (ref 70–99)
Glucose-Capillary: 149 mg/dL — ABNORMAL HIGH (ref 70–99)
Glucose-Capillary: 164 mg/dL — ABNORMAL HIGH (ref 70–99)

## 2020-11-04 LAB — MAGNESIUM
Magnesium: 2.9 mg/dL — ABNORMAL HIGH (ref 1.7–2.4)
Magnesium: 2.8 mg/dL — ABNORMAL HIGH (ref 1.7–2.4)

## 2020-11-04 LAB — RENAL FUNCTION PANEL
Albumin: 1.6 g/dL — ABNORMAL LOW (ref 3.5–5.0)
Albumin: 1.7 g/dL — ABNORMAL LOW (ref 3.5–5.0)
Anion gap: 13 (ref 5–15)
Anion gap: 13 (ref 5–15)
BUN: 29 mg/dL — ABNORMAL HIGH (ref 6–20)
BUN: 34 mg/dL — ABNORMAL HIGH (ref 6–20)
CO2: 25 mmol/L (ref 22–32)
CO2: 26 mmol/L (ref 22–32)
Calcium: 9.5 mg/dL (ref 8.9–10.3)
Calcium: 9.4 mg/dL (ref 8.9–10.3)
Chloride: 97 mmol/L — ABNORMAL LOW (ref 98–111)
Chloride: 95 mmol/L — ABNORMAL LOW (ref 98–111)
Creatinine, Ser: 3.28 mg/dL — ABNORMAL HIGH (ref 0.61–1.24)
Creatinine, Ser: 2.92 mg/dL — ABNORMAL HIGH (ref 0.61–1.24)
GFR, Estimated: 21 mL/min — ABNORMAL LOW (ref >60)
GFR, Estimated: 24 mL/min — ABNORMAL LOW (ref >60)
Glucose, Bld: 118 mg/dL — ABNORMAL HIGH (ref 70–99)
Glucose, Bld: 139 mg/dL — ABNORMAL HIGH (ref 70–99)
Phosphorus: 4.6 mg/dL (ref 2.5–4.6)
Phosphorus: 3.1 mg/dL (ref 2.5–4.6)
Potassium: 4 mmol/L (ref 3.5–5.1)
Potassium: 3.6 mmol/L (ref 3.5–5.1)
Sodium: 135 mmol/L (ref 135–145)
Sodium: 134 mmol/L — ABNORMAL LOW (ref 135–145)

## 2020-11-04 LAB — CULTURE, BLOOD (ROUTINE X 2)
Culture: NO GROWTH
Culture: NO GROWTH

## 2020-11-04 LAB — CBC
HCT: 22.5 % — ABNORMAL LOW (ref 39.0–52.0)
Hemoglobin: 7.4 g/dL — ABNORMAL LOW (ref 13.0–17.0)
MCH: 32.6 pg (ref 26.0–34.0)
MCHC: 32.9 g/dL (ref 30.0–36.0)
MCV: 99.1 fL (ref 80.0–100.0)
Platelets: 304 10*3/uL (ref 150–400)
RBC: 2.27 MIL/uL — ABNORMAL LOW (ref 4.22–5.81)
RDW: 17.4 % — ABNORMAL HIGH (ref 11.5–15.5)
WBC: 30.5 10*3/uL — ABNORMAL HIGH (ref 4.0–10.5)
nRBC: 2 % — ABNORMAL HIGH (ref 0.0–0.2)

## 2020-11-04 LAB — HEPARIN LEVEL (UNFRACTIONATED): Heparin Unfractionated: 0.25 IU/mL — ABNORMAL LOW (ref 0.30–0.70)

## 2020-11-04 LAB — TRIGLYCERIDES: Triglycerides: 525 mg/dL — ABNORMAL HIGH (ref <150)

## 2020-11-04 MED ORDER — HYDROCORTISONE NA SUCCINATE PF 100 MG IJ SOLR
25.0000 mg | Freq: Three times a day (TID) | INTRAMUSCULAR | Status: AC
  Administered 2020-11-04 – 2020-11-05 (×2): 25 mg via INTRAVENOUS
  Filled 2020-11-04 (×2): qty 2

## 2020-11-04 MED ORDER — CLONAZEPAM 0.5 MG PO TBDP
1.0000 mg | ORAL_TABLET | Freq: Two times a day (BID) | ORAL | Status: DC
  Administered 2020-11-04 – 2020-11-09 (×10): 1 mg
  Filled 2020-11-04 (×11): qty 2

## 2020-11-04 MED ORDER — LACTATED RINGERS IV BOLUS
500.0000 mL | Freq: Once | INTRAVENOUS | Status: AC
  Administered 2020-11-05: 500 mL via INTRAVENOUS

## 2020-11-04 NOTE — Progress Notes (Signed)
Changed Dilaudid bag due to expiration of bag.  Wasted 45cc, verified by Sigurd Sos, RN.

## 2020-11-04 NOTE — Plan of Care (Signed)
  Problem: Education: Goal: Understanding of CV disease, CV risk reduction, and recovery process will improve Outcome: Progressing Goal: Individualized Educational Video(s) Outcome: Progressing   Problem: Activity: Goal: Ability to return to baseline activity level will improve Outcome: Progressing   Problem: Cardiovascular: Goal: Ability to achieve and maintain adequate cardiovascular perfusion will improve Outcome: Progressing Goal: Vascular access site(s) Level 0-1 will be maintained Outcome: Progressing   Problem: Education: Goal: Knowledge of General Education information will improve Description: Including pain rating scale, medication(s)/side effects and non-pharmacologic comfort measures Outcome: Progressing   Problem: Clinical Measurements: Goal: Ability to maintain clinical measurements within normal limits will improve Outcome: Progressing Goal: Will remain free from infection Outcome: Progressing Goal: Diagnostic test results will improve Outcome: Progressing Goal: Respiratory complications will improve Outcome: Progressing Goal: Cardiovascular complication will be avoided Outcome: Progressing   Problem: Activity: Goal: Risk for activity intolerance will decrease Outcome: Progressing   Problem: Nutrition: Goal: Adequate nutrition will be maintained Outcome: Progressing   Problem: Elimination: Goal: Will not experience complications related to bowel motility Outcome: Progressing   Problem: Coping: Goal: Level of anxiety will decrease Outcome: Progressing   Problem: Pain Managment: Goal: General experience of comfort will improve Outcome: Progressing   Problem: Safety: Goal: Ability to remain free from injury will improve Outcome: Progressing   Problem: Skin Integrity: Goal: Risk for impaired skin integrity will decrease Outcome: Progressing   Problem: Activity: Goal: Ability to tolerate increased activity will improve Outcome: Progressing    Problem: Respiratory: Goal: Ability to maintain a clear airway and adequate ventilation will improve Outcome: Progressing

## 2020-11-04 NOTE — Progress Notes (Deleted)
Anbx given

## 2020-11-04 NOTE — Progress Notes (Signed)
[place] for Heparin Indication: chest pain/ACS  Allergies  Allergen Reactions  . Ibuprofen Hives  . Lisinopril Swelling    PT states he is allergic to all prils; caused facial swelling  . Naproxen Hives and Other (See Comments)    Alleve causes patient to have hives    Patient Measurements: Height: 5\' 11"  (180.3 cm) Weight: 76 kg (167 lb 8.8 oz) IBW/kg (Calculated) : 75.3 Heparin Dosing Weight: 86.4 kg   Vital Signs: Temp: 97.2 F (36.2 C) (11/27 0900) Temp Source: Esophageal (11/27 0400) BP: 136/61 (11/27 0800) Pulse Rate: 92 (11/27 0900)  Labs: Recent Labs    11/01/20 2059 11/02/20 0301 11/02/20 0955 11/02/20 1551 11/02/20 2352 11/02/20 2352 11/03/20 0446 11/03/20 1540 11/04/20 0331 11/04/20 0429  HGB  --    < >  --   --  8.0*   < > 7.8*  --   --  7.4*  HCT  --    < >  --   --  24.0*  --  23.8*  --   --  22.5*  PLT  --    < >  --   --  355  --  339  --   --  304  HEPARINUNFRC 0.47  --  0.34  --   --   --  0.45  --   --   --   CREATININE  --    < >  --    < > 4.20*   < > 4.19* 4.06* 3.28*  --    < > = values in this interval not displayed.    Estimated Creatinine Clearance: 26.1 mL/min (A) (by C-G formula based on SCr of 3.28 mg/dL (H)).  Assessment: 44 yom presenting with STEMI. Known to have dilated ascending aorta, ESRD. Found to have 99% mid LAD with multivessel in LCx and RCA, now s/p DES to LAD. Heparin was started post sheath removal. Patient was found to have bacteremia and will not proceed with cath intervention at this time. Patient developed Afib during admission treated with amiodarone and successful cardioversion.   Heparin level subtherapeutic at 0.25 on drip rate 1400 units/hr. Confirmed with RN that heparin has not been turned off recently. No bleeding or infusion issues. Hgb stable in 7s, plts 300s. Remains on CRRT. Cangrelor was switched back to Brilinta yesterday.  Goal of Therapy:  Heparin level  0.3-0.7 units/mL  Monitor platelets by anticoagulation protocol: Yes   Plan:  Increase heparin infusion to 1500 units/hr. Check next HL with AM labs  Monitor daily HL, CBC, s/sx bleeding  Richardine Service, PharmD, BCPS PGY2 Cardiology Pharmacy Resident Phone: [phone] 11/04/2020  9:34 AM  Please check [email or web address] for unit-specific pharmacy phone numbers.

## 2020-11-04 NOTE — Progress Notes (Signed)
Progress Note  Patient Name: Travis [name] Date of Encounter: 11/04/2020  [facility] Cardiologist: Glenetta Hew, MD   Subjective   Sedated, becomes easily agitated. Wife at bedside. On CRRT. Off Norepi, still on vasopressin.  Inpatient Medications    Scheduled Meds: . sodium chloride   Intravenous Once  . amiodarone  400 mg Per Tube BID  . aspirin  150 mg Rectal Daily  . atorvastatin  80 mg Per Tube q1800  . B-complex with vitamin C  1 tablet Oral Daily  . chlorhexidine gluconate (MEDLINE KIT)  15 mL Mouth Rinse BID  . Chlorhexidine Gluconate Cloth  6 each Topical Q0600  . docusate  100 mg Per Tube BID  . [START ON 11/06/2020] doxercalciferol  5 mcg Intravenous Q M,W,F  . feeding supplement (PROSource TF)  45 mL Per Tube QID  . hydrocortisone sod succinate (SOLU-CORTEF) inj  50 mg Intravenous Q8H  . lidocaine  1 patch Transdermal Q24H  . mouth rinse  15 mL Mouth Rinse 10 times per day  . polyethylene glycol  17 g Per Tube Daily  . QUEtiapine  50 mg Per Tube BID  . sevelamer carbonate  0.8 g Per Tube TID  . sodium chloride flush  10-40 mL Intracatheter Q12H  . sodium chloride flush  3 mL Intravenous Q12H  . Thrombi-Pad  1 each Topical Once  . ticagrelor  90 mg Per Tube BID   Continuous Infusions: .  prismasol BGK 4/2.5 400 mL/hr at 11/04/20 0219  .  prismasol BGK 4/2.5 200 mL/hr at 11/03/20 1940  . sodium chloride Stopped (11/03/20 2145)  . sodium chloride Stopped (10/28/20 0954)  . sodium chloride Stopped (11/04/20 0016)  .  ceFAZolin (ANCEF) IV Stopped (11/03/20 2038)  . famotidine (PEPCID) IV Stopped (11/03/20 1009)  . feeding supplement (VITAL AF 1.2 CAL) 40 mL/hr at 11/04/20 0300  . heparin 1,400 Units/hr (11/04/20 0825)  . HYDROmorphone 1 mg/hr (11/04/20 0606)  . nitroGLYCERIN Stopped (10/28/20 2100)  . norepinephrine (LEVOPHED) Adult infusion Stopped (11/03/20 2322)  . prismasol BGK 2/2.5 dialysis solution 2,000 mL/hr at 11/04/20 0727  . propofol  (DIPRIVAN) infusion 45 mcg/kg/min (11/04/20 0606)  . vasopressin 0.03 Units/min (11/04/20 0606)   PRN Meds: sodium chloride, acetaminophen, heparin, HYDROmorphone, HYDROmorphone (DILAUDID) injection, HYDROmorphone (DILAUDID) injection, LORazepam, midazolam, nitroGLYCERIN, ondansetron (ZOFRAN) IV, sodium chloride, sodium chloride flush, sodium chloride flush   Vital Signs    Vitals:   11/04/20 0615 11/04/20 0630 11/04/20 0645 11/04/20 0738  BP:    128/61  Pulse: 78 78 77 83  Resp: 15 15 15 18   Temp: (!) 97 F (36.1 C) (!) 96.8 F (36 C) (!) 97 F (36.1 C)   TempSrc:      SpO2: 94% 95% 96% 94%  Weight:      Height:        Intake/Output Summary (Last 24 hours) at 11/04/2020 0839 Last data filed at 11/04/2020 0606 Gross per 24 hour  Intake 2926.16 ml  Output 3887 ml  Net -960.84 ml   Last 3 Weights 11/04/2020 11/03/2020 11/02/2020  Weight (lbs) 167 lb 8.8 oz 175 lb 14.8 oz 179 lb 7.3 oz  Weight (kg) 76 kg 79.8 kg 81.4 kg      Telemetry    NSR - Personally Reviewed  ECG    NSR, 1st deg AV block, QS V1-V2, ST depression and T wave inversion V5-V6 - Personally Reviewed  Physical Exam  Sedated, becomes easily agitated. On CRRT GEN: No acute distress.  Neck: No JVD Cardiac: RRR, no diastolic murmurs, rubs, or gallops. 2/6 apical holosystolic murmur. LUE AV fistula. Respiratory: Clear to auscultation bilaterally except scattered rhonchi. GI: Soft, nontender, non-distended  MS: No edema; No deformity. Neuro:  Nonfocal  Psych: Normal affect   Labs    High Sensitivity Troponin:   Recent Labs  Lab 10/28/20 0629 10/28/20 1046 10/28/20 2040 10/29/20 2121 10/30/20 0548  TROPONINIHS 3,155* >27,000* >27,000* >27,000* >27,000*      Chemistry Recent Labs  Lab 10/30/20 0548 10/31/20 0830 11/03/20 0446 11/03/20 1540 11/04/20 0331  NA 125*   < > 134* 135 135  K 5.2*   < > 5.0 4.4 4.0  CL 86*   < > 96* 98 97*  CO2 22   < > 23 23 25   GLUCOSE 148*   < > 121* 126*  118*  BUN 75*   < > 34* 35* 29*  CREATININE 11.18*   < > 4.19* 4.06* 3.28*  CALCIUM 9.1   < > 9.8 9.4 9.5  PROT 5.6*  --   --   --   --   ALBUMIN 2.2*   < > 1.7* 1.6* 1.6*  AST 87*  --   --   --   --   ALT 51*  --   --   --   --   ALKPHOS 95  --   --   --   --   BILITOT 1.5*  --   --   --   --   GFRNONAA 5*   < > 16* 16* 21*  ANIONGAP 17*   < > 15 14 13    < > = values in this interval not displayed.     Hematology Recent Labs  Lab 11/02/20 2352 11/03/20 0446 11/04/20 0429  WBC 37.5* 37.3* 30.5*  RBC 2.46* 2.44* 2.27*  HGB 8.0* 7.8* 7.4*  HCT 24.0* 23.8* 22.5*  MCV 97.6 97.5 99.1  MCH 32.5 32.0 32.6  MCHC 33.3 32.8 32.9  RDW 16.4* 16.5* 17.4*  PLT 355 339 304    BNPNo results for input(s): BNP, PROBNP in the last 168 hours.   DDimer No results for input(s): DDIMER in the last 168 hours.   Radiology    DG Abd 1 View  Result Date: 11/03/2020 CLINICAL DATA:  Feeding tube adjustment EXAM: ABDOMEN - 1 VIEW COMPARISON:  11/01/2020 FINDINGS: Feeding tube is in place. Contrast was injected through the feeding tube which demonstrates the tip in the duodenal bulb. IMPRESSION: Feeding tube tip in the proximal duodenum. Electronically Signed   By: Rolm Baptise M.D.   On: 11/03/2020 12:58   DG CHEST PORT 1 VIEW  Result Date: 11/04/2020 CLINICAL DATA:  Code stemi. Encounter for routine chest x-ray per order. EXAM: PORTABLE CHEST 1 VIEW COMPARISON:  11/03/2020 FINDINGS: An enteric tube is been placed. The tip is off the field of view but below the left hemidiaphragm. Endotracheal tube and right central venous catheter are unchanged in position. Cardiac enlargement. Shallow inspiration with atelectasis or infiltration in the lung bases. Probable small right pleural effusion. No significant change. No pneumothorax. Mediastinal contours appear intact. IMPRESSION: Appliances appear in satisfactory position. Cardiac enlargement. Shallow inspiration with atelectasis or infiltration in the  lung bases and small right pleural effusion. Electronically Signed   By: Lucienne Capers M.D.   On: 11/04/2020 06:23   DG CHEST PORT 1 VIEW  Result Date: 11/03/2020 CLINICAL DATA:  Respiratory failure.  History of hypertension. EXAM: PORTABLE CHEST 1  VIEW COMPARISON:  11/01/2020 FINDINGS: Endotracheal tube and right central venous catheter are unchanged in position. Cardiac enlargement. Bilateral basilar atelectasis or infiltration with some improvement in the right base since prior study. Probable small right pleural effusion. IMPRESSION: Bilateral basilar atelectasis or infiltration with some improvement in the right base since prior study. Electronically Signed   By: Lucienne Capers M.D.   On: 11/03/2020 06:05    Cardiac Studies    Cath PCI 11/20: Acute anterolateral ST elevation MI with Culprit lesion being the 99% mid LAD lesion (just past major 1st Diag/D1) along with multivessel CAD including 99% ostial AV groove LCx and 80% calcified napkin ring proximal RCA lesion with extensive calcification throughout the RCA. ? Successful PTCA and DES PCI of the LAD crossing D1 -resolute Onyx DES 2.75 mm x 18 mm postdilated to 3.1 mm.  Mild to moderate reduced EF with anterior anterolateral hypokinesis, EF roughly 40 to 45%.  Initial evaluation suggested normal EDP, but on recheck post PCI LVEDP of 30 mmHg, severely elevated consistent with ACUTE DIASTOLIC HEART FAILURE  Significantly dilated aortic root requiring AL-1 guide catheter for both Left and Right Coronary Angiography    TTE 10/31/2020: EF 35 to 40%. Moderate decreased function. Moderate concentric LVH GR 3 DD. Severe HK of the mid apical inferolateral and inferior wall with moderate HK of the apical septal and anterior wall. PA pressure estimated 43 mmHg. Severe LA dilation. Moderate RA dilation. Moderate to severe MR. Aortic root dilated at 41 mm. RV PSP 15 mmHg.   LFemV Central line placed by [facility] 11/21    TEE 11/01/2020: EF  35-40% moderate decreased function. Severe LA dilation. No LAA thrombus. Moderate RA dilation. 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 Mild dilation of ascending aorta measuring 40 mm. Moderate grade 3 plaque..  Patient Profile     59 y.o. male  with ESRD on HD, ascending Ao aneurysm, admitted with anterior STEMI 11/20, s/p PCI-DES of LAD (with residual disease in his right coronary and AVG-LCx), acute systolic HF with pulmonary edema, 3-4+ eccentric MR, septic shock with MSSA bacteremia and RLL pneumonia, recurrent paroxysmal AFib s/p DCCV 11/23, PEA arrest 11/24 with rapid resuscitation (possibly sedation related)  Assessment & Plan    1. CAD s/p ant STEMI: complicated by acute HF and pulmonary edema. Has residual multivessel disease and will require PCI to RCA and LCX, but currently too unstable. Currently on Brilinta + heparin IV, long term plan for warfarin +plavix. 2. Acute HF: due to low EF and severe MR, managed w CRRT. Oxygenating well. Mild vascular congestion and small pleural effusion on today CXR. 3. Septic shock/MSSA bacteremia/pneumonia: improved fairly rapidly w ABx. Now on vasopressin only, Norepi stopped. No clear proof of endocarditis. No vegetations on TEE, but multifocal pneumonia with cavitary lesion suggest septic embolism. Source uncertain. Suspect 4-6 weeks of IV abx regardless of firm diagnosis. On stress dose hydrocortisone. 4. Moderate-to-severe MR: TEE suggests secondary (ischemic) mechanism. Might improve after LCX PCI? Currently not candidate for PCI or for surgical repair. On my review of TEE 11/24, probably only moderate MR and clearly substantially improved from TTE on 11/23. Reevaluate TTE when hemodynamically stabilized and off vent. 5. Parox AFib: new onset. Currently NSR on hi-dose amiodarone load. High risk of  arrhythmia recurrence with acute critical illness and severe MR. On anticoagulation. CHADSVasc at least 3 (HF, CAD, HTN). 6. Ac resp  failure/hypoxia: due to Staph pneumonia and CHF. Premature to wean vent. 7. ESRD on chronic HD/now CRRT: BP improved, will probably soon tolerate transition back to intermittent HD. 8. Anemia: multifactorial, primarily due to ESRD and Fe deficiency. May be a persistent problem since he will need long term combined anticoagulation and antiplatelet therapy.   CRITICAL CARE Performed by: Dani Gobble Inda Mcglothen   Total critical care time: 50 minutes  Critical care time was exclusive of separately billable procedures and treating other patients.  Critical care was necessary to treat or prevent imminent or life-threatening deterioration.  Critical care was time spent personally by me on the following activities: development of treatment plan with patient and/or surrogate as well as nursing, discussions with consultants, evaluation of patient's response to treatment, examination of patient, obtaining history from patient or surrogate, ordering and performing treatments and interventions, ordering and review of laboratory studies, ordering and review of radiographic studies, pulse oximetry and re-evaluation of patient's condition.  . For questions or updates, please contact [place] Please consult [email or web address] for contact info under        Signed, Sanda Klein, MD  11/04/2020, 8:39 AM

## 2020-11-04 NOTE — Progress Notes (Signed)
[facility] Progress Note   Assessment/ Plan:   OP HD:AF MWF 4h 450/800 81.5kg 2/2.25 bath RUE AVF Hep 5000+ 107mdrun - hect 5 ug tiw  - mircera 50 ug q 4, last 11/15 (due 11/29)    Assessment/ Plan: 1. Acute STEMI - SP LAD PCI 11/20 am, has other disease in LCx/ RCA. On Cangrelor. Per cardiology. 2. Acute hypoxic RF and cavitary pneumonia - had pulm edema by CXR w/ SOB / coughing. Initially better but had more tachypnea and coughing, had CT chest  11/23 with multifocal PNA and likely cavitary lesion.  Intubated 11/23.  Per PCCM.  Respiratory culture 11/23 with MSSA 3. PEA arrest: occurred in setting of agitation/ sedation.  ROSC with 1 mg epi/ CPR/ 1 min. 4. MSSA bacteremia- cultures initially positive 11/21.  on cefazolin,  Cultures 11/22, 11/23, and 11/24 all negative, plan for 6 weeks of IV cefazolin per pharmacy.  CT abd/ pelvis negative 11/23.  Dialysis duplex 11/22 without abscess- was getting dialysis at the time of the study so will d/w PVL lab to make sure study was adequate--> will do an [place] AVF as well.   5. ESRD - usual HD MWF. Had LHC w/ high LVEDP +35 mmHg. Lowering vol w/ HD as tolerated. Started CRRT 11/24, 4K pre and post, 2K dialysate, no heparin d/t hep gtt.  Pull a little more fluid, 50-100 mL/ hr  6. Shock: presumably septic+/- cardiogenic, pressors and stress dose steroids per PCCM 7. 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 Afib: on amio and hep gtt, s/p DCCV 10. Severe MR: per cardiology  Subjective:    Off levophed.     Objective:   BP 136/61 (BP Location: Other (Comment)) Comment (BP Location): R Radial A-line   Pulse 95   Temp (!) 96.8 F (36 C)   Resp 14   Ht _0  (1.803 m)   Wt 76 kg   SpO2 95%   BMI 23.37 kg/m   Physical Exam: GWLK:HVFMBin bed, intubated, sedated CBUY:[id number] no m/r/g Resp: tachypneic Abd: soft Ext: no gross  edema ACCESS: LUE AVF + T/B  Labs: BMET Recent Labs  Lab 11/01/20 1514 11/01/20 1514 11/01/20 2048 11/02/20 0301 11/02/20 1551 11/02/20 2352 11/03/20 0446 11/03/20 1532 11/03/20 1540 11/04/20 0331  NA 138   < > 133* 136 135 134* 134*  --  135 135  K 5.4*   < > 5.1 4.9 4.8 5.0 5.0  --  4.4 4.0  CL 92*   < > 95* 98 97* 96* 96*  --  98 97*  CO2 24   < > 20* _1 --  23 25  GLUCOSE 88   < > 103* 99 101* 116* 121*  --  126* 118*  BUN 88*   < > 75* 60* 39* 36* 34*  --  35* 29*  CREATININE 10.91*   < > 8.15* 6.78* 5.16* 4.20* 4.19*  --  4.06* 3.28*  CALCIUM 9.0   < > 8.9 9.1 8.9 9.4 9.8  --  9.4 9.5  PHOS 7.1*  --   --  5.8* 5.8*  --  6.3* 6.0* 6.0* 4.6   < > = values in this interval not displayed.   CBC Recent Labs  Lab 10/29/20 2121 10/29/20 2128 11/02/20 0301 11/02/20 2352 11/03/20 0446 11/04/20 0429  WBC 12.9*   < > 27.9*  37.5* 37.3* 30.5*  NEUTROABS 11.8*  --   --   --   --   --   HGB 6.0*   < > 7.5* 8.0* 7.8* 7.4*  HCT 18.8*   < > 22.0* 24.0* 23.8* 22.5*  MCV 93.5   < > 95.7 97.6 97.5 99.1  PLT 86*   < > 295 355 339 304   < > = values in this interval not displayed.      Medications:    . sodium chloride   Intravenous Once  . amiodarone  400 mg Per Tube BID  . aspirin  150 mg Rectal Daily  . atorvastatin  80 mg Per Tube q1800  . B-complex with vitamin C  1 tablet Oral Daily  . chlorhexidine gluconate (MEDLINE KIT)  15 mL Mouth Rinse BID  . Chlorhexidine Gluconate Cloth  6 each Topical Q0600  . docusate  100 mg Per Tube BID  . [START ON 11/06/2020] doxercalciferol  5 mcg Intravenous Q M,W,F  . feeding supplement (PROSource TF)  45 mL Per Tube QID  . hydrocortisone sod succinate (SOLU-CORTEF) inj  50 mg Intravenous Q8H  . lidocaine  1 patch Transdermal Q24H  . mouth rinse  15 mL Mouth Rinse 10 times per day  . polyethylene glycol  17 g Per Tube Daily  . QUEtiapine  50 mg Per Tube BID  . sevelamer carbonate  0.8 g Per Tube TID  . sodium chloride  flush  10-40 mL Intracatheter Q12H  . sodium chloride flush  3 mL Intravenous Q12H  . Thrombi-Pad  1 each Topical Once  . ticagrelor  90 mg Per Tube BID     Madelon Lips MD 11/04/2020, 11:19 AM

## 2020-11-04 NOTE — Plan of Care (Signed)
  Problem: Education: Goal: Understanding of CV disease, CV risk reduction, and recovery process will improve Outcome: Progressing Goal: Individualized Educational Video(s) Outcome: Progressing   Problem: Activity: Goal: Ability to return to baseline activity level will improve Outcome: Progressing   Problem: Cardiovascular: Goal: Ability to achieve and maintain adequate cardiovascular perfusion will improve Outcome: Progressing Goal: Vascular access site(s) Level 0-1 will be maintained Outcome: Progressing   Problem: Health Behavior/Discharge Planning: Goal: Ability to safely manage health-related needs after discharge will improve Outcome: Progressing   Problem: Education: Goal: Knowledge of General Education information will improve Description: Including pain rating scale, medication(s)/side effects and non-pharmacologic comfort measures Outcome: Progressing   Problem: Health Behavior/Discharge Planning: Goal: Ability to manage health-related needs will improve Outcome: Progressing   Problem: Clinical Measurements: Goal: Ability to maintain clinical measurements within normal limits will improve Outcome: Progressing Goal: Will remain free from infection Outcome: Progressing Goal: Diagnostic test results will improve Outcome: Progressing Goal: Respiratory complications will improve Outcome: Progressing Goal: Cardiovascular complication will be avoided Outcome: Progressing   Problem: Activity: Goal: Risk for activity intolerance will decrease Outcome: Progressing   Problem: Nutrition: Goal: Adequate nutrition will be maintained Outcome: Progressing   Problem: Coping: Goal: Level of anxiety will decrease Outcome: Progressing   Problem: Elimination: Goal: Will not experience complications related to bowel motility Outcome: Progressing   Problem: Pain Managment: Goal: General experience of comfort will improve Outcome: Progressing   Problem: Safety: Goal:  Ability to remain free from injury will improve Outcome: Progressing   Problem: Skin Integrity: Goal: Risk for impaired skin integrity will decrease Outcome: Progressing   Problem: Activity: Goal: Ability to tolerate increased activity will improve Outcome: Progressing   Problem: Respiratory: Goal: Ability to maintain a clear airway and adequate ventilation will improve Outcome: Progressing   Problem: Role Relationship: Goal: Method of communication will improve Outcome: Progressing

## 2020-11-04 NOTE — Progress Notes (Signed)
NAME:  Travis [name], MRN:  [id number], DOB:  24-Aug-1961, LOS: 7 ADMISSION DATE:  10/28/2020, CONSULTATION DATE:  10/29/20 REFERRING MD:  Cardiology - [name], CHIEF COMPLAINT:  Hypotension, gram positive cocci on culture, AMS  Brief History   Travis [name] is a 59 year old man with a history of HTN, ESRD on HD MWF, RUE AV fistula, hx of smoking, admitted 10/28/20 with fevers, myalgias for several days, acute chest pain.    History of present illness   On Admission, found to have STEMI, multivessel disease. Pulm edema with mild hypoxemia.  S/Post PCI to the LAD on 10/29/19. Planning for staged intervention to the left circumflex and possibly right coronary artery.  EF 91-50%, diastolic dysfunction, LVEDP 35. Underwent HD on 11/20 with 4 L volume removed.  Saturation improved after dialysis (100% on RA) Fevers noted 11/20. Blood cultures grew MSSA, noted this evening. .    This evening developed Afib with RVR 180s. Adenosine given, without improvement  Amiodarone infusion started per cardiology, 150mg  bolus, then drip.  Lopressor 5mg IV and 500ccNS given. Started on Phenylephrine, was on 243mcg on my arrival.   Cardioverted emergently at 120J for ongoing afib with hypotension (60/40) and AMS.  Converted to sinus.  Remained moderately hypotensive (MAP 60) on phenylephrine.   Patient was given one dose zosyn, switched to ancef once cultures grew back MSSA.   Cardiac stress tests annually, last done 11/16/19.    Past Medical History  HTN ESRD, HD MWF  Significant Hospital Events   Cardioversion 11/21 Cardiac cath stent to LAD 11/20  Consults:  PCCM  Nephrology  Procedures:  Central line 11/21  Arterial line 11/21 Repeat DCCV 11/23 HD line 11/24 Arterial line 11/24  Significant Diagnostic Tests:  11/23 Echo Pending 11/22 LUE Vas Upper Extremity Doppler>> Arteriovenous fistula-Aneurysmal dilatation noted. 11/24 CT abdomen - no retroperitoneal hematoma 11/24 transthoracic  echocardiogram-severe LV dysfunction EF 35% with restrictive diastolic function severe inferior wall motion abnormalities.  Moderate to severe mitral regurgitation. Micro Data:   11/21 Staph aureus > MSSA by BCID >>> 11/22 Cultures negative. Antimicrobials:  Zosyn 11/21 x 1 Ancef 11/21 ->  Interim history/subjective:   Decreasing vasopressor requirements.  Tolerating sedation wean.  Tolerating pressure support ventilation  Objective   Blood pressure (!) 162/67, pulse 94, temperature (!) 97.5 F (36.4 C), resp. rate 15, height 5\' 11"  (1.803 m), weight 76 kg, SpO2 93 %.    Vent Mode: PRVC FiO2 (%):  [40 %] 40 % Set Rate:  [15 bmp] 15 bmp Vt Set:  [600 mL] 600 mL PEEP:  [5 cmH20] 5 cmH20 Pressure Support:  [5 VWP79-4 cmH20] 5 cmH20 Plateau Pressure:  [13 cmH20-16 cmH20] 16 cmH20   Intake/Output Summary (Last 24 hours) at 11/04/2020 1508 Last data filed at 11/04/2020 1500 Gross per 24 hour  Intake 3103.26 ml  Output 4669 ml  Net -1565.74 ml   Filed Weights   11/02/20 0500 11/03/20 0500 11/04/20 0500  Weight: 81.4 kg 79.8 kg 76 kg    Examination:  General: middle aged adult male, intubated and sedated.  Average build HENT: ETT tube in place. Minimal secretions. Cortrak in place, Lungs: no ventilator dyssynchrony. Normal vesicular breath sounds throughout. Cardiovascular: in sinus rhythm with warm extremities. Capillary refill 3s.  Heart sounds are distant but unremarkable Abdomen: soft with no distention.  Extremities: no acute deformity or ROM limitation. No edema.  Left upper extremity fistula. Neuro: sedated with. Will cough and move with stimulation.   [facility]  Problem list     Assessment & Plan:   Critically ill due to severe sepsis with septic shock due to MSSA bacteremia requiring titration of vasopressors. -Norepinephrine is off and weaning vasopressin to keep MAP greater than 65 -Much of the vasopressor requirement at this time may be related to  sedation. -Start to wean stress steroids.  Critically ill due to acute hypoxic respiratory failure due to MSSA pneumonia with cavitation. -Continue full ventilatory support -Chest physiotherapy -Have initiated SBT's.  -Mental status precludes extubation at this time  Critically ill due to acute toxic metabolic encephalopathy with agitation requiring titration of IV sedatives. -Seroquel initiation appears to have facilitated sedation weaning -Continue to wean sedatives starting with propofol.  MSSA bacteremia likely from HD access.  No evidence of fistula or abscess.  Or endocarditis on TEE -Complete 6 weeks of IV cefazolin  Afib now cardioverted to sinus rhythm on amiodarone following DCCV - Cont amiodarone - will convert to enteral.   STEMI/CAD. On Heparin, brillinta, asa.   - Will eventually need a return to cath lab for staged PCI  Hyponatremia - nephrology following, last HD 11/22 - Trend BMET  ESRD:  -Continue CRRT -Start fluid removal net -50 mL/h.   Best practice:  Diet: Initiate tube feeding Pain/Anxiety/Delirium protocol (if indicated): Propofol and hydromorphone infusions to RASS -1, have added lorazepam IV as needed to minimize propofol use. VAP protocol (if indicated): Bundle in place DVT prophylaxis: on heparin gtt GI prophylaxis: famotidine Glucose control: Phase 1 glycemic control Mobility: Bedrest Code Status: Full  Family Communication: Spoke with wife  at bedside 11/24 Disposition: ICU  Labs   CBC: Recent Labs  Lab 10/29/20 2121 10/29/20 2128 11/01/20 2045 11/02/20 0301 11/02/20 2352 11/03/20 0446 11/04/20 0429  WBC 12.9*   < > 28.1* 27.9* 37.5* 37.3* 30.5*  NEUTROABS 11.8*  --   --   --   --   --   --   HGB 6.0*   < > 7.6* 7.5* 8.0* 7.8* 7.4*  HCT 18.8*   < > 21.6* 22.0* 24.0* 23.8* 22.5*  MCV 93.5   < > 92.7 95.7 97.6 97.5 99.1  PLT 86*   < > 291 295 355 339 304   < > = values in this interval not displayed.    Basic Metabolic  Panel: Recent Labs  Lab 11/02/20 0301 11/02/20 0301 11/02/20 1551 11/02/20 2352 11/03/20 0446 11/03/20 1532 11/03/20 1540 11/04/20 0331  NA 136   < > 135 134* 134*  --  135 135  K 4.9   < > 4.8 5.0 5.0  --  4.4 4.0  CL 98   < > 97* 96* 96*  --  98 97*  CO2 23   < > 23 23 23   --  23 25  GLUCOSE 99   < > 101* 116* 121*  --  126* 118*  BUN 60*   < > 39* 36* 34*  --  35* 29*  CREATININE 6.78*   < > 5.16* 4.20* 4.19*  --  4.06* 3.28*  CALCIUM 9.1   < > 8.9 9.4 9.8  --  9.4 9.5  MG 2.8*  --   --  3.0* 3.0* 3.0*  --  2.9*  PHOS 5.8*   < > 5.8*  --  6.3* 6.0* 6.0* 4.6   < > = values in this interval not displayed.   GFR: Estimated Creatinine Clearance: 26.1 mL/min (A) (by C-G formula based on SCr of 3.28 mg/dL (H)).  Recent Labs  Lab 10/30/20 0820 10/31/20 0030 10/31/20 1327 11/01/20 0412 11/01/20 0430 11/01/20 0625 11/01/20 2045 11/02/20 0301 11/02/20 2352 11/03/20 0446 11/04/20 0429  WBC  --    < >  --   --    < >  --    < > 27.9* 37.5* 37.3* 30.5*  LATICACIDVEN 2.1*  --  2.5* 5.5*  --  2.1*  --   --   --   --   --    < > = values in this interval not displayed.    Liver Function Tests: Recent Labs  Lab 10/30/20 0548 10/31/20 0830 11/02/20 0301 11/02/20 1551 11/03/20 0446 11/03/20 1540 11/04/20 0331  AST 87*  --   --   --   --   --   --   ALT 51*  --   --   --   --   --   --   ALKPHOS 95  --   --   --   --   --   --   BILITOT 1.5*  --   --   --   --   --   --   PROT 5.6*  --   --   --   --   --   --   ALBUMIN 2.2*   < > 1.6* 1.7* 1.7* 1.6* 1.6*   < > = values in this interval not displayed.   No results for input(s): LIPASE, AMYLASE in the last 168 hours. No results for input(s): AMMONIA in the last 168 hours.  ABG    Component Value Date/Time   PHART 7.295 (L) 11/01/2020 0336   PCO2ART 39.9 11/01/2020 0336   PO2ART 71 (L) 11/01/2020 0336   HCO3 19.4 (L) 11/01/2020 0336   TCO2 21 (L) 11/01/2020 0336   ACIDBASEDEF 7.0 (H) 11/01/2020 0336   O2SAT 82.0  11/01/2020 1130     Coagulation Profile: Recent Labs  Lab 10/30/20 0548  INR 1.2    Cardiac Enzymes: No results for input(s): CKTOTAL, CKMB, CKMBINDEX, TROPONINI in the last 168 hours.  HbA1C: Hgb A1c MFr Bld  Date/Time Value Ref Range Status  10/28/2020 06:04 AM 4.3 (L) 4.8 - 5.6 % Final    Comment:    (NOTE) Pre diabetes:          5.7%-6.4%  Diabetes:              >6.4%  Glycemic control for   <7.0% adults with diabetes   01/30/2015 09:09 PM 5.1 4.8 - 5.6 % Final    Comment:    (NOTE)         Pre-diabetes: 5.7 - 6.4         Diabetes: >6.4         Glycemic control for adults with diabetes: <7.0     CBG: Recent Labs  Lab 11/03/20 2037 11/04/20 0050 11/04/20 0441 11/04/20 0835 11/04/20 1203  GLUCAP 129* 109* 123* 138* 127*    Critical care time: 40 minutes     Kipp Brood, MD [facility] ICU Physician [place]  Pager: [phone] Or Epic Secure Chat After hours: [phone].  11/04/2020, 3:08 PM

## 2020-11-04 NOTE — Progress Notes (Signed)
[place] Progress Note Patient Name: Travis [name] DOB: [birth year] MRN: [id number]   Date of Service  11/04/2020  HPI/Events of Note  Called for Afib to 130-140bpm in setting of hypotension requiring initiation of levophed at 0.19 mcg/kg/min. Propofol @ 10 recently reinitiated to control agitation so may be contributing. Also on CRRT with UF such that he is net negative ~100cc/hr, which may also be a factor.   eICU Interventions  Given pressor requirement, will start by giving back LR 500cc fluid and targeting net even I/O by adjusting UF rate to see if this reduces levophed rate / improves BP and also improves Afib rate. If not, will consider amio loading. He is already on amio PO.     Intervention Category Major Interventions: Hypotension - evaluation and management;Arrhythmia - evaluation and management  Marily Lente Miyuki Rzasa 11/04/2020, 11:56 PM

## 2020-11-05 DIAGNOSIS — I2109 ST elevation (STEMI) myocardial infarction involving other coronary artery of anterior wall: Secondary | ICD-10-CM | POA: Diagnosis not present

## 2020-11-05 DIAGNOSIS — I4891 Unspecified atrial fibrillation: Secondary | ICD-10-CM | POA: Diagnosis not present

## 2020-11-05 DIAGNOSIS — I5041 Acute combined systolic (congestive) and diastolic (congestive) heart failure: Secondary | ICD-10-CM | POA: Diagnosis not present

## 2020-11-05 DIAGNOSIS — I469 Cardiac arrest, cause unspecified: Secondary | ICD-10-CM | POA: Diagnosis not present

## 2020-11-05 LAB — BASIC METABOLIC PANEL
Anion gap: 15 (ref 5–15)
BUN: 45 mg/dL — ABNORMAL HIGH (ref 6–20)
CO2: 24 mmol/L (ref 22–32)
Calcium: 8.9 mg/dL (ref 8.9–10.3)
Chloride: 96 mmol/L — ABNORMAL LOW (ref 98–111)
Creatinine, Ser: 2.85 mg/dL — ABNORMAL HIGH (ref 0.61–1.24)
GFR, Estimated: 25 mL/min — ABNORMAL LOW (ref >60)
Glucose, Bld: 151 mg/dL — ABNORMAL HIGH (ref 70–99)
Potassium: 3.6 mmol/L (ref 3.5–5.1)
Sodium: 135 mmol/L (ref 135–145)

## 2020-11-05 LAB — RENAL FUNCTION PANEL
Albumin: 1.6 g/dL — ABNORMAL LOW (ref 3.5–5.0)
Albumin: 1.6 g/dL — ABNORMAL LOW (ref 3.5–5.0)
Anion gap: 16 — ABNORMAL HIGH (ref 5–15)
Anion gap: 13 (ref 5–15)
BUN: 42 mg/dL — ABNORMAL HIGH (ref 6–20)
BUN: 50 mg/dL — ABNORMAL HIGH (ref 6–20)
CO2: 25 mmol/L (ref 22–32)
CO2: 24 mmol/L (ref 22–32)
Calcium: 9.2 mg/dL (ref 8.9–10.3)
Calcium: 9 mg/dL (ref 8.9–10.3)
Chloride: 93 mmol/L — ABNORMAL LOW (ref 98–111)
Chloride: 98 mmol/L (ref 98–111)
Creatinine, Ser: 2.84 mg/dL — ABNORMAL HIGH (ref 0.61–1.24)
Creatinine, Ser: 2.84 mg/dL — ABNORMAL HIGH (ref 0.61–1.24)
GFR, Estimated: 25 mL/min — ABNORMAL LOW (ref >60)
GFR, Estimated: 25 mL/min — ABNORMAL LOW (ref >60)
Glucose, Bld: 146 mg/dL — ABNORMAL HIGH (ref 70–99)
Glucose, Bld: 133 mg/dL — ABNORMAL HIGH (ref 70–99)
Phosphorus: 2.7 mg/dL (ref 2.5–4.6)
Phosphorus: 2.9 mg/dL (ref 2.5–4.6)
Potassium: 3.2 mmol/L — ABNORMAL LOW (ref 3.5–5.1)
Potassium: 4.1 mmol/L (ref 3.5–5.1)
Sodium: 134 mmol/L — ABNORMAL LOW (ref 135–145)
Sodium: 135 mmol/L (ref 135–145)

## 2020-11-05 LAB — CULTURE, BLOOD (ROUTINE X 2)
Culture: NO GROWTH
Culture: NO GROWTH
Special Requests: ADEQUATE
Special Requests: ADEQUATE

## 2020-11-05 LAB — HEPARIN LEVEL (UNFRACTIONATED)
Heparin Unfractionated: 0.21 IU/mL — ABNORMAL LOW (ref 0.30–0.70)
Heparin Unfractionated: 0.24 IU/mL — ABNORMAL LOW (ref 0.30–0.70)

## 2020-11-05 LAB — GLUCOSE, CAPILLARY
Glucose-Capillary: 124 mg/dL — ABNORMAL HIGH (ref 70–99)
Glucose-Capillary: 127 mg/dL — ABNORMAL HIGH (ref 70–99)
Glucose-Capillary: 132 mg/dL — ABNORMAL HIGH (ref 70–99)
Glucose-Capillary: 135 mg/dL — ABNORMAL HIGH (ref 70–99)
Glucose-Capillary: 137 mg/dL — ABNORMAL HIGH (ref 70–99)
Glucose-Capillary: 146 mg/dL — ABNORMAL HIGH (ref 70–99)

## 2020-11-05 LAB — CBC
HCT: 25.5 % — ABNORMAL LOW (ref 39.0–52.0)
Hemoglobin: 8.1 g/dL — ABNORMAL LOW (ref 13.0–17.0)
MCH: 31.4 pg (ref 26.0–34.0)
MCHC: 31.8 g/dL (ref 30.0–36.0)
MCV: 98.8 fL (ref 80.0–100.0)
Platelets: 377 10*3/uL (ref 150–400)
RBC: 2.58 MIL/uL — ABNORMAL LOW (ref 4.22–5.81)
RDW: 19.6 % — ABNORMAL HIGH (ref 11.5–15.5)
WBC: 40.3 10*3/uL — ABNORMAL HIGH (ref 4.0–10.5)
nRBC: 3.3 % — ABNORMAL HIGH (ref 0.0–0.2)

## 2020-11-05 LAB — MAGNESIUM: Magnesium: 2.8 mg/dL — ABNORMAL HIGH (ref 1.7–2.4)

## 2020-11-05 LAB — PHOSPHORUS: Phosphorus: 2.7 mg/dL (ref 2.5–4.6)

## 2020-11-05 MED ORDER — POTASSIUM CHLORIDE 10 MEQ/50ML IV SOLN
10.0000 meq | INTRAVENOUS | Status: DC
  Administered 2020-11-05: 10 meq via INTRAVENOUS
  Filled 2020-11-05 (×2): qty 50

## 2020-11-05 MED ORDER — INSULIN ASPART 100 UNIT/ML ~~LOC~~ SOLN
0.0000 [IU] | SUBCUTANEOUS | Status: DC
  Administered 2020-11-08: 1 [IU] via SUBCUTANEOUS

## 2020-11-05 MED ORDER — ASPIRIN 81 MG PO CHEW
81.0000 mg | CHEWABLE_TABLET | Freq: Every day | ORAL | Status: DC
  Administered 2020-11-06 – 2020-11-09 (×4): 81 mg
  Filled 2020-11-05 (×4): qty 1

## 2020-11-05 MED ORDER — PRISMASOL BGK 4/2.5 32-4-2.5 MEQ/L EC SOLN: Status: DC

## 2020-11-05 MED ORDER — B COMPLEX-C PO TABS
1.0000 | ORAL_TABLET | Freq: Every day | ORAL | Status: DC
  Administered 2020-11-05 – 2020-11-16 (×9): 1
  Filled 2020-11-05 (×8): qty 1

## 2020-11-05 MED ORDER — AMIODARONE HCL IN DEXTROSE 360-4.14 MG/200ML-% IV SOLN
30.0000 mg/h | INTRAVENOUS | Status: DC
  Administered 2020-11-05 – 2020-11-06 (×3): 60 mg/h via INTRAVENOUS
  Administered 2020-11-06 – 2020-11-14 (×15): 30 mg/h via INTRAVENOUS
  Filled 2020-11-05 (×19): qty 200

## 2020-11-05 MED ORDER — AMIODARONE LOAD VIA INFUSION
150.0000 mg | Freq: Once | INTRAVENOUS | Status: AC
  Administered 2020-11-05: 150 mg via INTRAVENOUS
  Filled 2020-11-05: qty 83.34

## 2020-11-05 MED ORDER — SENNA 8.6 MG PO TABS
1.0000 | ORAL_TABLET | Freq: Every day | ORAL | Status: DC
  Administered 2020-11-05 – 2020-11-07 (×3): 8.6 mg
  Filled 2020-11-05 (×4): qty 1

## 2020-11-05 MED ORDER — HEPARIN SODIUM (PORCINE) 1000 UNIT/ML DIALYSIS
1000.0000 [IU] | INTRAMUSCULAR | Status: DC | PRN
  Administered 2020-11-06: 3100 [IU] via INTRAVENOUS_CENTRAL
  Filled 2020-11-05 (×2): qty 6

## 2020-11-05 MED ORDER — QUETIAPINE FUMARATE 100 MG PO TABS
100.0000 mg | ORAL_TABLET | Freq: Two times a day (BID) | ORAL | Status: DC
  Administered 2020-11-05 – 2020-11-14 (×13): 100 mg
  Filled 2020-11-05 (×15): qty 1

## 2020-11-05 MED ORDER — AMIODARONE HCL IN DEXTROSE 360-4.14 MG/200ML-% IV SOLN
60.0000 mg/h | INTRAVENOUS | Status: DC
  Administered 2020-11-05: 60 mg/h via INTRAVENOUS
  Filled 2020-11-05: qty 200

## 2020-11-05 MED ORDER — AMIODARONE HCL IN DEXTROSE 360-4.14 MG/200ML-% IV SOLN
60.0000 mg/h | INTRAVENOUS | Status: DC
  Administered 2020-11-05: 60 mg/h via INTRAVENOUS
  Filled 2020-11-05 (×2): qty 200

## 2020-11-05 MED ORDER — POTASSIUM CHLORIDE 20 MEQ PO PACK
40.0000 meq | PACK | Freq: Once | ORAL | Status: AC
  Administered 2020-11-05: 40 meq
  Filled 2020-11-05: qty 2

## 2020-11-05 MED ORDER — AMIODARONE HCL IN DEXTROSE 360-4.14 MG/200ML-% IV SOLN
30.0000 mg/h | INTRAVENOUS | Status: DC
  Filled 2020-11-05: qty 200

## 2020-11-05 NOTE — Progress Notes (Signed)
RN called RT to come assess pt due to alarms coming from ventilator. RT at bedside to find pt's expiratory minute ventilation to be high and alarming. RT assessed pt and tubing to check for leaks. Pt's HME was changed as well. RT found pt to be tachypnec, tachycardiac, and using accessory muscles.

## 2020-11-05 NOTE — Progress Notes (Signed)
Held pts CPT d/t tachycardia in 130-150's at this time. RT will continue to monitor.

## 2020-11-05 NOTE — Progress Notes (Signed)
eLink Physician-Brief Progress Note Patient Name: Travis [name] DOB: 06-Jul-1961 MRN: [id number]   Date of Service  11/05/2020  HPI/Events of Note  Remains in Afib with RVR ~140 bpm and on escalating doses of levophed (now 22 mcg/min) despite giving back 500cc LR and changing UF rate to run him net even in terms of fluid balance.   eICU Interventions  Ordered amiodarone bolus and drip. D/c'd oral amiodarone for now while drip is ordered.  Will also have bedside team evaluate the patient.     Intervention Category Major Interventions: Arrhythmia - evaluation and management  Marily Lente Jessa Stinson 11/05/2020, 2:10 AM

## 2020-11-05 NOTE — Progress Notes (Signed)
[facility] for Heparin Indication: chest pain/ACS  Allergies  Allergen Reactions  . Ibuprofen Hives  . Lisinopril Swelling    PT states he is allergic to all prils; caused facial swelling  . Naproxen Hives and Other (See Comments)    Alleve causes patient to have hives    Patient Measurements: Height: 5\' 11"  (180.3 cm) Weight: 76 kg (167 lb 8.8 oz) IBW/kg (Calculated) : 75.3 Heparin Dosing Weight: 86.4 kg   Vital Signs: Temp: 98.6 F (37 C) (11/28 0700) Temp Source: Esophageal (11/28 0400) BP: 151/57 (11/28 0809) Pulse Rate: 138 (11/28 0809)  Labs: Recent Labs    11/03/20 0446 11/03/20 0446 11/03/20 1540 11/04/20 0331 11/04/20 0429 11/04/20 1035 11/04/20 1520 11/05/20 0246  HGB 7.8*   < >  --   --  7.4*  --   --  8.1*  HCT 23.8*  --   --   --  22.5*  --   --  25.5*  PLT 339  --   --   --  304  --   --  377  HEPARINUNFRC 0.45  --   --   --   --  0.25*  --  0.21*  CREATININE 4.19*  --    < > 3.28*  --   --  2.92* 2.84*   < > = values in this interval not displayed.    Estimated Creatinine Clearance: 30.2 mL/min (A) (by C-G formula based on SCr of 2.84 mg/dL (H)).  Assessment: 63 yom presenting with STEMI. Known to have dilated ascending aorta, ESRD. Found to have 99% mid LAD with multivessel in LCx and RCA, now s/p DES to LAD. Heparin was started post sheath removal. Patient was found to have bacteremia and will not proceed with cath intervention at this time. Patient developed Afib during admission treated with amiodarone and successful cardioversion.   Heparin level still subtherapeutic at 0.24 after increasing drip rate to 1650 units/hr. Confirmed with RN that heparin has not been turned off recently. No bleeding or infusion issues. CBC stable, Hgb 8.1, plts 377. Remains on CRRT.   Goal of Therapy:  Heparin level 0.3-0.7 units/mL  Monitor platelets by anticoagulation protocol: Yes   Plan:  Continue heparin infusion at 1800  units/hr. Check next HL with AM labs Monitor daily HL, CBC, s/sx bleeding  Richardine Service, PharmD, BCPS PGY2 Cardiology Pharmacy Resident Phone: [phone] 11/05/2020  10:27 AM  Please check [email or web address] for unit-specific pharmacy phone numbers.

## 2020-11-05 NOTE — Plan of Care (Signed)
  Problem: Activity: Goal: Ability to return to baseline activity level will improve Outcome: Progressing   Problem: Cardiovascular: Goal: Ability to achieve and maintain adequate cardiovascular perfusion will improve Outcome: Progressing Goal: Vascular access site(s) Level 0-1 will be maintained Outcome: Progressing   Problem: Clinical Measurements: Goal: Ability to maintain clinical measurements within normal limits will improve Outcome: Progressing Goal: Will remain free from infection Outcome: Progressing Goal: Diagnostic test results will improve Outcome: Progressing Goal: Respiratory complications will improve Outcome: Progressing Goal: Cardiovascular complication will be avoided Outcome: Progressing   Problem: Activity: Goal: Risk for activity intolerance will decrease Outcome: Progressing   Problem: Pain Managment: Goal: General experience of comfort will improve Outcome: Progressing   Problem: Respiratory: Goal: Ability to maintain a clear airway and adequate ventilation will improve Outcome: Progressing   Problem: Coping: Goal: Level of anxiety will decrease Outcome: Progressing   Problem: Pain Managment: Goal: General experience of comfort will improve Outcome: Progressing

## 2020-11-05 NOTE — Progress Notes (Signed)
Amiodarone Drug - Drug Interaction Consult Note  Recommendations: Switching amiodarone PO to IV infusion 60 mg/hr for 6 hours, then 30 mg/kg.  Amiodarone is metabolized by the cytochrome P450 system and therefore has the potential to cause many drug interactions. Amiodarone has an average plasma half-life of 50 days (range 20 to 100 days).   There is potential for drug interactions to occur several weeks or months after stopping treatment and the onset of drug interactions may be slow after initiating amiodarone.   [x]  Statins: Increased risk of myopathy. Simvastatin- restrict dose to 20mg  daily. Other statins: counsel patients to report any muscle pain or weakness immediately.  [x]  Anticoagulants: Amiodarone can increase anticoagulant effect. Consider warfarin dose reduction. Patients should be monitored closely and the dose of anticoagulant altered accordingly, remembering that amiodarone levels take several weeks to stabilize.  []  Antiepileptics: Amiodarone can increase plasma concentration of phenytoin, the dose should be reduced. Note that small changes in phenytoin dose can result in large changes in levels. Monitor patient and counsel on signs of toxicity.  []  Beta blockers: increased risk of bradycardia, AV block and myocardial depression. Sotalol - avoid concomitant use.  []   Calcium channel blockers (diltiazem and verapamil): increased risk of bradycardia, AV block and myocardial depression.  []   Cyclosporine: Amiodarone increases levels of cyclosporine. Reduced dose of cyclosporine is recommended.  []  Digoxin dose should be halved when amiodarone is started.  []  Diuretics: increased risk of cardiotoxicity if hypokalemia occurs.  []  Oral hypoglycemic agents (glyburide, glipizide, glimepiride): increased risk of hypoglycemia. Patient's glucose levels should be monitored closely when initiating amiodarone therapy.   [x]  Drugs that prolong the QT interval:  Torsades de pointes risk  may be increased with concurrent use - avoid if possible.  Monitor QTc, also keep magnesium/potassium WNL if concurrent therapy can't be avoided. Marland Kitchen Antibiotics: e.g. fluoroquinolones, erythromycin. . Antiarrhythmics: e.g. quinidine, procainamide, disopyramide, sotalol. . Antipsychotics: e.g. phenothiazines, haloperidol.  . Lithium, tricyclic antidepressants, and methadone.  Richardine Service, PharmD, BCPS PGY2 Cardiology Pharmacy Resident Phone: [phone] 11/05/2020  9:01 AM  Please check [email or web address] for unit-specific pharmacy phone numbers.

## 2020-11-05 NOTE — Progress Notes (Signed)
Progress Note  Patient Name: Travis [name] Date of Encounter: 11/05/2020  [facility] Cardiologist: Glenetta Hew, MD   Subjective   Agitated overnight. Back in AFib since 2230h, problems with RVR. On IV amiodarone, now rates 120s. After AFib onset, became hypotensive and is back on IV Norepi (24 mcg/kg/min)  Inpatient Medications    Scheduled Meds: . sodium chloride   Intravenous Once  . aspirin  150 mg Rectal Daily  . atorvastatin  80 mg Per Tube q1800  . B-complex with vitamin C  1 tablet Oral Daily  . chlorhexidine gluconate (MEDLINE KIT)  15 mL Mouth Rinse BID  . Chlorhexidine Gluconate Cloth  6 each Topical Q0600  . clonazepam  1 mg Per Tube BID  . docusate  100 mg Per Tube BID  . [START ON 11/06/2020] doxercalciferol  5 mcg Intravenous Q M,W,F  . feeding supplement (PROSource TF)  45 mL Per Tube QID  . lidocaine  1 patch Transdermal Q24H  . mouth rinse  15 mL Mouth Rinse 10 times per day  . polyethylene glycol  17 g Per Tube Daily  . QUEtiapine  100 mg Per Tube BID  . sevelamer carbonate  0.8 g Per Tube TID  . sodium chloride flush  10-40 mL Intracatheter Q12H  . sodium chloride flush  3 mL Intravenous Q12H  . Thrombi-Pad  1 each Topical Once  . ticagrelor  90 mg Per Tube BID   Continuous Infusions: .  prismasol BGK 4/2.5 400 mL/hr at 11/05/20 0403  .  prismasol BGK 4/2.5 200 mL/hr at 11/05/20 0019  . sodium chloride Stopped (11/03/20 2145)  . sodium chloride Stopped (10/28/20 0954)  . sodium chloride Stopped (11/04/20 0016)  . amiodarone 60 mg/hr (11/05/20 0700)   Followed by  . amiodarone    .  ceFAZolin (ANCEF) IV Stopped (11/04/20 2038)  . famotidine (PEPCID) IV Stopped (11/04/20 1036)  . feeding supplement (VITAL AF 1.2 CAL) 1,000 mL (11/04/20 1528)  . heparin 1,650 Units/hr (11/05/20 0700)  . HYDROmorphone 1 mg/hr (11/05/20 0700)  . norepinephrine (LEVOPHED) Adult infusion 24 mcg/min (11/05/20 0830)  . prismasol BGK 4/2.5    . propofol  (DIPRIVAN) infusion 10 mcg/kg/min (11/05/20 0700)  . vasopressin Stopped (11/04/20 2342)   PRN Meds: sodium chloride, acetaminophen, heparin, heparin, HYDROmorphone, HYDROmorphone (DILAUDID) injection, HYDROmorphone (DILAUDID) injection, LORazepam, midazolam, nitroGLYCERIN, ondansetron (ZOFRAN) IV, sodium chloride, sodium chloride flush, sodium chloride flush   Vital Signs    Vitals:   11/05/20 0630 11/05/20 0645 11/05/20 0700 11/05/20 0809  BP:    (!) 151/57  Pulse: (!) 114 (!) 118 (!) 116 (!) 138  Resp: (!) 24 (!) 21 (!) 23 (!) 24  Temp: 98.6 F (37 C) 98.6 F (37 C) 98.6 F (37 C)   TempSrc:      SpO2: 98% 97% 96% 97%  Weight:      Height:        Intake/Output Summary (Last 24 hours) at 11/05/2020 0847 Last data filed at 11/05/2020 0700 Gross per 24 hour  Intake 3224.67 ml  Output 3652 ml  Net -427.33 ml   Last 3 Weights 11/05/2020 11/04/2020 11/03/2020  Weight (lbs) 167 lb 8.8 oz 167 lb 8.8 oz 175 lb 14.8 oz  Weight (kg) 76 kg 76 kg 79.8 kg      Telemetry    AFib with RVR - Personally Reviewed  ECG    11/27 - NSR, 1st deg AV block, QS V1-V2, ST depression and T wave inversion  V5-V6 - Personally Reviewed - Personally Reviewed  Physical Exam  Sedated intubated GEN: No acute distress.   Neck: No JVD Cardiac: irregular, no diastolic murmurs, rubs, or gallops. The apical holosystolic murmur is heard only intermittently and faintly Respiratory: Clear to auscultation bilaterally. GI: Soft, nontender, non-distended  MS: No edema; No deformity. Neuro:  Nonfocal  Psych: Normal affect   Labs    High Sensitivity Troponin:   Recent Labs  Lab 10/28/20 0629 10/28/20 1046 10/28/20 2040 10/29/20 2121 10/30/20 0548  TROPONINIHS 3,155* >27,000* >27,000* >27,000* >27,000*      Chemistry Recent Labs  Lab 10/30/20 0548 10/31/20 0830 11/04/20 0331 11/04/20 1520 11/05/20 0246  NA 125*   < > 135 134* 134*  K 5.2*   < > 4.0 3.6 3.2*  CL 86*   < > 97* 95* 93*   CO2 22   < > _0 GLUCOSE 148*   < > 118* 139* 146*  BUN 75*   < > 29* 34* 42*  CREATININE 11.18*   < > 3.28* 2.92* 2.84*  CALCIUM 9.1   < > 9.5 9.4 9.2  PROT 5.6*  --   --   --   --   ALBUMIN 2.2*   < > 1.6* 1.7* 1.6*  AST 87*  --   --   --   --   ALT 51*  --   --   --   --   ALKPHOS 95  --   --   --   --   BILITOT 1.5*  --   --   --   --   GFRNONAA 5*   < > 21* 24* 25*  ANIONGAP 17*   < > 13 13 16*   < > = values in this interval not displayed.     Hematology Recent Labs  Lab 11/03/20 0446 11/04/20 0429 11/05/20 0246  WBC 37.3* 30.5* 40.3*  RBC 2.44* 2.27* 2.58*  HGB 7.8* 7.4* 8.1*  HCT 23.8* 22.5* 25.5*  MCV 97.5 99.1 98.8  MCH 32.0 32.6 31.4  MCHC 32.8 32.9 31.8  RDW 16.5* 17.4* 19.6*  PLT 339 304 377    BNPNo results for input(s): BNP, PROBNP in the last 168 hours.   DDimer No results for input(s): DDIMER in the last 168 hours.   Radiology    DG Abd 1 View  Result Date: 11/03/2020 CLINICAL DATA:  Feeding tube adjustment EXAM: ABDOMEN - 1 VIEW COMPARISON:  11/01/2020 FINDINGS: Feeding tube is in place. Contrast was injected through the feeding tube which demonstrates the tip in the duodenal bulb. IMPRESSION: Feeding tube tip in the proximal duodenum. Electronically Signed   By: Rolm Baptise M.D.   On: 11/03/2020 12:58   DG CHEST PORT 1 VIEW  Result Date: 11/04/2020 CLINICAL DATA:  Code stemi. Encounter for routine chest x-ray per order. EXAM: PORTABLE CHEST 1 VIEW COMPARISON:  11/03/2020 FINDINGS: An enteric tube is been placed. The tip is off the field of view but below the left hemidiaphragm. Endotracheal tube and right central venous catheter are unchanged in position. Cardiac enlargement. Shallow inspiration with atelectasis or infiltration in the lung bases. Probable small right pleural effusion. No significant change. No pneumothorax. Mediastinal contours appear intact. IMPRESSION: Appliances appear in satisfactory position. Cardiac enlargement. Shallow  inspiration with atelectasis or infiltration in the lung bases and small right pleural effusion. Electronically Signed   By: Lucienne Capers M.D.   On: 11/04/2020 06:23   [name] LT UPPER  EXTREM LTD SOFT TISSUE NON VASCULAR  Result Date: 11/04/2020 CLINICAL DATA:  Av fistula, arm swelling, query abscess. EXAM: ULTRASOUND left UPPER EXTREMITY LIMITED TECHNIQUE: Ultrasound examination of the upper extremity soft tissues was performed in the area of clinical concern. COMPARISON:  None. FINDINGS: No abnormal fluid collection or specific lesion is identified in the vicinity of the av fistula to suggest abscess or mass. IMPRESSION: No abscess or mass is identified. Electronically Signed   By: Van Clines M.D.   On: 11/04/2020 17:35    Cardiac Studies    Cath PCI 11/20: Acute anterolateral ST elevation MI with Culprit lesion being the 99% mid LAD lesion (just past major 1st Diag/D1) along with multivessel CAD including 99% ostial AV groove LCx and 80% calcified napkin ring proximal RCA lesion with extensive calcification throughout the RCA. ? Successful PTCA and DES PCI of the LAD crossing D1 -resolute Onyx DES 2.75 mm x 18 mm postdilated to 3.1 mm.  Mild to moderate reduced EF with anterior anterolateral hypokinesis, EF roughly 40 to 45%.  Initial evaluation suggested normal EDP, but on recheck post PCI LVEDP of 30 mmHg, severely elevated consistent with ACUTE DIASTOLIC HEART FAILURE  Significantly dilated aortic root requiring AL-1 guide catheter for both Left and Right Coronary Angiography   TTE 10/31/2020: EF 35 to 40%. Moderate decreased function. Moderate concentric LVH GR 3 DD. Severe HK of the mid apical inferolateral and inferior wall with moderate HK of the apical septal and anterior wall. PA pressure estimated 43 mmHg. Severe LA dilation. Moderate RA dilation.Moderate to severe MR.Aortic root dilated at 41 mm. RV PSP 15 mmHg.    TEE 11/01/2020:EF 35-40% moderate decreased  function. Severe LA dilation. No LAA thrombus. Moderate RA dilation. 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 Mild dilation of ascending aorta measuring 40 mm. Moderate grade 3 plaque.  Patient Profile     59 y.o. male with ESRD on HD, ascending Ao aneurysm, admitted with anterior STEMI 11/20, s/p PCI-DES of LAD (with residual disease in his right coronary and AVG-LCx), acute systolic HF with pulmonary edema, 3-4+ eccentric MR, septic shock with MSSA bacteremia and RLL pneumonia, recurrent paroxysmal AFib s/p DCCV 11/23, PEA arrest 11/24 with rapid resuscitation (possibly sedation related). Off pressors and in NSR afternoon of 11/27, recurrent AFib w RVR and hypotension late night 11/27  Assessment & Plan    1. CAD s/p ant STEMI: complicated by acute HF and pulmonary edema. Has residual multivessel disease and will require PCI to RCA and LCX, but currently too unstable. Currently on Brilinta + heparin IV, long term plan for warfarin +plavix. I do have some concern about the use of steroids in the setting of a large anterior MI. 2. Acute HF: due to low EF and severe MR, managed w CRRT. Oxygenating well. Mild vascular congestion and small pleural effusion on today CXR. 3. Septic shock/MSSA bacteremia/pneumonia: improved fairly rapidly w ABx. Now on vasopressin only, Norepi stopped. No clear proof of endocarditis. No vegetations on TEE, but multifocal pneumonia with cavitary lesion suggest septic embolism. Source uncertain. Suspect 4-6 weeks of IV abx regardless of firm diagnosis. On stress dose hydrocortisone. WBC >40K , not sure if due to steroids or new/ongoing infection. Cultures negative x 4 days now. 4. Moderate-to-severe MR: Murmur is barely audible today. TEE suggests secondary (ischemic) mechanism. Might improve after LCX PCI? Currently not  candidate for PCI or  for surgical repair. On my review of TEE 11/24, probably only moderate MR and clearly substantially improved from TTE on 11/23. Reevaluate TTE when hemodynamically stabilized and off vent. 5. Parox AFib: recurrent, with RVR, back on IV amio. Extend the 1 mg/min "loading" infusion for another 6 hours.  On anticoagulation. CHADSVasc at least 3 (HF, CAD, HTN). 6. Ac resp failure/hypoxia: due to Staph pneumonia and CHF. Premature to wean vent. 7. ESRD on chronic HD/now CRRT: BP had improved, worsened with AFib. Continue CRRT, not ready for intermittent HD. 8. Anemia: multifactorial, primarily due to ESRD and Fe deficiency. May become a persistent problem since he will need long term combined anticoagulation and antiplatelet therapy.   CRITICAL CARE Performed by: Dani Gobble Eutha Cude   Total critical care time: 45 minutes  Critical care time was exclusive of separately billable procedures and treating other patients.  Critical care was necessary to treat or prevent imminent or life-threatening deterioration.  Critical care was time spent personally by me on the following activities: development of treatment plan with patient and/or surrogate as well as nursing, discussions with consultants, evaluation of patient's response to treatment, examination of patient, obtaining history from patient or surrogate, ordering and performing treatments and interventions, ordering and review of laboratory studies, ordering and review of radiographic studies, pulse oximetry and re-evaluation of patient's condition.  For questions or updates, please contact [place] Please consult [email or web address] for contact info under        Signed, Sanda Klein, MD  11/05/2020, 8:47 AM

## 2020-11-05 NOTE — Progress Notes (Signed)
[name]  Date of Admission:  10/28/2020     Total days of antibiotics: 8         Current antibiotics: 11/22-c cefazolin  11/21 vanc/piptazo  Reason for visit: Follow up on MSSA bacteremia   ASSESSMENT AND PLAN:   Travis [name] is a 59 y.o. male with past medical history of hypertension, end-stage renal disease on intermittent hemodialysis, coronary artery disease, admitted with sepsis and anterior wall STEMI status post emergent PCI.  Found to have septic shock post procedure and MSSA bacteremia.  Repeat blood cultures are negative and transesophageal echo without definite evidence of vegetations (echodensities noted that could be consistent with calcification in a patient with end-stage renal disease).  CT chest from 11/23 with findings of pneumonia and sputum cultures with Staph aureus.  [name] yesterday without abscess at AV fistula site.   CT abd/pelvis 11/24 without significant findings for infection.  He is not having diarrhea.   #MSSA bacteremia #Pneumonia #Leukocytosis #ESRD #STEMI status post PCI  --Continue renally dosed cefazolin --Rising WBC is of concern for other nidus of infection, however, repeat blood cx are clear.  CT chest with findings of PNA and on abx for MSSA.  He does have central lines that may be an issue.  Could also be reactive from hypotension, afib with RVR --If continues to rise, would repeat cultures and address lines as best as possible --Consider repeat imaging if continues to rise, too   SUBJECTIVE:   Repeat blood cultures from 11/22, 11/23, and 11/24 are no growth to date Remains on the ventilator this morning.  Vent settings stable.  HD instability, agitation and afib noted overnight.  Unable to obtain subjective due to intubated status   OBJECTIVE:   Allergies  Allergen Reactions  . Ibuprofen Hives  . Lisinopril Swelling    PT states he is allergic to all prils; caused facial swelling  . Naproxen  Hives and Other (See Comments)    Alleve causes patient to have hives    Blood pressure (!) 151/57, pulse (!) 138, temperature 98.6 F (37 C), resp. rate (!) 24, height 5\' 11"  (1.803 m), weight 76 kg, SpO2 97 %. Body mass index is 23.37 kg/m.  Physical Exam  General: Patient is currently intubated and sedated on the ventilator. Wife is present.  He appears somewhat agitated Head: Normocephalic and atraumatic.  ET tube in place Pulmonary/Chest: Breathing assisted on vent; symmetric chest rise and fall. Musculoskeletal: No joint deformities Neurological: Sedated Skin: Warm, dry and intact. No rashes or erythema.  HD cath Right IJ   Lab Results & Microbiology Lab Results  Component Value Date   WBC 40.3 (H) 11/05/2020   HGB 8.1 (L) 11/05/2020   HCT 25.5 (L) 11/05/2020   MCV 98.8 11/05/2020   PLT 377 11/05/2020    Lab Results  Component Value Date   NA 134 (L) 11/05/2020   K 3.2 (L) 11/05/2020   CO2 25 11/05/2020   GLUCOSE 146 (H) 11/05/2020   BUN 42 (H) 11/05/2020   CREATININE 2.84 (H) 11/05/2020   CALCIUM 9.2 11/05/2020   GFRNONAA 25 (L) 11/05/2020   GFRAA 6 (L) 02/26/2018    Lab Results  Component Value Date   ALT 51 (H) 10/30/2020   AST 87 (H) 10/30/2020   ALKPHOS 95 10/30/2020   BILITOT 1.5 (H) 10/30/2020     I have reviewed the micro and lab results in Epic.  Imaging DG Abd 1 View  Result Date: 11/03/2020 CLINICAL DATA:  Feeding tube adjustment EXAM: ABDOMEN - 1 VIEW COMPARISON:  11/01/2020 FINDINGS: Feeding tube is in place. Contrast was injected through the feeding tube which demonstrates the tip in the duodenal bulb. IMPRESSION: Feeding tube tip in the proximal duodenum. Electronically Signed   By: Rolm Baptise M.D.   On: 11/03/2020 12:58   DG CHEST PORT 1 VIEW  Result Date: 11/04/2020 CLINICAL DATA:  Code stemi. Encounter for routine chest x-ray per order. EXAM: PORTABLE CHEST 1 VIEW COMPARISON:  11/03/2020 FINDINGS: An enteric tube is been placed. The  tip is off the field of view but below the left hemidiaphragm. Endotracheal tube and right central venous catheter are unchanged in position. Cardiac enlargement. Shallow inspiration with atelectasis or infiltration in the lung bases. Probable small right pleural effusion. No significant change. No pneumothorax. Mediastinal contours appear intact. IMPRESSION: Appliances appear in satisfactory position. Cardiac enlargement. Shallow inspiration with atelectasis or infiltration in the lung bases and small right pleural effusion. Electronically Signed   By: Lucienne Capers M.D.   On: 11/04/2020 06:23   [name] LT UPPER EXTREM LTD SOFT TISSUE NON VASCULAR  Result Date: 11/04/2020 CLINICAL DATA:  Av fistula, arm swelling, query abscess. EXAM: ULTRASOUND left UPPER EXTREMITY LIMITED TECHNIQUE: Ultrasound examination of the upper extremity soft tissues was performed in the area of clinical concern. COMPARISON:  None. FINDINGS: No abnormal fluid collection or specific lesion is identified in the vicinity of the av fistula to suggest abscess or mass. IMPRESSION: No abscess or mass is identified. Electronically Signed   By: Van Clines M.D.   On: 11/04/2020 17:35        Raynelle Highland for [facility] [phone] pager 11/05/2020, 8:59 AM

## 2020-11-05 NOTE — Progress Notes (Signed)
eLink Physician-Brief Progress Note Patient Name: GIBSON LAD DOB: 07-09-1961 MRN: [id number]   Date of Service  11/05/2020  HPI/Events of Note  RN requests SSI Q4H. Patient is on tube feeds. Impaired renal function.   eICU Interventions  Ordered very sensitive Q4H SSI.     Intervention Category Minor Interventions: Routine modifications to care plan (e.g. PRN medications for pain, fever)  Marily Lente Chyanne Kohut 11/05/2020, 11:00 PM

## 2020-11-05 NOTE — Progress Notes (Signed)
ANTICOAGULATION CONSULT NOTE  Pharmacy Consult for Heparin Indication: chest pain/ACS  Assessment: 15 yom presenting with STEMI. Known to have dilated ascending aorta, ESRD. Found to have 99% mid LAD with multivessel in LCx and RCA, now s/p DES to LAD. Heparin was started post sheath removal. Patient was found to have bacteremia and will not proceed with cath intervention at this time. Patient developed Afib during admission treated with amiodarone and successful cardioversion.   Heparin level subtherapeutic at 0.21 units/ml Goal of Therapy:  Heparin level 0.3-0.7 units/mL  Monitor platelets by anticoagulation protocol: Yes   Plan:  Increase heparin infusion to 1650 units/hr. Monitor daily HL, CBC, s/sx bleeding  Thanks for allowing pharmacy to be a part of this patient's care.  Excell Seltzer, PharmD Clinical Pharmacist

## 2020-11-05 NOTE — Progress Notes (Signed)
RT held CPT through bed due to pt's Afib. Will assess at next scheduled time.

## 2020-11-05 NOTE — Progress Notes (Addendum)
eLink Physician-Brief Progress Note Patient Name: Travis [name] DOB: 10-28-1961 MRN: [id number]   Date of Service  11/05/2020  HPI/Events of Note  K 3.2. On CRRT with 2.5 K dialysate.  eICU Interventions  Will give 40 mEq KCl IV now. Nephrology may wish to consider changing dialysate fluid in AM.   ADDENDUM: Changed route to PO due to limited central line ports.  Intervention Category Intermediate Interventions: Electrolyte abnormality - evaluation and management  Charlott Rakes 11/05/2020, 3:57 AM

## 2020-11-05 NOTE — Progress Notes (Signed)
NAME:  Travis [name], MRN:  [id number], DOB:  January 22, 1961, LOS: 8 ADMISSION DATE:  10/28/2020, CONSULTATION DATE:  10/29/20 REFERRING MD:  Cardiology - [name], CHIEF COMPLAINT:  Hypotension, gram positive cocci on culture, AMS  Brief History   Travis [name] is a 59 year old man with a history of HTN, ESRD on HD MWF, RUE AV fistula, hx of smoking, admitted 10/28/20 with fevers, myalgias for several days, acute chest pain.    History of present illness   On Admission, found to have STEMI, multivessel disease. Pulm edema with mild hypoxemia.  S/Post PCI to the LAD on 10/29/19. Planning for staged intervention to the left circumflex and possibly right coronary artery.  EF 35-57%, diastolic dysfunction, LVEDP 35. Underwent HD on 11/20 with 4 L volume removed.  Saturation improved after dialysis (100% on RA) Fevers noted 11/20. Blood cultures grew MSSA, noted this evening. .    This evening developed Afib with RVR 180s. Adenosine given, without improvement  Amiodarone infusion started per cardiology, 150mg  bolus, then drip.  Lopressor 5mg IV and 500ccNS given. Started on Phenylephrine, was on 21mcg on my arrival.   Cardioverted emergently at 120J for ongoing afib with hypotension (60/40) and AMS.  Converted to sinus.  Remained moderately hypotensive (MAP 60) on phenylephrine.   Patient was given one dose zosyn, switched to ancef once cultures grew back MSSA.   Cardiac stress tests annually, last done 11/16/19.    Past Medical History  HTN ESRD, HD MWF  Significant Hospital Events   Cardioversion 11/21 Cardiac cath stent to LAD 11/20  Consults:  PCCM  Nephrology  Procedures:  Central line 11/21  Arterial line 11/21 Repeat DCCV 11/23 HD line 11/24 Arterial line 11/24  Significant Diagnostic Tests:  11/23 Echo Pending 11/22 LUE Vas Upper Extremity Doppler>> Arteriovenous fistula-Aneurysmal dilatation noted. 11/24 CT abdomen - no retroperitoneal hematoma 11/24 transthoracic  echocardiogram-severe LV dysfunction EF 35% with restrictive diastolic function severe inferior wall motion abnormalities.  Moderate to severe mitral regurgitation. Micro Data:   11/21 Staph aureus > MSSA by BCID >>> 11/22 Cultures negative. Antimicrobials:  Zosyn 11/21 x 1 Ancef 11/21 ->  Interim history/subjective:   More agitated overnight but then developed rapid atrial fibrillation requiring reinitiation of vasopressors. Objective   Blood pressure (!) 116/39, pulse (!) 128, temperature 98.6 F (37 C), resp. rate (!) 27, height 5\' 11"  (1.803 m), weight 76 kg, SpO2 98 %.    Vent Mode: PRVC FiO2 (%):  [40 %] 40 % Set Rate:  [15 bmp] 15 bmp Vt Set:  [600 mL] 600 mL PEEP:  [5 cmH20] 5 cmH20 Plateau Pressure:  [14 cmH20-20 cmH20] 18 cmH20   Intake/Output Summary (Last 24 hours) at 11/05/2020 1426 Last data filed at 11/05/2020 1200 Gross per 24 hour  Intake 3230.75 ml  Output 3002 ml  Net 228.75 ml   Filed Weights   11/03/20 0500 11/04/20 0500 11/05/20 0500  Weight: 79.8 kg 76 kg 76 kg    Examination:  General: middle aged adult male, intubated and sedated.  Average build HENT: ETT tube in place. Minimal secretions. Cortrak in place, Lungs: no ventilator dyssynchrony. Normal vesicular breath sounds throughout. Cardiovascular: in sinus rhythm with warm extremities. Capillary refill 3s.  Heart sounds are distant but unremarkable Abdomen: soft with no distention.  Extremities: no acute deformity or ROM limitation. No edema.  Left upper extremity fistula. Neuro: More awake does not follow commands but masticate on tube trying to open his eyes.  Becomes tachypneic with  stimulation  Resolved Hospital Problem list     Assessment & Plan:   Critically ill due to severe sepsis with septic shock due to MSSA bacteremia requiring titration of vasopressors. -Resume vasopressin in preference to norepinephrine as this may help limit tachycardia -Wean off pressors to keep MAP greater  than 65  Critically ill due to acute hypoxic respiratory failure due to MSSA pneumonia with cavitation. -Continue full ventilatory support -Chest physiotherapy -Have initiated SBT's.  -Mental status precludes extubation at this time  Critically ill due to acute toxic metabolic encephalopathy with agitation requiring titration of IV sedatives.  Agitation is presently his major barrier to extubation. -Remains on low-dose Dilaudid -Propofol has been discontinued -Enteral sedation increase -Continue as needed lorazepam  MSSA bacteremia likely from HD access.  No evidence of fistula or abscess.  Or endocarditis on TEE -Complete 6 weeks of IV cefazolin -Follow leukocytosis likely reactive.  Afib now cardioverted to sinus rhythm on amiodarone following DCCV -Continue IV amiodarone  STEMI/CAD. On Heparin, brillinta, asa.   - Will eventually need a return to cath lab for staged PCI  Hyponatremia - nephrology following, last HD 11/22 - Trend BMET  ESRD:  -Continue CRRT -Start fluid removal net -50 mL/h.   Best practice:  Diet: Continue tube feeding Pain/Anxiety/Delirium protocol (if indicated): hydromorphone infusion to RASS -1, have added lorazepam IV as needed to minimize propofol use.  On enteral Seroquel VAP protocol (if indicated): Bundle in place DVT prophylaxis: on heparin gtt GI prophylaxis: famotidine Glucose control: Phase 1 glycemic control Mobility: Bedrest Code Status: Full  Family Communication: Spoke with wife  at bedside 11/28 Disposition: ICU  Labs   CBC: Recent Labs  Lab 10/29/20 2121 10/29/20 2128 11/02/20 0301 11/02/20 2352 11/03/20 0446 11/04/20 0429 11/05/20 0246  WBC 12.9*   < > 27.9* 37.5* 37.3* 30.5* 40.3*  NEUTROABS 11.8*  --   --   --   --   --   --   HGB 6.0*   < > 7.5* 8.0* 7.8* 7.4* 8.1*  HCT 18.8*   < > 22.0* 24.0* 23.8* 22.5* 25.5*  MCV 93.5   < > 95.7 97.6 97.5 99.1 98.8  PLT 86*   < > 295 355 339 304 377   < > = values in this  interval not displayed.    Basic Metabolic Panel: Recent Labs  Lab 11/03/20 0446 11/03/20 0446 11/03/20 1532 11/03/20 1540 11/04/20 0331 11/04/20 1520 11/05/20 0246 11/05/20 1002  NA 134*   < >  --  135 135 134* 134* 135  K 5.0   < >  --  4.4 4.0 3.6 3.2* 3.6  CL 96*   < >  --  98 97* 95* 93* 96*  CO2 23   < >  --  23 25 26 25 24   GLUCOSE 121*   < >  --  126* 118* 139* 146* 151*  BUN 34*   < >  --  35* 29* 34* 42* 45*  CREATININE 4.19*   < >  --  4.06* 3.28* 2.92* 2.84* 2.85*  CALCIUM 9.8   < >  --  9.4 9.5 9.4 9.2 8.9  MG 3.0*  --  3.0*  --  2.9* 2.8* 2.8*  --   PHOS 6.3*   < > 6.0* 6.0* 4.6 3.1 2.7  2.7  --    < > = values in this interval not displayed.   GFR: Estimated Creatinine Clearance: 30.1 mL/min (A) (by C-G formula based  on SCr of 2.85 mg/dL (H)). Recent Labs  Lab 10/30/20 0820 10/31/20 0030 10/31/20 1327 11/01/20 0412 11/01/20 0430 11/01/20 0625 11/01/20 2045 11/02/20 2352 11/03/20 0446 11/04/20 0429 11/05/20 0246  WBC  --    < >  --   --    < >  --    < > 37.5* 37.3* 30.5* 40.3*  LATICACIDVEN 2.1*  --  2.5* 5.5*  --  2.1*  --   --   --   --   --    < > = values in this interval not displayed.    Liver Function Tests: Recent Labs  Lab 10/30/20 0548 10/31/20 0830 11/03/20 0446 11/03/20 1540 11/04/20 0331 11/04/20 1520 11/05/20 0246  AST 87*  --   --   --   --   --   --   ALT 51*  --   --   --   --   --   --   ALKPHOS 95  --   --   --   --   --   --   BILITOT 1.5*  --   --   --   --   --   --   PROT 5.6*  --   --   --   --   --   --   ALBUMIN 2.2*   < > 1.7* 1.6* 1.6* 1.7* 1.6*   < > = values in this interval not displayed.   No results for input(s): LIPASE, AMYLASE in the last 168 hours. No results for input(s): AMMONIA in the last 168 hours.  ABG    Component Value Date/Time   PHART 7.295 (L) 11/01/2020 0336   PCO2ART 39.9 11/01/2020 0336   PO2ART 71 (L) 11/01/2020 0336   HCO3 19.4 (L) 11/01/2020 0336   TCO2 21 (L) 11/01/2020 0336    ACIDBASEDEF 7.0 (H) 11/01/2020 0336   O2SAT 82.0 11/01/2020 1130     Coagulation Profile: Recent Labs  Lab 10/30/20 0548  INR 1.2    Cardiac Enzymes: No results for input(s): CKTOTAL, CKMB, CKMBINDEX, TROPONINI in the last 168 hours.  HbA1C: Hgb A1c MFr Bld  Date/Time Value Ref Range Status  10/28/2020 06:04 AM 4.3 (L) 4.8 - 5.6 % Final    Comment:    (NOTE) Pre diabetes:          5.7%-6.4%  Diabetes:              >6.4%  Glycemic control for   <7.0% adults with diabetes   01/30/2015 09:09 PM 5.1 4.8 - 5.6 % Final    Comment:    (NOTE)         Pre-diabetes: 5.7 - 6.4         Diabetes: >6.4         Glycemic control for adults with diabetes: <7.0     CBG: Recent Labs  Lab 11/04/20 1924 11/04/20 2321 11/05/20 0347 11/05/20 0746 11/05/20 1206  GLUCAP 164* 149* 124* 146* 137*    Critical care time: 40 minutes     Kipp Brood, MD [facility] ICU Physician [place]  Pager: [phone] Or Epic Secure Chat After hours: [phone].  11/05/2020, 2:26 PM

## 2020-11-05 NOTE — Progress Notes (Signed)
[facility] Progress Note   Assessment/ Plan:   OP HD:AF MWF 4h 450/800 81.5kg 2/2.25 bath RUE AVF Hep 5000+ 1052mdrun - hect 5 ug tiw  - mircera 50 ug q 4, last 11/15 (due 11/29)    Assessment/ Plan: 1. Acute STEMI - SP LAD PCI 11/20 am, has other disease in LCx/ RCA. Transitioned to brilinta from cangrelor. Per cardiology. 2. Acute hypoxic RF and cavitary pneumonia - had pulm edema by CXR w/ SOB / coughing. Initially better but had more tachypnea and coughing, had CT chest  11/23 with multifocal PNA and likely cavitary lesion.  Intubated 11/23.  Per PCCM.  Respiratory culture 11/23 with MSSA 3. PEA arrest: occurred in setting of agitation/ sedation.  ROSC with 1 mg epi/ CPR/ 1 min. 4. MSSA bacteremia- cultures initially positive 11/21.  on cefazolin,  Cultures 11/22, 11/23, and 11/24 all negative, plan for 6 weeks of IV cefazolin per pharmacy.  CT abd/ pelvis negative 11/23.  Dialysis duplex 11/22 without abscess- was getting dialysis at the time of the study so will d/w PVL lab to make sure study was adequate--> will do an [place] AVF, negative 11/27.   5. ESRD - usual HD MWF. Had LHC w/ high LVEDP +35 mmHg. Lowering vol w/ HD as tolerated. Started CRRT 11/24, change to all 4K bath, no heparin d/t hep gtt.  Keep even today 6. Shock: presumably septic+/- cardiogenic, pressors and stress dose steroids per PCCM 7. 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 Afib: on amio and hep gtt, s/p DCCV--> hemodynamics worsened after going back into Afib overnight. 10. Severe MR: per cardiology  Subjective:    Back into Afib overnight, back on pressors.  K 3.2 this AM, WBC ct up to 40   Objective:   BP (!) 151/57   Pulse (!) 138   Temp 98.6 F (37 C)   Resp (!) 24   Ht _0  (1.803 m)   Wt 76 kg   SpO2 97%   BMI 23.37 kg/m   Physical Exam: GGBE:EFEOFin bed, intubated, sedated CHQR:FXJOITGPQDI  no m/r/g Resp: tachypneic Abd: soft Ext: no gross edema ACCESS: LUE AVF + T/B  Labs: BMET Recent Labs  Lab 11/02/20 1551 11/02/20 2352 11/03/20 0446 11/03/20 1532 11/03/20 1540 11/04/20 0331 11/04/20 1520 11/05/20 0246  NA 135 134* 134*  --  135 135 134* 134*  K 4.8 5.0 5.0  --  4.4 4.0 3.6 3.2*  CL 97* 96* 96*  --  98 97* 95* 93*  CO2 _1 --  _2 GLUCOSE 101* 116* 121*  --  126* 118* 139* 146*  BUN 39* 36* 34*  --  35* 29* 34* 42*  CREATININE 5.16* 4.20* 4.19*  --  4.06* 3.28* 2.92* 2.84*  CALCIUM 8.9 9.4 9.8  --  9.4 9.5 9.4 9.2  PHOS 5.8*  --  6.3* 6.0* 6.0* 4.6 3.1 2.7  2.7   CBC Recent Labs  Lab 10/29/20 2121 10/29/20 2128 11/02/20 2352 11/03/20 0446 11/04/20 0429 11/05/20 0246  WBC 12.9*   < > 37.5* 37.3* 30.5* 40.3*  NEUTROABS 11.8*  --   --   --   --   --   HGB 6.0*   < > 8.0* 7.8* 7.4* 8.1*  HCT 18.8*   < > 24.0* 23.8* 22.5* 25.5*  MCV 93.5   < >  97.6 97.5 99.1 98.8  PLT 86*   < > 355 339 304 377   < > = values in this interval not displayed.      Medications:    . sodium chloride   Intravenous Once  . [START ON 11/06/2020] aspirin  81 mg Per Tube Daily  . atorvastatin  80 mg Per Tube q1800  . B-complex with vitamin C  1 tablet Per Tube Daily  . chlorhexidine gluconate (MEDLINE KIT)  15 mL Mouth Rinse BID  . Chlorhexidine Gluconate Cloth  6 each Topical Q0600  . clonazepam  1 mg Per Tube BID  . docusate  100 mg Per Tube BID  . [START ON 11/06/2020] doxercalciferol  5 mcg Intravenous Q M,W,F  . feeding supplement (PROSource TF)  45 mL Per Tube QID  . lidocaine  1 patch Transdermal Q24H  . mouth rinse  15 mL Mouth Rinse 10 times per day  . polyethylene glycol  17 g Per Tube Daily  . QUEtiapine  100 mg Per Tube BID  . sevelamer carbonate  0.8 g Per Tube TID  . sodium chloride flush  10-40 mL Intracatheter Q12H  . sodium chloride flush  3 mL Intravenous Q12H  . Thrombi-Pad  1 each Topical Once  . ticagrelor  90 mg Per Tube BID      Madelon Lips MD 11/05/2020, 10:20 AM

## 2020-11-05 NOTE — Progress Notes (Signed)
Pt started waking up and having increased agitation, at the same time rhythm changed from NSR to A fib with HR ranging from 130-140 sustained. PRN Ativan given causing hypotension, Pt Fluid Removal rate decreased. Hypotension still sustained, Levo started.  E-link notified, MD on-call orders given for 592mL bolus. Will continue to monitor closely.

## 2020-11-05 NOTE — Progress Notes (Signed)
Due to pt's Afib HR, CPT was held until next scheduled time.

## 2020-11-06 ENCOUNTER — Inpatient Hospital Stay (HOSPITAL_COMMUNITY): Payer: Medicare Other

## 2020-11-06 DIAGNOSIS — B9561 Methicillin susceptible Staphylococcus aureus infection as the cause of diseases classified elsewhere: Secondary | ICD-10-CM

## 2020-11-06 DIAGNOSIS — I4891 Unspecified atrial fibrillation: Secondary | ICD-10-CM | POA: Diagnosis not present

## 2020-11-06 DIAGNOSIS — I25110 Atherosclerotic heart disease of native coronary artery with unstable angina pectoris: Secondary | ICD-10-CM | POA: Diagnosis not present

## 2020-11-06 DIAGNOSIS — I5041 Acute combined systolic (congestive) and diastolic (congestive) heart failure: Secondary | ICD-10-CM

## 2020-11-06 DIAGNOSIS — A419 Sepsis, unspecified organism: Secondary | ICD-10-CM | POA: Diagnosis not present

## 2020-11-06 DIAGNOSIS — Z9911 Dependence on respirator [ventilator] status: Secondary | ICD-10-CM

## 2020-11-06 DIAGNOSIS — I219 Acute myocardial infarction, unspecified: Secondary | ICD-10-CM | POA: Diagnosis not present

## 2020-11-06 DIAGNOSIS — I2109 ST elevation (STEMI) myocardial infarction involving other coronary artery of anterior wall: Secondary | ICD-10-CM | POA: Diagnosis not present

## 2020-11-06 DIAGNOSIS — R6521 Severe sepsis with septic shock: Secondary | ICD-10-CM

## 2020-11-06 DIAGNOSIS — R7881 Bacteremia: Secondary | ICD-10-CM | POA: Diagnosis not present

## 2020-11-06 DIAGNOSIS — J9601 Acute respiratory failure with hypoxia: Secondary | ICD-10-CM

## 2020-11-06 LAB — RENAL FUNCTION PANEL
Albumin: 1.5 g/dL — ABNORMAL LOW (ref 3.5–5.0)
Anion gap: 15 (ref 5–15)
BUN: 52 mg/dL — ABNORMAL HIGH (ref 6–20)
CO2: 22 mmol/L (ref 22–32)
Calcium: 9.1 mg/dL (ref 8.9–10.3)
Chloride: 98 mmol/L (ref 98–111)
Creatinine, Ser: 2.87 mg/dL — ABNORMAL HIGH (ref 0.61–1.24)
GFR, Estimated: 25 mL/min — ABNORMAL LOW (ref >60)
Glucose, Bld: 162 mg/dL — ABNORMAL HIGH (ref 70–99)
Phosphorus: 3.8 mg/dL (ref 2.5–4.6)
Potassium: 4.3 mmol/L (ref 3.5–5.1)
Sodium: 135 mmol/L (ref 135–145)

## 2020-11-06 LAB — CBC
HCT: 22.2 % — ABNORMAL LOW (ref 39.0–52.0)
Hemoglobin: 7 g/dL — ABNORMAL LOW (ref 13.0–17.0)
MCH: 31.5 pg (ref 26.0–34.0)
MCHC: 31.5 g/dL (ref 30.0–36.0)
MCV: 100 fL (ref 80.0–100.0)
Platelets: 290 10*3/uL (ref 150–400)
RBC: 2.22 MIL/uL — ABNORMAL LOW (ref 4.22–5.81)
RDW: 21.2 % — ABNORMAL HIGH (ref 11.5–15.5)
WBC: 38.5 10*3/uL — ABNORMAL HIGH (ref 4.0–10.5)
nRBC: 4.9 % — ABNORMAL HIGH (ref 0.0–0.2)

## 2020-11-06 LAB — GLUCOSE, CAPILLARY
Glucose-Capillary: 113 mg/dL — ABNORMAL HIGH (ref 70–99)
Glucose-Capillary: 119 mg/dL — ABNORMAL HIGH (ref 70–99)
Glucose-Capillary: 112 mg/dL — ABNORMAL HIGH (ref 70–99)
Glucose-Capillary: 79 mg/dL (ref 70–99)
Glucose-Capillary: 109 mg/dL — ABNORMAL HIGH (ref 70–99)
Glucose-Capillary: 128 mg/dL — ABNORMAL HIGH (ref 70–99)

## 2020-11-06 LAB — CULTURE, BLOOD (ROUTINE X 2)
Culture: NO GROWTH
Culture: NO GROWTH
Special Requests: ADEQUATE

## 2020-11-06 LAB — PREPARE RBC (CROSSMATCH)

## 2020-11-06 LAB — HEMOGLOBIN AND HEMATOCRIT, BLOOD
HCT: 22.4 % — ABNORMAL LOW (ref 39.0–52.0)
Hemoglobin: 6.9 g/dL — CL (ref 13.0–17.0)

## 2020-11-06 LAB — MAGNESIUM: Magnesium: 2.7 mg/dL — ABNORMAL HIGH (ref 1.7–2.4)

## 2020-11-06 LAB — HEPARIN LEVEL (UNFRACTIONATED): Heparin Unfractionated: 0.5 IU/mL (ref 0.30–0.70)

## 2020-11-06 MED ORDER — SODIUM CHLORIDE 0.9% IV SOLUTION: Freq: Once | INTRAVENOUS | Status: AC

## 2020-11-06 MED ORDER — HEPARIN SODIUM (PORCINE) 1000 UNIT/ML DIALYSIS
1000.0000 [IU] | Freq: Once | INTRAMUSCULAR | Status: AC
  Administered 2020-11-06: 3100 [IU] via INTRAVENOUS_CENTRAL
  Filled 2020-11-06: qty 3
  Filled 2020-11-06: qty 6

## 2020-11-06 MED ORDER — HEPARIN (PORCINE) 25000 UT/250ML-% IV SOLN
1700.0000 [IU]/h | INTRAVENOUS | Status: DC
  Administered 2020-11-06: 1800 [IU]/h via INTRAVENOUS
  Administered 2020-11-07: 1850 [IU]/h via INTRAVENOUS
  Administered 2020-11-08: 1750 [IU]/h via INTRAVENOUS
  Administered 2020-11-08: 1850 [IU]/h via INTRAVENOUS
  Filled 2020-11-06 (×4): qty 250

## 2020-11-06 MED ORDER — PANTOPRAZOLE SODIUM 40 MG IV SOLR
40.0000 mg | Freq: Two times a day (BID) | INTRAVENOUS | Status: DC
  Administered 2020-11-06 – 2020-11-14 (×18): 40 mg via INTRAVENOUS
  Filled 2020-11-06 (×19): qty 40

## 2020-11-06 MED ORDER — HYDROMORPHONE BOLUS VIA INFUSION
4.0000 mg | Freq: Once | INTRAVENOUS | Status: AC
  Administered 2020-11-06: 4 mg via INTRAVENOUS
  Filled 2020-11-06: qty 4

## 2020-11-06 MED ORDER — BISACODYL 10 MG RE SUPP: 10.0000 mg | Freq: Every day | RECTAL | Status: DC | PRN

## 2020-11-06 NOTE — Progress Notes (Signed)
Patient transported to MRI and back without complications. RN at bedside. 

## 2020-11-06 NOTE — Procedures (Signed)
Central Venous Catheter Insertion Procedure Note  Travis [name]  [id number]  29-Nov-1961  Date:11/06/20  Time:8:46 PM   Provider Performing:Jakyiah Briones Shearon Stalls   Procedure: Insertion of Non-tunneled Central Venous Catheter(36556) without US guidance  Indication(s) Medication administration and Difficult access  Consent Risks of the procedure as well as the alternatives and risks of each were explained to the patient and/or caregiver.  Consent for the procedure was obtained and is signed in the bedside chart  Anesthesia Topical only with 1% lidocaine   Timeout Verified patient identification, verified procedure, site/side was marked, verified correct patient position, special equipment/implants available, medications/allergies/relevant history reviewed, required imaging and test results available.  Sterile Technique Maximal sterile technique including full sterile barrier drape, hand hygiene, sterile gown, sterile gloves, mask, hair covering, sterile ultrasound probe cover (if used).  Procedure Description Area of catheter insertion was cleaned with chlorhexidine and draped in sterile fashion.  Without real-time ultrasound guidance a central venous catheter was placed into the left subclavian vein. Nonpulsatile blood flow and easy flushing noted in all ports.  The catheter was sutured in place and sterile dressing applied. Attempted left IJ placement at first; however, poor blood return after vessel cannulated despite multiple attempts.  Therefore aborted site and moved to left subclavian.  Complications/Tolerance None; patient tolerated the procedure well. Chest X-ray is ordered to verify placement for internal jugular or subclavian cannulation.   Chest x-ray is not ordered for femoral cannulation.  EBL Minimal  Specimen(s) None   Montey Hora, [place] [facility] 11/06/2020, 8:46 PM

## 2020-11-06 NOTE — Progress Notes (Signed)
 Progress Note  Patient Name: Travis [name] Date of Encounter: 11/06/2020  [facility] Cardiologist: David Harding, MD   Subjective   Intubated, sedated  Inpatient Medications    Scheduled Meds: . sodium chloride   Intravenous Once  . aspirin  81 mg Per Tube Daily  . atorvastatin  80 mg Per Tube q1800  . B-complex with vitamin C  1 tablet Per Tube Daily  . chlorhexidine gluconate (MEDLINE KIT)  15 mL Mouth Rinse BID  . Chlorhexidine Gluconate Cloth  6 each Topical Q0600  . clonazepam  1 mg Per Tube BID  . docusate  100 mg Per Tube BID  . doxercalciferol  5 mcg Intravenous Q M,W,F  . feeding supplement (PROSource TF)  45 mL Per Tube QID  . insulin aspart  0-6 Units Subcutaneous Q4H  . lidocaine  1 patch Transdermal Q24H  . mouth rinse  15 mL Mouth Rinse 10 times per day  . pantoprazole (PROTONIX) IV  40 mg Intravenous Q12H  . polyethylene glycol  17 g Per Tube Daily  . QUEtiapine  100 mg Per Tube BID  . senna  1 tablet Per Tube QHS  . sevelamer carbonate  0.8 g Per Tube TID  . sodium chloride flush  10-40 mL Intracatheter Q12H  . sodium chloride flush  3 mL Intravenous Q12H  . Thrombi-Pad  1 each Topical Once  . ticagrelor  90 mg Per Tube BID   Continuous Infusions: .  prismasol BGK 4/2.5 400 mL/hr at 11/06/20 0452  .  prismasol BGK 4/2.5 200 mL/hr at 11/05/20 1946  . sodium chloride Stopped (11/03/20 2145)  . sodium chloride Stopped (10/28/20 0954)  . sodium chloride Stopped (11/04/20 0016)  . amiodarone 30 mg/hr (11/06/20 1200)  .  ceFAZolin (ANCEF) IV Stopped (11/06/20 0849)  . feeding supplement (VITAL AF 1.2 CAL) 1,000 mL (11/04/20 1528)  . heparin 1,800 Units/hr (11/06/20 1210)  . HYDROmorphone 2.5 mg/hr (11/06/20 1200)  . norepinephrine (LEVOPHED) Adult infusion 4 mcg/min (11/06/20 1200)  . prismasol BGK 4/2.5 2,000 mL/hr at 11/06/20 1031  . propofol (DIPRIVAN) infusion Stopped (11/05/20 0908)  . vasopressin 0.03 Units/min (11/06/20 1200)   PRN  Meds: sodium chloride, acetaminophen, heparin, heparin, HYDROmorphone, HYDROmorphone (DILAUDID) injection, HYDROmorphone (DILAUDID) injection, LORazepam, midazolam, nitroGLYCERIN, ondansetron (ZOFRAN) IV, sodium chloride, sodium chloride flush, sodium chloride flush   Vital Signs    Vitals:   11/06/20 1130 11/06/20 1145 11/06/20 1200 11/06/20 1215  BP:      Pulse:      Resp: 18 18 16 17  Temp: (!) 97.3 F (36.3 C) (!) 97.3 F (36.3 C) (!) 97.5 F (36.4 C) (!) 97.5 F (36.4 C)  TempSrc:      SpO2:      Weight:      Height:        Intake/Output Summary (Last 24 hours) at 11/06/2020 1220 Last data filed at 11/06/2020 1200 Gross per 24 hour  Intake 3434.57 ml  Output 3374 ml  Net 60.57 ml   Last 3 Weights 11/06/2020 11/05/2020 11/04/2020  Weight (lbs) 167 lb 1.7 oz 167 lb 8.8 oz 167 lb 8.8 oz  Weight (kg) 75.8 kg 76 kg 76 kg      Telemetry    NSR - Personally Reviewed  ECG    NSR, ant Q waves - Personally Reviewed  Physical Exam   GEN: No acute distress. Intubated, sedated  Neck: No JVD Cardiac: RRR, no murmurs, rubs, or gallops.  Respiratory: Coarse breath   sounds to auscultation bilaterally. GI: Soft, nontender, non-distended  MS: No edema; No deformity. Neuro:  intubated, sedate Psych: Intubated, sedated  Labs    High Sensitivity Troponin:   Recent Labs  Lab 10/28/20 0629 10/28/20 1046 10/28/20 2040 10/29/20 2121 10/30/20 0548  TROPONINIHS 3,155* >27,000* >27,000* >27,000* >27,000*      Chemistry Recent Labs  Lab 11/05/20 0246 11/05/20 0246 11/05/20 1002 11/05/20 1736 11/06/20 0205  NA 134*   < > 135 135 135  K 3.2*   < > 3.6 4.1 4.3  CL 93*   < > 96* 98 98  CO2 25   < > 24 24 22  GLUCOSE 146*   < > 151* 133* 162*  BUN 42*   < > 45* 50* 52*  CREATININE 2.84*   < > 2.85* 2.84* 2.87*  CALCIUM 9.2   < > 8.9 9.0 9.1  ALBUMIN 1.6*  --   --  1.6* 1.5*  GFRNONAA 25*   < > 25* 25* 25*  ANIONGAP 16*   < > 15 13 15   < > = values in this  interval not displayed.     Hematology Recent Labs  Lab 11/04/20 0429 11/04/20 0429 11/05/20 0246 11/06/20 0205 11/06/20 1041  WBC 30.5*  --  40.3* 38.5*  --   RBC 2.27*  --  2.58* 2.22*  --   HGB 7.4*   < > 8.1* 7.0* 6.9*  HCT 22.5*   < > 25.5* 22.2* 22.4*  MCV 99.1  --  98.8 100.0  --   MCH 32.6  --  31.4 31.5  --   MCHC 32.9  --  31.8 31.5  --   RDW 17.4*  --  19.6* 21.2*  --   PLT 304  --  377 290  --    < > = values in this interval not displayed.    BNPNo results for input(s): BNP, PROBNP in the last 168 hours.   DDimer No results for input(s): DDIMER in the last 168 hours.   Radiology    DG Chest Port 1 View  Result Date: 11/06/2020 CLINICAL DATA:  Dialysis. EXAM: PORTABLE CHEST 1 VIEW COMPARISON:  November 04, 2020. FINDINGS: Stable cardiomediastinal silhouette. Endotracheal and feeding tubes are unchanged in position. Right internal jugular catheter is unchanged. No pneumothorax is noted. Stable bibasilar atelectasis or infiltrates are noted, with probable small right pleural effusion. Bony thorax is unremarkable. IMPRESSION: Stable support apparatus. Stable bibasilar atelectasis or infiltrates are noted, with probable small right pleural effusion. Electronically Signed   By: James  Green Jr M.D.   On: 11/06/2020 10:42   US LT UPPER EXTREM LTD SOFT TISSUE NON VASCULAR  Result Date: 11/04/2020 CLINICAL DATA:  Av fistula, arm swelling, query abscess. EXAM: ULTRASOUND left UPPER EXTREMITY LIMITED TECHNIQUE: Ultrasound examination of the upper extremity soft tissues was performed in the area of clinical concern. COMPARISON:  None. FINDINGS: No abnormal fluid collection or specific lesion is identified in the vicinity of the av fistula to suggest abscess or mass. IMPRESSION: No abscess or mass is identified. Electronically Signed   By: Walter  Liebkemann M.D.   On: 11/04/2020 17:35    Cardiac Studies   Cath results reviewed  Patient Profile     59 y.o. male with  CAD  Assessment & Plan    CAD/MI: Residual disease in the circ and RCA. When stable, consider PCI.  Getting ticagrelor via NG tube.   PAF:  Decrease amio to the maintenance dose.      Discussed findings with the patient's wife in person and her son by phone.  For questions or updates, please contact [facility] Please consult [email or web address] for contact info under        Signed,  , MD  11/06/2020, 12:20 PM   

## 2020-11-06 NOTE — Progress Notes (Signed)
eLink Physician-Brief Progress Note Patient Name: Travis [name] DOB: August 06, 1961 MRN: [id number]   Date of Service  11/06/2020  HPI/Events of Note  Hgb 7.4 (2 days ago) -> 8.1 (y/day) -> 7.0 (today).  RN reports some coffee-ground material suctioned from OG tube and ETT.   eICU Interventions  Repeat H/H at 1100.     Intervention Category Minor Interventions: Other:  Charlott Rakes 11/06/2020, 2:56 AM

## 2020-11-06 NOTE — Progress Notes (Signed)
NAME:  Travis [name], MRN:  [id number], DOB:  02/05/1961, LOS: 9 ADMISSION DATE:  10/28/2020, CONSULTATION DATE:  10/29/20 REFERRING MD:  Cardiology - [name], CHIEF COMPLAINT:  Hypotension, gram positive cocci on culture, AMS  Brief History   Mr. Coey is a 59 year old man with a history of HTN, ESRD on HD MWF, RUE AV fistula, hx of smoking, admitted 10/28/20 with fevers, myalgias for several days, acute chest pain.    History of present illness   On Admission, found to have STEMI, multivessel disease. Pulm edema with mild hypoxemia.  S/Post PCI to the LAD on 10/29/19. Planning for staged intervention to the left circumflex and possibly right coronary artery.  EF 78-29%, diastolic dysfunction, LVEDP 35. Underwent HD on 11/20 with 4 L volume removed.  Saturation improved after dialysis (100% on RA) Fevers noted 11/20. Blood cultures grew MSSA, noted this evening. .    This evening developed Afib with RVR 180s. Adenosine given, without improvement  Amiodarone infusion started per cardiology, 150mg  bolus, then drip.  Lopressor 5mg IV and 500ccNS given. Started on Phenylephrine, was on 258mcg on my arrival.   Cardioverted emergently at 120J for ongoing afib with hypotension (60/40) and AMS.  Converted to sinus.  Remained moderately hypotensive (MAP 60) on phenylephrine.   Patient was given one dose zosyn, switched to ancef once cultures grew back MSSA.   Cardiac stress tests annually, last done 11/16/19.    Past Medical History  HTN ESRD, HD MWF  Significant Hospital Events   Cardioversion 11/21 Cardiac cath stent to LAD 11/20  Consults:  PCCM  Nephrology  Procedures:  Central line 11/21  Arterial line 11/21 Repeat DCCV 11/23 11/23 ETT HD line 11/24 Arterial line 11/24  Significant Diagnostic Tests:  11/24 TEE>>Left ventricular ejection fraction, by estimation, is 35 to 40%. The  left ventricle has moderately decreased function.Moderate to severe mitral  regurgitation. 11/23 CT Chest>>Extensive multifocal nodular and patchy airspace disease in both lungs with a slight peripheral predominance in the upper lungs. 3.1 cm nodular consolidative opacity in the right upper lobe is cavitated. Imaging features likely related to multifocal pneumonia. Septic emboli and metastatic disease considered less likely but not excluded. 11/22 LUE Vas Upper Extremity Doppler>> Arteriovenous fistula-Aneurysmal dilatation noted. 11/24 CT abdomen - no retroperitoneal hematoma  Micro Data:  11/21 BCx2>>Staph aureus > MSSA by BCID 11/24 BCx2>>  Antimicrobials:  Zosyn 11/21 x 1 Ancef 11/21 ->  Interim history/subjective:  Atrial fibrillation controlled on Amiodarone, Hgb down-trending overnight, some dark/coffee ground appearing mucous suctioned from OG and ETT  Objective   Blood pressure (!) 119/52, pulse 81, temperature (!) 97.3 F (36.3 C), resp. rate 18, height 5\' 11"  (1.803 m), weight 75.8 kg, SpO2 100 %.    Vent Mode: PRVC FiO2 (%):  [40 %] 40 % Set Rate:  [15 bmp-16 bmp] 16 bmp Vt Set:  [600 mL] 600 mL PEEP:  [5 cmH20] 5 cmH20 Plateau Pressure:  [15 cmH20-19 cmH20] 15 cmH20   Intake/Output Summary (Last 24 hours) at 11/06/2020 1139 Last data filed at 11/06/2020 1100 Gross per 24 hour  Intake 3482.66 ml  Output 3336 ml  Net 146.66 ml   Filed Weights   11/04/20 0500 11/05/20 0500 11/06/20 0500  Weight: 76 kg 76 kg 75.8 kg   General:  well-nourished M, intubated and sedated HEENT: MM pink/moist, ETT in place, minimal dark mucous suctioned from ETT and OG, not coffee ground appearing presently Neuro: unresponsive on Dilaudid, not triggering vent, +gag and  corneal reflex, pupils 65mm and responsive bilaterally  CV: s1s2 rrr, no m/r/g PULM:  Bronchial breath sounds bilateral lower lobes, on full vent support, no wheezing or rhonchi GI: soft, bsx4 active  Extremities: warm/dry, no edema  Skin: no rashes or lesions   Resolved Hospital Problem  list     Assessment & Plan:   Anterior STEMI, CAD, acute CHF exacerbation.  S/P PCI-DES pf LAD Cardiology primary, on Heparin, brillinta, asa.   P: -multi-vessel disease that will  eventually need a return to cath lab for staged PCI  -long term plan Warfarin/Plavix -Volume overload managed with CRRT, -1400cc since admission   Afib now cardioverted to sinus rhythm on amiodarone following DCCV P: -Continue IV amiodarone, decrease to maintenance dose today -Monitor, decreased Levophed in favor of Vasopressin, maintain MAP >65    Severe sepsis with septic shock due to MSSA bacteremia requiring titration of vasopressors, bacteremia likely from HD access.  No evidence of fistula or abscess.    Cavitations on CT chest, concern for septic emboli.  No clear vegetation on TEE P: -Continue Ancef, will need 6 weeks IV, repeat BC without growth thus far -WBC remains markedly elevated at 38K -Pt encephalopathic today, ID recommending MRI spine and brain -maintain pressors to keep MAP greater than 65  Acute hypoxic respiratory failure due to MSSA pneumonia with cavitation. Stable infiltrates on CXR today P: -continue Ancef  -Chest physiotherapy -Have initiated SBT's.  -Mental status precludes extubation at this time --Maintain full vent support with SAT/SBT as tolerated -titrate Vent setting to maintain SpO2 greater than or equal to 90%. -HOB elevated 30 degrees. -Plateau pressures less than 30 cm H20.  -Follow chest x-ray, ABG prn.   -Bronchial hygiene and RT/bronchodilator protocol.    Acute toxic metabolic encephalopathy with agitation requiring titration of IV sedatives.  Less responsive today, decreasing Dilaudid, propofol off P: -MRI brain -Continue as needed lorazepam   Acute Normocytic Anemia With dark fluid and reported coffee ground material overnight suctioned from OG and NG tube P: -Repeat CBC 6.9, down from 9.5 on admission transfuse 1 unit PRBC's -Start protonix  40mg  bid -monitor h/h and for signs of UGIB, may need GI consult -hold heparin for now, monitor and may need to stop Brilinta and Asa    Hyponatremia - nephrology following, last HD 11/22 - Trend BMET  ESRD:  -Continue CRRT, nephrology following -Start fluid removal net -50 mL/h.   Best practice:  Diet: Continue tube feeding Pain/Anxiety/Delirium protocol (if indicated): hydromorphone infusion to RASS -1, have added lorazepam IV as needed to minimize propofol use.  On enteral Seroquel VAP protocol (if indicated): Bundle in place DVT prophylaxis: on heparin gtt GI prophylaxis: protonix Glucose control: Phase 1 glycemic control Mobility: Bedrest Code Status: Full  Family Communication: Spoke with wife  at bedside 11/29 Disposition: ICU  Labs   CBC: Recent Labs  Lab 11/02/20 2352 11/03/20 0446 11/04/20 0429 11/05/20 0246 11/06/20 0205  WBC 37.5* 37.3* 30.5* 40.3* 38.5*  HGB 8.0* 7.8* 7.4* 8.1* 7.0*  HCT 24.0* 23.8* 22.5* 25.5* 22.2*  MCV 97.6 97.5 99.1 98.8 100.0  PLT 355 339 304 377 932    Basic Metabolic Panel: Recent Labs  Lab 11/03/20 1532 11/03/20 1540 11/04/20 0331 11/04/20 0331 11/04/20 1520 11/05/20 0246 11/05/20 1002 11/05/20 1736 11/06/20 0205  NA  --    < > 135   < > 134* 134* 135 135 135  K  --    < > 4.0   < >  3.6 3.2* 3.6 4.1 4.3  CL  --    < > 97*   < > 95* 93* 96* 98 98  CO2  --    < > 25   < > 26 25 24 24 22   GLUCOSE  --    < > 118*   < > 139* 146* 151* 133* 162*  BUN  --    < > 29*   < > 34* 42* 45* 50* 52*  CREATININE  --    < > 3.28*   < > 2.92* 2.84* 2.85* 2.84* 2.87*  CALCIUM  --    < > 9.5   < > 9.4 9.2 8.9 9.0 9.1  MG 3.0*  --  2.9*  --  2.8* 2.8*  --   --  2.7*  PHOS 6.0*   < > 4.6  --  3.1 2.7  2.7  --  2.9 3.8   < > = values in this interval not displayed.   GFR: Estimated Creatinine Clearance: 29.9 mL/min (A) (by C-G formula based on SCr of 2.87 mg/dL (H)). Recent Labs  Lab 10/31/20 1327 11/01/20 0412 11/01/20 0430  11/01/20 0625 11/01/20 2045 11/03/20 0446 11/04/20 0429 11/05/20 0246 11/06/20 0205  WBC  --   --    < >  --    < > 37.3* 30.5* 40.3* 38.5*  LATICACIDVEN 2.5* 5.5*  --  2.1*  --   --   --   --   --    < > = values in this interval not displayed.    Liver Function Tests: Recent Labs  Lab 11/04/20 0331 11/04/20 1520 11/05/20 0246 11/05/20 1736 11/06/20 0205  ALBUMIN 1.6* 1.7* 1.6* 1.6* 1.5*   No results for input(s): LIPASE, AMYLASE in the last 168 hours. No results for input(s): AMMONIA in the last 168 hours.  ABG    Component Value Date/Time   PHART 7.295 (L) 11/01/2020 0336   PCO2ART 39.9 11/01/2020 0336   PO2ART 71 (L) 11/01/2020 0336   HCO3 19.4 (L) 11/01/2020 0336   TCO2 21 (L) 11/01/2020 0336   ACIDBASEDEF 7.0 (H) 11/01/2020 0336   O2SAT 82.0 11/01/2020 1130     Coagulation Profile: No results for input(s): INR, PROTIME in the last 168 hours.  Cardiac Enzymes: No results for input(s): CKTOTAL, CKMB, CKMBINDEX, TROPONINI in the last 168 hours.  HbA1C: Hgb A1c MFr Bld  Date/Time Value Ref Range Status  10/28/2020 06:04 AM 4.3 (L) 4.8 - 5.6 % Final    Comment:    (NOTE) Pre diabetes:          5.7%-6.4%  Diabetes:              >6.4%  Glycemic control for   <7.0% adults with diabetes   01/30/2015 09:09 PM 5.1 4.8 - 5.6 % Final    Comment:    (NOTE)         Pre-diabetes: 5.7 - 6.4         Diabetes: >6.4         Glycemic control for adults with diabetes: <7.0     CBG: Recent Labs  Lab 11/05/20 1946 11/05/20 2333 11/06/20 0350 11/06/20 0805 11/06/20 1124  GLUCAP 132* 127* 113* 119* 112*    Critical care time: 35 minutes    CRITICAL CARE Performed by: Otilio Carpen [place]   Total critical care time: 35 minutes  Critical care time was exclusive of separately billable procedures and treating other patients.  Critical care  was necessary to treat or prevent imminent or life-threatening deterioration.  Critical care was time spent personally  by me on the following activities: development of treatment plan with patient and/or surrogate as well as nursing, discussions with consultants, evaluation of patient's response to treatment, examination of patient, obtaining history from patient or surrogate, ordering and performing treatments and interventions, ordering and review of laboratory studies, ordering and review of radiographic studies, pulse oximetry and re-evaluation of patient's condition.   Otilio Carpen Mehreen Azizi, PA-C [facility]  Pager# [phone], if no answer [phone]

## 2020-11-06 NOTE — Progress Notes (Signed)
NAME:  Travis [name], MRN:  [id number], DOB:  10-18-1961, LOS: 9 ADMISSION DATE:  10/28/2020, CONSULTATION DATE:  10/29/20 REFERRING MD:  Cardiology - [name], CHIEF COMPLAINT:  Hypotension, gram positive cocci on culture, AMS  Brief History   Travis [name] is a 59 year old man with a history of HTN, ESRD on HD MWF, RUE AV fistula, hx of smoking, admitted 10/28/20 with fevers, myalgias for several days, acute chest pain.    History of present illness   On Admission, found to have STEMI, multivessel disease. Pulm edema with mild hypoxemia.  S/Post PCI to the LAD on 10/29/19. Planning for staged intervention to the left circumflex and possibly right coronary artery.  EF 67-59%, diastolic dysfunction, LVEDP 35. Underwent HD on 11/20 with 4 L volume removed.  Saturation improved after dialysis (100% on RA) Fevers noted 11/20. Blood cultures grew MSSA, noted this evening. .    This evening developed Afib with RVR 180s. Adenosine given, without improvement  Amiodarone infusion started per cardiology, 150mg  bolus, then drip.  Lopressor 5mg IV and 500ccNS given. Started on Phenylephrine, was on 26mcg on my arrival.   Cardioverted emergently at 120J for ongoing afib with hypotension (60/40) and AMS.  Converted to sinus.  Remained moderately hypotensive (MAP 60) on phenylephrine.   Patient was given one dose zosyn, switched to ancef once cultures grew back MSSA.   Cardiac stress tests annually, last done 11/16/19.    Past Medical History  HTN ESRD, HD MWF  Significant Hospital Events   Cardioversion 11/21 Cardiac cath stent to LAD 11/20  Consults:  PCCM  Nephrology  Procedures:  Central line 11/21  Arterial line 11/21 Repeat DCCV 11/23 HD line 11/24 Arterial line 11/24  Significant Diagnostic Tests:  11/23 Echo Pending 11/22 LUE Vas Upper Extremity Doppler>> Arteriovenous fistula-Aneurysmal dilatation noted. 11/24 CT abdomen - no retroperitoneal hematoma 11/24 transthoracic  echocardiogram-severe LV dysfunction EF 35% with restrictive diastolic function severe inferior wall motion abnormalities.  Moderate to severe mitral regurgitation. MRI brain> no septic emboli, chronic mild white matter ischemic disease MRI c-spine> trace effusions in facets C3-T1. Hypointense marrow signal T1 likely related to ESRD. MRI T-spine> no evidence of spine OM, incompletely imaged 32mm liver lesion MRI L-spine> facet effusions, mild surrounding marrow edema. Likely cholecystolithiasis. Micro Data:  11/21 Staph aureus > MSSA by BCID >>> 11/22 Cultures negative.  Antimicrobials:  Zosyn 11/21 x 1 Ancef 11/21 ->  Interim history/subjective:  Not moving his legs, unsure when that started. His wife has only seen his arms move since intubation.  Objective   Blood pressure (!) 119/52, pulse 81, temperature (!) 97.2 F (36.2 C), resp. rate 18, height 5\' 11"  (1.803 m), weight 75.8 kg, SpO2 100 %.    Vent Mode: PRVC FiO2 (%):  [40 %] 40 % Set Rate:  [15 bmp-16 bmp] 16 bmp Vt Set:  [600 mL] 600 mL PEEP:  [5 cmH20] 5 cmH20 Plateau Pressure:  [15 cmH20-19 cmH20] 15 cmH20   Intake/Output Summary (Last 24 hours) at 11/06/2020 1111 Last data filed at 11/06/2020 1100 Gross per 24 hour  Intake 3482.66 ml  Output 3336 ml  Net 146.66 ml   Filed Weights   11/04/20 0500 11/05/20 0500 11/06/20 0500  Weight: 76 kg 76 kg 75.8 kg    Examination:  General: Critically ill appearing man in NAD, intubated and sedated. HENT: [place]/AT, eyes anicteric, ETT in place. Lungs: breathing synchronously with the vent, CTAB Cardiovascular: RRR, no murmurs Abdomen: soft, NT, ND Extremities: no  c/c/e.  Neuro: RASS -5, not withdrawing in any extremity. Pinpoint pupils.   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 -Continue full ventilatory support -Con't CPT -con't LTVV, 4-8cc/kg IBW with goal Pplat<30 and DP<15 -Mental status precludes extubation at this time- needs sedation for MRIs. Daily SAT & SBT when appropriate -VAP prevention protocol.  Acute metabolic encephalopathy with agitation requiring titration of IV sedatives.  Agitation has been his major barrier to extubation. -Remains on low-dose Dilaudid; Propofol has been discontinued previously. -Continue as needed lorazepam  MSSA bacteremia likely from HD access.  No evidence of fistula or abscess.  Or endocarditis on TEE -Complete 6 weeks of IV cefazolin. Appreciate ID's recommendations.  Worsening leukocytosis- concerned for uncontrolled source. -MRIs  Afib now cardioverted to sinus rhythm on amiodarone following DCCV -Continue IV amiodarone -con't heparin gtt  STEMI/CAD. On Heparin, brillinta, asa.   -Will eventually need a return to cath lab for staged PCI  Hyponatremia - nephrology following, CRRT today. - con't to monitor  ESRD:  -Continue CRRT per Nephro's recommendations. -renally dose meds, avoid nephrotoxic meds   Best practice:  Diet: Continue tube feeding Pain/Anxiety/Delirium protocol (if indicated): hydromorphone infusion to RASS -1, have added lorazepam IV as needed to minimize propofol use.  On enteral Seroquel VAP protocol (if indicated): Bundle in place DVT prophylaxis: on heparin gtt GI prophylaxis: famotidine Glucose control: Phase 1 glycemic control Mobility: Bedrest Code Status: Full  Family Communication: Spoke with wife at bedside and son via videochat 11/29 Disposition: ICU  Labs   CBC: Recent Labs  Lab 11/02/20 2352 11/03/20 0446 11/04/20 0429 11/05/20 0246 11/06/20 0205  WBC 37.5* 37.3* 30.5* 40.3* 38.5*  HGB 8.0* 7.8* 7.4* 8.1* 7.0*  HCT  24.0* 23.8* 22.5* 25.5* 22.2*  MCV 97.6 97.5 99.1 98.8 100.0  PLT 355 339 304 377 570    Basic Metabolic Panel: Recent Labs  Lab 11/03/20 1532 11/03/20 1540 11/04/20 0331 11/04/20 0331 11/04/20 1520 11/05/20 0246 11/05/20 1002 11/05/20 1736 11/06/20 0205  NA  --    < > 135   < > 134* 134* 135 135 135  K  --    < > 4.0   < > 3.6 3.2* 3.6 4.1 4.3  CL  --    < > 97*   < > 95* 93* 96* 98 98  CO2  --    < > 25   < > 26 25 24 24 22   GLUCOSE  --    < > 118*   < > 139* 146* 151* 133* 162*  BUN  --    < > 29*   < > 34* 42* 45* 50* 52*  CREATININE  --    < > 3.28*   < > 2.92* 2.84* 2.85* 2.84* 2.87*  CALCIUM  --    < > 9.5   < > 9.4 9.2 8.9 9.0 9.1  MG 3.0*  --  2.9*  --  2.8* 2.8*  --   --  2.7*  PHOS 6.0*   < > 4.6  --  3.1 2.7  2.7  --  2.9 3.8   < > = values in this  interval not displayed.   GFR: Estimated Creatinine Clearance: 29.9 mL/min (A) (by C-G formula based on SCr of 2.87 mg/dL (H)). Recent Labs  Lab 10/31/20 1327 11/01/20 0412 11/01/20 0430 11/01/20 0625 11/01/20 2045 11/03/20 0446 11/04/20 0429 11/05/20 0246 11/06/20 0205  WBC  --   --    < >  --    < > 37.3* 30.5* 40.3* 38.5*  LATICACIDVEN 2.5* 5.5*  --  2.1*  --   --   --   --   --    < > = values in this interval not displayed.    Liver Function Tests: Recent Labs  Lab 11/04/20 0331 11/04/20 1520 11/05/20 0246 11/05/20 1736 11/06/20 0205  ALBUMIN 1.6* 1.7* 1.6* 1.6* 1.5*   No results for input(s): LIPASE, AMYLASE in the last 168 hours. No results for input(s): AMMONIA in the last 168 hours.  ABG    Component Value Date/Time   PHART 7.295 (L) 11/01/2020 0336   PCO2ART 39.9 11/01/2020 0336   PO2ART 71 (L) 11/01/2020 0336   HCO3 19.4 (L) 11/01/2020 0336   TCO2 21 (L) 11/01/2020 0336   ACIDBASEDEF 7.0 (H) 11/01/2020 0336   O2SAT 82.0 11/01/2020 1130     Coagulation Profile: No results for input(s): INR, PROTIME in the last 168 hours.  Cardiac Enzymes: No results for input(s): CKTOTAL,  CKMB, CKMBINDEX, TROPONINI in the last 168 hours.  HbA1C: Hgb A1c MFr Bld  Date/Time Value Ref Range Status  10/28/2020 06:04 AM 4.3 (L) 4.8 - 5.6 % Final    Comment:    (NOTE) Pre diabetes:          5.7%-6.4%  Diabetes:              >6.4%  Glycemic control for   <7.0% adults with diabetes   01/30/2015 09:09 PM 5.1 4.8 - 5.6 % Final    Comment:    (NOTE)         Pre-diabetes: 5.7 - 6.4         Diabetes: >6.4         Glycemic control for adults with diabetes: <7.0     CBG: Recent Labs  Lab 11/05/20 1547 11/05/20 1946 11/05/20 2333 11/06/20 0350 11/06/20 0805  GLUCAP 135* 132* 127* 113* 119*     This patient is critically ill with multiple organ system failure which requires frequent high complexity decision making, assessment, support, evaluation, and titration of therapies. This was completed through the application of advanced monitoring technologies and extensive interpretation of multiple databases. During this encounter critical care time was devoted to patient care services described in this note for 47 minutes.  Julian Hy, DO 11/06/20 6:10 PM [facility]

## 2020-11-06 NOTE — Progress Notes (Addendum)
ANTICOAGULATION CONSULT NOTE  Addendum: Coffee ground material noted in patient's suction material. Reordering CBC at 1100 will follow up plan for continued anticoagulation at that time.   Pharmacy Consult for Heparin Indication: chest pain/ACS with no intervention  Patient Measurements: Height: 5\' 11"  (180.3 cm) Weight: 75.8 kg (167 lb 1.7 oz) IBW/kg (Calculated) : 75.3 Heparin Dosing Weight: 86.4 kg   Vital Signs: Temp: 97.7 F (36.5 C) (11/29 0700) Temp Source: Esophageal (11/29 0400) BP: 119/52 (11/29 0302) Pulse Rate: 79 (11/29 0700)  Labs: Recent Labs    11/04/20 0429 11/04/20 1035 11/05/20 0246 11/05/20 0246 11/05/20 1002 11/05/20 1230 11/05/20 1736 11/06/20 0205  HGB 7.4*  --  8.1*  --   --   --   --  7.0*  HCT 22.5*  --  25.5*  --   --   --   --  22.2*  PLT 304  --  377  --   --   --   --  290  HEPARINUNFRC  --    < > 0.21*  --   --  0.24*  --  0.50  CREATININE  --    < > 2.84*   < > 2.85*  --  2.84* 2.87*   < > = values in this interval not displayed.    Estimated Creatinine Clearance: 29.9 mL/min (A) (by C-G formula based on SCr of 2.87 mg/dL (H)).  Assessment: 2 yom presenting with STEMI. Known to have dilated ascending aorta, ESRD. Found to have 99% mid LAD with multivessel in LCx and RCA, now s/p DES to LAD. Heparin was started post sheath removal. Patient was found to have bacteremia and will not proceed with cath intervention at this time. Patient developed Afib during admission treated with amiodarone and successful cardioversion.   Heparin level this AM is therapeutic at 0.50 after increasing drip rate to 1800 units/hr. No bleeding or infusion issues. Hgb has dropped this AM to 7.0 as patient has had variable CBCs throughout this admission, plts 290. Remains on CRRT.   Goal of Therapy:  Heparin level 0.3-0.7 units/mL  Monitor platelets by anticoagulation protocol: Yes   Plan:  Continue heparin infusion at 1800 units/hr. Monitor daily HL, CBC,  s/sx bleeding  Cephus Slater, PharmD, [facility] Pharmacy Resident [phone] 11/06/2020 7:22 AM  Please check [email or web address] for unit-specific pharmacy phone numbers.

## 2020-11-06 NOTE — Progress Notes (Signed)
[name] for Infectious Disease  Date of Admission:  10/28/2020           Current antibiotics: Total days of antibiotics: 8                                                                                Current antibiotics: 11/22-c cefazolin  11/21 vanc/piptazo  Reason for visit: Follow up on MSSA bacteremia  ASSESSMENT/PLAN:    Travis [name] is a 59 y.o. male with past medical history of hypertension, end-stage renal disease on intermittent hemodialysis, coronary artery disease, admitted with sepsis and anterior wall STEMI status post emergent PCI.  Found to have septic shock post procedure and MSSA bacteremia.  Repeat blood cultures are negative and transesophageal echo without definite evidence of vegetations (AV echodensities noted that could be consistent with calcification in a patient with end-stage renal disease).  CT chest from 11/23 with findings of pneumonia and sputum cultures with Staph aureus.   Travis [name] 11/27 without abscess at AV fistula site.   CT abd/pelvis 11/24 without significant findings for infection.    Interval 24hrs: -- low grade fevers overnight -- WBC slightly improved  #MSSA bacteremia #Pneumonia #Leukocytosis #ESRD #STEMI status post PCI  --Continue renally dosed cefazolin --Fevers and leukocytosis concerning in setting of MSSA.  Also TEE did note AV echodensity  --Will check MRI spine and brain today --Repeat cultures --Femoral line exchange if able   MEDICATIONS:    Scheduled Meds: . sodium chloride   Intravenous Once  . sodium chloride   Intravenous Once  . sodium chloride   Intravenous Once  . aspirin  81 mg Per Tube Daily  . atorvastatin  80 mg Per Tube q1800  . B-complex with vitamin C  1 tablet Per Tube Daily  . chlorhexidine gluconate (MEDLINE KIT)  15 mL Mouth Rinse BID  . Chlorhexidine Gluconate Cloth  6 each Topical Q0600  . clonazepam  1 mg Per Tube BID  . docusate  100 mg Per Tube BID  . doxercalciferol  5  mcg Intravenous Q M,W,F  . feeding supplement (PROSource TF)  45 mL Per Tube QID  . insulin aspart  0-6 Units Subcutaneous Q4H  . lidocaine  1 patch Transdermal Q24H  . mouth rinse  15 mL Mouth Rinse 10 times per day  . pantoprazole (PROTONIX) IV  40 mg Intravenous Q12H  . polyethylene glycol  17 g Per Tube Daily  . QUEtiapine  100 mg Per Tube BID  . senna  1 tablet Per Tube QHS  . sevelamer carbonate  0.8 g Per Tube TID  . sodium chloride flush  10-40 mL Intracatheter Q12H  . sodium chloride flush  3 mL Intravenous Q12H  . Thrombi-Pad  1 each Topical Once  . ticagrelor  90 mg Per Tube BID    Continuous Infusions: . sodium chloride Stopped (11/03/20 2145)  . sodium chloride Stopped (10/28/20 0954)  . sodium chloride Stopped (11/04/20 0016)  . amiodarone 30 mg/hr (11/06/20 1648)  .  ceFAZolin (ANCEF) IV Stopped (11/06/20 0849)  . feeding supplement (VITAL AF 1.2 CAL) 1,000 mL (11/04/20 1528)  . heparin 1,800 Units/hr (11/06/20 1647)  .  HYDROmorphone Stopped (11/06/20 1435)  . norepinephrine (LEVOPHED) Adult infusion Stopped (11/06/20 1403)  . propofol (DIPRIVAN) infusion Stopped (11/05/20 0908)  . vasopressin Stopped (11/06/20 1432)    PRN Meds: sodium chloride, acetaminophen, bisacodyl, HYDROmorphone, HYDROmorphone (DILAUDID) injection, HYDROmorphone (DILAUDID) injection, LORazepam, midazolam, nitroGLYCERIN, ondansetron (ZOFRAN) IV, sodium chloride flush, sodium chloride flush  SUBJECTIVE:   Unable to obtain due to being intubated and sedated.    OBJECTIVE:   Allergies  Allergen Reactions  . Ibuprofen Hives  . Lisinopril Swelling    PT states he is allergic to all prils; caused facial swelling  . Naproxen Hives and Other (See Comments)    Alleve causes patient to have hives    Blood pressure (!) 119/52, pulse 81, temperature 97.7 F (36.5 C), resp. rate 16, height _0  (1.803 m), weight 75.8 kg, SpO2 100 %. Body mass index is 23.31 kg/m.  Physical  Exam  General: Patient is currently intubated and sedated on the ventilator. Wife is present.  Appears comfortable Head: Normocephalic and atraumatic.  ET tube in place Pulmonary/Chest: Breathing assisted on vent; symmetric chest rise and fall. Musculoskeletal: No joint deformities Neurological: Sedated Skin: Warm, dry and intact. No rashes or erythema.  HD cath Right IJ.  Left femoral CVC.  Lab Results & Microbiology Lab Results  Component Value Date   WBC 38.5 (H) 11/06/2020   HGB 6.9 (LL) 11/06/2020   HCT 22.4 (L) 11/06/2020   MCV 100.0 11/06/2020   PLT 290 11/06/2020    Lab Results  Component Value Date   NA 135 11/06/2020   K 4.3 11/06/2020   CO2 22 11/06/2020   GLUCOSE 162 (H) 11/06/2020   BUN 52 (H) 11/06/2020   CREATININE 2.87 (H) 11/06/2020   CALCIUM 9.1 11/06/2020   GFRNONAA 25 (L) 11/06/2020   GFRAA 6 (L) 02/26/2018    Lab Results  Component Value Date   ALT 51 (H) 10/30/2020   AST 87 (H) 10/30/2020   ALKPHOS 95 10/30/2020   BILITOT 1.5 (H) 10/30/2020     I have reviewed the micro and lab results in Epic.  Imaging MR BRAIN WO CONTRAST  Result Date: 11/06/2020 CLINICAL DATA:  History media, concern for septic emboli. EXAM: MRI HEAD WITHOUT CONTRAST TECHNIQUE: Multiplanar, multiecho pulse sequences of the brain and surrounding structures were obtained without intravenous contrast. COMPARISON:  Brain MRI/MRA 11/13/2012. FINDINGS: Brain: Cerebral volume is normal. Minimal multifocal T2/FLAIR hyperintensity within the cerebral white matter is nonspecific, but compatible with chronic small vessel ischemic disease. There is no acute infarct. No evidence of intracranial mass. No chronic intracranial blood products. No extra-axial fluid collection. No midline shift. Vascular: Expected proximal arterial flow voids. Skull and upper cervical spine: No focal suspicious marrow lesion. Sinuses/Orbits: Visualized orbits show no acute finding. Mild ethmoid and left sphenoid  sinus mucosal thickening. Small volume frothy secretions are also present within the left sphenoid sinus. Other: Bilateral mastoid effusions. Partially visualized support tubes. Secretions within the nasopharynx and oropharynx. IMPRESSION: No non-contrast evidence of acute intracranial abnormality. Mild cerebral white matter chronic small vessel ischemic disease. Ethmoid and left sphenoid sinusitis. Bilateral mastoid effusions. Electronically Signed   By: Kellie Simmering DO   On: 11/06/2020 16:23   MR CERVICAL SPINE WO CONTRAST  Result Date: 11/06/2020 CLINICAL DATA:  Neck pain, acute, infection suspected; bacteremia, not moving lower extremities. EXAM: MRI CERVICAL SPINE WITHOUT CONTRAST TECHNIQUE: Multiplanar, multisequence MR imaging of the cervical spine was performed. No intravenous contrast was administered. COMPARISON:  No pertinent prior exams  available for comparison. FINDINGS: Alignment: No significant spondylolisthesis. Vertebrae: Diffuse abnormal hypointense marrow signal throughout the cervical and visualized upper thoracic spine, nonspecific but likely related to the patient's known end-stage renal disease. Trace edema along the C6 inferior endplate is nonspecific, but likely degenerative. No significant marrow edema is identified elsewhere within the cervical spine. Cord: No spinal cord signal abnormality Posterior Fossa, vertebral arteries, paraspinal tissues: Posterior fossa better assessed on the concurrently performed brain MRI. Flow voids preserved within the imaged cervical vertebral arteries. Partially imaged life support tubes. Prominent secretions within the nasopharynx and oropharynx. Paraspinal soft tissues unremarkable. Disc levels: Mild multilevel disc degeneration greatest at C6-C7. C2-C3: Disc osteophyte ridge/uncinate hypertrophy on the right. Minimal partial effacement of the ventral thecal sac on the right without significant central canal stenosis. Mild right neural foraminal  narrowing. C3-C4: Mild uncinate and facet hypertrophy on the right. No significant disc herniation or spinal canal stenosis. Mild right neural foraminal narrowing. Trace left facet joint effusion (series 8, image 16). C4-C5: Uncinate hypertrophy (greater on the right). Minimal facet hypertrophy. No significant disc herniation or spinal canal stenosis. Moderate/severe neural foraminal narrowing. Small left facet joint effusion (series 8, image 21). C5-C6: Left center disc protrusion (series 9, image 26). Uncinate hypertrophy (greater on the right). Minimal facet hypertrophy. The disc protrusion contributes to mild relative spinal canal narrowing. Moderate right neural foraminal narrowing. Trace left facet joint effusion (series 8, image 25) C6-C7: Shallow broad-based left center disc protrusion. Mild uncovertebral and facet hypertrophy. No significant spinal canal stenosis. Bilateral neural foraminal narrowing (moderate right, mild left). Small left facet joint effusion (series 8, image 30). C7-T1: No significant disc herniation or stenosis. Trace left facet joint effusion. IMPRESSION: Trace or small facet joint effusions on the left at C3-C4, C4-C5, C5-C6, C6-C7 and C7-T1 are nonspecific and may be degenerative in etiology. Given the provided history of bacteremia, facet joint septic arthritis is difficult to definitively exclude, but the lack of any surrounding marrow edema argues against this. Minimal edema along the C6 inferior endplate is likely degenerative. Cervical spondylosis as outline. At C5-C6, a left center disc protrusion contributes to mild relative spinal canal narrowing. No significant canal stenosis at the remaining levels. Multilevel neural foraminal narrowing greatest on the right at C4-C5 (moderate/severe), on the right at C5-C6 (moderate) and on the right at C6-C7 (moderate). Diffuse abnormal T1 hypointense marrow signal, nonspecific but likely related to the patient's end-stage renal disease.  Electronically Signed   By: Kellie Simmering DO   On: 11/06/2020 16:39   MR THORACIC SPINE WO CONTRAST  Result Date: 11/06/2020 CLINICAL DATA:  Mid back pain; bacteremia, not moving lower extremity. EXAM: MRI THORACIC SPINE WITHOUT CONTRAST TECHNIQUE: Multiplanar, multisequence MR imaging of the thoracic spine was performed. No intravenous contrast was administered. COMPARISON:  CT chest 10/31/2020. FINDINGS: Mildly motion degraded examination. Alignment: Thoracic levocurvature. No significant spondylolisthesis. Vertebrae: Vertebral body height is maintained. Multilevel degenerative endplate irregularity and small Schmorl nodes. Mild edema along the T6 inferior endplate, at site of a Schmorl node, likely degenerative. No significant marrow edema identified elsewhere within the thoracic spine. Diffuse hypointense marrow signal throughout the thoracic spine, nonspecific but likely related to the patient's end-stage renal disease. Cord: Within the limitations of motion degradation, there is no convincing spinal cord signal abnormality. Paraspinal and other soft tissues: Paraspinal soft tissues within normal limits. Incompletely assessed multifocal airspace disease and cavitary pulmonary lesions. Small right pleural effusion. 13 mm T2 hyperintense lesion within the hepatic dome, incompletely  assessed on this noncontrast examination. Disc levels: No more than mild disc degeneration at any level. No focal disc herniation, spinal canal stenosis or neural foraminal narrowing at any level. IMPRESSION: Mild edema along the T6 inferior endplate, at site of a Schmorl node, likely degenerative. No significant marrow edema elsewhere within the thoracic spine. Mild thoracic spondylosis. No focal disc herniation, spinal canal stenosis or neural foraminal narrowing at any level. Diffuse hypointense marrow signal throughout the thoracic spine, likely related to the patient's end-stage renal disease. Incompletely assessed multifocal  airspace disease and cavitary pulmonary lesions. Small right pleural effusion. 13 mm T2 hyperintense lesion within the hepatic dome, incompletely assessed on this non-contrast examination. Electronically Signed   By: Kellie Simmering DO   On: 11/06/2020 17:05   MR LUMBAR SPINE WO CONTRAST  Result Date: 11/06/2020 CLINICAL DATA:  Back pain, infection suspected; bacteremia, not moving legs. EXAM: MRI LUMBAR SPINE WITHOUT CONTRAST TECHNIQUE: Multiplanar, multisequence MR imaging of the lumbar spine was performed. No intravenous contrast was administered. COMPARISON:  CT abdomen/pelvis 11/01/2020. FINDINGS: Segmentation:  5 lumbar vertebrae. Alignment:  No significant spondylolisthesis. Vertebrae: Vertebral body height is maintained. Multilevel degenerative endplate irregularity with small Schmorl nodes. There is trace multilevel endplate edema, largely adjacent to Schmorl nodes, and findings are likely degenerative in etiology. No suspicious vertebral body marrow edema to suggest discitis/osteomyelitis on the current exam. Mild marrow edema along the L4-L5 and L5-S1 facet joints. Diffuse hypointense marrow signal throughout the lumbar spine nonspecific but likely related to the patient's end-stage renal disease. Conus medullaris and cauda equina: Conus extends to the L1 level. Conus and cauda equina appear normal. Paraspinal and other soft tissues: Layering hypointense signal within the gallbladder likely reflecting cholecystolithiasis. Nonspecific edema signal within the lumbar paraspinal soft tissues. Disc levels: No more than mild disc degeneration at any level. No significant disc herniation, spinal canal stenosis or neural foraminal narrowing at the T12-L1 through L3-L4 levels. L4-L5: Small disc bulge. Facet arthrosis with ligamentum flavum hypertrophy. Trace bilateral facet joint effusions. No significant spinal canal stenosis or neural foraminal narrowing. L5-S1: Disc bulge. Superimposed broad-based central  disc protrusion eccentric to the right at site of posterior annular fissure. Facet arthrosis with mild ligamentum flavum hypertrophy. Mild right subarticular narrowing with the disc protrusion likely contacting the descending right S1 nerve root. Central canal patent. Mild relative left neural foraminal narrowing. IMPRESSION: Trace bilateral L4-L5 facet joint effusions with mild surrounding marrow edema. Findings are nonspecific and could certainly be degenerative given the facet arthrosis present at this level. Facet joint septic arthrosis is difficult to definitively exclude given the provided history of bacteremia. Mild marrow edema along the bilateral L5-S1 facet joints is nonspecific, but favored degenerative given the significant facet arthrosis at this level and lack of facet joint effusions. Trace multilevel endplate edema appears degenerative. Lumbar spondylosis as outlined. No more than mild spinal canal or neural foraminal narrowing at any level. Most notably, and L5-S1 disc protrusion contributes to multifactorial mild right subarticular narrowing and contacts the descending right S1 nerve root. Diffuse hypointense marrow signal likely related to the patient's end-stage renal disease. Nonspecific edema within the bilateral lumbar paraspinal soft tissues. Suspected cholecystolithiasis. Electronically Signed   By: Kellie Simmering DO   On: 11/06/2020 16:53   DG Chest Port 1 View  Result Date: 11/06/2020 CLINICAL DATA:  Dialysis. EXAM: PORTABLE CHEST 1 VIEW COMPARISON:  November 04, 2020. FINDINGS: Stable cardiomediastinal silhouette. Endotracheal and feeding tubes are unchanged in position. Right internal jugular catheter  is unchanged. No pneumothorax is noted. Stable bibasilar atelectasis or infiltrates are noted, with probable small right pleural effusion. Bony thorax is unremarkable. IMPRESSION: Stable support apparatus. Stable bibasilar atelectasis or infiltrates are noted, with probable small right  pleural effusion. Electronically Signed   By: Marijo Conception M.D.   On: 11/06/2020 10:42   [name] LT UPPER EXTREM LTD SOFT TISSUE NON VASCULAR  Result Date: 11/04/2020 CLINICAL DATA:  Av fistula, arm swelling, query abscess. EXAM: ULTRASOUND left UPPER EXTREMITY LIMITED TECHNIQUE: Ultrasound examination of the upper extremity soft tissues was performed in the area of clinical concern. COMPARISON:  None. FINDINGS: No abnormal fluid collection or specific lesion is identified in the vicinity of the av fistula to suggest abscess or mass. IMPRESSION: No abscess or mass is identified. Electronically Signed   By: Van Clines M.D.   On: 11/04/2020 17:35      Raynelle Highland for [facility] [phone] pager 11/06/2020, 5:12 PM

## 2020-11-06 NOTE — Progress Notes (Signed)
[facility] Progress Note  Subjective: pt seen in ICU, on pressors x 2, weaning down sedation per RN this am.   Vitals:   11/06/20 1145 11/06/20 1200 11/06/20 1215 11/06/20 1230  BP:      Pulse:      Resp: 18 16 17 17   Temp: (!) 97.3 F (36.3 C) (!) 97.5 F (36.4 C) (!) 97.5 F (36.4 C) (!) 97.5 F (36.4 C)  TempSrc:      SpO2:      Weight:      Height:        Exam:   on vent ,sedated  no jvd  throat ett in place  Chest cta bilat and lat  Cor reg no RG  Abd soft ntnd no ascites   Ext no LE edema   Neuro on vent and sedated, not following commands     OP HD:AF MWF 4h 450/800 81.5kg 2/2.25 bath RUE AVF Hep 5000+ 1022mdrun - hect 5 ug tiw  - mircera 50 ug q 4, last 11/15 (due 11/29)    Assessment/ Plan: 1. Acute STEMI - SP LAD PCI11/20 am, has otherdisease inLCx/ RCA. Transitioned to brilinta from cangrelor. Per cardiology. 2. MSSA cavitary PNA / bacteremia-  intubated 11/23.  Per PCCM.  Respiratory culture 11/23 with MSSA. Blood cx +11/21. CT abd/ pelvis negative 11/23.  F/U bcx's negative. Dialysis duplex 11/22 without abscess and [place]F was negative 11/27. Plan for 6 weeks of IV cefazolin per pharmacy.     3. PEA arrest: occurred in setting of agitation/ sedation. Brief CPR.  4. ESRD - usual HD MWF. ^^LVEDP per initial LHC. Started CRRT 11/24. Now is 10kg down from admit wt and 7 kg under dry wt. Vol excess resolved on exam/ CXR. Will dc CRRT and see if BP's will improve some, if so can do regular HD. 5. Shock: on 2 pressors, per CCM 6. 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 Afib: on amio and hep gtt, s/p DCCV 9. Severe MR: per cardiology    Rob Yasseen Salls 11/06/2020, 12:40 PM   Recent Labs  Lab 11/05/20 0246 11/05/20 1736 11/06/20 0205 11/06/20 1041  K  --  4.1 4.3  --   BUN  --  50* 52*  --   CREATININE  --  2.84* 2.87*  --   CALCIUM  --  9.0 9.1  --    PHOS  --  2.9 3.8  --   HGB   < >  --  7.0* 6.9*   < > = values in this interval not displayed.   Inpatient medications:  sodium chloride   Intravenous Once   aspirin  81 mg Per Tube Daily   atorvastatin  80 mg Per Tube q1800   B-complex with vitamin C  1 tablet Per Tube Daily   chlorhexidine gluconate (MEDLINE KIT)  15 mL Mouth Rinse BID   Chlorhexidine Gluconate Cloth  6 each Topical Q0600   clonazepam  1 mg Per Tube BID   docusate  100 mg Per Tube BID   doxercalciferol  5 mcg Intravenous Q M,W,F   feeding supplement (PROSource TF)  45 mL Per Tube QID   insulin aspart  0-6 Units Subcutaneous Q4H   lidocaine  1 patch Transdermal Q24H   mouth rinse  15 mL Mouth Rinse 10 times per day   pantoprazole (PROTONIX) IV  40 mg  Intravenous Q12H   polyethylene glycol  17 g Per Tube Daily   QUEtiapine  100 mg Per Tube BID   senna  1 tablet Per Tube QHS   sevelamer carbonate  0.8 g Per Tube TID   sodium chloride flush  10-40 mL Intracatheter Q12H   sodium chloride flush  3 mL Intravenous Q12H   Thrombi-Pad  1 each Topical Once   ticagrelor  90 mg Per Tube BID     prismasol BGK 4/2.5 400 mL/hr at 11/06/20 0452    prismasol BGK 4/2.5 200 mL/hr at 11/05/20 1946   sodium chloride Stopped (11/03/20 2145)   sodium chloride Stopped (10/28/20 0954)   sodium chloride Stopped (11/04/20 0016)   amiodarone 30 mg/hr (11/06/20 1200)    ceFAZolin (ANCEF) IV Stopped (11/06/20 0849)   feeding supplement (VITAL AF 1.2 CAL) 1,000 mL (11/04/20 1528)   heparin 1,800 Units/hr (11/06/20 1210)   HYDROmorphone 2.5 mg/hr (11/06/20 1200)   norepinephrine (LEVOPHED) Adult infusion 4 mcg/min (11/06/20 1200)   prismasol BGK 4/2.5 2,000 mL/hr at 11/06/20 1031   propofol (DIPRIVAN) infusion Stopped (11/05/20 0908)   vasopressin 0.03 Units/min (11/06/20 1200)   sodium chloride, acetaminophen, heparin, heparin, HYDROmorphone, HYDROmorphone (DILAUDID) injection, HYDROmorphone  (DILAUDID) injection, LORazepam, midazolam, nitroGLYCERIN, ondansetron (ZOFRAN) IV, sodium chloride, sodium chloride flush, sodium chloride flush

## 2020-11-07 ENCOUNTER — Inpatient Hospital Stay (HOSPITAL_COMMUNITY): Payer: Medicare Other

## 2020-11-07 ENCOUNTER — Other Ambulatory Visit: Payer: Self-pay | Admitting: Acute Care

## 2020-11-07 DIAGNOSIS — A419 Sepsis, unspecified organism: Secondary | ICD-10-CM | POA: Diagnosis not present

## 2020-11-07 DIAGNOSIS — J15211 Pneumonia due to Methicillin susceptible Staphylococcus aureus: Secondary | ICD-10-CM | POA: Diagnosis not present

## 2020-11-07 DIAGNOSIS — I5041 Acute combined systolic (congestive) and diastolic (congestive) heart failure: Secondary | ICD-10-CM | POA: Diagnosis not present

## 2020-11-07 DIAGNOSIS — R6521 Severe sepsis with septic shock: Secondary | ICD-10-CM | POA: Diagnosis not present

## 2020-11-07 LAB — POCT I-STAT 7, (LYTES, BLD GAS, ICA,H+H)
Acid-base deficit: 2 mmol/L (ref 0.0–2.0)
Acid-base deficit: 2 mmol/L (ref 0.0–2.0)
Bicarbonate: 22.2 mmol/L (ref 20.0–28.0)
Bicarbonate: 22.3 mmol/L (ref 20.0–28.0)
Calcium, Ion: 1.3 mmol/L (ref 1.15–1.40)
Calcium, Ion: 1.29 mmol/L (ref 1.15–1.40)
HCT: 23 % — ABNORMAL LOW (ref 39.0–52.0)
HCT: 23 % — ABNORMAL LOW (ref 39.0–52.0)
Hemoglobin: 7.8 g/dL — ABNORMAL LOW (ref 13.0–17.0)
Hemoglobin: 7.8 g/dL — ABNORMAL LOW (ref 13.0–17.0)
O2 Saturation: 95 %
O2 Saturation: 90 %
Patient temperature: 98.8
Patient temperature: 98.4
Potassium: 5 mmol/L (ref 3.5–5.1)
Potassium: 5.2 mmol/L — ABNORMAL HIGH (ref 3.5–5.1)
Sodium: 134 mmol/L — ABNORMAL LOW (ref 135–145)
Sodium: 134 mmol/L — ABNORMAL LOW (ref 135–145)
TCO2: 23 mmol/L (ref 22–32)
TCO2: 23 mmol/L (ref 22–32)
pCO2 arterial: 35.8 mmHg (ref 32.0–48.0)
pCO2 arterial: 35.2 mmHg (ref 32.0–48.0)
pH, Arterial: 7.401 (ref 7.350–7.450)
pH, Arterial: 7.41 (ref 7.350–7.450)
pO2, Arterial: 77 mmHg — ABNORMAL LOW (ref 83.0–108.0)
pO2, Arterial: 58 mmHg — ABNORMAL LOW (ref 83.0–108.0)

## 2020-11-07 LAB — RENAL FUNCTION PANEL
Albumin: 1.4 g/dL — ABNORMAL LOW (ref 3.5–5.0)
Anion gap: 15 (ref 5–15)
BUN: 80 mg/dL — ABNORMAL HIGH (ref 6–20)
CO2: 21 mmol/L — ABNORMAL LOW (ref 22–32)
Calcium: 9.4 mg/dL (ref 8.9–10.3)
Chloride: 98 mmol/L (ref 98–111)
Creatinine, Ser: 4.81 mg/dL — ABNORMAL HIGH (ref 0.61–1.24)
GFR, Estimated: 13 mL/min — ABNORMAL LOW (ref >60)
Glucose, Bld: 144 mg/dL — ABNORMAL HIGH (ref 70–99)
Phosphorus: 5.2 mg/dL — ABNORMAL HIGH (ref 2.5–4.6)
Potassium: 4.7 mmol/L (ref 3.5–5.1)
Sodium: 134 mmol/L — ABNORMAL LOW (ref 135–145)

## 2020-11-07 LAB — CBC
HCT: 23.2 % — ABNORMAL LOW (ref 39.0–52.0)
Hemoglobin: 7.1 g/dL — ABNORMAL LOW (ref 13.0–17.0)
MCH: 31.3 pg (ref 26.0–34.0)
MCHC: 30.6 g/dL (ref 30.0–36.0)
MCV: 102.2 fL — ABNORMAL HIGH (ref 80.0–100.0)
Platelets: 246 10*3/uL (ref 150–400)
RBC: 2.27 MIL/uL — ABNORMAL LOW (ref 4.22–5.81)
RDW: 21.4 % — ABNORMAL HIGH (ref 11.5–15.5)
WBC: 28.4 10*3/uL — ABNORMAL HIGH (ref 4.0–10.5)
nRBC: 1.8 % — ABNORMAL HIGH (ref 0.0–0.2)

## 2020-11-07 LAB — BASIC METABOLIC PANEL
Anion gap: 16 — ABNORMAL HIGH (ref 5–15)
BUN: 96 mg/dL — ABNORMAL HIGH (ref 6–20)
CO2: 19 mmol/L — ABNORMAL LOW (ref 22–32)
Calcium: 9.2 mg/dL (ref 8.9–10.3)
Chloride: 98 mmol/L (ref 98–111)
Creatinine, Ser: 5.78 mg/dL — ABNORMAL HIGH (ref 0.61–1.24)
GFR, Estimated: 11 mL/min — ABNORMAL LOW (ref >60)
Glucose, Bld: 97 mg/dL (ref 70–99)
Potassium: 5.1 mmol/L (ref 3.5–5.1)
Sodium: 133 mmol/L — ABNORMAL LOW (ref 135–145)

## 2020-11-07 LAB — GLUCOSE, CAPILLARY
Glucose-Capillary: 142 mg/dL — ABNORMAL HIGH (ref 70–99)
Glucose-Capillary: 98 mg/dL (ref 70–99)
Glucose-Capillary: 91 mg/dL (ref 70–99)
Glucose-Capillary: 88 mg/dL (ref 70–99)

## 2020-11-07 LAB — HEPARIN LEVEL (UNFRACTIONATED)
Heparin Unfractionated: 0.15 IU/mL — ABNORMAL LOW (ref 0.30–0.70)
Heparin Unfractionated: 0.27 IU/mL — ABNORMAL LOW (ref 0.30–0.70)
Heparin Unfractionated: 0.6 IU/mL (ref 0.30–0.70)

## 2020-11-07 LAB — PATHOLOGIST SMEAR REVIEW

## 2020-11-07 LAB — PREPARE RBC (CROSSMATCH)

## 2020-11-07 LAB — MAGNESIUM: Magnesium: 2.8 mg/dL — ABNORMAL HIGH (ref 1.7–2.4)

## 2020-11-07 MED ORDER — SODIUM CHLORIDE 0.9% IV SOLUTION: Freq: Once | INTRAVENOUS | Status: AC

## 2020-11-07 MED ORDER — SODIUM CHLORIDE 0.9 % IV BOLUS: 1500.0000 mL | Freq: Once | INTRAVENOUS | Status: DC

## 2020-11-07 MED ORDER — BISACODYL 10 MG RE SUPP
10.0000 mg | Freq: Two times a day (BID) | RECTAL | Status: AC
  Administered 2020-11-07 – 2020-11-10 (×2): 10 mg via RECTAL
  Filled 2020-11-07 (×3): qty 1

## 2020-11-07 MED ORDER — SODIUM CHLORIDE 0.9 % IV SOLN: INTRAVENOUS | Status: DC

## 2020-11-07 MED ORDER — SODIUM CHLORIDE 0.9 % IV SOLN
0.7500 ug/kg/min | INTRAVENOUS | Status: DC
  Filled 2020-11-07: qty 50

## 2020-11-07 MED ORDER — IOHEXOL 300 MG/ML  SOLN
100.0000 mL | Freq: Once | INTRAMUSCULAR | Status: AC | PRN
  Administered 2020-11-07: 100 mL via INTRAVENOUS

## 2020-11-07 MED ORDER — SODIUM CHLORIDE 0.9 % IV BOLUS
2000.0000 mL | Freq: Once | INTRAVENOUS | Status: AC
  Administered 2020-11-07: 2000 mL via INTRAVENOUS

## 2020-11-07 MED ORDER — CEFAZOLIN SODIUM-DEXTROSE 1-4 GM/50ML-% IV SOLN
1.0000 g | INTRAVENOUS | Status: DC
  Filled 2020-11-07 (×2): qty 50

## 2020-11-07 MED ORDER — METOCLOPRAMIDE HCL 5 MG/ML IJ SOLN
10.0000 mg | Freq: Two times a day (BID) | INTRAMUSCULAR | Status: DC
  Administered 2020-11-07 – 2020-11-13 (×14): 10 mg via INTRAVENOUS
  Filled 2020-11-07 (×15): qty 2

## 2020-11-07 MED ORDER — SODIUM CHLORIDE 0.9 % IV SOLN
0.7500 ug/kg/min | INTRAVENOUS | Status: DC
  Administered 2020-11-08 – 2020-11-13 (×11): 0.75 ug/kg/min via INTRAVENOUS
  Filled 2020-11-07 (×16): qty 50

## 2020-11-07 NOTE — Progress Notes (Signed)
Patient transported to CT and back without complications. RN at bedside.  

## 2020-11-07 NOTE — Progress Notes (Signed)
ANTICOAGULATION CONSULT NOTE    Pharmacy Consult for Heparin Indication: chest pain/ACS with no intervention and Afib   Patient Measurements: Height: 5\' 11"  (180.3 cm) Weight: 75.8 kg (167 lb 1.7 oz) IBW/kg (Calculated) : 75.3 Heparin Dosing Weight: 86.4 kg   Vital Signs: Temp: 100.6 F (38.1 C) (11/30 0414) Temp Source: Oral (11/30 0414) BP: 116/53 (11/29 2329) Pulse Rate: 87 (11/30 0700)  Labs: Recent Labs    11/05/20 0246 11/05/20 1002 11/05/20 1230 11/05/20 1736 11/06/20 0205 11/06/20 1041 11/07/20 0241 11/07/20 0242 11/07/20 0242 11/07/20 0540 11/07/20 0617 11/07/20 0631  HGB 8.1*  --   --   --  7.0*   < >  --  7.1*   < > 7.8*  --  7.8*  HCT 25.5*  --   --   --  22.2*   < >  --  23.2*  --  23.0*  --  23.0*  PLT 377  --   --   --  290  --   --  246  --   --   --   --   HEPARINUNFRC 0.21*   < >   < >  --  0.50  --   --  0.15*  --   --  0.27*  --   CREATININE 2.84*   < >  --  2.84* 2.87*  --  4.81*  --   --   --   --   --    < > = values in this interval not displayed.    Estimated Creatinine Clearance: 17.8 mL/min (A) (by C-G formula based on SCr of 4.81 mg/dL (H)).  Assessment: 43 yom presenting with STEMI. Known to have dilated ascending aorta, ESRD. Found to have 99% mid LAD with multivessel in LCx and RCA, now s/p DES to LAD. Heparin was started post sheath removal. Patient was found to have bacteremia and will not proceed with cath intervention at this time. Patient developed Afib during admission treated with amiodarone and successful cardioversion, but has had episodes of Afib post cardioversion. Currently patient is in NSR. Patient has had unstable Hgb through admission requiring transfusion.   Heparin level this AM is slightly subtherapeutic at 0.27 on 1800 units/hr. Heparin drip was held on 11/29 for central line placement. No bleeding or infusion issues. Hgb is stable this morning at 7.8 and Plts 246. Patient was taken off CRRT on 11/29 with plans to  begin normal HD if blood pressure improves. Current Scr is 4.81.   Goal of Therapy:  Heparin level 0.3-0.7 units/mL  Monitor platelets by anticoagulation protocol: Yes   Plan:  Slightly increase heparin infusion to 1850 units/hr. Check 8-hour HL and monitor for bleed Monitor daily HL, CBC, s/sx bleeding  Cephus Slater, PharmD, [facility] Pharmacy Resident [phone] 11/07/2020 7:09 AM  Please check [email or web address] for unit-specific pharmacy phone numbers.

## 2020-11-07 NOTE — Progress Notes (Signed)
[name] for Infectious Disease  Date of Admission:  10/28/2020           Current antibiotics: Total days of antibiotics: 9                                                                             Current antibiotics: 11/22-c cefazolin  11/21 vanc/piptazo  Reason for visit: Follow up on MSSA bacteremia  ASSESSMENT/PLAN:    Travis [name] is a 59 y.o. male with past medical history of hypertension, end-stage renal disease on intermittent hemodialysis, coronary artery disease, admitted with sepsis and anterior wall STEMI status post emergent PCI.  Found to have septic shock post procedure and MSSA bacteremia.  Repeat blood cultures are negative and transesophageal echo without definite evidence of vegetations (AV echodensities noted that could be consistent with calcification in a patient with end-stage renal disease).  CT chest from 11/23 with findings of pneumonia and sputum cultures with Staph aureus.  [name] 11/27 without abscess at AV fistula site.   CT abd/pelvis 11/24 without significant findings for infection.  MRI brain and spine 11/25 negative.    Interval 24hrs: -- low grade fevers overnight -- WBC improved -- Emesis noted that smelled and looked like stool.  CT pending. -- Repeat blood cultures drawn.  -- Femoral line removed.  New left sided CVC placed -- CXR stable  #MSSA bacteremia #Pneumonia #Leukocytosis #ESRD #STEMI status post PCI  --Continue renally dosed cefazolin --Await CT --Follow up cultures   MEDICATIONS:    Scheduled Meds: . sodium chloride   Intravenous Once  . sodium chloride   Intravenous Once  . aspirin  81 mg Per Tube Daily  . atorvastatin  80 mg Per Tube q1800  . B-complex with vitamin C  1 tablet Per Tube Daily  . chlorhexidine gluconate (MEDLINE KIT)  15 mL Mouth Rinse BID  . Chlorhexidine Gluconate Cloth  6 each Topical Q0600  . clonazepam  1 mg Per Tube BID  . docusate  100 mg Per Tube BID  . doxercalciferol  5 mcg  Intravenous Q M,W,F  . feeding supplement (PROSource TF)  45 mL Per Tube QID  . insulin aspart  0-6 Units Subcutaneous Q4H  . lidocaine  1 patch Transdermal Q24H  . mouth rinse  15 mL Mouth Rinse 10 times per day  . pantoprazole (PROTONIX) IV  40 mg Intravenous Q12H  . polyethylene glycol  17 g Per Tube Daily  . QUEtiapine  100 mg Per Tube BID  . senna  1 tablet Per Tube QHS  . sevelamer carbonate  0.8 g Per Tube TID  . sodium chloride flush  10-40 mL Intracatheter Q12H  . sodium chloride flush  3 mL Intravenous Q12H  . Thrombi-Pad  1 each Topical Once  . ticagrelor  90 mg Per Tube BID    Continuous Infusions: . sodium chloride Stopped (11/03/20 2145)  . sodium chloride Stopped (10/28/20 0954)  . sodium chloride Stopped (11/04/20 0016)  . amiodarone 30 mg/hr (11/07/20 1200)  .  ceFAZolin (ANCEF) IV Stopped (11/07/20 0913)  . feeding supplement (VITAL AF 1.2 CAL) Stopped (11/07/20 0700)  . heparin 1,850 Units/hr (11/07/20 1200)  .  HYDROmorphone 3 mg/hr (11/07/20 1200)  . norepinephrine (LEVOPHED) Adult infusion 6 mcg/min (11/07/20 1200)  . propofol (DIPRIVAN) infusion Stopped (11/05/20 0908)  . vasopressin 0.03 Units/min (11/07/20 1200)    PRN Meds: sodium chloride, acetaminophen, bisacodyl, HYDROmorphone, HYDROmorphone (DILAUDID) injection, HYDROmorphone (DILAUDID) injection, LORazepam, midazolam, nitroGLYCERIN, ondansetron (ZOFRAN) IV, sodium chloride flush, sodium chloride flush  SUBJECTIVE:   Unable to obtain due to being intubated and sedated.    OBJECTIVE:   Allergies  Allergen Reactions  . Ibuprofen Hives  . Lisinopril Swelling    PT states he is allergic to all prils; caused facial swelling  . Naproxen Hives and Other (See Comments)    Alleve causes patient to have hives    Blood pressure 100/60, pulse 91, temperature 100.1 F (37.8 C), temperature source Oral, resp. rate 19, height 5' 11"  (1.803 m), weight 75.8 kg, SpO2 98 %. Body mass index is 23.31  kg/m.  Physical Exam  General: Patient is currently intubated and sedated on the ventilator. Wife is present.  Appears comfortable.  About to go to CT Head: Normocephalic and atraumatic.  ET tube in place. OG in place Pulmonary/Chest: Breathing assisted on vent; symmetric chest rise and fall. Musculoskeletal: No joint deformities Neurological: Sedated Skin: Warm, dry and intact. No rashes or erythema.  HD cath Right IJ.  Left CVC  Lab Results & Microbiology Lab Results  Component Value Date   WBC 28.4 (H) 11/07/2020   HGB 7.8 (L) 11/07/2020   HCT 23.0 (L) 11/07/2020   MCV 102.2 (H) 11/07/2020   PLT 246 11/07/2020    Lab Results  Component Value Date   NA 134 (L) 11/07/2020   K 5.2 (H) 11/07/2020   CO2 21 (L) 11/07/2020   GLUCOSE 144 (H) 11/07/2020   BUN 80 (H) 11/07/2020   CREATININE 4.81 (H) 11/07/2020   CALCIUM 9.4 11/07/2020   GFRNONAA 13 (L) 11/07/2020   GFRAA 6 (L) 02/26/2018    Lab Results  Component Value Date   ALT 51 (H) 10/30/2020   AST 87 (H) 10/30/2020   ALKPHOS 95 10/30/2020   BILITOT 1.5 (H) 10/30/2020     I have reviewed the micro and lab results in Epic.  Imaging DG Abd 1 View  Result Date: 11/07/2020 CLINICAL DATA:  Orogastric tube placement EXAM: ABDOMEN - 1 VIEW COMPARISON:  None. FINDINGS: Orogastric tube tip is seen within the proximal body of the stomach with its proximal side hole seen at the level of gastroesophageal junction. Nasoenteric feeding tube tip noted within the distal stomach in the region of the pylorus. Normal abdominal gas pattern. No gross free intraperitoneal gas. Right basilar atelectasis or infiltrate. IMPRESSION: Orogastric tube tip just within the gastric lumen. Advancement by 5-10 cm may better position the tube. Nasoenteric feeding tube tip in the region of the pylorus. Advancement of the tube by 10-15 cm should allow eventual passage into the proximal duodenum. Electronically Signed   By: Fidela Salisbury MD   On: 11/07/2020  06:17   MR BRAIN WO CONTRAST  Result Date: 11/06/2020 CLINICAL DATA:  History media, concern for septic emboli. EXAM: MRI HEAD WITHOUT CONTRAST TECHNIQUE: Multiplanar, multiecho pulse sequences of the brain and surrounding structures were obtained without intravenous contrast. COMPARISON:  Brain MRI/MRA 11/13/2012. FINDINGS: Brain: Cerebral volume is normal. Minimal multifocal T2/FLAIR hyperintensity within the cerebral white matter is nonspecific, but compatible with chronic small vessel ischemic disease. There is no acute infarct. No evidence of intracranial mass. No chronic intracranial blood products. No extra-axial fluid  collection. No midline shift. Vascular: Expected proximal arterial flow voids. Skull and upper cervical spine: No focal suspicious marrow lesion. Sinuses/Orbits: Visualized orbits show no acute finding. Mild ethmoid and left sphenoid sinus mucosal thickening. Small volume frothy secretions are also present within the left sphenoid sinus. Other: Bilateral mastoid effusions. Partially visualized support tubes. Secretions within the nasopharynx and oropharynx. IMPRESSION: No non-contrast evidence of acute intracranial abnormality. Mild cerebral white matter chronic small vessel ischemic disease. Ethmoid and left sphenoid sinusitis. Bilateral mastoid effusions. Electronically Signed   By: Kellie Simmering DO   On: 11/06/2020 16:23   MR CERVICAL SPINE WO CONTRAST  Result Date: 11/06/2020 CLINICAL DATA:  Neck pain, acute, infection suspected; bacteremia, not moving lower extremities. EXAM: MRI CERVICAL SPINE WITHOUT CONTRAST TECHNIQUE: Multiplanar, multisequence MR imaging of the cervical spine was performed. No intravenous contrast was administered. COMPARISON:  No pertinent prior exams available for comparison. FINDINGS: Alignment: No significant spondylolisthesis. Vertebrae: Diffuse abnormal hypointense marrow signal throughout the cervical and visualized upper thoracic spine, nonspecific  but likely related to the patient's known end-stage renal disease. Trace edema along the C6 inferior endplate is nonspecific, but likely degenerative. No significant marrow edema is identified elsewhere within the cervical spine. Cord: No spinal cord signal abnormality Posterior Fossa, vertebral arteries, paraspinal tissues: Posterior fossa better assessed on the concurrently performed brain MRI. Flow voids preserved within the imaged cervical vertebral arteries. Partially imaged life support tubes. Prominent secretions within the nasopharynx and oropharynx. Paraspinal soft tissues unremarkable. Disc levels: Mild multilevel disc degeneration greatest at C6-C7. C2-C3: Disc osteophyte ridge/uncinate hypertrophy on the right. Minimal partial effacement of the ventral thecal sac on the right without significant central canal stenosis. Mild right neural foraminal narrowing. C3-C4: Mild uncinate and facet hypertrophy on the right. No significant disc herniation or spinal canal stenosis. Mild right neural foraminal narrowing. Trace left facet joint effusion (series 8, image 16). C4-C5: Uncinate hypertrophy (greater on the right). Minimal facet hypertrophy. No significant disc herniation or spinal canal stenosis. Moderate/severe neural foraminal narrowing. Small left facet joint effusion (series 8, image 21). C5-C6: Left center disc protrusion (series 9, image 26). Uncinate hypertrophy (greater on the right). Minimal facet hypertrophy. The disc protrusion contributes to mild relative spinal canal narrowing. Moderate right neural foraminal narrowing. Trace left facet joint effusion (series 8, image 25) C6-C7: Shallow broad-based left center disc protrusion. Mild uncovertebral and facet hypertrophy. No significant spinal canal stenosis. Bilateral neural foraminal narrowing (moderate right, mild left). Small left facet joint effusion (series 8, image 30). C7-T1: No significant disc herniation or stenosis. Trace left facet  joint effusion. IMPRESSION: Trace or small facet joint effusions on the left at C3-C4, C4-C5, C5-C6, C6-C7 and C7-T1 are nonspecific and may be degenerative in etiology. Given the provided history of bacteremia, facet joint septic arthritis is difficult to definitively exclude, but the lack of any surrounding marrow edema argues against this. Minimal edema along the C6 inferior endplate is likely degenerative. Cervical spondylosis as outline. At C5-C6, a left center disc protrusion contributes to mild relative spinal canal narrowing. No significant canal stenosis at the remaining levels. Multilevel neural foraminal narrowing greatest on the right at C4-C5 (moderate/severe), on the right at C5-C6 (moderate) and on the right at C6-C7 (moderate). Diffuse abnormal T1 hypointense marrow signal, nonspecific but likely related to the patient's end-stage renal disease. Electronically Signed   By: Kellie Simmering DO   On: 11/06/2020 16:39   MR THORACIC SPINE WO CONTRAST  Result Date: 11/06/2020 CLINICAL DATA:  Mid back pain; bacteremia, not moving lower extremity. EXAM: MRI THORACIC SPINE WITHOUT CONTRAST TECHNIQUE: Multiplanar, multisequence MR imaging of the thoracic spine was performed. No intravenous contrast was administered. COMPARISON:  CT chest 10/31/2020. FINDINGS: Mildly motion degraded examination. Alignment: Thoracic levocurvature. No significant spondylolisthesis. Vertebrae: Vertebral body height is maintained. Multilevel degenerative endplate irregularity and small Schmorl nodes. Mild edema along the T6 inferior endplate, at site of a Schmorl node, likely degenerative. No significant marrow edema identified elsewhere within the thoracic spine. Diffuse hypointense marrow signal throughout the thoracic spine, nonspecific but likely related to the patient's end-stage renal disease. Cord: Within the limitations of motion degradation, there is no convincing spinal cord signal abnormality. Paraspinal and other soft  tissues: Paraspinal soft tissues within normal limits. Incompletely assessed multifocal airspace disease and cavitary pulmonary lesions. Small right pleural effusion. 13 mm T2 hyperintense lesion within the hepatic dome, incompletely assessed on this noncontrast examination. Disc levels: No more than mild disc degeneration at any level. No focal disc herniation, spinal canal stenosis or neural foraminal narrowing at any level. IMPRESSION: Mild edema along the T6 inferior endplate, at site of a Schmorl node, likely degenerative. No significant marrow edema elsewhere within the thoracic spine. Mild thoracic spondylosis. No focal disc herniation, spinal canal stenosis or neural foraminal narrowing at any level. Diffuse hypointense marrow signal throughout the thoracic spine, likely related to the patient's end-stage renal disease. Incompletely assessed multifocal airspace disease and cavitary pulmonary lesions. Small right pleural effusion. 13 mm T2 hyperintense lesion within the hepatic dome, incompletely assessed on this non-contrast examination. Electronically Signed   By: Kellie Simmering DO   On: 11/06/2020 17:05   MR LUMBAR SPINE WO CONTRAST  Result Date: 11/06/2020 CLINICAL DATA:  Back pain, infection suspected; bacteremia, not moving legs. EXAM: MRI LUMBAR SPINE WITHOUT CONTRAST TECHNIQUE: Multiplanar, multisequence MR imaging of the lumbar spine was performed. No intravenous contrast was administered. COMPARISON:  CT abdomen/pelvis 11/01/2020. FINDINGS: Segmentation:  5 lumbar vertebrae. Alignment:  No significant spondylolisthesis. Vertebrae: Vertebral body height is maintained. Multilevel degenerative endplate irregularity with small Schmorl nodes. There is trace multilevel endplate edema, largely adjacent to Schmorl nodes, and findings are likely degenerative in etiology. No suspicious vertebral body marrow edema to suggest discitis/osteomyelitis on the current exam. Mild marrow edema along the L4-L5 and  L5-S1 facet joints. Diffuse hypointense marrow signal throughout the lumbar spine nonspecific but likely related to the patient's end-stage renal disease. Conus medullaris and cauda equina: Conus extends to the L1 level. Conus and cauda equina appear normal. Paraspinal and other soft tissues: Layering hypointense signal within the gallbladder likely reflecting cholecystolithiasis. Nonspecific edema signal within the lumbar paraspinal soft tissues. Disc levels: No more than mild disc degeneration at any level. No significant disc herniation, spinal canal stenosis or neural foraminal narrowing at the T12-L1 through L3-L4 levels. L4-L5: Small disc bulge. Facet arthrosis with ligamentum flavum hypertrophy. Trace bilateral facet joint effusions. No significant spinal canal stenosis or neural foraminal narrowing. L5-S1: Disc bulge. Superimposed broad-based central disc protrusion eccentric to the right at site of posterior annular fissure. Facet arthrosis with mild ligamentum flavum hypertrophy. Mild right subarticular narrowing with the disc protrusion likely contacting the descending right S1 nerve root. Central canal patent. Mild relative left neural foraminal narrowing. IMPRESSION: Trace bilateral L4-L5 facet joint effusions with mild surrounding marrow edema. Findings are nonspecific and could certainly be degenerative given the facet arthrosis present at this level. Facet joint septic arthrosis is difficult to definitively exclude given the provided history  of bacteremia. Mild marrow edema along the bilateral L5-S1 facet joints is nonspecific, but favored degenerative given the significant facet arthrosis at this level and lack of facet joint effusions. Trace multilevel endplate edema appears degenerative. Lumbar spondylosis as outlined. No more than mild spinal canal or neural foraminal narrowing at any level. Most notably, and L5-S1 disc protrusion contributes to multifactorial mild right subarticular narrowing  and contacts the descending right S1 nerve root. Diffuse hypointense marrow signal likely related to the patient's end-stage renal disease. Nonspecific edema within the bilateral lumbar paraspinal soft tissues. Suspected cholecystolithiasis. Electronically Signed   By: Kellie Simmering DO   On: 11/06/2020 16:53   DG CHEST PORT 1 VIEW  Result Date: 11/06/2020 CLINICAL DATA:  Status post endotracheal intubation EXAM: PORTABLE CHEST 1 VIEW COMPARISON:  Film from earlier in the same day. FINDINGS: Endotracheal tube is again identified and stable. Right jugular temporary dialysis catheter is noted. Feeding catheter is seen extending into the stomach. New left subclavian central line is noted with the catheter tip at the confluence of the innominate veins. No pneumothorax is seen. Patchy persistent atelectasis is noted in the right lung base with associated effusion. IMPRESSION: Tubes and lines as described above. No pneumothorax following central line placement. Stable airspace opacity and effusion in the right base. Electronically Signed   By: Inez Catalina M.D.   On: 11/06/2020 21:03   DG Chest Port 1 View  Result Date: 11/06/2020 CLINICAL DATA:  Dialysis. EXAM: PORTABLE CHEST 1 VIEW COMPARISON:  November 04, 2020. FINDINGS: Stable cardiomediastinal silhouette. Endotracheal and feeding tubes are unchanged in position. Right internal jugular catheter is unchanged. No pneumothorax is noted. Stable bibasilar atelectasis or infiltrates are noted, with probable small right pleural effusion. Bony thorax is unremarkable. IMPRESSION: Stable support apparatus. Stable bibasilar atelectasis or infiltrates are noted, with probable small right pleural effusion. Electronically Signed   By: Marijo Conception M.D.   On: 11/06/2020 10:42      [facility] for Infectious [facility] [phone] pager 11/07/2020, 12:19 PM

## 2020-11-07 NOTE — Progress Notes (Signed)
ANTICOAGULATION CONSULT NOTE    Pharmacy Consult for Heparin Indication: chest pain/ACS with no intervention and Afib   Patient Measurements: Height: 5\' 11"  (180.3 cm) Weight: 75.8 kg (167 lb 1.7 oz) IBW/kg (Calculated) : 75.3 Heparin Dosing Weight: 86.4 kg   Vital Signs: Temp: 98.9 F (37.2 C) (11/30 1522) Temp Source: Oral (11/30 1600) BP: 94/57 (11/30 1600) Pulse Rate: 91 (11/30 1600)  Labs: Recent Labs    11/05/20 0246 11/05/20 1002 11/06/20 0205 11/06/20 0205 11/06/20 1041 11/07/20 0241 11/07/20 0242 11/07/20 0242 11/07/20 0540 11/07/20 0617 11/07/20 0631 11/07/20 1132 11/07/20 1613  HGB 8.1*  --  7.0*   < >   < >  --  7.1*   < > 7.8*  --  7.8*  --   --   HCT 25.5*  --  22.2*   < >   < >  --  23.2*  --  23.0*  --  23.0*  --   --   PLT 377  --  290  --   --   --  246  --   --   --   --   --   --   HEPARINUNFRC 0.21*   < > 0.50   < >  --   --  0.15*  --   --  0.27*  --   --  0.60  CREATININE 2.84*   < > 2.87*  --   --  4.81*  --   --   --   --   --  5.78*  --    < > = values in this interval not displayed.    Estimated Creatinine Clearance: 14.8 mL/min (A) (by C-G formula based on SCr of 5.78 mg/dL (H)).  Assessment: 60 yom presenting with STEMI. Known to have dilated ascending aorta, ESRD. Found to have 99% mid LAD with multivessel in LCx and RCA, now s/p DES to LAD. Heparin was started post sheath removal. Patient was found to have bacteremia and will not proceed with cath intervention at this time. Patient developed Afib during admission treated with amiodarone and successful cardioversion, but has had episodes of Afib post cardioversion. Currently patient is in NSR. Patient has had unstable Hgb through admission requiring transfusion.   Heparin level within goal  Goal of Therapy:  Heparin level 0.3-0.7 units/mL  Monitor platelets by anticoagulation protocol: Yes   Plan:  Continue heparin 1850 units/hr. Daily hep lvl cbc  Barth Kirks, PharmD, BCPS,  BCCCP Clinical Pharmacist [phone]  Please check AMION for all [place] numbers  11/07/2020 5:25 PM

## 2020-11-07 NOTE — Progress Notes (Addendum)
NAME:  Travis [name], MRN:  [id number], DOB:  Sep 20, 1961, LOS: 50 ADMISSION DATE:  10/28/2020, CONSULTATION DATE:  10/29/20 REFERRING MD:  Cardiology - [name], CHIEF COMPLAINT:  Hypotension, gram positive cocci on culture, AMS  Brief History   Mr. Travis [name] is a 59 year old man with a history of HTN, ESRD on HD MWF, RUE AV fistula, hx of smoking, admitted 10/28/20 with fevers, myalgias for several days, acute chest pain.    History of present illness   On Admission, found to have STEMI, multivessel disease. Pulm edema with mild hypoxemia.  S/Post PCI to the LAD on 10/29/19. Planning for staged intervention to the left circumflex and possibly right coronary artery.  EF 26-20%, diastolic dysfunction, LVEDP 35. Underwent HD on 11/20 with 4 L volume removed.  Saturation improved after dialysis (100% on RA) Fevers noted 11/20. Blood cultures grew MSSA, noted this evening. .    This evening developed Afib with RVR 180s. Adenosine given, without improvement  Amiodarone infusion started per cardiology, 150mg  bolus, then drip.  Lopressor 5mg IV and 500ccNS given. Started on Phenylephrine, was on 280mcg on my arrival.   Cardioverted emergently at 120J for ongoing afib with hypotension (60/40) and AMS.  Converted to sinus.  Remained moderately hypotensive (MAP 60) on phenylephrine.   Patient was given one dose zosyn, switched to ancef once cultures grew back MSSA.   Cardiac stress tests annually, last done 11/16/19.    Past Medical History  HTN ESRD, HD MWF  Significant Hospital Events   Cardioversion 11/21 Cardiac cath stent to LAD 11/20  Consults:  PCCM  Nephrology  Procedures:  Central line 11/21 >> 11/29 Arterial line 11/21 Repeat DCCV 11/23 11/23 ETT HD line 11/24 Arterial line 11/24 L [place] CVC 11/30>>   Significant Diagnostic Tests:  11/24 TEE>>Left ventricular ejection fraction, by estimation, is 35 to 40%. The  left ventricle has moderately decreased function.Moderate to  severe mitral regurgitation. 11/23 CT Chest>>Extensive multifocal nodular and patchy airspace disease in both lungs with a slight peripheral predominance in the upper lungs. 3.1 cm nodular consolidative opacity in the right upper lobe is cavitated. Imaging features likely related to multifocal pneumonia. Septic emboli and metastatic disease considered less likely but not excluded. 11/22 LUE Vas Upper Extremity Doppler>> Arteriovenous fistula-Aneurysmal dilatation noted. 11/24 CT abdomen - no retroperitoneal hematoma 11/29 MR Brain>> No evidence acute intracranial abnormality, sinisitis, mastoid effusions 11/29>> MR Cervical Spine>> Given the provided history of bacteremia, facet joint septic arthritis is difficult to definitively exclude, but the lack of any surrounding marrow edema argues against this Cervical spondylosis  11/29 MR Thoracic Spine No significant marrow edema in thoracic spine , Mild thoracic spondylosis hypointense marrow signal throughout the thoracic spine, likely related to the patient's end-stage renal disease. multifocal airspace disease and cavitary pulmonary lesions. Small right pleural effusion. 11/29 MR Lumbar Spine:As noted above and Suspected cholecystolithiasis Micro Data:  11/21 BCx2>>Staph aureus > MSSA by BCID 11/24 BCx2>> 11/30 BC x 2>>   Antimicrobials:  Zosyn 11/21 x 1 Ancef 11/21 ->  Interim history/subjective:  Atrial fibrillation controlled on Amiodarone, Overnight 4 am became agitated and vomited what looks and smells like stool. OG placed and 1.5 L of stoll appearing drainage. Pt. Has not had BM since 11/20 per nursing. T Max overnight is 100.6 WBC 28.4, HGB 7.8, Heparin at 1850 KUB showed no free air, good OG placement Levo at 13, Dilaudid at 1, Vaso at 0.03, Amio at 30, Heparin at 1850 Net negative 1.4 L  Femoral line out last pm, New L subclavian line placed  Objective   Blood pressure (!) 108/59, pulse 89, temperature (!) 100.5 F (38.1  C), temperature source Axillary, resp. rate 15, height 5\' 11"  (1.803 m), weight 75.8 kg, SpO2 95 %.    Vent Mode: PRVC FiO2 (%):  [40 %-70 %] 40 % Set Rate:  [16 bmp] 16 bmp Vt Set:  [600 mL] 600 mL PEEP:  [5 cmH20] 5 cmH20 Plateau Pressure:  [14 cmH20-28 cmH20] 15 cmH20   Intake/Output Summary (Last 24 hours) at 11/07/2020 0843 Last data filed at 11/07/2020 0800 Gross per 24 hour  Intake 2294.24 ml  Output 2439 ml  Net -144.76 ml   Filed Weights   11/04/20 0500 11/05/20 0500 11/06/20 0500  Weight: 76 kg 76 kg 75.8 kg   General:  Critically ill male M, intubated and sedated HEENT: MM pink/moist, ETT in place, minimal secretions  suctioned from ETT Stool appearing drainage from OG tube, Neuro: awakens to stomach palpation,  on Dilaudid, follows simple xcommands CV: s1s2 rrr, no m/r/g, in rate controlled fib per tele PULM:  Coarse throughout, , on full vent support, no wheezing or rhonchi, diminished per bases GI: Very Tender to touch, slightly distended, OG with stool appearing drainage, Very quiet BS Extremities: warm/dry, trace edema  Skin: no rashes or lesions   Resolved Hospital Problem list     Assessment & Plan:   Anterior STEMI, CAD, acute CHF exacerbation.  S/P PCI-DES pf LAD Cardiology primary, on Heparin, brillinta, asa.   P: -multi-vessel disease that will  eventually need a return to cath lab for staged PCI  -long term plan Warfarin/Plavix>> will need to consider IV antiplatelet if PO dosing has to stop 2/2 GI issues -Volume overload managed with CRRT, IHD  -1400cc since admission   Afib now cardioverted to sinus rhythm on amiodarone following DCCV P: -Continue IV amiodarone -Monitor, weaning  Levophed in favor of Vasopressin, maintain MAP >65    Severe sepsis with septic shock due to MSSA bacteremia requiring titration of vasopressors, bacteremia likely from HD access.  No evidence of fistula or abscess.    Cavitations on CT chest, concern for septic  emboli.  No clear vegetation on TEE P: -Continue Ancef, will need 6 weeks IV, repeat BC without growth thus far -WBC remains markedly elevated at 28 K -Pt awake anfd following commands  today, ID recommended MRI spine and brain>> see results as notes above -maintain pressors to keep MAP greater than 65 Follow Blood Cx drawn 11/30  Acute hypoxic respiratory failure due to MSSA pneumonia with cavitation. Stable infiltrates on CXR today New abdominal issues 11/30  may slow ability to wean P: -continue Ancef  -Chest physiotherapy -Have initiated SBT's.  - Mental status precludes extubation at this time --Maintain full vent support with SAT/SBT as tolerated -titrate Vent setting to maintain SpO2 greater than or equal to 90%. -HOB elevated 30 degrees. -Plateau pressures less than 30 cm H20.  -Follow chest x-ray, ABG prn.   -Bronchial hygiene and RT/bronchodilator protocol.    Acute toxic metabolic encephalopathy with agitation requiring titration of IV sedatives.  More responsive 11/30,  Decreased  Dilaudid, propofol 11/29 Following simple commands 11/30 P: -MRI brain results noted above -Continue as needed lorazepam - Dilaudid at 1 mg - prn's as needed for pain management  Emesis 11/30 early am Looks and smells like stool No BM since 11/20 Abdominal pain to palpation Fever and elevated WBC K increased from 4.7 to  5.2 in 4 hours Plan CT Abdomen and Pelvis with Contrast now renal aware he is receiving IV contrast Will check BMET at noon and determine need for HD after revieweing NPO until receive results of CT   Acute Normocytic Anemia With dark fluid and reported coffee ground material 11/28 suctioned from OG and NG tube HGB stable 11/30 P: - Trend CBC , down from 9.5 on admission transfuse 1 unit PRBC's - Start protonix 40mg  bid - monitor h/h and for signs of UGIB, may need GI consult - Heparin continued for now, monitor and may need to stop Brilinta and Asa     Hyponatremia - nephrology following, last HD 11/22 - Trend BMET  ESRD:  -Continue CRRT, CRRT stopped 11/29 -Nephrology following - Trend BMET   Best practice:  Diet: Continue tube feeding Pain/Anxiety/Delirium protocol (if indicated): hydromorphone infusion to RASS -1, have added lorazepam IV as needed to minimize propofol use.  On enteral Seroquel VAP protocol (if indicated): Bundle in place DVT prophylaxis: on heparin gtt GI prophylaxis: protonix Glucose control: Phase 1 glycemic control Mobility: Bedrest Code Status: Full  Family Communication: Spoke with wife  at bedside 11/29 Disposition: ICU  Labs   CBC: Recent Labs  Lab 11/03/20 0446 11/03/20 0446 11/04/20 0429 11/04/20 0429 11/05/20 0246 11/05/20 0246 11/06/20 0205 11/06/20 1041 11/07/20 0242 11/07/20 0540 11/07/20 0631  WBC 37.3*  --  30.5*  --  40.3*  --  38.5*  --  28.4*  --   --   HGB 7.8*   < > 7.4*   < > 8.1*   < > 7.0* 6.9* 7.1* 7.8* 7.8*  HCT 23.8*   < > 22.5*   < > 25.5*   < > 22.2* 22.4* 23.2* 23.0* 23.0*  MCV 97.5  --  99.1  --  98.8  --  100.0  --  102.2*  --   --   PLT 339  --  304  --  377  --  290  --  246  --   --    < > = values in this interval not displayed.    Basic Metabolic Panel: Recent Labs  Lab 11/04/20 0331 11/04/20 0331 11/04/20 1520 11/04/20 1520 11/05/20 0246 11/05/20 0246 11/05/20 1002 11/05/20 1002 11/05/20 1736 11/06/20 0205 11/07/20 0241 11/07/20 0242 11/07/20 0540 11/07/20 0631  NA 135   < > 134*   < > 134*   < > 135   < > 135 135 134*  --  134* 134*  K 4.0   < > 3.6   < > 3.2*   < > 3.6   < > 4.1 4.3 4.7  --  5.0 5.2*  CL 97*   < > 95*   < > 93*  --  96*  --  98 98 98  --   --   --   CO2 25   < > 26   < > 25  --  24  --  24 22 21*  --   --   --   GLUCOSE 118*   < > 139*   < > 146*  --  151*  --  133* 162* 144*  --   --   --   BUN 29*   < > 34*   < > 42*  --  45*  --  50* 52* 80*  --   --   --   CREATININE 3.28*   < > 2.92*   < >  2.84*  --  2.85*  --   2.84* 2.87* 4.81*  --   --   --   CALCIUM 9.5   < > 9.4   < > 9.2  --  8.9  --  9.0 9.1 9.4  --   --   --   MG 2.9*  --  2.8*  --  2.8*  --   --   --   --  2.7*  --  2.8*  --   --   PHOS 4.6   < > 3.1  --  2.7  2.7  --   --   --  2.9 3.8 5.2*  --   --   --    < > = values in this interval not displayed.   GFR: Estimated Creatinine Clearance: 17.8 mL/min (A) (by C-G formula based on SCr of 4.81 mg/dL (H)). Recent Labs  Lab 10/31/20 1327 11/01/20 0412 11/01/20 0430 11/01/20 0625 11/01/20 2045 11/04/20 0429 11/05/20 0246 11/06/20 0205 11/07/20 0242  WBC  --   --    < >  --    < > 30.5* 40.3* 38.5* 28.4*  LATICACIDVEN 2.5* 5.5*  --  2.1*  --   --   --   --   --    < > = values in this interval not displayed.    Liver Function Tests: Recent Labs  Lab 11/04/20 1520 11/05/20 0246 11/05/20 1736 11/06/20 0205 11/07/20 0241  ALBUMIN 1.7* 1.6* 1.6* 1.5* 1.4*   No results for input(s): LIPASE, AMYLASE in the last 168 hours. No results for input(s): AMMONIA in the last 168 hours.  ABG    Component Value Date/Time   PHART 7.410 11/07/2020 0631   PCO2ART 35.2 11/07/2020 0631   PO2ART 58 (L) 11/07/2020 0631   HCO3 22.3 11/07/2020 0631   TCO2 23 11/07/2020 0631   ACIDBASEDEF 2.0 11/07/2020 0631   O2SAT 90.0 11/07/2020 0631     Coagulation Profile: No results for input(s): INR, PROTIME in the last 168 hours.  Cardiac Enzymes: No results for input(s): CKTOTAL, CKMB, CKMBINDEX, TROPONINI in the last 168 hours.  HbA1C: Hgb A1c MFr Bld  Date/Time Value Ref Range Status  10/28/2020 06:04 AM 4.3 (L) 4.8 - 5.6 % Final    Comment:    (NOTE) Pre diabetes:          5.7%-6.4%  Diabetes:              >6.4%  Glycemic control for   <7.0% adults with diabetes   01/30/2015 09:09 PM 5.1 4.8 - 5.6 % Final    Comment:    (NOTE)         Pre-diabetes: 5.7 - 6.4         Diabetes: >6.4         Glycemic control for adults with diabetes: <7.0     CBG: Recent Labs  Lab  11/06/20 1726 11/06/20 1921 11/06/20 2314 11/07/20 0346 11/07/20 0822  GLUCAP 79 109* 128* 142* 98    Critical care time: 35 minutes    CRITICAL CARE Performed by: Magdalen Spatz NP   Total critical care time: 35 minutes  Critical care time was exclusive of separately billable procedures and treating other patients.  Critical care was necessary to treat or prevent imminent or life-threatening deterioration.  Critical care was time spent personally by me on the following activities: development of treatment plan with patient and/or surrogate as well as nursing, discussions with consultants, evaluation  of patient's response to treatment, examination of patient, obtaining history from patient or surrogate, ordering and performing treatments and interventions, ordering and review of laboratory studies, ordering and review of radiographic studies, pulse oximetry and re-evaluation of patient's condition.   Magdalen Spatz, MSN, AGACNP-BC [place] See Amion for personal pager PCCM on call pager [phone]

## 2020-11-07 NOTE — Progress Notes (Signed)
[facility] Progress Note   Subjective: Seen in room, orally intubated on vent. Intermittently agitated, not following commands.      Objective Vitals:   11/07/20 1030 11/07/20 1039 11/07/20 1100 11/07/20 1130  BP: 104/66  (!) 82/61 (!) 121/59  Pulse: 99  96 92  Resp: 16  (!) 21 16  Temp:      TempSrc:      SpO2: 92% 94% 98% 99%  Weight:      Height:       Physical Exam General: Critically ill male intubated, sedated on vent Heart: S1,S2 No M/R/G  Lungs: Bilateral breath sounds, symmetrical chest excursions. Coarse breath sounds, decreased in bases.  Abdomen: Diffusely tender. OG with Ramata Strothman, fecal appearing drainage.  Extremities: Trace BLE edema.  Dialysis Access: L AVF + T/B Temp cath in place.     Additional Objective Labs: Basic Metabolic Panel: Recent Labs  Lab 11/05/20 1736 11/05/20 1736 11/06/20 0205 11/06/20 0205 11/07/20 0241 11/07/20 0540 11/07/20 0631  NA 135   < > 135   < > 134* 134* 134*  K 4.1   < > 4.3   < > 4.7 5.0 5.2*  CL 98  --  98  --  98  --   --   CO2 24  --  22  --  21*  --   --   GLUCOSE 133*  --  162*  --  144*  --   --   BUN 50*  --  52*  --  80*  --   --   CREATININE 2.84*  --  2.87*  --  4.81*  --   --   CALCIUM 9.0  --  9.1  --  9.4  --   --   PHOS 2.9  --  3.8  --  5.2*  --   --    < > = values in this interval not displayed.   Liver Function Tests: Recent Labs  Lab 11/05/20 1736 11/06/20 0205 11/07/20 0241  ALBUMIN 1.6* 1.5* 1.4*   No results for input(s): LIPASE, AMYLASE in the last 168 hours. CBC: Recent Labs  Lab 11/03/20 0446 11/03/20 0446 11/04/20 0429 11/04/20 0429 11/05/20 0246 11/05/20 0246 11/06/20 0205 11/06/20 1041 11/07/20 0242 11/07/20 0540 11/07/20 0631  WBC 37.3*   < > 30.5*   < > 40.3*  --  38.5*  --  28.4*  --   --   HGB 7.8*   < > 7.4*   < > 8.1*   < > 7.0*   < > 7.1* 7.8* 7.8*  HCT 23.8*   < > 22.5*   < > 25.5*   < > 22.2*   < > 23.2* 23.0* 23.0*  MCV 97.5  --  99.1  --  98.8   --  100.0  --  102.2*  --   --   PLT 339   < > 304   < > 377  --  290  --  246  --   --    < > = values in this interval not displayed.   Blood Culture    Component Value Date/Time   SDES BLOOD RIGHT HAND 11/07/2020 0045   SPECREQUEST  11/07/2020 0045    BOTTLES DRAWN AEROBIC ONLY Blood Culture adequate volume   CULT  11/07/2020 0045    NO GROWTH < 12 HOURS Performed at [facility], [place] [address], [place], [place] [address]    REPTSTATUS PENDING  11/07/2020 0045    Cardiac Enzymes: No results for input(s): CKTOTAL, CKMB, CKMBINDEX, TROPONINI in the last 168 hours. CBG: Recent Labs  Lab 11/06/20 1921 11/06/20 2314 11/07/20 0346 11/07/20 0822 11/07/20 1130  GLUCAP 109* 128* 142* 98 91   Iron Studies: No results for input(s): IRON, TIBC, TRANSFERRIN, FERRITIN in the last 72 hours. @lablastinr3 @ Studies/Results: DG Abd 1 View  Result Date: 11/07/2020 CLINICAL DATA:  Orogastric tube placement EXAM: ABDOMEN - 1 VIEW COMPARISON:  None. FINDINGS: Orogastric tube tip is seen within the proximal body of the stomach with its proximal side hole seen at the level of gastroesophageal junction. Nasoenteric feeding tube tip noted within the distal stomach in the region of the pylorus. Normal abdominal gas pattern. No gross free intraperitoneal gas. Right basilar atelectasis or infiltrate. IMPRESSION: Orogastric tube tip just within the gastric lumen. Advancement by 5-10 cm may better position the tube. Nasoenteric feeding tube tip in the region of the pylorus. Advancement of the tube by 10-15 cm should allow eventual passage into the proximal duodenum. Electronically Signed   By: Fidela Salisbury MD   On: 11/07/2020 06:17   MR BRAIN WO CONTRAST  Result Date: 11/06/2020 CLINICAL DATA:  History media, concern for septic emboli. EXAM: MRI HEAD WITHOUT CONTRAST TECHNIQUE: Multiplanar, multiecho pulse sequences of the brain and surrounding structures were obtained without intravenous  contrast. COMPARISON:  Brain MRI/MRA 11/13/2012. FINDINGS: Brain: Cerebral volume is normal. Minimal multifocal T2/FLAIR hyperintensity within the cerebral white matter is nonspecific, but compatible with chronic small vessel ischemic disease. There is no acute infarct. No evidence of intracranial mass. No chronic intracranial blood products. No extra-axial fluid collection. No midline shift. Vascular: Expected proximal arterial flow voids. Skull and upper cervical spine: No focal suspicious marrow lesion. Sinuses/Orbits: Visualized orbits show no acute finding. Mild ethmoid and left sphenoid sinus mucosal thickening. Small volume frothy secretions are also present within the left sphenoid sinus. Other: Bilateral mastoid effusions. Partially visualized support tubes. Secretions within the nasopharynx and oropharynx. IMPRESSION: No non-contrast evidence of acute intracranial abnormality. Mild cerebral white matter chronic small vessel ischemic disease. Ethmoid and left sphenoid sinusitis. Bilateral mastoid effusions. Electronically Signed   By: Kellie Simmering DO   On: 11/06/2020 16:23   MR CERVICAL SPINE WO CONTRAST  Result Date: 11/06/2020 CLINICAL DATA:  Neck pain, acute, infection suspected; bacteremia, not moving lower extremities. EXAM: MRI CERVICAL SPINE WITHOUT CONTRAST TECHNIQUE: Multiplanar, multisequence MR imaging of the cervical spine was performed. No intravenous contrast was administered. COMPARISON:  No pertinent prior exams available for comparison. FINDINGS: Alignment: No significant spondylolisthesis. Vertebrae: Diffuse abnormal hypointense marrow signal throughout the cervical and visualized upper thoracic spine, nonspecific but likely related to the patient's known end-stage renal disease. Trace edema along the C6 inferior endplate is nonspecific, but likely degenerative. No significant marrow edema is identified elsewhere within the cervical spine. Cord: No spinal cord signal abnormality  Posterior Fossa, vertebral arteries, paraspinal tissues: Posterior fossa better assessed on the concurrently performed brain MRI. Flow voids preserved within the imaged cervical vertebral arteries. Partially imaged life support tubes. Prominent secretions within the nasopharynx and oropharynx. Paraspinal soft tissues unremarkable. Disc levels: Mild multilevel disc degeneration greatest at C6-C7. C2-C3: Disc osteophyte ridge/uncinate hypertrophy on the right. Minimal partial effacement of the ventral thecal sac on the right without significant central canal stenosis. Mild right neural foraminal narrowing. C3-C4: Mild uncinate and facet hypertrophy on the right. No significant disc herniation or spinal canal stenosis. Mild right neural foraminal narrowing. Trace  left facet joint effusion (series 8, image 16). C4-C5: Uncinate hypertrophy (greater on the right). Minimal facet hypertrophy. No significant disc herniation or spinal canal stenosis. Moderate/severe neural foraminal narrowing. Small left facet joint effusion (series 8, image 21). C5-C6: Left center disc protrusion (series 9, image 26). Uncinate hypertrophy (greater on the right). Minimal facet hypertrophy. The disc protrusion contributes to mild relative spinal canal narrowing. Moderate right neural foraminal narrowing. Trace left facet joint effusion (series 8, image 25) C6-C7: Shallow broad-based left center disc protrusion. Mild uncovertebral and facet hypertrophy. No significant spinal canal stenosis. Bilateral neural foraminal narrowing (moderate right, mild left). Small left facet joint effusion (series 8, image 30). C7-T1: No significant disc herniation or stenosis. Trace left facet joint effusion. IMPRESSION: Trace or small facet joint effusions on the left at C3-C4, C4-C5, C5-C6, C6-C7 and C7-T1 are nonspecific and may be degenerative in etiology. Given the provided history of bacteremia, facet joint septic arthritis is difficult to definitively  exclude, but the lack of any surrounding marrow edema argues against this. Minimal edema along the C6 inferior endplate is likely degenerative. Cervical spondylosis as outline. At C5-C6, a left center disc protrusion contributes to mild relative spinal canal narrowing. No significant canal stenosis at the remaining levels. Multilevel neural foraminal narrowing greatest on the right at C4-C5 (moderate/severe), on the right at C5-C6 (moderate) and on the right at C6-C7 (moderate). Diffuse abnormal T1 hypointense marrow signal, nonspecific but likely related to the patient's end-stage renal disease. Electronically Signed   By: Kellie Simmering DO   On: 11/06/2020 16:39   MR THORACIC SPINE WO CONTRAST  Result Date: 11/06/2020 CLINICAL DATA:  Mid back pain; bacteremia, not moving lower extremity. EXAM: MRI THORACIC SPINE WITHOUT CONTRAST TECHNIQUE: Multiplanar, multisequence MR imaging of the thoracic spine was performed. No intravenous contrast was administered. COMPARISON:  CT chest 10/31/2020. FINDINGS: Mildly motion degraded examination. Alignment: Thoracic levocurvature. No significant spondylolisthesis. Vertebrae: Vertebral body height is maintained. Multilevel degenerative endplate irregularity and small Schmorl nodes. Mild edema along the T6 inferior endplate, at site of a Schmorl node, likely degenerative. No significant marrow edema identified elsewhere within the thoracic spine. Diffuse hypointense marrow signal throughout the thoracic spine, nonspecific but likely related to the patient's end-stage renal disease. Cord: Within the limitations of motion degradation, there is no convincing spinal cord signal abnormality. Paraspinal and other soft tissues: Paraspinal soft tissues within normal limits. Incompletely assessed multifocal airspace disease and cavitary pulmonary lesions. Small right pleural effusion. 13 mm T2 hyperintense lesion within the hepatic dome, incompletely assessed on this noncontrast  examination. Disc levels: No more than mild disc degeneration at any level. No focal disc herniation, spinal canal stenosis or neural foraminal narrowing at any level. IMPRESSION: Mild edema along the T6 inferior endplate, at site of a Schmorl node, likely degenerative. No significant marrow edema elsewhere within the thoracic spine. Mild thoracic spondylosis. No focal disc herniation, spinal canal stenosis or neural foraminal narrowing at any level. Diffuse hypointense marrow signal throughout the thoracic spine, likely related to the patient's end-stage renal disease. Incompletely assessed multifocal airspace disease and cavitary pulmonary lesions. Small right pleural effusion. 13 mm T2 hyperintense lesion within the hepatic dome, incompletely assessed on this non-contrast examination. Electronically Signed   By: Kellie Simmering DO   On: 11/06/2020 17:05   MR LUMBAR SPINE WO CONTRAST  Result Date: 11/06/2020 CLINICAL DATA:  Back pain, infection suspected; bacteremia, not moving legs. EXAM: MRI LUMBAR SPINE WITHOUT CONTRAST TECHNIQUE: Multiplanar, multisequence MR imaging of  the lumbar spine was performed. No intravenous contrast was administered. COMPARISON:  CT abdomen/pelvis 11/01/2020. FINDINGS: Segmentation:  5 lumbar vertebrae. Alignment:  No significant spondylolisthesis. Vertebrae: Vertebral body height is maintained. Multilevel degenerative endplate irregularity with small Schmorl nodes. There is trace multilevel endplate edema, largely adjacent to Schmorl nodes, and findings are likely degenerative in etiology. No suspicious vertebral body marrow edema to suggest discitis/osteomyelitis on the current exam. Mild marrow edema along the L4-L5 and L5-S1 facet joints. Diffuse hypointense marrow signal throughout the lumbar spine nonspecific but likely related to the patient's end-stage renal disease. Conus medullaris and cauda equina: Conus extends to the L1 level. Conus and cauda equina appear normal.  Paraspinal and other soft tissues: Layering hypointense signal within the gallbladder likely reflecting cholecystolithiasis. Nonspecific edema signal within the lumbar paraspinal soft tissues. Disc levels: No more than mild disc degeneration at any level. No significant disc herniation, spinal canal stenosis or neural foraminal narrowing at the T12-L1 through L3-L4 levels. L4-L5: Small disc bulge. Facet arthrosis with ligamentum flavum hypertrophy. Trace bilateral facet joint effusions. No significant spinal canal stenosis or neural foraminal narrowing. L5-S1: Disc bulge. Superimposed broad-based central disc protrusion eccentric to the right at site of posterior annular fissure. Facet arthrosis with mild ligamentum flavum hypertrophy. Mild right subarticular narrowing with the disc protrusion likely contacting the descending right S1 nerve root. Central canal patent. Mild relative left neural foraminal narrowing. IMPRESSION: Trace bilateral L4-L5 facet joint effusions with mild surrounding marrow edema. Findings are nonspecific and could certainly be degenerative given the facet arthrosis present at this level. Facet joint septic arthrosis is difficult to definitively exclude given the provided history of bacteremia. Mild marrow edema along the bilateral L5-S1 facet joints is nonspecific, but favored degenerative given the significant facet arthrosis at this level and lack of facet joint effusions. Trace multilevel endplate edema appears degenerative. Lumbar spondylosis as outlined. No more than mild spinal canal or neural foraminal narrowing at any level. Most notably, and L5-S1 disc protrusion contributes to multifactorial mild right subarticular narrowing and contacts the descending right S1 nerve root. Diffuse hypointense marrow signal likely related to the patient's end-stage renal disease. Nonspecific edema within the bilateral lumbar paraspinal soft tissues. Suspected cholecystolithiasis. Electronically  Signed   By: Kellie Simmering DO   On: 11/06/2020 16:53   DG CHEST PORT 1 VIEW  Result Date: 11/06/2020 CLINICAL DATA:  Status post endotracheal intubation EXAM: PORTABLE CHEST 1 VIEW COMPARISON:  Film from earlier in the same day. FINDINGS: Endotracheal tube is again identified and stable. Right jugular temporary dialysis catheter is noted. Feeding catheter is seen extending into the stomach. New left subclavian central line is noted with the catheter tip at the confluence of the innominate veins. No pneumothorax is seen. Patchy persistent atelectasis is noted in the right lung base with associated effusion. IMPRESSION: Tubes and lines as described above. No pneumothorax following central line placement. Stable airspace opacity and effusion in the right base. Electronically Signed   By: Inez Catalina M.D.   On: 11/06/2020 21:03   DG Chest Port 1 View  Result Date: 11/06/2020 CLINICAL DATA:  Dialysis. EXAM: PORTABLE CHEST 1 VIEW COMPARISON:  November 04, 2020. FINDINGS: Stable cardiomediastinal silhouette. Endotracheal and feeding tubes are unchanged in position. Right internal jugular catheter is unchanged. No pneumothorax is noted. Stable bibasilar atelectasis or infiltrates are noted, with probable small right pleural effusion. Bony thorax is unremarkable. IMPRESSION: Stable support apparatus. Stable bibasilar atelectasis or infiltrates are noted, with probable small right  pleural effusion. Electronically Signed   By: Marijo Conception M.D.   On: 11/06/2020 10:42   Medications:  sodium chloride Stopped (11/03/20 2145)   sodium chloride Stopped (10/28/20 0954)   sodium chloride Stopped (11/04/20 0016)   amiodarone 30 mg/hr (11/07/20 1025)    ceFAZolin (ANCEF) IV Stopped (11/07/20 0913)   feeding supplement (VITAL AF 1.2 CAL) Stopped (11/07/20 0700)   heparin 1,850 Units/hr (11/07/20 1000)   HYDROmorphone 2 mg/hr (11/07/20 1000)   norepinephrine (LEVOPHED) Adult infusion Stopped (11/07/20  0934)   propofol (DIPRIVAN) infusion Stopped (11/05/20 0908)   vasopressin 0.03 Units/min (11/07/20 1000)    sodium chloride   Intravenous Once   aspirin  81 mg Per Tube Daily   atorvastatin  80 mg Per Tube q1800   B-complex with vitamin C  1 tablet Per Tube Daily   chlorhexidine gluconate (MEDLINE KIT)  15 mL Mouth Rinse BID   Chlorhexidine Gluconate Cloth  6 each Topical Q0600   clonazepam  1 mg Per Tube BID   docusate  100 mg Per Tube BID   doxercalciferol  5 mcg Intravenous Q M,W,F   feeding supplement (PROSource TF)  45 mL Per Tube QID   insulin aspart  0-6 Units Subcutaneous Q4H   lidocaine  1 patch Transdermal Q24H   mouth rinse  15 mL Mouth Rinse 10 times per day   pantoprazole (PROTONIX) IV  40 mg Intravenous Q12H   polyethylene glycol  17 g Per Tube Daily   QUEtiapine  100 mg Per Tube BID   senna  1 tablet Per Tube QHS   sevelamer carbonate  0.8 g Per Tube TID   sodium chloride flush  10-40 mL Intracatheter Q12H   sodium chloride flush  3 mL Intravenous Q12H   Thrombi-Pad  1 each Topical Once   ticagrelor  90 mg Per Tube BID     OP HD:AF MWF 4h 450/800 81.5kg 2/2.25 bath RUE AVF  ---Hep 5000 units IV initial bolus + 1000 units IVmidrun - hect 5 ug tiw  - mircera 50 ug IV q 4 weeks, last 11/15 (due 11/29)    Assessment/ Plan: 1. Acute STEMI - SP LAD PCI11/20 am, has otherdisease inLCx/ RCA.Transitioned to brilinta from cangrelor. Per cardiology. 2. MSSA cavitary PNA / bacteremia- intubated 11/23. Per PCCM. Respiratory culture 11/23 with MSSA. Blood cx +11/21. CT abd/ pelvis negative 11/23. F/U bcx's negative. Dialysis duplex 11/22 without abscess and [name] of AVF was negative 11/27. Plan for 6 weeks of IV cefazolin per pharmacy.  3. PEA arrest: occurred in setting of agitation/ sedation. Brief CPR.  4. Vomited Emesis that looks and smells like stool. OG in place. 1.5 liters emesis removed.CT Ab/Pelvis with contrast  results pending. Per primary. 5. ESRD - usual HD MWF. ^^LVEDP per initial LHC. Started CRRT 11/24. Now is 10kg down from admit wt and 7 kg under dry wt. Vol excess resolved on exam/ CXR. CRRT DC'd 10/27/2020 however now K+ 5.2. May need to resume CRRT. Rechecking labs at [facility].  6. Shock: on 2 pressors, per CCM 7. Anemia ckd - Hb 7.1 this AM. Transfuse 1 unit PRBCS this AM.  next esa due 11/29 8. MBD ckd - Orally intubated, binders on hold. 9. Afib: on amio and hep gtt, s/p DCCV 10. Severe MR: per cardiology  [place]. Emett Stapel NP-C 11/07/2020, 11:40 AM  [employer] [phone]

## 2020-11-07 NOTE — Progress Notes (Signed)
ANTICOAGULATION CONSULT NOTE  Pharmacy Consult for Heparin Indication: chest pain/ACS with no intervention  Patient Measurements: Height: 5\' 11"  (180.3 cm) Weight: 75.8 kg (167 lb 1.7 oz) IBW/kg (Calculated) : 75.3 Heparin Dosing Weight: 86.4 kg   Vital Signs: Temp: 98.5 F (36.9 C) (11/29 2336) Temp Source: Oral (11/29 2336) BP: 116/53 (11/29 2329) Pulse Rate: 91 (11/30 0215)  Labs: Recent Labs    11/05/20 0246 11/05/20 0246 11/05/20 1002 11/05/20 1230 11/05/20 1736 11/06/20 0205 11/06/20 0205 11/06/20 1041 11/07/20 0241 11/07/20 0242  HGB 8.1*   < >  --   --   --  7.0*   < > 6.9*  --  7.1*  HCT 25.5*   < >  --   --   --  22.2*  --  22.4*  --  23.2*  PLT 377  --   --   --   --  290  --   --   --  246  HEPARINUNFRC 0.21*  --   --  0.24*  --  0.50  --   --   --  0.15*  CREATININE 2.84*  --    < >  --  2.84* 2.87*  --   --  4.81*  --    < > = values in this interval not displayed.    Estimated Creatinine Clearance: 17.8 mL/min (A) (by C-G formula based on SCr of 4.81 mg/dL (H)).  Assessment: 51 yom presenting with STEMI. Known to have dilated ascending aorta, ESRD. Found to have 99% mid LAD with multivessel in LCx and RCA, now s/p DES to LAD. Heparin was started post sheath removal. Patient was found to have bacteremia and will not proceed with cath intervention at this time. Patient developed Afib during admission treated with amiodarone and successful cardioversion.   Noted pt with coffee ground emesis in suction and heparin held 11/29 a.m.   Heparin level 0.15 (subtherapeutic). Spoke with RN and heparin was held 11/29 ~1945 for a couple of hours for central line placement so this may be reason heparin level is low.   Goal of Therapy:  Heparin level 0.3-0.7 units/mL  Monitor platelets by anticoagulation protocol: Yes   Plan:  Continue heparin infusion at 1800 units/hr. Will recheck heparin level at 0600  Sherlon Handing, PharmD, BCPS Please see amion for complete  clinical pharmacist phone list 11/07/2020 3:36 AM

## 2020-11-07 NOTE — Progress Notes (Signed)
Progress Note  Patient Name: Travis [name] Date of Encounter: 11/07/2020  [facility] Cardiologist: Glenetta Hew, MD   Subjective   Rhythm stable  Inpatient Medications    Scheduled Meds: . sodium chloride   Intravenous Once  . aspirin  81 mg Per Tube Daily  . atorvastatin  80 mg Per Tube q1800  . B-complex with vitamin C  1 tablet Per Tube Daily  . bisacodyl  10 mg Rectal BID  . chlorhexidine gluconate (MEDLINE KIT)  15 mL Mouth Rinse BID  . Chlorhexidine Gluconate Cloth  6 each Topical Q0600  . clonazepam  1 mg Per Tube BID  . docusate  100 mg Per Tube BID  . doxercalciferol  5 mcg Intravenous Q M,W,F  . feeding supplement (PROSource TF)  45 mL Per Tube QID  . insulin aspart  0-6 Units Subcutaneous Q4H  . lidocaine  1 patch Transdermal Q24H  . mouth rinse  15 mL Mouth Rinse 10 times per day  . metoCLOPramide (REGLAN) injection  10 mg Intravenous Q12H  . pantoprazole (PROTONIX) IV  40 mg Intravenous Q12H  . polyethylene glycol  17 g Per Tube Daily  . QUEtiapine  100 mg Per Tube BID  . senna  1 tablet Per Tube QHS  . sevelamer carbonate  0.8 g Per Tube TID  . sodium chloride flush  10-40 mL Intracatheter Q12H  . sodium chloride flush  3 mL Intravenous Q12H  . Thrombi-Pad  1 each Topical Once  . ticagrelor  90 mg Per Tube BID   Continuous Infusions: . sodium chloride Stopped (11/03/20 2145)  . sodium chloride Stopped (10/28/20 0954)  . sodium chloride Stopped (11/04/20 0016)  . sodium chloride 75 mL/hr at 11/07/20 1600  . amiodarone 30 mg/hr (11/07/20 1600)  . [START ON 11/08/2020]  ceFAZolin (ANCEF) IV    . feeding supplement (VITAL AF 1.2 CAL) Stopped (11/07/20 0700)  . heparin 1,850 Units/hr (11/07/20 1600)  . HYDROmorphone 2.5 mg/hr (11/07/20 1600)  . norepinephrine (LEVOPHED) Adult infusion Stopped (11/07/20 1505)  . propofol (DIPRIVAN) infusion Stopped (11/05/20 0908)  . vasopressin 0.03 Units/min (11/07/20 1600)   PRN Meds: sodium chloride,  acetaminophen, bisacodyl, HYDROmorphone, HYDROmorphone (DILAUDID) injection, HYDROmorphone (DILAUDID) injection, LORazepam, midazolam, nitroGLYCERIN, ondansetron (ZOFRAN) IV, sodium chloride flush, sodium chloride flush   Vital Signs    Vitals:   11/07/20 1430 11/07/20 1445 11/07/20 1522 11/07/20 1600  BP: 95/61  106/63 (!) 94/57  Pulse: 90 89 94 91  Resp: 19 19 17 17   Temp:   98.9 F (37.2 C)   TempSrc:   Oral Oral  SpO2: 98% 98% 100% 100%  Weight:      Height:        Intake/Output Summary (Last 24 hours) at 11/07/2020 1717 Last data filed at 11/07/2020 1600 Gross per 24 hour  Intake 2154.07 ml  Output 1550 ml  Net 604.07 ml   Last 3 Weights 11/06/2020 11/05/2020 11/04/2020  Weight (lbs) 167 lb 1.7 oz 167 lb 8.8 oz 167 lb 8.8 oz  Weight (kg) 75.8 kg 76 kg 76 kg      Telemetry    NSR - Personally Reviewed  ECG      Physical Exam   GEN: Intubated , sedated Neck: No JVD Cardiac: RRR, no murmurs, rubs, or gallops.  Respiratory: Coarse breath sounds to auscultation bilaterally. GI: Tender MS: No edema; No deformity. Neuro:  Intubated, sedated Psych: Intubated, sedated  Labs    High Sensitivity Troponin:   Recent  Labs  Lab 10/28/20 0629 10/28/20 1046 10/28/20 2040 10/29/20 2121 10/30/20 0548  TROPONINIHS 3,155* >27,000* >27,000* >27,000* >27,000*      Chemistry Recent Labs  Lab 11/05/20 1736 11/05/20 1736 11/06/20 0205 11/06/20 0205 11/07/20 0241 11/07/20 0241 11/07/20 0540 11/07/20 0631 11/07/20 1132  NA 135   < > 135   < > 134*   < > 134* 134* 133*  K 4.1   < > 4.3   < > 4.7   < > 5.0 5.2* 5.1  CL 98   < > 98  --  98  --   --   --  98  CO2 24   < > 22  --  21*  --   --   --  19*  GLUCOSE 133*   < > 162*  --  144*  --   --   --  97  BUN 50*   < > 52*  --  80*  --   --   --  96*  CREATININE 2.84*   < > 2.87*  --  4.81*  --   --   --  5.78*  CALCIUM 9.0   < > 9.1  --  9.4  --   --   --  9.2  ALBUMIN 1.6*  --  1.5*  --  1.4*  --   --   --   --    GFRNONAA 25*   < > 25*  --  13*  --   --   --  11*  ANIONGAP 13   < > 15  --  15  --   --   --  16*   < > = values in this interval not displayed.     Hematology Recent Labs  Lab 11/05/20 0246 11/05/20 0246 11/06/20 0205 11/06/20 1041 11/07/20 0242 11/07/20 0540 11/07/20 0631  WBC 40.3*  --  38.5*  --  28.4*  --   --   RBC 2.58*  --  2.22*  --  2.27*  --   --   HGB 8.1*   < > 7.0*   < > 7.1* 7.8* 7.8*  HCT 25.5*   < > 22.2*   < > 23.2* 23.0* 23.0*  MCV 98.8  --  100.0  --  102.2*  --   --   MCH 31.4  --  31.5  --  31.3  --   --   MCHC 31.8  --  31.5  --  30.6  --   --   RDW 19.6*  --  21.2*  --  21.4*  --   --   PLT 377  --  290  --  246  --   --    < > = values in this interval not displayed.    BNPNo results for input(s): BNP, PROBNP in the last 168 hours.   DDimer No results for input(s): DDIMER in the last 168 hours.   Radiology    DG Abd 1 View  Result Date: 11/07/2020 CLINICAL DATA:  Orogastric tube placement EXAM: ABDOMEN - 1 VIEW COMPARISON:  None. FINDINGS: Orogastric tube tip is seen within the proximal body of the stomach with its proximal side hole seen at the level of gastroesophageal junction. Nasoenteric feeding tube tip noted within the distal stomach in the region of the pylorus. Normal abdominal gas pattern. No gross free intraperitoneal gas. Right basilar atelectasis or infiltrate. IMPRESSION: Orogastric tube tip just within the gastric lumen.  Advancement by 5-10 cm may better position the tube. Nasoenteric feeding tube tip in the region of the pylorus. Advancement of the tube by 10-15 cm should allow eventual passage into the proximal duodenum. Electronically Signed   By: Fidela Salisbury MD   On: 11/07/2020 06:17   MR BRAIN WO CONTRAST  Result Date: 11/06/2020 CLINICAL DATA:  History media, concern for septic emboli. EXAM: MRI HEAD WITHOUT CONTRAST TECHNIQUE: Multiplanar, multiecho pulse sequences of the brain and surrounding structures were obtained  without intravenous contrast. COMPARISON:  Brain MRI/MRA 11/13/2012. FINDINGS: Brain: Cerebral volume is normal. Minimal multifocal T2/FLAIR hyperintensity within the cerebral white matter is nonspecific, but compatible with chronic small vessel ischemic disease. There is no acute infarct. No evidence of intracranial mass. No chronic intracranial blood products. No extra-axial fluid collection. No midline shift. Vascular: Expected proximal arterial flow voids. Skull and upper cervical spine: No focal suspicious marrow lesion. Sinuses/Orbits: Visualized orbits show no acute finding. Mild ethmoid and left sphenoid sinus mucosal thickening. Small volume frothy secretions are also present within the left sphenoid sinus. Other: Bilateral mastoid effusions. Partially visualized support tubes. Secretions within the nasopharynx and oropharynx. IMPRESSION: No non-contrast evidence of acute intracranial abnormality. Mild cerebral white matter chronic small vessel ischemic disease. Ethmoid and left sphenoid sinusitis. Bilateral mastoid effusions. Electronically Signed   By: Kellie Simmering DO   On: 11/06/2020 16:23   MR CERVICAL SPINE WO CONTRAST  Result Date: 11/06/2020 CLINICAL DATA:  Neck pain, acute, infection suspected; bacteremia, not moving lower extremities. EXAM: MRI CERVICAL SPINE WITHOUT CONTRAST TECHNIQUE: Multiplanar, multisequence MR imaging of the cervical spine was performed. No intravenous contrast was administered. COMPARISON:  No pertinent prior exams available for comparison. FINDINGS: Alignment: No significant spondylolisthesis. Vertebrae: Diffuse abnormal hypointense marrow signal throughout the cervical and visualized upper thoracic spine, nonspecific but likely related to the patient's known end-stage renal disease. Trace edema along the C6 inferior endplate is nonspecific, but likely degenerative. No significant marrow edema is identified elsewhere within the cervical spine. Cord: No spinal cord  signal abnormality Posterior Fossa, vertebral arteries, paraspinal tissues: Posterior fossa better assessed on the concurrently performed brain MRI. Flow voids preserved within the imaged cervical vertebral arteries. Partially imaged life support tubes. Prominent secretions within the nasopharynx and oropharynx. Paraspinal soft tissues unremarkable. Disc levels: Mild multilevel disc degeneration greatest at C6-C7. C2-C3: Disc osteophyte ridge/uncinate hypertrophy on the right. Minimal partial effacement of the ventral thecal sac on the right without significant central canal stenosis. Mild right neural foraminal narrowing. C3-C4: Mild uncinate and facet hypertrophy on the right. No significant disc herniation or spinal canal stenosis. Mild right neural foraminal narrowing. Trace left facet joint effusion (series 8, image 16). C4-C5: Uncinate hypertrophy (greater on the right). Minimal facet hypertrophy. No significant disc herniation or spinal canal stenosis. Moderate/severe neural foraminal narrowing. Small left facet joint effusion (series 8, image 21). C5-C6: Left center disc protrusion (series 9, image 26). Uncinate hypertrophy (greater on the right). Minimal facet hypertrophy. The disc protrusion contributes to mild relative spinal canal narrowing. Moderate right neural foraminal narrowing. Trace left facet joint effusion (series 8, image 25) C6-C7: Shallow broad-based left center disc protrusion. Mild uncovertebral and facet hypertrophy. No significant spinal canal stenosis. Bilateral neural foraminal narrowing (moderate right, mild left). Small left facet joint effusion (series 8, image 30). C7-T1: No significant disc herniation or stenosis. Trace left facet joint effusion. IMPRESSION: Trace or small facet joint effusions on the left at C3-C4, C4-C5, C5-C6, C6-C7 and C7-T1 are nonspecific  and may be degenerative in etiology. Given the provided history of bacteremia, facet joint septic arthritis is difficult  to definitively exclude, but the lack of any surrounding marrow edema argues against this. Minimal edema along the C6 inferior endplate is likely degenerative. Cervical spondylosis as outline. At C5-C6, a left center disc protrusion contributes to mild relative spinal canal narrowing. No significant canal stenosis at the remaining levels. Multilevel neural foraminal narrowing greatest on the right at C4-C5 (moderate/severe), on the right at C5-C6 (moderate) and on the right at C6-C7 (moderate). Diffuse abnormal T1 hypointense marrow signal, nonspecific but likely related to the patient's end-stage renal disease. Electronically Signed   By: Kellie Simmering DO   On: 11/06/2020 16:39   MR THORACIC SPINE WO CONTRAST  Result Date: 11/06/2020 CLINICAL DATA:  Mid back pain; bacteremia, not moving lower extremity. EXAM: MRI THORACIC SPINE WITHOUT CONTRAST TECHNIQUE: Multiplanar, multisequence MR imaging of the thoracic spine was performed. No intravenous contrast was administered. COMPARISON:  CT chest 10/31/2020. FINDINGS: Mildly motion degraded examination. Alignment: Thoracic levocurvature. No significant spondylolisthesis. Vertebrae: Vertebral body height is maintained. Multilevel degenerative endplate irregularity and small Schmorl nodes. Mild edema along the T6 inferior endplate, at site of a Schmorl node, likely degenerative. No significant marrow edema identified elsewhere within the thoracic spine. Diffuse hypointense marrow signal throughout the thoracic spine, nonspecific but likely related to the patient's end-stage renal disease. Cord: Within the limitations of motion degradation, there is no convincing spinal cord signal abnormality. Paraspinal and other soft tissues: Paraspinal soft tissues within normal limits. Incompletely assessed multifocal airspace disease and cavitary pulmonary lesions. Small right pleural effusion. 13 mm T2 hyperintense lesion within the hepatic dome, incompletely assessed on this  noncontrast examination. Disc levels: No more than mild disc degeneration at any level. No focal disc herniation, spinal canal stenosis or neural foraminal narrowing at any level. IMPRESSION: Mild edema along the T6 inferior endplate, at site of a Schmorl node, likely degenerative. No significant marrow edema elsewhere within the thoracic spine. Mild thoracic spondylosis. No focal disc herniation, spinal canal stenosis or neural foraminal narrowing at any level. Diffuse hypointense marrow signal throughout the thoracic spine, likely related to the patient's end-stage renal disease. Incompletely assessed multifocal airspace disease and cavitary pulmonary lesions. Small right pleural effusion. 13 mm T2 hyperintense lesion within the hepatic dome, incompletely assessed on this non-contrast examination. Electronically Signed   By: Kellie Simmering DO   On: 11/06/2020 17:05   MR LUMBAR SPINE WO CONTRAST  Result Date: 11/06/2020 CLINICAL DATA:  Back pain, infection suspected; bacteremia, not moving legs. EXAM: MRI LUMBAR SPINE WITHOUT CONTRAST TECHNIQUE: Multiplanar, multisequence MR imaging of the lumbar spine was performed. No intravenous contrast was administered. COMPARISON:  CT abdomen/pelvis 11/01/2020. FINDINGS: Segmentation:  5 lumbar vertebrae. Alignment:  No significant spondylolisthesis. Vertebrae: Vertebral body height is maintained. Multilevel degenerative endplate irregularity with small Schmorl nodes. There is trace multilevel endplate edema, largely adjacent to Schmorl nodes, and findings are likely degenerative in etiology. No suspicious vertebral body marrow edema to suggest discitis/osteomyelitis on the current exam. Mild marrow edema along the L4-L5 and L5-S1 facet joints. Diffuse hypointense marrow signal throughout the lumbar spine nonspecific but likely related to the patient's end-stage renal disease. Conus medullaris and cauda equina: Conus extends to the L1 level. Conus and cauda equina appear  normal. Paraspinal and other soft tissues: Layering hypointense signal within the gallbladder likely reflecting cholecystolithiasis. Nonspecific edema signal within the lumbar paraspinal soft tissues. Disc levels: No more than mild  disc degeneration at any level. No significant disc herniation, spinal canal stenosis or neural foraminal narrowing at the T12-L1 through L3-L4 levels. L4-L5: Small disc bulge. Facet arthrosis with ligamentum flavum hypertrophy. Trace bilateral facet joint effusions. No significant spinal canal stenosis or neural foraminal narrowing. L5-S1: Disc bulge. Superimposed broad-based central disc protrusion eccentric to the right at site of posterior annular fissure. Facet arthrosis with mild ligamentum flavum hypertrophy. Mild right subarticular narrowing with the disc protrusion likely contacting the descending right S1 nerve root. Central canal patent. Mild relative left neural foraminal narrowing. IMPRESSION: Trace bilateral L4-L5 facet joint effusions with mild surrounding marrow edema. Findings are nonspecific and could certainly be degenerative given the facet arthrosis present at this level. Facet joint septic arthrosis is difficult to definitively exclude given the provided history of bacteremia. Mild marrow edema along the bilateral L5-S1 facet joints is nonspecific, but favored degenerative given the significant facet arthrosis at this level and lack of facet joint effusions. Trace multilevel endplate edema appears degenerative. Lumbar spondylosis as outlined. No more than mild spinal canal or neural foraminal narrowing at any level. Most notably, and L5-S1 disc protrusion contributes to multifactorial mild right subarticular narrowing and contacts the descending right S1 nerve root. Diffuse hypointense marrow signal likely related to the patient's end-stage renal disease. Nonspecific edema within the bilateral lumbar paraspinal soft tissues. Suspected cholecystolithiasis.  Electronically Signed   By: Kellie Simmering DO   On: 11/06/2020 16:53   CT ABDOMEN PELVIS W CONTRAST  Result Date: 11/07/2020 CLINICAL DATA:  Nausea and vomiting EXAM: CT ABDOMEN AND PELVIS WITH CONTRAST TECHNIQUE: Multidetector CT imaging of the abdomen and pelvis was performed using the standard protocol following bolus administration of intravenous contrast. CONTRAST:  16m OMNIPAQUE IOHEXOL 300 MG/ML  SOLN COMPARISON:  Multiple exams, including 11/01/2020 and abdominal radiograph from 11/07/2020 FINDINGS: Lower chest: Coronary atherosclerosis and mild cardiomegaly. Small right pleural effusion. Continued bibasilar airspace opacities with air bronchograms especially posteriorly in the lower lobes, some of the right middle lobe airspace opacity has resolved. Hepatobiliary: Dependent density in the gallbladder possibly probably from prior contrast excretion. On prior MRI thoracic spine there was a T2 hyperintense lesion in the dome of the right hepatic lobe posteriorly, this is not readily apparent on today's CT scan although this partly may be from streak artifact related to the patient's arm positioning. Pancreas: Mild dorsal pancreatic duct dilatation is observed in the pancreatic body and head, up to 0.4 cm diameter. No specific cause is identified. Spleen: Unremarkable Adrenals/Urinary Tract: Adrenal glands unremarkable. Right nephrectomy. Atrophic left kidney with multiple cysts, including a rim calcified lesion which is unchanged. Empty urinary bladder. Stomach/Bowel: Nasogastric tube terminates the stomach body and a feeding tube terminates in the stomach antrum. Scattered diverticula of the colon most concentrated in the ascending and sigmoid colon. No dilated bowel. Normal appendix. Vascular/Lymphatic: Aortoiliac atherosclerotic vascular disease. Mild stranding around the right common femoral artery likely related to recent catheterization common no substantial or masslike hematoma identified.  Reproductive: Borderline prostatomegaly. Other: No supplemental non-categorized findings. Musculoskeletal: Degenerative disc disease and degenerative facet arthropathy in the lower lumbar spine. IMPRESSION: 1. A specific cause for the patient's nausea and vomiting is not identified. 2. Continued bibasilar airspace opacities with air bronchograms especially posteriorly in the lower lobes, some of the right middle lobe airspace opacity has resolved. Small right pleural effusion. 3. Mild dorsal pancreatic duct dilatation in the pancreatic body and head, up to 0.4 cm diameter. No specific cause is identified. 4.  Atrophic left kidney with multiple cysts, including a rim calcified lesion which is unchanged. 5. The T2 hyperintense lesion in segment 7 of the liver on prior thoracic spine MRI is not well seen on today's CT scan. The region is partially obscured by streak artifact from the patient's arms. 6. Other imaging findings of potential clinical significance: Coronary atherosclerosis and mild cardiomegaly. Borderline prostatomegaly. Degenerative disc disease and degenerative facet arthropathy in the lower lumbar spine. 7. Aortic atherosclerosis. Aortic Atherosclerosis (ICD10-I70.0). Electronically Signed   By: Van Clines M.D.   On: 11/07/2020 11:53   DG CHEST PORT 1 VIEW  Result Date: 11/06/2020 CLINICAL DATA:  Status post endotracheal intubation EXAM: PORTABLE CHEST 1 VIEW COMPARISON:  Film from earlier in the same day. FINDINGS: Endotracheal tube is again identified and stable. Right jugular temporary dialysis catheter is noted. Feeding catheter is seen extending into the stomach. New left subclavian central line is noted with the catheter tip at the confluence of the innominate veins. No pneumothorax is seen. Patchy persistent atelectasis is noted in the right lung base with associated effusion. IMPRESSION: Tubes and lines as described above. No pneumothorax following central line placement. Stable  airspace opacity and effusion in the right base. Electronically Signed   By: Inez Catalina M.D.   On: 11/06/2020 21:03   DG Chest Port 1 View  Result Date: 11/06/2020 CLINICAL DATA:  Dialysis. EXAM: PORTABLE CHEST 1 VIEW COMPARISON:  November 04, 2020. FINDINGS: Stable cardiomediastinal silhouette. Endotracheal and feeding tubes are unchanged in position. Right internal jugular catheter is unchanged. No pneumothorax is noted. Stable bibasilar atelectasis or infiltrates are noted, with probable small right pleural effusion. Bony thorax is unremarkable. IMPRESSION: Stable support apparatus. Stable bibasilar atelectasis or infiltrates are noted, with probable small right pleural effusion. Electronically Signed   By: Marijo Conception M.D.   On: 11/06/2020 10:42    Cardiac Studies     Patient Profile     59 y.o. 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  With ileus now.  If there is a concern for absorption. Would need to consider Cangrelor IV.  Continue IV heparin at this time.   AFib:  NSR on lower dose of IV Amio.  Continue IV Amio for now.   Findings discussed with family at bedside.   For questions or updates, please contact [place] Please consult [email or web address] for contact info under        Signed, Larae Grooms, MD  11/07/2020, 5:17 PM

## 2020-11-07 NOTE — Progress Notes (Signed)
eLink Physician-Brief Progress Note Patient Name: Travis [name] DOB: 08/07/1961 MRN: [id number]   Date of Service  11/07/2020  HPI/Events of Note  Notified of stool appearing output from mouth. No BM since 11/20. Abdomen distended. Patient with cortrack  eICU Interventions  Ordered OGT to decompress KUB ordered     Intervention Category Major Interventions: Other:  Judd Lien 11/07/2020, 5:02 AM

## 2020-11-07 NOTE — Progress Notes (Signed)
Brief Progress Note  CT scan results reviewed. Suspect this is an illeus, as no free air and no obvious cause of nausea and vomiting.  Mild dorsal pancreatic duct dilatation in the pancreatic body and head, up to 0.4 cm diameter Gall Bladder is enlarged. Plan Will order RUQ [name] to better evaluate the Gall Bladder and Pancreas Will add Reglan IV, and scheduled Dulcolax suppositories to se if we can get his bowels moving.  Will hold TF for now, but will continue meds per Cortrack ( Clamp OG x 1 hour after giving meds per tube) Nursing to watch blood sugars while TF off.  Plan to wean Dilaudid once [name] is completed.   Magdalen Spatz, MSN, AGACNP-BC [place] for personal pager PCCM on call pager [phone] 11/07/2020 2:07 PM

## 2020-11-08 ENCOUNTER — Inpatient Hospital Stay (HOSPITAL_COMMUNITY): Payer: Medicare Other

## 2020-11-08 DIAGNOSIS — I2109 ST elevation (STEMI) myocardial infarction involving other coronary artery of anterior wall: Secondary | ICD-10-CM | POA: Diagnosis not present

## 2020-11-08 DIAGNOSIS — K567 Ileus, unspecified: Secondary | ICD-10-CM

## 2020-11-08 DIAGNOSIS — I5023 Acute on chronic systolic (congestive) heart failure: Secondary | ICD-10-CM

## 2020-11-08 LAB — BLOOD CULTURE ID PANEL (REFLEXED) - BCID2

## 2020-11-08 LAB — RENAL FUNCTION PANEL
Albumin: 1.3 g/dL — ABNORMAL LOW (ref 3.5–5.0)
Anion gap: 20 — ABNORMAL HIGH (ref 5–15)
BUN: 94 mg/dL — ABNORMAL HIGH (ref 6–20)
CO2: 16 mmol/L — ABNORMAL LOW (ref 22–32)
Calcium: 9.3 mg/dL (ref 8.9–10.3)
Chloride: 99 mmol/L (ref 98–111)
Creatinine, Ser: 6.94 mg/dL — ABNORMAL HIGH (ref 0.61–1.24)
GFR, Estimated: 9 mL/min — ABNORMAL LOW (ref >60)
Glucose, Bld: 111 mg/dL — ABNORMAL HIGH (ref 70–99)
Phosphorus: 8.9 mg/dL — ABNORMAL HIGH (ref 2.5–4.6)
Potassium: 5.4 mmol/L — ABNORMAL HIGH (ref 3.5–5.1)
Sodium: 135 mmol/L (ref 135–145)

## 2020-11-08 LAB — GLUCOSE, CAPILLARY
Glucose-Capillary: 117 mg/dL — ABNORMAL HIGH (ref 70–99)
Glucose-Capillary: 161 mg/dL — ABNORMAL HIGH (ref 70–99)
Glucose-Capillary: 74 mg/dL (ref 70–99)
Glucose-Capillary: 76 mg/dL (ref 70–99)
Glucose-Capillary: 82 mg/dL (ref 70–99)
Glucose-Capillary: 85 mg/dL (ref 70–99)
Glucose-Capillary: 88 mg/dL (ref 70–99)
Glucose-Capillary: 97 mg/dL (ref 70–99)

## 2020-11-08 LAB — CBC
HCT: 22.8 % — ABNORMAL LOW (ref 39.0–52.0)
HCT: 24.3 % — ABNORMAL LOW (ref 39.0–52.0)
Hemoglobin: 7.1 g/dL — ABNORMAL LOW (ref 13.0–17.0)
Hemoglobin: 7.7 g/dL — ABNORMAL LOW (ref 13.0–17.0)
MCH: 31 pg (ref 26.0–34.0)
MCH: 30.6 pg (ref 26.0–34.0)
MCHC: 31.1 g/dL (ref 30.0–36.0)
MCHC: 31.7 g/dL (ref 30.0–36.0)
MCV: 99.6 fL (ref 80.0–100.0)
MCV: 96.4 fL (ref 80.0–100.0)
Platelets: 192 10*3/uL (ref 150–400)
Platelets: 207 10*3/uL (ref 150–400)
RBC: 2.29 MIL/uL — ABNORMAL LOW (ref 4.22–5.81)
RBC: 2.52 MIL/uL — ABNORMAL LOW (ref 4.22–5.81)
RDW: 21.3 % — ABNORMAL HIGH (ref 11.5–15.5)
RDW: 20.7 % — ABNORMAL HIGH (ref 11.5–15.5)
WBC: 19.8 10*3/uL — ABNORMAL HIGH (ref 4.0–10.5)
WBC: 24 10*3/uL — ABNORMAL HIGH (ref 4.0–10.5)
nRBC: 0.3 % — ABNORMAL HIGH (ref 0.0–0.2)
nRBC: 0.2 % (ref 0.0–0.2)

## 2020-11-08 LAB — COMPREHENSIVE METABOLIC PANEL
ALT: 5 U/L (ref 0–44)
AST: 53 U/L — ABNORMAL HIGH (ref 15–41)
Albumin: 1.3 g/dL — ABNORMAL LOW (ref 3.5–5.0)
Alkaline Phosphatase: 137 U/L — ABNORMAL HIGH (ref 38–126)
Anion gap: 17 — ABNORMAL HIGH (ref 5–15)
BUN: 112 mg/dL — ABNORMAL HIGH (ref 6–20)
CO2: 16 mmol/L — ABNORMAL LOW (ref 22–32)
Calcium: 9.4 mg/dL (ref 8.9–10.3)
Chloride: 101 mmol/L (ref 98–111)
Creatinine, Ser: 7.43 mg/dL — ABNORMAL HIGH (ref 0.61–1.24)
GFR, Estimated: 8 mL/min — ABNORMAL LOW (ref >60)
Glucose, Bld: 89 mg/dL (ref 70–99)
Potassium: 5.5 mmol/L — ABNORMAL HIGH (ref 3.5–5.1)
Sodium: 134 mmol/L — ABNORMAL LOW (ref 135–145)
Total Bilirubin: 1.9 mg/dL — ABNORMAL HIGH (ref 0.3–1.2)
Total Protein: 5.8 g/dL — ABNORMAL LOW (ref 6.5–8.1)

## 2020-11-08 LAB — LACTIC ACID, PLASMA: Lactic Acid, Venous: 0.5 mmol/L (ref 0.5–1.9)

## 2020-11-08 LAB — HEMOGLOBIN AND HEMATOCRIT, BLOOD
HCT: 26.5 % — ABNORMAL LOW (ref 39.0–52.0)
HCT: 26.6 % — ABNORMAL LOW (ref 39.0–52.0)
Hemoglobin: 8.6 g/dL — ABNORMAL LOW (ref 13.0–17.0)
Hemoglobin: 8.6 g/dL — ABNORMAL LOW (ref 13.0–17.0)

## 2020-11-08 LAB — PREPARE RBC (CROSSMATCH)

## 2020-11-08 LAB — HEPARIN LEVEL (UNFRACTIONATED)
Heparin Unfractionated: 0.59 IU/mL (ref 0.30–0.70)
Heparin Unfractionated: 0.54 IU/mL (ref 0.30–0.70)

## 2020-11-08 LAB — MAGNESIUM: Magnesium: 2.7 mg/dL — ABNORMAL HIGH (ref 1.7–2.4)

## 2020-11-08 LAB — PHOSPHORUS: Phosphorus: 7.8 mg/dL — ABNORMAL HIGH (ref 2.5–4.6)

## 2020-11-08 MED ORDER — SODIUM CHLORIDE 0.9% IV SOLUTION: Freq: Once | INTRAVENOUS | Status: AC

## 2020-11-08 MED ORDER — SODIUM CHLORIDE 0.9 % IV SOLN: 100.0000 mL | INTRAVENOUS | Status: DC | PRN

## 2020-11-08 MED ORDER — LIDOCAINE HCL (PF) 1 % IJ SOLN: 5.0000 mL | INTRAMUSCULAR | Status: DC | PRN

## 2020-11-08 MED ORDER — EPINEPHRINE HCL 5 MG/250ML IV SOLN IN NS
INTRAVENOUS | Status: AC
  Filled 2020-11-08: qty 250

## 2020-11-08 MED ORDER — SODIUM CHLORIDE 0.9 % FOR CRRT: INTRAVENOUS_CENTRAL | Status: DC | PRN

## 2020-11-08 MED ORDER — PENTAFLUOROPROP-TETRAFLUOROETH EX AERO: 1.0000 "application " | INHALATION_SPRAY | CUTANEOUS | Status: DC | PRN

## 2020-11-08 MED ORDER — CEFAZOLIN SODIUM-DEXTROSE 2-4 GM/100ML-% IV SOLN
2.0000 g | Freq: Two times a day (BID) | INTRAVENOUS | Status: DC
  Administered 2020-11-08 – 2020-11-12 (×8): 2 g via INTRAVENOUS
  Filled 2020-11-08 (×11): qty 100

## 2020-11-08 MED ORDER — PRISMASOL BGK 4/2.5 32-4-2.5 MEQ/L REPLACEMENT SOLN: Status: DC

## 2020-11-08 MED ORDER — PRISMASOL BGK 4/2.5 32-4-2.5 MEQ/L EC SOLN
Status: DC
  Administered 2020-11-10: 1800 mL/h via INTRAVENOUS_CENTRAL

## 2020-11-08 MED ORDER — ALTEPLASE 2 MG IJ SOLR: 2.0000 mg | Freq: Once | INTRAMUSCULAR | Status: DC | PRN

## 2020-11-08 MED ORDER — SORBITOL 70 % SOLN
960.0000 mL | TOPICAL_OIL | Freq: Once | ORAL | Status: AC
  Administered 2020-11-08: 960 mL via RECTAL
  Filled 2020-11-08: qty 473

## 2020-11-08 MED ORDER — MIDAZOLAM HCL 2 MG/2ML IJ SOLN
2.0000 mg | Freq: Once | INTRAMUSCULAR | Status: AC
  Administered 2020-11-08: 2 mg via INTRAVENOUS

## 2020-11-08 MED ORDER — HEPARIN SODIUM (PORCINE) 1000 UNIT/ML DIALYSIS
1000.0000 [IU] | INTRAMUSCULAR | Status: DC | PRN
  Administered 2020-11-11: 2800 [IU] via INTRAVENOUS_CENTRAL
  Filled 2020-11-08: qty 6
  Filled 2020-11-08: qty 4
  Filled 2020-11-08: qty 6

## 2020-11-08 MED ORDER — VANCOMYCIN HCL 1500 MG/300ML IV SOLN
1500.0000 mg | Freq: Once | INTRAVENOUS | Status: AC
  Administered 2020-11-08: 1500 mg via INTRAVENOUS
  Filled 2020-11-08: qty 300

## 2020-11-08 MED ORDER — EPINEPHRINE HCL 5 MG/250ML IV SOLN IN NS
0.5000 ug/min | INTRAVENOUS | Status: DC
  Administered 2020-11-08: 20:00:00 20 ug/min via INTRAVENOUS
  Administered 2020-11-08: 14:00:00 0.5 ug/min via INTRAVENOUS
  Administered 2020-11-08: 20 ug/min via INTRAVENOUS
  Filled 2020-11-08 (×2): qty 250

## 2020-11-08 MED ORDER — LIDOCAINE-PRILOCAINE 2.5-2.5 % EX CREA
1.0000 "application " | TOPICAL_CREAM | CUTANEOUS | Status: DC | PRN
  Filled 2020-11-08: qty 5

## 2020-11-08 MED ORDER — CEFAZOLIN SODIUM-DEXTROSE 1-4 GM/50ML-% IV SOLN: 1.0000 g | INTRAVENOUS | Status: DC

## 2020-11-08 MED FILL — Medication: Qty: 1 | Status: AC

## 2020-11-08 NOTE — Progress Notes (Signed)
CPT held this round due to patient was agitated and had to receive medications and there is concern for BP per RN.

## 2020-11-08 NOTE — Procedures (Addendum)
Cortrak  Person Inserting Tube:  Travis [name], RD Tube Type:  Cortrak - 43 inches Tube Location:  Left nare Initial Placement:  Stomach Secured by: Bridle Technique Used to Measure Tube Placement:  Documented cm marking at nare/ corner of mouth Cortrak Secured At:  100 cm    Cortrak Tube Team Note:  Consult received to advance Cortrak feeding tube post-pyloric. Found tube clipped at 83 cm at left nare. Advanced tube to 100 cm and clipped.   X-ray is required, abdominal x-ray has been ordered by the Cortrak team. Please confirm tube placement before using the Cortrak tube.   Addendum: Per abdominal x-ray Cortrak tube is coiled in stomach. Per MD plan is to leave tube in place at this time.  If the tube becomes dislodged please keep the tube and contact the Cortrak team at [email or web address] (password [email or web address]) for replacement.  If after hours and replacement cannot be delayed, place a NG tube and confirm placement with an abdominal x-ray.    Travis Barnacle, MS, RD, LDN Pager number available on Amion

## 2020-11-08 NOTE — Progress Notes (Signed)
[facility] Progress Note   Subjective: No significant events overnight. Still agitated, requiring sedation. HGB down to 7.1. Plan IHD today.   Objective Vitals:   11/08/20 0600 11/08/20 0700 11/08/20 0802 11/08/20 0806  BP: 108/66 115/65 (!) 185/70   Pulse: 86 85 96   Resp: 18 19 (!) 28   Temp:    98.4 F (36.9 C)  TempSrc:    Axillary  SpO2: 96% 96% 99%   Weight:      Height:       Physical Exam General: Critically ill male intubated, sedated on vent Heart: S1,S2 No M/R/G  Lungs: Bilateral breath sounds, symmetrical chest excursions. Coarse breath sounds, decreased in bases.  Abdomen: No BS. OG with red-Enzley Kitchens drainage.  Extremities: Trace BLE edema. BLE heel protectors.  Dialysis Access: L AVF + T/B Temp cath in place.     Additional Objective Labs: Basic Metabolic Panel: Recent Labs  Lab 11/06/20 0205 11/06/20 0205 11/07/20 0241 11/07/20 0540 11/07/20 0631 11/07/20 1132 11/08/20 0347  NA 135   < > 134*   < > 134* 133* 134*  K 4.3   < > 4.7   < > 5.2* 5.1 5.5*  CL 98   < > 98  --   --  98 101  CO2 22   < > 21*  --   --  19* 16*  GLUCOSE 162*   < > 144*  --   --  97 89  BUN 52*   < > 80*  --   --  96* 112*  CREATININE 2.87*   < > 4.81*  --   --  5.78* 7.43*  CALCIUM 9.1   < > 9.4  --   --  9.2 9.4  PHOS 3.8  --  5.2*  --   --   --  7.8*   < > = values in this interval not displayed.   Liver Function Tests: Recent Labs  Lab 11/06/20 0205 11/07/20 0241 11/08/20 0347  AST  --   --  53*  ALT  --   --  PENDING  ALKPHOS  --   --  137*  BILITOT  --   --  1.9*  PROT  --   --  5.8*  ALBUMIN 1.5* 1.4* 1.3*   No results for input(s): LIPASE, AMYLASE in the last 168 hours. CBC: Recent Labs  Lab 11/04/20 0429 11/04/20 0429 11/05/20 0246 11/05/20 0246 11/06/20 0205 11/06/20 1041 11/07/20 0242 11/07/20 0242 11/07/20 0540 11/07/20 0631 11/08/20 0347  WBC 30.5*   < > 40.3*   < > 38.5*  --  28.4*  --   --   --  19.8*  HGB 7.4*   < > 8.1*    < > 7.0*   < > 7.1*   < > 7.8* 7.8* 7.1*  HCT 22.5*   < > 25.5*   < > 22.2*   < > 23.2*   < > 23.0* 23.0* 22.8*  MCV 99.1  --  98.8  --  100.0  --  102.2*  --   --   --  99.6  PLT 304   < > 377   < > 290  --  246  --   --   --  192   < > = values in this interval not displayed.   Blood Culture    Component Value Date/Time   SDES BLOOD RIGHT HAND 11/07/2020 0045   SPECREQUEST  11/07/2020 0045  BOTTLES DRAWN AEROBIC ONLY Blood Culture adequate volume   CULT  11/07/2020 0045    NO GROWTH < 12 HOURS Performed at [facility], [place] [address] [place], [place] [address]    REPTSTATUS PENDING 11/07/2020 0045    Cardiac Enzymes: No results for input(s): CKTOTAL, CKMB, CKMBINDEX, TROPONINI in the last 168 hours. CBG: Recent Labs  Lab 11/07/20 1611 11/07/20 2056 11/08/20 0017 11/08/20 0340 11/08/20 0804  GLUCAP 88 88 85 76 74   Iron Studies: No results for input(s): IRON, TIBC, TRANSFERRIN, FERRITIN in the last 72 hours. @lablastinr3 @ Studies/Results: DG Abd 1 View  Result Date: 11/07/2020 CLINICAL DATA:  Orogastric tube placement EXAM: ABDOMEN - 1 VIEW COMPARISON:  None. FINDINGS: Orogastric tube tip is seen within the proximal body of the stomach with its proximal side hole seen at the level of gastroesophageal junction. Nasoenteric feeding tube tip noted within the distal stomach in the region of the pylorus. Normal abdominal gas pattern. No gross free intraperitoneal gas. Right basilar atelectasis or infiltrate. IMPRESSION: Orogastric tube tip just within the gastric lumen. Advancement by 5-10 cm may better position the tube. Nasoenteric feeding tube tip in the region of the pylorus. Advancement of the tube by 10-15 cm should allow eventual passage into the proximal duodenum. Electronically Signed   By: Fidela Salisbury MD   On: 11/07/2020 06:17   MR BRAIN WO CONTRAST  Result Date: 11/06/2020 CLINICAL DATA:  History media, concern for septic emboli. EXAM: MRI HEAD WITHOUT  CONTRAST TECHNIQUE: Multiplanar, multiecho pulse sequences of the brain and surrounding structures were obtained without intravenous contrast. COMPARISON:  Brain MRI/MRA 11/13/2012. FINDINGS: Brain: Cerebral volume is normal. Minimal multifocal T2/FLAIR hyperintensity within the cerebral white matter is nonspecific, but compatible with chronic small vessel ischemic disease. There is no acute infarct. No evidence of intracranial mass. No chronic intracranial blood products. No extra-axial fluid collection. No midline shift. Vascular: Expected proximal arterial flow voids. Skull and upper cervical spine: No focal suspicious marrow lesion. Sinuses/Orbits: Visualized orbits show no acute finding. Mild ethmoid and left sphenoid sinus mucosal thickening. Small volume frothy secretions are also present within the left sphenoid sinus. Other: Bilateral mastoid effusions. Partially visualized support tubes. Secretions within the nasopharynx and oropharynx. IMPRESSION: No non-contrast evidence of acute intracranial abnormality. Mild cerebral white matter chronic small vessel ischemic disease. Ethmoid and left sphenoid sinusitis. Bilateral mastoid effusions. Electronically Signed   By: Kellie Simmering DO   On: 11/06/2020 16:23   MR CERVICAL SPINE WO CONTRAST  Result Date: 11/06/2020 CLINICAL DATA:  Neck pain, acute, infection suspected; bacteremia, not moving lower extremities. EXAM: MRI CERVICAL SPINE WITHOUT CONTRAST TECHNIQUE: Multiplanar, multisequence MR imaging of the cervical spine was performed. No intravenous contrast was administered. COMPARISON:  No pertinent prior exams available for comparison. FINDINGS: Alignment: No significant spondylolisthesis. Vertebrae: Diffuse abnormal hypointense marrow signal throughout the cervical and visualized upper thoracic spine, nonspecific but likely related to the patient's known end-stage renal disease. Trace edema along the C6 inferior endplate is nonspecific, but likely  degenerative. No significant marrow edema is identified elsewhere within the cervical spine. Cord: No spinal cord signal abnormality Posterior Fossa, vertebral arteries, paraspinal tissues: Posterior fossa better assessed on the concurrently performed brain MRI. Flow voids preserved within the imaged cervical vertebral arteries. Partially imaged life support tubes. Prominent secretions within the nasopharynx and oropharynx. Paraspinal soft tissues unremarkable. Disc levels: Mild multilevel disc degeneration greatest at C6-C7. C2-C3: Disc osteophyte ridge/uncinate hypertrophy on the right. Minimal partial effacement of  the ventral thecal sac on the right without significant central canal stenosis. Mild right neural foraminal narrowing. C3-C4: Mild uncinate and facet hypertrophy on the right. No significant disc herniation or spinal canal stenosis. Mild right neural foraminal narrowing. Trace left facet joint effusion (series 8, image 16). C4-C5: Uncinate hypertrophy (greater on the right). Minimal facet hypertrophy. No significant disc herniation or spinal canal stenosis. Moderate/severe neural foraminal narrowing. Small left facet joint effusion (series 8, image 21). C5-C6: Left center disc protrusion (series 9, image 26). Uncinate hypertrophy (greater on the right). Minimal facet hypertrophy. The disc protrusion contributes to mild relative spinal canal narrowing. Moderate right neural foraminal narrowing. Trace left facet joint effusion (series 8, image 25) C6-C7: Shallow broad-based left center disc protrusion. Mild uncovertebral and facet hypertrophy. No significant spinal canal stenosis. Bilateral neural foraminal narrowing (moderate right, mild left). Small left facet joint effusion (series 8, image 30). C7-T1: No significant disc herniation or stenosis. Trace left facet joint effusion. IMPRESSION: Trace or small facet joint effusions on the left at C3-C4, C4-C5, C5-C6, C6-C7 and C7-T1 are nonspecific and may  be degenerative in etiology. Given the provided history of bacteremia, facet joint septic arthritis is difficult to definitively exclude, but the lack of any surrounding marrow edema argues against this. Minimal edema along the C6 inferior endplate is likely degenerative. Cervical spondylosis as outline. At C5-C6, a left center disc protrusion contributes to mild relative spinal canal narrowing. No significant canal stenosis at the remaining levels. Multilevel neural foraminal narrowing greatest on the right at C4-C5 (moderate/severe), on the right at C5-C6 (moderate) and on the right at C6-C7 (moderate). Diffuse abnormal T1 hypointense marrow signal, nonspecific but likely related to the patient's end-stage renal disease. Electronically Signed   By: Kellie Simmering DO   On: 11/06/2020 16:39   MR THORACIC SPINE WO CONTRAST  Result Date: 11/06/2020 CLINICAL DATA:  Mid back pain; bacteremia, not moving lower extremity. EXAM: MRI THORACIC SPINE WITHOUT CONTRAST TECHNIQUE: Multiplanar, multisequence MR imaging of the thoracic spine was performed. No intravenous contrast was administered. COMPARISON:  CT chest 10/31/2020. FINDINGS: Mildly motion degraded examination. Alignment: Thoracic levocurvature. No significant spondylolisthesis. Vertebrae: Vertebral body height is maintained. Multilevel degenerative endplate irregularity and small Schmorl nodes. Mild edema along the T6 inferior endplate, at site of a Schmorl node, likely degenerative. No significant marrow edema identified elsewhere within the thoracic spine. Diffuse hypointense marrow signal throughout the thoracic spine, nonspecific but likely related to the patient's end-stage renal disease. Cord: Within the limitations of motion degradation, there is no convincing spinal cord signal abnormality. Paraspinal and other soft tissues: Paraspinal soft tissues within normal limits. Incompletely assessed multifocal airspace disease and cavitary pulmonary lesions.  Small right pleural effusion. 13 mm T2 hyperintense lesion within the hepatic dome, incompletely assessed on this noncontrast examination. Disc levels: No more than mild disc degeneration at any level. No focal disc herniation, spinal canal stenosis or neural foraminal narrowing at any level. IMPRESSION: Mild edema along the T6 inferior endplate, at site of a Schmorl node, likely degenerative. No significant marrow edema elsewhere within the thoracic spine. Mild thoracic spondylosis. No focal disc herniation, spinal canal stenosis or neural foraminal narrowing at any level. Diffuse hypointense marrow signal throughout the thoracic spine, likely related to the patient's end-stage renal disease. Incompletely assessed multifocal airspace disease and cavitary pulmonary lesions. Small right pleural effusion. 13 mm T2 hyperintense lesion within the hepatic dome, incompletely assessed on this non-contrast examination. Electronically Signed   By: Kellie Simmering  DO   On: 11/06/2020 17:05   MR LUMBAR SPINE WO CONTRAST  Result Date: 11/06/2020 CLINICAL DATA:  Back pain, infection suspected; bacteremia, not moving legs. EXAM: MRI LUMBAR SPINE WITHOUT CONTRAST TECHNIQUE: Multiplanar, multisequence MR imaging of the lumbar spine was performed. No intravenous contrast was administered. COMPARISON:  CT abdomen/pelvis 11/01/2020. FINDINGS: Segmentation:  5 lumbar vertebrae. Alignment:  No significant spondylolisthesis. Vertebrae: Vertebral body height is maintained. Multilevel degenerative endplate irregularity with small Schmorl nodes. There is trace multilevel endplate edema, largely adjacent to Schmorl nodes, and findings are likely degenerative in etiology. No suspicious vertebral body marrow edema to suggest discitis/osteomyelitis on the current exam. Mild marrow edema along the L4-L5 and L5-S1 facet joints. Diffuse hypointense marrow signal throughout the lumbar spine nonspecific but likely related to the patient's  end-stage renal disease. Conus medullaris and cauda equina: Conus extends to the L1 level. Conus and cauda equina appear normal. Paraspinal and other soft tissues: Layering hypointense signal within the gallbladder likely reflecting cholecystolithiasis. Nonspecific edema signal within the lumbar paraspinal soft tissues. Disc levels: No more than mild disc degeneration at any level. No significant disc herniation, spinal canal stenosis or neural foraminal narrowing at the T12-L1 through L3-L4 levels. L4-L5: Small disc bulge. Facet arthrosis with ligamentum flavum hypertrophy. Trace bilateral facet joint effusions. No significant spinal canal stenosis or neural foraminal narrowing. L5-S1: Disc bulge. Superimposed broad-based central disc protrusion eccentric to the right at site of posterior annular fissure. Facet arthrosis with mild ligamentum flavum hypertrophy. Mild right subarticular narrowing with the disc protrusion likely contacting the descending right S1 nerve root. Central canal patent. Mild relative left neural foraminal narrowing. IMPRESSION: Trace bilateral L4-L5 facet joint effusions with mild surrounding marrow edema. Findings are nonspecific and could certainly be degenerative given the facet arthrosis present at this level. Facet joint septic arthrosis is difficult to definitively exclude given the provided history of bacteremia. Mild marrow edema along the bilateral L5-S1 facet joints is nonspecific, but favored degenerative given the significant facet arthrosis at this level and lack of facet joint effusions. Trace multilevel endplate edema appears degenerative. Lumbar spondylosis as outlined. No more than mild spinal canal or neural foraminal narrowing at any level. Most notably, and L5-S1 disc protrusion contributes to multifactorial mild right subarticular narrowing and contacts the descending right S1 nerve root. Diffuse hypointense marrow signal likely related to the patient's end-stage renal  disease. Nonspecific edema within the bilateral lumbar paraspinal soft tissues. Suspected cholecystolithiasis. Electronically Signed   By: Kellie Simmering DO   On: 11/06/2020 16:53   CT ABDOMEN PELVIS W CONTRAST  Result Date: 11/07/2020 CLINICAL DATA:  Nausea and vomiting EXAM: CT ABDOMEN AND PELVIS WITH CONTRAST TECHNIQUE: Multidetector CT imaging of the abdomen and pelvis was performed using the standard protocol following bolus administration of intravenous contrast. CONTRAST:  144m OMNIPAQUE IOHEXOL 300 MG/ML  SOLN COMPARISON:  Multiple exams, including 11/01/2020 and abdominal radiograph from 11/07/2020 FINDINGS: Lower chest: Coronary atherosclerosis and mild cardiomegaly. Small right pleural effusion. Continued bibasilar airspace opacities with air bronchograms especially posteriorly in the lower lobes, some of the right middle lobe airspace opacity has resolved. Hepatobiliary: Dependent density in the gallbladder possibly probably from prior contrast excretion. On prior MRI thoracic spine there was a T2 hyperintense lesion in the dome of the right hepatic lobe posteriorly, this is not readily apparent on today's CT scan although this partly may be from streak artifact related to the patient's arm positioning. Pancreas: Mild dorsal pancreatic duct dilatation is observed in the  pancreatic body and head, up to 0.4 cm diameter. No specific cause is identified. Spleen: Unremarkable Adrenals/Urinary Tract: Adrenal glands unremarkable. Right nephrectomy. Atrophic left kidney with multiple cysts, including a rim calcified lesion which is unchanged. Empty urinary bladder. Stomach/Bowel: Nasogastric tube terminates the stomach body and a feeding tube terminates in the stomach antrum. Scattered diverticula of the colon most concentrated in the ascending and sigmoid colon. No dilated bowel. Normal appendix. Vascular/Lymphatic: Aortoiliac atherosclerotic vascular disease. Mild stranding around the right common femoral  artery likely related to recent catheterization common no substantial or masslike hematoma identified. Reproductive: Borderline prostatomegaly. Other: No supplemental non-categorized findings. Musculoskeletal: Degenerative disc disease and degenerative facet arthropathy in the lower lumbar spine. IMPRESSION: 1. A specific cause for the patient's nausea and vomiting is not identified. 2. Continued bibasilar airspace opacities with air bronchograms especially posteriorly in the lower lobes, some of the right middle lobe airspace opacity has resolved. Small right pleural effusion. 3. Mild dorsal pancreatic duct dilatation in the pancreatic body and head, up to 0.4 cm diameter. No specific cause is identified. 4. Atrophic left kidney with multiple cysts, including a rim calcified lesion which is unchanged. 5. The T2 hyperintense lesion in segment 7 of the liver on prior thoracic spine MRI is not well seen on today's CT scan. The region is partially obscured by streak artifact from the patient's arms. 6. Other imaging findings of potential clinical significance: Coronary atherosclerosis and mild cardiomegaly. Borderline prostatomegaly. Degenerative disc disease and degenerative facet arthropathy in the lower lumbar spine. 7. Aortic atherosclerosis. Aortic Atherosclerosis (ICD10-I70.0). Electronically Signed   By: Van Clines M.D.   On: 11/07/2020 11:53   DG CHEST PORT 1 VIEW  Result Date: 11/08/2020 CLINICAL DATA:  Hypoxia EXAM: PORTABLE CHEST 1 VIEW COMPARISON:  November 06, 2020 FINDINGS: Endotracheal tube tip is 5.1 cm above the carina. Nasogastric tube tip and side port are below the diaphragm. Left subclavian catheter tip is in the left innominate vein near the junction with the superior vena cava. Right subclavian catheter tip is in the right atrium slightly beyond the cavoatrial junction. No pneumothorax. There is a small right pleural effusion. There is ill-defined airspace opacity in each lower lung  region, primarily due to atelectasis with potential superimposed bibasilar pneumonia. There is cardiomegaly with pulmonary venous hypertension. No adenopathy. Evidence of old healed right clavicle fracture. There is aortic atherosclerosis. IMPRESSION: Tube and catheter positions as described without pneumothorax. Small right pleural effusion. Ill-defined opacity in the lung bases, slightly increased, likely due to combination of atelectasis and potential bibasilar pneumonia. Stable cardiomegaly with a degree of pulmonary vascular congestion. Aortic Atherosclerosis (ICD10-I70.0). Electronically Signed   By: Lowella Grip III M.D.   On: 11/08/2020 08:15   DG CHEST PORT 1 VIEW  Result Date: 11/06/2020 CLINICAL DATA:  Status post endotracheal intubation EXAM: PORTABLE CHEST 1 VIEW COMPARISON:  Film from earlier in the same day. FINDINGS: Endotracheal tube is again identified and stable. Right jugular temporary dialysis catheter is noted. Feeding catheter is seen extending into the stomach. New left subclavian central line is noted with the catheter tip at the confluence of the innominate veins. No pneumothorax is seen. Patchy persistent atelectasis is noted in the right lung base with associated effusion. IMPRESSION: Tubes and lines as described above. No pneumothorax following central line placement. Stable airspace opacity and effusion in the right base. Electronically Signed   By: Inez Catalina M.D.   On: 11/06/2020 21:03   DG Chest [facility] [address]  Result Date: 11/06/2020 CLINICAL DATA:  Dialysis. EXAM: PORTABLE CHEST 1 VIEW COMPARISON:  November 04, 2020. FINDINGS: Stable cardiomediastinal silhouette. Endotracheal and feeding tubes are unchanged in position. Right internal jugular catheter is unchanged. No pneumothorax is noted. Stable bibasilar atelectasis or infiltrates are noted, with probable small right pleural effusion. Bony thorax is unremarkable. IMPRESSION: Stable support apparatus. Stable  bibasilar atelectasis or infiltrates are noted, with probable small right pleural effusion. Electronically Signed   By: Marijo Conception M.D.   On: 11/06/2020 10:42   US Abdomen Limited RUQ (LIVER/GB)  Result Date: 11/07/2020 CLINICAL DATA:  Nausea vomiting for several hours EXAM: ULTRASOUND ABDOMEN LIMITED RIGHT UPPER QUADRANT COMPARISON:  CT from earlier in the same day. FINDINGS: Gallbladder: Gallbladder is well distended with evidence of gallbladder sludge and multiple gallbladder polyps. No stones are seen. No wall thickening or pericholecystic fluid is noted. Common bile duct: Diameter: 6.6 mm. Liver: The hepatic echogenicity is within normal limits with the exception of a focal area of increased echogenicity measuring 2.3 cm in greatest dimension. This is consistent with a small hemangioma. This is also stable from recent MRI examination. Portal vein is patent on color Doppler imaging with normal direction of blood flow towards the liver. Other: None. IMPRESSION: Gallbladder sludge and gallbladder polyps without complicating factors. Changes consistent with hepatic hemangioma. Electronically Signed   By: Inez Catalina M.D.   On: 11/07/2020 19:47   Medications: . sodium chloride Stopped (11/03/20 2145)  . sodium chloride Stopped (10/28/20 0954)  . sodium chloride Stopped (11/04/20 0016)  . amiodarone 30 mg/hr (11/08/20 0700)  . cangrelor 50 mg in NS 250 mL 0.75 mcg/kg/min (11/08/20 0700)  .  ceFAZolin (ANCEF) IV    . feeding supplement (VITAL AF 1.2 CAL) Stopped (11/07/20 0700)  . heparin 1,850 Units/hr (11/08/20 0700)  . HYDROmorphone 2.5 mg/hr (11/08/20 0816)  . norepinephrine (LEVOPHED) Adult infusion 16 mcg/min (11/08/20 0700)  . propofol (DIPRIVAN) infusion Stopped (11/05/20 0908)  . vasopressin 0.03 Units/min (11/08/20 0700)   . sodium chloride   Intravenous Once  . aspirin  81 mg Per Tube Daily  . atorvastatin  80 mg Per Tube q1800  . B-complex with vitamin C  1 tablet Per Tube  Daily  . bisacodyl  10 mg Rectal BID  . chlorhexidine gluconate (MEDLINE KIT)  15 mL Mouth Rinse BID  . Chlorhexidine Gluconate Cloth  6 each Topical Q0600  . clonazepam  1 mg Per Tube BID  . docusate  100 mg Per Tube BID  . doxercalciferol  5 mcg Intravenous Q M,W,F  . feeding supplement (PROSource TF)  45 mL Per Tube QID  . insulin aspart  0-6 Units Subcutaneous Q4H  . lidocaine  1 patch Transdermal Q24H  . mouth rinse  15 mL Mouth Rinse 10 times per day  . metoCLOPramide (REGLAN) injection  10 mg Intravenous Q12H  . pantoprazole (PROTONIX) IV  40 mg Intravenous Q12H  . polyethylene glycol  17 g Per Tube Daily  . QUEtiapine  100 mg Per Tube BID  . senna  1 tablet Per Tube QHS  . sevelamer carbonate  0.8 g Per Tube TID  . sodium chloride flush  10-40 mL Intracatheter Q12H  . sodium chloride flush  3 mL Intravenous Q12H  . Thrombi-Pad  1 each Topical Once     OP HD:AF MWF 4h 450/800 81.5kg 2/2.25 bath RUE AVF  ---Hep 5000 units IV initial bolus + 1000 units IVmidrun - hect 5 ug tiw  -  mircera 50 ug IV q 4 weeks, last 11/15 (due 11/29)    Assessment/ Plan: 1. Acute STEMI- SP LAD PCI11/20 am, has otherdisease inLCx/ RCA.Transitioned to brilinta from cangrelor. Per cardiology. 2. MSSA cavitary PNA / bacteremia-intubated 11/23. Per PCCM. Respiratory culture 11/23 with MSSA. Blood cx +11/21.CT abd/ pelvis negative 11/23.F/U bcx's negative.Dialysis duplex 11/22 without abscessandUS of AVF wasnegative 11/27. Plan for 6 weeks of IV cefazolin per pharmacy.  3. PEA arrest: occurred in setting of agitation/ sedation.Brief CPR. 4. Vomited Emesis that looks and smells like stool. OG in place. 1.5 liters emesis removed.CT Ab/Pelvis with contrast 11/30. Unrevealing. Per primary. 5. ESRD - usual HD MWF. ^^LVEDP per initial LHC.Started CRRT 11/24. Now is 10kg down from admit wt and 7 kg under dry wt. Vol excess resolved on exam/ CXR. CRRT DC'd 10/27/2020 BUN  elevated 112, SCr 7.43 K+ 5.5. Will have IHD today. No heparin  6. Volume-appears euvolemic by exam. I & O+[id number]. UF as tolerated today.  7. Shock:on 2 pressors, per CCM. BP improved today on levo/vasopressin.  8. Anemia ckd - Hb 7.1 11/30. Transfused 1 unit PRBCS. Blooy tinged NG drainage. HGB 7.1 again today. Transfuse 1 unit PRBCS today.  9. MBD ckd - Orally intubated, binders on hold. 10. Afib:on amio and hep gtt, s/p DCCV.  10. Severe MR:per cardiology.    Ysabel Stankovich H. Celeste Candelas NP-C 11/08/2020, 8:22 AM  [employer] [phone]

## 2020-11-08 NOTE — Progress Notes (Signed)
NAME:  Travis [name], MRN:  [id number], DOB:  Dec 16, 1960, LOS: 50 ADMISSION DATE:  10/28/2020, CONSULTATION DATE:  10/29/20 REFERRING MD:  Cardiology - [name], CHIEF COMPLAINT:  Hypotension, gram positive cocci on culture, AMS  Brief History   Travis [name] is a 59 year old man with a history of HTN, ESRD on HD MWF, RUE AV fistula, hx of smoking, admitted 10/28/20 with fevers, myalgias for several days, acute chest pain.    History of present illness   On Admission, found to have STEMI, multivessel disease. Pulm edema with mild hypoxemia.  S/Post PCI to the LAD on 10/29/19. Planning for staged intervention to the left circumflex and possibly right coronary artery.  EF 51-02%, diastolic dysfunction, LVEDP 35. Underwent HD on 11/20 with 4 L volume removed.  Saturation improved after dialysis (100% on RA) Fevers noted 11/20. Blood cultures grew MSSA, noted this evening. .    This evening developed Afib with RVR 180s. Adenosine given, without improvement  Amiodarone infusion started per cardiology, 150mg  bolus, then drip.  Lopressor 5mg IV and 500ccNS given. Started on Phenylephrine, was on 278mcg on my arrival.   Cardioverted emergently at 120J for ongoing afib with hypotension (60/40) and AMS.  Converted to sinus.  Remained moderately hypotensive (MAP 60) on phenylephrine.   Patient was given one dose zosyn, switched to ancef once cultures grew back MSSA.   Cardiac stress tests annually, last done 11/16/19.    Past Medical History  HTN ESRD, HD MWF  Significant Hospital Events   Cardioversion 11/21 Cardiac cath stent to LAD 11/20  Consults:  PCCM  Nephrology ID  Procedures:  Central line 11/21 >> 11/29 Arterial line 11/21 Repeat DCCV 11/23 11/23 ETT HD line 11/24 Arterial line 11/24 L [place] CVC 11/30>>   Significant Diagnostic Tests:  11/24 TEE>>Left ventricular ejection fraction, by estimation, is 35 to 40%. The  left ventricle has moderately decreased function.Moderate to  severe mitral regurgitation. 11/23 CT Chest>>Extensive multifocal nodular and patchy airspace disease in both lungs with a slight peripheral predominance in the upper lungs. 3.1 cm nodular consolidative opacity in the right upper lobe is cavitated. Imaging features likely related to multifocal pneumonia. Septic emboli and metastatic disease considered less likely but not excluded. 11/22 LUE Vas Upper Extremity Doppler>> Arteriovenous fistula-Aneurysmal dilatation noted. 11/24 CT abdomen - no retroperitoneal hematoma 11/29 MR Brain>> No evidence acute intracranial abnormality, sinisitis, mastoid effusions 11/29>> MR Cervical Spine>> Given the provided history of bacteremia, facet joint septic arthritis is difficult to definitively exclude, but the lack of any surrounding marrow edema argues against this Cervical spondylosis  11/29 MR Thoracic Spine No significant marrow edema in thoracic spine , Mild thoracic spondylosis hypointense marrow signal throughout the thoracic spine, likely related to the patient's end-stage renal disease. multifocal airspace disease and cavitary pulmonary lesions. Small right pleural effusion. 11/29 MR Lumbar Spine:As noted above and Suspected cholecystolithiasis  Micro Data:  11/21 BCx2>>Staph aureus > MSSA by BCID 11/24 BCx2>> 11/30 BC x 2>>   Antimicrobials:  Zosyn 11/21 x 1 Ancef 11/21 ->  Interim history/subjective:  Pt with decreased output from NG, CT abd/pelvis and RUQ [name] without obstruction or evidence of cholecystitis.  No BM in >10 days despite multiple therapies and suppository  Objective   Blood pressure (!) 185/70, pulse 96, temperature 98.4 F (36.9 C), temperature source Axillary, resp. rate (!) 28, height 5\' 11"  (1.803 m), weight 80.6 kg, SpO2 99 %.    Vent Mode: PSV;CPAP FiO2 (%):  [40 %] 40 %  Set Rate:  [16 bmp] 16 bmp Vt Set:  [600 mL] 600 mL PEEP:  [5 cmH20] 5 cmH20 Pressure Support:  [10 cmH20] 10 cmH20 Plateau Pressure:  [15  cmH20-16 cmH20] 16 cmH20   Intake/Output Summary (Last 24 hours) at 11/08/2020 0856 Last data filed at 11/08/2020 0700 Gross per 24 hour  Intake 4037.41 ml  Output --  Net 4037.41 ml   Filed Weights   11/05/20 0500 11/06/20 0500 11/08/20 0500  Weight: 76 kg 75.8 kg 80.6 kg   General:  Critically ill male M, intubated and sedated HEENT: MM pink/moist, ETT in place, minimal secretions Neuro: examined on Dilaudid, opens eyes to voice and moving hands to commands CV: s1s2 rrr, no m/r/g, PULM:  Mechanical breath sounds throughout, , on full vent support, no wheezing or rhonchi, diminished per bases, tolerating PS 10/5 with good tidal volumes GI: Very Tender to touch, slightly distended, decreased OG output today, minimal BS Extremities: warm/dry, trace edema  Skin: no rashes or lesions   Resolved Hospital Problem list     Assessment & Plan:   Anterior STEMI, CAD, acute CHF exacerbation.  S/P PCI-DES pf LAD Cardiology primary, on Heparin, brillinta, asa.   P: -multi-vessel disease that will  eventually need a return to cath lab for staged PCI  -long term plan Warfarin/Plavix>> will need to consider IV antiplatelet if PO dosing has to stop secondary to ileus -Volume overload managed with CRRT, transitioning from CRRT to iHD   Afib now cardioverted to sinus rhythm on amiodarone following DCCV P: -Continue IV amiodarone -Monitor, weaning  Levophed in favor of Vasopressin, maintain MAP >65    Severe sepsis with septic shock due to MSSA bacteremia requiring titration of vasopressors, bacteremia likely from HD access.  No evidence of fistula or abscess.    Cavitations on CT chest, concern for septic emboli.  No clear vegetation on TEE P: -Continue Ancef, will need 6 weeks IV, repeat BC without growth thus far -WBC improving  -ID following, continue renally dosed Cefazolin -1/2 cultures with staph epi, likely contaminant -Repeat blood cultures in AM -maintain pressors to keep MAP  greater than 65 Follow Blood Cx drawn 11/30  Acute hypoxic respiratory failure due to MSSA pneumonia with cavitation. SBT tolerated well today, some agitation still precludes extubation P: -continue Ancef  -Chest physiotherapy -Have initiated SBT's.  - Mental status precludes extubation at this time --Maintain full vent support with SAT/SBT as tolerated -titrate Vent setting to maintain SpO2 greater than or equal to 90%. -HOB elevated 30 degrees. -Plateau pressures less than 30 cm H20.  -Follow chest x-ray, ABG prn.   -Bronchial hygiene and RT/bronchodilator protocol.   Constipation and Ileus Abdominal work-up negative for obstruction yesterday, GB with sludge but no cholecystitis  P: -SMOG enema with >1L stool out  -continue stool softener from above and below   Acute toxic metabolic encephalopathy with agitation requiring titration of IV sedatives.  Improving, still requiring Dilaudid and prn Benzos  P: -MRI brain results noted above -Continue as needed lorazepam - Dilaudid at 1 mg - prn's as needed for pain management    Acute Normocytic Anemia With dark fluid and reported coffee ground material 11/28 suctioned from OG and NG tube HGB stable 11/30 P: - Trend CBC , down from 9.5 on admission transfuse 1 unit PRBC's - Start protonix 40mg  bid - monitor h/h and for signs of UGIB, may need GI consult - Heparin continued for now, monitor and may need to stop Brilinta and  Asa    Hyponatremia - nephrology following, last HD 11/22 - Trend BMET  ESRD:  -Continue CRRT, CRRT stopped 11/29 -Nephrology following - Trend BMET   Best practice:  Diet: Continue tube feeding Pain/Anxiety/Delirium protocol (if indicated): hydromorphone infusion to RASS -1, have added lorazepam IV as needed to minimize propofol use.  On enteral Seroquel VAP protocol (if indicated): Bundle in place DVT prophylaxis: on heparin gtt GI prophylaxis: protonix Glucose control: Phase 1 glycemic  control Mobility: Bedrest Code Status: Full  Family Communication: Spoke with wife  at bedside 12/1 Disposition: ICU  Labs   CBC: Recent Labs  Lab 11/04/20 0429 11/04/20 0429 11/05/20 0246 11/05/20 0246 11/06/20 0205 11/06/20 0205 11/06/20 1041 11/07/20 0242 11/07/20 0540 11/07/20 0631 11/08/20 0347  WBC 30.5*  --  40.3*  --  38.5*  --   --  28.4*  --   --  19.8*  HGB 7.4*   < > 8.1*   < > 7.0*   < > 6.9* 7.1* 7.8* 7.8* 7.1*  HCT 22.5*   < > 25.5*   < > 22.2*   < > 22.4* 23.2* 23.0* 23.0* 22.8*  MCV 99.1  --  98.8  --  100.0  --   --  102.2*  --   --  99.6  PLT 304  --  377  --  290  --   --  246  --   --  192   < > = values in this interval not displayed.    Basic Metabolic Panel: Recent Labs  Lab 11/04/20 1520 11/04/20 1520 11/05/20 0246 11/05/20 1002 11/05/20 1736 11/05/20 1736 11/06/20 0205 11/06/20 0205 11/07/20 0241 11/07/20 0242 11/07/20 0540 11/07/20 0631 11/07/20 1132 11/08/20 0347  NA 134*   < > 134*   < > 135   < > 135   < > 134*  --  134* 134* 133* 134*  K 3.6   < > 3.2*   < > 4.1   < > 4.3   < > 4.7  --  5.0 5.2* 5.1 5.5*  CL 95*   < > 93*   < > 98  --  98  --  98  --   --   --  98 101  CO2 26   < > 25   < > 24  --  22  --  21*  --   --   --  19* 16*  GLUCOSE 139*   < > 146*   < > 133*  --  162*  --  144*  --   --   --  97 89  BUN 34*   < > 42*   < > 50*  --  52*  --  80*  --   --   --  96* 112*  CREATININE 2.92*   < > 2.84*   < > 2.84*  --  2.87*  --  4.81*  --   --   --  5.78* 7.43*  CALCIUM 9.4   < > 9.2   < > 9.0  --  9.1  --  9.4  --   --   --  9.2 9.4  MG 2.8*  --  2.8*  --   --   --  2.7*  --   --  2.8*  --   --   --  2.7*  PHOS 3.1   < > 2.7  2.7  --  2.9  --  3.8  --  5.2*  --   --   --   --  7.8*   < > = values in this interval not displayed.   GFR: Estimated Creatinine Clearance: 11.5 mL/min (A) (by C-G formula based on SCr of 7.43 mg/dL (H)). Recent Labs  Lab 11/05/20 0246 11/06/20 0205 11/07/20 0242 11/08/20 0347  11/08/20 0348  WBC 40.3* 38.5* 28.4* 19.8*  --   LATICACIDVEN  --   --   --   --  0.5    Liver Function Tests: Recent Labs  Lab 11/05/20 0246 11/05/20 1736 11/06/20 0205 11/07/20 0241 11/08/20 0347  AST  --   --   --   --  53*  ALT  --   --   --   --  PENDING  ALKPHOS  --   --   --   --  137*  BILITOT  --   --   --   --  1.9*  PROT  --   --   --   --  5.8*  ALBUMIN 1.6* 1.6* 1.5* 1.4* 1.3*   No results for input(s): LIPASE, AMYLASE in the last 168 hours. No results for input(s): AMMONIA in the last 168 hours.  ABG    Component Value Date/Time   PHART 7.410 11/07/2020 0631   PCO2ART 35.2 11/07/2020 0631   PO2ART 58 (L) 11/07/2020 0631   HCO3 22.3 11/07/2020 0631   TCO2 23 11/07/2020 0631   ACIDBASEDEF 2.0 11/07/2020 0631   O2SAT 90.0 11/07/2020 0631     Coagulation Profile: No results for input(s): INR, PROTIME in the last 168 hours.  Cardiac Enzymes: No results for input(s): CKTOTAL, CKMB, CKMBINDEX, TROPONINI in the last 168 hours.  HbA1C: Hgb A1c MFr Bld  Date/Time Value Ref Range Status  10/28/2020 06:04 AM 4.3 (L) 4.8 - 5.6 % Final    Comment:    (NOTE) Pre diabetes:          5.7%-6.4%  Diabetes:              >6.4%  Glycemic control for   <7.0% adults with diabetes   01/30/2015 09:09 PM 5.1 4.8 - 5.6 % Final    Comment:    (NOTE)         Pre-diabetes: 5.7 - 6.4         Diabetes: >6.4         Glycemic control for adults with diabetes: <7.0     CBG: Recent Labs  Lab 11/07/20 1611 11/07/20 2056 11/08/20 0017 11/08/20 0340 11/08/20 0804  GLUCAP 88 88 85 76 74    Critical care time: 35 minutes    CRITICAL CARE Performed by: Otilio Carpen Elis Rawlinson NP   Total critical care time: 30 minutes  Critical care time was exclusive of separately billable procedures and treating other patients.  Critical care was necessary to treat or prevent imminent or life-threatening deterioration.  Critical care was time spent personally by me on the following  activities: development of treatment plan with patient and/or surrogate as well as nursing, discussions with consultants, evaluation of patient's response to treatment, examination of patient, obtaining history from patient or surrogate, ordering and performing treatments and interventions, ordering and review of laboratory studies, ordering and review of radiographic studies, pulse oximetry and re-evaluation of patient's condition.   Otilio Carpen Teryl Gubler, PA-C [facility]  Pager# [phone], if no answer [phone]

## 2020-11-08 NOTE — Progress Notes (Signed)
Progress Note  Patient Name: Travis [name] Date of Encounter: 11/08/2020  [facility] Cardiologist: Glenetta Hew, MD   Subjective   Maintaining sinus rhythm with IV amiodarone.  However, he continues to be hypertensive requiring norepinephrine drip.  Hypotension worsened with dialysis with plans to switch to CRRT.  The patient was switched to IV Cangrelor due to concerns about an ileus interfering with absorption of antiplatelet medications.  The patient is getting transfusion given drop in hemoglobin to 7.1.  Inpatient Medications    Scheduled Meds: . sodium chloride   Intravenous Once  . aspirin  81 mg Per Tube Daily  . atorvastatin  80 mg Per Tube q1800  . B-complex with vitamin C  1 tablet Per Tube Daily  . bisacodyl  10 mg Rectal BID  . chlorhexidine gluconate (MEDLINE KIT)  15 mL Mouth Rinse BID  . Chlorhexidine Gluconate Cloth  6 each Topical Q0600  . clonazepam  1 mg Per Tube BID  . docusate  100 mg Per Tube BID  . doxercalciferol  5 mcg Intravenous Q M,W,F  . feeding supplement (PROSource TF)  45 mL Per Tube QID  . insulin aspart  0-6 Units Subcutaneous Q4H  . lidocaine  1 patch Transdermal Q24H  . mouth rinse  15 mL Mouth Rinse 10 times per day  . metoCLOPramide (REGLAN) injection  10 mg Intravenous Q12H  . pantoprazole (PROTONIX) IV  40 mg Intravenous Q12H  . polyethylene glycol  17 g Per Tube Daily  . QUEtiapine  100 mg Per Tube BID  . senna  1 tablet Per Tube QHS  . sevelamer carbonate  0.8 g Per Tube TID  . sodium chloride flush  10-40 mL Intracatheter Q12H  . sodium chloride flush  3 mL Intravenous Q12H  . Thrombi-Pad  1 each Topical Once   Continuous Infusions: .  prismasol BGK 4/2.5    .  prismasol BGK 4/2.5    . sodium chloride Stopped (11/03/20 2145)  . sodium chloride Stopped (10/28/20 0954)  . sodium chloride Stopped (11/04/20 0016)  . sodium chloride    . sodium chloride    . amiodarone 30 mg/hr (11/08/20 1500)  . cangrelor 50 mg in NS  250 mL 0.75 mcg/kg/min (11/08/20 1545)  .  ceFAZolin (ANCEF) IV    . epinephrine Stopped (11/08/20 1343)  . feeding supplement (VITAL AF 1.2 CAL) Stopped (11/07/20 0700)  . heparin 1,750 Units/hr (11/08/20 1541)  . HYDROmorphone 2 mg/hr (11/08/20 1500)  . norepinephrine (LEVOPHED) Adult infusion 20 mcg/min (11/08/20 1543)  . prismasol BGK 4/2.5    . propofol (DIPRIVAN) infusion 5 mcg/kg/min (11/08/20 1317)  . vasopressin 0.03 Units/min (11/08/20 1500)   PRN Meds: sodium chloride, sodium chloride, sodium chloride, acetaminophen, alteplase, bisacodyl, heparin, HYDROmorphone, HYDROmorphone (DILAUDID) injection, HYDROmorphone (DILAUDID) injection, lidocaine (PF), lidocaine-prilocaine, LORazepam, midazolam, nitroGLYCERIN, ondansetron (ZOFRAN) IV, pentafluoroprop-tetrafluoroeth, sodium chloride, sodium chloride flush, sodium chloride flush   Vital Signs    Vitals:   11/08/20 1500 11/08/20 1522 11/08/20 1523 11/08/20 1530  BP: 119/72 109/88  (!) 103/57  Pulse: 95 97  95  Resp: _0 Temp:      TempSrc:      SpO2: 99% 100% 100% 100%  Weight:      Height:        Intake/Output Summary (Last 24 hours) at 11/08/2020 1551 Last data filed at 11/08/2020 1500 Gross per 24 hour  Intake 4499.83 ml  Output 127 ml  Net 4372.83 ml  Last 3 Weights 11/08/2020 11/08/2020 11/08/2020  Weight (lbs) 177 lb 7.5 oz 177 lb 11.1 oz 177 lb 11.1 oz  Weight (kg) 80.5 kg 80.6 kg 80.6 kg      Telemetry    NSR - Personally Reviewed  ECG      Physical Exam   GEN: Intubated , sedated Neck: No JVD Cardiac: RRR, no murmurs, rubs, or gallops.  Respiratory: Coarse breath sounds to auscultation bilaterally. GI: Tender MS: No edema; No deformity. Neuro:  Intubated, sedated Psych: Intubated, sedated  Labs    High Sensitivity Troponin:   Recent Labs  Lab 10/28/20 0629 10/28/20 1046 10/28/20 2040 10/29/20 2121 10/30/20 0548  TROPONINIHS 3,155* >27,000* >27,000* >27,000* >27,000*       Chemistry Recent Labs  Lab 11/06/20 0205 11/06/20 0205 11/07/20 0241 11/07/20 0540 11/07/20 0631 11/07/20 1132 11/08/20 0347  NA 135   < > 134*   < > 134* 133* 134*  K 4.3   < > 4.7   < > 5.2* 5.1 5.5*  CL 98   < > 98  --   --  98 101  CO2 22   < > 21*  --   --  19* 16*  GLUCOSE 162*   < > 144*  --   --  97 89  BUN 52*   < > 80*  --   --  96* 112*  CREATININE 2.87*   < > 4.81*  --   --  5.78* 7.43*  CALCIUM 9.1   < > 9.4  --   --  9.2 9.4  PROT  --   --   --   --   --   --  5.8*  ALBUMIN 1.5*  --  1.4*  --   --   --  1.3*  AST  --   --   --   --   --   --  53*  ALT  --   --   --   --   --   --  5  ALKPHOS  --   --   --   --   --   --  137*  BILITOT  --   --   --   --   --   --  1.9*  GFRNONAA 25*   < > 13*  --   --  11* 8*  ANIONGAP 15   < > 15  --   --  16* 17*   < > = values in this interval not displayed.     Hematology Recent Labs  Lab 11/06/20 0205 11/06/20 1041 11/07/20 0242 11/07/20 0540 11/07/20 0631 11/08/20 0347 11/08/20 1406  WBC 38.5*  --  28.4*  --   --  19.8*  --   RBC 2.22*  --  2.27*  --   --  2.29*  --   HGB 7.0*   < > 7.1*   < > 7.8* 7.1* 8.6*  HCT 22.2*   < > 23.2*   < > 23.0* 22.8* 26.5*  MCV 100.0  --  102.2*  --   --  99.6  --   MCH 31.5  --  31.3  --   --  31.0  --   MCHC 31.5  --  30.6  --   --  31.1  --   RDW 21.2*  --  21.4*  --   --  21.3*  --   PLT 290  --  246  --   --  192  --    < > = values in this interval not displayed.    BNPNo results for input(s): BNP, PROBNP in the last 168 hours.   DDimer No results for input(s): DDIMER in the last 168 hours.   Radiology    DG Abd 1 View  Result Date: 11/07/2020 CLINICAL DATA:  Orogastric tube placement EXAM: ABDOMEN - 1 VIEW COMPARISON:  None. FINDINGS: Orogastric tube tip is seen within the proximal body of the stomach with its proximal side hole seen at the level of gastroesophageal junction. Nasoenteric feeding tube tip noted within the distal stomach in the region of the  pylorus. Normal abdominal gas pattern. No gross free intraperitoneal gas. Right basilar atelectasis or infiltrate. IMPRESSION: Orogastric tube tip just within the gastric lumen. Advancement by 5-10 cm may better position the tube. Nasoenteric feeding tube tip in the region of the pylorus. Advancement of the tube by 10-15 cm should allow eventual passage into the proximal duodenum. Electronically Signed   By: Fidela Salisbury MD   On: 11/07/2020 06:17   MR BRAIN WO CONTRAST  Result Date: 11/06/2020 CLINICAL DATA:  History media, concern for septic emboli. EXAM: MRI HEAD WITHOUT CONTRAST TECHNIQUE: Multiplanar, multiecho pulse sequences of the brain and surrounding structures were obtained without intravenous contrast. COMPARISON:  Brain MRI/MRA 11/13/2012. FINDINGS: Brain: Cerebral volume is normal. Minimal multifocal T2/FLAIR hyperintensity within the cerebral white matter is nonspecific, but compatible with chronic small vessel ischemic disease. There is no acute infarct. No evidence of intracranial mass. No chronic intracranial blood products. No extra-axial fluid collection. No midline shift. Vascular: Expected proximal arterial flow voids. Skull and upper cervical spine: No focal suspicious marrow lesion. Sinuses/Orbits: Visualized orbits show no acute finding. Mild ethmoid and left sphenoid sinus mucosal thickening. Small volume frothy secretions are also present within the left sphenoid sinus. Other: Bilateral mastoid effusions. Partially visualized support tubes. Secretions within the nasopharynx and oropharynx. IMPRESSION: No non-contrast evidence of acute intracranial abnormality. Mild cerebral white matter chronic small vessel ischemic disease. Ethmoid and left sphenoid sinusitis. Bilateral mastoid effusions. Electronically Signed   By: Kellie Simmering DO   On: 11/06/2020 16:23   MR CERVICAL SPINE WO CONTRAST  Result Date: 11/06/2020 CLINICAL DATA:  Neck pain, acute, infection suspected; bacteremia,  not moving lower extremities. EXAM: MRI CERVICAL SPINE WITHOUT CONTRAST TECHNIQUE: Multiplanar, multisequence MR imaging of the cervical spine was performed. No intravenous contrast was administered. COMPARISON:  No pertinent prior exams available for comparison. FINDINGS: Alignment: No significant spondylolisthesis. Vertebrae: Diffuse abnormal hypointense marrow signal throughout the cervical and visualized upper thoracic spine, nonspecific but likely related to the patient's known end-stage renal disease. Trace edema along the C6 inferior endplate is nonspecific, but likely degenerative. No significant marrow edema is identified elsewhere within the cervical spine. Cord: No spinal cord signal abnormality Posterior Fossa, vertebral arteries, paraspinal tissues: Posterior fossa better assessed on the concurrently performed brain MRI. Flow voids preserved within the imaged cervical vertebral arteries. Partially imaged life support tubes. Prominent secretions within the nasopharynx and oropharynx. Paraspinal soft tissues unremarkable. Disc levels: Mild multilevel disc degeneration greatest at C6-C7. C2-C3: Disc osteophyte ridge/uncinate hypertrophy on the right. Minimal partial effacement of the ventral thecal sac on the right without significant central canal stenosis. Mild right neural foraminal narrowing. C3-C4: Mild uncinate and facet hypertrophy on the right. No significant disc herniation or spinal canal stenosis. Mild right neural foraminal narrowing. Trace left facet joint effusion (series 8, image 16). C4-C5: Uncinate hypertrophy (greater on  the right). Minimal facet hypertrophy. No significant disc herniation or spinal canal stenosis. Moderate/severe neural foraminal narrowing. Small left facet joint effusion (series 8, image 21). C5-C6: Left center disc protrusion (series 9, image 26). Uncinate hypertrophy (greater on the right). Minimal facet hypertrophy. The disc protrusion contributes to mild relative  spinal canal narrowing. Moderate right neural foraminal narrowing. Trace left facet joint effusion (series 8, image 25) C6-C7: Shallow broad-based left center disc protrusion. Mild uncovertebral and facet hypertrophy. No significant spinal canal stenosis. Bilateral neural foraminal narrowing (moderate right, mild left). Small left facet joint effusion (series 8, image 30). C7-T1: No significant disc herniation or stenosis. Trace left facet joint effusion. IMPRESSION: Trace or small facet joint effusions on the left at C3-C4, C4-C5, C5-C6, C6-C7 and C7-T1 are nonspecific and may be degenerative in etiology. Given the provided history of bacteremia, facet joint septic arthritis is difficult to definitively exclude, but the lack of any surrounding marrow edema argues against this. Minimal edema along the C6 inferior endplate is likely degenerative. Cervical spondylosis as outline. At C5-C6, a left center disc protrusion contributes to mild relative spinal canal narrowing. No significant canal stenosis at the remaining levels. Multilevel neural foraminal narrowing greatest on the right at C4-C5 (moderate/severe), on the right at C5-C6 (moderate) and on the right at C6-C7 (moderate). Diffuse abnormal T1 hypointense marrow signal, nonspecific but likely related to the patient's end-stage renal disease. Electronically Signed   By: Kellie Simmering DO   On: 11/06/2020 16:39   MR THORACIC SPINE WO CONTRAST  Result Date: 11/06/2020 CLINICAL DATA:  Mid back pain; bacteremia, not moving lower extremity. EXAM: MRI THORACIC SPINE WITHOUT CONTRAST TECHNIQUE: Multiplanar, multisequence MR imaging of the thoracic spine was performed. No intravenous contrast was administered. COMPARISON:  CT chest 10/31/2020. FINDINGS: Mildly motion degraded examination. Alignment: Thoracic levocurvature. No significant spondylolisthesis. Vertebrae: Vertebral body height is maintained. Multilevel degenerative endplate irregularity and small Schmorl  nodes. Mild edema along the T6 inferior endplate, at site of a Schmorl node, likely degenerative. No significant marrow edema identified elsewhere within the thoracic spine. Diffuse hypointense marrow signal throughout the thoracic spine, nonspecific but likely related to the patient's end-stage renal disease. Cord: Within the limitations of motion degradation, there is no convincing spinal cord signal abnormality. Paraspinal and other soft tissues: Paraspinal soft tissues within normal limits. Incompletely assessed multifocal airspace disease and cavitary pulmonary lesions. Small right pleural effusion. 13 mm T2 hyperintense lesion within the hepatic dome, incompletely assessed on this noncontrast examination. Disc levels: No more than mild disc degeneration at any level. No focal disc herniation, spinal canal stenosis or neural foraminal narrowing at any level. IMPRESSION: Mild edema along the T6 inferior endplate, at site of a Schmorl node, likely degenerative. No significant marrow edema elsewhere within the thoracic spine. Mild thoracic spondylosis. No focal disc herniation, spinal canal stenosis or neural foraminal narrowing at any level. Diffuse hypointense marrow signal throughout the thoracic spine, likely related to the patient's end-stage renal disease. Incompletely assessed multifocal airspace disease and cavitary pulmonary lesions. Small right pleural effusion. 13 mm T2 hyperintense lesion within the hepatic dome, incompletely assessed on this non-contrast examination. Electronically Signed   By: Kellie Simmering DO   On: 11/06/2020 17:05   MR LUMBAR SPINE WO CONTRAST  Result Date: 11/06/2020 CLINICAL DATA:  Back pain, infection suspected; bacteremia, not moving legs. EXAM: MRI LUMBAR SPINE WITHOUT CONTRAST TECHNIQUE: Multiplanar, multisequence MR imaging of the lumbar spine was performed. No intravenous contrast was administered. COMPARISON:  CT  abdomen/pelvis 11/01/2020. FINDINGS: Segmentation:  5  lumbar vertebrae. Alignment:  No significant spondylolisthesis. Vertebrae: Vertebral body height is maintained. Multilevel degenerative endplate irregularity with small Schmorl nodes. There is trace multilevel endplate edema, largely adjacent to Schmorl nodes, and findings are likely degenerative in etiology. No suspicious vertebral body marrow edema to suggest discitis/osteomyelitis on the current exam. Mild marrow edema along the L4-L5 and L5-S1 facet joints. Diffuse hypointense marrow signal throughout the lumbar spine nonspecific but likely related to the patient's end-stage renal disease. Conus medullaris and cauda equina: Conus extends to the L1 level. Conus and cauda equina appear normal. Paraspinal and other soft tissues: Layering hypointense signal within the gallbladder likely reflecting cholecystolithiasis. Nonspecific edema signal within the lumbar paraspinal soft tissues. Disc levels: No more than mild disc degeneration at any level. No significant disc herniation, spinal canal stenosis or neural foraminal narrowing at the T12-L1 through L3-L4 levels. L4-L5: Small disc bulge. Facet arthrosis with ligamentum flavum hypertrophy. Trace bilateral facet joint effusions. No significant spinal canal stenosis or neural foraminal narrowing. L5-S1: Disc bulge. Superimposed broad-based central disc protrusion eccentric to the right at site of posterior annular fissure. Facet arthrosis with mild ligamentum flavum hypertrophy. Mild right subarticular narrowing with the disc protrusion likely contacting the descending right S1 nerve root. Central canal patent. Mild relative left neural foraminal narrowing. IMPRESSION: Trace bilateral L4-L5 facet joint effusions with mild surrounding marrow edema. Findings are nonspecific and could certainly be degenerative given the facet arthrosis present at this level. Facet joint septic arthrosis is difficult to definitively exclude given the provided history of bacteremia. Mild  marrow edema along the bilateral L5-S1 facet joints is nonspecific, but favored degenerative given the significant facet arthrosis at this level and lack of facet joint effusions. Trace multilevel endplate edema appears degenerative. Lumbar spondylosis as outlined. No more than mild spinal canal or neural foraminal narrowing at any level. Most notably, and L5-S1 disc protrusion contributes to multifactorial mild right subarticular narrowing and contacts the descending right S1 nerve root. Diffuse hypointense marrow signal likely related to the patient's end-stage renal disease. Nonspecific edema within the bilateral lumbar paraspinal soft tissues. Suspected cholecystolithiasis. Electronically Signed   By: Kellie Simmering DO   On: 11/06/2020 16:53   CT ABDOMEN PELVIS W CONTRAST  Result Date: 11/07/2020 CLINICAL DATA:  Nausea and vomiting EXAM: CT ABDOMEN AND PELVIS WITH CONTRAST TECHNIQUE: Multidetector CT imaging of the abdomen and pelvis was performed using the standard protocol following bolus administration of intravenous contrast. CONTRAST:  121m OMNIPAQUE IOHEXOL 300 MG/ML  SOLN COMPARISON:  Multiple exams, including 11/01/2020 and abdominal radiograph from 11/07/2020 FINDINGS: Lower chest: Coronary atherosclerosis and mild cardiomegaly. Small right pleural effusion. Continued bibasilar airspace opacities with air bronchograms especially posteriorly in the lower lobes, some of the right middle lobe airspace opacity has resolved. Hepatobiliary: Dependent density in the gallbladder possibly probably from prior contrast excretion. On prior MRI thoracic spine there was a T2 hyperintense lesion in the dome of the right hepatic lobe posteriorly, this is not readily apparent on today's CT scan although this partly may be from streak artifact related to the patient's arm positioning. Pancreas: Mild dorsal pancreatic duct dilatation is observed in the pancreatic body and head, up to 0.4 cm diameter. No specific cause  is identified. Spleen: Unremarkable Adrenals/Urinary Tract: Adrenal glands unremarkable. Right nephrectomy. Atrophic left kidney with multiple cysts, including a rim calcified lesion which is unchanged. Empty urinary bladder. Stomach/Bowel: Nasogastric tube terminates the stomach body and a feeding tube terminates  in the stomach antrum. Scattered diverticula of the colon most concentrated in the ascending and sigmoid colon. No dilated bowel. Normal appendix. Vascular/Lymphatic: Aortoiliac atherosclerotic vascular disease. Mild stranding around the right common femoral artery likely related to recent catheterization common no substantial or masslike hematoma identified. Reproductive: Borderline prostatomegaly. Other: No supplemental non-categorized findings. Musculoskeletal: Degenerative disc disease and degenerative facet arthropathy in the lower lumbar spine. IMPRESSION: 1. A specific cause for the patient's nausea and vomiting is not identified. 2. Continued bibasilar airspace opacities with air bronchograms especially posteriorly in the lower lobes, some of the right middle lobe airspace opacity has resolved. Small right pleural effusion. 3. Mild dorsal pancreatic duct dilatation in the pancreatic body and head, up to 0.4 cm diameter. No specific cause is identified. 4. Atrophic left kidney with multiple cysts, including a rim calcified lesion which is unchanged. 5. The T2 hyperintense lesion in segment 7 of the liver on prior thoracic spine MRI is not well seen on today's CT scan. The region is partially obscured by streak artifact from the patient's arms. 6. Other imaging findings of potential clinical significance: Coronary atherosclerosis and mild cardiomegaly. Borderline prostatomegaly. Degenerative disc disease and degenerative facet arthropathy in the lower lumbar spine. 7. Aortic atherosclerosis. Aortic Atherosclerosis (ICD10-I70.0). Electronically Signed   By: Van Clines M.D.   On: 11/07/2020  11:53   DG CHEST PORT 1 VIEW  Result Date: 11/08/2020 CLINICAL DATA:  Hypoxia EXAM: PORTABLE CHEST 1 VIEW COMPARISON:  November 06, 2020 FINDINGS: Endotracheal tube tip is 5.1 cm above the carina. Nasogastric tube tip and side port are below the diaphragm. Left subclavian catheter tip is in the left innominate vein near the junction with the superior vena cava. Right subclavian catheter tip is in the right atrium slightly beyond the cavoatrial junction. No pneumothorax. There is a small right pleural effusion. There is ill-defined airspace opacity in each lower lung region, primarily due to atelectasis with potential superimposed bibasilar pneumonia. There is cardiomegaly with pulmonary venous hypertension. No adenopathy. Evidence of old healed right clavicle fracture. There is aortic atherosclerosis. IMPRESSION: Tube and catheter positions as described without pneumothorax. Small right pleural effusion. Ill-defined opacity in the lung bases, slightly increased, likely due to combination of atelectasis and potential bibasilar pneumonia. Stable cardiomegaly with a degree of pulmonary vascular congestion. Aortic Atherosclerosis (ICD10-I70.0). Electronically Signed   By: Lowella Grip III M.D.   On: 11/08/2020 08:15   DG CHEST PORT 1 VIEW  Result Date: 11/06/2020 CLINICAL DATA:  Status post endotracheal intubation EXAM: PORTABLE CHEST 1 VIEW COMPARISON:  Film from earlier in the same day. FINDINGS: Endotracheal tube is again identified and stable. Right jugular temporary dialysis catheter is noted. Feeding catheter is seen extending into the stomach. New left subclavian central line is noted with the catheter tip at the confluence of the innominate veins. No pneumothorax is seen. Patchy persistent atelectasis is noted in the right lung base with associated effusion. IMPRESSION: Tubes and lines as described above. No pneumothorax following central line placement. Stable airspace opacity and effusion in the  right base. Electronically Signed   By: Inez Catalina M.D.   On: 11/06/2020 21:03   DG Abd Portable 1V  Result Date: 11/08/2020 CLINICAL DATA:  Feeding tube placement and orogastric tube placement EXAM: PORTABLE ABDOMEN - 1 VIEW COMPARISON:  Portable exam 1217 hours compared to 11/07/2020 FINDINGS: Central venous catheter tip projects over RIGHT atrium. Feeding tube coiled in stomach. Nasogastric tube tip in stomach. Gaseous distention of stomach new  since prior exam. Scattered residual contrast throughout colon. Rounded calcification projects over LEFT kidney corresponding to a calcified cyst on a prior CT. Osseous structures unremarkable. IMPRESSION: Nasogastric tube and coiled feeding tube both located within a stomach distended by gas. Electronically Signed   By: Lavonia Dana M.D.   On: 11/08/2020 12:34   US Abdomen Limited RUQ (LIVER/GB)  Result Date: 11/07/2020 CLINICAL DATA:  Nausea vomiting for several hours EXAM: ULTRASOUND ABDOMEN LIMITED RIGHT UPPER QUADRANT COMPARISON:  CT from earlier in the same day. FINDINGS: Gallbladder: Gallbladder is well distended with evidence of gallbladder sludge and multiple gallbladder polyps. No stones are seen. No wall thickening or pericholecystic fluid is noted. Common bile duct: Diameter: 6.6 mm. Liver: The hepatic echogenicity is within normal limits with the exception of a focal area of increased echogenicity measuring 2.3 cm in greatest dimension. This is consistent with a small hemangioma. This is also stable from recent MRI examination. Portal vein is patent on color Doppler imaging with normal direction of blood flow towards the liver. Other: None. IMPRESSION: Gallbladder sludge and gallbladder polyps without complicating factors. Changes consistent with hepatic hemangioma. Electronically Signed   By: Inez Catalina M.D.   On: 11/07/2020 19:47    Cardiac Studies     Patient Profile     59 y.o. male with past medical history of end-stage renal disease  on hemodialysis, essential hypertension, dilated ascending aorta and hyperlipidemia who presented on November 20 with anterior ST elevation myocardial infarction.  Assessment & Plan    1.  Anterior ST elevation myocardial infarction: Status post PCI and drug-eluting stent placement to the LAD.  The patient has severe residual disease affecting the AV groove left circumflex as well as right coronary artery.  Initial plan was for staged PCI.  However, given multiple other medical issues at the present time including respiratory failure, anemia and underlying sepsis, this has been put on hold. Brilinta was switched to IV Cangrelor due to concerns about absorption related to an ileus.  Once bowel function improves, can switch back to Brilinta 90 mg twice daily.  2.  Paroxysmal atrial fibrillation: Maintaining sinus rhythm with IV amiodarone.  He is on heparin drip.  3.  End-stage renal disease on hemodialysis: Managed by nephrology  4.  MSSA bacteremia: Followed by infectious diseases service.  TEE showed no evidence of vegetation.  Findings discussed with family at bedside.  Overall prognosis seems to be poor.  For questions or updates, please contact [place] Please consult [email or web address] for contact info under        Signed, Kathlyn Sacramento, MD  11/08/2020, 3:51 PM

## 2020-11-08 NOTE — Progress Notes (Signed)
[facility] Infectious [facility]  Date of Admission:  10/28/2020           Current antibiotics: Total days of antibiotics: 10                                                                            Current antibiotics: 11/22-c cefazolin  11/21 vanc/piptazo  Reason for visit: Follow up on MSSA bacteremia  ASSESSMENT/PLAN:    Travis [name] is a 59 y.o. male with past medical history of hypertension, end-stage renal disease on intermittent hemodialysis, coronary artery disease, admitted with sepsis and anterior wall STEMI status post emergent PCI.  Found to have septic shock post procedure and MSSA bacteremia.  Repeat blood cultures are negative and transesophageal echo without definite evidence of vegetations (AV echodensities noted that could be consistent with calcification in a patient with end-stage renal disease).  CT chest from 11/23 with findings of pneumonia and sputum cultures with Staph aureus.  [name] 11/27 without abscess at AV fistula site.   CT abd/pelvis 11/24 without significant findings for infection.  MRI brain and spine 11/29 negative.  Repeat CT abd/pelvis 11/30 unremarkable for source.  Interval 24hrs: -- low grade fever this morning 100.4 -- WBC improved -- RUQ [name] with GB sludge, hepatic hemangioma -- Repeat blood cultures drawn ---> 1 of 2 MRSE and dosed with vancomycin  -- hopefully transitioning to iHD  #MSSA bacteremia #Pneumonia #Leukocytosis #ESRD #STEMI status post PCI  --Continue renally dosed cefazolin --1 of 2 cultures with Staph epi s/p Vanco dose.  Hold further doses for now as more likely to be contaminant.  --Follow fevers, WBC --Anticipate treating for prolonged course (ie 6 weeks total)   MEDICATIONS:    Scheduled Meds: . sodium chloride   Intravenous Once  . aspirin  81 mg Per Tube Daily  . atorvastatin  80 mg Per Tube q1800  . B-complex with vitamin C  1 tablet Per Tube Daily  . bisacodyl  10 mg Rectal BID  .  chlorhexidine gluconate (MEDLINE KIT)  15 mL Mouth Rinse BID  . Chlorhexidine Gluconate Cloth  6 each Topical Q0600  . clonazepam  1 mg Per Tube BID  . docusate  100 mg Per Tube BID  . doxercalciferol  5 mcg Intravenous Q M,W,F  . feeding supplement (PROSource TF)  45 mL Per Tube QID  . insulin aspart  0-6 Units Subcutaneous Q4H  . lidocaine  1 patch Transdermal Q24H  . mouth rinse  15 mL Mouth Rinse 10 times per day  . metoCLOPramide (REGLAN) injection  10 mg Intravenous Q12H  . pantoprazole (PROTONIX) IV  40 mg Intravenous Q12H  . polyethylene glycol  17 g Per Tube Daily  . QUEtiapine  100 mg Per Tube BID  . senna  1 tablet Per Tube QHS  . sevelamer carbonate  0.8 g Per Tube TID  . sodium chloride flush  10-40 mL Intracatheter Q12H  . sodium chloride flush  3 mL Intravenous Q12H  . Thrombi-Pad  1 each Topical Once    Continuous Infusions: . sodium chloride Stopped (11/03/20 2145)  . sodium chloride Stopped (10/28/20 0954)  . sodium chloride Stopped (11/04/20 0016)  .  amiodarone 30 mg/hr (11/08/20 1000)  . cangrelor 50 mg in NS 250 mL 0.75 mcg/kg/min (11/08/20 1000)  .  ceFAZolin (ANCEF) IV    . feeding supplement (VITAL AF 1.2 CAL) Stopped (11/07/20 0700)  . heparin 1,750 Units/hr (11/08/20 1000)  . HYDROmorphone 3 mg/hr (11/08/20 1000)  . norepinephrine (LEVOPHED) Adult infusion 14 mcg/min (11/08/20 1000)  . propofol (DIPRIVAN) infusion Stopped (11/05/20 0908)  . vasopressin 0.03 Units/min (11/08/20 1000)    PRN Meds: sodium chloride, acetaminophen, bisacodyl, HYDROmorphone, HYDROmorphone (DILAUDID) injection, HYDROmorphone (DILAUDID) injection, LORazepam, midazolam, nitroGLYCERIN, ondansetron (ZOFRAN) IV, sodium chloride flush, sodium chloride flush  SUBJECTIVE:   Unable to obtain due to being intubated and sedated.    OBJECTIVE:   Allergies  Allergen Reactions  . Ibuprofen Hives  . Lisinopril Swelling    PT states he is allergic to all prils; caused facial  swelling  . Naproxen Hives and Other (See Comments)    Alleve causes patient to have hives    Blood pressure 138/82, pulse (!) 106, temperature 99.8 F (37.7 C), temperature source Oral, resp. rate (!) 23, height 5' 11" (1.803 m), weight 80.6 kg, SpO2 100 %. Body mass index is 24.78 kg/m.  Physical Exam  General: Patient is currently intubated and sedated on the ventilator. Wife is present.  Eyes open.  Weaning on ventilator Head: Normocephalic and atraumatic.  ET tube in place. OG in place Pulmonary/Chest: Breathing assisted on vent; symmetric chest rise and fall. Musculoskeletal: No joint deformities Neurological: Sedated Skin: Warm, dry and intact. No rashes or erythema.  HD cath Right IJ.  Left CVC  Lab Results & Microbiology Lab Results  Component Value Date   WBC 19.8 (H) 11/08/2020   HGB 7.1 (L) 11/08/2020   HCT 22.8 (L) 11/08/2020   MCV 99.6 11/08/2020   PLT 192 11/08/2020    Lab Results  Component Value Date   NA 134 (L) 11/08/2020   K 5.5 (H) 11/08/2020   CO2 16 (L) 11/08/2020   GLUCOSE 89 11/08/2020   BUN 112 (H) 11/08/2020   CREATININE 7.43 (H) 11/08/2020   CALCIUM 9.4 11/08/2020   GFRNONAA 8 (L) 11/08/2020   GFRAA 6 (L) 02/26/2018    Lab Results  Component Value Date   ALT 5 11/08/2020   AST 53 (H) 11/08/2020   ALKPHOS 137 (H) 11/08/2020   BILITOT 1.9 (H) 11/08/2020     I have reviewed the micro and lab results in Epic.  Imaging DG Abd 1 View  Result Date: 11/07/2020 CLINICAL DATA:  Orogastric tube placement EXAM: ABDOMEN - 1 VIEW COMPARISON:  None. FINDINGS: Orogastric tube tip is seen within the proximal body of the stomach with its proximal side hole seen at the level of gastroesophageal junction. Nasoenteric feeding tube tip noted within the distal stomach in the region of the pylorus. Normal abdominal gas pattern. No gross free intraperitoneal gas. Right basilar atelectasis or infiltrate. IMPRESSION: Orogastric tube tip just within the gastric  lumen. Advancement by 5-10 cm may better position the tube. Nasoenteric feeding tube tip in the region of the pylorus. Advancement of the tube by 10-15 cm should allow eventual passage into the proximal duodenum. Electronically Signed   By: Fidela Salisbury MD   On: 11/07/2020 06:17   MR BRAIN WO CONTRAST  Result Date: 11/06/2020 CLINICAL DATA:  History media, concern for septic emboli. EXAM: MRI HEAD WITHOUT CONTRAST TECHNIQUE: Multiplanar, multiecho pulse sequences of the brain and surrounding structures were obtained without intravenous contrast. COMPARISON:  Brain MRI/MRA  11/13/2012. FINDINGS: Brain: Cerebral volume is normal. Minimal multifocal T2/FLAIR hyperintensity within the cerebral white matter is nonspecific, but compatible with chronic small vessel ischemic disease. There is no acute infarct. No evidence of intracranial mass. No chronic intracranial blood products. No extra-axial fluid collection. No midline shift. Vascular: Expected proximal arterial flow voids. Skull and upper cervical spine: No focal suspicious marrow lesion. Sinuses/Orbits: Visualized orbits show no acute finding. Mild ethmoid and left sphenoid sinus mucosal thickening. Small volume frothy secretions are also present within the left sphenoid sinus. Other: Bilateral mastoid effusions. Partially visualized support tubes. Secretions within the nasopharynx and oropharynx. IMPRESSION: No non-contrast evidence of acute intracranial abnormality. Mild cerebral white matter chronic small vessel ischemic disease. Ethmoid and left sphenoid sinusitis. Bilateral mastoid effusions. Electronically Signed   By: Kellie Simmering DO   On: 11/06/2020 16:23   MR CERVICAL SPINE WO CONTRAST  Result Date: 11/06/2020 CLINICAL DATA:  Neck pain, acute, infection suspected; bacteremia, not moving lower extremities. EXAM: MRI CERVICAL SPINE WITHOUT CONTRAST TECHNIQUE: Multiplanar, multisequence MR imaging of the cervical spine was performed. No intravenous  contrast was administered. COMPARISON:  No pertinent prior exams available for comparison. FINDINGS: Alignment: No significant spondylolisthesis. Vertebrae: Diffuse abnormal hypointense marrow signal throughout the cervical and visualized upper thoracic spine, nonspecific but likely related to the patient's known end-stage renal disease. Trace edema along the C6 inferior endplate is nonspecific, but likely degenerative. No significant marrow edema is identified elsewhere within the cervical spine. Cord: No spinal cord signal abnormality Posterior Fossa, vertebral arteries, paraspinal tissues: Posterior fossa better assessed on the concurrently performed brain MRI. Flow voids preserved within the imaged cervical vertebral arteries. Partially imaged life support tubes. Prominent secretions within the nasopharynx and oropharynx. Paraspinal soft tissues unremarkable. Disc levels: Mild multilevel disc degeneration greatest at C6-C7. C2-C3: Disc osteophyte ridge/uncinate hypertrophy on the right. Minimal partial effacement of the ventral thecal sac on the right without significant central canal stenosis. Mild right neural foraminal narrowing. C3-C4: Mild uncinate and facet hypertrophy on the right. No significant disc herniation or spinal canal stenosis. Mild right neural foraminal narrowing. Trace left facet joint effusion (series 8, image 16). C4-C5: Uncinate hypertrophy (greater on the right). Minimal facet hypertrophy. No significant disc herniation or spinal canal stenosis. Moderate/severe neural foraminal narrowing. Small left facet joint effusion (series 8, image 21). C5-C6: Left center disc protrusion (series 9, image 26). Uncinate hypertrophy (greater on the right). Minimal facet hypertrophy. The disc protrusion contributes to mild relative spinal canal narrowing. Moderate right neural foraminal narrowing. Trace left facet joint effusion (series 8, image 25) C6-C7: Shallow broad-based left center disc protrusion.  Mild uncovertebral and facet hypertrophy. No significant spinal canal stenosis. Bilateral neural foraminal narrowing (moderate right, mild left). Small left facet joint effusion (series 8, image 30). C7-T1: No significant disc herniation or stenosis. Trace left facet joint effusion. IMPRESSION: Trace or small facet joint effusions on the left at C3-C4, C4-C5, C5-C6, C6-C7 and C7-T1 are nonspecific and may be degenerative in etiology. Given the provided history of bacteremia, facet joint septic arthritis is difficult to definitively exclude, but the lack of any surrounding marrow edema argues against this. Minimal edema along the C6 inferior endplate is likely degenerative. Cervical spondylosis as outline. At C5-C6, a left center disc protrusion contributes to mild relative spinal canal narrowing. No significant canal stenosis at the remaining levels. Multilevel neural foraminal narrowing greatest on the right at C4-C5 (moderate/severe), on the right at C5-C6 (moderate) and on the right at C6-C7 (moderate).  Diffuse abnormal T1 hypointense marrow signal, nonspecific but likely related to the patient's end-stage renal disease. Electronically Signed   By: Kellie Simmering DO   On: 11/06/2020 16:39   MR THORACIC SPINE WO CONTRAST  Result Date: 11/06/2020 CLINICAL DATA:  Mid back pain; bacteremia, not moving lower extremity. EXAM: MRI THORACIC SPINE WITHOUT CONTRAST TECHNIQUE: Multiplanar, multisequence MR imaging of the thoracic spine was performed. No intravenous contrast was administered. COMPARISON:  CT chest 10/31/2020. FINDINGS: Mildly motion degraded examination. Alignment: Thoracic levocurvature. No significant spondylolisthesis. Vertebrae: Vertebral body height is maintained. Multilevel degenerative endplate irregularity and small Schmorl nodes. Mild edema along the T6 inferior endplate, at site of a Schmorl node, likely degenerative. No significant marrow edema identified elsewhere within the thoracic spine.  Diffuse hypointense marrow signal throughout the thoracic spine, nonspecific but likely related to the patient's end-stage renal disease. Cord: Within the limitations of motion degradation, there is no convincing spinal cord signal abnormality. Paraspinal and other soft tissues: Paraspinal soft tissues within normal limits. Incompletely assessed multifocal airspace disease and cavitary pulmonary lesions. Small right pleural effusion. 13 mm T2 hyperintense lesion within the hepatic dome, incompletely assessed on this noncontrast examination. Disc levels: No more than mild disc degeneration at any level. No focal disc herniation, spinal canal stenosis or neural foraminal narrowing at any level. IMPRESSION: Mild edema along the T6 inferior endplate, at site of a Schmorl node, likely degenerative. No significant marrow edema elsewhere within the thoracic spine. Mild thoracic spondylosis. No focal disc herniation, spinal canal stenosis or neural foraminal narrowing at any level. Diffuse hypointense marrow signal throughout the thoracic spine, likely related to the patient's end-stage renal disease. Incompletely assessed multifocal airspace disease and cavitary pulmonary lesions. Small right pleural effusion. 13 mm T2 hyperintense lesion within the hepatic dome, incompletely assessed on this non-contrast examination. Electronically Signed   By: Kellie Simmering DO   On: 11/06/2020 17:05   MR LUMBAR SPINE WO CONTRAST  Result Date: 11/06/2020 CLINICAL DATA:  Back pain, infection suspected; bacteremia, not moving legs. EXAM: MRI LUMBAR SPINE WITHOUT CONTRAST TECHNIQUE: Multiplanar, multisequence MR imaging of the lumbar spine was performed. No intravenous contrast was administered. COMPARISON:  CT abdomen/pelvis 11/01/2020. FINDINGS: Segmentation:  5 lumbar vertebrae. Alignment:  No significant spondylolisthesis. Vertebrae: Vertebral body height is maintained. Multilevel degenerative endplate irregularity with small Schmorl  nodes. There is trace multilevel endplate edema, largely adjacent to Schmorl nodes, and findings are likely degenerative in etiology. No suspicious vertebral body marrow edema to suggest discitis/osteomyelitis on the current exam. Mild marrow edema along the L4-L5 and L5-S1 facet joints. Diffuse hypointense marrow signal throughout the lumbar spine nonspecific but likely related to the patient's end-stage renal disease. Conus medullaris and cauda equina: Conus extends to the L1 level. Conus and cauda equina appear normal. Paraspinal and other soft tissues: Layering hypointense signal within the gallbladder likely reflecting cholecystolithiasis. Nonspecific edema signal within the lumbar paraspinal soft tissues. Disc levels: No more than mild disc degeneration at any level. No significant disc herniation, spinal canal stenosis or neural foraminal narrowing at the T12-L1 through L3-L4 levels. L4-L5: Small disc bulge. Facet arthrosis with ligamentum flavum hypertrophy. Trace bilateral facet joint effusions. No significant spinal canal stenosis or neural foraminal narrowing. L5-S1: Disc bulge. Superimposed broad-based central disc protrusion eccentric to the right at site of posterior annular fissure. Facet arthrosis with mild ligamentum flavum hypertrophy. Mild right subarticular narrowing with the disc protrusion likely contacting the descending right S1 nerve root. Central canal patent. Mild relative left  neural foraminal narrowing. IMPRESSION: Trace bilateral L4-L5 facet joint effusions with mild surrounding marrow edema. Findings are nonspecific and could certainly be degenerative given the facet arthrosis present at this level. Facet joint septic arthrosis is difficult to definitively exclude given the provided history of bacteremia. Mild marrow edema along the bilateral L5-S1 facet joints is nonspecific, but favored degenerative given the significant facet arthrosis at this level and lack of facet joint  effusions. Trace multilevel endplate edema appears degenerative. Lumbar spondylosis as outlined. No more than mild spinal canal or neural foraminal narrowing at any level. Most notably, and L5-S1 disc protrusion contributes to multifactorial mild right subarticular narrowing and contacts the descending right S1 nerve root. Diffuse hypointense marrow signal likely related to the patient's end-stage renal disease. Nonspecific edema within the bilateral lumbar paraspinal soft tissues. Suspected cholecystolithiasis. Electronically Signed   By: Kellie Simmering DO   On: 11/06/2020 16:53   CT ABDOMEN PELVIS W CONTRAST  Result Date: 11/07/2020 CLINICAL DATA:  Nausea and vomiting EXAM: CT ABDOMEN AND PELVIS WITH CONTRAST TECHNIQUE: Multidetector CT imaging of the abdomen and pelvis was performed using the standard protocol following bolus administration of intravenous contrast. CONTRAST:  139m OMNIPAQUE IOHEXOL 300 MG/ML  SOLN COMPARISON:  Multiple exams, including 11/01/2020 and abdominal radiograph from 11/07/2020 FINDINGS: Lower chest: Coronary atherosclerosis and mild cardiomegaly. Small right pleural effusion. Continued bibasilar airspace opacities with air bronchograms especially posteriorly in the lower lobes, some of the right middle lobe airspace opacity has resolved. Hepatobiliary: Dependent density in the gallbladder possibly probably from prior contrast excretion. On prior MRI thoracic spine there was a T2 hyperintense lesion in the dome of the right hepatic lobe posteriorly, this is not readily apparent on today's CT scan although this partly may be from streak artifact related to the patient's arm positioning. Pancreas: Mild dorsal pancreatic duct dilatation is observed in the pancreatic body and head, up to 0.4 cm diameter. No specific cause is identified. Spleen: Unremarkable Adrenals/Urinary Tract: Adrenal glands unremarkable. Right nephrectomy. Atrophic left kidney with multiple cysts, including a rim  calcified lesion which is unchanged. Empty urinary bladder. Stomach/Bowel: Nasogastric tube terminates the stomach body and a feeding tube terminates in the stomach antrum. Scattered diverticula of the colon most concentrated in the ascending and sigmoid colon. No dilated bowel. Normal appendix. Vascular/Lymphatic: Aortoiliac atherosclerotic vascular disease. Mild stranding around the right common femoral artery likely related to recent catheterization common no substantial or masslike hematoma identified. Reproductive: Borderline prostatomegaly. Other: No supplemental non-categorized findings. Musculoskeletal: Degenerative disc disease and degenerative facet arthropathy in the lower lumbar spine. IMPRESSION: 1. A specific cause for the patient's nausea and vomiting is not identified. 2. Continued bibasilar airspace opacities with air bronchograms especially posteriorly in the lower lobes, some of the right middle lobe airspace opacity has resolved. Small right pleural effusion. 3. Mild dorsal pancreatic duct dilatation in the pancreatic body and head, up to 0.4 cm diameter. No specific cause is identified. 4. Atrophic left kidney with multiple cysts, including a rim calcified lesion which is unchanged. 5. The T2 hyperintense lesion in segment 7 of the liver on prior thoracic spine MRI is not well seen on today's CT scan. The region is partially obscured by streak artifact from the patient's arms. 6. Other imaging findings of potential clinical significance: Coronary atherosclerosis and mild cardiomegaly. Borderline prostatomegaly. Degenerative disc disease and degenerative facet arthropathy in the lower lumbar spine. 7. Aortic atherosclerosis. Aortic Atherosclerosis (ICD10-I70.0). Electronically Signed   By: WCindra EvesD.  On: 11/07/2020 11:53   DG CHEST PORT 1 VIEW  Result Date: 11/08/2020 CLINICAL DATA:  Hypoxia EXAM: PORTABLE CHEST 1 VIEW COMPARISON:  November 06, 2020 FINDINGS: Endotracheal tube  tip is 5.1 cm above the carina. Nasogastric tube tip and side port are below the diaphragm. Left subclavian catheter tip is in the left innominate vein near the junction with the superior vena cava. Right subclavian catheter tip is in the right atrium slightly beyond the cavoatrial junction. No pneumothorax. There is a small right pleural effusion. There is ill-defined airspace opacity in each lower lung region, primarily due to atelectasis with potential superimposed bibasilar pneumonia. There is cardiomegaly with pulmonary venous hypertension. No adenopathy. Evidence of old healed right clavicle fracture. There is aortic atherosclerosis. IMPRESSION: Tube and catheter positions as described without pneumothorax. Small right pleural effusion. Ill-defined opacity in the lung bases, slightly increased, likely due to combination of atelectasis and potential bibasilar pneumonia. Stable cardiomegaly with a degree of pulmonary vascular congestion. Aortic Atherosclerosis (ICD10-I70.0). Electronically Signed   By: Lowella Grip III M.D.   On: 11/08/2020 08:15   DG CHEST PORT 1 VIEW  Result Date: 11/06/2020 CLINICAL DATA:  Status post endotracheal intubation EXAM: PORTABLE CHEST 1 VIEW COMPARISON:  Film from earlier in the same day. FINDINGS: Endotracheal tube is again identified and stable. Right jugular temporary dialysis catheter is noted. Feeding catheter is seen extending into the stomach. New left subclavian central line is noted with the catheter tip at the confluence of the innominate veins. No pneumothorax is seen. Patchy persistent atelectasis is noted in the right lung base with associated effusion. IMPRESSION: Tubes and lines as described above. No pneumothorax following central line placement. Stable airspace opacity and effusion in the right base. Electronically Signed   By: Inez Catalina M.D.   On: 11/06/2020 21:03   US Abdomen Limited RUQ (LIVER/GB)  Result Date: 11/07/2020 CLINICAL DATA:  Nausea  vomiting for several hours EXAM: ULTRASOUND ABDOMEN LIMITED RIGHT UPPER QUADRANT COMPARISON:  CT from earlier in the same day. FINDINGS: Gallbladder: Gallbladder is well distended with evidence of gallbladder sludge and multiple gallbladder polyps. No stones are seen. No wall thickening or pericholecystic fluid is noted. Common bile duct: Diameter: 6.6 mm. Liver: The hepatic echogenicity is within normal limits with the exception of a focal area of increased echogenicity measuring 2.3 cm in greatest dimension. This is consistent with a small hemangioma. This is also stable from recent MRI examination. Portal vein is patent on color Doppler imaging with normal direction of blood flow towards the liver. Other: None. IMPRESSION: Gallbladder sludge and gallbladder polyps without complicating factors. Changes consistent with hepatic hemangioma. Electronically Signed   By: Inez Catalina M.D.   On: 11/07/2020 19:47      Rayn[facility] [phone] pager 11/08/2020, 11:16 AM

## 2020-11-08 NOTE — Progress Notes (Signed)
PHARMACY - PHYSICIAN COMMUNICATION CRITICAL VALUE ALERT - BLOOD CULTURE IDENTIFICATION (BCID)  Travis [name] is an 59 y.o. male who presented to [facility] on 10/28/2020 with a chief complaint of chest tightness.  Assessment:  Admitted as code STEMI, admission complicated by acute respiratory failure and MSSA PNA/sepsis, now growing resistant staph epi in 1 of 2 blood culture bottles; ID is following for MSSA.  Name of physician (or Provider) Contacted: SSommer  Current antibiotics: Ancef  Changes to prescribed antibiotics recommended:  Recommendations accepted by provider -- will give one dose of vancomycin until ID can reassess in am.  Results for orders placed or performed during the hospital encounter of 10/28/20  Blood Culture ID Panel (Reflexed) (Collected: 11/07/2020 12:29 AM)  Result Value Ref Range   Enterococcus faecalis NOT DETECTED NOT DETECTED   Enterococcus Faecium NOT DETECTED NOT DETECTED   Listeria monocytogenes NOT DETECTED NOT DETECTED   Staphylococcus species DETECTED (A) NOT DETECTED   Staphylococcus aureus (BCID) NOT DETECTED NOT DETECTED   Staphylococcus epidermidis DETECTED (A) NOT DETECTED   Staphylococcus lugdunensis NOT DETECTED NOT DETECTED   Streptococcus species NOT DETECTED NOT DETECTED   Streptococcus agalactiae NOT DETECTED NOT DETECTED   Streptococcus pneumoniae NOT DETECTED NOT DETECTED   Streptococcus pyogenes NOT DETECTED NOT DETECTED   A.calcoaceticus-baumannii NOT DETECTED NOT DETECTED   Bacteroides fragilis NOT DETECTED NOT DETECTED   Enterobacterales NOT DETECTED NOT DETECTED   Enterobacter cloacae complex NOT DETECTED NOT DETECTED   Escherichia coli NOT DETECTED NOT DETECTED   Klebsiella aerogenes NOT DETECTED NOT DETECTED   Klebsiella oxytoca NOT DETECTED NOT DETECTED   Klebsiella pneumoniae NOT DETECTED NOT DETECTED   Proteus species NOT DETECTED NOT DETECTED   Salmonella species NOT DETECTED NOT DETECTED   Serratia marcescens NOT  DETECTED NOT DETECTED   Haemophilus influenzae NOT DETECTED NOT DETECTED   Neisseria meningitidis NOT DETECTED NOT DETECTED   Pseudomonas aeruginosa NOT DETECTED NOT DETECTED   Stenotrophomonas maltophilia NOT DETECTED NOT DETECTED   Candida albicans NOT DETECTED NOT DETECTED   Candida auris NOT DETECTED NOT DETECTED   Candida glabrata NOT DETECTED NOT DETECTED   Candida krusei NOT DETECTED NOT DETECTED   Candida parapsilosis NOT DETECTED NOT DETECTED   Candida tropicalis NOT DETECTED NOT DETECTED   Cryptococcus neoformans/gattii NOT DETECTED NOT DETECTED   Methicillin resistance mecA/C DETECTED (A) NOT DETECTED    Wynona Neat, PharmD, BCPS  11/08/2020  3:28 AM

## 2020-11-08 NOTE — Progress Notes (Signed)
Nutrition Follow-up  DOCUMENTATION CODES:   Not applicable  INTERVENTION:    Hopefully can resume TF within 24 hours post further gastric decompression today  Tube Feeding Recommendations via Cortrak (gastric) once able to resume: Vital AF 1.2 at 20 ml/hr, goal rate of 60 ml/hr Pro-Source 45 mL TID Provides 141 g of protein, 1848 kcals, 1166 mL of free water    NUTRITION DIAGNOSIS:   Increased nutrient needs related to acute illness as evidenced by estimated needs.  Being addressed via TF   GOAL:   Patient will meet greater than or equal to 90% of their needs  Progressing  MONITOR:   Vent status, Skin, TF tolerance, Weight trends, Labs, I & O's  REASON FOR ASSESSMENT:   Ventilator    ASSESSMENT:   Patient with PMH significant for HTN and ESRD in HD. Presents this admission with STEMI.  11/20 PCI to LAD 11/23 Intubated 11/24 OG tube in esophagus, unable to advance 11/26 Cortrak attempted but unable to advance past 55 cm, likely at GE junction. DR advanced to duodenal bulb; TF initiated 11/30 Brown malodorous output from mouth and OG, TF held, CT negative for obstruction, ileus with no BM x 10 days. Abd xray with OG tube GE junction and Cortrak now retraced back into stomach; both needing advancement 12/01 iHD not tolerated, CRRT to resume, +large amount of liquid stool post enema, Attempted Cortrak advancement but unsuccesful  Pt remains sedated on vent support, requiring vasopressin and levophed Attempted iHD today but did not tolerate, CRRT to be restarted  TF on hold after stool appearing output from mouth on 11/30, OG placed for decompression. Yesterday with significant output from OG that looked and smelled like stool. Abdominal scans negative for obstruction, GB with sludge but no cholecystitis, likely ileus related to constipation.   Reglan started yesterday Pt received SMOG enema today with positive results, >1.5 L stool output  Attempted advancement of  Cortrak into postpyloric position but unsuccessful, Cortrak and OG tip remain in stomach. Abd xray also indicating significant gaseous distention. Plan for further decompression today  Noted skin breakdown on sacrum when pt being cleaned up post BM today  Current wt 80.5 kg; admit weight 86 kg; lowest weight 76 kg on 11/29. Net +3.5 L. Noted outpatient EDW 81.5 kg; utilizing 76 kg as EDW given suspected acute wt loss plus edema present  Labs: sodium 134 (L), potassium 5.5, phosphorus 7.8 (H) Meds: reglan, renvela,    Diet Order:   Diet Order    None      EDUCATION NEEDS:   Not appropriate for education at this time  Skin:  Skin Assessment: (P) Skin Integrity Issues: (skin tear vs pressure injury to sacrum noted today, not yet documented by RN)  Last BM:  12/1  Height:   Ht Readings from Last 1 Encounters:  10/28/20 5\' 11"  (1.803 m)    Weight:   Wt Readings from Last 1 Encounters:  11/08/20 80.5 kg    BMI:  Body mass index is 24.75 kg/m.  Estimated Nutritional Needs:   Kcal:  1825-2190 kcals  Protein:  125-150 g  Fluid:  1000 ml + UOP (liberalized with CRRT)   Kerman Passey MS, RDN, LDN, CNSC Registered Dietitian III Clinical Nutrition RD Pager and On-Call Pager Number Located in [place]

## 2020-11-08 NOTE — Progress Notes (Addendum)
ANTICOAGULATION CONSULT NOTE  Pharmacy Consult for Heparin Indication: chest pain/ACS with no intervention and Afib   Patient Measurements: Height: 5\' 11"  (180.3 cm) Weight: 80.5 kg (177 lb 7.5 oz) IBW/kg (Calculated) : 75.3 Heparin Dosing Weight: 86.4 kg   Vital Signs: Temp: 98.6 F (37 C) (12/01 1345) Temp Source: Axillary (12/01 1345) BP: 122/65 (12/01 1800) Pulse Rate: 95 (12/01 1800)  Labs: Recent Labs    11/06/20 0205 11/06/20 1041 11/07/20 0241 11/07/20 0242 11/07/20 0540 11/07/20 0617 11/07/20 0631 11/07/20 0631 11/07/20 1132 11/07/20 1613 11/08/20 0347 11/08/20 1406 11/08/20 1706  HGB 7.0*   < >  --  7.1*   < >  --  7.8*   < >  --   --  7.1* 8.6*  --   HCT 22.2*   < >  --  23.2*   < >  --  23.0*  --   --   --  22.8* 26.5*  --   PLT 290  --   --  246  --   --   --   --   --   --  192  --   --   HEPARINUNFRC 0.50  --   --  0.15*   < >   < >  --   --   --  0.60 0.59  --  0.54  CREATININE 2.87*   < >   < >  --   --   --   --   --  5.78*  --  7.43*  --  6.94*   < > = values in this interval not displayed.    Estimated Creatinine Clearance: 12.4 mL/min (A) (by C-G formula based on SCr of 6.94 mg/dL (H)).  Assessment: 10 yom presenting with STEMI. Known to have dilated ascending aorta, ESRD. Found to have 99% mid LAD with multivessel in LCx and RCA, now s/p DES to LAD. Heparin was started post sheath removal. Patient was found to have bacteremia and will not proceed with cath intervention at this time. Patient developed Afib during admission treated with amiodarone and successful cardioversion, but has had episodes of Afib post cardioversion. Currently patient is in NSR. Patient has had unstable Hgb through admission requiring transfusion.   Hep lvl slightly above goal at 0.54 - now with bloody output from ng tube - the get blood  Goal of Therapy:  Heparin level 0.3-0.5 units/mL while on Cangrelor  Monitor platelets by anticoagulation protocol: Yes   Plan:   Hold heparin per dr Theodis Aguas, PharmD, BCPS, BCCCP Clinical Pharmacist [phone]  Please check AMION for all [place] numbers  11/08/2020 6:38 PM

## 2020-11-08 NOTE — Progress Notes (Signed)
Pt not able to tolerate regular dialysis, BP's dropped into 70's.  Will plan to resume CRRT.  Kelly Splinter, MD 11/08/2020, 3:00 PM

## 2020-11-08 NOTE — Progress Notes (Signed)
ANTICOAGULATION CONSULT NOTE  Pharmacy Consult for Heparin Indication: chest pain/ACS with no intervention and Afib   Patient Measurements: Height: 5\' 11"  (180.3 cm) Weight: 80.6 kg (177 lb 11.1 oz) IBW/kg (Calculated) : 75.3 Heparin Dosing Weight: 86.4 kg   Vital Signs: Temp: 98.4 F (36.9 C) (12/01 0806) Temp Source: Axillary (12/01 0806) BP: 185/70 (12/01 0802) Pulse Rate: 96 (12/01 0802)  Labs: Recent Labs    11/06/20 0205 11/06/20 0205 11/06/20 1041 11/07/20 0241 11/07/20 0242 11/07/20 0242 11/07/20 0540 11/07/20 0540 11/07/20 0617 11/07/20 0631 11/07/20 1132 11/07/20 1613 11/08/20 0347  HGB 7.0*   < >   < >  --  7.1*   < > 7.8*   < >  --  7.8*  --   --  7.1*  HCT 22.2*   < >   < >  --  23.2*   < > 23.0*  --   --  23.0*  --   --  22.8*  PLT 290  --   --   --  246  --   --   --   --   --   --   --  192  HEPARINUNFRC 0.50   < >  --   --  0.15*   < >  --   --  0.27*  --   --  0.60 0.59  CREATININE 2.87*  --   --  4.81*  --   --   --   --   --   --  5.78*  --  7.43*   < > = values in this interval not displayed.    Estimated Creatinine Clearance: 11.5 mL/min (A) (by C-G formula based on SCr of 7.43 mg/dL (H)).  Assessment: 76 yom presenting with STEMI. Known to have dilated ascending aorta, ESRD. Found to have 99% mid LAD with multivessel in LCx and RCA, now s/p DES to LAD. Heparin was started post sheath removal. Patient was found to have bacteremia and will not proceed with cath intervention at this time. Patient developed Afib during admission treated with amiodarone and successful cardioversion, but has had episodes of Afib post cardioversion. Currently patient is in NSR. Patient has had unstable Hgb through admission requiring transfusion.   Hgb down from yesterday to 7.1 this morning. Plt count also trending down to 192. No bleeding noted. Now with ileus and Cangrelor started overnight, will adjust heparin to lower goal. Level just above new goal this am.    Goal of Therapy:  Heparin level 0.3-0.5 units/mL while on Cangrelor  Monitor platelets by anticoagulation protocol: Yes   Plan:  Reduce heparin to 1750 units/hr. Daily hep lvl cbc No dose adjustments needed for cangrelor  Erin Hearing PharmD., BCPS Clinical Pharmacist 11/08/2020 8:14 AM  Please check AMION for all [place] numbers

## 2020-11-08 NOTE — Progress Notes (Addendum)
11:45 Received in report pt received an enema just before HD initiated, Pt on bed pan. Tx initiated, and xray present to verify NG tube. Pt removed from bedpan, 30 mins later pt had black tarry loose stool and had to be cleaned up. 1330 Pt began to not tolerate iHD. Primary RN went up on Levo and pt remains hypotensive. UF off, but pt SBP continues to drop.Pt had another large amount of loose tarry stool after pt's blood returned and HD tx was terminated.  Primary RN notified ICU team and HD RN notified Dr. Jonnie Finner. Both phyiscian in agreement for pt to switch to CRRT. Once blood returned, pt's VS 120/58

## 2020-11-08 NOTE — Progress Notes (Addendum)
Patient became hypotensive and unstable during hemodialysis, Dr. Carlis Abbott paged and RN ordered to stop HD, begin epinephrine and revert to CRRT once patient stabilized. CRRT began at 1700, patient BP labile and MAP holding at 64-65 on 40 Levophed and 20 Epinephrine. Bloody OG output increasing, CBC ordered and showed HgB dropped from 8.6 at 1400 to 7.7 at 1800, RN ordered to stop heparin gtt and one unit PRBCS ordered per Dr. Carlis Abbott verbal order.   Jill Poling, RN 11/08/2020

## 2020-11-08 NOTE — Plan of Care (Signed)
  Problem: Education: Goal: Understanding of CV disease, CV risk reduction, and recovery process will improve Outcome: Progressing Goal: Individualized Educational Video(s) Outcome: Progressing   Problem: Health Behavior/Discharge Planning: Goal: Ability to safely manage health-related needs after discharge will improve Outcome: Progressing   Problem: Clinical Measurements: Goal: Will remain free from infection Outcome: Progressing Goal: Cardiovascular complication will be avoided Outcome: Progressing   Problem: Safety: Goal: Ability to remain free from injury will improve Outcome: Progressing   Problem: Skin Integrity: Goal: Risk for impaired skin integrity will decrease Outcome: Progressing   Problem: Respiratory: Goal: Ability to maintain a clear airway and adequate ventilation will improve Outcome: Progressing   Problem: Activity: Goal: Ability to return to baseline activity level will improve Outcome: Not Progressing   Problem: Cardiovascular: Goal: Ability to achieve and maintain adequate cardiovascular perfusion will improve Outcome: Not Progressing   Problem: Education: Goal: Knowledge of General Education information will improve Description: Including pain rating scale, medication(s)/side effects and non-pharmacologic comfort measures Outcome: Not Progressing   Problem: Health Behavior/Discharge Planning: Goal: Ability to manage health-related needs will improve Outcome: Not Progressing   Problem: Clinical Measurements: Goal: Ability to maintain clinical measurements within normal limits will improve Outcome: Not Progressing Goal: Diagnostic test results will improve Outcome: Not Progressing Goal: Respiratory complications will improve Outcome: Not Progressing   Problem: Activity: Goal: Risk for activity intolerance will decrease Outcome: Not Progressing   Problem: Nutrition: Goal: Adequate nutrition will be maintained Outcome: Not Progressing    Problem: Coping: Goal: Level of anxiety will decrease Outcome: Not Progressing   Problem: Elimination: Goal: Will not experience complications related to bowel motility Outcome: Not Progressing   Problem: Pain Managment: Goal: General experience of comfort will improve Outcome: Not Progressing   Problem: Activity: Goal: Ability to tolerate increased activity will improve Outcome: Not Progressing   Problem: Role Relationship: Goal: Method of communication will improve Outcome: Not Progressing   Problem: Cardiovascular: Goal: Vascular access site(s) Level 0-1 will be maintained Outcome: Completed/Met

## 2020-11-09 ENCOUNTER — Encounter (HOSPITAL_COMMUNITY): Payer: Self-pay | Admitting: Cardiology

## 2020-11-09 ENCOUNTER — Encounter (HOSPITAL_COMMUNITY)
Admission: EM | Disposition: A | Discharge status: Home Health | Payer: Self-pay | Source: Home / Self Care | Attending: Cardiology

## 2020-11-09 ENCOUNTER — Inpatient Hospital Stay (HOSPITAL_COMMUNITY): Payer: Medicare Other

## 2020-11-09 DIAGNOSIS — R7881 Bacteremia: Secondary | ICD-10-CM | POA: Diagnosis not present

## 2020-11-09 DIAGNOSIS — Z9911 Dependence on respirator [ventilator] status: Secondary | ICD-10-CM | POA: Diagnosis not present

## 2020-11-09 DIAGNOSIS — I4891 Unspecified atrial fibrillation: Secondary | ICD-10-CM | POA: Diagnosis not present

## 2020-11-09 DIAGNOSIS — R578 Other shock: Secondary | ICD-10-CM

## 2020-11-09 DIAGNOSIS — I2102 ST elevation (STEMI) myocardial infarction involving left anterior descending coronary artery: Secondary | ICD-10-CM | POA: Diagnosis not present

## 2020-11-09 DIAGNOSIS — N186 End stage renal disease: Secondary | ICD-10-CM | POA: Diagnosis not present

## 2020-11-09 DIAGNOSIS — L8990 Pressure ulcer of unspecified site, unspecified stage: Secondary | ICD-10-CM | POA: Insufficient documentation

## 2020-11-09 DIAGNOSIS — I2109 ST elevation (STEMI) myocardial infarction involving other coronary artery of anterior wall: Secondary | ICD-10-CM | POA: Diagnosis not present

## 2020-11-09 DIAGNOSIS — D62 Acute posthemorrhagic anemia: Secondary | ICD-10-CM | POA: Diagnosis not present

## 2020-11-09 DIAGNOSIS — R509 Fever, unspecified: Secondary | ICD-10-CM

## 2020-11-09 DIAGNOSIS — J9601 Acute respiratory failure with hypoxia: Secondary | ICD-10-CM | POA: Diagnosis not present

## 2020-11-09 HISTORY — PX: ESOPHAGOGASTRODUODENOSCOPY: SHX5428

## 2020-11-09 LAB — RENAL FUNCTION PANEL
Albumin: 1.3 g/dL — ABNORMAL LOW (ref 3.5–5.0)
Albumin: 1.2 g/dL — ABNORMAL LOW (ref 3.5–5.0)
Anion gap: 16 — ABNORMAL HIGH (ref 5–15)
Anion gap: 13 (ref 5–15)
BUN: 64 mg/dL — ABNORMAL HIGH (ref 6–20)
BUN: 42 mg/dL — ABNORMAL HIGH (ref 6–20)
CO2: 20 mmol/L — ABNORMAL LOW (ref 22–32)
CO2: 20 mmol/L — ABNORMAL LOW (ref 22–32)
Calcium: 9.9 mg/dL (ref 8.9–10.3)
Calcium: 9.2 mg/dL (ref 8.9–10.3)
Chloride: 101 mmol/L (ref 98–111)
Chloride: 101 mmol/L (ref 98–111)
Creatinine, Ser: 5.03 mg/dL — ABNORMAL HIGH (ref 0.61–1.24)
Creatinine, Ser: 4.37 mg/dL — ABNORMAL HIGH (ref 0.61–1.24)
GFR, Estimated: 13 mL/min — ABNORMAL LOW (ref >60)
GFR, Estimated: 15 mL/min — ABNORMAL LOW (ref >60)
Glucose, Bld: 72 mg/dL (ref 70–99)
Glucose, Bld: 95 mg/dL (ref 70–99)
Phosphorus: 7.2 mg/dL — ABNORMAL HIGH (ref 2.5–4.6)
Phosphorus: 3.9 mg/dL (ref 2.5–4.6)
Potassium: 5 mmol/L (ref 3.5–5.1)
Potassium: 4.1 mmol/L (ref 3.5–5.1)
Sodium: 137 mmol/L (ref 135–145)
Sodium: 134 mmol/L — ABNORMAL LOW (ref 135–145)

## 2020-11-09 LAB — GLUCOSE, CAPILLARY
Glucose-Capillary: 58 mg/dL — ABNORMAL LOW (ref 70–99)
Glucose-Capillary: 62 mg/dL — ABNORMAL LOW (ref 70–99)
Glucose-Capillary: 63 mg/dL — ABNORMAL LOW (ref 70–99)
Glucose-Capillary: 72 mg/dL (ref 70–99)
Glucose-Capillary: 77 mg/dL (ref 70–99)
Glucose-Capillary: 81 mg/dL (ref 70–99)
Glucose-Capillary: 96 mg/dL (ref 70–99)
Glucose-Capillary: 96 mg/dL (ref 70–99)

## 2020-11-09 LAB — BPAM RBC
Blood Product Expiration Date: 202112252359
Blood Product Expiration Date: 202112302359
Blood Product Expiration Date: 202112312359
Blood Product Expiration Date: 202201042359
ISSUE DATE / TIME: 202111291657
ISSUE DATE / TIME: 202111301253
ISSUE DATE / TIME: 202112010902
ISSUE DATE / TIME: 202112011946
Unit Type and Rh: 5100
Unit Type and Rh: 5100
Unit Type and Rh: 5100
Unit Type and Rh: 5100

## 2020-11-09 LAB — TYPE AND SCREEN
ABO/RH(D): O POS
Antibody Screen: NEGATIVE
Unit division: 0
Unit division: 0
Unit division: 0
Unit division: 0

## 2020-11-09 LAB — CBC
HCT: 26.2 % — ABNORMAL LOW (ref 39.0–52.0)
HCT: 29 % — ABNORMAL LOW (ref 39.0–52.0)
Hemoglobin: 8.9 g/dL — ABNORMAL LOW (ref 13.0–17.0)
Hemoglobin: 9.4 g/dL — ABNORMAL LOW (ref 13.0–17.0)
MCH: 31.3 pg (ref 26.0–34.0)
MCH: 29.7 pg (ref 26.0–34.0)
MCHC: 34 g/dL (ref 30.0–36.0)
MCHC: 32.4 g/dL (ref 30.0–36.0)
MCV: 92.3 fL (ref 80.0–100.0)
MCV: 91.8 fL (ref 80.0–100.0)
Platelets: 186 10*3/uL (ref 150–400)
Platelets: 172 10*3/uL (ref 150–400)
RBC: 2.84 MIL/uL — ABNORMAL LOW (ref 4.22–5.81)
RBC: 3.16 MIL/uL — ABNORMAL LOW (ref 4.22–5.81)
RDW: 19.8 % — ABNORMAL HIGH (ref 11.5–15.5)
RDW: 18.9 % — ABNORMAL HIGH (ref 11.5–15.5)
WBC: 14 10*3/uL — ABNORMAL HIGH (ref 4.0–10.5)
WBC: 13.5 10*3/uL — ABNORMAL HIGH (ref 4.0–10.5)
nRBC: 0.1 % (ref 0.0–0.2)
nRBC: 0.2 % (ref 0.0–0.2)

## 2020-11-09 LAB — MAGNESIUM
Magnesium: 2.3 mg/dL (ref 1.7–2.4)
Magnesium: 2.7 mg/dL — ABNORMAL HIGH (ref 1.7–2.4)

## 2020-11-09 LAB — BASIC METABOLIC PANEL
Anion gap: 16 — ABNORMAL HIGH (ref 5–15)
BUN: 73 mg/dL — ABNORMAL HIGH (ref 6–20)
CO2: 17 mmol/L — ABNORMAL LOW (ref 22–32)
Calcium: 9.1 mg/dL (ref 8.9–10.3)
Chloride: 101 mmol/L (ref 98–111)
Creatinine, Ser: 5.65 mg/dL — ABNORMAL HIGH (ref 0.61–1.24)
GFR, Estimated: 11 mL/min — ABNORMAL LOW (ref >60)
Glucose, Bld: 176 mg/dL — ABNORMAL HIGH (ref 70–99)
Potassium: 5.2 mmol/L — ABNORMAL HIGH (ref 3.5–5.1)
Sodium: 134 mmol/L — ABNORMAL LOW (ref 135–145)

## 2020-11-09 LAB — PREPARE RBC (CROSSMATCH)

## 2020-11-09 SURGERY — EGD (ESOPHAGOGASTRODUODENOSCOPY)

## 2020-11-09 MED ORDER — DEXTROSE 50 % IV SOLN
INTRAVENOUS | Status: AC
  Administered 2020-11-09: 12.5 g
  Filled 2020-11-09: qty 50

## 2020-11-09 MED ORDER — FENTANYL CITRATE (PF) 100 MCG/2ML IJ SOLN
50.0000 ug | INTRAMUSCULAR | Status: AC | PRN
  Administered 2020-11-10 – 2020-11-11 (×3): 50 ug via INTRAVENOUS
  Filled 2020-11-09 (×3): qty 2

## 2020-11-09 MED ORDER — DEXTROSE 50 % IV SOLN
50.0000 mL | Freq: Once | INTRAVENOUS | Status: AC
  Administered 2020-11-09: 50 mL via INTRAVENOUS
  Filled 2020-11-09: qty 50

## 2020-11-09 MED ORDER — DIPHENHYDRAMINE HCL 50 MG/ML IJ SOLN
INTRAMUSCULAR | Status: AC
  Filled 2020-11-09: qty 1

## 2020-11-09 MED ORDER — FENTANYL CITRATE (PF) 100 MCG/2ML IJ SOLN
50.0000 ug | INTRAMUSCULAR | Status: DC | PRN
  Administered 2020-11-11 – 2020-11-12 (×3): 100 ug via INTRAVENOUS
  Administered 2020-11-14: 50 ug via INTRAVENOUS
  Filled 2020-11-09 (×7): qty 2

## 2020-11-09 MED ORDER — DEXMEDETOMIDINE HCL IN NACL 400 MCG/100ML IV SOLN
0.4000 ug/kg/h | INTRAVENOUS | Status: DC
  Administered 2020-11-09: 0.4 ug/kg/h via INTRAVENOUS
  Administered 2020-11-09 – 2020-11-10 (×2): 1 ug/kg/h via INTRAVENOUS
  Administered 2020-11-10: 0.7 ug/kg/h via INTRAVENOUS
  Administered 2020-11-10: 1 ug/kg/h via INTRAVENOUS
  Administered 2020-11-10: 0.8 ug/kg/h via INTRAVENOUS
  Administered 2020-11-10: 1.1 ug/kg/h via INTRAVENOUS
  Administered 2020-11-10: 0.8 ug/kg/h via INTRAVENOUS
  Administered 2020-11-11: 1 ug/kg/h via INTRAVENOUS
  Administered 2020-11-11: 0.4 ug/kg/h via INTRAVENOUS
  Administered 2020-11-12 (×2): 0.7 ug/kg/h via INTRAVENOUS
  Administered 2020-11-13: 0.6 ug/kg/h via INTRAVENOUS
  Administered 2020-11-13: 0.5 ug/kg/h via INTRAVENOUS
  Administered 2020-11-13: 0.7 ug/kg/h via INTRAVENOUS
  Administered 2020-11-14: 0.6 ug/kg/h via INTRAVENOUS
  Filled 2020-11-09 (×19): qty 100

## 2020-11-09 MED ORDER — DEXTROSE 50 % IV SOLN
INTRAVENOUS | Status: AC
  Administered 2020-11-09: 50 mL via INTRAVENOUS
  Filled 2020-11-09: qty 50

## 2020-11-09 MED ORDER — DEXTROSE 50 % IV SOLN: 1.0000 | Freq: Once | INTRAVENOUS | Status: AC

## 2020-11-09 MED ORDER — FENTANYL CITRATE (PF) 100 MCG/2ML IJ SOLN
INTRAMUSCULAR | Status: AC
  Filled 2020-11-09: qty 2

## 2020-11-09 MED ORDER — MIDAZOLAM HCL (PF) 5 MG/ML IJ SOLN
INTRAMUSCULAR | Status: AC
  Filled 2020-11-09: qty 2

## 2020-11-09 MED ORDER — SODIUM CHLORIDE 0.9% IV SOLUTION: Freq: Once | INTRAVENOUS | Status: AC

## 2020-11-09 NOTE — Progress Notes (Signed)
[facility] for Infectious Disease  Date of Admission:  10/28/2020      Total days of antibiotics 11  Cefepime 11/21 >>          ASSESSMENT: Travis [name] is a 59 y.o. male with ESRD, HTN, CAD admitted with STEMI and found to have MSSA bacteremia as well (febrile with leukocytosis at presentation). TEE without definitive evidence of vegetations, but severe MR and echodensities that could be c/w calcification in ESRD patient vs vegetation. MRI of head and spine negative for infection. CT scan c/w pneumonia and MSSA growing from sputum as well. AVF unremarkable under U/S and CT a/p unremarkable for infection. Complicated ICU stay with prolonged ventilation support, gastric bleeding, CRRT.   He did not tolerate iHD yesterday and now back on pressors and CRRT.  Low grade temp 100.2 F; now back on pressors and comfortably sedated.   Staph epi on recent blood cultures, while growing from both sites, were drawn from the same site as it appears in the computer making it more c/w contaminant. His wbc continues to improve and did prior to vancomycin dose.   PLAN: 1. Continue cefazolin  2. Follow pending micro 3. Continue to trend WBC / fever curve   Principal Problem:   Acute ST elevation myocardial infarction (STEMI) of anterolateral wall (HCC) Active Problems:   ESRD (end stage renal disease) (HCC)   UGIB (upper gastrointestinal bleed)   Essential hypertension   Hyperlipidemia with target LDL less than 70   3 V- CAD w/ ACS/STEMI: Culprit = 99% mLAD @ D1 (DES PCI jailing D1); 99% ost-AVGLCx, 80% calcified napkin ring prox RCA.    Presence of drug coated stent in LAD coronary artery: Resolute Onyx DES 2.75 mm x 18 mm (3.1 mm) at major D1 & SP1.   Acute combined systolic and diastolic heart failure (HCC)   Atrial fibrillation with RVR (HCC)   Bacteremia   Septic shock due to Staphylococcus aureus [facility])   Cardiac arrest ([place])   Pneumonia of right lower lobe due to  methicillin susceptible Staphylococcus aureus (MSSA) (HCC)   Severe mitral regurgitation   Acute myocardial infarction (HCC)   Acute ST elevation myocardial infarction (STEMI) involving left anterior descending (LAD) coronary artery (HCC)   Pressure injury of skin   . sodium chloride   Intravenous Once  . aspirin  81 mg Per Tube Daily  . atorvastatin  80 mg Per Tube q1800  . B-complex with vitamin C  1 tablet Per Tube Daily  . bisacodyl  10 mg Rectal BID  . chlorhexidine gluconate (MEDLINE KIT)  15 mL Mouth Rinse BID  . Chlorhexidine Gluconate Cloth  6 each Topical Q0600  . clonazepam  1 mg Per Tube BID  . docusate  100 mg Per Tube BID  . doxercalciferol  5 mcg Intravenous Q M,W,F  . insulin aspart  0-6 Units Subcutaneous Q4H  . lidocaine  1 patch Transdermal Q24H  . mouth rinse  15 mL Mouth Rinse 10 times per day  . metoCLOPramide (REGLAN) injection  10 mg Intravenous Q12H  . pantoprazole (PROTONIX) IV  40 mg Intravenous Q12H  . polyethylene glycol  17 g Per Tube Daily  . QUEtiapine  100 mg Per Tube BID  . senna  1 tablet Per Tube QHS  . sevelamer carbonate  0.8 g Per Tube TID  . sodium chloride flush  10-40 mL Intracatheter Q12H  . sodium chloride flush  3 mL  Intravenous Q12H  . Thrombi-Pad  1 each Topical Once    SUBJECTIVE: Intubated   Wife at the bedside with son on facetime - report he did not do well with iHD yesterday and was more agitated. Concerned about elevated WBC.    Review of Systems: Review of Systems  Unable to perform ROS: Intubated    Allergies  Allergen Reactions  . Ibuprofen Hives  . Lisinopril Swelling    PT states he is allergic to all prils; caused facial swelling  . Naproxen Hives and Other (See Comments)    Alleve causes patient to have hives    OBJECTIVE: Vitals:   11/09/20 1144 11/09/20 1200 11/09/20 1236 11/09/20 1251  BP:  (!) 91/49 (!) 91/49 (!) 91/49  Pulse:  94 98 95  Resp:  17 20 20   Temp: 100.2 F (37.9 C)  100 F (37.8 C)  100.2 F (37.9 C)  TempSrc: Core  Core   SpO2:  97%    Weight:      Height:       Body mass index is 24.72 kg/m.  Physical Exam Vitals and nursing note reviewed.  Constitutional:      Comments: Resting comfortably on sedation currently.   HENT:     Mouth/Throat:     Mouth: Mucous membranes are dry.  Eyes:     Pupils: Pupils are equal, round, and reactive to light.  Cardiovascular:     Rate and Rhythm: Normal rate and regular rhythm.     Pulses: Normal pulses.  Pulmonary:     Effort: Pulmonary effort is normal.     Breath sounds: Normal breath sounds. No rhonchi.  Abdominal:     General: Bowel sounds are normal. There is no distension.     Palpations: Abdomen is soft.     Comments: Coffee ground/brown appearing drainage from OGT  Skin:    General: Skin is warm and dry.     Lab Results Lab Results  Component Value Date   WBC 14.0 (H) 11/09/2020   HGB 8.9 (L) 11/09/2020   HCT 26.2 (L) 11/09/2020   MCV 92.3 11/09/2020   PLT 186 11/09/2020    Lab Results  Component Value Date   CREATININE 5.03 (H) 11/09/2020   BUN 64 (H) 11/09/2020   NA 137 11/09/2020   K 5.0 11/09/2020   CL 101 11/09/2020   CO2 20 (L) 11/09/2020    Lab Results  Component Value Date   ALT 5 11/08/2020   AST 53 (H) 11/08/2020   ALKPHOS 137 (H) 11/08/2020   BILITOT 1.9 (H) 11/08/2020     Microbiology: BCx 11/21 >> +MSSA 4/4 BCx 11/22 >> no growth, final  BCx 11/23 >> no growth, final BCx 11/24 >> no growth, final  BCx 11/29 >> MRSE 2 bottles, drawn from same sight BCx 12/01 >> no growth, pending   Janene Madeira, MSN, NP-C [place] for Infectious Disease [email or web address] Pager: [phone] Office: [phone] [place]: [phone]

## 2020-11-09 NOTE — Op Note (Signed)
[facility] Patient Name: Travis [name] Procedure Date : 11/09/2020 MRN: [id number] Attending MD: Clarene Essex , MD Date of Birth: 1961-07-09 CSN: [id number] Age: 59 Admit Type: Inpatient Procedure:                Upper GI endoscopy Indications:              Coffee-ground emesis and significant coffee-ground                            and NG tube Providers:                Clarene Essex, MD, Cletis Athens, Technician, Lesia Sago, Technician Referring MD:              Medicines:                Propofol---Drip per ICU nurse Complications:            No immediate complications. Estimated Blood Loss:     Estimated blood loss: none. Procedure:                Pre-Anesthesia Assessment:                           - Prior to the procedure, a History and Physical                            was performed, and patient medications and                            allergies were reviewed. The patient's tolerance of                            previous anesthesia was also reviewed. The risks                            and benefits of the procedure and the sedation                            options and risks were discussed with the patient.                            All questions were answered, and informed consent                            was obtained. Prior Anticoagulants: The patient has                            taken antiplatelet medication, last dose was day of                            procedure. ASA Grade Assessment: IV - A patient  with severe systemic disease that is a constant                            threat to life. After reviewing the risks and                            benefits, the patient was deemed in satisfactory                            condition to undergo the procedure.                           After obtaining informed consent, the endoscope was                            passed under direct vision.  Throughout the                            procedure, the patient's blood pressure, pulse, and                            oxygen saturations were monitored continuously. The                            GIF-H190 ([id number]) Olympus gastroscope was                            introduced through the mouth, and advanced to the                            third part of duodenum. The upper GI endoscopy was                            somewhat difficult due to excessive old blood and                            clots which clogged our endoscope requiring lots of                            washing and suctioning. The patient tolerated the                            procedure well. Scope In: Scope Out: Findings:      Moderately severe esophagitis with no bleeding was found.      Hematin (altered blood/coffee-ground-like material) was found in the       cardia, in the gastric fundus, in the gastric body and in the gastric       antrum. Fluid aspiration was performed.      Localized severe inflammation characterized by congestion (edema),       friability, linear erosions and serpentine ulcerations was found in the       duodenal bulb and C-loop compatible with ischemia.      The third portion of the duodenum was normal.      The  exam was otherwise without abnormality however there was some NG       trauma marks in the stomach and with washing and suctioning there was       some diffuse oozing of the mucosa as well. Impression:               - Moderately severe reflux and erosive esophagitis                            with no bleeding.                           - Hematin (altered blood/coffee-ground-like                            material) in the gastric antrum, in the cardia, in                            the gastric fundus and in the gastric body. Fluid                            aspiration performed.                           -Probable ischemic duodenitis.                           - Normal third  portion of the duodenum.                           - The examination was otherwise normal. Except for                            some NG suction marks and some oozing with washing                            and suctioning diffusely of the mucosa Recommendation:           - NPO today. I have discussed the case with the                            patient's wife and the ICU team and would leave NG                            tube out for a day or 2                           - Continue present medications. Continue Protonix                            drip will resume antiplatelet agent today but will                            leave restarting heparin to critical care and  cardiology- Return to GI clinic PRN.                           - Telephone GI clinic if symptomatic PRN. Procedure Code(s):        --- Professional ---                           [phone], Esophagogastroduodenoscopy, flexible,                            transoral; diagnostic, including collection of                            specimen(s) by brushing or washing, when performed                            (separate procedure) Diagnosis Code(s):        --- Professional ---                           K21.00, Gastro-esophageal reflux disease with                            esophagitis, without bleeding                           K20.80, Other esophagitis without bleeding                           K92.2, Gastrointestinal hemorrhage, unspecified                           K92.0, Hematemesis CPT copyright 2019 [employer]. All rights reserved. The codes documented in this report are preliminary and upon coder review may  be revised to meet current compliance requirements. Clarene Essex, MD 11/09/2020 3:57:42 PM This report has been signed electronically. Number of Addenda: 0

## 2020-11-09 NOTE — Consult Note (Signed)
Reason for Consult: Upper GI bleeding Referring Physician: ICU team  Travis [name] is an 59 y.o. male.  HPI: Patient seen and examined and case discussed with ICU team and hospital in our office computer chart reviewed and he is on an antiplatelet agent and his heparin was stopped but he has continued to have dark material in his gastric tube and has continued to decrease his hemoglobin and he did have a recent MI and stent as well as chronic renal failure and a recent CT was reviewed and he has no other complaints  Past Medical History:  Diagnosis Date  . Acute ST elevation myocardial infarction (STEMI) of anterolateral wall ([place]) 10/28/2020   Culprit lesion, 99% ulcerated mid LAD at D1 -> DES PCI  . Anemia of chronic disease 01/30/2015  . Coronary artery disease involving native coronary artery of native heart with unstable angina pectoris ([place]) 10/28/2020   Acute anterolateral STEMI: 3V CAD: Culprit Lesion = 99% mid LAD lesion (just after D1) - DES PCI; 99% ostial AV groove LCx; & 80% calcified napkin ring proximal RCA lesion with extensive calcification throughout the RCA. Successful PTCA and DES PCI of the LAD crossing D1 -resolute Onyx DES 2.75 mm x 18 mm postdilated to 3.1 mm. With PCI, there  . End-stage renal disease on hemodialysis ([place])   . GERD (gastroesophageal reflux disease)    takes Nexium  . Hyperlipidemia with target LDL less than 70 10/28/2020  . Hypertension   . Pneumonia    as a child  . Presence of drug coated stent in LAD coronary artery 10/28/2020   Proximal-mid LAD 99% ->0% -> Resolute Onyx DES 2.75 mm x 18 mm (3.1 mm)  . Sleep apnea    uses c-pap 2 yrs    Past Surgical History:  Procedure Laterality Date  . AV FISTULA PLACEMENT Left 12/22/2017   Procedure: REPAIR PSEUDOANEURYSM ARTERIOVENOUS (AV) FISTULA;  Surgeon: [name], MD;  Location: [place] [facility];  Service: Vascular;  Laterality: Left;  . BASCILIC VEIN TRANSPOSITION Left 01/05/2014   Procedure: LEFT 1ST  STAGE BASCILIC VEIN TRANSPOSITION;  Surgeon: Conrad Springtown, MD;  Location: [facility];  Service: Vascular;  Laterality: Left;  . BASCILIC VEIN TRANSPOSITION Left 07/26/2014   Procedure: Left Arm Brachial Vein Transposition Second Stage;  Surgeon: Conrad [place] Oaks, MD;  Location: [facility];  Service: Vascular;  Laterality: Left;  . COLONOSCOPY    . COLONOSCOPY N/A 02/02/2015   Procedure: COLONOSCOPY;  Surgeon: [name], MD;  Location: [facility] ENDOSCOPY;  Service: Endoscopy;  Laterality: N/A;  . CORONARY STENT INTERVENTION N/A 10/28/2020   Procedure: CORONARY STENT INTERVENTION;  Surgeon: Leonie Man, MD;  Location: [place] CV LAB;  Service: Cardiovascular;  Laterality: N/A;  . CORONARY/GRAFT ACUTE MI REVASCULARIZATION N/A 10/28/2020   Procedure: Coronary/Graft Acute MI Revascularization;  Surgeon: Leonie Man, MD;  Location: [place] CV LAB;  Service: Cardiovascular;  Laterality: N/A;  . ESOPHAGOGASTRODUODENOSCOPY Left 01/31/2015   Procedure: ESOPHAGOGASTRODUODENOSCOPY (EGD);  Surgeon: [name], MD;  Location[facility]COPY;  Service: Endoscopy;  Laterality: Left;  . EVALUATION UNDER ANESTHESIA WITH HEMORRHOIDECTOMY N/A 02/26/2018   Procedure: ANORECTAL EXAM UNDER ANESTHESIA  HEMORRHOIDECTOMY x 2  HEMORRHOIDAL LIGATION AND PEXY;  Surgeon: Stacie Boston, MD;  Location: [facility];  Service: General;  Laterality: N/A;  . GIVENS CAPSULE STUDY N/A 01/31/2015   Procedure: GIVENS CAPSULE STUDY;  Surgeon: [name], MD;  Location: [facility] ENDOSCOPY;  Service: Endoscopy;  Laterality: N/A;  . LEFT HEART CATH  AND CORONARY ANGIOGRAPHY N/A 10/28/2020   Procedure: LEFT HEART CATH AND CORONARY ANGIOGRAPHY;  Surgeon: Leonie Man, MD;  Location: [place] CV LAB;  Service: Cardiovascular;  Laterality: N/A;  . MOUTH SURGERY     teeth cleaning  . NEPHRECTOMY RADICAL Right 05/2015   [facility] Dr [name] for R renal mass    Family History  Problem Relation Age of Onset  . Hypertension Mother   . Hypertension  Father   . Hypertension Sister     Social History:  reports that he quit smoking about 7 years ago. His smoking use included cigars. He quit after 4.00 years of use. He has never used smokeless tobacco. He reports that he does not drink alcohol and does not use drugs.  Allergies:  Allergies  Allergen Reactions  . Ibuprofen Hives  . Lisinopril Swelling    PT states he is allergic to all prils; caused facial swelling  . Naproxen Hives and Other (See Comments)    Alleve causes patient to have hives    Medications: I have reviewed the patient's current medications.  Results for orders placed or performed during the hospital encounter of 10/28/20 (from the past 48 hour(s))  Glucose, capillary     Status: None   Collection Time: 11/07/20  4:11 PM  Result Value Ref Range   Glucose-Capillary 88 70 - 99 mg/dL    Comment: Glucose reference range applies only to samples taken after fasting for at least 8 hours.   Comment 1 Notify RN    Comment 2 Document in Chart   Heparin level (unfractionated)     Status: None   Collection Time: 11/07/20  4:13 PM  Result Value Ref Range   Heparin Unfractionated 0.60 0.30 - 0.70 IU/mL    Comment: (NOTE) If heparin results are below expected values, and patient dosage has  been confirmed, suggest follow up testing of antithrombin III levels. Performed at [facility], [place] [address], [place], [place] [address]   Glucose, capillary     Status: None   Collection Time: 11/07/20  8:56 PM  Result Value Ref Range   Glucose-Capillary 88 70 - 99 mg/dL    Comment: Glucose reference range applies only to samples taken after fasting for at least 8 hours.  Glucose, capillary     Status: None   Collection Time: 11/08/20 12:17 AM  Result Value Ref Range   Glucose-Capillary 85 70 - 99 mg/dL    Comment: Glucose reference range applies only to samples taken after fasting for at least 8 hours.  Glucose, capillary     Status: None   Collection Time: 11/08/20   3:40 AM  Result Value Ref Range   Glucose-Capillary 76 70 - 99 mg/dL    Comment: Glucose reference range applies only to samples taken after fasting for at least 8 hours.  Heparin level (unfractionated)     Status: None   Collection Time: 11/08/20  3:47 AM  Result Value Ref Range   Heparin Unfractionated 0.59 0.30 - 0.70 IU/mL    Comment: (NOTE) If heparin results are below expected values, and patient dosage has  been confirmed, suggest follow up testing of antithrombin III levels. Performed at [facility], [place] [address], [place] [address]   CBC     Status: Abnormal   Collection Time: 11/08/20  3:47 AM  Result Value Ref Range   WBC 19.8 (H) 4.0 - 10.5 K/uL   RBC 2.29 (L) 4.22 - 5.81  MIL/uL   Hemoglobin 7.1 (L) 13.0 - 17.0 g/dL   HCT 22.8 (L) 39 - 52 %   MCV 99.6 80.0 - 100.0 fL   MCH 31.0 26.0 - 34.0 pg   MCHC 31.1 30.0 - 36.0 g/dL   RDW 21.3 (H) 11.5 - 15.5 %   Platelets 192 150 - 400 K/uL   nRBC 0.3 (H) 0.0 - 0.2 %    Comment: Performed at [facility] [address], [place], [place] [address]  Comprehensive metabolic panel     Status: Abnormal   Collection Time: 11/08/20  3:47 AM  Result Value Ref Range   Sodium 134 (L) 135 - 145 mmol/L   Potassium 5.5 (H) 3.5 - 5.1 mmol/L   Chloride 101 98 - 111 mmol/L   CO2 16 (L) 22 - 32 mmol/L   Glucose, Bld 89 70 - 99 mg/dL    Comment: Glucose reference range applies only to samples taken after fasting for at least 8 hours.   BUN 112 (H) 6 - 20 mg/dL   Creatinine, Ser 7.43 (H) 0.61 - 1.24 mg/dL   Calcium 9.4 8.9 - 10.3 mg/dL   Total Protein 5.8 (L) 6.5 - 8.1 g/dL   Albumin 1.3 (L) 3.5 - 5.0 g/dL   AST 53 (H) 15 - 41 U/L   ALT 5 0 - 44 U/L   Alkaline Phosphatase 137 (H) 38 - 126 U/L   Total Bilirubin 1.9 (H) 0.3 - 1.2 mg/dL   GFR, Estimated 8 (L) >60 mL/min    Comment: (NOTE) Calculated using the CKD-EPI Creatinine Equation (2021)    Anion gap 17 (H) 5 - 15    Comment: Performed at [facility], [place] [address] [place], [place] [address]  Magnesium     Status: Abnormal   Collection Time: 11/08/20  3:47 AM  Result Value Ref Range   Magnesium 2.7 (H) 1.7 - 2.4 mg/dL    Comment: Performed at [facility] [address], [place], [place] [address]  Phosphorus     Status: Abnormal   Collection Time: 11/08/20  3:47 AM  Result Value Ref Range   Phosphorus 7.8 (H) 2.5 - 4.6 mg/dL    Comment: Performed at [facility] [address] [place], [place] [address]  Lactic acid, plasma     Status: None   Collection Time: 11/08/20  3:48 AM  Result Value Ref Range   Lactic Acid, Venous 0.5 0.5 - 1.9 mmol/L    Comment: Performed at [facility] [address], [place], [place] [address]  Glucose, capillary     Status: None   Collection Time: 11/08/20  8:04 AM  Result Value Ref Range   Glucose-Capillary 74 70 - 99 mg/dL    Comment: Glucose reference range applies only to samples taken after fasting for at least 8 hours.  Prepare RBC (crossmatch)     Status: None   Collection Time: 11/08/20  8:36 AM  Result Value Ref Range   Order Confirmation      ORDER PROCESSED BY BLOOD BANK Performed at [facility], [address] [place], [place] [address]   Glucose, capillary     Status: None   Collection Time: 11/08/20  1:56 PM  Result Value Ref Range   Glucose-Capillary 82 70 - 99 mg/dL    Comment: Glucose reference range applies only to samples taken after fasting for at least 8 hours.  Hemoglobin and hematocrit,  blood     Status: Abnormal   Collection Time: 11/08/20  2:06 PM  Result Value Ref Range   Hemoglobin 8.6 (L) 13.0 - 17.0 g/dL   HCT 26.5 (L) 39 - 52 %    Comment: Performed at [facility] [address] [place], [place] [address]  Glucose, capillary     Status: None   Collection Time: 11/08/20  4:51 PM  Result Value Ref Range   Glucose-Capillary 97 70 - 99 mg/dL    Comment: Glucose reference range applies only to samples taken after  fasting for at least 8 hours.  Heparin level (unfractionated)     Status: None   Collection Time: 11/08/20  5:06 PM  Result Value Ref Range   Heparin Unfractionated 0.54 0.30 - 0.70 IU/mL    Comment: (NOTE) If heparin results are below expected values, and patient dosage has  been confirmed, suggest follow up testing of antithrombin III levels. Performed at [facility], [place] [address], [place], [place] [address]   Renal function panel (daily at 1600)     Status: Abnormal   Collection Time: 11/08/20  5:06 PM  Result Value Ref Range   Sodium 135 135 - 145 mmol/L   Potassium 5.4 (H) 3.5 - 5.1 mmol/L   Chloride 99 98 - 111 mmol/L   CO2 16 (L) 22 - 32 mmol/L   Glucose, Bld 111 (H) 70 - 99 mg/dL    Comment: Glucose reference range applies only to samples taken after fasting for at least 8 hours.   BUN 94 (H) 6 - 20 mg/dL   Creatinine, Ser 6.94 (H) 0.61 - 1.24 mg/dL   Calcium 9.3 8.9 - 10.3 mg/dL   Phosphorus 8.9 (H) 2.5 - 4.6 mg/dL   Albumin 1.3 (L) 3.5 - 5.0 g/dL   GFR, Estimated 9 (L) >60 mL/min    Comment: (NOTE) Calculated using the CKD-EPI Creatinine Equation (2021)    Anion gap 20 (H) 5 - 15    Comment: Performed at [facility] [address] [place], [place] [address]  CBC     Status: Abnormal   Collection Time: 11/08/20  6:10 PM  Result Value Ref Range   WBC 24.0 (H) 4.0 - 10.5 K/uL   RBC 2.52 (L) 4.22 - 5.81 MIL/uL   Hemoglobin 7.7 (L) 13.0 - 17.0 g/dL   HCT 24.3 (L) 39 - 52 %   MCV 96.4 80.0 - 100.0 fL   MCH 30.6 26.0 - 34.0 pg   MCHC 31.7 30.0 - 36.0 g/dL   RDW 20.7 (H) 11.5 - 15.5 %   Platelets 207 150 - 400 K/uL   nRBC 0.2 0.0 - 0.2 %    Comment: Performed at [facility] [address], [place],  [address]  Type and screen [place]     Status: None (Preliminary result)   Collection Time: 11/08/20  6:50 PM  Result Value Ref Range   ABO/RH(D) O POS    Antibody Screen NEG    Sample Expiration 11/11/2020,2359     Unit Number [id number]    Blood Component Type RED CELLS,LR    Unit division 00    Status of Unit ISSUED,FINAL    Transfusion Status OK TO TRANSFUSE    Crossmatch Result Compatible    Unit Number [id number]    Blood Component Type RED CELLS,LR    Unit division 00    Status of Unit ISSUED  Transfusion Status OK TO TRANSFUSE    Crossmatch Result      Compatible Performed at [facility], [place] [address], [place], [place] [address]   Prepare RBC (crossmatch)     Status: None   Collection Time: 11/08/20  7:04 PM  Result Value Ref Range   Order Confirmation      ORDER PROCESSED BY BLOOD BANK Performed at [facility], [place] [address], [place], [place] [address]   Glucose, capillary     Status: Abnormal   Collection Time: 11/08/20  8:58 PM  Result Value Ref Range   Glucose-Capillary 117 (H) 70 - 99 mg/dL    Comment: Glucose reference range applies only to samples taken after fasting for at least 8 hours.  Hemoglobin and hematocrit, blood     Status: Abnormal   Collection Time: 11/08/20 11:42 PM  Result Value Ref Range   Hemoglobin 8.6 (L) 13.0 - 17.0 g/dL   HCT 26.6 (L) 39 - 52 %    Comment: Performed at [facility] [address], [place], [place] [address]  Basic metabolic panel     Status: Abnormal   Collection Time: 11/08/20 11:42 PM  Result Value Ref Range   Sodium 134 (L) 135 - 145 mmol/L   Potassium 5.2 (H) 3.5 - 5.1 mmol/L   Chloride 101 98 - 111 mmol/L   CO2 17 (L) 22 - 32 mmol/L   Glucose, Bld 176 (H) 70 - 99 mg/dL    Comment: Glucose reference range applies only to samples taken after fasting for at least 8 hours.   BUN 73 (H) 6 - 20 mg/dL   Creatinine, Ser 5.65 (H) 0.61 - 1.24 mg/dL   Calcium 9.1 8.9 - 10.3 mg/dL   GFR, Estimated 11 (L) >60 mL/min    Comment: (NOTE) Calculated using the CKD-EPI Creatinine Equation (2021)    Anion gap 16 (H) 5 - 15    Comment: Performed at [facility] [address], [place], [place] [address]   Magnesium     Status: None   Collection Time: 11/08/20 11:42 PM  Result Value Ref Range   Magnesium 2.3 1.7 - 2.4 mg/dL    Comment: Performed at [facility], [place] [address], [place], [place] [address]  Glucose, capillary     Status: Abnormal   Collection Time: 11/08/20 11:45 PM  Result Value Ref Range   Glucose-Capillary 161 (H) 70 - 99 mg/dL    Comment: Glucose reference range applies only to samples taken after fasting for at least 8 hours.  CBC     Status: Abnormal   Collection Time: 11/09/20  4:14 AM  Result Value Ref Range   WBC 14.0 (H) 4.0 - 10.5 K/uL   RBC 2.84 (L) 4.22 - 5.81 MIL/uL   Hemoglobin 8.9 (L) 13.0 - 17.0 g/dL   HCT 26.2 (L) 39 - 52 %   MCV 92.3 80.0 - 100.0 fL   MCH 31.3 26.0 - 34.0 pg   MCHC 34.0 30.0 - 36.0 g/dL   RDW 19.8 (H) 11.5 - 15.5 %   Platelets 186 150 - 400 K/uL   nRBC 0.1 0.0 - 0.2 %    Comment: Performed at [facility] [address], [place], [place] [address]  Magnesium     Status: Abnormal   Collection Time: 11/09/20  4:14 AM  Result Value Ref Range   Magnesium 2.7 (H) 1.7 - 2.4 mg/dL  Comment: Performed at [facility], [place] [address] [place], [place] [address]  Renal function panel     Status: Abnormal   Collection Time: 11/09/20  4:14 AM  Result Value Ref Range   Sodium 137 135 - 145 mmol/L   Potassium 5.0 3.5 - 5.1 mmol/L   Chloride 101 98 - 111 mmol/L   CO2 20 (L) 22 - 32 mmol/L   Glucose, Bld 72 70 - 99 mg/dL    Comment: Glucose reference range applies only to samples taken after fasting for at least 8 hours.   BUN 64 (H) 6 - 20 mg/dL   Creatinine, Ser 5.03 (H) 0.61 - 1.24 mg/dL   Calcium 9.9 8.9 - 10.3 mg/dL   Phosphorus 7.2 (H) 2.5 - 4.6 mg/dL   Albumin 1.3 (L) 3.5 - 5.0 g/dL   GFR, Estimated 13 (L) >60 mL/min    Comment: (NOTE) Calculated using the CKD-EPI Creatinine Equation (2021)    Anion gap 16 (H) 5 - 15    Comment: Performed at [facility] [address], [place], [place] [address]   Glucose, capillary     Status: Abnormal   Collection Time: 11/09/20  4:21 AM  Result Value Ref Range   Glucose-Capillary 63 (L) 70 - 99 mg/dL    Comment: Glucose reference range applies only to samples taken after fasting for at least 8 hours.  Glucose, capillary     Status: None   Collection Time: 11/09/20  4:39 AM  Result Value Ref Range   Glucose-Capillary 96 70 - 99 mg/dL    Comment: Glucose reference range applies only to samples taken after fasting for at least 8 hours.  Culture, blood (routine x 2)     Status: None (Preliminary result)   Collection Time: 11/09/20  5:48 AM   Specimen: BLOOD  Result Value Ref Range   Specimen Description BLOOD RIGHT ANTECUBITAL    Special Requests      BOTTLES DRAWN AEROBIC AND ANAEROBIC Blood Culture adequate volume   Culture      NO GROWTH <12 HOURS Performed at [facility], [address], [place], [place] [address]    Report Status PENDING   Culture, blood (routine x 2)     Status: None (Preliminary result)   Collection Time: 11/09/20  5:54 AM   Specimen: BLOOD  Result Value Ref Range   Specimen Description BLOOD BLOOD RIGHT HAND    Special Requests      BOTTLES DRAWN AEROBIC AND ANAEROBIC Blood Culture adequate volume   Culture      NO GROWTH <12 HOURS Performed at [facility], [place] [address], [place], [place] [address]    Report Status PENDING   Glucose, capillary     Status: None   Collection Time: 11/09/20  8:37 AM  Result Value Ref Range   Glucose-Capillary 72 70 - 99 mg/dL    Comment: Glucose reference range applies only to samples taken after fasting for at least 8 hours.  Glucose, capillary     Status: Abnormal   Collection Time: 11/09/20 11:42 AM  Result Value Ref Range   Glucose-Capillary 62 (L) 70 - 99 mg/dL    Comment: Glucose reference range applies only to samples taken after fasting for at least 8 hours.  Prepare RBC (crossmatch)     Status: None   Collection Time: 11/09/20 12:18 PM  Result Value  Ref Range   Order Confirmation      ORDER  PROCESSED BY BLOOD BANK Performed at [facility], [place] [address], [place], [place] [address]     DG Abd 1 View  Result Date: 11/09/2020 CLINICAL DATA:  Ileus. EXAM: ABDOMEN - 1 VIEW COMPARISON:  November 08, 2020. FINDINGS: The bowel gas pattern is normal. Distal tip of nasogastric tube is seen in expected position of distal stomach. Feeding tube is seen looped within the stomach with distal tip in the proximal stomach. Gastric distention noted on prior exam has resolved. Residual contrast and stool is noted throughout the colon. No radio-opaque calculi or other significant radiographic abnormality are seen. IMPRESSION: No evidence of bowel obstruction or ileus. Feeding tube tip is seen in proximal stomach. Gastric distention noted on prior exam has resolved. Electronically Signed   By: Marijo Conception M.D.   On: 11/09/2020 11:12   DG CHEST PORT 1 VIEW  Result Date: 11/08/2020 CLINICAL DATA:  Hypoxia EXAM: PORTABLE CHEST 1 VIEW COMPARISON:  November 06, 2020 FINDINGS: Endotracheal tube tip is 5.1 cm above the carina. Nasogastric tube tip and side port are below the diaphragm. Left subclavian catheter tip is in the left innominate vein near the junction with the superior vena cava. Right subclavian catheter tip is in the right atrium slightly beyond the cavoatrial junction. No pneumothorax. There is a small right pleural effusion. There is ill-defined airspace opacity in each lower lung region, primarily due to atelectasis with potential superimposed bibasilar pneumonia. There is cardiomegaly with pulmonary venous hypertension. No adenopathy. Evidence of old healed right clavicle fracture. There is aortic atherosclerosis. IMPRESSION: Tube and catheter positions as described without pneumothorax. Small right pleural effusion. Ill-defined opacity in the lung bases, slightly increased, likely due to combination of atelectasis and potential bibasilar pneumonia.  Stable cardiomegaly with a degree of pulmonary vascular congestion. Aortic Atherosclerosis (ICD10-I70.0). Electronically Signed   By: Lowella Grip III M.D.   On: 11/08/2020 08:15   DG Abd Portable 1V  Result Date: 11/08/2020 CLINICAL DATA:  Feeding tube placement and orogastric tube placement EXAM: PORTABLE ABDOMEN - 1 VIEW COMPARISON:  Portable exam 1217 hours compared to 11/07/2020 FINDINGS: Central venous catheter tip projects over RIGHT atrium. Feeding tube coiled in stomach. Nasogastric tube tip in stomach. Gaseous distention of stomach new since prior exam. Scattered residual contrast throughout colon. Rounded calcification projects over LEFT kidney corresponding to a calcified cyst on a prior CT. Osseous structures unremarkable. IMPRESSION: Nasogastric tube and coiled feeding tube both located within a stomach distended by gas. Electronically Signed   By: Lavonia Dana M.D.   On: 11/08/2020 12:34   US Abdomen Limited RUQ (LIVER/GB)  Result Date: 11/07/2020 CLINICAL DATA:  Nausea vomiting for several hours EXAM: ULTRASOUND ABDOMEN LIMITED RIGHT UPPER QUADRANT COMPARISON:  CT from earlier in the same day. FINDINGS: Gallbladder: Gallbladder is well distended with evidence of gallbladder sludge and multiple gallbladder polyps. No stones are seen. No wall thickening or pericholecystic fluid is noted. Common bile duct: Diameter: 6.6 mm. Liver: The hepatic echogenicity is within normal limits with the exception of a focal area of increased echogenicity measuring 2.3 cm in greatest dimension. This is consistent with a small hemangioma. This is also stable from recent MRI examination. Portal vein is patent on color Doppler imaging with normal direction of blood flow towards the liver. Other: None. IMPRESSION: Gallbladder sludge and gallbladder polyps without complicating factors. Changes consistent with hepatic hemangioma. Electronically Signed   By: Inez Catalina M.D.   On: 11/07/2020 19:47    Review  of  Systems negative except above Blood pressure (!) 91/49, pulse 95, temperature 100.2 F (37.9 C), resp. rate 20, height 5\' 11"  (1.803 m), weight 80.4 kg, SpO2 97 %. Physical Exam Low-grade temp low pressure physical exam please see preassessment evaluation labs reviewed Assessment/Plan: Upper GI bleeding and patient on blood thinners Plan: With the wife's permission and encouragement will proceed with endoscopy at the bedside and I have asked the PA to check with cardiology to see if we can turn off the antiplatelet medicine for short time with further work-up and plans pending endoscopic findings  Cindy Fullman E 11/09/2020, 3:00 PM

## 2020-11-09 NOTE — Progress Notes (Signed)
Hypoglycemia note:  0425- CBG 63  1/2amp of D 50 given   0440- CBG 96   1 unit of insulin was given per order at 2349, For CBG of 161. Pt may need adjustment to SS.

## 2020-11-09 NOTE — Progress Notes (Addendum)
[facility] Progress Note   Subjective: Intubated, wife at bedside. Unable to tolerate IDH 11/09/2020. SBP down to 40s. Resumed CRRT, running even. HGB down to 7.7 S/P 1 unit of PRBCs 12/01. Pressors have been weaned.     Objective Vitals:   11/09/20 0755 11/09/20 0756 11/09/20 0800 11/09/20 0835  BP: 114/73     Pulse: 87     Resp: 18     Temp:   (!) 100.4 F (38 C) (!) 100.4 F (38 C)  TempSrc:   Core Core  SpO2: 100% 100%    Weight:      Height:       Physical Exam General:Critically ill male intubated, sedated on vent Heart:S1,S2 No M/R/G Lungs:Bilateral breath sounds, symmetrical chest excursions. Coarse breath sounds, decreased in bases. Abdomen:No BS. OG with red-brown drainage. Extremities: No LE edema.BLE heel protectors.  Dialysis Access:L AVF + T/B Temp cath in place.   Additional Objective Labs: Basic Metabolic Panel: Recent Labs  Lab 11/08/20 0347 11/08/20 0347 11/08/20 1706 11/08/20 2342 11/09/20 0414  NA 134*   < > 135 134* 137  K 5.5*   < > 5.4* 5.2* 5.0  CL 101   < > 99 101 101  CO2 16*   < > 16* 17* 20*  GLUCOSE 89   < > 111* 176* 72  BUN 112*   < > 94* 73* 64*  CREATININE 7.43*   < > 6.94* 5.65* 5.03*  CALCIUM 9.4   < > 9.3 9.1 9.9  PHOS 7.8*  --  8.9*  --  7.2*   < > = values in this interval not displayed.   Liver Function Tests: Recent Labs  Lab 11/08/20 0347 11/08/20 1706 11/09/20 0414  AST 53*  --   --   ALT 5  --   --   ALKPHOS 137*  --   --   BILITOT 1.9*  --   --   PROT 5.8*  --   --   ALBUMIN 1.3* 1.3* 1.3*   No results for input(s): LIPASE, AMYLASE in the last 168 hours. CBC: Recent Labs  Lab 11/06/20 0205 11/06/20 1041 11/07/20 0242 11/07/20 0540 11/08/20 0347 11/08/20 1406 11/08/20 1810 11/08/20 2342 11/09/20 0414  WBC 38.5*  --  28.4*   < > 19.8*  --  24.0*  --  14.0*  HGB 7.0*   < > 7.1*   < > 7.1*   < > 7.7* 8.6* 8.9*  HCT 22.2*   < > 23.2*   < > 22.8*   < > 24.3* 26.6* 26.2*   MCV 100.0  --  102.2*  --  99.6  --  96.4  --  92.3  PLT 290  --  246   < > 192  --  207  --  186   < > = values in this interval not displayed.   Blood Culture    Component Value Date/Time   SDES BLOOD BLOOD RIGHT HAND 11/09/2020 0554   SPECREQUEST  11/09/2020 0554    BOTTLES DRAWN AEROBIC AND ANAEROBIC Blood Culture adequate volume   CULT  11/09/2020 0554    NO GROWTH <12 HOURS Performed at [facility] [address] [place], [place] [address]    REPTSTATUS PENDING 11/09/2020 0554    Medications: .  prismasol BGK 4/2.5 400 mL/hr at 11/08/20 1700  .  prismasol BGK 4/2.5 200 mL/hr at 11/08/20 1700  . sodium chloride 10 mL/hr at 11/09/20 0800  .  sodium chloride Stopped (10/28/20 0954)  . sodium chloride Stopped (11/04/20 0016)  . sodium chloride    . sodium chloride    . amiodarone 30 mg/hr (11/09/20 0800)  . cangrelor 50 mg in NS 250 mL 0.75 mcg/kg/min (11/09/20 0800)  .  ceFAZolin (ANCEF) IV Stopped (11/08/20 2159)  . epinephrine Stopped (11/09/20 0304)  . feeding supplement (VITAL AF 1.2 CAL) Stopped (11/07/20 0700)  . HYDROmorphone 2.5 mg/hr (11/09/20 0800)  . norepinephrine (LEVOPHED) Adult infusion Stopped (11/09/20 0323)  . prismasol BGK 4/2.5 1,800 mL/hr at 11/09/20 0208  . propofol (DIPRIVAN) infusion 5 mcg/kg/min (11/08/20 1317)  . vasopressin Stopped (11/09/20 0257)   . sodium chloride   Intravenous Once  . aspirin  81 mg Per Tube Daily  . atorvastatin  80 mg Per Tube q1800  . B-complex with vitamin C  1 tablet Per Tube Daily  . bisacodyl  10 mg Rectal BID  . chlorhexidine gluconate (MEDLINE KIT)  15 mL Mouth Rinse BID  . Chlorhexidine Gluconate Cloth  6 each Topical Q0600  . clonazepam  1 mg Per Tube BID  . docusate  100 mg Per Tube BID  . doxercalciferol  5 mcg Intravenous Q M,W,F  . feeding supplement (PROSource TF)  45 mL Per Tube QID  . insulin aspart  0-6 Units Subcutaneous Q4H  . lidocaine  1 patch Transdermal Q24H  . mouth rinse  15 mL  Mouth Rinse 10 times per day  . metoCLOPramide (REGLAN) injection  10 mg Intravenous Q12H  . pantoprazole (PROTONIX) IV  40 mg Intravenous Q12H  . polyethylene glycol  17 g Per Tube Daily  . QUEtiapine  100 mg Per Tube BID  . senna  1 tablet Per Tube QHS  . sevelamer carbonate  0.8 g Per Tube TID  . sodium chloride flush  10-40 mL Intracatheter Q12H  . sodium chloride flush  3 mL Intravenous Q12H  . Thrombi-Pad  1 each Topical Once     OP HD:AF MWF 4h 450/800 81.5kg 2/2.25 bath RUE AVF  ---Hep 5000units IV initial bolus+ 1000 units IVmidrun - hect 5 ug tiw  - mircera 50 ugIVq 4 weeks, last 11/15 (due 11/29)    Assessment/ Plan: 1. ESRD - usual HD MWF. ^^LVEDP per initial LHC.Started CRRT 11/24. Stopped 11/29 resumed 11/08/2020. SCr 5.03 BUN 64 this AM. K+ 5.0, running even, no heparin.  2. Acute STEMI- SP LAD PCI11/20 am, has otherdisease inLCx/ RCA.Transitioned to brilinta from cangrelor. Per cardiology. 3. MSSA cavitary PNA / bacteremia-intubated 11/23. Per PCCM. Respiratory culture 11/23 with MSSA. Blood cx +11/21.CT abd/ pelvis negative 11/23.F/U bcx's negative.Dialysis duplex 11/22 without abscessandUS of AVF wasnegative 11/27. Plan for 6 weeks of IV cefazolin per pharmacy. ID following. Less agitated on vent today  4. PEA arrest: occurred in setting of agitation/ sedation.Brief CPR. 5. Vomiting- on 11/30, OG in place. 1.5 liters emesis removed.CT Ab/Pelvis with contrast was unrevealing. Per primary. 6. Volume-appears euvolemic by exam. I & O+[id number]. Just got off pressors. Continue to run even.  No excess volume by exam, CXR with mild vascular congestion, sm R pleural effuson. .   7. Shock:off levo/vasopressin at present.  8. Anemia ckd - HGB 7.1 12/01 S/P 1 unit PRBCs. HGB 8s today.  9. MBD ckd -Orally intubated, binders on hold. 10. Afib:on amio and hep gtt, s/p DCCV.  10. Severe MR:per cardiology.   Rita H. Brown NP-C 11/09/2020,  8:56 AM  [facility] [phone]  Pt seen, examined and agree w  assess/plan as above with additions as indicated.  [facility] 11/09/2020, 10:01 AM

## 2020-11-09 NOTE — Progress Notes (Signed)
NAME:  Travis [name], MRN:  [id number], DOB:  May 19, 1961, LOS: 12 ADMISSION DATE:  10/28/2020, CONSULTATION DATE:  10/29/20 REFERRING MD:  Cardiology - [name], CHIEF COMPLAINT:  Hypotension, gram positive cocci on culture, AMS  Brief History   Mr. Rokosz is a 59 year old man with a history of HTN, ESRD on HD MWF, RUE AV fistula, hx of smoking, admitted 10/28/20 with fevers, myalgias for several days, acute chest pain.    History of present illness   On Admission, found to have STEMI, multivessel disease. Pulm edema with mild hypoxemia.  S/Post PCI to the LAD on 10/29/19. Planning for staged intervention to the left circumflex and possibly right coronary artery.  EF 34-28%, diastolic dysfunction, LVEDP 35. Underwent HD on 11/20 with 4 L volume removed.  Saturation improved after dialysis (100% on RA) Fevers noted 11/20. Blood cultures grew MSSA, noted this evening. .    This evening developed Afib with RVR 180s. Adenosine given, without improvement  Amiodarone infusion started per cardiology, 150mg  bolus, then drip.  Lopressor 5mg IV and 500ccNS given. Started on Phenylephrine, was on 2108mcg on my arrival.   Cardioverted emergently at 120J for ongoing afib with hypotension (60/40) and AMS.  Converted to sinus.  Remained moderately hypotensive (MAP 60) on phenylephrine.   Patient was given one dose zosyn, switched to ancef once cultures grew back MSSA.   Cardiac stress tests annually, last done 11/16/19.    Past Medical History  HTN ESRD, HD MWF  Significant Hospital Events   Cardioversion 11/21 Cardiac cath stent to LAD 11/20  Consults:  PCCM  Nephrology ID  Procedures:  Central line 11/21 >> 11/29 Arterial line 11/21 Repeat DCCV 11/23 11/23 ETT HD line 11/24 Arterial line 11/24 L [place] CVC 11/30>>   Significant Diagnostic Tests:  11/24 TEE>>Left ventricular ejection fraction, by estimation, is 35 to 40%. The  left ventricle has moderately decreased function.Moderate to  severe mitral regurgitation. 11/23 CT Chest>>Extensive multifocal nodular and patchy airspace disease in both lungs with a slight peripheral predominance in the upper lungs. 3.1 cm nodular consolidative opacity in the right upper lobe is cavitated. Imaging features likely related to multifocal pneumonia. Septic emboli and metastatic disease considered less likely but not excluded. 11/22 LUE Vas Upper Extremity Doppler>> Arteriovenous fistula-Aneurysmal dilatation noted. 11/24 CT abdomen - no retroperitoneal hematoma 11/29 MR Brain>> No evidence acute intracranial abnormality, sinisitis, mastoid effusions 11/29>> MR Cervical Spine>> Given the provided history of bacteremia, facet joint septic arthritis is difficult to definitively exclude, but the lack of any surrounding marrow edema argues against this Cervical spondylosis  11/29 MR Thoracic Spine No significant marrow edema in thoracic spine , Mild thoracic spondylosis hypointense marrow signal throughout the thoracic spine, likely related to the patient's end-stage renal disease. multifocal airspace disease and cavitary pulmonary lesions. Small right pleural effusion. 11/29 MR Lumbar Spine:As noted above and Suspected cholecystolithiasis  Micro Data:  11/21 BCx2>>Staph aureus > MSSA by BCID 11/24 BCx2>> 11/30 BC x 2>> staph epi>>  Antimicrobials:  Zosyn 11/21 x 1 Ancef 11/21 ->    Had an episode of GIB last night, transfused one unit PRBC's and stable overnight.  Off pressors overnight  Interim history/subjective:  Objective   Blood pressure 114/73, pulse 87, temperature 97.7 F (36.5 C), temperature source Oral, resp. rate 18, height 5\' 11"  (1.803 m), weight 80.4 kg, SpO2 100 %. CVP:  [7 mmHg-8 mmHg] 8 mmHg  Vent Mode: PRVC FiO2 (%):  [40 %] 40 % Set Rate:  [  16 bmp] 16 bmp Vt Set:  [600 mL] 600 mL PEEP:  [5 cmH20] 5 cmH20 Plateau Pressure:  [11 cmH20-17 cmH20] 11 cmH20   Intake/Output Summary (Last 24 hours) at 11/09/2020  0831 Last data filed at 11/09/2020 0700 Gross per 24 hour  Intake 3512.5 ml  Output 3476 ml  Net 36.5 ml   Filed Weights   11/08/20 1145 11/08/20 1330 11/09/20 0500  Weight: 80.6 kg 80.5 kg 80.4 kg    General:  Critically ill male M, intubated and sedated HEENT: MM pink/moist, ETT in place, minimal dark OG tube output today Neuro: examined on Dilaudid, pupils equal and reactive, when sedation weaned pt agitated and not following commands CV: s1s2 rrr, no m/r/g, PULM:  Mechanical breath sounds throughout, , on full vent support, no wheezing or rhonchi, diminished per bases, not able to wean today secondary to MS GI: Very Tender to touch, slightly distended, decreased OG output today, minimal BS Extremities: warm/dry, trace edema  Skin: no rashes or lesions  Resolved Hospital Problem list     Assessment & Plan:   Anterior STEMI, CAD, acute CHF exacerbation.  S/P PCI-DES pf LAD Cardiology primary, on Heparin, brillinta, asa.   P: -appreciate Cardiology management  -multi-vessel disease that will  eventually need a return to cath lab for staged PCI  -long term plan Warfarin/Plavix, currently on IV Cangrelor, heparin gtt held yesterday secondary to dark heme positive OG tube output -Volume overload managed with CRRT, briefly tried iHD yesterday and BP dropped precipitously, so  Transitioned back to CRRT    Afib now cardioverted to sinus rhythm on amiodarone following DCCV P: -Continue IV amiodarone -Monitor, weaning  Levophed in favor of Vasopressin, maintain MAP >65    Severe sepsis with septic shock due to MSSA bacteremia requiring titration of vasopressors, bacteremia likely from HD access.  No evidence of fistula or abscess.    Cavitations on CT chest, concern for septic emboli.  No clear vegetation on TEE Blood cultures 11/30 with staph epi  P: -ID felt that staph epi was likely contaminant from cultures possibly drawn from the same site.  Repeat cultures -WBC improving   -ID following, continue renally dosed Cefazolin, will need 6 week course -Repeat blood cultures in AM -maintain pressors to keep MAP greater than 65    Acute hypoxic respiratory failure due to MSSA pneumonia with cavitation. Agitation precluded weaning trial today P: -continue Ancef  -Chest physiotherapy - Mental status precludes extubation at this time --Maintain full vent support with SAT/SBT as tolerated -titrate Vent setting to maintain SpO2 greater than or equal to 90%. -HOB elevated 30 degrees. -Plateau pressures less than 30 cm H20.  -Follow chest x-ray, ABG prn.   -Bronchial hygiene and RT/bronchodilator protocol.   Constipation and Ileus Abdominal work-up negative for obstruction yesterday, GB with sludge but no cholecystitis  P: -SMOG enema with >1L stool out yesterday -continue stool softener from above and below   Acute toxic metabolic encephalopathy with agitation requiring titration of IV sedatives.  Improving, still requiring Dilaudid and prn Benzos  P: -MRI brain results noted above -Continue as needed lorazepam - Dilaudid at 1 mg - prn's as needed for pain management   GIB with ABLA Coffee ground material initially started 11/28, initially thought possibly secondary to PEG placement/manipulation.  Recurrence 12/1 and 12/2 P: -hold heparin, output had stopped this morning, however resumed later with BP drop.  Resume Levophed and transfuse additional 1 unit PRBC's and repeat CBC  - Trend CBC ,  down from 9.5 on admission transfuse 1 unit PRBC's - Start protonix 40mg  bid -continue holding heparin, protonix 40mg bid and will as GI to see  -make NPO    ESRD:  -Continue CRRT -Nephrology following - Trend BMET   Best practice:  Diet: NPO Pain/Anxiety/Delirium protocol (if indicated): hydromorphone infusion to RASS -1, have added lorazepam IV as needed to minimize propofol use.  On enteral Seroquel VAP protocol (if indicated): Bundle in place DVT  prophylaxis: on heparin gtt GI prophylaxis: protonix Glucose control: Phase 1 glycemic control Mobility: Bedrest Code Status: Full  Family Communication: Spoke with wife  at bedside 12/2 Disposition: ICU  Labs   CBC: Recent Labs  Lab 11/06/20 0205 11/06/20 1041 11/07/20 0242 11/07/20 0540 11/08/20 0347 11/08/20 1406 11/08/20 1810 11/08/20 2342 11/09/20 0414  WBC 38.5*  --  28.4*  --  19.8*  --  24.0*  --  14.0*  HGB 7.0*   < > 7.1*   < > 7.1* 8.6* 7.7* 8.6* 8.9*  HCT 22.2*   < > 23.2*   < > 22.8* 26.5* 24.3* 26.6* 26.2*  MCV 100.0  --  102.2*  --  99.6  --  96.4  --  92.3  PLT 290  --  246  --  192  --  207  --  186   < > = values in this interval not displayed.    Basic Metabolic Panel: Recent Labs  Lab 11/06/20 0205 11/06/20 0205 11/07/20 0241 11/07/20 0242 11/07/20 0540 11/07/20 1132 11/08/20 0347 11/08/20 1706 11/08/20 2342 11/09/20 0414  NA 135   < > 134*  --    < > 133* 134* 135 134* 137  K 4.3   < > 4.7  --    < > 5.1 5.5* 5.4* 5.2* 5.0  CL 98   < > 98  --    < > 98 101 99 101 101  CO2 22   < > 21*  --    < > 19* 16* 16* 17* 20*  GLUCOSE 162*   < > 144*  --    < > 97 89 111* 176* 72  BUN 52*   < > 80*  --    < > 96* 112* 94* 73* 64*  CREATININE 2.87*   < > 4.81*  --    < > 5.78* 7.43* 6.94* 5.65* 5.03*  CALCIUM 9.1   < > 9.4  --    < > 9.2 9.4 9.3 9.1 9.9  MG 2.7*  --   --  2.8*  --   --  2.7*  --  2.3 2.7*  PHOS 3.8  --  5.2*  --   --   --  7.8* 8.9*  --  7.2*   < > = values in this interval not displayed.   GFR: Estimated Creatinine Clearance: 17 mL/min (A) (by C-G formula based on SCr of 5.03 mg/dL (H)). Recent Labs  Lab 11/07/20 0242 11/08/20 0347 11/08/20 0348 11/08/20 1810 11/09/20 0414  WBC 28.4* 19.8*  --  24.0* 14.0*  LATICACIDVEN  --   --  0.5  --   --     Liver Function Tests: Recent Labs  Lab 11/06/20 0205 11/07/20 0241 11/08/20 0347 11/08/20 1706 11/09/20 0414  AST  --   --  53*  --   --   ALT  --   --  5  --   --    ALKPHOS  --   --  137*  --   --  BILITOT  --   --  1.9*  --   --   PROT  --   --  5.8*  --   --   ALBUMIN 1.5* 1.4* 1.3* 1.3* 1.3*   No results for input(s): LIPASE, AMYLASE in the last 168 hours. No results for input(s): AMMONIA in the last 168 hours.  ABG    Component Value Date/Time   PHART 7.410 11/07/2020 0631   PCO2ART 35.2 11/07/2020 0631   PO2ART 58 (L) 11/07/2020 0631   HCO3 22.3 11/07/2020 0631   TCO2 23 11/07/2020 0631   ACIDBASEDEF 2.0 11/07/2020 0631   O2SAT 90.0 11/07/2020 0631     Coagulation Profile: No results for input(s): INR, PROTIME in the last 168 hours.  Cardiac Enzymes: No results for input(s): CKTOTAL, CKMB, CKMBINDEX, TROPONINI in the last 168 hours.  HbA1C: Hgb A1c MFr Bld  Date/Time Value Ref Range Status  10/28/2020 06:04 AM 4.3 (L) 4.8 - 5.6 % Final    Comment:    (NOTE) Pre diabetes:          5.7%-6.4%  Diabetes:              >6.4%  Glycemic control for   <7.0% adults with diabetes   01/30/2015 09:09 PM 5.1 4.8 - 5.6 % Final    Comment:    (NOTE)         Pre-diabetes: 5.7 - 6.4         Diabetes: >6.4         Glycemic control for adults with diabetes: <7.0     CBG: Recent Labs  Lab 11/08/20 1651 11/08/20 2058 11/08/20 2345 11/09/20 0421 11/09/20 0439  GLUCAP 97 117* 161* 63* 96    Critical care time: 35 minutes    CRITICAL CARE Performed by: Otilio Carpen Cornesha Radziewicz PA   Total critical care time: 35 minutes  Critical care time was exclusive of separately billable procedures and treating other patients.  Critical care was necessary to treat or prevent imminent or life-threatening deterioration.  Critical care was time spent personally by me on the following activities: development of treatment plan with patient and/or surrogate as well as nursing, discussions with consultants, evaluation of patient's response to treatment, examination of patient, obtaining history from patient or surrogate, ordering and performing  treatments and interventions, ordering and review of laboratory studies, ordering and review of radiographic studies, pulse oximetry and re-evaluation of patient's condition.   Otilio Carpen Yedidya Duddy, PA-C [facility]  Pager# [phone], if no answer [phone]

## 2020-11-09 NOTE — Progress Notes (Signed)
1600hrs:  EGD done by Dr. Watt Climes OGT and core track was pulled out pre procedure. Propofol was started during EGD procedure then turned off post procedure  Findings: Esophagitis with no active bleeding and probable ischemic duodenitis on normal third portion of duodenum  Post procedure NPO for today and leave OGT off for 1-2 days  as per GI team

## 2020-11-09 NOTE — Progress Notes (Signed)
Overnight Progress note:  Pt weaned off Epi, Levo and Vaso drip as pressure and CRRT tolerated. Pt off all pressors by 0300.  Pt kept even on CRRT, +51ml this AM for 24 hours. BS @ 150 Pre @ 400 ml/hr Dialysate 1800 ml/hr Post 200 ml/hr  Bleeding noting to have diminished after stopping Heparin Drip and receiving 1 unit of PRBC at start of shift.  Pt responds to pain & voice. Does not follow commands. Appears to get upset and agitated with cares and repositioning.  Current Drips:  Amio @ 30 mg/hr Cangrolar @ .75 mcg/kg/min Dilaudid at 2.3 mg/hr  Pt received several Ativan and Versed pushes for agitation overnight. See EMAR.   Pt's wife at bedside overnight due to patients critical status. At start of night shift pt maxxed on 3 pressors, low HGB, receiving blood.

## 2020-11-09 NOTE — Progress Notes (Signed)
Bathing/turning patient at this time. Pressors paused at this time due to elevated BP

## 2020-11-09 NOTE — Progress Notes (Addendum)
PCCM interval progress note:  Pt had increased dark blood from OGT this afternoon with hypotension and had to resume Levophed.  Additional unit PRBC's ordered and post-transfusion Hgb 8.9.  [facility] consulted, appreciate Dr. Julieta Bellini assistance very much.    Upper Endoscopy: - Moderately severe reflux and erosive esophagitis with no active bleeding  - Hematin (altered blood/coffee-ground-like material) in the gastric antrum, in the cardia, in the gastric fundus and in the gastric body. Fluid aspiration performed. -Probable ischemic duodenitis. - Normal third portion of the duodenum. - The examination was otherwise normal. Except for some NG suction marks and some oozing with washing and suctioning diffusely of the mucosa - NPO today. I have discussed the case with the patient's wife and the ICU team and would leave NG tube out for a day or 2 - Continue present medications. Continue Protonix drip will resume antiplatelet agent today but will leave restarting heparin to critical care and cardiology - Return to GI clinic PRN. - Telephone GI clinic if symptomatic PRN.   Pt agitated throughout the day, change Dilaudid to Precedex with prn Fentanyl.  Cangrelor resumed post-procedure.  Continue to hold heparin for now.   Otilio Carpen Taffy Delconte, PA-C [place]  Pager# [phone], if no answer [phone]

## 2020-11-09 NOTE — Progress Notes (Addendum)
Progress Note  Patient Name: Travis [name] Date of Encounter: 11/09/2020  [facility] Cardiologist: Glenetta Hew, MD   Subjective   Adjusting pressors.  Inpatient Medications    Scheduled Meds: . sodium chloride   Intravenous Once  . aspirin  81 mg Per Tube Daily  . atorvastatin  80 mg Per Tube q1800  . B-complex with vitamin C  1 tablet Per Tube Daily  . bisacodyl  10 mg Rectal BID  . chlorhexidine gluconate (MEDLINE KIT)  15 mL Mouth Rinse BID  . Chlorhexidine Gluconate Cloth  6 each Topical Q0600  . clonazepam  1 mg Per Tube BID  . docusate  100 mg Per Tube BID  . doxercalciferol  5 mcg Intravenous Q M,W,F  . feeding supplement (PROSource TF)  45 mL Per Tube QID  . insulin aspart  0-6 Units Subcutaneous Q4H  . lidocaine  1 patch Transdermal Q24H  . mouth rinse  15 mL Mouth Rinse 10 times per day  . metoCLOPramide (REGLAN) injection  10 mg Intravenous Q12H  . pantoprazole (PROTONIX) IV  40 mg Intravenous Q12H  . polyethylene glycol  17 g Per Tube Daily  . QUEtiapine  100 mg Per Tube BID  . senna  1 tablet Per Tube QHS  . sevelamer carbonate  0.8 g Per Tube TID  . sodium chloride flush  10-40 mL Intracatheter Q12H  . sodium chloride flush  3 mL Intravenous Q12H  . Thrombi-Pad  1 each Topical Once   Continuous Infusions: .  prismasol BGK 4/2.5 400 mL/hr at 11/08/20 1700  .  prismasol BGK 4/2.5 200 mL/hr at 11/08/20 1700  . sodium chloride 10 mL/hr at 11/09/20 0800  . sodium chloride Stopped (10/28/20 0954)  . sodium chloride Stopped (11/04/20 0016)  . sodium chloride    . sodium chloride    . amiodarone 30 mg/hr (11/09/20 0800)  . cangrelor 50 mg in NS 250 mL 0.75 mcg/kg/min (11/09/20 0800)  .  ceFAZolin (ANCEF) IV Stopped (11/08/20 2159)  . epinephrine Stopped (11/09/20 0304)  . feeding supplement (VITAL AF 1.2 CAL) Stopped (11/07/20 0700)  . HYDROmorphone 3 mg/hr (11/09/20 0907)  . norepinephrine (LEVOPHED) Adult infusion Stopped (11/09/20 0323)  .  prismasol BGK 4/2.5 1,800 mL/hr at 11/09/20 0208  . propofol (DIPRIVAN) infusion 5 mcg/kg/min (11/08/20 1317)  . vasopressin Stopped (11/09/20 0257)   PRN Meds: sodium chloride, sodium chloride, sodium chloride, acetaminophen, alteplase, bisacodyl, heparin, HYDROmorphone, HYDROmorphone (DILAUDID) injection, HYDROmorphone (DILAUDID) injection, lidocaine (PF), lidocaine-prilocaine, LORazepam, midazolam, nitroGLYCERIN, ondansetron (ZOFRAN) IV, pentafluoroprop-tetrafluoroeth, sodium chloride, sodium chloride flush, sodium chloride flush   Vital Signs    Vitals:   11/09/20 0755 11/09/20 0756 11/09/20 0800 11/09/20 0835  BP: 114/73     Pulse: 87     Resp: 18     Temp:   (!) 100.4 F (38 C) (!) 100.4 F (38 C)  TempSrc:   Core Core  SpO2: 100% 100%    Weight:      Height:        Intake/Output Summary (Last 24 hours) at 11/09/2020 0944 Last data filed at 11/09/2020 0800 Gross per 24 hour  Intake 2996.2 ml  Output 3556 ml  Net -559.8 ml   Last 3 Weights 11/09/2020 11/08/2020 11/08/2020  Weight (lbs) 177 lb 4 oz 177 lb 7.5 oz 177 lb 11.1 oz  Weight (kg) 80.4 kg 80.5 kg 80.6 kg      Telemetry    NSR, PVC - Personally Reviewed  ECG  Physical Exam   GEN: Intubated, sedated  Neck: No JVD Cardiac: RRR, no murmurs, rubs, or gallops.  Respiratory: Clear to auscultation bilaterally. GI: stomach flat, and soft- much improved MS: No edema; No deformity. Neuro: intubated Psych: intubated, sedated  Labs    High Sensitivity Troponin:   Recent Labs  Lab 10/28/20 0629 10/28/20 1046 10/28/20 2040 10/29/20 2121 10/30/20 0548  TROPONINIHS 3,155* >27,000* >27,000* >27,000* >27,000*      Chemistry Recent Labs  Lab 11/08/20 0347 11/08/20 0347 11/08/20 1706 11/08/20 2342 11/09/20 0414  NA 134*   < > 135 134* 137  K 5.5*   < > 5.4* 5.2* 5.0  CL 101   < > 99 101 101  CO2 16*   < > 16* 17* 20*  GLUCOSE 89   < > 111* 176* 72  BUN 112*   < > 94* 73* 64*  CREATININE 7.43*    < > 6.94* 5.65* 5.03*  CALCIUM 9.4   < > 9.3 9.1 9.9  PROT 5.8*  --   --   --   --   ALBUMIN 1.3*  --  1.3*  --  1.3*  AST 53*  --   --   --   --   ALT 5  --   --   --   --   ALKPHOS 137*  --   --   --   --   BILITOT 1.9*  --   --   --   --   GFRNONAA 8*   < > 9* 11* 13*  ANIONGAP 17*   < > 20* 16* 16*   < > = values in this interval not displayed.     Hematology Recent Labs  Lab 11/08/20 0347 11/08/20 1406 11/08/20 1810 11/08/20 2342 11/09/20 0414  WBC 19.8*  --  24.0*  --  14.0*  RBC 2.29*  --  2.52*  --  2.84*  HGB 7.1*   < > 7.7* 8.6* 8.9*  HCT 22.8*   < > 24.3* 26.6* 26.2*  MCV 99.6  --  96.4  --  92.3  MCH 31.0  --  30.6  --  31.3  MCHC 31.1  --  31.7  --  34.0  RDW 21.3*  --  20.7*  --  19.8*  PLT 192  --  207  --  186   < > = values in this interval not displayed.    BNPNo results for input(s): BNP, PROBNP in the last 168 hours.   DDimer No results for input(s): DDIMER in the last 168 hours.   Radiology    CT ABDOMEN PELVIS W CONTRAST  Result Date: 11/07/2020 CLINICAL DATA:  Nausea and vomiting EXAM: CT ABDOMEN AND PELVIS WITH CONTRAST TECHNIQUE: Multidetector CT imaging of the abdomen and pelvis was performed using the standard protocol following bolus administration of intravenous contrast. CONTRAST:  176m OMNIPAQUE IOHEXOL 300 MG/ML  SOLN COMPARISON:  Multiple exams, including 11/01/2020 and abdominal radiograph from 11/07/2020 FINDINGS: Lower chest: Coronary atherosclerosis and mild cardiomegaly. Small right pleural effusion. Continued bibasilar airspace opacities with air bronchograms especially posteriorly in the lower lobes, some of the right middle lobe airspace opacity has resolved. Hepatobiliary: Dependent density in the gallbladder possibly probably from prior contrast excretion. On prior MRI thoracic spine there was a T2 hyperintense lesion in the dome of the right hepatic lobe posteriorly, this is not readily apparent on today's CT scan although this  partly may be from streak artifact related to  the patient's arm positioning. Pancreas: Mild dorsal pancreatic duct dilatation is observed in the pancreatic body and head, up to 0.4 cm diameter. No specific cause is identified. Spleen: Unremarkable Adrenals/Urinary Tract: Adrenal glands unremarkable. Right nephrectomy. Atrophic left kidney with multiple cysts, including a rim calcified lesion which is unchanged. Empty urinary bladder. Stomach/Bowel: Nasogastric tube terminates the stomach body and a feeding tube terminates in the stomach antrum. Scattered diverticula of the colon most concentrated in the ascending and sigmoid colon. No dilated bowel. Normal appendix. Vascular/Lymphatic: Aortoiliac atherosclerotic vascular disease. Mild stranding around the right common femoral artery likely related to recent catheterization common no substantial or masslike hematoma identified. Reproductive: Borderline prostatomegaly. Other: No supplemental non-categorized findings. Musculoskeletal: Degenerative disc disease and degenerative facet arthropathy in the lower lumbar spine. IMPRESSION: 1. A specific cause for the patient's nausea and vomiting is not identified. 2. Continued bibasilar airspace opacities with air bronchograms especially posteriorly in the lower lobes, some of the right middle lobe airspace opacity has resolved. Small right pleural effusion. 3. Mild dorsal pancreatic duct dilatation in the pancreatic body and head, up to 0.4 cm diameter. No specific cause is identified. 4. Atrophic left kidney with multiple cysts, including a rim calcified lesion which is unchanged. 5. The T2 hyperintense lesion in segment 7 of the liver on prior thoracic spine MRI is not well seen on today's CT scan. The region is partially obscured by streak artifact from the patient's arms. 6. Other imaging findings of potential clinical significance: Coronary atherosclerosis and mild cardiomegaly. Borderline prostatomegaly. Degenerative  disc disease and degenerative facet arthropathy in the lower lumbar spine. 7. Aortic atherosclerosis. Aortic Atherosclerosis (ICD10-I70.0). Electronically Signed   By: Van Clines M.D.   On: 11/07/2020 11:53   DG CHEST PORT 1 VIEW  Result Date: 11/08/2020 CLINICAL DATA:  Hypoxia EXAM: PORTABLE CHEST 1 VIEW COMPARISON:  November 06, 2020 FINDINGS: Endotracheal tube tip is 5.1 cm above the carina. Nasogastric tube tip and side port are below the diaphragm. Left subclavian catheter tip is in the left innominate vein near the junction with the superior vena cava. Right subclavian catheter tip is in the right atrium slightly beyond the cavoatrial junction. No pneumothorax. There is a small right pleural effusion. There is ill-defined airspace opacity in each lower lung region, primarily due to atelectasis with potential superimposed bibasilar pneumonia. There is cardiomegaly with pulmonary venous hypertension. No adenopathy. Evidence of old healed right clavicle fracture. There is aortic atherosclerosis. IMPRESSION: Tube and catheter positions as described without pneumothorax. Small right pleural effusion. Ill-defined opacity in the lung bases, slightly increased, likely due to combination of atelectasis and potential bibasilar pneumonia. Stable cardiomegaly with a degree of pulmonary vascular congestion. Aortic Atherosclerosis (ICD10-I70.0). Electronically Signed   By: Lowella Grip III M.D.   On: 11/08/2020 08:15   DG Abd Portable 1V  Result Date: 11/08/2020 CLINICAL DATA:  Feeding tube placement and orogastric tube placement EXAM: PORTABLE ABDOMEN - 1 VIEW COMPARISON:  Portable exam 1217 hours compared to 11/07/2020 FINDINGS: Central venous catheter tip projects over RIGHT atrium. Feeding tube coiled in stomach. Nasogastric tube tip in stomach. Gaseous distention of stomach new since prior exam. Scattered residual contrast throughout colon. Rounded calcification projects over LEFT kidney  corresponding to a calcified cyst on a prior CT. Osseous structures unremarkable. IMPRESSION: Nasogastric tube and coiled feeding tube both located within a stomach distended by gas. Electronically Signed   By: Lavonia Dana M.D.   On: 11/08/2020 12:34   US Abdomen Limited  RUQ (LIVER/GB)  Result Date: 11/07/2020 CLINICAL DATA:  Nausea vomiting for several hours EXAM: ULTRASOUND ABDOMEN LIMITED RIGHT UPPER QUADRANT COMPARISON:  CT from earlier in the same day. FINDINGS: Gallbladder: Gallbladder is well distended with evidence of gallbladder sludge and multiple gallbladder polyps. No stones are seen. No wall thickening or pericholecystic fluid is noted. Common bile duct: Diameter: 6.6 mm. Liver: The hepatic echogenicity is within normal limits with the exception of a focal area of increased echogenicity measuring 2.3 cm in greatest dimension. This is consistent with a small hemangioma. This is also stable from recent MRI examination. Portal vein is patent on color Doppler imaging with normal direction of blood flow towards the liver. Other: None. IMPRESSION: Gallbladder sludge and gallbladder polyps without complicating factors. Changes consistent with hepatic hemangioma. Electronically Signed   By: Inez Catalina M.D.   On: 11/07/2020 19:47    Cardiac Studies     Patient Profile     59 y.o. male with STEMI, CAD, AFib and multiple other issues including ileus  Assessment & Plan    CAD/STEMI: IV cangrelor while GI absorption is in question.  Plan for further PCI when there is some recovery from other medical issues.   AFib: In NSR with PVCs. Continue IV Amio. IV heparin.  Hbg improved.  ESRD: on HD.  MSSA: mitral regurg noted but no vegetation seen on TEE.  For questions or updates, please contact [place] Please consult [email or web address] for contact info under        Signed, Larae Grooms, MD  11/09/2020, 9:44 AM

## 2020-11-10 DIAGNOSIS — I25118 Atherosclerotic heart disease of native coronary artery with other forms of angina pectoris: Secondary | ICD-10-CM | POA: Diagnosis not present

## 2020-11-10 DIAGNOSIS — I2102 ST elevation (STEMI) myocardial infarction involving left anterior descending coronary artery: Secondary | ICD-10-CM | POA: Diagnosis not present

## 2020-11-10 DIAGNOSIS — I4891 Unspecified atrial fibrillation: Secondary | ICD-10-CM | POA: Diagnosis not present

## 2020-11-10 DIAGNOSIS — K922 Gastrointestinal hemorrhage, unspecified: Secondary | ICD-10-CM

## 2020-11-10 DIAGNOSIS — I2109 ST elevation (STEMI) myocardial infarction involving other coronary artery of anterior wall: Secondary | ICD-10-CM | POA: Diagnosis not present

## 2020-11-10 DIAGNOSIS — N186 End stage renal disease: Secondary | ICD-10-CM | POA: Diagnosis not present

## 2020-11-10 LAB — BPAM RBC
Blood Product Expiration Date: 202201032359
Blood Product Expiration Date: 202201042359
Blood Product Expiration Date: 202201052359
ISSUE DATE / TIME: 202112011946
ISSUE DATE / TIME: 202112021231
ISSUE DATE / TIME: 202112021728
Unit Type and Rh: 5100
Unit Type and Rh: 5100
Unit Type and Rh: 5100

## 2020-11-10 LAB — RENAL FUNCTION PANEL
Albumin: 1.2 g/dL — ABNORMAL LOW (ref 3.5–5.0)
Albumin: 1.1 g/dL — ABNORMAL LOW (ref 3.5–5.0)
Anion gap: 11 (ref 5–15)
Anion gap: 12 (ref 5–15)
BUN: 38 mg/dL — ABNORMAL HIGH (ref 6–20)
BUN: 37 mg/dL — ABNORMAL HIGH (ref 6–20)
CO2: 22 mmol/L (ref 22–32)
CO2: 17 mmol/L — ABNORMAL LOW (ref 22–32)
Calcium: 9.5 mg/dL (ref 8.9–10.3)
Calcium: 7.8 mg/dL — ABNORMAL LOW (ref 8.9–10.3)
Chloride: 103 mmol/L (ref 98–111)
Chloride: 108 mmol/L (ref 98–111)
Creatinine, Ser: 3.99 mg/dL — ABNORMAL HIGH (ref 0.61–1.24)
Creatinine, Ser: 3.48 mg/dL — ABNORMAL HIGH (ref 0.61–1.24)
GFR, Estimated: 17 mL/min — ABNORMAL LOW (ref >60)
GFR, Estimated: 20 mL/min — ABNORMAL LOW (ref >60)
Glucose, Bld: 89 mg/dL (ref 70–99)
Glucose, Bld: 57 mg/dL — ABNORMAL LOW (ref 70–99)
Phosphorus: 3.8 mg/dL (ref 2.5–4.6)
Phosphorus: 3.2 mg/dL (ref 2.5–4.6)
Potassium: 4 mmol/L (ref 3.5–5.1)
Potassium: 4.1 mmol/L (ref 3.5–5.1)
Sodium: 136 mmol/L (ref 135–145)
Sodium: 137 mmol/L (ref 135–145)

## 2020-11-10 LAB — CBC
HCT: 30.9 % — ABNORMAL LOW (ref 39.0–52.0)
Hemoglobin: 10.2 g/dL — ABNORMAL LOW (ref 13.0–17.0)
MCH: 30.1 pg (ref 26.0–34.0)
MCHC: 33 g/dL (ref 30.0–36.0)
MCV: 91.2 fL (ref 80.0–100.0)
Platelets: 175 10*3/uL (ref 150–400)
RBC: 3.39 MIL/uL — ABNORMAL LOW (ref 4.22–5.81)
RDW: 19 % — ABNORMAL HIGH (ref 11.5–15.5)
WBC: 11.7 10*3/uL — ABNORMAL HIGH (ref 4.0–10.5)
nRBC: 0.3 % — ABNORMAL HIGH (ref 0.0–0.2)

## 2020-11-10 LAB — TYPE AND SCREEN
ABO/RH(D): O POS
Antibody Screen: NEGATIVE
Unit division: 0
Unit division: 0
Unit division: 0

## 2020-11-10 LAB — CULTURE, BLOOD (ROUTINE X 2)
Special Requests: ADEQUATE
Special Requests: ADEQUATE

## 2020-11-10 LAB — GLUCOSE, CAPILLARY
Glucose-Capillary: 64 mg/dL — ABNORMAL LOW (ref 70–99)
Glucose-Capillary: 70 mg/dL (ref 70–99)
Glucose-Capillary: 72 mg/dL (ref 70–99)
Glucose-Capillary: 77 mg/dL (ref 70–99)
Glucose-Capillary: 80 mg/dL (ref 70–99)
Glucose-Capillary: 82 mg/dL (ref 70–99)
Glucose-Capillary: 95 mg/dL (ref 70–99)

## 2020-11-10 LAB — MAGNESIUM: Magnesium: 2.4 mg/dL (ref 1.7–2.4)

## 2020-11-10 MED ORDER — DEXTROSE 50 % IV SOLN
INTRAVENOUS | Status: AC
  Administered 2020-11-10: 12.5 g via INTRAVENOUS
  Filled 2020-11-10: qty 50

## 2020-11-10 MED ORDER — DEXTROSE-NACL 5-0.45 % IV SOLN: INTRAVENOUS | Status: DC

## 2020-11-10 MED ORDER — MIDODRINE HCL 5 MG PO TABS: 10.0000 mg | ORAL_TABLET | Freq: Three times a day (TID) | ORAL | Status: DC

## 2020-11-10 MED ORDER — DEXTROSE 50 % IV SOLN: 12.5000 g | Freq: Once | INTRAVENOUS | Status: AC

## 2020-11-10 NOTE — Progress Notes (Signed)
Travis [name] 10:05 AM  Subjective: Patient seen and examined and discussed with his nurse and his wife and has had no bowel movements or signs of obvious bleeding and his wife feels he is more alert and better and we answered all of her questions Objective: Patient intubated on ventilator abdomen is soft nontender rare bowel sound hemoglobin okay  Assessment: Multiple medical problems including severe esophagitis and duodenal ulcers probably from ischemia  Plan: Continue pump inhibitors and supportive care per multiple consultants and critical care and consider low-dose Reglan when feeds reinitiated and please let us know if we can be of any further assistance with this hospital stay  [facility]  office [phone] After 5PM or if no answer call [phone]

## 2020-11-10 NOTE — Progress Notes (Signed)
Pressors on hold. Pt agitated.

## 2020-11-10 NOTE — Progress Notes (Signed)
[facility] Progress Note   Subjective: Seen in room. Son and wife at bedside. Had EGD yesterday and he dumped large amount bloody drainage from OG, dropped BP-had to resume pressors. Issues with hypoglycrmia. EGD revealed severe esophagitis and duodenal ulcers probably from ischemia, but no active bleed found. Sedation changed to precedex. Today, He recognizes me, attempting to talk. Off pressors.   Objective Vitals:   11/10/20 0700 11/10/20 0751 11/10/20 0800 11/10/20 0901  BP:   (!) 176/86 116/83  Pulse: 85  96 79  Resp: (!) 30  (!) 29 (!) 22  Temp:  98.8 F (37.1 C)    TempSrc:  Axillary    SpO2: 95%  98% 96%  Weight:      Height:       Physical Exam General:Critically ill male intubated, sedated on precedex. Responding to verbal stimuli appropriately.  Heart:S1,S2 No M/R/G Lungs:Bilateral breath sounds, symmetrical chest excursions. Coarse breath sounds few scattered rhonchi, clears with suctioning. decreased in bases. Abdomen:No BS. OG withred-Tisheena Maguire drainage. Extremities: No LE edema.BLE heel protectors. Dialysis Access:L AVF + T/B Temp cath in place.Triple lumen CVL.    Additional Objective Labs: Basic Metabolic Panel: Recent Labs  Lab 11/09/20 0414 11/09/20 2142 11/10/20 0346  NA 137 134* 136  K 5.0 4.1 4.0  CL 101 101 103  CO2 20* 20* 22  GLUCOSE 72 95 89  BUN 64* 42* 38*  CREATININE 5.03* 4.37* 3.99*  CALCIUM 9.9 9.2 9.5  PHOS 7.2* 3.9 3.8   Liver Function Tests: Recent Labs  Lab 11/08/20 0347 11/08/20 1706 11/09/20 0414 11/09/20 2142 11/10/20 0346  AST 53*  --   --   --   --   ALT 5  --   --   --   --   ALKPHOS 137*  --   --   --   --   BILITOT 1.9*  --   --   --   --   PROT 5.8*  --   --   --   --   ALBUMIN 1.3*   < > 1.3* 1.2* 1.2*   < > = values in this interval not displayed.   No results for input(s): LIPASE, AMYLASE in the last 168 hours. CBC: Recent Labs  Lab 11/08/20 0347 11/08/20 1406 11/08/20 1810  11/08/20 2342 11/09/20 0414 11/09/20 2140 11/10/20 0346  WBC 19.8*  --  24.0*  --  14.0* 13.5* 11.7*  HGB 7.1*   < > 7.7*   < > 8.9* 9.4* 10.2*  HCT 22.8*   < > 24.3*   < > 26.2* 29.0* 30.9*  MCV 99.6  --  96.4  --  92.3 91.8 91.2  PLT 192  --  207  --  186 172 175   < > = values in this interval not displayed.   Blood Culture    Component Value Date/Time   SDES BLOOD BLOOD RIGHT HAND 11/09/2020 0554   SPECREQUEST  11/09/2020 0554    BOTTLES DRAWN AEROBIC AND ANAEROBIC Blood Culture adequate volume   CULT  11/09/2020 0554    NO GROWTH 1 DAY Performed at [facility], [place] [address] [place], [place] [address]    REPTSTATUS PENDING 11/09/2020 1700    Cardiac Enzymes: No results for input(s): CKTOTAL, CKMB, CKMBINDEX, TROPONINI in the last 168 hours. CBG: Recent Labs  Lab 11/09/20 1721 11/09/20 2103 11/09/20 2337 11/10/20 0352 11/10/20 0755  GLUCAP 96 81 77 82 72   Iron Studies: No  results for input(s): IRON, TIBC, TRANSFERRIN, FERRITIN in the last 72 hours. _0 @ Studies/Results: DG Abd 1 View  Result Date: 11/09/2020 CLINICAL DATA:  Ileus. EXAM: ABDOMEN - 1 VIEW COMPARISON:  November 08, 2020. FINDINGS: The bowel gas pattern is normal. Distal tip of nasogastric tube is seen in expected position of distal stomach. Feeding tube is seen looped within the stomach with distal tip in the proximal stomach. Gastric distention noted on prior exam has resolved. Residual contrast and stool is noted throughout the colon. No radio-opaque calculi or other significant radiographic abnormality are seen. IMPRESSION: No evidence of bowel obstruction or ileus. Feeding tube tip is seen in proximal stomach. Gastric distention noted on prior exam has resolved. Electronically Signed   By: Marijo Conception M.D.   On: 11/09/2020 11:12   DG Abd Portable 1V  Result Date: 11/08/2020 CLINICAL DATA:  Feeding tube placement and orogastric tube placement EXAM: PORTABLE ABDOMEN - 1 VIEW  COMPARISON:  Portable exam 1217 hours compared to 11/07/2020 FINDINGS: Central venous catheter tip projects over RIGHT atrium. Feeding tube coiled in stomach. Nasogastric tube tip in stomach. Gaseous distention of stomach new since prior exam. Scattered residual contrast throughout colon. Rounded calcification projects over LEFT kidney corresponding to a calcified cyst on a prior CT. Osseous structures unremarkable. IMPRESSION: Nasogastric tube and coiled feeding tube both located within a stomach distended by gas. Electronically Signed   By: Lavonia Dana M.D.   On: 11/08/2020 12:34   Medications: .  prismasol BGK 4/2.5 400 mL/hr at 11/08/20 1700  .  prismasol BGK 4/2.5 200 mL/hr at 11/10/20 0226  . sodium chloride 10 mL/hr at 11/10/20 0900  . sodium chloride Stopped (10/28/20 0954)  . sodium chloride Stopped (11/04/20 0016)  . sodium chloride    . sodium chloride    . amiodarone 30 mg/hr (11/10/20 0900)  . cangrelor 50 mg in NS 250 mL 0.75 mcg/kg/min (11/10/20 0900)  .  ceFAZolin (ANCEF) IV 2 g (11/10/20 1006)  . dexmedetomidine (PRECEDEX) IV infusion 1 mcg/kg/hr (11/10/20 0900)  . epinephrine Stopped (11/09/20 0304)  . norepinephrine (LEVOPHED) Adult infusion Stopped (11/10/20 0555)  . prismasol BGK 4/2.5 1,800 mL/hr at 11/10/20 0710  . propofol (DIPRIVAN) infusion Stopped (11/09/20 1520)  . vasopressin Stopped (11/09/20 1708)   . sodium chloride   Intravenous Once  . aspirin  81 mg Per Tube Daily  . atorvastatin  80 mg Per Tube q1800  . B-complex with vitamin C  1 tablet Per Tube Daily  . bisacodyl  10 mg Rectal BID  . chlorhexidine gluconate (MEDLINE KIT)  15 mL Mouth Rinse BID  . Chlorhexidine Gluconate Cloth  6 each Topical Q0600  . clonazepam  1 mg Per Tube BID  . docusate  100 mg Per Tube BID  . doxercalciferol  5 mcg Intravenous Q M,W,F  . insulin aspart  0-6 Units Subcutaneous Q4H  . lidocaine  1 patch Transdermal Q24H  . mouth rinse  15 mL Mouth Rinse 10 times per day  .  metoCLOPramide (REGLAN) injection  10 mg Intravenous Q12H  . pantoprazole (PROTONIX) IV  40 mg Intravenous Q12H  . polyethylene glycol  17 g Per Tube Daily  . QUEtiapine  100 mg Per Tube BID  . senna  1 tablet Per Tube QHS  . sevelamer carbonate  0.8 g Per Tube TID  . sodium chloride flush  10-40 mL Intracatheter Q12H  . sodium chloride flush  3 mL Intravenous Q12H     OP  HD:AF MWF 4h 450/800 81.5kg 2/2.25 bath RUE AVF  ---Hep 5000units IV initial bolus+ 1000 units IVmidrun - hect 5 ug tiw  - mircera 50 ugIVq 4 weeks, last 11/15 (due 11/29)    Assessment/ Plan: 1. ESRD - usual HD MWF. ^^LVEDP per initial LHC.Started CRRT 11/24. Stopped 11/29 resumed 11/08/2020. SCr 3.99 BUN 38 this AM. K+ 4.0, running even, no heparin.  2. Acute STEMI- SP LAD PCI11/20 am, has otherdisease inLCx/ RCA.Transitioned to brilinta from cangrelor. Per cardiology. 3. MSSA cavitary PNA / bacteremia-intubated 11/23. Per PCCM. Respiratory culture 11/23 with MSSA. Blood cx +11/21.CT abd/ pelvis negative 11/23.F/U bcx's negative.Dialysis duplex 11/22 without abscessandUS of AVF wasnegative 11/27. Plan for 6 weeks of IV cefazolin per pharmacy ending 12/31/2019 . ID signed off.. Less agitated on vent today  4. PEA arrest: occurred in setting of agitation/ sedation.Brief CPR. 5. Vomiting- on 11/30, OG in place. 1.5 liters emesis removed.CT Ab/Pelvis with contrastwas unrevealing.Went for EGD yesterday. severe esophagitis and duodenal ulcers probably from ischemia. Per primary, GI.  6. Volume-appears euvolemic by exam. I & O+[id number]. Just got off pressors. Continue to run even.  No excess volume by exam, CXR with mild vascular congestion, sm R pleural effuson 11/08/2020. Continue to run even for another 24 hours, avoid hypotension.   7. Shock:off levo/vasopressin at present. 8. Anemia ckd - HGB 7.1 12/01 S/P 1 unit PRBCs. HGB 8s today.  9. MBD ckd -Orally intubated, binders on  hold. 10. Afib:on amio and hep gtt, s/p DCCV.  10.Severe MR:per cardiology.  Asjia Berrios H. Christy Ehrsam NP-C 11/10/2020, 10:25 AM  [employer] [phone]

## 2020-11-10 NOTE — Progress Notes (Addendum)
Nutrition Follow-up  DOCUMENTATION CODES:   Severe malnutrition in context of chronic illness  INTERVENTION:   Recommend small bore tube placement in IR, if not available or unsuccessful recommend initiate TPN until enteral access can be obtained safely as patient meets criteria for severe malnutrition. (secure chat sent to CCM)  If able to place tube recommend Vital 1.5 @ 55 ml/hr 90 ml ProSource TF QID Provides: 2300 kcal, 177 grams protein, and 1003 ml free water.   NUTRITION DIAGNOSIS:   Severe Malnutrition related to chronic illness as evidenced by severe muscle depletion, severe fat depletion.  Ongoing.   GOAL:   Patient will meet greater than or equal to 90% of their needs  Not met.   MONITOR:   I & O's, Labs, Skin  REASON FOR ASSESSMENT:   Ventilator    ASSESSMENT:   Patient with PMH significant for HTN and ESRD in HD. Presents this admission with STEMI.  Pt meets criteria for severe malnutrition, suspect from chronic illness per wife report. Pt without adequate nutrition x 10 days of 13 day admission.  Remains on CRRT.  Off pressors   11/20 PCI to LAD 11/23 Intubated 11/24 OG tube in esophagus, unable to advance 11/26 Cortrak attempted but unable to advance past 55 cm, likely at GE junction. DR advanced to duodenal bulb; TF initiated 11/30 Brown malodorous output from mouth and OG, TF held, CT negative for obstruction, ileus with no BM x 10 days. Abd xray with OG tube GE junction and Cortrak now retraced back into stomach; both needing advancement 12/01 iHD not tolerated, CRRT to resume, +large amount of liquid stool post enema, Attempted Cortrak advancement but unsuccessful 12/2 pt with large volume blood from OG tube; s/p EGD with noted severe esophagitis and erosions/ulcerations in duodenal bulb compatible with ischemia (probable ischemic duodenitis)   12/3 extubated; attempted cortrak placement unsuccessful  Spoke with wife at bedside. Per wife pt was  very active PTA. Despite this she had noticed a big decrease in his muscle mass especially in his BLE. She states that despite this the MD at HD had reported that he was at an appropriate weight. We discussed how fluid status can impact weight. She states that about 6 months ago pt was sick from taking his binders and that he eventually moved to taking them after his meal and this helped his nausea greatly. She feels that he lost muscle during this time.  Pt has been extubated and appears to be struggling with secretions. Wife offering frequent suctioning. Wife agreeable to placing short term feeding tube for nutrition.   Medications reviewed and include: B-complex with C, dulcolax, colace, doxercalciferol, SSI, reglan, miralax, renvela Precedex Labs reviewed  UF: 1997 ml  NUTRITION - FOCUSED PHYSICAL EXAM:    Most Recent Value  Orbital Region Mild depletion  Upper Arm Region Severe depletion  Thoracic and Lumbar Region Severe depletion  Buccal Region Mild depletion  Temple Region Moderate depletion  Clavicle Bone Region Severe depletion  Clavicle and Acromion Bone Region Severe depletion  Scapular Bone Region Unable to assess  Dorsal Hand No depletion  Patellar Region Severe depletion  Anterior Thigh Region Severe depletion  Posterior Calf Region Moderate depletion  Edema (RD Assessment) Mild  Hair Reviewed  Eyes Reviewed  Mouth Reviewed  Skin Reviewed  Nails Reviewed  [pale]       Diet Order:   Diet Order            Diet NPO time specified  Diet  effective now                 EDUCATION NEEDS:   Not appropriate for education at this time  Skin:  Skin Assessment: (P) Skin Integrity Issues: (skin tear vs pressure injury to sacrum noted today, not yet documented by RN) Skin Integrity Issues:: Stage II Stage II: buttocks  Last BM:  12/1  Height:   Ht Readings from Last 1 Encounters:  10/28/20 5' 11"  (1.803 m)    Weight:   Wt Readings from Last 1 Encounters:   11/10/20 79.1 kg    Ideal Body Weight:     BMI:  Body mass index is 24.32 kg/m.  Estimated Nutritional Needs:   Kcal:  2250-2500  Protein:  160-197 grams  Fluid:  1000 ml + UOP (liberalized with CRRT)  Lockie Pares., RD, LDN, CNSC See AMiON for contact information

## 2020-11-10 NOTE — Progress Notes (Signed)
Cortrak Tube Team Note:  Consult received to place a Cortrak feeding tube.   Pt extubated earlier today. RN at bedside on arrival to place Cortrak tube.   Cortrak RD began inserting Cortrak tube but had difficulty advancing past 45 cm; tube tip likely at GE junction. Noted multiple difficulties in OG and Cortrak tube attempts previously. Pt initially coopertive and calm but after some time had passed at attempting to advance tube, pt because upsest/agitated and grabbed Cortrak placer by the collar. RN came to the bedside to calm pt down but pt continued to attempt to grab the [employer]. Decision was made to abort further Cortrak attempts.   RN aware. MD to be notified  Kerman Passey MS, RDN, LDN, [place] Registered Dietitian III Clinical Nutrition RD Pager and On-Call Pager Number Located in [place]

## 2020-11-10 NOTE — Progress Notes (Signed)
eLink Physician-Brief Progress Note Patient Name: Travis [name] DOB: 19-Jul-1961 MRN: [id number]   Date of Service  11/10/2020  HPI/Events of Note  Patient's Cortrak is out and he is not receiving any calories, his blood sugar is running on the low side.  eICU Interventions  D 5 0.45 % water infusion started at 50 ml / hour.        Kerry Kass Shaneequa Bahner 11/10/2020, 9:26 PM

## 2020-11-10 NOTE — Progress Notes (Signed)
98 ml (49mg ) of Hydromorphone (dilaudid) drip (50mg /168ml) wasted in steri cycle at this time.  Witnessed by Cleda Mccreedy, RN.

## 2020-11-10 NOTE — Progress Notes (Signed)
NAME:  Travis [name], MRN:  [id number], DOB:  1961/04/21, LOS: 51 ADMISSION DATE:  10/28/2020, CONSULTATION DATE:  10/29/20 REFERRING MD:  Cardiology - [name], CHIEF COMPLAINT:  Hypotension, gram positive cocci on culture, AMS  Brief History   Travis [name] is a 59 year old man with a history of HTN, ESRD on HD MWF, RUE AV fistula, hx of smoking, admitted 10/28/20 with fevers, myalgias for several days, acute chest pain.    History of present illness   On Admission, found to have STEMI, multivessel disease. Pulm edema with mild hypoxemia.  S/Post PCI to the LAD on 10/29/19. Planning for staged intervention to the left circumflex and possibly right coronary artery.  EF 59-74%, diastolic dysfunction, LVEDP 35. Underwent HD on 11/20 with 4 L volume removed.  Saturation improved after dialysis (100% on RA) Fevers noted 11/20. Blood cultures grew MSSA, noted this evening. .    This evening developed Afib with RVR 180s. Adenosine given, without improvement  Amiodarone infusion started per cardiology, 150mg  bolus, then drip.  Lopressor 5mg IV and 500ccNS given. Started on Phenylephrine, was on 269mcg on my arrival.   Cardioverted emergently at 120J for ongoing afib with hypotension (60/40) and AMS.  Converted to sinus.  Remained moderately hypotensive (MAP 60) on phenylephrine.   Patient was given one dose zosyn, switched to ancef once cultures grew back MSSA.   Cardiac stress tests annually, last done 11/16/19.    Past Medical History  HTN ESRD, HD MWF  Significant Hospital Events   Cardioversion 11/21 Cardiac cath stent to LAD 11/20  Consults:  PCCM  Nephrology ID  Procedures:  Central line 11/21 >> 11/29 Arterial line 11/21 Repeat DCCV 11/23 11/23 ETT HD line 11/24 Arterial line 11/24 L [place] CVC 11/30>>   Significant Diagnostic Tests:  11/24 TEE>>Left ventricular ejection fraction, by estimation, is 35 to 40%. The  left ventricle has moderately decreased function.Moderate to  severe mitral regurgitation. 11/23 CT Chest>>Extensive multifocal nodular and patchy airspace disease in both lungs with a slight peripheral predominance in the upper lungs. 3.1 cm nodular consolidative opacity in the right upper lobe is cavitated. Imaging features likely related to multifocal pneumonia. Septic emboli and metastatic disease considered less likely but not excluded. 11/22 LUE Vas Upper Extremity Doppler>> Arteriovenous fistula-Aneurysmal dilatation noted. 11/24 CT abdomen - no retroperitoneal hematoma 11/29 MR Brain>> No evidence acute intracranial abnormality, sinisitis, mastoid effusions 11/29>> MR Cervical Spine>> Given the provided history of bacteremia, facet joint septic arthritis is difficult to definitively exclude, but the lack of any surrounding marrow edema argues against this Cervical spondylosis  11/29 MR Thoracic Spine No significant marrow edema in thoracic spine , Mild thoracic spondylosis hypointense marrow signal throughout the thoracic spine, likely related to the patient's end-stage renal disease. multifocal airspace disease and cavitary pulmonary lesions. Small right pleural effusion. 11/29 MR Lumbar Spine:As noted above and Suspected cholecystolithiasis  Micro Data:  11/21 BCx2>>Staph aureus > MSSA by BCID 11/24 BCx2>> 11/30 BC x 2>> staph epi>>  Antimicrobials:  Zosyn 11/21 x 1 Ancef 11/21 ->  Interim history/subjective:  No events.  Remains on vent and CRRT.  Waking up and following commands.  Gets agitated with SBT and coughs on tube.  Objective   Blood pressure 116/83, pulse 79, temperature 98.8 F (37.1 C), temperature source Axillary, resp. rate (!) 22, height 5\' 11"  (1.803 m), weight 79.1 kg, SpO2 96 %. CVP:  [5 mmHg-8 mmHg] 5 mmHg  Vent Mode: PRVC FiO2 (%):  [30 %-60 %]  60 % Set Rate:  [16 bmp] 16 bmp Vt Set:  [600 mL] 600 mL PEEP:  [5 cmH20] 5 cmH20 Pressure Support:  [10 cmH20] 10 cmH20 Plateau Pressure:  [14 cmH20-19 cmH20] 19  cmH20   Intake/Output Summary (Last 24 hours) at 11/10/2020 1119 Last data filed at 11/10/2020 1000 Gross per 24 hour  Intake 2711.14 ml  Output 1861 ml  Net 850.14 ml   Filed Weights   11/08/20 1330 11/09/20 0500 11/10/20 0500  Weight: 80.5 kg 80.4 kg 79.1 kg    Constitutional: no acute distress on ventilator  Eyes: EOMI, pupils equal Ears, nose, mouth, and throat: ETT in place, thick pink secretions Cardiovascular: RRR, ext warm Respiratory: Scattered rhonci, triggering vent Gastrointestinal: Soft, hypoactive BS Skin: No rashes, normal turgor, R tunneled catheter CDI Neurologic: moves all 4 ext to command Psychiatric: RASS -1  Chemistries look okay on CRRT CBC stable Sugars on lower side  Resolved Hospital Problem list     Assessment & Plan:   Anterior STEMI, CAD, acute CHF exacerbation- will need definitive PCI once recovers Afib s/p amio now sinus rhythm MSSA bacteremia, source thought HD site on cefazolin ESRD on HD Vasoplegia limiting iHD attempts- hopefully improves with  UGIB- secondary to severe esophagitis and duodenal ulcer question ischemic s/p EGD on 11/09/20 Constipation- resolved Acute toxic metabolic encephalopathy- improved on precedex Acute hypoxemic respiratory failure- weaning attempts limited by secretions and agitation - SBT, SAT, trial of extubation, if fails may need trach but not ideal given antiplatelet agents - Wean precedex - Continue prolonged course of abx - Continue CRRT for today, hopefully after vent liberation can work back toward iHD, start midodrine, continue reglan - Continue PPI, watch H/H - Continue cangrelor, restart heparin in AM if H/H stable  Best practice:  Diet: NPO Pain/Anxiety/Delirium protocol (if indicated): wean VAP protocol (if indicated): Bundle in place DVT prophylaxis: on heparin gtt GI prophylaxis: protonix Glucose control: Phase 1 glycemic control Mobility: Bedrest Code Status: Full  Family Communication:  Spoke with wife  at bedside 12/3 Disposition: ICU   Patient critically ill due to respiratory failure, shock, respiratory failure Interventions to address this today CRRT, pressor titration, vent titration Risk of deterioration without these interventions is high  I personally spent 37 minutes providing critical care not including any separately billable procedures  Erskine Emery MD [place] [facility] 11/10/2020 11:45 AM Personal pager: #[phone] If unanswered, please page CCM On-call: [phone]

## 2020-11-10 NOTE — Progress Notes (Addendum)
Progress Note  Patient Name: Travis [name] Date of Encounter: 11/10/2020  [facility] Cardiologist: Glenetta Hew, MD   Subjective   More alert from yesterday  Inpatient Medications    Scheduled Meds:  sodium chloride   Intravenous Once   aspirin  81 mg Per Tube Daily   atorvastatin  80 mg Per Tube q1800   B-complex with vitamin C  1 tablet Per Tube Daily   bisacodyl  10 mg Rectal BID   chlorhexidine gluconate (MEDLINE KIT)  15 mL Mouth Rinse BID   Chlorhexidine Gluconate Cloth  6 each Topical Q0600   clonazepam  1 mg Per Tube BID   docusate  100 mg Per Tube BID   doxercalciferol  5 mcg Intravenous Q M,W,F   insulin aspart  0-6 Units Subcutaneous Q4H   lidocaine  1 patch Transdermal Q24H   mouth rinse  15 mL Mouth Rinse 10 times per day   metoCLOPramide (REGLAN) injection  10 mg Intravenous Q12H   pantoprazole (PROTONIX) IV  40 mg Intravenous Q12H   polyethylene glycol  17 g Per Tube Daily   QUEtiapine  100 mg Per Tube BID   senna  1 tablet Per Tube QHS   sevelamer carbonate  0.8 g Per Tube TID   sodium chloride flush  10-40 mL Intracatheter Q12H   sodium chloride flush  3 mL Intravenous Q12H   Continuous Infusions:   prismasol BGK 4/2.5 400 mL/hr at 11/08/20 1700    prismasol BGK 4/2.5 200 mL/hr at 11/10/20 0226   sodium chloride 10 mL/hr at 11/10/20 0900   sodium chloride Stopped (10/28/20 0954)   sodium chloride Stopped (11/04/20 0016)   sodium chloride     sodium chloride     amiodarone 30 mg/hr (11/10/20 0900)   cangrelor 50 mg in NS 250 mL 0.75 mcg/kg/min (11/10/20 0900)    ceFAZolin (ANCEF) IV Stopped (11/09/20 2223)   dexmedetomidine (PRECEDEX) IV infusion 1 mcg/kg/hr (11/10/20 0900)   epinephrine Stopped (11/09/20 0304)   norepinephrine (LEVOPHED) Adult infusion Stopped (11/10/20 0555)   prismasol BGK 4/2.5 1,800 mL/hr at 11/10/20 0710   propofol (DIPRIVAN) infusion Stopped (11/09/20 1520)   vasopressin  Stopped (11/09/20 1708)   PRN Meds: sodium chloride, sodium chloride, sodium chloride, acetaminophen, alteplase, bisacodyl, fentaNYL (SUBLIMAZE) injection, fentaNYL (SUBLIMAZE) injection, heparin, lidocaine (PF), lidocaine-prilocaine, LORazepam, midazolam, nitroGLYCERIN, ondansetron (ZOFRAN) IV, pentafluoroprop-tetrafluoroeth, sodium chloride, sodium chloride flush, sodium chloride flush   Vital Signs    Vitals:   11/10/20 0700 11/10/20 0751 11/10/20 0800 11/10/20 0901  BP:   (!) 176/86 116/83  Pulse: 85  96 79  Resp: (!) 30  (!) 29 (!) 22  Temp:  98.8 F (37.1 C)    TempSrc:  Axillary    SpO2: 95%  98% 96%  Weight:      Height:        Intake/Output Summary (Last 24 hours) at 11/10/2020 0933 Last data filed at 11/10/2020 0900 Gross per 24 hour  Intake 2841.19 ml  Output 1946 ml  Net 895.19 ml   Last 3 Weights 11/10/2020 11/09/2020 11/08/2020  Weight (lbs) 174 lb 6.1 oz 177 lb 4 oz 177 lb 7.5 oz  Weight (kg) 79.1 kg 80.4 kg 80.5 kg      Telemetry    NSR - Personally Reviewed  ECG      Physical Exam   GEN: No acute distress. Awake, intubated   Neck: No JVD Cardiac: RRR, no murmurs, rubs, or gallops.  Respiratory: Coarse breath  sounds to auscultation bilaterally. GI: Soft, nontender, non-distended  MS: No edema; No deformity. Neuro:  Awake, eyes open Psych: Intubated  Labs    High Sensitivity Troponin:   Recent Labs  Lab 10/28/20 0629 10/28/20 1046 10/28/20 2040 10/29/20 2121 10/30/20 0548  TROPONINIHS 3,155* >27,000* >27,000* >27,000* >27,000*      Chemistry Recent Labs  Lab 11/08/20 0347 11/08/20 1706 11/09/20 0414 11/09/20 2142 11/10/20 0346  NA 134*   < > 137 134* 136  K 5.5*   < > 5.0 4.1 4.0  CL 101   < > 101 101 103  CO2 16*   < > 20* 20* 22  GLUCOSE 89   < > 72 95 89  BUN 112*   < > 64* 42* 38*  CREATININE 7.43*   < > 5.03* 4.37* 3.99*  CALCIUM 9.4   < > 9.9 9.2 9.5  PROT 5.8*  --   --   --   --   ALBUMIN 1.3*   < > 1.3* 1.2* 1.2*  AST  53*  --   --   --   --   ALT 5  --   --   --   --   ALKPHOS 137*  --   --   --   --   BILITOT 1.9*  --   --   --   --   GFRNONAA 8*   < > 13* 15* 17*  ANIONGAP 17*   < > 16* 13 11   < > = values in this interval not displayed.     Hematology Recent Labs  Lab 11/09/20 0414 11/09/20 2140 11/10/20 0346  WBC 14.0* 13.5* 11.7*  RBC 2.84* 3.16* 3.39*  HGB 8.9* 9.4* 10.2*  HCT 26.2* 29.0* 30.9*  MCV 92.3 91.8 91.2  MCH 31.3 29.7 30.1  MCHC 34.0 32.4 33.0  RDW 19.8* 18.9* 19.0*  PLT 186 172 175    BNPNo results for input(s): BNP, PROBNP in the last 168 hours.   DDimer No results for input(s): DDIMER in the last 168 hours.   Radiology    DG Abd 1 View  Result Date: 11/09/2020 CLINICAL DATA:  Ileus. EXAM: ABDOMEN - 1 VIEW COMPARISON:  November 08, 2020. FINDINGS: The bowel gas pattern is normal. Distal tip of nasogastric tube is seen in expected position of distal stomach. Feeding tube is seen looped within the stomach with distal tip in the proximal stomach. Gastric distention noted on prior exam has resolved. Residual contrast and stool is noted throughout the colon. No radio-opaque calculi or other significant radiographic abnormality are seen. IMPRESSION: No evidence of bowel obstruction or ileus. Feeding tube tip is seen in proximal stomach. Gastric distention noted on prior exam has resolved. Electronically Signed   By: Marijo Conception M.D.   On: 11/09/2020 11:12   DG Abd Portable 1V  Result Date: 11/08/2020 CLINICAL DATA:  Feeding tube placement and orogastric tube placement EXAM: PORTABLE ABDOMEN - 1 VIEW COMPARISON:  Portable exam 1217 hours compared to 11/07/2020 FINDINGS: Central venous catheter tip projects over RIGHT atrium. Feeding tube coiled in stomach. Nasogastric tube tip in stomach. Gaseous distention of stomach new since prior exam. Scattered residual contrast throughout colon. Rounded calcification projects over LEFT kidney corresponding to a calcified cyst on a prior  CT. Osseous structures unremarkable. IMPRESSION: Nasogastric tube and coiled feeding tube both located within a stomach distended by gas. Electronically Signed   By: Lavonia Dana M.D.   On: 11/08/2020 12:34  Cardiac Studies     Patient Profile     59 y.o. male with recent anterior STEMI, CAD.   Assessment & Plan    1) CAD: Continue IV cangrelor for antiplatelet.  Change back to Brilinta when tolerating PO.  I agree with holding IV heparin given GI bleed issues.  Concern for ischemic bowel noted on the EGD.  Plan is for PCI when other medical issues stabilize.   2) ESRD: Dialysis per renal.   3) AFib: IV amio to maintain NSR.   Findings discussed with the patient's family at bedside.   For questions or updates, please contact [place] Please consult [email or web address] for contact info under        Signed, Larae Grooms, MD  11/10/2020, 9:33 AM

## 2020-11-10 NOTE — Procedures (Signed)
Extubation Procedure Note  Patient Details:   Name: Travis [name] DOB: 1961-07-06 MRN: [id number]   Airway Documentation:    Vent end date: 11/10/20 Vent end time: 1125   Evaluation  O2 sats: stable throughout Complications: No apparent complications Patient did tolerate procedure well. Bilateral Breath Sounds: Clear, Diminished   Yes   Pt was extubated to 4 L [place] without any complications. Positive cuff leak prior to extubation. Pt was instructed on IS.  [name] [name] 11/10/2020, 1125 am

## 2020-11-10 NOTE — Progress Notes (Signed)
Bp 207/82, repositioning patient. Agitated, restless.

## 2020-11-10 NOTE — Progress Notes (Signed)
Pt remains agitated. See EMAR for PRN's

## 2020-11-10 NOTE — Progress Notes (Signed)
[name]  Date of Admission:  10/28/2020           Current antibiotics: Total days of antibiotics: 12                                                                            Current antibiotics: 11/22-c cefazolin  11/21 vanc/piptazo  Reason for visit: Follow up on MSSA bacteremia  ASSESSMENT/PLAN:    Travis [name] is a 59 y.o. male with past medical history of hypertension, end-stage renal disease on intermittent hemodialysis, coronary artery disease, admitted with sepsis and anterior wall STEMI status post emergent PCI.  Found to have septic shock post procedure and MSSA bacteremia.  Repeat blood cultures are negative and transesophageal echo without definite evidence of vegetations (AV echodensities noted that could be consistent with calcification in a patient with end-stage renal disease).  CT chest from 11/23 with findings of pneumonia and sputum cultures with Staph aureus.  [name] 11/27 without abscess at AV fistula site.   CT abd/pelvis 11/24 without significant findings for infection.  MRI brain and spine 11/29 negative.  Repeat CT abd/pelvis 11/30 unremarkable for source.  Interval 24hrs: -- s/p EGD  #MSSA bacteremia #Pneumonia #ESRD #STEMI status post PCI #Staph epi positive cultures: recent blood cultures, while growing from both sites, were drawn from the same site as it appears in the computer making it more c/w contaminant. WBC continues to improve and did prior to vancomycin dose.  --Continue renally dosed cefazolin --Follow up pending BCx --Plan for 6 weeks antibiotics from date of femoral line removal.  End date = 12/19/20 --Will sign off for now , please call back as needed.    MEDICATIONS:    Scheduled Meds:  sodium chloride   Intravenous Once   aspirin  81 mg Per Tube Daily   atorvastatin  80 mg Per Tube q1800   B-complex with vitamin C  1 tablet Per Tube Daily   bisacodyl  10 mg Rectal BID   chlorhexidine  gluconate (MEDLINE KIT)  15 mL Mouth Rinse BID   Chlorhexidine Gluconate Cloth  6 each Topical Q0600   clonazepam  1 mg Per Tube BID   docusate  100 mg Per Tube BID   doxercalciferol  5 mcg Intravenous Q M,W,F   insulin aspart  0-6 Units Subcutaneous Q4H   lidocaine  1 patch Transdermal Q24H   mouth rinse  15 mL Mouth Rinse 10 times per day   metoCLOPramide (REGLAN) injection  10 mg Intravenous Q12H   pantoprazole (PROTONIX) IV  40 mg Intravenous Q12H   polyethylene glycol  17 g Per Tube Daily   QUEtiapine  100 mg Per Tube BID   senna  1 tablet Per Tube QHS   sevelamer carbonate  0.8 g Per Tube TID   sodium chloride flush  10-40 mL Intracatheter Q12H   sodium chloride flush  3 mL Intravenous Q12H    Continuous Infusions:   prismasol BGK 4/2.5 400 mL/hr at 11/08/20 1700    prismasol BGK 4/2.5 200 mL/hr at 11/10/20 0226   sodium chloride 10 mL/hr at 11/10/20 0900   sodium chloride Stopped (10/28/20 0954)   sodium chloride  Stopped (11/04/20 0016)   sodium chloride     sodium chloride     amiodarone 30 mg/hr (11/10/20 0900)   cangrelor 50 mg in NS 250 mL 0.75 mcg/kg/min (11/10/20 0900)    ceFAZolin (ANCEF) IV Stopped (11/09/20 2223)   dexmedetomidine (PRECEDEX) IV infusion 1 mcg/kg/hr (11/10/20 0900)   epinephrine Stopped (11/09/20 0304)   norepinephrine (LEVOPHED) Adult infusion Stopped (11/10/20 0555)   prismasol BGK 4/2.5 1,800 mL/hr at 11/10/20 0710   propofol (DIPRIVAN) infusion Stopped (11/09/20 1520)   vasopressin Stopped (11/09/20 1708)    PRN Meds: sodium chloride, sodium chloride, sodium chloride, acetaminophen, alteplase, bisacodyl, fentaNYL (SUBLIMAZE) injection, fentaNYL (SUBLIMAZE) injection, heparin, lidocaine (PF), lidocaine-prilocaine, LORazepam, midazolam, nitroGLYCERIN, ondansetron (ZOFRAN) IV, pentafluoroprop-tetrafluoroeth, sodium chloride, sodium chloride flush, sodium chloride flush  SUBJECTIVE:   Unable to obtain due to  being intubated.     OBJECTIVE:   Allergies  Allergen Reactions   Ibuprofen Hives   Lisinopril Swelling    PT states he is allergic to all prils; caused facial swelling   Naproxen Hives and Other (See Comments)    Alleve causes patient to have hives    Blood pressure 116/83, pulse 79, temperature 98.8 F (37.1 C), temperature source Axillary, resp. rate (!) 22, height 5' 11"  (1.803 m), weight 79.1 kg, SpO2 96 %. Body mass index is 24.32 kg/m.  Physical Exam  General: Patient is currently intubated on the ventilator. Wife is present.  Eyes open.  Weaning on ventilator and more alert today than prior days Head: Normocephalic and atraumatic.  ET tube in place. Heart: RRR Pulmonary/Chest: Breathing assisted on vent; symmetric chest rise and fall. Musculoskeletal: No joint deformities Neurological: Sedated Skin: Warm, dry and intact. No rashes or erythema.  HD cath Right IJ.  Left CVC  Lab Results & Microbiology Lab Results  Component Value Date   WBC 11.7 (H) 11/10/2020   HGB 10.2 (L) 11/10/2020   HCT 30.9 (L) 11/10/2020   MCV 91.2 11/10/2020   PLT 175 11/10/2020    Lab Results  Component Value Date   NA 136 11/10/2020   K 4.0 11/10/2020   CO2 22 11/10/2020   GLUCOSE 89 11/10/2020   BUN 38 (H) 11/10/2020   CREATININE 3.99 (H) 11/10/2020   CALCIUM 9.5 11/10/2020   GFRNONAA 17 (L) 11/10/2020   GFRAA 6 (L) 02/26/2018    Lab Results  Component Value Date   ALT 5 11/08/2020   AST 53 (H) 11/08/2020   ALKPHOS 137 (H) 11/08/2020   BILITOT 1.9 (H) 11/08/2020     I have reviewed the micro and lab results in Epic.  Imaging DG Abd 1 View  Result Date: 11/09/2020 CLINICAL DATA:  Ileus. EXAM: ABDOMEN - 1 VIEW COMPARISON:  November 08, 2020. FINDINGS: The bowel gas pattern is normal. Distal tip of nasogastric tube is seen in expected position of distal stomach. Feeding tube is seen looped within the stomach with distal tip in the proximal stomach. Gastric distention  noted on prior exam has resolved. Residual contrast and stool is noted throughout the colon. No radio-opaque calculi or other significant radiographic abnormality are seen. IMPRESSION: No evidence of bowel obstruction or ileus. Feeding tube tip is seen in proximal stomach. Gastric distention noted on prior exam has resolved. Electronically Signed   By: Marijo Conception M.D.   On: 11/09/2020 11:12   DG Abd Portable 1V  Result Date: 11/08/2020 CLINICAL DATA:  Feeding tube placement and orogastric tube placement EXAM: PORTABLE ABDOMEN - 1 VIEW COMPARISON:  Portable exam 1217 hours compared to 11/07/2020 FINDINGS: Central venous catheter tip projects over RIGHT atrium. Feeding tube coiled in stomach. Nasogastric tube tip in stomach. Gaseous distention of stomach new since prior exam. Scattered residual contrast throughout colon. Rounded calcification projects over LEFT kidney corresponding to a calcified cyst on a prior CT. Osseous structures unremarkable. IMPRESSION: Nasogastric tube and coiled feeding tube both located within a stomach distended by gas. Electronically Signed   By: Lavonia Dana M.D.   On: 11/08/2020 12:34      [facility] [phone] pager 11/10/2020, 9:43 AM   I have spent a total of 25 minutes with the patient reviewing hospital notes,  test results, labs and examining the patient as well as establishing an assessment and plan.

## 2020-11-11 DIAGNOSIS — I2109 ST elevation (STEMI) myocardial infarction involving other coronary artery of anterior wall: Secondary | ICD-10-CM | POA: Diagnosis not present

## 2020-11-11 LAB — CBC
HCT: 31.5 % — ABNORMAL LOW (ref 39.0–52.0)
Hemoglobin: 10.2 g/dL — ABNORMAL LOW (ref 13.0–17.0)
MCH: 29.6 pg (ref 26.0–34.0)
MCHC: 32.4 g/dL (ref 30.0–36.0)
MCV: 91.3 fL (ref 80.0–100.0)
Platelets: 217 10*3/uL (ref 150–400)
RBC: 3.45 MIL/uL — ABNORMAL LOW (ref 4.22–5.81)
RDW: 18.5 % — ABNORMAL HIGH (ref 11.5–15.5)
WBC: 12.3 10*3/uL — ABNORMAL HIGH (ref 4.0–10.5)
nRBC: 0.2 % (ref 0.0–0.2)

## 2020-11-11 LAB — GLUCOSE, CAPILLARY
Glucose-Capillary: 105 mg/dL — ABNORMAL HIGH (ref 70–99)
Glucose-Capillary: 78 mg/dL (ref 70–99)
Glucose-Capillary: 83 mg/dL (ref 70–99)
Glucose-Capillary: 85 mg/dL (ref 70–99)
Glucose-Capillary: 87 mg/dL (ref 70–99)
Glucose-Capillary: 90 mg/dL (ref 70–99)

## 2020-11-11 LAB — RENAL FUNCTION PANEL
Albumin: 1.2 g/dL — ABNORMAL LOW (ref 3.5–5.0)
Albumin: 1.3 g/dL — ABNORMAL LOW (ref 3.5–5.0)
Anion gap: 13 (ref 5–15)
Anion gap: 11 (ref 5–15)
BUN: 35 mg/dL — ABNORMAL HIGH (ref 6–20)
BUN: 35 mg/dL — ABNORMAL HIGH (ref 6–20)
CO2: 22 mmol/L (ref 22–32)
CO2: 22 mmol/L (ref 22–32)
Calcium: 9.3 mg/dL (ref 8.9–10.3)
Calcium: 9.1 mg/dL (ref 8.9–10.3)
Chloride: 102 mmol/L (ref 98–111)
Chloride: 102 mmol/L (ref 98–111)
Creatinine, Ser: 3.48 mg/dL — ABNORMAL HIGH (ref 0.61–1.24)
Creatinine, Ser: 3.95 mg/dL — ABNORMAL HIGH (ref 0.61–1.24)
GFR, Estimated: 20 mL/min — ABNORMAL LOW (ref >60)
GFR, Estimated: 17 mL/min — ABNORMAL LOW (ref >60)
Glucose, Bld: 94 mg/dL (ref 70–99)
Glucose, Bld: 90 mg/dL (ref 70–99)
Phosphorus: 2.6 mg/dL (ref 2.5–4.6)
Phosphorus: 2.9 mg/dL (ref 2.5–4.6)
Potassium: 4 mmol/L (ref 3.5–5.1)
Potassium: 4.2 mmol/L (ref 3.5–5.1)
Sodium: 137 mmol/L (ref 135–145)
Sodium: 135 mmol/L (ref 135–145)

## 2020-11-11 LAB — MAGNESIUM: Magnesium: 2.4 mg/dL (ref 1.7–2.4)

## 2020-11-11 LAB — HEPARIN LEVEL (UNFRACTIONATED): Heparin Unfractionated: <0.1 IU/mL — ABNORMAL LOW (ref 0.30–0.70)

## 2020-11-11 MED ORDER — CLONIDINE HCL 0.2 MG/24HR TD PTWK
0.2000 mg | MEDICATED_PATCH | TRANSDERMAL | Status: DC
  Administered 2020-11-11: 0.2 mg via TRANSDERMAL
  Filled 2020-11-11: qty 1

## 2020-11-11 MED ORDER — HEPARIN (PORCINE) 25000 UT/250ML-% IV SOLN
2200.0000 [IU]/h | INTRAVENOUS | Status: DC
  Administered 2020-11-11: 1650 [IU]/h via INTRAVENOUS
  Administered 2020-11-12: 1950 [IU]/h via INTRAVENOUS
  Administered 2020-11-12: 1800 [IU]/h via INTRAVENOUS
  Filled 2020-11-11 (×3): qty 250

## 2020-11-11 MED ORDER — CLONAZEPAM 0.5 MG PO TBDP
1.0000 mg | ORAL_TABLET | Freq: Two times a day (BID) | ORAL | Status: DC
  Administered 2020-11-13: 1 mg via ORAL
  Filled 2020-11-11 (×2): qty 2

## 2020-11-11 MED ORDER — HALOPERIDOL LACTATE 5 MG/ML IJ SOLN
5.0000 mg | Freq: Four times a day (QID) | INTRAMUSCULAR | Status: DC | PRN
  Administered 2020-11-11 – 2020-11-12 (×4): 5 mg via INTRAVENOUS
  Filled 2020-11-11 (×5): qty 1

## 2020-11-11 MED ORDER — DEXTROSE 5 % IV SOLN: INTRAVENOUS | Status: DC

## 2020-11-11 NOTE — Progress Notes (Signed)
NAME:  Travis [name], MRN:  [id number], DOB:  01/23/1961, LOS: 53 ADMISSION DATE:  10/28/2020, CONSULTATION DATE:  10/29/20 REFERRING MD:  Cardiology - [name], CHIEF COMPLAINT:  Hypotension, gram positive cocci on culture, AMS  Brief History   Travis [name] is a 59 year old man with a history of HTN, ESRD on HD MWF, RUE AV fistula, hx of smoking, admitted 10/28/20 with fevers, myalgias for several days, acute chest pain.    History of present illness   On Admission, found to have STEMI, multivessel disease. Pulm edema with mild hypoxemia.  S/Post PCI to the LAD on 10/29/19. Planning for staged intervention to the left circumflex and possibly right coronary artery.  EF 89-21%, diastolic dysfunction, LVEDP 35. Underwent HD on 11/20 with 4 L volume removed.  Saturation improved after dialysis (100% on RA) Fevers noted 11/20. Blood cultures grew MSSA, noted this evening. .    This evening developed Afib with RVR 180s. Adenosine given, without improvement  Amiodarone infusion started per cardiology, 150mg  bolus, then drip.  Lopressor 5mg IV and 500ccNS given. Started on Phenylephrine, was on 245mcg on my arrival.   Cardioverted emergently at 120J for ongoing afib with hypotension (60/40) and AMS.  Converted to sinus.  Remained moderately hypotensive (MAP 60) on phenylephrine.   Patient was given one dose zosyn, switched to ancef once cultures grew back MSSA.   Cardiac stress tests annually, last done 11/16/19.    Past Medical History  HTN ESRD, HD MWF  Significant Hospital Events   Cardioversion 11/21 Cardiac cath stent to LAD 11/20  Consults:  PCCM  Nephrology ID  Procedures:  Central line 11/21 >> 11/29 Arterial line 11/21 Repeat DCCV 11/23 11/23 ETT HD line 11/24 Arterial line 11/24 L [place] CVC 11/30>>   Significant Diagnostic Tests:  11/24 TEE>>Left ventricular ejection fraction, by estimation, is 35 to 40%. The  left ventricle has moderately decreased function.Moderate to  severe mitral regurgitation. 11/23 CT Chest>>Extensive multifocal nodular and patchy airspace disease in both lungs with a slight peripheral predominance in the upper lungs. 3.1 cm nodular consolidative opacity in the right upper lobe is cavitated. Imaging features likely related to multifocal pneumonia. Septic emboli and metastatic disease considered less likely but not excluded. 11/22 LUE Vas Upper Extremity Doppler>> Arteriovenous fistula-Aneurysmal dilatation noted. 11/24 CT abdomen - no retroperitoneal hematoma 11/29 MR Brain>> No evidence acute intracranial abnormality, sinisitis, mastoid effusions 11/29>> MR Cervical Spine>> Given the provided history of bacteremia, facet joint septic arthritis is difficult to definitively exclude, but the lack of any surrounding marrow edema argues against this Cervical spondylosis  11/29 MR Thoracic Spine No significant marrow edema in thoracic spine , Mild thoracic spondylosis hypointense marrow signal throughout the thoracic spine, likely related to the patient's end-stage renal disease. multifocal airspace disease and cavitary pulmonary lesions. Small right pleural effusion. 11/29 MR Lumbar Spine:As noted above and Suspected cholecystolithiasis  Micro Data:  11/21 BCx2>>Staph aureus > MSSA by BCID 11/24 BCx2>> 11/30 BC x 2>> staph epi>>  Antimicrobials:  Zosyn 11/21 x 1 Ancef 11/21 ->  Interim history/subjective:  Extubated. Attempts at cortrak yesterday failed.  Objective   Blood pressure (!) 182/77, pulse 96, temperature 99.2 F (37.3 C), temperature source Oral, resp. rate (!) 31, height 5\' 11"  (1.803 m), weight 78.8 kg, SpO2 92 %. CVP:  [2 mmHg-12 mmHg] 8 mmHg  Vent Mode: PRVC FiO2 (%):  [36 %-60 %] 36 % Set Rate:  [16 bmp] 16 bmp Vt Set:  [600 mL] 600 mL  PEEP:  [5 cmH20] 5 cmH20   Intake/Output Summary (Last 24 hours) at 11/11/2020 8333 Last data filed at 11/11/2020 0900 Gross per 24 hour  Intake 2080.53 ml  Output 2156 ml   Net -75.47 ml   Filed Weights   11/09/20 0500 11/10/20 0500 11/11/20 0500  Weight: 80.4 kg 79.1 kg 78.8 kg    Constitutional: no acute distress  Eyes: EOMI, pupils equal Ears, nose, mouth, and throat: MM dry, trachea midline Cardiovascular: RRR, ext warm Respiratory: Scattered rhonci, no accessory muscle use Gastrointestinal: soft, +BS Skin: No rashes, normal turgor Neurologic: moves all 4 ext to command Psychiatric: very confused and paranoid   Sugars low Chemistries okay CBC looks good Net even  Resolved Hospital Problem list     Assessment & Plan:   Anterior STEMI, CAD, acute CHF exacerbation- will need definitive PCI once recovers Afib s/p amio now sinus rhythm MSSA bacteremia, source thought HD site on cefazolin ESRD on HD UGIB- secondary to severe esophagitis and duodenal ulcer question ischemic s/p EGD on 11/09/20 Constipation- resolved Acute toxic metabolic encephalopathy, ICU delirium- still an issue Acute hypoxemic respiratory failure- off vent, still having issues with toileting  - Encourage IS, flutter, hopefully can get off CRRT so he can mobilize more - Wean precedex, start clonidine patch, stop midodrine - Stop benzo pushes, PRN haldol, watch QTc - Continue prolonged course of abx - Bedside swallow, if fails will need SLP eval; if fails SLP eval will need IR-guided NGT vs. TPN - ?back to iHD - Continue PPI, watch H/H - Continue cangrelor, defer AC resumption to primary  Best practice:  Diet: NPO Pain/Anxiety/Delirium protocol (if indicated): off VAP protocol (if indicated): off DVT prophylaxis: on heparin gtt GI prophylaxis: protonix Glucose control: supplemental IV glucose being given Mobility: Bedrest Code Status: Full  Family Communication: Spoke with wife at bedside 12/4 Disposition: ICU  Patient critically ill due to metabolic encephalopathy, renal failure Interventions to address this today weaning CRRT, weaning precedex Risk of  deterioration without these interventions is high  I personally spent 38 minutes providing critical care not including any separately billable procedures  Erskine Emery MD [facility] 11/11/2020 9:23 AM Personal pager: [phone] If unanswered, please page CCM On-call: [phone]

## 2020-11-11 NOTE — Progress Notes (Signed)
ANTICOAGULATION CONSULT NOTE  Pharmacy Consult for: Restart Heparin Indication: chest pain/ACS with no intervention and Afib   Patient Measurements: Height: 5\' 11"  (180.3 cm) Weight: 78.8 kg (173 lb 11.6 oz) IBW/kg (Calculated) : 75.3 Heparin Dosing Weight: 86.4 kg   Vital Signs: Temp: 100.7 F (38.2 C) (12/04 1500) Temp Source: Oral (12/04 1500) BP: 138/82 (12/04 1930) Pulse Rate: 119 (12/04 1945)  Labs: Recent Labs    11/09/20 2140 11/09/20 2142 11/10/20 0346 11/10/20 0346 11/10/20 1656 11/11/20 0303 11/11/20 1728  HGB 9.4*   < > 10.2*  --   --  10.2*  --   HCT 29.0*  --  30.9*  --   --  31.5*  --   PLT 172  --  175  --   --  217  --   HEPARINUNFRC  --   --   --   --   --   --  <0.10*  CREATININE  --    < > 3.99*   < > 3.48* 3.48* 3.95*   < > = values in this interval not displayed.    Estimated Creatinine Clearance: 21.7 mL/min (A) (by C-G formula based on SCr of 3.95 mg/dL (H)).  Assessment: 35 yom presenting with STEMI. Known to have dilated ascending aorta, ESRD. Found to have 99% mid LAD with multivessel in LCx and RCA, now s/p DES to LAD. Heparin was started post sheath removal. Patient was found to have bacteremia and will not proceed with cath intervention at this time. Patient developed Afib during admission treated with amiodarone and successful cardioversion, but has had episodes of Afib post cardioversion. Currently patient is in NSR. Patient has had unstable Hgb through admission requiring transfusion.   Heparin was held 12/1 for GI workup/bleeding.  Now stabilized, Hgb stable at 10.2.  Discussed with Drs. Tamala Julian and Caryl Comes - okay to restart heparin today with lower heparin level goal.  Heparin level this evening is SUBtherapeutic (HL<0.1) - odd since the patient was detectable at lower rates earlier this admission. Night shift checked site and infusing appropriately. Looks like day-shift RN paused drip for ~30 minutes around lab draw so will hold off on  re-draw for now. Will increase but not aggressively and follow-up with a HL in the AM.   Goal of Therapy:  Heparin level 0.3-0.5 units/mL while on Cangrelor  Monitor platelets by anticoagulation protocol: Yes   Plan:  - Increase Heparin to 1800 units/hr (18 ml/hr) - Will continue to monitor for any signs/symptoms of bleeding and will follow up with heparin level in 8 hours   Thank you for allowing pharmacy to be a part of this patient's care.  Alycia Rossetti, PharmD, BCPS Clinical Pharmacist Clinical phone for 11/11/2020: [phone] 11/11/2020 8:18 PM   **Pharmacist phone directory can now be found on [email or web address] ([email or web address]).  Listed under [place].

## 2020-11-11 NOTE — Evaluation (Signed)
Clinical/Bedside Swallow Evaluation Patient Details  Name: Travis [name] MRN: [id number] Date of Birth: 20-Jul-1961  Today's Date: 11/11/2020 Time: SLP Start Time (ACUTE ONLY): 2202 SLP Stop Time (ACUTE ONLY): 1422 SLP Time Calculation (min) (ACUTE ONLY): 17 min  Past Medical History:  Past Medical History:  Diagnosis Date  . Acute ST elevation myocardial infarction (STEMI) of anterolateral wall ([place]) 10/28/2020   Culprit lesion, 99% ulcerated mid LAD at D1 -> DES PCI  . Anemia of chronic disease 01/30/2015  . Coronary artery disease involving native coronary artery of native heart with unstable angina pectoris ([place]) 10/28/2020   Acute anterolateral STEMI: 3V CAD: Culprit Lesion = 99% mid LAD lesion (just after D1) - DES PCI; 99% ostial AV groove LCx; & 80% calcified napkin ring proximal RCA lesion with extensive calcification throughout the RCA. Successful PTCA and DES PCI of the LAD crossing D1 -resolute Onyx DES 2.75 mm x 18 mm postdilated to 3.1 mm. With PCI, there  . End-stage renal disease on hemodialysis ([address])   . GERD (gastroesophageal reflux disease)    takes Nexium  . Hyperlipidemia with target LDL less than 70 10/28/2020  . Hypertension   . Pneumonia    as a child  . Presence of drug coated stent in LAD coronary artery 10/28/2020   Proximal-mid LAD 99% ->0% -> Resolute Onyx DES 2.75 mm x 18 mm (3.1 mm)  . Sleep apnea    uses c-pap 2 yrs   Past Surgical History:  Past Surgical History:  Procedure Laterality Date  . AV FISTULA PLACEMENT Left 12/22/2017   Procedure: REPAIR PSEUDOANEURYSM ARTERIOVENOUS (AV) FISTULA;  Surgeon: [name], MD;  Location: [place];  Service: Vascular;  Laterality: Left;  . BASCILIC VEIN TRANSPOSITION Left 01/05/2014   Procedure: LEFT 1ST STAGE BASCILIC VEIN TRANSPOSITION;  Surgeon: Conrad Calverton, MD;  Location: [place];  Service: Vascular;  Laterality: Left;  . BASCILIC VEIN TRANSPOSITION Left 07/26/2014   Procedure: Left Arm Brachial Vein  Transposition Second Stage;  Surgeon: Conrad Crocker, MD;  Location: [place];  Service: Vascular;  Laterality: Left;  . COLONOSCOPY    . COLONOSCOPY N/A 02/02/2015   Procedure: COLONOSCOPY;  Surgeon: [name], MD;  Location: [facility] ENDOSCOPY;  Service: Endoscopy;  Laterality: N/A;  . CORONARY STENT INTERVENTION N/A 10/28/2020   Procedure: CORONARY STENT INTERVENTION;  Surgeon: Leonie Man, MD;  Location: [place] CV LAB;  Service: Cardiovascular;  Laterality: N/A;  . CORONARY/GRAFT ACUTE MI REVASCULARIZATION N/A 10/28/2020   Procedure: Coronary/Graft Acute MI Revascularization;  Surgeon: Leonie Man, MD;  Location: [place] [facility];  Service: Cardiovascular;  Laterality: N/A;  . ESOPHAGOGASTRODUODENOSCOPY Left 01/31/2015   Procedure: ESOPHAGOGASTRODUODENOSCOPY (EGD);  Surgeon: [name], MD;  Location[facility] END[facility]COPY;  Service: Endoscopy;  Laterality: Left;  . EVALUATION UNDER ANESTHESIA WITH HEMORRHOIDECTOMY N/A 02/26/2018   Procedure: ANORECTAL EXAM UNDER ANESTHESIA  HEMORRHOIDECTOMY x 2  HEMORRHOIDAL LIGATION AND PEXY;  Surgeon: Aram Boston, MD;  Location: [facility];  Service: General;  Laterality: N/A;  . GIVENS CAPSULE STUDY N/A 01/31/2015   Procedure: GIVENS CAPSULE STUDY;  Surgeon: [name], MD;  Location: [facility] ENDOSCOPY;  Service: Endoscopy;  Laterality: N/A;  . LEFT HEART CATH AND CORONARY ANGIOGRAPHY N/A 10/28/2020   Procedure: LEFT HEART CATH AND CORONARY ANGIOGRAPHY;  Surgeon: Leonie Man, MD;  Location: [place] CV LAB;  Service: Cardiovascular;  Laterality: N/A;  . MOUTH SURGERY     teeth cleaning  . 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 He describes his chest tightness, 7/10, with radiation to his back (between his scapula), he has not  exerted. EKG in ED is suggestive of anterolateral ST elevations. In the ED he was given thorazine for hiccups; CXR 10/31/20 indicated New patchy right mid and lower lung infiltrate; CT chest 10/31/20 yielded results including: Extensive multifocal nodular and patchy airspace disease in both lungs with a slight peripheral predominance in the upper lungs. 3.1 cm nodular consolidative opacity in the right upper lobe is cavitated. Imaging features likely related to multifocal pneumonia. Pt intubated 11/23-12/3/21; gastroenterology consult 11/09/20 with UGI completed and results as follows: Moderately severe reflux and erosive esophagitis  with no bleeding; Hematin (altered blood/coffee-ground-like material) in the gastric antrum, in the cardia, in  the gastric fundus and in the gastric body. Fluid aspiration performed. Probable ischemic duodenitis. Normal third portion of the duodenum. The examination was otherwise normal. BSE generated.  Assessment / Plan / Recommendation Clinical Impression  Pt seen for limited BSE with oral care completed prior to examination.  Pt eager to "drink water" when SLP arrived, despite being given Ativan prior to assessment.  Pt with poor awareness of bolus and limited bolus manipulation with ice chips/thin liquids.  He exhibited a weak volitional cough prior to examination, but during exam, a strong, reflexive delayed cough observed after intake of thin liquids.  Pt's mentation, length of intubation and s/s of aspiration place him at a moderate risk for aspiration and inadequate nutrition/hydration.  Recommend NPO with another form of nutrition/hydration for present time.  ST will f/u for PO readiness.  Thank you for this consult. 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 He describes his chest tightness, 7/10, with radiation to his back (between his scapula), he has not exerted. EKG in ED is suggestive of anterolateral ST elevations. COVID pending. In the ED he was given ASA 324mg  x1, SL nitro and thorazine for hickups.  Type of Study: Bedside Swallow Evaluation Previous Swallow Assessment: Passed [facility] on 10/28/20 Diet Prior to this Study: NPO Temperature Spikes Noted: No Respiratory Status: Nasal cannula History of Recent Intubation: Yes Length of Intubations (days): 10 days Date extubated: 11/10/20 Behavior/Cognition: Confused;Impulsive;Lethargic/Drowsy;Requires cueing Oral Cavity Assessment: Within Functional Limits Oral Care Completed by SLP: Recent completion by staff Oral Cavity - Dentition: Adequate natural dentition Self-Feeding Abilities: Able to feed self;Needs assist Patient Positioning: Upright in bed Baseline Vocal Quality: Breathy;Low vocal intensity Volitional Cough: Weak Volitional Swallow: Able to elicit    Oral/Motor/Sensory Function Overall Oral Motor/Sensory Function: Generalized oral weakness  Ice Chips Ice chips: Impaired Presentation: Spoon Oral Phase Impairments: Reduced lingual movement/coordination;Poor awareness of bolus Oral Phase Functional Implications: Prolonged oral transit;Oral holding Pharyngeal Phase Impairments: Suspected delayed Swallow   Thin Liquid Thin Liquid: Impaired Presentation: Spoon Oral Phase Impairments: Reduced lingual  movement/coordination;Poor awareness of bolus Oral Phase Functional Implications: Prolonged oral transit;Oral holding Pharyngeal  Phase Impairments: Suspected delayed Swallow;Cough - Immediate;Cough - Delayed    Nectar Thick Nectar Thick Liquid: Not tested   Honey Thick Honey Thick Liquid: Not tested   Puree Puree: Not tested   Solid     Solid: Not tested      Elvina Sidle, M.S., CCC-SLP 11/11/2020,4:27 PM

## 2020-11-11 NOTE — Progress Notes (Signed)
ANTICOAGULATION CONSULT NOTE  Pharmacy Consult for: Restart Heparin Indication: chest pain/ACS with no intervention and Afib   Patient Measurements: Height: 5\' 11"  (180.3 cm) Weight: 78.8 kg (173 lb 11.6 oz) IBW/kg (Calculated) : 75.3 Heparin Dosing Weight: 86.4 kg   Vital Signs: Temp: 99.2 F (37.3 C) (12/04 0400) Temp Source: Oral (12/04 0400) BP: 182/77 (12/04 0800) Pulse Rate: 95 (12/04 1000)  Labs: Recent Labs    11/08/20 1706 11/08/20 1810 11/09/20 2140 11/09/20 2142 11/10/20 0346 11/10/20 1656 11/11/20 0303  HGB  --    < > 9.4*   < > 10.2*  --  10.2*  HCT  --    < > 29.0*  --  30.9*  --  31.5*  PLT  --    < > 172  --  175  --  217  HEPARINUNFRC 0.54  --   --   --   --   --   --   CREATININE 6.94*   < >  --    < > 3.99* 3.48* 3.48*   < > = values in this interval not displayed.    Estimated Creatinine Clearance: 24.6 mL/min (A) (by C-G formula based on SCr of 3.48 mg/dL (H)).  Assessment: 38 yom presenting with STEMI. Known to have dilated ascending aorta, ESRD. Found to have 99% mid LAD with multivessel in LCx and RCA, now s/p DES to LAD. Heparin was started post sheath removal. Patient was found to have bacteremia and will not proceed with cath intervention at this time. Patient developed Afib during admission treated with amiodarone and successful cardioversion, but has had episodes of Afib post cardioversion. Currently patient is in NSR. Patient has had unstable Hgb through admission requiring transfusion.   Heparin was held 12/1 for GI workup/bleeding.  Now stabilized, Hgb stable at 10.2.  Discussed with Drs. Tamala Julian and Caryl Comes - okay to restart heparin today with lower heparin level goal.  Goal of Therapy:  Heparin level 0.3-0.5 units/mL while on Cangrelor  Monitor platelets by anticoagulation protocol: Yes   Plan:  Restart IV heparin at rate of 1650 units/hr. Check heparin level in 8 hrs. Daily heparin level and CBC. Watch for signs/symptoms of  bleeding.  [place] Crane, Roylene Reason, BCCP Clinical Pharmacist  11/11/2020 10:56 AM   [facility] pharmacy phone numbers are listed on [email or web address]

## 2020-11-11 NOTE — Progress Notes (Addendum)
[facility] Progress Note   Subjective: Seen in room. Extubated, tired, no c/o.   Objective Vitals:   11/11/20 0645 11/11/20 0700 11/11/20 0800 11/11/20 0900  BP:   (!) 182/77   Pulse: 89 96 100 96  Resp: (!) 30 (!) 24 (!) 32 (!) 31  Temp:      TempSrc:      SpO2: 93% 95% 94% 92%  Weight:      Height:       Physical Exam Gen: is extubated now, alert, very tired Heart:S1,S2 No M/R/G Lungs:Bilateral breath sounds, no wheezing, occ rhonchi Abd: soft ntnd no mass or ascites Extremities: No LE edema.BLE heel protectors.LUE 1-2+ edema Dialysis Access:L AVF + T/B Temp cath in place.Triple lumen CVL.   Home meds:  -  Labetalol 300 bid  - sensipar 120/ auryxia 3 ac  - prn's/ vitamins/ supplements   Additional Objective Labs: Basic Metabolic Panel: Recent Labs  Lab 11/10/20 0346 11/10/20 1656 11/11/20 0303  NA 136 137 137  K 4.0 4.1 4.0  CL 103 108 102  CO2 22 17* 22  GLUCOSE 89 57* 94  BUN 38* 37* 35*  CREATININE 3.99* 3.48* 3.48*  CALCIUM 9.5 7.8* 9.3  PHOS 3.8 3.2 2.6   Liver Function Tests: Recent Labs  Lab 11/08/20 0347 11/08/20 1706 11/10/20 0346 11/10/20 1656 11/11/20 0303  AST 53*  --   --   --   --   ALT 5  --   --   --   --   ALKPHOS 137*  --   --   --   --   BILITOT 1.9*  --   --   --   --   PROT 5.8*  --   --   --   --   ALBUMIN 1.3*   < > 1.2* 1.1* 1.2*   < > = values in this interval not displayed.   No results for input(s): LIPASE, AMYLASE in the last 168 hours. CBC: Recent Labs  Lab 11/08/20 1810 11/08/20 2342 11/09/20 0414 11/09/20 0414 11/09/20 2140 11/10/20 0346 11/11/20 0303  WBC 24.0*  --  14.0*   < > 13.5* 11.7* 12.3*  HGB 7.7*   < > 8.9*   < > 9.4* 10.2* 10.2*  HCT 24.3*   < > 26.2*   < > 29.0* 30.9* 31.5*  MCV 96.4  --  92.3  --  91.8 91.2 91.3  PLT 207  --  186   < > 172 175 217   < > = values in this interval not displayed.   Blood Culture    Component Value Date/Time   SDES BLOOD BLOOD  RIGHT HAND 11/09/2020 0554   SPECREQUEST  11/09/2020 0554    BOTTLES DRAWN AEROBIC AND ANAEROBIC Blood Culture adequate volume   CULT  11/09/2020 0554    NO GROWTH 2 DAYS Performed at [facility], [place] [address], [place], [place] [address]    REPTSTATUS PENDING 11/09/2020 3491    Cardiac Enzymes: No results for input(s): CKTOTAL, CKMB, CKMBINDEX, TROPONINI in the last 168 hours. CBG: Recent Labs  Lab 11/10/20 1847 11/10/20 2000 11/10/20 2337 11/11/20 0409 11/11/20 0836  GLUCAP 80 77 95 87 83   Iron Studies: No results for input(s): IRON, TIBC, TRANSFERRIN, FERRITIN in the last 72 hours. @lablastinr3 @ Studies/Results: DG Abd 1 View  Result Date: 11/09/2020 CLINICAL DATA:  Ileus. EXAM: ABDOMEN - 1 VIEW COMPARISON:  November 08, 2020. FINDINGS: The bowel gas pattern  is normal. Distal tip of nasogastric tube is seen in expected position of distal stomach. Feeding tube is seen looped within the stomach with distal tip in the proximal stomach. Gastric distention noted on prior exam has resolved. Residual contrast and stool is noted throughout the colon. No radio-opaque calculi or other significant radiographic abnormality are seen. IMPRESSION: No evidence of bowel obstruction or ileus. Feeding tube tip is seen in proximal stomach. Gastric distention noted on prior exam has resolved. Electronically Signed   By: Marijo Conception M.D.   On: 11/09/2020 11:12   Medications: .  prismasol BGK 4/2.5 400 mL/hr at 11/08/20 1700  .  prismasol BGK 4/2.5 200 mL/hr at 11/10/20 0226  . sodium chloride Stopped (11/10/20 1944)  . sodium chloride Stopped (10/28/20 0954)  . sodium chloride Stopped (11/04/20 0016)  . sodium chloride    . sodium chloride    . amiodarone 30 mg/hr (11/11/20 0900)  . cangrelor 50 mg in NS 250 mL 0.75 mcg/kg/min (11/11/20 0900)  .  ceFAZolin (ANCEF) IV Stopped (11/10/20 2204)  . dexmedetomidine (PRECEDEX) IV infusion 0.4 mcg/kg/hr (11/11/20 0900)  . dextrose    .  epinephrine Stopped (11/09/20 0304)  . norepinephrine (LEVOPHED) Adult infusion Stopped (11/10/20 0555)  . prismasol BGK 4/2.5 1,800 mL/hr (11/10/20 1559)  . propofol (DIPRIVAN) infusion Stopped (11/09/20 1520)  . vasopressin Stopped (11/09/20 1708)   . sodium chloride   Intravenous Once  . aspirin  81 mg Per Tube Daily  . atorvastatin  80 mg Per Tube q1800  . B-complex with vitamin C  1 tablet Per Tube Daily  . chlorhexidine gluconate (MEDLINE KIT)  15 mL Mouth Rinse BID  . Chlorhexidine Gluconate Cloth  6 each Topical Q0600  . clonazepam  1 mg Per Tube BID  . cloNIDine  0.2 mg Transdermal Weekly  . docusate  100 mg Per Tube BID  . doxercalciferol  5 mcg Intravenous Q M,W,F  . insulin aspart  0-6 Units Subcutaneous Q4H  . lidocaine  1 patch Transdermal Q24H  . mouth rinse  15 mL Mouth Rinse 10 times per day  . metoCLOPramide (REGLAN) injection  10 mg Intravenous Q12H  . pantoprazole (PROTONIX) IV  40 mg Intravenous Q12H  . polyethylene glycol  17 g Per Tube Daily  . QUEtiapine  100 mg Per Tube BID  . senna  1 tablet Per Tube QHS  . sevelamer carbonate  0.8 g Per Tube TID  . sodium chloride flush  10-40 mL Intracatheter Q12H  . sodium chloride flush  3 mL Intravenous Q12H     OP HD:AF MWF 4h 450/800 81.5kg 2/2.25 bath RUE AVF  ---Hep 5000units IV initial bolus+ 1000 units IVmidrun - hect 5 ug tiw  - mircera 50 ugIVq 4 weeks, last 11/15 (due 11/29)    Assessment/ Plan: 1. ESRD - usual HD MWF. ^^LVEDP per initial LHC.Started CRRT 11/24. Stopped 11/29 resumed 11/08/2020. Off pressors and extubated. Will dc CRRT today. Transition to iHD on Monday.  2. Acute STEMI- SP LAD PCI11/20 am, has otherdisease inLCx/ RCA.Transitioned to brilinta from cangrelor. Per cardiology. 3. MSSA cavitary PNA / bacteremia-intubated 11/23. Per PCCM. Respiratory culture 11/23 with MSSA. Blood cx +11/21.CT abd/ pelvis negative 11/23.F/U bcx's negative.Dialysis duplex  11/22 without abscessandUS of AVF wasnegative 11/27. Plan for 6 weeks of IV cefazolin per pharmacy ending 12/31/2019 . ID signed off. 4. PEA arrest: occurred in setting of agitation/ sedation.Brief CPR. 5. Vomiting-  On 11/30. CT abd was negative. SP EGD which  showed severe esophagitis and duodenal ulcers probably from ischemia. Per primary, GI.  6. Volume-appears euvolemic, local LUE edema, 2-3 kg under dry, CVP 1- 8 7. Shock - resolved 8. Anemia ckd -  S/P PRBCs. HGB 8s 9. MBD ckd -Orally intubated, binders on hold. 10. Afib:on amio and hep gtt, s/p DCCV.  10.Severe MR:per cardiology.  Kelly Splinter, MD 11/11/2020, 10:27 AM

## 2020-11-11 NOTE — Plan of Care (Signed)
  Problem: Cardiovascular: Goal: Ability to achieve and maintain adequate cardiovascular perfusion will improve Outcome: Progressing   Problem: Education: Goal: Knowledge of General Education information will improve Description: Including pain rating scale, medication(s)/side effects and non-pharmacologic comfort measures Outcome: Progressing   Problem: Clinical Measurements: Goal: Ability to maintain clinical measurements within normal limits will improve Outcome: Progressing Goal: Will remain free from infection Outcome: Progressing Goal: Diagnostic test results will improve Outcome: Progressing Goal: Respiratory complications will improve Outcome: Progressing Goal: Cardiovascular complication will be avoided Outcome: Progressing   Problem: Activity: Goal: Risk for activity intolerance will decrease Outcome: Progressing

## 2020-11-11 NOTE — Progress Notes (Signed)
Progress Note  Patient Name: Travis [name] Date of Encounter: 11/11/2020  [facility] Cardiologist: Glenetta Hew, MD  Patient Profile     59 y.o. male with ESRD on HD ( being considered for renal transplant at [facility]) , dilated aorta recent anterior STEMI, CAD.  Cath >> LAD99>>0; CX-99; RCA 80; EF 40-45% Echo EF 35-40%/ MR mod-sev  Course complicated by MSSA bacteremia shock and Staphylococcus pneumonia with cavitation requiring extubated 12/3  ; PEA arrest and recurrent afib requiring DCCV and AMIO, itself complicated by pressor dependent hypotension. Upper GI bleed prolapse change of Brilinta--IV Cangrelor to assure absorption Interval Hypertension   Subjective  No clear complaints but hard to understand   Inpatient Medications    Scheduled Meds: . sodium chloride   Intravenous Once  . aspirin  81 mg Per Tube Daily  . atorvastatin  80 mg Per Tube q1800  . B-complex with vitamin C  1 tablet Per Tube Daily  . chlorhexidine gluconate (MEDLINE KIT)  15 mL Mouth Rinse BID  . Chlorhexidine Gluconate Cloth  6 each Topical Q0600  . clonazepam  1 mg Per Tube BID  . cloNIDine  0.2 mg Transdermal Weekly  . docusate  100 mg Per Tube BID  . doxercalciferol  5 mcg Intravenous Q M,W,F  . insulin aspart  0-6 Units Subcutaneous Q4H  . lidocaine  1 patch Transdermal Q24H  . mouth rinse  15 mL Mouth Rinse 10 times per day  . metoCLOPramide (REGLAN) injection  10 mg Intravenous Q12H  . pantoprazole (PROTONIX) IV  40 mg Intravenous Q12H  . polyethylene glycol  17 g Per Tube Daily  . QUEtiapine  100 mg Per Tube BID  . senna  1 tablet Per Tube QHS  . sevelamer carbonate  0.8 g Per Tube TID  . sodium chloride flush  10-40 mL Intracatheter Q12H  . sodium chloride flush  3 mL Intravenous Q12H   Continuous Infusions: .  prismasol BGK 4/2.5 400 mL/hr at 11/08/20 1700  .  prismasol BGK 4/2.5 200 mL/hr at 11/10/20 0226  . sodium chloride Stopped (11/10/20 1944)  . sodium chloride Stopped  (10/28/20 0954)  . sodium chloride Stopped (11/04/20 0016)  . sodium chloride    . sodium chloride    . amiodarone 30 mg/hr (11/11/20 0900)  . cangrelor 50 mg in NS 250 mL 0.75 mcg/kg/min (11/11/20 0900)  .  ceFAZolin (ANCEF) IV Stopped (11/10/20 2204)  . dexmedetomidine (PRECEDEX) IV infusion 0.4 mcg/kg/hr (11/11/20 0900)  . dextrose 5 % and 0.45% NaCl 50 mL/hr at 11/11/20 0900  . epinephrine Stopped (11/09/20 0304)  . norepinephrine (LEVOPHED) Adult infusion Stopped (11/10/20 0555)  . prismasol BGK 4/2.5 1,800 mL/hr (11/10/20 1559)  . propofol (DIPRIVAN) infusion Stopped (11/09/20 1520)  . vasopressin Stopped (11/09/20 1708)   PRN Meds: sodium chloride, sodium chloride, sodium chloride, acetaminophen, alteplase, bisacodyl, fentaNYL (SUBLIMAZE) injection, fentaNYL (SUBLIMAZE) injection, haloperidol lactate, heparin, lidocaine (PF), lidocaine-prilocaine, LORazepam, nitroGLYCERIN, ondansetron (ZOFRAN) IV, pentafluoroprop-tetrafluoroeth, sodium chloride, sodium chloride flush, sodium chloride flush   Vital Signs    Vitals:   11/11/20 0645 11/11/20 0700 11/11/20 0800 11/11/20 0900  BP:   (!) 182/77   Pulse: 89 96 100 96  Resp: (!) 30 (!) 24 (!) 32 (!) 31  Temp:      TempSrc:      SpO2: 93% 95% 94% 92%  Weight:      Height:        Intake/Output Summary (Last 24 hours) at 11/11/2020 0945  Last data filed at 11/11/2020 0900 Gross per 24 hour  Intake 2080.53 ml  Output 2156 ml  Net -75.47 ml   Last 3 Weights 11/11/2020 11/10/2020 11/09/2020  Weight (lbs) 173 lb 11.6 oz 174 lb 6.1 oz 177 lb 4 oz  Weight (kg) 78.8 kg 79.1 kg 80.4 kg      Telemetry    NSR   ECG      Physical Exam   Well developed and nourished vocalizes but unable to understand On CRRT HENT normal Neck supple   Clear Regular rate and rhythm, no murmurs or gallops Abd-soft with active BS No Clubbing cyanosis edema Skin-warm and dry A  Grossly normal sensory and motor function     Labs    High  Sensitivity Troponin:   Recent Labs  Lab 10/28/20 0629 10/28/20 1046 10/28/20 2040 10/29/20 2121 10/30/20 0548  TROPONINIHS 3,155* >27,000* >27,000* >27,000* >27,000*      Chemistry Recent Labs  Lab 11/08/20 0347 11/08/20 1706 11/10/20 0346 11/10/20 1656 11/11/20 0303  NA 134*   < > 136 137 137  K 5.5*   < > 4.0 4.1 4.0  CL 101   < > 103 108 102  CO2 16*   < > 22 17* 22  GLUCOSE 89   < > 89 57* 94  BUN 112*   < > 38* 37* 35*  CREATININE 7.43*   < > 3.99* 3.48* 3.48*  CALCIUM 9.4   < > 9.5 7.8* 9.3  PROT 5.8*  --   --   --   --   ALBUMIN 1.3*   < > 1.2* 1.1* 1.2*  AST 53*  --   --   --   --   ALT 5  --   --   --   --   ALKPHOS 137*  --   --   --   --   BILITOT 1.9*  --   --   --   --   GFRNONAA 8*   < > 17* 20* 20*  ANIONGAP 17*   < > 11 12 13    < > = values in this interval not displayed.     Hematology Recent Labs  Lab 11/09/20 2140 11/10/20 0346 11/11/20 0303  WBC 13.5* 11.7* 12.3*  RBC 3.16* 3.39* 3.45*  HGB 9.4* 10.2* 10.2*  HCT 29.0* 30.9* 31.5*  MCV 91.8 91.2 91.3  MCH 29.7 30.1 29.6  MCHC 32.4 33.0 32.4  RDW 18.9* 19.0* 18.5*  PLT 172 175 217    BNPNo results for input(s): BNP, PROBNP in the last 168 hours.   DDimer No results for input(s): DDIMER in the last 168 hours.   Radiology    DG Abd 1 View  Result Date: 11/09/2020 CLINICAL DATA:  Ileus. EXAM: ABDOMEN - 1 VIEW COMPARISON:  November 08, 2020. FINDINGS: The bowel gas pattern is normal. Distal tip of nasogastric tube is seen in expected position of distal stomach. Feeding tube is seen looped within the stomach with distal tip in the proximal stomach. Gastric distention noted on prior exam has resolved. Residual contrast and stool is noted throughout the colon. No radio-opaque calculi or other significant radiographic abnormality are seen. IMPRESSION: No evidence of bowel obstruction or ileus. Feeding tube tip is seen in proximal stomach. Gastric distention noted on prior exam has resolved.  Electronically Signed   By: Marijo Conception M.D.   On: 11/09/2020 11:12    Cardiac Studies      Assessment &  Plan       Coronary artery disease presenting with an acute MI with LAD stenting and severe residual two-vessel disease  Acute systolic/diastolic heart failure  Atrial fibrillation-recurrent-persistent on IV amiodarone  MR moderate-severe eccentric  End-stage renal disease on dialysis  MSSA bacteremia, shock  Pneumonia-staph with cavitation   Intubated extubated 12/3  GI bleeding    Hemodynamics improved and holding sinus; continue IV amio Hemoglobin stable Denies chest pain but ...  Plan is for staged PCI  Continue IV cangrelor   If BP elevation persists could use topical nitrates in the setting of his known CAD      For questions or updates, please contact [facility] Please consult [email or web address] for contact info under        Signed, Virl Axe, MD  11/11/2020, 9:45 AM

## 2020-11-11 NOTE — Progress Notes (Signed)
Pt increasingly agitated with all nursing interventions and cares, especially turns. After turning pt wouldn't tolerate and deliberately turn supine on own. Pt educated on skin breakdown every 2 hours. Turns attempted documented.  In addition, pt verbally abusive to staff with harassing comments throughout shift.

## 2020-11-11 NOTE — Progress Notes (Signed)
Pharmacy Antibiotic Note  Travis [name] is a 59 y.o. male admitted on 10/28/2020 with MSSA bacteremia.  Pharmacy has been consulted for cefazolin dosing.  Planning prolonged (6 week) course, with end date of 12/19/2020.  Also with 1/2 blood cultures with MRSE - suspected to be contaminant.  Tm 99.2 today, WBC improved 40 > 12.3  Plan: Continue cefazolin 2g IV q 12 hrs - CRRT dosing. Will need to adjust dose if changes to intermittent HD on Monday.  Height: 5\' 11"  (180.3 cm) Weight: 78.8 kg (173 lb 11.6 oz) IBW/kg (Calculated) : 75.3  Temp (24hrs), Avg:98.9 F (37.2 C), Min:98.3 F (36.8 C), Max:99.2 F (37.3 C)  Recent Labs  Lab 11/08/20 0348 11/08/20 1706 11/08/20 1810 11/08/20 2342 11/09/20 0414 11/09/20 2140 11/09/20 2142 11/10/20 0346 11/10/20 1656 11/11/20 0303  WBC  --   --  24.0*  --  14.0* 13.5*  --  11.7*  --  12.3*  CREATININE  --    < >  --    < > 5.03*  --  4.37* 3.99* 3.48* 3.48*  LATICACIDVEN 0.5  --   --   --   --   --   --   --   --   --    < > = values in this interval not displayed.    Estimated Creatinine Clearance: 24.6 mL/min (A) (by C-G formula based on SCr of 3.48 mg/dL (H)).    Allergies  Allergen Reactions  . Ibuprofen Hives  . Lisinopril Swelling    PT states he is allergic to all prils; caused facial swelling  . Naproxen Hives and Other (See Comments)    Alleve causes patient to have hives    Zosyn 11/21>>11/21 Vanc 11/21>>11/21, 12/1 x1 Ancef 11/22>> (end 12/19/20)  11/21 Bcx: MSSA 11/22 Bcx: neg 11/23 Bcx: ngtd 11/23 TA: few GNR, rare GPC/GPR - MSSA 11/24 Bcx: ng 11/30 Bcx: 2/2 MRSE but drawn from same site 12/2 BCx: NGTD  Thank you for allowing pharmacy to be a part of this patient's care.  [place] Crane, Roylene Reason, BCCP Clinical Pharmacist  11/11/2020 11:00 AM   [facility] pharmacy phone numbers are listed on [email or web address]

## 2020-11-11 NOTE — Progress Notes (Signed)
Pt demanded to be discharged and started intentionally spitting large mucous plugs at bedside nurse and wife. Noncompliant to care, biting suction tubing during oral care, needs constant reinforcement and deescalating. PRN ativan given, pt's inappropriate behavior not improved,then PRN haldol given.

## 2020-11-12 ENCOUNTER — Encounter (HOSPITAL_COMMUNITY): Payer: Self-pay | Admitting: Gastroenterology

## 2020-11-12 DIAGNOSIS — I2109 ST elevation (STEMI) myocardial infarction involving other coronary artery of anterior wall: Secondary | ICD-10-CM | POA: Diagnosis not present

## 2020-11-12 LAB — RENAL FUNCTION PANEL
Albumin: 1.2 g/dL — ABNORMAL LOW (ref 3.5–5.0)
Albumin: 1.3 g/dL — ABNORMAL LOW (ref 3.5–5.0)
Anion gap: 13 (ref 5–15)
Anion gap: 14 (ref 5–15)
BUN: 43 mg/dL — ABNORMAL HIGH (ref 6–20)
BUN: 55 mg/dL — ABNORMAL HIGH (ref 6–20)
CO2: 21 mmol/L — ABNORMAL LOW (ref 22–32)
CO2: 20 mmol/L — ABNORMAL LOW (ref 22–32)
Calcium: 9 mg/dL (ref 8.9–10.3)
Calcium: 9.1 mg/dL (ref 8.9–10.3)
Chloride: 100 mmol/L (ref 98–111)
Chloride: 97 mmol/L — ABNORMAL LOW (ref 98–111)
Creatinine, Ser: 4.93 mg/dL — ABNORMAL HIGH (ref 0.61–1.24)
Creatinine, Ser: 5.85 mg/dL — ABNORMAL HIGH (ref 0.61–1.24)
GFR, Estimated: 13 mL/min — ABNORMAL LOW (ref >60)
GFR, Estimated: 10 mL/min — ABNORMAL LOW (ref >60)
Glucose, Bld: 102 mg/dL — ABNORMAL HIGH (ref 70–99)
Glucose, Bld: 95 mg/dL (ref 70–99)
Phosphorus: 3.5 mg/dL (ref 2.5–4.6)
Phosphorus: 4.2 mg/dL (ref 2.5–4.6)
Potassium: 4 mmol/L (ref 3.5–5.1)
Potassium: 4.2 mmol/L (ref 3.5–5.1)
Sodium: 134 mmol/L — ABNORMAL LOW (ref 135–145)
Sodium: 131 mmol/L — ABNORMAL LOW (ref 135–145)

## 2020-11-12 LAB — GLUCOSE, CAPILLARY
Glucose-Capillary: 72 mg/dL (ref 70–99)
Glucose-Capillary: 74 mg/dL (ref 70–99)
Glucose-Capillary: 82 mg/dL (ref 70–99)
Glucose-Capillary: 82 mg/dL (ref 70–99)
Glucose-Capillary: 97 mg/dL (ref 70–99)
Glucose-Capillary: 97 mg/dL (ref 70–99)

## 2020-11-12 LAB — HEPARIN LEVEL (UNFRACTIONATED)
Heparin Unfractionated: 0.16 IU/mL — ABNORMAL LOW (ref 0.30–0.70)
Heparin Unfractionated: 0.23 IU/mL — ABNORMAL LOW (ref 0.30–0.70)

## 2020-11-12 LAB — CBC
HCT: 29 % — ABNORMAL LOW (ref 39.0–52.0)
Hemoglobin: 9.4 g/dL — ABNORMAL LOW (ref 13.0–17.0)
MCH: 29.9 pg (ref 26.0–34.0)
MCHC: 32.4 g/dL (ref 30.0–36.0)
MCV: 92.4 fL (ref 80.0–100.0)
Platelets: 234 10*3/uL (ref 150–400)
RBC: 3.14 MIL/uL — ABNORMAL LOW (ref 4.22–5.81)
RDW: 18.3 % — ABNORMAL HIGH (ref 11.5–15.5)
WBC: 12.7 10*3/uL — ABNORMAL HIGH (ref 4.0–10.5)
nRBC: 0 % (ref 0.0–0.2)

## 2020-11-12 LAB — MAGNESIUM: Magnesium: 2.4 mg/dL (ref 1.7–2.4)

## 2020-11-12 MED ORDER — CEFAZOLIN SODIUM-DEXTROSE 2-4 GM/100ML-% IV SOLN
2.0000 g | INTRAVENOUS | Status: DC
  Administered 2020-11-13 – 2020-11-24 (×4): 2 g via INTRAVENOUS
  Filled 2020-11-12 (×6): qty 100

## 2020-11-12 NOTE — Progress Notes (Signed)
Pt BP is slowly decreasing. MAPs are falling blow goal >65. RN starting Norepi, per ordered, at this time and will titrate accordingly.

## 2020-11-12 NOTE — Progress Notes (Signed)
ANTICOAGULATION CONSULT NOTE  Pharmacy Consult for:  Heparin Indication:  atrial fibrillation   Patient Measurements: Height: 5\' 11"  (180.3 cm) Weight: 78.8 kg (173 lb 11.6 oz) IBW/kg (Calculated) : 75.3 Heparin Dosing Weight: 86.4 kg   Vital Signs: Temp: 99.5 F (37.5 C) (12/04 2008) Temp Source: Oral (12/04 2008) BP: 114/80 (12/05 0400) Pulse Rate: 98 (12/05 0400)  Labs: Recent Labs    11/10/20 0346 11/10/20 1656 11/11/20 0303 11/11/20 1728 11/12/20 0314  HGB 10.2*  --  10.2*  --  9.4*  HCT 30.9*  --  31.5*  --  29.0*  PLT 175  --  217  --  234  HEPARINUNFRC  --   --   --  <0.10* 0.16*  CREATININE 3.99*   < > 3.48* 3.95* 4.93*   < > = values in this interval not displayed.    Estimated Creatinine Clearance: 17.4 mL/min (A) (by C-G formula based on SCr of 4.93 mg/dL (H)).  Assessment: 59 y.o. male with Afib and CAD awaiting PCI, for heparin  Goal of Therapy:  Heparin level 0.3-0.5 units/mL while on Cangrelor  Monitor platelets by anticoagulation protocol: Yes   Plan:  Increase Heparin 1950 units/hr Check heparin level in 8 hours.  Phillis Knack, PharmD, BCPS

## 2020-11-12 NOTE — Progress Notes (Signed)
ANTICOAGULATION CONSULT NOTE  Pharmacy Consult for: Restart Heparin Indication: chest pain/ACS with no intervention and Afib   Patient Measurements: Height: 5\' 11"  (180.3 cm) Weight: 80.9 kg (178 lb 5.6 oz) IBW/kg (Calculated) : 75.3 Heparin Dosing Weight: 80 kg   Vital Signs: Temp: 98.9 F (37.2 C) (12/05 1234) Temp Source: Oral (12/05 0741) BP: 134/86 (12/05 1500) Pulse Rate: 99 (12/05 1500)  Labs: Recent Labs    11/10/20 0346 11/10/20 1656 11/11/20 0303 11/11/20 0303 11/11/20 1728 11/12/20 0314 11/12/20 1516  HGB 10.2*  --  10.2*  --   --  9.4*  --   HCT 30.9*  --  31.5*  --   --  29.0*  --   PLT 175  --  217  --   --  234  --   HEPARINUNFRC  --   --   --   --  <0.10* 0.16* 0.23*  CREATININE 3.99*   < > 3.48*   < > 3.95* 4.93* 5.85*   < > = values in this interval not displayed.    Estimated Creatinine Clearance: 14.7 mL/min (A) (by C-G formula based on SCr of 5.85 mg/dL (H)).  Assessment: 69 yom presenting with STEMI. Known to have dilated ascending aorta, ESRD. Found to have 99% mid LAD with multivessel in LCx and RCA, now s/p DES to LAD. Heparin was started post sheath removal. Patient was found to have bacteremia and will not proceed with cath intervention at this time. Patient developed Afib during admission treated with amiodarone and successful cardioversion, but has had episodes of Afib post cardioversion. Currently patient is in NSR. Patient has had unstable Hgb through admission requiring transfusion.   Heparin was held 12/1 for GI workup/bleeding.  Now stabilized, Hgb stable at 10.2.  Discussed with Drs. Tamala Julian and Caryl Comes - okay to restart heparin today with lower heparin level goal.  Heparin level this afternoon remains SUBtherapeutic though trending up (HL 0.23 << 0.16, goal of 0.3-0.5). Per discussion with RN - infusing appropriately. RN noted two dark stools today - will monitor along with Hgb trends given recent concern for GIB.   Goal of Therapy:   Heparin level 0.3-0.5 units/mL while on Cangrelor  Monitor platelets by anticoagulation protocol: Yes   Plan:  - Increase Heparin to 2100 units/hr (21 ml/hr) - Will continue to monitor for any signs/symptoms of bleeding and will follow up with heparin level in 8 hours   Thank you for allowing pharmacy to be a part of this patient's care.  Alycia Rossetti, PharmD, BCPS Clinical Pharmacist Clinical phone for 11/12/2020: [phone] 11/12/2020 3:50 PM   **Pharmacist phone directory can now be found on [email or web address] ([email or web address]).  Listed under [place].

## 2020-11-12 NOTE — Progress Notes (Signed)
Inpatient Rehab Admissions Coordinator Note:   Per PT recommendation, pt was screened for CIR candidacy by Gayland Curry, MS, CCC-SLP.  At this time we are not recommending an inpatient rehab consult..  Will continue to monitor pt's progress with therapies and medical workup from a distance. If pt becomes medically appropriate for CIR candidacy, will place consult order.  Please contact me with questions.    Gayland Curry, [place], [place] Admissions Coordinator [phone] 11/12/20 5:08 PM

## 2020-11-12 NOTE — Progress Notes (Signed)
[facility] Progress Note   Subjective: Seen in room. Extubated, tired, not able to respond verbally much as this time   Objective Vitals:   11/12/20 1100 11/12/20 1200 11/12/20 1234 11/12/20 1300  BP: (!) 120/95 121/86  136/87  Pulse: 100 99  100  Resp: (!) 34 (!) 31  (!) 32  Temp:   98.9 F (37.2 C)   TempSrc:      SpO2: 94% 95%  96%  Weight:      Height:       Physical Exam Gen: is extubated now, alert, very tired Heart:S1,S2 No M/R/G Lungs: bilat rhonchi and scattered wheezes, no rales Abd: soft ntnd no mass or ascites Extremities: No LE edema.BLE heel protectors.LUE 1-2+ edema Dialysis Access:L AVF + T/B Temp cath in place.Triple lumen CVL.   Home meds:  -  Labetalol 300 bid  - sensipar 120/ auryxia 3 ac  - prn's/ vitamins/ supplements   OP HD:AF MWF 4h 450/800 81.5kg 2/2.25 bath RUE AVF Hep 5000+ 1000 mid-run - hect 5 ug tiw  - mircera 50 ugIVq 4 weeks, last 11/15 (due 11/29)   Assessment/ Plan: 1. ESRD - usual HD MWF. ^^LVEDP per initial LHC.Started CRRT 11/24. Stopped 11/29 resumed 12/01. Stopped 12/4. Plan is for iHD in ICU tomorrow on schedule.  2. Acute STEMI- SP LAD PCI11/20 am, has otherdisease inLCx/ RCA.Transitioned to brilinta from cangrelor. Per cardiology. 3. MSSA cavitary PNA / bacteremia-intubated 11/23. Per PCCM. Respiratory culture 11/23 with MSSA. Blood cx +11/21.CT abd/ pelvis negative 11/23.F/U bcx's negative.Dialysis duplex 11/22 without abscessandUS of AVF wasnegative 11/27. Plan for 6 weeks of IV cefazolin per pharmacy ending 12/31/2019 . ID signed off. 4. PEA arrest: occurred in setting of agitation/ sedation.Brief CPR. 5. Vomiting-  On 11/30. CT abd was negative. SP EGD w/ severe esophagitis and duodenal ulcers probably from ischemia. Per primary, GI.  6. Volume-appears euvolemic, local LUE edema. 1kg under, wt's up a bit, UF 2- 2.5 L w/ HD tomorrow as tol.  7. Shock -  resolved 8. Anemia ckd -  S/P PRBCs. HGB 8s 9. MBD ckd -Orally intubated, binders on hold. 10. Afib:on amio and hep gtt, s/p DCCV.  10.Severe MR:per cardiology.  Kelly Splinter, MD 11/12/2020, 2:02 PM    Additional Objective Labs: Basic Metabolic Panel: Recent Labs  Lab 11/11/20 0303 11/11/20 1728 11/12/20 0314  NA 137 135 134*  K 4.0 4.2 4.0  CL 102 102 100  CO2 22 22 21*  GLUCOSE 94 90 102*  BUN 35* 35* 43*  CREATININE 3.48* 3.95* 4.93*  CALCIUM 9.3 9.1 9.0  PHOS 2.6 2.9 3.5   Liver Function Tests: Recent Labs  Lab 11/08/20 0347 11/08/20 1706 11/11/20 0303 11/11/20 1728 11/12/20 0314  AST 53*  --   --   --   --   ALT 5  --   --   --   --   ALKPHOS 137*  --   --   --   --   BILITOT 1.9*  --   --   --   --   PROT 5.8*  --   --   --   --   ALBUMIN 1.3*   < > 1.2* 1.3* 1.2*   < > = values in this interval not displayed.   No results for input(s): LIPASE, AMYLASE in the last 168 hours. CBC: Recent Labs  Lab 11/09/20 0414 11/09/20 0414 11/09/20 2140 11/09/20 2140 11/10/20 0346 11/11/20 0303 11/12/20 0314  WBC 14.0*   < >  13.5*   < > 11.7* 12.3* 12.7*  HGB 8.9*   < > 9.4*   < > 10.2* 10.2* 9.4*  HCT 26.2*   < > 29.0*   < > 30.9* 31.5* 29.0*  MCV 92.3  --  91.8  --  91.2 91.3 92.4  PLT 186   < > 172   < > 175 217 234   < > = values in this interval not displayed.   Blood Culture    Component Value Date/Time   SDES BLOOD BLOOD RIGHT HAND 11/09/2020 0554   SPECREQUEST  11/09/2020 0554    BOTTLES DRAWN AEROBIC AND ANAEROBIC Blood Culture adequate volume   CULT  11/09/2020 0554    NO GROWTH 3 DAYS Performed at [facility], [place] [address] [place], [place] [address]    REPTSTATUS PENDING 11/09/2020 5284    Cardiac Enzymes: No results for input(s): CKTOTAL, CKMB, CKMBINDEX, TROPONINI in the last 168 hours. CBG: Recent Labs  Lab 11/11/20 2003 11/11/20 2355 11/12/20 0406 11/12/20 0737 11/12/20 1204  GLUCAP 85 90 97 97 82   Iron  Studies: No results for input(s): IRON, TIBC, TRANSFERRIN, FERRITIN in the last 72 hours. @lablastinr3 @ Studies/Results: No results found. Medications: . sodium chloride Stopped (11/10/20 1944)  . sodium chloride Stopped (10/28/20 0954)  . sodium chloride Stopped (11/04/20 0016)  . sodium chloride    . sodium chloride    . amiodarone 30 mg/hr (11/12/20 1000)  . cangrelor 50 mg in NS 250 mL 0.75 mcg/kg/min (11/12/20 1339)  .  ceFAZolin (ANCEF) IV 2 g (11/12/20 1338)  . dexmedetomidine (PRECEDEX) IV infusion 0.7 mcg/kg/hr (11/12/20 1339)  . dextrose 50 mL/hr at 11/12/20 0600  . epinephrine Stopped (11/09/20 0304)  . heparin 1,950 Units/hr (11/12/20 1000)  . norepinephrine (LEVOPHED) Adult infusion Stopped (11/10/20 0555)  . propofol (DIPRIVAN) infusion Stopped (11/09/20 1520)  . vasopressin Stopped (11/09/20 1708)   . sodium chloride   Intravenous Once  . atorvastatin  80 mg Per Tube q1800  . B-complex with vitamin C  1 tablet Per Tube Daily  . chlorhexidine gluconate (MEDLINE KIT)  15 mL Mouth Rinse BID  . Chlorhexidine Gluconate Cloth  6 each Topical Q0600  . clonazepam  1 mg Oral BID  . cloNIDine  0.2 mg Transdermal Weekly  . docusate  100 mg Per Tube BID  . doxercalciferol  5 mcg Intravenous Q M,W,F  . insulin aspart  0-6 Units Subcutaneous Q4H  . lidocaine  1 patch Transdermal Q24H  . mouth rinse  15 mL Mouth Rinse 10 times per day  . metoCLOPramide (REGLAN) injection  10 mg Intravenous Q12H  . pantoprazole (PROTONIX) IV  40 mg Intravenous Q12H  . polyethylene glycol  17 g Per Tube Daily  . QUEtiapine  100 mg Per Tube BID  . senna  1 tablet Per Tube QHS  . sevelamer carbonate  0.8 g Per Tube TID  . sodium chloride flush  10-40 mL Intracatheter Q12H  . sodium chloride flush  3 mL Intravenous Q12H

## 2020-11-12 NOTE — Progress Notes (Signed)
Progress Note  Patient Name: Travis [name] Date of Encounter: 11/12/2020  [facility] Cardiologist: Glenetta Hew, MD  Patient Profile     59 y.o. male with ESRD on HD ( being considered for renal transplant at [facility]) , dilated aorta recent anterior STEMI, CAD.  Cath >> LAD99>>0; CX-99; RCA 80; EF 40-45% Echo EF 35-40%/ MR mod-sev  Course complicated by MSSA bacteremia shock and Staphylococcus pneumonia with cavitation requiring extubated 12/3  ; PEA arrest and recurrent afib requiring DCCV and AMIO, itself complicated by pressor dependent hypotension. Upper GI bleed prolapse change of Brilinta--IV Cangrelor to assure absorption Interval Hypertension   Subjective    More alert and no complaints of chest pain   mild sob  speech difficult Wife at bedside   Inpatient Medications    Scheduled Meds:  sodium chloride   Intravenous Once   atorvastatin  80 mg Per Tube q1800   B-complex with vitamin C  1 tablet Per Tube Daily   chlorhexidine gluconate (MEDLINE KIT)  15 mL Mouth Rinse BID   Chlorhexidine Gluconate Cloth  6 each Topical Q0600   clonazepam  1 mg Oral BID   cloNIDine  0.2 mg Transdermal Weekly   docusate  100 mg Per Tube BID   doxercalciferol  5 mcg Intravenous Q M,W,F   insulin aspart  0-6 Units Subcutaneous Q4H   lidocaine  1 patch Transdermal Q24H   mouth rinse  15 mL Mouth Rinse 10 times per day   metoCLOPramide (REGLAN) injection  10 mg Intravenous Q12H   pantoprazole (PROTONIX) IV  40 mg Intravenous Q12H   polyethylene glycol  17 g Per Tube Daily   QUEtiapine  100 mg Per Tube BID   senna  1 tablet Per Tube QHS   sevelamer carbonate  0.8 g Per Tube TID   sodium chloride flush  10-40 mL Intracatheter Q12H   sodium chloride flush  3 mL Intravenous Q12H   Continuous Infusions:  sodium chloride Stopped (11/10/20 1944)   sodium chloride Stopped (10/28/20 0954)   sodium chloride Stopped (11/04/20 0016)   sodium chloride     sodium chloride     amiodarone  30 mg/hr (11/12/20 0600)   cangrelor 50 mg in NS 250 mL 0.75 mcg/kg/min (11/12/20 0600)    ceFAZolin (ANCEF) IV Stopped (11/11/20 2358)   dexmedetomidine (PRECEDEX) IV infusion 0.5 mcg/kg/hr (11/12/20 0600)   dextrose 50 mL/hr at 11/12/20 0600   epinephrine Stopped (11/09/20 0304)   heparin 1,950 Units/hr (11/12/20 0600)   norepinephrine (LEVOPHED) Adult infusion Stopped (11/10/20 0555)   propofol (DIPRIVAN) infusion Stopped (11/09/20 1520)   vasopressin Stopped (11/09/20 1708)   PRN Meds: sodium chloride, sodium chloride, sodium chloride, acetaminophen, bisacodyl, fentaNYL (SUBLIMAZE) injection, haloperidol lactate, lidocaine (PF), lidocaine-prilocaine, LORazepam, ondansetron (ZOFRAN) IV, pentafluoroprop-tetrafluoroeth, sodium chloride flush, sodium chloride flush   Vital Signs    Vitals:   11/12/20 0400 11/12/20 0500 11/12/20 0600 11/12/20 0741  BP: 114/80 119/63 120/71   Pulse: 98 93 98   Resp: (!) 38 (!) 36 (!) 25   Temp:    99.9 F (37.7 C)  TempSrc:    Oral  SpO2: 97% 97% 95%   Weight:  80.9 kg    Height:        Intake/Output Summary (Last 24 hours) at 11/12/2020 0757 Last data filed at 11/12/2020 0600 Gross per 24 hour  Intake 2408.44 ml  Output 510 ml  Net 1898.44 ml   Last 3 Weights 11/12/2020 11/11/2020 11/10/2020  Weight (  lbs) 178 lb 5.6 oz 173 lb 11.6 oz 174 lb 6.1 oz  Weight (kg) 80.9 kg 78.8 kg 79.1 kg      Telemetry    Pause assoc with PP prolongation and AV block>> hypervagatonia   ECG      Physical Exam  Well developed and nourished in no acute distress HENT normal Neck supple   coarse Regular rate and rhythm, no murmurs or gallops Abd-soft with active BS No Clubbing cyanosis edema Skin-warm and dry A & Oriented  Grossly normal sensory and motor function       Labs    High Sensitivity Troponin:   Recent Labs  Lab 10/28/20 0629 10/28/20 1046 10/28/20 2040 10/29/20 2121 10/30/20 0548  TROPONINIHS 3,155* >27,000* >27,000* >27,000*  >27,000*      Chemistry Recent Labs  Lab 11/08/20 0347 11/08/20 1706 11/11/20 0303 11/11/20 1728 11/12/20 0314  NA 134*   < > 137 135 134*  K 5.5*   < > 4.0 4.2 4.0  CL 101   < > 102 102 100  CO2 16*   < > 22 22 21*  GLUCOSE 89   < > 94 90 102*  BUN 112*   < > 35* 35* 43*  CREATININE 7.43*   < > 3.48* 3.95* 4.93*  CALCIUM 9.4   < > 9.3 9.1 9.0  PROT 5.8*  --   --   --   --   ALBUMIN 1.3*   < > 1.2* 1.3* 1.2*  AST 53*  --   --   --   --   ALT 5  --   --   --   --   ALKPHOS 137*  --   --   --   --   BILITOT 1.9*  --   --   --   --   GFRNONAA 8*   < > 20* 17* 13*  ANIONGAP 17*   < > 13 11 13    < > = values in this interval not displayed.     Hematology Recent Labs  Lab 11/10/20 0346 11/11/20 0303 11/12/20 0314  WBC 11.7* 12.3* 12.7*  RBC 3.39* 3.45* 3.14*  HGB 10.2* 10.2* 9.4*  HCT 30.9* 31.5* 29.0*  MCV 91.2 91.3 92.4  MCH 30.1 29.6 29.9  MCHC 33.0 32.4 32.4  RDW 19.0* 18.5* 18.3*  PLT 175 217 234    BNPNo results for input(s): BNP, PROBNP in the last 168 hours.   DDimer No results for input(s): DDIMER in the last 168 hours.   Radiology    No results found.  Cardiac Studies      Assessment & Plan       Coronary artery disease presenting with an acute MI with LAD stenting and severe residual two-vessel disease  Acute systolic/diastolic heart failure  Atrial fibrillation-recurrent-persistent on IV amiodarone  MR moderate-severe eccentric  End-stage renal disease on dialysis  MSSA bacteremia, shock  Abx x 6 weeks   Pneumonia-staph with cavitation   Intubated>> extubated 12/3  GI bleeding  HGB Stable     If BP elevation persists could use topical nitrates in the setting of his known CAD   Hemodynamics improved and holding sinus; continue IV amio Hemoglobin stable Denies chest pain but ...   IV cangrelor until able to take PO  Plan is for staged PCI   Worth repeating echo to reassess LVEF and MR --ordered    For questions or  updates, please contact [facility] Please consult [email or web address]  for contact info under        Signed, Virl Axe, MD  11/12/2020, 7:57 AM

## 2020-11-12 NOTE — Progress Notes (Signed)
Pharmacy Antibiotic Note  Travis [name] is a 59 y.o. male admitted on 10/28/2020 with MSSA bacteremia.  Pharmacy has been consulted for cefazolin dosing.  Planning prolonged (6 week) course, with end date of 12/19/2020.  Also with 1/2 blood cultures with MRSE - suspected to be contaminant.  Tm 99.2 today, WBC improved 40 > 12.3  CRRT stopped 12/4 - planning iHD tomorrow.  Plan: Change cefazolin to 2g IV q HD session.  Height: 5\' 11"  (180.3 cm) Weight: 80.9 kg (178 lb 5.6 oz) IBW/kg (Calculated) : 75.3  Temp (24hrs), Avg:99.4 F (37.4 C), Min:98.9 F (37.2 C), Max:99.9 F (37.7 C)  Recent Labs  Lab 11/08/20 0348 11/08/20 1706 11/09/20 0414 11/09/20 2140 11/09/20 2142 11/10/20 0346 11/10/20 1656 11/11/20 0303 11/11/20 1728 11/12/20 0314  WBC  --    < > 14.0* 13.5*  --  11.7*  --  12.3*  --  12.7*  CREATININE  --    < > 5.03*  --    < > 3.99* 3.48* 3.48* 3.95* 4.93*  LATICACIDVEN 0.5  --   --   --   --   --   --   --   --   --    < > = values in this interval not displayed.    Estimated Creatinine Clearance: 17.4 mL/min (A) (by C-G formula based on SCr of 4.93 mg/dL (H)).    Allergies  Allergen Reactions  . Ibuprofen Hives  . Lisinopril Swelling    PT states he is allergic to all prils; caused facial swelling  . Naproxen Hives and Other (See Comments)    Alleve causes patient to have hives    Zosyn 11/21>>11/21 Vanc 11/21>>11/21, 12/1 x1 Ancef 11/22>> (end 12/19/20)  11/21 Bcx: MSSA 11/22 Bcx: neg 11/23 Bcx: ngtd 11/23 TA: few GNR, rare GPC/GPR - MSSA 11/24 Bcx: ng 11/30 Bcx: 2/2 MRSE but drawn from same site 12/2 BCx: NGTD  Thank you for allowing pharmacy to be a part of this patient's care.  [place] Crane, Roylene Reason, BCCP Clinical Pharmacist  11/12/2020 3:02 PM   [facility] pharmacy phone numbers are listed on [email or web address]

## 2020-11-12 NOTE — Progress Notes (Addendum)
NAME:  Travis [name], MRN:  [id number], DOB:  06-29-1961, LOS: 28 ADMISSION DATE:  10/28/2020, CONSULTATION DATE:  10/29/20 REFERRING MD:  Cardiology - [name], CHIEF COMPLAINT:  Hypotension, gram positive cocci on culture, AMS  Brief History   [name] is a 59 year old man with a history of HTN, ESRD on HD MWF, RUE AV fistula, hx of smoking, admitted 10/28/20 with fevers, myalgias for several days, acute chest pain.    History of present illness   On Admission, found to have STEMI, multivessel disease. Pulm edema with mild hypoxemia.  S/Post PCI to the LAD on 10/29/19. Planning for staged intervention to the left circumflex and possibly right coronary artery.  EF 21-22%, diastolic dysfunction, LVEDP 35. Underwent HD on 11/20 with 4 L volume removed.  Saturation improved after dialysis (100% on RA) Fevers noted 11/20. Blood cultures grew MSSA, noted this evening. .    This evening developed Afib with RVR 180s. Adenosine given, without improvement  Amiodarone infusion started per cardiology, 150mg  bolus, then drip.  Lopressor 5mg IV and 500ccNS given. Started on Phenylephrine, was on 222mcg on my arrival.   Cardioverted emergently at 120J for ongoing afib with hypotension (60/40) and AMS.  Converted to sinus.  Remained moderately hypotensive (MAP 60) on phenylephrine.   Patient was given one dose zosyn, switched to ancef once cultures grew back MSSA.   Cardiac stress tests annually, last done 11/16/19.    Past Medical History  HTN ESRD, HD MWF  Significant Hospital Events   Cardioversion 11/21 Cardiac cath stent to LAD 11/20  Consults:  PCCM  Nephrology ID  Procedures:  Central line 11/21 >> 11/29 Arterial line 11/21 Repeat DCCV 11/23 11/23 ETT HD line 11/24 Arterial line 11/24 L [place] CVC 11/30>>   Significant Diagnostic Tests:  11/24 TEE>>Left ventricular ejection fraction, by estimation, is 35 to 40%. The  left ventricle has moderately decreased function.Moderate to  severe mitral regurgitation. 11/23 CT Chest>>Extensive multifocal nodular and patchy airspace disease in both lungs with a slight peripheral predominance in the upper lungs. 3.1 cm nodular consolidative opacity in the right upper lobe is cavitated. Imaging features likely related to multifocal pneumonia. Septic emboli and metastatic disease considered less likely but not excluded. 11/22 LUE Vas Upper Extremity Doppler>> Arteriovenous fistula-Aneurysmal dilatation noted. 11/24 CT abdomen - no retroperitoneal hematoma 11/29 MR Brain>> No evidence acute intracranial abnormality, sinisitis, mastoid effusions 11/29>> MR Cervical Spine>> Given the provided history of bacteremia, facet joint septic arthritis is difficult to definitively exclude, but the lack of any surrounding marrow edema argues against this Cervical spondylosis  11/29 MR Thoracic Spine No significant marrow edema in thoracic spine , Mild thoracic spondylosis hypointense marrow signal throughout the thoracic spine, likely related to the patient's end-stage renal disease. multifocal airspace disease and cavitary pulmonary lesions. Small right pleural effusion. 11/29 MR Lumbar Spine:As noted above and Suspected cholecystolithiasis  Micro Data:  11/21 BCx2>>Staph aureus > MSSA by BCID 11/24 BCx2>> 11/30 BC x 2>> staph epi>>  Antimicrobials:  Zosyn 11/21 x 1 Ancef 11/21 ->  Interim history/subjective:  Has a stage 2 pressure ulcer that family is upset about.  Patient is hungry and complaining of pain at this pressure ulcer site.  Objective   Blood pressure 120/71, pulse 98, temperature 99.9 F (37.7 C), temperature source Oral, resp. rate (!) 25, height 5\' 11"  (1.803 m), weight 80.9 kg, SpO2 95 %. CVP:  [5 mmHg] 5 mmHg      Intake/Output Summary (Last 24 hours)  at 11/12/2020 1052 Last data filed at 11/12/2020 1000 Gross per 24 hour  Intake 2393.35 ml  Output 86 ml  Net 2307.35 ml   Filed Weights   11/10/20 0500 11/11/20  0500 11/12/20 0500  Weight: 79.1 kg 78.8 kg 80.9 kg   Constitutional: no acute distress  Eyes: eomi, tracking Ears, nose, mouth, and throat: MM dry, trachea midline Cardiovascular: RRR, ext warm Respiratory: more clear today, no accesory muscle use Gastrointestinal: Soft, hypoactive BS Skin: No rashes, normal turgor Neurologic: moving all 4 ext to command Psychiatric: thoughts more clear today  Sugars low Chemistries okay CBC looks good Net +1.8L  Resolved Hospital Problem list     Assessment & Plan:   Anterior STEMI, CAD, acute CHF exacerbation- will need definitive PCI once recovers Afib s/p amio now sinus rhythm MSSA bacteremia, source thought HD site on cefazolin ESRD on HD UGIB- secondary to severe esophagitis and duodenal ulcer question ischemic s/p EGD on 11/09/20 Constipation- resolved Acute toxic metabolic encephalopathy, ICU delirium- still an issue Acute hypoxemic respiratory failure- off vent, still having issues with toileting Pressure ulcer- not POA  - Encourage IS, flutter - Turning as able - Appreciate PT/OT input - Haldol PRN, wean precedex - Swallow screen, if cannot take adequate PO we need to consider IR guided NGT vs. TPN tomorrow - Continue prolonged course of abx - Continue PPI, watch H/H - Continue cangrelor until can take PO consistently - WOC consult for local care of stage 2 sacral ulcer  Best practice:  Diet: pending swallow screen Pain/Anxiety/Delirium protocol (if indicated): haldol PRN, re-orientation VAP protocol (if indicated): off DVT prophylaxis: on heparin gtt GI prophylaxis: protonix Glucose control: supplemental IV glucose being given until can take PO consistently Mobility: Bedrest Code Status: Full  Family Communication: Spoke with wife at bedside 12/4 Disposition: ICU pending precedex liberation   Patient critically ill due to acute metabolic encephalopathy Interventions to address this today weaning precedex Risk of  deterioration without these interventions is high  I personally spent 38 minutes providing critical care not including any separately billable procedures  Erskine Emery MD [facility] 11/12/2020 4:03 PM Personal pager: [phone] If unanswered, please page CCM On-call: [phone]

## 2020-11-12 NOTE — Progress Notes (Signed)
RN called Travis [name] d/t pt being NPO and medications ordered PER-TUBE with no tube to administer meds. RN holding all those meds for that reason, as well as increased risk for aspiration with swallow evaluation schedule for tomorrow.. IV Meds will be administered.

## 2020-11-12 NOTE — Evaluation (Signed)
Physical Therapy Evaluation Patient Details Name: Travis [name] MRN: [id number] DOB: 30-Mar-1961 Today's Date: 11/12/2020   History of Present Illness  59 y.o. male with past medical history of end-stage renal disease on hemodialysis, essential hypertension, dilated ascending aorta and hyperlipidemia who presented on November 20 with anterior ST elevation myocardial infarction.  VDRF 11/23-12/3.  Stent placed 11/20.  Afib with RVR with cardioversion 11/21. Suspected cholecystolithiasis 11/29.    Clinical Impression  Pt admitted with above diagnosis. Pt was able to sit EOB x 10 minutes with total to max assist with pt unable to maintain balance all directions.  Worked on incr balance and performing some UE and LE movement.  Pt inactive most of the time except pt did grab PT's pocket and pull and had trouble loosening pts grip.  Pt with poor mobility overall and will need extensive Rehab to recover.  Wife states she can provide 24 hour care.  Will follow acutely and progress pt as able. Wife mentions pt with buttock wound and at end of session, assisted nurse with changing pts dressing.  Nurse to contact MD regarding possible wound care consult for pt.  MD: Would pt benefit from air mattress overlay?   Pt currently with functional limitations due to the deficits listed below (see PT Problem List). Pt will benefit from skilled PT to increase their independence and safety with mobility to allow discharge to the venue listed below.      Follow Up Recommendations CIR;Supervision/Assistance - 24 hour    Equipment Recommendations  Wheelchair (measurements PT);Wheelchair cushion (measurements PT)    Recommendations for Other Services       Precautions / Restrictions Precautions Precautions: Fall Restrictions Weight Bearing Restrictions: No      Mobility  Bed Mobility Overal bed mobility: Needs Assistance Bed Mobility: Supine to Sit     Supine to sit: Total assist;+2 for physical  assistance     General bed mobility comments: Pt required total assist of 2 to come to EOB needing assist with LEs and elevation of trunk.     Transfers                 General transfer comment: unable  Ambulation/Gait             General Gait Details: unable  Stairs            Wheelchair Mobility    Modified Rankin (Stroke Patients Only)       Balance Overall balance assessment: Needs assistance Sitting-balance support: No upper extremity supported;Feet supported;Bilateral upper extremity supported Sitting balance-Leahy Scale: Zero Sitting balance - Comments: Pt needing total to max assist for sitting EOB for up to 10 minutes.  Pt with flexed neck and trunk with pt having difficulty even extending neck with PT lifiting neck for pt to look up and immediately flexes back down to full flexion as he cannot hold the neck up. Pt also does not use the UEs to hold himself up as he does not have postural stability in trunk.  Attempts to kick LEs were not successful as he has trace movement against gravity.  Postural control: Posterior lean;Right lateral lean;Left lateral lean (Anterior lean)                                   Pertinent Vitals/Pain Pain Assessment: No/denies pain    Home Living Family/patient expects to be discharged to:: Private residence Living Arrangements:  Spouse/significant other Available Help at Discharge: Family;Available 24 hours/day Type of Home: House Home Access: Stairs to enter Entrance Stairs-Rails: None Entrance Stairs-Number of Steps: 1 Home Layout: One level Home Equipment: None      Prior Function Level of Independence: Needs assistance   Gait / Transfers Assistance Needed: Per wife, pt walked PTA without device  ADL's / Homemaking Assistance Needed: B/D independent per wife        Hand Dominance        Extremity/Trunk Assessment   Upper Extremity Assessment Upper Extremity Assessment: RUE  deficits/detail;LUE deficits/detail RUE Deficits / Details: grossly 2-5 LUE Deficits / Details: grossly 2-/5    Lower Extremity Assessment Lower Extremity Assessment: RLE deficits/detail;LLE deficits/detail RLE Deficits / Details: grossly 2-/5 LLE Deficits / Details: grossly 2-/5    Cervical / Trunk Assessment Cervical / Trunk Assessment: Kyphotic;Other exceptions (neck flexed and trunk flexed with little use of UEs)  Communication   Communication: No difficulties  Cognition Arousal/Alertness: Lethargic Behavior During Therapy: Flat affect Overall Cognitive Status: Impaired/Different from baseline Area of Impairment: Orientation;Memory;Following commands;Safety/judgement;Awareness;Problem solving                 Orientation Level: Disoriented to;Time;Situation;Place   Memory: Decreased short-term memory Following Commands: Follows one step commands inconsistently;Follows one step commands with increased time Safety/Judgement: Decreased awareness of safety;Decreased awareness of deficits   Problem Solving: Difficulty sequencing;Requires verbal cues;Requires tactile cues;Decreased initiation;Slow processing General Comments: Pt responds to commands inconsistently.  Pt reaching for PT pants and would grab with right hand at PT pockets and pull and difficulty loosening pts grip.        General Comments General comments (skin integrity, edema, etc.): 98 bpm, 96% 5LO2    Exercises General Exercises - Upper Extremity Shoulder Flexion: AAROM;Both;5 reps;Supine Elbow Flexion: AAROM;Both;Supine;5 reps Elbow Extension: AAROM;Both;5 reps;Supine General Exercises - Lower Extremity Ankle Circles/Pumps: AAROM;Both;5 reps;Supine Long Arc Quad: AAROM;Both;5 reps;Seated Hip Flexion/Marching: AAROM;Both;10 reps;Supine   Assessment/Plan    PT Assessment Patient needs continued PT services  PT Problem List Decreased activity tolerance;Decreased balance;Decreased strength;Decreased  range of motion;Decreased mobility;Decreased knowledge of use of DME;Decreased safety awareness;Decreased knowledge of precautions;Cardiopulmonary status limiting activity       PT Treatment Interventions DME instruction;Gait training;Functional mobility training;Therapeutic activities;Therapeutic exercise;Balance training;Patient/family education    PT Goals (Current goals can be found in the Care Plan section)  Acute Rehab PT Goals Patient Stated Goal: to go home PT Goal Formulation: With patient Time For Goal Achievement: 11/26/20 Potential to Achieve Goals: Good    Frequency Min 3X/week   Barriers to discharge        Co-evaluation               AM-PAC PT "6 Clicks" Mobility  Outcome Measure Help needed turning from your back to your side while in a flat bed without using bedrails?: Total Help needed moving from lying on your back to sitting on the side of a flat bed without using bedrails?: Total Help needed moving to and from a bed to a chair (including a wheelchair)?: Total Help needed standing up from a chair using your arms (e.g., wheelchair or bedside chair)?: Total Help needed to walk in hospital room?: Total Help needed climbing 3-5 steps with a railing? : Total 6 Click Score: 6    End of Session Equipment Utilized During Treatment: Oxygen Activity Tolerance: Patient limited by fatigue Patient left: in bed;with call bell/phone within reach;with nursing/sitter in room;with SCD's reapplied Nurse Communication: Mobility status;Need for lift  equipment PT Visit Diagnosis: Muscle weakness (generalized) (M62.81)    Time: 1980-2217 PT Time Calculation (min) (ACUTE ONLY): 30 min   Charges:   PT Evaluation $PT Eval Moderate Complexity: 1 Mod PT Treatments $Therapeutic Activity: 8-22 mins        Shriya Aker W,PT Acute Rehabilitation Services Pager:  [phone]  Office:  [place] 11/12/2020, 1:12 PM

## 2020-11-13 ENCOUNTER — Inpatient Hospital Stay (HOSPITAL_COMMUNITY): Payer: Medicare Other

## 2020-11-13 DIAGNOSIS — I2109 ST elevation (STEMI) myocardial infarction involving other coronary artery of anterior wall: Secondary | ICD-10-CM | POA: Diagnosis not present

## 2020-11-13 DIAGNOSIS — I255 Ischemic cardiomyopathy: Secondary | ICD-10-CM

## 2020-11-13 DIAGNOSIS — I5041 Acute combined systolic (congestive) and diastolic (congestive) heart failure: Secondary | ICD-10-CM | POA: Diagnosis not present

## 2020-11-13 DIAGNOSIS — I4891 Unspecified atrial fibrillation: Secondary | ICD-10-CM | POA: Diagnosis not present

## 2020-11-13 LAB — CBC
HCT: 25.5 % — ABNORMAL LOW (ref 39.0–52.0)
Hemoglobin: 8.2 g/dL — ABNORMAL LOW (ref 13.0–17.0)
MCH: 29.8 pg (ref 26.0–34.0)
MCHC: 32.2 g/dL (ref 30.0–36.0)
MCV: 92.7 fL (ref 80.0–100.0)
Platelets: 227 10*3/uL (ref 150–400)
RBC: 2.75 MIL/uL — ABNORMAL LOW (ref 4.22–5.81)
RDW: 18 % — ABNORMAL HIGH (ref 11.5–15.5)
WBC: 12.7 10*3/uL — ABNORMAL HIGH (ref 4.0–10.5)
nRBC: 0 % (ref 0.0–0.2)

## 2020-11-13 LAB — RENAL FUNCTION PANEL
Albumin: 1.2 g/dL — ABNORMAL LOW (ref 3.5–5.0)
Albumin: 1.2 g/dL — ABNORMAL LOW (ref 3.5–5.0)
Anion gap: 13 (ref 5–15)
Anion gap: 13 (ref 5–15)
BUN: 45 mg/dL — ABNORMAL HIGH (ref 6–20)
BUN: 59 mg/dL — ABNORMAL HIGH (ref 6–20)
CO2: 20 mmol/L — ABNORMAL LOW (ref 22–32)
CO2: 22 mmol/L (ref 22–32)
Calcium: 8.5 mg/dL — ABNORMAL LOW (ref 8.9–10.3)
Calcium: 8.7 mg/dL — ABNORMAL LOW (ref 8.9–10.3)
Chloride: 100 mmol/L (ref 98–111)
Chloride: 99 mmol/L (ref 98–111)
Creatinine, Ser: 5.66 mg/dL — ABNORMAL HIGH (ref 0.61–1.24)
Creatinine, Ser: 6.5 mg/dL — ABNORMAL HIGH (ref 0.61–1.24)
GFR, Estimated: 11 mL/min — ABNORMAL LOW (ref >60)
GFR, Estimated: 9 mL/min — ABNORMAL LOW (ref >60)
Glucose, Bld: 126 mg/dL — ABNORMAL HIGH (ref 70–99)
Glucose, Bld: 94 mg/dL (ref 70–99)
Phosphorus: 4.3 mg/dL (ref 2.5–4.6)
Phosphorus: 4.6 mg/dL (ref 2.5–4.6)
Potassium: 3.8 mmol/L (ref 3.5–5.1)
Potassium: 4.4 mmol/L (ref 3.5–5.1)
Sodium: 133 mmol/L — ABNORMAL LOW (ref 135–145)
Sodium: 134 mmol/L — ABNORMAL LOW (ref 135–145)

## 2020-11-13 LAB — ECHOCARDIOGRAM LIMITED
Area-P 1/2: 4.1 cm2
Calc EF: 49.2 %
Height: 71 in
MV M vel: 4.33 m/s
MV Peak grad: 75 mmHg
S' Lateral: 3.3 cm
Single Plane A2C EF: 43.5 %
Single Plane A4C EF: 51.7 %
Weight: 2864.22 oz

## 2020-11-13 LAB — HEPARIN LEVEL (UNFRACTIONATED): Heparin Unfractionated: 0.28 IU/mL — ABNORMAL LOW (ref 0.30–0.70)

## 2020-11-13 LAB — GLUCOSE, CAPILLARY
Glucose-Capillary: 74 mg/dL (ref 70–99)
Glucose-Capillary: 71 mg/dL (ref 70–99)
Glucose-Capillary: 73 mg/dL (ref 70–99)
Glucose-Capillary: 84 mg/dL (ref 70–99)
Glucose-Capillary: 101 mg/dL — ABNORMAL HIGH (ref 70–99)

## 2020-11-13 LAB — MAGNESIUM: Magnesium: 2.3 mg/dL (ref 1.7–2.4)

## 2020-11-13 LAB — HEMOGLOBIN AND HEMATOCRIT, BLOOD
HCT: 26 % — ABNORMAL LOW (ref 39.0–52.0)
Hemoglobin: 8.4 g/dL — ABNORMAL LOW (ref 13.0–17.0)

## 2020-11-13 MED ORDER — VITAL 1.5 CAL PO LIQD
1000.0000 mL | ORAL | Status: DC
  Administered 2020-11-13 – 2020-11-16 (×2): 1000 mL
  Filled 2020-11-13: qty 1000

## 2020-11-13 MED ORDER — CLONAZEPAM 0.5 MG PO TBDP
1.0000 mg | ORAL_TABLET | Freq: Two times a day (BID) | ORAL | Status: DC
  Administered 2020-11-13 – 2020-11-16 (×7): 1 mg
  Filled 2020-11-13 (×8): qty 2

## 2020-11-13 MED ORDER — DOXERCALCIFEROL 4 MCG/2ML IV SOLN
INTRAVENOUS | Status: AC
  Administered 2020-11-13: 5 ug via INTRAVENOUS
  Filled 2020-11-13: qty 4

## 2020-11-13 MED ORDER — PROSOURCE TF PO LIQD
90.0000 mL | Freq: Four times a day (QID) | ORAL | Status: DC
  Administered 2020-11-13 – 2020-11-17 (×17): 90 mL
  Filled 2020-11-13 (×15): qty 90

## 2020-11-13 MED ORDER — COLLAGENASE 250 UNIT/GM EX OINT
TOPICAL_OINTMENT | Freq: Every day | CUTANEOUS | Status: AC
  Administered 2020-11-13 – 2020-11-24 (×4): 1 via TOPICAL
  Filled 2020-11-13 (×4): qty 30

## 2020-11-13 NOTE — Progress Notes (Signed)
  Echocardiogram 2D Echocardiogram has been performed.  Travis [name] 11/13/2020, 5:07 PM

## 2020-11-13 NOTE — Consult Note (Addendum)
WOC Nurse Consult Note: Reason for Consult: Unstageable pressure injury to bilateral buttocks.  Combination of pressure and moisture.  Having large, dark loose stools. Cleansed of incontinence during care this AM.  Wound type:pressure (unstageable) and moisture. Full thickness injury .  Not over a bony prominence.   Pressure Injury POA: No Measurement: Each gluteal fold: 10 cm x 4.5 cm with darkened center measuring 3 cm x 2 cm  Wound bed:see above  Devitalized tissue to wound bed.  Drainage (amount, consistency, odor)  Minimal serosanguinous Periwound: peeling epithelium present Dressing procedure/placement/frequency: CLeanse buttocks wound with NS and pat dry.  Apply Santyl to wound bed.  Cover with NS moist gauze  Secure with ABD and tape.. Change daily. COnsulting PT to evaluate for hydrotherapy.    Will follow and assess weekly.  Domenic Moras MSN, RN, FNP-BC CWON Wound, Ostomy, Continence Nurse Pager [phone]

## 2020-11-13 NOTE — Progress Notes (Signed)
Pt spit directly onto RN and then winked at [employer]. Pt family member at bedside. RN redirected patient that this behavior was unacceptable.

## 2020-11-13 NOTE — Progress Notes (Signed)
ANTICOAGULATION CONSULT NOTE  Pharmacy Consult for:  Heparin Indication:  atrial fibrillation   Patient Measurements: Height: 5\' 11"  (180.3 cm) Weight: 80.9 kg (178 lb 5.6 oz) IBW/kg (Calculated) : 75.3 Heparin Dosing Weight: 86.4 kg   Vital Signs: Temp: 100 F (37.8 C) (12/05 2012) Temp Source: Axillary (12/05 2012) BP: 116/74 (12/06 0000) Pulse Rate: 87 (12/06 0000)  Labs: Recent Labs    11/10/20 0346 11/10/20 1656 11/11/20 0303 11/11/20 1728 11/12/20 0314 11/12/20 1516 11/13/20 0024 11/13/20 0043  HGB 10.2*  --  10.2*  --  9.4*  --   --   --   HCT 30.9*  --  31.5*  --  29.0*  --   --   --   PLT 175  --  217  --  234  --   --   --   HEPARINUNFRC  --   --   --    < > 0.16* 0.23*  --  0.28*  CREATININE 3.99*   < > 3.48*   < > 4.93* 5.85* 6.50*  --    < > = values in this interval not displayed.    Estimated Creatinine Clearance: 13.2 mL/min (A) (by C-G formula based on SCr of 6.5 mg/dL (H)).  Assessment: 59 y.o. male with Afib and CAD awaiting PCI, for heparin  Goal of Therapy:  Heparin level 0.3-0.5 units/mL while on Cangrelor  Monitor platelets by anticoagulation protocol: Yes   Plan:  Increase Heparin 2200 units/hr  Phillis Knack, PharmD, BCPS

## 2020-11-13 NOTE — Progress Notes (Signed)
eLink Physician-Brief Progress Note Patient Name: ERCEL PEPITONE DOB: 08-30-1961 MRN: [id number]   Date of Service  11/13/2020  HPI/Events of Note  Notified of bloody sputum and black stools On heparin drip for afib and post PCI. Also on cangrelor. Currently sinus rhythm on amiodarone H/H 8.2/25.5 from 9.4/29  eICU Interventions  Discussed with bedside RN as well as wife who also at bedside. Will hold heparin for now given bleed and repeat H/H in the morning. If with significant drop will need to hold heparin drip indefinitely     Intervention Category Major Interventions: Hemorrhage - evaluation and management  Shona Needles Franco Duley 11/13/2020, 1:29 AM

## 2020-11-13 NOTE — Progress Notes (Signed)
RN stopped Heparin gtt d/t decreased H&H Levels per MD Orders. Will recheck H&H as ordered.

## 2020-11-13 NOTE — Progress Notes (Signed)
SLP Cancellation Note  Patient Details Name: MAUDE GLOOR MRN: [id number] DOB: Jun 27, 1961   Cancelled treatment:       Reason Eval/Treat Not Completed: Patient at procedure or test/unavailable. SLP was contacted by radiology and advised that the pt is currently receiving HD and will be unavailable for the next two hours. The modified barium swallow study scheduled for 1300 therefore cannot be conducted and would not be able to be scheduled at the time when pt would be available today. SLP will follow up on subsequent date.   Jayvion Stefanski I. Hardin Negus, [place], [place] Office number [phone] Pager [place] 11/13/2020, 12:58 PM

## 2020-11-13 NOTE — Progress Notes (Signed)
NAME:  Travis [name], MRN:  [id number], DOB:  01-23-1961, LOS: 34 ADMISSION DATE:  10/28/2020, CONSULTATION DATE:  10/29/20 REFERRING MD:  Cardiology - [name], CHIEF COMPLAINT:  Hypotension, gram positive cocci on culture, AMS  Brief History   Travis [name] is a 59 year old man with a history of HTN, ESRD on HD MWF, RUE AV fistula, hx of smoking, admitted 10/28/20 with fevers, myalgias for several days, acute chest pain.    History of present illness   On Admission, found to have STEMI, multivessel disease. Pulm edema with mild hypoxemia.  S/Post PCI to the LAD on 10/29/19. Planning for staged intervention to the left circumflex and possibly right coronary artery.  EF 67-61%, diastolic dysfunction, LVEDP 35. Underwent HD on 11/20 with 4 L volume removed.  Saturation improved after dialysis (100% on RA) Fevers noted 11/20. Blood cultures grew MSSA, noted this evening. .    This evening developed Afib with RVR 180s. Adenosine given, without improvement  Amiodarone infusion started per cardiology, 150mg  bolus, then drip.  Lopressor 5mg IV and 500ccNS given. Started on Phenylephrine, was on 235mcg on my arrival.   Cardioverted emergently at 120J for ongoing afib with hypotension (60/40) and AMS.  Converted to sinus.  Remained moderately hypotensive (MAP 60) on phenylephrine.   Patient was given one dose zosyn, switched to ancef once cultures grew back MSSA.   Cardiac stress tests annually, last done 11/16/19.    Past Medical History  HTN ESRD, HD MWF  Significant Hospital Events   Cardioversion 11/21 Cardiac cath stent to LAD 11/20  Consults:  PCCM  Nephrology ID  Procedures:  Central line 11/21 >> 11/29 Arterial line 11/21 Repeat DCCV 11/23 11/23 ETT HD line 11/24 Arterial line 11/24 L [place] CVC 11/30>>   Significant Diagnostic Tests:  11/24 TEE>>Left ventricular ejection fraction, by estimation, is 35 to 40%. The  left ventricle has moderately decreased function.Moderate to  severe mitral regurgitation. 11/23 CT Chest>>Extensive multifocal nodular and patchy airspace disease in both lungs with a slight peripheral predominance in the upper lungs. 3.1 cm nodular consolidative opacity in the right upper lobe is cavitated. Imaging features likely related to multifocal pneumonia. Septic emboli and metastatic disease considered less likely but not excluded. 11/22 LUE Vas Upper Extremity Doppler>> Arteriovenous fistula-Aneurysmal dilatation noted. 11/24 CT abdomen - no retroperitoneal hematoma 11/29 MR Brain>> No evidence acute intracranial abnormality, sinisitis, mastoid effusions 11/29>> MR Cervical Spine>> Given the provided history of bacteremia, facet joint septic arthritis is difficult to definitively exclude, but the lack of any surrounding marrow edema argues against this Cervical spondylosis  11/29 MR Thoracic Spine No significant marrow edema in thoracic spine , Mild thoracic spondylosis hypointense marrow signal throughout the thoracic spine, likely related to the patient's end-stage renal disease. multifocal airspace disease and cavitary pulmonary lesions. Small right pleural effusion. 11/29 MR Lumbar Spine:As noted above and Suspected cholecystolithiasis  Micro Data:  11/21 BCx2>>Staph aureus > MSSA by BCID 11/24 BCx2>> 11/30 BC x 2>> staph epi>>  Antimicrobials:  Zosyn 11/21 x 1 Ancef 11/21 ->  Interim history/subjective:  Still delirious. Still not tolerating PO. Question of melena and hemoptysis overnight so heparin held. MBSS scheduled for today.  Objective   Blood pressure 121/75, pulse 93, temperature 99.7 F (37.6 C), temperature source Axillary, resp. rate (!) 31, height 5\' 11"  (1.803 m), weight 83.4 kg, SpO2 93 %. CVP:  [4 mmHg-5 mmHg] 4 mmHg      Intake/Output Summary (Last 24 hours) at 11/13/2020 0820 Last  data filed at 11/13/2020 0600 Gross per 24 hour  Intake 2482.39 ml  Output --  Net 2482.39 ml   Filed Weights   11/11/20  0500 11/12/20 0500 11/13/20 0458  Weight: 78.8 kg 80.9 kg 83.4 kg   Constitutional: no acute distress  Eyes: EOMI, pupils equal Ears, nose, mouth, and throat: MMM, trachea midline Cardiovascular: RRR, +SEM, ext warm Respiratory: scattered rhonci, no accessory muscle use Gastrointestinal: Soft, +BS Skin: No rashes, normal turgor Neurologic: Moves all 4 ext to command Psychiatric: tangential thought processes, question hallucinations, oriented to self/place but not time or situation, fluctuating levels of alertness   Sugars low on D5@50cc /hr H/H slightly down Plts stable  Resolved Hospital Problem list     Assessment & Plan:   Anterior STEMI, CAD, acute CHF exacerbation- will need definitive PCI once recovers Afib s/p amio now sinus rhythm- H/H drop with heparin challege 12/5 MSSA bacteremia, source thought HD site on cefazolin ESRD on HD UGIB- secondary to severe esophagitis and duodenal ulcer question ischemic s/p EGD on 11/09/20 Constipation- resolved Acute toxic metabolic encephalopathy, ICU delirium- still an issue Acute hypoxemic respiratory failure- off vent, still having issues with toileting due to weakness and delirium Pressure ulcer- not POA  - Encourage IS, flutter - Turning as able - Appreciate PT/OT input - Need to get PO access before we can wean off precedex - MBSS today, if fails, need to consider IR guided NGT vs. TPN tomorrow - Continue prolonged course of abx - Continue PPI - Re-orient, encourage day/night cycles - Continue cangrelor until can take PO consistently - Hold heparin, watch h/h for now - WOC consult for local care of stage 2 sacral ulcer  Best practice:  Diet: pending MBSS Pain/Anxiety/Delirium protocol (if indicated): haldol PRN, re-orientation VAP protocol (if indicated): off DVT prophylaxis: SCDs for now GI prophylaxis: protonix Glucose control: supplemental IV glucose being given until can take PO consistently Mobility:  Bedrest Code Status: Full  Family Communication: wife at bedside daily updated Disposition: ICU pending precedex liberation and tolerance of iHD   Patient critically ill due to acute metabolic encephalopathy Interventions to address this today weaning precedex Risk of deterioration without these interventions is high  I personally spent 32 minutes providing critical care not including any separately billable procedures  Erskine Emery MD [facility] 11/13/2020 8:20 AM Personal pager: #[phone] If unanswered, please page CCM On-call: [phone]

## 2020-11-13 NOTE — Plan of Care (Signed)
  Problem: Activity: Goal: Ability to return to baseline activity level will improve Outcome: Progressing   Problem: Cardiovascular: Goal: Ability to achieve and maintain adequate cardiovascular perfusion will improve Outcome: Progressing   Problem: Health Behavior/Discharge Planning: Goal: Ability to safely manage health-related needs after discharge will improve Outcome: Progressing   Problem: Clinical Measurements: Goal: Ability to maintain clinical measurements within normal limits will improve Outcome: Progressing Goal: Will remain free from infection Outcome: Progressing Goal: Diagnostic test results will improve Outcome: Progressing Goal: Respiratory complications will improve Outcome: Progressing Goal: Cardiovascular complication will be avoided Outcome: Progressing   Problem: Coping: Goal: Level of anxiety will decrease Outcome: Progressing   Problem: Elimination: Goal: Will not experience complications related to bowel motility Outcome: Progressing   Problem: Pain Managment: Goal: General experience of comfort will improve Outcome: Progressing   Problem: Safety: Goal: Ability to remain free from injury will improve Outcome: Progressing   Problem: Skin Integrity: Goal: Risk for impaired skin integrity will decrease Outcome: Progressing   Problem: Activity: Goal: Ability to tolerate increased activity will improve Outcome: Progressing   Problem: Respiratory: Goal: Ability to maintain a clear airway and adequate ventilation will improve Outcome: Progressing   Problem: Role Relationship: Goal: Method of communication will improve Outcome: Progressing   Problem: Education: Goal: Understanding of CV disease, CV risk reduction, and recovery process will improve Outcome: Not Progressing   Problem: Education: Goal: Knowledge of General Education information will improve Description: Including pain rating scale, medication(s)/side effects and  non-pharmacologic comfort measures Outcome: Not Progressing   Problem: Health Behavior/Discharge Planning: Goal: Ability to manage health-related needs will improve Outcome: Not Progressing   Problem: Activity: Goal: Risk for activity intolerance will decrease Outcome: Not Progressing   Problem: Nutrition: Goal: Adequate nutrition will be maintained Outcome: Not Progressing

## 2020-11-13 NOTE — Progress Notes (Signed)
[name] NEPHROLOGY PROGRESS NOTE  Assessment/ Plan: Pt is a 59 y.o. yo male HTN, ESRD on HD admitted on 11/20 with fever, myalgia found to have NSTEMI, complicated by A. fib with RVR, bacteremia, GI bleed and VDRF.  OP HD:AF MWF 4h 450/800 81.5kg 2/2.25 bath RUE AVF Hep 5000+ 1000 mid-run - hect 5 ug tiw  - mircera 50 ugIVq 4 weeks, last 11/15 (due 11/29)  # ESRD - usual HD MWF.  Required CRRT on and off from 11/24-12/4. Plan for IHD today in ICU.  May need low-dose Levophed during dialysis.  Continue to monitor.  # Acute STEMI- S/P LAD PCI11/20, has otherdisease inLCx/ RCA.Transitioned to brilinta from cangrelor. Per cardiology.  #MSSA cavitary PNA / bacteremia-intubated 11/23. Per PCCM. Respiratory culture 11/23 with MSSA. Blood cx +11/21.CT abd/ pelvis negative 11/23.F/U bcx's negative.Dialysis duplex 11/22 without abscessandUS of AVF wasnegative 11/27. Plan for 6 weeks of IV cefazolin per pharmacy ending 12/31/2019 .  #PEA arrest: occurred in setting of agitation/ sedation.Brief CPR.  # Shock - resolved  #Anemia ckd/ABLA - S/P PRBCs. HGB 8s  #Secondary hyperparathyroidism: Phosphorus acceptable, binders on hold.  Continue to monitor.  #Hyponatremia now managed with dialysis.  #Afib:on amio and hep gtt, s/p DCCV.   #Severe MR:per cardiology.  Subjective: Seen and examined ICU.  He is extubated and on high flow oxygen.  Following some commands.  Review of system limited.  His wife at bedside. Objective Vital signs in last 24 hours: Vitals:   11/13/20 0458 11/13/20 0500 11/13/20 0600 11/13/20 0750  BP:  118/84 121/75   Pulse:  91 93   Resp:  (!) 28 (!) 31   Temp:    99.7 F (37.6 C)  TempSrc:    Axillary  SpO2:  93% 93%   Weight: 83.4 kg     Height:       Weight change: 2.5 kg  Intake/Output Summary (Last 24 hours) at 11/13/2020 0854 Last data filed at 11/13/2020 0600 Gross per 24 hour  Intake 2482.39 ml   Output --  Net 2482.39 ml       Labs: Basic Metabolic Panel: Recent Labs  Lab 11/12/20 0314 11/12/20 1516 11/13/20 0024  NA 134* 131* 133*  K 4.0 4.2 4.4  CL 100 97* 100  CO2 21* 20* 20*  GLUCOSE 102* 95 94  BUN 43* 55* 59*  CREATININE 4.93* 5.85* 6.50*  CALCIUM 9.0 9.1 8.7*  PHOS 3.5 4.2 4.6   Liver Function Tests: Recent Labs  Lab 11/08/20 0347 11/08/20 1706 11/12/20 0314 11/12/20 1516 11/13/20 0024  AST 53*  --   --   --   --   ALT 5  --   --   --   --   ALKPHOS 137*  --   --   --   --   BILITOT 1.9*  --   --   --   --   PROT 5.8*  --   --   --   --   ALBUMIN 1.3*   < > 1.2* 1.3* 1.2*   < > = values in this interval not displayed.   No results for input(s): LIPASE, AMYLASE in the last 168 hours. No results for input(s): AMMONIA in the last 168 hours. CBC: Recent Labs  Lab 11/09/20 2140 11/09/20 2140 11/10/20 0346 11/10/20 0346 11/11/20 0303 11/11/20 0303 11/12/20 0314 11/13/20 0023 11/13/20 0453  WBC 13.5*   < > 11.7*   < > 12.3*  --  12.7* 12.7*  --  HGB 9.4*   < > 10.2*   < > 10.2*   < > 9.4* 8.2* 8.4*  HCT 29.0*   < > 30.9*   < > 31.5*   < > 29.0* 25.5* 26.0*  MCV 91.8  --  91.2  --  91.3  --  92.4 92.7  --   PLT 172   < > 175   < > 217  --  234 227  --    < > = values in this interval not displayed.   Cardiac Enzymes: No results for input(s): CKTOTAL, CKMB, CKMBINDEX, TROPONINI in the last 168 hours. CBG: Recent Labs  Lab 11/12/20 1522 11/12/20 2010 11/12/20 2354 11/13/20 0414 11/13/20 0747  GLUCAP 72 74 82 74 71    Iron Studies: No results for input(s): IRON, TIBC, TRANSFERRIN, FERRITIN in the last 72 hours. Studies/Results: No results found.  Medications: Infusions: . sodium chloride Stopped (11/10/20 1944)  . sodium chloride Stopped (10/28/20 0954)  . sodium chloride Stopped (11/04/20 0016)  . sodium chloride    . sodium chloride    . amiodarone 30 mg/hr (11/13/20 0835)  . cangrelor 50 mg in NS 250 mL 0.75 mcg/kg/min  (11/13/20 0600)  .  ceFAZolin (ANCEF) IV    . dexmedetomidine (PRECEDEX) IV infusion 0.7 mcg/kg/hr (11/13/20 0600)  . dextrose 50 mL/hr at 11/13/20 0600  . norepinephrine (LEVOPHED) Adult infusion Stopped (11/13/20 0428)  . propofol (DIPRIVAN) infusion Stopped (11/09/20 1520)    Scheduled Medications: . sodium chloride   Intravenous Once  . atorvastatin  80 mg Per Tube q1800  . B-complex with vitamin C  1 tablet Per Tube Daily  . chlorhexidine gluconate (MEDLINE KIT)  15 mL Mouth Rinse BID  . Chlorhexidine Gluconate Cloth  6 each Topical Q0600  . clonazepam  1 mg Oral BID  . cloNIDine  0.2 mg Transdermal Weekly  . docusate  100 mg Per Tube BID  . doxercalciferol  5 mcg Intravenous Q M,W,F  . insulin aspart  0-6 Units Subcutaneous Q4H  . lidocaine  1 patch Transdermal Q24H  . mouth rinse  15 mL Mouth Rinse 10 times per day  . metoCLOPramide (REGLAN) injection  10 mg Intravenous Q12H  . pantoprazole (PROTONIX) IV  40 mg Intravenous Q12H  . polyethylene glycol  17 g Per Tube Daily  . QUEtiapine  100 mg Per Tube BID  . senna  1 tablet Per Tube QHS  . sevelamer carbonate  0.8 g Per Tube TID  . sodium chloride flush  10-40 mL Intracatheter Q12H  . sodium chloride flush  3 mL Intravenous Q12H    have reviewed scheduled and prn medications.  Physical Exam: General: Ill-looking male lying on bed with high flow oxygen, following simple commands Heart:RRR, s1s2 nl Lungs: Basal rhonchi, no wheezing Abdomen:soft, Non-tender, non-distended Extremities:No edema Dialysis Access: Has catheter,  AV fistula in place.  Travis [name] 11/13/2020,8:54 AM  LOS: 16 days  Pager: [phone]

## 2020-11-13 NOTE — Progress Notes (Signed)
OT Cancellation Note  Patient Details Name: Travis [name] MRN: [id number] DOB: 1961/03/06   Cancelled Treatment:    Reason Eval/Treat Not Completed: Patient at procedure or test/ unavailable. HD. Will return as schedule allows.   [place], OTR/L Acute Rehab Pager: [phone] Office: [phone] 11/13/2020, 2:18 PM

## 2020-11-13 NOTE — Progress Notes (Signed)
  Speech Language Pathology Treatment: Dysphagia  Patient Details Name: Travis [name] MRN: [id number] DOB: [birth year] Today's Date: 11/13/2020 Time: 4536-4680 SLP Time Calculation (min) (ACUTE ONLY): 21 min  Assessment / Plan / Recommendation Clinical Impression  Pt was seen for dysphagia treatment with his wife present. Pt was alert and intermittently verbally abusive towards the SLP. Vocal quality was mildly hoarse with reduced vocal intensity. Pt's wife reported that it was characteristic of his voice after dialysis. Pt's acceptance of trials was somewhat limited due to his participation. He inconsistently tolerated 1/2 tsp boluses of puree but exhibited coughing with larger boluses of puree solids and with thin liquids via straw. He exhibited impulsive tendencies and the straw was withdrawn by the SLP following the ninth consecutive bolus of thin liquids via straw. Throat clearing was observed with thin liquids via tsp. Pt demonstrated improved bolus awareness during this session, but continues to be symptomatic of aspiration with trials. Considering prolonged length of intubation and his presentation, a modified barium swallow study is recommended to further assess physiology. It is currently scheduled for 1300; however, due to his presentation at bedside, SLP does question his level of participation in this study.    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 He describes his chest tightness, 7/10, with radiation to his back (between his scapula), he has not exerted. EKG in ED is suggestive of anterolateral ST elevations. In the ED he was given thorazine for hiccups; CXR 10/31/20 indicated New patchy right mid and lower lung infiltrate; CT chest 10/31/20 yielded results including: Extensive multifocal nodular and patchy airspace disease in  both lungs with a slight peripheral predominance in the upper lungs. 3.1 cm nodular consolidative opacity in the right upper lobe is cavitated. Imaging features likely related to multifocal pneumonia. Pt intubated 11/23-12/3/21; gastroenterology consult 11/09/20 with UGI completed and results as follows: Moderately severe reflux and erosive esophagitis  with no bleeding; Hematin (altered blood/coffee-ground-like material) in the gastric antrum, in the cardia, in  the gastric fundus and in the gastric body. Fluid aspiration performed. Probable ischemic duodenitis. Normal third portion of the duodenum. The examination was otherwise normal. BSE generated.      SLP Plan  MBS       Recommendations  Diet recommendations: NPO Medication Administration: Via alternative means                Oral Care Recommendations: Oral care QID Follow up Recommendations: Other (comment) (TBD) SLP Visit Diagnosis: Dysphagia, unspecified (R13.10) Plan: MBS       Coen Miyasato I. Hardin Negus, [place], [place] Office number [phone] Pager [place] 11/13/2020, 10:04 AM

## 2020-11-13 NOTE — Procedures (Signed)
Cortrak  Person Inserting Tube:  Travis [name], RD Tube Type:  Cortrak - 43 inches Tube Location:  Right nare Initial Placement:  Stomach Secured by: Bridle Technique Used to Measure Tube Placement:  Documented cm marking at nare/ corner of mouth Cortrak Secured At:  67 cm   No x-ray is required. RN may begin using tube.   If the tube becomes dislodged please keep the tube and contact the Cortrak team at [email or web address] ([email or web address]) for replacement.  If after hours and replacement cannot be delayed, place a NG tube and confirm placement with an abdominal x-ray.    Mariana Single RD, LDN Clinical Nutrition Pager listed in [place]

## 2020-11-13 NOTE — Procedures (Signed)
Echo attempted. Patient just finishing dialysis. Will attempt again later.

## 2020-11-13 NOTE — Progress Notes (Signed)
This pts RN asked me to help reposition the pt, when I approached the bedside the pt said "I'm going to f**k you up". When I asked what he said, he went on to say it again. I told him that it was not nice and he stated it one last time. The next time I was asked to help reposition I entered and the pt called me "Smurf".

## 2020-11-13 NOTE — Procedures (Signed)
Echo attempted. Patient care in progress. Will attempt again later.

## 2020-11-13 NOTE — Progress Notes (Signed)
Progress Note  Patient Name: Travis [name] Date of Encounter: 11/13/2020  [facility] Cardiologist: Glenetta Hew, MD   Subjective   No complaints  Inpatient Medications    Scheduled Meds: . sodium chloride   Intravenous Once  . atorvastatin  80 mg Per Tube q1800  . B-complex with vitamin C  1 tablet Per Tube Daily  . chlorhexidine gluconate (MEDLINE KIT)  15 mL Mouth Rinse BID  . Chlorhexidine Gluconate Cloth  6 each Topical Q0600  . clonazepam  1 mg Oral BID  . cloNIDine  0.2 mg Transdermal Weekly  . docusate  100 mg Per Tube BID  . doxercalciferol  5 mcg Intravenous Q M,W,F  . insulin aspart  0-6 Units Subcutaneous Q4H  . lidocaine  1 patch Transdermal Q24H  . mouth rinse  15 mL Mouth Rinse 10 times per day  . metoCLOPramide (REGLAN) injection  10 mg Intravenous Q12H  . pantoprazole (PROTONIX) IV  40 mg Intravenous Q12H  . polyethylene glycol  17 g Per Tube Daily  . QUEtiapine  100 mg Per Tube BID  . senna  1 tablet Per Tube QHS  . sevelamer carbonate  0.8 g Per Tube TID  . sodium chloride flush  10-40 mL Intracatheter Q12H  . sodium chloride flush  3 mL Intravenous Q12H   Continuous Infusions: . sodium chloride Stopped (11/10/20 1944)  . sodium chloride Stopped (10/28/20 0954)  . sodium chloride Stopped (11/04/20 0016)  . sodium chloride    . sodium chloride    . amiodarone 30 mg/hr (11/13/20 0600)  . cangrelor 50 mg in NS 250 mL 0.75 mcg/kg/min (11/13/20 0600)  .  ceFAZolin (ANCEF) IV    . dexmedetomidine (PRECEDEX) IV infusion 0.7 mcg/kg/hr (11/13/20 0600)  . dextrose 50 mL/hr at 11/13/20 0600  . heparin Stopped (11/13/20 0126)  . norepinephrine (LEVOPHED) Adult infusion Stopped (11/13/20 0428)  . propofol (DIPRIVAN) infusion Stopped (11/09/20 1520)   PRN Meds: sodium chloride, sodium chloride, sodium chloride, acetaminophen, bisacodyl, fentaNYL (SUBLIMAZE) injection, haloperidol lactate, lidocaine (PF), lidocaine-prilocaine, LORazepam, ondansetron  (ZOFRAN) IV, pentafluoroprop-tetrafluoroeth, sodium chloride flush, sodium chloride flush   Vital Signs    Vitals:   11/13/20 0458 11/13/20 0500 11/13/20 0600 11/13/20 0750  BP:  118/84 121/75   Pulse:  91 93   Resp:  (!) 28 (!) 31   Temp:    99.7 F (37.6 C)  TempSrc:    Axillary  SpO2:  93% 93%   Weight: 83.4 kg     Height:        Intake/Output Summary (Last 24 hours) at 11/13/2020 0825 Last data filed at 11/13/2020 0600 Gross per 24 hour  Intake 2482.39 ml  Output --  Net 2482.39 ml   Last 3 Weights 11/13/2020 11/12/2020 11/11/2020  Weight (lbs) 183 lb 13.8 oz 178 lb 5.6 oz 173 lb 11.6 oz  Weight (kg) 83.4 kg 80.9 kg 78.8 kg      Telemetry    Sinus, pause noted - Personally Reviewed  ECG    NSR, septal infarct-personally reviewed.   Physical Exam   General: Well developed, well nourished, NAD  HEENT: OP clear, mucus membranes moist  SKIN: warm, dry. No rashes. Neuro: No focal deficits  Musculoskeletal: Muscle strength 5/5 all ext  Psychiatric: awake, flat affect Neck: No JVD Lungs:Clear bilaterally, no wheezes, rhonci, crackles Cardiovascular: Regular rate and rhythm. No murmurs, gallops or rubs. Abdomen:Soft. Bowel sounds present. Non-tender.  Extremities: No lower extremity edema. Pulses are 2 + in  the bilateral DP/PT.    Labs    High Sensitivity Troponin:   Recent Labs  Lab 10/28/20 0629 10/28/20 1046 10/28/20 2040 10/29/20 2121 10/30/20 0548  TROPONINIHS 3,155* >27,000* >27,000* >27,000* >27,000*      Chemistry Recent Labs  Lab 11/08/20 0347 11/08/20 1706 11/12/20 0314 11/12/20 1516 11/13/20 0024  NA 134*   < > 134* 131* 133*  K 5.5*   < > 4.0 4.2 4.4  CL 101   < > 100 97* 100  CO2 16*   < > 21* 20* 20*  GLUCOSE 89   < > 102* 95 94  BUN 112*   < > 43* 55* 59*  CREATININE 7.43*   < > 4.93* 5.85* 6.50*  CALCIUM 9.4   < > 9.0 9.1 8.7*  PROT 5.8*  --   --   --   --   ALBUMIN 1.3*   < > 1.2* 1.3* 1.2*  AST 53*  --   --   --   --   ALT  5  --   --   --   --   ALKPHOS 137*  --   --   --   --   BILITOT 1.9*  --   --   --   --   GFRNONAA 8*   < > 13* 10* 9*  ANIONGAP 17*   < > 13 14 13    < > = values in this interval not displayed.     Hematology Recent Labs  Lab 11/11/20 0303 11/11/20 0303 11/12/20 0314 11/13/20 0023 11/13/20 0453  WBC 12.3*  --  12.7* 12.7*  --   RBC 3.45*  --  3.14* 2.75*  --   HGB 10.2*   < > 9.4* 8.2* 8.4*  HCT 31.5*   < > 29.0* 25.5* 26.0*  MCV 91.3  --  92.4 92.7  --   MCH 29.6  --  29.9 29.8  --   MCHC 32.4  --  32.4 32.2  --   RDW 18.5*  --  18.3* 18.0*  --   PLT 217  --  234 227  --    < > = values in this interval not displayed.    BNPNo results for input(s): BNP, PROBNP in the last 168 hours.   DDimer No results for input(s): DDIMER in the last 168 hours.   Radiology    No results found.  Cardiac Studies     Patient Profile     59 y.o. male with ESRD on HD, HTN, tobacco abuse, CAD  presenting with anterior STEMI 10/28/20 s/p placement of a stent in the LAD with residual disease in the RCA and Circumflex. He developed atrial fib with RVR. Hospital course complicated by MSSA bacteremia with shock, upper GI bleeding secondary to severe esophagitis and duodenal ulcer, ICU delirium, metabolic encephalopathy, acute hypoxemic respiratory failure, anemia.   Assessment & Plan    1) CAD/Anterior STEMI: He is on Cangelor and a statin. Plan for staged PCI when other issues stable.  He is not close to that right now. IV heparin stopped this am due to anemia and GI bleeding  2) ESRD: Dialysis per renal.   3) Atrial fibrillation: Sinus today on amiodarone drip.   4. Ischemic cardiomyopathy: LVEF=35-40% by echo 11/01/20.   Findings discussed with the patient's family at bedside. No new cardiac recommendations today  For questions or updates, please contact [place] Please consult [email or web address] for contact info under  Signed, Lauree Chandler, MD  11/13/2020,  8:25 AM

## 2020-11-13 NOTE — Progress Notes (Signed)
ANTICOAGULATION CONSULT NOTE  Pharmacy Consult for:  Heparin Indication:  atrial fibrillation   Patient Measurements: Height: 5\' 11"  (180.3 cm) Weight: 83.4 kg (183 lb 13.8 oz) IBW/kg (Calculated) : 75.3 Heparin Dosing Weight: 86.4 kg   Vital Signs: Temp: 99.7 F (37.6 C) (12/06 0750) Temp Source: Axillary (12/06 0750) BP: 121/75 (12/06 0600) Pulse Rate: 93 (12/06 0600)  Labs: Recent Labs    11/11/20 0303 11/11/20 1728 11/12/20 0314 11/12/20 0314 11/12/20 1516 11/13/20 0023 11/13/20 0024 11/13/20 0043 11/13/20 0453  HGB 10.2*  --  9.4*   < >  --  8.2*  --   --  8.4*  HCT 31.5*  --  29.0*  --   --  25.5*  --   --  26.0*  PLT 217  --  234  --   --  227  --   --   --   HEPARINUNFRC  --    < > 0.16*  --  0.23*  --   --  0.28*  --   CREATININE 3.48*   < > 4.93*  --  5.85*  --  6.50*  --   --    < > = values in this interval not displayed.    Estimated Creatinine Clearance: 13.2 mL/min (A) (by C-G formula based on SCr of 6.5 mg/dL (H)).  Assessment: 59 y.o. male with Afib and CAD awaiting PCI, for heparin.   Currently in nsr, hgb continues to drop. Holding heparin for now, cangrelor continues at low rate. Will check p2y12 to check plt function and adjust as needed.   Goal of Therapy:  Heparin level 0.3-0.5 units/mL while on Cangrelor  Monitor platelets by anticoagulation protocol: Yes   Plan:  Hold heparin for now  Erin Hearing PharmD., BCPS Clinical Pharmacist 11/13/2020 8:30 AM

## 2020-11-13 NOTE — Progress Notes (Signed)
Nutrition Follow-up  DOCUMENTATION CODES:   Severe malnutrition in context of chronic illness  INTERVENTION:   Tube Feeding via Cortrak:  Vital 1.5 @ 55 ml/hr Begin TF at 20 ml/hr; titrate by 10 mL q 8 hours until goal rate of 55 ml/hr 90 ml ProSource TF QID Provides: 2300 kcal, 177 grams protein, and 1003 ml free water.    NUTRITION DIAGNOSIS:   Severe Malnutrition related to chronic illness as evidenced by severe muscle depletion, severe fat depletion.  Being addressed via TF   GOAL:   Patient will meet greater than or equal to 90% of their needs  Progressing   MONITOR:   I & O's, Labs, Skin  REASON FOR ASSESSMENT:   Ventilator    ASSESSMENT:   Patient with PMH significant for HTN and ESRD in HD. Presents this admission with STEMI.  11/20 PCI to LAD 11/23Intubated 11/24 OG tube in esophagus, unable to advance 11/26 Cortrak attempted but unable to advance past 55 cm, likely at GE junction. DR advanced to duodenal bulb; TF initiated 11/30 Brown malodorous output from mouth and OG, TF held, CT negative for obstruction, ileus with no BM x 10 days. Abd xray with OG tube GE junction and Cortrak now retraced back into stomach; both needing advancement 12/01 iHD not tolerated, CRRT to resume, +large amount of liquid stool post enema, Attempted Cortrak advancement but unsuccessful 12/2 pt with large volume blood from OG tube; s/p EGD with noted severe esophagitis and erosions/ulcerations in duodenal bulb compatible with ischemia (probable ischemic duodenitis)   12/3 extubated; attempted cortrak placement unsuccessful  Remains NPO. SLP recommending MBS  Cortrak placed today, plan to start TF  Current wt 81.2 kg post iHD today. Outpatient EDW 81.5 kg but lowest wt of 76 kg this admission  Labs: sodium 133 (L), albumin 1.2, corrected calcium 10.9 Meds: B-complex with C, D5 at 50, hectoral, reglan, renvela  Diet Order:   Diet Order            Diet NPO time specified   Diet effective now                 EDUCATION NEEDS:   Not appropriate for education at this time  Skin:  Skin Assessment: (P) Skin Integrity Issues: (skin tear vs pressure injury to sacrum noted today, not yet documented by RN) Skin Integrity Issues:: Stage IV Stage II: n/a Stage IV: buttocks  Last BM:  12/6  Height:   Ht Readings from Last 1 Encounters:  10/28/20 5\' 11"  (1.803 m)    Weight:   Wt Readings from Last 1 Encounters:  11/13/20 83.4 kg    BMI:  Body mass index is 25.64 kg/m.  Estimated Nutritional Needs:   Kcal:  2250-2500  Protein:  160-197 grams  Fluid:  1000 mL plus UOP   Kerman Passey MS, RDN, LDN, CNSC Registered Dietitian III Clinical Nutrition RD Pager and On-Call Pager Number Located in [place]

## 2020-11-14 ENCOUNTER — Inpatient Hospital Stay (HOSPITAL_COMMUNITY): Payer: Medicare Other

## 2020-11-14 DIAGNOSIS — I2109 ST elevation (STEMI) myocardial infarction involving other coronary artery of anterior wall: Secondary | ICD-10-CM | POA: Diagnosis not present

## 2020-11-14 DIAGNOSIS — E43 Unspecified severe protein-calorie malnutrition: Secondary | ICD-10-CM | POA: Insufficient documentation

## 2020-11-14 LAB — CBC
HCT: 24.5 % — ABNORMAL LOW (ref 39.0–52.0)
HCT: 26.4 % — ABNORMAL LOW (ref 39.0–52.0)
Hemoglobin: 7.7 g/dL — ABNORMAL LOW (ref 13.0–17.0)
Hemoglobin: 8.8 g/dL — ABNORMAL LOW (ref 13.0–17.0)
MCH: 29.6 pg (ref 26.0–34.0)
MCH: 30.6 pg (ref 26.0–34.0)
MCHC: 31.4 g/dL (ref 30.0–36.0)
MCHC: 33.3 g/dL (ref 30.0–36.0)
MCV: 94.2 fL (ref 80.0–100.0)
MCV: 91.7 fL (ref 80.0–100.0)
Platelets: 227 10*3/uL (ref 150–400)
Platelets: 228 10*3/uL (ref 150–400)
RBC: 2.6 MIL/uL — ABNORMAL LOW (ref 4.22–5.81)
RBC: 2.88 MIL/uL — ABNORMAL LOW (ref 4.22–5.81)
RDW: 17.6 % — ABNORMAL HIGH (ref 11.5–15.5)
RDW: 17.4 % — ABNORMAL HIGH (ref 11.5–15.5)
WBC: 12 10*3/uL — ABNORMAL HIGH (ref 4.0–10.5)
WBC: 9.5 10*3/uL (ref 4.0–10.5)
nRBC: 0 % (ref 0.0–0.2)
nRBC: 0 % (ref 0.0–0.2)

## 2020-11-14 LAB — GLUCOSE, CAPILLARY
Glucose-Capillary: 105 mg/dL — ABNORMAL HIGH (ref 70–99)
Glucose-Capillary: 105 mg/dL — ABNORMAL HIGH (ref 70–99)
Glucose-Capillary: 114 mg/dL — ABNORMAL HIGH (ref 70–99)
Glucose-Capillary: 117 mg/dL — ABNORMAL HIGH (ref 70–99)
Glucose-Capillary: 128 mg/dL — ABNORMAL HIGH (ref 70–99)
Glucose-Capillary: 92 mg/dL (ref 70–99)

## 2020-11-14 LAB — RENAL FUNCTION PANEL
Albumin: 1.1 g/dL — ABNORMAL LOW (ref 3.5–5.0)
Anion gap: 14 (ref 5–15)
BUN: 63 mg/dL — ABNORMAL HIGH (ref 6–20)
CO2: 20 mmol/L — ABNORMAL LOW (ref 22–32)
Calcium: 8.8 mg/dL — ABNORMAL LOW (ref 8.9–10.3)
Chloride: 98 mmol/L (ref 98–111)
Creatinine, Ser: 6.61 mg/dL — ABNORMAL HIGH (ref 0.61–1.24)
GFR, Estimated: 9 mL/min — ABNORMAL LOW (ref >60)
Glucose, Bld: 122 mg/dL — ABNORMAL HIGH (ref 70–99)
Phosphorus: 5.7 mg/dL — ABNORMAL HIGH (ref 2.5–4.6)
Potassium: 3.9 mmol/L (ref 3.5–5.1)
Sodium: 132 mmol/L — ABNORMAL LOW (ref 135–145)

## 2020-11-14 LAB — CULTURE, BLOOD (ROUTINE X 2)
Culture: NO GROWTH
Culture: NO GROWTH
Special Requests: ADEQUATE
Special Requests: ADEQUATE

## 2020-11-14 LAB — MAGNESIUM: Magnesium: 2.2 mg/dL (ref 1.7–2.4)

## 2020-11-14 MED ORDER — HEPARIN SODIUM (PORCINE) 1000 UNIT/ML DIALYSIS
1000.0000 [IU] | INTRAMUSCULAR | Status: DC | PRN
  Administered 2020-11-15: 12:00:00 1000 [IU] via INTRAVENOUS_CENTRAL
  Administered 2020-11-17: 18:00:00 3100 [IU] via INTRAVENOUS_CENTRAL
  Filled 2020-11-14 (×5): qty 1

## 2020-11-14 MED ORDER — GUAIFENESIN 100 MG/5ML PO SOLN
15.0000 mL | ORAL | Status: DC
  Administered 2020-11-14 – 2020-11-19 (×27): 300 mg
  Filled 2020-11-14 (×5): qty 15
  Filled 2020-11-14: qty 30
  Filled 2020-11-14 (×5): qty 15
  Filled 2020-11-14: qty 10
  Filled 2020-11-14: qty 15
  Filled 2020-11-14: qty 5
  Filled 2020-11-14: qty 15
  Filled 2020-11-14 (×2): qty 5
  Filled 2020-11-14 (×6): qty 15
  Filled 2020-11-14 (×3): qty 5
  Filled 2020-11-14 (×3): qty 15

## 2020-11-14 MED ORDER — SODIUM CHLORIDE 0.9 % IV SOLN: 100.0000 mL | INTRAVENOUS | Status: DC | PRN

## 2020-11-14 MED ORDER — SEVELAMER CARBONATE 0.8 G PO PACK
1.6000 g | PACK | Freq: Three times a day (TID) | ORAL | Status: DC
  Administered 2020-11-14 – 2020-11-24 (×30): 1.6 g
  Filled 2020-11-14 (×34): qty 2

## 2020-11-14 MED ORDER — PENTAFLUOROPROP-TETRAFLUOROETH EX AERO: 1.0000 "application " | INHALATION_SPRAY | CUTANEOUS | Status: DC | PRN

## 2020-11-14 MED ORDER — FERRIC CITRATE 1 GM 210 MG(FE) PO TABS: 420.0000 mg | ORAL_TABLET | Freq: Three times a day (TID) | ORAL | Status: DC

## 2020-11-14 MED ORDER — SALINE SPRAY 0.65 % NA SOLN
1.0000 | NASAL | Status: DC | PRN
  Administered 2020-11-15 (×2): 1 via NASAL
  Filled 2020-11-14 (×2): qty 44

## 2020-11-14 MED ORDER — TICAGRELOR 90 MG PO TABS
90.0000 mg | ORAL_TABLET | Freq: Two times a day (BID) | ORAL | Status: DC
  Administered 2020-11-14 – 2020-11-24 (×20): 90 mg
  Filled 2020-11-14 (×20): qty 1

## 2020-11-14 MED ORDER — MIDODRINE HCL 5 MG PO TABS: 5.0000 mg | ORAL_TABLET | Freq: Three times a day (TID) | ORAL | Status: DC

## 2020-11-14 MED ORDER — AMIODARONE HCL 200 MG PO TABS
200.0000 mg | ORAL_TABLET | Freq: Every day | ORAL | Status: DC
  Administered 2020-11-14 – 2020-11-19 (×6): 200 mg
  Filled 2020-11-14 (×6): qty 1

## 2020-11-14 MED ORDER — LIDOCAINE HCL (PF) 1 % IJ SOLN: 5.0000 mL | INTRAMUSCULAR | Status: DC | PRN

## 2020-11-14 MED ORDER — TICAGRELOR 90 MG PO TABS: 90.0000 mg | ORAL_TABLET | Freq: Two times a day (BID) | ORAL | Status: DC

## 2020-11-14 MED ORDER — MIDODRINE HCL 5 MG PO TABS
5.0000 mg | ORAL_TABLET | Freq: Three times a day (TID) | ORAL | Status: DC
  Administered 2020-11-14 – 2020-11-15 (×3): 5 mg
  Filled 2020-11-14 (×3): qty 1

## 2020-11-14 MED ORDER — TICAGRELOR 90 MG PO TABS
180.0000 mg | ORAL_TABLET | Freq: Once | ORAL | Status: AC
  Administered 2020-11-14: 180 mg
  Filled 2020-11-14: qty 2

## 2020-11-14 MED ORDER — ALTEPLASE 2 MG IJ SOLR: 2.0000 mg | Freq: Once | INTRAMUSCULAR | Status: DC | PRN

## 2020-11-14 MED ORDER — LIDOCAINE-PRILOCAINE 2.5-2.5 % EX CREA: 1.0000 "application " | TOPICAL_CREAM | CUTANEOUS | Status: DC | PRN

## 2020-11-14 MED ORDER — CHLORHEXIDINE GLUCONATE CLOTH 2 % EX PADS
6.0000 | MEDICATED_PAD | Freq: Every day | CUTANEOUS | Status: DC
  Administered 2020-11-14 – 2020-11-15 (×2): 6 via TOPICAL

## 2020-11-14 NOTE — Progress Notes (Signed)
Progress Note  Patient Name: Travis [name] Date of Encounter: 11/14/2020  [facility] Cardiologist: Glenetta Hew, MD   Subjective   Complains of pain on his backside.  No chest pain or shortness of breath.  Wife at the bedside.  Inpatient Medications    Scheduled Meds: . sodium chloride   Intravenous Once  . amiodarone  200 mg Per Tube Daily  . atorvastatin  80 mg Per Tube q1800  . B-complex with vitamin C  1 tablet Per Tube Daily  . chlorhexidine gluconate (MEDLINE KIT)  15 mL Mouth Rinse BID  . Chlorhexidine Gluconate Cloth  6 each Topical Q0600  . clonazepam  1 mg Per Tube BID  . collagenase   Topical Daily  . docusate  100 mg Per Tube BID  . doxercalciferol  5 mcg Intravenous Q M,W,F  . feeding supplement (PROSource TF)  90 mL Per Tube QID  . insulin aspart  0-6 Units Subcutaneous Q4H  . lidocaine  1 patch Transdermal Q24H  . mouth rinse  15 mL Mouth Rinse 10 times per day  . midodrine  5 mg Per Tube TID WC  . pantoprazole (PROTONIX) IV  40 mg Intravenous Q12H  . polyethylene glycol  17 g Per Tube Daily  . QUEtiapine  100 mg Per Tube BID  . senna  1 tablet Per Tube QHS  . sevelamer carbonate  0.8 g Per Tube TID  . sodium chloride flush  10-40 mL Intracatheter Q12H  . sodium chloride flush  3 mL Intravenous Q12H  . ticagrelor  90 mg Per Tube BID   Continuous Infusions: . sodium chloride Stopped (11/10/20 1944)  . sodium chloride Stopped (10/28/20 0954)  . sodium chloride Stopped (11/04/20 0016)  . sodium chloride    . sodium chloride    .  ceFAZolin (ANCEF) IV Stopped (11/13/20 1442)  . dexmedetomidine (PRECEDEX) IV infusion 0.6 mcg/kg/hr (11/14/20 0600)  . feeding supplement (VITAL 1.5 CAL) 40 mL/hr at 11/14/20 0652  . norepinephrine (LEVOPHED) Adult infusion Stopped (11/13/20 0428)   PRN Meds: sodium chloride, sodium chloride, sodium chloride, acetaminophen, bisacodyl, fentaNYL (SUBLIMAZE) injection, haloperidol lactate, lidocaine (PF),  lidocaine-prilocaine, LORazepam, ondansetron (ZOFRAN) IV, pentafluoroprop-tetrafluoroeth, sodium chloride flush, sodium chloride flush   Vital Signs    Vitals:   11/14/20 0600 11/14/20 0700 11/14/20 0800 11/14/20 0819  BP: 113/66 96/61 116/67   Pulse: 82 78 87   Resp: (!) 31 (!) 27 (!) 32   Temp:    99.3 F (37.4 C)  TempSrc:    Oral  SpO2: 94% 93% 93%   Weight:      Height:        Intake/Output Summary (Last 24 hours) at 11/14/2020 0913 Last data filed at 11/14/2020 3762 Gross per 24 hour  Intake 2875.61 ml  Output 2075 ml  Net 800.61 ml   Last 3 Weights 11/14/2020 11/13/2020 11/13/2020  Weight (lbs) 183 lb 6.8 oz 179 lb 0.2 oz 183 lb 13.8 oz  Weight (kg) 83.2 kg 81.2 kg 83.4 kg      Telemetry    Normal sinus rhythm without atrial fibrillation- Personally Reviewed   Physical Exam  Somnolent but arousable to voice and interacts appropriately today, answering questions GEN: No acute distress.   Neck: No JVD Cardiac: RRR, no murmurs, rubs, or gallops.  Respiratory: Clear to auscultation bilaterally. GI: Soft, nontender, non-distended  MS: No edema; No deformity. Neuro:  Nonfocal  Psych: Normal affect  Lines: Left subclavian triple-lumen catheter, right sided temporary  dialysis catheter  Labs    High Sensitivity Troponin:   Recent Labs  Lab 10/28/20 0629 10/28/20 1046 10/28/20 2040 10/29/20 2121 10/30/20 0548  TROPONINIHS 3,155* >27,000* >27,000* >27,000* >27,000*      Chemistry Recent Labs  Lab 11/08/20 0347 11/08/20 1706 11/13/20 0024 11/13/20 1734 11/14/20 0413  NA 134*   < > 133* 134* 132*  K 5.5*   < > 4.4 3.8 3.9  CL 101   < > 100 99 98  CO2 16*   < > 20* 22 20*  GLUCOSE 89   < > 94 126* 122*  BUN 112*   < > 59* 45* 63*  CREATININE 7.43*   < > 6.50* 5.66* 6.61*  CALCIUM 9.4   < > 8.7* 8.5* 8.8*  PROT 5.8*  --   --   --   --   ALBUMIN 1.3*   < > 1.2* 1.2* 1.1*  AST 53*  --   --   --   --   ALT 5  --   --   --   --   ALKPHOS 137*  --   --    --   --   BILITOT 1.9*  --   --   --   --   GFRNONAA 8*   < > 9* 11* 9*  ANIONGAP 17*   < > _0 < > = values in this interval not displayed.     Hematology Recent Labs  Lab 11/12/20 0314 11/12/20 0314 11/13/20 0023 11/13/20 0453 11/14/20 0413  WBC 12.7*  --  12.7*  --  12.0*  RBC 3.14*  --  2.75*  --  2.60*  HGB 9.4*   < > 8.2* 8.4* 7.7*  HCT 29.0*   < > 25.5* 26.0* 24.5*  MCV 92.4  --  92.7  --  94.2  MCH 29.9  --  29.8  --  29.6  MCHC 32.4  --  32.2  --  31.4  RDW 18.3*  --  18.0*  --  17.6*  PLT 234  --  227  --  227   < > = values in this interval not displayed.    BNPNo results for input(s): BNP, PROBNP in the last 168 hours.   DDimer No results for input(s): DDIMER in the last 168 hours.   Radiology    ECHOCARDIOGRAM LIMITED  Result Date: 11/13/2020    ECHOCARDIOGRAM LIMITED REPORT   Patient Name:   GUILLERMO NEHRING Date of Exam: 11/13/2020 Medical Rec #:  [id number]         Height:       71.0 in Accession #:    [id number]        Weight:       183.9 lb Date of Birth:  1961/10/12        BSA:          2.035 m Patient Age:    30 years          BP:           112/68 mmHg Patient Gender: M                 HR:           91 bpm. Exam Location:  Inpatient Procedure: Limited Echo, Cardiac Doppler and Color Doppler Indications:    Cardiomyopathy-Ischemic 414.8 / I25.5  History:        Patient has prior history of Echocardiogram examinations,  most                 recent 10/31/2020. CHF, Previous Myocardial Infarction and CAD,                 Arrythmias:Atrial Fibrillation; Risk Factors:Hypertension and                 Dyslipidemia. CKD.  Sonographer:    Clayton Lefort RDCS (AE) Referring Phys: [place]  Sonographer Comments: Limited patient mobility. IMPRESSIONS  1. Left ventricular ejection fraction, by estimation, is 45 to 50%. The left ventricle has mildly decreased function. The left ventricle demonstrates regional wall motion abnormalities (see scoring diagram/findings  for description). There is severe concentric left ventricular hypertrophy. Left ventricular diastolic parameters are consistent with Grade I diastolic dysfunction (impaired relaxation).  2. Right ventricular systolic function is normal. The right ventricular size is normal. There is normal pulmonary artery systolic pressure. The estimated right ventricular systolic pressure is 67.6 mmHg.  3. The mitral valve is degenerative. Mild mitral valve regurgitation. No evidence of mitral stenosis.  4. The aortic valve is tricuspid. There is mild calcification of the aortic valve. There is mild thickening of the aortic valve. Aortic valve regurgitation is mild. Mild aortic valve sclerosis is present, with no evidence of aortic valve stenosis.  5. The inferior vena cava is normal in size with greater than 50% respiratory variability, suggesting right atrial pressure of 3 mmHg. Comparison(s): Changes from prior study are noted. EF has improved to 45-50%. WMAs remain the mid septum into apex. RVSP has improved. FINDINGS  Left Ventricle: Left ventricular ejection fraction, by estimation, is 45 to 50%. The left ventricle has mildly decreased function. The left ventricle demonstrates regional wall motion abnormalities. The left ventricular internal cavity size was normal in size. There is severe concentric left ventricular hypertrophy. Left ventricular diastolic parameters are consistent with Grade I diastolic dysfunction (impaired relaxation).  LV Wall Scoring: The mid and distal anterior septum, mid inferoseptal segment, and apex are hypokinetic. Right Ventricle: The right ventricular size is normal. No increase in right ventricular wall thickness. Right ventricular systolic function is normal. There is normal pulmonary artery systolic pressure. The tricuspid regurgitant velocity is 1.53 m/s, and  with an assumed right atrial pressure of 3 mmHg, the estimated right ventricular systolic pressure is 72.0 mmHg. Pericardium: Trivial  pericardial effusion is present. Presence of pericardial fat pad. Mitral Valve: The mitral valve is degenerative in appearance. Mild mitral annular calcification. Mild mitral valve regurgitation. No evidence of mitral valve stenosis. Tricuspid Valve: The tricuspid valve is grossly normal. Tricuspid valve regurgitation is trivial. No evidence of tricuspid stenosis. Aortic Valve: The aortic valve is tricuspid. There is mild calcification of the aortic valve. There is mild thickening of the aortic valve. Aortic valve regurgitation is mild. Mild aortic valve sclerosis is present, with no evidence of aortic valve stenosis. Pulmonic Valve: The pulmonic valve was grossly normal. Pulmonic valve regurgitation is not visualized. No evidence of pulmonic stenosis. Venous: The inferior vena cava is normal in size with greater than 50% respiratory variability, suggesting right atrial pressure of 3 mmHg. LEFT VENTRICLE PLAX 2D LVIDd:         4.80 cm      Diastology LVIDs:         3.30 cm      LV e' medial:    5.22 cm/s LV PW:         1.81 cm      LV  E/e' medial:  15.8 LV IVS:        1.78 cm      LV e' lateral:   7.72 cm/s LVOT diam:     2.30 cm      LV E/e' lateral: 10.7 LVOT Area:     4.15 cm  LV Volumes (MOD) LV vol d, MOD A2C: 193.0 ml LV vol d, MOD A4C: 157.0 ml LV vol s, MOD A2C: 109.0 ml LV vol s, MOD A4C: 75.9 ml LV SV MOD A2C:     84.0 ml LV SV MOD A4C:     157.0 ml LV SV MOD BP:      88.0 ml IVC IVC diam: 1.40 cm LEFT ATRIUM         Index LA diam:    4.20 cm 2.06 cm/m   AORTA Ao Root diam: 4.00 cm MITRAL VALVE                TRICUSPID VALVE MV Area (PHT): 4.10 cm     TR Peak grad:   9.4 mmHg MV Decel Time: 185 msec     TR Vmax:        153.00 cm/s MR Peak grad: 75.0 mmHg MR Mean grad: 50.0 mmHg     SHUNTS MR Vmax:      433.00 cm/s   Systemic Diam: 2.30 cm MR Vmean:     332.0 cm/s MV E velocity: 82.50 cm/s MV A velocity: 135.00 cm/s MV E/A ratio:  0.61 Eleonore Chiquito MD Electronically signed by Eleonore Chiquito MD Signature  Date/Time: 11/13/2020/6:05:32 PM    Final     Cardiac Studies   See above  Patient Profile     59 y.o. male with ESRD on HD, HTN, tobacco abuse, CAD  presenting with anterior STEMI 10/28/20 s/p placement of a stent in the LAD with residual disease in the RCA and Circumflex. He developed atrial fib with RVR. Hospital course complicated by MSSA bacteremia with shock, upper GI bleeding secondary to severe esophagitis and duodenal ulcer, ICU delirium, metabolic encephalopathy, acute hypoxemic respiratory failure, anemia.   Assessment & Plan    1.  Coronary artery disease with anterior STEMI: Patient presented November 20 now with prolonged hospitalization.  He has a core track in place and will switch him from IV cangrelor to Brilinta.  He will be loaded with 180 mg followed by 90 mg twice daily.  Discussed plan with RN, pharmacist, and patient's wife at the bedside. 2.  Shock, multifactorial: Remains on norepinephrine drip at a dose of 10 mcg.  Midodrine to be started today.  Now on intermittent hemodialysis. 3.  End-stage renal disease: Per nephrology team, off of CVVHD and onto intermittent dialysis 4.  Atrial fibrillation: No atrial fib noted in several days.  Has been on amiodarone drip.  Convert to oral amiodarone and discontinue drip.  He will not be a candidate for anticoagulation because of upper GI bleeding with esophagitis and duodenal ulcer.  He requires antiplatelet therapy for STEMI/PCI.  Plans for today: Convert Cangrelor to oral therapy with ticagrelor, transition from IV to oral amiodarone.  Reviewed cath films, he will ultimately need staged PCI of the right coronary artery but there is no acute need to do this.  We will also review films with Dr. Angelena Form, but I would probably be inclined to treat his circumflex medically as the lesion leads and his small posterior lateral branches and would involve stenting across a large obtuse marginal branch.  His right coronary  artery could be  staged during this hospitalization or could even be done electively after he recovers.  Other management per CCM team.  Appreciate all of their care.  For questions or updates, please contact [place] Please consult [email or web address] for contact info under        Signed, Sherren Mocha, MD  11/14/2020, 9:13 AM

## 2020-11-14 NOTE — Progress Notes (Signed)
Modified Barium Swallow Progress Note  Patient Details  Name: Travis [name] MRN: [id number] Date of Birth: [birth year]  Today's Date: 11/14/2020  Modified Barium Swallow completed.  Full report located under Chart Review in the Imaging Section.  Brief recommendations include the following:  Clinical Impression  Patient presents with a moderate oropharyngeal dysphagia with likely impact from current cognitive status and pain leading to difficulty maintaining adequate posture and participation during MBS. Patient kept head in a downward, almost chin tuck posture and was not able to raise head despite tactile and physical cues. Oral delay in transit led to premature spillage into vallecular sinus, and swallow initiation occuring at level of vallecular sinus. With puree bolus, patient exhibited more significant delay in swallow initiation and had mild-mod vallecular residuals post initial swallow. No penetration or aspiration observed and no significant amount of pyriform sinus residuals was observed. UES opening appeared [facility] and without evidence of retrograde movement of boluses. Currently, patient is not safe for full PO's, but this is largely due to his inability to maintain adequate participation combined with his overall weakness. Recommendation is remain NPO but allow for SLP to observe patient with trials of thin liquids, nectar thick liquids, puree solids at bedside.   Swallow Evaluation Recommendations       SLP Diet Recommendations: NPO;Other (Comment) (PO trials of puree solids, thin liquids, nectar thick liquids with SLP)       Medication Administration: Via alternative means               Oral Care Recommendations: Oral care QID       Sonia Baller, MA, CCC-SLP Speech Therapy [facility]

## 2020-11-14 NOTE — Plan of Care (Signed)
  Problem: Cardiovascular: Goal: Ability to achieve and maintain adequate cardiovascular perfusion will improve 11/14/2020 0117 by Patton Salles, RN Outcome: Progressing 11/14/2020 0114 by Patton Salles, RN Outcome: Progressing   Problem: Clinical Measurements: Goal: Ability to maintain clinical measurements within normal limits will improve 11/14/2020 0117 by Patton Salles, RN Outcome: Progressing 11/14/2020 0114 by Patton Salles, RN Outcome: Progressing Goal: Will remain free from infection 11/14/2020 0117 by Patton Salles, RN Outcome: Progressing 11/14/2020 0114 by Patton Salles, RN Outcome: Progressing Goal: Diagnostic test results will improve 11/14/2020 0117 by Patton Salles, RN Outcome: Progressing 11/14/2020 0114 by Patton Salles, RN Outcome: Progressing Goal: Respiratory complications will improve 11/14/2020 0117 by Patton Salles, RN Outcome: Progressing 11/14/2020 0114 by Patton Salles, RN Outcome: Progressing Goal: Cardiovascular complication will be avoided 11/14/2020 0117 by Patton Salles, RN Outcome: Progressing 11/14/2020 0114 by Patton Salles, RN Outcome: Progressing   Problem: Elimination: Goal: Will not experience complications related to bowel motility Outcome: Progressing   Problem: Pain Managment: Goal: General experience of comfort will improve Outcome: Progressing   Problem: Safety: Goal: Ability to remain free from injury will improve Outcome: Progressing   Problem: Activity: Goal: Ability to tolerate increased activity will improve Outcome: Progressing   Problem: Respiratory: Goal: Ability to maintain a clear airway and adequate ventilation will improve Outcome: Progressing   Problem: Role Relationship: Goal: Method of communication will improve Outcome: Progressing   Problem: Education: Goal: Understanding of CV disease, CV risk reduction, and recovery process will improve 11/14/2020 0117 by Patton Salles, RN Outcome: Not  Progressing 11/14/2020 0114 by Patton Salles, RN Outcome: Not Progressing   Problem: Activity: Goal: Ability to return to baseline activity level will improve 11/14/2020 0117 by Patton Salles, RN Outcome: Not Progressing 11/14/2020 0114 by Patton Salles, RN Outcome: Not Progressing   Problem: Health Behavior/Discharge Planning: Goal: Ability to safely manage health-related needs after discharge will improve 11/14/2020 0117 by Patton Salles, RN Outcome: Not Progressing 11/14/2020 0114 by Patton Salles, RN Outcome: Progressing   Problem: Education: Goal: Knowledge of General Education information will improve Description: Including pain rating scale, medication(s)/side effects and non-pharmacologic comfort measures 11/14/2020 0117 by Patton Salles, RN Outcome: Not Progressing 11/14/2020 0114 by Patton Salles, RN Outcome: Not Progressing   Problem: Health Behavior/Discharge Planning: Goal: Ability to manage health-related needs will improve 11/14/2020 0117 by Patton Salles, RN Outcome: Not Progressing 11/14/2020 0114 by Patton Salles, RN Outcome: Not Progressing   Problem: Activity: Goal: Risk for activity intolerance will decrease 11/14/2020 0117 by Patton Salles, RN Outcome: Not Progressing 11/14/2020 0114 by Patton Salles, RN Outcome: Not Progressing   Problem: Nutrition: Goal: Adequate nutrition will be maintained 11/14/2020 0117 by Patton Salles, RN Outcome: Not Progressing 11/14/2020 0114 by Patton Salles, RN Outcome: Progressing   Problem: Coping: Goal: Level of anxiety will decrease Outcome: Not Progressing   Problem: Skin Integrity: Goal: Risk for impaired skin integrity will decrease Outcome: Not Progressing

## 2020-11-14 NOTE — Progress Notes (Signed)
Physical Therapy Wound Treatment Patient Details  Name: ESIAH BAZINET MRN: [id number] Date of Birth: November 13, 1961  Today's Date: 11/14/2020 Time: 1100-1150 Time Calculation (min): 50 min  Subjective  Subjective: Agreeable to hydrotherapy Patient and Family Stated Goals: None stated Prior Treatments: Dressing changes  Pain Score:  Premedicated and tolerated well.   Wound Assessment  Pressure Injury 11/14/20 Buttocks Mid;Right;Left Unstageable - Full thickness tissue loss in which the base of the injury is covered by slough (yellow, tan, gray, green or brown) and/or eschar (tan, brown or black) in the wound bed. (Active)  Wound Image   11/14/20 1100  Dressing Type ABD;Barrier Film (skin prep);Gauze (Comment);Moist to dry 11/14/20 1100  Dressing Changed;Clean;Dry;Intact 11/14/20 1100  Dressing Change Frequency Daily 11/14/20 1100  State of Healing Eschar 11/14/20 1100  Site / Wound Assessment Red;Black;Brown 11/14/20 1100  % Wound base Red or Granulating 50% 11/14/20 1100  % Wound base Yellow/Fibrinous Exudate 0% 11/14/20 1100  % Wound base Black/Eschar 50% 11/14/20 1100  % Wound base Other/Granulation Tissue (Comment) 0% 11/14/20 1100  Peri-wound Assessment Intact 11/14/20 1100  Wound Length (cm) 11 cm 11/14/20 1100  Wound Width (cm) 17 cm 11/14/20 1100  Wound Depth (cm) 0.1 cm 11/14/20 1100  Wound Surface Area (cm^2) 187 cm^2 11/14/20 1100  Wound Volume (cm^3) 18.7 cm^3 11/14/20 1100  Margins Unattached edges (unapproximated) 11/14/20 1100  Drainage Amount Minimal 11/14/20 1100  Drainage Description Serosanguineous 11/14/20 1100  Treatment Debridement (Selective);Hydrotherapy (Pulse lavage);Packing (Saline gauze) 11/14/20 1100   Santyl applied to wound bed prior to applying dressing.     Hydrotherapy Pulsed lavage therapy - wound location: Buttocks Pulsed Lavage with Suction (psi): 12 psi Pulsed Lavage with Suction - Normal Saline Used: 1000 mL Pulsed Lavage Tip: Tip with  splash shield Selective Debridement Selective Debridement - Location: Buttocks Selective Debridement - Tools Used: Forceps;Scalpel Selective Debridement - Tissue Removed: Eschar   Wound Assessment and Plan  Wound Therapy - Assess/Plan/Recommendations Wound Therapy - Clinical Statement: Pt presents to hydrotherapy with an unstagable pressure injury to the buttocks. This patient will benefit from continued hydrotherapy for selective removal of unviable tissue, to decrease bioburden and promote wound bed healing.  Wound Therapy - Functional Problem List: Global weakness s/p ICU stay Factors Delaying/Impairing Wound Healing: Immobility;Multiple medical problems Hydrotherapy Plan: Debridement;Dressing change;Patient/family education;Pulsatile lavage with suction Wound Therapy - Frequency: 6X / week Wound Therapy - Follow Up Recommendations: Skilled nursing facility Wound Plan: See above  Wound Therapy Goals- Improve the function of patient's integumentary system by progressing the wound(s) through the phases of wound healing (inflammation - proliferation - remodeling) by: Decrease Necrotic Tissue to: 20% Decrease Necrotic Tissue - Progress: Goal set today Increase Granulation Tissue to: 80% Increase Granulation Tissue - Progress: Goal set today Goals/treatment plan/discharge plan were made with and agreed upon by patient/family: Yes Time For Goal Achievement: 7 days Wound Therapy - Potential for Goals: Good  Goals will be updated until maximal potential achieved or discharge criteria met.  Discharge criteria: when goals achieved, discharge from hospital, MD decision/surgical intervention, no progress towards goals, refusal/missing three consecutive treatments without notification or medical reason.  GP     Thelma Comp 11/14/2020, 2:52 PM   Rolinda Roan, PT, DPT Acute Rehabilitation Services Pager: [phone] Office: [phone]

## 2020-11-14 NOTE — Evaluation (Signed)
Occupational Therapy Evaluation Patient Details Name: Travis [name] MRN: [id number] DOB: 03/17/1961 Today's Date: 11/14/2020    History of Present Illness 59 y.o. male with past medical history of end-stage renal disease on hemodialysis, essential hypertension, dilated ascending aorta and hyperlipidemia who presented on November 20 with anterior ST elevation myocardial infarction.  VDRF 11/23-12/3.  Stent placed 11/20.  Afib with RVR with cardioversion 11/21. Suspected cholecystolithiasis 11/29.     Clinical Impression   PTA, pt was living with his wife and was independent with ADLs. Pt currently requiring Max-Total A for ADLs and bed mobility. Pt with decreased strength, sitting balance, cognition, and activity tolerance. Pt participating in BUE/BLE ROM in supine and neck/trunk exercises while sitting at EOB. VSS throughout. Limited by pain with wound at buttock area. Pt would benefit from further acute OT to facilitate safe dc. Recommend dc to CIR for further OT to optimize safety, independence with ADLs, and return to PLOF as well as decrease caregiver burden.     Follow Up Recommendations  CIR;Supervision/Assistance - 24 hour    Equipment Recommendations  3 in 1 bedside commode;Wheelchair (measurements OT);Wheelchair cushion (measurements OT)    Recommendations for Other Services PT consult     Precautions / Restrictions Precautions Precautions: Fall Restrictions Weight Bearing Restrictions: No      Mobility Bed Mobility Overal bed mobility: Needs Assistance Bed Mobility: Supine to Sit     Supine to sit: Total assist;+2 for physical assistance     General bed mobility comments: Pt required total assist of 2 to come to EOB needing assist with LEs and elevation of trunk.     Transfers                 General transfer comment: unable    Balance Overall balance assessment: Needs assistance Sitting-balance support: No upper extremity supported;Feet  supported;Bilateral upper extremity supported Sitting balance-Leahy Scale: Zero Sitting balance - Comments: Pt needing total to max assist for sitting EOB for up to 10 minutes.  Pt with flexed neck and trunk with pt having difficulty even extending neck with PT lifiting neck for pt to look up and immediately flexes back down to full flexion as he cannot hold the neck up. Pt also does not use the UEs to hold himself up as he does not have postural stability in trunk.  Attempts to kick LEs were not successful as he has trace movement against gravity.  Postural control: Posterior lean;Right lateral lean;Left lateral lean (Anterior lean)                                 ADL either performed or assessed with clinical judgement   ADL Overall ADL's : Needs assistance/impaired Eating/Feeding: NPO   Grooming: Maximal assistance;Bed level Grooming Details (indicate cue type and reason): Max hand over hand Upper Body Bathing: Maximal assistance;Bed level;Sitting   Lower Body Bathing: Total assistance;Bed level   Upper Body Dressing : Maximal assistance;Sitting;Bed level   Lower Body Dressing: Total assistance;Bed level                 General ADL Comments: Pt with decreased strength, sitting balance, cogntiion, and activity tolerance. Pt tolerating sitting at EOB for ~5 minutes. Limited by pain and fatigue     Vision         Perception     Praxis      Pertinent Vitals/Pain Pain Assessment: Faces Faces Pain Scale:  Hurts even more Pain Location: bottom and peri area Pain Descriptors / Indicators: Discomfort;Grimacing;Guarding Pain Intervention(s): Monitored during session;Limited activity within patient's tolerance;Repositioned     Hand Dominance     Extremity/Trunk Assessment Upper Extremity Assessment Upper Extremity Assessment: RUE deficits/detail;LUE deficits/detail RUE Deficits / Details: weakness btoh gross and grasp. Performing AAROM for shoulder flexion and  elbow flexion/extenion. Able to perform composite flexion/exention.  RUE Coordination: decreased fine motor;decreased gross motor LUE Deficits / Details: L with more strength than R. weakness btoh gross and grasp. Performing AAROM for shoulder flexion and elbow flexion/extenion. Able to perform composite flexion/exention.  LUE Coordination: decreased fine motor;decreased gross motor   Lower Extremity Assessment Lower Extremity Assessment: Defer to PT evaluation RLE Deficits / Details: grossly 2-/5 LLE Deficits / Details: grossly 2-/5   Cervical / Trunk Assessment Cervical / Trunk Assessment: Kyphotic;Other exceptions (neck flexed and trunk flexed with little use of UEs)   Communication Communication Communication: No difficulties   Cognition Arousal/Alertness: Lethargic Behavior During Therapy: Flat affect Overall Cognitive Status: Impaired/Different from baseline Area of Impairment: Memory;Following commands;Safety/judgement;Awareness;Problem solving;Orientation;Attention                 Orientation Level: Disoriented to;Time;Situation;Place Current Attention Level: Focused;Sustained Memory: Decreased short-term memory Following Commands: Follows one step commands inconsistently;Follows one step commands with increased time Safety/Judgement: Decreased awareness of safety;Decreased awareness of deficits Awareness: Intellectual Problem Solving: Difficulty sequencing;Requires verbal cues;Requires tactile cues;Decreased initiation;Slow processing General Comments: Responding to commands with increased time. Making eye contact. Sustaining attention to simple actions. More engaged and attempting to perform neck ROM. Limited by pain at bottom impacting performance.   General Comments  VSS    Exercises Exercises: General Lower Extremity;General Upper Extremity;Other exercises General Exercises - Upper Extremity Shoulder Flexion: AAROM;Both;5 reps;Supine Elbow Flexion:  AAROM;Both;Supine;5 reps Elbow Extension: AAROM;Both;5 reps;Supine Digit Composite Flexion: AROM;Both;5 reps;Supine Composite Extension: AROM;Both;5 reps;Supine General Exercises - Lower Extremity Ankle Circles/Pumps: AAROM;Both;5 reps;Supine Quad Sets: AROM;Both;10 reps;Supine Long Arc Quad: AAROM;Both;5 reps;Seated Heel Slides: AAROM;Both;10 reps;Supine Straight Leg Raises: AAROM;Both;10 reps;Supine Hip Flexion/Marching: AAROM;Both;10 reps;Supine Other Exercises Other Exercises: wife educated to perform UE and LE exercises 3-5 x day for upt to 10 reps. Wife given handout for UE and LE ROM Other Exercises: Scapular retraction in sitting with Max A; x5 Other Exercises: neck flexion in sitting; x5   Shoulder Instructions      Home Living Family/patient expects to be discharged to:: Private residence Living Arrangements: Spouse/significant other Available Help at Discharge: Family;Available 24 hours/day Type of Home: House Home Access: Stairs to enter [employer] of Steps: 1 Entrance Stairs-Rails: None Home Layout: One level     Bathroom Shower/Tub: Teacher, early years/pre: Handicapped height     Home Equipment: None          Prior Functioning/Environment Level of Independence: Needs assistance  Gait / Transfers Assistance Needed: Per wife, pt walked PTA without device ADL's / Homemaking Assistance Needed: Independent with ADLs per wife            OT Problem List: Decreased strength;Decreased range of motion;Decreased activity tolerance;Impaired balance (sitting and/or standing);Decreased cognition;Decreased safety awareness;Decreased knowledge of use of DME or AE;Decreased knowledge of precautions;Cardiopulmonary status limiting activity;Pain;Impaired UE functional use      OT Treatment/Interventions: Self-care/ADL training;Therapeutic exercise;Energy conservation;DME and/or AE instruction;Therapeutic activities;Patient/family education    OT  Goals(Current goals can be found in the care plan section) Acute Rehab OT Goals Patient Stated Goal: to go home OT Goal Formulation: With patient/family Time  For Goal Achievement: 11/28/20 Potential to Achieve Goals: Good  OT Frequency: Min 2X/week   Barriers to D/C:            Co-evaluation PT/OT/SLP Co-Evaluation/Treatment: Yes Reason for Co-Treatment: For patient/therapist safety;To address functional/ADL transfers PT goals addressed during session: Mobility/safety with mobility OT goals addressed during session: ADL's and self-care      AM-PAC OT "6 Clicks" Daily Activity     Outcome Measure Help from another person eating meals?: Total Help from another person taking care of personal grooming?: A Lot Help from another person toileting, which includes using toliet, bedpan, or urinal?: Total Help from another person bathing (including washing, rinsing, drying)?: A Lot Help from another person to put on and taking off regular upper body clothing?: A Lot Help from another person to put on and taking off regular lower body clothing?: Total 6 Click Score: 9   End of Session Nurse Communication: Mobility status  Activity Tolerance: Patient limited by fatigue Patient left: in bed;with bed alarm set;with call bell/phone within reach;with nursing/sitter in room;with restraints reapplied  OT Visit Diagnosis: Unsteadiness on feet (R26.81);Other abnormalities of gait and mobility (R26.89);Muscle weakness (generalized) (M62.81);Pain Pain - part of body:  (Bottom)                Time: 8208-1388 OT Time Calculation (min): 26 min Charges:  OT General Charges $OT Visit: 1 Visit OT Evaluation $OT Eval High Complexity: 1 High  Arthi Mcdonald MSOT, OTR/L Acute Rehab Pager: [phone] Office: [place] 11/14/2020, 1:01 PM

## 2020-11-14 NOTE — Progress Notes (Signed)
Physical Therapy Treatment Patient Details Name: Travis [name] MRN: [id number] DOB: Apr 30, 1961 Today's Date: 11/14/2020    History of Present Illness 59 y.o. male with past medical history of end-stage renal disease on hemodialysis, essential hypertension, dilated ascending aorta and hyperlipidemia who presented on November 20 with anterior ST elevation myocardial infarction.  VDRF 11/23-12/3.  Stent placed 11/20.  Afib with RVR with cardioversion 11/21. Suspected cholecystolithiasis 11/29.      PT Comments    Pt admitted with above diagnosis. Pt was able to sit EOB for 15 min with total to min assist, min assist only for 2-3 seconds as pt with continued poor postural stability.  Pt responding to commands a little more consistently today.  Will continue to progress pt as able.  Pt currently with functional limitations due to balance and endurance deficits. Pt will benefit from skilled PT to increase their independence and safety with mobility to allow discharge to the venue listed below.     Follow Up Recommendations  CIR;Supervision/Assistance - 24 hour     Equipment Recommendations  Wheelchair (measurements PT);Wheelchair cushion (measurements PT)    Recommendations for Other Services       Precautions / Restrictions Precautions Precautions: Fall    Mobility  Bed Mobility Overal bed mobility: Needs Assistance Bed Mobility: Supine to Sit     Supine to sit: Total assist;+2 for physical assistance     General bed mobility comments: Pt required total assist of 2 to come to EOB needing assist with LEs and elevation of trunk.   Transfers                 General transfer comment: unable  Ambulation/Gait             General Gait Details: unable   Stairs             Wheelchair Mobility    Modified Rankin (Stroke Patients Only)       Balance Overall balance assessment: Needs assistance Sitting-balance support: No upper extremity supported;Feet  supported;Bilateral upper extremity supported Sitting balance-Leahy Scale: Zero Sitting balance - Comments: Pt needing total to min assist for sitting EOB for up to 15 minutes.  Pt with flexed neck and trunk initially but was able to extend neck and lift head on his own to look at wife a few times.  Pt also does not use the UEs to hold himself up.  Pt was demonstrating a little postural stability in trunk today but could not sustain posture on his own longer than 2-3 seconds before losing balance all directins with poor righting reactions.   Postural control: Posterior lean;Right lateral lean;Left lateral lean (Anterior lean)                                  Cognition Arousal/Alertness: Awake/alert Behavior During Therapy: Flat affect Overall Cognitive Status: Impaired/Different from baseline Area of Impairment: Orientation;Memory;Following commands;Safety/judgement;Awareness;Problem solving                 Orientation Level: Disoriented to;Time;Situation;Place   Memory: Decreased short-term memory Following Commands: Follows one step commands inconsistently;Follows one step commands with increased time Safety/Judgement: Decreased awareness of safety;Decreased awareness of deficits   Problem Solving: Difficulty sequencing;Requires verbal cues;Requires tactile cues;Decreased initiation;Slow processing General Comments: Pt responds to commands more consistently today. did try to extend neck to command and was able to look up better than last treatment.  Pt did not  do anything inappropriate during session.       Exercises General Exercises - Upper Extremity Shoulder Flexion: AAROM;Both;5 reps;Supine Elbow Flexion: AAROM;Both;Supine;5 reps Elbow Extension: AAROM;Both;5 reps;Supine General Exercises - Lower Extremity Ankle Circles/Pumps: AAROM;Both;Supine;10 reps Quad Sets: AROM;Both;10 reps;Supine Heel Slides: AAROM;Both;10 reps;Supine Straight Leg Raises:  AAROM;Both;10 reps;Supine Other Exercises Other Exercises: wife educated to perform UE and LE exercises 3-5 x day for upt to 10 reps. Wife given handout for UE and LE ROM    General Comments General comments (skin integrity, edema, etc.): VSS      Pertinent Vitals/Pain Pain Assessment: Faces Faces Pain Scale: Hurts even more Pain Location: generalized Pain Descriptors / Indicators: Discomfort;Grimacing;Guarding Pain Intervention(s): Limited activity within patient's tolerance;Monitored during session;Repositioned    Home Living                      Prior Function            PT Goals (current goals can now be found in the care plan section) Acute Rehab PT Goals Patient Stated Goal: to go home Progress towards PT goals: Progressing toward goals    Frequency    Min 3X/week      PT Plan Current plan remains appropriate    Co-evaluation PT/OT/SLP Co-Evaluation/Treatment: Yes Reason for Co-Treatment: Complexity of the patient's impairments (multi-system involvement);For patient/therapist safety PT goals addressed during session: Mobility/safety with mobility        AM-PAC PT "6 Clicks" Mobility   Outcome Measure  Help needed turning from your back to your side while in a flat bed without using bedrails?: Total Help needed moving from lying on your back to sitting on the side of a flat bed without using bedrails?: Total Help needed moving to and from a bed to a chair (including a wheelchair)?: Total Help needed standing up from a chair using your arms (e.g., wheelchair or bedside chair)?: Total Help needed to walk in hospital room?: Total Help needed climbing 3-5 steps with a railing? : Total 6 Click Score: 6    End of Session Equipment Utilized During Treatment: Oxygen Activity Tolerance: Patient limited by fatigue Patient left: in bed;with call bell/phone within reach;with nursing/sitter in room;with SCD's reapplied;with family/visitor present;with  restraints reapplied Nurse Communication: Mobility status;Need for lift equipment (Did not get OOB due to wound, hydro coming at 11am) PT Visit Diagnosis: Muscle weakness (generalized) (M62.81)     Time: 7414-2395 PT Time Calculation (min) (ACUTE ONLY): 36 min  Charges:  $Therapeutic Activity: 8-22 mins                     Katana Berthold W,PT Acute Rehabilitation Services Pager:  [phone]  Office:  [place] 11/14/2020, 11:57 AM

## 2020-11-14 NOTE — Progress Notes (Signed)
NAME:  Travis [name], MRN:  [id number], DOB:  February 25, 1961, LOS: 12 ADMISSION DATE:  10/28/2020, CONSULTATION DATE:  10/29/20 REFERRING MD:  Cardiology - [name], CHIEF COMPLAINT:  Hypotension, gram positive cocci on culture, AMS  Brief History   Travis [name] is a 59 year old man with a history of HTN, ESRD on HD MWF, RUE AV fistula, hx of smoking, admitted 10/28/20 with fevers, myalgias for several days, acute chest pain.    History of present illness   On Admission, found to have STEMI, multivessel disease. Pulm edema with mild hypoxemia.  S/Post PCI to the LAD on 10/29/19. Planning for staged intervention to the left circumflex and possibly right coronary artery.  EF 63-14%, diastolic dysfunction, LVEDP 35. Underwent HD on 11/20 with 4 L volume removed.  Saturation improved after dialysis (100% on RA) Fevers noted 11/20. Blood cultures grew MSSA, noted this evening. .    This evening developed Afib with RVR 180s. Adenosine given, without improvement  Amiodarone infusion started per cardiology, 150mg  bolus, then drip.  Lopressor 5mg IV and 500ccNS given. Started on Phenylephrine, was on 2108mcg on my arrival.   Cardioverted emergently at 120J for ongoing afib with hypotension (60/40) and AMS.  Converted to sinus.  Remained moderately hypotensive (MAP 60) on phenylephrine.   Patient was given one dose zosyn, switched to ancef once cultures grew back MSSA.   Cardiac stress tests annually, last done 11/16/19.    Past Medical History  HTN ESRD, HD MWF  Significant Hospital Events   Cardioversion 11/21 Cardiac cath stent to LAD 11/20  Consults:  PCCM  Nephrology ID  Procedures:  Central line 11/21 >> 11/29 Arterial line 11/21 Repeat DCCV 11/23 11/23 ETT HD line 11/24 Arterial line 11/24 L [place] CVC 11/30>>   Significant Diagnostic Tests:  11/24 TEE>>Left ventricular ejection fraction, by estimation, is 35 to 40%. The  left ventricle has moderately decreased function.Moderate to  severe mitral regurgitation. 11/23 CT Chest>>Extensive multifocal nodular and patchy airspace disease in both lungs with a slight peripheral predominance in the upper lungs. 3.1 cm nodular consolidative opacity in the right upper lobe is cavitated. Imaging features likely related to multifocal pneumonia. Septic emboli and metastatic disease considered less likely but not excluded. 11/22 LUE Vas Upper Extremity Doppler>> Arteriovenous fistula-Aneurysmal dilatation noted. 11/24 CT abdomen - no retroperitoneal hematoma 11/29 MR Brain>> No evidence acute intracranial abnormality, sinisitis, mastoid effusions 11/29>> MR Cervical Spine>> Given the provided history of bacteremia, facet joint septic arthritis is difficult to definitively exclude, but the lack of any surrounding marrow edema argues against this Cervical spondylosis  11/29 MR Thoracic Spine No significant marrow edema in thoracic spine , Mild thoracic spondylosis hypointense marrow signal throughout the thoracic spine, likely related to the patient's end-stage renal disease. multifocal airspace disease and cavitary pulmonary lesions. Small right pleural effusion. 11/29 MR Lumbar Spine:As noted above and Suspected cholecystolithiasis  Micro Data:  11/21 BCx2>>Staph aureus > MSSA by BCID 11/24 BCx2>> 11/30 BC x 2>> staph epi>>  Antimicrobials:  Zosyn 11/21 x 1 Ancef 11/21 ->  Interim history/subjective:  Finally has cortrak. For MBSS today. Had HD yesterday but now on pressors. Still with pain on backside. Slept well.  Objective   Blood pressure 116/67, pulse 87, temperature 98.7 F (37.1 C), temperature source Axillary, resp. rate (!) 32, height 5\' 11"  (1.803 m), weight 83.2 kg, SpO2 93 %.        Intake/Output Summary (Last 24 hours) at 11/14/2020 0806 Last data filed at 11/14/2020  0652 Gross per 24 hour  Intake 2875.61 ml  Output 2075 ml  Net 800.61 ml   Filed Weights   11/13/20 1145 11/13/20 1528 11/14/20 0500   Weight: 83.4 kg 81.2 kg 83.2 kg   Constitutional: no acute distress  Eyes: EOMI, pupils equal Ears, nose, mouth, and throat: cortrak in place, MM dry, trachea midline Cardiovascular: RRR, ext warm Respiratory: Scattered crackles bases, no wheezing, no accessory muscle use Gastrointestinal: Soft, +BS Skin: No rashes, normal turgor Neurologic: moves all 4 ext to command Psychiatric: AO to self and place but not situation  H/H 7.7 from 8.2 WBC stable Glucoses improved with TF  Resolved Hospital Problem list     Assessment & Plan:   Anterior STEMI, CAD, acute CHF exacerbation- will need definitive PCI once recovers Afib s/p amio now sinus rhythm- H/H drop with heparin challege 12/5 MSSA bacteremia, source thought HD site on cefazolin ESRD on HD UGIB- secondary to severe esophagitis and duodenal ulcer question ischemic s/p EGD on 11/09/20, still a slow drop but hopefully easing up Constipation- resolved Acute toxic metabolic encephalopathy, ICU delirium- still an issue, lack of PO access was a barrier until now Acute hypoxemic respiratory failure- improved, needs mobility Pressure ulcer- not POA 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 plavix - Switch to amio to PO - Monitor H/H, continue PPI - Local wound care, PT/OT needs mobility if pressure ulcer to heal - iHD per nephrology  Best practice:  Diet: pending MBSS, TF in interim Pain/Anxiety/Delirium protocol (if indicated): see above VAP protocol (if indicated): off DVT prophylaxis: SCDs + antiplatelet agent GI prophylaxis: protonix Glucose control: SSI for now Mobility: Bedrest Code Status: Full  Family Communication: wife at bedside daily updated Disposition: ICU pending liberation from pressors and precedex   Patient critically ill  due to acute metabolic encephalopathy, shock Interventions to address this today weaning precedex, levophed Risk of deterioration without these interventions is high  I personally spent 38 minutes providing critical care not including any separately billable procedures  Erskine Emery MD [facility] 11/14/2020 8:06 AM Personal pager: [phone] If unanswered, please page CCM On-call: [phone]

## 2020-11-14 NOTE — Progress Notes (Signed)
[facility] NEPHROLOGY PROGRESS NOTE  Assessment/ Plan: Pt is a 59 y.o. yo male HTN, ESRD on HD admitted on 11/20 with fever, myalgia found to have NSTEMI, complicated by A. fib with RVR, bacteremia, GI bleed and VDRF.  OP HD:AF MWF 4h 450/800 81.5kg 2/2.25 bath RUE AVF Hep 5000+ 1000 mid-run - hect 5 ug tiw  - mircera 50 ugIVq 4 weeks, last 11/15 (due 11/29)  # ESRD - usual HD MWF.  Required CRRT on and off from 11/24-12/4.  He had intermittent hemodialysis yesterday, required some Levophed for intradialytic hypotension however tolerated with around 2 L of UF.  Plan to wean off pressors today, starting midodrine.  Next IHD tomorrow.  Discussed with ICU team and patient's wife.  # Acute STEMI- S/P LAD PCI11/20, has otherdisease inLCx/ RCA.Transitioned to brilinta from cangrelor. Per cardiology.  #MSSA cavitary PNA / bacteremia-intubated 11/23. Per PCCM. Respiratory culture 11/23 with MSSA. Blood cx +11/21.CT abd/ pelvis negative 11/23.F/U bcx's negative.Dialysis duplex 11/22 without abscessandUS of AVF wasnegative 11/27. Plan for 6 weeks of IV cefazolin per pharmacy ending 12/31/2019 .  #PEA arrest: occurred in setting of agitation/ sedation.Brief CPR.  # Shock - resolved  #Anemia ckd/ABLA - S/P PRBCs. HGB 8s  #Secondary hyperparathyroidism: Phosphorus started going off after discontinuing CRRT.  Start [place].  #Hyponatremia now managed with dialysis.  #Afib:on amio and hep gtt, s/p DCCV.   #Severe MR:per cardiology.  Subjective: Seen and examined ICU.  Still on high flow oxygen, tolerated HD yesterday with around 2 L of UF.  No new event. Objective Vital signs in last 24 hours: Vitals:   11/14/20 0600 11/14/20 0700 11/14/20 0800 11/14/20 0819  BP: 113/66 96/61 116/67   Pulse: 82 78 87   Resp: (!) 31 (!) 27 (!) 32   Temp:    99.3 F (37.4 C)  TempSrc:    Oral  SpO2: 94% 93% 93%   Weight:      Height:       Weight  change: 0 kg  Intake/Output Summary (Last 24 hours) at 11/14/2020 0939 Last data filed at 11/14/2020 3893 Gross per 24 hour  Intake 2875.61 ml  Output 2075 ml  Net 800.61 ml       Labs: Basic Metabolic Panel: Recent Labs  Lab 11/13/20 0024 11/13/20 1734 11/14/20 0413  NA 133* 134* 132*  K 4.4 3.8 3.9  CL 100 99 98  CO2 20* 22 20*  GLUCOSE 94 126* 122*  BUN 59* 45* 63*  CREATININE 6.50* 5.66* 6.61*  CALCIUM 8.7* 8.5* 8.8*  PHOS 4.6 4.3 5.7*   Liver Function Tests: Recent Labs  Lab 11/08/20 0347 11/08/20 1706 11/13/20 0024 11/13/20 1734 11/14/20 0413  AST 53*  --   --   --   --   ALT 5  --   --   --   --   ALKPHOS 137*  --   --   --   --   BILITOT 1.9*  --   --   --   --   PROT 5.8*  --   --   --   --   ALBUMIN 1.3*   < > 1.2* 1.2* 1.1*   < > = values in this interval not displayed.   No results for input(s): LIPASE, AMYLASE in the last 168 hours. No results for input(s): AMMONIA in the last 168 hours. CBC: Recent Labs  Lab 11/10/20 0346 11/10/20 0346 11/11/20 0303 11/11/20 0303 11/12/20 7342 11/12/20 0314 11/13/20 0023  11/13/20 0453 11/14/20 0413  WBC 11.7*   < > 12.3*   < > 12.7*  --  12.7*  --  12.0*  HGB 10.2*   < > 10.2*   < > 9.4*   < > 8.2* 8.4* 7.7*  HCT 30.9*   < > 31.5*   < > 29.0*   < > 25.5* 26.0* 24.5*  MCV 91.2  --  91.3  --  92.4  --  92.7  --  94.2  PLT 175   < > 217   < > 234  --  227  --  227   < > = values in this interval not displayed.   Cardiac Enzymes: No results for input(s): CKTOTAL, CKMB, CKMBINDEX, TROPONINI in the last 168 hours. CBG: Recent Labs  Lab 11/13/20 1535 11/13/20 1947 11/14/20 0023 11/14/20 0409 11/14/20 0820  GLUCAP 84 101* 117* 128* 114*    Iron Studies: No results for input(s): IRON, TIBC, TRANSFERRIN, FERRITIN in the last 72 hours. Studies/Results: ECHOCARDIOGRAM LIMITED  Result Date: 11/13/2020    ECHOCARDIOGRAM LIMITED REPORT   Patient Name:   Travis [name] Date of Exam: 11/13/2020 Medical  Rec #:  [id number]         Height:       71.0 in Accession #:    [id number]        Weight:       183.9 lb Date of Birth:  1961-11-03        BSA:          2.035 m Patient Age:    66 years          BP:           112/68 mmHg Patient Gender: M                 HR:           91 bpm. 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 CHF, Previous Myocardial Infarction and CAD,                 Arrythmias:Atrial Fibrillation; Risk Factors:Hypertension and                 Dyslipidemia. CKD.  Sonographer:    Clayton Lefort RDCS (AE) Referring Phys: [place]  Sonographer Comments: Limited patient mobility. IMPRESSIONS  1. Left ventricular ejection fraction, by estimation, is 45 to 50%. The left ventricle has mildly decreased function. The left ventricle demonstrates regional wall motion abnormalities (see scoring diagram/findings for description). There is severe concentric left ventricular hypertrophy. Left ventricular diastolic parameters are consistent with Grade I diastolic dysfunction (impaired relaxation).  2. Right ventricular systolic function is normal. The right ventricular size is normal. There is normal pulmonary artery systolic pressure. The estimated right ventricular systolic pressure is 38.4 mmHg.  3. The mitral valve is degenerative. Mild mitral valve regurgitation. No evidence of mitral stenosis.  4. The aortic valve is tricuspid. There is mild calcification of the aortic valve. There is mild thickening of the aortic valve. Aortic valve regurgitation is mild. Mild aortic valve sclerosis is present, with no evidence of aortic valve stenosis.  5. The inferior vena cava is normal in size  with greater than 50% respiratory variability, suggesting right atrial pressure of 3 mmHg. Comparison(s): Changes from  prior study are noted. EF has improved to 45-50%. WMAs remain the mid septum into apex. RVSP has improved. FINDINGS  Left Ventricle: Left ventricular ejection fraction, by estimation, is 45 to 50%. The left ventricle has mildly decreased function. The left ventricle demonstrates regional wall motion abnormalities. The left ventricular internal cavity size was normal in size. There is severe concentric left ventricular hypertrophy. Left ventricular diastolic parameters are consistent with Grade I diastolic dysfunction (impaired relaxation).  LV Wall Scoring: The mid and distal anterior septum, mid inferoseptal segment, and apex are hypokinetic. Right Ventricle: The right ventricular size is normal. No increase in right ventricular wall thickness. Right ventricular systolic function is normal. There is normal pulmonary artery systolic pressure. The tricuspid regurgitant velocity is 1.53 m/s, and  with an assumed right atrial pressure of 3 mmHg, the estimated right ventricular systolic pressure is 81.0 mmHg. Pericardium: Trivial pericardial effusion is present. Presence of pericardial fat pad. Mitral Valve: The mitral valve is degenerative in appearance. Mild mitral annular calcification. Mild mitral valve regurgitation. No evidence of mitral valve stenosis. Tricuspid Valve: The tricuspid valve is grossly normal. Tricuspid valve regurgitation is trivial. No evidence of tricuspid stenosis. Aortic Valve: The aortic valve is tricuspid. There is mild calcification of the aortic valve. There is mild thickening of the aortic valve. Aortic valve regurgitation is mild. Mild aortic valve sclerosis is present, with no evidence of aortic valve stenosis. Pulmonic Valve: The pulmonic valve was grossly normal. Pulmonic valve regurgitation is not visualized. No evidence of pulmonic stenosis. Venous: The inferior vena cava is normal in size with greater than 50% respiratory variability, suggesting right atrial pressure of 3 mmHg. LEFT  VENTRICLE PLAX 2D LVIDd:         4.80 cm      Diastology LVIDs:         3.30 cm      LV e' medial:    5.22 cm/s LV PW:         1.81 cm      LV E/e' medial:  15.8 LV IVS:        1.78 cm      LV e' lateral:   7.72 cm/s LVOT diam:     2.30 cm      LV E/e' lateral: 10.7 LVOT Area:     4.15 cm  LV Volumes (MOD) LV vol d, MOD A2C: 193.0 ml LV vol d, MOD A4C: 157.0 ml LV vol s, MOD A2C: 109.0 ml LV vol s, MOD A4C: 75.9 ml LV SV MOD A2C:     84.0 ml LV SV MOD A4C:     157.0 ml LV SV MOD BP:      88.0 ml IVC IVC diam: 1.40 cm LEFT ATRIUM         Index LA diam:    4.20 cm 2.06 cm/m   AORTA Ao Root diam: 4.00 cm MITRAL VALVE                TRICUSPID VALVE MV Area (PHT): 4.10 cm     TR Peak grad:   9.4 mmHg MV Decel Time: 185 msec     TR Vmax:        153.00 cm/s MR Peak grad: 75.0 mmHg MR Mean grad: 50.0 mmHg     SHUNTS MR Vmax:      433.00 cm/s   Systemic Diam: 2.30 cm MR Vmean:  332.0 cm/s MV E velocity: 82.50 cm/s MV A velocity: 135.00 cm/s MV E/A ratio:  0.61 Eleonore Chiquito MD Electronically signed by Eleonore Chiquito MD Signature Date/Time: 11/13/2020/6:05:32 PM    Final     Medications: Infusions: . sodium chloride Stopped (11/10/20 1944)  . sodium chloride Stopped (10/28/20 0954)  . sodium chloride Stopped (11/04/20 0016)  . sodium chloride    . sodium chloride    .  ceFAZolin (ANCEF) IV Stopped (11/13/20 1442)  . dexmedetomidine (PRECEDEX) IV infusion 0.6 mcg/kg/hr (11/14/20 0600)  . feeding supplement (VITAL 1.5 CAL) 40 mL/hr at 11/14/20 0652  . norepinephrine (LEVOPHED) Adult infusion Stopped (11/13/20 0428)    Scheduled Medications: . sodium chloride   Intravenous Once  . amiodarone  200 mg Per Tube Daily  . atorvastatin  80 mg Per Tube q1800  . B-complex with vitamin C  1 tablet Per Tube Daily  . chlorhexidine gluconate (MEDLINE KIT)  15 mL Mouth Rinse BID  . Chlorhexidine Gluconate Cloth  6 each Topical Q0600  . clonazepam  1 mg Per Tube BID  . collagenase   Topical Daily  . docusate  100  mg Per Tube BID  . doxercalciferol  5 mcg Intravenous Q M,W,F  . feeding supplement (PROSource TF)  90 mL Per Tube QID  . insulin aspart  0-6 Units Subcutaneous Q4H  . lidocaine  1 patch Transdermal Q24H  . mouth rinse  15 mL Mouth Rinse 10 times per day  . midodrine  5 mg Per Tube TID WC  . pantoprazole (PROTONIX) IV  40 mg Intravenous Q12H  . polyethylene glycol  17 g Per Tube Daily  . QUEtiapine  100 mg Per Tube BID  . senna  1 tablet Per Tube QHS  . sevelamer carbonate  0.8 g Per Tube TID  . sodium chloride flush  10-40 mL Intracatheter Q12H  . sodium chloride flush  3 mL Intravenous Q12H  . ticagrelor  180 mg Per Tube Once  . ticagrelor  90 mg Per Tube BID    have reviewed scheduled and prn medications.  Physical Exam: General: Ill-looking male lying on bed with high flow oxygen, following simple commands Heart:RRR, s1s2 nl Lungs: Basal rhonchi, no wheezing Abdomen:soft, Non-tender, non-distended Extremities:No edema Dialysis Access: Has catheter,  AV fistula in place.  Euna Armon Prasad Keaira Whitehurst 11/14/2020,9:39 AM  LOS: 17 days  Pager: [phone]

## 2020-11-14 NOTE — Progress Notes (Signed)
VSS.  Pain (sacral) managed through shift w/aggressive turns/protocol, rest, pressure ulcer prevention.   States name, known city is [place] and able to state he knows he is in the hospital.  Pleasant and cooperative throughout shift.  Nods appropriately and follows commands.       Adventitious breath sounds. Difficulty clearing and managing own secretions.  Very thick /tenacious, white/pink tinged.  Weak cough needing continuous encouragement w/C&DB w/subglottic suctioning through day to clear.  Full Range CPT. Patient tolerating well.    Cangrelor, Dex gtt, Amio gtt, Levo gtt D/C'd today/morning - PO Amio, Brillinta, Plavix, Midodrine started.    Worked well w/PT and OT (see note)   WOCN performed Hydrotherapy w/Santyl to Sacral PU- tolerated well w/PRN Fentanyl x1.  Flexiseal inserted, black watery continuous bowel movements- MD aware, no changes from prior shifts, trending H/H, Freq dressing changes to sacrum and pressure off-loading.

## 2020-11-15 ENCOUNTER — Inpatient Hospital Stay (HOSPITAL_COMMUNITY): Payer: Medicare Other

## 2020-11-15 DIAGNOSIS — I4891 Unspecified atrial fibrillation: Secondary | ICD-10-CM | POA: Diagnosis not present

## 2020-11-15 DIAGNOSIS — I2109 ST elevation (STEMI) myocardial infarction involving other coronary artery of anterior wall: Secondary | ICD-10-CM | POA: Diagnosis not present

## 2020-11-15 LAB — RENAL FUNCTION PANEL
Albumin: 1.1 g/dL — ABNORMAL LOW (ref 3.5–5.0)
Anion gap: 13 (ref 5–15)
BUN: 102 mg/dL — ABNORMAL HIGH (ref 6–20)
CO2: 19 mmol/L — ABNORMAL LOW (ref 22–32)
Calcium: 8.9 mg/dL (ref 8.9–10.3)
Chloride: 98 mmol/L (ref 98–111)
Creatinine, Ser: 8.06 mg/dL — ABNORMAL HIGH (ref 0.61–1.24)
GFR, Estimated: 7 mL/min — ABNORMAL LOW (ref >60)
Glucose, Bld: 109 mg/dL — ABNORMAL HIGH (ref 70–99)
Phosphorus: 5.9 mg/dL — ABNORMAL HIGH (ref 2.5–4.6)
Potassium: 4.2 mmol/L (ref 3.5–5.1)
Sodium: 130 mmol/L — ABNORMAL LOW (ref 135–145)

## 2020-11-15 LAB — CBC
HCT: 21.2 % — ABNORMAL LOW (ref 39.0–52.0)
Hemoglobin: 7 g/dL — ABNORMAL LOW (ref 13.0–17.0)
MCH: 30.4 pg (ref 26.0–34.0)
MCHC: 33 g/dL (ref 30.0–36.0)
MCV: 92.2 fL (ref 80.0–100.0)
Platelets: 206 10*3/uL (ref 150–400)
RBC: 2.3 MIL/uL — ABNORMAL LOW (ref 4.22–5.81)
RDW: 17.2 % — ABNORMAL HIGH (ref 11.5–15.5)
WBC: 8.3 10*3/uL (ref 4.0–10.5)
nRBC: 0 % (ref 0.0–0.2)

## 2020-11-15 LAB — MAGNESIUM: Magnesium: 2.2 mg/dL (ref 1.7–2.4)

## 2020-11-15 LAB — GLUCOSE, CAPILLARY
Glucose-Capillary: 102 mg/dL — ABNORMAL HIGH (ref 70–99)
Glucose-Capillary: 104 mg/dL — ABNORMAL HIGH (ref 70–99)
Glucose-Capillary: 109 mg/dL — ABNORMAL HIGH (ref 70–99)
Glucose-Capillary: 111 mg/dL — ABNORMAL HIGH (ref 70–99)
Glucose-Capillary: 111 mg/dL — ABNORMAL HIGH (ref 70–99)
Glucose-Capillary: 88 mg/dL (ref 70–99)
Glucose-Capillary: 89 mg/dL (ref 70–99)

## 2020-11-15 LAB — HEMOGLOBIN AND HEMATOCRIT, BLOOD
HCT: 26.2 % — ABNORMAL LOW (ref 39.0–52.0)
Hemoglobin: 8.7 g/dL — ABNORMAL LOW (ref 13.0–17.0)

## 2020-11-15 LAB — OCCULT BLOOD X 1 CARD TO LAB, STOOL: Fecal Occult Bld: POSITIVE — AB

## 2020-11-15 LAB — PREPARE RBC (CROSSMATCH)

## 2020-11-15 MED ORDER — PANTOPRAZOLE SODIUM 40 MG IV SOLR: 40.0000 mg | Freq: Two times a day (BID) | INTRAVENOUS | Status: DC

## 2020-11-15 MED ORDER — CHLORHEXIDINE GLUCONATE 0.12 % MT SOLN
15.0000 mL | Freq: Two times a day (BID) | OROMUCOSAL | Status: DC
  Administered 2020-11-15 – 2020-12-01 (×25): 15 mL via OROMUCOSAL
  Filled 2020-11-15 (×24): qty 15

## 2020-11-15 MED ORDER — SODIUM CHLORIDE 0.9 % IV SOLN
8.0000 mg/h | INTRAVENOUS | Status: DC
  Administered 2020-11-15 – 2020-11-17 (×5): 8 mg/h via INTRAVENOUS
  Filled 2020-11-15 (×6): qty 80

## 2020-11-15 MED ORDER — MIDODRINE HCL 5 MG PO TABS
10.0000 mg | ORAL_TABLET | Freq: Three times a day (TID) | ORAL | Status: DC
  Administered 2020-11-15 – 2020-11-17 (×7): 10 mg
  Filled 2020-11-15 (×7): qty 2

## 2020-11-15 MED ORDER — ORAL CARE MOUTH RINSE
15.0000 mL | Freq: Two times a day (BID) | OROMUCOSAL | Status: DC
  Administered 2020-11-15 – 2020-11-24 (×18): 15 mL via OROMUCOSAL

## 2020-11-15 MED ORDER — SUCRALFATE 1 GM/10ML PO SUSP
1.0000 g | Freq: Four times a day (QID) | ORAL | Status: DC
  Administered 2020-11-15 – 2020-11-24 (×33): 1 g
  Filled 2020-11-15 (×34): qty 10

## 2020-11-15 MED ORDER — QUETIAPINE FUMARATE 50 MG PO TABS
50.0000 mg | ORAL_TABLET | Freq: Two times a day (BID) | ORAL | Status: DC
  Administered 2020-11-15 – 2020-11-16 (×4): 50 mg
  Filled 2020-11-15 (×5): qty 1

## 2020-11-15 MED ORDER — SODIUM CHLORIDE 0.9% IV SOLUTION: Freq: Once | INTRAVENOUS | Status: AC

## 2020-11-15 NOTE — Progress Notes (Addendum)
[facility] Progress Note Patient Name: Travis [name] DOB: 07-30-1961 MRN: [id number]   Date of Service  11/15/2020  HPI/Events of Note  Hemoglobin 7.0 gm / dl, it was 8.8 gm recently, no overt bleeding but bedside RN states his stools are dark, MAP 71  eICU Interventions  Stool occult blood check, interval H and H (at noon), type and screen. Patient is already on bid PPI.        Kerry Kass Anden Bartolo 11/15/2020, 6:23 AM

## 2020-11-15 NOTE — Progress Notes (Signed)
SLP Cancellation Note  Patient Details Name: SIDDARTH HSIUNG MRN: [id number] DOB: 08-15-1961   Cancelled treatment:       Reason Eval/Treat Not Completed: Other (comment) Pt receiving nursing care at the moment. Will check back as schedule allows for ongoing swallowing therapy.    Osie Bond., M.A. [facility] Pager [phone] Office [phone]  11/15/2020, 10:09 AM

## 2020-11-15 NOTE — Progress Notes (Signed)
NAME:  Travis [name] , MRN:  [id number], DOB:  June 25, 1961, LOS: 23 ADMISSION DATE:  10/28/2020, CONSULTATION DATE:  10/29/20 REFERRING MD:  Cardiology - Travis [name], CHIEF COMPLAINT:  Hypotension, gram positive cocci on culture, AMS  Brief History   Travis [name] is a 59 year old man with a history of HTN, ESRD on HD MWF, RUE AV fistula, hx of smoking, admitted 10/28/20 with fevers, myalgias for several days, acute chest pain.    History of present illness   On Admission, found to have STEMI, multivessel disease. Pulm edema with mild hypoxemia.  S/Post PCI to the LAD on 10/29/19. Planning for staged intervention to the left circumflex and possibly right coronary artery.  EF 55-73%, diastolic dysfunction, LVEDP 35. Underwent HD on 11/20 with 4 L volume removed.  Saturation improved after dialysis (100% on RA) Fevers noted 11/20. Blood cultures grew MSSA, noted this evening. .    This evening developed Afib with RVR 180s. Adenosine given, without improvement  Amiodarone infusion started per cardiology, 150mg  bolus, then drip.  Lopressor 5mg IV and 500ccNS given. Started on Phenylephrine, was on 2105mcg on my arrival.   Cardioverted emergently at 120J for ongoing afib with hypotension (60/40) and AMS.  Converted to sinus.  Remained moderately hypotensive (MAP 60) on phenylephrine.   Patient was given one dose zosyn, switched to ancef once cultures grew back MSSA.   Cardiac stress tests annually, last done 11/16/19.    Past Medical History  HTN ESRD, HD MWF  Significant Hospital Events   Cardioversion 11/21 Cardiac cath stent to LAD 11/20  Consults:  PCCM  Nephrology ID  Procedures:  Central line 11/21 >> 11/29 Arterial line 11/21 Repeat DCCV 11/23 11/23 ETT HD line 11/24 Arterial line 11/24 L O'[facility] CVC 11/30>>   Significant Diagnostic Tests:  11/24 TEE>>Left ventricular ejection fraction, by estimation, is 35 to 40%. The  left ventricle has moderately decreased function.Moderate to  severe mitral regurgitation. 11/23 CT Chest>>Extensive multifocal nodular and patchy airspace disease in both lungs with a slight peripheral predominance in the upper lungs. 3.1 cm nodular consolidative opacity in the right upper lobe is cavitated. Imaging features likely related to multifocal pneumonia. Septic emboli and metastatic disease considered less likely but not excluded. 11/22 LUE Vas Upper Extremity Doppler>> Arteriovenous fistula-Aneurysmal dilatation noted. 11/24 CT abdomen - no retroperitoneal hematoma 11/29 MR Brain>> No evidence acute intracranial abnormality, sinisitis, mastoid effusions 11/29>> MR Cervical Spine>> Given the provided history of bacteremia, facet joint septic arthritis is difficult to definitively exclude, but the lack of any surrounding marrow edema argues against this Cervical spondylosis  11/29 MR Thoracic Spine No significant marrow edema in thoracic spine , Mild thoracic spondylosis hypointense marrow signal throughout the thoracic spine, likely related to the patient's end-stage renal disease. multifocal airspace disease and cavitary pulmonary lesions. Small right pleural effusion. 11/29 MR Lumbar Spine:As noted above and Suspected cholecystolithiasis  Micro Data:  11/21 BCx2>>Staph aureus > MSSA by BCID 11/24 BCx2>> 11/30 BC x 2>> staph epi>>  Antimicrobials:  Zosyn 11/21 x 1 Ancef 11/21 ->  Interim history/subjective:  H/H drifting down Patient okay still has some back pain Black stool per nursing  Objective   Blood pressure 122/68, pulse (!) 107, temperature 99.9 F (37.7 C), temperature source Oral, resp. rate (!) 29, height 5\' 11"  (1.803 m), weight 85.7 kg, SpO2 92 %. CVP:  [6 mmHg] 6 mmHg      Intake/Output Summary (Last 24 hours) at 11/15/2020 0748 Last data filed at 11/15/2020 0700  Gross per 24 hour  Intake 2164.81 ml  Output 500 ml  Net 1664.81 ml   Filed Weights   11/13/20 1528 11/14/20 0500 11/15/20 0500  Weight: 81.2 kg  83.2 kg 85.7 kg   Constitutional: ill appearing, no acute distress  Eyes: EOMI, pupils equal, reactive Ears, nose, mouth, and throat: NGT in place, trachea midline Cardiovascular: tachycardic, +SEM, ext warm Respiratory: Scattered rhonci at bases, poor inspiratory effort Gastrointestinal: Soft, +BS Skin: No rashes, normal turgor Neurologic: moves all 4 ext to command but weak Psychiatric: RASS 0   H/H 10>>7 over last couple days WBC improving Glucoses improved with TF  Resolved Hospital Problem list     Assessment & Plan:   Anterior STEMI, CAD, acute CHF exacerbation- will need definitive PCI once recovers.  DES 11/20 so needs antiplatelets. 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  - Will reach back out to [facility] regarding ongoing slow H/H loss to see if repeat EGD warranted - 2 units pRBC - Check CT A/P to make sure not missing any non-GI source of bleeding - Wean precedex, lower seroquel given somnolence today - Appreciate continued SLP work with patient - Continue to hold heparin for now - Continue brilinta per cardiology - Continue amiodarone - Monitor H/H, continue PPI - Local wound care, PT/OT needs mobility if pressure ulcer to heal - iHD per nephrology, to have today  Best practice:  Diet: PO trials with SLP, TF in interim Pain/Anxiety/Delirium protocol (if indicated): see above VAP protocol (if indicated): off DVT prophylaxis: SCDs + antiplatelet agent GI prophylaxis: protonix Glucose control:  SSI for now Mobility: Bedrest Code Status: Full  Family Communication: wife at bedside daily updated Disposition: ICU pending liberation from pressors and precedex   Patient critically ill due to acute metabolic encephalopathy, shock Interventions to address this today weaning precedex, levophed Risk of deterioration without these interventions is high  I personally spent 35 minutes providing critical care not including any separately billable procedures  Erskine Emery MD [facility] 11/15/2020 7:48 AM Personal pager: [phone] If unanswered, please page CCM On-call: [phone]

## 2020-11-15 NOTE — Progress Notes (Signed)
Travis [name] NEPHROLOGY PROGRESS NOTE  Assessment/ Plan: Pt is a 59 y.o. yo male HTN, ESRD on HD admitted on 11/20 with fever, myalgia found to have NSTEMI, complicated by A. fib with RVR, bacteremia, GI bleed and VDRF.  OP HD:[facility] MWF 4h 450/800 81.5kg 2/2.25 bath RUE AVF Hep 5000+ 1000 mid-run - hect 5 ug tiw  - mircera 50 ugIVq 4 weeks, last 11/15 (due 11/29)  # ESRD - usual HD MWF.  Required CRRT on and off from 11/24-12/4.  Tolerated intermittent dialysis on Monday.  Plan for regular HD today.  # Acute STEMI- S/P LAD PCI11/20, has otherdisease inLCx/ RCA.Transitioned to brilinta from cangrelor. Per cardiology.  #MSSA cavitary PNA / bacteremia-intubated 11/23. Per PCCM. Respiratory culture 11/23 with MSSA. Blood cx +11/21.CT abd/ pelvis negative 11/23.F/U bcx's negative.Dialysis duplex 11/22 without abscessandUS of AVF wasnegative 11/27. Plan for 6 weeks of IV cefazolin per pharmacy ending 12/31/2019 .  #PEA arrest: occurred in setting of agitation/ sedation.Brief CPR.  # Shock - resolved  #Anemia ckd/ABLA - S/P PRBCs.  Hemoglobin drifting down, off of anticoagulation.  Consulting GI by primary team.  Plan for blood transfusion today.  #Secondary hyperparathyroidism: Increase sevelamer.  Monitor phosphorus level.  #Hyponatremia now managed with dialysis.  #Afib:on amio and hep gtt, s/p DCCV.   #Severe MR:per cardiology.  Subjective: Seen and examined ICU.  Plan for intermittent HD today.  Blood pressure acceptable.  No new event.  Objective Vital signs in last 24 hours: Vitals:   11/15/20 0815 11/15/20 0830 11/15/20 0845 11/15/20 0900  BP: 111/64 107/63 109/67 109/66  Pulse: (!) 109 (!) 113 (!) 109 (!) 105  Resp: (!) 28 (!) 33 (!) 34 (!) 29  Temp:    98.7 F (37.1 C)  TempSrc:    Oral  SpO2: 96% 93% 95% 96%  Weight:      Height:       Weight change: 2.3 kg  Intake/Output Summary (Last 24 hours) at 11/15/2020  0912 Last data filed at 11/15/2020 0900 Gross per 24 hour  Intake 2205.35 ml  Output 500 ml  Net 1705.35 ml       Labs: Basic Metabolic Panel: Recent Labs  Lab 11/13/20 1734 11/14/20 0413 11/15/20 0508  NA 134* 132* 130*  K 3.8 3.9 4.2  CL 99 98 98  CO2 22 20* 19*  GLUCOSE 126* 122* 109*  BUN 45* 63* 102*  CREATININE 5.66* 6.61* 8.06*  CALCIUM 8.5* 8.8* 8.9  PHOS 4.3 5.7* 5.9*   Liver Function Tests: Recent Labs  Lab 11/13/20 1734 11/14/20 0413 11/15/20 0508  ALBUMIN 1.2* 1.1* 1.1*   No results for input(s): LIPASE, AMYLASE in the last 168 hours. No results for input(s): AMMONIA in the last 168 hours. CBC: Recent Labs  Lab 11/12/20 0314 11/12/20 0314 11/13/20 0023 11/13/20 0453 11/14/20 0413 11/14/20 1218 11/15/20 0508  WBC 12.7*   < > 12.7*   < > 12.0* 9.5 8.3  HGB 9.4*   < > 8.2*   < > 7.7* 8.8* 7.0*  HCT 29.0*   < > 25.5*   < > 24.5* 26.4* 21.2*  MCV 92.4  --  92.7  --  94.2 91.7 92.2  PLT 234   < > 227   < > 227 228 206   < > = values in this interval not displayed.   Cardiac Enzymes: No results for input(s): CKTOTAL, CKMB, CKMBINDEX, TROPONINI in the last 168 hours. CBG: Recent Labs  Lab 11/14/20 1555 11/14/20 2014  11/15/20 0052 11/15/20 0443 11/15/20 0806  GLUCAP 92 105* 111* 109* 102*    Iron Studies: No results for input(s): IRON, TIBC, TRANSFERRIN, FERRITIN in the last 72 hours. Studies/Results: DG Chest 1 View  Result Date: 11/14/2020 CLINICAL DATA:  Congestion, possible aspiration EXAM: CHEST  1 VIEW COMPARISON:  Portable exam 1601 hours compared to 11/08/2020 FINDINGS: Feeding tube extends into the stomach. RIGHT jugular line tip projects over RIGHT atrium unchanged. LEFT subclavian central venous catheter tip projects over LEFT brachiocephalic vein near SVC confluence unchanged. Enlargement of cardiac silhouette. Moderate partially loculated RIGHT pleural effusion. Bibasilar consolidation versus atelectasis. Upper lungs clear. No  pneumothorax. IMPRESSION: Partially loculated RIGHT pleural effusion. Enlargement of cardiac silhouette. Atelectasis versus consolidation of the lower lobes. Electronically Signed   By: Lavonia Dana M.D.   On: 11/14/2020 19:00   DG Swallowing Func-Speech Pathology  Result Date: 11/14/2020 Objective Swallowing Evaluation: Type of Study: MBS-Modified Barium Swallow Study  Patient Details Name: Travis [name] MRN: [id number] Date of Birth: 07-Nov-1961 Today's Date: 11/14/2020 Time: SLP Start Time (ACUTE ONLY): 1400 -SLP Stop Time (ACUTE ONLY): 1425 SLP Time Calculation (min) (ACUTE ONLY): 25 min Past Medical History: Past Medical History: Diagnosis Date . Acute ST elevation myocardial infarction (STEMI) of anterolateral wall ([place]) 10/28/2020  Culprit lesion, 99% ulcerated mid LAD at D1 -> DES PCI . Anemia of chronic disease 01/30/2015 . Coronary artery disease involving native coronary artery of native heart with unstable angina pectoris ([place]) 10/28/2020  Acute anterolateral STEMI: 3V CAD: Culprit Lesion = 99% mid LAD lesion (just after D1) - DES PCI; 99% ostial AV groove LCx; & 80% calcified napkin ring proximal RCA lesion with extensive calcification throughout the RCA. Successful PTCA and DES PCI of the LAD crossing D1 -resolute Onyx DES 2.75 mm x 18 mm postdilated to 3.1 mm. With PCI, there . End-stage renal disease on hemodialysis ([place])  . GERD (gastroesophageal reflux disease)   takes Nexium . Hyperlipidemia with target LDL less than 70 10/28/2020 . Hypertension  . Pneumonia   as a child . 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 Sleep apnea   uses c-pap 2 yrs Past Surgical History: Past Surgical History: Procedure Laterality Date . AV FISTULA PLACEMENT Left 12/22/2017  Procedure: REPAIR PSEUDOANEURYSM ARTERIOVENOUS (AV) FISTULA;  Surgeon: [name], MD;  Location: [place];  Service: Vascular;  Laterality: Left;  . BASCILIC VEIN TRANSPOSITION Left 01/05/2014  Procedure: LEFT 1ST STAGE BASCILIC VEIN TRANSPOSITION;  Surgeon: Conrad Pantego, MD;  Location: [place];  Service: Vascular;  Laterality: Left; . BASCILIC VEIN TRANSPOSITION Left 07/26/2014  Procedure: Left Arm Brachial Vein Transposition Second Stage;  Surgeon: Conrad Fulton, MD;  Location: [place];  Service: Vascular;  Laterality: Left; . COLONOSCOPY   . COLONOSCOPY N/A 02/02/2015  Procedure: COLONOSCOPY;  Surgeon: [name], MD;  Location[facility] END[facility]COPY;  Service: Endoscopy;  Laterality: N/A; . CORONARY STENT INTERVENTION N/A 10/28/2020  Procedure: CORONARY STENT INTERVENTION;  Surgeon: Leonie Man, MD;  Location: [place] CV LAB;  Service: Cardiovascular;  Laterality: N/A; . CORONARY/GRAFT ACUTE MI REVASCULARIZATION N/A 10/28/2020  Procedure: Coronary/Graft Acute MI Revascularization;  Surgeon: Leonie Man, MD;  Location: [place] CV LAB;  Service: Cardiovascular;  Laterality: N/A; . ESOPHAGOGASTRODUODENOSCOPY Left 01/31/2015  Procedure: ESOPHAGOGASTRODUODENOSCOPY (EGD);  Surgeon: [name], MD;  Location: [facility] ENDOSCOPY;  Service: Endoscopy;  Laterality: Left; . ESOPHAGOGASTRODUODENOSCOPY  N/A 11/09/2020  Procedure: ESOPHAGOGASTRODUODENOSCOPY (EGD);  Surgeon: Clarene Essex, MD;  Location: [place];  Service: Endoscopy;  Laterality: N/A; . EVALUATION UNDER ANESTHESIA WITH HEMORRHOIDECTOMY N/A 02/26/2018  Procedure: ANORECTAL EXAM UNDER ANESTHESIA  HEMORRHOIDECTOMY x 2  HEMORRHOIDAL LIGATION AND PEXY;  Surgeon: Kmarion Boston, MD;  Location: [facility];  Service: General;  Laterality: N/A; . GIVENS CAPSULE STUDY N/A 01/31/2015  Procedure: GIVENS CAPSULE STUDY;  Surgeon: [name], MD;  Location: [facility] ENDOSCOPY;  Service: Endoscopy;  Laterality: N/A; . LEFT HEART CATH AND CORONARY ANGIOGRAPHY N/A 10/28/2020  Procedure: LEFT HEART CATH AND CORONARY ANGIOGRAPHY;  Surgeon: Leonie Man, MD;  Location: [place] CV LAB;  Service: Cardiovascular;  Laterality:  N/A; . MOUTH SURGERY    teeth cleaning . 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 He describes his chest tightness, 7/10, with radiation to his back (between his scapula), he has not exerted. EKG in ED is suggestive of anterolateral ST elevations. In the ED he was given thorazine for hiccups; CXR 10/31/20 indicated New patchy right mid and lower lung infiltrate; CT chest 10/31/20 yielded results including: Extensive multifocal nodular and patchy airspace disease in both lungs with a slight peripheral predominance in the upper lungs. 3.1 cm nodular consolidative opacity in the right upper lobe is cavitated. Imaging features likely related to multifocal pneumonia. Pt intubated 11/23-12/3/21; gastroenterology consult 11/09/20 with UGI completed and results as follows: Moderately severe reflux and erosive esophagitis  with no bleeding; Hematin (altered blood/coffee-ground-like material) in the gastric antrum, in the cardia, in  the gastric fundus and in the gastric body. Fluid aspiration performed. Probable ischemic duodenitis. Normal third portion of the duodenum. The examination was otherwise normal. BSE generated.  Subjective: patient awake and alert but did not attempt to verbalize, was having difficulty sitting up and was unable to keep head up during study Assessment / Plan / Recommendation CHL IP CLINICAL IMPRESSIONS 11/14/2020 Clinical Impression Patient presents with a moderate oropharyngeal dysphagia with likely impact from current cognitive status and pain leading to difficulty maintaining adequate posture and participation during MBS. Patient kept head in a downward, almost chin tuck posture and was not able to raise head despite tactile and physical cues. Oral delay in transit led to  premature spillage into vallecular sinus, and swallow initiation occuring at level of vallecular sinus. With puree bolus, patient exhibited more significant delay in swallow initiation and had mild-mod vallecular residuals post initial swallow. No penetration or aspiration observed and no significant amount of pyriform sinus residuals was observed. UES opening appeared [facility] and without evidence of retrograde movement of boluses. Currently, patient is not safe for full PO's, but this is largely due to his inability to maintain adequate participation combined with his overall weakness. Recommendation is remain NPO but allow for SLP to assess patient toleration of trials of thin liquids, nectar thick liquids, puree solids at bedside. SLP Visit Diagnosis Dysphagia, oropharyngeal phase (R13.12) Attention and concentration deficit following -- Frontal lobe and executive function deficit following -- Impact on safety and function Risk for inadequate nutrition/hydration;Mild aspiration risk;Moderate aspiration risk   CHL IP TREATMENT RECOMMENDATION 11/14/2020 Treatment Recommendations Therapy as outlined in treatment plan below   Prognosis 11/14/2020 Prognosis for Safe Diet Advancement Good Barriers to Reach Goals -- Barriers/Prognosis Comment --  CHL IP DIET RECOMMENDATION 11/14/2020 SLP Diet Recommendations NPO;Other (Comment) Liquid Administration via -- Medication Administration Via alternative means Compensations -- Postural Changes --   CHL IP OTHER RECOMMENDATIONS 11/14/2020 Recommended Consults -- Oral Care Recommendations Oral care QID Other Recommendations --   CHL IP FOLLOW UP RECOMMENDATIONS 11/14/2020 Follow up Recommendations 24 hour supervision/assistance;Skilled Nursing facility;Inpatient Rehab   CHL IP FREQUENCY AND DURATION 11/14/2020 Speech Therapy Frequency (ACUTE ONLY) min 2x/week Treatment Duration 1 week      CHL IP ORAL PHASE 11/14/2020 Oral Phase -- Oral - Pudding Teaspoon -- Oral - Pudding Cup -- Oral -  Honey Teaspoon -- Oral - Honey Cup -- Oral - Nectar Teaspoon Premature spillage;Left anterior bolus loss;Right anterior bolus loss;Weak lingual manipulation;Delayed oral transit Oral - Nectar Cup Delayed oral transit;Weak lingual manipulation;Right anterior bolus loss;Left anterior bolus loss;Premature spillage Oral - Nectar Straw -- Oral - Thin Teaspoon Premature spillage;Weak lingual manipulation;Left anterior bolus loss;Right anterior bolus loss;Delayed oral transit Oral - Thin Cup -- Oral - Thin Straw -- Oral - Puree Weak lingual manipulation;Delayed oral transit;Premature spillage Oral - Mech Soft -- Oral - Regular -- Oral - Multi-Consistency -- Oral - Pill -- Oral Phase - Comment --  CHL IP PHARYNGEAL PHASE 11/14/2020 Pharyngeal Phase -- Pharyngeal- Pudding Teaspoon -- Pharyngeal -- Pharyngeal- Pudding Cup -- Pharyngeal -- Pharyngeal- Honey Teaspoon -- Pharyngeal -- Pharyngeal- Honey Cup -- Pharyngeal -- Pharyngeal- Nectar Teaspoon Delayed swallow initiation-vallecula;Pharyngeal residue - valleculae Pharyngeal -- Pharyngeal- Nectar Cup Delayed swallow initiation-vallecula;Pharyngeal residue - valleculae Pharyngeal -- Pharyngeal- Nectar Straw -- Pharyngeal -- Pharyngeal- Thin Teaspoon Delayed swallow initiation-vallecula;Pharyngeal residue - valleculae Pharyngeal -- Pharyngeal- Thin Cup Pharyngeal residue - valleculae;Delayed swallow initiation-vallecula;Reduced tongue base retraction Pharyngeal -- Pharyngeal- Thin Straw -- Pharyngeal -- Pharyngeal- Puree Pharyngeal residue - valleculae;Delayed swallow initiation-vallecula;Reduced tongue base retraction Pharyngeal -- Pharyngeal- Mechanical Soft -- Pharyngeal -- Pharyngeal- Regular -- Pharyngeal -- Pharyngeal- Multi-consistency -- Pharyngeal -- Pharyngeal- Pill -- Pharyngeal -- Pharyngeal Comment --  CHL IP CERVICAL ESOPHAGEAL PHASE 11/14/2020 Cervical Esophageal Phase WFL Pudding Teaspoon -- Pudding Cup -- Honey Teaspoon -- Honey Cup -- Nectar Teaspoon -- Nectar  Cup -- Nectar Straw -- Thin Teaspoon -- Thin Cup -- Thin Straw -- Puree -- Mechanical Soft -- Regular -- Multi-consistency -- Pill -- Cervical Esophageal Comment -- Sonia Baller, MA, CCC-SLP Speech Therapy [facility] Acute [facility]hab             ECHOCARDIOGRAM LIMITED  Result Date: 11/13/2020    ECHOCARDIOGRAM LIMITED REPORT   Patient Name:   Travis [name] Date of Exam: 11/13/2020 Medical Rec #:  [id number]         Height:       71.0 in Accession #:    [id number]        Weight:       183.9 lb Date of Birth:  [birth year]        BSA:          2.035 m Patient Age:    57 years          BP:           112/68 mmHg Patient Gender: M                 HR:           91 bpm. Exam Location:  Inpatient Procedure: Limited Echo, Cardiac Doppler and Color Doppler Indications:    Cardiomyopathy-Ischemic 414.8 / I25.5  History:        Patient has  prior history of Echocardiogram examinations, most                 recent 10/31/2020. CHF, Previous Myocardial Infarction and CAD,                 Arrythmias:Atrial Fibrillation; Risk Factors:Hypertension and                 Dyslipidemia. CKD.  Sonographer:    Clayton Lefort RDCS (AE) Referring Phys: [place]  Sonographer Comments: Limited patient mobility. IMPRESSIONS  1. Left ventricular ejection fraction, by estimation, is 45 to 50%. The left ventricle has mildly decreased function. The left ventricle demonstrates regional wall motion abnormalities (see scoring diagram/findings for description). There is severe concentric left ventricular hypertrophy. Left ventricular diastolic parameters are consistent with Grade I diastolic dysfunction (impaired relaxation).  2. Right ventricular systolic function is normal. The right ventricular size is normal. There is normal pulmonary artery systolic pressure. The estimated right ventricular systolic pressure is 72.5 mmHg.  3. The mitral valve is degenerative. Mild mitral valve regurgitation. No evidence of mitral stenosis.  4. The aortic valve is  tricuspid. There is mild calcification of the aortic valve. There is mild thickening of the aortic valve. Aortic valve regurgitation is mild. Mild aortic valve sclerosis is present, with no evidence of aortic valve stenosis.  5. The inferior vena cava is normal in size with greater than 50% respiratory variability, suggesting right atrial pressure of 3 mmHg. Comparison(s): Changes from prior study are noted. EF has improved to 45-50%. WMAs remain the mid septum into apex. RVSP has improved. FINDINGS  Left Ventricle: Left ventricular ejection fraction, by estimation, is 45 to 50%. The left ventricle has mildly decreased function. The left ventricle demonstrates regional wall motion abnormalities. The left ventricular internal cavity size was normal in size. There is severe concentric left ventricular hypertrophy. Left ventricular diastolic parameters are consistent with Grade I diastolic dysfunction (impaired relaxation).  LV Wall Scoring: The mid and distal anterior septum, mid inferoseptal segment, and apex are hypokinetic. Right Ventricle: The right ventricular size is normal. No increase in right ventricular wall thickness. Right ventricular systolic function is normal. There is normal pulmonary artery systolic pressure. The tricuspid regurgitant velocity is 1.53 m/s, and  with an assumed right atrial pressure of 3 mmHg, the estimated right ventricular systolic pressure is 36.6 mmHg. Pericardium: Trivial pericardial effusion is present. Presence of pericardial fat pad. Mitral Valve: The mitral valve is degenerative in appearance. Mild mitral annular calcification. Mild mitral valve regurgitation. No evidence of mitral valve stenosis. Tricuspid Valve: The tricuspid valve is grossly normal. Tricuspid valve regurgitation is trivial. No evidence of tricuspid stenosis. Aortic Valve: The aortic valve is tricuspid. There is mild calcification of the aortic valve. There is mild thickening of the aortic valve. Aortic  valve regurgitation is mild. Mild aortic valve sclerosis is present, with no evidence of aortic valve stenosis. Pulmonic Valve: The pulmonic valve was grossly normal. Pulmonic valve regurgitation is not visualized. No evidence of pulmonic stenosis. Venous: The inferior vena cava is normal in size with greater than 50% respiratory variability, suggesting right atrial pressure of 3 mmHg. LEFT VENTRICLE PLAX 2D LVIDd:         4.80 cm      Diastology LVIDs:         3.30 cm      LV e' medial:    5.22 cm/s LV PW:         1.81 cm  LV E/e' medial:  15.8 LV IVS:        1.78 cm      LV e' lateral:   7.72 cm/s LVOT diam:     2.30 cm      LV E/e' lateral: 10.7 LVOT Area:     4.15 cm  LV Volumes (MOD) LV vol d, MOD A2C: 193.0 ml LV vol d, MOD A4C: 157.0 ml LV vol s, MOD A2C: 109.0 ml LV vol s, MOD A4C: 75.9 ml LV SV MOD A2C:     84.0 ml LV SV MOD A4C:     157.0 ml LV SV MOD BP:      88.0 ml IVC IVC diam: 1.40 cm LEFT ATRIUM         Index LA diam:    4.20 cm 2.06 cm/m   AORTA Ao Root diam: 4.00 cm MITRAL VALVE                TRICUSPID VALVE MV Area (PHT): 4.10 cm     TR Peak grad:   9.4 mmHg MV Decel Time: 185 msec     TR Vmax:        153.00 cm/s MR Peak grad: 75.0 mmHg MR Mean grad: 50.0 mmHg     SHUNTS MR Vmax:      433.00 cm/s   Systemic Diam: 2.30 cm MR Vmean:     332.0 cm/s MV E velocity: 82.50 cm/s MV A velocity: 135.00 cm/s MV E/A ratio:  0.61 Eleonore Chiquito MD Electronically signed by Eleonore Chiquito MD Signature Date/Time: 11/13/2020/6:05:32 PM    Final     Medications: Infusions: . sodium chloride Stopped (11/10/20 1944)  . sodium chloride Stopped (10/28/20 0954)  . sodium chloride Stopped (11/04/20 0016)  . sodium chloride    . sodium chloride Stopped (11/14/20 1600)  .  ceFAZolin (ANCEF) IV Stopped (11/13/20 1442)  . dexmedetomidine (PRECEDEX) IV infusion Stopped (11/14/20 1103)  . feeding supplement (VITAL 1.5 CAL) 55 mL/hr at 11/15/20 0700  . norepinephrine (LEVOPHED) Adult infusion Stopped (11/13/20  0428)    Scheduled Medications: . sodium chloride   Intravenous Once  . sodium chloride   Intravenous Once  . amiodarone  200 mg Per Tube Daily  . atorvastatin  80 mg Per Tube q1800  . B-complex with vitamin C  1 tablet Per Tube Daily  . chlorhexidine gluconate (MEDLINE KIT)  15 mL Mouth Rinse BID  . Chlorhexidine Gluconate Cloth  6 each Topical Q0600  . clonazepam  1 mg Per Tube BID  . collagenase   Topical Daily  . docusate  100 mg Per Tube BID  . doxercalciferol  5 mcg Intravenous Q M,W,F  . feeding supplement (PROSource TF)  90 mL Per Tube QID  . guaiFENesin  15 mL Per Tube Q4H  . insulin aspart  0-6 Units Subcutaneous Q4H  . lidocaine  1 patch Transdermal Q24H  . mouth rinse  15 mL Mouth Rinse 10 times per day  . midodrine  10 mg Per Tube TID WC  . pantoprazole (PROTONIX) IV  40 mg Intravenous Q12H  . polyethylene glycol  17 g Per Tube Daily  . QUEtiapine  50 mg Per Tube BID  . senna  1 tablet Per Tube QHS  . sevelamer carbonate  1.6 g Per Tube TID WC  . sodium chloride flush  10-40 mL Intracatheter Q12H  . sodium chloride flush  3 mL Intravenous Q12H  . ticagrelor  90 mg Per Tube BID  have reviewed scheduled and prn medications.  Physical Exam: General: Ill-looking male lying on bed with high flow oxygen, alert and following simple commands. Heart:RRR, s1s2 nl Lungs: Basal rhonchi, no wheezing Abdomen:soft, Non-tender, non-distended Extremities:No edema Dialysis Access: Has catheter,  AV fistula in place.  Elijiah Mickley Prasad Rudell Ortman 11/15/2020,9:12 AM  LOS: 18 days  Pager: [phone]

## 2020-11-15 NOTE — Progress Notes (Signed)
Progress Note  Patient Name: Travis [name] Date of Encounter: 11/15/2020  [facility] Cardiologist: Glenetta Hew, MD   Subjective   No complaints  Inpatient Medications    Scheduled Meds: . sodium chloride   Intravenous Once  . sodium chloride   Intravenous Once  . amiodarone  200 mg Per Tube Daily  . atorvastatin  80 mg Per Tube q1800  . B-complex with vitamin C  1 tablet Per Tube Daily  . chlorhexidine gluconate (MEDLINE KIT)  15 mL Mouth Rinse BID  . Chlorhexidine Gluconate Cloth  6 each Topical Q0600  . clonazepam  1 mg Per Tube BID  . collagenase   Topical Daily  . docusate  100 mg Per Tube BID  . doxercalciferol  5 mcg Intravenous Q M,W,F  . feeding supplement (PROSource TF)  90 mL Per Tube QID  . guaiFENesin  15 mL Per Tube Q4H  . insulin aspart  0-6 Units Subcutaneous Q4H  . lidocaine  1 patch Transdermal Q24H  . mouth rinse  15 mL Mouth Rinse 10 times per day  . midodrine  10 mg Per Tube TID WC  . pantoprazole (PROTONIX) IV  40 mg Intravenous Q12H  . polyethylene glycol  17 g Per Tube Daily  . QUEtiapine  50 mg Per Tube BID  . senna  1 tablet Per Tube QHS  . sevelamer carbonate  1.6 g Per Tube TID WC  . sodium chloride flush  10-40 mL Intracatheter Q12H  . sodium chloride flush  3 mL Intravenous Q12H  . ticagrelor  90 mg Per Tube BID   Continuous Infusions: . sodium chloride Stopped (11/10/20 1944)  . sodium chloride Stopped (10/28/20 0954)  . sodium chloride Stopped (11/04/20 0016)  . sodium chloride    . sodium chloride Stopped (11/14/20 1600)  .  ceFAZolin (ANCEF) IV Stopped (11/13/20 1442)  . dexmedetomidine (PRECEDEX) IV infusion Stopped (11/14/20 1103)  . feeding supplement (VITAL 1.5 CAL) 55 mL/hr at 11/15/20 0700  . norepinephrine (LEVOPHED) Adult infusion Stopped (11/13/20 0428)   PRN Meds: sodium chloride, sodium chloride, sodium chloride, acetaminophen, alteplase, bisacodyl, haloperidol lactate, heparin, lidocaine (PF),  lidocaine-prilocaine, LORazepam, ondansetron (ZOFRAN) IV, pentafluoroprop-tetrafluoroeth, sodium chloride, sodium chloride flush, sodium chloride flush   Vital Signs    Vitals:   11/15/20 0500 11/15/20 0600 11/15/20 0700 11/15/20 0805  BP: 113/61 (!) 112/57 122/68   Pulse: (!) 106 (!) 110 (!) 107   Resp: (!) 36 (!) 29 (!) 29   Temp:    98.6 F (37 C)  TempSrc: Oral   Oral  SpO2: 95% 96% 92%   Weight: 85.7 kg     Height:        Intake/Output Summary (Last 24 hours) at 11/15/2020 0825 Last data filed at 11/15/2020 0800 Gross per 24 hour  Intake 1978.06 ml  Output 500 ml  Net 1478.06 ml   Last 3 Weights 11/15/2020 11/14/2020 11/13/2020  Weight (lbs) 188 lb 15 oz 183 lb 6.8 oz 179 lb 0.2 oz  Weight (kg) 85.7 kg 83.2 kg 81.2 kg      Telemetry    Sinus tachycardia- Personally Reviewed  ECG    Sinus tach, old septal infarct-personally reviewed.   Physical Exam   General: Well developed, well nourished, ill appearing HEENT: OP clear Neuro: No focal deficits  Psychiatric: flat affect Neck: No JVD Lungs:Clear bilaterally, no wheezes, rhonci, crackles Cardiovascular: Tachy, regular. No murmurs, gallops or rubs. Abdomen:Soft. Bowel sounds present. Non-tender.  Extremities: No lower  extremity edema.   Labs    High Sensitivity Troponin:   Recent Labs  Lab 10/28/20 0629 10/28/20 1046 10/28/20 2040 10/29/20 2121 10/30/20 0548  TROPONINIHS 3,155* >27,000* >27,000* >27,000* >27,000*      Chemistry Recent Labs  Lab 11/13/20 1734 11/14/20 0413 11/15/20 0508  NA 134* 132* 130*  K 3.8 3.9 4.2  CL 99 98 98  CO2 22 20* 19*  GLUCOSE 126* 122* 109*  BUN 45* 63* 102*  CREATININE 5.66* 6.61* 8.06*  CALCIUM 8.5* 8.8* 8.9  ALBUMIN 1.2* 1.1* 1.1*  GFRNONAA 11* 9* 7*  ANIONGAP _0 Hematology Recent Labs  Lab 11/14/20 0413 11/14/20 1218 11/15/20 0508  WBC 12.0* 9.5 8.3  RBC 2.60* 2.88* 2.30*  HGB 7.7* 8.8* 7.0*  HCT 24.5* 26.4* 21.2*  MCV 94.2 91.7 92.2   MCH 29.6 30.6 30.4  MCHC 31.4 33.3 33.0  RDW 17.6* 17.4* 17.2*  PLT 227 228 206    BNPNo results for input(s): BNP, PROBNP in the last 168 hours.   DDimer No results for input(s): DDIMER in the last 168 hours.   Radiology    DG Chest 1 View  Result Date: 11/14/2020 CLINICAL DATA:  Congestion, possible aspiration EXAM: CHEST  1 VIEW COMPARISON:  Portable exam 1601 hours compared to 11/08/2020 FINDINGS: Feeding tube extends into the stomach. RIGHT jugular line tip projects over RIGHT atrium unchanged. LEFT subclavian central venous catheter tip projects over LEFT brachiocephalic vein near SVC confluence unchanged. Enlargement of cardiac silhouette. Moderate partially loculated RIGHT pleural effusion. Bibasilar consolidation versus atelectasis. Upper lungs clear. No pneumothorax. IMPRESSION: Partially loculated RIGHT pleural effusion. Enlargement of cardiac silhouette. Atelectasis versus consolidation of the lower lobes. Electronically Signed   By: Lavonia Dana M.D.   On: 11/14/2020 19:00   DG Swallowing Func-Speech Pathology  Result Date: 11/14/2020 Objective Swallowing Evaluation: Type of Study: MBS-Modified Barium Swallow Study  Patient Details Name: Travis [name] MRN: [id number] Date of Birth: [birth year] Today's Date: 11/14/2020 Time: SLP Start Time (ACUTE ONLY): 1400 -SLP Stop Time (ACUTE ONLY): 1425 SLP Time Calculation (min) (ACUTE ONLY): 25 min Past Medical History: Past Medical History: Diagnosis Date . Acute ST elevation myocardial infarction (STEMI) of anterolateral wall ([place]) 10/28/2020  Culprit lesion, 99% ulcerated mid LAD at D1 -> DES PCI . Anemia of chronic disease 01/30/2015 . Coronary artery disease involving native coronary artery of native heart with unstable angina pectoris ([place]) 10/28/2020  Acute anterolateral STEMI: 3V CAD: Culprit Lesion = 99% mid LAD lesion (just after D1) - DES PCI; 99% ostial AV groove LCx; & 80% calcified napkin ring proximal RCA lesion with extensive  calcification throughout the RCA. Successful PTCA and DES PCI of the LAD crossing D1 -resolute Onyx DES 2.75 mm x 18 mm postdilated to 3.1 mm. With PCI, there . End-stage renal disease on hemodialysis ([place])  . GERD (gastroesophageal reflux disease)   takes Nexium . Hyperlipidemia with target LDL less than 70 10/28/2020 . Hypertension  . Pneumonia   as a child . 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 Sleep apnea   uses c-pap 2 yrs Past Surgical History: Past Surgical History: Procedure Laterality Date . AV FISTULA PLACEMENT Left 12/22/2017  Procedure: REPAIR PSEUDOANEURYSM ARTERIOVENOUS (AV) FISTULA;  Surgeon: [name], MD;  Location: [place];  Service: Vascular;  Laterality: Left; . BASCILIC VEIN TRANSPOSITION Left 01/05/2014  Procedure: LEFT 1ST STAGE BASCILIC VEIN TRANSPOSITION;  Surgeon: Conrad Monterey, MD;  Location: [place];  Service: Vascular;  Laterality: Left; . BASCILIC VEIN TRANSPOSITION Left 07/26/2014  Procedure: Left Arm Brachial Vein Transposition Second Stage;  Surgeon: Conrad Glen Elder, MD;  Location: [place];  Service: Vascular;  Laterality: Left; . COLONOSCOPY   . COLONOSCOPY N/A 02/02/2015  Procedure: COLONOSCOPY;  Surgeon: [name], MD;  Location: [facility] END[facility]COPY;  Service: Endoscopy;  Laterality: N/A; . CORONARY STENT INTERVENTION N/A 10/28/2020  Procedure: CORONARY STENT INTERVENTION;  Surgeon: Leonie Man, MD;  Location: [place] CV LAB;  Service: Cardiovascular;  Laterality: N/A; . CORONARY/GRAFT ACUTE MI REVASCULARIZATION N/A 10/28/2020  Procedure: Coronary/Graft Acute MI Revascularization;  Surgeon: Leonie Man, MD;  Location: [place] CV LAB;  Service: Cardiovascular;  Laterality: N/A; . ESOPHAGOGASTRODUODENOSCOPY Left 01/31/2015  Procedure: ESOPHAGOGASTRODUODENOSCOPY (EGD);  Surgeon: [name], MD;  Location[facility] ENDOSCOPY;  Service: Endoscopy;  Laterality: Left; .  ESOPHAGOGASTRODUODENOSCOPY N/A 11/09/2020  Procedure: ESOPHAGOGASTRODUODENOSCOPY (EGD);  Surgeon: Clarene Essex, MD;  Location: [place];  Service: Endoscopy;  Laterality: N/A; . EVALUATION UNDER ANESTHESIA WITH HEMORRHOIDECTOMY N/A 02/26/2018  Procedure: ANORECTAL EXAM UNDER ANESTHESIA  HEMORRHOIDECTOMY x 2  HEMORRHOIDAL LIGATION AND PEXY;  Surgeon: Shahram Boston, MD;  Location: [facility];  Service: General;  Laterality: N/A; . GIVENS CAPSULE STUDY N/A 01/31/2015  Procedure: GIVENS CAPSULE STUDY;  Surgeon: [name], MD;  Location: [facility] ENDOSCOPY;  Service: Endoscopy;  Laterality: N/A; . LEFT HEART CATH AND CORONARY ANGIOGRAPHY N/A 10/28/2020  Procedure: LEFT HEART CATH AND CORONARY ANGIOGRAPHY;  Surgeon: Leonie Man, MD;  Location: [place] [facility] LAB;  Service: Cardiovascular;  Laterality: N/A; . MOUTH SURGERY    teeth cleaning . 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 He describes his chest tightness, 7/10, with radiation to his back (between his scapula), he has not exerted. EKG in ED is suggestive of anterolateral ST elevations. In the ED he was given thorazine for hiccups; CXR 10/31/20 indicated New patchy right mid and lower lung infiltrate; CT chest 10/31/20 yielded results including: Extensive multifocal nodular and patchy airspace disease in both lungs with a slight peripheral predominance in the upper lungs. 3.1 cm nodular consolidative opacity in the right upper lobe is cavitated. Imaging features likely related to multifocal pneumonia. Pt intubated 11/23-12/3/21; gastroenterology consult 11/09/20 with UGI completed and results as follows: Moderately severe reflux and erosive esophagitis  with no bleeding; Hematin (altered blood/coffee-ground-like material) in the gastric antrum, in  the cardia, in  the gastric fundus and in the gastric body. Fluid aspiration performed. Probable ischemic duodenitis. Normal third portion of the duodenum. The examination was otherwise normal. BSE generated.  Subjective: patient awake and alert but did not attempt to verbalize, was having difficulty sitting up and was unable to keep head up during study Assessment / Plan / Recommendation CHL IP CLINICAL IMPRESSIONS 11/14/2020 Clinical Impression Patient presents with a moderate oropharyngeal dysphagia with likely impact from current cognitive status and pain leading to difficulty maintaining adequate posture and participation during MBS. Patient kept head in a downward, almost chin tuck posture and was not able to raise head despite tactile and physical cues. Oral delay in transit led to premature spillage into vallecular sinus, and swallow initiation occuring at level of  vallecular sinus. With puree bolus, patient exhibited more significant delay in swallow initiation and had mild-mod vallecular residuals post initial swallow. No penetration or aspiration observed and no significant amount of pyriform sinus residuals was observed. UES opening appeared [facility] and without evidence of retrograde movement of boluses. Currently, patient is not safe for full PO's, but this is largely due to his inability to maintain adequate participation combined with his overall weakness. Recommendation is remain NPO but allow for SLP to assess patient toleration of trials of thin liquids, nectar thick liquids, puree solids at bedside. SLP Visit Diagnosis Dysphagia, oropharyngeal phase (R13.12) Attention and concentration deficit following -- Frontal lobe and executive function deficit following -- Impact on safety and function Risk for inadequate nutrition/hydration;Mild aspiration risk;Moderate aspiration risk   CHL IP TREATMENT RECOMMENDATION 11/14/2020 Treatment Recommendations Therapy as outlined in treatment plan below   Prognosis  11/14/2020 Prognosis for Safe Diet Advancement Good Barriers to Reach Goals -- Barriers/Prognosis Comment -- CHL IP DIET RECOMMENDATION 11/14/2020 SLP Diet Recommendations NPO;Other (Comment) Liquid Administration via -- Medication Administration Via alternative means Compensations -- Postural Changes --   CHL IP OTHER RECOMMENDATIONS 11/14/2020 Recommended Consults -- Oral Care Recommendations Oral care QID Other Recommendations --   CHL IP FOLLOW UP RECOMMENDATIONS 11/14/2020 Follow up Recommendations 24 hour supervision/assistance;Skilled Nursing facility;Inpatient Rehab   CHL IP FREQUENCY AND DURATION 11/14/2020 Speech Therapy Frequency (ACUTE ONLY) min 2x/week Treatment Duration 1 week      CHL IP ORAL PHASE 11/14/2020 Oral Phase -- Oral - Pudding Teaspoon -- Oral - Pudding Cup -- Oral - Honey Teaspoon -- Oral - Honey Cup -- Oral - Nectar Teaspoon Premature spillage;Left anterior bolus loss;Right anterior bolus loss;Weak lingual manipulation;Delayed oral transit Oral - Nectar Cup Delayed oral transit;Weak lingual manipulation;Right anterior bolus loss;Left anterior bolus loss;Premature spillage Oral - Nectar Straw -- Oral - Thin Teaspoon Premature spillage;Weak lingual manipulation;Left anterior bolus loss;Right anterior bolus loss;Delayed oral transit Oral - Thin Cup -- Oral - Thin Straw -- Oral - Puree Weak lingual manipulation;Delayed oral transit;Premature spillage Oral - Mech Soft -- Oral - Regular -- Oral - Multi-Consistency -- Oral - Pill -- Oral Phase - Comment --  CHL IP PHARYNGEAL PHASE 11/14/2020 Pharyngeal Phase -- Pharyngeal- Pudding Teaspoon -- Pharyngeal -- Pharyngeal- Pudding Cup -- Pharyngeal -- Pharyngeal- Honey Teaspoon -- Pharyngeal -- Pharyngeal- Honey Cup -- Pharyngeal -- Pharyngeal- Nectar Teaspoon Delayed swallow initiation-vallecula;Pharyngeal residue - valleculae Pharyngeal -- Pharyngeal- Nectar Cup Delayed swallow initiation-vallecula;Pharyngeal residue - valleculae Pharyngeal -- Pharyngeal-  Nectar Straw -- Pharyngeal -- Pharyngeal- Thin Teaspoon Delayed swallow initiation-vallecula;Pharyngeal residue - valleculae Pharyngeal -- Pharyngeal- Thin Cup Pharyngeal residue - valleculae;Delayed swallow initiation-vallecula;Reduced tongue base retraction Pharyngeal -- Pharyngeal- Thin Straw -- Pharyngeal -- Pharyngeal- Puree Pharyngeal residue - valleculae;Delayed swallow initiation-vallecula;Reduced tongue base retraction Pharyngeal -- Pharyngeal- Mechanical Soft -- Pharyngeal -- Pharyngeal- Regular -- Pharyngeal -- Pharyngeal- Multi-consistency -- Pharyngeal -- Pharyngeal- Pill -- Pharyngeal -- Pharyngeal Comment --  CHL IP CERVICAL ESOPHAGEAL PHASE 11/14/2020 Cervical Esophageal Phase WFL Pudding Teaspoon -- Pudding Cup -- Honey Teaspoon -- Honey Cup -- Nectar Teaspoon -- Nectar Cup -- Nectar Straw -- Thin Teaspoon -- Thin Cup -- Thin Straw -- Puree -- Mechanical Soft -- Regular -- Multi-consistency -- Pill -- Cervical Esophageal Comment -- Sonia Baller, MA, CCC-SLP Speech Therapy [facility] Acute Rehab             ECHOCARDIOGRAM LIMITED  Result Date: 11/13/2020    ECHOCARDIOGRAM LIMITED REPORT   Patient Name:   Travis SHIVES  [name] Date of Exam: 11/13/2020 Medical Rec #:  [id number]         Height:       71.0 in Accession #:    [id number]        Weight:       183.9 lb Date of Birth:  04-07-1961        BSA:          2.035 m Patient Age:    59 years          BP:           112/68 mmHg Patient Gender: M                 HR:           91 bpm. 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 CHF, Previous Myocardial Infarction and CAD,                 Arrythmias:Atrial Fibrillation; Risk Factors:Hypertension and                 Dyslipidemia. CKD.  Sonographer:    Clayton Lefort RDCS (AE) Referring Phys: [place]   Sonographer Comments: Limited patient mobility. IMPRESSIONS  1. Left ventricular ejection fraction, by estimation, is 45 to 50%. The left ventricle has mildly decreased function. The left ventricle demonstrates regional wall motion abnormalities (see scoring diagram/findings for description). There is severe concentric left ventricular hypertrophy. Left ventricular diastolic parameters are consistent with Grade I diastolic dysfunction (impaired relaxation).  2. Right ventricular systolic function is normal. The right ventricular size is normal. There is normal pulmonary artery systolic pressure. The estimated right ventricular systolic pressure is 09.3 mmHg.  3. The mitral valve is degenerative. Mild mitral valve regurgitation. No evidence of mitral stenosis.  4. The aortic valve is tricuspid. There is mild calcification of the aortic valve. There is mild thickening of the aortic valve. Aortic valve regurgitation is mild. Mild aortic valve sclerosis is present, with no evidence of aortic valve stenosis.  5. The inferior vena cava is normal in size with greater than 50% respiratory variability, suggesting right atrial pressure of 3 mmHg. Comparison(s): Changes from prior study are noted. EF has improved to 45-50%. WMAs remain the mid septum into apex. RVSP has improved. FINDINGS  Left Ventricle: Left ventricular ejection fraction, by estimation, is 45 to 50%. The left ventricle has mildly decreased function. The left ventricle demonstrates regional wall motion abnormalities. The left ventricular internal cavity size was normal in size. There is severe concentric left ventricular hypertrophy. Left ventricular diastolic parameters are consistent with Grade I diastolic dysfunction (impaired relaxation).  LV Wall Scoring: The mid and distal anterior septum, mid inferoseptal segment, and apex are hypokinetic. Right Ventricle: The right ventricular size is normal. No increase in right ventricular wall thickness. Right  ventricular systolic function is normal. There is normal pulmonary artery systolic pressure. The tricuspid regurgitant velocity is 1.53 m/s, and  with an assumed right atrial pressure of 3 mmHg, the estimated right ventricular systolic pressure is 26.7 mmHg. Pericardium: Trivial pericardial effusion is present. Presence of pericardial fat pad. Mitral Valve: The mitral valve is degenerative in appearance. Mild mitral annular calcification. Mild mitral valve  regurgitation. No evidence of mitral valve stenosis. Tricuspid Valve: The tricuspid valve is grossly normal. Tricuspid valve regurgitation is trivial. No evidence of tricuspid stenosis. Aortic Valve: The aortic valve is tricuspid. There is mild calcification of the aortic valve. There is mild thickening of the aortic valve. Aortic valve regurgitation is mild. Mild aortic valve sclerosis is present, with no evidence of aortic valve stenosis. Pulmonic Valve: The pulmonic valve was grossly normal. Pulmonic valve regurgitation is not visualized. No evidence of pulmonic stenosis. Venous: The inferior vena cava is normal in size with greater than 50% respiratory variability, suggesting right atrial pressure of 3 mmHg. LEFT VENTRICLE PLAX 2D LVIDd:         4.80 cm      Diastology LVIDs:         3.30 cm      LV e' medial:    5.22 cm/s LV PW:         1.81 cm      LV E/e' medial:  15.8 LV IVS:        1.78 cm      LV e' lateral:   7.72 cm/s LVOT diam:     2.30 cm      LV E/e' lateral: 10.7 LVOT Area:     4.15 cm  LV Volumes (MOD) LV vol d, MOD A2C: 193.0 ml LV vol d, MOD A4C: 157.0 ml LV vol s, MOD A2C: 109.0 ml LV vol s, MOD A4C: 75.9 ml LV SV MOD A2C:     84.0 ml LV SV MOD A4C:     157.0 ml LV SV MOD BP:      88.0 ml IVC IVC diam: 1.40 cm LEFT ATRIUM         Index LA diam:    4.20 cm 2.06 cm/m   AORTA Ao Root diam: 4.00 cm MITRAL VALVE                TRICUSPID VALVE MV Area (PHT): 4.10 cm     TR Peak grad:   9.4 mmHg MV Decel Time: 185 msec     TR Vmax:        153.00  cm/s MR Peak grad: 75.0 mmHg MR Mean grad: 50.0 mmHg     SHUNTS MR Vmax:      433.00 cm/s   Systemic Diam: 2.30 cm MR Vmean:     332.0 cm/s MV E velocity: 82.50 cm/s MV A velocity: 135.00 cm/s MV E/A ratio:  0.61 Eleonore Chiquito MD Electronically signed by Eleonore Chiquito MD Signature Date/Time: 11/13/2020/6:05:32 PM    Final     Cardiac Studies     Patient Profile     59 y.o. male with ESRD on HD, HTN, tobacco abuse, CAD  presenting with anterior STEMI 10/28/20 s/p placement of a stent in the LAD with residual disease in the RCA and Circumflex. He developed atrial fib with RVR. Hospital course complicated by MSSA bacteremia with shock, upper GI bleeding secondary to severe esophagitis and duodenal ulcer, ICU delirium, metabolic encephalopathy, acute hypoxemic respiratory failure, anemia.   Assessment & Plan    1) CAD/Anterior STEMI: Now on Brilinta and statin. Plan for staged PCI of the RCA when other issues stable.    2) ESRD: Dialysis per Nephrology  3) Shock: Multi-factorial. BP stable off of Norepi.   4) Atrial fibrillation: He is in sinus today. Continue oral amiodarone. Not a candidate for anti-coagulation.    5) Ischemic cardiomyopathy: LVEF=35-40% by echo 11/01/20. Cannot add  goal directed medical therapy yet due to hypotension.    For questions or updates, please contact [place] Please consult [email or web address] for contact info under        Signed, Lauree Chandler, MD  11/15/2020, 8:25 AM

## 2020-11-15 NOTE — Progress Notes (Signed)
Reactivation due to melena and drop in hemoglobin  Subjective: Patient was seen and examined at bedside.  He denies abdominal pain.  He seems to be tolerating nasogastric feeds. He is not on IV pressors. Currently undergoing hemodialysis. He is to receive 2 units PRBC transfusion during dialysis. Flexi-Seal in place draining liquid dark stools.  Objective: Vital signs in last 24 hours: Temp:  [97.9 F (36.6 C)-99.9 F (37.7 C)] 98.7 F (37.1 C) (12/08 0900) Pulse Rate:  [83-113] 105 (12/08 0900) Resp:  [20-41] 29 (12/08 0900) BP: (93-166)/(57-144) 109/66 (12/08 0900) SpO2:  [90 %-97 %] 96 % (12/08 0900) Weight:  [85.4 kg-85.7 kg] 85.4 kg (12/08 0700) Weight change: 2.3 kg Last BM Date: 11/15/20  PE: Ill-appearing, able to speak in few words not full sentences GENERAL: Awake, oriented to time, place and person, slightly tachycardic ABDOMEN: Nondistended, nontender, normoactive bowel sounds EXTREMITIES:ulcer prevention boots in place  Lab Results: Results for orders placed or performed during the hospital encounter of 10/28/20 (from the past 48 hour(s))  Glucose, capillary     Status: None   Collection Time: 11/13/20 11:33 AM  Result Value Ref Range   Glucose-Capillary 73 70 - 99 mg/dL    Comment: Glucose reference range applies only to samples taken after fasting for at least 8 hours.  Glucose, capillary     Status: None   Collection Time: 11/13/20  3:35 PM  Result Value Ref Range   Glucose-Capillary 84 70 - 99 mg/dL    Comment: Glucose reference range applies only to samples taken after fasting for at least 8 hours.  Renal function panel (daily at 1600)     Status: Abnormal   Collection Time: 11/13/20  5:34 PM  Result Value Ref Range   Sodium 134 (L) 135 - 145 mmol/L   Potassium 3.8 3.5 - 5.1 mmol/L   Chloride 99 98 - 111 mmol/L   CO2 22 22 - 32 mmol/L   Glucose, Bld 126 (H) 70 - 99 mg/dL    Comment: Glucose reference range applies only to samples taken after fasting  for at least 8 hours.   BUN 45 (H) 6 - 20 mg/dL   Creatinine, Ser 5.66 (H) 0.61 - 1.24 mg/dL   Calcium 8.5 (L) 8.9 - 10.3 mg/dL   Phosphorus 4.3 2.5 - 4.6 mg/dL   Albumin 1.2 (L) 3.5 - 5.0 g/dL   GFR, Estimated 11 (L) >60 mL/min    Comment: (NOTE) Calculated using the CKD-EPI Creatinine Equation (2021)    Anion gap 13 5 - 15    Comment: Performed at [facility] [address], [place], [place] [address]  Glucose, capillary     Status: Abnormal   Collection Time: 11/13/20  7:47 PM  Result Value Ref Range   Glucose-Capillary 101 (H) 70 - 99 mg/dL    Comment: Glucose reference range applies only to samples taken after fasting for at least 8 hours.  Glucose, capillary     Status: Abnormal   Collection Time: 11/14/20 12:23 AM  Result Value Ref Range   Glucose-Capillary 117 (H) 70 - 99 mg/dL    Comment: Glucose reference range applies only to samples taken after fasting for at least 8 hours.  Glucose, capillary     Status: Abnormal   Collection Time: 11/14/20  4:09 AM  Result Value Ref Range   Glucose-Capillary 128 (H) 70 - 99 mg/dL    Comment: Glucose reference range applies only to samples taken after fasting for at least  8 hours.  CBC     Status: Abnormal   Collection Time: 11/14/20  4:13 AM  Result Value Ref Range   WBC 12.0 (H) 4.0 - 10.5 K/uL   RBC 2.60 (L) 4.22 - 5.81 MIL/uL   Hemoglobin 7.7 (L) 13.0 - 17.0 g/dL   HCT 24.5 (L) 39 - 52 %   MCV 94.2 80.0 - 100.0 fL   MCH 29.6 26.0 - 34.0 pg   MCHC 31.4 30.0 - 36.0 g/dL   RDW 17.6 (H) 11.5 - 15.5 %   Platelets 227 150 - 400 K/uL   nRBC 0.0 0.0 - 0.2 %    Comment: Performed at [facility] [address], [place], [place] [address]  Magnesium     Status: None   Collection Time: 11/14/20  4:13 AM  Result Value Ref Range   Magnesium 2.2 1.7 - 2.4 mg/dL    Comment: Performed at [facility] [address], [place], [place] [address]  Renal function panel     Status: Abnormal   Collection Time: 11/14/20   4:13 AM  Result Value Ref Range   Sodium 132 (L) 135 - 145 mmol/L   Potassium 3.9 3.5 - 5.1 mmol/L   Chloride 98 98 - 111 mmol/L   CO2 20 (L) 22 - 32 mmol/L   Glucose, Bld 122 (H) 70 - 99 mg/dL    Comment: Glucose reference range applies only to samples taken after fasting for at least 8 hours.   BUN 63 (H) 6 - 20 mg/dL   Creatinine, Ser 6.61 (H) 0.61 - 1.24 mg/dL   Calcium 8.8 (L) 8.9 - 10.3 mg/dL   Phosphorus 5.7 (H) 2.5 - 4.6 mg/dL   Albumin 1.1 (L) 3.5 - 5.0 g/dL   GFR, Estimated 9 (L) >60 mL/min    Comment: (NOTE) Calculated using the CKD-EPI Creatinine Equation (2021)    Anion gap 14 5 - 15    Comment: Performed at [facility] [address], [place], [place] [address]  Glucose, capillary     Status: Abnormal   Collection Time: 11/14/20  8:20 AM  Result Value Ref Range   Glucose-Capillary 114 (H) 70 - 99 mg/dL    Comment: Glucose reference range applies only to samples taken after fasting for at least 8 hours.  Glucose, capillary     Status: Abnormal   Collection Time: 11/14/20 11:55 AM  Result Value Ref Range   Glucose-Capillary 105 (H) 70 - 99 mg/dL    Comment: Glucose reference range applies only to samples taken after fasting for at least 8 hours.  CBC     Status: Abnormal   Collection Time: 11/14/20 12:18 PM  Result Value Ref Range   WBC 9.5 4.0 - 10.5 K/uL   RBC 2.88 (L) 4.22 - 5.81 MIL/uL   Hemoglobin 8.8 (L) 13.0 - 17.0 g/dL   HCT 26.4 (L) 39 - 52 %   MCV 91.7 80.0 - 100.0 fL   MCH 30.6 26.0 - 34.0 pg   MCHC 33.3 30.0 - 36.0 g/dL   RDW 17.4 (H) 11.5 - 15.5 %   Platelets 228 150 - 400 K/uL   nRBC 0.0 0.0 - 0.2 %    Comment: Performed at [facility] [address], [place], [place] [address]  Glucose, capillary     Status: None   Collection Time: 11/14/20  3:55 PM  Result Value Ref Range   Glucose-Capillary 92 70 - 99 mg/dL  Comment: Glucose reference range applies only to samples taken after fasting for at least 8 hours.  Glucose,  capillary     Status: Abnormal   Collection Time: 11/14/20  8:14 PM  Result Value Ref Range   Glucose-Capillary 105 (H) 70 - 99 mg/dL    Comment: Glucose reference range applies only to samples taken after fasting for at least 8 hours.  Glucose, capillary     Status: Abnormal   Collection Time: 11/15/20 12:52 AM  Result Value Ref Range   Glucose-Capillary 111 (H) 70 - 99 mg/dL    Comment: Glucose reference range applies only to samples taken after fasting for at least 8 hours.  Glucose, capillary     Status: Abnormal   Collection Time: 11/15/20  4:43 AM  Result Value Ref Range   Glucose-Capillary 109 (H) 70 - 99 mg/dL    Comment: Glucose reference range applies only to samples taken after fasting for at least 8 hours.  Renal function panel (daily at 0500)     Status: Abnormal   Collection Time: 11/15/20  5:08 AM  Result Value Ref Range   Sodium 130 (L) 135 - 145 mmol/L   Potassium 4.2 3.5 - 5.1 mmol/L   Chloride 98 98 - 111 mmol/L   CO2 19 (L) 22 - 32 mmol/L   Glucose, Bld 109 (H) 70 - 99 mg/dL    Comment: Glucose reference range applies only to samples taken after fasting for at least 8 hours.   BUN 102 (H) 6 - 20 mg/dL   Creatinine, Ser 8.06 (H) 0.61 - 1.24 mg/dL   Calcium 8.9 8.9 - 10.3 mg/dL   Phosphorus 5.9 (H) 2.5 - 4.6 mg/dL   Albumin 1.1 (L) 3.5 - 5.0 g/dL   GFR, Estimated 7 (L) >60 mL/min    Comment: (NOTE) Calculated using the CKD-EPI Creatinine Equation (2021)    Anion gap 13 5 - 15    Comment: Performed at [facility] [address] [place], [place] [address]  Magnesium     Status: None   Collection Time: 11/15/20  5:08 AM  Result Value Ref Range   Magnesium 2.2 1.7 - 2.4 mg/dL    Comment: Performed at [facility] [address] [place], [place] [address]  CBC     Status: Abnormal   Collection Time: 11/15/20  5:08 AM  Result Value Ref Range   WBC 8.3 4.0 - 10.5 K/uL   RBC 2.30 (L) 4.22 - 5.81 MIL/uL   Hemoglobin 7.0 (L) 13.0 - 17.0 g/dL    HCT 21.2 (L) 39 - 52 %   MCV 92.2 80.0 - 100.0 fL   MCH 30.4 26.0 - 34.0 pg   MCHC 33.0 30.0 - 36.0 g/dL   RDW 17.2 (H) 11.5 - 15.5 %   Platelets 206 150 - 400 K/uL   nRBC 0.0 0.0 - 0.2 %    Comment: Performed at [facility], [place] [address] [place], [place] [address]  Type and screen [place]     Status: None (Preliminary result)   Collection Time: 11/15/20  6:59 AM  Result Value Ref Range   ABO/RH(D) O POS    Antibody Screen NEG    Sample Expiration 11/18/2020,2359    Unit Number [id number]    Blood Component Type RED CELLS,LR    Unit division 00    Status of Unit ISSUED    Transfusion Status OK TO TRANSFUSE    Crossmatch  Result      Compatible Performed at [facility], [place] [address], [place], [place] [address]    Unit Number [id number]    Blood Component Type RED CELLS,LR    Unit division 00    Status of Unit ISSUED    Transfusion Status OK TO TRANSFUSE    Crossmatch Result Compatible   Glucose, capillary     Status: Abnormal   Collection Time: 11/15/20  8:06 AM  Result Value Ref Range   Glucose-Capillary 102 (H) 70 - 99 mg/dL    Comment: Glucose reference range applies only to samples taken after fasting for at least 8 hours.  Prepare RBC (crossmatch)     Status: None   Collection Time: 11/15/20  8:30 AM  Result Value Ref Range   Order Confirmation      ORDER PROCESSED BY BLOOD BANK Performed at [facility], [address] [place], [place] [address]     Studies/Results: DG Chest 1 View  Result Date: 11/14/2020 CLINICAL DATA:  Congestion, possible aspiration EXAM: CHEST  1 VIEW COMPARISON:  Portable exam 1601 hours compared to 11/08/2020 FINDINGS: Feeding tube extends into the stomach. RIGHT jugular line tip projects over RIGHT atrium unchanged. LEFT subclavian central venous catheter tip projects over LEFT brachiocephalic vein near SVC confluence unchanged. Enlargement of cardiac silhouette. Moderate partially  loculated RIGHT pleural effusion. Bibasilar consolidation versus atelectasis. Upper lungs clear. No pneumothorax. IMPRESSION: Partially loculated RIGHT pleural effusion. Enlargement of cardiac silhouette. Atelectasis versus consolidation of the lower lobes. Electronically Signed   By: Lavonia Dana M.D.   On: 11/14/2020 19:00   DG Swallowing Func-Speech Pathology  Result Date: 11/14/2020 Objective Swallowing Evaluation: Type of Study: MBS-Modified Barium Swallow Study  Patient Details Name: Travis [name] MRN: [id number] Date of Birth: 1961/03/01 Today's Date: 11/14/2020 Time: SLP Start Time (ACUTE ONLY): 1400 -SLP Stop Time (ACUTE ONLY): 1425 SLP Time Calculation (min) (ACUTE ONLY): 25 min Past Medical History: Past Medical History: Diagnosis Date . Acute ST elevation myocardial infarction (STEMI) of anterolateral wall ([place]) 10/28/2020  Culprit lesion, 99% ulcerated mid LAD at D1 -> DES PCI . Anemia of chronic disease 01/30/2015 . Coronary artery disease involving native coronary artery of native heart with unstable angina pectoris ([place]) 10/28/2020  Acute anterolateral STEMI: 3V CAD: Culprit Lesion = 99% mid LAD lesion (just after D1) - DES PCI; 99% ostial AV groove LCx; & 80% calcified napkin ring proximal RCA lesion with extensive calcification throughout the RCA. Successful PTCA and DES PCI of the LAD crossing D1 -resolute Onyx DES 2.75 mm x 18 mm postdilated to 3.1 mm. With PCI, there . End-stage renal disease on hemodialysis ([place])  . GERD (gastroesophageal reflux disease)   takes Nexium . Hyperlipidemia with target LDL less than 70 10/28/2020 . Hypertension  . Pneumonia   as a child . 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 Sleep apnea   uses c-pap 2 yrs Past Surgical History: Past Surgical History: Procedure Laterality Date . AV FISTULA PLACEMENT Left 12/22/2017  Procedure: REPAIR PSEUDOANEURYSM ARTERIOVENOUS  (AV) FISTULA;  Surgeon: [name], MD;  Location: [facility];  Service: Vascular;  Laterality: Left; . BASCILIC VEIN TRANSPOSITION Left 01/05/2014  Procedure: LEFT 1ST STAGE BASCILIC VEIN TRANSPOSITION;  Surgeon: Conrad [place], MD;  Location: [facility];  Service: Vascular;  Laterality: Left; . BASCILIC VEIN TRANSPOSITION Left 07/26/2014  Procedure: Left Arm Brachial Vein Transposition Second Stage;  Surgeon: Conrad Esparto, MD;  Location: [facility];  Service: Vascular;  Laterality: Left; . COLONOSCOPY   . COLONOSCOPY N/A 02/02/2015  Procedure: COLONOSCOPY;  Surgeon: [name], MD;  Location: [facility] ENDOSCOPY;  Service: Endoscopy;  Laterality: N/A; . CORONARY STENT INTERVENTION N/A 10/28/2020  Procedure: CORONARY STENT INTERVENTION;  Surgeon: Leonie Man, MD;  Location: [facility] CV LAB;  Service: Cardiovascular;  Laterality: N/A; . CORONARY/GRAFT ACUTE MI REVASCULARIZATION N/A 10/28/2020  Procedure: Coronary/Graft Acute MI Revascularization;  Surgeon: Leonie Man, MD;  Location: [facility] LAB;  Service: Cardiovascular;  Laterality: N/A; . ESOPHAGOGASTRODUODENOSCOPY Left 01/31/2015  Procedure: ESOPHAGOGASTRODUODENOSCOPY (EGD);  Surgeon: [name], MD;  Location: [facility] ENDOSCOPY;  Service: Endoscopy;  Laterality: Left; . ESOPHAGOGASTRODUODENOSCOPY N/A 11/09/2020  Procedure: ESOPHAGOGASTRODUODENOSCOPY (EGD);  Surgeon: Clarene Essex, MD;  Location: [facility];  Service: Endoscopy;  Laterality: N/A; . EVALUATION UNDER ANESTHESIA WITH HEMORRHOIDECTOMY N/A 02/26/2018  Procedure: ANORECTAL EXAM UNDER ANESTHESIA  HEMORRHOIDECTOMY x 2  HEMORRHOIDAL LIGATION AND PEXY;  Surgeon: Kathryn [place], MD;  Location: [facility];  Service: General;  Laterality: N/A; . GIVENS CAPSULE STUDY N/A 01/31/2015  Procedure: GIVENS CAPSULE STUDY;  Surgeon: [name], MD;  Location: [facility] ENDOSCOPY;  Service: Endoscopy;  Laterality: N/A; . LEFT HEART CATH AND CORONARY ANGIOGRAPHY N/A 10/28/2020  Procedure: LEFT HEART CATH AND CORONARY ANGIOGRAPHY;   Surgeon: Leonie Man, MD;  Location: [facility] CV LAB;  Service: Cardiovascular;  Laterality: N/A; . MOUTH SURGERY    teeth cleaning . 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 He describes his chest tightness, 7/10, with radiation to his back (between his scapula), he has not exerted. EKG in ED is suggestive of anterolateral ST elevations. In the ED he was given thorazine for hiccups; CXR 10/31/20 indicated New patchy right mid and lower lung infiltrate; CT chest 10/31/20 yielded results including: Extensive multifocal nodular and patchy airspace disease in both lungs with a slight peripheral predominance in the upper lungs. 3.1 cm nodular consolidative opacity in the right upper lobe is cavitated. Imaging features likely related to multifocal pneumonia. Pt intubated 11/23-12/3/21; gastroenterology consult 11/09/20 with UGI completed and results as follows: Moderately severe reflux and erosive esophagitis  with no bleeding; Hematin (altered blood/coffee-ground-like material) in the gastric antrum, in the cardia, in  the gastric fundus and in the gastric body. Fluid aspiration performed. Probable ischemic duodenitis. Normal third portion of the duodenum. The examination was otherwise normal. BSE generated.  Subjective: patient awake and alert but did not attempt to verbalize, was having difficulty sitting up and was unable to keep head up during study Assessment / Plan / Recommendation CHL IP CLINICAL IMPRESSIONS 11/14/2020 Clinical Impression Patient presents with a moderate oropharyngeal dysphagia with likely impact from current cognitive status and pain leading to difficulty maintaining adequate posture and participation during MBS. Patient kept head in a downward, almost chin tuck posture  and was not able to raise head despite tactile and physical cues. Oral delay in transit led to premature spillage into vallecular sinus, and swallow initiation occuring at level of vallecular sinus. With puree bolus, patient exhibited more significant delay in swallow initiation and had mild-mod vallecular residuals post initial swallow. No penetration or aspiration observed and no significant amount  of pyriform sinus residuals was observed. UES opening appeared [facility] and without evidence of retrograde movement of boluses. Currently, patient is not safe for full PO's, but this is largely due to his inability to maintain adequate participation combined with his overall weakness. Recommendation is remain NPO but allow for SLP to assess patient toleration of trials of thin liquids, nectar thick liquids, puree solids at bedside. SLP Visit Diagnosis Dysphagia, oropharyngeal phase (R13.12) Attention and concentration deficit following -- Frontal lobe and executive function deficit following -- Impact on safety and function Risk for inadequate nutrition/hydration;Mild aspiration risk;Moderate aspiration risk   CHL IP TREATMENT RECOMMENDATION 11/14/2020 Treatment Recommendations Therapy as outlined in treatment plan below   Prognosis 11/14/2020 Prognosis for Safe Diet Advancement Good Barriers to Reach Goals -- Barriers/Prognosis Comment -- CHL IP DIET RECOMMENDATION 11/14/2020 SLP Diet Recommendations NPO;Other (Comment) Liquid Administration via -- Medication Administration Via alternative means Compensations -- Postural Changes --   CHL IP OTHER RECOMMENDATIONS 11/14/2020 Recommended Consults -- Oral Care Recommendations Oral care QID Other Recommendations --   CHL IP FOLLOW UP RECOMMENDATIONS 11/14/2020 Follow up Recommendations 24 hour supervision/assistance;Skilled Nursing facility;Inpatient Rehab   CHL IP FREQUENCY AND DURATION 11/14/2020 Speech Therapy Frequency (ACUTE ONLY) min 2x/week Treatment Duration 1 week      CHL IP  ORAL PHASE 11/14/2020 Oral Phase -- Oral - Pudding Teaspoon -- Oral - Pudding Cup -- Oral - Honey Teaspoon -- Oral - Honey Cup -- Oral - Nectar Teaspoon Premature spillage;Left anterior bolus loss;Right anterior bolus loss;Weak lingual manipulation;Delayed oral transit Oral - Nectar Cup Delayed oral transit;Weak lingual manipulation;Right anterior bolus loss;Left anterior bolus loss;Premature spillage Oral - Nectar Straw -- Oral - Thin Teaspoon Premature spillage;Weak lingual manipulation;Left anterior bolus loss;Right anterior bolus loss;Delayed oral transit Oral - Thin Cup -- Oral - Thin Straw -- Oral - Puree Weak lingual manipulation;Delayed oral transit;Premature spillage Oral - Mech Soft -- Oral - Regular -- Oral - Multi-Consistency -- Oral - Pill -- Oral Phase - Comment --  CHL IP PHARYNGEAL PHASE 11/14/2020 Pharyngeal Phase -- Pharyngeal- Pudding Teaspoon -- Pharyngeal -- Pharyngeal- Pudding Cup -- Pharyngeal -- Pharyngeal- Honey Teaspoon -- Pharyngeal -- Pharyngeal- Honey Cup -- Pharyngeal -- Pharyngeal- Nectar Teaspoon Delayed swallow initiation-vallecula;Pharyngeal residue - valleculae Pharyngeal -- Pharyngeal- Nectar Cup Delayed swallow initiation-vallecula;Pharyngeal residue - valleculae Pharyngeal -- Pharyngeal- Nectar Straw -- Pharyngeal -- Pharyngeal- Thin Teaspoon Delayed swallow initiation-vallecula;Pharyngeal residue - valleculae Pharyngeal -- Pharyngeal- Thin Cup Pharyngeal residue - valleculae;Delayed swallow initiation-vallecula;Reduced tongue base retraction Pharyngeal -- Pharyngeal- Thin Straw -- Pharyngeal -- Pharyngeal- Puree Pharyngeal residue - valleculae;Delayed swallow initiation-vallecula;Reduced tongue base retraction Pharyngeal -- Pharyngeal- Mechanical Soft -- Pharyngeal -- Pharyngeal- Regular -- Pharyngeal -- Pharyngeal- Multi-consistency -- Pharyngeal -- Pharyngeal- Pill -- Pharyngeal -- Pharyngeal Comment --  CHL IP CERVICAL ESOPHAGEAL PHASE 11/14/2020 Cervical Esophageal Phase WFL  Pudding Teaspoon -- Pudding Cup -- Honey Teaspoon -- Honey Cup -- Nectar Teaspoon -- Nectar Cup -- Nectar Straw -- Thin Teaspoon -- Thin Cup -- Thin Straw -- Puree -- Mechanical Soft -- Regular -- Multi-consistency -- Pill -- Cervical Esophageal Comment -- Sonia Baller, MA, CCC-SLP Speech Therapy [facility] Acute Rehab             ECHOCARDIOGRAM LIMITED  Result Date: 11/13/2020    ECHOCARDIOGRAM LIMITED REPORT   Patient Name:   Travis [name] Date of Exam: 11/13/2020 Medical Rec #:  [id number]         Height:       71.0 in Accession #:  [id number]        Weight:       183.9 lb Date of Birth:  [birth year]        BSA:          2.035 m Patient Age:    10 years          BP:           112/68 mmHg Patient Gender: M                 HR:           91 bpm. 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 CHF, Previous Myocardial Infarction and CAD,                 Arrythmias:Atrial Fibrillation; Risk Factors:Hypertension and                 Dyslipidemia. CKD.  Sonographer:    Clayton Lefort RDCS (AE) Referring Phys: [place]  Sonographer Comments: Limited patient mobility. IMPRESSIONS  1. Left ventricular ejection fraction, by estimation, is 45 to 50%. The left ventricle has mildly decreased function. The left ventricle demonstrates regional wall motion abnormalities (see scoring diagram/findings for description). There is severe concentric left ventricular hypertrophy. Left ventricular diastolic parameters are consistent with Grade I diastolic dysfunction (impaired relaxation).  2. Right ventricular systolic function is normal. The right ventricular size is normal. There is normal pulmonary artery systolic pressure. The estimated right ventricular systolic pressure is 42.6 mmHg.  3. The mitral valve is  degenerative. Mild mitral valve regurgitation. No evidence of mitral stenosis.  4. The aortic valve is tricuspid. There is mild calcification of the aortic valve. There is mild thickening of the aortic valve. Aortic valve regurgitation is mild. Mild aortic valve sclerosis is present, with no evidence of aortic valve stenosis.  5. The inferior vena cava is normal in size with greater than 50% respiratory variability, suggesting right atrial pressure of 3 mmHg. Comparison(s): Changes from prior study are noted. EF has improved to 45-50%. WMAs remain the mid septum into apex. RVSP has improved. FINDINGS  Left Ventricle: Left ventricular ejection fraction, by estimation, is 45 to 50%. The left ventricle has mildly decreased function. The left ventricle demonstrates regional wall motion abnormalities. The left ventricular internal cavity size was normal in size. There is severe concentric left ventricular hypertrophy. Left ventricular diastolic parameters are consistent with Grade I diastolic dysfunction (impaired relaxation).  LV Wall Scoring: The mid and distal anterior septum, mid inferoseptal segment, and apex are hypokinetic. Right Ventricle: The right ventricular size is normal. No increase in right ventricular wall thickness. Right ventricular systolic function is normal. There is normal pulmonary artery systolic pressure. The tricuspid regurgitant velocity is 1.53 m/s, and  with an assumed right atrial pressure of 3 mmHg, the estimated right ventricular systolic pressure is 83.4 mmHg. Pericardium: Trivial pericardial effusion is present. Presence of pericardial fat pad. Mitral Valve: The mitral valve is degenerative in appearance. Mild mitral annular calcification. Mild mitral valve regurgitation. No evidence of mitral valve stenosis. Tricuspid Valve: The tricuspid valve is grossly normal. Tricuspid valve regurgitation is trivial. No evidence of tricuspid stenosis. Aortic Valve: The aortic valve is tricuspid.  There is mild calcification of the aortic valve. There is mild thickening of the aortic valve. Aortic valve regurgitation is mild. Mild aortic valve sclerosis is present, with no evidence of aortic valve stenosis. Pulmonic Valve: The pulmonic valve was grossly normal. Pulmonic valve regurgitation is not visualized. No evidence of pulmonic stenosis. Venous: The inferior vena cava is normal in size with greater than 50% respiratory variability, suggesting right atrial pressure of 3 mmHg. LEFT VENTRICLE PLAX 2D LVIDd:         4.80 cm      Diastology LVIDs:         3.30 cm      LV e' medial:    5.22 cm/s LV PW:         1.81 cm      LV E/e' medial:  15.8 LV IVS:        1.78 cm      LV e' lateral:   7.72 cm/s LVOT diam:     2.30 cm      LV E/e' lateral: 10.7 LVOT Area:     4.15 cm  LV Volumes (MOD) LV vol d, MOD A2C: 193.0 ml LV vol d, MOD A4C: 157.0 ml LV vol s, MOD A2C: 109.0 ml LV vol s, MOD A4C: 75.9 ml LV SV MOD A2C:     84.0 ml LV SV MOD A4C:     157.0 ml LV SV MOD BP:      88.0 ml IVC IVC diam: 1.40 cm LEFT ATRIUM         Index LA diam:    4.20 cm 2.06 cm/m   AORTA Ao Root diam: 4.00 cm MITRAL VALVE                TRICUSPID VALVE MV Area (PHT): 4.10 cm     TR Peak grad:   9.4 mmHg MV Decel Time: 185 msec     TR Vmax:        153.00 cm/s MR Peak grad: 75.0 mmHg MR Mean grad: 50.0 mmHg     SHUNTS MR Vmax:      433.00 cm/s   Systemic Diam: 2.30 cm MR Vmean:     332.0 cm/s MV E velocity: 82.50 cm/s MV A velocity: 135.00 cm/s MV E/A ratio:  0.61 Eleonore Chiquito MD Electronically signed by Eleonore Chiquito MD Signature Date/Time: 11/13/2020/6:05:32 PM    Final     Medications: I have reviewed the patient's current medications.  Assessment: Severe esophagitis and duodenal ulcers compatible with ischemia noted on EGD from 11/09/2020 Continued drop in hemoglobin with liquid black stools suspicious for ongoing upper GI blood loss  PEA, status post brief CPR NSTEMI, status post LAD PCI on 10/28/2020, on Brilinta and  heparin  A. fib with RVR, status post cardioversion on 10/29/2020, on amiodarone Bacteremia-MSSA End-stage renal disease on hemodialysis Status post extubation on 11/10/2020 Ischemic cardiomyopathy, LVEF 35 to 40%  Recent constipation-was on MiraLAX and senna  Plan: Currently on nasogastric feeds Will DC Dulcolax suppository, Colace, MiraLAX and senna CT abdomen and pelvis has been ordered without contrast, will follow results. Change Protonix from 40 mg IV twice daily to Protonix 8 mg/h IV to be given continuously at least for the next 72 hours. We will start sucralfate 1 g / 10 mL suspension 4 times a day. 2 units PRBC transfusion ordered, will follow H&H. Depending upon CAT scan, hemodynamics, response to high-dose PPI and sucralfate, will decide if repeat endoscopy is needed.  Ronnette Juniper,  MD 11/15/2020, 9:24 AM

## 2020-11-15 NOTE — Progress Notes (Signed)
Pt. Received 2 units PRBC without complication during HD 1 hr and 30 minutes  tx time remaining system clotted unable to return arterial or venous blood. Upon disconnection noted cots pulling from both arterial and venous needles. Machine stopped several times during treatment due to constant repositioning and pt. Bending arm. Dr. Carolin Sicks aware. Orders to complete tx using HD catheter. Pt. stable

## 2020-11-15 NOTE — Progress Notes (Signed)
HD tx resumed 

## 2020-11-15 NOTE — Plan of Care (Signed)
  Problem: Activity: Goal: Ability to return to baseline activity level will improve Outcome: Progressing   Problem: Cardiovascular: Goal: Ability to achieve and maintain adequate cardiovascular perfusion will improve Outcome: Progressing   Problem: Clinical Measurements: Goal: Ability to maintain clinical measurements within normal limits will improve Outcome: Progressing Goal: Will remain free from infection Outcome: Progressing Goal: Diagnostic test results will improve Outcome: Progressing Goal: Respiratory complications will improve Outcome: Progressing Goal: Cardiovascular complication will be avoided Outcome: Progressing   Problem: Nutrition: Goal: Adequate nutrition will be maintained Outcome: Progressing   Problem: Coping: Goal: Level of anxiety will decrease Outcome: Progressing   Problem: Pain Managment: Goal: General experience of comfort will improve Outcome: Progressing   Problem: Skin Integrity: Goal: Risk for impaired skin integrity will decrease Outcome: Progressing   Problem: Safety: Goal: Ability to remain free from injury will improve Outcome: Progressing   Problem: Respiratory: Goal: Ability to maintain a clear airway and adequate ventilation will improve Outcome: Progressing

## 2020-11-15 NOTE — Progress Notes (Signed)
Physical Therapy Wound Treatment Patient Details  Name: Travis [name] MRN: [id number] Date of Birth: [birth year]  Today's Date: 11/15/2020 Time: 1040-1120 Time Calculation (min): 40 min  Subjective  Subjective: Agreeable to hydrotherapy Patient and Family Stated Goals: None stated Prior Treatments: Dressing changes  Pain Score: Pt tolerated well with no complaints of pain throughout session.   Wound Assessment  Dressing Type ABD;Barrier Film (skin prep);Gauze (Comment);Moist to dry 11/15/20 1413  Dressing Changed;Clean;Dry;Intact 11/15/20 1413  Dressing Change Frequency Daily 11/15/20 1413  State of Healing Eschar 11/15/20 1413  Site / Wound Assessment Black;Red;Brown 11/15/20 1413  % Wound base Red or Granulating 50% 11/15/20 1413  % Wound base Yellow/Fibrinous Exudate 15% 11/15/20 1413  % Wound base Black/Eschar 35% 11/15/20 1413  % Wound base Other/Granulation Tissue (Comment) 0% 11/15/20 1413  Peri-wound Assessment Intact 11/15/20 1413  Wound Length (cm) 11 cm 11/14/20 1100  Wound Width (cm) 17 cm 11/14/20 1100  Wound Depth (cm) 0.1 cm 11/14/20 1100  Wound Surface Area (cm^2) 187 cm^2 11/14/20 1100  Wound Volume (cm^3) 18.7 cm^3 11/14/20 1100  Margins Unattached edges (unapproximated) 11/15/20 1413  Drainage Amount Minimal 11/15/20 1413  Drainage Description Serosanguineous 11/15/20 1413  Treatment Debridement (Selective);Hydrotherapy (Pulse lavage);Packing (Saline gauze) 11/15/20 1413   Santyl applied to wound bed prior to applying dressing.     Hydrotherapy Pulsed lavage therapy - wound location: Buttocks Pulsed Lavage with Suction (psi): 12 psi Pulsed Lavage with Suction - Normal Saline Used: 1000 mL Pulsed Lavage Tip: Tip with splash shield Selective Debridement Selective Debridement - Location: Buttocks Selective Debridement - Tools Used: Forceps;Scalpel;Scissors Selective Debridement - Tissue Removed: Eschar   Wound Assessment and Plan  Wound Therapy -  Assess/Plan/Recommendations Wound Therapy - Clinical Statement: Progressing with debridement, This patient will benefit from continued hydrotherapy for selective removal of unviable tissue, to decrease bioburden, and promote wound bed healing.  Wound Therapy - Functional Problem List: Global weakness s/p ICU stay Factors Delaying/Impairing Wound Healing: Immobility;Multiple medical problems Hydrotherapy Plan: Debridement;Dressing change;Patient/family education;Pulsatile lavage with suction Wound Therapy - Frequency: 6X / week Wound Therapy - Follow Up Recommendations: Skilled nursing facility Wound Plan: See above  Wound Therapy Goals- Improve the function of patient's integumentary system by progressing the wound(s) through the phases of wound healing (inflammation - proliferation - remodeling) by: Decrease Necrotic Tissue to: 20% Decrease Necrotic Tissue - Progress: Progressing toward goal Increase Granulation Tissue to: 80% Increase Granulation Tissue - Progress: Progressing toward goal Goals/treatment plan/discharge plan were made with and agreed upon by patient/family: Yes Time For Goal Achievement: 7 days Wound Therapy - Potential for Goals: Good  Goals will be updated until maximal potential achieved or discharge criteria met.  Discharge criteria: when goals achieved, discharge from hospital, MD decision/surgical intervention, no progress towards goals, refusal/missing three consecutive treatments without notification or medical reason.  GP     Thelma Comp 11/15/2020, 2:17 PM   Rolinda Roan, PT, DPT Acute Rehabilitation Services Pager: [phone] Office: [phone]

## 2020-11-16 ENCOUNTER — Inpatient Hospital Stay (HOSPITAL_COMMUNITY): Payer: Medicare Other

## 2020-11-16 DIAGNOSIS — J90 Pleural effusion, not elsewhere classified: Secondary | ICD-10-CM

## 2020-11-16 DIAGNOSIS — N186 End stage renal disease: Secondary | ICD-10-CM | POA: Diagnosis not present

## 2020-11-16 DIAGNOSIS — T82590A Other mechanical complication of surgically created arteriovenous fistula, initial encounter: Secondary | ICD-10-CM | POA: Diagnosis not present

## 2020-11-16 LAB — POCT I-STAT EG7
Acid-Base Excess: 1 mmol/L (ref 0.0–2.0)
Bicarbonate: 25.5 mmol/L (ref 20.0–28.0)
Calcium, Ion: 1.35 mmol/L (ref 1.15–1.40)
HCT: 25 % — ABNORMAL LOW (ref 39.0–52.0)
Hemoglobin: 8.5 g/dL — ABNORMAL LOW (ref 13.0–17.0)
O2 Saturation: 65 %
Patient temperature: 37.8
Potassium: 4.3 mmol/L (ref 3.5–5.1)
Sodium: 133 mmol/L — ABNORMAL LOW (ref 135–145)
TCO2: 27 mmol/L (ref 22–32)
pCO2, Ven: 37.9 mmHg — ABNORMAL LOW (ref 44.0–60.0)
pH, Ven: 7.439 — ABNORMAL HIGH (ref 7.250–7.430)
pO2, Ven: 34 mmHg (ref 32.0–45.0)

## 2020-11-16 LAB — BODY FLUID CELL COUNT WITH DIFFERENTIAL
Eos, Fluid: 0 %
Lymphs, Fluid: 40 %
Monocyte-Macrophage-Serous Fluid: 15 % — ABNORMAL LOW (ref 50–90)
Neutrophil Count, Fluid: 44 % — ABNORMAL HIGH (ref 0–25)
Other Cells, Fluid: 1 %
Total Nucleated Cell Count, Fluid: 356 cu mm (ref 0–1000)

## 2020-11-16 LAB — RENAL FUNCTION PANEL
Albumin: 1.2 g/dL — ABNORMAL LOW (ref 3.5–5.0)
Anion gap: 12 (ref 5–15)
BUN: 79 mg/dL — ABNORMAL HIGH (ref 6–20)
CO2: 24 mmol/L (ref 22–32)
Calcium: 9.1 mg/dL (ref 8.9–10.3)
Chloride: 97 mmol/L — ABNORMAL LOW (ref 98–111)
Creatinine, Ser: 6.91 mg/dL — ABNORMAL HIGH (ref 0.61–1.24)
GFR, Estimated: 9 mL/min — ABNORMAL LOW (ref >60)
Glucose, Bld: 121 mg/dL — ABNORMAL HIGH (ref 70–99)
Phosphorus: 5.4 mg/dL — ABNORMAL HIGH (ref 2.5–4.6)
Potassium: 3.9 mmol/L (ref 3.5–5.1)
Sodium: 133 mmol/L — ABNORMAL LOW (ref 135–145)

## 2020-11-16 LAB — CBC
HCT: 23.3 % — ABNORMAL LOW (ref 39.0–52.0)
Hemoglobin: 7.4 g/dL — ABNORMAL LOW (ref 13.0–17.0)
MCH: 29 pg (ref 26.0–34.0)
MCHC: 31.8 g/dL (ref 30.0–36.0)
MCV: 91.4 fL (ref 80.0–100.0)
Platelets: 165 10*3/uL (ref 150–400)
RBC: 2.55 MIL/uL — ABNORMAL LOW (ref 4.22–5.81)
RDW: 16.5 % — ABNORMAL HIGH (ref 11.5–15.5)
WBC: 5.9 10*3/uL (ref 4.0–10.5)
nRBC: 0 % (ref 0.0–0.2)

## 2020-11-16 LAB — GLUCOSE, CAPILLARY
Glucose-Capillary: 103 mg/dL — ABNORMAL HIGH (ref 70–99)
Glucose-Capillary: 113 mg/dL — ABNORMAL HIGH (ref 70–99)
Glucose-Capillary: 89 mg/dL (ref 70–99)
Glucose-Capillary: 89 mg/dL (ref 70–99)
Glucose-Capillary: 91 mg/dL (ref 70–99)
Glucose-Capillary: 94 mg/dL (ref 70–99)

## 2020-11-16 LAB — BLOOD GAS, VENOUS
Acid-base deficit: 0.3 mmol/L (ref 0.0–2.0)
Bicarbonate: 24.2 mmol/L (ref 20.0–28.0)
Drawn by: 59506
FIO2: 52
O2 Saturation: 84.8 %
Patient temperature: 37.3
pCO2, Ven: 43.3 mmHg — ABNORMAL LOW (ref 44.0–60.0)
pH, Ven: 7.368 (ref 7.250–7.430)
pO2, Ven: 53.3 mmHg — ABNORMAL HIGH (ref 32.0–45.0)

## 2020-11-16 LAB — HEMATOCRIT: HCT: 27.2 % — ABNORMAL LOW (ref 39.0–52.0)

## 2020-11-16 LAB — PREPARE RBC (CROSSMATCH)

## 2020-11-16 LAB — LACTATE DEHYDROGENASE, PLEURAL OR PERITONEAL FLUID: LD, Fluid: 393 U/L — ABNORMAL HIGH (ref 3–23)

## 2020-11-16 LAB — MAGNESIUM: Magnesium: 2 mg/dL (ref 1.7–2.4)

## 2020-11-16 LAB — PROTEIN, TOTAL: Total Protein: 5.4 g/dL — ABNORMAL LOW (ref 6.5–8.1)

## 2020-11-16 LAB — LACTATE DEHYDROGENASE: LDH: 179 U/L (ref 98–192)

## 2020-11-16 LAB — PROTEIN, PLEURAL OR PERITONEAL FLUID: Total protein, fluid: 3 g/dL

## 2020-11-16 MED ORDER — CHLORHEXIDINE GLUCONATE CLOTH 2 % EX PADS
6.0000 | MEDICATED_PAD | Freq: Every day | CUTANEOUS | Status: DC
  Administered 2020-11-16: 6 via TOPICAL

## 2020-11-16 MED ORDER — OXYCODONE HCL 5 MG PO TABS
5.0000 mg | ORAL_TABLET | Freq: Four times a day (QID) | ORAL | Status: DC | PRN
  Administered 2020-11-16 (×2): 5 mg via ORAL
  Filled 2020-11-16 (×2): qty 1

## 2020-11-16 MED ORDER — OXYCODONE HCL 5 MG PO TABS
10.0000 mg | ORAL_TABLET | Freq: Four times a day (QID) | ORAL | Status: DC | PRN
  Administered 2020-11-17 – 2020-11-23 (×11): 10 mg
  Filled 2020-11-16 (×12): qty 2

## 2020-11-16 MED ORDER — SODIUM CHLORIDE 0.9% IV SOLUTION: Freq: Once | INTRAVENOUS | Status: AC

## 2020-11-16 MED ORDER — SODIUM CHLORIDE 0.9% FLUSH
10.0000 mL | Freq: Three times a day (TID) | INTRAVENOUS | Status: DC
  Administered 2020-11-16 – 2020-11-24 (×19): 10 mL

## 2020-11-16 MED ORDER — RESOURCE THICKENUP CLEAR PO POWD
ORAL | Status: DC | PRN
  Filled 2020-11-16: qty 125

## 2020-11-16 MED ORDER — JUVEN PO PACK
1.0000 | PACK | Freq: Two times a day (BID) | ORAL | Status: DC
  Administered 2020-11-17 – 2020-11-18 (×3): 1
  Filled 2020-11-16 (×3): qty 1

## 2020-11-16 MED ORDER — RENA-VITE PO TABS
1.0000 | ORAL_TABLET | Freq: Every day | ORAL | Status: DC
  Administered 2020-11-16 – 2020-11-17 (×2): 1 via ORAL
  Filled 2020-11-16 (×2): qty 1

## 2020-11-16 NOTE — Progress Notes (Signed)
Subjective: Patient continues to have liquid dark stool in Flexi-Seal bag. He is on NG tube feeding at 55 ml/hr. Denies abdominal pain.  Objective: Vital signs in last 24 hours: Temp:  [97.9 F (36.6 C)-100.5 F (38.1 C)] 100 F (37.8 C) (12/09 0759) Pulse Rate:  [94-115] 107 (12/09 0900) Resp:  [19-39] 19 (12/09 0900) BP: (96-132)/(57-85) 126/85 (12/09 0900) SpO2:  [91 %-99 %] 95 % (12/09 0900) Weight:  [80.7 kg-83.9 kg] 80.7 kg (12/09 0500) Weight change: -1.8 kg Last BM Date: 11/15/20  PE: Tachypneic, able to speak in few words not full sentences, on oxygen via nasal cannula, slightly tachycardic GENERAL: Lying on bed, awake, oriented to person place ABDOMEN: Soft, nondistended, nontender, normoactive bowel sounds EXTREMITIES: No obvious deformity  Lab Results: Results for orders placed or performed during the hospital encounter of 10/28/20 (from the past 48 hour(s))  Glucose, capillary     Status: Abnormal   Collection Time: 11/14/20 11:55 AM  Result Value Ref Range   Glucose-Capillary 105 (H) 70 - 99 mg/dL    Comment: Glucose reference range applies only to samples taken after fasting for at least 8 hours.  CBC     Status: Abnormal   Collection Time: 11/14/20 12:18 PM  Result Value Ref Range   WBC 9.5 4.0 - 10.5 K/uL   RBC 2.88 (L) 4.22 - 5.81 MIL/uL   Hemoglobin 8.8 (L) 13.0 - 17.0 g/dL   HCT 26.4 (L) 39.0 - 52.0 %   MCV 91.7 80.0 - 100.0 fL   MCH 30.6 26.0 - 34.0 pg   MCHC 33.3 30.0 - 36.0 g/dL   RDW 17.4 (H) 11.5 - 15.5 %   Platelets 228 150 - 400 K/uL   nRBC 0.0 0.0 - 0.2 %    Comment: Performed at [facility] [address] [place], [place] [address]  Glucose, capillary     Status: None   Collection Time: 11/14/20  3:55 PM  Result Value Ref Range   Glucose-Capillary 92 70 - 99 mg/dL    Comment: Glucose reference range applies only to samples taken after fasting for at least 8 hours.  Glucose, capillary     Status: Abnormal   Collection Time:  11/14/20  8:14 PM  Result Value Ref Range   Glucose-Capillary 105 (H) 70 - 99 mg/dL    Comment: Glucose reference range applies only to samples taken after fasting for at least 8 hours.  Glucose, capillary     Status: Abnormal   Collection Time: 11/15/20 12:52 AM  Result Value Ref Range   Glucose-Capillary 111 (H) 70 - 99 mg/dL    Comment: Glucose reference range applies only to samples taken after fasting for at least 8 hours.  Glucose, capillary     Status: Abnormal   Collection Time: 11/15/20  4:43 AM  Result Value Ref Range   Glucose-Capillary 109 (H) 70 - 99 mg/dL    Comment: Glucose reference range applies only to samples taken after fasting for at least 8 hours.  Renal function panel (daily at 0500)     Status: Abnormal   Collection Time: 11/15/20  5:08 AM  Result Value Ref Range   Sodium 130 (L) 135 - 145 mmol/L   Potassium 4.2 3.5 - 5.1 mmol/L   Chloride 98 98 - 111 mmol/L   CO2 19 (L) 22 - 32 mmol/L   Glucose, Bld 109 (H) 70 - 99 mg/dL    Comment: Glucose reference range applies only to samples taken  after fasting for at least 8 hours.   BUN 102 (H) 6 - 20 mg/dL   Creatinine, Ser 8.06 (H) 0.61 - 1.24 mg/dL   Calcium 8.9 8.9 - 10.3 mg/dL   Phosphorus 5.9 (H) 2.5 - 4.6 mg/dL   Albumin 1.1 (L) 3.5 - 5.0 g/dL   GFR, Estimated 7 (L) >60 mL/min    Comment: (NOTE) Calculated using the CKD-EPI Creatinine Equation (2021)    Anion gap 13 5 - 15    Comment: Performed at [facility] [address], [place], [place] [address]  Magnesium     Status: None   Collection Time: 11/15/20  5:08 AM  Result Value Ref Range   Magnesium 2.2 1.7 - 2.4 mg/dL    Comment: Performed at [facility] [address], [place], [place] [address]  CBC     Status: Abnormal   Collection Time: 11/15/20  5:08 AM  Result Value Ref Range   WBC 8.3 4.0 - 10.5 K/uL   RBC 2.30 (L) 4.22 - 5.81 MIL/uL   Hemoglobin 7.0 (L) 13.0 - 17.0 g/dL   HCT 21.2 (L) 39.0 - 52.0 %   MCV 92.2 80.0 - 100.0  fL   MCH 30.4 26.0 - 34.0 pg   MCHC 33.0 30.0 - 36.0 g/dL   RDW 17.2 (H) 11.5 - 15.5 %   Platelets 206 150 - 400 K/uL   nRBC 0.0 0.0 - 0.2 %    Comment: Performed at [facility], [place] [address] [place], [place] [address]  Type and screen [place]     Status: None (Preliminary result)   Collection Time: 11/15/20  6:59 AM  Result Value Ref Range   ABO/RH(D) O POS    Antibody Screen NEG    Sample Expiration 11/18/2020,2359    Unit Number [id number]    Blood Component Type RED CELLS,LR    Unit division 00    Status of Unit ISSUED    Transfusion Status OK TO TRANSFUSE    Crossmatch Result      Compatible Performed at [facility], [place] [address] [place], [place] [address]    Unit Number [id number]    Blood Component Type RED CELLS,LR    Unit division 00    Status of Unit ISSUED    Transfusion Status OK TO TRANSFUSE    Crossmatch Result Compatible   Glucose, capillary     Status: Abnormal   Collection Time: 11/15/20  8:06 AM  Result Value Ref Range   Glucose-Capillary 102 (H) 70 - 99 mg/dL    Comment: Glucose reference range applies only to samples taken after fasting for at least 8 hours.  Prepare RBC (crossmatch)     Status: None   Collection Time: 11/15/20  8:30 AM  Result Value Ref Range   Order Confirmation      ORDER PROCESSED BY BLOOD BANK Performed at [facility], [address], [place], [place] [address]   Glucose, capillary     Status: None   Collection Time: 11/15/20 11:47 AM  Result Value Ref Range   Glucose-Capillary 88 70 - 99 mg/dL    Comment: Glucose reference range applies only to samples taken after fasting for at least 8 hours.  Hemoglobin and hematocrit, blood     Status: Abnormal   Collection Time: 11/15/20  1:10 PM  Result Value Ref Range   Hemoglobin 8.7 (L) 13.0 - 17.0 g/dL   HCT 26.2 (  L) 39.0 - 52.0 %    Comment: Performed at [facility], [place] [address] [place], [place] [address]  Occult  blood card to lab, stool     Status: Abnormal   Collection Time: 11/15/20  1:26 PM  Result Value Ref Range   Fecal Occult Bld POSITIVE (A) NEGATIVE    Comment: Performed at [facility] [address], [place], [place] [address]  Glucose, capillary     Status: Abnormal   Collection Time: 11/15/20  3:31 PM  Result Value Ref Range   Glucose-Capillary 111 (H) 70 - 99 mg/dL    Comment: Glucose reference range applies only to samples taken after fasting for at least 8 hours.  Glucose, capillary     Status: Abnormal   Collection Time: 11/15/20  8:09 PM  Result Value Ref Range   Glucose-Capillary 104 (H) 70 - 99 mg/dL    Comment: Glucose reference range applies only to samples taken after fasting for at least 8 hours.  Glucose, capillary     Status: None   Collection Time: 11/15/20 11:38 PM  Result Value Ref Range   Glucose-Capillary 89 70 - 99 mg/dL    Comment: Glucose reference range applies only to samples taken after fasting for at least 8 hours.  Glucose, capillary     Status: None   Collection Time: 11/16/20  3:34 AM  Result Value Ref Range   Glucose-Capillary 94 70 - 99 mg/dL    Comment: Glucose reference range applies only to samples taken after fasting for at least 8 hours.  Magnesium     Status: None   Collection Time: 11/16/20  5:38 AM  Result Value Ref Range   Magnesium 2.0 1.7 - 2.4 mg/dL    Comment: Performed at [facility], [place] [address], [place], [place] [address]  CBC     Status: Abnormal   Collection Time: 11/16/20  5:38 AM  Result Value Ref Range   WBC 5.9 4.0 - 10.5 K/uL   RBC 2.55 (L) 4.22 - 5.81 MIL/uL   Hemoglobin 7.4 (L) 13.0 - 17.0 g/dL   HCT 23.3 (L) 39.0 - 52.0 %   MCV 91.4 80.0 - 100.0 fL   MCH 29.0 26.0 - 34.0 pg   MCHC 31.8 30.0 - 36.0 g/dL   RDW 16.5 (H) 11.5 - 15.5 %   Platelets 165 150 - 400 K/uL   nRBC 0.0 0.0 - 0.2 %    Comment: Performed at [facility], [place] [address], [place], [place] [address]  Renal function panel      Status: Abnormal   Collection Time: 11/16/20  5:38 AM  Result Value Ref Range   Sodium 133 (L) 135 - 145 mmol/L   Potassium 3.9 3.5 - 5.1 mmol/L   Chloride 97 (L) 98 - 111 mmol/L   CO2 24 22 - 32 mmol/L   Glucose, Bld 121 (H) 70 - 99 mg/dL    Comment: Glucose reference range applies only to samples taken after fasting for at least 8 hours.   BUN 79 (H) 6 - 20 mg/dL   Creatinine, Ser 6.91 (H) 0.61 - 1.24 mg/dL   Calcium 9.1 8.9 - 10.3 mg/dL   Phosphorus 5.4 (H) 2.5 - 4.6 mg/dL   Albumin 1.2 (L) 3.5 - 5.0 g/dL   GFR, Estimated 9 (L) >60 mL/min    Comment: (NOTE) Calculated using the CKD-EPI Creatinine Equation (2021)    Anion gap 12 5 - 15  Comment: Performed at [facility], [place] [address], [place], [place] [address]    Studies/Results: CT ABDOMEN PELVIS WO CONTRAST  Result Date: 11/15/2020 CLINICAL DATA:  59 year old male with bacteremia last month, end stage renal disease on dialysis. Severe esophagitis and duodenal ulcers on recent EGD. Continued drop in hemoglobin with liquid black stool suspicious for ongoing upper GI blood loss. EXAM: CT ABDOMEN AND PELVIS WITHOUT CONTRAST TECHNIQUE: Multidetector CT imaging of the abdomen and pelvis was performed following the standard protocol without IV contrast. COMPARISON:  CT Abdomen and Pelvis 11/07/2020. FINDINGS: Lower chest: Increased layering pleural effusions at the lung bases, small. Continued bilateral lower lobe consolidation, although more suggestive of atelectasis than pneumonia on previous contrast enhanced CT. Overall stable lung base ventilation. Stable mild cardiomegaly. No pericardial effusion. Hepatobiliary: Contracted gallbladder now with mild pericholecystic fluid and/or gallbladder wall thickening (series 3, image 32). No other regional inflammation. Negative noncontrast liver. Pancreas: Negative noncontrast pancreas. Spleen: Negative. Adrenals/Urinary Tract: No retroperitoneal hematoma. Stable native left renal  atrophy. Absent right kidney. Thickening of the adrenal glands is stable compatible with adrenal hyperplasia. Completely decompressed urinary bladder as before. Stomach/Bowel: Rectal tube in place. Intermittent diverticulosis of the large bowel, moderate overall with most segments affected. Some residual oral contrast within the large bowel and especially diverticula. No large bowel inflammation identified. Normal appendix (series 3, image 52). No dilated small bowel.  No free air or free fluid. Enteric tube terminates at the gastric antrum. Unremarkable visible distal esophagus, stomach and duodenum. Vascular/Lymphatic: Widespread calcified atherosclerosis, including involvement of the aorta. Normal caliber abdominal aorta. Vascular patency is not evaluated in the absence of IV contrast. No lymphadenopathy. Reproductive: Negative. Other: Generalized increased subcutaneous body wall edema since November (series 3, image 49 today). Similar increased but mild presacral stranding. No pelvic free fluid. Musculoskeletal: Lower thoracic spina bifida occulta (normal variant). 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 No acute osseous abnormality identified. IMPRESSION: 1. Negative for retroperitoneal bleeding or other hematoma collection in the abdomen or pelvis. 2. A degree of anasarca has developed since 11/07/2020, including new body wall edema and small layering pleural effusions. Contracted gallbladder with new mild pericholecystic fluid and/or wall edema also suspected due to anasarca. 3. Enteric tube terminates in the distal stomach. Rectal tube in place. No bowel inflammation is evident. Widespread large bowel diverticulosis. 4. Continued bilateral lower lobe lung consolidation, although more suggestive of atelectasis than pneumonia on prior CT. 5. Aortic Atherosclerosis (ICD10-I70.0). Electronically Signed   By: Genevie Ann M.D.   On: 11/15/2020 23:45   DG Chest 1 View  Result Date: 11/14/2020 CLINICAL DATA:  Congestion,  possible aspiration EXAM: CHEST  1 VIEW COMPARISON:  Portable exam 1601 hours compared to 11/08/2020 FINDINGS: Feeding tube extends into the stomach. RIGHT jugular line tip projects over RIGHT atrium unchanged. LEFT subclavian central venous catheter tip projects over LEFT brachiocephalic vein near SVC confluence unchanged. Enlargement of cardiac silhouette. Moderate partially loculated RIGHT pleural effusion. Bibasilar consolidation versus atelectasis. Upper lungs clear. No pneumothorax. IMPRESSION: Partially loculated RIGHT pleural effusion. Enlargement of cardiac silhouette. Atelectasis versus consolidation of the lower lobes. Electronically Signed   By: Lavonia Dana M.D.   On: 11/14/2020 19:00   DG Swallowing Func-Speech Pathology  Result Date: 11/14/2020 Objective Swallowing Evaluation: Type of Study: MBS-Modified Barium Swallow Study  Patient Details Name: ALAIN DESCHENE MRN: [id number] Date of Birth: [birth year] Today's Date: 11/14/2020 Time: SLP Start Time (ACUTE ONLY): 1400 -SLP Stop Time (ACUTE ONLY): 1425 SLP Time Calculation (min) (ACUTE  ONLY): 25 min Past Medical History: Past Medical History: Diagnosis Date . Acute ST elevation myocardial infarction (STEMI) of anterolateral wall ([facility]) 10/28/2020  Culprit lesion, 99% ulcerated mid LAD at D1 -> DES PCI . Anemia of chronic disease 01/30/2015 . Coronary artery disease involving native coronary artery of native heart with unstable angina pectoris ([facility]) 10/28/2020  Acute anterolateral STEMI: 3V CAD: Culprit Lesion = 99% mid LAD lesion (just after D1) - DES PCI; 99% ostial AV groove LCx; & 80% calcified napkin ring proximal RCA lesion with extensive calcification throughout the RCA. Successful PTCA and DES PCI of the LAD crossing D1 -resolute Onyx DES 2.75 mm x 18 mm postdilated to 3.1 mm. With PCI, there . End-stage renal disease on hemodialysis ([place] [facility])  . GERD (gastroesophageal reflux disease)   takes Nexium . Hyperlipidemia with target LDL less than 70  10/28/2020 . Hypertension  . Pneumonia   as a child . 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 Sleep apnea   uses c-pap 2 yrs Past Surgical History: Past Surgical History: Procedure Laterality Date . AV FISTULA PLACEMENT Left 12/22/2017  Procedure: REPAIR PSEUDOANEURYSM ARTERIOVENOUS (AV) FISTULA;  Surgeon: [name], MD;  Location: [facility];  Service: Vascular;  Laterality: Left; . BASCILIC VEIN TRANSPOSITION Left 01/05/2014  Procedure: LEFT 1ST STAGE BASCILIC VEIN TRANSPOSITION;  Surgeon: Conrad [place], MD;  Location: [facility];  Service: Vascular;  Laterality: Left; . BASCILIC VEIN TRANSPOSITION Left 07/26/2014  Procedure: Left Arm Brachial Vein Transposition Second Stage;  Surgeon: Conrad Tickfaw, MD;  Location: [facility];  Service: Vascular;  Laterality: Left; . COLONOSCOPY   . COLONOSCOPY N/A 02/02/2015  Procedure: COLONOSCOPY;  Surgeon: [name], MD;  Location: [facility] ENDOSCOPY;  Service: Endoscopy;  Laterality: N/A; . CORONARY STENT INTERVENTION N/A 10/28/2020  Procedure: CORONARY STENT INTERVENTION;  Surgeon: Leonie Man, MD;  Location: [facility];  Service: Cardiovascular;  Laterality: N/A; . CORONARY/GRAFT ACUTE MI REVASCULARIZATION N/A 10/28/2020  Procedure: Coronary/Graft Acute MI Revascularization;  Surgeon: Leonie Man, MD;  Location: [facility];  Service: Cardiovascular;  Laterality: N/A; . ESOPHAGOGASTRODUODENOSCOPY Left 01/31/2015  Procedure: ESOPHAGOGASTRODUODENOSCOPY (EGD);  Surgeon: [name], MD;  Location[facility] ENDOSCOPY;  Service: Endoscopy;  Laterality: Left; . ESOPHAGOGASTRODUODENOSCOPY N/A 11/09/2020  Procedure: ESOPHAGOGASTRODUODENOSCOPY (EGD);  Surgeon: Clarene Essex, MD;  Location: [facility];  Service: Endoscopy;  Laterality: N/A; . EVALUATION UNDER ANESTHESIA WITH HEMORRHOIDECTOMY N/A 02/26/2018  Procedure: ANORECTAL EXAM UNDER ANESTHESIA  HEMORRHOIDECTOMY x 2  HEMORRHOIDAL  LIGATION AND PEXY;  Surgeon: Tomy Boston, MD;  Location: [facility];  Service: General;  Laterality: N/A; . GIVENS CAPSULE STUDY N/A 01/31/2015  Procedure: GIVENS CAPSULE STUDY;  Surgeon: [name], MD;  Location: [facility] ENDOSCOPY;  Service: Endoscopy;  Laterality: N/A; . LEFT HEART CATH AND CORONARY ANGIOGRAPHY N/A 10/28/2020  Procedure: LEFT HEART CATH AND CORONARY ANGIOGRAPHY;  Surgeon: Leonie Man, MD;  Location: [facility];  Service: Cardiovascular;  Laterality: N/A; . MOUTH SURGERY    teeth cleaning . 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 He describes his chest tightness, 7/10, with radiation to his back (between his scapula), he has not exerted. EKG in  ED is suggestive of anterolateral ST elevations. In the ED he was given thorazine for hiccups; CXR 10/31/20 indicated New patchy right mid and lower lung infiltrate; CT chest 10/31/20 yielded results including: Extensive multifocal nodular and patchy airspace disease in both lungs with a slight peripheral predominance in the upper lungs. 3.1 cm nodular consolidative opacity in the right upper lobe is cavitated. Imaging features likely related to multifocal pneumonia. Pt intubated 11/23-12/3/21; gastroenterology consult 11/09/20 with UGI completed and results as follows: Moderately severe reflux and erosive esophagitis  with no bleeding; Hematin (altered blood/coffee-ground-like material) in the gastric antrum, in the cardia, in  the gastric fundus and in the gastric body. Fluid aspiration performed. Probable ischemic duodenitis. Normal third portion of the duodenum. The examination was otherwise normal. BSE generated.  Subjective: patient awake and alert but did not attempt to verbalize, was having difficulty sitting up and was  unable to keep head up during study Assessment / Plan / Recommendation CHL IP CLINICAL IMPRESSIONS 11/14/2020 Clinical Impression Patient presents with a moderate oropharyngeal dysphagia with likely impact from current cognitive status and pain leading to difficulty maintaining adequate posture and participation during MBS. Patient kept head in a downward, almost chin tuck posture and was not able to raise head despite tactile and physical cues. Oral delay in transit led to premature spillage into vallecular sinus, and swallow initiation occuring at level of vallecular sinus. With puree bolus, patient exhibited more significant delay in swallow initiation and had mild-mod vallecular residuals post initial swallow. No penetration or aspiration observed and no significant amount of pyriform sinus residuals was observed. UES opening appeared [facility] and without evidence of retrograde movement of boluses. Currently, patient is not safe for full PO's, but this is largely due to his inability to maintain adequate participation combined with his overall weakness. Recommendation is remain NPO but allow for SLP to assess patient toleration of trials of thin liquids, nectar thick liquids, puree solids at bedside. SLP Visit Diagnosis Dysphagia, oropharyngeal phase (R13.12) Attention and concentration deficit following -- Frontal lobe and executive function deficit following -- Impact on safety and function Risk for inadequate nutrition/hydration;Mild aspiration risk;Moderate aspiration risk   CHL IP TREATMENT RECOMMENDATION 11/14/2020 Treatment Recommendations Therapy as outlined in treatment plan below   Prognosis 11/14/2020 Prognosis for Safe Diet Advancement Good Barriers to Reach Goals -- Barriers/Prognosis Comment -- CHL IP DIET RECOMMENDATION 11/14/2020 SLP Diet Recommendations NPO;Other (Comment) Liquid Administration via -- Medication Administration Via alternative means Compensations -- Postural Changes --   CHL IP OTHER  RECOMMENDATIONS 11/14/2020 Recommended Consults -- Oral Care Recommendations Oral care QID Other Recommendations --   CHL IP FOLLOW UP RECOMMENDATIONS 11/14/2020 Follow up Recommendations 24 hour supervision/assistance;Skilled Nursing facility;Inpatient Rehab   CHL IP FREQUENCY AND DURATION 11/14/2020 Speech Therapy Frequency (ACUTE ONLY) min 2x/week Treatment Duration 1 week      CHL IP ORAL PHASE 11/14/2020 Oral Phase -- Oral - Pudding Teaspoon -- Oral - Pudding Cup -- Oral - Honey Teaspoon -- Oral - Honey Cup -- Oral - Nectar Teaspoon Premature spillage;Left anterior bolus loss;Right anterior bolus loss;Weak lingual manipulation;Delayed oral transit Oral - Nectar Cup Delayed oral transit;Weak lingual manipulation;Right anterior bolus loss;Left anterior bolus loss;Premature spillage Oral - Nectar Straw -- Oral - Thin Teaspoon Premature spillage;Weak lingual manipulation;Left anterior bolus loss;Right anterior bolus loss;Delayed oral transit Oral - Thin Cup -- Oral - Thin Straw -- Oral - Puree Weak lingual manipulation;Delayed oral transit;Premature spillage Oral - Mech Soft -- Oral - Regular -- Oral -  Multi-Consistency -- Oral - Pill -- Oral Phase - Comment --  CHL IP PHARYNGEAL PHASE 11/14/2020 Pharyngeal Phase -- Pharyngeal- Pudding Teaspoon -- Pharyngeal -- Pharyngeal- Pudding Cup -- Pharyngeal -- Pharyngeal- Honey Teaspoon -- Pharyngeal -- Pharyngeal- Honey Cup -- Pharyngeal -- Pharyngeal- Nectar Teaspoon Delayed swallow initiation-vallecula;Pharyngeal residue - valleculae Pharyngeal -- Pharyngeal- Nectar Cup Delayed swallow initiation-vallecula;Pharyngeal residue - valleculae Pharyngeal -- Pharyngeal- Nectar Straw -- Pharyngeal -- Pharyngeal- Thin Teaspoon Delayed swallow initiation-vallecula;Pharyngeal residue - valleculae Pharyngeal -- Pharyngeal- Thin Cup Pharyngeal residue - valleculae;Delayed swallow initiation-vallecula;Reduced tongue base retraction Pharyngeal -- Pharyngeal- Thin Straw -- Pharyngeal --  Pharyngeal- Puree Pharyngeal residue - valleculae;Delayed swallow initiation-vallecula;Reduced tongue base retraction Pharyngeal -- Pharyngeal- Mechanical Soft -- Pharyngeal -- Pharyngeal- Regular -- Pharyngeal -- Pharyngeal- Multi-consistency -- Pharyngeal -- Pharyngeal- Pill -- Pharyngeal -- Pharyngeal Comment --  CHL IP CERVICAL ESOPHAGEAL PHASE 11/14/2020 Cervical Esophageal Phase WFL Pudding Teaspoon -- Pudding Cup -- Honey Teaspoon -- Honey Cup -- Nectar Teaspoon -- Nectar Cup -- Nectar Straw -- Thin Teaspoon -- Thin Cup -- Thin Straw -- Puree -- Mechanical Soft -- Regular -- Multi-consistency -- Pill -- Cervical Esophageal Comment -- Sonia Baller, MA, CCC-SLP Speech Therapy [facility]              Medications: I have reviewed the patient's current medications.  Assessment: Severe esophagitis and ischemic appearing duodenal ulcers noted on EGD from 11/09/2020 Ongoing drop in hemoglobin and dark black stools, patient continued on Brilinta for CAD/anterior STEMI  Multiple comorbidities-end-stage renal disease on hemodialysis, atrial fibrillation with RVR, bacteremia, recent STEMI s/p PCI, cardiac arrest/PEA   Plan: Spoke with the patient, his daughter at bedside and his wife Myra over the phone. As patient is on antiplatelet for a recently placed cardiac stent, anemia is likely contributed by bleeding from esophagitis and duodenal ulcers. Patient is on IV Protonix at 8 mg/h as well as sucralfate 4 times a day. If patient continues to drop his hemoglobin with ongoing episodes of melena, we may have to hold Brilinta if this is approved by cardiology. Source of bleeding is likely underlying esophagitis and duodenal ulcers. It is unclear if repeating an EGD will change management. Patient's respiratory status is NOT stable for anesthesia, discussed the same with patient and his wife.  Ronnette Juniper, MD 11/16/2020, 9:18 AM

## 2020-11-16 NOTE — Progress Notes (Signed)
11/16/2020   I have seen and evaluated the patient for respiratory failure.  S:  More awake and grumpy this AM.  Pressure ulcer on sacrum hurting.  Issues with fistula clotting yesterday cutting HD short.  Daughter at bedside.  O: Blood pressure 126/85, pulse (!) 107, temperature 100 F (37.8 C), temperature source Oral, resp. rate 19, height 5\' 11"  (1.803 m), weight 80.7 kg, SpO2 95 %.  Chronically ill appearing man laying in bed MM dry, cortrak in place, trachea midline Scattered rhonci, occasional accessory muscle use Ext with muscle wasting  Labs reviewed, H/H unfortunately continues to drop  A:  - Anterior STEMI s/p DES 11/20 trying to get by with brillinta alone - Ischemic cardiomyopathy 40-45% - Afib previously on [facility] held for ongoing bleeding - Ongoing slow GIB with recent EGD showing severe esophagitis and duodenal ulceration question ischemic - MSSA bacteremia thought related to HD catheter on prolonged abx therapy - New loculated R effusion - Unstagable sacral ulcer pressure injury still awaiting PT eval for hydrotherapy - ESRD on HD - Dysphagia  P:  - 2 more pRBC, PPI, repeat EGD trying to avoid as would likely need intubation for this - Up to chair with hoyer - Encourage pulmonary toileting - Continue prolonged course of cefazolin - Continue brilinta for now - Evaluate R chest for pigtail - Hydrotherapy hopefully soon for pressure ulcer - iHD and fistula interrogation per nephrology - Continue PT/OT/SLP, TF supplementation - Remains high risk for reintubation   Patient critically ill due to hypoxemic respiratory failure Interventions to address this today potential chest tube, abx Risk of deterioration without these interventions is high  I personally spent 38 minutes providing critical care not including any separately billable procedures  Erskine Emery MD [place] [facility] 11/16/2020 10:59 AM Personal pager: [phone] If unanswered, please  page CCM On-call: [phone]   11/16/2020 Erskine Emery MD    NAME:  Travis [name], MRN:  [id number], DOB:  1961/11/18, LOS: 2 ADMISSION DATE:  10/28/2020, CONSULTATION DATE:  10/29/20 REFERRING MD:  Cardiology - [name], CHIEF COMPLAINT:  Hypotension, gram positive cocci on culture, AMS  Brief History   Travis [name] is a 59 year old man with a history of HTN, ESRD on HD MWF, RUE AV fistula, hx of smoking, admitted 10/28/20 with fevers, myalgias for several days, acute chest pain.    History of present illness   On Admission, found to have STEMI, multivessel disease. Pulm edema with mild hypoxemia.  S/Post PCI to the LAD on 10/29/19. Planning for staged intervention to the left circumflex and possibly right coronary artery.  EF 37-62%, diastolic dysfunction, LVEDP 35. Underwent HD on 11/20 with 4 L volume removed.  Saturation improved after dialysis (100% on RA) Fevers noted 11/20. Blood cultures grew MSSA, noted this evening. .    This evening developed Afib with RVR 180s. Adenosine given, without improvement  Amiodarone infusion started per cardiology, 150mg  bolus, then drip.  Lopressor 5mg IV and 500ccNS given. Started on Phenylephrine, was on 255mcg on my arrival.   Cardioverted emergently at 120J for ongoing afib with hypotension (60/40) and AMS.  Converted to sinus.  Remained moderately hypotensive (MAP 60) on phenylephrine.   Patient was given one dose zosyn, switched to ancef once cultures grew back MSSA.   Cardiac stress tests annually, last done 11/16/19.    Past Medical History  HTN ESRD, HD MWF  Significant Hospital Events   Cardioversion 11/21 Cardiac cath stent to LAD 11/20  Consults:  PCCM  Nephrology ID  Procedures:  Central line 11/21 >> 11/29 Arterial line 11/21 Repeat DCCV 11/23 11/23 ETT HD line 11/24 Arterial line 11/24 L [place] CVC 11/30>>   Significant Diagnostic Tests:  11/24 TEE>>Left ventricular ejection fraction, by estimation, is 35 to 40%. The   left ventricle has moderately decreased function.Moderate to severe mitral regurgitation. 11/23 CT Chest>>Extensive multifocal nodular and patchy airspace disease in both lungs with a slight peripheral predominance in the upper lungs. 3.1 cm nodular consolidative opacity in the right upper lobe is cavitated. Imaging features likely related to multifocal pneumonia. Septic emboli and metastatic disease considered less likely but not excluded. 11/22 LUE Vas Upper Extremity Doppler>> Arteriovenous fistula-Aneurysmal dilatation noted. 11/24 CT abdomen - no retroperitoneal hematoma 11/29 MR Brain>> No evidence acute intracranial abnormality, sinisitis, mastoid effusions 11/29>> MR Cervical Spine>> Given the provided history of bacteremia, facet joint septic arthritis is difficult to definitively exclude, but the lack of any surrounding marrow edema argues against this Cervical spondylosis  11/29 MR Thoracic Spine No significant marrow edema in thoracic spine , Mild thoracic spondylosis hypointense marrow signal throughout the thoracic spine, likely related to the patient's end-stage renal disease. multifocal airspace disease and cavitary pulmonary lesions. Small right pleural effusion. 11/29 MR Lumbar Spine:As noted above and Suspected cholecystolithiasis  Micro Data:  11/21 BCx2>>Staph aureus > MSSA by BCID 11/24 BCx2>> 11/30 BC x 2>> staph epi>> 11/09/2020 blood cultures x2 no growth Antimicrobials:  Zosyn 11/21 x 1 Ancef 11/21 ->  Interim history/subjective:  Off vasopressors hemoglobin is dropped required 2 units packed cells plus third ordered. Neurology is following again.  Objective   Blood pressure 108/70, pulse (!) 104, temperature 100 F (37.8 C), temperature source Oral, resp. rate (!) 39, height 5\' 11"  (1.803 m), weight 80.7 kg, SpO2 96 %. CVP:  [1 mmHg-6 mmHg] 6 mmHg      Intake/Output Summary (Last 24 hours) at 11/16/2020 0847 Last data filed at 11/16/2020 7846 Gross per 24  hour  Intake 1950.59 ml  Output 2851 ml  Net -900.41 ml   Filed Weights   11/15/20 0700 11/15/20 1230 11/16/20 0500  Weight: 85.4 kg 83.9 kg 80.7 kg   General: Ill-appearing male who is awake alert follows commands HEENT: No JVD is appreciated at this time. Neuro: Pressly intact but weak CV: Heart sounds regular sinus tach 105 PULM: Decreased chest excursion. Congested cough. Poor cough mechanics GI: Positive bowel sounds. Rectal tube is in place Extremities: warm/dry,  edema  Skin: Large sacral decubitus is noted      Resolved Hospital Problem list     Assessment & Plan:   Anterior STEMI, CAD, acute CHF exacerbation- will need definitive PCI once recovers.  DES 11/20 so needs antiplatelets.  Per cardiology Unable to anticoagulate due to GI bleed   Afib s/p amio now sinus rhythm- H/H drop with heparin challege 12/5 No anticoagulation Currently sinus rhythm Low-dose beta-blocker per cardiology   MSSA bacteremia, source thought HD site on cefazolin ESRD on HD Day 3 of Ancef Pulling negative for renal  UGIB- Recent Labs    11/15/20 1310 11/16/20 0538  HGB 8.7* 7.4*     secondary to severe esophagitis and duodenal ulcer question ischemic s/p EGD on 11/09/20  Gastroenterology has weighed back 11/16/2020 to treat the hemoglobin with further transfusions and try to avoid EGD at this time. If he gets reintubated then EGD will be possible Transfuse 1 unit of blood Goal is to keep his hemoglobin approximately 8-9 due to  severe cardiac disease Continue proton pump inhibitor drip Appreciate gastroenterology's input    Acute toxic metabolic encephalopathy, ICU delirium- resolved  Acute hypoxemic respiratory failure- Resolved Pulmonary toilet  Pressure ulcer  Wound ostomy care     - Will reach back out to [facility] GI regarding ongoing slow H/H loss to see if repeat EGD warranted - 2 units pRBC - Check CT A/P to make sure not missing any non-GI source of  bleeding - Wean precedex, lower seroquel given somnolence today - Appreciate continued SLP work with patient - Continue to hold heparin for now - Continue brilinta per cardiology - Continue amiodarone - Monitor H/H, continue PPI - Local wound care, PT/OT needs mobility if pressure ulcer to heal - iHD per nephrology, to have today  Best practice:  Diet: PO trials with SLP, TF in interim Pain/Anxiety/Delirium protocol (if indicated): see above VAP protocol (if indicated): off DVT prophylaxis: SCDs + antiplatelet agent GI prophylaxis: protonix Glucose control: SSI for now Mobility: Bedrest Code Status: Full  Family Communication: wife at bedside daily updated Disposition: ICU pending liberation from pressors and precedex   App cct 34 min  Richardson Landry Minor ACNP Acute Care Nurse Practitioner [place] Please consult Amion 11/16/2020, 9:00 AM

## 2020-11-16 NOTE — Progress Notes (Signed)
Occupational Therapy Treatment Patient Details Name: Travis [name] MRN: [id number] DOB: 05/16/1961 Today's Date: 11/16/2020    History of present illness 59 y.o. male with past medical history of end-stage renal disease on hemodialysis, essential hypertension, dilated ascending aorta and hyperlipidemia who presented on November 20 with anterior ST elevation myocardial infarction.  VDRF 11/23-12/3.  Stent placed 11/20.  Afib with RVR with cardioversion 11/21. Suspected cholecystolithiasis 11/29.     OT comments  Patient making slow progress toward goals.  More alert and cooperative this date.  Continues with the deficits listed below.  Currently he is needing Max A to Total A for all mobility and ADL.  He remains NPO.  Sacral wound is a complicating factor.  OT to continue in the acute setting to maximize functional independence.  CIR has been recommended.    Follow Up Recommendations  CIR;Supervision/Assistance - 24 hour    Equipment Recommendations  3 in 1 bedside commode;Wheelchair (measurements OT);Wheelchair cushion (measurements OT)    Recommendations for Other Services      Precautions / Restrictions         Mobility Bed Mobility Overal bed mobility: Needs Assistance Bed Mobility: Rolling Rolling: Min guard rolling to the left ;Min assist with rolling to the right                                  Balance   Sitting-balance support: No upper extremity supported;Feet supported;Bilateral upper extremity supported Sitting balance-Leahy Scale: Poor Sitting balance - Comments: From the recliner: at times he was able to pull himself to lean forward and attempt to adjust his positioning off his bottom.  At other times up to Mod A to lean forward or side to side. Postural control: Posterior lean;Right lateral lean;Left lateral lean                                 ADL either performed or assessed with clinical judgement   ADL   Eating/Feeding:  NPO                   Lower Body Dressing: Total assistance;Bed level                       Vision Patient Visual Report: No change from baseline                Cognition Arousal/Alertness: Awake/alert Behavior During Therapy: Flat affect Overall Cognitive Status: Impaired/Different from baseline                                          Exercises     Shoulder Instructions       General Comments VSS    Pertinent Vitals/ Pain       Pain Assessment: Faces Faces Pain Scale: Hurts even more Pain Location: bottom and peri area Pain Descriptors / Indicators: Discomfort;Guarding;Restless Pain Intervention(s): Monitored during session                                                          Frequency  Min 2X/week        Progress Toward Goals  OT Goals(current goals can now be found in the care plan section)  Progress towards OT goals: Progressing toward goals  Acute Rehab OT Goals Patient Stated Goal: to go home OT Goal Formulation: With patient/family Time For Goal Achievement: 11/28/20 Potential to Achieve Goals: [place] Discharge plan remains appropriate    Co-evaluation    PT/OT/SLP Co-Evaluation/Treatment: Yes Reason for Co-Treatment: Complexity of the patient's impairments (multi-system involvement);For patient/therapist safety   OT goals addressed during session: ADL's and self-care      AM-PAC OT "6 Clicks" Daily Activity     Outcome Measure   Help from another person eating meals?: Total Help from another person taking care of personal grooming?: A Lot Help from another person toileting, which includes using toliet, bedpan, or urinal?: Total Help from another person bathing (including washing, rinsing, drying)?: A Lot Help from another person to put on and taking off regular upper body clothing?: Total Help from another person to put on and taking off regular lower body clothing?:  Total 6 Click Score: 8    End of Session    OT Visit Diagnosis: Unsteadiness on feet (R26.81);Other abnormalities of gait and mobility (R26.89);Muscle weakness (generalized) (M62.81);Pain Pain - Right/Left:  (sacral area)   Activity Tolerance Patient limited by fatigue   Patient Left in bed;with call bell/phone within reach;with bed alarm set   Nurse Communication Other (comment) (patient in bed)        Time: 1219-7588 OT Time Calculation (min): 25 min  Charges: OT General Charges $OT Visit: 1 Visit OT Treatments $Self Care/Home Management : 8-22 mins  11/16/2020  Rich, OTR/L  Acute Rehabilitation Services  Office:  [phone]    Metta Clines 11/16/2020, 1:55 PM

## 2020-11-16 NOTE — Progress Notes (Signed)
eLink Physician-Brief Progress Note Patient Name: CHRISTIAAN STREBECK DOB: 28-Jun-1961 MRN: [id number]   Date of Service  11/16/2020  HPI/Events of Note  Patient with pain from sacral wound not controlled by oral tylenol.  eICU Interventions  PRN oral oxycodone ordered.        Kerry Kass Jazalyn Mondor 11/16/2020, 12:53 AM

## 2020-11-16 NOTE — Progress Notes (Signed)
Progress Note  Patient Name: Travis [name] Date of Encounter: 11/16/2020  [facility] Cardiologist: Glenetta Hew, MD   Subjective   2 units pRBCs with HD yesterday. Only c/o back pain this am.   Inpatient Medications    Scheduled Meds: . sodium chloride   Intravenous Once  . amiodarone  200 mg Per Tube Daily  . atorvastatin  80 mg Per Tube q1800  . B-complex with vitamin C  1 tablet Per Tube Daily  . chlorhexidine  15 mL Mouth Rinse BID  . Chlorhexidine Gluconate Cloth  6 each Topical Q0600  . clonazepam  1 mg Per Tube BID  . collagenase   Topical Daily  . doxercalciferol  5 mcg Intravenous Q M,W,F  . feeding supplement (PROSource TF)  90 mL Per Tube QID  . guaiFENesin  15 mL Per Tube Q4H  . insulin aspart  0-6 Units Subcutaneous Q4H  . lidocaine  1 patch Transdermal Q24H  . mouth rinse  15 mL Mouth Rinse q12n4p  . midodrine  10 mg Per Tube TID WC  . [START ON 11/18/2020] pantoprazole  40 mg Intravenous Q12H  . QUEtiapine  50 mg Per Tube BID  . sevelamer carbonate  1.6 g Per Tube TID WC  . sodium chloride flush  10-40 mL Intracatheter Q12H  . sodium chloride flush  3 mL Intravenous Q12H  . sucralfate  1 g Per Tube QID  . ticagrelor  90 mg Per Tube BID   Continuous Infusions: . sodium chloride Stopped (11/10/20 1944)  . sodium chloride Stopped (10/28/20 0954)  . sodium chloride Stopped (11/04/20 0016)  . sodium chloride    . sodium chloride Stopped (11/14/20 1600)  .  ceFAZolin (ANCEF) IV 2 g (11/15/20 1228)  . dexmedetomidine (PRECEDEX) IV infusion Stopped (11/14/20 1103)  . feeding supplement (VITAL 1.5 CAL) 55 mL/hr at 11/16/20 0701  . norepinephrine (LEVOPHED) Adult infusion Stopped (11/13/20 0428)  . pantoprozole (PROTONIX) infusion 8 mg/hr (11/16/20 0701)   PRN Meds: sodium chloride, sodium chloride, sodium chloride, acetaminophen, alteplase, haloperidol lactate, heparin, lidocaine (PF), lidocaine-prilocaine, LORazepam, ondansetron (ZOFRAN) IV,  oxyCODONE, pentafluoroprop-tetrafluoroeth, sodium chloride, sodium chloride flush, sodium chloride flush   Vital Signs    Vitals:   11/16/20 0500 11/16/20 0600 11/16/20 0630 11/16/20 0700  BP:      Pulse: (!) 104 (!) 108 (!) 102 (!) 104  Resp: (!) 32 (!) 34 (!) 38 (!) 39  Temp:      TempSrc:      SpO2: 94% 97% 95% 96%  Weight: 80.7 kg     Height:        Intake/Output Summary (Last 24 hours) at 11/16/2020 0710 Last data filed at 11/16/2020 6578 Gross per 24 hour  Intake 1950.59 ml  Output 2851 ml  Net -900.41 ml   Last 3 Weights 11/16/2020 11/15/2020 11/15/2020  Weight (lbs) 177 lb 14.6 oz 184 lb 15.5 oz 188 lb 4.4 oz  Weight (kg) 80.7 kg 83.9 kg 85.4 kg      Telemetry    Sinus tachycardia- Personally Reviewed  ECG    Sinus, poor R wave progression precordial leads-personally reviewed.   Physical Exam    General: Well developed, well nourished, ill appearing HEENT: OP clear Neuro: No focal deficits  Musculoskeletal: Muscle strength 5/5 all ext  Psychiatric: Mood and affect normal  Neck: No JVD Lungs:Clear bilaterally, no wheezes, rhonci, crackles Cardiovascular: Regular rate and rhythm. No murmurs, gallops or rubs. Abdomen:Soft. Bowel sounds present. Non-tender.  Extremities:  No lower extremity edema.     Labs    High Sensitivity Troponin:   Recent Labs  Lab 10/28/20 0629 10/28/20 1046 10/28/20 2040 10/29/20 2121 10/30/20 0548  TROPONINIHS 3,155* >27,000* >27,000* >27,000* >27,000*      Chemistry Recent Labs  Lab 11/14/20 0413 11/15/20 0508 11/16/20 0538  NA 132* 130* 133*  K 3.9 4.2 3.9  CL 98 98 97*  CO2 20* 19* 24  GLUCOSE 122* 109* 121*  BUN 63* 102* 79*  CREATININE 6.61* 8.06* 6.91*  CALCIUM 8.8* 8.9 9.1  ALBUMIN 1.1* 1.1* 1.2*  GFRNONAA 9* 7* 9*  ANIONGAP 14 13 12      Hematology Recent Labs  Lab 11/14/20 1218 11/15/20 0508 11/15/20 1310 11/16/20 0538  WBC 9.5 8.3  --  5.9  RBC 2.88* 2.30*  --  2.55*  HGB 8.8* 7.0* 8.7* 7.4*   HCT 26.4* 21.2* 26.2* 23.3*  MCV 91.7 92.2  --  91.4  MCH 30.6 30.4  --  29.0  MCHC 33.3 33.0  --  31.8  RDW 17.4* 17.2*  --  16.5*  PLT 228 206  --  165    BNPNo results for input(s): BNP, PROBNP in the last 168 hours.   DDimer No results for input(s): DDIMER in the last 168 hours.   Radiology    CT ABDOMEN PELVIS WO CONTRAST  Result Date: 11/15/2020 CLINICAL DATA:  59 year old male with bacteremia last month, end stage renal disease on dialysis. Severe esophagitis and duodenal ulcers on recent EGD. Continued drop in hemoglobin with liquid black stool suspicious for ongoing upper GI blood loss. EXAM: CT ABDOMEN AND PELVIS WITHOUT CONTRAST TECHNIQUE: Multidetector CT imaging of the abdomen and pelvis was performed following the standard protocol without IV contrast. COMPARISON:  CT Abdomen and Pelvis 11/07/2020. FINDINGS: Lower chest: Increased layering pleural effusions at the lung bases, small. Continued bilateral lower lobe consolidation, although more suggestive of atelectasis than pneumonia on previous contrast enhanced CT. Overall stable lung base ventilation. Stable mild cardiomegaly. No pericardial effusion. Hepatobiliary: Contracted gallbladder now with mild pericholecystic fluid and/or gallbladder wall thickening (series 3, image 32). No other regional inflammation. Negative noncontrast liver. Pancreas: Negative noncontrast pancreas. Spleen: Negative. Adrenals/Urinary Tract: No retroperitoneal hematoma. Stable native left renal atrophy. Absent right kidney. Thickening of the adrenal glands is stable compatible with adrenal hyperplasia. Completely decompressed urinary bladder as before. Stomach/Bowel: Rectal tube in place. Intermittent diverticulosis of the large bowel, moderate overall with most segments affected. Some residual oral contrast within the large bowel and especially diverticula. No large bowel inflammation identified. Normal appendix (series 3, image 52). No dilated small  bowel.  No free air or free fluid. Enteric tube terminates at the gastric antrum. Unremarkable visible distal esophagus, stomach and duodenum. Vascular/Lymphatic: Widespread calcified atherosclerosis, including involvement of the aorta. Normal caliber abdominal aorta. Vascular patency is not evaluated in the absence of IV contrast. No lymphadenopathy. Reproductive: Negative. Other: Generalized increased subcutaneous body wall edema since November (series 3, image 49 today). Similar increased but mild presacral stranding. No pelvic free fluid. Musculoskeletal: Lower thoracic spina bifida occulta (normal variant). 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 No acute osseous abnormality identified. IMPRESSION: 1. Negative for retroperitoneal bleeding or other hematoma collection in the abdomen or pelvis. 2. A degree of anasarca has developed since 11/07/2020, including new body wall edema and small layering pleural effusions. Contracted gallbladder with new mild pericholecystic fluid and/or wall edema also suspected due to anasarca. 3. Enteric tube terminates in the distal stomach. Rectal tube in place.  No bowel inflammation is evident. Widespread large bowel diverticulosis. 4. Continued bilateral lower lobe lung consolidation, although more suggestive of atelectasis than pneumonia on prior CT. 5. Aortic Atherosclerosis (ICD10-I70.0). Electronically Signed   By: Genevie Ann M.D.   On: 11/15/2020 23:45   DG Chest 1 View  Result Date: 11/14/2020 CLINICAL DATA:  Congestion, possible aspiration EXAM: CHEST  1 VIEW COMPARISON:  Portable exam 1601 hours compared to 11/08/2020 FINDINGS: Feeding tube extends into the stomach. RIGHT jugular line tip projects over RIGHT atrium unchanged. LEFT subclavian central venous catheter tip projects over LEFT brachiocephalic vein near SVC confluence unchanged. Enlargement of cardiac silhouette. Moderate partially loculated RIGHT pleural effusion. Bibasilar consolidation versus atelectasis. Upper  lungs clear. No pneumothorax. IMPRESSION: Partially loculated RIGHT pleural effusion. Enlargement of cardiac silhouette. Atelectasis versus consolidation of the lower lobes. Electronically Signed   By: Lavonia Dana M.D.   On: 11/14/2020 19:00   DG Swallowing Func-Speech Pathology  Result Date: 11/14/2020 Objective Swallowing Evaluation: Type of Study: MBS-Modified Barium Swallow Study  Patient Details Name: KREG EARHART MRN: [id number] Date of Birth: 03-03-1961 Today's Date: 11/14/2020 Time: SLP Start Time (ACUTE ONLY): 1400 -SLP Stop Time (ACUTE ONLY): 1425 SLP Time Calculation (min) (ACUTE ONLY): 25 min Past Medical History: Past Medical History: Diagnosis Date . Acute ST elevation myocardial infarction (STEMI) of anterolateral wall ([place]) 10/28/2020  Culprit lesion, 99% ulcerated mid LAD at D1 -> DES PCI . Anemia of chronic disease 01/30/2015 . Coronary artery disease involving native coronary artery of native heart with unstable angina pectoris ([place]) 10/28/2020  Acute anterolateral STEMI: 3V CAD: Culprit Lesion = 99% mid LAD lesion (just after D1) - DES PCI; 99% ostial AV groove LCx; & 80% calcified napkin ring proximal RCA lesion with extensive calcification throughout the RCA. Successful PTCA and DES PCI of the LAD crossing D1 -resolute Onyx DES 2.75 mm x 18 mm postdilated to 3.1 mm. With PCI, there . End-stage renal disease on hemodialysis ([place])  . GERD (gastroesophageal reflux disease)   takes Nexium . Hyperlipidemia with target LDL less than 70 10/28/2020 . Hypertension  . Pneumonia   as a child . 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 Sleep apnea   uses c-pap 2 yrs Past Surgical History: Past Surgical History: Procedure Laterality Date . AV FISTULA PLACEMENT Left 12/22/2017  Procedure: REPAIR PSEUDOANEURYSM ARTERIOVENOUS (AV) FISTULA;  Surgeon: [name], MD;  Location: [place];  Service: Vascular;   Laterality: Left; . BASCILIC VEIN TRANSPOSITION Left 01/05/2014  Procedure: LEFT 1ST STAGE BASCILIC VEIN TRANSPOSITION;  Surgeon: Conrad [place], MD;  Location: [place];  Service: Vascular;  Laterality: Left; . BASCILIC VEIN TRANSPOSITION Left 07/26/2014  Procedure: Left Arm Brachial Vein Transposition Second Stage;  Surgeon: Conrad [place], MD;  Location: [place];  Service: Vascular;  Laterality: Left; . COLONOSCOPY   . COLONOSCOPY N/A 02/02/2015  Procedure: COLONOSCOPY;  Surgeon: [name], MD;  Location: [facility] ENDOSCOPY;  Service: Endoscopy;  Laterality: N/A; . CORONARY STENT INTERVENTION N/A 10/28/2020  Procedure: CORONARY STENT INTERVENTION;  Surgeon: Leonie Man, MD;  Location: [place] CV LAB;  Service: Cardiovascular;  Laterality: N/A; . CORONARY/GRAFT ACUTE MI REVASCULARIZATION N/A 10/28/2020  Procedure: Coronary/Graft Acute MI Revascularization;  Surgeon: Leonie Man, MD;  Location: [place] [facility] LAB;  Service: Cardiovascular;  Laterality: N/A; . ESOPHAGOGASTRODUODENOSCOPY Left 01/31/2015  Procedure: ESOPHAGOGASTRODUODENOSCOPY (EGD);  Surgeon: Gwyndolyn Saxon  Paulita Fujita, MD;  Location: [place];  Service: Endoscopy;  Laterality: Left; . ESOPHAGOGASTRODUODENOSCOPY N/A 11/09/2020  Procedure: ESOPHAGOGASTRODUODENOSCOPY (EGD);  Surgeon: Clarene Essex, MD;  Location: [place];  Service: Endoscopy;  Laterality: N/A; . EVALUATION UNDER ANESTHESIA WITH HEMORRHOIDECTOMY N/A 02/26/2018  Procedure: ANORECTAL EXAM UNDER ANESTHESIA  HEMORRHOIDECTOMY x 2  HEMORRHOIDAL LIGATION AND PEXY;  Surgeon: Kazuma Boston, MD;  Location: [facility];  Service: General;  Laterality: N/A; . GIVENS CAPSULE STUDY N/A 01/31/2015  Procedure: GIVENS CAPSULE STUDY;  Surgeon: [name], MD;  Location: [facility] ENDOSCOPY;  Service: Endoscopy;  Laterality: N/A; . LEFT HEART CATH AND CORONARY ANGIOGRAPHY N/A 10/28/2020  Procedure: LEFT HEART CATH AND CORONARY ANGIOGRAPHY;  Surgeon: Leonie Man, MD;  Location: [place] CV LAB;  Service:  Cardiovascular;  Laterality: N/A; . MOUTH SURGERY    teeth cleaning . 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 He describes his chest tightness, 7/10, with radiation to his back (between his scapula), he has not exerted. EKG in ED is suggestive of anterolateral ST elevations. In the ED he was given thorazine for hiccups; CXR 10/31/20 indicated New patchy right mid and lower lung infiltrate; CT chest 10/31/20 yielded results including: Extensive multifocal nodular and patchy airspace disease in both lungs with a slight peripheral predominance in the upper lungs. 3.1 cm nodular consolidative opacity in the right upper lobe is cavitated. Imaging features likely related to multifocal pneumonia. Pt intubated 11/23-12/3/21; gastroenterology consult 11/09/20 with UGI completed and results as follows: Moderately severe reflux and erosive esophagitis  with no bleeding; Hematin (altered blood/coffee-ground-like material) in the gastric antrum, in the cardia, in  the gastric fundus and in the gastric body. Fluid aspiration performed. Probable ischemic duodenitis. Normal third portion of the duodenum. The examination was otherwise normal. BSE generated.  Subjective: patient awake and alert but did not attempt to verbalize, was having difficulty sitting up and was unable to keep head up during study Assessment / Plan / Recommendation CHL IP CLINICAL IMPRESSIONS 11/14/2020 Clinical Impression Patient presents with a moderate oropharyngeal dysphagia with likely impact from current cognitive status and pain leading to difficulty maintaining adequate posture and participation during MBS. Patient kept head in a downward, almost chin tuck posture and was not able to raise head despite tactile and physical cues. Oral  delay in transit led to premature spillage into vallecular sinus, and swallow initiation occuring at level of vallecular sinus. With puree bolus, patient exhibited more significant delay in swallow initiation and had mild-mod vallecular residuals post initial swallow. No penetration or aspiration observed and no significant amount of pyriform sinus residuals was observed. UES opening appeared [facility] and without evidence of retrograde movement of boluses. Currently, patient is not safe for full PO's, but this is largely due to his inability to maintain adequate participation combined with his overall weakness. Recommendation is remain NPO but allow for SLP to assess patient toleration of trials of thin liquids, nectar thick liquids, puree solids at bedside. SLP Visit Diagnosis Dysphagia, oropharyngeal phase (R13.12) Attention and concentration deficit following -- Frontal lobe and executive function deficit following -- Impact on safety and function Risk for inadequate nutrition/hydration;Mild aspiration risk;Moderate aspiration risk   CHL IP TREATMENT RECOMMENDATION 11/14/2020 Treatment Recommendations Therapy as outlined in treatment plan below   Prognosis 11/14/2020  Prognosis for Safe Diet Advancement Good Barriers to Reach Goals -- Barriers/Prognosis Comment -- CHL IP DIET RECOMMENDATION 11/14/2020 SLP Diet Recommendations NPO;Other (Comment) Liquid Administration via -- Medication Administration Via alternative means Compensations -- Postural Changes --   CHL IP OTHER RECOMMENDATIONS 11/14/2020 Recommended Consults -- Oral Care Recommendations Oral care QID Other Recommendations --   CHL IP FOLLOW UP RECOMMENDATIONS 11/14/2020 Follow up Recommendations 24 hour supervision/assistance;Skilled Nursing facility;Inpatient Rehab   CHL IP FREQUENCY AND DURATION 11/14/2020 Speech Therapy Frequency (ACUTE ONLY) min 2x/week Treatment Duration 1 week      CHL IP ORAL PHASE 11/14/2020 Oral Phase -- Oral - Pudding Teaspoon -- Oral -  Pudding Cup -- Oral - Honey Teaspoon -- Oral - Honey Cup -- Oral - Nectar Teaspoon Premature spillage;Left anterior bolus loss;Right anterior bolus loss;Weak lingual manipulation;Delayed oral transit Oral - Nectar Cup Delayed oral transit;Weak lingual manipulation;Right anterior bolus loss;Left anterior bolus loss;Premature spillage Oral - Nectar Straw -- Oral - Thin Teaspoon Premature spillage;Weak lingual manipulation;Left anterior bolus loss;Right anterior bolus loss;Delayed oral transit Oral - Thin Cup -- Oral - Thin Straw -- Oral - Puree Weak lingual manipulation;Delayed oral transit;Premature spillage Oral - Mech Soft -- Oral - Regular -- Oral - Multi-Consistency -- Oral - Pill -- Oral Phase - Comment --  CHL IP PHARYNGEAL PHASE 11/14/2020 Pharyngeal Phase -- Pharyngeal- Pudding Teaspoon -- Pharyngeal -- Pharyngeal- Pudding Cup -- Pharyngeal -- Pharyngeal- Honey Teaspoon -- Pharyngeal -- Pharyngeal- Honey Cup -- Pharyngeal -- Pharyngeal- Nectar Teaspoon Delayed swallow initiation-vallecula;Pharyngeal residue - valleculae Pharyngeal -- Pharyngeal- Nectar Cup Delayed swallow initiation-vallecula;Pharyngeal residue - valleculae Pharyngeal -- Pharyngeal- Nectar Straw -- Pharyngeal -- Pharyngeal- Thin Teaspoon Delayed swallow initiation-vallecula;Pharyngeal residue - valleculae Pharyngeal -- Pharyngeal- Thin Cup Pharyngeal residue - valleculae;Delayed swallow initiation-vallecula;Reduced tongue base retraction Pharyngeal -- Pharyngeal- Thin Straw -- Pharyngeal -- Pharyngeal- Puree Pharyngeal residue - valleculae;Delayed swallow initiation-vallecula;Reduced tongue base retraction Pharyngeal -- Pharyngeal- Mechanical Soft -- Pharyngeal -- Pharyngeal- Regular -- Pharyngeal -- Pharyngeal- Multi-consistency -- Pharyngeal -- Pharyngeal- Pill -- Pharyngeal -- Pharyngeal Comment --  CHL IP CERVICAL ESOPHAGEAL PHASE 11/14/2020 Cervical Esophageal Phase WFL Pudding Teaspoon -- Pudding Cup -- Honey Teaspoon -- Honey Cup --  Nectar Teaspoon -- Nectar Cup -- Nectar Straw -- Thin Teaspoon -- Thin Cup -- Thin Straw -- Puree -- Mechanical Soft -- Regular -- Multi-consistency -- Pill -- Cervical Esophageal Comment -- Sonia Baller, MA, CCC-SLP Speech Therapy [facility]              Cardiac Studies     Patient Profile     59 y.o. male with ESRD on HD, HTN, tobacco abuse, CAD  presenting with anterior STEMI 10/28/20 s/p placement of a stent in the LAD with residual disease in the RCA and Circumflex. He developed atrial fib with RVR. Hospital course complicated by MSSA bacteremia with shock, upper GI bleeding secondary to severe esophagitis and duodenal ulcer, ICU delirium, metabolic encephalopathy, acute hypoxemic respiratory failure, anemia.   Assessment & Plan    1) CAD/Anterior STEMI: No chest pain. Continue statin and Brilinta. Plan for staged PCI of the RCA when other issues stable.    2) ESRD: Dialysis per Nephrology  3) Shock: Multi-factorial. BP stable off of pressors today  4) Atrial fibrillation: Sinus today. Continue oral amiodarone. Not a candidate for anti-coagulation.    5) Ischemic cardiomyopathy/Acute on chronic systolic CHF: ZHYQ=65-78% by echo 11/01/20. He remains volume overloaded. Fluid removal with HD.  -would add a beta blocker now that his  BP is stable. (Coreg 3.25 mg po BID)  6. Anemia: H/H still falling. He received 2 units pRBCs yesterday with HD. GI following.    For questions or updates, please contact [place] Please consult [email or web address] for contact info under        Signed, Lauree Chandler, MD  11/16/2020, 7:10 AM

## 2020-11-16 NOTE — Procedures (Signed)
Insertion of Chest Tube Procedure Note  Travis [name]  [id number]  12-13-1960  Date:11/16/20  Time:3:58 PM    Provider Performing: Clementeen Graham   Procedure: Pleural Catheter Insertion w/ Imaging Guidance [phone])  Indication(s) Effusion  Consent Risks of the procedure as well as the alternatives and risks of each were explained to the patient and/or caregiver.  Consent for the procedure was obtained and is signed in the bedside chart  Anesthesia Topical only with 1% lidocaine    Time Out Verified patient identification, verified procedure, site/side was marked, verified correct patient position, special equipment/implants available, medications/allergies/relevant history reviewed, required imaging and test results available.   Sterile Technique Maximal sterile technique including full sterile barrier drape, hand hygiene, sterile gown, sterile gloves, mask, hair covering, sterile ultrasound probe cover (if used).   Procedure Description Ultrasound used to identify appropriate pleural anatomy for placement and overlying skin marked. Area of placement cleaned and draped in sterile fashion.  A 14 French pigtail pleural catheter was placed into the right pleural space using Seldinger technique. Appropriate return of bloody  fluid was obtained.  The tube was connected to atrium and placed on -20 cm H2O wall suction.  Complications/Tolerance None; patient tolerated the procedure well. Chest X-ray is ordered to verify placement.   EBL Minimal  Specimen(s) fluid

## 2020-11-16 NOTE — Progress Notes (Signed)
Nutrition Follow-up  DOCUMENTATION CODES:   Severe malnutrition in context of chronic illness  INTERVENTION:   Magic cup TID with meals, each supplement provides 290 kcal and 9 grams of protein  Tube Feeding via Cortrak:  Vital 1.5 @ 55 ml/hr Begin TF at 20 ml/hr; titrate by 10 mL q 8 hours until goal rate of 55 ml/hr 90 ml ProSource TF QID Provides: 2300 kcal, 177 grams protein, and 1003 ml free water.  Add Juven BID, each packet provides 80 calories, 8 grams of carbohydrate, 2.5  grams of protein (collagen), 7 grams of L-arginine and 7 grams of L-glutamine; supplement contains CaHMB, Vitamins C, E, B12 and Zinc to promote wound healing  Renal MVI daily  Plan to transition to nocturnal feedings based on diet tolerance and po intake   NUTRITION DIAGNOSIS:   Severe Malnutrition related to chronic illness as evidenced by severe muscle depletion,severe fat depletion.  Being addressed via nutrition support, diet  GOAL:   Patient will meet greater than or equal to 90% of their needs  Progressing  MONITOR:   I & O's,Labs,Skin  REASON FOR ASSESSMENT:   Ventilator    ASSESSMENT:   Patient with PMH significant for HTN and ESRD in HD. Presents this admission with STEMI.   11/20 PCI to LAD 11/23Intubated 11/24 OG tube in esophagus, unable to advance 11/26 Cortrak attempted but unable to advance past 55 cm, likely at GE junction. DR advanced to duodenal bulb; TF initiated 11/30 Brown malodorous output from mouth and OG, TF held, CT negative for obstruction, ileus with no BM x 10 days. Abd xray with OG tube GE junction and Cortrak now retraced back into stomach; both needing advancement 12/01 iHD not tolerated, CRRT to resume, +large amount of liquid stool post enema, Attempted Cortrak advancement butunsuccessful 12/2 pt with large volume blood from OG tube; s/p EGD with noted severe esophagitis and erosions/ulcerations in duodenal bulb compatible with ischemia (probable  ischemic duodenitis)  12/3 extubated; attempted cortrak placement unsuccessful 12/9 Chest tube inserted, SLP advanced diet to Dys 3/Nectar thick  Pt sitting in chair on visit today, leaning to the right side and asleep. Daughter at bedside. Pt is very weak and deconditioned.   Pt has been receiving scheduled iHD, fistula clotting issues with plan for fistulogram with possible intervention tomorrow  Diet advanced to Dysphagia I, Nectar Thick post SLP evaluation today. Pt had not eaten anything but applesauce yet on visit today. Noted pt with previous EGD with severe esophagitis, not complaining of pain with swallowing at present  Tolerating Vital 1.5 at 55 ml/hr with Pro-source via Cortrak  Pt with significant wound on sacrum, stage III vs stage IV; complains of pain in areas  GI continues with possible GI bleed; Hgb dropping. stool via rectal tube.   Labs: sodium 133 (L), phosphorus 5.4 (H) Meds: ss novolog, renvela, hectoral, B-complex with C  Diet Order:   Diet Order            Diet NPO time specified Except for: Sips with Meds  Diet effective midnight           DIET - DYS 1 Room service appropriate? No; Fluid consistency: Nectar Thick  Diet effective now                 EDUCATION NEEDS:   Not appropriate for education at this time  Skin:  Skin Assessment: (P) Skin Integrity Issues: (skin tear vs pressure injury to sacrum noted today, not yet documented by  RN) Skin Integrity Issues:: Stage IV Stage II: n/a Stage IV: buttocks  Last BM:  12/9  Height:   Ht Readings from Last 1 Encounters:  10/28/20 5\' 11"  (1.803 m)    Weight:   Wt Readings from Last 1 Encounters:  11/16/20 80.7 kg    BMI:  Body mass index is 24.81 kg/m.  Estimated Nutritional Needs:   Kcal:  2250-2500  Protein:  160-197 grams  Fluid:  1000 mL plus UOP   Kerman Passey MS, RDN, LDN, CNSC Registered Dietitian III Clinical Nutrition RD Pager and On-Call Pager Number Located in [place]

## 2020-11-16 NOTE — Progress Notes (Addendum)
[facility] NEPHROLOGY PROGRESS NOTE  Assessment/ Plan: Pt is a 59 y.o. yo male HTN, ESRD on HD admitted on 11/20 with fever, myalgia found to have NSTEMI, complicated by A. fib with RVR, bacteremia, GI bleed and VDRF.  OP HD:AF MWF 4h 450/800 81.5kg 2/2.25 bath RUE AVF Hep 5000+ 1000 mid-run - hect 5 ug tiw  - mircera 50 ugIVq 4 weeks, last 11/15 (due 11/29)  # ESRD - usual HD MWF.  Required CRRT on and off from 11/24-12/4.  Tolerating intermittent hemodialysis, last HD yesterday with 2 L UF.  Plan for next dialysis tomorrow.  AV fistula had clots therefore needed to switch to catheter for dialysis.  I will consult IR to see if we can declot the fistula.  Unable to anticoagulate because of bleeding.  # Acute STEMI- S/P LAD PCI11/20, has otherdisease inLCx/ RCA.Transitioned to brilinta from cangrelor. Per cardiology.  #MSSA cavitary PNA / bacteremia-intubated 11/23. Per PCCM. Respiratory culture 11/23 with MSSA. Blood cx +11/21.CT abd/ pelvis negative 11/23.F/U bcx's negative.Dialysis duplex 11/22 without abscessandUS of AVF wasnegative 11/27. Plan for 6 weeks of IV cefazolin per pharmacy ending 12/30/2020 .  #PEA arrest: occurred in setting of agitation/ sedation.Brief CPR.  # Shock - resolved, now on midodrine.  #Anemia ckd/ABLA - S/P PRBCs.  Hemoglobin drifting down, off of anticoagulation.  Received blood transfusion, seen by GI.  #Secondary hyperparathyroidism: Increased sevelamer.  Monitor phosphorus level.  #Hyponatremia now managed with dialysis.  #Afib:on amio and hep gtt, s/p DCCV.   #Severe MR:per cardiology.  Subjective: Seen and examined ICU.  Had access clotted yesterday.  He is alert awake.  Tolerated dialysis via catheter.  Hemoglobin still trending down. Objective Vital signs in last 24 hours: Vitals:   11/16/20 0700 11/16/20 0759 11/16/20 0800 11/16/20 0900  BP:   132/75 126/85  Pulse: (!) 104  100 (!) 107   Resp: (!) 39  (!) 34 19  Temp:  100 F (37.8 C)    TempSrc:  Oral    SpO2: 96%  97% 95%  Weight:      Height:       Weight change: -1.8 kg  Intake/Output Summary (Last 24 hours) at 11/16/2020 0917 Last data filed at 11/16/2020 8841 Gross per 24 hour  Intake 1635.59 ml  Output 2851 ml  Net -1215.41 ml       Labs: Basic Metabolic Panel: Recent Labs  Lab 11/14/20 0413 11/15/20 0508 11/16/20 0538  NA 132* 130* 133*  K 3.9 4.2 3.9  CL 98 98 97*  CO2 20* 19* 24  GLUCOSE 122* 109* 121*  BUN 63* 102* 79*  CREATININE 6.61* 8.06* 6.91*  CALCIUM 8.8* 8.9 9.1  PHOS 5.7* 5.9* 5.4*   Liver Function Tests: Recent Labs  Lab 11/14/20 0413 11/15/20 0508 11/16/20 0538  ALBUMIN 1.1* 1.1* 1.2*   No results for input(s): LIPASE, AMYLASE in the last 168 hours. No results for input(s): AMMONIA in the last 168 hours. CBC: Recent Labs  Lab 11/13/20 0023 11/13/20 0453 11/14/20 0413 11/14/20 1218 11/15/20 0508 11/15/20 1310 11/16/20 0538  WBC 12.7*  --  12.0* 9.5 8.3  --  5.9  HGB 8.2*   < > 7.7* 8.8* 7.0* 8.7* 7.4*  HCT 25.5*   < > 24.5* 26.4* 21.2* 26.2* 23.3*  MCV 92.7  --  94.2 91.7 92.2  --  91.4  PLT 227  --  227 228 206  --  165   < > = values in this interval not  displayed.   Cardiac Enzymes: No results for input(s): CKTOTAL, CKMB, CKMBINDEX, TROPONINI in the last 168 hours. CBG: Recent Labs  Lab 11/15/20 1147 11/15/20 1531 11/15/20 2009 11/15/20 2338 11/16/20 0334  GLUCAP 88 111* 104* 89 94    Iron Studies: No results for input(s): IRON, TIBC, TRANSFERRIN, FERRITIN in the last 72 hours. Studies/Results: CT ABDOMEN PELVIS WO CONTRAST  Result Date: 11/15/2020 CLINICAL DATA:  59 year old male with bacteremia last month, end stage renal disease on dialysis. Severe esophagitis and duodenal ulcers on recent EGD. Continued drop in hemoglobin with liquid black stool suspicious for ongoing upper GI blood loss. EXAM: CT ABDOMEN AND PELVIS WITHOUT CONTRAST  TECHNIQUE: Multidetector CT imaging of the abdomen and pelvis was performed following the standard protocol without IV contrast. COMPARISON:  CT Abdomen and Pelvis 11/07/2020. FINDINGS: Lower chest: Increased layering pleural effusions at the lung bases, small. Continued bilateral lower lobe consolidation, although more suggestive of atelectasis than pneumonia on previous contrast enhanced CT. Overall stable lung base ventilation. Stable mild cardiomegaly. No pericardial effusion. Hepatobiliary: Contracted gallbladder now with mild pericholecystic fluid and/or gallbladder wall thickening (series 3, image 32). No other regional inflammation. Negative noncontrast liver. Pancreas: Negative noncontrast pancreas. Spleen: Negative. Adrenals/Urinary Tract: No retroperitoneal hematoma. Stable native left renal atrophy. Absent right kidney. Thickening of the adrenal glands is stable compatible with adrenal hyperplasia. Completely decompressed urinary bladder as before. Stomach/Bowel: Rectal tube in place. Intermittent diverticulosis of the large bowel, moderate overall with most segments affected. Some residual oral contrast within the large bowel and especially diverticula. No large bowel inflammation identified. Normal appendix (series 3, image 52). No dilated small bowel.  No free air or free fluid. Enteric tube terminates at the gastric antrum. Unremarkable visible distal esophagus, stomach and duodenum. Vascular/Lymphatic: Widespread calcified atherosclerosis, including involvement of the aorta. Normal caliber abdominal aorta. Vascular patency is not evaluated in the absence of IV contrast. No lymphadenopathy. Reproductive: Negative. Other: Generalized increased subcutaneous body wall edema since November (series 3, image 49 today). Similar increased but mild presacral stranding. No pelvic free fluid. Musculoskeletal: Lower thoracic spina bifida occulta (normal variant). 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 No acute osseous  abnormality identified. IMPRESSION: 1. Negative for retroperitoneal bleeding or other hematoma collection in the abdomen or pelvis. 2. A degree of anasarca has developed since 11/07/2020, including new body wall edema and small layering pleural effusions. Contracted gallbladder with new mild pericholecystic fluid and/or wall edema also suspected due to anasarca. 3. Enteric tube terminates in the distal stomach. Rectal tube in place. No bowel inflammation is evident. Widespread large bowel diverticulosis. 4. Continued bilateral lower lobe lung consolidation, although more suggestive of atelectasis than pneumonia on prior CT. 5. Aortic Atherosclerosis (ICD10-I70.0). Electronically Signed   By: Genevie Ann M.D.   On: 11/15/2020 23:45   DG Chest 1 View  Result Date: 11/14/2020 CLINICAL DATA:  Congestion, possible aspiration EXAM: CHEST  1 VIEW COMPARISON:  Portable exam 1601 hours compared to 11/08/2020 FINDINGS: Feeding tube extends into the stomach. RIGHT jugular line tip projects over RIGHT atrium unchanged. LEFT subclavian central venous catheter tip projects over LEFT brachiocephalic vein near SVC confluence unchanged. Enlargement of cardiac silhouette. Moderate partially loculated RIGHT pleural effusion. Bibasilar consolidation versus atelectasis. Upper lungs clear. No pneumothorax. IMPRESSION: Partially loculated RIGHT pleural effusion. Enlargement of cardiac silhouette. Atelectasis versus consolidation of the lower lobes. Electronically Signed   By: Lavonia Dana M.D.   On: 11/14/2020 19:00   DG Swallowing Func-Speech Pathology  Result Date: 11/14/2020 Objective Swallowing Evaluation:  Type of Study: MBS-Modified Barium Swallow Study  Patient Details Name: Travis [name] MRN: [id number] Date of Birth: 22-Jan-1961 Today's Date: 11/14/2020 Time: SLP Start Time (ACUTE ONLY): 1400 -SLP Stop Time (ACUTE ONLY): 1425 SLP Time Calculation (min) (ACUTE ONLY): 25 min Past Medical History: Past Medical History: Diagnosis  Date . Acute ST elevation myocardial infarction (STEMI) of anterolateral wall ([place]) 10/28/2020  Culprit lesion, 99% ulcerated mid LAD at D1 -> DES PCI . Anemia of chronic disease 01/30/2015 . Coronary artery disease involving native coronary artery of native heart with unstable angina pectoris ([place]) 10/28/2020  Acute anterolateral STEMI: 3V CAD: Culprit Lesion = 99% mid LAD lesion (just after D1) - DES PCI; 99% ostial AV groove LCx; & 80% calcified napkin ring proximal RCA lesion with extensive calcification throughout the RCA. Successful PTCA and DES PCI of the LAD crossing D1 -resolute Onyx DES 2.75 mm x 18 mm postdilated to 3.1 mm. With PCI, there . End-stage renal disease on hemodialysis ([place])  . GERD (gastroesophageal reflux disease)   takes Nexium . Hyperlipidemia with target LDL less than 70 10/28/2020 . Hypertension  . Pneumonia   as a child . 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 Sleep apnea   uses c-pap 2 yrs Past Surgical History: Past Surgical History: Procedure Laterality Date . AV FISTULA PLACEMENT Left 12/22/2017  Procedure: REPAIR PSEUDOANEURYSM ARTERIOVENOUS (AV) FISTULA;  Surgeon: [name], MD;  Location: [place];  Service: Vascular;  Laterality: Left; . BASCILIC VEIN TRANSPOSITION Left 01/05/2014  Procedure: LEFT 1ST STAGE BASCILIC VEIN TRANSPOSITION;  Surgeon: Conrad Windsor, MD;  Location: [place];  Service: Vascular;  Laterality: Left; . BASCILIC VEIN TRANSPOSITION Left 07/26/2014  Procedure: Left Arm Brachial Vein Transposition Second Stage;  Surgeon: Conrad Madelia, MD;  Location: [place];  Service: Vascular;  Laterality: Left; . COLONOSCOPY   . COLONOSCOPY N/A 02/02/2015  Procedure: COLONOSCOPY;  Surgeon: [name], MD;  Location: [facility];  Service: Endoscopy;  Laterality: N/A; . CORONARY STENT INTERVENTION N/A 10/28/2020  Procedure: CORONARY STENT INTERVENTION;  Surgeon: Leonie Man,  MD;  Location: [place] [facility];  Service: Cardiovascular;  Laterality: N/A; . CORONARY/GRAFT ACUTE MI REVASCULARIZATION N/A 10/28/2020  Procedure: Coronary/Graft Acute MI Revascularization;  Surgeon: Leonie Man, MD;  Location: [place] [facility];  Service: Cardiovascular;  Laterality: N/A; . ESOPHAGOGASTRODUODENOSCOPY Left 01/31/2015  Procedure: ESOPHAGOGASTRODUODENOSCOPY (EGD);  Surgeon: [name], MD;  Location: [facility];  Service: Endoscopy;  Laterality: Left; . ESOPHAGOGASTRODUODENOSCOPY N/A 11/09/2020  Procedure: ESOPHAGOGASTRODUODENOSCOPY (EGD);  Surgeon: Clarene Essex, MD;  Location: [place];  Service: Endoscopy;  Laterality: N/A; . EVALUATION UNDER ANESTHESIA WITH HEMORRHOIDECTOMY N/A 02/26/2018  Procedure: ANORECTAL EXAM UNDER ANESTHESIA  HEMORRHOIDECTOMY x 2  HEMORRHOIDAL LIGATION AND PEXY;  Surgeon: Javarie Boston, MD;  Location: [facility];  Service: General;  Laterality: N/A; . GIVENS CAPSULE STUDY N/A 01/31/2015  Procedure: GIVENS CAPSULE STUDY;  Surgeon: [name], MD;  Location: [facility] END[facility]COPY;  Service: Endoscopy;  Laterality: N/A; . LEFT HEART CATH AND CORONARY ANGIOGRAPHY N/A 10/28/2020  Procedure: LEFT HEART CATH AND CORONARY ANGIOGRAPHY;  Surgeon: Leonie Man, MD;  Location: [place] [facility];  Service: Cardiovascular;  Laterality: N/A; . MOUTH SURGERY    teeth cleaning . NEPHRECTOMY RADICAL Right 05/2015  [facility] Dr [name] for R renal mass HPI: 59 yo male with PMH significant for HTN, dilated ascending aorta 4.5cm,  ESRD on HD via RUE AV fistula and smoking hx presents with sudden onset of chest discomfort strating at 5am 10/28/2020. He describes his chest tightness, 7/10, with radiation to his back (between his scapula), he has not exerted. EKG in ED is suggestive of anterolateral ST elevations. In the ED he was given thorazine for hiccups; CXR 10/31/20 indicated New patchy right mid and lower lung infiltrate; CT chest 10/31/20 yielded results including: Extensive multifocal  nodular and patchy airspace disease in both lungs with a slight peripheral predominance in the upper lungs. 3.1 cm nodular consolidative opacity in the right upper lobe is cavitated. Imaging features likely related to multifocal pneumonia. Pt intubated 11/23-12/3/21; gastroenterology consult 11/09/20 with UGI completed and results as follows: Moderately severe reflux and erosive esophagitis  with no bleeding; Hematin (altered blood/coffee-ground-like material) in the gastric antrum, in the cardia, in  the gastric fundus and in the gastric body. Fluid aspiration performed. Probable ischemic duodenitis. Normal third portion of the duodenum. The examination was otherwise normal. BSE generated.  Subjective: patient awake and alert but did not attempt to verbalize, was having difficulty sitting up and was unable to keep head up during study Assessment / Plan / Recommendation CHL IP CLINICAL IMPRESSIONS 11/14/2020 Clinical Impression Patient presents with a moderate oropharyngeal dysphagia with likely impact from current cognitive status and pain leading to difficulty maintaining adequate posture and participation during MBS. Patient kept head in a downward, almost chin tuck posture and was not able to raise head despite tactile and physical cues. Oral delay in transit led to premature spillage into vallecular sinus, and swallow initiation occuring at level of vallecular sinus. With puree bolus, patient exhibited more significant delay in swallow initiation and had mild-mod vallecular residuals post initial swallow. No penetration or aspiration observed and no significant amount of pyriform sinus residuals was observed. UES opening appeared [facility] and without evidence of retrograde movement of boluses. Currently, patient is not safe for full PO's, but this is largely due to his inability to maintain adequate participation combined with his overall weakness. Recommendation is remain NPO but allow for SLP to assess patient  toleration of trials of thin liquids, nectar thick liquids, puree solids at bedside. SLP Visit Diagnosis Dysphagia, oropharyngeal phase (R13.12) Attention and concentration deficit following -- Frontal lobe and executive function deficit following -- Impact on safety and function Risk for inadequate nutrition/hydration;Mild aspiration risk;Moderate aspiration risk   CHL IP TREATMENT RECOMMENDATION 11/14/2020 Treatment Recommendations Therapy as outlined in treatment plan below   Prognosis 11/14/2020 Prognosis for Safe Diet Advancement Good Barriers to Reach Goals -- Barriers/Prognosis Comment -- CHL IP DIET RECOMMENDATION 11/14/2020 SLP Diet Recommendations NPO;Other (Comment) Liquid Administration via -- Medication Administration Via alternative means Compensations -- Postural Changes --   CHL IP OTHER RECOMMENDATIONS 11/14/2020 Recommended Consults -- Oral Care Recommendations Oral care QID Other Recommendations --   CHL IP FOLLOW UP RECOMMENDATIONS 11/14/2020 Follow up Recommendations 24 hour supervision/assistance;Skilled Nursing facility;Inpatient Rehab   CHL IP FREQUENCY AND DURATION 11/14/2020 Speech Therapy Frequency (ACUTE ONLY) min 2x/week Treatment Duration 1 week      CHL IP ORAL PHASE 11/14/2020 Oral Phase -- Oral - Pudding Teaspoon -- Oral - Pudding Cup -- Oral - Honey Teaspoon -- Oral - Honey Cup -- Oral - Nectar Teaspoon Premature spillage;Left anterior bolus loss;Right anterior bolus loss;Weak lingual manipulation;Delayed oral transit Oral - Nectar Cup Delayed oral transit;Weak lingual manipulation;Right anterior bolus loss;Left anterior bolus loss;Premature spillage Oral - Nectar Straw -- Oral - Thin Teaspoon Premature  spillage;Weak lingual manipulation;Left anterior bolus loss;Right anterior bolus loss;Delayed oral transit Oral - Thin Cup -- Oral - Thin Straw -- Oral - Puree Weak lingual manipulation;Delayed oral transit;Premature spillage Oral - Mech Soft -- Oral - Regular -- Oral - Multi-Consistency --  Oral - Pill -- Oral Phase - Comment --  CHL IP PHARYNGEAL PHASE 11/14/2020 Pharyngeal Phase -- Pharyngeal- Pudding Teaspoon -- Pharyngeal -- Pharyngeal- Pudding Cup -- Pharyngeal -- Pharyngeal- Honey Teaspoon -- Pharyngeal -- Pharyngeal- Honey Cup -- Pharyngeal -- Pharyngeal- Nectar Teaspoon Delayed swallow initiation-vallecula;Pharyngeal residue - valleculae Pharyngeal -- Pharyngeal- Nectar Cup Delayed swallow initiation-vallecula;Pharyngeal residue - valleculae Pharyngeal -- Pharyngeal- Nectar Straw -- Pharyngeal -- Pharyngeal- Thin Teaspoon Delayed swallow initiation-vallecula;Pharyngeal residue - valleculae Pharyngeal -- Pharyngeal- Thin Cup Pharyngeal residue - valleculae;Delayed swallow initiation-vallecula;Reduced tongue base retraction Pharyngeal -- Pharyngeal- Thin Straw -- Pharyngeal -- Pharyngeal- Puree Pharyngeal residue - valleculae;Delayed swallow initiation-vallecula;Reduced tongue base retraction Pharyngeal -- Pharyngeal- Mechanical Soft -- Pharyngeal -- Pharyngeal- Regular -- Pharyngeal -- Pharyngeal- Multi-consistency -- Pharyngeal -- Pharyngeal- Pill -- Pharyngeal -- Pharyngeal Comment --  CHL IP CERVICAL ESOPHAGEAL PHASE 11/14/2020 Cervical Esophageal Phase WFL Pudding Teaspoon -- Pudding Cup -- Honey Teaspoon -- Honey Cup -- Nectar Teaspoon -- Nectar Cup -- Nectar Straw -- Thin Teaspoon -- Thin Cup -- Thin Straw -- Puree -- Mechanical Soft -- Regular -- Multi-consistency -- Pill -- Cervical Esophageal Comment -- Sonia Baller, MA, CCC-SLP Speech Therapy [facility] Acute [facility]hab              Medications: Infusions: . sodium chloride Stopped (11/10/20 1944)  . sodium chloride Stopped (10/28/20 0954)  . sodium chloride Stopped (11/04/20 0016)  . sodium chloride    . sodium chloride Stopped (11/14/20 1600)  .  ceFAZolin (ANCEF) IV 2 g (11/15/20 1228)  . dexmedetomidine (PRECEDEX) IV infusion Stopped (11/14/20 1103)  . feeding supplement (VITAL 1.5 CAL) 55 mL/hr at 11/16/20 0701  . norepinephrine  (LEVOPHED) Adult infusion Stopped (11/13/20 0428)  . pantoprozole (PROTONIX) infusion 8 mg/hr (11/16/20 0701)    Scheduled Medications: . sodium chloride   Intravenous Once  . amiodarone  200 mg Per Tube Daily  . atorvastatin  80 mg Per Tube q1800  . B-complex with vitamin C  1 tablet Per Tube Daily  . chlorhexidine  15 mL Mouth Rinse BID  . Chlorhexidine Gluconate Cloth  6 each Topical Q0600  . clonazepam  1 mg Per Tube BID  . collagenase   Topical Daily  . doxercalciferol  5 mcg Intravenous Q M,W,F  . feeding supplement (PROSource TF)  90 mL Per Tube QID  . guaiFENesin  15 mL Per Tube Q4H  . insulin aspart  0-6 Units Subcutaneous Q4H  . lidocaine  1 patch Transdermal Q24H  . mouth rinse  15 mL Mouth Rinse q12n4p  . midodrine  10 mg Per Tube TID WC  . [START ON 11/18/2020] pantoprazole  40 mg Intravenous Q12H  . QUEtiapine  50 mg Per Tube BID  . sevelamer carbonate  1.6 g Per Tube TID WC  . sodium chloride flush  10-40 mL Intracatheter Q12H  . sodium chloride flush  3 mL Intravenous Q12H  . sucralfate  1 g Per Tube QID  . ticagrelor  90 mg Per Tube BID    have reviewed scheduled and prn medications.  Physical Exam: General: Ill-looking male lying on bed with high flow oxygen, alert and following simple commands. Heart:RRR, s1s2 nl Lungs: Basal rhonchi, no wheezing Abdomen:soft, Non-tender, non-distended Extremities:No edema Dialysis  Access: Has catheter,  AV fistula has some bruit and thrill.  Travis [name] 11/16/2020,9:17 AM  LOS: 19 days  Pager: [phone]

## 2020-11-16 NOTE — Progress Notes (Signed)
  Speech Language Pathology Treatment: Dysphagia  Patient Details Name: Travis [name] MRN: [id number] DOB: [birth year] Today's Date: 11/16/2020 Time: 5038-8828 SLP Time Calculation (min) (ACUTE ONLY): 14 min  Assessment / Plan / Recommendation Clinical Impression  Pt up in chair, alert and appropriately but very weak and uncomfortable due to sacral wound. Pt able to attend to PO trials with subjective appearance of timely swallow. No signs of aspiration with nectar and puree, as recommended by MBS. Demonstrated feeding to daughter. Pt able to participate in some hand over hand assisted cup sips as well. Will initiate a puree and nectar thick diet and advance base on tolerance. Also consider pts esophagitis in f/u assessments.   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 He describes his chest tightness, 7/10, with radiation to his back (between his scapula), he has not exerted. EKG in ED is suggestive of anterolateral ST elevations. In the ED he was given thorazine for hiccups; CXR 10/31/20 indicated New patchy right mid and lower lung infiltrate; CT chest 10/31/20 yielded results including: Extensive multifocal nodular and patchy airspace disease in both lungs with a slight peripheral predominance in the upper lungs. 3.1 cm nodular consolidative opacity in the right upper lobe is cavitated. Imaging features likely related to multifocal pneumonia. Pt intubated 11/23-12/3/21; gastroenterology consult 11/09/20 with UGI completed and results as follows: Moderately severe reflux and erosive esophagitis  with no bleeding; Hematin (altered blood/coffee-ground-like material) in the gastric antrum, in the cardia, in  the gastric fundus and in the gastric body. Fluid aspiration performed. Probable ischemic duodenitis. Normal third portion of the duodenum.  The examination was otherwise normal. BSE generated.      SLP Plan  Continue with current plan of care       Recommendations  Diet recommendations: Dysphagia 1 (puree);Nectar-thick liquid Liquids provided via: Cup;Teaspoon Medication Administration: Crushed with puree Supervision: Full supervision/cueing for compensatory strategies;Staff to assist with self feeding;Trained caregiver to feed patient Compensations: Slow rate;Small sips/bites Postural Changes and/or Swallow Maneuvers: Seated upright 90 degrees                General recommendations: Rehab consult Follow up Recommendations: 24 hour supervision/assistance;Skilled Nursing facility;Inpatient Rehab SLP Visit Diagnosis: Dysphagia, oropharyngeal phase (R13.12) Plan: Continue with current plan of care       GO                Vernesha Talbot, Katherene Ponto 11/16/2020, 11:14 AM

## 2020-11-16 NOTE — Progress Notes (Signed)
Physical Therapy Wound Treatment Patient Details  Name: Travis [name] MRN: [id number] Date of Birth: [birth year]  Today's Date: 11/16/2020 Time: 7078-6754 Time Calculation (min): 50 min  Subjective  Subjective: Agreeable to hydrotherapy Patient and Family Stated Goals: None stated Prior Treatments: Dressing changes  Pain Score: Pt appeared uncomfortable throughout session. Shakes head "no" when asked if painful, but nodded head "yes" when asked if he was just cold.   Wound Assessment  Pressure Injury 11/14/20 Buttocks Mid;Right;Left Unstageable - Full thickness tissue loss in which the base of the injury is covered by slough (yellow, tan, gray, green or brown) and/or eschar (tan, brown or black) in the wound bed. (Active)  Dressing Type ABD;Barrier Film (skin prep);Gauze (Comment);Moist to dry 11/16/20 1515  Dressing Changed;Clean;Dry;Intact 11/16/20 1515  Dressing Change Frequency Daily 11/16/20 1515  State of Healing Eschar 11/16/20 1515  Site / Wound Assessment Painful;Pink;Yellow;Brown 11/16/20 1515  % Wound base Red or Granulating 50% 11/16/20 1515  % Wound base Yellow/Fibrinous Exudate 15% 11/16/20 1515  % Wound base Black/Eschar 35% 11/16/20 1515  % Wound base Other/Granulation Tissue (Comment) 0% 11/16/20 1515  Peri-wound Assessment Intact 11/16/20 1515  Wound Length (cm) 11 cm 11/14/20 1100  Wound Width (cm) 17 cm 11/14/20 1100  Wound Depth (cm) 0.1 cm 11/14/20 1100  Wound Surface Area (cm^2) 187 cm^2 11/14/20 1100  Wound Volume (cm^3) 18.7 cm^3 11/14/20 1100  Margins Unattached edges (unapproximated) 11/16/20 1515  Drainage Amount Minimal 11/16/20 1515  Drainage Description Serosanguineous 11/16/20 1515  Treatment Debridement (Selective);Hydrotherapy (Pulse lavage);Packing (Saline gauze) 11/16/20 1515   Santyl applied to wound bed prior to applying dressing.     Hydrotherapy Pulsed lavage therapy - wound location: Buttocks Pulsed Lavage with Suction (psi): 12  psi Pulsed Lavage with Suction - Normal Saline Used: 1000 mL Pulsed Lavage Tip: Tip with splash shield Selective Debridement Selective Debridement - Location: Buttocks Selective Debridement - Tools Used: Forceps;Scalpel;Scissors Selective Debridement - Tissue Removed: Eschar   Wound Assessment and Plan  Wound Therapy - Assess/Plan/Recommendations Wound Therapy - Clinical Statement: Progressing with debridement of necrotic tissue. Some bleeding noted this session with Silver Nitrate utilized to control bleeding. This patient will benefit from continued hydrotherapy for selective removal of unviable tissue, to decrease bioburden, and promote wound bed healing. Wound Therapy - Functional Problem List: Global weakness s/p ICU stay Factors Delaying/Impairing Wound Healing: Immobility;Multiple medical problems Hydrotherapy Plan: Debridement;Dressing change;Patient/family education;Pulsatile lavage with suction Wound Therapy - Frequency: 6X / week Wound Therapy - Follow Up Recommendations: Skilled nursing facility Wound Plan: See above  Wound Therapy Goals- Improve the function of patient's integumentary system by progressing the wound(s) through the phases of wound healing (inflammation - proliferation - remodeling) by: Decrease Necrotic Tissue to: 20% Decrease Necrotic Tissue - Progress: Progressing toward goal Increase Granulation Tissue to: 80% Increase Granulation Tissue - Progress: Progressing toward goal Goals/treatment plan/discharge plan were made with and agreed upon by patient/family: Yes Time For Goal Achievement: 7 days Wound Therapy - Potential for Goals: Good  Goals will be updated until maximal potential achieved or discharge criteria met.  Discharge criteria: when goals achieved, discharge from hospital, MD decision/surgical intervention, no progress towards goals, refusal/missing three consecutive treatments without notification or medical reason.  GP     Thelma Comp 11/16/2020, 3:18 PM   Rolinda Roan, PT, DPT Acute Rehabilitation Services Pager: [phone] Office: [phone]

## 2020-11-16 NOTE — Consult Note (Signed)
Chief Complaint: Patient was seen in consultation today for AVF malfunction/fistulogram with possible intervention.  Referring Physician(s): Rosita Fire (nephrology)  Supervising Physician: Arne Cleveland  Patient Status: [facility] - In-pt  History of Present Illness: Travis [name] is a 59 y.o. male with a past medical history of hypertension, hyperlipidemia, CAD s/p stent placement 10/2020, MI, pneumonia, GERD, ESRD on HD via LUE AVF, OSA, and prior tobacco use. He has been admitted to [facility] since 10/28/2020 for management of MI. He underwent PCI to LAD in OR 10/28/2020 by Dr. Wilhemina Cash. Hospital course complicated by septic shock, atrial fibrillation with RVR, ongoing GI bleed, PEA arrest requiring brief CPR, MSSA pneumonia/bactermia, and septic shock. Nephrology was consulted on admission due to known ESRD. Patient did require CRRT from 11/01/2020 to 11/11/2020 via nontunneled right IJ HD catheter- this is still in place. He underwent regular HD via LUE AVF yesterday, however per notes clots were pulled from AVF.  IR consulted by Dr. Carolin Sicks for possible image-guided fistulogram with possible intervention. Patient awake and alert sitting in chair with no complaints at this time. Daughter at bedside. Denies fever, chills, chest pain, dyspnea, abdominal pain, or headache.   Past Medical History:  Diagnosis Date  . Acute ST elevation myocardial infarction (STEMI) of anterolateral wall ([place]) 10/28/2020   Culprit lesion, 99% ulcerated mid LAD at D1 -> DES PCI  . Anemia of chronic disease 01/30/2015  . Coronary artery disease involving native coronary artery of native heart with unstable angina pectoris ([place]) 10/28/2020   Acute anterolateral STEMI: 3V CAD: Culprit Lesion = 99% mid LAD lesion (just after D1) - DES PCI; 99% ostial AV groove LCx; & 80% calcified napkin ring proximal RCA lesion with extensive calcification throughout the RCA. Successful PTCA and DES PCI of the LAD crossing  D1 -resolute Onyx DES 2.75 mm x 18 mm postdilated to 3.1 mm. With PCI, there  . End-stage renal disease on hemodialysis ([place])   . GERD (gastroesophageal reflux disease)    takes Nexium  . Hyperlipidemia with target LDL less than 70 10/28/2020  . Hypertension   . Pneumonia    as a child  . Presence of drug coated stent in LAD coronary artery 10/28/2020   Proximal-mid LAD 99% ->0% -> Resolute Onyx DES 2.75 mm x 18 mm (3.1 mm)  . Sleep apnea    uses c-pap 2 yrs    Past Surgical History:  Procedure Laterality Date  . AV FISTULA PLACEMENT Left 12/22/2017   Procedure: REPAIR PSEUDOANEURYSM ARTERIOVENOUS (AV) FISTULA;  Surgeon: [name], MD;  Location: [address];  Service: Vascular;  Laterality: Left;  . BASCILIC VEIN TRANSPOSITION Left 01/05/2014   Procedure: LEFT 1ST STAGE BASCILIC VEIN TRANSPOSITION;  Surgeon: Conrad [place], MD;  Location: [place];  Service: Vascular;  Laterality: Left;  . BASCILIC VEIN TRANSPOSITION Left 07/26/2014   Procedure: Left Arm Brachial Vein Transposition Second Stage;  Surgeon: Conrad [place], MD;  Location: [place];  Service: Vascular;  Laterality: Left;  . COLONOSCOPY    . COLONOSCOPY N/A 02/02/2015   Procedure: COLONOSCOPY;  Surgeon: [name], MD;  Location: [facility] ENDOSCOPY;  Service: Endoscopy;  Laterality: N/A;  . CORONARY STENT INTERVENTION N/A 10/28/2020   Procedure: CORONARY STENT INTERVENTION;  Surgeon: Leonie Man, MD;  Location: [place] CV LAB;  Service: Cardiovascular;  Laterality: N/A;  . CORONARY/GRAFT ACUTE MI REVASCULARIZATION N/A 10/28/2020   Procedure: Coronary/Graft Acute MI Revascularization;  Surgeon: Leonie Man, MD;  Location: [facility]  CV [facility];  Service: Cardiovascular;  Laterality: N/A;  . ESOPHAGOGASTRODUODENOSCOPY Left 01/31/2015   Procedure: ESOPHAGOGASTRODUODENOSCOPY (EGD);  Surgeon: [name], MD;  Location: [facility] ENDOSCOPY;  Service: Endoscopy;  Laterality: Left;  . ESOPHAGOGASTRODUODENOSCOPY N/A 11/09/2020   Procedure:  ESOPHAGOGASTRODUODENOSCOPY (EGD);  Surgeon: Clarene Essex, MD;  Location: [place];  Service: Endoscopy;  Laterality: N/A;  . EVALUATION UNDER ANESTHESIA WITH HEMORRHOIDECTOMY N/A 02/26/2018   Procedure: ANORECTAL EXAM UNDER ANESTHESIA  HEMORRHOIDECTOMY x 2  HEMORRHOIDAL LIGATION AND PEXY;  Surgeon: Braulio Boston, MD;  Location: [facility];  Service: General;  Laterality: N/A;  . GIVENS CAPSULE STUDY N/A 01/31/2015   Procedure: GIVENS CAPSULE STUDY;  Surgeon: [name], MD;  Location: [facility] ENDOSCOPY;  Service: Endoscopy;  Laterality: N/A;  . LEFT HEART CATH AND CORONARY ANGIOGRAPHY N/A 10/28/2020   Procedure: LEFT HEART CATH AND CORONARY ANGIOGRAPHY;  Surgeon: Leonie Man, MD;  Location: [place] CV LAB;  Service: Cardiovascular;  Laterality: N/A;  . MOUTH SURGERY     teeth cleaning  . NEPHRECTOMY RADICAL Right 05/2015   [facility] Dr [name] for R renal mass    Allergies: Ibuprofen, Lisinopril, and Naproxen  Medications: Prior to Admission medications   Medication Sig Start Date End Date Taking? Authorizing Provider  acetaminophen (TYLENOL) 325 MG tablet Take 650 mg by mouth every 6 (six) hours as needed for mild pain, fever or headache.   Yes [provider]  AURYXIA 1 GM 210 MG(Fe) tablet Take 210-630 mg by mouth See admin instructions. 630mg  three times daily with meals and 210-420mg  with snacks 10/03/17  Yes [provider]  cinacalcet (SENSIPAR) 60 MG tablet Take 120 mg by mouth daily. 10/14/20  Yes [provider]  multivitamin (RENA-VIT) TABS tablet Take 1 tablet by mouth daily. 10/13/20  Yes [provider]  Vitamin D, Ergocalciferol, (DRISDOL) 1.25 MG (50000 UNIT) CAPS capsule Take 50,000 Units by mouth once a week. 10/17/20  Yes [provider]  iron polysaccharides (NIFEREX) 150 MG capsule Take 1 capsule (150 mg total) by mouth 2 (two) times daily. Patient not taking: Reported on 10/28/2020 02/02/15   [name], MD  labetalol  (NORMODYNE) 300 MG tablet Take 1 tablet (300 mg total) by mouth 2 (two) times daily. Patient not taking: Reported on 10/28/2020 02/02/15   [name], MD     Family History  Problem Relation Age of Onset  . Hypertension Mother   . Hypertension Father   . Hypertension Sister     Social History   Socioeconomic History  . Marital status: Married    Spouse name: Not on file  . Number of children: Not on file  . Years of education: Not on file  . Highest education level: Not on file  Occupational History  . Not on file  Tobacco Use  . Smoking status: Former Smoker    Years: 4.00    Types: Cigars    Quit date: 12/31/2012    Years since quitting: 7.8  . Smokeless tobacco: Never Used  Vaping Use  . Vaping Use: Never used  Substance and Sexual Activity  . Alcohol use: No  . Drug use: No  . Sexual activity: Not on file  Other Topics Concern  . Not on file  Social History Narrative  . Not on file   Social Determinants of Health   Financial Resource Strain: Not on file  Food Insecurity: Not on file  Transportation Needs: Not on file  Physical Activity: Not on file  Stress: Not on  file  Social Connections: Not on file     Review of Systems: A 12 point ROS discussed and pertinent positives are indicated in the HPI above.  All other systems are negative.  Review of Systems  Constitutional: Negative for chills and fever.  Respiratory: Negative for shortness of breath and wheezing.   Cardiovascular: Negative for chest pain and palpitations.  Gastrointestinal: Negative for abdominal pain.  Neurological: Negative for headaches.  Psychiatric/Behavioral: Negative for behavioral problems and confusion.    Vital Signs: BP 105/69   Pulse 97   Temp 99.2 F (37.3 C) (Oral)   Resp (!) 38   Ht 5\' 11"  (1.803 m)   Wt 177 lb 14.6 oz (80.7 kg)   SpO2 96%   BMI 24.81 kg/m   Physical Exam Vitals and nursing note reviewed.  Constitutional:      General: He is not in acute  distress.    Appearance: He is ill-appearing.  Cardiovascular:     Rate and Rhythm: Normal rate and regular rhythm.     Heart sounds: Normal heart sounds. No murmur heard.     Comments: LUE AVF site without tenderness, erythema, edema, or active bleeding; no thrill palpable; no bruit heard on ascultation. Pulmonary:     Effort: Pulmonary effort is normal. No respiratory distress.     Breath sounds: Normal breath sounds. No wheezing.  Skin:    General: Skin is warm and dry.  Neurological:     Mental Status: He is alert and oriented to person, place, and time.      MD Evaluation Airway: WNL Heart: WNL Abdomen: WNL Chest/ Lungs: WNL Other Pertinent Findings: on HD. ASA  Classification: 3 Mallampati/Airway Score: Two   Imaging: CT ABDOMEN PELVIS WO CONTRAST  Result Date: 11/15/2020 CLINICAL DATA:  59 year old male with bacteremia last month, end stage renal disease on dialysis. Severe esophagitis and duodenal ulcers on recent EGD. Continued drop in hemoglobin with liquid black stool suspicious for ongoing upper GI blood loss. EXAM: CT ABDOMEN AND PELVIS WITHOUT CONTRAST TECHNIQUE: Multidetector CT imaging of the abdomen and pelvis was performed following the standard protocol without IV contrast. COMPARISON:  CT Abdomen and Pelvis 11/07/2020. FINDINGS: Lower chest: Increased layering pleural effusions at the lung bases, small. Continued bilateral lower lobe consolidation, although more suggestive of atelectasis than pneumonia on previous contrast enhanced CT. Overall stable lung base ventilation. Stable mild cardiomegaly. No pericardial effusion. Hepatobiliary: Contracted gallbladder now with mild pericholecystic fluid and/or gallbladder wall thickening (series 3, image 32). No other regional inflammation. Negative noncontrast liver. Pancreas: Negative noncontrast pancreas. Spleen: Negative. Adrenals/Urinary Tract: No retroperitoneal hematoma. Stable native left renal atrophy. Absent right  kidney. Thickening of the adrenal glands is stable compatible with adrenal hyperplasia. Completely decompressed urinary bladder as before. Stomach/Bowel: Rectal tube in place. Intermittent diverticulosis of the large bowel, moderate overall with most segments affected. Some residual oral contrast within the large bowel and especially diverticula. No large bowel inflammation identified. Normal appendix (series 3, image 52). No dilated small bowel.  No free air or free fluid. Enteric tube terminates at the gastric antrum. Unremarkable visible distal esophagus, stomach and duodenum. Vascular/Lymphatic: Widespread calcified atherosclerosis, including involvement of the aorta. Normal caliber abdominal aorta. Vascular patency is not evaluated in the absence of IV contrast. No lymphadenopathy. Reproductive: Negative. Other: Generalized increased subcutaneous body wall edema since November (series 3, image 49 today). Similar increased but mild presacral stranding. No pelvic free fluid. Musculoskeletal: Lower thoracic spina bifida occulta (normal variant). 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 No acute osseous abnormality identified. IMPRESSION: 1. Negative for retroperitoneal bleeding or other hematoma collection in the abdomen or pelvis. 2. A degree of anasarca has developed since 11/07/2020, including new body wall edema and small layering pleural effusions. Contracted gallbladder with new mild pericholecystic fluid and/or wall edema also suspected due to anasarca. 3. Enteric tube terminates in the distal stomach. Rectal tube in place. No bowel inflammation is evident. Widespread large bowel diverticulosis. 4. Continued bilateral lower lobe lung consolidation, although more suggestive of atelectasis than pneumonia on prior CT. 5. Aortic Atherosclerosis (ICD10-I70.0). Electronically Signed   By: Genevie Ann M.D.   On: 11/15/2020 23:45   CT ABDOMEN PELVIS WO CONTRAST  Result Date: 11/01/2020 CLINICAL DATA:  Anemia, dropping  hemoglobin, concern for retroperitoneal hematoma following cardiac catheterization EXAM: CT ABDOMEN AND PELVIS WITHOUT CONTRAST TECHNIQUE: Multidetector CT imaging of the abdomen and pelvis was performed following the standard protocol without IV contrast. COMPARISON:  None. FINDINGS: Lower chest: There are small bilateral pleural effusions and associated atelectasis or consolidation, with a significant amount of somewhat nodular heterogeneous airspace disease present in the right greater than left lower lobes. Cardiomegaly. Three-vessel coronary artery calcifications. Hepatobiliary: No solid liver abnormality is seen. Excreted contrast in the gallbladder. No gallstones, gallbladder wall thickening, or biliary dilatation. Pancreas: Unremarkable. No pancreatic ductal dilatation or surrounding inflammatory changes. Spleen: Normal in size without significant abnormality. Adrenals/Urinary Tract: Adrenal glands are unremarkable. Status post right nephrectomy. The left kidney is atrophic. Bladder is unremarkable. Stomach/Bowel: Stomach is within normal limits. Appendix appears normal. No evidence of bowel wall thickening, distention, or inflammatory changes. Colonic diverticulosis. Vascular/Lymphatic: Aortic atherosclerosis. Left femoral venous catheter. No enlarged abdominal or pelvic lymph nodes. Reproductive: No mass or other significant abnormality. Other: Mild anasarca.  No abdominopelvic ascites. Musculoskeletal: No acute or significant osseous findings. Renal osteodystrophy. IMPRESSION: 1. No evidence of retroperitoneal hematoma or other nidus of hemorrhage. 2. There are small bilateral pleural effusions and associated atelectasis or consolidation, with a significant amount of somewhat nodular heterogeneous airspace disease present in the right greater than left lower lobes. Findings are concerning for infection or aspiration 3. Cardiomegaly and coronary artery disease. 4. Status post right nephrectomy. Aortic  Atherosclerosis (ICD10-I70.0). Electronically Signed   By: Eddie Candle M.D.   On: 11/01/2020 10:58   DG Chest 1 View  Result Date: 11/14/2020 CLINICAL DATA:  Congestion, possible aspiration EXAM: CHEST  1 VIEW COMPARISON:  Portable exam 1601 hours compared to 11/08/2020 FINDINGS: Feeding tube extends into the stomach. RIGHT jugular line tip projects over RIGHT atrium unchanged. LEFT subclavian central venous catheter tip projects over LEFT brachiocephalic vein [name] SVC confluence unchanged. Enlargement of cardiac silhouette. Moderate partially loculated RIGHT pleural effusion. Bibasilar consolidation versus atelectasis. Upper lungs clear. No pneumothorax. IMPRESSION: Partially loculated RIGHT pleural effusion. Enlargement of cardiac silhouette. Atelectasis versus consolidation of the lower lobes. Electronically Signed   By: Lavonia Dana M.D.   On: 11/14/2020 19:00   DG Abd 1 View  Result Date: 11/09/2020 CLINICAL DATA:  Ileus. EXAM: ABDOMEN - 1 VIEW COMPARISON:  November 08, 2020. FINDINGS: The bowel gas pattern is normal. Distal tip of nasogastric tube is seen in expected position of distal stomach. Feeding tube is seen looped within the stomach with distal tip in the proximal stomach. Gastric distention noted on prior exam has resolved. Residual contrast and stool is noted throughout the colon. No radio-opaque calculi or other significant radiographic abnormality are seen. IMPRESSION: No evidence of bowel obstruction or ileus. Feeding  tube tip is seen in proximal stomach. Gastric distention noted on prior exam has resolved. Electronically Signed   By: Marijo Conception M.D.   On: 11/09/2020 11:12   DG Abd 1 View  Result Date: 11/07/2020 CLINICAL DATA:  Orogastric tube placement EXAM: ABDOMEN - 1 VIEW COMPARISON:  None. FINDINGS: Orogastric tube tip is seen within the proximal body of the stomach with its proximal side hole seen at the level of gastroesophageal junction. Nasoenteric feeding tube tip noted  within the distal stomach in the region of the pylorus. Normal abdominal gas pattern. No gross free intraperitoneal gas. Right basilar atelectasis or infiltrate. IMPRESSION: Orogastric tube tip just within the gastric lumen. Advancement by 5-10 cm may better position the tube. Nasoenteric feeding tube tip in the region of the pylorus. Advancement of the tube by 10-15 cm should allow eventual passage into the proximal duodenum. Electronically Signed   By: Fidela Salisbury MD   On: 11/07/2020 06:17   DG Abd 1 View  Result Date: 11/03/2020 CLINICAL DATA:  Feeding tube adjustment EXAM: ABDOMEN - 1 VIEW COMPARISON:  11/01/2020 FINDINGS: Feeding tube is in place. Contrast was injected through the feeding tube which demonstrates the tip in the duodenal bulb. IMPRESSION: Feeding tube tip in the proximal duodenum. Electronically Signed   By: Rolm Baptise M.D.   On: 11/03/2020 12:58   DG Abd 1 View  Result Date: 11/01/2020 CLINICAL DATA:  Orogastric tube placement EXAM: ABDOMEN - 1 VIEW COMPARISON:  Multiple prior abdominal studies and chest x-ray of the same date. FINDINGS: Gastric tube tip in the distal esophagus. Similar to the prior exam. Consolidative changes at the RIGHT lung base likely associated with small effusion also similar to the previous study. Vascular catheter with tip at the caval to atrial junction. Patchy opacities at the LEFT lung base. Mild gaseous distension of upper abdominal bowel loops, incomplete assessment. Limited assessment of skeletal structures without acute process. IMPRESSION: 1. Gastric tube tip remains in the distal esophagus. 2. Airspace disease at the lung bases worse on the RIGHT as before. 3. Mild gaseous distension of upper abdominal bowel loops, incomplete assessment. Electronically Signed   By: Zetta Bills M.D.   On: 11/01/2020 15:56   DG Abd 1 View  Result Date: 11/01/2020 CLINICAL DATA:  Check gastric catheter placement EXAM: ABDOMEN - 1 VIEW COMPARISON:  Film from  earlier in the same day. FINDINGS: Gastric catheter is noted in the distal esophagus with the tip just above the gastroesophageal junction. This should be advanced several cm further into the stomach. IMPRESSION: Gastric catheter in the distal esophagus. This should be advanced several cm deeper into the stomach Electronically Signed   By: Inez Catalina M.D.   On: 11/01/2020 12:59   DG Abd 1 View  Result Date: 11/01/2020 CLINICAL DATA:  Orogastric tube positioning EXAM: ABDOMEN - 1 VIEW COMPARISON:  October 31, 2020 FINDINGS: Orogastric tube tip is in the distal esophagus. There is mild dilatation of the stomach with air. No other bowel dilatation. No free air. Airspace opacity in the lung bases, more severe on the right than on the left. IMPRESSION: Orogastric tube tip in distal esophagus. Advise advancing orogastric tube approximately 12 cm. No bowel obstruction or free air.  Moderate air in stomach. Infiltrate in the lung bases, more severe on the right than on the left. These results will be called to the ordering clinician or representative by the Radiologist Assistant, and communication documented in the PACS or [employer].  Electronically Signed   By: Lowella Grip III M.D.   On: 11/01/2020 12:21   DG Abd 1 View  Result Date: 10/31/2020 CLINICAL DATA:  Nasogastric tube placement. EXAM: ABDOMEN - 1 VIEW COMPARISON:  October 31, 2020 (8:47 p.m.) FINDINGS: A nasogastric tube is seen with its distal tip overlying the expected region of the gastroesophageal junction. Stable gastric distension is noted. The bowel gas pattern is otherwise normal. No radio-opaque calculi or other significant radiographic abnormality are seen. IMPRESSION: 1. Stable nasogastric tube positioning, as described above. Electronically Signed   By: Virgina Norfolk M.D.   On: 10/31/2020 21:53   DG Abd 1 View  Result Date: 10/31/2020 CLINICAL DATA:  59 year old male with NG placement. EXAM: ABDOMEN - 1 VIEW  COMPARISON:  None. FINDINGS: Partially visualized enteric tube with side-port in the distal esophagus and tip in the region of the GE junction. Recommend further advancing of the tube by an additional 17 cm. There is gaseous distension of the stomach. IMPRESSION: Enteric tube with side-port in the distal esophagus and tip in the region of the GE junction. Recommend further advancing of the tube by an additional 17 cm. Electronically Signed   By: Anner Crete M.D.   On: 10/31/2020 20:58   CT CHEST WO CONTRAST  Result Date: 10/31/2020 CLINICAL DATA:  Chest pain and increased shortness of breath. EXAM: CT CHEST WITHOUT CONTRAST TECHNIQUE: Multidetector CT imaging of the chest was performed following the standard protocol without IV contrast. COMPARISON:  None. FINDINGS: Cardiovascular: The heart size is mildly increased. No substantial pericardial effusion. Coronary artery calcification is evident. Atherosclerotic calcification is noted in the wall of the thoracic aorta. Mediastinum/Nodes: Scattered upper normal mediastinal lymph nodes evident. No evidence for gross hilar lymphadenopathy although assessment is limited by the lack of intravenous contrast on today's study. The esophagus has normal imaging features. There is no axillary lymphadenopathy. Lungs/Pleura: Extensive multifocal nodular and patchy airspace disease is seen in both lungs. Upper lung disease has a slight peripheral predominance and a 3.1 cm nodular consolidative opacity in the right upper lobe is cavitated (36/4). Confluent airspace disease is seen in the posterior right middle lobe with air bronchograms and relatively diffuse involvement noted right lower lobe as well. No pleural effusion. Upper Abdomen: 17 mm left adrenal nodule cannot be characterized. Liver parenchyma is heterogeneous Musculoskeletal: No worrisome lytic or sclerotic osseous abnormality. Bony mineralization suggests chronic renal disease. IMPRESSION: 1. Extensive  multifocal nodular and patchy airspace disease in both lungs with a slight peripheral predominance in the upper lungs. 3.1 cm nodular consolidative opacity in the right upper lobe is cavitated. Imaging features likely related to multifocal pneumonia. Septic emboli and metastatic disease considered less likely but not excluded. 2. 17 mm left adrenal nodule cannot be characterized. MRI abdomen with and without contrast could be used to further evaluate as clinically warranted. 3. Aortic Atherosclerosis (ICD10-I70.0). Electronically Signed   By: Misty Stanley M.D.   On: 10/31/2020 11:32   MR BRAIN WO CONTRAST  Result Date: 11/06/2020 CLINICAL DATA:  History media, concern for septic emboli. EXAM: MRI HEAD WITHOUT CONTRAST TECHNIQUE: Multiplanar, multiecho pulse sequences of the brain and surrounding structures were obtained without intravenous contrast. COMPARISON:  Brain MRI/MRA 11/13/2012. FINDINGS: Brain: Cerebral volume is normal. Minimal multifocal T2/FLAIR hyperintensity within the cerebral white matter is nonspecific, but compatible with chronic small vessel ischemic disease. There is no acute infarct. No evidence of intracranial mass. No chronic intracranial blood products. No extra-axial fluid collection. No midline  shift. Vascular: Expected proximal arterial flow voids. Skull and upper cervical spine: No focal suspicious marrow lesion. Sinuses/Orbits: Visualized orbits show no acute finding. Mild ethmoid and left sphenoid sinus mucosal thickening. Small volume frothy secretions are also present within the left sphenoid sinus. Other: Bilateral mastoid effusions. Partially visualized support tubes. Secretions within the nasopharynx and oropharynx. IMPRESSION: No non-contrast evidence of acute intracranial abnormality. Mild cerebral white matter chronic small vessel ischemic disease. Ethmoid and left sphenoid sinusitis. Bilateral mastoid effusions. Electronically Signed   By: Kellie Simmering DO   On:  11/06/2020 16:23   MR CERVICAL SPINE WO CONTRAST  Result Date: 11/06/2020 CLINICAL DATA:  Neck pain, acute, infection suspected; bacteremia, not moving lower extremities. EXAM: MRI CERVICAL SPINE WITHOUT CONTRAST TECHNIQUE: Multiplanar, multisequence MR imaging of the cervical spine was performed. No intravenous contrast was administered. COMPARISON:  No pertinent prior exams available for comparison. FINDINGS: Alignment: No significant spondylolisthesis. Vertebrae: Diffuse abnormal hypointense marrow signal throughout the cervical and visualized upper thoracic spine, nonspecific but likely related to the patient's known end-stage renal disease. Trace edema along the C6 inferior endplate is nonspecific, but likely degenerative. No significant marrow edema is identified elsewhere within the cervical spine. Cord: No spinal cord signal abnormality Posterior Fossa, vertebral arteries, paraspinal tissues: Posterior fossa better assessed on the concurrently performed brain MRI. Flow voids preserved within the imaged cervical vertebral arteries. Partially imaged life support tubes. Prominent secretions within the nasopharynx and oropharynx. Paraspinal soft tissues unremarkable. Disc levels: Mild multilevel disc degeneration greatest at C6-C7. C2-C3: Disc osteophyte ridge/uncinate hypertrophy on the right. Minimal partial effacement of the ventral thecal sac on the right without significant central canal stenosis. Mild right neural foraminal narrowing. C3-C4: Mild uncinate and facet hypertrophy on the right. No significant disc herniation or spinal canal stenosis. Mild right neural foraminal narrowing. Trace left facet joint effusion (series 8, image 16). C4-C5: Uncinate hypertrophy (greater on the right). Minimal facet hypertrophy. No significant disc herniation or spinal canal stenosis. Moderate/severe neural foraminal narrowing. Small left facet joint effusion (series 8, image 21). C5-C6: Left center disc protrusion  (series 9, image 26). Uncinate hypertrophy (greater on the right). Minimal facet hypertrophy. The disc protrusion contributes to mild relative spinal canal narrowing. Moderate right neural foraminal narrowing. Trace left facet joint effusion (series 8, image 25) C6-C7: Shallow broad-based left center disc protrusion. Mild uncovertebral and facet hypertrophy. No significant spinal canal stenosis. Bilateral neural foraminal narrowing (moderate right, mild left). Small left facet joint effusion (series 8, image 30). C7-T1: No significant disc herniation or stenosis. Trace left facet joint effusion. IMPRESSION: Trace or small facet joint effusions on the left at C3-C4, C4-C5, C5-C6, C6-C7 and C7-T1 are nonspecific and may be degenerative in etiology. Given the provided history of bacteremia, facet joint septic arthritis is difficult to definitively exclude, but the lack of any surrounding marrow edema argues against this. Minimal edema along the C6 inferior endplate is likely degenerative. Cervical spondylosis as outline. At C5-C6, a left center disc protrusion contributes to mild relative spinal canal narrowing. No significant canal stenosis at the remaining levels. Multilevel neural foraminal narrowing greatest on the right at C4-C5 (moderate/severe), on the right at C5-C6 (moderate) and on the right at C6-C7 (moderate). Diffuse abnormal T1 hypointense marrow signal, nonspecific but likely related to the patient's end-stage renal disease. Electronically Signed   By: Kellie Simmering DO   On: 11/06/2020 16:39   MR THORACIC SPINE WO CONTRAST  Result Date: 11/06/2020 CLINICAL DATA:  Mid back pain;  bacteremia, not moving lower extremity. EXAM: MRI THORACIC SPINE WITHOUT CONTRAST TECHNIQUE: Multiplanar, multisequence MR imaging of the thoracic spine was performed. No intravenous contrast was administered. COMPARISON:  CT chest 10/31/2020. FINDINGS: Mildly motion degraded examination. Alignment: Thoracic levocurvature. No  significant spondylolisthesis. Vertebrae: Vertebral body height is maintained. Multilevel degenerative endplate irregularity and small Schmorl nodes. Mild edema along the T6 inferior endplate, at site of a Schmorl node, likely degenerative. No significant marrow edema identified elsewhere within the thoracic spine. Diffuse hypointense marrow signal throughout the thoracic spine, nonspecific but likely related to the patient's end-stage renal disease. Cord: Within the limitations of motion degradation, there is no convincing spinal cord signal abnormality. Paraspinal and other soft tissues: Paraspinal soft tissues within normal limits. Incompletely assessed multifocal airspace disease and cavitary pulmonary lesions. Small right pleural effusion. 13 mm T2 hyperintense lesion within the hepatic dome, incompletely assessed on this noncontrast examination. Disc levels: No more than mild disc degeneration at any level. No focal disc herniation, spinal canal stenosis or neural foraminal narrowing at any level. IMPRESSION: Mild edema along the T6 inferior endplate, at site of a Schmorl node, likely degenerative. No significant marrow edema elsewhere within the thoracic spine. Mild thoracic spondylosis. No focal disc herniation, spinal canal stenosis or neural foraminal narrowing at any level. Diffuse hypointense marrow signal throughout the thoracic spine, likely related to the patient's end-stage renal disease. Incompletely assessed multifocal airspace disease and cavitary pulmonary lesions. Small right pleural effusion. 13 mm T2 hyperintense lesion within the hepatic dome, incompletely assessed on this non-contrast examination. Electronically Signed   By: Kellie Simmering DO   On: 11/06/2020 17:05   MR LUMBAR SPINE WO CONTRAST  Result Date: 11/06/2020 CLINICAL DATA:  Back pain, infection suspected; bacteremia, not moving legs. EXAM: MRI LUMBAR SPINE WITHOUT CONTRAST TECHNIQUE: Multiplanar, multisequence MR imaging of the  lumbar spine was performed. No intravenous contrast was administered. COMPARISON:  CT abdomen/pelvis 11/01/2020. FINDINGS: Segmentation:  5 lumbar vertebrae. Alignment:  No significant spondylolisthesis. Vertebrae: Vertebral body height is maintained. Multilevel degenerative endplate irregularity with small Schmorl nodes. There is trace multilevel endplate edema, largely adjacent to Schmorl nodes, and findings are likely degenerative in etiology. No suspicious vertebral body marrow edema to suggest discitis/osteomyelitis on the current exam. Mild marrow edema along the L4-L5 and L5-S1 facet joints. Diffuse hypointense marrow signal throughout the lumbar spine nonspecific but likely related to the patient's end-stage renal disease. Conus medullaris and cauda equina: Conus extends to the L1 level. Conus and cauda equina appear normal. Paraspinal and other soft tissues: Layering hypointense signal within the gallbladder likely reflecting cholecystolithiasis. Nonspecific edema signal within the lumbar paraspinal soft tissues. Disc levels: No more than mild disc degeneration at any level. No significant disc herniation, spinal canal stenosis or neural foraminal narrowing at the T12-L1 through L3-L4 levels. L4-L5: Small disc bulge. Facet arthrosis with ligamentum flavum hypertrophy. Trace bilateral facet joint effusions. No significant spinal canal stenosis or neural foraminal narrowing. L5-S1: Disc bulge. Superimposed broad-based central disc protrusion eccentric to the right at site of posterior annular fissure. Facet arthrosis with mild ligamentum flavum hypertrophy. Mild right subarticular narrowing with the disc protrusion likely contacting the descending right S1 nerve root. Central canal patent. Mild relative left neural foraminal narrowing. IMPRESSION: Trace bilateral L4-L5 facet joint effusions with mild surrounding marrow edema. Findings are nonspecific and could certainly be degenerative given the facet  arthrosis present at this level. Facet joint septic arthrosis is difficult to definitively exclude given the provided history of bacteremia.  Mild marrow edema along the bilateral L5-S1 facet joints is nonspecific, but favored degenerative given the significant facet arthrosis at this level and lack of facet joint effusions. Trace multilevel endplate edema appears degenerative. Lumbar spondylosis as outlined. No more than mild spinal canal or neural foraminal narrowing at any level. Most notably, and L5-S1 disc protrusion contributes to multifactorial mild right subarticular narrowing and contacts the descending right S1 nerve root. Diffuse hypointense marrow signal likely related to the patient's end-stage renal disease. Nonspecific edema within the bilateral lumbar paraspinal soft tissues. Suspected cholecystolithiasis. Electronically Signed   By: Kellie Simmering DO   On: 11/06/2020 16:53   CT ABDOMEN PELVIS W CONTRAST  Result Date: 11/07/2020 CLINICAL DATA:  Nausea and vomiting EXAM: CT ABDOMEN AND PELVIS WITH CONTRAST TECHNIQUE: Multidetector CT imaging of the abdomen and pelvis was performed using the standard protocol following bolus administration of intravenous contrast. CONTRAST:  15mL OMNIPAQUE IOHEXOL 300 MG/ML  SOLN COMPARISON:  Multiple exams, including 11/01/2020 and abdominal radiograph from 11/07/2020 FINDINGS: Lower chest: Coronary atherosclerosis and mild cardiomegaly. Small right pleural effusion. Continued bibasilar airspace opacities with air bronchograms especially posteriorly in the lower lobes, some of the right middle lobe airspace opacity has resolved. Hepatobiliary: Dependent density in the gallbladder possibly probably from prior contrast excretion. On prior MRI thoracic spine there was a T2 hyperintense lesion in the dome of the right hepatic lobe posteriorly, this is not readily apparent on today's CT scan although this partly may be from streak artifact related to the patient's arm  positioning. Pancreas: Mild dorsal pancreatic duct dilatation is observed in the pancreatic body and head, up to 0.4 cm diameter. No specific cause is identified. Spleen: Unremarkable Adrenals/Urinary Tract: Adrenal glands unremarkable. Right nephrectomy. Atrophic left kidney with multiple cysts, including a rim calcified lesion which is unchanged. Empty urinary bladder. Stomach/Bowel: Nasogastric tube terminates the stomach body and a feeding tube terminates in the stomach antrum. Scattered diverticula of the colon most concentrated in the ascending and sigmoid colon. No dilated bowel. Normal appendix. Vascular/Lymphatic: Aortoiliac atherosclerotic vascular disease. Mild stranding around the right common femoral artery likely related to recent catheterization common no substantial or masslike hematoma identified. Reproductive: Borderline prostatomegaly. Other: No supplemental non-categorized findings. Musculoskeletal: Degenerative disc disease and degenerative facet arthropathy in the lower lumbar spine. IMPRESSION: 1. A specific cause for the patient's nausea and vomiting is not identified. 2. Continued bibasilar airspace opacities with air bronchograms especially posteriorly in the lower lobes, some of the right middle lobe airspace opacity has resolved. Small right pleural effusion. 3. Mild dorsal pancreatic duct dilatation in the pancreatic body and head, up to 0.4 cm diameter. No specific cause is identified. 4. Atrophic left kidney with multiple cysts, including a rim calcified lesion which is unchanged. 5. The T2 hyperintense lesion in segment 7 of the liver on prior thoracic spine MRI is not well seen on today's CT scan. The region is partially obscured by streak artifact from the patient's arms. 6. Other imaging findings of potential clinical significance: Coronary atherosclerosis and mild cardiomegaly. Borderline prostatomegaly. Degenerative disc disease and degenerative facet arthropathy in the lower  lumbar spine. 7. Aortic atherosclerosis. Aortic Atherosclerosis (ICD10-I70.0). Electronically Signed   By: Van Clines M.D.   On: 11/07/2020 11:53   CARDIAC CATHETERIZATION  Result Date: 10/28/2020  There is mild to moderate left ventricular systolic dysfunction. The left ventricular ejection fraction is 40-45% by visual estimate - > anterior-anterolateral apical hypokinesis  LV end diastolic pressure is severely elevated ->  on recheck post PCI, EDP 30 mmHg  Mid LAD lesion is 99% stenosed. 1st Diag lesion is 55% stenosed just proximal to the lesion  A drug-eluting stent was successfully placed crossing the diagonal branch, using a Ione [id number]. Postdilated to 3.1 mm  Post intervention, there is a 0% residual stenosis.  LPAV lesion is 99% stenosed -> ostial lesion (LCx is 90  turn followed by another 90  turn into the AVG)  Prox RCA lesion is 80% stenosed -> concentric napkin ring calcified lesion.  SUMMARY  Acute anterolateral ST elevation MI with Culprit lesion being the 99% mid LAD lesion (just past major 1st Diag/D1) along with multivessel CAD including 99% ostial AV groove LCx and 80% calcified napkin ring proximal RCA lesion with extensive calcification throughout the RCA.  Successful PTCA and DES PCI of the LAD crossing D1 -resolute Onyx DES 2.75 mm x 18 mm postdilated to 3.1 mm.  With PCI, there was stabilization of significant ectopy, AIVR and PVCs  Mild to moderate reduced EF with anterior anterolateral hypokinesis, EF roughly 40 to 45%.  Initial evaluation suggested normal EDP, but on recheck post PCI LVEDP of 30 mmHg, severely elevated consistent with ACUTE DIASTOLIC HEART FAILURE  Significantly dilated aortic root requiring AL-1 guide catheter for both Left and Right Coronary Angiography. RECOMMENDATIONS  Admit to CCU, restart IV heparin 8 hours after sheath removal  Check 2D echo for better assessment of EF  Plan to review cath films with other IC  cardiologist, anticipate staged PCI of at least the RCA which may require atherectomy versus lithotripsy, and likely the ostial AV groove circumflex.  If he has signs of dyspnea, consider earlier dialysis than tomorrow. ->  Would consult for nephrology to see today.  He is already on labetalol and Norvasc.  We will continue current home medications.  Uninterrupted DAPT times X 1 year. Glenetta Hew, M.D., M.S. Interventional Cardiologist Pager # [phone] Phone # [phone] [address] [place], [place] [address]  DG CHEST PORT 1 VIEW  Result Date: 11/08/2020 CLINICAL DATA:  Hypoxia EXAM: PORTABLE CHEST 1 VIEW COMPARISON:  November 06, 2020 FINDINGS: Endotracheal tube tip is 5.1 cm above the carina. Nasogastric tube tip and side port are below the diaphragm. Left subclavian catheter tip is in the left innominate vein [name] the junction with the superior vena cava. Right subclavian catheter tip is in the right atrium slightly beyond the cavoatrial junction. No pneumothorax. There is a small right pleural effusion. There is ill-defined airspace opacity in each lower lung region, primarily due to atelectasis with potential superimposed bibasilar pneumonia. There is cardiomegaly with pulmonary venous hypertension. No adenopathy. Evidence of old healed right clavicle fracture. There is aortic atherosclerosis. IMPRESSION: Tube and catheter positions as described without pneumothorax. Small right pleural effusion. Ill-defined opacity in the lung bases, slightly increased, likely due to combination of atelectasis and potential bibasilar pneumonia. Stable cardiomegaly with a degree of pulmonary vascular congestion. Aortic Atherosclerosis (ICD10-I70.0). Electronically Signed   By: Lowella Grip III M.D.   On: 11/08/2020 08:15   DG CHEST PORT 1 VIEW  Result Date: 11/06/2020 CLINICAL DATA:  Status post endotracheal intubation EXAM: PORTABLE CHEST 1 VIEW COMPARISON:  Film from earlier in the same  day. FINDINGS: Endotracheal tube is again identified and stable. Right jugular temporary dialysis catheter is noted. Feeding catheter is seen extending into the stomach. New left subclavian central line is noted with the catheter tip at the confluence of the innominate veins. No  pneumothorax is seen. Patchy persistent atelectasis is noted in the right lung base with associated effusion. IMPRESSION: Tubes and lines as described above. No pneumothorax following central line placement. Stable airspace opacity and effusion in the right base. Electronically Signed   By: Inez Catalina M.D.   On: 11/06/2020 21:03   DG Chest Port 1 View  Result Date: 11/06/2020 CLINICAL DATA:  Dialysis. EXAM: PORTABLE CHEST 1 VIEW COMPARISON:  November 04, 2020. FINDINGS: Stable cardiomediastinal silhouette. Endotracheal and feeding tubes are unchanged in position. Right internal jugular catheter is unchanged. No pneumothorax is noted. Stable bibasilar atelectasis or infiltrates are noted, with probable small right pleural effusion. Bony thorax is unremarkable. IMPRESSION: Stable support apparatus. Stable bibasilar atelectasis or infiltrates are noted, with probable small right pleural effusion. Electronically Signed   By: Marijo Conception M.D.   On: 11/06/2020 10:42   DG CHEST PORT 1 VIEW  Result Date: 11/04/2020 CLINICAL DATA:  Code stemi. Encounter for routine chest x-ray per order. EXAM: PORTABLE CHEST 1 VIEW COMPARISON:  11/03/2020 FINDINGS: An enteric tube is been placed. The tip is off the field of view but below the left hemidiaphragm. Endotracheal tube and right central venous catheter are unchanged in position. Cardiac enlargement. Shallow inspiration with atelectasis or infiltration in the lung bases. Probable small right pleural effusion. No significant change. No pneumothorax. Mediastinal contours appear intact. IMPRESSION: Appliances appear in satisfactory position. Cardiac enlargement. Shallow inspiration with  atelectasis or infiltration in the lung bases and small right pleural effusion. Electronically Signed   By: Lucienne Capers M.D.   On: 11/04/2020 06:23   DG CHEST PORT 1 VIEW  Result Date: 11/03/2020 CLINICAL DATA:  Respiratory failure.  History of hypertension. EXAM: PORTABLE CHEST 1 VIEW COMPARISON:  11/01/2020 FINDINGS: Endotracheal tube and right central venous catheter are unchanged in position. Cardiac enlargement. Bilateral basilar atelectasis or infiltration with some improvement in the right base since prior study. Probable small right pleural effusion. IMPRESSION: Bilateral basilar atelectasis or infiltration with some improvement in the right base since prior study. Electronically Signed   By: Lucienne Capers M.D.   On: 11/03/2020 06:05   DG Chest Port 1 View  Result Date: 11/01/2020 CLINICAL DATA:  59 year old male enteric tube placement. EXAM: PORTABLE CHEST 1 VIEW COMPARISON:  Portable chest 1209 hours today and earlier. FINDINGS: Portable AP semi upright view at 1536 hours. Enteric tube courses through the mediastinum into the left abdomen, tip not included. Right IJ approach dual lumen catheter and endotracheal tube appear stable. Stable cardiac size and mediastinal contours. Stable ventilation. No pneumothorax. Confluent right lung base opacity. IMPRESSION: 1. Enteric tube now extends into the left abdomen, tip not included. 2. Otherwise stable lines and tubes. 3. Stable ventilation with right lung base consolidation. Electronically Signed   By: Genevie Ann M.D.   On: 11/01/2020 15:57   DG CHEST PORT 1 VIEW  Result Date: 11/01/2020 CLINICAL DATA:  Central catheter placement EXAM: PORTABLE CHEST 1 VIEW COMPARISON:  November 01, 2020 study obtained earlier in the day FINDINGS: Central catheter tip is in the superior vena cava. Endotracheal tube tip is 3.4 cm above the carina. Nasogastric tube tip is not seen below the level of the distal esophagus. No pneumothorax. There is patchy  airspace opacity bilaterally, somewhat more severe on the right than on the left, stable. No new opacity evident. Heart upper normal in size with pulmonary vascularity normal. No adenopathy. No bone lesions. IMPRESSION: Tube and catheter positions as described without pneumothorax.  Note that nasogastric tube tip is not seen beyond distal esophagus. Multifocal airspace opacity consistent with pneumonia, more severe on the right than on the left. No new opacity appreciable compared to earlier in the day. Stable cardiac prominence. Electronically Signed   By: Lowella Grip III M.D.   On: 11/01/2020 12:20   DG Chest Port 1 View  Result Date: 11/01/2020 CLINICAL DATA:  Cardiac arrest. EXAM: PORTABLE CHEST 1 VIEW COMPARISON:  One-view chest x-ray 10/31/2020 FINDINGS: Endotracheal tube is stable and in satisfactory position. Enteric tube terminates in the distal esophagus. Proximal aspect of the tube is coiled in the upper esophagus, above the thoracic inlet. Defibrillator pads are in place. Heart is enlarged. Interstitial airspace opacities are again noted bilaterally, right greater than left. Aeration is improved. IMPRESSION: 1. Stable and satisfactory positioning of the endotracheal tube. 2. Stable cardiomegaly. 3. Improved aeration bilaterally. 4. Enteric tube terminates in the distal esophagus. Proximal aspect of tube is coiled in the cervical esophagus Electronically Signed   By: San Morelle M.D.   On: 11/01/2020 04:05   DG CHEST PORT 1 VIEW  Result Date: 10/31/2020 CLINICAL DATA:  Orogastric tube placement EXAM: PORTABLE CHEST 1 VIEW COMPARISON:  Earlier today FINDINGS: Endotracheal tube with tip between the clavicular heads and carina. The orogastric tube loops through the esophagus both proximally and distally. The tip does not reach the stomach, which is gas distended since prior. Extensive infiltrate on the right more than left with possible volume loss at the right base where there is  progressively dense opacity. Generous heart size. No visible pneumothorax. These results will be called to the ordering clinician or representative by the Radiologist Assistant, and communication documented in the PACS or [employer]. IMPRESSION: 1. Malpositioned orogastric tube which loops in the esophagus. The stomach is gas dilated. 2. Endotracheal tube in unremarkable position. 3. Multifocal airspace disease with progressively dense opacity at the right base. Electronically Signed   By: Monte Fantasia M.D.   On: 10/31/2020 20:18   DG CHEST PORT 1 VIEW  Result Date: 10/31/2020 CLINICAL DATA:  Right-sided chest pain and shortness of breath EXAM: PORTABLE CHEST 1 VIEW COMPARISON:  10/28/2020 FINDINGS: Cardiac shadow is enlarged but accentuated by the portable technique. New patchy infiltrate in the right mid and lower lung is noted consistent with acute pneumonia. This was not seen on the prior exam. No acute rib abnormality is noted. IMPRESSION: New patchy right mid and lower lung infiltrate. Electronically Signed   By: Inez Catalina M.D.   On: 10/31/2020 09:41   DG CHEST PORT 1 VIEW  Result Date: 10/28/2020 CLINICAL DATA:  Acute MI.  Shortness of breath. EXAM: PORTABLE CHEST 1 VIEW COMPARISON:  January 30, 2015 FINDINGS: The cardiomediastinal silhouette is stable. Mild increased interstitial opacities seen in the perihilar regions, right greater than left. No pneumothorax. No other acute abnormalities. IMPRESSION: Mild perihilar opacities are suggestive of mild edema given the history provided of an acute myocardial infarction. Recommend attention on follow-up. Electronically Signed   By: Dorise Bullion III M.D   On: 10/28/2020 13:40   DG Abd Portable 1V  Result Date: 11/08/2020 CLINICAL DATA:  Feeding tube placement and orogastric tube placement EXAM: PORTABLE ABDOMEN - 1 VIEW COMPARISON:  Portable exam 1217 hours compared to 11/07/2020 FINDINGS: Central venous catheter tip projects over  RIGHT atrium. Feeding tube coiled in stomach. Nasogastric tube tip in stomach. Gaseous distention of stomach new since prior exam. Scattered residual contrast throughout colon. Rounded calcification projects  over LEFT kidney corresponding to a calcified cyst on a prior CT. Osseous structures unremarkable. IMPRESSION: Nasogastric tube and coiled feeding tube both located within a stomach distended by gas. Electronically Signed   By: Lavonia Dana M.D.   On: 11/08/2020 12:34   DG Swallowing Func-Speech Pathology  Result Date: 11/14/2020 Objective Swallowing Evaluation: Type of Study: MBS-Modified Barium Swallow Study  Patient Details Name: COREON SIMKINS MRN: [id number] Date of Birth: 1961/03/03 Today's Date: 11/14/2020 Time: SLP Start Time (ACUTE ONLY): 1400 -SLP Stop Time (ACUTE ONLY): 1425 SLP Time Calculation (min) (ACUTE ONLY): 25 min Past Medical History: Past Medical History: Diagnosis Date . Acute ST elevation myocardial infarction (STEMI) of anterolateral wall ([facility]) 10/28/2020  Culprit lesion, 99% ulcerated mid LAD at D1 -> DES PCI . Anemia of chronic disease 01/30/2015 . Coronary artery disease involving native coronary artery of native heart with unstable angina pectoris ([facility]) 10/28/2020  Acute anterolateral STEMI: 3V CAD: Culprit Lesion = 99% mid LAD lesion (just after D1) - DES PCI; 99% ostial AV groove LCx; & 80% calcified napkin ring proximal RCA lesion with extensive calcification throughout the RCA. Successful PTCA and DES PCI of the LAD crossing D1 -resolute Onyx DES 2.75 mm x 18 mm postdilated to 3.1 mm. With PCI, there . End-stage renal disease on hemodialysis ([place])  . GERD (gastroesophageal reflux disease)   takes Nexium . Hyperlipidemia with target LDL less than 70 10/28/2020 . Hypertension  . Pneumonia   as a child . 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 Sleep apnea   uses c-pap 2 yrs Past  Surgical History: Past Surgical History: Procedure Laterality Date . AV FISTULA PLACEMENT Left 12/22/2017  Procedure: REPAIR PSEUDOANEURYSM ARTERIOVENOUS (AV) FISTULA;  Surgeon: [name], MD;  Location: [place];  Service: Vascular;  Laterality: Left; . BASCILIC VEIN TRANSPOSITION Left 01/05/2014  Procedure: LEFT 1ST STAGE BASCILIC VEIN TRANSPOSITION;  Surgeon: Conrad Oilton, MD;  Location: [place];  Service: Vascular;  Laterality: Left; . BASCILIC VEIN TRANSPOSITION Left 07/26/2014  Procedure: Left Arm Brachial Vein Transposition Second Stage;  Surgeon: Conrad , MD;  Location: [place];  Service: Vascular;  Laterality: Left; . COLONOSCOPY   . COLONOSCOPY N/A 02/02/2015  Procedure: COLONOSCOPY;  Surgeon: [name], MD;  Location: [facility] ENDOSCOPY;  Service: Endoscopy;  Laterality: N/A; . CORONARY STENT INTERVENTION N/A 10/28/2020  Procedure: CORONARY STENT INTERVENTION;  Surgeon: Leonie Man, MD;  Location: [place] [facility];  Service: Cardiovascular;  Laterality: N/A; . CORONARY/GRAFT ACUTE MI REVASCULARIZATION N/A 10/28/2020  Procedure: Coronary/Graft Acute MI Revascularization;  Surgeon: Leonie Man, MD;  Location: [place] [facility];  Service: Cardiovascular;  Laterality: N/A; . ESOPHAGOGASTRODUODENOSCOPY Left 01/31/2015  Procedure: ESOPHAGOGASTRODUODENOSCOPY (EGD);  Surgeon: [name], MD;  Location: [facility] ENDOSCOPY;  Service: Endoscopy;  Laterality: Left; . ESOPHAGOGASTRODUODENOSCOPY N/A 11/09/2020  Procedure: ESOPHAGOGASTRODUODENOSCOPY (EGD);  Surgeon: Clarene Essex, MD;  Location: [place];  Service: Endoscopy;  Laterality: N/A; . EVALUATION UNDER ANESTHESIA WITH HEMORRHOIDECTOMY N/A 02/26/2018  Procedure: ANORECTAL EXAM UNDER ANESTHESIA  HEMORRHOIDECTOMY x 2  HEMORRHOIDAL LIGATION AND PEXY;  Surgeon: Avyay Boston, MD;  Location: [facility];  Service: General;  Laterality: N/A; . GIVENS CAPSULE STUDY N/A 01/31/2015  Procedure: GIVENS CAPSULE STUDY;  Surgeon: [name], MD;  Location: [facility] ENDOSCOPY;   Service: Endoscopy;  Laterality: N/A; . LEFT HEART CATH AND CORONARY ANGIOGRAPHY N/A 10/28/2020  Procedure: LEFT  HEART CATH AND CORONARY ANGIOGRAPHY;  Surgeon: Leonie Man, MD;  Location: [place] CV LAB;  Service: Cardiovascular;  Laterality: N/A; . MOUTH SURGERY    teeth cleaning . 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 He describes his chest tightness, 7/10, with radiation to his back (between his scapula), he has not exerted. EKG in ED is suggestive of anterolateral ST elevations. In the ED he was given thorazine for hiccups; CXR 10/31/20 indicated New patchy right mid and lower lung infiltrate; CT chest 10/31/20 yielded results including: Extensive multifocal nodular and patchy airspace disease in both lungs with a slight peripheral predominance in the upper lungs. 3.1 cm nodular consolidative opacity in the right upper lobe is cavitated. Imaging features likely related to multifocal pneumonia. Pt intubated 11/23-12/3/21; gastroenterology consult 11/09/20 with UGI completed and results as follows: Moderately severe reflux and erosive esophagitis  with no bleeding; Hematin (altered blood/coffee-ground-like material) in the gastric antrum, in the cardia, in  the gastric fundus and in the gastric body. Fluid aspiration performed. Probable ischemic duodenitis. Normal third portion of the duodenum. The examination was otherwise normal. BSE generated.  Subjective: patient awake and alert but did not attempt to verbalize, was having difficulty sitting up and was unable to keep head up during study Assessment / Plan / Recommendation CHL IP CLINICAL IMPRESSIONS 11/14/2020 Clinical Impression Patient presents with a moderate oropharyngeal dysphagia with likely impact from current cognitive  status and pain leading to difficulty maintaining adequate posture and participation during MBS. Patient kept head in a downward, almost chin tuck posture and was not able to raise head despite tactile and physical cues. Oral delay in transit led to premature spillage into vallecular sinus, and swallow initiation occuring at level of vallecular sinus. With puree bolus, patient exhibited more significant delay in swallow initiation and had mild-mod vallecular residuals post initial swallow. No penetration or aspiration observed and no significant amount of pyriform sinus residuals was observed. UES opening appeared [facility] and without evidence of retrograde movement of boluses. Currently, patient is not safe for full PO's, but this is largely due to his inability to maintain adequate participation combined with his overall weakness. Recommendation is remain NPO but allow for SLP to assess patient toleration of trials of thin liquids, nectar thick liquids, puree solids at bedside. SLP Visit Diagnosis Dysphagia, oropharyngeal phase (R13.12) Attention and concentration deficit following -- Frontal lobe and executive function deficit following -- Impact on safety and function Risk for inadequate nutrition/hydration;Mild aspiration risk;Moderate aspiration risk   CHL IP TREATMENT RECOMMENDATION 11/14/2020 Treatment Recommendations Therapy as outlined in treatment plan below   Prognosis 11/14/2020 Prognosis for Safe Diet Advancement Good Barriers to Reach Goals -- Barriers/Prognosis Comment -- CHL IP DIET RECOMMENDATION 11/14/2020 SLP Diet Recommendations NPO;Other (Comment) Liquid Administration via -- Medication Administration Via alternative means Compensations -- Postural Changes --   CHL IP OTHER RECOMMENDATIONS 11/14/2020 Recommended Consults -- Oral Care Recommendations Oral care QID Other Recommendations --   CHL IP FOLLOW UP RECOMMENDATIONS 11/14/2020 Follow up Recommendations 24 hour supervision/assistance;Skilled Nursing  facility;Inpatient Rehab   CHL IP FREQUENCY AND DURATION 11/14/2020 Speech Therapy Frequency (ACUTE ONLY) min 2x/week Treatment Duration 1 week      CHL IP ORAL PHASE 11/14/2020 Oral Phase -- Oral - Pudding Teaspoon --  Oral - Pudding Cup -- Oral - Honey Teaspoon -- Oral - Honey Cup -- Oral - Nectar Teaspoon Premature spillage;Left anterior bolus loss;Right anterior bolus loss;Weak lingual manipulation;Delayed oral transit Oral - Nectar Cup Delayed oral transit;Weak lingual manipulation;Right anterior bolus loss;Left anterior bolus loss;Premature spillage Oral - Nectar Straw -- Oral - Thin Teaspoon Premature spillage;Weak lingual manipulation;Left anterior bolus loss;Right anterior bolus loss;Delayed oral transit Oral - Thin Cup -- Oral - Thin Straw -- Oral - Puree Weak lingual manipulation;Delayed oral transit;Premature spillage Oral - Mech Soft -- Oral - Regular -- Oral - Multi-Consistency -- Oral - Pill -- Oral Phase - Comment --  CHL IP PHARYNGEAL PHASE 11/14/2020 Pharyngeal Phase -- Pharyngeal- Pudding Teaspoon -- Pharyngeal -- Pharyngeal- Pudding Cup -- Pharyngeal -- Pharyngeal- Honey Teaspoon -- Pharyngeal -- Pharyngeal- Honey Cup -- Pharyngeal -- Pharyngeal- Nectar Teaspoon Delayed swallow initiation-vallecula;Pharyngeal residue - valleculae Pharyngeal -- Pharyngeal- Nectar Cup Delayed swallow initiation-vallecula;Pharyngeal residue - valleculae Pharyngeal -- Pharyngeal- Nectar Straw -- Pharyngeal -- Pharyngeal- Thin Teaspoon Delayed swallow initiation-vallecula;Pharyngeal residue - valleculae Pharyngeal -- Pharyngeal- Thin Cup Pharyngeal residue - valleculae;Delayed swallow initiation-vallecula;Reduced tongue base retraction Pharyngeal -- Pharyngeal- Thin Straw -- Pharyngeal -- Pharyngeal- Puree Pharyngeal residue - valleculae;Delayed swallow initiation-vallecula;Reduced tongue base retraction Pharyngeal -- Pharyngeal- Mechanical Soft -- Pharyngeal -- Pharyngeal- Regular -- Pharyngeal -- Pharyngeal-  Multi-consistency -- Pharyngeal -- Pharyngeal- Pill -- Pharyngeal -- Pharyngeal Comment --  CHL IP CERVICAL ESOPHAGEAL PHASE 11/14/2020 Cervical Esophageal Phase WFL Pudding Teaspoon -- Pudding Cup -- Honey Teaspoon -- Honey Cup -- Nectar Teaspoon -- Nectar Cup -- Nectar Straw -- Thin Teaspoon -- Thin Cup -- Thin Straw -- Puree -- Mechanical Soft -- Regular -- Multi-consistency -- Pill -- Cervical Esophageal Comment -- Sonia Baller, MA, CCC-SLP Speech Therapy [facility] Acute [facility]             VAS US DUPLEX DIALYSIS ACCESS (AVF, AVG)  Result Date: 10/31/2020 DIALYSIS ACCESS Reason for Exam: Bacteremia. Access Site: Left Upper Extremity. Access Type: Basilic vein transposition. History: Fever, rule out abscess. Left AVF pseudoaneurysm repair 12/22/2017. Limitations: bandages, positioning, patient cooperation, patient receiving              bedside hemodialysis during examination. Comparison Study: No prior study Performing Technologist: Sharion Dove RVS Supporting Technologist: Maudry Mayhew MHA, RDMS, RVT, RDCS  Examination Guidelines: A complete evaluation includes B-mode imaging, spectral Doppler, color Doppler, and power Doppler as needed of all accessible portions of each vessel. Unilateral testing is considered an integral part of a complete examination. Limited examinations for reoccurring indications may be performed as noted.  Findings: +--------------------+----------+-----------------+--------+ AVF                 PSV (cm/s)Flow Vol (mL/min)Comments +--------------------+----------+-----------------+--------+ Native artery inflow   125          1816                +--------------------+----------+-----------------+--------+ AVF Anastomosis        132                              +--------------------+----------+-----------------+--------+  +------------+----------+-------------+----------+----------+ OUTFLOW VEINPSV (cm/s)Diameter (cm)Depth (cm) Describe   +------------+----------+-------------+----------+----------+ Prox UA        218        0.74                          +------------+----------+-------------+----------+----------+ Dist UA  2.40               aneurysmal +------------+----------+-------------+----------+----------+ AC Fossa       372        0.78                          +------------+----------+-------------+----------+----------+   Summary: Arteriovenous fistula-Aneurysmal dilatation noted.  *See table(s) above for measurements and observations.  Diagnosing physician: Deitra Mayo MD Electronically signed by Deitra Mayo MD on 10/31/2020 at 5:59:08 PM.    --------------------------------------------------------------------------------   Final    ECHOCARDIOGRAM COMPLETE  Result Date: 10/31/2020    ECHOCARDIOGRAM REPORT   Patient Name:   Travis [name] Date of Exam: 10/31/2020 Medical Rec #:  [id number]         Height:       71.0 in Accession #:    [id number]        Weight:       182.5 lb Date of Birth:  Dec 12, 1960        BSA:          2.028 m Patient Age:    81 years          BP:           112/74 mmHg Patient Gender: M                 HR:           98 bpm. Exam Location:  Inpatient Procedure: 2D Echo, 3D Echo, Strain Analysis, Color Doppler and Cardiac Doppler Indications:    Acute myocardial infarction 410  History:        Patient has prior history of Echocardiogram examinations, most                 recent 11/13/2012. CHF, CAD and Acute MI, Arrythmias:Atrial                 Fibrillation; Risk Factors:Hypertension and Dyslipidemia.                 Chronic kidney disease. Ascending AoTAA. Septic shock due to                 Staphylococcus aureus.  Sonographer:    [name] RDCS Referring Phys: [id number] [facility]  Sonographer Comments: Global longitudinal strain was attempted. Cardioversion performed before echocardiogram IMPRESSIONS  1. Left ventricular ejection fraction, by estimation, is  35 to 40%. The left ventricle has moderately decreased function. The left ventricle demonstrates regional wall motion abnormalities (see scoring diagram/findings for description). There is moderate concentric left ventricular hypertrophy. Left ventricular diastolic parameters are consistent with Grade III diastolic dysfunction (restrictive). Elevated left atrial pressure. There is severe hypokinesis of the left ventricular, mid-apical inferolateral wall and inferior wall. There is moderate hypokinesis of the left ventricular, apical septal wall, anterior wall and apical segment.  2. Right ventricular systolic function is normal. The right ventricular size is normal. There is mildly elevated pulmonary artery systolic pressure. The estimated right ventricular systolic pressure is 92.4 mmHg.  3. Left atrial size was severely dilated.  4. Right atrial size was moderately dilated.  5. By PISA, effective regurgitant orifice area is 0.4 cm, the regurgitant volume is 55 ml, regurgitant fraction 40%. The mitral valve is normal in structure. Moderate to severe mitral valve regurgitation.  6. Tricuspid valve regurgitation is mild to moderate.  7. The aortic valve is tricuspid. There is mild calcification of the aortic valve.  There is moderate thickening of the aortic valve. Aortic valve regurgitation is mild. Mild to moderate aortic valve sclerosis/calcification is present, without any evidence of aortic stenosis.  8. Aortic dilatation noted. There is mild dilatation of the ascending aorta, measuring 41 mm.  9. The inferior vena cava is dilated in size with <50% respiratory variability, suggesting right atrial pressure of 15 mmHg. Conclusion(s)/Recommendation(s): No evidence of valvular vegetations on this transthoracic echocardiogram. Would recommend a transesophageal echocardiogram to exclude infective endocarditis if clinically indicated. FINDINGS  Left Ventricle: Left ventricular ejection fraction, by estimation, is 35 to  40%. The left ventricle has moderately decreased function. The left ventricle demonstrates regional wall motion abnormalities. Severe hypokinesis of the left ventricular, mid-apical inferolateral wall and inferior wall. Moderate hypokinesis of the left ventricular, apical septal wall, anterior wall and apical segment. The left ventricular internal cavity size was normal in size. There is moderate concentric left ventricular hypertrophy. Left ventricular diastolic parameters are consistent with Grade III diastolic dysfunction (restrictive). Elevated left atrial pressure. Right Ventricle: The right ventricular size is normal. No increase in right ventricular wall thickness. Right ventricular systolic function is normal. There is mildly elevated pulmonary artery systolic pressure. The tricuspid regurgitant velocity is 2.66  m/s, and with an assumed right atrial pressure of 15 mmHg, the estimated right ventricular systolic pressure is 31.5 mmHg. Left Atrium: Left atrial size was severely dilated. Right Atrium: Right atrial size was moderately dilated. Pericardium: There is no evidence of pericardial effusion. Mitral Valve: By PISA, effective regurgitant orifice area is 0.4 cm, the regurgitant volume is 55 ml, regurgitant fraction 40%. The mitral valve is normal in structure. There is mild thickening of the mitral valve leaflet(s). Mild to moderate mitral annular calcification. Moderate to severe mitral valve regurgitation, with eccentric posteriorly directed jet. Tricuspid Valve: The tricuspid valve is normal in structure. Tricuspid valve regurgitation is mild to moderate. Aortic Valve: The aortic valve is tricuspid. There is mild calcification of the aortic valve. There is moderate thickening of the aortic valve. Aortic valve regurgitation is mild. Mild to moderate aortic valve sclerosis/calcification is present, without any evidence of aortic stenosis. Pulmonic Valve: The pulmonic valve was grossly normal. Pulmonic  valve regurgitation is not visualized. Aorta: Aortic dilatation noted. There is mild dilatation of the ascending aorta, measuring 41 mm. Venous: The inferior vena cava is dilated in size with less than 50% respiratory variability, suggesting right atrial pressure of 15 mmHg. IAS/Shunts: No atrial level shunt detected by color flow Doppler. Additional Comments: A is visualized in the right ventricle.  LEFT VENTRICLE PLAX 2D LVIDd:         5.30 cm LVIDs:         3.70 cm LV PW:         1.40 cm LV IVS:        1.80 cm LVOT diam:     2.20 cm LV SV:         79 LV SV Index:   39 LVOT Area:     3.80 cm  LV Volumes (MOD) LV vol d, MOD A2C: 253.0 ml LV vol d, MOD A4C: 227.0 ml LV vol s, MOD A2C: 148.0 ml LV vol s, MOD A4C: 143.0 ml LV SV MOD A2C:     105.0 ml LV SV MOD A4C:     227.0 ml LV SV MOD BP:      98.1 ml RIGHT VENTRICLE RV S prime:     12.00 cm/s TAPSE (M-mode): 1.5 cm LEFT ATRIUM  Index       RIGHT ATRIUM           Index LA diam:        4.90 cm  2.42 cm/m  RA Area:     20.40 cm LA Vol (A2C):   109.0 ml 53.74 ml/m RA Volume:   59.30 ml  29.24 ml/m LA Vol (A4C):   116.0 ml 57.19 ml/m LA Biplane Vol: 114.0 ml 56.21 ml/m  AORTIC VALVE LVOT Vmax:   121.00 cm/s LVOT Vmean:  95.100 cm/s LVOT VTI:    0.207 m  AORTA Ao Root diam: 3.20 cm Ao Asc diam:  4.10 cm MITRAL VALVE                 TRICUSPID VALVE MV Area (PHT): 6.96 cm      TR Peak grad:   28.3 mmHg MV Decel Time: 109 msec      TR Vmax:        266.00 cm/s MR Peak grad:    75.9 mmHg MR Mean grad:    50.0 mmHg   SHUNTS MR Vmax:         435.50 cm/s Systemic VTI:  0.21 m MR Vmean:        333.0 cm/s  Systemic Diam: 2.20 cm MR PISA:         6.28 cm MR PISA Eff ROA: 51 mm MR PISA Radius:  1.00 cm MV E velocity: 145.33 cm/s Dani Gobble Croitoru MD Electronically signed by Sanda Klein MD Signature Date/Time: 10/31/2020/2:50:12 PM    Final    [name] LT UPPER EXTREM LTD SOFT TISSUE NON VASCULAR  Result Date: 11/04/2020 CLINICAL DATA:  Av fistula, arm swelling,  query abscess. EXAM: ULTRASOUND left UPPER EXTREMITY LIMITED TECHNIQUE: Ultrasound examination of the upper extremity soft tissues was performed in the area of clinical concern. COMPARISON:  None. FINDINGS: No abnormal fluid collection or specific lesion is identified in the vicinity of the av fistula to suggest abscess or mass. IMPRESSION: No abscess or mass is identified. Electronically Signed   By: Van Clines M.D.   On: 11/04/2020 17:35   ECHO TEE  Result Date: 11/01/2020    TRANSESOPHOGEAL ECHO REPORT   Patient Name:   Travis [name] Date of Exam: 11/01/2020 Medical Rec #:  [id number]         Height:       71.0 in Accession #:    [id number]        Weight:       182.5 lb Date of Birth:  03-30-1961        BSA:          2.028 m Patient Age:    62 years          BP:           113/73 mmHg Patient Gender: M                 HR:           77 bpm. Exam Location:  Inpatient Procedure: Transesophageal Echo Indications:    endocarditis  History:        Patient has prior history of Echocardiogram examinations, most                 recent 11/01/2020. CAD; Risk Factors:Hypertension, Dyslipidemia                 and Former Smoker. STEMI.  Sonographer:    Jannett Celestine RDCS (AE) Referring Phys:  [id number] [place] [facility] CHANDRASEKHAR PROCEDURE: The transesophogeal probe was passed without difficulty through the esophogus of the patient. Sedation performed by performing physician. The patient developed no complications during the procedure. IMPRESSIONS  1. Left ventricular ejection fraction, by estimation, is 35 to 40%. The left ventricle has moderately decreased function.  2. Right ventricular systolic function is normal. The right ventricular size is normal.  3. Left atrial size was severely dilated. No left atrial/left atrial appendage thrombus was detected.  4. Right atrial size was moderately dilated.  5. The mitral valve is grossly normal. Severe mitral valve regurgitation.  6. The aortic valve is tricuspid. There is  moderate calcification of the aortic valve. Aortic valve regurgitation is moderate.  7. There is mild dilatation of the ascending aorta, measuring 40 mm. There is Moderate (Grade III) plaque.  8. The left upper pulmonary vein, left lower pulmonary vein and right upper pulmonary vein are abnormal. Conclusion(s)/Recommendation(s): There is evidence of severe mitral regurgitation; predominantely atrial functional mitral regurgitation with slight posterior tethering. There is associated moderate reduction in LV function. There is aortic regurgitation  with echodensities that could be consistent with calcification in patient with ESRD. 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 Discussed with patient and primary MD. FINDINGS  Left Ventricle: Left ventricular ejection fraction, by estimation, is 35 to 40%. The left ventricle has moderately decreased function. The left ventricular internal cavity size was normal in size. Right Ventricle: The right ventricular size is normal. Right vetricular wall thickness was not assessed. Right ventricular systolic function is normal. Left Atrium: Left atrial size was severely dilated. No left atrial/left atrial appendage thrombus was detected. Right Atrium: Right atrial size was moderately dilated. Pericardium: The pericardium was not well visualized. Mitral Valve: There is severe eccentric mitral regurgitation. The mitral valve is grossly normal. Severe mitral valve regurgitation. Tricuspid Valve: The tricuspid valve is grossly normal. Tricuspid valve regurgitation is mild. Aortic Valve: Moderate echodensity on the aortic valve on the side of the ascending aorta, favoring calification; thought cannot exclude. The aortic valve is tricuspid. There is moderate calcification of the aortic valve. Aortic valve regurgitation is moderate. Aortic regurgitation PHT measures 393 msec. Pulmonic Valve: The pulmonic valve was grossly normal. Pulmonic valve regurgitation is  not visualized. Aorta: The aortic root is normal in size and structure. There is mild dilatation of the ascending aorta, measuring 40 mm. There is moderate (Grade III) plaque. Venous: The left upper pulmonary vein, left lower pulmonary vein and right upper pulmonary vein are abnormal. IAS/Shunts: No atrial level shunt detected by color flow Doppler.  AORTIC VALVE AI PHT:      393 msec Rudean Haskell MD Electronically signed by Rudean Haskell MD Signature Date/Time: 11/01/2020/5:10:24 PM    Final    ECHOCARDIOGRAM LIMITED  Result Date: 11/13/2020    ECHOCARDIOGRAM LIMITED REPORT   Patient Name:   RAYNARD MAPPS Date of Exam: 11/13/2020 Medical Rec #:  [id number]         Height:       71.0 in Accession #:    [id number]        Weight:       183.9 lb Date of Birth:  06/16/1961        BSA:          2.035 m Patient Age:    82 years          BP:           112/68 mmHg Patient Gender:  M                 HR:           91 bpm. 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 CHF, Previous Myocardial Infarction and CAD,                 Arrythmias:Atrial Fibrillation; Risk Factors:Hypertension and                 Dyslipidemia. CKD.  Sonographer:    Clayton Lefort RDCS (AE) Referring Phys: [place]  Sonographer Comments: Limited patient mobility. IMPRESSIONS  1. Left ventricular ejection fraction, by estimation, is 45 to 50%. The left ventricle has mildly decreased function. The left ventricle demonstrates regional wall motion abnormalities (see scoring diagram/findings for description). There is severe concentric left ventricular hypertrophy. Left ventricular diastolic parameters are consistent with Grade I diastolic dysfunction (impaired relaxation).  2. Right ventricular systolic function is normal.  The right ventricular size is normal. There is normal pulmonary artery systolic pressure. The estimated right ventricular systolic pressure is 09.3 mmHg.  3. The mitral valve is degenerative. Mild mitral valve regurgitation. No evidence of mitral stenosis.  4. The aortic valve is tricuspid. There is mild calcification of the aortic valve. There is mild thickening of the aortic valve. Aortic valve regurgitation is mild. Mild aortic valve sclerosis is present, with no evidence of aortic valve stenosis.  5. The inferior vena cava is normal in size with greater than 50% respiratory variability, suggesting right atrial pressure of 3 mmHg. Comparison(s): Changes from prior study are noted. EF has improved to 45-50%. WMAs remain the mid septum into apex. RVSP has improved. FINDINGS  Left Ventricle: Left ventricular ejection fraction, by estimation, is 45 to 50%. The left ventricle has mildly decreased function. The left ventricle demonstrates regional wall motion abnormalities. The left ventricular internal cavity size was normal in size. There is severe concentric left ventricular hypertrophy. Left ventricular diastolic parameters are consistent with Grade I diastolic dysfunction (impaired relaxation).  LV Wall Scoring: The mid and distal anterior septum, mid inferoseptal segment, and apex are hypokinetic. Right Ventricle: The right ventricular size is normal. No increase in right ventricular wall thickness. Right ventricular systolic function is normal. There is normal pulmonary artery systolic pressure. The tricuspid regurgitant velocity is 1.53 m/s, and  with an assumed right atrial pressure of 3 mmHg, the estimated right ventricular systolic pressure is 26.7 mmHg. Pericardium: Trivial pericardial effusion is present. Presence of pericardial fat pad. Mitral Valve: The mitral valve is degenerative in appearance. Mild mitral annular calcification. Mild mitral valve regurgitation. No evidence of mitral valve stenosis.  Tricuspid Valve: The tricuspid valve is grossly normal. Tricuspid valve regurgitation is trivial. No evidence of tricuspid stenosis. Aortic Valve: The aortic valve is tricuspid. There is mild calcification of the aortic valve. There is mild thickening of the aortic valve. Aortic valve regurgitation is mild. Mild aortic valve sclerosis is present, with no evidence of aortic valve stenosis. Pulmonic Valve: The pulmonic valve was grossly normal. Pulmonic valve regurgitation is not visualized. No evidence of pulmonic stenosis. Venous: The inferior vena cava is normal in size with greater than 50% respiratory variability, suggesting right atrial pressure  of 3 mmHg. LEFT VENTRICLE PLAX 2D LVIDd:         4.80 cm      Diastology LVIDs:         3.30 cm      LV e' medial:    5.22 cm/s LV PW:         1.81 cm      LV E/e' medial:  15.8 LV IVS:        1.78 cm      LV e' lateral:   7.72 cm/s LVOT diam:     2.30 cm      LV E/e' lateral: 10.7 LVOT Area:     4.15 cm  LV Volumes (MOD) LV vol d, MOD A2C: 193.0 ml LV vol d, MOD A4C: 157.0 ml LV vol s, MOD A2C: 109.0 ml LV vol s, MOD A4C: 75.9 ml LV SV MOD A2C:     84.0 ml LV SV MOD A4C:     157.0 ml LV SV MOD BP:      88.0 ml IVC IVC diam: 1.40 cm LEFT ATRIUM         Index LA diam:    4.20 cm 2.06 cm/m   AORTA Ao Root diam: 4.00 cm MITRAL VALVE                TRICUSPID VALVE MV Area (PHT): 4.10 cm     TR Peak grad:   9.4 mmHg MV Decel Time: 185 msec     TR Vmax:        153.00 cm/s MR Peak grad: 75.0 mmHg MR Mean grad: 50.0 mmHg     SHUNTS MR Vmax:      433.00 cm/s   Systemic Diam: 2.30 cm MR Vmean:     332.0 cm/s MV E velocity: 82.50 cm/s MV A velocity: 135.00 cm/s MV E/A ratio:  0.61 Eleonore Chiquito MD Electronically signed by Eleonore Chiquito MD Signature Date/Time: 11/13/2020/6:05:32 PM    Final    US Abdomen Limited RUQ (LIVER/GB)  Result Date: 11/07/2020 CLINICAL DATA:  Nausea vomiting for several hours EXAM: ULTRASOUND ABDOMEN LIMITED RIGHT UPPER QUADRANT COMPARISON:  CT from  earlier in the same day. FINDINGS: Gallbladder: Gallbladder is well distended with evidence of gallbladder sludge and multiple gallbladder polyps. No stones are seen. No wall thickening or pericholecystic fluid is noted. Common bile duct: Diameter: 6.6 mm. Liver: The hepatic echogenicity is within normal limits with the exception of a focal area of increased echogenicity measuring 2.3 cm in greatest dimension. This is consistent with a small hemangioma. This is also stable from recent MRI examination. Portal vein is patent on color Doppler imaging with normal direction of blood flow towards the liver. Other: None. IMPRESSION: Gallbladder sludge and gallbladder polyps without complicating factors. Changes consistent with hepatic hemangioma. Electronically Signed   By: Inez Catalina M.D.   On: 11/07/2020 19:47    Labs:  CBC: Recent Labs    11/14/20 0413 11/14/20 1218 11/15/20 0508 11/15/20 1310 11/16/20 0538  WBC 12.0* 9.5 8.3  --  5.9  HGB 7.7* 8.8* 7.0* 8.7* 7.4*  HCT 24.5* 26.4* 21.2* 26.2* 23.3*  PLT 227 228 206  --  165    COAGS: Recent Labs    10/28/20 0629 10/30/20 0548  INR 1.3* 1.2  APTT 36  --     BMP: Recent Labs    11/13/20 1734 11/14/20 0413 11/15/20 0508 11/16/20 0538  NA 134* 132* 130* 133*  K 3.8 3.9 4.2 3.9  CL 99 98 98 97*  CO2 22 20* 19* 24  GLUCOSE 126* 122* 109* 121*  BUN 45* 63* 102* 79*  CALCIUM 8.5* 8.8* 8.9 9.1  CREATININE 5.66* 6.61* 8.06* 6.91*  GFRNONAA 11* 9* 7* 9*    LIVER FUNCTION TESTS: Recent Labs    10/28/20 0604 10/30/20 0548 10/31/20 0830 11/08/20 0347 11/08/20 1706 11/13/20 1734 11/14/20 0413 11/15/20 0508 11/16/20 0538  BILITOT 1.2 1.5*  --  1.9*  --   --   --   --   --   AST 84* 87*  --  53*  --   --   --   --   --   ALT 87* 51*  --  5  --   --   --   --   --   ALKPHOS 146* 95  --  137*  --   --   --   --   --   PROT 6.6 5.6*  --  5.8*  --   --   --   --   --   ALBUMIN 3.1* 2.2*   < > 1.3*   < > 1.2* 1.1* 1.1* 1.2*    < > = values in this interval not displayed.     Assessment and Plan:  History of ESRD on HD via LUE AVF (with nontunneled right IJ HD catheter in place, placed 11/01/2020 by CCM), with reported clots from AVF during dialysis yesterday. Plan for image-guided fistulogram with possible intervention, with possible image-guided tunneled HD catheter placement in IR tentatively for tomorrow pending IR scheduling. Patient will be NPO at midnight including all tube feeds. Afebrile.  Spoke with patient's wife, Braydan Marriott, via telephone per patient's request to update on patient's planned procedure.  Risks and benefits discussed with the patient including, but not limited to bleeding, infection, vascular injury, pulmonary embolism, need for tunneled HD catheter placement or even death. All of the patient's questions were answered, patient is agreeable to proceed. Consent signed and in chart.   Thank you for this interesting consult.  I greatly enjoyed meeting JAKOBE BLAU and look forward to participating in their care.  A copy of this report was sent to the requesting provider on this date.  Electronically Signed: Earley Abide, PA-C 11/16/2020, 12:19 PM   I spent a total of 40 Minutes in face to face in clinical consultation, greater than 50% of which was counseling/coordinating care for AVF malfunction/fistulogram with possible intervention.

## 2020-11-16 NOTE — Progress Notes (Signed)
Physical Therapy Treatment Patient Details Name: Travis [name] MRN: [id number] DOB: 1961-05-23 Today's Date: 11/16/2020    History of Present Illness 59 y.o. male with past medical history of end-stage renal disease on hemodialysis, essential hypertension, dilated ascending aorta and hyperlipidemia who presented on November 20 with anterior ST elevation myocardial infarction.  VDRF 11/23-12/3.  Stent placed 11/20.  Afib with RVR with cardioversion 11/21. Suspected cholecystolithiasis 11/29.      PT Comments    Pt admitted with above diagnosis. Pt was  In chair on arrival and nursing asked PT if we could assist pt back into the bed.  Did some sitting balance unsupported in chair with pt very painful on his buttocks.  Pt at times can lean forward with min guard assist and other times needing mod assist. Assisted pt back to bed with Maximove and rolled to clean pt as his flexi seal leaked.  Pt on his side and comfortable on departure.  Pt currently with functional limitations due to balance and endurance deficits. Pt will benefit from skilled PT to increase their independence and safety with mobility to allow discharge to the venue listed below.     Follow Up Recommendations  CIR;Supervision/Assistance - 24 hour     Equipment Recommendations  Wheelchair (measurements PT);Wheelchair cushion (measurements PT)    Recommendations for Other Services       Precautions / Restrictions Precautions Precautions: Fall Restrictions Weight Bearing Restrictions: No    Mobility  Bed Mobility Overal bed mobility: Needs Assistance Bed Mobility: Rolling Rolling: Min assist;Mod assist         General bed mobility comments: Min assist to roll left assisting with bending of left LE and reaching UE to rail.  Mod assist to roll right with same assist.  Transfers                 General transfer comment: unable  Ambulation/Gait             General Gait Details: unable   Stairs              Wheelchair Mobility    Modified Rankin (Stroke Patients Only)       Balance Overall balance assessment: Needs assistance Sitting-balance support: No upper extremity supported;Feet supported;Bilateral upper extremity supported Sitting balance-Leahy Scale: Poor Sitting balance - Comments: From the recliner: at times he was able to pull himself to lean forward and attempt to adjust his positioning off his bottom.  At other times up to Mod A to lean forward or side to side. Postural control: Posterior lean;Right lateral lean;Left lateral lean                                  Cognition Arousal/Alertness: Awake/alert Behavior During Therapy: Flat affect Overall Cognitive Status: Impaired/Different from baseline Area of Impairment: Memory;Following commands;Safety/judgement;Awareness;Problem solving;Orientation;Attention                 Orientation Level: Disoriented to;Time;Situation;Place Current Attention Level: Focused;Sustained Memory: Decreased short-term memory Following Commands: Follows one step commands inconsistently;Follows one step commands with increased time Safety/Judgement: Decreased awareness of safety;Decreased awareness of deficits Awareness: Intellectual Problem Solving: Difficulty sequencing;Requires verbal cues;Requires tactile cues;Decreased initiation;Slow processing General Comments: Responding to commands with increased time. Making eye contact. Sustaining attention to simple actions. More engaged and attempting to perform neck ROM. Limited by pain at bottom impacting performance.      Exercises General Exercises - Lower  Extremity Ankle Circles/Pumps: AAROM;Both;5 reps;Supine Long Arc Quad: AAROM;Both;5 reps;Seated Hip Flexion/Marching: AAROM;Both;10 reps;Supine    General Comments General comments (skin integrity, edema, etc.): VSS      Pertinent Vitals/Pain Pain Assessment: Faces Faces Pain Scale: Hurts whole  lot Pain Location: bottom and peri area Pain Descriptors / Indicators: Discomfort;Guarding;Restless Pain Intervention(s): Limited activity within patient's tolerance;Monitored during session;Repositioned    Home Living                      Prior Function            PT Goals (current goals can now be found in the care plan section) Acute Rehab PT Goals Patient Stated Goal: to go home Progress towards PT goals: Progressing toward goals    Frequency    Min 3X/week      PT Plan Current plan remains appropriate    Co-evaluation PT/OT/SLP Co-Evaluation/Treatment: Yes Reason for Co-Treatment: Complexity of the patient's impairments (multi-system involvement);For patient/therapist safety PT goals addressed during session: Mobility/safety with mobility OT goals addressed during session: ADL's and self-care      AM-PAC PT "6 Clicks" Mobility   Outcome Measure  Help needed turning from your back to your side while in a flat bed without using bedrails?: Total Help needed moving from lying on your back to sitting on the side of a flat bed without using bedrails?: Total Help needed moving to and from a bed to a chair (including a wheelchair)?: Total Help needed standing up from a chair using your arms (e.g., wheelchair or bedside chair)?: Total Help needed to walk in hospital room?: Total Help needed climbing 3-5 steps with a railing? : Total 6 Click Score: 6    End of Session Equipment Utilized During Treatment: Oxygen Activity Tolerance: Patient limited by fatigue Patient left: in bed;with call bell/phone within reach;with SCD's reapplied;with family/visitor present;with restraints reapplied Nurse Communication: Mobility status;Need for lift equipment PT Visit Diagnosis: Muscle weakness (generalized) (M62.81)     Time: 2010-0712 PT Time Calculation (min) (ACUTE ONLY): 37 min  Charges:  $Therapeutic Activity: 8-22 mins                     Jayanna Kroeger W,PT Acute  Rehabilitation Services Pager:  [phone]  Office:  [place] 11/16/2020, 2:04 PM

## 2020-11-17 ENCOUNTER — Inpatient Hospital Stay (HOSPITAL_COMMUNITY): Payer: Medicare Other

## 2020-11-17 LAB — BPAM RBC
Blood Product Expiration Date: 202112292359
Blood Product Expiration Date: 202201052359
Blood Product Expiration Date: 202201122359
Blood Product Expiration Date: 202201132359
ISSUE DATE / TIME: 202112080859
ISSUE DATE / TIME: 202112080859
ISSUE DATE / TIME: 202112091120
ISSUE DATE / TIME: 202112091712
Unit Type and Rh: 5100
Unit Type and Rh: 5100
Unit Type and Rh: 5100
Unit Type and Rh: 5100

## 2020-11-17 LAB — CBC WITH DIFFERENTIAL/PLATELET
Abs Immature Granulocytes: 0.03 10*3/uL (ref 0.00–0.07)
Basophils Absolute: 0.1 10*3/uL (ref 0.0–0.1)
Basophils Relative: 1 %
Eosinophils Absolute: 0.4 10*3/uL (ref 0.0–0.5)
Eosinophils Relative: 5 %
HCT: 27.5 % — ABNORMAL LOW (ref 39.0–52.0)
Hemoglobin: 8.8 g/dL — ABNORMAL LOW (ref 13.0–17.0)
Immature Granulocytes: 0 %
Lymphocytes Relative: 8 %
Lymphs Abs: 0.6 10*3/uL — ABNORMAL LOW (ref 0.7–4.0)
MCH: 29.3 pg (ref 26.0–34.0)
MCHC: 32 g/dL (ref 30.0–36.0)
MCV: 91.7 fL (ref 80.0–100.0)
Monocytes Absolute: 0.7 10*3/uL (ref 0.1–1.0)
Monocytes Relative: 10 %
Neutro Abs: 5.3 10*3/uL (ref 1.7–7.7)
Neutrophils Relative %: 76 %
Platelets: 175 10*3/uL (ref 150–400)
RBC: 3 MIL/uL — ABNORMAL LOW (ref 4.22–5.81)
RDW: 16.1 % — ABNORMAL HIGH (ref 11.5–15.5)
WBC: 6.9 10*3/uL (ref 4.0–10.5)
nRBC: 0 % (ref 0.0–0.2)

## 2020-11-17 LAB — TYPE AND SCREEN
ABO/RH(D): O POS
Antibody Screen: NEGATIVE
Unit division: 0
Unit division: 0
Unit division: 0
Unit division: 0

## 2020-11-17 LAB — CBC
HCT: 25.7 % — ABNORMAL LOW (ref 39.0–52.0)
Hemoglobin: 8.8 g/dL — ABNORMAL LOW (ref 13.0–17.0)
MCH: 30.2 pg (ref 26.0–34.0)
MCHC: 34.2 g/dL (ref 30.0–36.0)
MCV: 88.3 fL (ref 80.0–100.0)
Platelets: 200 10*3/uL (ref 150–400)
RBC: 2.91 MIL/uL — ABNORMAL LOW (ref 4.22–5.81)
RDW: 15.9 % — ABNORMAL HIGH (ref 11.5–15.5)
WBC: 7.6 10*3/uL (ref 4.0–10.5)
nRBC: 0 % (ref 0.0–0.2)

## 2020-11-17 LAB — GLUCOSE, CAPILLARY
Glucose-Capillary: 95 mg/dL (ref 70–99)
Glucose-Capillary: 79 mg/dL (ref 70–99)
Glucose-Capillary: 75 mg/dL (ref 70–99)
Glucose-Capillary: 91 mg/dL (ref 70–99)
Glucose-Capillary: 129 mg/dL — ABNORMAL HIGH (ref 70–99)
Glucose-Capillary: 95 mg/dL (ref 70–99)

## 2020-11-17 LAB — RENAL FUNCTION PANEL
Albumin: 1.3 g/dL — ABNORMAL LOW (ref 3.5–5.0)
Anion gap: 16 — ABNORMAL HIGH (ref 5–15)
BUN: 108 mg/dL — ABNORMAL HIGH (ref 6–20)
CO2: 22 mmol/L (ref 22–32)
Calcium: 9.2 mg/dL (ref 8.9–10.3)
Chloride: 97 mmol/L — ABNORMAL LOW (ref 98–111)
Creatinine, Ser: 8.63 mg/dL — ABNORMAL HIGH (ref 0.61–1.24)
GFR, Estimated: 7 mL/min — ABNORMAL LOW (ref >60)
Glucose, Bld: 91 mg/dL (ref 70–99)
Phosphorus: 6.9 mg/dL — ABNORMAL HIGH (ref 2.5–4.6)
Potassium: 4.7 mmol/L (ref 3.5–5.1)
Sodium: 135 mmol/L (ref 135–145)

## 2020-11-17 LAB — MAGNESIUM: Magnesium: 2 mg/dL (ref 1.7–2.4)

## 2020-11-17 MED ORDER — QUETIAPINE FUMARATE 25 MG PO TABS
25.0000 mg | ORAL_TABLET | Freq: Two times a day (BID) | ORAL | Status: DC
  Administered 2020-11-17 – 2020-11-18 (×3): 25 mg
  Filled 2020-11-17 (×2): qty 1

## 2020-11-17 MED ORDER — VITAL 1.5 CAL PO LIQD
1000.0000 mL | ORAL | Status: DC
  Administered 2020-11-17: 1000 mL

## 2020-11-17 MED ORDER — SODIUM CHLORIDE 0.9 % IV SOLN: 100.0000 mL | INTRAVENOUS | Status: DC | PRN

## 2020-11-17 MED ORDER — LIDOCAINE HCL (PF) 1 % IJ SOLN: 5.0000 mL | INTRAMUSCULAR | Status: DC | PRN

## 2020-11-17 MED ORDER — HEPARIN SODIUM (PORCINE) 1000 UNIT/ML DIALYSIS
1000.0000 [IU] | INTRAMUSCULAR | Status: DC | PRN
  Filled 2020-11-17: qty 1

## 2020-11-17 MED ORDER — CLONAZEPAM 0.5 MG PO TBDP
0.5000 mg | ORAL_TABLET | Freq: Two times a day (BID) | ORAL | Status: DC
  Administered 2020-11-17 – 2020-11-18 (×3): 0.5 mg
  Filled 2020-11-17 (×2): qty 1

## 2020-11-17 MED ORDER — MIDODRINE HCL 5 MG PO TABS
5.0000 mg | ORAL_TABLET | Freq: Three times a day (TID) | ORAL | Status: DC
  Administered 2020-11-17 – 2020-11-20 (×10): 5 mg
  Filled 2020-11-17 (×10): qty 1

## 2020-11-17 MED ORDER — ALTEPLASE 2 MG IJ SOLR: 2.0000 mg | Freq: Once | INTRAMUSCULAR | Status: DC | PRN

## 2020-11-17 MED ORDER — LIDOCAINE-PRILOCAINE 2.5-2.5 % EX CREA: 1.0000 "application " | TOPICAL_CREAM | CUTANEOUS | Status: DC | PRN

## 2020-11-17 MED ORDER — ENSURE ENLIVE PO LIQD: 237.0000 mL | Freq: Two times a day (BID) | ORAL | Status: DC

## 2020-11-17 MED ORDER — HEPARIN SODIUM (PORCINE) 1000 UNIT/ML IJ SOLN
INTRAMUSCULAR | Status: AC
  Filled 2020-11-17: qty 4

## 2020-11-17 MED ORDER — PROSOURCE TF PO LIQD
45.0000 mL | Freq: Four times a day (QID) | ORAL | Status: DC
  Administered 2020-11-17 – 2020-11-18 (×3): 45 mL
  Filled 2020-11-17 (×3): qty 45

## 2020-11-17 MED ORDER — FLORANEX PO PACK
1.0000 g | PACK | Freq: Three times a day (TID) | ORAL | Status: DC
  Filled 2020-11-17: qty 1

## 2020-11-17 MED ORDER — PENTAFLUOROPROP-TETRAFLUOROETH EX AERO: 1.0000 "application " | INHALATION_SPRAY | CUTANEOUS | Status: DC | PRN

## 2020-11-17 MED ORDER — FLORANEX PO PACK
1.0000 g | PACK | Freq: Three times a day (TID) | ORAL | Status: DC
  Administered 2020-11-17 – 2020-11-24 (×20): 1 g
  Filled 2020-11-17 (×26): qty 1

## 2020-11-17 NOTE — Progress Notes (Signed)
IR consulted by Dr. Carolin Sicks for possible image-guided fistulogram with possible intervention.  Patient has been seen/consented for procedure (please see my consult note from 11/16/2020 for further information on this).  Patient was called by IR for procedure this afternoon, however per RN patient just started dialysis in room at approximately 1445 and will take approximately 2 hours to complete- unable to accommodate patient's procedure today due to this and IR scheduling. Plan for possible image-guided fistulogram with possible intervention in IR tentatively for Monday 11/20/2020 pending IR scheduling. Patient's diet restarted for today, patient will be NPO at midnight prior to procedure. Benjamine Mola, RN aware of above.  Please call IR with questions/concerns.   Bea Graff Rejina Odle, PA-C 11/17/2020, 3:00 PM

## 2020-11-17 NOTE — Progress Notes (Signed)
Progress Note  Patient Name: Travis [name] Date of Encounter: 11/17/2020  [facility] Cardiologist: Glenetta Hew, MD   Subjective   No events overnight. No complaints  Inpatient Medications    Scheduled Meds: . sodium chloride   Intravenous Once  . amiodarone  200 mg Per Tube Daily  . atorvastatin  80 mg Per Tube q1800  . chlorhexidine  15 mL Mouth Rinse BID  . Chlorhexidine Gluconate Cloth  6 each Topical Q0600  . Chlorhexidine Gluconate Cloth  6 each Topical Q0600  . clonazepam  1 mg Per Tube BID  . collagenase   Topical Daily  . doxercalciferol  5 mcg Intravenous Q M,W,F  . feeding supplement (PROSource TF)  90 mL Per Tube QID  . guaiFENesin  15 mL Per Tube Q4H  . insulin aspart  0-6 Units Subcutaneous Q4H  . lidocaine  1 patch Transdermal Q24H  . mouth rinse  15 mL Mouth Rinse q12n4p  . midodrine  10 mg Per Tube TID WC  . multivitamin  1 tablet Oral QHS  . nutrition supplement (JUVEN)  1 packet Per Tube BID BM  . [START ON 11/18/2020] pantoprazole  40 mg Intravenous Q12H  . QUEtiapine  50 mg Per Tube BID  . sevelamer carbonate  1.6 g Per Tube TID WC  . sodium chloride flush  10 mL Intracatheter Q8H  . sodium chloride flush  10-40 mL Intracatheter Q12H  . sodium chloride flush  3 mL Intravenous Q12H  . sucralfate  1 g Per Tube QID  . ticagrelor  90 mg Per Tube BID   Continuous Infusions: . sodium chloride Stopped (11/10/20 1944)  . sodium chloride Stopped (10/28/20 0954)  . sodium chloride Stopped (11/04/20 0016)  . sodium chloride    . sodium chloride Stopped (11/14/20 1600)  .  ceFAZolin (ANCEF) IV 2 g (11/15/20 1228)  . dexmedetomidine (PRECEDEX) IV infusion Stopped (11/14/20 1103)  . feeding supplement (VITAL 1.5 CAL) Stopped (11/17/20 0400)  . norepinephrine (LEVOPHED) Adult infusion Stopped (11/13/20 0428)  . pantoprozole (PROTONIX) infusion 8 mg/hr (11/17/20 0600)   PRN Meds: sodium chloride, sodium chloride, sodium chloride, acetaminophen,  alteplase, haloperidol lactate, heparin, lidocaine (PF), lidocaine-prilocaine, LORazepam, ondansetron (ZOFRAN) IV, oxyCODONE, pentafluoroprop-tetrafluoroeth, Resource ThickenUp Clear, sodium chloride, sodium chloride flush, sodium chloride flush   Vital Signs    Vitals:   11/17/20 0426 11/17/20 0500 11/17/20 0600 11/17/20 0700  BP:  (!) 125/51 (!) 146/73 134/70  Pulse:  (!) 102 88 87  Resp:  (!) 26 (!) 28 (!) 28  Temp: 99.3 F (37.4 C)     TempSrc: Axillary     SpO2:  100% 99% 96%  Weight:  82.4 kg    Height:        Intake/Output Summary (Last 24 hours) at 11/17/2020 0732 Last data filed at 11/17/2020 0600 Gross per 24 hour  Intake 2419.07 ml  Output 1610 ml  Net 809.07 ml   Last 3 Weights 11/17/2020 11/16/2020 11/15/2020  Weight (lbs) 181 lb 10.5 oz 177 lb 14.6 oz 184 lb 15.5 oz  Weight (kg) 82.4 kg 80.7 kg 83.9 kg      Telemetry    sinus- Personally Reviewed  ECG    NSR, poor R wave progression-personally reviewed.   Physical Exam    General: Well developed, well nourished, ill appearing HEENT: OP clear Neuro: No focal deficits  Neck: No JVD Lungs:Clear bilaterally, no wheezes, rhonci, crackles Cardiovascular: Regular rate and rhythm. No murmurs, gallops or rubs. Abdomen:Soft.  Bowel sounds present.  Extremities: No lower extremity edema.   Labs    High Sensitivity Troponin:   Recent Labs  Lab 10/28/20 0629 10/28/20 1046 10/28/20 2040 10/29/20 2121 10/30/20 0548  TROPONINIHS 3,155* >27,000* >27,000* >27,000* >27,000*      Chemistry Recent Labs  Lab 11/15/20 0508 11/16/20 0538 11/16/20 1510 11/16/20 1628 11/17/20 0556  NA 130* 133* 133*  --  135  K 4.2 3.9 4.3  --  4.7  CL 98 97*  --   --  97*  CO2 19* 24  --   --  22  GLUCOSE 109* 121*  --   --  91  BUN 102* 79*  --   --  108*  CREATININE 8.06* 6.91*  --   --  8.63*  CALCIUM 8.9 9.1  --   --  9.2  PROT  --   --   --  5.4*  --   ALBUMIN 1.1* 1.2*  --   --  1.3*  GFRNONAA 7* 9*  --   --   7*  ANIONGAP 13 12  --   --  16*     Hematology Recent Labs  Lab 11/15/20 0508 11/15/20 1310 11/16/20 0538 11/16/20 1510 11/16/20 1628 11/17/20 0556  WBC 8.3  --  5.9  --   --  6.9  RBC 2.30*  --  2.55*  --   --  3.00*  HGB 7.0*   < > 7.4* 8.5*  --  8.8*  HCT 21.2*   < > 23.3* 25.0* 27.2* 27.5*  MCV 92.2  --  91.4  --   --  91.7  MCH 30.4  --  29.0  --   --  29.3  MCHC 33.0  --  31.8  --   --  32.0  RDW 17.2*  --  16.5*  --   --  16.1*  PLT 206  --  165  --   --  175   < > = values in this interval not displayed.    BNPNo results for input(s): BNP, PROBNP in the last 168 hours.   DDimer No results for input(s): DDIMER in the last 168 hours.   Radiology    CT ABDOMEN PELVIS WO CONTRAST  Result Date: 11/15/2020 CLINICAL DATA:  59 year old male with bacteremia last month, end stage renal disease on dialysis. Severe esophagitis and duodenal ulcers on recent EGD. Continued drop in hemoglobin with liquid black stool suspicious for ongoing upper GI blood loss. EXAM: CT ABDOMEN AND PELVIS WITHOUT CONTRAST TECHNIQUE: Multidetector CT imaging of the abdomen and pelvis was performed following the standard protocol without IV contrast. COMPARISON:  CT Abdomen and Pelvis 11/07/2020. FINDINGS: Lower chest: Increased layering pleural effusions at the lung bases, small. Continued bilateral lower lobe consolidation, although more suggestive of atelectasis than pneumonia on previous contrast enhanced CT. Overall stable lung base ventilation. Stable mild cardiomegaly. No pericardial effusion. Hepatobiliary: Contracted gallbladder now with mild pericholecystic fluid and/or gallbladder wall thickening (series 3, image 32). No other regional inflammation. Negative noncontrast liver. Pancreas: Negative noncontrast pancreas. Spleen: Negative. Adrenals/Urinary Tract: No retroperitoneal hematoma. Stable native left renal atrophy. Absent right kidney. Thickening of the adrenal glands is stable compatible with  adrenal hyperplasia. Completely decompressed urinary bladder as before. Stomach/Bowel: Rectal tube in place. Intermittent diverticulosis of the large bowel, moderate overall with most segments affected. Some residual oral contrast within the large bowel and especially diverticula. No large bowel inflammation identified. Normal appendix (series 3, image 52). No  dilated small bowel.  No free air or free fluid. Enteric tube terminates at the gastric antrum. Unremarkable visible distal esophagus, stomach and duodenum. Vascular/Lymphatic: Widespread calcified atherosclerosis, including involvement of the aorta. Normal caliber abdominal aorta. Vascular patency is not evaluated in the absence of IV contrast. No lymphadenopathy. Reproductive: Negative. Other: Generalized increased subcutaneous body wall edema since November (series 3, image 49 today). Similar increased but mild presacral stranding. No pelvic free fluid. Musculoskeletal: Lower thoracic spina bifida occulta (normal variant). 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 No acute osseous abnormality identified. IMPRESSION: 1. Negative for retroperitoneal bleeding or other hematoma collection in the abdomen or pelvis. 2. A degree of anasarca has developed since 11/07/2020, including new body wall edema and small layering pleural effusions. Contracted gallbladder with new mild pericholecystic fluid and/or wall edema also suspected due to anasarca. 3. Enteric tube terminates in the distal stomach. Rectal tube in place. No bowel inflammation is evident. Widespread large bowel diverticulosis. 4. Continued bilateral lower lobe lung consolidation, although more suggestive of atelectasis than pneumonia on prior CT. 5. Aortic Atherosclerosis (ICD10-I70.0). Electronically Signed   By: Genevie Ann M.D.   On: 11/15/2020 23:45   DG CHEST PORT 1 VIEW  Result Date: 11/16/2020 CLINICAL DATA:  End-stage renal disease.  Chest tube placement. EXAM: PORTABLE CHEST 1 VIEW COMPARISON:  Multiple  exams, including 11/08/2020 and 11/14/2020 FINDINGS: Substantial reduction in the right pleural density favoring successful drainage of pleural effusion, status post right pleural pigtail catheter placement. 5% right pneumothorax. Right internal jugular dialysis catheter tip: Right atrium. Left central line tip: Brachiocephalic confluence. Feeding tube catheter enters the stomach. Atherosclerotic calcification of the aortic arch. Moderate enlargement of the cardiopericardial silhouette Mild atelectasis in both lower lobes. Suspected small left pleural effusion. IMPRESSION: 1. Substantial reduction in the right pleural density favoring successful drainage of pleural effusion, status post right pleural pigtail catheter placement. 5% right pneumothorax. 2. Moderately enlarged cardiopericardial silhouette. 3. Small left pleural effusion with mild bibasilar atelectasis. Electronically Signed   By: Van Clines M.D.   On: 11/16/2020 16:36    Cardiac Studies     Patient Profile     59 y.o. male with ESRD on HD, HTN, tobacco abuse, CAD  presenting with anterior STEMI 10/28/20 s/p placement of a stent in the LAD with residual disease in the RCA and Circumflex. He developed atrial fib with RVR. Hospital course complicated by MSSA bacteremia with shock, upper GI bleeding secondary to severe esophagitis and duodenal ulcer, ICU delirium, metabolic encephalopathy, acute hypoxemic respiratory failure, anemia.   Assessment & Plan    1) CAD/Anterior STEMI: He has no chest pain. Optimal to continue Brilinta if possible if his H/H remains stable. Continue statin. No ASA with GI bleeding. Plan for staged PCI of the RCA when other issues stable.    2) ESRD: Dialysis per Nephrology  3) Shock: Multi-factorial. BP stable off of pressors   4) Atrial fibrillation: Sinus today. Continue oral amiodarone. Not a candidate for anti-coagulation.    5) Ischemic cardiomyopathy/Acute on chronic systolic CHF: ZOXW=96-04% by  echo 11/01/20. Fluid removal with HD.  -would add a beta blocker now that his BP is stable. (Coreg 3.25 mg po BID)  6. Anemia: H/H stable post transfusion yesterday. Likely GI bleeding. GI following.    For questions or updates, please contact [place] Please consult [email or web address] for contact info under        Signed, Lauree Chandler, MD  11/17/2020, 7:32 AM

## 2020-11-17 NOTE — Progress Notes (Signed)
[facility] NEPHROLOGY PROGRESS NOTE  Assessment/ Plan: Pt is a 59 y.o. yo male HTN, ESRD on HD admitted on 11/20 with fever, myalgia found to have NSTEMI, complicated by A. fib with RVR, bacteremia, GI bleed and VDRF.  OP HD:AF MWF 4h 450/800 81.5kg 2/2.25 bath RUE AVF Hep 5000+ 1000 mid-run - hect 5 ug tiw  - mircera 50 ugIVq 4 weeks, last 11/15 (due 11/29)  # ESRD - usual HD MWF.  Required CRRT on and off from 11/24-12/4.  Tolerating intermittent hemodialysis.  Plan for next dialysis today.  AV fistula is clotted therefore IR is planning to declot today.  He has temporary HD catheter if needed for urgent dialysis.  Unable to use heparin because of GI bleed/anemia.  # Acute STEMI- S/P LAD PCI11/20, has otherdisease inLCx/ RCA.Transitioned to brilinta from cangrelor. Per cardiology.  #MSSA cavitary PNA / bacteremia-intubated 11/23. Per PCCM. Respiratory culture 11/23 with MSSA. Blood cx +11/21.CT abd/ pelvis negative 11/23.F/U bcx's negative.Dialysis duplex 11/22 without abscessandUS of AVF wasnegative 11/27. Plan for 6 weeks of IV cefazolin per pharmacy ending 12/30/2020 .  #PEA arrest: occurred in setting of agitation/ sedation.Brief CPR.  # Shock - resolved, now on midodrine.  #Anemia ckd/ABLA -off of anticoagulation.  Received blood transfusion, seen by GI.  #Secondary hyperparathyroidism: Increased sevelamer.  Monitor phosphorus level.  #Hyponatremia now managed with dialysis.  #Afib:on amio and hep gtt, s/p DCCV.   #Severe MR:per cardiology.  Subjective: Seen and examined ICU.  Blood pressure acceptable, off pressor.  Plan for IR access declot today.  His daughter at bedside. Objective Vital signs in last 24 hours: Vitals:   11/17/20 0600 11/17/20 0700 11/17/20 0800 11/17/20 0807  BP: (!) 146/73 134/70 123/67   Pulse: 88 87 92   Resp: (!) 28 (!) 28 (!) 33   Temp:    98.5 F (36.9 C)  TempSrc:    Axillary  SpO2:  99% 96% 97%   Weight:      Height:       Weight change: -1.5 kg  Intake/Output Summary (Last 24 hours) at 11/17/2020 0917 Last data filed at 11/17/2020 0800 Gross per 24 hour  Intake 2350.15 ml  Output 1630 ml  Net 720.15 ml       Labs: Basic Metabolic Panel: Recent Labs  Lab 11/15/20 0508 11/16/20 0538 11/16/20 1510 11/17/20 0556  NA 130* 133* 133* 135  K 4.2 3.9 4.3 4.7  CL 98 97*  --  97*  CO2 19* 24  --  22  GLUCOSE 109* 121*  --  91  BUN 102* 79*  --  108*  CREATININE 8.06* 6.91*  --  8.63*  CALCIUM 8.9 9.1  --  9.2  PHOS 5.9* 5.4*  --  6.9*   Liver Function Tests: Recent Labs  Lab 11/15/20 0508 11/16/20 0538 11/16/20 1628 11/17/20 0556  PROT  --   --  5.4*  --   ALBUMIN 1.1* 1.2*  --  1.3*   No results for input(s): LIPASE, AMYLASE in the last 168 hours. No results for input(s): AMMONIA in the last 168 hours. CBC: Recent Labs  Lab 11/14/20 0413 11/14/20 1218 11/15/20 0508 11/15/20 1310 11/16/20 0538 11/16/20 1510 11/16/20 1628 11/17/20 0556  WBC 12.0* 9.5 8.3  --  5.9  --   --  6.9  NEUTROABS  --   --   --   --   --   --   --  5.3  HGB 7.7* 8.8* 7.0*   < >  7.4* 8.5*  --  8.8*  HCT 24.5* 26.4* 21.2*   < > 23.3* 25.0* 27.2* 27.5*  MCV 94.2 91.7 92.2  --  91.4  --   --  91.7  PLT 227 228 206  --  165  --   --  175   < > = values in this interval not displayed.   Cardiac Enzymes: No results for input(s): CKTOTAL, CKMB, CKMBINDEX, TROPONINI in the last 168 hours. CBG: Recent Labs  Lab 11/16/20 1558 11/16/20 2018 11/16/20 2351 11/17/20 0400 11/17/20 0604  GLUCAP 89 113* 89 95 79    Iron Studies: No results for input(s): IRON, TIBC, TRANSFERRIN, FERRITIN in the last 72 hours. Studies/Results: CT ABDOMEN PELVIS WO CONTRAST  Result Date: 11/15/2020 CLINICAL DATA:  59 year old male with bacteremia last month, end stage renal disease on dialysis. Severe esophagitis and duodenal ulcers on recent EGD. Continued drop in hemoglobin with liquid  black stool suspicious for ongoing upper GI blood loss. EXAM: CT ABDOMEN AND PELVIS WITHOUT CONTRAST TECHNIQUE: Multidetector CT imaging of the abdomen and pelvis was performed following the standard protocol without IV contrast. COMPARISON:  CT Abdomen and Pelvis 11/07/2020. FINDINGS: Lower chest: Increased layering pleural effusions at the lung bases, small. Continued bilateral lower lobe consolidation, although more suggestive of atelectasis than pneumonia on previous contrast enhanced CT. Overall stable lung base ventilation. Stable mild cardiomegaly. No pericardial effusion. Hepatobiliary: Contracted gallbladder now with mild pericholecystic fluid and/or gallbladder wall thickening (series 3, image 32). No other regional inflammation. Negative noncontrast liver. Pancreas: Negative noncontrast pancreas. Spleen: Negative. Adrenals/Urinary Tract: No retroperitoneal hematoma. Stable native left renal atrophy. Absent right kidney. Thickening of the adrenal glands is stable compatible with adrenal hyperplasia. Completely decompressed urinary bladder as before. Stomach/Bowel: Rectal tube in place. Intermittent diverticulosis of the large bowel, moderate overall with most segments affected. Some residual oral contrast within the large bowel and especially diverticula. No large bowel inflammation identified. Normal appendix (series 3, image 52). No dilated small bowel.  No free air or free fluid. Enteric tube terminates at the gastric antrum. Unremarkable visible distal esophagus, stomach and duodenum. Vascular/Lymphatic: Widespread calcified atherosclerosis, including involvement of the aorta. Normal caliber abdominal aorta. Vascular patency is not evaluated in the absence of IV contrast. No lymphadenopathy. Reproductive: Negative. Other: Generalized increased subcutaneous body wall edema since November (series 3, image 49 today). Similar increased but mild presacral stranding. No pelvic free fluid. Musculoskeletal:  Lower thoracic spina bifida occulta (normal variant). 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 No acute osseous abnormality identified. IMPRESSION: 1. Negative for retroperitoneal bleeding or other hematoma collection in the abdomen or pelvis. 2. A degree of anasarca has developed since 11/07/2020, including new body wall edema and small layering pleural effusions. Contracted gallbladder with new mild pericholecystic fluid and/or wall edema also suspected due to anasarca. 3. Enteric tube terminates in the distal stomach. Rectal tube in place. No bowel inflammation is evident. Widespread large bowel diverticulosis. 4. Continued bilateral lower lobe lung consolidation, although more suggestive of atelectasis than pneumonia on prior CT. 5. Aortic Atherosclerosis (ICD10-I70.0). Electronically Signed   By: Genevie Ann M.D.   On: 11/15/2020 23:45   DG Chest Port 1 View  Result Date: 11/17/2020 CLINICAL DATA:  Chest tube.  Abnormal respirations. EXAM: PORTABLE CHEST 1 VIEW COMPARISON:  11/16/2020. FINDINGS: Right chest tube in stable position. Interim near complete resolution of right pneumothorax with miniscule right apical residual. Feeding tube, right IJ line, and left subclavian line stable position. Stable severe cardiomegaly. Pulmonary venous congestion  progressive bibasilar atelectasis and infiltrates/edema. Small right pleural effusion. Stable left apical pleural thickening. Left costophrenic angle incompletely imaged. IMPRESSION: 1. Right chest tube in stable position. Interim near complete resolution of right pneumothorax with miniscule right apical residual. 2. Feeding tube, right IJ line, and left subclavian line stable position. 3. Progressive bibasilar atelectasis and infiltrates/edema. Small right pleural effusion. 4. Stable severe cardiomegaly. Pulmonary venous congestion. A component of CHF may be present. Electronically Signed   By: Marcello Moores  Register   On: 11/17/2020 07:49   DG CHEST PORT 1 VIEW  Result Date:  11/16/2020 CLINICAL DATA:  End-stage renal disease.  Chest tube placement. EXAM: PORTABLE CHEST 1 VIEW COMPARISON:  Multiple exams, including 11/08/2020 and 11/14/2020 FINDINGS: Substantial reduction in the right pleural density favoring successful drainage of pleural effusion, status post right pleural pigtail catheter placement. 5% right pneumothorax. Right internal jugular dialysis catheter tip: Right atrium. Left central line tip: Brachiocephalic confluence. Feeding tube catheter enters the stomach. Atherosclerotic calcification of the aortic arch. Moderate enlargement of the cardiopericardial silhouette Mild atelectasis in both lower lobes. Suspected small left pleural effusion. IMPRESSION: 1. Substantial reduction in the right pleural density favoring successful drainage of pleural effusion, status post right pleural pigtail catheter placement. 5% right pneumothorax. 2. Moderately enlarged cardiopericardial silhouette. 3. Small left pleural effusion with mild bibasilar atelectasis. Electronically Signed   By: Van Clines M.D.   On: 11/16/2020 16:36    Medications: Infusions: . sodium chloride Stopped (11/10/20 1944)  . sodium chloride Stopped (10/28/20 0954)  . sodium chloride Stopped (11/04/20 0016)  . sodium chloride    . sodium chloride Stopped (11/14/20 1600)  .  ceFAZolin (ANCEF) IV 2 g (11/15/20 1228)  . dexmedetomidine (PRECEDEX) IV infusion Stopped (11/14/20 1103)  . feeding supplement (VITAL 1.5 CAL) Stopped (11/17/20 0400)  . norepinephrine (LEVOPHED) Adult infusion Stopped (11/13/20 0428)  . pantoprozole (PROTONIX) infusion 8 mg/hr (11/17/20 0800)    Scheduled Medications: . sodium chloride   Intravenous Once  . amiodarone  200 mg Per Tube Daily  . atorvastatin  80 mg Per Tube q1800  . chlorhexidine  15 mL Mouth Rinse BID  . Chlorhexidine Gluconate Cloth  6 each Topical Q0600  . Chlorhexidine Gluconate Cloth  6 each Topical Q0600  . clonazepam  1 mg Per Tube BID  .  collagenase   Topical Daily  . doxercalciferol  5 mcg Intravenous Q M,W,F  . feeding supplement (PROSource TF)  90 mL Per Tube QID  . guaiFENesin  15 mL Per Tube Q4H  . insulin aspart  0-6 Units Subcutaneous Q4H  . lidocaine  1 patch Transdermal Q24H  . mouth rinse  15 mL Mouth Rinse q12n4p  . midodrine  10 mg Per Tube TID WC  . multivitamin  1 tablet Oral QHS  . nutrition supplement (JUVEN)  1 packet Per Tube BID BM  . [START ON 11/18/2020] pantoprazole  40 mg Intravenous Q12H  . QUEtiapine  50 mg Per Tube BID  . sevelamer carbonate  1.6 g Per Tube TID WC  . sodium chloride flush  10 mL Intracatheter Q8H  . sodium chloride flush  10-40 mL Intracatheter Q12H  . sodium chloride flush  3 mL Intravenous Q12H  . sucralfate  1 g Per Tube QID  . ticagrelor  90 mg Per Tube BID    have reviewed scheduled and prn medications.  Physical Exam: General: Ill-looking male lying on bed with high flow oxygen, alert and following simple commands. Heart:RRR, s1s2 nl  Lungs: Basal rhonchi, no wheezing Abdomen:soft, Non-tender, non-distended Extremities:No edema Dialysis Access: Has catheter,  AV fistula has no bruit or thrill.  Sabena Winner Prasad Angelize Ryce 11/17/2020,9:17 AM  LOS: 20 days  Pager: [phone]

## 2020-11-17 NOTE — Progress Notes (Signed)
Nutrition Follow-up  DOCUMENTATION CODES:   Severe malnutrition in context of chronic illness  INTERVENTION:   Transition to Nocturnal Tube Feedings via Cortrak: Vital 1.5 at 75 ml/hr x 14 hours  Pro-Stat 45 mL QID Provides 115 g of protein, 1735 kcals and 798 mL of free water  Magic cup BID with meals, each supplement provides 290 kcal and 9 grams of protein  Ensure Enlive po BID, each supplement provides 350 kcal and 20 grams of protein  Continue Juven BID per tube, each packet provides 80 calories, 8 grams of carbohydrate, 2.5  grams of protein (collagen), 7 grams of L-arginine and 7 grams of L-glutamine; supplement contains CaHMB, Vitamins C, E, B12 and Zinc to promote wound healing  Renal MVI daily  Feeding assistance and encouragement at meal times pr   NUTRITION DIAGNOSIS:   Severe Malnutrition related to chronic illness as evidenced by severe muscle depletion,severe fat depletion.  Being addressed via diet advancement, supplements, TF   GOAL:   Patient will meet greater than or equal to 90% of their needs  Met  MONITOR:   PO intake,Supplement acceptance,TF tolerance,Labs,Weight trends,Skin  REASON FOR ASSESSMENT:   Ventilator    ASSESSMENT:   Patient with PMH significant for HTN and ESRD in HD. Presents this admission with STEMI.  11/20 PCI to LAD 11/23Intubated 11/24 OG tube in esophagus, unable to advance 11/26 Cortrak attempted but unable to advance past 55 cm, likely at GE junction. DR advanced to duodenal bulb; TF initiated 11/30 Brown malodorous output from mouth and OG, TF held, CT negative for obstruction, ileus with no BM x 10 days. Abd xray with OG tube GE junction and Cortrak now retraced back into stomach; both needing advancement 12/01 iHD not tolerated, CRRT to resume, +large amount of liquid stool post enema, Attempted Cortrak advancement butunsuccessful 12/2 pt with large volume blood from OG tube; s/p EGD with noted severe esophagitis  and erosions/ulcerations in duodenal bulb compatible with ischemia (probable ischemic duodenitis)  12/3 extubated; attempted cortrak placement unsuccessful 12/9 Chest tube inserted, SLP advanced diet to Dys 3/Nectar thick  Receiving iHD today, plan for fistulagram today  Pt with pain at chest tube site and sacral wound  Tolerating Vital 1.5 at 55 ml/hr with Pro-Source 90 mL QID and juven BID via Cortrak  Tolerating po diet; pt is motivated to eat as he knows he needs "good nutrition in order to get better" No recorded po intake but per RN, pt ate about 60% of lunch and dinner yesterday. NPO this AM for procedure  Labs: phosphorus 6.9 (H), corrected calcium 11.4 (H), albumin 1.3 Meds: hectoral, lactobacillus, ss novolog, rena-vite, renvela with meals  Diet Order:   Diet Order            Diet NPO time specified Except for: Sips with Meds  Diet effective midnight           DIET - DYS 1 Room service appropriate? No; Fluid consistency: Nectar Thick  Diet effective now                 EDUCATION NEEDS:   No education needs have been identified at this time  Skin:  Skin Assessment: (P) Skin Integrity Issues: (skin tear vs pressure injury to sacrum noted today, not yet documented by RN) Skin Integrity Issues:: Stage IV Stage II: n/a Stage IV: buttocks  Last BM:  12/20 black stool via rectal tube  Height:   Ht Readings from Last 1 Encounters:  10/28/20 _0  (  1.803 m)    Weight:   Wt Readings from Last 1 Encounters:  11/17/20 82.4 kg    BMI:  Body mass index is 25.34 kg/m.  Estimated Nutritional Needs:   Kcal:  2250-2500  Protein:  135-160 g  Fluid:  1000 mL plus UOP    Kerman Passey MS, RDN, LDN, CNSC Registered Dietitian III Clinical Nutrition RD Pager and On-Call Pager Number Located in [place]

## 2020-11-17 NOTE — Progress Notes (Signed)
Physical Therapy Wound Treatment Patient Details  Name: Travis [name] MRN: [id number] Date of Birth: 1961-03-11  Today's Date: 11/17/2020 Time: 1206-1235 Time Calculation (min): 29 min  Subjective  Subjective: Agreeable to hydrotherapy. At end of the session pt asked if he was in dialysis.   Patient and Family Stated Goals: None stated Prior Treatments: Dressing changes  Pain Score: Asleep throughout most of the session and does not endorse pain.   Wound Assessment     Pressure Injury 11/14/20 Buttocks Mid;Right;Left Unstageable - Full thickness tissue loss in which the base of the injury is covered by slough (yellow, tan, gray, green or brown) and/or eschar (tan, brown or black) in the wound bed. (Active)  Dressing Type ABD;Barrier Film (skin prep);Gauze (Comment);Moist to dry 11/17/20 1354  Dressing Changed;Clean;Dry;Intact 11/17/20 1354  Dressing Change Frequency Daily 11/17/20 1354  State of Healing Early/partial granulation 11/17/20 1354  Site / Wound Assessment Pink;Yellow;Brown 11/17/20 1354  % Wound base Red or Granulating 50% 11/17/20 1354  % Wound base Yellow/Fibrinous Exudate 30% 11/17/20 1354  % Wound base Black/Eschar 20% 11/17/20 1354  % Wound base Other/Granulation Tissue (Comment) 0% 11/17/20 1354  Peri-wound Assessment Intact 11/17/20 1354  Wound Length (cm) 11 cm 11/14/20 1100  Wound Width (cm) 17 cm 11/14/20 1100  Wound Depth (cm) 0.1 cm 11/14/20 1100  Wound Surface Area (cm^2) 187 cm^2 11/14/20 1100  Wound Volume (cm^3) 18.7 cm^3 11/14/20 1100  Margins Unattached edges (unapproximated) 11/17/20 1354  Drainage Amount None 11/17/20 1354  Drainage Description Serosanguineous 11/17/20 1354  Treatment Debridement (Selective);Hydrotherapy (Pulse lavage);Packing (Saline gauze) 11/17/20 1354   Santyl applied to wound bed prior to applying dressing.     Hydrotherapy Pulsed lavage therapy - wound location: Buttocks Pulsed Lavage with Suction (psi): 12  psi Pulsed Lavage with Suction - Normal Saline Used: 1000 mL Pulsed Lavage Tip: Tip with splash shield Selective Debridement Selective Debridement - Location: Buttocks Selective Debridement - Tools Used: Forceps;Scalpel;Scissors Selective Debridement - Tissue Removed: Eschar   Wound Assessment and Plan  Wound Therapy - Assess/Plan/Recommendations Wound Therapy - Clinical Statement: Progressing with debridement of necrotic tissue. This patient will benefit from continued hydrotherapy for selective removal of unviable tissue, to decrease bioburden, and promote wound bed healing. Wound Therapy - Functional Problem List: Global weakness s/p ICU stay Factors Delaying/Impairing Wound Healing: Immobility;Multiple medical problems Hydrotherapy Plan: Debridement;Dressing change;Patient/family education;Pulsatile lavage with suction Wound Therapy - Frequency: 6X / week Wound Therapy - Follow Up Recommendations: Skilled nursing facility Wound Plan: See above  Wound Therapy Goals- Improve the function of patient's integumentary system by progressing the wound(s) through the phases of wound healing (inflammation - proliferation - remodeling) by: Decrease Necrotic Tissue to: 20% Increase Granulation Tissue to: 80% Goals/treatment plan/discharge plan were made with and agreed upon by patient/family: Yes Time For Goal Achievement: 7 days Wound Therapy - Potential for Goals: Good  Goals will be updated until maximal potential achieved or discharge criteria met.  Discharge criteria: when goals achieved, discharge from hospital, MD decision/surgical intervention, no progress towards goals, refusal/missing three consecutive treatments without notification or medical reason.  GP     Thelma Comp 11/17/2020, 1:59 PM   Rolinda Roan, PT, DPT Acute Rehabilitation Services Pager: [phone] Office: [phone]

## 2020-11-17 NOTE — Progress Notes (Signed)
Physical Therapy Treatment Patient Details Name: Travis [name] MRN: [id number] DOB: 1961/06/30 Today's Date: 11/17/2020    History of Present Illness 59 y.o. male with past medical history of end-stage renal disease on hemodialysis, essential hypertension, dilated ascending aorta and hyperlipidemia who presented on November 20 with anterior ST elevation myocardial infarction.  VDRF 11/23-12/3.  Stent placed 11/20.  Afib with RVR with cardioversion 11/21. Suspected cholecystolithiasis 11/29.      PT Comments    Pt with increased pain from sacral wound and chest tube site but willing to come to EoB to work on balance. Pt is total A for coming to EoB. With sitting pt with increased moaning due to pain requiring increased forward lean to off weight sacral wound. Pt requires maximal A to steady in this position due to increased slide forward on air bed. Pt able to find a comfortable position and is able to sit for ~3 min before fatigue requiring return to bed with total A. Pt requires max A for rolling to reposition shoulder and maintain sidelying off sacrum.  Pt is able to read therapist name badge and thank for session. D/c plans remain appropriate at this time. PT will continue to follow acutely.   Follow Up Recommendations  CIR;Supervision/Assistance - 24 hour     Equipment Recommendations  Wheelchair (measurements PT);Wheelchair cushion (measurements PT)       Precautions / Restrictions Precautions Precautions: Fall    Mobility  Bed Mobility Overal bed mobility: Needs Assistance Bed Mobility: Rolling Rolling: Max assist Sidelying to sit: Total assist     Sit to sidelying: Total assist General bed mobility comments: total A  Transfers                 General transfer comment: pt to have hydrotherapy so deferred OOB today  Ambulation/Gait             General Gait Details: unable       Balance Overall balance assessment: Needs assistance Sitting-balance  support: No upper extremity supported;Feet supported;Bilateral upper extremity supported Sitting balance-Leahy Scale: Poor Sitting balance - Comments: sitting EoB, requires maximal assist for balance to keep off sacral wound Postural control: Other (comment) (sitting EoB, requires maximal assist for balance to keep off sacral wound)                                  Cognition Arousal/Alertness: Awake/alert Behavior During Therapy: Flat affect Overall Cognitive Status: Impaired/Different from baseline Area of Impairment: Memory;Following commands;Safety/judgement;Awareness;Problem solving;Orientation;Attention                   Current Attention Level: Focused;Sustained Memory: Decreased short-term memory Following Commands: Follows one step commands inconsistently;Follows one step commands with increased time Safety/Judgement: Decreased awareness of safety;Decreased awareness of deficits Awareness: Intellectual Problem Solving: Difficulty sequencing;Requires verbal cues;Requires tactile cues;Decreased initiation;Slow processing General Comments: impaired by pain from sacral wound, able to follow commands and reads PT name tag and thanked therapist by name for assist         General Comments General comments (skin integrity, edema, etc.): VSS      Pertinent Vitals/Pain Pain Assessment: Faces Faces Pain Scale: Hurts whole lot Pain Location: bottom and peri area Pain Descriptors / Indicators: Discomfort;Guarding;Restless;Moaning Pain Intervention(s): Limited activity within patient's tolerance;Monitored during session;Repositioned           PT Goals (current goals can now be found in the care plan section)  Acute Rehab PT Goals Patient Stated Goal: to go home PT Goal Formulation: With patient Time For Goal Achievement: 11/26/20 Potential to Achieve Goals: Good Progress towards PT goals: Progressing toward goals    Frequency    Min 3X/week      PT  Plan Current plan remains appropriate       AM-PAC PT "6 Clicks" Mobility   Outcome Measure  Help needed turning from your back to your side while in a flat bed without using bedrails?: Total Help needed moving from lying on your back to sitting on the side of a flat bed without using bedrails?: Total Help needed moving to and from a bed to a chair (including a wheelchair)?: Total Help needed standing up from a chair using your arms (e.g., wheelchair or bedside chair)?: Total Help needed to walk in hospital room?: Total Help needed climbing 3-5 steps with a railing? : Total 6 Click Score: 6    End of Session Equipment Utilized During Treatment: Oxygen Activity Tolerance: Patient limited by pain Patient left: in bed;with call bell/phone within reach;with family/visitor present Nurse Communication: Mobility status;Need for lift equipment PT Visit Diagnosis: Muscle weakness (generalized) (M62.81)     Time: 0939-1000 PT Time Calculation (min) (ACUTE ONLY): 21 min  Charges:  $Therapeutic Activity: 8-22 mins                     Anthone Prieur B. Migdalia Dk PT, DPT Acute Rehabilitation Services Pager [phone] Office [phone]    [place] 11/17/2020, 1:21 PM

## 2020-11-17 NOTE — Progress Notes (Signed)
Subjective: Patient undergoing dialysis. Denies abdominal pain. Continues to look ill-appearing, NG tube in place, rectal tube in place with small amount of dark brown stools.  Objective: Vital signs in last 24 hours: Temp:  [98.5 F (36.9 C)-100.2 F (37.9 C)] 98.9 F (37.2 C) (12/10 1535) Pulse Rate:  [81-103] 96 (12/10 1615) Resp:  [18-37] 28 (12/10 1615) BP: (89-155)/(51-88) 128/75 (12/10 1615) SpO2:  [93 %-100 %] 97 % (12/10 1615) Weight:  [82.4 kg] 82.4 kg (12/10 0500) Weight change: -1.5 kg Last BM Date: 11/16/20  PE: Ill-appearing, multiple IV lines, dialysis catheter in place, chest tube in place, rectal tube in place, NG tube in place GENERAL: Awake, oriented ABDOMEN: Soft, nondistended, nontender, normoactive bowel sounds EXTREMITIES: No deformity  Lab Results: Results for orders placed or performed during the hospital encounter of 10/28/20 (from the past 48 hour(s))  Glucose, capillary     Status: Abnormal   Collection Time: 11/15/20  8:09 PM  Result Value Ref Range   Glucose-Capillary 104 (H) 70 - 99 mg/dL    Comment: Glucose reference range applies only to samples taken after fasting for at least 8 hours.  Glucose, capillary     Status: None   Collection Time: 11/15/20 11:38 PM  Result Value Ref Range   Glucose-Capillary 89 70 - 99 mg/dL    Comment: Glucose reference range applies only to samples taken after fasting for at least 8 hours.  Glucose, capillary     Status: None   Collection Time: 11/16/20  3:34 AM  Result Value Ref Range   Glucose-Capillary 94 70 - 99 mg/dL    Comment: Glucose reference range applies only to samples taken after fasting for at least 8 hours.  Magnesium     Status: None   Collection Time: 11/16/20  5:38 AM  Result Value Ref Range   Magnesium 2.0 1.7 - 2.4 mg/dL    Comment: Performed at [facility], [place] [address], [place], [place] [address]  CBC     Status: Abnormal   Collection Time: 11/16/20  5:38 AM  Result Value Ref  Range   WBC 5.9 4.0 - 10.5 K/uL   RBC 2.55 (L) 4.22 - 5.81 MIL/uL   Hemoglobin 7.4 (L) 13.0 - 17.0 g/dL   HCT 23.3 (L) 39.0 - 52.0 %   MCV 91.4 80.0 - 100.0 fL   MCH 29.0 26.0 - 34.0 pg   MCHC 31.8 30.0 - 36.0 g/dL   RDW 16.5 (H) 11.5 - 15.5 %   Platelets 165 150 - 400 K/uL   nRBC 0.0 0.0 - 0.2 %    Comment: Performed at [facility], [place] [address], [place], [place] [address]  Renal function panel     Status: Abnormal   Collection Time: 11/16/20  5:38 AM  Result Value Ref Range   Sodium 133 (L) 135 - 145 mmol/L   Potassium 3.9 3.5 - 5.1 mmol/L   Chloride 97 (L) 98 - 111 mmol/L   CO2 24 22 - 32 mmol/L   Glucose, Bld 121 (H) 70 - 99 mg/dL    Comment: Glucose reference range applies only to samples taken after fasting for at least 8 hours.   BUN 79 (H) 6 - 20 mg/dL   Creatinine, Ser 6.91 (H) 0.61 - 1.24 mg/dL   Calcium 9.1 8.9 - 10.3 mg/dL   Phosphorus 5.4 (H) 2.5 - 4.6 mg/dL   Albumin 1.2 (L) 3.5 - 5.0 g/dL   GFR, Estimated 9 (L) >  60 mL/min    Comment: (NOTE) Calculated using the CKD-EPI Creatinine Equation (2021)    Anion gap 12 5 - 15    Comment: Performed at [facility], [place] [address] [place], [place] [address]  Glucose, capillary     Status: Abnormal   Collection Time: 11/16/20  7:57 AM  Result Value Ref Range   Glucose-Capillary 103 (H) 70 - 99 mg/dL    Comment: Glucose reference range applies only to samples taken after fasting for at least 8 hours.  Prepare RBC (crossmatch)     Status: None   Collection Time: 11/16/20 10:48 AM  Result Value Ref Range   Order Confirmation      ORDER PROCESSED BY BLOOD BANK Performed at [facility], [place] [address] [place], [place] [address]   Glucose, capillary     Status: None   Collection Time: 11/16/20 12:06 PM  Result Value Ref Range   Glucose-Capillary 91 70 - 99 mg/dL    Comment: Glucose reference range applies only to samples taken after fasting for at least 8 hours.  POCT I-Stat EG7     Status:  Abnormal   Collection Time: 11/16/20  3:10 PM  Result Value Ref Range   pH, Ven 7.439 (H) 7.250 - 7.430   pCO2, Ven 37.9 (L) 44.0 - 60.0 mmHg   pO2, Ven 34.0 32.0 - 45.0 mmHg   Bicarbonate 25.5 20.0 - 28.0 mmol/L   TCO2 27 22 - 32 mmol/L   O2 Saturation 65.0 %   Acid-Base Excess 1.0 0.0 - 2.0 mmol/L   Sodium 133 (L) 135 - 145 mmol/L   Potassium 4.3 3.5 - 5.1 mmol/L   Calcium, Ion 1.35 1.15 - 1.40 mmol/L   HCT 25.0 (L) 39.0 - 52.0 %   Hemoglobin 8.5 (L) 13.0 - 17.0 g/dL   Patient temperature 37.8 C    Sample type VENOUS   Glucose, capillary     Status: None   Collection Time: 11/16/20  3:58 PM  Result Value Ref Range   Glucose-Capillary 89 70 - 99 mg/dL    Comment: Glucose reference range applies only to samples taken after fasting for at least 8 hours.  Lactate dehydrogenase (pleural or peritoneal fluid)     Status: Abnormal   Collection Time: 11/16/20  4:28 PM  Result Value Ref Range   LD, Fluid 393 (H) 3 - 23 U/L    Comment: (NOTE) Results should be evaluated in conjunction with serum values    Fluid Type-FLDH Pleural R     Comment: Performed at [facility], [place] [address], [place], [place] [address]  Protein, pleural or peritoneal fluid     Status: None   Collection Time: 11/16/20  4:28 PM  Result Value Ref Range   Total protein, fluid 3.0 g/dL    Comment: (NOTE) No normal range established for this test Results should be evaluated in conjunction with serum values    Fluid Type-FTP Pleural R     Comment: Performed at [facility] [address] [place], [place] [address]  Lactate dehydrogenase     Status: None   Collection Time: 11/16/20  4:28 PM  Result Value Ref Range   LDH 179 98 - 192 U/L    Comment: Performed at [facility], [place] [address] [place], [place] [address]  Body fluid culture     Status: None (Preliminary result)   Collection Time: 11/16/20  4:28 PM   Specimen:  Pleura; Body Fluid  Result Value Ref Range   Specimen Description  PLEURAL    Special Requests Pleural R    Gram Stain      RARE WBC PRESENT, PREDOMINANTLY MONONUCLEAR NO ORGANISMS SEEN    Culture      NO GROWTH < 24 HOURS Performed at [facility] [address], [place], [place] [address]    Report Status PENDING   Body fluid cell count with differential     Status: Abnormal   Collection Time: 11/16/20  4:28 PM  Result Value Ref Range   Fluid Type-FCT PLEURAL     Comment: FLUID   Color, Fluid RED (A) YELLOW   Appearance, Fluid TURBID (A) CLEAR   Total Nucleated Cell Count, Fluid 356 0 - 1,000 cu mm   Neutrophil Count, Fluid 44 (H) 0 - 25 %   Lymphs, Fluid 40 %   Monocyte-Macrophage-Serous Fluid 15 (L) 50 - 90 %   Eos, Fluid 0 %   Other Cells, Fluid 1 BASOPHIL %    Comment: MESOTHELIAL CELLS PRESENT Performed at [facility], [place] [address], [place], [place] [address]   Protein, total     Status: Abnormal   Collection Time: 11/16/20  4:28 PM  Result Value Ref Range   Total Protein 5.4 (L) 6.5 - 8.1 g/dL    Comment: Performed at [facility] [address] [place], [place] [address]  Hematocrit     Status: Abnormal   Collection Time: 11/16/20  4:28 PM  Result Value Ref Range   HCT 27.2 (L) 39.0 - 52.0 %    Comment: Performed at [facility], [place] [address], [place], [place] [address]  Glucose, capillary     Status: Abnormal   Collection Time: 11/16/20  8:18 PM  Result Value Ref Range   Glucose-Capillary 113 (H) 70 - 99 mg/dL    Comment: Glucose reference range applies only to samples taken after fasting for at least 8 hours.  Blood gas, venous     Status: Abnormal   Collection Time: 11/16/20 10:00 PM  Result Value Ref Range   FIO2 52.00    pH, Ven 7.368 7.250 - 7.430   pCO2, Ven 43.3 (L) 44.0 - 60.0 mmHg   pO2, Ven 53.3 (H) 32.0 - 45.0 mmHg   Bicarbonate 24.2 20.0 - 28.0 mmol/L   Acid-base deficit 0.3 0.0 - 2.0 mmol/L   O2 Saturation 84.8 %   Patient temperature 37.3    Collection site CENTRAL LINE     Drawn by [phone]    Sample type VENOUS     Comment: Performed at [facility] [address] [place], [place] [address]  Glucose, capillary     Status: None   Collection Time: 11/16/20 11:51 PM  Result Value Ref Range   Glucose-Capillary 89 70 - 99 mg/dL    Comment: Glucose reference range applies only to samples taken after fasting for at least 8 hours.  Glucose, capillary     Status: None   Collection Time: 11/17/20  4:00 AM  Result Value Ref Range   Glucose-Capillary 95 70 - 99 mg/dL    Comment: Glucose reference range applies only to samples taken after fasting for at least 8 hours.  Renal function panel (daily at 0500)     Status: Abnormal   Collection Time: 11/17/20  5:56 AM  Result Value Ref Range   Sodium 135 135 - 145 mmol/L   Potassium  4.7 3.5 - 5.1 mmol/L   Chloride 97 (L) 98 - 111 mmol/L   CO2 22 22 - 32 mmol/L   Glucose, Bld 91 70 - 99 mg/dL    Comment: Glucose reference range applies only to samples taken after fasting for at least 8 hours.   BUN 108 (H) 6 - 20 mg/dL   Creatinine, Ser 8.63 (H) 0.61 - 1.24 mg/dL   Calcium 9.2 8.9 - 10.3 mg/dL   Phosphorus 6.9 (H) 2.5 - 4.6 mg/dL   Albumin 1.3 (L) 3.5 - 5.0 g/dL   GFR, Estimated 7 (L) >60 mL/min    Comment: (NOTE) Calculated using the CKD-EPI Creatinine Equation (2021)    Anion gap 16 (H) 5 - 15    Comment: Performed at [facility] [address] [place], [place] [address]  CBC with Differential/Platelet     Status: Abnormal   Collection Time: 11/17/20  5:56 AM  Result Value Ref Range   WBC 6.9 4.0 - 10.5 K/uL   RBC 3.00 (L) 4.22 - 5.81 MIL/uL   Hemoglobin 8.8 (L) 13.0 - 17.0 g/dL   HCT 27.5 (L) 39.0 - 52.0 %   MCV 91.7 80.0 - 100.0 fL   MCH 29.3 26.0 - 34.0 pg   MCHC 32.0 30.0 - 36.0 g/dL   RDW 16.1 (H) 11.5 - 15.5 %   Platelets 175 150 - 400 K/uL   nRBC 0.0 0.0 - 0.2 %   Neutrophils Relative % 76 %   Neutro Abs 5.3 1.7 - 7.7 K/uL   Lymphocytes Relative 8 %   Lymphs Abs 0.6 (L) 0.7 - 4.0  K/uL   Monocytes Relative 10 %   Monocytes Absolute 0.7 0.1 - 1.0 K/uL   Eosinophils Relative 5 %   Eosinophils Absolute 0.4 0.0 - 0.5 K/uL   Basophils Relative 1 %   Basophils Absolute 0.1 0.0 - 0.1 K/uL   Immature Granulocytes 0 %   Abs Immature Granulocytes 0.03 0.00 - 0.07 K/uL    Comment: Performed at [facility], [place] [address], [place], [place] [address]  Magnesium     Status: None   Collection Time: 11/17/20  5:56 AM  Result Value Ref Range   Magnesium 2.0 1.7 - 2.4 mg/dL    Comment: Performed at [facility] [address], [place], [place] [address]  Glucose, capillary     Status: None   Collection Time: 11/17/20  6:04 AM  Result Value Ref Range   Glucose-Capillary 79 70 - 99 mg/dL    Comment: Glucose reference range applies only to samples taken after fasting for at least 8 hours.  Glucose, capillary     Status: None   Collection Time: 11/17/20 11:28 AM  Result Value Ref Range   Glucose-Capillary 75 70 - 99 mg/dL    Comment: Glucose reference range applies only to samples taken after fasting for at least 8 hours.  Glucose, capillary     Status: None   Collection Time: 11/17/20  3:38 PM  Result Value Ref Range   Glucose-Capillary 91 70 - 99 mg/dL    Comment: Glucose reference range applies only to samples taken after fasting for at least 8 hours.    Studies/Results: CT ABDOMEN PELVIS WO CONTRAST  Result Date: 11/15/2020 CLINICAL DATA:  59 year old male with bacteremia last month, end stage renal disease on dialysis. Severe esophagitis and duodenal ulcers on recent EGD. Continued drop in hemoglobin with liquid black stool suspicious for ongoing upper  GI blood loss. EXAM: CT ABDOMEN AND PELVIS WITHOUT CONTRAST TECHNIQUE: Multidetector CT imaging of the abdomen and pelvis was performed following the standard protocol without IV contrast. COMPARISON:  CT Abdomen and Pelvis 11/07/2020. FINDINGS: Lower chest: Increased layering pleural effusions at the lung bases,  small. Continued bilateral lower lobe consolidation, although more suggestive of atelectasis than pneumonia on previous contrast enhanced CT. Overall stable lung base ventilation. Stable mild cardiomegaly. No pericardial effusion. Hepatobiliary: Contracted gallbladder now with mild pericholecystic fluid and/or gallbladder wall thickening (series 3, image 32). No other regional inflammation. Negative noncontrast liver. Pancreas: Negative noncontrast pancreas. Spleen: Negative. Adrenals/Urinary Tract: No retroperitoneal hematoma. Stable native left renal atrophy. Absent right kidney. Thickening of the adrenal glands is stable compatible with adrenal hyperplasia. Completely decompressed urinary bladder as before. Stomach/Bowel: Rectal tube in place. Intermittent diverticulosis of the large bowel, moderate overall with most segments affected. Some residual oral contrast within the large bowel and especially diverticula. No large bowel inflammation identified. Normal appendix (series 3, image 52). No dilated small bowel.  No free air or free fluid. Enteric tube terminates at the gastric antrum. Unremarkable visible distal esophagus, stomach and duodenum. Vascular/Lymphatic: Widespread calcified atherosclerosis, including involvement of the aorta. Normal caliber abdominal aorta. Vascular patency is not evaluated in the absence of IV contrast. No lymphadenopathy. Reproductive: Negative. Other: Generalized increased subcutaneous body wall edema since November (series 3, image 49 today). Similar increased but mild presacral stranding. No pelvic free fluid. Musculoskeletal: Lower thoracic spina bifida occulta (normal variant). 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 No acute osseous abnormality identified. IMPRESSION: 1. Negative for retroperitoneal bleeding or other hematoma collection in the abdomen or pelvis. 2. A degree of anasarca has developed since 11/07/2020, including new body wall edema and small layering pleural effusions.  Contracted gallbladder with new mild pericholecystic fluid and/or wall edema also suspected due to anasarca. 3. Enteric tube terminates in the distal stomach. Rectal tube in place. No bowel inflammation is evident. Widespread large bowel diverticulosis. 4. Continued bilateral lower lobe lung consolidation, although more suggestive of atelectasis than pneumonia on prior CT. 5. Aortic Atherosclerosis (ICD10-I70.0). Electronically Signed   By: Genevie Ann M.D.   On: 11/15/2020 23:45   DG Chest Port 1 View  Result Date: 11/17/2020 CLINICAL DATA:  Chest tube.  Abnormal respirations. EXAM: PORTABLE CHEST 1 VIEW COMPARISON:  11/16/2020. FINDINGS: Right chest tube in stable position. Interim near complete resolution of right pneumothorax with miniscule right apical residual. Feeding tube, right IJ line, and left subclavian line stable position. Stable severe cardiomegaly. Pulmonary venous congestion progressive bibasilar atelectasis and infiltrates/edema. Small right pleural effusion. Stable left apical pleural thickening. Left costophrenic angle incompletely imaged. IMPRESSION: 1. Right chest tube in stable position. Interim near complete resolution of right pneumothorax with miniscule right apical residual. 2. Feeding tube, right IJ line, and left subclavian line stable position. 3. Progressive bibasilar atelectasis and infiltrates/edema. Small right pleural effusion. 4. Stable severe cardiomegaly. Pulmonary venous congestion. A component of CHF may be present. Electronically Signed   By: Marcello Moores  Register   On: 11/17/2020 07:49   DG CHEST PORT 1 VIEW  Result Date: 11/16/2020 CLINICAL DATA:  End-stage renal disease.  Chest tube placement. EXAM: PORTABLE CHEST 1 VIEW COMPARISON:  Multiple exams, including 11/08/2020 and 11/14/2020 FINDINGS: Substantial reduction in the right pleural density favoring successful drainage of pleural effusion, status post right pleural pigtail catheter placement. 5% right pneumothorax.  Right internal jugular dialysis catheter tip: Right atrium. Left central line tip: Brachiocephalic confluence. Feeding tube catheter  enters the stomach. Atherosclerotic calcification of the aortic arch. Moderate enlargement of the cardiopericardial silhouette Mild atelectasis in both lower lobes. Suspected small left pleural effusion. IMPRESSION: 1. Substantial reduction in the right pleural density favoring successful drainage of pleural effusion, status post right pleural pigtail catheter placement. 5% right pneumothorax. 2. Moderately enlarged cardiopericardial silhouette. 3. Small left pleural effusion with mild bibasilar atelectasis. Electronically Signed   By: Van Clines M.D.   On: 11/16/2020 16:36    Medications: I have reviewed the patient's current medications.  Assessment: Upper GI bleed with melena, esophagitis and ischemic appearing duodenal ulcers on endoscopy Hemoglobin 8.8 today 2 units PRBC transfused on 11/15/2020 and 2 units PRBC transfused on 11/16/20  Continued on NG tube feeding, although allowed to have dysphagia level 1 diet Continued on IV Protonix, to be switched to 40 mg twice daily from tomorrow Continued on sucralfate 1 g / 10 mL 4 times a day  CAD, anterior STEMI, on Brilinta End-stage renal disease on hemodialysis Off IV pressors Atrial fibrillation, on oral amiodarone MSSA bacteremia New loculated right-sided effusion, status post chest tube placement  Plan: Continue conservative management. No plans for repeat endoscopy currently Monitor H&H and transfuse as needed. Continued on Brilinta because of recent cardiac stent. Continue PPI and sucralfate.   Ronnette Juniper, MD 11/17/2020, 4:26 PM

## 2020-11-17 NOTE — Progress Notes (Signed)
   11/17/20 1850  Vitals  Temp 99.1 F (37.3 C)  Temp Source Oral  BP 113/66  MAP (mmHg) 78  BP Location Left Leg  BP Method Automatic  Patient Position (if appropriate) Lying  Pulse Rate 100  Pulse Rate Source Monitor  ECG Heart Rate 98  Resp 19  Oxygen Therapy  SpO2 96 %  Dialysis Weight  Weight 77.9 kg  Type of Weight Post-Dialysis  Post-Hemodialysis Assessment  Rinseback Volume (mL) 250 mL  KECN 265 V  Dialyzer Clearance Lightly streaked  Duration of HD Treatment -hour(s) 3 hour(s)  Hemodialysis Intake (mL) 500 mL  UF Total -Machine (mL) 3500 mL  Net UF (mL) 3000 mL  Tolerated HD Treatment Yes  Post-Hemodialysis Comments tx complete-pt stable  Fistula / Graft Left Upper arm Arteriovenous fistula  Placement Date/Time: 07/26/14 0941   Placed prior to admission: No  Orientation: Left  Access Location: (c) Upper arm  Access Type: Arteriovenous fistula  Site Condition No complications  Fistula / Graft Assessment Present;Thrill;Bruit  Status Deaccessed  Hemodialysis Catheter Right Internal jugular Double lumen Temporary (Non-Tunneled)  Placement Date/Time: 11/01/20 1140   Placed prior to admission: No  Time Out: Correct patient;Correct site;Correct procedure  Maximum sterile barrier precautions: Hand hygiene;Cap;Mask;Sterile gown;Sterile gloves  Site Prep: Chlorhexidine (preferred) ...  Site Condition No complications  Blue Lumen Status Flushed;Capped (Central line);Heparin locked  Red Lumen Status Capped (Central line);Flushed;Heparin locked  Catheter fill solution Heparin 1000 units/ml  Catheter fill volume (Arterial) 1.4 cc  Catheter fill volume (Venous) 1.7  Dressing Type Occlusive  Dressing Status Clean;Dry;Intact  Antimicrobial disc in place? Yes  Interventions New dressing;Dressing changed;Antimicrobial disc changed  Drainage Description None  Dressing Change Due 11/24/20  Post treatment catheter status Capped and Clamped  Tolerated HD without any issues. HD cath  used for dialysis, pt awaiting fistulogram.

## 2020-11-17 NOTE — Progress Notes (Signed)
SLP Cancellation Note  Patient Details Name: Travis [name] MRN: [id number] DOB: [birth year]   Cancelled treatment:       Reason Eval/Treat Not Completed: Patient at procedure or test/unavailable. Pt NPO for a procedure today. RN reports he tolerated purees and nectar thick well yesterday. Will f/u for upgrade when possible   Lucynda Rosano, Katherene Ponto 11/17/2020, 9:44 AM

## 2020-11-17 NOTE — Progress Notes (Signed)
NAME:  IRIE FIORELLO, MRN:  [id number], DOB:  [birth year], LOS: 62 ADMISSION DATE:  10/28/2020, CONSULTATION DATE:  10/29/20 REFERRING MD:  Cardiology - [name], CHIEF COMPLAINT:  Hypotension, gram positive cocci on culture, AMS  Brief History   Mr. Dogan is a 59 year old man with a history of HTN, ESRD on HD MWF, RUE AV fistula, hx of smoking, admitted 10/28/20 with fevers, myalgias for several days, acute chest pain.    History of present illness   On Admission, found to have STEMI, multivessel disease. Pulm edema with mild hypoxemia.  S/Post PCI to the LAD on 10/29/19. Planning for staged intervention to the left circumflex and possibly right coronary artery.  EF 50-93%, diastolic dysfunction, LVEDP 35. Underwent HD on 11/20 with 4 L volume removed.  Saturation improved after dialysis (100% on RA) Fevers noted 11/20. Blood cultures grew MSSA, noted this evening. .    This evening developed Afib with RVR 180s. Adenosine given, without improvement  Amiodarone infusion started per cardiology, 150mg  bolus, then drip.  Lopressor 5mg IV and 500ccNS given. Started on Phenylephrine, was on 246mcg on my arrival.   Cardioverted emergently at 120J for ongoing afib with hypotension (60/40) and AMS.  Converted to sinus.  Remained moderately hypotensive (MAP 60) on phenylephrine.   Patient was given one dose zosyn, switched to ancef once cultures grew back MSSA.   Cardiac stress tests annually, last done 11/16/19.    Past Medical History  HTN ESRD, HD MWF  Significant Hospital Events   Cardioversion 11/21 Cardiac cath stent to LAD 11/20  Consults:  PCCM  Nephrology ID  Procedures:  Central line 11/21 >> 11/29 Arterial line 11/21 Repeat DCCV 11/23 11/23 ETT HD line 11/24 Arterial line 11/24 L [place] CVC 11/30>>   Significant Diagnostic Tests:  11/24 TEE>>Left ventricular ejection fraction, by estimation, is 35 to 40%. The  left ventricle has moderately decreased function.Moderate to  severe mitral regurgitation. 11/23 CT Chest>>Extensive multifocal nodular and patchy airspace disease in both lungs with a slight peripheral predominance in the upper lungs. 3.1 cm nodular consolidative opacity in the right upper lobe is cavitated. Imaging features likely related to multifocal pneumonia. Septic emboli and metastatic disease considered less likely but not excluded. 11/22 LUE Vas Upper Extremity Doppler>> Arteriovenous fistula-Aneurysmal dilatation noted. 11/24 CT abdomen - no retroperitoneal hematoma 11/29 MR Brain>> No evidence acute intracranial abnormality, sinisitis, mastoid effusions 11/29>> MR Cervical Spine>> Given the provided history of bacteremia, facet joint septic arthritis is difficult to definitively exclude, but the lack of any surrounding marrow edema argues against this Cervical spondylosis  11/29 MR Thoracic Spine No significant marrow edema in thoracic spine , Mild thoracic spondylosis hypointense marrow signal throughout the thoracic spine, likely related to the patient's end-stage renal disease. multifocal airspace disease and cavitary pulmonary lesions. Small right pleural effusion. 11/29 MR Lumbar Spine:As noted above and Suspected cholecystolithiasis  Micro Data:  11/21 BCx2>>Staph aureus > MSSA by BCID 11/24 BCx2>> 11/30 BC x 2>> staph epi>> 11/09/2020 blood cultures x2 no growth Antimicrobials:  Zosyn 11/21 x 1 Ancef 11/21 ->  Interim history/subjective:  Complaining principally of chest pain at chest tube insertion site.  For declotting procedure of AV fistula today  Objective   Blood pressure 123/67, pulse 92, temperature 98.5 F (36.9 C), temperature source Axillary, resp. rate (!) 33, height 5\' 11"  (1.803 m), weight 82.4 kg, SpO2 97 %. CVP:  [7 mmHg] 7 mmHg      Intake/Output Summary (Last 24 hours) at  11/17/2020 1044 Last data filed at 11/17/2020 0800 Gross per 24 hour  Intake 2166.34 ml  Output 1630 ml  Net 536.34 ml   Filed  Weights   11/15/20 1230 11/16/20 0500 11/17/20 0500  Weight: 83.9 kg 80.7 kg 82.4 kg   General: Ill-appearing male who is awake alert follows commands in visible pain. HEENT: No JVD is appreciated at this time. Neuro: Awake following commands appropriately interactive.  Generalized weakness. CV: Normal heart sounds.  Well-perfused PULM: Decreased chest excursion.  Pain with palpation over chest tube site.  Minimal chest tube drainage. GI: Positive bowel sounds. Rectal tube is in place Extremities: warm/dry,  edema  Skin: Large sacral decubitus is noted  Resolved Hospital Problem list     Assessment & Plan:    - Anterior STEMI s/p DES 11/20 trying to get by with brillinta alone - Ischemic cardiomyopathy 40-45% - Afib previously on [facility] held for ongoing bleeding - Ongoing slow GIB with recent EGD showing severe esophagitis and duodenal ulceration question ischemic change - MSSA bacteremia thought related to HD catheter on prolonged abx therapy - New loculated R effusion - Unstagable sacral ulcer pressure injury still awaiting PT eval for hydrotherapy - ESRD on HD - Dysphagia -Generalized deconditioning  Plan: -Continue to ambulate. -Increase Tylenol to around-the-clock, continue patch -Wean enteral sedation -Take chest tube off suction to improve pain -Encourage oral and -Ambulation -Dialysis as per nephrology -Complete 6 weeks of antibiotics   Best practice:  Diet: PO trials with SLP, TF in interim Pain/Anxiety/Delirium protocol (if indicated): see above VAP protocol (if indicated): off DVT prophylaxis: SCDs + antiplatelet agent GI prophylaxis: protonix Glucose control: SSI for now Mobility: Bedrest Code Status: Full  Family Communication: wife at bedside daily updated Disposition: ICU pending liberation from pressors and precedex  Kipp Brood, MD [facility] ICU Physician [place]  Pager: [phone] Or Epic Secure Chat After hours:  [phone].  11/17/2020, 10:47 AM

## 2020-11-18 ENCOUNTER — Inpatient Hospital Stay (HOSPITAL_COMMUNITY): Payer: Medicare Other

## 2020-11-18 LAB — GLUCOSE, CAPILLARY
Glucose-Capillary: 101 mg/dL — ABNORMAL HIGH (ref 70–99)
Glucose-Capillary: 131 mg/dL — ABNORMAL HIGH (ref 70–99)
Glucose-Capillary: 107 mg/dL — ABNORMAL HIGH (ref 70–99)
Glucose-Capillary: 82 mg/dL (ref 70–99)
Glucose-Capillary: 82 mg/dL (ref 70–99)
Glucose-Capillary: 68 mg/dL — ABNORMAL LOW (ref 70–99)

## 2020-11-18 LAB — CBC
HCT: 25.7 % — ABNORMAL LOW (ref 39.0–52.0)
Hemoglobin: 8.6 g/dL — ABNORMAL LOW (ref 13.0–17.0)
MCH: 29.8 pg (ref 26.0–34.0)
MCHC: 33.5 g/dL (ref 30.0–36.0)
MCV: 88.9 fL (ref 80.0–100.0)
Platelets: 224 10*3/uL (ref 150–400)
RBC: 2.89 MIL/uL — ABNORMAL LOW (ref 4.22–5.81)
RDW: 15.9 % — ABNORMAL HIGH (ref 11.5–15.5)
WBC: 8.6 10*3/uL (ref 4.0–10.5)
nRBC: 0 % (ref 0.0–0.2)

## 2020-11-18 LAB — RENAL FUNCTION PANEL
Albumin: 1.4 g/dL — ABNORMAL LOW (ref 3.5–5.0)
Albumin: 1.3 g/dL — ABNORMAL LOW (ref 3.5–5.0)
Anion gap: 12 (ref 5–15)
Anion gap: 14 (ref 5–15)
BUN: 66 mg/dL — ABNORMAL HIGH (ref 6–20)
BUN: 71 mg/dL — ABNORMAL HIGH (ref 6–20)
CO2: 27 mmol/L (ref 22–32)
CO2: 26 mmol/L (ref 22–32)
Calcium: 9.2 mg/dL (ref 8.9–10.3)
Calcium: 9.3 mg/dL (ref 8.9–10.3)
Chloride: 97 mmol/L — ABNORMAL LOW (ref 98–111)
Chloride: 97 mmol/L — ABNORMAL LOW (ref 98–111)
Creatinine, Ser: 5.95 mg/dL — ABNORMAL HIGH (ref 0.61–1.24)
Creatinine, Ser: 6.42 mg/dL — ABNORMAL HIGH (ref 0.61–1.24)
GFR, Estimated: 10 mL/min — ABNORMAL LOW (ref >60)
GFR, Estimated: 9 mL/min — ABNORMAL LOW (ref >60)
Glucose, Bld: 104 mg/dL — ABNORMAL HIGH (ref 70–99)
Glucose, Bld: 115 mg/dL — ABNORMAL HIGH (ref 70–99)
Phosphorus: 4.9 mg/dL — ABNORMAL HIGH (ref 2.5–4.6)
Phosphorus: 5.6 mg/dL — ABNORMAL HIGH (ref 2.5–4.6)
Potassium: 4 mmol/L (ref 3.5–5.1)
Potassium: 4.4 mmol/L (ref 3.5–5.1)
Sodium: 136 mmol/L (ref 135–145)
Sodium: 137 mmol/L (ref 135–145)

## 2020-11-18 LAB — MAGNESIUM: Magnesium: 2 mg/dL (ref 1.7–2.4)

## 2020-11-18 MED ORDER — RENA-VITE PO TABS
1.0000 | ORAL_TABLET | Freq: Every day | ORAL | Status: DC
  Administered 2020-11-18: 23:00:00 1 via ORAL
  Filled 2020-11-18: qty 1

## 2020-11-18 MED ORDER — RENA-VITE PO TABS: 1.0000 | ORAL_TABLET | Freq: Every day | ORAL | Status: DC

## 2020-11-18 MED ORDER — CARVEDILOL 3.125 MG PO TABS
3.1250 mg | ORAL_TABLET | Freq: Two times a day (BID) | ORAL | Status: DC
  Administered 2020-11-18 – 2020-11-19 (×3): 3.125 mg
  Filled 2020-11-18 (×4): qty 1

## 2020-11-18 MED ORDER — QUETIAPINE FUMARATE 25 MG PO TABS
25.0000 mg | ORAL_TABLET | Freq: Every day | ORAL | Status: DC
  Administered 2020-11-20: 25 mg
  Filled 2020-11-18: qty 1

## 2020-11-18 MED ORDER — JUVEN PO PACK
1.0000 | PACK | Freq: Two times a day (BID) | ORAL | Status: DC
  Administered 2020-11-19 (×2): 1 via ORAL
  Filled 2020-11-18 (×3): qty 1

## 2020-11-18 MED ORDER — CLONAZEPAM 0.25 MG PO TBDP
0.5000 mg | ORAL_TABLET | Freq: Two times a day (BID) | ORAL | Status: DC | PRN
  Administered 2020-11-20: 0.5 mg
  Filled 2020-11-18: qty 2

## 2020-11-18 MED ORDER — SODIUM CHLORIDE (PF) 0.9 % IJ SOLN
10.0000 mg | Freq: Two times a day (BID) | INTRAMUSCULAR | Status: AC
  Administered 2020-11-18 – 2020-11-20 (×4): 10 mg via INTRAPLEURAL
  Filled 2020-11-18 (×6): qty 10

## 2020-11-18 MED ORDER — STERILE WATER FOR INJECTION IJ SOLN
5.0000 mg | Freq: Two times a day (BID) | INTRAMUSCULAR | Status: AC
  Administered 2020-11-18 – 2020-11-20 (×4): 5 mg via INTRAPLEURAL
  Filled 2020-11-18 (×6): qty 5

## 2020-11-18 MED ORDER — PANTOPRAZOLE SODIUM 40 MG PO PACK
40.0000 mg | PACK | Freq: Two times a day (BID) | ORAL | Status: DC
  Administered 2020-11-18 – 2020-11-24 (×11): 40 mg
  Filled 2020-11-18 (×16): qty 20

## 2020-11-18 MED ORDER — PANTOPRAZOLE SODIUM 40 MG PO PACK: 40.0000 mg | PACK | Freq: Every day | ORAL | Status: DC

## 2020-11-18 NOTE — Progress Notes (Signed)
Physical Therapy Wound Treatment Patient Details  Name: Travis [name] MRN: [id number] Date of Birth: [birth year]  Today's Date: 11/18/2020 Time: 9811-9147 Time Calculation (min): 33 min  Subjective  Subjective: Agreeable to hydrotherapy Patient and Family Stated Goals: None stated Prior Treatments: Dressing changes  Pain Score: Pain Score: 4/10 pain at buttock wound with debridement.  Received pain meds for breakthrough pain.  Wound Assessment  Pressure Injury 11/14/20 Buttocks Mid;Right;Left Unstageable - Full thickness tissue loss in which the base of the injury is covered by slough (yellow, tan, gray, green or brown) and/or eschar (tan, brown or black) in the wound bed. (Active)  Wound Image   11/14/20 1100  Dressing Type ABD;Barrier Film (skin prep);Gauze (Comment);Moist to dry 11/18/20 1140  Dressing Changed;Intact 11/18/20 1140  Dressing Change Frequency Daily 11/18/20 1140  State of Healing Eschar 11/18/20 1140  Site / Wound Assessment Bleeding;Pale;Red;Yellow 11/18/20 1140  % Wound base Red or Granulating 50% 11/18/20 1140  % Wound base Yellow/Fibrinous Exudate 35% 11/18/20 1140  % Wound base Black/Eschar 15% 11/18/20 1140  % Wound base Other/Granulation Tissue (Comment) 0% 11/18/20 1140  Peri-wound Assessment Intact 11/18/20 1140  Wound Length (cm) 11 cm 11/14/20 1100  Wound Width (cm) 17 cm 11/14/20 1100  Wound Depth (cm) 0.1 cm 11/14/20 1100  Wound Surface Area (cm^2) 187 cm^2 11/14/20 1100  Wound Volume (cm^3) 18.7 cm^3 11/14/20 1100  Margins Unattached edges (unapproximated) 11/17/20 1354  Drainage Amount Moderate 11/18/20 1140  Drainage Description Serosanguineous;Sanguineous 11/18/20 1140  Treatment Cleansed;Debridement (Selective);Hydrotherapy (Pulse lavage);Packing (Saline gauze) 11/18/20 1140   Santyl applied to wound bed prior to applying dressing.    Hydrotherapy Pulsed lavage therapy - wound location: Buttocks Pulsed Lavage with Suction (psi): 12  psi Pulsed Lavage with Suction - Normal Saline Used: 1000 mL Pulsed Lavage Tip: Tip with splash shield Selective Debridement Selective Debridement - Location: Buttocks Selective Debridement - Tools Used: Forceps;Scalpel;Scissors Selective Debridement - Tissue Removed: Eschar   Wound Assessment and Plan  Wound Therapy - Assess/Plan/Recommendations Wound Therapy - Clinical Statement: Progressing with debridement of necrotic tissue. This patient will benefit from continued hydrotherapy for selective removal of unviable tissue, to decrease bioburden, and promote wound bed healing. Wound Therapy - Functional Problem List: Global weakness s/p ICU stay Factors Delaying/Impairing Wound Healing: Immobility;Multiple medical problems Hydrotherapy Plan: Debridement;Dressing change;Patient/family education;Pulsatile lavage with suction Wound Therapy - Frequency: 6X / week Wound Therapy - Follow Up Recommendations: Skilled nursing facility Wound Plan: See above  Wound Therapy Goals- Improve the function of patient's integumentary system by progressing the wound(s) through the phases of wound healing (inflammation - proliferation - remodeling) by: Decrease Necrotic Tissue to: 20% Decrease Necrotic Tissue - Progress: Progressing toward goal Increase Granulation Tissue to: 80% Increase Granulation Tissue - Progress: Progressing toward goal Goals/treatment plan/discharge plan were made with and agreed upon by patient/family: Yes Time For Goal Achievement: 7 days Wound Therapy - Potential for Goals: Good  Goals will be updated until maximal potential achieved or discharge criteria met.  Discharge criteria: when goals achieved, discharge from hospital, MD decision/surgical intervention, no progress towards goals, refusal/missing three consecutive treatments without notification or medical reason.  GP   11/18/2020  Ginger Carne., PT Acute Rehabilitation Services [phone]  (pager) [phone]   (office)  Tessie Fass Falisa Lamora 11/18/2020, 11:43 AM

## 2020-11-18 NOTE — Progress Notes (Signed)
  Speech Language Pathology Treatment: Dysphagia  Patient Details Name: Travis [name] MRN: [id number] DOB: 04-10-1961 Today's Date: 11/18/2020 Time: 9407-6808 SLP Time Calculation (min) (ACUTE ONLY): 19 min  Assessment / Plan / Recommendation Clinical Impression  Pt was sleeping very soundly upon arrival (dtr suspects some medication effects), and when starting to arouse he had secretions pooling in his oral cavity. With Mod cues he expectorated these as well as a small amount of thicker secretions as he cleared his throat. Per dtr, this has been typical for him when he is first waking up, but she denies any difficulties with meals and says that he has been progressively participating more in self-feeding. Pt worked on his lunch tray, also trying advanced trials of thin liquids administered by SLP, with occasional throat clearing noted across trials. Per MBS, there was no aspiration with these consistencies. Recommend keeping diet at Dys 1 solids for energy conservation but advancing liquids up to thin. Will continue to follow.    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 He describes his chest tightness, 7/10, with radiation to his back (between his scapula), he has not exerted. EKG in ED is suggestive of anterolateral ST elevations. In the ED he was given thorazine for hiccups; CXR 10/31/20 indicated New patchy right mid and lower lung infiltrate; CT chest 10/31/20 yielded results including: Extensive multifocal nodular and patchy airspace disease in both lungs with a slight peripheral predominance in the upper lungs. 3.1 cm nodular consolidative opacity in the right upper lobe is cavitated. Imaging features likely related to multifocal pneumonia. Pt intubated 11/23-12/3/21; gastroenterology consult 11/09/20 with UGI completed and results as  follows: Moderately severe reflux and erosive esophagitis  with no bleeding; Hematin (altered blood/coffee-ground-like material) in the gastric antrum, in the cardia, in  the gastric fundus and in the gastric body. Fluid aspiration performed. Probable ischemic duodenitis. Normal third portion of the duodenum. The examination was otherwise normal. BSE generated.      SLP Plan  Continue with current plan of care       Recommendations  Diet recommendations: Dysphagia 1 (puree);Thin liquid Liquids provided via: Cup;Straw Medication Administration: Crushed with puree Supervision: Full supervision/cueing for compensatory strategies;Staff to assist with self feeding;Trained caregiver to feed patient Compensations: Slow rate;Small sips/bites Postural Changes and/or Swallow Maneuvers: Seated upright 90 degrees                Oral Care Recommendations: Oral care QID Follow up Recommendations: 24 hour supervision/assistance;Skilled Nursing facility;Inpatient Rehab SLP Visit Diagnosis: Dysphagia, oropharyngeal phase (R13.12) Plan: Continue with current plan of care       GO                Osie Bond., M.A. [place] [facility] Pager [phone] Office [phone]  11/18/2020, 2:40 PM

## 2020-11-18 NOTE — Progress Notes (Signed)
[facility] NEPHROLOGY PROGRESS NOTE  Assessment/ Plan: Pt is a 59 y.o. yo male HTN, ESRD on HD admitted on 11/20 with fever, myalgia found to have NSTEMI, complicated by A. fib with RVR, bacteremia, GI bleed and VDRF.  OP HD:AF MWF 4h 450/800 81.5kg 2/2.25 bath RUE AVF Hep 5000+ 1000 mid-run - hect 5 ug tiw  - mircera 50 ugIVq 4 weeks, last 11/15 (due 11/29)  # ESRD - usual HD MWF.  Required CRRT on and off from 11/24-12/4.  Tolerating intermittent hemodialysis.  HD on 12/10 with 3 L UF, tolerated well.  Plan for AV fistula declot on Monday by IR. Unable to use heparin because of GI bleed/anemia.  # Acute STEMI- S/P LAD PCI11/20, has otherdisease inLCx/ RCA.Transitioned to brilinta from cangrelor. Per cardiology.  #MSSA cavitary PNA / bacteremia-intubated 11/23. Per PCCM. Respiratory culture 11/23 with MSSA. Blood cx +11/21.CT abd/ pelvis negative 11/23.F/U bcx's negative.Dialysis duplex 11/22 without abscessandUS of AVF wasnegative 11/27. Plan for 6 weeks of IV cefazolin per pharmacy ending 12/30/2020 .  #PEA arrest: occurred in setting of agitation/ sedation.Brief CPR.  # Shock - resolved, now on midodrine.  #Anemia ckd/ABLA -off of anticoagulation.  Received blood transfusion, seen by GI, no plan for endoscopy.  #Secondary hyperparathyroidism: Increased sevelamer.  Monitor phosphorus level.  #Hyponatremia now managed with dialysis.  #Afib:on amio and hep gtt, s/p DCCV.   #Severe MR:per cardiology.  Subjective: Seen and examined ICU.  Doing well, no new event, tolerated dialysis well.  Patient's daughter at bedside. Objective Vital signs in last 24 hours: Vitals:   11/18/20 0700 11/18/20 0750 11/18/20 0800 11/18/20 0900  BP: 135/72  116/74 (!) 155/88  Pulse: (!) 102  95 96  Resp: 16  (!) 24 (!) 26  Temp:  100.2 F (37.9 C)    TempSrc:  Axillary    SpO2: 97%  96% 98%  Weight:      Height:       Weight change:  -4.5 kg  Intake/Output Summary (Last 24 hours) at 11/18/2020 0957 Last data filed at 11/18/2020 0900 Gross per 24 hour  Intake 1860.68 ml  Output 3340 ml  Net -1479.32 ml       Labs: Basic Metabolic Panel: Recent Labs  Lab 11/17/20 0556 11/17/20 2324 11/18/20 0312  NA 135 136 137  K 4.7 4.0 4.4  CL 97* 97* 97*  CO2 22 27 26   GLUCOSE 91 104* 115*  BUN 108* 66* 71*  CREATININE 8.63* 5.95* 6.42*  CALCIUM 9.2 9.2 9.3  PHOS 6.9* 4.9* 5.6*   Liver Function Tests: Recent Labs  Lab 11/16/20 1628 11/17/20 0556 11/17/20 2324 11/18/20 0312  PROT 5.4*  --   --   --   ALBUMIN  --  1.3* 1.4* 1.3*   No results for input(s): LIPASE, AMYLASE in the last 168 hours. No results for input(s): AMMONIA in the last 168 hours. CBC: Recent Labs  Lab 11/15/20 0508 11/15/20 1310 11/16/20 0538 11/16/20 1510 11/17/20 0556 11/17/20 2324 11/18/20 0312  WBC 8.3  --  5.9  --  6.9 7.6 8.6  NEUTROABS  --   --   --   --  5.3  --   --   HGB 7.0*   < > 7.4*   < > 8.8* 8.8* 8.6*  HCT 21.2*   < > 23.3*   < > 27.5* 25.7* 25.7*  MCV 92.2  --  91.4  --  91.7 88.3 88.9  PLT 206  --  165  --  175 200 224   < > = values in this interval not displayed.   Cardiac Enzymes: No results for input(s): CKTOTAL, CKMB, CKMBINDEX, TROPONINI in the last 168 hours. CBG: Recent Labs  Lab 11/17/20 1538 11/17/20 1954 11/17/20 2329 11/18/20 0320 11/18/20 0753  GLUCAP 91 129* 95 101* 131*    Iron Studies: No results for input(s): IRON, TIBC, TRANSFERRIN, FERRITIN in the last 72 hours. Studies/Results: DG CHEST PORT 1 VIEW  Result Date: 11/18/2020 CLINICAL DATA:  Empyema. EXAM: PORTABLE CHEST 1 VIEW COMPARISON:  November 17, 2020. FINDINGS: Stable cardiomegaly. Feeding tube is unchanged in position. Right internal jugular catheter is unchanged. Left subclavian catheter is unchanged. Stable position of right-sided chest tube without pneumothorax. Mild bibasilar atelectasis or infiltrates are noted. Bony  thorax is unremarkable. IMPRESSION: Stable position of right-sided chest tube without pneumothorax. Mild bibasilar atelectasis or infiltrates are noted. Electronically Signed   By: Marijo Conception M.D.   On: 11/18/2020 08:34   DG Chest Port 1 View  Result Date: 11/17/2020 CLINICAL DATA:  Chest tube.  Abnormal respirations. EXAM: PORTABLE CHEST 1 VIEW COMPARISON:  11/16/2020. FINDINGS: Right chest tube in stable position. Interim near complete resolution of right pneumothorax with miniscule right apical residual. Feeding tube, right IJ line, and left subclavian line stable position. Stable severe cardiomegaly. Pulmonary venous congestion progressive bibasilar atelectasis and infiltrates/edema. Small right pleural effusion. Stable left apical pleural thickening. Left costophrenic angle incompletely imaged. IMPRESSION: 1. Right chest tube in stable position. Interim near complete resolution of right pneumothorax with miniscule right apical residual. 2. Feeding tube, right IJ line, and left subclavian line stable position. 3. Progressive bibasilar atelectasis and infiltrates/edema. Small right pleural effusion. 4. Stable severe cardiomegaly. Pulmonary venous congestion. A component of CHF may be present. Electronically Signed   By: Marcello Moores  Register   On: 11/17/2020 07:49   DG CHEST PORT 1 VIEW  Result Date: 11/16/2020 CLINICAL DATA:  End-stage renal disease.  Chest tube placement. EXAM: PORTABLE CHEST 1 VIEW COMPARISON:  Multiple exams, including 11/08/2020 and 11/14/2020 FINDINGS: Substantial reduction in the right pleural density favoring successful drainage of pleural effusion, status post right pleural pigtail catheter placement. 5% right pneumothorax. Right internal jugular dialysis catheter tip: Right atrium. Left central line tip: Brachiocephalic confluence. Feeding tube catheter enters the stomach. Atherosclerotic calcification of the aortic arch. Moderate enlargement of the cardiopericardial silhouette  Mild atelectasis in both lower lobes. Suspected small left pleural effusion. IMPRESSION: 1. Substantial reduction in the right pleural density favoring successful drainage of pleural effusion, status post right pleural pigtail catheter placement. 5% right pneumothorax. 2. Moderately enlarged cardiopericardial silhouette. 3. Small left pleural effusion with mild bibasilar atelectasis. Electronically Signed   By: Van Clines M.D.   On: 11/16/2020 16:36    Medications: Infusions: . sodium chloride Stopped (11/10/20 1944)  . sodium chloride Stopped (10/28/20 0954)  . sodium chloride Stopped (11/04/20 0016)  . sodium chloride    . sodium chloride    .  ceFAZolin (ANCEF) IV 2 g (11/17/20 1240)    Scheduled Medications: . sodium chloride   Intravenous Once  . amiodarone  200 mg Per Tube Daily  . atorvastatin  80 mg Per Tube q1800  . chlorhexidine  15 mL Mouth Rinse BID  . Chlorhexidine Gluconate Cloth  6 each Topical Q0600  . clonazepam  0.5 mg Per Tube BID  . collagenase   Topical Daily  . doxercalciferol  5 mcg Intravenous Q M,W,F  . feeding  supplement  237 mL Oral BID BM  . guaiFENesin  15 mL Per Tube Q4H  . insulin aspart  0-6 Units Subcutaneous Q4H  . lactobacillus  1 g Per Tube TID WC  . lidocaine  1 patch Transdermal Q24H  . mouth rinse  15 mL Mouth Rinse q12n4p  . midodrine  5 mg Per Tube TID WC  . multivitamin  1 tablet Oral QHS  . nutrition supplement (JUVEN)  1 packet Per Tube BID BM  . pantoprazole  40 mg Intravenous Q12H  . QUEtiapine  25 mg Per Tube BID  . sevelamer carbonate  1.6 g Per Tube TID WC  . sodium chloride flush  10 mL Intracatheter Q8H  . sodium chloride flush  10-40 mL Intracatheter Q12H  . sodium chloride flush  3 mL Intravenous Q12H  . sucralfate  1 g Per Tube QID  . ticagrelor  90 mg Per Tube BID    have reviewed scheduled and prn medications.  Physical Exam: General: Lying on bed comfortable, not in distress, alert awake. Heart:RRR, s1s2  nl Lungs: Basal rhonchi, no wheezing Abdomen:soft, Non-tender, non-distended Extremities:No edema Dialysis Access: Has catheter,  AV fistula has no bruit or thrill.  Travis [name] 11/18/2020,9:57 AM  LOS: 21 days  Pager: [phone]

## 2020-11-18 NOTE — Progress Notes (Signed)
Progress Note  Patient Name: Travis [name] Date of Encounter: 11/18/2020  [facility] Cardiologist: Glenetta Hew, MD   Subjective   No chest pain, pain in his back.  Inpatient Medications    Scheduled Meds: . sodium chloride   Intravenous Once  . amiodarone  200 mg Per Tube Daily  . atorvastatin  80 mg Per Tube q1800  . chlorhexidine  15 mL Mouth Rinse BID  . Chlorhexidine Gluconate Cloth  6 each Topical Q0600  . clonazepam  0.5 mg Per Tube BID  . collagenase   Topical Daily  . doxercalciferol  5 mcg Intravenous Q M,W,F  . feeding supplement  237 mL Oral BID BM  . guaiFENesin  15 mL Per Tube Q4H  . insulin aspart  0-6 Units Subcutaneous Q4H  . lactobacillus  1 g Per Tube TID WC  . lidocaine  1 patch Transdermal Q24H  . mouth rinse  15 mL Mouth Rinse q12n4p  . midodrine  5 mg Per Tube TID WC  . multivitamin  1 tablet Oral QHS  . nutrition supplement (JUVEN)  1 packet Per Tube BID BM  . pantoprazole  40 mg Intravenous Q12H  . QUEtiapine  25 mg Per Tube BID  . sevelamer carbonate  1.6 g Per Tube TID WC  . sodium chloride flush  10 mL Intracatheter Q8H  . sodium chloride flush  10-40 mL Intracatheter Q12H  . sodium chloride flush  3 mL Intravenous Q12H  . sucralfate  1 g Per Tube QID  . ticagrelor  90 mg Per Tube BID   Continuous Infusions: . sodium chloride Stopped (11/10/20 1944)  . sodium chloride Stopped (10/28/20 0954)  . sodium chloride Stopped (11/04/20 0016)  . sodium chloride    . sodium chloride    .  ceFAZolin (ANCEF) IV 2 g (11/17/20 1240)   PRN Meds: sodium chloride, sodium chloride, sodium chloride, acetaminophen, alteplase, haloperidol lactate, heparin, heparin, lidocaine (PF), lidocaine-prilocaine, LORazepam, ondansetron (ZOFRAN) IV, oxyCODONE, pentafluoroprop-tetrafluoroeth, Resource ThickenUp Clear, sodium chloride, sodium chloride flush, sodium chloride flush   Vital Signs    Vitals:   11/18/20 0600 11/18/20 0700 11/18/20 0750 11/18/20  0800  BP: 131/73 135/72  116/74  Pulse: 98 (!) 102  95  Resp: (!) 23 16  (!) 24  Temp:   100.2 F (37.9 C)   TempSrc:   Axillary   SpO2: 99% 97%  96%  Weight:      Height:        Intake/Output Summary (Last 24 hours) at 11/18/2020 0857 Last data filed at 11/18/2020 0651 Gross per 24 hour  Intake 1550.67 ml  Output 3340 ml  Net -1789.33 ml   Last 3 Weights 11/18/2020 11/17/2020 11/17/2020  Weight (lbs) 182 lb 15.7 oz 171 lb 11.8 oz 181 lb 10.5 oz  Weight (kg) 83 kg 77.9 kg 82.4 kg      Telemetry    Sinus rhythm with ventricular rates 90-100 BPM - Personally Reviewed  ECG    NSR, poor R wave progression-personally reviewed.   Physical Exam    General: Well developed, well nourished, ill appearing HEENT: OP clear Neuro: No focal deficits  Neck: No JVD Lungs:Clear bilaterally, no wheezes, rhonci, crackles, chest tube in place Cardiovascular: Regular rate and rhythm. No murmurs, gallops or rubs. Abdomen:Soft. Bowel sounds present.  Extremities: No lower extremity edema.   Labs    High Sensitivity Troponin:   Recent Labs  Lab 10/28/20 0629 10/28/20 1046 10/28/20 2040 10/29/20 2121 10/30/20  [place]* >27,000* >27,000* >27,000* >27,000*      Chemistry Recent Labs  Lab 11/16/20 1628 11/17/20 0556 11/17/20 2324 11/18/20 0312  NA  --  135 136 137  K  --  4.7 4.0 4.4  CL  --  97* 97* 97*  CO2  --  22 27 26   GLUCOSE  --  91 104* 115*  BUN  --  108* 66* 71*  CREATININE  --  8.63* 5.95* 6.42*  CALCIUM  --  9.2 9.2 9.3  PROT 5.4*  --   --   --   ALBUMIN  --  1.3* 1.4* 1.3*  GFRNONAA  --  7* 10* 9*  ANIONGAP  --  16* 12 14     Hematology Recent Labs  Lab 11/17/20 0556 11/17/20 2324 11/18/20 0312  WBC 6.9 7.6 8.6  RBC 3.00* 2.91* 2.89*  HGB 8.8* 8.8* 8.6*  HCT 27.5* 25.7* 25.7*  MCV 91.7 88.3 88.9  MCH 29.3 30.2 29.8  MCHC 32.0 34.2 33.5  RDW 16.1* 15.9* 15.9*  PLT 175 200 224    BNPNo results for input(s): BNP, PROBNP in  the last 168 hours.   DDimer No results for input(s): DDIMER in the last 168 hours.   Radiology    DG CHEST PORT 1 VIEW  Result Date: 11/18/2020 CLINICAL DATA:  Emp[place]ema. EXAM: PORTABLE CHEST 1 VIEW COMPARISON:  November 17, 2020. FINDINGS: Stable cardiomegaly. Feeding tube is unchanged in position. Right internal jugular catheter is unchanged. Left subclavian catheter is unchanged. Stable position of right-sided chest tube without pneumothorax. Mild bibasilar atelectasis or infiltrates are noted. Bony thorax is unremarkable. IMPRESSION: Stable position of right-sided chest tube without pneumothorax. Mild bibasilar atelectasis or infiltrates are noted. Electronically Signed   By: Marijo Conception M.D.   On: 11/18/2020 08:34   DG Chest Port 1 View  Result Date: 11/17/2020 CLINICAL DATA:  Chest tube.  Abnormal respirations. EXAM: PORTABLE CHEST 1 VIEW COMPARISON:  11/16/2020. FINDINGS: Right chest tube in stable position. Interim near complete resolution of right pneumothorax with miniscule right apical residual. Feeding tube, right IJ line, and left subclavian line stable position. Stable severe cardiomegaly. Pulmonary venous congestion progressive bibasilar atelectasis and infiltrates/edema. Small right pleural effusion. Stable left apical pleural thickening. Left costophrenic angle incompletely imaged. IMPRESSION: 1. Right chest tube in stable position. Interim near complete resolution of right pneumothorax with miniscule right apical residual. 2. Feeding tube, right IJ line, and left subclavian line stable position. 3. Progressive bibasilar atelectasis and infiltrates/edema. Small right pleural effusion. 4. Stable severe cardiomegaly. Pulmonary venous congestion. A component of CHF may be present. Electronically Signed   By: Marcello Moores  Register   On: 11/17/2020 07:49   DG CHEST PORT 1 VIEW  Result Date: 11/16/2020 CLINICAL DATA:  End-stage renal disease.  Chest tube placement. EXAM: PORTABLE CHEST 1  VIEW COMPARISON:  Multiple exams, including 11/08/2020 and 11/14/2020 FINDINGS: Substantial reduction in the right pleural density favoring successful drainage of pleural effusion, status post right pleural pigtail catheter placement. 5% right pneumothorax. Right internal jugular dialysis catheter tip: Right atrium. Left central line tip: Brachiocephalic confluence. Feeding tube catheter enters the stomach. Atherosclerotic calcification of the aortic arch. Moderate enlargement of the cardiopericardial silhouette Mild atelectasis in both lower lobes. Suspected small left pleural effusion. IMPRESSION: 1. Substantial reduction in the right pleural density favoring successful drainage of pleural effusion, status post right pleural pigtail catheter placement. 5% right pneumothorax. 2. Moderately enlarged cardiopericardial silhouette. 3. Small left pleural  effusion with mild bibasilar atelectasis. Electronically Signed   By: Van Clines M.D.   On: 11/16/2020 16:36    Cardiac Studies     Patient Profile     59 y.o. male with ESRD on HD, HTN, tobacco abuse, CAD  presenting with anterior STEMI 10/28/20 s/p placement of a stent in the LAD with residual disease in the RCA and Circumflex. He developed atrial fib with RVR. Hospital course complicated by MSSA bacteremia with shock, upper GI bleeding secondary to severe esophagitis and duodenal ulcer, ICU delirium, metabolic encephalopathy, acute hypoxemic respiratory failure, anemia.   Assessment & Plan    1) CAD/Anterior STEMI: He has no chest pain. Optimal to continue Brilinta if possible if his H/H remains stable. Continue statin. No ASA with GI bleeding. Plan for staged PCI of the RCA when other issues stable.    2) ESRD: Dialysis per Nephrology  3) Shock: Multi-factorial. BP stable off of pressors   4) Atrial fibrillation: Sinus in the last 2 days. Continue oral amiodarone. Not a candidate for anti-coagulation.    5) Ischemic cardiomyopathy/Acute  on chronic systolic CHF: QAST=41-96% by echo 11/01/20. Fluid removal with HD. Doesn't appear fluid overloaded. -would add a beta blocker now that his BP is stable. (Coreg 3.25 mg po BID)  6. Anemia: H/H stable post transfusion yesterday. Likely GI bleeding. GI following.   For questions or updates, please contact [place] Please consult [email or web address] for contact info under     Signed, Ena Dawley, MD  11/18/2020, 8:57 AM

## 2020-11-18 NOTE — Progress Notes (Signed)
NAME:  Travis [name], MRN:  [id number], DOB:  Aug 17, 1961, LOS: 21 ADMISSION DATE:  10/28/2020, CONSULTATION DATE:  10/29/20 REFERRING MD:  Cardiology - [name], CHIEF COMPLAINT:  Hypotension, gram positive cocci on culture, AMS  Brief History   Mr. Pask is a 59 year old man with a history of HTN, ESRD on HD MWF, RUE AV fistula, hx of smoking, admitted 10/28/20 with fevers, myalgias for several days, acute chest pain.    History of present illness   On Admission, found to have STEMI, multivessel disease. Pulm edema with mild hypoxemia.  S/Post PCI to the LAD on 10/29/19. Planning for staged intervention to the left circumflex and possibly right coronary artery.  EF 16-96%, diastolic dysfunction, LVEDP 35. Underwent HD on 11/20 with 4 L volume removed.  Saturation improved after dialysis (100% on RA) Fevers noted 11/20. Blood cultures grew MSSA, noted this evening. .    This evening developed Afib with RVR 180s. Adenosine given, without improvement  Amiodarone infusion started per cardiology, 150mg  bolus, then drip.  Lopressor 5mg IV and 500ccNS given. Started on Phenylephrine, was on 257mcg on my arrival.   Cardioverted emergently at 120J for ongoing afib with hypotension (60/40) and AMS.  Converted to sinus.  Remained moderately hypotensive (MAP 60) on phenylephrine.   Patient was given one dose zosyn, switched to ancef once cultures grew back MSSA.   Cardiac stress tests annually, last done 11/16/19.    Past Medical History  HTN ESRD, HD MWF  Significant Hospital Events   Cardioversion 11/21 Cardiac cath stent to LAD 11/20  Consults:  PCCM  Nephrology ID  Procedures:  Central line 11/21 >> 11/29 Arterial line 11/21 Repeat DCCV 11/23 11/23 ETT HD line 11/24 Arterial line 11/24 L [place] CVC 11/30>>   Significant Diagnostic Tests:  11/24 TEE>>Left ventricular ejection fraction, by estimation, is 35 to 40%. The  left ventricle has moderately decreased function.Moderate to  severe mitral regurgitation. 11/23 CT Chest>>Extensive multifocal nodular and patchy airspace disease in both lungs with a slight peripheral predominance in the upper lungs. 3.1 cm nodular consolidative opacity in the right upper lobe is cavitated. Imaging features likely related to multifocal pneumonia. Septic emboli and metastatic disease considered less likely but not excluded. 11/22 LUE Vas Upper Extremity Doppler>> Arteriovenous fistula-Aneurysmal dilatation noted. 11/24 CT abdomen - no retroperitoneal hematoma 11/29 MR Brain>> No evidence acute intracranial abnormality, sinisitis, mastoid effusions 11/29>> MR Cervical Spine>> Given the provided history of bacteremia, facet joint septic arthritis is difficult to definitively exclude, but the lack of any surrounding marrow edema argues against this Cervical spondylosis  11/29 MR Thoracic Spine No significant marrow edema in thoracic spine , Mild thoracic spondylosis hypointense marrow signal throughout the thoracic spine, likely related to the patient's end-stage renal disease. multifocal airspace disease and cavitary pulmonary lesions. Small right pleural effusion. 11/29 MR Lumbar Spine:As noted above and Suspected cholecystolithiasis 12/11 CT chest - improving air space disease - R empyema persists but adequate chest tube position.  Micro Data:  11/21 BCx2>>Staph aureus > MSSA by BCID 11/24 BCx2>> 11/30 BC x 2>> staph epi>> 11/09/2020 blood cultures x2 no growth Antimicrobials:  Zosyn 11/21 x 1 Ancef 11/21 ->  Interim history/subjective:  Pain related to chest tube improving. Tolerated IHD yesterday. Declotting procedure delayed until Monday.   Objective   Blood pressure 128/74, pulse 97, temperature 100.2 F (37.9 C), temperature source Axillary, resp. rate (!) 22, height 5\' 11"  (1.803 m), weight 83 kg, SpO2 99 %. CVP:  [2  mmHg-5 mmHg] 5 mmHg      Intake/Output Summary (Last 24 hours) at 11/18/2020 1119 Last data filed at  11/18/2020 0900 Gross per 24 hour  Intake 1640.7 ml  Output 3040 ml  Net -1399.3 ml   Filed Weights   11/17/20 0500 11/17/20 1850 11/18/20 0450  Weight: 82.4 kg 77.9 kg 83 kg   General: Ill-appearing male who is awake alert follows commands in visible pain. HEENT: No JVD is appreciated at this time. Neuro: Awake following commands appropriately interactive.  Generalized weakness. CV: Normal heart sounds.  Well-perfused PULM: Decreased chest excursion.  Pain with palpation over chest tube site.  Minimal chest tube drainage. GI: Positive bowel sounds.  Extremities: warm/dry,  edema  Skin: Large sacral decubitus is noted  Resolved Hospital Problem list     Assessment & Plan:    - Anterior STEMI s/p DES 11/20 trying to get by with brillinta alone - Ischemic cardiomyopathy 40-45% - Afib previously on [facility] held for ongoing bleeding - Recent GIB with recent EGD showing severe esophagitis and duodenal ulceration question ischemic change - MSSA bacteremia thought related to HD catheter on prolonged abx therapy -  loculated R effusion - Unstagable sacral ulcer pressure injury still awaiting PT eval for hydrotherapy - ESRD on HD - Dysphagia -Generalized deconditioning  Plan: -Continue Tylenol to around-the-clock, continue lidocaine patch - Continue chest tube drainage.  - Instill fibrinolytics  -Wean enteral sedation -Stop tube feeds, remove NGT if calorie counts are adequate. -Ambulation -Dialysis as per nephrology -Complete 6 weeks of antibiotics minimum - Continue Brillinta - Introduce beta-blocker - Can remove HD line once fistula functioning.    Best practice:  Diet: Stop tube feeds Pain/Anxiety/Delirium protocol (if indicated): see above VAP protocol (if indicated): off DVT prophylaxis: SCDs + antiplatelet agent GI prophylaxis: protonix Glucose control: SSI for now Mobility: Bedrest Code Status: Full  Family Communication: family updated at bedside today.   Disposition: to PCU, [place] notified and order reconciliation performed.   Kipp Brood, MD [facility] ICU Physician [place]  Pager: [phone] Or Epic Secure Chat After hours: [phone].  11/18/2020, 11:19 AM

## 2020-11-18 NOTE — Procedures (Signed)
Pleural Fibrinolytic Administration Procedure Note  Travis [name]  [id number]  October 07, 1961  Date:11/18/20  Time:3:42 PM   Provider Performing:Monifa Blanchette   Procedure: Pleural Fibrinolysis Initial day [phone])  Indication(s) Fibrinolysis of complicated pleural effusion  Consent Risks of the procedure as well as the alternatives and risks of each were explained to the patient and/or caregiver.  Consent for the procedure was obtained.   Anesthesia None   Time Out Verified patient identification, verified procedure, site/side was marked, verified correct patient position, special equipment/implants available, medications/allergies/relevant history reviewed, required imaging and test results available.   Sterile Technique Hand hygiene, gloves   Procedure Description Existing pleural catheter was cleaned and accessed in sterile manner.  10mg  of tPA in 30cc of saline and 5mg  of dornase in 30cc of sterile water were injected into pleural space using existing pleural catheter.  Catheter was clamped for 1 hour and then placed back to suction.   Complications/Tolerance None; patient tolerated the procedure well.  Good initial response.   EBL None   Specimen(s) None  Kipp Brood, MD [facility] ICU Physician [place]  Pager: [phone] Or Epic Secure Chat After hours: [phone].  11/18/2020, 3:43 PM

## 2020-11-18 NOTE — Progress Notes (Signed)
[facility] Gastroenterology Progress Note  JAMS TRICKETT 59 y.o. Sep 16, 1961  CC: Upper GI bleed, esophagitis, duodenal ulcer   Subjective: Patient seen and examined at bedside.  Discussed with RN at bedside.  No acute GI issues noted.     Objective: Vital signs in last 24 hours: Vitals:   11/18/20 0900 11/18/20 1000  BP: (!) 155/88 128/74  Pulse: 96 97  Resp: (!) 26 (!) 22  Temp:    SpO2: 98% 99%    Physical Exam:  General.  Chronically ill appearing patient.  Not in acute distress. Abdomen is soft, nontender, nondistended, bowel sounds present. Psych.  Mood and affect normal   Lab Results: Recent Labs    11/17/20 0556 11/17/20 2324 11/18/20 0312  NA 135 136 137  K 4.7 4.0 4.4  CL 97* 97* 97*  CO2 22 27 26   GLUCOSE 91 104* 115*  BUN 108* 66* 71*  CREATININE 8.63* 5.95* 6.42*  CALCIUM 9.2 9.2 9.3  MG 2.0  --  2.0  PHOS 6.9* 4.9* 5.6*   Recent Labs    11/16/20 1628 11/17/20 0556 11/17/20 2324 11/18/20 0312  PROT 5.4*  --   --   --   ALBUMIN  --    < > 1.4* 1.3*   < > = values in this interval not displayed.   Recent Labs    11/17/20 0556 11/17/20 2324 11/18/20 0312  WBC 6.9 7.6 8.6  NEUTROABS 5.3  --   --   HGB 8.8* 8.8* 8.6*  HCT 27.5* 25.7* 25.7*  MCV 91.7 88.3 88.9  PLT 175 200 224   No results for input(s): LABPROT, INR in the last 72 hours.    Assessment/Plan: -Upper GI bleed.  Recent EGD showed esophagitis and duodenal ulcers.  No further bleeding episodes.  Hemoglobin stable. -Coronary artery disease with anterior STEMI.  Currently on Brilinta. -Atrial fibrillation End-stage renal disease on dialysis MSSA bacteremia -Multiple other medical issues  Recommendations -------------------------- -Continue conservative management for now with PPI and Carafate. -Transfuse as needed to keep hemoglobin around 8 -GI will follow up on Monday.   Otis Brace MD, [place] 11/18/2020, 10:58 AM  Contact #  [phone]

## 2020-11-18 NOTE — Progress Notes (Addendum)
Nutrition Brief Note  Received consult for calorie count. Tube feedings via Cortrak have been stopped. Currently on a dysphagia 1 diet with thin liquids. Meal intake documented at 75% for breakfast today.   Intervention:  Calorie count x 48 hours.  RD to document results of calorie count on Monday.  Continue Magic cup TID with meals, each supplement provides 290 kcal and 9 grams of protein.  Continue Ensure Enlive po BID, each supplement provides 350 kcal and 20 grams of protein.  Continue Juven BID, change to PO, each packet provides 80 calories, 8 grams of carbohydrate, 2.5 grams of protein (collagen), 7 grams of L-arginine and 7 grams of L-glutamine; supplement contains CaHMB, Vitamins C, E, B12 and Zinc to promote wound healing.  Continue renal MVI, change to PO.  Lucas Mallow, RD, LDN, CNSC Please refer to [facility] for contact information.

## 2020-11-19 ENCOUNTER — Inpatient Hospital Stay (HOSPITAL_COMMUNITY): Payer: Medicare Other

## 2020-11-19 LAB — GLUCOSE, CAPILLARY
Glucose-Capillary: 65 mg/dL — ABNORMAL LOW (ref 70–99)
Glucose-Capillary: 69 mg/dL — ABNORMAL LOW (ref 70–99)
Glucose-Capillary: 81 mg/dL (ref 70–99)
Glucose-Capillary: 85 mg/dL (ref 70–99)
Glucose-Capillary: 88 mg/dL (ref 70–99)
Glucose-Capillary: 74 mg/dL (ref 70–99)
Glucose-Capillary: 84 mg/dL (ref 70–99)
Glucose-Capillary: 72 mg/dL (ref 70–99)

## 2020-11-19 LAB — RENAL FUNCTION PANEL
Albumin: 1.3 g/dL — ABNORMAL LOW (ref 3.5–5.0)
Anion gap: 15 (ref 5–15)
BUN: 98 mg/dL — ABNORMAL HIGH (ref 6–20)
CO2: 24 mmol/L (ref 22–32)
Calcium: 9.5 mg/dL (ref 8.9–10.3)
Chloride: 93 mmol/L — ABNORMAL LOW (ref 98–111)
Creatinine, Ser: 8.84 mg/dL — ABNORMAL HIGH (ref 0.61–1.24)
GFR, Estimated: 6 mL/min — ABNORMAL LOW (ref >60)
Glucose, Bld: 85 mg/dL (ref 70–99)
Phosphorus: 6.2 mg/dL — ABNORMAL HIGH (ref 2.5–4.6)
Potassium: 5.6 mmol/L — ABNORMAL HIGH (ref 3.5–5.1)
Sodium: 132 mmol/L — ABNORMAL LOW (ref 135–145)

## 2020-11-19 LAB — CBC
HCT: 27.9 % — ABNORMAL LOW (ref 39.0–52.0)
Hemoglobin: 9 g/dL — ABNORMAL LOW (ref 13.0–17.0)
MCH: 29 pg (ref 26.0–34.0)
MCHC: 32.3 g/dL (ref 30.0–36.0)
MCV: 90 fL (ref 80.0–100.0)
Platelets: 298 10*3/uL (ref 150–400)
RBC: 3.1 MIL/uL — ABNORMAL LOW (ref 4.22–5.81)
RDW: 15.2 % (ref 11.5–15.5)
WBC: 14 10*3/uL — ABNORMAL HIGH (ref 4.0–10.5)
nRBC: 0 % (ref 0.0–0.2)

## 2020-11-19 LAB — MAGNESIUM: Magnesium: 1.8 mg/dL (ref 1.7–2.4)

## 2020-11-19 MED ORDER — CHLORHEXIDINE GLUCONATE CLOTH 2 % EX PADS
6.0000 | MEDICATED_PAD | Freq: Every day | CUTANEOUS | Status: DC
  Administered 2020-11-19 – 2020-11-22 (×3): 6 via TOPICAL

## 2020-11-19 MED ORDER — GUAIFENESIN 100 MG/5ML PO SOLN
15.0000 mL | ORAL | Status: DC
  Administered 2020-11-20 – 2020-11-24 (×22): 300 mg
  Filled 2020-11-19 (×20): qty 15
  Filled 2020-11-19: qty 5
  Filled 2020-11-19: qty 15

## 2020-11-19 MED ORDER — RENA-VITE PO TABS
1.0000 | ORAL_TABLET | Freq: Every day | ORAL | Status: DC
  Administered 2020-11-20 – 2020-11-23 (×5): 1
  Filled 2020-11-19 (×5): qty 1

## 2020-11-19 MED ORDER — SODIUM ZIRCONIUM CYCLOSILICATE 10 G PO PACK
10.0000 g | PACK | Freq: Once | ORAL | Status: AC
  Administered 2020-11-19: 12:00:00 10 g via ORAL
  Filled 2020-11-19: qty 1

## 2020-11-19 NOTE — Progress Notes (Signed)
[facility] NE[facility]ROLOG[facility] PROGRESS NOTE  Assessment/ Plan: Pt is a 59 y.o. yo male HTN, ESRD on HD admitted on 11/20 with fever, myalgia found to have NSTEMI, complicated by A. fib with RVR, bacteremia, GI bleed and VDRF.  OP HD:AF MWF 4h 450/800 81.5kg 2/2.25 bath RUE AVF Hep 5000+ 1000 mid-run - hect 5 ug tiw  - mircera 50 ugIVq 4 weeks, last 11/15 (due 11/29)  # ESRD - usual HD MWF.  Required CRRT on and off from 11/24-12/4.  Tolerating intermittent hemodialysis.  HD on 12/10 with 3 L UF, tolerated well.  Plan for AV fistula declot on Monday by IR. Unable to use heparin because of GI bleed/anemia. Potassium 5.6, order Lokelma, HD tomorrow.  # Acute STEMI- S/P LAD PCI11/20, has otherdisease inLCx/ RCA.Transitioned to brilinta from cangrelor. Per cardiology.  #MSSA cavitary PNA / bacteremia-intubated 11/23. Per PCCM. Respiratory culture 11/23 with MSSA. Blood cx +11/21.CT abd/ pelvis negative 11/23.F/U bcx's negative.Dialysis duplex 11/22 without abscessandUS of AVF wasnegative 11/27. Plan for 6 weeks of IV cefazolin per pharmacy ending 12/30/2020 . Chest tube placed by PCCM.  #PEA arrest: occurred in setting of agitation/ sedation.Brief CPR.  # Shock - resolved, now on midodrine.  #Anemia ckd/ABLA -off of anticoagulation.  Received blood transfusion, seen by GI, no plan for endoscopy.  #Secondary hyperparathyroidism: Increased sevelamer.  Monitor phosphorus level.  #Hyponatremia now managed with dialysis.  #Afib:on amio and hep gtt, s/p DCCV.   #Severe MR:per cardiology.  Subjective: Seen and examined.  Transferred out of ICU to the floor.  No new event.  Denies nausea vomiting chest pain.  Objective Vital signs in last 24 hours: Vitals:   11/18/20 1400 11/18/20 1700 11/18/20 1800 11/19/20 0059  BP: (!) 91/51 137/67 115/63 90/67  Pulse:  93 (!) 102 87  Resp:    18  Temp:  98.5 F (36.9 C)  99.7 F (37.6 C)  TempSrc:   Axillary  Oral  SpO2: 93% 97%  93%  Weight:      Height:       Weight change:   Intake/Output Summary (Last 24 hours) at 11/19/2020 0956 Last data filed at 11/19/2020 0130 Gross per 24 hour  Intake 520 ml  Output 950 ml  Net -430 ml       Labs: Basic Metabolic Panel: Recent Labs  Lab 11/17/20 2324 11/18/20 0312 11/19/20 0658  NA 136 137 132*  K 4.0 4.4 5.6*  CL 97* 97* 93*  CO2 27 26 24   GLUCOSE 104* 115* 85  BUN 66* 71* 98*  CREATININE 5.95* 6.42* 8.84*  CALCIUM 9.2 9.3 9.5  PHOS 4.9* 5.6* 6.2*   Liver Function Tests: Recent Labs  Lab 11/16/20 1628 11/17/20 0556 11/17/20 2324 11/18/20 0312 11/19/20 0658  PROT 5.4*  --   --   --   --   ALBUMIN  --    < > 1.4* 1.3* 1.3*   < > = values in this interval not displayed.   No results for input(s): LIPASE, AMYLASE in the last 168 hours. No results for input(s): AMMONIA in the last 168 hours. CBC: Recent Labs  Lab 11/16/20 0538 11/16/20 1510 11/17/20 0556 11/17/20 2324 11/18/20 0312 11/19/20 0658  WBC 5.9  --  6.9 7.6 8.6 14.0*  NEUTROABS  --   --  5.3  --   --   --   HGB 7.4*   < > 8.8* 8.8* 8.6* 9.0*  HCT 23.3*   < > 27.5* 25.7* 25.7* 27.9*  MCV 91.4  --  91.7 88.3 88.9 90.0  PLT 165  --  175 200 224 298   < > = values in this interval not displayed.   Cardiac Enzymes: No results for input(s): CKTOTAL, CKMB, CKMBINDEX, TROPONINI in the last 168 hours. CBG: Recent Labs  Lab 11/18/20 2208 11/18/20 2230 11/18/20 2254 11/19/20 0040 11/19/20 0634  GLUCAP 65* 69* 85 88 81    Iron Studies: No results for input(s): IRON, TIBC, TRANSFERRIN, FERRITIN in the last 72 hours. Studies/Results: CT CHEST WO CONTRAST  Result Date: 11/18/2020 CLINICAL DATA:  59 year old male with history of empyema. EXAM: CT CHEST WITHOUT CONTRAST TECHNIQUE: Multidetector CT imaging of the chest was performed following the standard protocol without IV contrast. COMPARISON:  Chest CT 10/31/2020. FINDINGS: Comment: Today's  study is limited by lack of IV contrast for detection and characterization of visceral and/or vascular lesions. Cardiovascular: Heart size is mildly enlarged. There is no significant pericardial fluid, thickening or pericardial calcification. There is aortic atherosclerosis, as well as atherosclerosis of the great vessels of the mediastinum and the coronary arteries, including calcified atherosclerotic plaque in the left main, left anterior descending, left circumflex and right coronary arteries. Moderate calcifications of the aortic valve and mitral annulus. Right internal jugular central venous catheter with tip terminating in the right atrium. Mediastinum/Nodes: No pathologically enlarged mediastinal or hilar lymph nodes. Please note that accurate exclusion of hilar adenopathy is limited on noncontrast CT scans. Nasogastric tube extending into the stomach. Esophagus is otherwise unremarkable in appearance. No axillary lymphadenopathy. Lungs/Pleura: Much of the previously noted patchy areas of airspace consolidation have regressed compared to the prior study, although several of these areas are now far more nodular and mass-like in appearance. The largest residual area of consolidation is in the lateral segment of the right middle lobe (axial image 113 of series 3) where there is a 4.4 x 2.5 cm area of consolidation which is relatively low attenuation (23 HU) and appears to have some convex margins, likely to reflect an area of developing pulmonary necrosis and abscess. Several other thick-walled cavities are noted (example on axial image 49 of series 4 in the periphery of the left upper lobe), which likely represents smaller resolving abscesses. Extensive volume loss is also noted, with associated thickening of the peribronchovascular interstitium and regional areas of architectural distortion, most evident in the lower lobes of the lungs bilaterally. Small left and small to moderate right pleural effusions are  present, very complex in appearance with extensive pleural thickening and multiple loculated fluid collections. The largest of these loculated fluid collections is in the left hemithorax is located anteriorly adjacent to the left upper lobe (axial image 53 of series 3) measuring 3.7 x 2.2 cm. Small bore chest tube in the posterior aspect of the right hemithorax appears appropriately located. Upper Abdomen: Aortic atherosclerosis. Musculoskeletal: There are no aggressive appearing lytic or blastic lesions noted in the visualized portions of the skeleton. IMPRESSION: 1. Resolving multilobar bilateral pneumonia with evidence of widespread pulmonary necrosis and multiple pulmonary abscesses, as well as bilateral empyemas, as detailed above. 2. Aortic atherosclerosis, in addition to left main and 3 vessel coronary artery disease. Please note that although the presence of coronary artery calcium documents the presence of coronary artery disease, the severity of this disease and any potential stenosis cannot be assessed on this non-gated CT examination. Assessment for potential risk factor modification, dietary therapy or pharmacologic therapy may be warranted, if clinically indicated. 3. Mild cardiomegaly. Aortic Atherosclerosis (ICD10-I70.0).  Electronically Signed   By: Vinnie Langton M.D.   On: 11/18/2020 10:40   DG CHEST PORT 1 VIEW  Result Date: 11/18/2020 CLINICAL DATA:  Empyema. EXAM: PORTABLE CHEST 1 VIEW COMPARISON:  November 17, 2020. FINDINGS: Stable cardiomegaly. Feeding tube is unchanged in position. Right internal jugular catheter is unchanged. Left subclavian catheter is unchanged. Stable position of right-sided chest tube without pneumothorax. Mild bibasilar atelectasis or infiltrates are noted. Bony thorax is unremarkable. IMPRESSION: Stable position of right-sided chest tube without pneumothorax. Mild bibasilar atelectasis or infiltrates are noted. Electronically Signed   By: Marijo Conception M.D.    On: 11/18/2020 08:34    Medications: Infusions: . sodium chloride Stopped (11/10/20 1944)  . sodium chloride Stopped (10/28/20 0954)  . sodium chloride Stopped (11/04/20 0016)  . sodium chloride    . sodium chloride    .  ceFAZolin (ANCEF) IV 2 g (11/17/20 1240)    Scheduled Medications: . sodium chloride   Intravenous Once  . alteplase (TPA) for intrapleural administration  10 mg Intrapleural Q12H   And  . pulmozyme (DORNASE) for intrapleural administration  5 mg Intrapleural Q12H  . amiodarone  200 mg Per Tube Daily  . atorvastatin  80 mg Per Tube q1800  . carvedilol  3.125 mg Per Tube BID WC  . chlorhexidine  15 mL Mouth Rinse BID  . Chlorhexidine Gluconate Cloth  6 each Topical Q0600  . collagenase   Topical Daily  . doxercalciferol  5 mcg Intravenous Q M,W,F  . feeding supplement  237 mL Oral BID BM  . guaiFENesin  15 mL Per Tube Q4H  . insulin aspart  0-6 Units Subcutaneous Q4H  . lactobacillus  1 g Per Tube TID WC  . mouth rinse  15 mL Mouth Rinse q12n4p  . midodrine  5 mg Per Tube TID WC  . multivitamin  1 tablet Oral QHS  . nutrition supplement (JUVEN)  1 packet Oral BID BM  . pantoprazole sodium  40 mg Per Tube BID   Followed by  . [START ON 12/18/2020] pantoprazole sodium  40 mg Per Tube Daily  . QUEtiapine  25 mg Per Tube QHS  . sevelamer carbonate  1.6 g Per Tube TID WC  . sodium chloride flush  10 mL Intracatheter Q8H  . sodium chloride flush  10-40 mL Intracatheter Q12H  . sodium chloride flush  3 mL Intravenous Q12H  . sucralfate  1 g Per Tube QID  . ticagrelor  90 mg Per Tube BID    have reviewed scheduled and prn medications.  Physical Exam: General: Lying in bed, alert awake, has NG tube. Heart:RRR, s1s2 nl Lungs: Basal rhonchi, no wheezing Abdomen:soft, Non-tender, non-distended Extremities:No edema Dialysis Access: Has catheter,  AV fistula has no bruit or thrill.  Travis [name] 11/19/2020,9:56 AM  LOS: 22 days  Pager: [phone]

## 2020-11-19 NOTE — Progress Notes (Signed)
NAME:  LIANDRO THELIN, MRN:  [id number], DOB:  [birth year], LOS: 70 ADMISSION DATE:  10/28/2020, CONSULTATION DATE:  10/29/20 REFERRING MD:  Cardiology - [name], CHIEF COMPLAINT:  Hypotension, gram positive cocci on culture, AMS  Brief History   Mr. Bussiere is a 59 year old man with a history of HTN, ESRD on HD MWF, RUE AV fistula, hx of smoking, admitted 10/28/20 with fevers, myalgias for several days, acute chest pain.    History of present illness   On Admission, found to have STEMI, multivessel disease. Pulm edema with mild hypoxemia.  S/Post PCI to the LAD on 10/29/19. Planning for staged intervention to the left circumflex and possibly right coronary artery.  EF 53-61%, diastolic dysfunction, LVEDP 35. Underwent HD on 11/20 with 4 L volume removed.  Saturation improved after dialysis (100% on RA) Fevers noted 11/20. Blood cultures grew MSSA, noted this evening. .    This evening developed Afib with RVR 180s. Adenosine given, without improvement  Amiodarone infusion started per cardiology, 150mg  bolus, then drip.  Lopressor 5mg IV and 500ccNS given. Started on Phenylephrine, was on 246mcg on my arrival.   Cardioverted emergently at 120J for ongoing afib with hypotension (60/40) and AMS.  Converted to sinus.  Remained moderately hypotensive (MAP 60) on phenylephrine.   Patient was given one dose zosyn, switched to ancef once cultures grew back MSSA.   Cardiac stress tests annually, last done 11/16/19.    Past Medical History  HTN ESRD, HD MWF  Significant Hospital Events   Cardioversion 11/21 Cardiac cath stent to LAD 11/20  Consults:  PCCM  Nephrology ID  Procedures:  Central line 11/21 >> 11/29 Arterial line 11/21 out Repeat DCCV 11/23 11/23 ETT out HD line 11/24 Arterial line 11/24 out L  CVC 11/30>>   Significant Diagnostic Tests:  11/24 TEE>>Left ventricular ejection fraction, by estimation, is 35 to 40%. The  left ventricle has moderately decreased  function.Moderate to severe mitral regurgitation. 11/23 CT Chest>>Extensive multifocal nodular and patchy airspace disease in both lungs with a slight peripheral predominance in the upper lungs. 3.1 cm nodular consolidative opacity in the right upper lobe is cavitated. Imaging features likely related to multifocal pneumonia. Septic emboli and metastatic disease considered less likely but not excluded. 11/22 LUE Vas Upper Extremity Doppler>> Arteriovenous fistula-Aneurysmal dilatation noted. 11/24 CT abdomen - no retroperitoneal hematoma 11/29 MR Brain>> No evidence acute intracranial abnormality, sinisitis, mastoid effusions 11/29>> MR Cervical Spine>> Given the provided history of bacteremia, facet joint septic arthritis is difficult to definitively exclude, but the lack of any surrounding marrow edema argues against this Cervical spondylosis  11/29 MR Thoracic Spine No significant marrow edema in thoracic spine , Mild thoracic spondylosis hypointense marrow signal throughout the thoracic spine, likely related to the patient's end-stage renal disease. multifocal airspace disease and cavitary pulmonary lesions. Small right pleural effusion. 11/29 MR Lumbar Spine:As noted above and Suspected cholecystolithiasis 12/11 CT chest - improving air space disease - R empyema persists but adequate chest tube position.  Micro Data:  11/21 BCx2>>Staph aureus > MSSA by BCID 11/24 BCx2>> 11/30 BC x 2>> staph epi>> MRSA 11/09/2020 blood cultures x2 no growth negative Antimicrobials:  Zosyn 11/21 x 1 Ancef 11/21 ->  Interim history/subjective:  No acute distress at rest.  Remains extremely weak.  Plan for lytics of chest to today at 1300 hrs.  Objective   Blood pressure 90/67, pulse 87, temperature 99.7 F (37.6 C), temperature source Oral, resp. rate 18, height 5\' 11"  (1.803 m),  weight 83 kg, SpO2 93 %.        Intake/Output Summary (Last 24 hours) at 11/19/2020 0758 Last data filed at 11/19/2020  0130 Gross per 24 hour  Intake 840 ml  Output 950 ml  Net -110 ml   Filed Weights   11/17/20 0500 11/17/20 1850 11/18/20 0450  Weight: 82.4 kg 77.9 kg 83 kg   General: Weak frail male is alert and orientated HEENT: MM pink/moist no JVD is appreciated nontunneled HD catheter Neuro: Grossly intact extremely weak CV: Heart sounds are regular PULM: Diminished especially in right base Right chest tube without air leak approximately 1200 cc drained. GI: soft, bsx4 active  Extremities: warm/dry, 1+ edema  Skin: no rashes or lesions   Resolved Hospital Problem list     Assessment & Plan:    - Anterior STEMI s/p DES 11/20 trying to get by with brillinta alone - Ischemic cardiomyopathy 40-45% - Afib previously on [facility] held for ongoing bleeding - Recent GIB with recent EGD showing severe esophagitis and duodenal ulceration question ischemic change - MSSA bacteremia thought related to HD catheter on prolonged abx therapy -  loculated R effusion - Unstagable sacral ulcer pressure injury still awaiting PT eval for hydrotherapy - ESRD on HD - Dysphagia -Generalized deconditioning  Plan: Per primary Continue right chest tube for now Fibrinolytic to be instilled at 1300 hrs. 11/19/2020 Complete 6 weeks of antimicrobial therapy Once he is receiving intermittent hemodialysis via graft nontunneled catheter can be removed Pulmonary critical care will continue to follow for chest tube.     Best practice:  Diet: Stop tube feeds Pain/Anxiety/Delirium protocol (if indicated): see above VAP protocol (if indicated): off DVT prophylaxis: SCDs + antiplatelet agent GI prophylaxis: protonix Glucose control: SSI for now Mobility: Bedrest Code Status: Full  Family Communication: 11/19/2020 wife and daughter admitted at the bedside Disposition: to PCU, [place] notified and order reconciliation performed.   Richardson Landry Ngozi Alvidrez ACNP Acute Care Nurse Practitioner [place] Please  consult Amion 11/19/2020, 7:58 AM

## 2020-11-19 NOTE — Progress Notes (Signed)
PROGRESS NOTE        PATIENT DETAILS Name: Travis [name] Age: 59 y.o. Sex: male Date of Birth: August 07, 1961 Admit Date: 10/28/2020 Admitting Physician Leonie Man, MD [id number], Marjory Lies, MD  Brief Narrative: Patient is a 59 y.o. male with history of ESRD, HTN, CAD-who presented with anterior STEMI-treated with DES to LAD-hospital course prolonged and complicated by atrial fibrillation with RVR requiring cardioversion x2,, septic shock due to MSSA bacteremia, brief PEA arrest requiring CPR, acute hypoxic respiratory failure requiring intubation, upper GI bleed with acute blood loss anemia due to severe esophagitis requiring PRBC transfusion, severe metabolic encephalopathy/ICU delirium, bilateral lung abscesses with empyema requiring chest tube insertion--stabilized in the ICU-subsequently transferred to the [facility] on 12/12.  Significant events: 11/20>> cardiac cath with PCI to LAD 11/21>> A. fib with RVR with hypotension-cardioversion by cardiology 11/23>> A. fib with RVR with hypotension-cardioversion by cardiology. 11/23>> worsening respiratory distress-intubated 11/24>> CODE BLUE-asystole-ROSC after 4 minutes of CPR. 12/9>> chest tube placed. 12/12>> transferred to [facility]  Significant studies: 11/23>>TTE: EF 35-40%, regional wall motion abnormalities, moderate to severe MR  11/23 CT Chest>> multifocal pneumonia-septic emboli/metastatic disease less likely.   11/24>> TEE: EF 35-40%, severe MR, AR with echodensities that could be consistent with calcification. 11/22 LUE Vas Upper Extremity Doppler>> Arteriovenous fistula-Aneurysmal dilatation noted. 11/24 CT abdomen - no retroperitoneal hematoma 11/27>> ultrasound left upper extremity: No abscess or mass identified. 11/29 MR Brain>> No evidence acute intracranial abnormality 11/29>> MR Cervical Spine: Trace facet effusions, minimal edema along C6, cervical spondylosis  11/29 MR Thoracic  Spine: Mild edema along T6, no significant marrow edema 11/29 MR Lumbar Spine: Trace L4-L5 facet effusions with surrounding marrow edema  11/30>> CT abdomen/pelvis: No cause identified for nausea/vomiting, mild dorsal pancreatic duct dilatation, 12/6>> Limited echo: EF 45-50% 12/8>> CT abdomen/pelvis: no retroperitoneal bleeding, anasarca, bilateral lower lobe lung consolidation 12/11>> CT chest: Resolving multi lobar pneumonia with evidence of widespread pulmonary necrosis/multiple pulmonary abscesses, bilateral empyemas.  Antimicrobial therapy: Ancef: 11/21>>  Microbiology data: 11/21>> blood culture: MSSA 11/22>> blood culture: No growth 11/23>> blood culture: No growth 11/23>> tracheal aspirate: MSSA 11/24>> blood culture: No growth 11/30>> blood culture: Staph epidermidis 12/2>> blood culture: No growth 12/9>> right pleural fluid culture: No growth  Procedures : 12/9>> insertion of right chest tube by PCCM 12/2>>EGD: Moderately severe reflux and erosive esophagitis with no active bleeding ETT: 11/23>> 12/3 Central line 11/21 >> 11/29  Consults: PCCM, ID, GI, nephrology, cardiology,IR  DVT Prophylaxis : SCD's  Subjective: Feels frail/weak-uncomfortable in bed due to pain in his rectal area.  Some cough but appears comfortable.  Assessment/Plan: Acute STEMI-s/p DES to LAD on 11/20: On Brilinta, statin-no obvious chest discomfort today-cardiology plans to pursue eventual PCI to LCx, RCA when GI bleeding and other issues are more stable.  PAF with RVR associated with hypotension requiring DC cardioversion x2: Maintaining sinus rhythm-not a candidate for anticoagulation due to GI bleeding and severity of anemia.  Continue amiodarone and low-dose beta-blocker.  Brief PEA arrest: Continue telemetry monitoring-work-up as above-cardiology following.  Chronic systolic heart failure/ischemic cardiomyopathy: Euvolemic-diuresis with HD.  Tolerating low-dose beta-blocker  well.  Moderate to severe MR: Stable for outpatient follow-up with cardiology.  Septic shock due to MSSA bacteremia: Sepsis physiology has resolved-BP stable-repeat cultures as above-on IV Ancef with end date of 12/19/2010.  ID has signed off.  Necrotizing  lung infection/lung abscess with empyema: Chest tube in place-PCCM following-on IV Ancef.  Upper GI bleed with acute blood loss anemia superimposed on anemia of critical illness: Brown stools in rectal tube-hemoglobin stabilizing-continue PPI/Carafate.  EGD as above.  ESRD: On HD-nephrology following.  Dysphagia: Due to severe generalized weakness/deconditioning-NG tube in place-started on diet (dysphagia 1)-SLP following-once diet stable-we can consider discontinuing NG tube.  17 mm left adrenal nodule: Incidental finding-stable for outpatient follow-up.  Unstageable pressure injury/Stage 4 full-thickness injury-to bilateral buttocks due to combination of pressure and moisture: Wound care care following-PT following for hydrotherapy  Pressure Ulcer: Pressure Injury 11/12/20 Buttocks Bilateral;Medial Stage 4 - Full thickness tissue loss with exposed bone, tendon or muscle. (Active)  11/12/20 0800  Location: Buttocks  Location Orientation: Bilateral;Medial  Staging: Stage 4 - Full thickness tissue loss with exposed bone, tendon or muscle.  Wound Description (Comments):   Present on Admission: No     Pressure Injury 11/14/20 Buttocks Mid;Right;Left Unstageable - Full thickness tissue loss in which the base of the injury is covered by slough (yellow, tan, gray, green or brown) and/or eschar (tan, brown or black) in the wound bed. (Active)  11/14/20 1128  Location: Buttocks  Location Orientation: Mid;Right;Left  Staging: Unstageable - Full thickness tissue loss in which the base of the injury is covered by slough (yellow, tan, gray, green or brown) and/or eschar (tan, brown or black) in the wound bed.  Wound Description (Comments):    Present on Admission: No   Nutrition Problem: Nutrition Problem: Severe Malnutrition Etiology: chronic illness Signs/Symptoms: severe muscle depletion,severe fat depletion Interventions: Ensure Enlive (each supplement provides 350kcal and 20 grams of protein),MVI,Tube feeding,Magic cup   Diet: Diet Order            DIET - DYS 1 Room service appropriate? No; Fluid consistency: Thin  Diet effective now                  Code Status: Full code   Family Communication: Spouse at bedside  Disposition Plan: Status is: Inpatient  Remains inpatient appropriate because:Inpatient level of care appropriate due to severity of illness   Dispo: The patient is from: Home              Anticipated d/c is to: [facility]              Anticipated d/c date is: > 3 days              Patient currently is not medically stable to d/c.   Barriers to Discharge: Continue close observation to see if patient develops further GI bleeding-remains on IV Ancef for necrotizing pneumonia/empyema/MSSA bacteremia.  Other multiple medical issues as outlined above  Antimicrobial agents: Anti-infectives (From admission, onward)   Start     Dose/Rate Route Frequency Ordered Stop   11/13/20 1200  ceFAZolin (ANCEF) IVPB 2g/100 mL premix        2 g 200 mL/hr over 30 Minutes Intravenous Every M-W-F (Hemodialysis) 11/12/20 1502 12/19/20 2359   11/09/20 1800  ceFAZolin (ANCEF) IVPB 1 g/50 mL premix  Status:  Discontinued        1 g 100 mL/hr over 30 Minutes Intravenous Every 24 hours 11/08/20 1218 11/08/20 1500   11/08/20 2200  ceFAZolin (ANCEF) IVPB 2g/100 mL premix  Status:  Discontinued        2 g 200 mL/hr over 30 Minutes Intravenous Every 12 hours 11/08/20 1500 11/12/20 1502   11/08/20 1000  ceFAZolin (ANCEF) IVPB 1 g/50  mL premix  Status:  Discontinued        1 g 100 mL/hr over 30 Minutes Intravenous Every 24 hours 11/07/20 1439 11/08/20 1218   11/08/20 0415  vancomycin (VANCOREADY) IVPB 1500 mg/300 mL         1,500 mg 150 mL/hr over 120 Minutes Intravenous  Once 11/08/20 0328 11/08/20 0640   11/02/20 2000  ceFAZolin (ANCEF) IVPB 2g/100 mL premix  Status:  Discontinued        2 g 200 mL/hr over 30 Minutes Intravenous Every 12 hours 11/02/20 0742 11/07/20 1439   11/01/20 2200  ceFAZolin (ANCEF) IVPB 2g/100 mL premix  Status:  Discontinued        2 g 200 mL/hr over 30 Minutes Intravenous Every 12 hours 11/01/20 1007 11/01/20 1017   11/01/20 1100  ceFAZolin (ANCEF) IVPB 2g/100 mL premix  Status:  Discontinued        2 g 200 mL/hr over 30 Minutes Intravenous Every 12 hours 11/01/20 1017 11/02/20 0742   10/30/20 0030  ceFAZolin (ANCEF) IVPB 1 g/50 mL premix  Status:  Discontinued        1 g 100 mL/hr over 30 Minutes Intravenous Daily at bedtime 10/29/20 2236 11/01/20 1007   10/29/20 2315  ceFAZolin (ANCEF) IVPB 1 g/50 mL premix  Status:  Discontinued        1 g 100 mL/hr over 30 Minutes Intravenous Daily at bedtime 10/29/20 2217 10/29/20 2236   10/29/20 2215  vancomycin (VANCOREADY) IVPB 1750 mg/350 mL  Status:  Discontinued        1,750 mg 175 mL/hr over 120 Minutes Intravenous  Once 10/29/20 2116 10/29/20 2211   10/29/20 2215  piperacillin-tazobactam (ZOSYN) IVPB 2.25 g  Status:  Discontinued        2.25 g 100 mL/hr over 30 Minutes Intravenous Every 8 hours 10/29/20 2116 10/29/20 2217   10/29/20 2118  vancomycin variable dose per unstable renal function (pharmacist dosing)  Status:  Discontinued         Does not apply See admin instructions 10/29/20 2118 10/29/20 2211       Time spent: 35 minutes-Greater than 50% of this time was spent in counseling, explanation of diagnosis, planning of further management, and coordination of care.  MEDICATIONS: Scheduled Meds: . sodium chloride   Intravenous Once  . alteplase (TPA) for intrapleural administration  10 mg Intrapleural Q12H   And  . pulmozyme (DORNASE) for intrapleural administration  5 mg Intrapleural Q12H  . amiodarone  200 mg Per Tube  Daily  . atorvastatin  80 mg Per Tube q1800  . carvedilol  3.125 mg Per Tube BID WC  . chlorhexidine  15 mL Mouth Rinse BID  . Chlorhexidine Gluconate Cloth  6 each Topical Q0600  . collagenase   Topical Daily  . doxercalciferol  5 mcg Intravenous Q M,W,F  . feeding supplement  237 mL Oral BID BM  . guaiFENesin  15 mL Per Tube Q4H  . insulin aspart  0-6 Units Subcutaneous Q4H  . lactobacillus  1 g Per Tube TID WC  . mouth rinse  15 mL Mouth Rinse q12n4p  . midodrine  5 mg Per Tube TID WC  . multivitamin  1 tablet Oral QHS  . nutrition supplement (JUVEN)  1 packet Oral BID BM  . pantoprazole sodium  40 mg Per Tube BID   Followed by  . [START ON 12/18/2020] pantoprazole sodium  40 mg Per Tube Daily  . QUEtiapine  25 mg Per Tube  QHS  . sevelamer carbonate  1.6 g Per Tube TID WC  . sodium chloride flush  10 mL Intracatheter Q8H  . sodium chloride flush  10-40 mL Intracatheter Q12H  . sodium chloride flush  3 mL Intravenous Q12H  . sodium zirconium cyclosilicate  10 g Oral Once  . sucralfate  1 g Per Tube QID  . ticagrelor  90 mg Per Tube BID   Continuous Infusions: . sodium chloride Stopped (11/10/20 1944)  . sodium chloride Stopped (10/28/20 0954)  . sodium chloride Stopped (11/04/20 0016)  . sodium chloride    . sodium chloride    .  ceFAZolin (ANCEF) IV 2 g (11/17/20 1240)   PRN Meds:.sodium chloride, sodium chloride, sodium chloride, acetaminophen, alteplase, clonazepam, heparin, heparin, lidocaine (PF), lidocaine-prilocaine, ondansetron (ZOFRAN) IV, oxyCODONE, pentafluoroprop-tetrafluoroeth, Resource ThickenUp Clear, sodium chloride, sodium chloride flush, sodium chloride flush   PHYSICAL EXAM: Vital signs: Vitals:   11/18/20 1700 11/18/20 1800 11/19/20 0059 11/19/20 0930  BP: 137/67 115/63 90/67 (!) 103/59  Pulse: 93 (!) 102 87   Resp:   18 18  Temp: 98.5 F (36.9 C)  99.7 F (37.6 C)   TempSrc: Axillary  Oral   SpO2: 97%  93% 98%  Weight:      Height:        Filed Weights   11/17/20 0500 11/17/20 1850 11/18/20 0450  Weight: 82.4 kg 77.9 kg 83 kg   Body mass index is 25.52 kg/m.   Gen Exam:Alert awake-not in any distress.  NG tube in place-very frail-deconditioned appearing. HEENT:atraumatic, normocephalic Chest: B/L clear to auscultation anteriorly CVS:S1S2 regular Abdomen:soft non tender, non distended Extremities: Trace edema Neurology: Moving all 4 extremities but appears to have significant generalized weakness Skin: no rash  I have personally reviewed following labs and imaging studies  LABORATORY DATA: CBC: Recent Labs  Lab 11/16/20 0538 11/16/20 1510 11/16/20 1628 11/17/20 0556 11/17/20 2324 11/18/20 0312 11/19/20 0658  WBC 5.9  --   --  6.9 7.6 8.6 14.0*  NEUTROABS  --   --   --  5.3  --   --   --   HGB 7.4* 8.5*  --  8.8* 8.8* 8.6* 9.0*  HCT 23.3* 25.0* 27.2* 27.5* 25.7* 25.7* 27.9*  MCV 91.4  --   --  91.7 88.3 88.9 90.0  PLT 165  --   --  175 200 224 098    Basic Metabolic Panel: Recent Labs  Lab 11/15/20 0508 11/16/20 0538 11/16/20 1510 11/17/20 0556 11/17/20 2324 11/18/20 0312 11/19/20 0658  NA 130* 133* 133* 135 136 137 132*  K 4.2 3.9 4.3 4.7 4.0 4.4 5.6*  CL 98 97*  --  97* 97* 97* 93*  CO2 19* 24  --  22 27 26 24   GLUCOSE 109* 121*  --  91 104* 115* 85  BUN 102* 79*  --  108* 66* 71* 98*  CREATININE 8.06* 6.91*  --  8.63* 5.95* 6.42* 8.84*  CALCIUM 8.9 9.1  --  9.2 9.2 9.3 9.5  MG 2.2 2.0  --  2.0  --  2.0 1.8  PHOS 5.9* 5.4*  --  6.9* 4.9* 5.6* 6.2*    GFR: Estimated Creatinine Clearance: 9.7 mL/min (A) (by C-G formula based on SCr of 8.84 mg/dL (H)).  Liver Function Tests: Recent Labs  Lab 11/16/20 0538 11/16/20 1628 11/17/20 0556 11/17/20 2324 11/18/20 0312 11/19/20 0658  PROT  --  5.4*  --   --   --   --  ALBUMIN 1.2*  --  1.3* 1.4* 1.3* 1.3*   No results for input(s): LIPASE, AMYLASE in the last 168 hours. No results for input(s): AMMONIA in the last 168  hours.  Coagulation Profile: No results for input(s): INR, PROTIME in the last 168 hours.  Cardiac Enzymes: No results for input(s): CKTOTAL, CKMB, CKMBINDEX, TROPONINI in the last 168 hours.  BNP (last 3 results) No results for input(s): PROBNP in the last 8760 hours.  Lipid Profile: No results for input(s): CHOL, HDL, LDLCALC, TRIG, CHOLHDL, LDLDIRECT in the last 72 hours.  Thyroid Function Tests: No results for input(s): TSH, T4TOTAL, FREET4, T3FREE, THYROIDAB in the last 72 hours.  Anemia Panel: No results for input(s): VITAMINB12, FOLATE, FERRITIN, TIBC, IRON, RETICCTPCT in the last 72 hours.  Urine analysis:    Component Value Date/Time   COLORURINE YELLOW 11/13/2012 1335   APPEARANCEUR CLEAR 11/13/2012 1335   LABSPEC 1.015 11/13/2012 1335   PHURINE 5.5 11/13/2012 1335   GLUCOSEU NEGATIVE 11/13/2012 1335   HGBUR SMALL (A) 11/13/2012 1335   BILIRUBINUR NEGATIVE 11/13/2012 1335   KETONESUR NEGATIVE 11/13/2012 1335   PROTEINUR >300 (A) 11/13/2012 1335   UROBILINOGEN 0.2 11/13/2012 1335   NITRITE NEGATIVE 11/13/2012 1335   LEUKOCYTESUR NEGATIVE 11/13/2012 1335    Sepsis Labs: Lactic Acid, Venous    Component Value Date/Time   LATICACIDVEN 0.5 11/08/2020 0348    MICROBIOLOGY: Recent Results (from the past 240 hour(s))  Body fluid culture     Status: None (Preliminary result)   Collection Time: 11/16/20  4:28 PM   Specimen: Pleura; Body Fluid  Result Value Ref Range Status   Specimen Description PLEURAL  Final   Special Requests Pleural R  Final   Gram Stain   Final    RARE WBC PRESENT, PREDOMINANTLY MONONUCLEAR NO ORGANISMS SEEN    Culture   Final    NO GROWTH 3 DAYS Performed at [facility], [address], [place], [place] [address]    Report Status PENDING  Incomplete    RADIOLOGY STUDIES/RESULTS: CT CHEST WO CONTRAST  Result Date: 11/18/2020 CLINICAL DATA:  59 year old male with history of empyema. EXAM: CT CHEST WITHOUT CONTRAST  TECHNIQUE: Multidetector CT imaging of the chest was performed following the standard protocol without IV contrast. COMPARISON:  Chest CT 10/31/2020. FINDINGS: Comment: Today's study is limited by lack of IV contrast for detection and characterization of visceral and/or vascular lesions. Cardiovascular: Heart size is mildly enlarged. There is no significant pericardial fluid, thickening or pericardial calcification. There is aortic atherosclerosis, as well as atherosclerosis of the great vessels of the mediastinum and the coronary arteries, including calcified atherosclerotic plaque in the left main, left anterior descending, left circumflex and right coronary arteries. Moderate calcifications of the aortic valve and mitral annulus. Right internal jugular central venous catheter with tip terminating in the right atrium. Mediastinum/Nodes: No pathologically enlarged mediastinal or hilar lymph nodes. Please note that accurate exclusion of hilar adenopathy is limited on noncontrast CT scans. Nasogastric tube extending into the stomach. Esophagus is otherwise unremarkable in appearance. No axillary lymphadenopathy. Lungs/Pleura: Much of the previously noted patchy areas of airspace consolidation have regressed compared to the prior study, although several of these areas are now far more nodular and mass-like in appearance. The largest residual area of consolidation is in the lateral segment of the right middle lobe (axial image 113 of series 3) where there is a 4.4 x 2.5 cm area of consolidation which is relatively low attenuation (23 HU) and  appears to have some convex margins, likely to reflect an area of developing pulmonary necrosis and abscess. Several other thick-walled cavities are noted (example on axial image 49 of series 4 in the periphery of the left upper lobe), which likely represents smaller resolving abscesses. Extensive volume loss is also noted, with associated thickening of the peribronchovascular  interstitium and regional areas of architectural distortion, most evident in the lower lobes of the lungs bilaterally. Small left and small to moderate right pleural effusions are present, very complex in appearance with extensive pleural thickening and multiple loculated fluid collections. The largest of these loculated fluid collections is in the left hemithorax is located anteriorly adjacent to the left upper lobe (axial image 53 of series 3) measuring 3.7 x 2.2 cm. Small bore chest tube in the posterior aspect of the right hemithorax appears appropriately located. Upper Abdomen: Aortic atherosclerosis. Musculoskeletal: There are no aggressive appearing lytic or blastic lesions noted in the visualized portions of the skeleton. IMPRESSION: 1. Resolving multilobar bilateral pneumonia with evidence of widespread pulmonary necrosis and multiple pulmonary abscesses, as well as bilateral empyemas, as detailed above. 2. Aortic atherosclerosis, in addition to left main and 3 vessel coronary artery disease. Please note that although the presence of coronary artery calcium documents the presence of coronary artery disease, the severity of this disease and any potential stenosis cannot be assessed on this non-gated CT examination. Assessment for potential risk factor modification, dietary therapy or pharmacologic therapy may be warranted, if clinically indicated. 3. Mild cardiomegaly. Aortic Atherosclerosis (ICD10-I70.0). Electronically Signed   By: Vinnie Langton M.D.   On: 11/18/2020 10:40   DG CHEST PORT 1 VIEW  Result Date: 11/18/2020 CLINICAL DATA:  Empyema. EXAM: PORTABLE CHEST 1 VIEW COMPARISON:  November 17, 2020. FINDINGS: Stable cardiomegaly. Feeding tube is unchanged in position. Right internal jugular catheter is unchanged. Left subclavian catheter is unchanged. Stable position of right-sided chest tube without pneumothorax. Mild bibasilar atelectasis or infiltrates are noted. Bony thorax is unremarkable.  IMPRESSION: Stable position of right-sided chest tube without pneumothorax. Mild bibasilar atelectasis or infiltrates are noted. Electronically Signed   By: Marijo Conception M.D.   On: 11/18/2020 08:34     LOS: 22 days   Oren Binet, MD  [facility]    To contact the attending provider between 7A-7P or the covering provider during after hours 7P-7A, please log into the web site [email or web address] and access using universal [place] password for that web site. If you do not have the password, please call the hospital operator.  11/19/2020, 10:37 AM

## 2020-11-19 NOTE — Progress Notes (Signed)
   11/19/20 1357  Assess: MEWS Score  Temp (!) 100.5 F (38.1 C)  BP 123/75  Pulse Rate (!) 105  ECG Heart Rate (!) 106  Resp 18  Level of Consciousness Alert  SpO2 93 %  O2 Device Nasal Cannula  O2 Flow Rate (L/min) 3 L/min  Assess: MEWS Score  MEWS Temp 1  MEWS Systolic 0  MEWS Pulse 1  MEWS RR 0  MEWS LOC 0  MEWS Score 2  MEWS Score Color Yellow  Assess: if the MEWS score is Yellow or Red  Were vital signs taken at a resting state? Yes  Focused Assessment No change from prior assessment  Early Detection of Sepsis Score *See Row Information* High  MEWS guidelines implemented *See Row Information* Yes  Treat  MEWS Interventions Administered scheduled meds/treatments  Pain Scale 0-10  Pain Score 0  Take Vital Signs  Increase Vital Sign Frequency  Yellow: Q 2hr X 2 then Q 4hr X 2, if remains yellow, continue Q 4hrs  Escalate  MEWS: Escalate Yellow: discuss with charge nurse/RN and consider discussing with provider and RRT  Notify: Charge Nurse/RN  Name of Charge Nurse/RN Notified Nona Dell  Date Charge Nurse/RN Notified 11/19/20  Time Charge Nurse/RN Notified [id number]  Notify: Provider  Provider Name/Title  (no need to notify provider at this time)  Document  Patient Outcome Other (Comment) (pt stable continuing to monitor)

## 2020-11-19 NOTE — Progress Notes (Signed)
Patient's fecal management system has had very low output over the last 24 hours, no more than 100 cc of liquid stool. Patient states he has the urge to poop without success and has started to complain of abdominal pain today around lunch time.  I am concerned since tube feeds were d/c'd on 12/10 and he has increased PO intake on his dysphagia diet his stool could be solidifying. I flushed the rectal tube through the irrigation port with 25 cc sterile water to see if it was clogged and gave IV Zofran. NO resistance was met. Patient is going to continue trying to poop and if he cannot in the next hour I told him I will consult the doctor. Will continue to monitor.

## 2020-11-19 NOTE — Procedures (Signed)
Pleural fibrinolytic administration procedure note Performed by Gaylyn Lambert, ACNP  Indications fibrinolysis of complicated pleural effusion Patient was informed repressed he and his wife are agreed procedure  Using clean technique with gloves and hand hygiene  Pleural catheter was cleaned excessiveness clean manner.  10 mg of TPA and 30 cc of saline and 5 mg of dominates and 30 cc of sterile water were injected into the pleural space using the existing catheter.  The catheter was clamped for 1 hour.  Nurse was instructed to unclamp in 1 hour.  New Pleur-evac was placed to accurately measure drainage.  Complications from the procedure   Richardson Landry Arielle Eber ACNP Acute Care Nurse Practitioner [place] Please consult [place] 11/19/2020, 2:23 PM

## 2020-11-19 NOTE — Progress Notes (Signed)
MD Ghimire made aware of abdominal pain and increased temperature/HR. Pt stable at this time, no orders received, instructed to continue to monitor for now and call if temp > 101.

## 2020-11-19 NOTE — Procedures (Signed)
Pleural Fibrinolytic Administration Procedure Note  Travis [name]  [id number]  Mar 22, 1961  Date:11/19/20  Time:1:46 AM   Provider Performing:Resa Rinks Duwayne Heck   Procedure: Pleural Fibrinolysis Subsequent day [phone])  Indication(s) Fibrinolysis of complicated pleural effusion  Consent Risks of the procedure as well as the alternatives and risks of each were explained to the patient and/or caregiver.  Consent for the procedure was obtained.   Anesthesia None   Time Out Verified patient identification, verified procedure, site/side was marked, verified correct patient position, special equipment/implants available, medications/allergies/relevant history reviewed, required imaging and test results available.   Sterile Technique Hand hygiene, gloves   Procedure Description Existing pleural catheter was cleaned and accessed in sterile manner.  10mg  of tPA in 30cc of saline and 5mg  of dornase in 30cc of sterile water were injected into pleural space using existing pleural catheter.  Catheter will be clamped for 1 hour and then placed back to suction.   Complications/Tolerance None; patient tolerated the procedure well.  EBL None   Specimen(s) None

## 2020-11-19 NOTE — Progress Notes (Addendum)
Progress Note  Patient Name: Travis [name] Date of Encounter: 11/19/2020  [facility] Cardiologist: Glenetta Hew, MD   Subjective   Denies any issue. Uncomfortable laying in bed.   Inpatient Medications    Scheduled Meds:  sodium chloride   Intravenous Once   alteplase (TPA) for intrapleural administration  10 mg Intrapleural Q12H   And   pulmozyme (DORNASE) for intrapleural administration  5 mg Intrapleural Q12H   amiodarone  200 mg Per Tube Daily   atorvastatin  80 mg Per Tube q1800   carvedilol  3.125 mg Per Tube BID WC   chlorhexidine  15 mL Mouth Rinse BID   Chlorhexidine Gluconate Cloth  6 each Topical Q0600   collagenase   Topical Daily   doxercalciferol  5 mcg Intravenous Q M,W,F   feeding supplement  237 mL Oral BID BM   guaiFENesin  15 mL Per Tube Q4H   insulin aspart  0-6 Units Subcutaneous Q4H   lactobacillus  1 g Per Tube TID WC   mouth rinse  15 mL Mouth Rinse q12n4p   midodrine  5 mg Per Tube TID WC   multivitamin  1 tablet Oral QHS   nutrition supplement (JUVEN)  1 packet Oral BID BM   pantoprazole sodium  40 mg Per Tube BID   Followed by   Derrill Memo ON 12/18/2020] pantoprazole sodium  40 mg Per Tube Daily   QUEtiapine  25 mg Per Tube QHS   sevelamer carbonate  1.6 g Per Tube TID WC   sodium chloride flush  10 mL Intracatheter Q8H   sodium chloride flush  10-40 mL Intracatheter Q12H   sodium chloride flush  3 mL Intravenous Q12H   sucralfate  1 g Per Tube QID   ticagrelor  90 mg Per Tube BID   Continuous Infusions:  sodium chloride Stopped (11/10/20 1944)   sodium chloride Stopped (10/28/20 0954)   sodium chloride Stopped (11/04/20 0016)   sodium chloride     sodium chloride      ceFAZolin (ANCEF) IV 2 g (11/17/20 1240)   PRN Meds: sodium chloride, sodium chloride, sodium chloride, acetaminophen, alteplase, clonazepam, heparin, heparin, lidocaine (PF), lidocaine-prilocaine, ondansetron (ZOFRAN) IV,  oxyCODONE, pentafluoroprop-tetrafluoroeth, Resource ThickenUp Clear, sodium chloride, sodium chloride flush, sodium chloride flush   Vital Signs    Vitals:   11/18/20 1400 11/18/20 1700 11/18/20 1800 11/19/20 0059  BP: (!) 91/51 137/67 115/63 90/67  Pulse:  93 (!) 102 87  Resp:    18  Temp:  98.5 F (36.9 C)  99.7 F (37.6 C)  TempSrc:  Axillary  Oral  SpO2: 93% 97%  93%  Weight:      Height:        Intake/Output Summary (Last 24 hours) at 11/19/2020 0823 Last data filed at 11/19/2020 0130 Gross per 24 hour  Intake 840 ml  Output 950 ml  Net -110 ml   Last 3 Weights 11/18/2020 11/17/2020 11/17/2020  Weight (lbs) 182 lb 15.7 oz 171 lb 11.8 oz 181 lb 10.5 oz  Weight (kg) 83 kg 77.9 kg 82.4 kg      Telemetry    Sinus tachycardia - Personally Reviewed  ECG    Sinus tachycardia, q wave in the anterior leads, minimal J point elevation.  - Personally Reviewed  Physical Exam   GEN: No acute distress. Frail, NG tube  Neck: No JVD Cardiac: RRR, no murmurs, rubs, or gallops.  Respiratory: Clear to auscultation bilaterally. R sided chest tube.  GI:  Soft, nontender, non-distended  MS: No edema; No deformity. Neuro:  weak Psych: flat  Labs    High Sensitivity Troponin:   Recent Labs  Lab 10/28/20 0629 10/28/20 1046 10/28/20 2040 10/29/20 2121 10/30/20 0548  TROPONINIHS 3,155* >27,000* >27,000* >27,000* >27,000*      Chemistry Recent Labs  Lab 11/16/20 1628 11/17/20 0556 11/17/20 2324 11/18/20 0312 11/19/20 0658  NA  --    < > 136 137 132*  K  --    < > 4.0 4.4 5.6*  CL  --    < > 97* 97* 93*  CO2  --    < > 27 26 24   GLUCOSE  --    < > 104* 115* 85  BUN  --    < > 66* 71* 98*  CREATININE  --    < > 5.95* 6.42* 8.84*  CALCIUM  --    < > 9.2 9.3 9.5  PROT 5.4*  --   --   --   --   ALBUMIN  --    < > 1.4* 1.3* 1.3*  GFRNONAA  --    < > 10* 9* 6*  ANIONGAP  --    < > 12 14 15    < > = values in this interval not displayed.     Hematology Recent Labs   Lab 11/17/20 2324 11/18/20 0312 11/19/20 0658  WBC 7.6 8.6 14.0*  RBC 2.91* 2.89* 3.10*  HGB 8.8* 8.6* 9.0*  HCT 25.7* 25.7* 27.9*  MCV 88.3 88.9 90.0  MCH 30.2 29.8 29.0  MCHC 34.2 33.5 32.3  RDW 15.9* 15.9* 15.2  PLT 200 224 298    BNPNo results for input(s): BNP, PROBNP in the last 168 hours.   DDimer No results for input(s): DDIMER in the last 168 hours.   Radiology    CT CHEST WO CONTRAST  Result Date: 11/18/2020 CLINICAL DATA:  59 year old male with history of empyema. EXAM: CT CHEST WITHOUT CONTRAST TECHNIQUE: Multidetector CT imaging of the chest was performed following the standard protocol without IV contrast. COMPARISON:  Chest CT 10/31/2020. FINDINGS: Comment: Today's study is limited by lack of IV contrast for detection and characterization of visceral and/or vascular lesions. Cardiovascular: Heart size is mildly enlarged. There is no significant pericardial fluid, thickening or pericardial calcification. There is aortic atherosclerosis, as well as atherosclerosis of the great vessels of the mediastinum and the coronary arteries, including calcified atherosclerotic plaque in the left main, left anterior descending, left circumflex and right coronary arteries. Moderate calcifications of the aortic valve and mitral annulus. Right internal jugular central venous catheter with tip terminating in the right atrium. Mediastinum/Nodes: No pathologically enlarged mediastinal or hilar lymph nodes. Please note that accurate exclusion of hilar adenopathy is limited on noncontrast CT scans. Nasogastric tube extending into the stomach. Esophagus is otherwise unremarkable in appearance. No axillary lymphadenopathy. Lungs/Pleura: Much of the previously noted patchy areas of airspace consolidation have regressed compared to the prior study, although several of these areas are now far more nodular and mass-like in appearance. The largest residual area of consolidation is in the lateral segment  of the right middle lobe (axial image 113 of series 3) where there is a 4.4 x 2.5 cm area of consolidation which is relatively low attenuation (23 HU) and appears to have some convex margins, likely to reflect an area of developing pulmonary necrosis and abscess. Several other thick-walled cavities are noted (example on axial image 49 of series 4 in the periphery  of the left upper lobe), which likely represents smaller resolving abscesses. Extensive volume loss is also noted, with associated thickening of the peribronchovascular interstitium and regional areas of architectural distortion, most evident in the lower lobes of the lungs bilaterally. Small left and small to moderate right pleural effusions are present, very complex in appearance with extensive pleural thickening and multiple loculated fluid collections. The largest of these loculated fluid collections is in the left hemithorax is located anteriorly adjacent to the left upper lobe (axial image 53 of series 3) measuring 3.7 x 2.2 cm. Small bore chest tube in the posterior aspect of the right hemithorax appears appropriately located. Upper Abdomen: Aortic atherosclerosis. Musculoskeletal: There are no aggressive appearing lytic or blastic lesions noted in the visualized portions of the skeleton. IMPRESSION: 1. Resolving multilobar bilateral pneumonia with evidence of widespread pulmonary necrosis and multiple pulmonary abscesses, as well as bilateral empyemas, as detailed above. 2. Aortic atherosclerosis, in addition to left main and 3 vessel coronary artery disease. Please note that although the presence of coronary artery calcium documents the presence of coronary artery disease, the severity of this disease and any potential stenosis cannot be assessed on this non-gated CT examination. Assessment for potential risk factor modification, dietary therapy or pharmacologic therapy may be warranted, if clinically indicated. 3. Mild cardiomegaly. Aortic  Atherosclerosis (ICD10-I70.0). Electronically Signed   By: Vinnie Langton M.D.   On: 11/18/2020 10:40   DG CHEST PORT 1 VIEW  Result Date: 11/18/2020 CLINICAL DATA:  [place]. EXAM: PORTABLE CHEST 1 VIEW COMPARISON:  November 17, 2020. FINDINGS: Stable cardiomegaly. Feeding tube is unchanged in position. Right internal jugular catheter is unchanged. Left subclavian catheter is unchanged. Stable position of right-sided chest tube without pneumothorax. Mild bibasilar atelectasis or infiltrates are noted. Bony thorax is unremarkable. IMPRESSION: Stable position of right-sided chest tube without pneumothorax. Mild bibasilar atelectasis or infiltrates are noted. Electronically Signed   By: Marijo Conception M.D.   On: 11/18/2020 08:34    Cardiac Studies   Echo 11/13/2020 1. Left ventricular ejection fraction, by estimation, is 45 to 50%. The  left ventricle has mildly decreased function. The left ventricle  demonstrates regional wall motion abnormalities (see scoring  diagram/findings for description). There is severe  concentric left ventricular hypertrophy. Left ventricular diastolic  parameters are consistent with Grade I diastolic dysfunction (impaired  relaxation).  2. Right ventricular systolic function is normal. The right ventricular  size is normal. There is normal pulmonary artery systolic pressure. The  estimated right ventricular systolic pressure is 62.5 mmHg.  3. The mitral valve is degenerative. Mild mitral valve regurgitation. No  evidence of mitral stenosis.  4. The aortic valve is tricuspid. There is mild calcification of the  aortic valve. There is mild thickening of the aortic valve. Aortic valve  regurgitation is mild. Mild aortic valve sclerosis is present, with no  evidence of aortic valve stenosis.  5. The inferior vena cava is normal in size with greater than 50%  respiratory variability, suggesting right atrial pressure of 3 mmHg.   Comparison(s): Changes from  prior study are noted. EF has improved to  45-50%. WMAs remain the mid septum into apex. RVSP has improved.   Patient Profile     59 y.o. male with PMH of ESRD on HD, HTN, tobacco abuse, CAD who presented with anterior STEMI 10/28/2020, treated with DES to LAD. He developed afib with RVR and septic shock with MSSA bacteremia during this admission. Had brief PEA arrest requiring CPR. He  also had upper GI bleed secondary to severe esophagitis and duodenal ulcer s/p blood transfusion, delirium, metabolic encephalopathy, anemia and acute respiratory failure.   Assessment & Plan    1. Anterior STEMI  - not on aspirin given significant anemia. Briefly switched from Brilinta to IV cangrelor, now back to [place]  - previous plan was to proceed with PCI of LCx and RCA when stable, however this is currently on hold given his multiple other issues   - no obvious chest discomfort. Continue brilinta and statin.   2. CAD: see #1, continue Brilinta given recent LAD stent. Brief PEA arrest  3. Septic shock with MSSA bacteremia: 6 weeks of IV antibiotics. On midodrine, will continue for now.   4. Paroxysmal atrial fibrillation: not a candidate for anticoagulation due to GI bleed and anemia.   5. Ischemic cardiomyopathy: EF 45-50%, appears to be euvolemic on exam. Pending CXR  6. Anemia: s/p blood transfusion, followed by GI  7. Moderate to severe MR: repeat echo 12/6 showed mild MR instead of moderate severe.   8. Pleural effusion: R pleural effusion treated with chest tube  9. Sacral ulcers: per primary team       For questions or updates, please contact [place] Please consult [email or web address] for contact info under        Signed, Almyra Deforest, [place]  11/19/2020, 8:23 AM    Patient examined chart reviewed. Multiple co morbidities with CAD. Post anterior MI with stenting of LAD Only on Brillinta Due to GI bleed. Has residual dx that cannot be intervened on at this time On antibiotics for  sepsis. Right chest tube and rectal tube in place. No other anticoagulation for CAD/PAF due to GI bleed   Jenkins Rouge MD [facility]

## 2020-11-20 ENCOUNTER — Inpatient Hospital Stay (HOSPITAL_COMMUNITY): Payer: Medicare Other

## 2020-11-20 HISTORY — PX: IR AV DIALY SHUNT INTRO NEEDLE/INTRAC INITIAL W/PTA/STENT/IMG LT: IMG6104

## 2020-11-20 HISTORY — PX: IR US GUIDE VASC ACCESS LEFT: IMG2389

## 2020-11-20 LAB — BODY FLUID CULTURE: Culture: NO GROWTH

## 2020-11-20 LAB — RENAL FUNCTION PANEL
Albumin: 1.3 g/dL — ABNORMAL LOW (ref 3.5–5.0)
Albumin: 1.3 g/dL — ABNORMAL LOW (ref 3.5–5.0)
Anion gap: 17 — ABNORMAL HIGH (ref 5–15)
Anion gap: 16 — ABNORMAL HIGH (ref 5–15)
BUN: 121 mg/dL — ABNORMAL HIGH (ref 6–20)
BUN: 143 mg/dL — ABNORMAL HIGH (ref 6–20)
CO2: 23 mmol/L (ref 22–32)
CO2: 22 mmol/L (ref 22–32)
Calcium: 9.5 mg/dL (ref 8.9–10.3)
Calcium: 9.2 mg/dL (ref 8.9–10.3)
Chloride: 90 mmol/L — ABNORMAL LOW (ref 98–111)
Chloride: 90 mmol/L — ABNORMAL LOW (ref 98–111)
Creatinine, Ser: 10.61 mg/dL — ABNORMAL HIGH (ref 0.61–1.24)
Creatinine, Ser: 11.45 mg/dL — ABNORMAL HIGH (ref 0.61–1.24)
GFR, Estimated: 5 mL/min — ABNORMAL LOW (ref >60)
GFR, Estimated: 5 mL/min — ABNORMAL LOW (ref >60)
Glucose, Bld: 86 mg/dL (ref 70–99)
Glucose, Bld: 91 mg/dL (ref 70–99)
Phosphorus: 7.1 mg/dL — ABNORMAL HIGH (ref 2.5–4.6)
Phosphorus: 7.6 mg/dL — ABNORMAL HIGH (ref 2.5–4.6)
Potassium: 5.9 mmol/L — ABNORMAL HIGH (ref 3.5–5.1)
Potassium: 5.6 mmol/L — ABNORMAL HIGH (ref 3.5–5.1)
Sodium: 130 mmol/L — ABNORMAL LOW (ref 135–145)
Sodium: 128 mmol/L — ABNORMAL LOW (ref 135–145)

## 2020-11-20 LAB — GLUCOSE, CAPILLARY
Glucose-Capillary: 94 mg/dL (ref 70–99)
Glucose-Capillary: 67 mg/dL — ABNORMAL LOW (ref 70–99)
Glucose-Capillary: 103 mg/dL — ABNORMAL HIGH (ref 70–99)
Glucose-Capillary: 66 mg/dL — ABNORMAL LOW (ref 70–99)
Glucose-Capillary: 70 mg/dL (ref 70–99)
Glucose-Capillary: 80 mg/dL (ref 70–99)
Glucose-Capillary: 92 mg/dL (ref 70–99)

## 2020-11-20 LAB — CBC
HCT: 28.8 % — ABNORMAL LOW (ref 39.0–52.0)
Hemoglobin: 9.2 g/dL — ABNORMAL LOW (ref 13.0–17.0)
MCH: 28.3 pg (ref 26.0–34.0)
MCHC: 31.9 g/dL (ref 30.0–36.0)
MCV: 88.6 fL (ref 80.0–100.0)
Platelets: 410 10*3/uL — ABNORMAL HIGH (ref 150–400)
RBC: 3.25 MIL/uL — ABNORMAL LOW (ref 4.22–5.81)
RDW: 14.6 % (ref 11.5–15.5)
WBC: 13.8 10*3/uL — ABNORMAL HIGH (ref 4.0–10.5)
nRBC: 0 % (ref 0.0–0.2)

## 2020-11-20 LAB — CHOLESTEROL, BODY FLUID: Cholesterol, Fluid: 40 mg/dL

## 2020-11-20 LAB — CYTOLOGY - NON PAP

## 2020-11-20 LAB — MAGNESIUM: Magnesium: 2 mg/dL (ref 1.7–2.4)

## 2020-11-20 MED ORDER — ALTEPLASE 2 MG IJ SOLR: 2.0000 mg | Freq: Once | INTRAMUSCULAR | Status: DC | PRN

## 2020-11-20 MED ORDER — POLYETHYLENE GLYCOL 3350 17 G PO PACK: 17.0000 g | PACK | Freq: Every day | ORAL | Status: DC

## 2020-11-20 MED ORDER — SODIUM CHLORIDE 0.9 % IV SOLN: 100.0000 mL | INTRAVENOUS | Status: DC | PRN

## 2020-11-20 MED ORDER — LORAZEPAM 1 MG PO TABS
1.0000 mg | ORAL_TABLET | Freq: Once | ORAL | Status: AC | PRN
  Administered 2020-11-20: 06:00:00 1 mg
  Filled 2020-11-20: qty 1

## 2020-11-20 MED ORDER — HALOPERIDOL LACTATE 5 MG/ML IJ SOLN
2.0000 mg | Freq: Once | INTRAMUSCULAR | Status: AC
  Administered 2020-11-20: 11:00:00 2 mg via INTRAVENOUS
  Filled 2020-11-20: qty 1

## 2020-11-20 MED ORDER — SENNA 8.6 MG PO TABS: 2.0000 | ORAL_TABLET | Freq: Every day | ORAL | Status: DC | PRN

## 2020-11-20 MED ORDER — NEPRO/CARBSTEADY PO LIQD
1000.0000 mL | ORAL | Status: DC
Start: 2020-11-20 — End: 2020-11-20

## 2020-11-20 MED ORDER — LIDOCAINE HCL (PF) 1 % IJ SOLN: 5.0000 mL | INTRAMUSCULAR | Status: DC | PRN

## 2020-11-20 MED ORDER — SENNOSIDES 8.8 MG/5ML PO SYRP
10.0000 mL | ORAL_SOLUTION | Freq: Every day | ORAL | Status: DC | PRN
  Filled 2020-11-20: qty 10

## 2020-11-20 MED ORDER — ENSURE ENLIVE PO LIQD
237.0000 mL | Freq: Two times a day (BID) | ORAL | Status: DC
  Administered 2020-11-20 (×2): 237 mL

## 2020-11-20 MED ORDER — MELATONIN 5 MG PO TABS
5.0000 mg | ORAL_TABLET | Freq: Every day | ORAL | Status: DC
  Administered 2020-11-20: 22:00:00 5 mg via ORAL
  Filled 2020-11-20: qty 1

## 2020-11-20 MED ORDER — DEXTROSE 50 % IV SOLN
25.0000 mL | Freq: Once | INTRAVENOUS | Status: AC
  Administered 2020-11-20: 09:00:00 25 mL via INTRAVENOUS

## 2020-11-20 MED ORDER — JUVEN PO PACK
1.0000 | PACK | Freq: Two times a day (BID) | ORAL | Status: DC
  Administered 2020-11-20 – 2020-11-24 (×9): 1
  Filled 2020-11-20 (×9): qty 1

## 2020-11-20 MED ORDER — IOHEXOL 300 MG/ML  SOLN
100.0000 mL | Freq: Once | INTRAMUSCULAR | Status: AC | PRN
  Administered 2020-11-20: 14:00:00 60 mL via INTRAVENOUS

## 2020-11-20 MED ORDER — SODIUM ZIRCONIUM CYCLOSILICATE 10 G PO PACK
10.0000 g | PACK | Freq: Two times a day (BID) | ORAL | Status: DC
  Administered 2020-11-20 – 2020-11-23 (×8): 10 g via ORAL
  Filled 2020-11-20 (×10): qty 1

## 2020-11-20 MED ORDER — LIDOCAINE HCL (PF) 1 % IJ SOLN
INTRAMUSCULAR | Status: AC | PRN
  Administered 2020-11-20: 5 mL

## 2020-11-20 MED ORDER — VITAL HIGH PROTEIN PO LIQD
1000.0000 mL | ORAL | Status: DC
  Administered 2020-11-20: 16:00:00 1000 mL
  Filled 2020-11-20: qty 1000

## 2020-11-20 MED ORDER — HEPARIN SODIUM (PORCINE) 1000 UNIT/ML DIALYSIS
1000.0000 [IU] | INTRAMUSCULAR | Status: DC | PRN
  Filled 2020-11-20: qty 1

## 2020-11-20 MED ORDER — DEXTROSE 50 % IV SOLN
INTRAVENOUS | Status: AC
  Filled 2020-11-20: qty 50

## 2020-11-20 MED ORDER — ONDANSETRON HCL 4 MG/2ML IJ SOLN
INTRAMUSCULAR | Status: AC
  Filled 2020-11-20: qty 2

## 2020-11-20 MED ORDER — QUETIAPINE FUMARATE 100 MG PO TABS
100.0000 mg | ORAL_TABLET | Freq: Every day | ORAL | Status: DC
  Administered 2020-11-20 – 2020-11-23 (×4): 100 mg
  Filled 2020-11-20: qty 4
  Filled 2020-11-20 (×3): qty 1

## 2020-11-20 MED ORDER — LIDOCAINE HCL 1 % IJ SOLN
INTRAMUSCULAR | Status: AC
  Filled 2020-11-20: qty 20

## 2020-11-20 MED ORDER — LIDOCAINE-PRILOCAINE 2.5-2.5 % EX CREA
1.0000 | TOPICAL_CREAM | CUTANEOUS | Status: DC | PRN
Start: 2020-11-20 — End: 2020-11-21
  Filled 2020-11-20: qty 5

## 2020-11-20 MED ORDER — NEPRO/CARBSTEADY PO LIQD
237.0000 mL | Freq: Two times a day (BID) | ORAL | Status: DC
  Administered 2020-11-21 – 2020-11-24 (×7): 237 mL via ORAL

## 2020-11-20 MED ORDER — IOHEXOL 300 MG/ML  SOLN
100.0000 mL | Freq: Once | INTRAMUSCULAR | Status: AC | PRN
  Administered 2020-11-20: 14:00:00 30 mL via INTRAVENOUS

## 2020-11-20 MED ORDER — AMIODARONE HCL 200 MG PO TABS
200.0000 mg | ORAL_TABLET | Freq: Two times a day (BID) | ORAL | Status: DC
  Administered 2020-11-20 – 2020-11-24 (×9): 200 mg
  Filled 2020-11-20 (×9): qty 1

## 2020-11-20 MED ORDER — POLYETHYLENE GLYCOL 3350 17 G PO PACK
17.0000 g | PACK | Freq: Every day | ORAL | Status: DC
  Administered 2020-11-20 – 2020-11-24 (×4): 17 g
  Filled 2020-11-20 (×5): qty 1

## 2020-11-20 MED ORDER — CLONAZEPAM 0.5 MG PO TBDP
0.5000 mg | ORAL_TABLET | Freq: Two times a day (BID) | ORAL | Status: DC | PRN
  Filled 2020-11-20: qty 2

## 2020-11-20 MED ORDER — NEPRO/CARBSTEADY PO LIQD
480.0000 mL | ORAL | Status: DC
  Administered 2020-11-20: 18:00:00 480 mL
  Filled 2020-11-20 (×2): qty 711

## 2020-11-20 MED ORDER — PENTAFLUOROPROP-TETRAFLUOROETH EX AERO: 1.0000 "application " | INHALATION_SPRAY | CUTANEOUS | Status: DC | PRN

## 2020-11-20 MED ORDER — HYDROMORPHONE HCL 1 MG/ML IJ SOLN
0.5000 mg | INTRAMUSCULAR | Status: DC | PRN
  Administered 2020-11-20 – 2020-11-21 (×3): 0.5 mg via INTRAVENOUS
  Filled 2020-11-20 (×3): qty 1

## 2020-11-20 MED ORDER — QUETIAPINE FUMARATE 50 MG PO TABS: 50.0000 mg | ORAL_TABLET | Freq: Every day | ORAL | Status: DC

## 2020-11-20 NOTE — Progress Notes (Signed)
Hypoglycemic Event  CBG: 67  Treatment: 8 oz Juice Symptoms: None  Follow-up CBG: Time: 0800 CBG Result: 66  Possible Reasons for Event: Poor PO intake   Hypoglycemic Event  CBG: 66  Treatment: Dextrose  Symptoms: None  Follow-up CBG: Time:0920 CBG Result: 103  Possible Reasons for Event: Poor PO intake Comments/MD notified: Tube feedings    [place]

## 2020-11-20 NOTE — Procedures (Signed)
Pleural Fibrinolytic Administration Procedure Note  ARUSH GATLIFF  [id number]  05-08-1961  Date:11/20/20  Time:2:38 AM   Provider Performing:Khyson Sebesta W Heber Dearing   Procedure: Pleural Fibrinolysis Subsequent day [phone])  Indication(s) Fibrinolysis of complicated pleural effusion  Consent Risks of the procedure as well as the alternatives and risks of each were explained to the patient and/or caregiver.  Consent for the procedure was obtained.  Anesthesia None  Time Out Verified patient identification, verified procedure, site/side was marked, verified correct patient position, special equipment/implants available, medications/allergies/relevant history reviewed, required imaging and test results available.  Sterile Technique Hand hygiene, gloves, chlorhexidine scrub of side port.   Procedure Description Existing pleural catheter was cleaned and accessed in sterile manner.  10mg  of tPA in 30cc of saline and 5mg  of dornase in 30cc of sterile water were injected into pleural space using existing pleural catheter. Catheter flushed with 10cc normal saline. Catheter will be clamped for 1 hour and then placed back to suction.   Complications/Tolerance None; patient tolerated the procedure well.  EBL None   Specimen(s) None   Georgann Housekeeper, AGACNP-BC Seth Ward for personal pager PCCM on call pager [phone]  11/20/2020 2:40 AM

## 2020-11-20 NOTE — Sedation Documentation (Signed)
Patient here for fistulogram. Attached to monitor. Injection only to be performed at this time. Awaiting instructions per intervention purposes

## 2020-11-20 NOTE — Progress Notes (Signed)
Progress Note  Patient Name: Travis [name] Date of Encounter: 11/20/2020  [facility] Cardiologist: Glenetta Hew, MD   Subjective   No complaints this morning. Wants to get up out of bed, wash his face. Family at the bedside.   Inpatient Medications    Scheduled Meds: . sodium chloride   Intravenous Once  . alteplase (TPA) for intrapleural administration  10 mg Intrapleural Q12H   And  . pulmozyme (DORNASE) for intrapleural administration  5 mg Intrapleural Q12H  . amiodarone  200 mg Per Tube Daily  . atorvastatin  80 mg Per Tube q1800  . carvedilol  3.125 mg Per Tube BID WC  . chlorhexidine  15 mL Mouth Rinse BID  . Chlorhexidine Gluconate Cloth  6 each Topical Q0600  . collagenase   Topical Daily  . doxercalciferol  5 mcg Intravenous Q M,W,F  . feeding supplement  237 mL Oral BID BM  . guaiFENesin  15 mL Per Tube Q4H  . insulin aspart  0-6 Units Subcutaneous Q4H  . lactobacillus  1 g Per Tube TID WC  . mouth rinse  15 mL Mouth Rinse q12n4p  . midodrine  5 mg Per Tube TID WC  . multivitamin  1 tablet Per Tube QHS  . nutrition supplement (JUVEN)  1 packet Oral BID BM  . pantoprazole sodium  40 mg Per Tube BID   Followed by  . [START ON 12/18/2020] pantoprazole sodium  40 mg Per Tube Daily  . QUEtiapine  25 mg Per Tube QHS  . sevelamer carbonate  1.6 g Per Tube TID WC  . sodium chloride flush  10 mL Intracatheter Q8H  . sodium chloride flush  10-40 mL Intracatheter Q12H  . sodium chloride flush  3 mL Intravenous Q12H  . sucralfate  1 g Per Tube QID  . ticagrelor  90 mg Per Tube BID   Continuous Infusions: . sodium chloride Stopped (11/10/20 1944)  . sodium chloride Stopped (10/28/20 0954)  . sodium chloride Stopped (11/04/20 0016)  . sodium chloride    . sodium chloride    .  ceFAZolin (ANCEF) IV 2 g (11/17/20 1240)   PRN Meds: sodium chloride, sodium chloride, sodium chloride, acetaminophen, alteplase, clonazepam, heparin, heparin, lidocaine (PF),  lidocaine-prilocaine, ondansetron (ZOFRAN) IV, oxyCODONE, pentafluoroprop-tetrafluoroeth, Resource ThickenUp Clear, sodium chloride, sodium chloride flush, sodium chloride flush   Vital Signs    Vitals:   11/19/20 1642 11/19/20 1800 11/19/20 2122 11/20/20 0100  BP: 101/60 (!) 101/58  107/79  Pulse: 97 95  98  Resp:  18 20 20   Temp:  99 F (37.2 C) (!) 100.5 F (38.1 C) 99.5 F (37.5 C)  TempSrc:  Oral Oral Oral  SpO2:  96%  97%  Weight:      Height:        Intake/Output Summary (Last 24 hours) at 11/20/2020 0741 Last data filed at 11/19/2020 1900 Gross per 24 hour  Intake 640 ml  Output 2220 ml  Net -1580 ml   Last 3 Weights 11/18/2020 11/17/2020 11/17/2020  Weight (lbs) 182 lb 15.7 oz 171 lb 11.8 oz 181 lb 10.5 oz  Weight (kg) 83 kg 77.9 kg 82.4 kg      Telemetry    SR-->ST - Personally Reviewed  ECG    No new tracing  Physical Exam  Thin AA male, laying in bed GEN: No acute distress.   Neck: No JVD Cardiac: RRR, no murmurs, rubs, or gallops.  Respiratory: Course breath sounds, diminished in bases  more so on the right GI: Soft, nontender, non-distended. Cortrack in place MS: No edema; No deformity. Neuro:  Nonfocal  Psych: Normal affect   Labs    High Sensitivity Troponin:   Recent Labs  Lab 10/28/20 0629 10/28/20 1046 10/28/20 2040 10/29/20 2121 10/30/20 0548  TROPONINIHS 3,155* >27,000* >27,000* >27,000* >27,000*      Chemistry Recent Labs  Lab 11/16/20 1628 11/17/20 0556 11/17/20 2324 11/18/20 0312 11/19/20 0658  NA  --    < > 136 137 132*  K  --    < > 4.0 4.4 5.6*  CL  --    < > 97* 97* 93*  CO2  --    < > 27 26 24   GLUCOSE  --    < > 104* 115* 85  BUN  --    < > 66* 71* 98*  CREATININE  --    < > 5.95* 6.42* 8.84*  CALCIUM  --    < > 9.2 9.3 9.5  PROT 5.4*  --   --   --   --   ALBUMIN  --    < > 1.4* 1.3* 1.3*  GFRNONAA  --    < > 10* 9* 6*  ANIONGAP  --    < > 12 14 15    < > = values in this interval not displayed.      Hematology Recent Labs  Lab 11/17/20 2324 11/18/20 0312 11/19/20 0658  WBC 7.6 8.6 14.0*  RBC 2.91* 2.89* 3.10*  HGB 8.8* 8.6* 9.0*  HCT 25.7* 25.7* 27.9*  MCV 88.3 88.9 90.0  MCH 30.2 29.8 29.0  MCHC 34.2 33.5 32.3  RDW 15.9* 15.9* 15.2  PLT 200 224 298    BNPNo results for input(s): BNP, PROBNP in the last 168 hours.   DDimer No results for input(s): DDIMER in the last 168 hours.   Radiology    CT CHEST WO CONTRAST  Result Date: 11/18/2020 CLINICAL DATA:  59 year old male with history of empyema. EXAM: CT CHEST WITHOUT CONTRAST TECHNIQUE: Multidetector CT imaging of the chest was performed following the standard protocol without IV contrast. COMPARISON:  Chest CT 10/31/2020. FINDINGS: Comment: Today's study is limited by lack of IV contrast for detection and characterization of visceral and/or vascular lesions. Cardiovascular: Heart size is mildly enlarged. There is no significant pericardial fluid, thickening or pericardial calcification. There is aortic atherosclerosis, as well as atherosclerosis of the great vessels of the mediastinum and the coronary arteries, including calcified atherosclerotic plaque in the left main, left anterior descending, left circumflex and right coronary arteries. Moderate calcifications of the aortic valve and mitral annulus. Right internal jugular central venous catheter with tip terminating in the right atrium. Mediastinum/Nodes: No pathologically enlarged mediastinal or hilar lymph nodes. Please note that accurate exclusion of hilar adenopathy is limited on noncontrast CT scans. Nasogastric tube extending into the stomach. Esophagus is otherwise unremarkable in appearance. No axillary lymphadenopathy. Lungs/Pleura: Much of the previously noted patchy areas of airspace consolidation have regressed compared to the prior study, although several of these areas are now far more nodular and mass-like in appearance. The largest residual area of consolidation  is in the lateral segment of the right middle lobe (axial image 113 of series 3) where there is a 4.4 x 2.5 cm area of consolidation which is relatively low attenuation (23 HU) and appears to have some convex margins, likely to reflect an area of developing pulmonary necrosis and abscess. Several other thick-walled cavities are noted (  example on axial image 49 of series 4 in the periphery of the left upper lobe), which likely represents smaller resolving abscesses. Extensive volume loss is also noted, with associated thickening of the peribronchovascular interstitium and regional areas of architectural distortion, most evident in the lower lobes of the lungs bilaterally. Small left and small to moderate right pleural effusions are present, very complex in appearance with extensive pleural thickening and multiple loculated fluid collections. The largest of these loculated fluid collections is in the left hemithorax is located anteriorly adjacent to the left upper lobe (axial image 53 of series 3) measuring 3.7 x 2.2 cm. Small bore chest tube in the posterior aspect of the right hemithorax appears appropriately located. Upper Abdomen: Aortic atherosclerosis. Musculoskeletal: There are no aggressive appearing lytic or blastic lesions noted in the visualized portions of the skeleton. IMPRESSION: 1. Resolving multilobar bilateral pneumonia with evidence of widespread pulmonary necrosis and multiple pulmonary abscesses, as well as bilateral empyemas, as detailed above. 2. Aortic atherosclerosis, in addition to left main and 3 vessel coronary artery disease. Please note that although the presence of coronary artery calcium documents the presence of coronary artery disease, the severity of this disease and any potential stenosis cannot be assessed on this non-gated CT examination. Assessment for potential risk factor modification, dietary therapy or pharmacologic therapy may be warranted, if clinically indicated. 3. Mild  cardiomegaly. Aortic Atherosclerosis (ICD10-I70.0). Electronically Signed   By: Vinnie Langton M.D.   On: 11/18/2020 10:40   DG CHEST PORT 1 VIEW  Result Date: 11/20/2020 CLINICAL DATA:  Hypoxia. EXAM: PORTABLE CHEST 1 VIEW COMPARISON:  11/19/2020 FINDINGS: 0625 hours. The cardio pericardial silhouette is enlarged. There is pulmonary vascular congestion without overt pulmonary edema. Interval improvement in aeration at the right base with persistent right base collapse/consolidative opacity and pleural fluid. The right pleural drain seen previously has migrated inferiorly and intrathoracic positioning cannot be confirmed. A feeding tube passes into the stomach although the distal tip position is not included on the film. Left subclavian central line is positioned at the innominate vein confluence. Right IJ central line tip overlies the mid to lower right atrium. IMPRESSION: 1. Cardiomegaly with vascular congestion. Interval improvement in aeration at the right base with persistent right base collapse/consolidative opacity and right pleural effusion. 2. Interval migration of right pleural drain, now potentially extra thoracic given the low position. Electronically Signed   By: Misty Stanley M.D.   On: 11/20/2020 06:36   DG Chest Port 1 View  Result Date: 11/19/2020 CLINICAL DATA:  Chest tube. EXAM: PORTABLE CHEST 1 VIEW COMPARISON:  November 18, 2020. FINDINGS: Stable cardiomegaly. Feeding tube is seen entering the stomach. Right internal jugular catheter is unchanged. Right-sided chest tube is unchanged without pneumothorax. Stable bibasilar atelectasis or infiltrates are noted, with probable small bilateral pleural effusions. Bony thorax is unremarkable. IMPRESSION: Stable support apparatus. Stable bibasilar atelectasis or infiltrates, with probable small bilateral pleural effusions. No pneumothorax is noted. Electronically Signed   By: Marijo Conception M.D.   On: 11/19/2020 10:56    Cardiac Studies    Cath: 10/28/20    There is mild to moderate left ventricular systolic dysfunction. The left ventricular ejection fraction is 40-45% by visual estimate - > anterior-anterolateral apical hypokinesis  LV end diastolic pressure is severely elevated -> on recheck post PCI, EDP 30 mmHg  Mid LAD lesion is 99% stenosed. 1st Diag lesion is 55% stenosed just proximal to the lesion  A drug-eluting stent was successfully placed crossing  the diagonal branch, using a STENT RESOLUTE ONYX [id number]. Postdilated to 3.1 mm  Post intervention, there is a 0% residual stenosis.  LPAV lesion is 99% stenosed -> ostial lesion (LCx is 90  turn followed by another 90  turn into the AVG)  Prox RCA lesion is 80% stenosed -> concentric napkin ring calcified lesion.   SUMMARY  Acute anterolateral ST elevation MI with Culprit lesion being the 99% mid LAD lesion (just past major 1st Diag/D1) along with multivessel CAD including 99% ostial AV groove LCx and 80% calcified napkin ring proximal RCA lesion with extensive calcification throughout the RCA. ? Successful PTCA and DES PCI of the LAD crossing D1 -resolute Onyx DES 2.75 mm x 18 mm postdilated to 3.1 mm. ? With PCI, there was stabilization of significant ectopy, AIVR and PVCs  Mild to moderate reduced EF with anterior anterolateral hypokinesis, EF roughly 40 to 45%.  Initial evaluation suggested normal EDP, but on recheck post PCI LVEDP of 30 mmHg, severely elevated consistent with ACUTE DIASTOLIC HEART FAILURE  Significantly dilated aortic root requiring AL-1 guide catheter for both Left and Right Coronary Angiography.   RECOMMENDATIONS  Admit to CCU, restart IV heparin 8 hours after sheath removal  Check 2D echo for better assessment of EF  Plan to review cath films with other IC cardiologist, anticipate staged PCI of at least the RCA which may require atherectomy versus lithotripsy, and likely the ostial AV groove circumflex.  If he has signs of  dyspnea, consider earlier dialysis than tomorrow. ->  Would consult for nephrology to see today.  He is already on labetalol and Norvasc.  We will continue current home medications.  Uninterrupted DAPT times X 1 year.  Glenetta Hew, M.D., M.S. Interventional Cardiologist   Diagnostic Dominance: Right    Intervention    Echo: 11/13/20  IMPRESSIONS    1. Left ventricular ejection fraction, by estimation, is 45 to 50%. The  left ventricle has mildly decreased function. The left ventricle  demonstrates regional wall motion abnormalities (see scoring  diagram/findings for description). There is severe  concentric left ventricular hypertrophy. Left ventricular diastolic  parameters are consistent with Grade I diastolic dysfunction (impaired  relaxation).  2. Right ventricular systolic function is normal. The right ventricular  size is normal. There is normal pulmonary artery systolic pressure. The  estimated right ventricular systolic pressure is 37.1 mmHg.  3. The mitral valve is degenerative. Mild mitral valve regurgitation. No  evidence of mitral stenosis.  4. The aortic valve is tricuspid. There is mild calcification of the  aortic valve. There is mild thickening of the aortic valve. Aortic valve  regurgitation is mild. Mild aortic valve sclerosis is present, with no  evidence of aortic valve stenosis.  5. The inferior vena cava is normal in size with greater than 50%  respiratory variability, suggesting right atrial pressure of 3 mmHg.   Comparison(s): Changes from prior study are noted. EF has improved to  45-50%. WMAs remain the mid septum into apex. RVSP has improved.   Patient Profile     59 y.o. male with ESRD on HD, HTN, tobacco abuse, CAD  presenting with anterior STEMI 10/28/20 s/p placement of a stent in the LAD with residual disease in the RCA and Circumflex. He developed atrial fib with RVR. Hospital course complicated by MSSA bacteremia with shock,  upper GI bleeding secondary to severe esophagitis and duodenal ulcer, ICU delirium, metabolic encephalopathy, acute hypoxemic respiratory failure, anemia.   Assessment & Plan  1. Anterior STEMI: culprit lesion noted in the mLAD 99% treated with DESx1. Initially placed on DAPT with ASA/Brilinta with plans to consider for staged intervention to the RCA. Now on monotherapy with Brilinta given significant anemia throughout admission. Was briefly on IV cangrelor but back to Brilinta. Plans for staged intervention to the RCA on hold with his multiple co-morbidities.  -- continue on Brilinta and statin  2. CAD: as above recent LAD stenting with residual disease in the RCA being treated medically at this time. Did have brief PEA arrest this admission  3. Septic Shock: 2/2 MSSA bacteremia. Total of 6 weeks of IV antibiotics per ID. Still treated with midodrine.  4. PAF: developed rapid rates on 11/21 and 11/23 requiring cardioversion on each occasion. Now on oral amiodarone 200mg  daily. Not considered a candidate for [facility] with anemia and GI bleed -- given the need for midodrine, will stop Coreg and increase amiodarone to 200mg  BID  5. Ischemic cardiomyopathy: EF noted at 45-50%. Remains euvolemic on exam.   6. Anemia: s/p transfusion. Seen by GI  7. Moderate to Severe MR: repeat echo 12/6 showed mild MR instead of moderate to severe  8. Loculated Pleural effusion: right sided effusion. Had a chest tube in placed on 12/9, dislodged this morning. Now with dressing in place.   9. ESRD on HD: followed by Nephrology  10. Pressure sacral wound: management per primary   For questions or updates, please contact [place] Please consult [email or web address] for contact info under        Signed, Reino Bellis, NP  11/20/2020, 7:41 AM

## 2020-11-20 NOTE — Sedation Documentation (Signed)
Patient 85% on room air. 2 liter via [place] attached. Sats back in the 90s

## 2020-11-20 NOTE — Progress Notes (Signed)
Physical Therapy Treatment Patient Details Name: Travis [name] MRN: [id number] DOB: 1961-10-19 Today's Date: 11/20/2020    History of Present Illness Pt is 59 y.o. male with past medical history of end-stage renal disease on hemodialysis, essential hypertension, dilated ascending aorta and hyperlipidemia who presented on November 20 with anterior ST elevation myocardial infarction.  VDRF 11/23-12/3.  Stent placed 11/20.  Afib with RVR with cardioversion 11/21. Suspected cholecystolithiasis 11/29.    PT Comments    Pt making excellent progress today with goals met and updated.  He was able to participate with multiple sit to stands and even take a few steps at the EOB with mod A and facilitation techniques.  Pt was impulsive and with decreased awareness of his deficits - recommend assist of 2 to progress. Required increased time for rest breaks and due to confusion and having to convince pt to return to bed for HD.   Follow Up Recommendations  CIR     Equipment Recommendations  Wheelchair (measurements PT);Wheelchair cushion (measurements PT);3in1 (PT)    Recommendations for Other Services       Precautions / Restrictions Precautions Precautions: Fall    Mobility  Bed Mobility Overal bed mobility: Needs Assistance Bed Mobility: Rolling;Supine to Sit;Sit to Supine Rolling: Supervision   Supine to sit: Mod assist   Sit to sidelying: Total assist General bed mobility comments: Supine to sit: pt able to assist with legs to EOB and the pulled up on therapist hand with mod A for trunk.  For sit to supine required total assist mainly due to not following commands, trying to walk to bathroom, and sliding at EOB so was total assisted back to bed.  Transfers Overall transfer level: Needs assistance Equipment used: Rolling walker (2 wheeled);1 person hand held assist Transfers: Sit to/from Stand Sit to Stand: Mod assist;Max assist;+2 safety/equipment (Family member present and  assisted with equipment and guarding when pt impulsive)         General transfer comment: First sit to stand with RW and Max A with knee buckling.  Then performed 3 x sit to stand mod A with therapist in front of pt, blocking knees, gait belt, and facilitation at buttock and shoulder for posture.  Was going to return pt to bed but he kept inisiting on trying to walk to bathroom and impulsively stood 2 x with mod A therapist at side and wife at other side; therapist again provided facilitation for posture and pt able to stand upright.  Pt insisting on trying with RW.  Tried again with RW and pt stood x 2 with light mod A and faciliatation for posture.  Ambulation/Gait Ambulation/Gait assistance: Mod assist Gait Distance (Feet): 2 Feet Assistive device: Rolling walker (2 wheeled) Gait Pattern/deviations: Step-to pattern;Decreased stride length;Shuffle Gait velocity: decreased   General Gait Details: Pt took 4 steps in place then knees buckling returned to supine.  On 2nd attempt pt was able to take side steps toward [facility].  Cued/faciliated posture, balance, and RW use.   Stairs             Wheelchair Mobility    Modified Rankin (Stroke Patients Only)       Balance Overall balance assessment: Needs assistance Sitting-balance support: Feet supported;Bilateral upper extremity supported;No upper extremity supported Sitting balance-Leahy Scale: Fair Sitting balance - Comments: Pt sitting EOB for at least 30 mins.  Able to maintain balance with close supervision/min guard.  Mostly with support of UE or leaning over on his knees but did  maintain upright for short time without UE support   Standing balance support: Bilateral upper extremity supported Standing balance-Leahy Scale: Poor Standing balance comment: Requiring use of UE on RW or therapist.  Pt stood multiple times throughout session for ~20 seconds each.  Started with hunched posture but with later reps was standing upright                             Cognition Arousal/Alertness: Awake/alert Behavior During Therapy: Impulsive Overall Cognitive Status: Impaired/Different from baseline Area of Impairment: Memory;Following commands;Safety/judgement;Awareness;Problem solving;Orientation;Attention                 Orientation Level: Disoriented to;Time;Situation;Place Current Attention Level: Sustained Memory: Decreased short-term memory Following Commands: Follows one step commands inconsistently Safety/Judgement: Decreased awareness of safety;Decreased awareness of deficits Awareness: Intellectual Problem Solving: Difficulty sequencing;Requires verbal cues;Requires tactile cues;Decreased initiation;Slow processing General Comments: Pt impulsive and with decreased awareness of deficits.  He reports wanting to walk to the bathroom and required assist and repeated education that he is not able to do this.      Exercises General Exercises - Lower Extremity Ankle Circles/Pumps: AROM;Both;10 reps;Seated Long Arc Quad: AROM;10 reps;Seated    General Comments General comments (skin integrity, edema, etc.): Pt had [place] in place but on 0 LPM with sats 95% or greater.  After therapy, pt returned to sidelying and immediately fell asleep with sats dropping to 87%.  Family reports sats decrease with sleep.  Placed on 2 LPM with sats up to 90%.  Notified RN.      Pertinent Vitals/Pain Pain Assessment: No/denies pain    Home Living                      Prior Function            PT Goals (current goals can now be found in the care plan section) Acute Rehab PT Goals Patient Stated Goal: Walk to the bathroom PT Goal Formulation: With patient/family Time For Goal Achievement: 12/04/20 Potential to Achieve Goals: Good Progress towards PT goals: Goals met and updated - see care plan    Frequency    Min 3X/week      PT Plan Current plan remains appropriate    Co-evaluation               AM-PAC PT "6 Clicks" Mobility   Outcome Measure  Help needed turning from your back to your side while in a flat bed without using bedrails?: None Help needed moving from lying on your back to sitting on the side of a flat bed without using bedrails?: A Lot Help needed moving to and from a bed to a chair (including a wheelchair)?: A Lot Help needed standing up from a chair using your arms (e.g., wheelchair or bedside chair)?: A Lot Help needed to walk in hospital room?: A Lot Help needed climbing 3-5 steps with a railing? : Total 6 Click Score: 13    End of Session Equipment Utilized During Treatment: Oxygen;Gait belt Activity Tolerance: Patient tolerated treatment well Patient left: in bed;with call bell/phone within reach;with bed alarm set;with family/visitor present Nurse Communication: Mobility status;Other (comment) (use of STEDY, request for geo-mat for pressure relief -secretary ordering) PT Visit Diagnosis: Muscle weakness (generalized) (M62.81)     Time: 0865-7846 PT Time Calculation (min) (ACUTE ONLY): 55 min  Charges:  $Gait Training: 8-22 mins $Therapeutic Activity: 38-52 mins  Abran Richard, PT Acute Rehab Services Pager [phone] [facility] 11/20/2020, 1:53 PM

## 2020-11-20 NOTE — Sedation Documentation (Addendum)
Per Dr. Earleen Newport, intervention necessary at this time to treat 2 stenotic areas however due to patients current status, no sedation to be given. Monitor only case at this time.

## 2020-11-20 NOTE — Progress Notes (Signed)
[facility] Progress Note Patient Name: Travis [name] DOB: 20-Jul-1961 MRN: [id number]   Date of Service  11/20/2020  HPI/Events of Note  Review of CXR: Interval migration of right pleural drain, now potentially extra thoracic given the low position.  eICU Interventions  Night APP notified to pass on to day team and he tells me that he has removed the chest tube.      Intervention Category Major Interventions: Other:  [name] 11/20/2020, 6:43 AM

## 2020-11-20 NOTE — Plan of Care (Signed)
  Problem: Education: Goal: Understanding of CV disease, CV risk reduction, and recovery process will improve Outcome: Progressing   Problem: Cardiovascular: Goal: Ability to achieve and maintain adequate cardiovascular perfusion will improve Outcome: Progressing   Problem: Education: Goal: Knowledge of General Education information will improve Description: Including pain rating scale, medication(s)/side effects and non-pharmacologic comfort measures Outcome: Progressing   Problem: Pain Managment: Goal: General experience of comfort will improve Outcome: Progressing

## 2020-11-20 NOTE — Progress Notes (Signed)
Nutrition Follow-up  DOCUMENTATION CODES:   Severe malnutrition in context of chronic illness  INTERVENTION:   D/c Ensure Enlive due to electrolyte disturbances   Nepro Shake po BID, each supplement provides 425 kcal and 19 grams protein  Magic cup po BID with meals, each supplement provides 290 kcal and 9 grams of protein  Continue Juven BID per tube for wound healing  Continue renal MVI daily  Continue feeding assistance and encouragement at meals times  Transition trickle TF via Cortrak to Nepro @ 40ml/hr to provide pt with an additional 864 kcals and 38 grams of protein   NUTRITION DIAGNOSIS:   Severe Malnutrition related to chronic illness as evidenced by severe muscle depletion,severe fat depletion.  ongoing  GOAL:   Patient will meet greater than or equal to 90% of their needs  progressing  MONITOR:   PO intake,Supplement acceptance,TF tolerance,Labs,Weight trends,Skin  REASON FOR ASSESSMENT:   Ventilator    ASSESSMENT:   Patient with PMH significant for HTN and ESRD in HD. Presents this admission with STEMI.   11/20 PCI to LAD 11/23Intubated 11/24 OG tube in esophagus, unable to advance 11/26 Cortrak attempted but unable to advance past 55 cm, likely at GE junction. DR advanced to duodenal bulb; TF initiated 11/30 Brown malodorous output from mouth and OG, TF held, CT negative for obstruction, ileus with no BM x 10 days. Abd xray with OG tube GE junction and Cortrak now retraced back into stomach; both needing advancement 12/01 iHD not tolerated, CRRT to resume, +large amount of liquid stool post enema, Attempted Cortrak advancement butunsuccessful 12/2 pt with large volume blood from OG tube; s/p EGD with noted severe esophagitis and erosions/ulcerations in duodenal bulb compatible with ischemia (probable ischemic duodenitis)  12/3 extubated; attempted cortrak placement unsuccessful 12/9 Chest tube inserted, SLP advanced diet to Dys 3/Nectar  thick 12/11 Pleural Fibrinolysis Initial day  12/12 Pleural Fibrinolysis Subsequent day 12/13 Pleural Fibrinolysis Subsequent day  Pt's chest tube was inadvertently dislodged by pt today. Per CCM, new airleak present and chest tube was removed. Pt being observed to see if new chest tube is needed.   48-Hour Calorie Count was conducted over the weekend. The results are as follows:  Diet: dysphagia 1 with thin liquids Supplements: magic cup po TID  Day 1 results (12/11): Breakfast: 587 kcals, 29 grams protein Lunch: no meal documentation available  Dinner: 502 kcals, 26 grams protein Supplements: Pt appears to have refused supplements on the first day or not been offered supplements  Total intake: 1089 kcal (48% of minimum estimated needs)  55 grams protein (40% of minimum estimated needs)  Day 2 Results (12/12): Breakfast: 578 kcals, 16 grams protein Lunch: 519 kcals, 32 grams protein Dinner: 200 kcals, 0 grams protein Supplements: 290 kcal, 9 grams protein  Total intake: 1587 kcal (70% of minimum estimated needs)  57 grams protein (42% of minimum estimated needs)   MD placed consult today for pt to receive trickle TF via cortrak to reduce hypoglycemic events. Pt now with orders for Vital High Protein @ 67ml/hr. This provides 480 kcals and 42 grams protein. Pt also receiving Ensure Enlive BID and Juven BID per tube. Will d/c Ensure Enlive and transition pt to Nepro shakes given elevated electrolytes. Will also transition pt's trickle TF to Nepro given electrolyte disturbances.   Chest tube: 2231ml output x24 hours No UOP documented.   Last HD 12/10, Net UF 3L   Admit wt: 86.4 kg Current wt: 83 kg  Labs: Na  130 (L), K+ 5.9 (H), Phosphorus 7.1 (H), CBGs 66-103-70 Medications: hectorol, ss novolog Q4H, floranex TID w/ meals, rena-vit, miralax, renvela, lokelma, carafate   Diet Order:   Diet Order            DIET - DYS 1 Room service appropriate? No; Fluid consistency:  Thin  Diet effective now                 EDUCATION NEEDS:   No education needs have been identified at this time  Skin:  Skin Assessment: (P) Skin Integrity Issues: (skin tear vs pressure injury to sacrum noted today, not yet documented by RN) Skin Integrity Issues:: Stage IV Stage II: n/a Stage IV: buttocks  Last BM:  12/20 black stool via rectal tube  Height:   Ht Readings from Last 1 Encounters:  10/28/20 5\' 11"  (1.803 m)    Weight:   Wt Readings from Last 1 Encounters:  11/18/20 83 kg    BMI:  Body mass index is 25.52 kg/m.  Estimated Nutritional Needs:   Kcal:  2250-2500  Protein:  135-160 g  Fluid:  1000 mL plus UOP    Larkin Ina, MS, RD, LDN RD pager number and weekend/on-call pager number located in [place].

## 2020-11-20 NOTE — Progress Notes (Signed)
NAME:  Travis [name], MRN:  [id number], DOB:  01/19/1961, LOS: 23 ADMISSION DATE:  10/28/2020, CONSULTATION DATE:  10/29/20 REFERRING MD:  Cardiology - [name], CHIEF COMPLAINT:  Hypotension, gram positive cocci on culture, AMS  Brief History   Travis [name] is a 59 year old man with a history of HTN, ESRD on HD MWF, RUE AV fistula, hx of smoking, admitted 10/28/20 with fevers, myalgias for several days, acute chest pain.    History of present illness   On Admission, found to have STEMI, multivessel disease. Pulm edema with mild hypoxemia.  S/Post PCI to the LAD on 10/29/19. Planning for staged intervention to the left circumflex and possibly right coronary artery.  EF 57-84%, diastolic dysfunction, LVEDP 35. Underwent HD on 11/20 with 4 L volume removed.  Saturation improved after dialysis (100% on RA) Fevers noted 11/20. Blood cultures grew MSSA, noted this evening. .    This evening developed Afib with RVR 180s. Adenosine given, without improvement  Amiodarone infusion started per cardiology, 150mg  bolus, then drip.  Lopressor 5mg IV and 500ccNS given. Started on Phenylephrine, was on 274mcg on my arrival.   Cardioverted emergently at 120J for ongoing afib with hypotension (60/40) and AMS.  Converted to sinus.  Remained moderately hypotensive (MAP 60) on phenylephrine.   Patient was given one dose zosyn, switched to ancef once cultures grew back MSSA.   Cardiac stress tests annually, last done 11/16/19.    Past Medical History  HTN ESRD, HD MWF  Significant Hospital Events   Cardioversion 11/21 Cardiac cath stent to LAD 11/20  Consults:  PCCM  Nephrology ID  Procedures:  Central line 11/21 >> 11/29 Arterial line 11/21 out Repeat DCCV 11/23 11/23 ETT out HD line 11/24 Arterial line 11/24 out [place] CVC 11/30>>   Significant Diagnostic Tests:  11/24 TEE>>Left ventricular ejection fraction, by estimation, is 35 to 40%. The  left ventricle has moderately decreased  function.Moderate to severe mitral regurgitation. 11/23 CT Chest>>Extensive multifocal nodular and patchy airspace disease in both lungs with a slight peripheral predominance in the upper lungs. 3.1 cm nodular consolidative opacity in the right upper lobe is cavitated. Imaging features likely related to multifocal pneumonia. Septic emboli and metastatic disease considered less likely but not excluded. 11/22 LUE Vas Upper Extremity Doppler>> Arteriovenous fistula-Aneurysmal dilatation noted. 11/24 CT abdomen - no retroperitoneal hematoma 11/29 MR Brain>> No evidence acute intracranial abnormality, sinisitis, mastoid effusions 11/29>> MR Cervical Spine>> Given the provided history of bacteremia, facet joint septic arthritis is difficult to definitively exclude, but the lack of any surrounding marrow edema argues against this Cervical spondylosis  11/29 MR Thoracic Spine No significant marrow edema in thoracic spine , Mild thoracic spondylosis hypointense marrow signal throughout the thoracic spine, likely related to the patient's end-stage renal disease. multifocal airspace disease and cavitary pulmonary lesions. Small right pleural effusion. 11/29 MR Lumbar Spine:As noted above and Suspected cholecystolithiasis 12/11 CT chest - improving air space disease - R empyema persists but adequate chest tube position.  Micro Data:  11/21 BCx2>>Staph aureus > MSSA by BCID 11/24 BCx2>> 11/30 BC x 2>> staph epi>> MRSA 11/09/2020 blood cultures x2 no growth negative Antimicrobials:  Zosyn 11/21 x 1 Ancef 11/21 ->  Interim history/subjective:  Had a rough night, not sleeping well, required ativan to help sleep. Chest tube also accidentally dislodged now out. This am some persistent pain at sacral pressure ulcer site, unchanged. Wife at bedside.  Objective   Blood pressure 126/78, pulse (!) 102, temperature 99.4  F (37.4 C), temperature source Oral, resp. rate 20, height 5\' 11"  (1.803 m), weight 83  kg, SpO2 98 %.        Intake/Output Summary (Last 24 hours) at 11/20/2020 0908 Last data filed at 11/19/2020 2200 Gross per 24 hour  Intake 100 ml  Output 920 ml  Net -820 ml   Filed Weights   11/17/20 0500 11/17/20 1850 11/18/20 0450  Weight: 82.4 kg 77.9 kg 83 kg   Constitutional: frail elderly man in NAD  Eyes: EOMI, pupils equal Ears, nose, mouth, and throat: tempoeral wasting, trachea midline Cardiovascular: Slightly tachycardic, ext warm Respiratory: diminished at bases, slightly tachypneic Gastrointestinal: Soft, +BS, rectal tube and cortrak in place Skin: No rashes, normal turgor, see nursing documentation regarding unstagable sacral pressure ulcer Neurologic: globally weak, moves all 4 ext Psychiatric: RASS -1, answering questions  Chemistries worse, due to HD WBC up a bit Hgb stable CXR worsening R effusion  Resolved Hospital Problem list     Assessment & Plan:    - Anterior STEMI s/p DES 11/20 trying to get by with brillinta alone - Ischemic cardiomyopathy 40-45% - Afib previously on [facility] held for ongoing bleeding - Recent GIB with recent EGD showing severe esophagitis and duodenal ulceration question ischemic change - MSSA bacteremia thought related to HD catheter on prolonged abx therapy -  loculated R parapneumonic effusion - Unstagable sacral ulcer pressure injury still awaiting PT eval for hydrotherapy - ESRD on HD - Dysphagia -Generalized deconditioning - Hypoglycemia  - Add trickle tube feeds to supplement PO and reduce hypoglycemic events - Increase qHS seroquel, add melatonin, encourage day/night cycles - Abx as ordered for now - iHD per nephrology - Watch effusion with CXR, if worsening need to place back pigtail - Will follow with you, remains tenuous from respiratory standpoint  Erskine Emery MD PCCM

## 2020-11-20 NOTE — Procedures (Signed)
Interventional Radiology Procedure Note  Procedure: US guided access LUE fistula. Fistulagram.  Treatment of 2 tandem outflow stenosis, 46mm, 56mm, 62mm.  80% stenosis >90% stenosis  Post: 80% stenosis --> 0% stenosis residual. >90% stenosis --> 0%stenosis residual.   Complications: None  Recommendations:  - OK to cannulate - Recommend duplex for evaluation of flow volume and to eval the A-V anastamosis - Routine care   Signed,  Dulcy Fanny. Earleen Newport, DO

## 2020-11-20 NOTE — Progress Notes (Signed)
Pt transported to HD for treatment.

## 2020-11-20 NOTE — Progress Notes (Signed)
Travis [name] NEPHROLOGY PROGRESS NOTE  Assessment/ Plan: Pt is a 59 y.o. yo male HTN, ESRD on HD admitted on 11/20 with fever, myalgia found to have NSTEMI, complicated by A. fib with RVR, bacteremia, GI bleed and VDRF.   Physical Exam: General: in bed, Travis, has NG tube. Heart:RRR, s1s2 nl Lungs: Basal rhonchi, no wheezing  Abdomen:soft, Non-tender, non-distended Extremities:No edema Dialysis Access: Has TDC now,  AV fistula no bruit   OP HD:AF MWF 4h 450/800 81.5kg 2/2.25 bath RUE AVF Hep 5000+ 1000 mid-run - hect 5 ug tiw  - mircera 50 ugIVq 4 weeks, last 11/15 (due 11/29)  Assessment/ Plan:  1. ESRD - usual HD MWF.  Required CRRT on and off from 11/24-12/4.  Tolerating iHD. Plan for AV fistula declot Monday by IR. Unable to use heparin because of GI bleed/anemia. HD today.  2. BP/volume - 2kg over today, no edema on exam, BP's wnl 3. Acute STEMI- S/P LAD PCI11/20, has otherdisease LCx/RCA.Transitioned to brilinta from cangrelor. Per cardiology. 4. MSSA cavitary PNA / bacteremia- sp vent support. Blood cx+. Getting 6 wks IV Ancef per pharm ending 12/30/20. F/U bcx's negative. [name] of AVF neg for abscess. 5. Pleural effusion: Chest tube placed , per CCM.  6. PEA arrest: occurred in setting of agitation/ sedation.Brief CPR. 7. Shock - resolved, now on midodrine. 8. Sacral decub - stage IV, mod size, getting local WC 9. Anemia ckd/ABLA -off of anticoagulation.  Received blood transfusion, seen by GI, no plan for endoscopy. 10. Secondary hyperparathyroidism: Increased sevelamer.  Monitor phosphorus level. 11. Afib:on amio and hep gtt, s/p DCCV. 12. Severe MR:per cardiology.  Subjective: Seen in room, wound team doing lavage to sacral decubitus.   Objective Vital signs in last 24 hours: Vitals:   11/20/20 0100 11/20/20 0757 11/20/20 1400 11/20/20 1511  BP: 107/79 126/78 118/80 94/64  Pulse: 98 (!) 102  100  Resp: 20 20 (!) 22 20  Temp:  99.5 F (37.5 C) 99.4 F (37.4 C)    TempSrc: Oral Oral    SpO2: 97% 98% 98% 93%  Weight:      Height:       Weight change:   Intake/Output Summary (Last 24 hours) at 11/20/2020 1559 Last data filed at 11/19/2020 2200 Gross per 24 hour  Intake 100 ml  Output 920 ml  Net -820 ml       Labs: Basic Metabolic Panel: Recent Labs  Lab 11/18/20 0312 11/19/20 0658 11/20/20 0500  NA 137 132* 130*  K 4.4 5.6* 5.9*  CL 97* 93* 90*  CO2 26 24 23   GLUCOSE 115* 85 86  BUN 71* 98* 121*  CREATININE 6.42* 8.84* 10.61*  CALCIUM 9.3 9.5 9.5  PHOS 5.6* 6.2* 7.1*   Liver Function Tests: Recent Labs  Lab 11/16/20 1628 11/17/20 0556 11/18/20 0312 11/19/20 0658 11/20/20 0500  PROT 5.4*  --   --   --   --   ALBUMIN  --    < > 1.3* 1.3* 1.3*   < > = values in this interval not displayed.   No results for input(s): LIPASE, AMYLASE in the last 168 hours. No results for input(s): AMMONIA in the last 168 hours. CBC: Recent Labs  Lab 11/16/20 0538 11/16/20 1510 11/17/20 0556 11/17/20 2324 11/18/20 0312 11/19/20 0658  WBC 5.9  --  6.9 7.6 8.6 14.0*  NEUTROABS  --   --  5.3  --   --   --   HGB 7.4*   < >  8.8* 8.8* 8.6* 9.0*  HCT 23.3*   < > 27.5* 25.7* 25.7* 27.9*  MCV 91.4  --  91.7 88.3 88.9 90.0  PLT 165  --  175 200 224 298   < > = values in this interval not displayed.   Cardiac Enzymes: No results for input(s): CKTOTAL, CKMB, CKMBINDEX, TROPONINI in the last 168 hours. CBG: Recent Labs  Lab 11/19/20 2056 11/20/20 0751 11/20/20 0843 11/20/20 0920 11/20/20 1112  GLUCAP 94 67* 66* 103* 70    Medications: Infusions: . sodium chloride Stopped (11/10/20 1944)  . sodium chloride Stopped (10/28/20 0954)  .  ceFAZolin (ANCEF) IV 2 g (11/17/20 1240)    Scheduled Medications: . alteplase (TPA) for intrapleural administration  10 mg Intrapleural Q12H   And  . pulmozyme (DORNASE) for intrapleural administration  5 mg Intrapleural Q12H  . amiodarone  200 mg Per  Tube BID  . atorvastatin  80 mg Per Tube q1800  . chlorhexidine  15 mL Mouth Rinse BID  . Chlorhexidine Gluconate Cloth  6 each Topical Q0600  . collagenase   Topical Daily  . doxercalciferol  5 mcg Intravenous Q M,W,F  . feeding supplement  237 mL Per Tube BID BM  . feeding supplement (VITAL HIGH PROTEIN)  1,000 mL Per Tube Q24H  . guaiFENesin  15 mL Per Tube Q4H  . insulin aspart  0-6 Units Subcutaneous Q4H  . lactobacillus  1 g Per Tube TID WC  . mouth rinse  15 mL Mouth Rinse q12n4p  . melatonin  5 mg Oral QHS  . midodrine  5 mg Per Tube TID WC  . multivitamin  1 tablet Per Tube QHS  . nutrition supplement (JUVEN)  1 packet Per Tube BID BM  . pantoprazole sodium  40 mg Per Tube BID   Followed by  . [START ON 12/18/2020] pantoprazole sodium  40 mg Per Tube Daily  . polyethylene glycol  17 g Per Tube Daily  . QUEtiapine  100 mg Per Tube QHS  . sevelamer carbonate  1.6 g Per Tube TID WC  . sodium chloride flush  10 mL Intracatheter Q8H  . sodium chloride flush  10-40 mL Intracatheter Q12H  . sodium chloride flush  3 mL Intravenous Q12H  . sodium zirconium cyclosilicate  10 g Oral BID  . sucralfate  1 g Per Tube QID  . ticagrelor  90 mg Per Tube BID    have reviewed scheduled and prn medications.

## 2020-11-20 NOTE — Sedation Documentation (Signed)
Report given to floor RN. No sedation given. See physician notes for procedure outcome

## 2020-11-20 NOTE — Progress Notes (Signed)
PCCM INTERVAL PROGRESS NOTE  Called to bedside after chest tube inadvertently dislodged by patient. CXR being done as I arrived and pigtail appeared to be outside the pleural space. On evaluation of the patient, new airleak present. Tube clearly dislodged. Removed by me. Dressing placed. Will have to observe for re-accumulation and possibly place new tube.     Georgann Housekeeper, AGACNP-BC [place]  See [place] for personal pager PCCM on call pager [phone]  11/20/2020 7:07 AM

## 2020-11-20 NOTE — Progress Notes (Signed)
Report given to HD nurse for pts HD tx.

## 2020-11-20 NOTE — Progress Notes (Addendum)
eLink Physician-Brief Progress Note Patient Name: Travis [name] DOB: 10/25/1961 MRN: [id number]   Date of Service  11/20/2020  HPI/Events of Note  Hypoxia - Patient turned over and pulled on Chest tube. Chesst tube now with air leak. Sat = 98%.  eICU Interventions  Plan: 1. Portable CXR STAT.      Intervention Category Major Interventions: Hypoxemia - evaluation and management  Ahsha Hinsley [place] 11/20/2020, 5:57 AM

## 2020-11-20 NOTE — Progress Notes (Addendum)
PROGRESS NOTE        PATIENT DETAILS Name: Travis [name] Age: 59 y.o. Sex: male Date of Birth: 16-Apr-1961 Admit Date: 10/28/2020 Admitting Physician Leonie Man, MD VVO:[id number], Marjory Lies, MD  Brief Narrative: Patient is a 59 y.o. male with history of ESRD, HTN, CAD-who presented with anterior STEMI-treated with DES to LAD-hospital course prolonged and complicated by atrial fibrillation with RVR requiring cardioversion x2,, septic shock due to MSSA bacteremia, brief PEA arrest requiring CPR, acute hypoxic respiratory failure requiring intubation, upper GI bleed with acute blood loss anemia due to severe esophagitis requiring PRBC transfusion, severe metabolic encephalopathy/ICU delirium, bilateral lung abscesses with empyema requiring chest tube insertion--stabilized in the ICU-subsequently transferred to [facility] on 12/12.  Significant events: 11/20>> cardiac cath with PCI to LAD 11/21>> A. fib with RVR with hypotension-cardioversion by cardiology 11/23>> A. fib with RVR with hypotension-cardioversion by cardiology. 11/23>> worsening respiratory distress-intubated 11/24>> CODE BLUE-asystole-ROSC after 4 minutes of CPR. 12/9>> chest tube placed. 12/12>> transferred to [facility] 12/13>> chest tube accidentally dislodged-and came out  Significant studies: 11/23>>TTE: EF 35-40%, regional wall motion abnormalities, moderate to severe MR  11/23 CT Chest>> multifocal pneumonia-septic emboli/metastatic disease less likely.   11/24>> TEE: EF 35-40%, severe MR, AR with echodensities that could be consistent with calcification. 11/22 LUE Vas Upper Extremity Doppler>> Arteriovenous fistula-Aneurysmal dilatation noted. 11/24 CT abdomen - no retroperitoneal hematoma 11/27>> ultrasound left upper extremity: No abscess or mass identified. 11/29 MR Brain>> No evidence acute intracranial abnormality 11/29>> MR Cervical Spine: Trace facet effusions, minimal  edema along C6, cervical spondylosis  11/29 MR Thoracic Spine: Mild edema along T6, no significant marrow edema 11/29 MR Lumbar Spine: Trace L4-L5 facet effusions with surrounding marrow edema  11/30>> CT abdomen/pelvis: No cause identified for nausea/vomiting, mild dorsal pancreatic duct dilatation, 12/6>> Limited echo: EF 45-50% 12/8>> CT abdomen/pelvis: no retroperitoneal bleeding, anasarca, bilateral lower lobe lung consolidation 12/11>> CT chest: Resolving multi lobar pneumonia with evidence of widespread pulmonary necrosis/multiple pulmonary abscesses, bilateral empyemas.  Antimicrobial therapy: Ancef: 11/21>>  Microbiology data: 11/21>> blood culture: MSSA 11/22>> blood culture: No growth 11/23>> blood culture: No growth 11/23>> tracheal aspirate: MSSA 11/24>> blood culture: No growth 11/30>> blood culture: Staph epidermidis 12/2>> blood culture: No growth 12/9>> right pleural fluid culture: No growth  Procedures : 12/9>> insertion of right chest tube by PCCM 12/2>>EGD: Moderately severe reflux and erosive esophagitis with no active bleeding ETT: 11/23>> 12/3 Central line 11/21 >> 11/29  Consults: PCCM, ID, GI, nephrology, cardiology,IR  DVT Prophylaxis : SCD's  Subjective: Had delirium last night-this morning Sleepy but following commands.  Spouse at bedside.  Continues to have pain at the sacral decubitus ulcer site.  Spouse worried about no significant BM since yesterday.  Assessment/Plan: Acute STEMI-s/p DES to LAD on 11/20: On Brilinta, statin-no obvious chest discomfort today-cardiology plans to pursue eventual PCI to LCx, RCA when GI bleeding and other issues are more stable.  PAF with RVR associated with hypotension requiring DC cardioversion x2: Maintaining sinus rhythm-not a candidate for anticoagulation due to GI bleeding and severity of anemia.  Continue amiodarone and low-dose beta-blocker.  Brief PEA arrest: Continue telemetry monitoring-work-up as  above-cardiology following.  Chronic systolic heart failure/ischemic cardiomyopathy: Euvolemic-diuresis with HD.  Tolerating low-dose beta-blocker well.  Moderate to severe MR: Stable for outpatient follow-up with cardiology.  Septic shock due to MSSA  bacteremia: Sepsis physiology has resolved-BP stable-repeat cultures as above-on IV Ancef with end date of 12/19/2020.  ID has signed off.  Necrotizing lung infection/lung abscess with empyema: Chest tube accidentally dislodged-PCCM plans to follow-remains off chest tube for now-follow closely.  Remains on IV Ancef.  Upper GI bleed with acute blood loss anemia superimposed on anemia of critical illness: Brown stools in rectal tube-hemoglobin stabilizing-continue PPI/Carafate.  EGD as above.  Follow CBC closely.  ESRD: On HD-nephrology following.  Hyperkalemia: On Lokelma-for HD today-repeat electrolytes tomorrow.  Dysphagia: Due to severe generalized weakness/deconditioning-NG tube in place-started on diet (dysphagia 1)-SLP following-once diet stable-we can consider discontinuing NG tube.  Per spouse-tolerating oral intake.  Delirium/acute metabolic encephalopathy: Continues to have episodes of confusion-mostly at night per spouse-we will increase Seroquel to 50 mg nightly, continue as needed Klonopin.  Watch closely.  Debility/deconditioning: Secondary to acute illness-eventually will require CIR when closer to discharge.  Constipation: Had some abdominal pain yesterday-no BM-belly soft-no vomiting-start MiraLAX and follow.  17 mm left adrenal nodule: Incidental finding-stable for outpatient follow-up.  Unstageable pressure injury/Stage 4 full-thickness injury-to bilateral buttocks due to combination of pressure and moisture: Wound care care following-PT following for hydrotherapy  Pressure Ulcer: Pressure Injury 11/12/20 Buttocks Bilateral;Medial Stage 4 - Full thickness tissue loss with exposed bone, tendon or muscle. (Active)  11/12/20  0800  Location: Buttocks  Location Orientation: Bilateral;Medial  Staging: Stage 4 - Full thickness tissue loss with exposed bone, tendon or muscle.  Wound Description (Comments):   Present on Admission: No     Pressure Injury 11/14/20 Buttocks Mid;Right;Left Unstageable - Full thickness tissue loss in which the base of the injury is covered by slough (yellow, tan, gray, green or brown) and/or eschar (tan, brown or black) in the wound bed. (Active)  11/14/20 1128  Location: Buttocks  Location Orientation: Mid;Right;Left  Staging: Unstageable - Full thickness tissue loss in which the base of the injury is covered by slough (yellow, tan, gray, green or brown) and/or eschar (tan, brown or black) in the wound bed.  Wound Description (Comments):   Present on Admission: No   Nutrition Problem: Nutrition Problem: Severe Malnutrition Etiology: chronic illness Signs/Symptoms: severe muscle depletion,severe fat depletion Interventions: Ensure Enlive (each supplement provides 350kcal and 20 grams of protein),MVI,Tube feeding,Magic cup   Diet: Diet Order            DIET - DYS 1 Room service appropriate? No; Fluid consistency: Thin  Diet effective now                  Code Status: Full code   Family Communication: Spouse at bedside  Disposition Plan: Status is: Inpatient  Remains inpatient appropriate because:Inpatient level of care appropriate due to severity of illness   Dispo: The patient is from: Home              Anticipated d/c is to: CIR              Anticipated d/c date is: > 3 days              Patient currently is not medically stable to d/c.   Barriers to Discharge: Continue close observation to see if patient develops further GI bleeding-remains on IV Ancef for necrotizing pneumonia/empyema/MSSA bacteremia.  Other multiple medical issues as outlined above  Antimicrobial agents: Anti-infectives (From admission, onward)   Start     Dose/Rate Route Frequency Ordered  Stop   11/13/20 1200  ceFAZolin (ANCEF) IVPB 2g/100 mL premix  2 g 200 mL/hr over 30 Minutes Intravenous Every M-W-F (Hemodialysis) 11/12/20 1502 12/19/20 2359   11/09/20 1800  ceFAZolin (ANCEF) IVPB 1 g/50 mL premix  Status:  Discontinued        1 g 100 mL/hr over 30 Minutes Intravenous Every 24 hours 11/08/20 1218 11/08/20 1500   11/08/20 2200  ceFAZolin (ANCEF) IVPB 2g/100 mL premix  Status:  Discontinued        2 g 200 mL/hr over 30 Minutes Intravenous Every 12 hours 11/08/20 1500 11/12/20 1502   11/08/20 1000  ceFAZolin (ANCEF) IVPB 1 g/50 mL premix  Status:  Discontinued        1 g 100 mL/hr over 30 Minutes Intravenous Every 24 hours 11/07/20 1439 11/08/20 1218   11/08/20 0415  vancomycin (VANCOREADY) IVPB 1500 mg/300 mL        1,500 mg 150 mL/hr over 120 Minutes Intravenous  Once 11/08/20 0328 11/08/20 0640   11/02/20 2000  ceFAZolin (ANCEF) IVPB 2g/100 mL premix  Status:  Discontinued        2 g 200 mL/hr over 30 Minutes Intravenous Every 12 hours 11/02/20 0742 11/07/20 1439   11/01/20 2200  ceFAZolin (ANCEF) IVPB 2g/100 mL premix  Status:  Discontinued        2 g 200 mL/hr over 30 Minutes Intravenous Every 12 hours 11/01/20 1007 11/01/20 1017   11/01/20 1100  ceFAZolin (ANCEF) IVPB 2g/100 mL premix  Status:  Discontinued        2 g 200 mL/hr over 30 Minutes Intravenous Every 12 hours 11/01/20 1017 11/02/20 0742   10/30/20 0030  ceFAZolin (ANCEF) IVPB 1 g/50 mL premix  Status:  Discontinued        1 g 100 mL/hr over 30 Minutes Intravenous Daily at bedtime 10/29/20 2236 11/01/20 1007   10/29/20 2315  ceFAZolin (ANCEF) IVPB 1 g/50 mL premix  Status:  Discontinued        1 g 100 mL/hr over 30 Minutes Intravenous Daily at bedtime 10/29/20 2217 10/29/20 2236   10/29/20 2215  vancomycin (VANCOREADY) IVPB 1750 mg/350 mL  Status:  Discontinued        1,750 mg 175 mL/hr over 120 Minutes Intravenous  Once 10/29/20 2116 10/29/20 2211   10/29/20 2215  piperacillin-tazobactam  (ZOSYN) IVPB 2.25 g  Status:  Discontinued        2.25 g 100 mL/hr over 30 Minutes Intravenous Every 8 hours 10/29/20 2116 10/29/20 2217   10/29/20 2118  vancomycin variable dose per unstable renal function (pharmacist dosing)  Status:  Discontinued         Does not apply See admin instructions 10/29/20 2118 10/29/20 2211       Time spent: 35 minutes-Greater than 50% of this time was spent in counseling, explanation of diagnosis, planning of further management, and coordination of care.  MEDICATIONS: Scheduled Meds: . sodium chloride   Intravenous Once  . alteplase (TPA) for intrapleural administration  10 mg Intrapleural Q12H   And  . pulmozyme (DORNASE) for intrapleural administration  5 mg Intrapleural Q12H  . amiodarone  200 mg Per Tube BID  . atorvastatin  80 mg Per Tube q1800  . chlorhexidine  15 mL Mouth Rinse BID  . Chlorhexidine Gluconate Cloth  6 each Topical Q0600  . collagenase   Topical Daily  . doxercalciferol  5 mcg Intravenous Q M,W,F  . feeding supplement  237 mL Per Tube BID BM  . feeding supplement (VITAL HIGH PROTEIN)  1,000 mL Per Tube  Q24H  . guaiFENesin  15 mL Per Tube Q4H  . insulin aspart  0-6 Units Subcutaneous Q4H  . lactobacillus  1 g Per Tube TID WC  . lidocaine      . mouth rinse  15 mL Mouth Rinse q12n4p  . melatonin  5 mg Oral QHS  . midodrine  5 mg Per Tube TID WC  . multivitamin  1 tablet Per Tube QHS  . nutrition supplement (JUVEN)  1 packet Per Tube BID BM  . pantoprazole sodium  40 mg Per Tube BID   Followed by  . [START ON 12/18/2020] pantoprazole sodium  40 mg Per Tube Daily  . polyethylene glycol  17 g Per Tube Daily  . QUEtiapine  100 mg Per Tube QHS  . sevelamer carbonate  1.6 g Per Tube TID WC  . sodium chloride flush  10 mL Intracatheter Q8H  . sodium chloride flush  10-40 mL Intracatheter Q12H  . sodium chloride flush  3 mL Intravenous Q12H  . sucralfate  1 g Per Tube QID  . ticagrelor  90 mg Per Tube BID   Continuous  Infusions: . sodium chloride Stopped (11/10/20 1944)  . sodium chloride Stopped (10/28/20 0954)  . sodium chloride Stopped (11/04/20 0016)  . sodium chloride    . sodium chloride    .  ceFAZolin (ANCEF) IV 2 g (11/17/20 1240)   PRN Meds:.sodium chloride, sodium chloride, sodium chloride, acetaminophen, alteplase, clonazepam, heparin, heparin, iohexol, lidocaine (PF), lidocaine-prilocaine, ondansetron (ZOFRAN) IV, oxyCODONE, pentafluoroprop-tetrafluoroeth, Resource ThickenUp Clear, sennosides, sodium chloride, sodium chloride flush, sodium chloride flush   PHYSICAL EXAM: Vital signs: Vitals:   11/19/20 1800 11/19/20 2122 11/20/20 0100 11/20/20 0757  BP: (!) 101/58  107/79 126/78  Pulse: 95  98 (!) 102  Resp: 18 20 20 20   Temp: 99 F (37.2 C) (!) 100.5 F (38.1 C) 99.5 F (37.5 C) 99.4 F (37.4 C)  TempSrc: Oral Oral Oral Oral  SpO2: 96%  97% 98%  Weight:      Height:       Filed Weights   11/17/20 0500 11/17/20 1850 11/18/20 0450  Weight: 82.4 kg 77.9 kg 83 kg   Body mass index is 25.52 kg/m.   Gen Exam: Sleepy but awake this morning-chronically sick appearing. HEENT:atraumatic, normocephalic Chest: B/L clear to auscultation anteriorly CVS:S1S2 regular Abdomen:soft non tender, non distended Extremities:no edema Neurology: Generalized weakness-but moving all 4 extremities Skin: no rash  I have personally reviewed following labs and imaging studies  LABORATORY DATA: CBC: Recent Labs  Lab 11/16/20 0538 11/16/20 1510 11/16/20 1628 11/17/20 0556 11/17/20 2324 11/18/20 0312 11/19/20 0658  WBC 5.9  --   --  6.9 7.6 8.6 14.0*  NEUTROABS  --   --   --  5.3  --   --   --   HGB 7.4* 8.5*  --  8.8* 8.8* 8.6* 9.0*  HCT 23.3* 25.0* 27.2* 27.5* 25.7* 25.7* 27.9*  MCV 91.4  --   --  91.7 88.3 88.9 90.0  PLT 165  --   --  175 200 224 563    Basic Metabolic Panel: Recent Labs  Lab 11/16/20 0538 11/16/20 1510 11/17/20 0556 11/17/20 2324 11/18/20 0312 11/19/20 0658  11/20/20 0500  NA 133*   < > 135 136 137 132* 130*  K 3.9   < > 4.7 4.0 4.4 5.6* 5.9*  CL 97*  --  97* 97* 97* 93* 90*  CO2 24  --  22 27 26  24  23  GLUCOSE 121*  --  91 104* 115* 85 86  BUN 79*  --  108* 66* 71* 98* 121*  CREATININE 6.91*  --  8.63* 5.95* 6.42* 8.84* 10.61*  CALCIUM 9.1  --  9.2 9.2 9.3 9.5 9.5  MG 2.0  --  2.0  --  2.0 1.8 2.0  PHOS 5.4*  --  6.9* 4.9* 5.6* 6.2* 7.1*   < > = values in this interval not displayed.    GFR: Estimated Creatinine Clearance: 8.1 mL/min (A) (by C-G formula based on SCr of 10.61 mg/dL (H)).  Liver Function Tests: Recent Labs  Lab 11/16/20 1628 11/17/20 0556 11/17/20 2324 11/18/20 0312 11/19/20 0658 11/20/20 0500  PROT 5.4*  --   --   --   --   --   ALBUMIN  --  1.3* 1.4* 1.3* 1.3* 1.3*   No results for input(s): LIPASE, AMYLASE in the last 168 hours. No results for input(s): AMMONIA in the last 168 hours.  Coagulation Profile: No results for input(s): INR, PROTIME in the last 168 hours.  Cardiac Enzymes: No results for input(s): CKTOTAL, CKMB, CKMBINDEX, TROPONINI in the last 168 hours.  BNP (last 3 results) No results for input(s): PROBNP in the last 8760 hours.  Lipid Profile: No results for input(s): CHOL, HDL, LDLCALC, TRIG, CHOLHDL, LDLDIRECT in the last 72 hours.  Thyroid Function Tests: No results for input(s): TSH, T4TOTAL, FREET4, T3FREE, THYROIDAB in the last 72 hours.  Anemia Panel: No results for input(s): VITAMINB12, FOLATE, FERRITIN, TIBC, IRON, RETICCTPCT in the last 72 hours.  Urine analysis:    Component Value Date/Time   COLORURINE YELLOW 11/13/2012 1335   APPEARANCEUR CLEAR 11/13/2012 1335   LABSPEC 1.015 11/13/2012 1335   PHURINE 5.5 11/13/2012 1335   GLUCOSEU NEGATIVE 11/13/2012 1335   HGBUR SMALL (A) 11/13/2012 1335   BILIRUBINUR NEGATIVE 11/13/2012 1335   KETONESUR NEGATIVE 11/13/2012 1335   PROTEINUR >300 (A) 11/13/2012 1335   UROBILINOGEN 0.2 11/13/2012 1335   NITRITE NEGATIVE  11/13/2012 1335   LEUKOCYTESUR NEGATIVE 11/13/2012 1335    Sepsis Labs: Lactic Acid, Venous    Component Value Date/Time   LATICACIDVEN 0.5 11/08/2020 0348    MICROBIOLOGY: Recent Results (from the past 240 hour(s))  Body fluid culture     Status: None   Collection Time: 11/16/20  4:28 PM   Specimen: Pleura; Body Fluid  Result Value Ref Range Status   Specimen Description PLEURAL  Final   Special Requests Pleural R  Final   Gram Stain   Final    RARE WBC PRESENT, PREDOMINANTLY MONONUCLEAR NO ORGANISMS SEEN    Culture   Final    NO GROWTH 3 DAYS Performed at [facility], [address] [place], [place] [address]    Report Status 11/20/2020 FINAL  Final    RADIOLOGY STUDIES/RESULTS: DG CHEST PORT 1 VIEW  Result Date: 11/20/2020 CLINICAL DATA:  Hypoxia. EXAM: PORTABLE CHEST 1 VIEW COMPARISON:  11/19/2020 FINDINGS: 0625 hours. The cardio pericardial silhouette is enlarged. There is pulmonary vascular congestion without overt pulmonary edema. Interval improvement in aeration at the right base with persistent right base collapse/consolidative opacity and pleural fluid. The right pleural drain seen previously has migrated inferiorly and intrathoracic positioning cannot be confirmed. A feeding tube passes into the stomach although the distal tip position is not included on the film. Left subclavian central line is positioned at the innominate vein confluence. Right IJ central line tip overlies the mid to lower right  atrium. IMPRESSION: 1. Cardiomegaly with vascular congestion. Interval improvement in aeration at the right base with persistent right base collapse/consolidative opacity and right pleural effusion. 2. Interval migration of right pleural drain, now potentially extra thoracic given the low position. Electronically Signed   By: Misty Stanley M.D.   On: 11/20/2020 06:36   DG Chest Port 1 View  Result Date: 11/19/2020 CLINICAL DATA:  Chest tube. EXAM: PORTABLE CHEST 1  VIEW COMPARISON:  November 18, 2020. FINDINGS: Stable cardiomegaly. Feeding tube is seen entering the stomach. Right internal jugular catheter is unchanged. Right-sided chest tube is unchanged without pneumothorax. Stable bibasilar atelectasis or infiltrates are noted, with probable small bilateral pleural effusions. Bony thorax is unremarkable. IMPRESSION: Stable support apparatus. Stable bibasilar atelectasis or infiltrates, with probable small bilateral pleural effusions. No pneumothorax is noted. Electronically Signed   By: Marijo Conception M.D.   On: 11/19/2020 10:56     LOS: 23 days   Oren Binet, MD  [facility]    To contact the attending provider between 7A-7P or the covering provider during after hours 7P-7A, please log into the web site [email or web address] and access using universal [place] password for that web site. If you do not have the password, please call the hospital operator.  11/20/2020, 1:37 PM

## 2020-11-20 NOTE — Progress Notes (Signed)
[facility] Gastroenterology Progress Note  Travis [name] 59 y.o. 02-10-1961   Subjective: Somnolent. Wife in room.  Objective: Vital signs: Vitals:   11/20/20 0757 11/20/20 1400  BP: 126/78 118/80  Pulse: (!) 102   Resp: 20 (!) 22  Temp: 99.4 F (37.4 C)   SpO2: 98% 98%    Physical Exam: Gen: somnolent, no acute distress  CV: RRR Chest: CTA B Abd: soft, nontender, nondistended, +BS; PEG tube Ext: no edema  Lab Results: Recent Labs    11/19/20 0658 11/20/20 0500  NA 132* 130*  K 5.6* 5.9*  CL 93* 90*  CO2 24 23  GLUCOSE 85 86  BUN 98* 121*  CREATININE 8.84* 10.61*  CALCIUM 9.5 9.5  MG 1.8 2.0  PHOS 6.2* 7.1*   Recent Labs    11/19/20 0658 11/20/20 0500  ALBUMIN 1.3* 1.3*   Recent Labs    11/18/20 0312 11/19/20 0658  WBC 8.6 14.0*  HGB 8.6* 9.0*  HCT 25.7* 27.9*  MCV 88.9 90.0  PLT 224 298      Assessment/Plan: Duodenal ulcers and esophagitis - continue medical management with Protonix per G-tube. Hgb stable at 9.0. No further GI recs. Will sign off. Call if questions.   Lear Ng 11/20/2020, 2:53 PM  Questions please call [phone] ID: Travis [name], male   DOB: 05-Mar-1961, 59 y.o.   MRN: [id number]

## 2020-11-20 NOTE — Progress Notes (Signed)
Physical Therapy Wound Treatment Patient Details  Name: Travis [name] MRN: [id number] Date of Birth: [birth year]  Today's Date: 11/20/2020 Time: 0905-1000 Time Calculation (min): 55 min  Subjective  Subjective: Lethargic/sleeping and nonsensical speech Patient and Family Stated Goals: None stated Prior Treatments: Dressing changes  Pain Score:    Wound Assessment  Pressure Injury 11/14/20 Buttocks Mid;Right;Left Unstageable - Full thickness tissue loss in which the base of the injury is covered by slough (yellow, tan, gray, green or brown) and/or eschar (tan, brown or black) in the wound bed. (Active)  Dressing Type ABD;Barrier Film (skin prep);Gauze (Comment);Moist to dry 11/20/20 1125  Dressing Changed;Clean;Dry;Intact 11/20/20 1125  Dressing Change Frequency Daily 11/20/20 1125  State of Healing Eschar 11/20/20 1125  Site / Wound Assessment Pink;Yellow;Brown 11/20/20 1125  % Wound base Red or Granulating 50% 11/20/20 1125  % Wound base Yellow/Fibrinous Exudate 40% 11/20/20 1125  % Wound base Black/Eschar 10% 11/20/20 1125  % Wound base Other/Granulation Tissue (Comment) 0% 11/20/20 1125  Peri-wound Assessment Intact 11/20/20 1125  Wound Length (cm) 11 cm 11/14/20 1100  Wound Width (cm) 17 cm 11/14/20 1100  Wound Depth (cm) 0.1 cm 11/14/20 1100  Wound Surface Area (cm^2) 187 cm^2 11/14/20 1100  Wound Volume (cm^3) 18.7 cm^3 11/14/20 1100  Margins Unattached edges (unapproximated) 11/20/20 1125  Drainage Amount Moderate 11/20/20 1125  Drainage Description Serosanguineous 11/20/20 1125  Treatment Debridement (Selective);Hydrotherapy (Pulse lavage);Packing (Saline gauze) 11/20/20 1125   Santyl applied to wound bed prior to applying dressing.     Hydrotherapy Pulsed lavage therapy - wound location: Buttocks Pulsed Lavage with Suction (psi): 12 psi Pulsed Lavage with Suction - Normal Saline Used: 1000 mL Pulsed Lavage Tip: Tip with splash shield Selective  Debridement Selective Debridement - Location: Buttocks Selective Debridement - Tools Used: Forceps;Scissors Selective Debridement - Tissue Removed: Eschar and yellow slough   Wound Assessment and Plan  Wound Therapy - Assess/Plan/Recommendations Wound Therapy - Clinical Statement: Noted pads and pillows with drainage from chest tube site and RN present to change that dressing during session. Buttock/sacral wound progressing with debridement of eschar and slough. Wife present at beginning of session and questions answered within scope of practice. Discussed recommendation of air mattress with RN - bed present in hallway by end of session. This patient will benefit from continued hydrotherapy for selective removal of unviable tissue, to decrease bioburden, and promote wound bed healing. Wound Therapy - Functional Problem List: Global weakness Factors Delaying/Impairing Wound Healing: Immobility;Multiple medical problems Hydrotherapy Plan: Debridement;Dressing change;Patient/family education;Pulsatile lavage with suction Wound Therapy - Frequency: 6X / week Wound Therapy - Follow Up Recommendations: Skilled nursing facility Wound Plan: See above  Wound Therapy Goals- Improve the function of patient's integumentary system by progressing the wound(s) through the phases of wound healing (inflammation - proliferation - remodeling) by: Decrease Necrotic Tissue to: 20% Decrease Necrotic Tissue - Progress: Progressing toward goal Increase Granulation Tissue to: 80% Increase Granulation Tissue - Progress: Progressing toward goal Goals/treatment plan/discharge plan were made with and agreed upon by patient/family: Yes Time For Goal Achievement: 7 days Wound Therapy - Potential for Goals: Good  Goals will be updated until maximal potential achieved or discharge criteria met.  Discharge criteria: when goals achieved, discharge from hospital, MD decision/surgical intervention, no progress towards goals,  refusal/missing three consecutive treatments without notification or medical reason.  GP     Thelma Comp 11/20/2020, 11:31 AM   Rolinda Roan, PT, DPT Acute Rehabilitation Services Pager: [phone] Office: [phone]

## 2020-11-21 ENCOUNTER — Encounter (HOSPITAL_COMMUNITY)
Admission: EM | Disposition: A | Discharge status: Home Health | Payer: Self-pay | Source: Home / Self Care | Attending: Cardiology

## 2020-11-21 ENCOUNTER — Inpatient Hospital Stay (HOSPITAL_COMMUNITY): Payer: Medicare Other

## 2020-11-21 DIAGNOSIS — J869 Pyothorax without fistula: Secondary | ICD-10-CM

## 2020-11-21 LAB — RENAL FUNCTION PANEL
Albumin: 1.2 g/dL — ABNORMAL LOW (ref 3.5–5.0)
Anion gap: 10 (ref 5–15)
BUN: 64 mg/dL — ABNORMAL HIGH (ref 6–20)
CO2: 28 mmol/L (ref 22–32)
Calcium: 9 mg/dL (ref 8.9–10.3)
Chloride: 94 mmol/L — ABNORMAL LOW (ref 98–111)
Creatinine, Ser: 7.07 mg/dL — ABNORMAL HIGH (ref 0.61–1.24)
GFR, Estimated: 8 mL/min — ABNORMAL LOW (ref >60)
Glucose, Bld: 112 mg/dL — ABNORMAL HIGH (ref 70–99)
Phosphorus: 6.3 mg/dL — ABNORMAL HIGH (ref 2.5–4.6)
Potassium: 4.6 mmol/L (ref 3.5–5.1)
Sodium: 132 mmol/L — ABNORMAL LOW (ref 135–145)

## 2020-11-21 LAB — CBC
HCT: 25.4 % — ABNORMAL LOW (ref 39.0–52.0)
Hemoglobin: 8.1 g/dL — ABNORMAL LOW (ref 13.0–17.0)
MCH: 28.8 pg (ref 26.0–34.0)
MCHC: 31.9 g/dL (ref 30.0–36.0)
MCV: 90.4 fL (ref 80.0–100.0)
Platelets: 388 10*3/uL (ref 150–400)
RBC: 2.81 MIL/uL — ABNORMAL LOW (ref 4.22–5.81)
RDW: 14.6 % (ref 11.5–15.5)
WBC: 9.9 10*3/uL (ref 4.0–10.5)
nRBC: 0 % (ref 0.0–0.2)

## 2020-11-21 LAB — GLUCOSE, CAPILLARY
Glucose-Capillary: 100 mg/dL — ABNORMAL HIGH (ref 70–99)
Glucose-Capillary: 101 mg/dL — ABNORMAL HIGH (ref 70–99)
Glucose-Capillary: 123 mg/dL — ABNORMAL HIGH (ref 70–99)
Glucose-Capillary: 124 mg/dL — ABNORMAL HIGH (ref 70–99)
Glucose-Capillary: 77 mg/dL (ref 70–99)
Glucose-Capillary: 87 mg/dL (ref 70–99)
Glucose-Capillary: 98 mg/dL (ref 70–99)

## 2020-11-21 LAB — MAGNESIUM: Magnesium: 1.6 mg/dL — ABNORMAL LOW (ref 1.7–2.4)

## 2020-11-21 SURGERY — CHEST TUBE INSERTION
Anesthesia: LOCAL | Laterality: Right

## 2020-11-21 MED ORDER — MELATONIN 5 MG PO TABS
5.0000 mg | ORAL_TABLET | Freq: Every day | ORAL | Status: DC
  Administered 2020-11-21 – 2020-11-23 (×3): 5 mg
  Filled 2020-11-21 (×3): qty 1

## 2020-11-21 MED ORDER — HEPARIN SODIUM (PORCINE) 1000 UNIT/ML IJ SOLN
INTRAMUSCULAR | Status: AC
  Administered 2020-11-21: 1000 [IU]
  Filled 2020-11-21: qty 4

## 2020-11-21 MED ORDER — LIDOCAINE HCL (PF) 1 % IJ SOLN
INTRAMUSCULAR | Status: AC
  Administered 2020-11-21: 15:00:00 10 mL
  Filled 2020-11-21: qty 30

## 2020-11-21 MED ORDER — HEPARIN SODIUM (PORCINE) 5000 UNIT/ML IJ SOLN: 5000.0000 [IU] | Freq: Three times a day (TID) | INTRAMUSCULAR | Status: DC

## 2020-11-21 MED ORDER — MIDODRINE HCL 5 MG PO TABS
10.0000 mg | ORAL_TABLET | Freq: Three times a day (TID) | ORAL | Status: DC
  Administered 2020-11-21 (×3): 10 mg
  Filled 2020-11-21 (×3): qty 2

## 2020-11-21 MED ORDER — HEPARIN SODIUM (PORCINE) 5000 UNIT/ML IJ SOLN
5000.0000 [IU] | Freq: Three times a day (TID) | INTRAMUSCULAR | Status: DC
  Administered 2020-11-21 – 2020-12-14 (×60): 5000 [IU] via SUBCUTANEOUS
  Filled 2020-11-21 (×61): qty 1

## 2020-11-21 MED ORDER — SODIUM CHLORIDE 0.9 % IV BOLUS
500.0000 mL | Freq: Once | INTRAVENOUS | Status: AC
  Administered 2020-11-21: 15:00:00 500 mL via INTRAVENOUS

## 2020-11-21 MED ORDER — SODIUM CHLORIDE 0.9 % IV BOLUS
500.0000 mL | Freq: Once | INTRAVENOUS | Status: AC
  Administered 2020-11-21: 13:00:00 500 mL via INTRAVENOUS

## 2020-11-21 MED ORDER — SODIUM CHLORIDE 0.9% FLUSH
10.0000 mL | Freq: Three times a day (TID) | INTRAVENOUS | Status: DC
  Administered 2020-11-21 – 2020-12-04 (×24): 10 mL

## 2020-11-21 MED ORDER — MAGNESIUM SULFATE 4 GM/100ML IV SOLN
4.0000 g | Freq: Once | INTRAVENOUS | Status: AC
  Administered 2020-11-21: 09:00:00 4 g via INTRAVENOUS
  Filled 2020-11-21: qty 100

## 2020-11-21 MED ORDER — NEPRO/CARBSTEADY PO LIQD
1000.0000 mL | ORAL | Status: DC
  Administered 2020-11-21 – 2020-11-23 (×3): 1000 mL
  Filled 2020-11-21 (×4): qty 1000

## 2020-11-21 MED ORDER — NEPRO/CARBSTEADY PO LIQD: 480.0000 mL | ORAL | Status: DC

## 2020-11-21 MED ORDER — NOREPINEPHRINE 4 MG/250ML-% IV SOLN
0.0000 ug/min | INTRAVENOUS | Status: DC
  Administered 2020-11-21: 15:00:00 2 ug/min via INTRAVENOUS
  Administered 2020-11-22: 07:00:00 4 ug/min via INTRAVENOUS
  Filled 2020-11-21 (×2): qty 250

## 2020-11-21 NOTE — Progress Notes (Signed)
0318 BS 77 when pt returned from dialysis. Assisted pt with eating and drinking. Rechecked BS 0426 100. Tech and nurse bathed pt and dressings changed to sacral and back where chest tube was. Dressing clean dry and intact. Pt tolerated well.

## 2020-11-21 NOTE — Progress Notes (Signed)
Physical Therapy Wound Treatment Patient Details  Name: Travis [name] MRN: [id number] Date of Birth: 01/20/1961  Today's Date: 11/21/2020 Time: 1129-1205 Time Calculation (min): 36 min  Subjective  Subjective: Lethargic/sleeping and nonsensical speech Patient and Family Stated Goals: None stated Prior Treatments: Dressing changes  Pain Score:  Pt tolerated well with no complaints of pain  Wound Assessment  Pressure Injury 11/14/20 Buttocks Mid;Right;Left Unstageable - Full thickness tissue loss in which the base of the injury is covered by slough (yellow, tan, gray, green or brown) and/or eschar (tan, brown or black) in the wound bed. (Active)  Wound Image   11/21/20 1451  Dressing Type ABD;Barrier Film (skin prep);Gauze (Comment);Moist to dry 11/21/20 1451  Dressing Changed;Clean;Dry;Intact 11/21/20 1451  Dressing Change Frequency Daily 11/21/20 1451  State of Healing Eschar 11/21/20 1451  Site / Wound Assessment Pink;Yellow;Brown 11/21/20 1451  % Wound base Red or Granulating 50% 11/21/20 1451  % Wound base Yellow/Fibrinous Exudate 40% 11/21/20 1451  % Wound base Black/Eschar 10% 11/21/20 1451  % Wound base Other/Granulation Tissue (Comment) 0% 11/21/20 1451  Peri-wound Assessment Intact 11/21/20 1451  Wound Length (cm) 11 cm 11/21/20 1200  Wound Width (cm) 17 cm 11/21/20 1200  Wound Depth (cm) 2 cm 11/21/20 1200  Wound Surface Area (cm^2) 187 cm^2 11/21/20 1200  Wound Volume (cm^3) 374 cm^3 11/21/20 1200  Margins Unattached edges (unapproximated) 11/21/20 1451  Drainage Amount Minimal 11/21/20 1451  Drainage Description Serosanguineous 11/21/20 1451  Treatment Debridement (Selective);Hydrotherapy (Pulse lavage);Packing (Saline gauze) 11/21/20 1451   Santyl applied to wound bed prior to applying dressing.     Hydrotherapy Pulsed lavage therapy - wound location: Buttocks Pulsed Lavage with Suction (psi): 12 psi Pulsed Lavage with Suction - Normal Saline Used: 1000  mL Pulsed Lavage Tip: Tip with splash shield Selective Debridement Selective Debridement - Location: Buttocks Selective Debridement - Tools Used: Forceps;Scissors Selective Debridement - Tissue Removed: Eschar and yellow slough   Wound Assessment and Plan  Wound Therapy - Assess/Plan/Recommendations Wound Therapy - Clinical Statement: Progressing with debridement. Wife present at beginning of session and questions answered within scope of practice. This patient will benefit from continued hydrotherapy for selective removal of unviable tissue, to decrease bioburden, and promote wound bed healing. Wound Therapy - Functional Problem List: Global weakness Factors Delaying/Impairing Wound Healing: Immobility;Multiple medical problems Hydrotherapy Plan: Debridement;Dressing change;Patient/family education;Pulsatile lavage with suction Wound Therapy - Frequency: 6X / week Wound Therapy - Follow Up Recommendations: Skilled nursing facility Wound Plan: See above  Wound Therapy Goals- Improve the function of patient's integumentary system by progressing the wound(s) through the phases of wound healing (inflammation - proliferation - remodeling) by: Decrease Necrotic Tissue to: 20% Decrease Necrotic Tissue - Progress: Progressing toward goal Increase Granulation Tissue to: 80% Increase Granulation Tissue - Progress: Progressing toward goal Goals/treatment plan/discharge plan were made with and agreed upon by patient/family: Yes Time For Goal Achievement: 7 days Wound Therapy - Potential for Goals: Good  Goals will be updated until maximal potential achieved or discharge criteria met.  Discharge criteria: when goals achieved, discharge from hospital, MD decision/surgical intervention, no progress towards goals, refusal/missing three consecutive treatments without notification or medical reason.  GP     Thelma Comp 11/21/2020, 2:57 PM   Rolinda Roan, PT, DPT Acute Rehabilitation  Services Pager: [phone] Office: [phone]

## 2020-11-21 NOTE — Progress Notes (Signed)
Progress Note  Patient Name: Travis [name] Date of Encounter: 11/21/2020  [facility] Cardiologist: Glenetta Hew, MD   Subjective   Sitting up in bed. Frustrated today. Says this is no way to live. Wife at the bedside helping him eat.   Inpatient Medications    Scheduled Meds: . alteplase (TPA) for intrapleural administration  10 mg Intrapleural Q12H   And  . pulmozyme (DORNASE) for intrapleural administration  5 mg Intrapleural Q12H  . amiodarone  200 mg Per Tube BID  . atorvastatin  80 mg Per Tube q1800  . chlorhexidine  15 mL Mouth Rinse BID  . Chlorhexidine Gluconate Cloth  6 each Topical Q0600  . collagenase   Topical Daily  . doxercalciferol  5 mcg Intravenous Q M,W,F  . feeding supplement (NEPRO CARB STEADY)  237 mL Oral BID BM  . feeding supplement (NEPRO CARB STEADY)  480 mL Per Tube Q24H  . guaiFENesin  15 mL Per Tube Q4H  . insulin aspart  0-6 Units Subcutaneous Q4H  . lactobacillus  1 g Per Tube TID WC  . mouth rinse  15 mL Mouth Rinse q12n4p  . melatonin  5 mg Per Tube QHS  . midodrine  10 mg Per Tube TID WC  . multivitamin  1 tablet Per Tube QHS  . nutrition supplement (JUVEN)  1 packet Per Tube BID BM  . pantoprazole sodium  40 mg Per Tube BID   Followed by  . [START ON 12/18/2020] pantoprazole sodium  40 mg Per Tube Daily  . polyethylene glycol  17 g Per Tube Daily  . QUEtiapine  100 mg Per Tube QHS  . sevelamer carbonate  1.6 g Per Tube TID WC  . sodium chloride flush  10 mL Intracatheter Q8H  . sodium chloride flush  10-40 mL Intracatheter Q12H  . sodium chloride flush  3 mL Intravenous Q12H  . sodium zirconium cyclosilicate  10 g Oral BID  . sucralfate  1 g Per Tube QID  . ticagrelor  90 mg Per Tube BID   Continuous Infusions: . sodium chloride Stopped (11/10/20 1944)  . sodium chloride Stopped (10/28/20 0954)  .  ceFAZolin (ANCEF) IV 2 g (11/17/20 1240)  . magnesium sulfate bolus IVPB 4 g (11/21/20 0843)   PRN Meds: sodium chloride,  acetaminophen, clonazepam, HYDROmorphone (DILAUDID) injection, ondansetron (ZOFRAN) IV, oxyCODONE, Resource ThickenUp Clear, sennosides, sodium chloride, sodium chloride flush, sodium chloride flush   Vital Signs    Vitals:   11/21/20 0243 11/21/20 0258 11/21/20 0325 11/21/20 0759  BP: (!) 80/51 (!) 81/52 (!) 84/57 (!) 75/51  Pulse: 86 88 97 96  Resp: (!) 21 (!) 23 20 20   Temp:  98.7 F (37.1 C)  99.6 F (37.6 C)  TempSrc:  Oral  Oral  SpO2: 96% 93% 98% 92%  Weight:   89.8 kg   Height:        Intake/Output Summary (Last 24 hours) at 11/21/2020 0847 Last data filed at 11/21/2020 0838 Gross per 24 hour  Intake 478 ml  Output 650 ml  Net -172 ml   Last 3 Weights 11/21/2020 11/18/2020 11/17/2020  Weight (lbs) 198 lb 182 lb 15.7 oz 171 lb 11.8 oz  Weight (kg) 89.812 kg 83 kg 77.9 kg      Telemetry    SR - Personally Reviewed  ECG    No new tracing  Physical Exam  Frail, ill appearing male GEN: No acute distress. Cortrack in place  Neck: No JVD Cardiac:  RRR, no murmurs, rubs, or gallops.  Respiratory: diminished in right base, course BS GI: Soft, nontender, non-distended. Rectal tube in place MS: No edema; No deformity. Neuro:  Nonfocal  Psych: Normal affect   Labs    High Sensitivity Troponin:   Recent Labs  Lab 10/28/20 0629 10/28/20 1046 10/28/20 2040 10/29/20 2121 10/30/20 0548  TROPONINIHS 3,155* >27,000* >27,000* >27,000* >27,000*      Chemistry Recent Labs  Lab 11/16/20 1628 11/17/20 0556 11/19/20 0658 11/20/20 0500 11/20/20 2033  NA  --    < > 132* 130* 128*  K  --    < > 5.6* 5.9* 5.6*  CL  --    < > 93* 90* 90*  CO2  --    < > 24 23 22   GLUCOSE  --    < > 85 86 91  BUN  --    < > 98* 121* 143*  CREATININE  --    < > 8.84* 10.61* 11.45*  CALCIUM  --    < > 9.5 9.5 9.2  PROT 5.4*  --   --   --   --   ALBUMIN  --    < > 1.3* 1.3* 1.3*  GFRNONAA  --    < > 6* 5* 5*  ANIONGAP  --    < > 15 17* 16*   < > = values in this interval not  displayed.     Hematology Recent Labs  Lab 11/19/20 0658 11/20/20 2140 11/21/20 0526  WBC 14.0* 13.8* 9.9  RBC 3.10* 3.25* 2.81*  HGB 9.0* 9.2* 8.1*  HCT 27.9* 28.8* 25.4*  MCV 90.0 88.6 90.4  MCH 29.0 28.3 28.8  MCHC 32.3 31.9 31.9  RDW 15.2 14.6 14.6  PLT 298 410* 388    BNPNo results for input(s): BNP, PROBNP in the last 168 hours.   DDimer No results for input(s): DDIMER in the last 168 hours.   Radiology    DG Chest 1 View  Result Date: 11/21/2020 CLINICAL DATA:  Pleural effusion. EXAM: CHEST  1 VIEW COMPARISON:  11/20/2020. FINDINGS: Feeding tube, left subclavian line, right IJ line in stable position. Stable cardiomegaly. Progressive dense right base atelectasis/consolidation. Large progressive right-sided pleural effusion. Previously identified drainage tube over the right chest/upper abdomen not identified on today's exam. Mild right upper chest wall subcutaneous emphysema. IMPRESSION: 1. Progressive dense right base atelectasis/consolidation. Large progressive right-sided pleural effusion. 2. Previously identified drainage tube over the right chest/upper abdomen not identified on today's exam. 3. Feeding tube and bilateral IJ lines in stable position. 4. Stable cardiomegaly. Electronically Signed   By: Marcello Moores  Register   On: 11/21/2020 06:23   IR US Guide Vasc Access Left  Result Date: 11/20/2020 INDICATION: 59 year old male with a history of malfunctioning left upper extremity fistula. 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 A recent duplex approximately 2-3 weeks prior demonstrates a flow volume of 1816 mL per minute, with no evidence stenosis. Given malfunction, he presents for fistulagram EXAM: ULTRASOUND-GUIDED ACCESS LEFT UPPER EXTREMITY FISTULA BALLOON ANGIOPLASTY OF OUTFLOW STENOSES MEDICATIONS: None. ANESTHESIA/SEDATION: Moderate Sedation Time:  0 minutes The patient was  continuously monitored during the procedure by the interventional radiology nurse under my direct supervision. FLUOROSCOPY TIME:  Fluoroscopy Time: 3 minutes 24 seconds (40 mGy). COMPLICATIONS: None PROCEDURE: Informed written consent was obtained from the patient after a thorough discussion of the procedural risks, benefits and alternatives. All  questions were addressed. Maximal Sterile Barrier Technique was utilized including caps, mask, sterile gowns, sterile gloves, sterile drape, hand hygiene and skin antiseptic. A timeout was performed prior to the initiation of the procedure. Ultrasound survey was performed with images stored and sent to PACs. The left upper extremity was prepped and draped in the usual sterile fashion. Ultrasound guidance was used to access the left upper extremity graft with a micropuncture kit. Once the 4 French sheath was within the graft, left upper extremity fistulogram was performed. Images were carried out to the central vasculature. Bentson wire was then placed into the outflow and a 6 French sheath was placed. Combination of Glidewire and the Bentson wire and an angled Kumpe the catheter were used to navigate through the tandem stenoses of the venous outflow. Once the Glidewire and the Kumpe the catheter were through the tandem stenoses, the Bentson wire was placed through the catheter and the catheter was removed. Serial standard balloon angioplasty was then performed at the outflow stenoses first 6 mm then 8 mm. Finally 12 mm balloon angioplasty was performed first at the most central of the stenoses (greater than 95%) and then at the more distal. After balloon angioplasty of venous outflow was performed, balloon was inflated and reflux images of the arterial anastomosis performed. Given the volume measured on the recent duplex of greater than 1800 cc/minute we elected not to puncture in a peripheral directed access. All catheters wires were removed and a suture was placed at the  access point for hemostasis. Sterile bandage was placed. Excellent thrill was confirmed at the conclusion. Patient tolerated the procedure well remained hemodynamically stable throughout. No complications were encountered and no significant blood loss. 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 Both of these were treated to 0% residual with standard balloon angioplasty to 12 mm. Excellent thrill upon completion. IMPRESSION: Status post ultrasound guided access left upper extremity fistula for standard balloon angioplasty treatment of tandem high-grade outflow stenoses, with 0% residual after treatment. Signed, Dulcy Fanny. Dellia Nims, RPVI [facility] ACCESS: Recommend early duplex evaluation of flow volumes and the arteriovenous anastomosis. This access remains amenable to future percutaneous interventions as clinically indicated. Electronically Signed   By: Corrie Mckusick D.O.   On: 11/20/2020 15:11   DG CHEST PORT 1 VIEW  Result Date: 11/20/2020 CLINICAL DATA:  Hypoxia. EXAM: PORTABLE CHEST 1 VIEW COMPARISON:  11/19/2020 FINDINGS: 0625 hours. The cardio pericardial silhouette is enlarged. There is pulmonary vascular congestion without overt pulmonary edema. Interval improvement in aeration at the right base with persistent right base collapse/consolidative opacity and pleural fluid. The right pleural drain seen previously has migrated inferiorly and intrathoracic positioning cannot be confirmed. A feeding tube passes into the stomach although the distal tip position is not included on the film. Left subclavian central line is positioned at the innominate vein confluence. Right IJ central line tip overlies the mid to lower right atrium. IMPRESSION: 1. Cardiomegaly with vascular congestion. Interval improvement in aeration at the right base  with persistent right base collapse/consolidative opacity and right pleural effusion. 2. Interval migration of right pleural drain, now potentially extra thoracic given the low position. Electronically Signed   By: Misty Stanley M.D.   On: 11/20/2020 06:36   DG Chest Port 1 View  Result Date: 11/19/2020 CLINICAL DATA:  Chest tube. EXAM: PORTABLE CHEST 1 VIEW COMPARISON:  November 18, 2020. FINDINGS: Stable cardiomegaly. Feeding tube is seen entering the stomach. Right internal jugular catheter is unchanged. Right-sided chest tube is unchanged without pneumothorax. Stable bibasilar atelectasis or infiltrates are noted, with probable small bilateral pleural effusions. Bony thorax is unremarkable. IMPRESSION: Stable support apparatus. Stable bibasilar atelectasis or infiltrates, with probable small bilateral pleural effusions. No pneumothorax is noted. Electronically Signed   By: Marijo Conception M.D.   On: 11/19/2020 10:56   IR AV DIALY SHUNT INTRO NEEDLE/INTRAC INITIAL W/PTA/STENT/IMG LEFT  Result Date: 11/20/2020 INDICATION: 59 year old male with a history of malfunctioning left upper extremity fistula. 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 A recent duplex approximately 2-3 weeks prior demonstrates a flow volume of 1816 mL per minute, with no evidence stenosis. Given malfunction, he presents for fistulagram EXAM: ULTRASOUND-GUIDED ACCESS LEFT UPPER EXTREMITY FISTULA BALLOON ANGIOPLASTY OF OUTFLOW STENOSES MEDICATIONS: None. ANESTHESIA/SEDATION: Moderate Sedation Time:  0 minutes The patient was continuously monitored during the procedure by the interventional radiology nurse under my direct supervision. FLUOROSCOPY TIME:  Fluoroscopy Time: 3 minutes 24 seconds (40 mGy). COMPLICATIONS: None PROCEDURE: Informed written consent was obtained from the patient after a thorough discussion of the procedural risks, benefits  and alternatives. All questions were addressed. Maximal Sterile Barrier Technique was utilized including caps, mask, sterile gowns, sterile gloves, sterile drape, hand hygiene and skin antiseptic. A timeout was performed prior to the initiation of the procedure. Ultrasound survey was performed with images stored and sent to PACs. The left upper extremity was prepped and draped in the usual sterile fashion. Ultrasound guidance was used to access the left upper extremity graft with a micropuncture kit. Once the 4 French sheath was within the graft, left upper extremity fistulogram was performed. Images were carried out to the central vasculature. Bentson wire was then placed into the outflow and a 6 French sheath was placed. Combination of Glidewire and the Bentson wire and an angled Kumpe the catheter were used to navigate through the tandem stenoses of the venous outflow. Once the Glidewire and the Kumpe the catheter were through the tandem stenoses, the Bentson wire was placed through the catheter and the catheter was removed. Serial standard balloon angioplasty was then performed at the outflow stenoses first 6 mm then 8 mm. Finally 12 mm balloon angioplasty was performed first at the most central of the stenoses (greater than 95%) and then at the more distal. After balloon angioplasty of venous outflow was performed, balloon was inflated and reflux images of the arterial anastomosis performed. Given the volume measured on the recent duplex of greater than 1800 cc/minute we elected not to puncture in a peripheral directed access. All catheters wires were removed and a suture was placed at the access point for hemostasis. Sterile bandage was placed. Excellent thrill was confirmed at the conclusion. Patient tolerated the procedure well remained hemodynamically stable throughout. No complications were encountered and no significant blood loss. 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 Tandem  outflow stenoses, the more distal 80% stenosis, the more central, greater than 95% stenosis. Both of these were treated to 0% residual with standard balloon angioplasty to 12 mm. Excellent thrill upon completion. IMPRESSION: Status post ultrasound guided access left upper extremity fistula for standard balloon angioplasty treatment of tandem high-grade outflow stenoses, with 0% residual after treatment. Signed, Dulcy Fanny. Dellia Nims, RPVI [facility] Inter[facility] Radiology Special[facility] Radiology ACCESS: Recommend early duplex evaluation of flow  volumes and the arteriovenous anastomosis. This access remains amenable to future percutaneous interventions as clinically indicated. Electronically Signed   By: Corrie Mckusick D.O.   On: 11/20/2020 15:11    Cardiac Studies   Cath: 10/28/20    There is mild to moderate left ventricular systolic dysfunction. The left ventricular ejection fraction is 40-45% by visual estimate - > anterior-anterolateral apical hypokinesis  LV end diastolic pressure is severely elevated -> on recheck post PCI, EDP 30 mmHg  Mid LAD lesion is 99% stenosed. 1st Diag lesion is 55% stenosed just proximal to the lesion  A drug-eluting stent was successfully placed crossing the diagonal branch, using a [place] [id number]. Postdilated to 3.1 mm  Post intervention, there is a 0% residual stenosis.  LPAV lesion is 99% stenosed -> ostial lesion (LCx is 90  turn followed by another 90  turn into the AVG)  Prox RCA lesion is 80% stenosed -> concentric napkin ring calcified lesion.  SUMMARY  Acute anterolateral ST elevation MI with Culprit lesion being the 99% mid LAD lesion (just past major 1st Diag/D1) along with multivessel CAD including 99% ostial AV groove LCx and 80% calcified napkin ring proximal RCA lesion with extensive calcification throughout the RCA. ? Successful PTCA and DES PCI of the LAD crossing D1 -resolute Onyx DES 2.75 mm x 18 mm postdilated  to 3.1 mm. ? With PCI, there was stabilization of significant ectopy, AIVR and PVCs  Mild to moderate reduced EF with anterior anterolateral hypokinesis, EF roughly 40 to 45%.  Initial evaluation suggested normal EDP, but on recheck post PCI LVEDP of 30 mmHg, severely elevated consistent with ACUTE DIASTOLIC HEART FAILURE  Significantly dilated aortic root requiring AL-1 guide catheter for both Left and Right Coronary Angiography.   RECOMMENDATIONS  Admit to CCU, restart IV heparin 8 hours after sheath removal  Check 2D echo for better assessment of EF  Plan to review cath films with other IC cardiologist, anticipate staged PCI of at least the RCA which may require atherectomy versus lithotripsy, and likely the ostial AV groove circumflex.  If he has signs of dyspnea, consider earlier dialysis than tomorrow. ->Would consult for nephrology to see today.  He is already on labetalol and Norvasc. We will continue current home medications.  Uninterrupted DAPT times X 1 year.  Glenetta Hew, M.D., M.S. Interventional Cardiologist   Diagnostic Dominance: Right    Intervention    Echo: 11/13/20  IMPRESSIONS    1. Left ventricular ejection fraction, by estimation, is 45 to 50%. The  left ventricle has mildly decreased function. The left ventricle  demonstrates regional wall motion abnormalities (see scoring  diagram/findings for description). There is severe  concentric left ventricular hypertrophy. Left ventricular diastolic  parameters are consistent with Grade I diastolic dysfunction (impaired  relaxation).  2. Right ventricular systolic function is normal. The right ventricular  size is normal. There is normal pulmonary artery systolic pressure. The  estimated right ventricular systolic pressure is 63.1 mmHg.  3. The mitral valve is degenerative. Mild mitral valve regurgitation. No  evidence of mitral stenosis.  4. The aortic valve is tricuspid. There is  mild calcification of the  aortic valve. There is mild thickening of the aortic valve. Aortic valve  regurgitation is mild. Mild aortic valve sclerosis is present, with no  evidence of aortic valve stenosis.  5. The inferior vena cava is normal in size with greater than 50%  respiratory variability, suggesting right atrial pressure of 3 mmHg.  Comparison(s): Changes from prior study are noted. EF has improved to  45-50%. WMAs remain the mid septum into apex. RVSP has improved.   Patient Profile     59 y.o. male  with ESRD on HD, HTN, tobacco abuse, CAD presenting with anterior STEMI 10/28/20 s/p placement of a stent in the LAD with residual disease in the RCA and Circumflex. He developed atrial fib with RVR. Hospital course complicated by MSSA bacteremia with shock, upper GI bleeding secondary to severe esophagitis and duodenal ulcer, ICU delirium, metabolic encephalopathy, acute hypoxemic respiratory failure, anemia.   Assessment & Plan    1. Anterior STEMI: culprit lesion noted in the mLAD 99% treated with DESx1. Initially placed on DAPT with ASA/Brilinta with plans to consider for staged intervention to the RCA. Now on monotherapy with Brilinta given significant anemia throughout admission. Was briefly on IV cangrelor but back to Brilinta. Plans for staged intervention to the RCA on hold with his multiple co-morbidities, do not anticipate this being done this admission.  -- continue on Brilinta and statin  2. CAD: as above recent LAD stenting with residual disease in the RCA being treated medically at this time. Did have brief PEA arrest this admission.  3. Septic Shock: 2/2 MSSA bacteremia. Total of 6 weeks of IV antibiotics per ID. Still treated with midodrine.  4. PAF: developed rapid rates on 11/21 and 11/23 requiring cardioversion on each occasion. Now on oral amiodarone 238m daily. Not considered a candidate for [place] anemia and GI bleed -- given the need for midodrine,  coreg was stopped yesterday. Remains on Amiodarone 2038mBID  5. Ischemic cardiomyopathy: EF noted at 45-50%. Remains euvolemic on exam.   6. Anemia: s/p transfusion. Seen by GI. Hgb back down from 9.2>>8.1 this morning.  7. Moderate to Severe MR: repeat echo 12/6 showed mild MR instead of moderate to severe  8. Loculated Pleural effusion: right sided effusion. Had a chest tube in placed on 12/9, dislodged yesterday morning. CXR with increasing effusion today. Planned for replacement of chest tube this afternoon.  9. ESRD on HD: followed by Nephrology  10. Pressure sacral wound: management per primary  11. Hypotension: bloods pressures have been low most of the evening and this morning. Last reading was 90 systolic. Continue midodrine 1026mID  For questions or updates, please contact [email or web address] consult [email or web address] for contact info under        Signed, LinReino BellisP  11/21/2020, 8:47 AM

## 2020-11-21 NOTE — Progress Notes (Signed)
Inpatient Rehab Admissions Coordinator:   At this time we are recommending a CIR consult and I will place an order per our protocol.   Shann Medal, PT, DPT Admissions Coordinator [phone] 11/21/20  11:30 AM

## 2020-11-21 NOTE — Progress Notes (Addendum)
PROGRESS NOTE        PATIENT DETAILS Name: Travis [name] Age: 59 y.o. Sex: male Date of Birth: June 22, 1961 Admit Date: 10/28/2020 Admitting Physician Leonie Man, MD XKG:YJEHUD, Marjory Lies, MD  Brief Narrative: Patient is a 59 y.o. male with history of ESRD, HTN, CAD-who presented with anterior STEMI-treated with DES to LAD-hospital course prolonged and complicated by atrial fibrillation with RVR requiring cardioversion x2,, septic shock due to MSSA bacteremia, brief PEA arrest requiring CPR, acute hypoxic respiratory failure requiring intubation, upper GI bleed with acute blood loss anemia due to severe esophagitis requiring PRBC transfusion, severe metabolic encephalopathy/ICU delirium, bilateral lung abscesses with empyema requiring chest tube insertion--stabilized in the ICU-subsequently transferred to the [facility] on 12/12.  Significant events: 11/20>> cardiac cath with PCI to LAD 11/21>> A. fib with RVR with hypotension-cardioversion by cardiology 11/23>> A. fib with RVR with hypotension-cardioversion by cardiology. 11/23>> worsening respiratory distress-intubated 11/24>> CODE BLUE-asystole-ROSC after 4 minutes of CPR. 12/3>>Extubated 12/9>> chest tube placed. 12/12>> transferred to [facility] 12/13>> chest tube accidentally dislodged-and came out 12/14>> worsening right-sided pleural effusion on chest x-ray-hypotensive>>to ICU  Significant studies: 11/23>>TTE: EF 35-40%, regional wall motion abnormalities, moderate to severe MR  11/23 CT Chest>> multifocal pneumonia-septic emboli/metastatic disease less likely.   11/24>> TEE: EF 35-40%, severe MR, AR with echodensities that could be consistent with calcification. 11/22 LUE Vas Upper Extremity Doppler>> Arteriovenous fistula-Aneurysmal dilatation noted. 11/24 CT abdomen - no retroperitoneal hematoma 11/27>> ultrasound left upper extremity: No abscess or mass identified. 11/29 MR Brain>> No  evidence acute intracranial abnormality 11/29>> MR Cervical Spine: Trace facet effusions, minimal edema along C6, cervical spondylosis  11/29 MR Thoracic Spine: Mild edema along T6, no significant marrow edema 11/29 MR Lumbar Spine: Trace L4-L5 facet effusions with surrounding marrow edema  11/30>> CT abdomen/pelvis: No cause identified for nausea/vomiting, mild dorsal pancreatic duct dilatation, 12/6>> Limited echo: EF 45-50% 12/8>> CT abdomen/pelvis: no retroperitoneal bleeding, anasarca, bilateral lower lobe lung consolidation 12/11>> CT chest: Resolving multi lobar pneumonia with evidence of widespread pulmonary necrosis/multiple pulmonary abscesses, bilateral empyemas. 12/14>> chest x-ray: Progressive large right-sided pleural effusion  Antimicrobial therapy: Ancef: 11/21>>  Microbiology data: 11/21>> blood culture: MSSA 11/22>> blood culture: No growth 11/23>> blood culture: No growth 11/23>> tracheal aspirate: MSSA 11/24>> blood culture: No growth 11/30>> blood culture: Staph epidermidis 12/2>> blood culture: No growth 12/9>> right pleural fluid culture: No growth  Procedures : 12/9>> insertion of right chest tube by PCCM 12/2>>EGD: Moderately severe reflux and erosive esophagitis with no active bleeding ETT: 11/23>> 12/3 Central line 11/21 >> 11/29  Consults: PCCM, ID, GI Sadie [facility]er), nephrology, cardiology,IR  DVT Prophylaxis : SCD's  Subjective: Slept well last night-however hypotensive to the 14H-70Y systolic overnight-started on midodrine this morning-Per nursing report-blood pressure now slowly improving in in the high 80s.  Although hypotensive-he does not appear to be overtly symptomatic.  Brown stools in rectal tube/bag.  Assessment/Plan: Acute STEMI-s/p DES to LAD on 11/20: On Brilinta, statin-no obvious chest discomfort today-cardiology plans to pursue eventual PCI to LCx, RCA when GI bleeding and other issues are more stable.  PAF with RVR associated with  hypotension requiring DC cardioversion x2: Maintaining sinus rhythm-not a candidate for anticoagulation due to GI bleeding and severity of anemia.  Continue amiodarone and low-dose beta-blocker.  Brief PEA arrest: Continue telemetry monitoring-work-up as above-cardiology following.  Chronic systolic heart  failure/ischemic cardiomyopathy: Euvolemic-diuresis with HD.  Tolerating low-dose beta-blocker well.  Moderate to severe MR: Stable for outpatient follow-up with cardiology.  Septic shock due to MSSA bacteremia: Sepsis physiology had resolved-however has developed hypotension last night-see below-remains on Ancef until 12/19/2020.  ID has signed off.   Hypotension: Etiology unknown-had HD yesterday but did not have fluid removed-starting midodrine 10 mg 3 times daily-giving 500 cc of IV fluid bolus-we will reassess.  With worsening right-sided pleural effusion/empyema-concern that he may be redeveloping sepsis physiology.  If hypotension persists-we will reconsult PCCM at some point.  Addendum: Persistently hypotensive-in spite of IV fluid boluses and initiation of midodrine.  Discussed with PCCM-Dr. Smith-being transferred to ICU for pressor support.  Necrotizing lung infection/lung abscess with empyema: Chest tube accidentally dislodged-PCCM plans to follow-remains off chest tube for now-follow closely.  Remains on IV Ancef.  Upper GI bleed with acute blood loss anemia superimposed on anemia of critical illness: Brown stools in rectal tube-hemoglobin stabilizing-continue PPI/Carafate.  EGD as above.  Follow CBC closely.  ESRD: On HD-nephrology following.  Hyperkalemia: On Lokelma-for HD today-repeat electrolytes tomorrow.  Dysphagia: Due to severe generalized weakness/deconditioning-NG tube in place-will discuss with nursing staff-if diet stable-suspect we could potentially take out NG tube in the next day or so.  SLP evaluation completed today-upgraded to a dysphagia 2 diet.     Delirium/acute metabolic encephalopathy: Continues to have episodes of confusion-mostly at night per spouse-we will increase Seroquel to 50 mg nightly, continue as needed Klonopin.  Watch closely.  Debility/deconditioning: Secondary to acute illness-eventually will require CIR when closer to discharge.  Constipation: Brown loose stools in the rectal tube.  17 mm left adrenal nodule: Incidental finding-stable for outpatient follow-up.  Unstageable pressure injury/Stage 4 full-thickness injury-to bilateral buttocks due to combination of pressure and moisture: Wound care care following-PT following for hydrotherapy  Pressure Ulcer: Pressure Injury 11/12/20 Buttocks Bilateral;Medial Stage 4 - Full thickness tissue loss with exposed bone, tendon or muscle. (Active)  11/12/20 0800  Location: Buttocks  Location Orientation: Bilateral;Medial  Staging: Stage 4 - Full thickness tissue loss with exposed bone, tendon or muscle.  Wound Description (Comments):   Present on Admission: No     Pressure Injury 11/14/20 Buttocks Mid;Right;Left Unstageable - Full thickness tissue loss in which the base of the injury is covered by slough (yellow, tan, gray, green or brown) and/or eschar (tan, brown or black) in the wound bed. (Active)  11/14/20 1128  Location: Buttocks  Location Orientation: Mid;Right;Left  Staging: Unstageable - Full thickness tissue loss in which the base of the injury is covered by slough (yellow, tan, gray, green or brown) and/or eschar (tan, brown or black) in the wound bed.  Wound Description (Comments):   Present on Admission: No   Nutrition Problem: Nutrition Problem: Severe Malnutrition Etiology: chronic illness Signs/Symptoms: severe muscle depletion,severe fat depletion Interventions: Ensure Enlive (each supplement provides 350kcal and 20 grams of protein),MVI,Tube feeding,Magic cup   Diet: Diet Order            DIET DYS 2 Room service appropriate? Yes with Assist;  Fluid consistency: Thin  Diet effective now                  Code Status: Full code   Family Communication: Spouse Juliene Pina [phone]) over the phone on 12/14.  Disposition Plan: Status is: Inpatient  Remains inpatient appropriate because:Inpatient level of care appropriate due to severity of illness   Dispo: The patient is from: Home  Anticipated d/c is to: CIR              Anticipated d/c date is: > 3 days              Patient currently is not medically stable to d/c.   Barriers to Discharge: Continue close observation to see if patient develops further GI bleeding-remains on IV Ancef for necrotizing pneumonia/empyema/MSSA bacteremia.  Other multiple medical issues as outlined above  Antimicrobial agents: Anti-infectives (From admission, onward)   Start     Dose/Rate Route Frequency Ordered Stop   11/13/20 1200  ceFAZolin (ANCEF) IVPB 2g/100 mL premix        2 g 200 mL/hr over 30 Minutes Intravenous Every M-W-F (Hemodialysis) 11/12/20 1502 12/19/20 2359   11/09/20 1800  ceFAZolin (ANCEF) IVPB 1 g/50 mL premix  Status:  Discontinued        1 g 100 mL/hr over 30 Minutes Intravenous Every 24 hours 11/08/20 1218 11/08/20 1500   11/08/20 2200  ceFAZolin (ANCEF) IVPB 2g/100 mL premix  Status:  Discontinued        2 g 200 mL/hr over 30 Minutes Intravenous Every 12 hours 11/08/20 1500 11/12/20 1502   11/08/20 1000  ceFAZolin (ANCEF) IVPB 1 g/50 mL premix  Status:  Discontinued        1 g 100 mL/hr over 30 Minutes Intravenous Every 24 hours 11/07/20 1439 11/08/20 1218   11/08/20 0415  vancomycin (VANCOREADY) IVPB 1500 mg/300 mL        1,500 mg 150 mL/hr over 120 Minutes Intravenous  Once 11/08/20 0328 11/08/20 0640   11/02/20 2000  ceFAZolin (ANCEF) IVPB 2g/100 mL premix  Status:  Discontinued        2 g 200 mL/hr over 30 Minutes Intravenous Every 12 hours 11/02/20 0742 11/07/20 1439   11/01/20 2200  ceFAZolin (ANCEF) IVPB 2g/100 mL premix  Status:  Discontinued         2 g 200 mL/hr over 30 Minutes Intravenous Every 12 hours 11/01/20 1007 11/01/20 1017   11/01/20 1100  ceFAZolin (ANCEF) IVPB 2g/100 mL premix  Status:  Discontinued        2 g 200 mL/hr over 30 Minutes Intravenous Every 12 hours 11/01/20 1017 11/02/20 0742   10/30/20 0030  ceFAZolin (ANCEF) IVPB 1 g/50 mL premix  Status:  Discontinued        1 g 100 mL/hr over 30 Minutes Intravenous Daily at bedtime 10/29/20 2236 11/01/20 1007   10/29/20 2315  ceFAZolin (ANCEF) IVPB 1 g/50 mL premix  Status:  Discontinued        1 g 100 mL/hr over 30 Minutes Intravenous Daily at bedtime 10/29/20 2217 10/29/20 2236   10/29/20 2215  vancomycin (VANCOREADY) IVPB 1750 mg/350 mL  Status:  Discontinued        1,750 mg 175 mL/hr over 120 Minutes Intravenous  Once 10/29/20 2116 10/29/20 2211   10/29/20 2215  piperacillin-tazobactam (ZOSYN) IVPB 2.25 g  Status:  Discontinued        2.25 g 100 mL/hr over 30 Minutes Intravenous Every 8 hours 10/29/20 2116 10/29/20 2217   10/29/20 2118  vancomycin variable dose per unstable renal function (pharmacist dosing)  Status:  Discontinued         Does not apply See admin instructions 10/29/20 2118 10/29/20 2211       Time spent: 35 minutes-Greater than 50% of this time was spent in counseling, explanation of diagnosis, planning of further management, and coordination of care.  MEDICATIONS: Scheduled Meds: . alteplase (TPA) for intrapleural administration  10 mg Intrapleural Q12H   And  . pulmozyme (DORNASE) for intrapleural administration  5 mg Intrapleural Q12H  . amiodarone  200 mg Per Tube BID  . atorvastatin  80 mg Per Tube q1800  . chlorhexidine  15 mL Mouth Rinse BID  . Chlorhexidine Gluconate Cloth  6 each Topical Q0600  . collagenase   Topical Daily  . doxercalciferol  5 mcg Intravenous Q M,W,F  . feeding supplement (NEPRO CARB STEADY)  237 mL Oral BID BM  . feeding supplement (NEPRO CARB STEADY)  480 mL Per Tube Q24H  . guaiFENesin  15 mL Per Tube  Q4H  . insulin aspart  0-6 Units Subcutaneous Q4H  . lactobacillus  1 g Per Tube TID WC  . mouth rinse  15 mL Mouth Rinse q12n4p  . melatonin  5 mg Per Tube QHS  . midodrine  10 mg Per Tube TID WC  . multivitamin  1 tablet Per Tube QHS  . nutrition supplement (JUVEN)  1 packet Per Tube BID BM  . pantoprazole sodium  40 mg Per Tube BID   Followed by  . [START ON 12/18/2020] pantoprazole sodium  40 mg Per Tube Daily  . polyethylene glycol  17 g Per Tube Daily  . QUEtiapine  100 mg Per Tube QHS  . sevelamer carbonate  1.6 g Per Tube TID WC  . sodium chloride flush  10 mL Intracatheter Q8H  . sodium chloride flush  10-40 mL Intracatheter Q12H  . sodium chloride flush  3 mL Intravenous Q12H  . sodium zirconium cyclosilicate  10 g Oral BID  . sucralfate  1 g Per Tube QID  . ticagrelor  90 mg Per Tube BID   Continuous Infusions: . sodium chloride Stopped (11/10/20 1944)  . sodium chloride Stopped (10/28/20 0954)  .  ceFAZolin (ANCEF) IV 2 g (11/17/20 1240)   PRN Meds:.sodium chloride, acetaminophen, clonazepam, HYDROmorphone (DILAUDID) injection, ondansetron (ZOFRAN) IV, oxyCODONE, Resource ThickenUp Clear, sennosides, sodium chloride, sodium chloride flush, sodium chloride flush   PHYSICAL EXAM: Vital signs: Vitals:   11/21/20 0243 11/21/20 0258 11/21/20 0325 11/21/20 0759  BP: (!) 80/51 (!) 81/52 (!) 84/57 (!) 75/51  Pulse: 86 88 97 96  Resp: (!) 21 (!) _0 Temp:  98.7 F (37.1 C)  99.6 F (37.6 C)  TempSrc:  Oral  Oral  SpO2: 96% 93% 98% 92%  Weight:   89.8 kg   Height:       Filed Weights   11/17/20 1850 11/18/20 0450 11/21/20 0325  Weight: 77.9 kg 83 kg 89.8 kg   Body mass index is 27.62 kg/m.   Gen Exam: Sleepy-but awake-answering questions appropriately.  Chronically sick appearing but not in any distress. HEENT:atraumatic, normocephalic Chest: B/L clear to auscultation anteriorly CVS:S1S2 regular Abdomen:soft non tender, non distended Extremities:no  edema Neurology: Moving all 4 extremities-but with significant generalized weakness. Skin: no rash  I have personally reviewed following labs and imaging studies  LABORATORY DATA: CBC: Recent Labs  Lab 11/17/20 0556 11/17/20 2324 11/18/20 0312 11/19/20 0658 11/20/20 2140 11/21/20 0526  WBC 6.9 7.6 8.6 14.0* 13.8* 9.9  NEUTROABS 5.3  --   --   --   --   --   HGB 8.8* 8.8* 8.6* 9.0* 9.2* 8.1*  HCT 27.5* 25.7* 25.7* 27.9* 28.8* 25.4*  MCV 91.7 88.3 88.9 90.0 88.6 90.4  PLT 175 200 224 298 410* 388    Basic  Metabolic Panel: Recent Labs  Lab 11/17/20 0556 11/17/20 2324 11/18/20 0312 11/19/20 0658 11/20/20 0500 11/20/20 2033 11/21/20 0526  NA 135 136 137 132* 130* 128*  --   K 4.7 4.0 4.4 5.6* 5.9* 5.6*  --   CL 97* 97* 97* 93* 90* 90*  --   CO2 _0 --   GLUCOSE 91 104* 115* 85 86 91  --   BUN 108* 66* 71* 98* 121* 143*  --   CREATININE 8.63* 5.95* 6.42* 8.84* 10.61* 11.45*  --   CALCIUM 9.2 9.2 9.3 9.5 9.5 9.2  --   MG 2.0  --  2.0 1.8 2.0  --  1.6*  PHOS 6.9* 4.9* 5.6* 6.2* 7.1* 7.6*  --     GFR: Estimated Creatinine Clearance: 7.5 mL/min (A) (by C-G formula based on SCr of 11.45 mg/dL (H)).  Liver Function Tests: Recent Labs  Lab 11/16/20 1628 11/17/20 0556 11/17/20 2324 11/18/20 0312 11/19/20 0658 11/20/20 0500 11/20/20 2033  PROT 5.4*  --   --   --   --   --   --   ALBUMIN  --    < > 1.4* 1.3* 1.3* 1.3* 1.3*   < > = values in this interval not displayed.   No results for input(s): LIPASE, AMYLASE in the last 168 hours. No results for input(s): AMMONIA in the last 168 hours.  Coagulation Profile: No results for input(s): INR, PROTIME in the last 168 hours.  Cardiac Enzymes: No results for input(s): CKTOTAL, CKMB, CKMBINDEX, TROPONINI in the last 168 hours.  BNP (last 3 results) No results for input(s): PROBNP in the last 8760 hours.  Lipid Profile: No results for input(s): CHOL, HDL, LDLCALC, TRIG, CHOLHDL, LDLDIRECT in the last  72 hours.  Thyroid Function Tests: No results for input(s): TSH, T4TOTAL, FREET4, T3FREE, THYROIDAB in the last 72 hours.  Anemia Panel: No results for input(s): VITAMINB12, FOLATE, FERRITIN, TIBC, IRON, RETICCTPCT in the last 72 hours.  Urine analysis:    Component Value Date/Time   COLORURINE YELLOW 11/13/2012 1335   APPEARANCEUR CLEAR 11/13/2012 1335   LABSPEC 1.015 11/13/2012 1335   PHURINE 5.5 11/13/2012 1335   GLUCOSEU NEGATIVE 11/13/2012 1335   HGBUR SMALL (A) 11/13/2012 1335   BILIRUBINUR NEGATIVE 11/13/2012 1335   KETONESUR NEGATIVE 11/13/2012 1335   PROTEINUR >300 (A) 11/13/2012 1335   UROBILINOGEN 0.2 11/13/2012 1335   NITRITE NEGATIVE 11/13/2012 1335   LEUKOCYTESUR NEGATIVE 11/13/2012 1335    Sepsis Labs: Lactic Acid, Venous    Component Value Date/Time   LATICACIDVEN 0.5 11/08/2020 0348    MICROBIOLOGY: Recent Results (from the past 240 hour(s))  Body fluid culture     Status: None   Collection Time: 11/16/20  4:28 PM   Specimen: Pleura; Body Fluid  Result Value Ref Range Status   Specimen Description PLEURAL  Final   Special Requests Pleural R  Final   Gram Stain   Final    RARE WBC PRESENT, PREDOMINANTLY MONONUCLEAR NO ORGANISMS SEEN    Culture   Final    NO GROWTH 3 DAYS Performed at [facility], [address] [place], [place] [address]    Report Status 11/20/2020 FINAL  Final    RADIOLOGY STUDIES/RESULTS: DG Chest 1 View  Result Date: 11/21/2020 CLINICAL DATA:  Pleural effusion. EXAM: CHEST  1 VIEW COMPARISON:  11/20/2020. FINDINGS: Feeding tube, left subclavian line, right IJ line in stable position. Stable cardiomegaly. Progressive dense right base  atelectasis/consolidation. Large progressive right-sided pleural effusion. Previously identified drainage tube over the right chest/upper abdomen not identified on today's exam. Mild right upper chest wall subcutaneous emphysema. IMPRESSION: 1. Progressive dense right base  atelectasis/consolidation. Large progressive right-sided pleural effusion. 2. Previously identified drainage tube over the right chest/upper abdomen not identified on today's exam. 3. Feeding tube and bilateral IJ lines in stable position. 4. Stable cardiomegaly. Electronically Signed   By: Marcello Moores  Register   On: 11/21/2020 06:23   IR US Guide Vasc Access Left  Result Date: 11/20/2020 INDICATION: 59 year old male with a history of malfunctioning left upper extremity fistula. 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 A recent duplex approximately 2-3 weeks prior demonstrates a flow volume of 1816 mL per minute, with no evidence stenosis. Given malfunction, he presents for fistulagram EXAM: ULTRASOUND-GUIDED ACCESS LEFT UPPER EXTREMITY FISTULA BALLOON ANGIOPLASTY OF OUTFLOW STENOSES MEDICATIONS: None. ANESTHESIA/SEDATION: Moderate Sedation Time:  0 minutes The patient was continuously monitored during the procedure by the interventional radiology nurse under my direct supervision. FLUOROSCOPY TIME:  Fluoroscopy Time: 3 minutes 24 seconds (40 mGy). COMPLICATIONS: None PROCEDURE: Informed written consent was obtained from the patient after a thorough discussion of the procedural risks, benefits and alternatives. All questions were addressed. Maximal Sterile Barrier Technique was utilized including caps, mask, sterile gowns, sterile gloves, sterile drape, hand hygiene and skin antiseptic. A timeout was performed prior to the initiation of the procedure. Ultrasound survey was performed with images stored and sent to PACs. The left upper extremity was prepped and draped in the usual sterile fashion. Ultrasound guidance was used to access the left upper extremity graft with a micropuncture kit. Once the 4 French sheath was within the graft, left upper extremity fistulogram was performed. Images were carried out to the central  vasculature. Bentson wire was then placed into the outflow and a 6 French sheath was placed. Combination of Glidewire and the Bentson wire and an angled Kumpe the catheter were used to navigate through the tandem stenoses of the venous outflow. Once the Glidewire and the Kumpe the catheter were through the tandem stenoses, the Bentson wire was placed through the catheter and the catheter was removed. Serial standard balloon angioplasty was then performed at the outflow stenoses first 6 mm then 8 mm. Finally 12 mm balloon angioplasty was performed first at the most central of the stenoses (greater than 95%) and then at the more distal. After balloon angioplasty of venous outflow was performed, balloon was inflated and reflux images of the arterial anastomosis performed. Given the volume measured on the recent duplex of greater than 1800 cc/minute we elected not to puncture in a peripheral directed access. All catheters wires were removed and a suture was placed at the access point for hemostasis. Sterile bandage was placed. Excellent thrill was confirmed at the conclusion. Patient tolerated the procedure well remained hemodynamically stable throughout. No complications were encountered and no significant blood loss. 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 Both of these were treated to 0% residual with standard balloon angioplasty to 12 mm. Excellent thrill upon completion. IMPRESSION: Status post ultrasound guided access left upper extremity fistula for standard balloon angioplasty treatment of tandem high-grade outflow stenoses, with 0% residual after treatment. Signed, Dulcy Fanny. Dellia Nims, RPVI [facility] Radiology ACCESS: Recommend early duplex evaluation of  flow volumes and the arteriovenous anastomosis. This access remains amenable to future  percutaneous interventions as clinically indicated. Electronically Signed   By: Corrie Mckusick D.O.   On: 11/20/2020 15:11   DG CHEST PORT 1 VIEW  Result Date: 11/20/2020 CLINICAL DATA:  Hypoxia. EXAM: PORTABLE CHEST 1 VIEW COMPARISON:  11/19/2020 FINDINGS: 0625 hours. The cardio pericardial silhouette is enlarged. There is pulmonary vascular congestion without overt pulmonary edema. Interval improvement in aeration at the right base with persistent right base collapse/consolidative opacity and pleural fluid. The right pleural drain seen previously has migrated inferiorly and intrathoracic positioning cannot be confirmed. A feeding tube passes into the stomach although the distal tip position is not included on the film. Left subclavian central line is positioned at the innominate vein confluence. Right IJ central line tip overlies the mid to lower right atrium. IMPRESSION: 1. Cardiomegaly with vascular congestion. Interval improvement in aeration at the right base with persistent right base collapse/consolidative opacity and right pleural effusion. 2. Interval migration of right pleural drain, now potentially extra thoracic given the low position. Electronically Signed   By: Misty Stanley M.D.   On: 11/20/2020 06:36   IR AV DIALY SHUNT INTRO NEEDLE/INTRAC INITIAL W/PTA/STENT/IMG LEFT  Result Date: 11/20/2020 INDICATION: 59 year old male with a history of malfunctioning left upper extremity fistula. 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 A recent duplex approximately 2-3 weeks prior demonstrates a flow volume of 1816 mL per minute, with no evidence stenosis. Given malfunction, he presents for fistulagram EXAM: ULTRASOUND-GUIDED ACCESS LEFT UPPER EXTREMITY FISTULA BALLOON ANGIOPLASTY OF OUTFLOW STENOSES MEDICATIONS: None. ANESTHESIA/SEDATION: Moderate Sedation Time:  0 minutes The patient was continuously monitored  during the procedure by the interventional radiology nurse under my direct supervision. FLUOROSCOPY TIME:  Fluoroscopy Time: 3 minutes 24 seconds (40 mGy). COMPLICATIONS: None PROCEDURE: Informed written consent was obtained from the patient after a thorough discussion of the procedural risks, benefits and alternatives. All questions were addressed. Maximal Sterile Barrier Technique was utilized including caps, mask, sterile gowns, sterile gloves, sterile drape, hand hygiene and skin antiseptic. A timeout was performed prior to the initiation of the procedure. Ultrasound survey was performed with images stored and sent to PACs. The left upper extremity was prepped and draped in the usual sterile fashion. Ultrasound guidance was used to access the left upper extremity graft with a micropuncture kit. Once the 4 French sheath was within the graft, left upper extremity fistulogram was performed. Images were carried out to the central vasculature. Bentson wire was then placed into the outflow and a 6 French sheath was placed. Combination of Glidewire and the Bentson wire and an angled Kumpe the catheter were used to navigate through the tandem stenoses of the venous outflow. Once the Glidewire and the Kumpe the catheter were through the tandem stenoses, the Bentson wire was placed through the catheter and the catheter was removed. Serial standard balloon angioplasty was then performed at the outflow stenoses first 6 mm then 8 mm. Finally 12 mm balloon angioplasty was performed first at the most central of the stenoses (greater than 95%) and then at the more distal. After balloon angioplasty of venous outflow was performed, balloon was inflated and reflux images of the arterial anastomosis performed. Given the volume measured on the recent duplex of greater than 1800 cc/minute we elected not to puncture in a peripheral directed access. All catheters wires were removed and a suture was placed  at the access point for  hemostasis. Sterile bandage was placed. Excellent thrill was confirmed at the conclusion. Patient tolerated the procedure well remained hemodynamically stable throughout. No complications were encountered and no significant blood loss. 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 Both of these were treated to 0% residual with standard balloon angioplasty to 12 mm. Excellent thrill upon completion. IMPRESSION: Status post ultrasound guided access left upper extremity fistula for standard balloon angioplasty treatment of tandem high-grade outflow stenoses, with 0% residual after treatment. Signed, Dulcy Fanny. Dellia Nims, RPVI [facility] Radiology ACCESS: Recommend early duplex evaluation of flow volumes and the arteriovenous anastomosis. This access remains amenable to future percutaneous interventions as clinically indicated. Electronically Signed   By: Corrie Mckusick D.O.   On: 11/20/2020 15:11     LOS: 24 days   Oren Binet, MD  [facility]    To contact the attending provider between 7A-7P or the covering provider during after hours 7P-7A, please log into the web site [email or web address] and access using universal [place] password for that web site. If you do not have the password, please call the hospital operator.  11/21/2020, 12:00 PM

## 2020-11-21 NOTE — Progress Notes (Signed)
Inpatient Rehabilitation-Admissions Coordinator   CIR consult received. Attempted to see pt today for rehab assessment. Per RN staff, pt being transferred to [facility]. AC will follow up with pt tomorrow, pending medical stability.   Raechel Ache, OTR/L  Rehab Admissions Coordinator  [phone] 11/21/2020 2:38 PM

## 2020-11-21 NOTE — Progress Notes (Signed)
OT Cancellation Note  Patient Details Name: Travis [name] MRN: [id number] DOB: [birth year]   Cancelled Treatment:    Reason Eval/Treat Not Completed: Medical issues which prohibited therapy Pt being transferred to ICU due to low BP's. Therapy will continue to follow once medically stable.   Corinne Ports E. Brendalee Matthies, COTA/L Acute Rehabilitation Services [place] 11/21/2020, 2:33 PM

## 2020-11-21 NOTE — Progress Notes (Signed)
Pt returned from HD treatment. Could not pull any fluid off r/t blood pressure low.

## 2020-11-21 NOTE — Progress Notes (Signed)
NAME:  Travis [name], MRN:  [id number], DOB:  Jan 27, 1961, LOS: 24 ADMISSION DATE:  10/28/2020, CONSULTATION DATE:  10/29/20 REFERRING MD:  Cardiology - Travis [name], CHIEF COMPLAINT:  Hypotension, gram positive cocci on culture, AMS  Brief History   Travis [name] is a 59 year old man with a history of HTN, ESRD on HD MWF, RUE AV fistula, hx of smoking, admitted 10/28/20 with fevers, myalgias for several days, acute chest pain.    History of present illness   On Admission, found to have STEMI, multivessel disease. Pulm edema with mild hypoxemia.  S/Post PCI to the LAD on 10/29/19. Planning for staged intervention to the left circumflex and possibly right coronary artery.  EF 01-60%, diastolic dysfunction, LVEDP 35. Underwent HD on 11/20 with 4 L volume removed.  Saturation improved after dialysis (100% on RA) Fevers noted 11/20. Blood cultures grew MSSA, noted this evening. .    This evening developed Afib with RVR 180s. Adenosine given, without improvement  Amiodarone infusion started per cardiology, 150mg  bolus, then drip.  Lopressor 5mg IV and 500ccNS given. Started on Phenylephrine, was on 279mcg on my arrival.   Cardioverted emergently at 120J for ongoing afib with hypotension (60/40) and AMS.  Converted to sinus.  Remained moderately hypotensive (MAP 60) on phenylephrine.   Patient was given one dose zosyn, switched to ancef once cultures grew back MSSA.   Cardiac stress tests annually, last done 11/16/19.    Past Medical History  HTN ESRD, HD MWF  Significant Hospital Events   Cardioversion 11/21 Cardiac cath stent to LAD 11/20  Consults:  PCCM  Nephrology ID  Procedures:  Central line 11/21 >> 11/29 Arterial line 11/21 out Repeat DCCV 11/23 11/23 ETT out HD line 11/24 Arterial line 11/24 out [place] CVC 11/30>>   Significant Diagnostic Tests:  11/24 TEE>>Left ventricular ejection fraction, by estimation, is 35 to 40%. The  left ventricle has moderately decreased  function.Moderate to severe mitral regurgitation. 11/23 CT Chest>>Extensive multifocal nodular and patchy airspace disease in both lungs with a slight peripheral predominance in the upper lungs. 3.1 cm nodular consolidative opacity in the right upper lobe is cavitated. Imaging features likely related to multifocal pneumonia. Septic emboli and metastatic disease considered less likely but not excluded. 11/22 LUE Vas Upper Extremity Doppler>> Arteriovenous fistula-Aneurysmal dilatation noted. 11/24 CT abdomen - no retroperitoneal hematoma 11/29 MR Brain>> No evidence acute intracranial abnormality, sinisitis, mastoid effusions 11/29>> MR Cervical Spine>> Given the provided history of bacteremia, facet joint septic arthritis is difficult to definitively exclude, but the lack of any surrounding marrow edema argues against this Cervical spondylosis  11/29 MR Thoracic Spine No significant marrow edema in thoracic spine , Mild thoracic spondylosis hypointense marrow signal throughout the thoracic spine, likely related to the patient's end-stage renal disease. multifocal airspace disease and cavitary pulmonary lesions. Small right pleural effusion. 11/29 MR Lumbar Spine:As noted above and Suspected cholecystolithiasis 12/11 CT chest - improving air space disease - R empyema persists but adequate chest tube position.  Micro Data:  11/21 BCx2>>Staph aureus > MSSA by BCID 11/24 BCx2>> 11/30 BC x 2>> staph epi>> MRSA 11/09/2020 blood cultures x2 no growth negative Antimicrobials:  Zosyn 11/21 x 1 Ancef 11/21 ->  Interim history/subjective:  No events. Wife concerned regarding rectal tube leakage. Denies SOB. Progressive enlarging R effusion. Bps remain borderline.  Objective   Blood pressure (!) 75/51, pulse 96, temperature 99.6 F (37.6 C), temperature source Oral, resp. rate 20, height 5\' 11"  (1.803 m), weight 89.8  kg, SpO2 92 %.        Intake/Output Summary (Last 24 hours) at 11/21/2020  0823 Last data filed at 11/21/2020 0650 Gross per 24 hour  Intake 475 ml  Output 850 ml  Net -375 ml   Filed Weights   11/17/20 1850 11/18/20 0450 11/21/20 0325  Weight: 77.9 kg 83 kg 89.8 kg   Constitutional: frail elderly man more awak then yesterday  Eyes: EOMI, pupils equal Ears, nose, mouth, and throat: MM dry , cortrak in place with trickle feeds ongoing Cardiovascular: RRR, ext warm Respiratory: diminished R base, no accessory muscle use Gastrointestinal: Soft, +BS, rectal tube in place Skin: No rashes, normal turgor, defer to nursing documentation for pressure ulcer Neurologic: moves all 4 ext to command Psychiatric: RASS 0, alert this am  CBC improved Mag slightly low CXR worsening R effusion   Resolved Hospital Problem list     Assessment & Plan:    - Anterior STEMI s/p DES 11/20 trying to get by with brillinta alone - Ischemic cardiomyopathy 40-45% - Afib previously on [facility] held for ongoing bleeding - Recent GIB with recent EGD showing severe esophagitis and duodenal ulceration question ischemic change - MSSA bacteremia thought related to HD catheter on prolonged abx therapy -  loculated R parapneumonic effusion - Unstagable sacral ulcer pressure injury still awaiting PT eval for hydrotherapy - ESRD on HD - Dysphagia -Generalized deconditioning - Hypoglycemia  - Continue trickle tube feeds to supplement PO and reduce hypoglycemic events - Continue qHS seroquel, melatonin, encourage day/night cycles - Abx as ordered for now - iHD per nephrology - Local wound care, turning, rectal tube to try to let sacral ulcer heal - Will place pigtail either bedside or in endo this afternoon for enlarging effusion - Will follow with you  Travis Emery MD PCCM

## 2020-11-21 NOTE — Progress Notes (Signed)
[facility] NEPHROLOGY PROGRESS NOTE  Assessment/ Plan: Pt is a 59 y.o. yo male HTN, ESRD on HD admitted on 11/20 with fever, myalgia found to have NSTEMI, complicated by A. fib with RVR, bacteremia, GI bleed and VDRF.   Physical Exam: General: seen in room, no distress, tired, NG in place Heart:RRR, s1s2 nl Lungs: Basal rhonchi, no wheezing  Abdomen:soft, Non-tender, non-distended, rectal tube in place Extremities:No edema Dialysis Access: TDC+  AV fistula no bruit   OP HD:AF MWF 4h 450/800 81.5kg 2/2.25 bath RUE AVF Hep 5000+ 1000 mid-run - hect 5 ug tiw  - mircera 50 ugIVq 4 weeks, last 11/15 (due 11/29)  Assessment/ Plan:  1. ESRD - usual HD MWF.  Required CRRT on and off from 11/24-12/4.  Tolerating iHD. Plan for AV fistula declot Monday by IR. Unable to use heparin because of GI bleed/anemia. HD Wed if BP's will allow. .  2. BP/volume - BP's low through the day today, have d/w pmd, giving midodrine already, planning for NS bolus(es).  3. Acute STEMI- S/P LAD PCI11/20, has otherdisease LCx/RCA.Transitioned to brilinta from cangrelor. Per cardiology. 4. MSSA cavitary PNA / bacteremia- sp vent support. Blood cx+. Getting 6 wks IV Ancef per pharm ending 12/30/20. F/U bcx's negative. [name] of AVF neg for abscess. 5. Pleural effusion: Chest tube placed , per CCM.  6. PEA arrest: occurred in setting of agitation/ sedation.Brief CPR. 7. Shock - resolved, now on midodrine. 8. Sacral decub - stage IV, mod size, getting local WC 9. Anemia ckd/ABLA -off of anticoagulation.  Received blood transfusion, seen by GI, no plan for endoscopy. 10. Secondary hyperparathyroidism: Increased sevelamer.  Monitor phosphorus level. 11. Afib:on amio and hep gtt, s/p DCCV. 12. Severe MR:per cardiology.  Subjective: Seen in room, pt squirming, per wife can't get comfortable  Objective Vital signs in last 24 hours: Vitals:   11/21/20 0759 11/21/20 1349 11/21/20  1426 11/21/20 1505  BP: (!) 75/51 (!) 78/60 (!) 88/59 (!) 89/59  Pulse: 96 100 94   Resp: 20  20 (!) 23  Temp: 99.6 F (37.6 C)   99.8 F (37.7 C)  TempSrc: Oral   Oral  SpO2: 92% 97% 91% 98%  Weight:      Height:       Weight change:   Intake/Output Summary (Last 24 hours) at 11/21/2020 1534 Last data filed at 11/21/2020 7425 Gross per 24 hour  Intake 478 ml  Output 650 ml  Net -172 ml       Labs: Basic Metabolic Panel: Recent Labs  Lab 11/20/20 0500 11/20/20 2033 11/21/20 1045  NA 130* 128* 132*  K 5.9* 5.6* 4.6  CL 90* 90* 94*  CO2 23 22 28   GLUCOSE 86 91 112*  BUN 121* 143* 64*  CREATININE 10.61* 11.45* 7.07*  CALCIUM 9.5 9.2 9.0  PHOS 7.1* 7.6* 6.3*   Liver Function Tests: Recent Labs  Lab 11/16/20 1628 11/17/20 0556 11/20/20 0500 11/20/20 2033 11/21/20 1045  PROT 5.4*  --   --   --   --   ALBUMIN  --    < > 1.3* 1.3* 1.2*   < > = values in this interval not displayed.   No results for input(s): LIPASE, AMYLASE in the last 168 hours. No results for input(s): AMMONIA in the last 168 hours. CBC: Recent Labs  Lab 11/17/20 0556 11/17/20 2324 11/18/20 0312 11/19/20 0658 11/20/20 2140 11/21/20 0526  WBC 6.9 7.6 8.6 14.0* 13.8* 9.9  NEUTROABS 5.3  --   --   --   --   --  HGB 8.8* 8.8* 8.6* 9.0* 9.2* 8.1*  HCT 27.5* 25.7* 25.7* 27.9* 28.8* 25.4*  MCV 91.7 88.3 88.9 90.0 88.6 90.4  PLT 175 200 224 298 410* 388   Cardiac Enzymes: No results for input(s): CKTOTAL, CKMB, CKMBINDEX, TROPONINI in the last 168 hours. CBG: Recent Labs  Lab 11/21/20 0050 11/21/20 0318 11/21/20 0426 11/21/20 0805 11/21/20 1138  GLUCAP 98 77 100* 123* 124*    Medications: Infusions: . sodium chloride Stopped (11/10/20 1944)  . sodium chloride Stopped (10/28/20 0954)  .  ceFAZolin (ANCEF) IV 2 g (11/17/20 1240)  . norepinephrine (LEVOPHED) Adult infusion 2 mcg/min (11/21/20 1516)    Scheduled Medications: . amiodarone  200 mg Per Tube BID  .  atorvastatin  80 mg Per Tube q1800  . chlorhexidine  15 mL Mouth Rinse BID  . Chlorhexidine Gluconate Cloth  6 each Topical Q0600  . collagenase   Topical Daily  . doxercalciferol  5 mcg Intravenous Q M,W,F  . feeding supplement (NEPRO CARB STEADY)  237 mL Oral BID BM  . feeding supplement (NEPRO CARB STEADY)  480 mL Per Tube Q24H  . guaiFENesin  15 mL Per Tube Q4H  . insulin aspart  0-6 Units Subcutaneous Q4H  . lactobacillus  1 g Per Tube TID WC  . mouth rinse  15 mL Mouth Rinse q12n4p  . melatonin  5 mg Per Tube QHS  . midodrine  10 mg Per Tube TID WC  . multivitamin  1 tablet Per Tube QHS  . nutrition supplement (JUVEN)  1 packet Per Tube BID BM  . pantoprazole sodium  40 mg Per Tube BID   Followed by  . [START ON 12/18/2020] pantoprazole sodium  40 mg Per Tube Daily  . polyethylene glycol  17 g Per Tube Daily  . QUEtiapine  100 mg Per Tube QHS  . sevelamer carbonate  1.6 g Per Tube TID WC  . sodium chloride flush  10 mL Intracatheter Q8H  . sodium chloride flush  10-40 mL Intracatheter Q12H  . sodium chloride flush  3 mL Intravenous Q12H  . sodium zirconium cyclosilicate  10 g Oral BID  . sucralfate  1 g Per Tube QID  . ticagrelor  90 mg Per Tube BID    have reviewed scheduled and prn medications.

## 2020-11-21 NOTE — Progress Notes (Signed)
  Speech Language Pathology Treatment: Dysphagia  Patient Details Name: Travis [name] MRN: [id number] DOB: [birth year] Today's Date: 11/21/2020 Time: 8416-6063 SLP Time Calculation (min) (ACUTE ONLY): 25 min  Assessment / Plan / Recommendation Clinical Impression  Pt ate almost 100% of his breakfast tray this morning and he and his wife deny any difficulties since advancement to thin liquids. Note that his intake has been fluctuating overall, but pt says that he and his wife had a "reset" today and that he is changing his perspective. Pt was very eager to try more advanced solids, and did so in small bites with intermittent throat clearing until SLP also provided liquid washes. Recommend advancing diet to Dys 2 (finely chopped) solids, continuing thin liquids.    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 He describes his chest tightness, 7/10, with radiation to his back (between his scapula), he has not exerted. EKG in ED is suggestive of anterolateral ST elevations. In the ED he was given thorazine for hiccups; CXR 10/31/20 indicated New patchy right mid and lower lung infiltrate; CT chest 10/31/20 yielded results including: Extensive multifocal nodular and patchy airspace disease in both lungs with a slight peripheral predominance in the upper lungs. 3.1 cm nodular consolidative opacity in the right upper lobe is cavitated. Imaging features likely related to multifocal pneumonia. Pt intubated 11/23-12/3/21; gastroenterology consult 11/09/20 with UGI completed and results as follows: Moderately severe reflux and erosive esophagitis  with no bleeding; Hematin (altered blood/coffee-ground-like material) in the gastric antrum, in the cardia, in  the gastric fundus and in the gastric body. Fluid aspiration performed. Probable ischemic duodenitis.  Normal third portion of the duodenum. The examination was otherwise normal. BSE generated.      SLP Plan  Continue with current plan of care       Recommendations  Diet recommendations: Dysphagia 2 (fine chop);Thin liquid Liquids provided via: Cup;Straw Medication Administration: Crushed with puree Supervision: Full supervision/cueing for compensatory strategies;Staff to assist with self feeding;Trained caregiver to feed patient Compensations: Slow rate;Small sips/bites Postural Changes and/or Swallow Maneuvers: Seated upright 90 degrees                Oral Care Recommendations: Oral care QID Follow up Recommendations: 24 hour supervision/assistance;Skilled Nursing facility;Inpatient Rehab SLP Visit Diagnosis: Dysphagia, oropharyngeal phase (R13.12) Plan: Continue with current plan of care       GO                Osie Bond., M.A. [place] Acute Rehabilitation Services Pager [phone] Office [phone]  11/21/2020, 11:27 AM

## 2020-11-21 NOTE — Progress Notes (Signed)
PT Cancellation Note  Patient Details Name: WESLEE FOGG MRN: [id number] DOB: [birth year]   Cancelled Treatment:    Reason Eval/Treat Not Completed: Medical issues which prohibited therapy. Pt being transferred back to ICU due to low BP. Will continue to follow.   Shary Decamp [place] 11/21/2020, 2:19 PM [place] Pager [phone] Office [phone]

## 2020-11-21 NOTE — Consult Note (Signed)
[place] Nurse wound follow up Patient receiving care in [place]. Conferred via Secure Chat with Darcus Pester, PT performing hydrotherapy to buttock wounds.  She recommends continuing the current POC for a bit longer.  Please continue hydrotherapy and enzymatic debridement for now. Val Riles, RN, MSN, CWOCN, CNS-BC, pager [phone]

## 2020-11-21 NOTE — Plan of Care (Signed)
  Problem: Education: Goal: Understanding of CV disease, CV risk reduction, and recovery process will improve Outcome: Progressing   Problem: Activity: Goal: Ability to return to baseline activity level will improve Outcome: Progressing   Problem: Cardiovascular: Goal: Ability to achieve and maintain adequate cardiovascular perfusion will improve Outcome: Progressing   Problem: Health Behavior/Discharge Planning: Goal: Ability to safely manage health-related needs after discharge will improve Outcome: Progressing   Problem: Education: Goal: Knowledge of General Education information will improve Description: Including pain rating scale, medication(s)/side effects and non-pharmacologic comfort measures Outcome: Progressing   Problem: Health Behavior/Discharge Planning: Goal: Ability to manage health-related needs will improve Outcome: Progressing   Problem: Clinical Measurements: Goal: Ability to maintain clinical measurements within normal limits will improve Outcome: Progressing Goal: Will remain free from infection Outcome: Progressing Goal: Diagnostic test results will improve Outcome: Progressing Goal: Respiratory complications will improve Outcome: Progressing Goal: Cardiovascular complication will be avoided Outcome: Progressing   Problem: Activity: Goal: Risk for activity intolerance will decrease Outcome: Progressing   Problem: Nutrition: Goal: Adequate nutrition will be maintained Outcome: Progressing   Problem: Coping: Goal: Level of anxiety will decrease Outcome: Progressing   Problem: Elimination: Goal: Will not experience complications related to bowel motility Outcome: Progressing   Problem: Pain Managment: Goal: General experience of comfort will improve Outcome: Progressing   Problem: Safety: Goal: Ability to remain free from injury will improve Outcome: Progressing   Problem: Skin Integrity: Goal: Risk for impaired skin integrity will  decrease Outcome: Progressing

## 2020-11-22 ENCOUNTER — Inpatient Hospital Stay (HOSPITAL_COMMUNITY): Payer: Medicare Other

## 2020-11-22 DIAGNOSIS — Z9689 Presence of other specified functional implants: Secondary | ICD-10-CM

## 2020-11-22 DIAGNOSIS — J869 Pyothorax without fistula: Secondary | ICD-10-CM

## 2020-11-22 LAB — RENAL FUNCTION PANEL
Albumin: 1.2 g/dL — ABNORMAL LOW (ref 3.5–5.0)
Anion gap: 12 (ref 5–15)
BUN: 76 mg/dL — ABNORMAL HIGH (ref 6–20)
CO2: 25 mmol/L (ref 22–32)
Calcium: 9 mg/dL (ref 8.9–10.3)
Chloride: 93 mmol/L — ABNORMAL LOW (ref 98–111)
Creatinine, Ser: 8.08 mg/dL — ABNORMAL HIGH (ref 0.61–1.24)
GFR, Estimated: 7 mL/min — ABNORMAL LOW (ref >60)
Glucose, Bld: 90 mg/dL (ref 70–99)
Phosphorus: 5.4 mg/dL — ABNORMAL HIGH (ref 2.5–4.6)
Potassium: 4.6 mmol/L (ref 3.5–5.1)
Sodium: 130 mmol/L — ABNORMAL LOW (ref 135–145)

## 2020-11-22 LAB — CBC
HCT: 23.3 % — ABNORMAL LOW (ref 39.0–52.0)
Hemoglobin: 7.5 g/dL — ABNORMAL LOW (ref 13.0–17.0)
MCH: 29.5 pg (ref 26.0–34.0)
MCHC: 32.2 g/dL (ref 30.0–36.0)
MCV: 91.7 fL (ref 80.0–100.0)
Platelets: 433 10*3/uL — ABNORMAL HIGH (ref 150–400)
RBC: 2.54 MIL/uL — ABNORMAL LOW (ref 4.22–5.81)
RDW: 14.7 % (ref 11.5–15.5)
WBC: 10.5 10*3/uL (ref 4.0–10.5)
nRBC: 0 % (ref 0.0–0.2)

## 2020-11-22 LAB — HEPATIC FUNCTION PANEL
ALT: 6 U/L (ref 0–44)
AST: 18 U/L (ref 15–41)
Albumin: 1.2 g/dL — ABNORMAL LOW (ref 3.5–5.0)
Alkaline Phosphatase: 98 U/L (ref 38–126)
Bilirubin, Direct: 0.2 mg/dL (ref 0.0–0.2)
Indirect Bilirubin: 0.8 mg/dL (ref 0.3–0.9)
Total Bilirubin: 1 mg/dL (ref 0.3–1.2)
Total Protein: 5.1 g/dL — ABNORMAL LOW (ref 6.5–8.1)

## 2020-11-22 LAB — GLUCOSE, CAPILLARY
Glucose-Capillary: 83 mg/dL (ref 70–99)
Glucose-Capillary: 86 mg/dL (ref 70–99)
Glucose-Capillary: 88 mg/dL (ref 70–99)
Glucose-Capillary: 92 mg/dL (ref 70–99)
Glucose-Capillary: 96 mg/dL (ref 70–99)
Glucose-Capillary: 99 mg/dL (ref 70–99)

## 2020-11-22 LAB — MAGNESIUM: Magnesium: 2.7 mg/dL — ABNORMAL HIGH (ref 1.7–2.4)

## 2020-11-22 MED ORDER — MIDODRINE HCL 5 MG PO TABS
20.0000 mg | ORAL_TABLET | Freq: Three times a day (TID) | ORAL | Status: DC
  Administered 2020-11-22 – 2020-11-24 (×8): 20 mg
  Filled 2020-11-22 (×8): qty 4

## 2020-11-22 MED ORDER — SILVER NITRATE-POT NITRATE 75-25 % EX MISC
10.0000 | CUTANEOUS | Status: DC | PRN
  Filled 2020-11-22: qty 10

## 2020-11-22 NOTE — Progress Notes (Signed)
Physical Therapy Treatment Patient Details Name: Travis [name] MRN: [id number] DOB: [birth year] Today's Date: 11/22/2020    History of Present Illness Pt is 59 y.o. male with past medical history of end-stage renal disease on hemodialysis, essential hypertension, dilated ascending aorta and hyperlipidemia who presented on November 20 with anterior ST elevation myocardial infarction.  VDRF 11/23-12/3.  Stent placed 11/20.  Afib with RVR with cardioversion 11/21. Suspected cholecystolithiasis 11/29.    PT Comments    Pt admitted with above diagnosis. Pt was able to stand and perform standing activities multiple x during session with +2 min to mod assist. Pt progressing and was very participatory today. Pt assisted back to bed and positioned well due to buttock wound. Will continue to follow acutely  Pt currently with functional limitations due to balance and endurance deficits. Pt will benefit from skilled PT to increase their independence and safety with mobility to allow discharge to the venue listed below.     Follow Up Recommendations  CIR     Equipment Recommendations  Wheelchair (measurements PT);Wheelchair cushion (measurements PT);3in1 (PT)    Recommendations for Other Services       Precautions / Restrictions Precautions Precautions: Fall Restrictions Weight Bearing Restrictions: No Other Position/Activity Restrictions: watch BP    Mobility  Bed Mobility Overal bed mobility: Needs Assistance Bed Mobility: Rolling;Supine to Sit;Sit to Supine Rolling: Min guard Sidelying to sit: Mod assist;+2 for physical assistance Supine to sit: Mod assist;+2 for physical assistance     General bed mobility comments: Supine to sit: pt able to assist with legs to EOB and the pulled up on therapist hand with mod A for trunk.  Sit to supine with mod assist and rolled to his left side quickly as his buttocks hurts.  Transfers Overall transfer level: Needs assistance Equipment used:  Rolling walker (2 wheeled);1 person hand held assist Transfers: Sit to/from Stand Sit to Stand: Mod assist;Min assist;+2 physical assistance         General transfer comment: Mod A initially progressing to Min A wtih 3 stands to RW.  Stood 1 min ute, then 1 1/2 min then side stepped to [facility] with +2 min assist and mod cues.  Varying assist.  Marched in place as well  Ambulation/Gait Ambulation/Gait assistance: Mod assist;+2 physical assistance Gait Distance (Feet): 3 Feet Assistive device: Rolling walker (2 wheeled) Gait Pattern/deviations: Step-to pattern;Decreased stride length;Shuffle;Drifts right/left;Leaning posteriorly Gait velocity: decreased Gait velocity interpretation: <1.31 ft/sec, indicative of household ambulator General Gait Details: Pt took 3 steps forward and 3 steps back x 2 x with pt able to advance RW and min assist for knee instability.  Noted no buckling today.  Cued/faciliated posture, assist for balance, and cued for RW use.   Stairs             Wheelchair Mobility    Modified Rankin (Stroke Patients Only)       Balance Overall balance assessment: Needs assistance Sitting-balance support: No upper extremity supported;Feet supported Sitting balance-Leahy Scale: Fair Sitting balance - Comments: Pt sitting EOB for at least 20 mins.  Able to maintain balance with close supervision/min guard. Postural control:  (sitting EoB, requires maximal assist for balance to keep off sacral wound) Standing balance support: Bilateral upper extremity supported Standing balance-Leahy Scale: Poor Standing balance comment: Requiring use of UE on RW and +2 min to mod assist.  Pt stood multiple times throughout session for up to 1 1/2 min.  S  Cognition Arousal/Alertness: Awake/alert Behavior During Therapy: Impulsive Overall Cognitive Status: Impaired/Different from baseline Area of Impairment: Memory;Following  commands;Safety/judgement;Awareness;Problem solving;Orientation;Attention                 Orientation Level: Disoriented to;Time;Situation;Place Current Attention Level: Sustained Memory: Decreased short-term memory Following Commands: Follows one step commands inconsistently Safety/Judgement: Decreased awareness of safety;Decreased awareness of deficits Awareness: Intellectual Problem Solving: Difficulty sequencing;Requires verbal cues;Requires tactile cues;Decreased initiation;Slow processing General Comments: Pt impulsive and with decreased awareness of deficits.      Exercises General Exercises - Lower Extremity Ankle Circles/Pumps: AROM;Both;10 reps;Seated Long Arc Quad: AROM;10 reps;Seated;Both Hip Flexion/Marching: Both;10 reps;AROM;Seated    General Comments General comments (skin integrity, edema, etc.): HR 91-121 bpm, 98% on3LO2, Dizzy wiht standing but BP 127/68 during session and post session, 115/65.      Pertinent Vitals/Pain Pain Assessment: Faces Faces Pain Scale: Hurts whole lot Pain Location: bottom Pain Descriptors / Indicators: Discomfort;Sore Pain Intervention(s): Limited activity within patient's tolerance;Monitored during session;Repositioned    Home Living                      Prior Function            PT Goals (current goals can now be found in the care plan section) Acute Rehab PT Goals Patient Stated Goal: Move better and get stronger Progress towards PT goals: Progressing toward goals    Frequency    Min 3X/week      PT Plan Current plan remains appropriate    Co-evaluation PT/OT/SLP Co-Evaluation/Treatment: Yes Reason for Co-Treatment: Complexity of the patient's impairments (multi-system involvement);For patient/therapist safety PT goals addressed during session: Mobility/safety with mobility OT goals addressed during session: ADL's and self-care;Other (comment) (toilet transfers)      AM-PAC PT "6 Clicks"  Mobility   Outcome Measure  Help needed turning from your back to your side while in a flat bed without using bedrails?: None Help needed moving from lying on your back to sitting on the side of a flat bed without using bedrails?: A Lot Help needed moving to and from a bed to a chair (including a wheelchair)?: A Lot Help needed standing up from a chair using your arms (e.g., wheelchair or bedside chair)?: A Lot Help needed to walk in hospital room?: A Lot Help needed climbing 3-5 steps with a railing? : Total 6 Click Score: 13    End of Session Equipment Utilized During Treatment: Oxygen;Gait belt Activity Tolerance: Patient tolerated treatment well Patient left: in bed;with call bell/phone within reach;with family/visitor present Nurse Communication: Mobility status PT Visit Diagnosis: Muscle weakness (generalized) (M62.81)     Time: 7619-5093 PT Time Calculation (min) (ACUTE ONLY): 41 min  Charges:  $Therapeutic Exercise: 8-22 mins $Therapeutic Activity: 8-22 mins                     Deval Mroczka W,PT Acute Rehabilitation Services Pager:  [phone]  Office:  [place] 11/22/2020, 2:01 PM

## 2020-11-22 NOTE — PMR Pre-admission (Shared)
PMR Admission Coordinator Pre-Admission Assessment  Patient: Travis [name] is an 59 y.o., male MRN: [id number] DOB: [birth year] Height: 5' 11"  (180.3 cm) Weight: 83.1 kg  Insurance Information HMO:     PPO:      PCP:      IPA:      80/20: yes     OTHER:  PRIMARY: Medicare A and B      Policy#: [id number]      Subscriber: patient CM Name:       Phone#:      Fax#:  Pre-Cert#:       Employer:  Benefits:  Phone #: online     Name: verified eligibility via [place] on 11/24/20  Eff. Date: Part A effective 12/10/19-12/08/20     Deduct: $1,484      Out of Pocket Max:       Life Max:  CIR: Covered Per Medicare guidelines once yearly deductible has been met      SNF: days 1-20, 100%, days 21-100, 80% Outpatient: 80%     Co-Pay: 20% Home Health: 100%      Co-Pay:  DME: 80%     Co-Pay: 20% Providers: Pt's choice SECONDARY: None      Policy#:      Phone#:   Development worker, community:       Phone#:   The Engineer, [employer]" for patients in Inpatient Rehabilitation Facilities with attached "Privacy Act [place] Records" was provided and verbally reviewed with: {CHL IP Patient Family LA:[id number]}  Emergency Contact Information Contact Information    Name Relation Home Work Mobile   [place] Spouse [phone]  [phone]      Current Medical History  Patient Admitting Diagnosis: Debility   History of Present Illness:Pt is a 59 yo Males with history of ESRD, HTN, and CAD who presented to [facility] on 10/28/20 with an anterior STEMI. He was treated with DES to LAD but had multiple complications during his hospital course-including atrial fibrillation with RVR requirning cardioversion x2, septic shock due to MSSA bacteremia, brief PEA arrest, acute hypoxic respiratory failure with intubation, upper GI bleed with acute blood loss anemia due to severe esophagitis, severe metabolic encephalopathy/ICU delirium, bilateral lung abscesses with empyema needing chest tube  insertion, right sided pleural effusion/empyema, volume overloaded requiring reintubation, and continued delirium.   There are future plans for RCA PCI but are on hold until pt is more stable. Pt has also developled a pressure wound to his sacral area that is unstagable with full thickness tissue loss. On 12/31, pt developled worsening AMS with increase in BUN/Cr. Suspect uremia as cause. Pt has tolerated increased time up in recliner and is recommended for CIR due to functional decline and debility. Pt is to admit to CIR on ***.     Patient's medical record from [facility] has been reviewed by the rehabilitation admission coordinator and physician.  Past Medical History  Past Medical History:  Diagnosis Date  . Acute ST elevation myocardial infarction (STEMI) of anterolateral wall ([place]) 10/28/2020   Culprit lesion, 99% ulcerated mid LAD at D1 -> DES PCI  . Anemia of chronic disease 01/30/2015  . Coronary artery disease involving native coronary artery of native heart with unstable angina pectoris ([place]) 10/28/2020   Acute anterolateral STEMI: 3V CAD: Culprit Lesion = 99% mid LAD lesion (just after D1) - DES PCI; 99% ostial AV groove LCx; & 80% calcified napkin ring proximal RCA lesion with extensive calcification throughout the RCA. Successful PTCA and  DES PCI of the LAD crossing D1 -resolute Onyx DES 2.75 mm x 18 mm postdilated to 3.1 mm. With PCI, there  . End-stage renal disease on hemodialysis ([place])   . GERD (gastroesophageal reflux disease)    takes Nexium  . Hyperlipidemia with target LDL less than 70 10/28/2020  . Hypertension   . Pneumonia    as a child  . Presence of drug coated stent in LAD coronary artery 10/28/2020   Proximal-mid LAD 99% ->0% -> Resolute Onyx DES 2.75 mm x 18 mm (3.1 mm)  . Sleep apnea    uses c-pap 2 yrs    Family History   family history includes Hypertension in his father, mother, and sister.  Prior Rehab/Hospitalizations Has the patient  had prior rehab or hospitalizations prior to admission? No  Has the patient had major surgery during 100 days prior to admission? Yes   Current Medications  Current Facility-Administered Medications:  .  0.9 %  sodium chloride infusion, , Intravenous, Continuous, Agarwala, Ravi, MD, Last Rate: 10 mL/hr at 11/21/20 1722, 10 mL/hr at 11/21/20 1722 .  0.9 %  sodium chloride infusion, 250 mL, Intravenous, PRN, Kipp Brood, MD, Stopped at 10/28/20 0954 .  acetaminophen (TYLENOL) tablet 650 mg, 650 mg, Per Tube, Q4H PRN, Kipp Brood, MD, 650 mg at 11/20/20 0022 .  amiodarone (PACERONE) tablet 200 mg, 200 mg, Per Tube, BID, Reino Bellis B, NP, 200 mg at 11/22/20 0939 .  atorvastatin (LIPITOR) tablet 80 mg, 80 mg, Per Tube, q1800, Kipp Brood, MD, 80 mg at 11/21/20 1651 .  ceFAZolin (ANCEF) IVPB 2g/100 mL premix, 2 g, Intravenous, Q M,W,F-HD, Kipp Brood, MD, Held at 11/22/20 1221 .  chlorhexidine (PERIDEX) 0.12 % solution 15 mL, 15 mL, Mouth Rinse, BID, Agarwala, Ravi, MD, 15 mL at 11/22/20 0942 .  Chlorhexidine Gluconate Cloth 2 % PADS 6 each, 6 each, Topical, Q0600, Rosita Fire, MD, 6 each at 11/21/20 0532 .  clonazePAM (KLONOPIN) disintegrating tablet 0.5 mg, 0.5 mg, Per Tube, BID PRN, Ghimire, Henreitta Leber, MD .  collagenase (SANTYL) ointment, , Topical, Daily, Kipp Brood, MD, 1 application at 09/98/33 1034 .  doxercalciferol (HECTOROL) injection 5 mcg, 5 mcg, Intravenous, Q M,W,F, Agarwala, Ravi, MD, 5 mcg at 11/17/20 1057 .  feeding supplement (NEPRO CARB STEADY) liquid 1,000 mL, 1,000 mL, Per Tube, Q24H, Ghimire, Henreitta Leber, MD, Last Rate: 20 mL/hr at 11/21/20 2136, 1,000 mL at 11/21/20 2136 .  feeding supplement (NEPRO CARB STEADY) liquid 237 mL, 237 mL, Oral, BID BM, Ghimire, Henreitta Leber, MD, 237 mL at 11/22/20 1452 .  guaiFENesin (ROBITUSSIN) 100 MG/5ML solution 300 mg, 15 mL, Per Tube, Q4H, Ghimire, Shanker M, MD, 300 mg at 11/22/20 1453 .  heparin injection 5,000  Units, 5,000 Units, Subcutaneous, Q8H, Agarwala, Ravi, MD, 5,000 Units at 11/22/20 1453 .  insulin aspart (novoLOG) injection 0-6 Units, 0-6 Units, Subcutaneous, Q4H, Agarwala, Ravi, MD, 1 Units at 11/08/20 2349 .  lactobacillus (FLORANEX/LACTINEX) granules 1 g, 1 g, Per Tube, TID WC, Agarwala, Ravi, MD, 1 g at 11/22/20 1226 .  MEDLINE mouth rinse, 15 mL, Mouth Rinse, q12n4p, Agarwala, Ravi, MD, 15 mL at 11/22/20 1234 .  melatonin tablet 5 mg, 5 mg, Per Tube, QHS, Ghimire, Henreitta Leber, MD, 5 mg at 11/21/20 2119 .  midodrine (PROAMATINE) tablet 20 mg, 20 mg, Per Tube, TID WC, Chand, [place]ham, MD, 20 mg at 11/22/20 1225 .  multivitamin (RENA-VIT) tablet 1 tablet, 1 tablet, Per Tube, QHS, [name], RPH,  1 tablet at 11/21/20 2119 .  norepinephrine (LEVOPHED) 46m in 2541mpremix infusion, 0-40 mcg/min, Intravenous, Titrated, SmCandee FurbishMD, Last Rate: 7.5 mL/hr at 11/22/20 1200, 2 mcg/min at 11/22/20 1200 .  nutrition supplement (JUVEN) (JUVEN) powder packet 1 packet, 1 packet, Per Tube, BID BM, GhJonetta OsgoodMD, 1 packet at 11/22/20 1453 .  ondansetron (ZOFRAN) injection 4 mg, 4 mg, Intravenous, Q6H PRN, Agarwala, Ravi, MD, 4 mg at 11/20/20 1340 .  oxyCODONE (Oxy IR/ROXICODONE) immediate release tablet 10 mg, 10 mg, Per Tube, Q6H PRN, AgKipp BroodMD, 10 mg at 11/22/20 1359 .  pantoprazole sodium (PROTONIX) 40 mg/20 mL oral suspension 40 mg, 40 mg, Per Tube, BID, 40 mg at 11/22/20 0942 **FOLLOWED BY** [START ON 12/18/2020] pantoprazole sodium (PROTONIX) 40 mg/20 mL oral suspension 40 mg, 40 mg, Per Tube, Daily, Agarwala, Ravi, MD .  polyethylene glycol (MIRALAX / GLYCOLAX) packet 17 g, 17 g, Per Tube, Daily, Ghimire, ShHenreitta LeberMD, 17 g at 11/22/20 0908 .  QUEtiapine (SEROQUEL) tablet 100 mg, 100 mg, Per Tube, QHS, SmCandee FurbishMD, 100 mg at 11/21/20 2119 .  Resource ThickenUp Clear, , Oral, PRN, AgKipp BroodMD .  sennosides (SENOKOT) 8.8 MG/5ML syrup 10 mL, 10 mL, Per Tube,  Daily PRN, Ghimire, ShHenreitta LeberMD .  sevelamer carbonate (RENVELA) packet 1.6 g, 1.6 g, Per Tube, TID WC, Agarwala, Ravi, MD, 1.6 g at 11/22/20 1226 .  silver nitrate applicators applicator 10 Stick, 10 Stick, Topical, PRN, ChJacky KindleMD .  sodium chloride (OCEAN) 0.65 % nasal spray 1 spray, 1 spray, Each Nare, PRN, AgKipp BroodMD, 1 spray at 11/15/20 1830 .  sodium chloride flush (NS) 0.9 % injection 10 mL, 10 mL, Intracatheter, Q8H, Agarwala, Ravi, MD, 10 mL at 11/22/20 1000 .  sodium chloride flush (NS) 0.9 % injection 10 mL, 10 mL, Intracatheter, Q8H, BaSalvadore Dom, NP, 10 mL at 11/22/20 1000 .  sodium chloride flush (NS) 0.9 % injection 10-40 mL, 10-40 mL, Intracatheter, Q12H, Agarwala, Ravi, MD, 10 mL at 11/22/20 1000 .  sodium chloride flush (NS) 0.9 % injection 10-40 mL, 10-40 mL, Intracatheter, PRN, Agarwala, Ravi, MD .  sodium chloride flush (NS) 0.9 % injection 3 mL, 3 mL, Intravenous, Q12H, Agarwala, Ravi, MD, 3 mL at 11/22/20 1000 .  sodium chloride flush (NS) 0.9 % injection 3 mL, 3 mL, Intravenous, PRN, AgKipp BroodMD, 3 mL at 11/17/20 2128 .  sodium zirconium cyclosilicate (LOKELMA) packet 10 g, 10 g, Oral, BID, Ghimire, ShHenreitta LeberMD, 10 g at 11/22/20 1147 .  sucralfate (CARAFATE) 1 GM/10ML suspension 1 g, 1 g, Per Tube, QID, Agarwala, Ravi, MD, 1 g at 11/22/20 1453 .  ticagrelor (BRILINTA) tablet 90 mg, 90 mg, Per Tube, BID, Agarwala, Ravi, MD, 90 mg at 11/22/20 097062Patients Current Diet:  Diet Order            DIET DYS 2 Room service appropriate? Yes with Assist; Fluid consistency: Thin  Diet effective now                 Precautions / Restrictions Precautions Precautions: Fall Restrictions Weight Bearing Restrictions: No RLE Weight Bearing: Non weight bearing LLE Weight Bearing: Non weight bearing Other Position/Activity Restrictions: watch BP   Has the patient had 2 or more falls or a fall with injury in the past year? No  Prior Activity  Level Community (5-7x/wk): on disability but worked in [employer] and independent PTA.  Prior Functional Level Self Care: Did the patient need help bathing, dressing, using the toilet or eating? Independent  Indoor Mobility: Did the patient need assistance with walking from room to room (with or without device)? Independent  Stairs: Did the patient need assistance with internal or external stairs (with or without device)? Independent  Functional Cognition: Did the patient need help planning regular tasks such as shopping or remembering to take medications? Independent  Home Assistive Devices / Equipment Home Assistive Devices/Equipment: None Home Equipment: None  Prior Device Use: Indicate devices/aids used by the patient prior to current illness, exacerbation or injury? None of the above  Current Functional Level Cognition  Overall Cognitive Status: Impaired/Different from baseline Current Attention Level: Sustained Orientation Level: Oriented X4 Following Commands: Follows one step commands inconsistently Safety/Judgement: Decreased awareness of safety,Decreased awareness of deficits General Comments: Pt impulsive and with decreased awareness of deficits.    Extremity Assessment (includes Sensation/Coordination)  Upper Extremity Assessment: RUE deficits/detail,LUE deficits/detail RUE Deficits / Details: weakness btoh gross and grasp. Performing AAROM for shoulder flexion and elbow flexion/extenion. Able to perform composite flexion/exention.  RUE Coordination: decreased fine motor,decreased gross motor LUE Deficits / Details: L with more strength than R. weakness btoh gross and grasp. Performing AAROM for shoulder flexion and elbow flexion/extenion. Able to perform composite flexion/exention.  LUE Coordination: decreased fine motor,decreased gross motor  Lower Extremity Assessment: Defer to PT evaluation RLE Deficits / Details: grossly 2-/5 LLE Deficits /  Details: grossly 2-/5    ADLs  Overall ADL's : Needs assistance/impaired Eating/Feeding: NPO Grooming: Maximal assistance,Bed level Grooming Details (indicate cue type and reason): Max hand over hand Upper Body Bathing: Maximal assistance,Bed level,Sitting Lower Body Bathing: Total assistance,Bed level Upper Body Dressing : Maximal assistance,Sitting,Bed level Lower Body Dressing: Total assistance,Bed level General ADL Comments: Pt with decreased strength, sitting balance, cogntiion, and activity tolerance. Pt tolerating sitting at EOB for ~5 minutes. Limited by pain and fatigue    Mobility  Overal bed mobility: Needs Assistance Bed Mobility: Rolling,Supine to Sit,Sit to Supine Rolling: Min guard Sidelying to sit: Mod assist,+2 for physical assistance Supine to sit: Mod assist,+2 for physical assistance Sit to sidelying: Total assist General bed mobility comments: Supine to sit: pt able to assist with legs to EOB and the pulled up on therapist hand with mod A for trunk.  Sit to supine with mod assist and rolled to his left side quickly as his buttocks hurts.    Transfers  Overall transfer level: Needs assistance Equipment used: Rolling walker (2 wheeled),1 person hand held assist Transfer via Lift Equipment: Maximove Transfers: Sit to/from Stand Sit to Stand: Mod assist,Min assist,+2 physical assistance General transfer comment: Mod A initially progressing to Min A wtih 3 stands to RW.  Stood 1 min ute, then 1 1/2 min then side stepped to [facility] with +2 min assist and mod cues.  Varying assist.  Marched in place as well    Ambulation / Gait / Stairs / Wheelchair Mobility  Ambulation/Gait Ambulation/Gait assistance: Mod assist,+2 physical assistance Gait Distance (Feet): 3 Feet Assistive device: Rolling walker (2 wheeled) Gait Pattern/deviations: Step-to pattern,Decreased stride length,Shuffle,Drifts right/left,Leaning posteriorly General Gait Details: Pt took 3 steps forward and 3  steps back x 2 x with pt able to advance RW and min assist for knee instability.  Noted no buckling today.  Cued/faciliated posture, assist for balance, and cued for RW use. Gait velocity: decreased Gait velocity interpretation: <1.31 ft/sec, indicative of household ambulator    Posture / Balance  Dynamic Sitting Balance Sitting balance - Comments: Pt sitting EOB for at least 20 mins.  Able to maintain balance with close supervision/min guard. Balance Overall balance assessment: Needs assistance Sitting-balance support: No upper extremity supported,Feet supported Sitting balance-Leahy Scale: Fair Sitting balance - Comments: Pt sitting EOB for at least 20 mins.  Able to maintain balance with close supervision/min guard. Postural control:  (sitting EoB, requires maximal assist for balance to keep off sacral wound) Standing balance support: Bilateral upper extremity supported Standing balance-Leahy Scale: Poor Standing balance comment: Requiring use of UE on RW and +2 min to mod assist.  Pt stood multiple times throughout session for up to 1 1/2 min.  S    Special needs/care consideration {Special Care Needs/Care Considerations:[id number]}   Previous Home Environment (from acute therapy documentation) Living Arrangements: Spouse/significant other Available Help at Discharge: Family,Available 24 hours/day Type of Home: House Home Layout: One level Home Access: Stairs to enter Entrance Stairs-Rails: None Entrance Stairs-Number of Steps: 1 Bathroom Shower/Tub: Chiropodist: Handicapped [place]: No  Discharge Living Setting Plans for Discharge Living Setting: House (lives with wife) Type of Home at Discharge: House Discharge Home Layout: One level Discharge Home Access: Level entry Discharge Bathroom Shower/Tub: Tub/shower unit Discharge Bathroom Toilet: Handicapped height Discharge Bathroom Accessibility: Yes How Accessible: Accessible via  walker Does the patient have any problems obtaining your medications?: No  Social/Family/Support Systems Patient Roles: Spouse Contact Information: wife: Juliene Pina [phone] Anticipated Caregiver: Myra  Anticipated Caregiver's Contact Information: see above Ability/Limitations of Caregiver: Min A Caregiver Availability:  (24/7) Discharge Plan Discussed with Primary Caregiver:  (yes, pt and wife) Is Caregiver In Agreement with Plan?: Yes (pt and wife) Does Caregiver/Family have Issues with Lodging/Transportation while Pt is in Rehab?: No  Goals Patient/Family Goal for Rehab: PT/OT: Supervision; SLP: Supervision Expected length of stay: 18-22 days Pt/Family Agrees to Admission and willing to participate: Yes Program Orientation Provided & Reviewed with Pt/Caregiver Including Roles  & Responsibilities: Yes (pt and wife)  Barriers to Discharge: Medical stability,Hemodialysis,New oxygen,Nutrition means  Barriers to Discharge Comments: ***  Decrease burden of Care through IP rehab admission: NA  Possible need for SNF placement upon discharge: Not anticipated. Pt has a very supportive wife who can assist at DC. Anticipate pt can return to supervision level at DC.   Patient Condition: I have reviewed medical records from [facility], spoken with MD, and patient and spouse. I met with patient at the bedside for inpatient rehabilitation assessment.  Patient will benefit from ongoing PT, OT and SLP, can actively participate in 3 hours of therapy a day 5 days of the week, and can make measurable gains during the admission.  Patient will also benefit from the coordinated team approach during an Inpatient Acute Rehabilitation admission.  The patient will receive intensive therapy as well as Rehabilitation physician, nursing, social worker, and care management interventions.  Due to safety, skin/wound care, disease management, medication administration, pain management and patient education  the patient requires 24 hour a day rehabilitation nursing.  The patient is currently *** with mobility and basic ADLs.  Discharge setting and therapy post discharge at home with home health is anticipated.  Patient has agreed to participate in the Acute Inpatient Rehabilitation Program and will admit ***.  Preadmission Screen Completed By:  Raechel Ache, 11/22/2020 4:07 PM ______________________________________________________________________   Discussed status with Dr. Marland Kitchen on *** at *** and received approval for admission today.  Admission Coordinator:  Raechel Ache, OT,  time ***/Date ***   Assessment/Plan: Diagnosis: 1. Does the need for close, 24 hr/day Medical supervision in concert with the patient's rehab needs make it unreasonable for this patient to be served in a less intensive setting? {yes_no_potentially:3041433} 2. Co-Morbidities requiring supervision/potential complications: *** 3. Due to {due CV:0131438}, does the patient require 24 hr/day rehab nursing? {yes_no_potentially:3041433} 4. Does the patient require coordinated care of a physician, rehab nurse, PT, OT, and SLP to address physical and functional deficits in the context of the above medical diagnosis(es)? {yes_no_potentially:3041433} Addressing deficits in the following areas: {deficits:3041436} 5. Can the patient actively participate in an intensive therapy program of at least 3 hrs of therapy 5 days a week? {yes_no_potentially:3041433} 6. The potential for patient to make measurable gains while on inpatient rehab is {potential:3041437} 7. Anticipated functional outcomes upon discharge from inpatient rehab: {functional outcomes:304600100} PT, {functional outcomes:304600100} OT, {functional outcomes:304600100} SLP 8. Estimated rehab length of stay to reach the above functional goals is: *** 9. Anticipated discharge destination: {anticipated dc setting:21604} 10. Overall Rehab/Functional Prognosis:  {potential:3041437}   MD Signature: ***

## 2020-11-22 NOTE — Progress Notes (Signed)
D/w Dr Amada Kingfisher will take over as primary-hospitalist will plan on resuming care when stable to be transferred out of the ICU.  Please let our service know when patient is stable to be transferred out of the ICU.

## 2020-11-22 NOTE — Procedures (Signed)
Insertion of Chest Tube Procedure Note  DAMARCUS REGGIO  [id number]  Jun 29, 1961  Date:11/22/20  Time:10:34 AM    Provider Performing: Clementeen Graham   Procedure: Chest Tube Insertion [phone])  Indication(s) Effusion  Consent Risks of the procedure as well as the alternatives and risks of each were explained to the patient and/or caregiver.  Consent for the procedure was obtained and is signed in the bedside chart  Anesthesia Topical only with 1% lidocaine    Time Out Verified patient identification, verified procedure, site/side was marked, verified correct patient position, special equipment/implants available, medications/allergies/relevant history reviewed, required imaging and test results available.   Sterile Technique Maximal sterile technique including full sterile barrier drape, hand hygiene, sterile gown, sterile gloves, mask, hair covering, sterile ultrasound probe cover (if used).   Procedure Description Ultrasound used to identify appropriate pleural anatomy for placement and overlying skin marked. Area of placement cleaned and draped in sterile fashion.  A 14  French pigtail pleural catheter was placed into the right pleural space using Seldinger technique. Appropriate return of fluid was obtained.  The tube was connected to atrium and placed on -20 cm H2O wall suction.   Complications/Tolerance None; patient tolerated the procedure well. Chest X-ray is ordered to verify placement.   EBL Minimal  Specimen(s) none  Erick Colace ACNP-BC [place] Pager # [phone] OR # [phone] if no answer

## 2020-11-22 NOTE — Progress Notes (Signed)
[facility] NEPHROLOGY PROGRESS NOTE  Assessment/ Plan: Pt is a 59 y.o. yo male HTN, ESRD on HD admitted on 11/20 with fever, myalgia found to have NSTEMI, complicated by A. fib with RVR, bacteremia, GI bleed and VDRF.   Physical Exam: General: seen in room, more energetic, NG in place Heart:RRR, s1s2 nl Lungs: Basal rhonchi, no wheezing  Abdomen:soft, Non-tender, non-distended, rectal tube in place Extremities:No edema Dialysis Access: TDC+  AV fistula no bruit   OP HD:AF MWF 4h 450/800 81.5kg 2/2.25 bath RUE AVF Hep 5000+ 1000 mid-run - hect 5 ug tiw  - mircera 50 ugIVq 4 weeks, last 11/15 (due 11/29)  Assessment/ Plan: 1. Hypotension - improving today, BP's 100/70 range. No vol^ on exam, keep even on HD today. Back in ICU.  2. Acute STEMI- S/P LAD PCI11/20, has otherdisease LCx/RCA.Transitioned to brilinta. Per cardiology. 3. R parapneumonic effusion: Chest tube placed then dislodged, replaced again yest 12/14.  Per CCM. 4. MSSA cavitary PNA / bacteremia- sp vent support. Blood cx+. Getting 6 wks IV Ancef per pharm ending 12/30/20. F/U bcx's negative. [name] of AVF neg for abscess. 5. ESRD - usual HD MWF.  Required CRRT from 11/24-12/4.  Tolerating iHD. Unable to use heparin because of GI bleed/anemia. HD today. 6. HD access - fistulogram done 12/13 by IR here, treated 2 outflow stenoses w/ good results. Duplex recommended for flow volume and eval A-V anastomosis. Will order. 7. PEA arrest: occurred in setting of agitation/ sedation.Brief CPR. 8. Sacral decub - stage IV, mod size, getting local WC 9. Anemia ckd/ABLA -off of anticoagulation.  Received blood transfusion, seen by GI, no plan for endoscopy. 10. Nutritoin - remains on NG tube feeds 11. Secondary hyperparathyroidism: Increased sevelamer.  Monitor phosphorus level. 12. Afib:on amio , in NSR. S/p DCCV. 13. Severe MR:per cardiology.  Subjective: Seen in room, ICU, looks much better.  BP's better today 100/ 70.   Objective Vital signs in last 24 hours: Vitals:   11/22/20 0801 11/22/20 0900 11/22/20 1000 11/22/20 1100  BP:  108/61 120/69 115/65  Pulse:  92 96 96  Resp:  17 (!) 22 17  Temp: 98.8 F (37.1 C)   98.5 F (36.9 C)  TempSrc:    Oral  SpO2:  96% 96% 99%  Weight:      Height:       Weight change: -6.712 kg  Intake/Output Summary (Last 24 hours) at 11/22/2020 1204 Last data filed at 11/22/2020 1100 Gross per 24 hour  Intake 1334.43 ml  Output 670 ml  Net 664.43 ml       Labs: Basic Metabolic Panel: Recent Labs  Lab 11/20/20 2033 11/21/20 1045 11/22/20 0444  NA 128* 132* 130*  K 5.6* 4.6 4.6  CL 90* 94* 93*  CO2 22 28 25   GLUCOSE 91 112* 90  BUN 143* 64* 76*  CREATININE 11.45* 7.07* 8.08*  CALCIUM 9.2 9.0 9.0  PHOS 7.6* 6.3* 5.4*   Liver Function Tests: Recent Labs  Lab 11/16/20 1628 11/17/20 0556 11/20/20 2033 11/21/20 1045 11/22/20 0444  AST  --   --   --   --  18  ALT  --   --   --   --  6  ALKPHOS  --   --   --   --  98  BILITOT  --   --   --   --  1.0  PROT 5.4*  --   --   --  5.1*  ALBUMIN  --    < >  1.3* 1.2* 1.2*  1.2*   < > = values in this interval not displayed.   No results for input(s): LIPASE, AMYLASE in the last 168 hours. No results for input(s): AMMONIA in the last 168 hours. CBC: Recent Labs  Lab 11/17/20 0556 11/17/20 2324 11/18/20 0312 11/19/20 0658 11/20/20 2140 11/21/20 0526 11/22/20 0444  WBC 6.9   < > 8.6 14.0* 13.8* 9.9 10.5  NEUTROABS 5.3  --   --   --   --   --   --   HGB 8.8*   < > 8.6* 9.0* 9.2* 8.1* 7.5*  HCT 27.5*   < > 25.7* 27.9* 28.8* 25.4* 23.3*  MCV 91.7   < > 88.9 90.0 88.6 90.4 91.7  PLT 175   < > 224 298 410* 388 433*   < > = values in this interval not displayed.   Cardiac Enzymes: No results for input(s): CKTOTAL, CKMB, CKMBINDEX, TROPONINI in the last 168 hours. CBG: Recent Labs  Lab 11/21/20 2025 11/22/20 0037 11/22/20 0427 11/22/20 0759 11/22/20 1145   GLUCAP 101* 83 92 86 88    Medications: Infusions: . sodium chloride 10 mL/hr (11/21/20 1722)  . sodium chloride Stopped (10/28/20 0954)  .  ceFAZolin (ANCEF) IV 2 g (11/17/20 1240)  . norepinephrine (LEVOPHED) Adult infusion 2 mcg/min (11/22/20 1100)    Scheduled Medications: . amiodarone  200 mg Per Tube BID  . atorvastatin  80 mg Per Tube q1800  . chlorhexidine  15 mL Mouth Rinse BID  . Chlorhexidine Gluconate Cloth  6 each Topical Q0600  . collagenase   Topical Daily  . doxercalciferol  5 mcg Intravenous Q M,W,F  . feeding supplement (NEPRO CARB STEADY)  1,000 mL Per Tube Q24H  . feeding supplement (NEPRO CARB STEADY)  237 mL Oral BID BM  . guaiFENesin  15 mL Per Tube Q4H  . heparin injection (subcutaneous)  5,000 Units Subcutaneous Q8H  . insulin aspart  0-6 Units Subcutaneous Q4H  . lactobacillus  1 g Per Tube TID WC  . mouth rinse  15 mL Mouth Rinse q12n4p  . melatonin  5 mg Per Tube QHS  . midodrine  20 mg Per Tube TID WC  . multivitamin  1 tablet Per Tube QHS  . nutrition supplement (JUVEN)  1 packet Per Tube BID BM  . pantoprazole sodium  40 mg Per Tube BID   Followed by  . [START ON 12/18/2020] pantoprazole sodium  40 mg Per Tube Daily  . polyethylene glycol  17 g Per Tube Daily  . QUEtiapine  100 mg Per Tube QHS  . sevelamer carbonate  1.6 g Per Tube TID WC  . sodium chloride flush  10 mL Intracatheter Q8H  . sodium chloride flush  10 mL Intracatheter Q8H  . sodium chloride flush  10-40 mL Intracatheter Q12H  . sodium chloride flush  3 mL Intravenous Q12H  . sodium zirconium cyclosilicate  10 g Oral BID  . sucralfate  1 g Per Tube QID  . ticagrelor  90 mg Per Tube BID    have reviewed scheduled and prn medications.

## 2020-11-22 NOTE — Progress Notes (Addendum)
Physical Therapy Wound Treatment Patient Details  Name: Travis [name] MRN: [id number] Date of Birth: 1960/12/30  Today's Date: 11/22/2020 Time: 1354-1430 Time Calculation (min): 36 min  Subjective  Subjective: Alert and appropriately conversant this session. Patient and Family Stated Goals: None stated Prior Treatments: Dressing changes  Pain Score: Premedicated. No complaints of pain throughout session.   Wound Assessment  Pressure Injury 11/14/20 Buttocks Mid;Right;Left Unstageable - Full thickness tissue loss in which the base of the injury is covered by slough (yellow, tan, gray, green or brown) and/or eschar (tan, brown or black) in the wound bed. (Active)  Dressing Type ABD;Barrier Film (skin prep);Gauze (Comment);Moist to dry 11/22/20 1401  Dressing Changed;Clean;Dry;Intact 11/22/20 1401  Dressing Change Frequency Daily 11/22/20 1401  State of Healing Early/partial granulation 11/22/20 1401  Site / Wound Assessment Dressing in place / Unable to assess 11/21/20 2000  % Wound base Red or Granulating 50% 11/22/20 1401  % Wound base Yellow/Fibrinous Exudate 40% 11/22/20 1401  % Wound base Black/Eschar 10% 11/22/20 1401  % Wound base Other/Granulation Tissue (Comment) 0% 11/22/20 1401  Peri-wound Assessment Intact 11/22/20 1401  Wound Length (cm) 11 cm 11/21/20 1200  Wound Width (cm) 17 cm 11/21/20 1200  Wound Depth (cm) 2 cm 11/21/20 1200  Wound Surface Area (cm^2) 187 cm^2 11/21/20 1200  Wound Volume (cm^3) 374 cm^3 11/21/20 1200  Tunneling (cm) 0 11/22/20 1401  Undermining (cm) 0 11/22/20 1401  Margins Unattached edges (unapproximated) 11/22/20 1401  Drainage Amount Minimal 11/22/20 1401  Drainage Description Serosanguineous 11/22/20 1401  Treatment Debridement (Selective);Hydrotherapy (Pulse lavage);Packing (Saline gauze) 11/22/20 1401   Santyl applied to wound bed prior to applying dressing.     Hydrotherapy Pulsed lavage therapy - wound location: Buttocks Pulsed  Lavage with Suction (psi): 12 psi Pulsed Lavage with Suction - Normal Saline Used: 1000 mL Pulsed Lavage Tip: Tip with splash shield Selective Debridement Selective Debridement - Location: Buttocks Selective Debridement - Tools Used: Forceps;Scalpel Selective Debridement - Tissue Removed: Eschar and yellow slough   Wound Assessment and Plan  Wound Therapy - Assess/Plan/Recommendations Wound Therapy - Clinical Statement: Progressing with debridement. Moderate bleeding noted this session, controlled by silver nitrate. This patient will benefit from continued hydrotherapy for selective removal of unviable tissue, to decrease bioburden, and promote wound bed healing. Wound Therapy - Functional Problem List: Global weakness Factors Delaying/Impairing Wound Healing: Immobility;Multiple medical problems Hydrotherapy Plan: Debridement;Dressing change;Patient/family education;Pulsatile lavage with suction Wound Therapy - Frequency: 6X / week Wound Therapy - Follow Up Recommendations: Skilled nursing facility Wound Plan: See above  Wound Therapy Goals- Improve the function of patient's integumentary system by progressing the wound(s) through the phases of wound healing (inflammation - proliferation - remodeling) by: Decrease Necrotic Tissue to: 20% Decrease Necrotic Tissue - Progress: Progressing toward goal Increase Granulation Tissue to: 80% Increase Granulation Tissue - Progress: Progressing toward goal Goals/treatment plan/discharge plan were made with and agreed upon by patient/family: Yes Time For Goal Achievement: 7 days Wound Therapy - Potential for Goals: Good  Goals will be updated until maximal potential achieved or discharge criteria met.  Discharge criteria: when goals achieved, discharge from hospital, MD decision/surgical intervention, no progress towards goals, refusal/missing three consecutive treatments without notification or medical reason.  GP     Thelma Comp 11/22/2020, 2:41 PM   Rolinda Roan, PT, DPT Acute Rehabilitation Services Pager: [phone] Office: [phone]

## 2020-11-22 NOTE — Progress Notes (Signed)
Inpatient Rehabilitation-Admissions Coordinator   Met with pt bedside for rehab assessment. Pt very motivated and appears to be a good candidate for CIR given functional decline. Will need to discuss caregiver support at DC; have left voicemail for his wife and await call back. Will also need to follow for wound healing on his buttocks as pt will need to be able to tolerate sitting for outpatient HD treatments and given the extensiveness of his wound currently, would doubt he could tolerate that immediately. Will continue to follow for progress with therapy, wound assessment, and DC support from his wife.   Raechel Ache, OTR/L  Rehab Admissions Coordinator  [phone] 11/22/2020 4:03 PM

## 2020-11-22 NOTE — Progress Notes (Signed)
NAME:  Travis [name], MRN:  [id number], DOB:  02-03-1961, LOS: 81 ADMISSION DATE:  10/28/2020, CONSULTATION DATE:  10/29/20 REFERRING MD:  Cardiology - [name], CHIEF COMPLAINT:  Hypotension, gram positive cocci on culture, AMS  Brief History   Patient is a 59 y.o. male with history of ESRD, HTN, CAD-who presented with anterior STEMI-treated with DES to LAD-hospital course prolonged and complicated by atrial fibrillation with RVR requiring cardioversion x2,, septic shock due to MSSA bacteremia, brief PEA arrest requiring CPR, acute hypoxic respiratory failure requiring intubation, upper GI bleed with acute blood loss anemia due to severe esophagitis requiring PRBC transfusion, severe metabolic encephalopathy/ICU delirium, bilateral lung abscesses with empyema requiring chest tube insertion--stabilized in the ICU-subsequently transferred to the [facility] hospitalist service on 12/12, his chest tube was dislodged on the floor, he started developing increasing right-sided pleural effusion/empyema, patient was transferred back to ICU and chest tube was placed.   Past Medical History  HTN ESRD, HD MWF  Significant Hospital Events   Cardioversion 11/21 Cardiac cath stent to LAD 11/20  Consults:  PCCM  Nephrology ID  Procedures:  Central line 11/21 >> 11/29 Arterial line 11/21 out Repeat DCCV 11/23 11/23 ETT out HD line 11/24 Arterial line 11/24 out L [place] CVC 11/30>>  Right-sided chest tube 12/14>>  Significant Diagnostic Tests:  11/24 TEE>>Left ventricular ejection fraction, by estimation, is 35 to 40%. The  left ventricle has moderately decreased function.Moderate to severe mitral regurgitation. 11/23 CT Chest>>Extensive multifocal nodular and patchy airspace disease in both lungs with a slight peripheral predominance in the upper lungs. 3.1 cm nodular consolidative opacity in the right upper lobe is cavitated. Imaging features likely related to multifocal pneumonia. Septic emboli and  metastatic disease considered less likely but not excluded. 11/22 LUE Vas Upper Extremity Doppler>> Arteriovenous fistula-Aneurysmal dilatation noted. 11/24 CT abdomen - no retroperitoneal hematoma 11/29 MR Brain>> No evidence acute intracranial abnormality, sinisitis, mastoid effusions 11/29>> MR Cervical Spine>> Given the provided history of bacteremia, facet joint septic arthritis is difficult to definitively exclude, but the lack of any surrounding marrow edema argues against this Cervical spondylosis  11/29 MR Thoracic Spine No significant marrow edema in thoracic spine , Mild thoracic spondylosis hypointense marrow signal throughout the thoracic spine, likely related to the patient's end-stage renal disease. multifocal airspace disease and cavitary pulmonary lesions. Small right pleural effusion. 11/29 MR Lumbar Spine:As noted above and Suspected cholecystolithiasis 12/11 CT chest - improving air space disease - R empyema persists but adequate chest tube position.  Micro Data:  11/21 BCx2>>Staph aureus > MSSA by BCID 11/24 BCx2>> 11/30 BC x 2>> staph epi>> MRSA 11/09/2020 blood cultures x2 no growth negative Antimicrobials:  Zosyn 11/21 x 1 Ancef 11/21 ->  Interim history/subjective:  Patient stated breathing is much better today, diarrhea has stopped.  Continue to complain of pain around chest tube insertion site  Objective   Blood pressure 115/71, pulse 84, temperature 98.8 F (37.1 C), resp. rate (!) 21, height 5\' 11"  (1.803 m), weight 83.1 kg, SpO2 96 %.        Intake/Output Summary (Last 24 hours) at 11/22/2020 1103 Last data filed at 11/22/2020 2703 Gross per 24 hour  Intake 1293.59 ml  Output 660 ml  Net 633.59 ml   Filed Weights   11/18/20 0450 11/21/20 0325 11/22/20 0600  Weight: 83 kg 89.8 kg 83.1 kg   Constitutional: frail elderly man, lying on the bed Eyes: EOMI, pupils equal Ears, nose, mouth, and throat: MM dry , cortrak  in place with trickle feeds  ongoing Cardiovascular: RRR, ext warm, no murmur Respiratory: Reduced air entry at the bases bilaterally but otherwise clear, no wheezes Gastrointestinal: Soft, +BS, rectal tube in place Skin: No rashes, normal turgor, defer to nursing documentation for pressure ulcer Neurologic: moves all 4 ext to command Psychiatric: RASS 0, alert this am  Resolved Hospital Problem list     Assessment & Plan:  Anterior STEMI s/p DES 11/20 trying to get by with brillinta alone Ischemic cardiomyopathy 40-45% Paroxysmal Afib previously on [facility] held for ongoing bleeding Recent GIB with recent EGD showing severe esophagitis and duodenal ulceration question ischemic change MSSA bacteremia thought related to HD catheter on prolonged abx therapy Loculated R parapneumonic effusion s/p chest tube with DNase and TPA Unstagable sacral ulcer pressure injury still awaiting PT eval for hydrotherapy ESRD on HD Dysphagia, receiving tube feeds Generalized deconditioning Hyponatremia Anemia of critical illness  Continue Brilinta and statins, aspirin was discontinued because of recurrent GI bleeding Cardiology is following Continue amiodarone, patient remained in sinus rhythm Can to start him on anticoagulation because of recurrent GI bleeding Continue trickle tube feeds to supplement PO and reduce hypoglycemic events Continue IV cefazolin Right-sided chest tube was placed for parapneumonic effusion now effusion has improved Wound care nurses following PT/OT evaluation H&H is stable, monitor closely Continue qHS seroquel, melatonin, encourage day/night cycles iHD per nephrology  Total critical care time: 35 minutes  Performed by: Jacky Kindle   Critical care time was exclusive of separately billable procedures and treating other patients.   Critical care was necessary to treat or prevent imminent or life-threatening deterioration.   Critical care was time spent personally by me on the following activities:  development of treatment plan with patient and/or surrogate as well as nursing, discussions with consultants, evaluation of patient's response to treatment, examination of patient, obtaining history from patient or surrogate, ordering and performing treatments and interventions, ordering and review of laboratory studies, ordering and review of radiographic studies, pulse oximetry and re-evaluation of patient's condition.   Jacky Kindle MD [facility] Pager: [phone] Mobile: [phone]

## 2020-11-22 NOTE — Progress Notes (Signed)
Occupational Therapy Treatment Patient Details Name: Travis [name] MRN: [id number] DOB: Jun 17, 1961 Today's Date: 11/22/2020    History of present illness Pt is 59 y.o. male with past medical history of end-stage renal disease on hemodialysis, essential hypertension, dilated ascending aorta and hyperlipidemia who presented on November 20 with anterior ST elevation myocardial infarction.  VDRF 11/23-12/3.  Stent placed 11/20.  Afib with RVR with cardioversion 11/21. Suspected cholecystolithiasis 11/29.   OT comments  Patient seen for co-treatment this date.  He was recently returned to ICU setting due to low BP.  BP monitored and pressures are better 127/63.  Deficits listed below continue to impact his functional abilities, but he was able to demonstrate improved bed mobility, and sit to stand transfers requiring less assist.  OT will continue to follow in the acute setting, hopefully progressing him to taking steps and increasing transfers for greater independence with toileting.  CIR continues to be recommended.    Follow Up Recommendations  CIR;Supervision/Assistance - 24 hour    Equipment Recommendations  3 in 1 bedside commode;Wheelchair (measurements OT);Wheelchair cushion (measurements OT)    Recommendations for Other Services      Precautions / Restrictions Precautions Precautions: Fall Restrictions Weight Bearing Restrictions: No Other Position/Activity Restrictions: watch BP       Mobility Bed Mobility Overal bed mobility: Needs Assistance Bed Mobility: Rolling;Supine to Sit;Sit to Supine Rolling: Min guard Sidelying to sit: Mod assist;+2 for physical assistance Supine to sit: Mod assist;+2 for physical assistance        Transfers Overall transfer level: Needs assistance   Transfers: Sit to/from Stand Sit to Stand: Mod assist;Min assist;+2 physical assistance         General transfer comment: Mod A initially progressing to Min A    Balance    Sitting-balance support: Bilateral upper extremity supported Sitting balance-Leahy Scale: Fair     Standing balance support: Bilateral upper extremity supported Standing balance-Leahy Scale: Poor                                                                                                              General Comments HR up to 121 with last stand.    Pertinent Vitals/ Pain       Faces Pain Scale: Hurts whole lot Pain Location: bottom Pain Descriptors / Indicators: Discomfort;Sore Pain Intervention(s): Monitored during session;Repositioned                                                          Frequency  Min 2X/week        Progress Toward Goals  OT Goals(current goals can now be found in the care plan section)  Progress towards OT goals: Progressing toward goals  Acute Rehab OT Goals Patient Stated Goal: Move better and get stronger OT Goal Formulation: With patient Time For Goal Achievement: 11/28/20 Potential to Achieve  Goals: [place] Discharge plan remains appropriate    Co-evaluation    PT/OT/SLP Co-Evaluation/Treatment: Yes Reason for Co-Treatment: Complexity of the patient's impairments (multi-system involvement);For patient/therapist safety   OT goals addressed during session: ADL's and self-care;Other (comment) (toilet transfers)      AM-PAC OT "6 Clicks" Daily Activity     Outcome Measure   Help from another person eating meals?: Total   Help from another person toileting, which includes using toliet, bedpan, or urinal?: Total   Help from another person to put on and taking off regular upper body clothing?: A Lot Help from another person to put on and taking off regular lower body clothing?: Total 6 Click Score: 5    End of Session Equipment Utilized During Treatment: Gait belt;Rolling walker;Oxygen  OT Visit Diagnosis: Unsteadiness on feet (R26.81);Other  abnormalities of gait and mobility (R26.89);Muscle weakness (generalized) (M62.81);Pain   Activity Tolerance Patient limited by fatigue   Patient Left in bed;with call bell/phone within reach;with family/visitor present   Nurse Communication Other (comment)        Time: 5732-2025 OT Time Calculation (min): 29 min  Charges: OT General Charges $OT Visit: 1 Visit OT Treatments $Therapeutic Activity: 8-22 mins  11/22/2020  Rich, OTR/L  Acute Rehabilitation Services  Office:  [phone]    Metta Clines 11/22/2020, 11:17 AM

## 2020-11-22 NOTE — Progress Notes (Signed)
Progress Note  Patient Name: Travis [name] Date of Encounter: 11/22/2020  [facility] Cardiologist: Glenetta Hew, MD   Subjective   Much more alert today. States he feels great. In much better spirits today. Denies chest pain.complains of pain in buttocks area  Inpatient Medications    Scheduled Meds: . amiodarone  200 mg Per Tube BID  . atorvastatin  80 mg Per Tube q1800  . chlorhexidine  15 mL Mouth Rinse BID  . Chlorhexidine Gluconate Cloth  6 each Topical Q0600  . collagenase   Topical Daily  . doxercalciferol  5 mcg Intravenous Q M,W,F  . feeding supplement (NEPRO CARB STEADY)  1,000 mL Per Tube Q24H  . feeding supplement (NEPRO CARB STEADY)  237 mL Oral BID BM  . guaiFENesin  15 mL Per Tube Q4H  . heparin injection (subcutaneous)  5,000 Units Subcutaneous Q8H  . insulin aspart  0-6 Units Subcutaneous Q4H  . lactobacillus  1 g Per Tube TID WC  . mouth rinse  15 mL Mouth Rinse q12n4p  . melatonin  5 mg Per Tube QHS  . midodrine  20 mg Per Tube TID WC  . multivitamin  1 tablet Per Tube QHS  . nutrition supplement (JUVEN)  1 packet Per Tube BID BM  . pantoprazole sodium  40 mg Per Tube BID   Followed by  . [START ON 12/18/2020] pantoprazole sodium  40 mg Per Tube Daily  . polyethylene glycol  17 g Per Tube Daily  . QUEtiapine  100 mg Per Tube QHS  . sevelamer carbonate  1.6 g Per Tube TID WC  . sodium chloride flush  10 mL Intracatheter Q8H  . sodium chloride flush  10 mL Intracatheter Q8H  . sodium chloride flush  10-40 mL Intracatheter Q12H  . sodium chloride flush  3 mL Intravenous Q12H  . sodium zirconium cyclosilicate  10 g Oral BID  . sucralfate  1 g Per Tube QID  . ticagrelor  90 mg Per Tube BID   Continuous Infusions: . sodium chloride 10 mL/hr (11/21/20 1722)  . sodium chloride Stopped (10/28/20 0954)  .  ceFAZolin (ANCEF) IV 2 g (11/17/20 1240)  . norepinephrine (LEVOPHED) Adult infusion 4 mcg/min (11/22/20 0702)   PRN Meds: sodium chloride,  acetaminophen, clonazepam, ondansetron (ZOFRAN) IV, oxyCODONE, Resource ThickenUp Clear, sennosides, sodium chloride, sodium chloride flush, sodium chloride flush   Vital Signs    Vitals:   11/22/20 0700 11/22/20 0715 11/22/20 0730 11/22/20 0801  BP: 116/71 115/67 115/71   Pulse: 84 88 84   Resp: 16 (!) 23 (!) 21   Temp:    98.8 F (37.1 C)  TempSrc:      SpO2: 95% 93% 96%   Weight:      Height:        Intake/Output Summary (Last 24 hours) at 11/22/2020 0915 Last data filed at 11/22/2020 4650 Gross per 24 hour  Intake 1293.59 ml  Output 660 ml  Net 633.59 ml   Last 3 Weights 11/22/2020 11/21/2020 11/18/2020  Weight (lbs) 183 lb 3.2 oz 198 lb 182 lb 15.7 oz  Weight (kg) 83.1 kg 89.812 kg 83 kg      Telemetry    SR - Personally Reviewed  ECG    No new tracing  Physical Exam    Frail, ill appearing male GEN: No acute distress. Cortrack in place  Neck: No JVD Cardiac: RRR, no murmurs, rubs, or gallops.  Respiratory: diminished in right base, course BS GI:  Soft, nontender, non-distended. Rectal tube in place MS: No edema; No deformity. Neuro:  Nonfocal  Psych: Normal affect   Labs    High Sensitivity Troponin:   Recent Labs  Lab 10/28/20 0629 10/28/20 1046 10/28/20 2040 10/29/20 2121 10/30/20 0548  TROPONINIHS 3,155* >27,000* >27,000* >27,000* >27,000*      Chemistry Recent Labs  Lab 11/16/20 1628 11/17/20 0556 11/20/20 2033 11/21/20 1045 11/22/20 0444  NA  --    < > 128* 132* 130*  K  --    < > 5.6* 4.6 4.6  CL  --    < > 90* 94* 93*  CO2  --    < > _0 GLUCOSE  --    < > 91 112* 90  BUN  --    < > 143* 64* 76*  CREATININE  --    < > 11.45* 7.07* 8.08*  CALCIUM  --    < > 9.2 9.0 9.0  PROT 5.4*  --   --   --  5.1*  ALBUMIN  --    < > 1.3* 1.2* 1.2*  1.2*  AST  --   --   --   --  18  ALT  --   --   --   --  6  ALKPHOS  --   --   --   --  98  BILITOT  --   --   --   --  1.0  GFRNONAA  --    < > 5* 8* 7*  ANIONGAP  --    < > 16* 10  12   < > = values in this interval not displayed.     Hematology Recent Labs  Lab 11/20/20 2140 11/21/20 0526 11/22/20 0444  WBC 13.8* 9.9 10.5  RBC 3.25* 2.81* 2.54*  HGB 9.2* 8.1* 7.5*  HCT 28.8* 25.4* 23.3*  MCV 88.6 90.4 91.7  MCH 28.3 28.8 29.5  MCHC 31.9 31.9 32.2  RDW 14.6 14.6 14.7  PLT 410* 388 433*    BNPNo results for input(s): BNP, PROBNP in the last 168 hours.   DDimer No results for input(s): DDIMER in the last 168 hours.   Radiology    DG Chest 1 View  Result Date: 11/21/2020 CLINICAL DATA:  Pleural effusion. EXAM: CHEST  1 VIEW COMPARISON:  11/20/2020. FINDINGS: Feeding tube, left subclavian line, right IJ line in stable position. Stable cardiomegaly. Progressive dense right base atelectasis/consolidation. Large progressive right-sided pleural effusion. Previously identified drainage tube over the right chest/upper abdomen not identified on today's exam. Mild right upper chest wall subcutaneous emphysema. IMPRESSION: 1. Progressive dense right base atelectasis/consolidation. Large progressive right-sided pleural effusion. 2. Previously identified drainage tube over the right chest/upper abdomen not identified on today's exam. 3. Feeding tube and bilateral IJ lines in stable position. 4. Stable cardiomegaly. Electronically Signed   By: Marcello Moores  Register   On: 11/21/2020 06:23   IR US Guide Vasc Access Left  Result Date: 11/20/2020 INDICATION: 59 year old male with a history of malfunctioning left upper extremity fistula. 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 A recent duplex approximately 2-3 weeks prior demonstrates a flow volume of 1816 mL per minute, with no evidence stenosis. Given malfunction, he presents for fistulagram EXAM: ULTRASOUND-GUIDED ACCESS LEFT UPPER EXTREMITY FISTULA BALLOON ANGIOPLASTY OF OUTFLOW STENOSES MEDICATIONS: None. ANESTHESIA/SEDATION: Moderate  Sedation Time:  0 minutes The patient was continuously monitored during  the procedure by the interventional radiology nurse under my direct supervision. FLUOROSCOPY TIME:  Fluoroscopy Time: 3 minutes 24 seconds (40 mGy). COMPLICATIONS: None PROCEDURE: Informed written consent was obtained from the patient after a thorough discussion of the procedural risks, benefits and alternatives. All questions were addressed. Maximal Sterile Barrier Technique was utilized including caps, mask, sterile gowns, sterile gloves, sterile drape, hand hygiene and skin antiseptic. A timeout was performed prior to the initiation of the procedure. Ultrasound survey was performed with images stored and sent to PACs. The left upper extremity was prepped and draped in the usual sterile fashion. Ultrasound guidance was used to access the left upper extremity graft with a micropuncture kit. Once the 4 French sheath was within the graft, left upper extremity fistulogram was performed. Images were carried out to the central vasculature. Bentson wire was then placed into the outflow and a 6 French sheath was placed. Combination of Glidewire and the Bentson wire and an angled Kumpe the catheter were used to navigate through the tandem stenoses of the venous outflow. Once the Glidewire and the Kumpe the catheter were through the tandem stenoses, the Bentson wire was placed through the catheter and the catheter was removed. Serial standard balloon angioplasty was then performed at the outflow stenoses first 6 mm then 8 mm. Finally 12 mm balloon angioplasty was performed first at the most central of the stenoses (greater than 95%) and then at the more distal. After balloon angioplasty of venous outflow was performed, balloon was inflated and reflux images of the arterial anastomosis performed. Given the volume measured on the recent duplex of greater than 1800 cc/minute we elected not to puncture in a peripheral directed access. All catheters wires  were removed and a suture was placed at the access point for hemostasis. Sterile bandage was placed. Excellent thrill was confirmed at the conclusion. Patient tolerated the procedure well remained hemodynamically stable throughout. No complications were encountered and no significant blood loss. 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 Both of these were treated to 0% residual with standard balloon angioplasty to 12 mm. Excellent thrill upon completion. IMPRESSION: Status post ultrasound guided access left upper extremity fistula for standard balloon angioplasty treatment of tandem high-grade outflow stenoses, with 0% residual after treatment. Signed, Dulcy Fanny. Dellia Nims, RPVI [facility] Rad[facility] Radiology ACCESS: Recommend early duplex evaluation of flow volumes and the arteriovenous anastomosis. This access remains amenable to future percutaneous interventions as clinically indicated. Electronically Signed   By: Corrie Mckusick D.O.   On: 11/20/2020 15:11   DG Chest Port 1 View  Result Date: 11/22/2020 CLINICAL DATA:  Status post chest tube placement EXAM: PORTABLE CHEST 1 VIEW COMPARISON:  11/21/2020 FINDINGS: Cardiac shadow remains enlarged in size. Left subclavian and right jugular central lines are again seen and stable. Feeding catheter extends into the stomach. Right-sided pigtail thoracostomy tube is noted. No pneumothorax is seen. Mild right basilar atelectasis is again noted. IMPRESSION: No evidence of pneumothorax. Mild right basilar atelectasis. Electronically Signed   By: Inez Catalina M.D.   On: 11/22/2020 08:05   DG Chest Port 1 View  Result Date: 11/21/2020 CLINICAL DATA:  Status post right pigtail thoracostomy tube placement for pleural effusion. EXAM: PORTABLE CHEST 1 VIEW COMPARISON:  Film earlier today at 0544 hours FINDINGS:  Interval placement of right lateral pigtail thoracostomy tube with drainage of right pleural fluid and improved  aeration of the right lung with some residual atelectasis in the right lower lung. No pneumothorax. Temporary non tunneled dialysis catheter, left subclavian central line and feeding tube position stable. Stable cardiac enlargement. No left pleural fluid identified. IMPRESSION: No pneumothorax after placement of right lateral pigtail thoracostomy tube with improved aeration of the right lung after evacuation of right pleural fluid. Electronically Signed   By: Aletta Edouard M.D.   On: 11/21/2020 16:27   IR AV DIALY SHUNT INTRO NEEDLE/INTRAC INITIAL W/PTA/STENT/IMG LEFT  Result Date: 11/20/2020 INDICATION: 59 year old male with a history of malfunctioning left upper extremity fistula. 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 A recent duplex approximately 2-3 weeks prior demonstrates a flow volume of 1816 mL per minute, with no evidence stenosis. Given malfunction, he presents for fistulagram EXAM: ULTRASOUND-GUIDED ACCESS LEFT UPPER EXTREMITY FISTULA BALLOON ANGIOPLASTY OF OUTFLOW STENOSES MEDICATIONS: None. ANESTHESIA/SEDATION: Moderate Sedation Time:  0 minutes The patient was continuously monitored during the procedure by the interventional radiology nurse under my direct supervision. FLUOROSCOPY TIME:  Fluoroscopy Time: 3 minutes 24 seconds (40 mGy). COMPLICATIONS: None PROCEDURE: Informed written consent was obtained from the patient after a thorough discussion of the procedural risks, benefits and alternatives. All questions were addressed. Maximal Sterile Barrier Technique was utilized including caps, mask, sterile gowns, sterile gloves, sterile drape, hand hygiene and skin antiseptic. A timeout was performed prior to the initiation of the procedure. Ultrasound survey was performed with images stored and  sent to PACs. The left upper extremity was prepped and draped in the usual sterile fashion. Ultrasound guidance was used to access the left upper extremity graft with a micropuncture kit. Once the 4 French sheath was within the graft, left upper extremity fistulogram was performed. Images were carried out to the central vasculature. Bentson wire was then placed into the outflow and a 6 French sheath was placed. Combination of Glidewire and the Bentson wire and an angled Kumpe the catheter were used to navigate through the tandem stenoses of the venous outflow. Once the Glidewire and the Kumpe the catheter were through the tandem stenoses, the Bentson wire was placed through the catheter and the catheter was removed. Serial standard balloon angioplasty was then performed at the outflow stenoses first 6 mm then 8 mm. Finally 12 mm balloon angioplasty was performed first at the most central of the stenoses (greater than 95%) and then at the more distal. After balloon angioplasty of venous outflow was performed, balloon was inflated and reflux images of the arterial anastomosis performed. Given the volume measured on the recent duplex of greater than 1800 cc/minute we elected not to puncture in a peripheral directed access. All catheters wires were removed and a suture was placed at the access point for hemostasis. Sterile bandage was placed. Excellent thrill was confirmed at the conclusion. Patient tolerated the procedure well remained hemodynamically stable throughout. No complications were encountered and no significant blood loss. 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 Both of these were treated to 0% residual with standard balloon angioplasty to 12 mm. Excellent thrill upon completion. IMPRESSION: Status post ultrasound guided access left upper extremity fistula for standard balloon angioplasty  treatment of tandem high-grade outflow stenoses, with 0% residual after treatment. Signed, Dulcy Fanny. Dellia Nims, RPVI [facility] Interventional Radiology Specialists [facility] Radiology ACCESS: Recommend early duplex evaluation of flow  volumes and the arteriovenous anastomosis. This access remains amenable to future percutaneous interventions as clinically indicated. Electronically Signed   By: Corrie Mckusick D.O.   On: 11/20/2020 15:11    Cardiac Studies   Cath: 10/28/20    There is mild to moderate left ventricular systolic dysfunction. The left ventricular ejection fraction is 40-45% by visual estimate - > anterior-anterolateral apical hypokinesis  LV end diastolic pressure is severely elevated -> on recheck post PCI, EDP 30 mmHg  Mid LAD lesion is 99% stenosed. 1st Diag lesion is 55% stenosed just proximal to the lesion  A drug-eluting stent was successfully placed crossing the diagonal branch, using a [id number] [id number]. Postdilated to 3.1 mm  Post intervention, there is a 0% residual stenosis.  LPAV lesion is 99% stenosed -> ostial lesion (LCx is 90  turn followed by another 90  turn into the AVG)  Prox RCA lesion is 80% stenosed -> concentric napkin ring calcified lesion.  SUMMARY  Acute anterolateral ST elevation MI with Culprit lesion being the 99% mid LAD lesion (just past major 1st Diag/D1) along with multivessel CAD including 99% ostial AV groove LCx and 80% calcified napkin ring proximal RCA lesion with extensive calcification throughout the RCA. ? Successful PTCA and DES PCI of the LAD crossing D1 -resolute Onyx DES 2.75 mm x 18 mm postdilated to 3.1 mm. ? With PCI, there was stabilization of significant ectopy, AIVR and PVCs  Mild to moderate reduced EF with anterior anterolateral hypokinesis, EF roughly 40 to 45%.  Initial evaluation suggested normal EDP, but on recheck post PCI LVEDP of 30 mmHg, severely elevated consistent with ACUTE DIASTOLIC HEART  FAILURE  Significantly dilated aortic root requiring AL-1 guide catheter for both Left and Right Coronary Angiography.   RECOMMENDATIONS  Admit to CCU, restart IV heparin 8 hours after sheath removal  Check 2D echo for better assessment of EF  Plan to review cath films with other IC cardiologist, anticipate staged PCI of at least the RCA which may require atherectomy versus lithotripsy, and likely the ostial AV groove circumflex.  If he has signs of dyspnea, consider earlier dialysis than tomorrow. ->Would consult for nephrology to see today.  He is already on labetalol and Norvasc. We will continue current home medications.  Uninterrupted DAPT times X 1 year.  Glenetta Hew, M.D., M.S. Interventional Cardiologist   Diagnostic Dominance: Right    Intervention    Echo: 11/13/20  IMPRESSIONS    1. Left ventricular ejection fraction, by estimation, is 45 to 50%. The  left ventricle has mildly decreased function. The left ventricle  demonstrates regional wall motion abnormalities (see scoring  diagram/findings for description). There is severe  concentric left ventricular hypertrophy. Left ventricular diastolic  parameters are consistent with Grade I diastolic dysfunction (impaired  relaxation).  2. Right ventricular systolic function is normal. The right ventricular  size is normal. There is normal pulmonary artery systolic pressure. The  estimated right ventricular systolic pressure is 35.0 mmHg.  3. The mitral valve is degenerative. Mild mitral valve regurgitation. No  evidence of mitral stenosis.  4. The aortic valve is tricuspid. There is mild calcification of the  aortic valve. There is mild thickening of the aortic valve. Aortic valve  regurgitation is mild. Mild aortic valve sclerosis is present, with no  evidence of aortic valve stenosis.  5. The inferior vena cava is normal in size with greater than 50%  respiratory variability, suggesting  right atrial pressure of 3 mmHg.  Comparison(s): Changes from prior study are noted. EF has improved to  45-50%. WMAs remain the mid septum into apex. RVSP has improved.   Patient Profile     59 y.o. male  with ESRD on HD, HTN, tobacco abuse, CAD presenting with anterior STEMI 10/28/20 s/p placement of a stent in the LAD with residual disease in the RCA and Circumflex. He developed atrial fib with RVR. Hospital course complicated by MSSA bacteremia with shock, upper GI bleeding secondary to severe esophagitis and duodenal ulcer, ICU delirium, metabolic encephalopathy, acute hypoxemic respiratory failure, anemia.   Assessment & Plan    1. Anterior STEMI: culprit lesion noted in the mLAD 99% treated with DESx1. Initially placed on DAPT with ASA/Brilinta with plans to consider for staged intervention to the RCA. Now on monotherapy with Brilinta given significant anemia throughout admission. Was briefly on IV cangrelor but back to Brilinta. Plans for staged intervention to the RCA on hold with his multiple co-morbidities, do not anticipate this being done this admission.  -- continue on Brilinta and statin  2. CAD: as above recent LAD stenting with residual disease in the RCA being treated medically at this time. No active angina. Antianginal therapy limited by low BP.  3. Septic Shock: 2/2 MSSA bacteremia. Total of 6 weeks of IV antibiotics per ID. Still treated with midodrine.  4. PAF: developed rapid rates on 11/21 and 11/23 requiring cardioversion on each occasion. Now on oral amiodarone 24m daily. Not considered a candidate for [place] anemia and GI bleed. No recurrent Afib on amio.  -Remains on Amiodarone 201mBID  5. Ischemic cardiomyopathy: EF noted at 45-50%. Volume status maintained with dialysis. Amount of fluid removal limited by hypotension. Now on levophed. No BP lowering cardiac meds. On midodrine.  6. Anemia: s/p transfusion. Seen by GI. Hgb back down from 9.2>>8.1> 7.5  this morning. Primary team to decide on transfusion.   7. Moderate to Severe MR: repeat echo 12/6 showed mild MR instead of moderate to severe  8. Loculated Pleural effusion: right sided effusion. Had a chest tube in placed on 12/9, dislodged 12/13/  CXR with increasing effusion yesterday and tube replaced.   9. ESRD on HD: followed by Nephrology  10. Pressure sacral wound: management per primary   For questions or updates, please contact [facility] consult [email or web address] for contact info under        Signed, Rowe Warman JoMartiniqueMD  11/22/2020, 9:15 AM

## 2020-11-23 ENCOUNTER — Inpatient Hospital Stay (HOSPITAL_COMMUNITY): Payer: Medicare Other

## 2020-11-23 LAB — CBC
HCT: 22 % — ABNORMAL LOW (ref 39.0–52.0)
Hemoglobin: 7.1 g/dL — ABNORMAL LOW (ref 13.0–17.0)
MCH: 29.6 pg (ref 26.0–34.0)
MCHC: 32.3 g/dL (ref 30.0–36.0)
MCV: 91.7 fL (ref 80.0–100.0)
Platelets: 481 10*3/uL — ABNORMAL HIGH (ref 150–400)
RBC: 2.4 MIL/uL — ABNORMAL LOW (ref 4.22–5.81)
RDW: 14.6 % (ref 11.5–15.5)
WBC: 10.9 10*3/uL — ABNORMAL HIGH (ref 4.0–10.5)
nRBC: 0 % (ref 0.0–0.2)

## 2020-11-23 LAB — PHOSPHORUS: Phosphorus: 5.7 mg/dL — ABNORMAL HIGH (ref 2.5–4.6)

## 2020-11-23 LAB — COMPREHENSIVE METABOLIC PANEL
ALT: <5 U/L (ref 0–44)
AST: 17 U/L (ref 15–41)
Albumin: 1.2 g/dL — ABNORMAL LOW (ref 3.5–5.0)
Alkaline Phosphatase: 99 U/L (ref 38–126)
Anion gap: 16 — ABNORMAL HIGH (ref 5–15)
BUN: 95 mg/dL — ABNORMAL HIGH (ref 6–20)
CO2: 23 mmol/L (ref 22–32)
Calcium: 8.9 mg/dL (ref 8.9–10.3)
Chloride: 91 mmol/L — ABNORMAL LOW (ref 98–111)
Creatinine, Ser: 9.78 mg/dL — ABNORMAL HIGH (ref 0.61–1.24)
GFR, Estimated: 6 mL/min — ABNORMAL LOW (ref >60)
Glucose, Bld: 84 mg/dL (ref 70–99)
Potassium: 5.1 mmol/L (ref 3.5–5.1)
Sodium: 130 mmol/L — ABNORMAL LOW (ref 135–145)
Total Bilirubin: 0.9 mg/dL (ref 0.3–1.2)
Total Protein: 4.9 g/dL — ABNORMAL LOW (ref 6.5–8.1)

## 2020-11-23 LAB — GLUCOSE, CAPILLARY
Glucose-Capillary: 77 mg/dL (ref 70–99)
Glucose-Capillary: 86 mg/dL (ref 70–99)
Glucose-Capillary: 87 mg/dL (ref 70–99)
Glucose-Capillary: 89 mg/dL (ref 70–99)
Glucose-Capillary: 91 mg/dL (ref 70–99)
Glucose-Capillary: 94 mg/dL (ref 70–99)

## 2020-11-23 LAB — MAGNESIUM: Magnesium: 2.8 mg/dL — ABNORMAL HIGH (ref 1.7–2.4)

## 2020-11-23 MED ORDER — CHLORHEXIDINE GLUCONATE CLOTH 2 % EX PADS
6.0000 | MEDICATED_PAD | Freq: Every day | CUTANEOUS | Status: DC
  Administered 2020-11-23 – 2020-12-03 (×7): 6 via TOPICAL

## 2020-11-23 MED ORDER — HEPARIN SODIUM (PORCINE) 1000 UNIT/ML IJ SOLN
INTRAMUSCULAR | Status: AC
  Administered 2020-11-23: 1000 [IU]
  Filled 2020-11-23: qty 3

## 2020-11-23 NOTE — Progress Notes (Signed)
SLP Cancellation Note  Patient Details Name: Travis [name] MRN: [id number] DOB: 1961/08/09   Cancelled treatment:       Reason Eval/Treat Not Completed: Medical issues which prohibited therapy. Pt had recently finished breakfast and taken all of his meds, so he is currently feeling very full and a little nauseous. RN already administered an antiemetic. Pt politely declines POs at this time, but his wife did ask about potential for diet advancement as he has been eating 100% of his meals with no reported difficulty. SLP provided information about different diet levels. Will f/u for ongoing differential diagnosis of swallowing abilities and progression of diet as able.     Osie Bond., M.A. [facility] Pager [phone] Office [phone]  11/23/2020, 9:21 AM

## 2020-11-23 NOTE — Progress Notes (Signed)
Progress Note  Patient Name: Travis [name] Date of Encounter: 11/23/2020  [facility] Cardiologist: Glenetta Hew, MD   Subjective   Alert.  States he feels great. In good spirits today. Denies chest pain.  Inpatient Medications    Scheduled Meds:  amiodarone  200 mg Per Tube BID   atorvastatin  80 mg Per Tube q1800   chlorhexidine  15 mL Mouth Rinse BID   Chlorhexidine Gluconate Cloth  6 each Topical Q0600   collagenase   Topical Daily   doxercalciferol  5 mcg Intravenous Q M,W,F   feeding supplement (NEPRO CARB STEADY)  1,000 mL Per Tube Q24H   feeding supplement (NEPRO CARB STEADY)  237 mL Oral BID BM   guaiFENesin  15 mL Per Tube Q4H   heparin injection (subcutaneous)  5,000 Units Subcutaneous Q8H   insulin aspart  0-6 Units Subcutaneous Q4H   lactobacillus  1 g Per Tube TID WC   mouth rinse  15 mL Mouth Rinse q12n4p   melatonin  5 mg Per Tube QHS   midodrine  20 mg Per Tube TID WC   multivitamin  1 tablet Per Tube QHS   nutrition supplement (JUVEN)  1 packet Per Tube BID BM   pantoprazole sodium  40 mg Per Tube BID   Followed by   Derrill Memo ON 12/18/2020] pantoprazole sodium  40 mg Per Tube Daily   polyethylene glycol  17 g Per Tube Daily   QUEtiapine  100 mg Per Tube QHS   sevelamer carbonate  1.6 g Per Tube TID WC   sodium chloride flush  10 mL Intracatheter Q8H   sodium chloride flush  10 mL Intracatheter Q8H   sodium chloride flush  10-40 mL Intracatheter Q12H   sodium chloride flush  3 mL Intravenous Q12H   sodium zirconium cyclosilicate  10 g Oral BID   sucralfate  1 g Per Tube QID   ticagrelor  90 mg Per Tube BID   Continuous Infusions:  sodium chloride 10 mL/hr (11/21/20 1722)   sodium chloride Stopped (10/28/20 0954)    ceFAZolin (ANCEF) IV Stopped (11/22/20 1221)   norepinephrine (LEVOPHED) Adult infusion Stopped (11/23/20 0548)   PRN Meds: sodium chloride, acetaminophen, clonazepam, ondansetron (ZOFRAN) IV,  oxyCODONE, Resource ThickenUp Clear, sennosides, silver nitrate applicators, sodium chloride, sodium chloride flush, sodium chloride flush   Vital Signs    Vitals:   11/23/20 0600 11/23/20 0615 11/23/20 0630 11/23/20 0645  BP: 111/69 107/73 115/64 103/66  Pulse: 95 84 81 82  Resp: (!) 26 (!) 24 (!) 29 (!) 22  Temp:      TempSrc:      SpO2: 96% 97% 99% 97%  Weight:      Height:        Intake/Output Summary (Last 24 hours) at 11/23/2020 0750 Last data filed at 11/23/2020 0600 Gross per 24 hour  Intake 351.47 ml  Output 10 ml  Net 341.47 ml   Last 3 Weights 11/23/2020 11/22/2020 11/21/2020  Weight (lbs) 186 lb 8.2 oz 183 lb 3.2 oz 198 lb  Weight (kg) 84.6 kg 83.1 kg 89.812 kg      Telemetry    SR - Personally Reviewed  ECG    No new tracing  Physical Exam    Frail, ill appearing male GEN: No acute distress. Cortrack in place  Neck: No JVD Cardiac: RRR, no murmurs, rubs, or gallops.  Respiratory: diminished in right base, course BS. Chest tube in place GI: Soft, nontender, non-distended. Rectal  tube in place MS: No edema; No deformity. Neuro:  Nonfocal  Psych: Normal affect   Labs    High Sensitivity Troponin:   Recent Labs  Lab 10/28/20 0629 10/28/20 1046 10/28/20 2040 10/29/20 2121 10/30/20 0548  TROPONINIHS 3,155* >27,000* >27,000* >27,000* >27,000*      Chemistry Recent Labs  Lab 11/16/20 1628 11/17/20 0556 11/21/20 1045 11/22/20 0444 11/23/20 0425  NA  --    < > 132* 130* 130*  K  --    < > 4.6 4.6 5.1  CL  --    < > 94* 93* 91*  CO2  --    < > 28 25 23   GLUCOSE  --    < > 112* 90 84  BUN  --    < > 64* 76* 95*  CREATININE  --    < > 7.07* 8.08* 9.78*  CALCIUM  --    < > 9.0 9.0 8.9  PROT 5.4*  --   --  5.1* 4.9*  ALBUMIN  --    < > 1.2* 1.2*   1.2* 1.2*  AST  --   --   --  18 17  ALT  --   --   --  6 <5  ALKPHOS  --   --   --  98 99  BILITOT  --   --   --  1.0 0.9  GFRNONAA  --    < > 8* 7* 6*  ANIONGAP  --    < > 10 12 16*   < >  = values in this interval not displayed.     Hematology Recent Labs  Lab 11/21/20 0526 11/22/20 0444 11/23/20 0425  WBC 9.9 10.5 10.9*  RBC 2.81* 2.54* 2.40*  HGB 8.1* 7.5* 7.1*  HCT 25.4* 23.3* 22.0*  MCV 90.4 91.7 91.7  MCH 28.8 29.5 29.6  MCHC 31.9 32.2 32.3  RDW 14.6 14.7 14.6  PLT 388 433* 481*    BNPNo results for input(s): BNP, PROBNP in the last 168 hours.   DDimer No results for input(s): DDIMER in the last 168 hours.   Radiology    DG Chest Port 1 View  Result Date: 11/22/2020 CLINICAL DATA:  Status post chest tube placement EXAM: PORTABLE CHEST 1 VIEW COMPARISON:  11/21/2020 FINDINGS: Cardiac shadow remains enlarged in size. Left subclavian and right jugular central lines are again seen and stable. Feeding catheter extends into the stomach. Right-sided pigtail thoracostomy tube is noted. No pneumothorax is seen. Mild right basilar atelectasis is again noted. IMPRESSION: No evidence of pneumothorax. Mild right basilar atelectasis. Electronically Signed   By: Inez Catalina M.D.   On: 11/22/2020 08:05   DG Chest Port 1 View  Result Date: 11/21/2020 CLINICAL DATA:  Status post right pigtail thoracostomy tube placement for pleural effusion. EXAM: PORTABLE CHEST 1 VIEW COMPARISON:  Film earlier today at 0544 hours FINDINGS: Interval placement of right lateral pigtail thoracostomy tube with drainage of right pleural fluid and improved aeration of the right lung with some residual atelectasis in the right lower lung. No pneumothorax. Temporary non tunneled dialysis catheter, left subclavian central line and feeding tube position stable. Stable cardiac enlargement. No left pleural fluid identified. IMPRESSION: No pneumothorax after placement of right lateral pigtail thoracostomy tube with improved aeration of the right lung after evacuation of right pleural fluid. Electronically Signed   By: Aletta Edouard M.D.   On: 11/21/2020 16:27    Cardiac Studies   Cath:  10/28/20  There is mild to moderate left ventricular systolic dysfunction. The left ventricular ejection fraction is 40-45% by visual estimate - > anterior-anterolateral apical hypokinesis  LV end diastolic pressure is severely elevated -> on recheck post PCI, EDP 30 mmHg  Mid LAD lesion is 99% stenosed. 1st Diag lesion is 55% stenosed just proximal to the lesion  A drug-eluting stent was successfully placed crossing the diagonal branch, using a [place] [id number]. Postdilated to 3.1 mm  Post intervention, there is a 0% residual stenosis.  LPAV lesion is 99% stenosed -> ostial lesion (LCx is 90  turn followed by another 90  turn into the AVG)  Prox RCA lesion is 80% stenosed -> concentric napkin ring calcified lesion.  SUMMARY  Acute anterolateral ST elevation MI with Culprit lesion being the 99% mid LAD lesion (just past major 1st Diag/D1) along with multivessel CAD including 99% ostial AV groove LCx and 80% calcified napkin ring proximal RCA lesion with extensive calcification throughout the RCA. ? Successful PTCA and DES PCI of the LAD crossing D1 -resolute Onyx DES 2.75 mm x 18 mm postdilated to 3.1 mm. ? With PCI, there was stabilization of significant ectopy, AIVR and PVCs  Mild to moderate reduced EF with anterior anterolateral hypokinesis, EF roughly 40 to 45%.  Initial evaluation suggested normal EDP, but on recheck post PCI LVEDP of 30 mmHg, severely elevated consistent with ACUTE DIASTOLIC HEART FAILURE  Significantly dilated aortic root requiring AL-1 guide catheter for both Left and Right Coronary Angiography.   RECOMMENDATIONS  Admit to CCU, restart IV heparin 8 hours after sheath removal  Check 2D echo for better assessment of EF  Plan to review cath films with other IC cardiologist, anticipate staged PCI of at least the RCA which may require atherectomy versus lithotripsy, and likely the ostial AV groove circumflex.  If he has signs of dyspnea,  consider earlier dialysis than tomorrow. ->Would consult for nephrology to see today.  He is already on labetalol and Norvasc. We will continue current home medications.  Uninterrupted DAPT times X 1 year.  Glenetta Hew, M.D., M.S. Interventional Cardiologist   Diagnostic Dominance: Right    Intervention    Echo: 11/13/20  IMPRESSIONS    1. Left ventricular ejection fraction, by estimation, is 45 to 50%. The  left ventricle has mildly decreased function. The left ventricle  demonstrates regional wall motion abnormalities (see scoring  diagram/findings for description). There is severe  concentric left ventricular hypertrophy. Left ventricular diastolic  parameters are consistent with Grade I diastolic dysfunction (impaired  relaxation).  2. Right ventricular systolic function is normal. The right ventricular  size is normal. There is normal pulmonary artery systolic pressure. The  estimated right ventricular systolic pressure is 95.6 mmHg.  3. The mitral valve is degenerative. Mild mitral valve regurgitation. No  evidence of mitral stenosis.  4. The aortic valve is tricuspid. There is mild calcification of the  aortic valve. There is mild thickening of the aortic valve. Aortic valve  regurgitation is mild. Mild aortic valve sclerosis is present, with no  evidence of aortic valve stenosis.  5. The inferior vena cava is normal in size with greater than 50%  respiratory variability, suggesting right atrial pressure of 3 mmHg.   Comparison(s): Changes from prior study are noted. EF has improved to  45-50%. WMAs remain the mid septum into apex. RVSP has improved.   Patient Profile     59 y.o. male  with ESRD on HD, HTN, tobacco abuse, CAD  presenting with anterior STEMI 10/28/20 s/p placement of a stent in the LAD with residual disease in the RCA and Circumflex. He developed atrial fib with RVR. Hospital course complicated by MSSA bacteremia with shock, upper  GI bleeding secondary to severe esophagitis and duodenal ulcer, ICU delirium, metabolic encephalopathy, acute hypoxemic respiratory failure, anemia.   Assessment & Plan    1. Anterior STEMI: culprit lesion noted in the mLAD 99% treated with DESx1 on 10/28/20. Initially placed on DAPT with ASA/Brilinta with plans to consider for staged intervention to the RCA. Now on monotherapy with Brilinta given significant anemia throughout admission.  Plans for staged intervention to the RCA on hold with his multiple co-morbidities, do not anticipate this being done this admission. He is having no significant angina -- continue on Brilinta and statin  2. CAD: as above s/pLAD stenting with residual disease in the RCA being treated medically at this time. No active angina. Antianginal therapy limited by low BP.  3. Septic Shock: 2/2 MSSA bacteremia. Total of 6 weeks of IV antibiotics per ID. Still treated with midodrine.  4. PAF: developed rapid rates on 11/21 and 11/23 requiring cardioversion on each occasion. Now on oral amiodarone 200mg  daily. Not considered a candidate for [place] with anemia and GI bleed. No recurrent Afib on amio.  -Remains on Amiodarone 200mg  BID  5. Ischemic cardiomyopathy: EF noted at 45-50%. Volume status maintained with dialysis. Amount of fluid removal limited by hypotension. Now off levophed. No BP lowering cardiac meds. On midodrine. BP improved today. Anticipate dialysis today  6. Anemia: s/p transfusion. Seen by GI. Hgb back down from 9.2>>8.1> 7.5>>7.1  this morning. Primary team to decide on transfusion.   7. Moderate to Severe MR: repeat echo 12/6 showed mild MR instead of moderate to severe  8. Loculated Pleural effusion: right sided effusion. Had a chest tube in placed on 12/9, dislodged 11/20/20  CXR with increasing effusion  and tube replaced.   9. ESRD on HD: followed by Nephrology  10. Pressure sacral wound: management per primary   For questions or updates,  please contact [place] Please consult [email or web address] for contact info under        Signed, Anneth Brunell [place], MD  11/23/2020, 7:50 AM

## 2020-11-23 NOTE — Progress Notes (Addendum)
[facility] NEPHROLOGY PROGRESS NOTE  Assessment/ Plan: Pt is a 59 y.o. yo male HTN, ESRD on HD admitted on 11/20 with fever, myalgia found to have NSTEMI, complicated by A. fib with RVR, bacteremia, GI bleed and VDRF.   Physical Exam: General: seen in room, more energetic, NG in place Heart:RRR, s1s2 nl Lungs: Basal rhonchi, no wheezing  Abdomen:soft, Non-tender, non-distended, rectal tube in place Extremities:No edema Dialysis Access: TDC+  AV fistula no bruit   OP HD:AF MWF 4h 450/800 81.5kg 2/2.25 bath RUE AVF Hep 5000+ 1000 mid-run - hect 5 ug tiw  - mircera 50 ugIVq 4 weeks, last 11/15 (due 11/29)  Assessment/ Plan: 1. Hypotension - resolved, off pressors.  2. Vol - up 2-3kg but euvolemic on exam. No UF today w/ HD.  3. Acute STEMI- S/P LAD PCI11/20, has otherdisease LCx/RCA.Transitioned to brilinta. Per cardiology. 4. R parapneumonic effusion: Chest tube placed then dislodged, replaced again 12/14.  Per CCM. 5. MSSA cavitary PNA / bacteremia- sp vent support. Blood cx+. Getting 6 wks IV Ancef per pharm ending 12/30/20. F/U bcx's negative. [name] of AVF neg for abscess. 6. ESRD - usual HD MWF.  Required CRRT from 11/24-12/4.  Tolerating iHD. Unable to use heparin because of GI bleed/anemia. HD today off sched, postponed from yest due to high census. HD upstairs, have d/w CCM.  7. HD access - fistulogram done 12/13 by IR here, treated 2 outflow stenoses w/ good results. Duplex recommended for flow volume and eval A-V anastomosis, have ordered.  8. PEA arrest: occurred in setting of agitation/ sedation.Brief CPR. 9. Sacral decub - moderate size, getting local WC 10. Anemia ckd/ABLA -off of anticoagulation.  Received blood transfusion, seen by GI, no plan for endoscopy. 11. Nutritoin - remains on NG tube feeds 12. Secondary hyperparathyroidism: Increased sevelamer.  Monitor phosphorus level. 13. Afib:on amio , in NSR. S/p DCCV. 14. Severe MR:per  cardiology.  Subjective: Seen in ICU, looks good. No new c/o.   Objective Vital signs in last 24 hours: Vitals:   11/23/20 0700 11/23/20 0800 11/23/20 0900 11/23/20 1000  BP: 106/64 132/66 124/75 115/75  Pulse: 95 95 94 92  Resp: 17 (!) 21 17 16   Temp:  97.8 F (36.6 C)    TempSrc:  Oral    SpO2: 97% 92% 96% 92%  Weight:      Height:       Weight change: 1.5 kg  Intake/Output Summary (Last 24 hours) at 11/23/2020 1047 Last data filed at 11/23/2020 0600 Gross per 24 hour  Intake 318.08 ml  Output 0 ml  Net 318.08 ml       Labs: Basic Metabolic Panel: Recent Labs  Lab 11/21/20 1045 11/22/20 0444 11/23/20 0425  NA 132* 130* 130*  K 4.6 4.6 5.1  CL 94* 93* 91*  CO2 28 25 23   GLUCOSE 112* 90 84  BUN 64* 76* 95*  CREATININE 7.07* 8.08* 9.78*  CALCIUM 9.0 9.0 8.9  PHOS 6.3* 5.4* 5.7*   Liver Function Tests: Recent Labs  Lab 11/16/20 1628 11/17/20 0556 11/21/20 1045 11/22/20 0444 11/23/20 0425  AST  --   --   --  18 17  ALT  --   --   --  6 <5  ALKPHOS  --   --   --  98 99  BILITOT  --   --   --  1.0 0.9  PROT 5.4*  --   --  5.1* 4.9*  ALBUMIN  --    < >  1.2* 1.2*  1.2* 1.2*   < > = values in this interval not displayed.   No results for input(s): LIPASE, AMYLASE in the last 168 hours. No results for input(s): AMMONIA in the last 168 hours. CBC: Recent Labs  Lab 11/17/20 0556 11/17/20 2324 11/19/20 0658 11/20/20 2140 11/21/20 0526 11/22/20 0444 11/23/20 0425  WBC 6.9   < > 14.0* 13.8* 9.9 10.5 10.9*  NEUTROABS 5.3  --   --   --   --   --   --   HGB 8.8*   < > 9.0* 9.2* 8.1* 7.5* 7.1*  HCT 27.5*   < > 27.9* 28.8* 25.4* 23.3* 22.0*  MCV 91.7   < > 90.0 88.6 90.4 91.7 91.7  PLT 175   < > 298 410* 388 433* 481*   < > = values in this interval not displayed.   Cardiac Enzymes: No results for input(s): CKTOTAL, CKMB, CKMBINDEX, TROPONINI in the last 168 hours. CBG: Recent Labs  Lab 11/22/20 1558 11/22/20 2025 11/23/20 0002 11/23/20 0422  11/23/20 0704  GLUCAP 99 96 94 86 77    Medications: Infusions: . sodium chloride 10 mL/hr (11/21/20 1722)  . sodium chloride Stopped (10/28/20 0954)  .  ceFAZolin (ANCEF) IV Stopped (11/22/20 1221)  . norepinephrine (LEVOPHED) Adult infusion Stopped (11/23/20 0548)    Scheduled Medications: . amiodarone  200 mg Per Tube BID  . atorvastatin  80 mg Per Tube q1800  . chlorhexidine  15 mL Mouth Rinse BID  . Chlorhexidine Gluconate Cloth  6 each Topical Q0600  . collagenase   Topical Daily  . doxercalciferol  5 mcg Intravenous Q M,W,F  . feeding supplement (NEPRO CARB STEADY)  1,000 mL Per Tube Q24H  . feeding supplement (NEPRO CARB STEADY)  237 mL Oral BID BM  . guaiFENesin  15 mL Per Tube Q4H  . heparin injection (subcutaneous)  5,000 Units Subcutaneous Q8H  . insulin aspart  0-6 Units Subcutaneous Q4H  . lactobacillus  1 g Per Tube TID WC  . mouth rinse  15 mL Mouth Rinse q12n4p  . melatonin  5 mg Per Tube QHS  . midodrine  20 mg Per Tube TID WC  . multivitamin  1 tablet Per Tube QHS  . nutrition supplement (JUVEN)  1 packet Per Tube BID BM  . pantoprazole sodium  40 mg Per Tube BID   Followed by  . [START ON 12/18/2020] pantoprazole sodium  40 mg Per Tube Daily  . polyethylene glycol  17 g Per Tube Daily  . QUEtiapine  100 mg Per Tube QHS  . sevelamer carbonate  1.6 g Per Tube TID WC  . sodium chloride flush  10 mL Intracatheter Q8H  . sodium chloride flush  10 mL Intracatheter Q8H  . sodium chloride flush  10-40 mL Intracatheter Q12H  . sodium chloride flush  3 mL Intravenous Q12H  . sodium zirconium cyclosilicate  10 g Oral BID  . sucralfate  1 g Per Tube QID  . ticagrelor  90 mg Per Tube BID    have reviewed scheduled and prn medications.

## 2020-11-23 NOTE — Progress Notes (Signed)
NAME:  Travis [name], MRN:  [id number], DOB:  [birth year], LOS: 43 ADMISSION DATE:  10/28/2020, CONSULTATION DATE:  10/29/20 REFERRING MD:  Cardiology - [name], CHIEF COMPLAINT:  Hypotension, gram positive cocci on culture, AMS  Brief History   Patient is a 59 y.o. male with history of ESRD, HTN, CAD-who presented with anterior STEMI-treated with DES to LAD-hospital course prolonged and complicated by atrial fibrillation with RVR requiring cardioversion x2,, septic shock due to MSSA bacteremia, brief PEA arrest requiring CPR, acute hypoxic respiratory failure requiring intubation, upper GI bleed with acute blood loss anemia due to severe esophagitis requiring PRBC transfusion, severe metabolic encephalopathy/ICU delirium, bilateral lung abscesses with empyema requiring chest tube insertion--stabilized in the ICU-subsequently transferred to the [facility] hospitalist service on 12/12, his chest tube was dislodged on the floor, he started developing increasing right-sided pleural effusion/empyema, patient was transferred back to ICU and chest tube was placed.   Past Medical History  HTN ESRD, HD MWF  Significant Hospital Events   Cardioversion 11/21 Cardiac cath stent to LAD 11/20  Consults:  PCCM  Nephrology ID  Procedures:  Central line 11/21 >> 11/29 Arterial line 11/21 out Repeat DCCV 11/23 11/23 ETT out HD line 11/24 RIJ >> Arterial line 11/24 out L Barrett CVC 11/30>>  Right-sided chest tube 12/14>>  Significant Diagnostic Tests:  11/24 TEE>>Left ventricular ejection fraction, by estimation, is 35 to 40%. The  left ventricle has moderately decreased function.Moderate to severe mitral regurgitation. 11/23 CT Chest>>Extensive multifocal nodular and patchy airspace disease in both lungs with a slight peripheral predominance in the upper lungs. 3.1 cm nodular consolidative opacity in the right upper lobe is cavitated. Imaging features likely related to multifocal pneumonia. Septic  emboli and metastatic disease considered less likely but not excluded. 11/22 LUE Vas Upper Extremity Doppler>> Arteriovenous fistula-Aneurysmal dilatation noted. 11/24 CT abdomen - no retroperitoneal hematoma 11/29 MR Brain>> No evidence acute intracranial abnormality, sinisitis, mastoid effusions 11/29>> MR Cervical Spine>> Given the provided history of bacteremia, facet joint septic arthritis is difficult to definitively exclude, but the lack of any surrounding marrow edema argues against this Cervical spondylosis  11/29 MR Thoracic Spine No significant marrow edema in thoracic spine , Mild thoracic spondylosis hypointense marrow signal throughout the thoracic spine, likely related to the patient's end-stage renal disease. multifocal airspace disease and cavitary pulmonary lesions. Small right pleural effusion. 11/29 MR Lumbar Spine:As noted above and Suspected cholecystolithiasis 12/11 CT chest - improving air space disease - R empyema persists but adequate chest tube position.   Micro Data:  11/21 BCx2>>Staph aureus > MSSA by BCID 11/22 BCx 2 >> neg 11/23 trach asp >> staph aureus 11/24 BCx2>> neg 11/30 BC x 2>> staph epi>> MRSA 12/2/ BCx 2 >> neg 12/9 right pleural fluid >> negative  Antimicrobials:  Zosyn 11/21 x 1 Ancef 11/21 ->   Interim history/subjective:  Afebrile, remains on 2L [place] Currently off NE as of early am/ 0500 +342/ net +4.5 (unmeasured stool occurrences)/  Wt 83.1-> 84.6 kg 10 ml of right pleural output  Ate all of his breakfast, remains on TF at 20 ml via cortrak Complains of pain/ soreness on his sacral wound.  Wife at bedside asking about hydrotherapy   Objective   Blood pressure 103/66, pulse 82, temperature 99 F (37.2 C), temperature source Oral, resp. rate (!) 22, height 5\' 11"  (1.803 m), weight 84.6 kg, SpO2 97 %.        Intake/Output Summary (Last 24 hours) at 11/23/2020 0805 Last data  filed at 11/23/2020 0600 Gross per 24 hour  Intake 337.02  ml  Output 10 ml  Net 327.02 ml   Filed Weights   11/21/20 0325 11/22/20 0600 11/23/20 0211  Weight: 89.8 kg 83.1 kg 84.6 kg   General:  Chronically ill appearing older adult male in NAD HEENT: MM pink/moist, pupils 3/reactive, right nare cortrak Neuro: AOx3, MAE CV: rr, NSR, no murmur PULM:  Non labored, CTA, right CT to 20 cm  GI: soft, NT, bs active  Extremities: warm/dry, no LE edema  Skin: no rashes   Resolved Hospital Problem list     Assessment & Plan:  Anterior STEMI s/p DES 11/20 trying to get by with brillinta alone Ischemic cardiomyopathy 40-45% Paroxysmal Afib previously on [facility] held for ongoing bleeding Recent GIB with recent EGD showing severe esophagitis and duodenal ulceration question ischemic change MSSA bacteremia thought related to HD catheter on prolonged abx therapy Loculated R parapneumonic effusion s/p chest tube with DNase and TPA 12/13 Unstagable sacral ulcer pressure injury still awaiting PT eval for hydrotherapy ESRD on HD- MWF Dysphagia, receiving trickle tube feeds Generalized deconditioning Hyponatremia Anemia of critical illness Hypotension  Cardiology following, continuing Brilinta and statin; no ASA given recurrent GI bleeding; no BB given ongoing hypotension Continue oral amiodarone, remains NSR Currently off NE, ongoing midodrine, uptitrated 12/15 to 20 mg BID.  Will see how patient tolerates iHD today and if remains hemodynamically stable may be able to transfer out of ICU today Will need to address central line access - R IJ TDC (when RUE matures) and Left subclavian  Cultures neg to date/ trend WBC/ fever curve Continue IV cefazolin Continue R chest tube, place to water seal  Intermittent CXR Ongoing pulmonary hygiene/ IS/ progress PT WOC following- hydrotherapy  Continue trickle feeds; SLP following, currently dysphagia 2 diet, if calorie counts remain up, may consider d/c cortrak H/H remains stable, continue to trend  Continue qHS  seroquel, melatonin, encourage day/night cycles iHD per nephrology, MWF schedule   Labs   CBC: Recent Labs  Lab 11/17/20 0556 11/17/20 2324 11/19/20 0658 11/20/20 2140 11/21/20 0526 11/22/20 0444 11/23/20 0425  WBC 6.9   < > 14.0* 13.8* 9.9 10.5 10.9*  NEUTROABS 5.3  --   --   --   --   --   --   HGB 8.8*   < > 9.0* 9.2* 8.1* 7.5* 7.1*  HCT 27.5*   < > 27.9* 28.8* 25.4* 23.3* 22.0*  MCV 91.7   < > 90.0 88.6 90.4 91.7 91.7  PLT 175   < > 298 410* 388 433* 481*   < > = values in this interval not displayed.    Basic Metabolic Panel: Recent Labs  Lab 11/19/20 0658 11/20/20 0500 11/20/20 2033 11/21/20 0526 11/21/20 1045 11/22/20 0444 11/23/20 0425  NA 132* 130* 128*  --  132* 130* 130*  K 5.6* 5.9* 5.6*  --  4.6 4.6 5.1  CL 93* 90* 90*  --  94* 93* 91*  CO2 24 23 22   --  28 25 23   GLUCOSE 85 86 91  --  112* 90 84  BUN 98* 121* 143*  --  64* 76* 95*  CREATININE 8.84* 10.61* 11.45*  --  7.07* 8.08* 9.78*  CALCIUM 9.5 9.5 9.2  --  9.0 9.0 8.9  MG 1.8 2.0  --  1.6*  --  2.7* 2.8*  PHOS 6.2* 7.1* 7.6*  --  6.3* 5.4* 5.7*   GFR: Estimated Creatinine Clearance:  8.8 mL/min (A) (by C-G formula based on SCr of 9.78 mg/dL (H)). Recent Labs  Lab 11/20/20 2140 11/21/20 0526 11/22/20 0444 11/23/20 0425  WBC 13.8* 9.9 10.5 10.9*    Liver Function Tests: Recent Labs  Lab 11/16/20 1628 11/17/20 0556 11/20/20 0500 11/20/20 2033 11/21/20 1045 11/22/20 0444 11/23/20 0425  AST  --   --   --   --   --  18 17  ALT  --   --   --   --   --  6 <5  ALKPHOS  --   --   --   --   --  98 99  BILITOT  --   --   --   --   --  1.0 0.9  PROT 5.4*  --   --   --   --  5.1* 4.9*  ALBUMIN  --    < > 1.3* 1.3* 1.2* 1.2*  1.2* 1.2*   < > = values in this interval not displayed.   No results for input(s): LIPASE, AMYLASE in the last 168 hours. No results for input(s): AMMONIA in the last 168 hours.  ABG    Component Value Date/Time   PHART 7.410 11/07/2020 0631   PCO2ART 35.2  11/07/2020 0631   PO2ART 58 (L) 11/07/2020 0631   HCO3 24.2 11/16/2020 2200   TCO2 27 11/16/2020 1510   ACIDBASEDEF 0.3 11/16/2020 2200   O2SAT 84.8 11/16/2020 2200     Coagulation Profile: No results for input(s): INR, PROTIME in the last 168 hours.  Cardiac Enzymes: No results for input(s): CKTOTAL, CKMB, CKMBINDEX, TROPONINI in the last 168 hours.  HbA1C: Hgb A1c MFr Bld  Date/Time Value Ref Range Status  10/28/2020 06:04 AM 4.3 (L) 4.8 - 5.6 % Final    Comment:    (NOTE) Pre diabetes:          5.7%-6.4%  Diabetes:              >6.4%  Glycemic control for   <7.0% adults with diabetes   01/30/2015 09:09 PM 5.1 4.8 - 5.6 % Final    Comment:    (NOTE)         Pre-diabetes: 5.7 - 6.4         Diabetes: >6.4         Glycemic control for adults with diabetes: <7.0     CBG: Recent Labs  Lab 11/22/20 1558 11/22/20 2025 11/23/20 0002 11/23/20 0422 11/23/20 0704  GLUCAP 99 96 94 86 77         Kennieth Rad, ACNP [facility] 11/23/2020, 8:05 AM

## 2020-11-23 NOTE — Progress Notes (Signed)
Physical Therapy Wound Treatment Patient Details  Name: Travis [name] MRN: [id number] Date of Birth: 08/01/1961  Today's Date: 11/23/2020 Time: 4536-4680 Time Calculation (min): 32 min  Subjective  Subjective: Alert and talkative today. Wants to eat [place] and dressing. Patient and Family Stated Goals: None stated Prior Treatments: Dressing changes  Pain Score: Reports pain with dressing change and pulse lavage. Breaks taken for comfort. Pt was premedicated.  Wound Assessment  Pressure Injury 11/14/20 Buttocks Mid;Right;Left Unstageable - Full thickness tissue loss in which the base of the injury is covered by slough (yellow, tan, gray, green or brown) and/or eschar (tan, brown or black) in the wound bed. (Active)  Dressing Type ABD;Barrier Film (skin prep);Gauze (Comment);Moist to dry 11/23/20 1157  Dressing Changed;Clean;Dry;Intact 11/23/20 1157  Dressing Change Frequency Daily 11/23/20 1157  State of Healing Early/partial granulation 11/23/20 1157  Site / Wound Assessment Red;Yellow;Brown;Black 11/23/20 1157  % Wound base Red or Granulating 50% 11/23/20 1157  % Wound base Yellow/Fibrinous Exudate 40% 11/23/20 1157  % Wound base Black/Eschar 10% 11/23/20 1157  % Wound base Other/Granulation Tissue (Comment) 0% 11/23/20 1157  Peri-wound Assessment Intact;Bleeding 11/23/20 1157  Wound Length (cm) 11 cm 11/21/20 1200  Wound Width (cm) 17 cm 11/21/20 1200  Wound Depth (cm) 2 cm 11/21/20 1200  Wound Surface Area (cm^2) 187 cm^2 11/21/20 1200  Wound Volume (cm^3) 374 cm^3 11/21/20 1200  Tunneling (cm) 0 11/22/20 1401  Undermining (cm) 0 11/22/20 1401  Margins Unattached edges (unapproximated) 11/23/20 1157  Drainage Amount Moderate 11/23/20 1157  Drainage Description Serosanguineous 11/23/20 1157  Treatment Debridement (Selective);Hydrotherapy (Pulse lavage);Packing (Saline gauze) 11/23/20 1157   Santyl applied to wound bed prior to applying dressing.     Hydrotherapy Pulsed  lavage therapy - wound location: Buttocks Pulsed Lavage with Suction (psi): 8 psi (4-8 due to pain) Pulsed Lavage with Suction - Normal Saline Used: 1000 mL Pulsed Lavage Tip: Tip with splash shield Selective Debridement Selective Debridement - Location: Buttocks Selective Debridement - Tools Used: Forceps;Scalpel Selective Debridement - Tissue Removed: Eschar and yellow slough   Wound Assessment and Plan  Wound Therapy - Assess/Plan/Recommendations Wound Therapy - Clinical Statement: Noted periwound area was painful with dressing removal and was bleeding. Non-adherent placed over these areas before dressing replaced. Progressing with debridement of necrotic tissue. This patient will benefit from continued hydrotherapy for selective removal of unviable tissue, to decrease bioburden, and promote wound bed healing. Wound Therapy - Functional Problem List: Global weakness Factors Delaying/Impairing Wound Healing: Immobility;Multiple medical problems Hydrotherapy Plan: Debridement;Dressing change;Patient/family education;Pulsatile lavage with suction Wound Therapy - Frequency: 6X / week Wound Therapy - Follow Up Recommendations: Skilled nursing facility Wound Plan: See above  Wound Therapy Goals- Improve the function of patient's integumentary system by progressing the wound(s) through the phases of wound healing (inflammation - proliferation - remodeling) by: Decrease Necrotic Tissue to: 20% Decrease Necrotic Tissue - Progress: Progressing toward goal Increase Granulation Tissue to: 80% Increase Granulation Tissue - Progress: Progressing toward goal Goals/treatment plan/discharge plan were made with and agreed upon by patient/family: Yes Time For Goal Achievement: 7 days Wound Therapy - Potential for Goals: Good  Goals will be updated until maximal potential achieved or discharge criteria met.  Discharge criteria: when goals achieved, discharge from hospital, MD decision/surgical  intervention, no progress towards goals, refusal/missing three consecutive treatments without notification or medical reason.  GP     Thelma Comp 11/23/2020, 12:01 PM   Rolinda Roan, PT, DPT Acute Rehabilitation Services Pager: [phone] Office: [phone]

## 2020-11-24 ENCOUNTER — Inpatient Hospital Stay (HOSPITAL_COMMUNITY): Payer: Medicare Other

## 2020-11-24 DIAGNOSIS — Z9689 Presence of other specified functional implants: Secondary | ICD-10-CM

## 2020-11-24 DIAGNOSIS — N186 End stage renal disease: Secondary | ICD-10-CM

## 2020-11-24 LAB — GLUCOSE, CAPILLARY
Glucose-Capillary: 113 mg/dL — ABNORMAL HIGH (ref 70–99)
Glucose-Capillary: 71 mg/dL (ref 70–99)
Glucose-Capillary: 73 mg/dL (ref 70–99)
Glucose-Capillary: 84 mg/dL (ref 70–99)
Glucose-Capillary: 87 mg/dL (ref 70–99)
Glucose-Capillary: 88 mg/dL (ref 70–99)

## 2020-11-24 LAB — CBC
HCT: 22.6 % — ABNORMAL LOW (ref 39.0–52.0)
Hemoglobin: 7 g/dL — ABNORMAL LOW (ref 13.0–17.0)
MCH: 28.6 pg (ref 26.0–34.0)
MCHC: 31 g/dL (ref 30.0–36.0)
MCV: 92.2 fL (ref 80.0–100.0)
Platelets: 520 10*3/uL — ABNORMAL HIGH (ref 150–400)
RBC: 2.45 MIL/uL — ABNORMAL LOW (ref 4.22–5.81)
RDW: 14.6 % (ref 11.5–15.5)
WBC: 11.7 10*3/uL — ABNORMAL HIGH (ref 4.0–10.5)
nRBC: 0 % (ref 0.0–0.2)

## 2020-11-24 LAB — MAGNESIUM: Magnesium: 2.4 mg/dL (ref 1.7–2.4)

## 2020-11-24 MED ORDER — ACETAMINOPHEN 325 MG PO TABS: 650.0000 mg | ORAL_TABLET | ORAL | Status: DC | PRN

## 2020-11-24 MED ORDER — PANTOPRAZOLE SODIUM 40 MG PO TBEC
40.0000 mg | DELAYED_RELEASE_TABLET | Freq: Every day | ORAL | Status: DC
Start: 2020-12-18 — End: 2020-11-27

## 2020-11-24 MED ORDER — MIDODRINE HCL 5 MG PO TABS
20.0000 mg | ORAL_TABLET | Freq: Three times a day (TID) | ORAL | Status: DC
Start: 2020-11-24 — End: 2020-11-27
  Administered 2020-11-24 – 2020-11-25 (×3): 20 mg via ORAL
  Filled 2020-11-24 (×3): qty 4

## 2020-11-24 MED ORDER — SEVELAMER CARBONATE 0.8 G PO PACK
1.6000 g | PACK | Freq: Three times a day (TID) | ORAL | Status: DC
  Administered 2020-11-24 – 2020-11-25 (×2): 1.6 g via ORAL
  Filled 2020-11-24 (×9): qty 2

## 2020-11-24 MED ORDER — TICAGRELOR 90 MG PO TABS
90.0000 mg | ORAL_TABLET | Freq: Two times a day (BID) | ORAL | Status: DC
Start: 2020-11-24 — End: 2020-11-27
  Administered 2020-11-24 – 2020-11-26 (×3): 90 mg via ORAL
  Filled 2020-11-24 (×3): qty 1

## 2020-11-24 MED ORDER — SENNA 8.6 MG PO TABS: 1.0000 | ORAL_TABLET | Freq: Every day | ORAL | Status: DC | PRN

## 2020-11-24 MED ORDER — AMIODARONE HCL 200 MG PO TABS
200.0000 mg | ORAL_TABLET | Freq: Two times a day (BID) | ORAL | Status: DC
  Administered 2020-11-24 – 2020-11-26 (×3): 200 mg via ORAL
  Filled 2020-11-24 (×3): qty 1

## 2020-11-24 MED ORDER — GUAIFENESIN ER 600 MG PO TB12
600.0000 mg | ORAL_TABLET | Freq: Two times a day (BID) | ORAL | Status: DC
  Administered 2020-11-24 – 2020-11-26 (×3): 600 mg via ORAL
  Filled 2020-11-24 (×3): qty 1

## 2020-11-24 MED ORDER — RENA-VITE PO TABS
1.0000 | ORAL_TABLET | Freq: Every day | ORAL | Status: DC
  Administered 2020-11-24 – 2020-11-26 (×3): 1 via ORAL
  Filled 2020-11-24 (×3): qty 1

## 2020-11-24 MED ORDER — POLYETHYLENE GLYCOL 3350 17 G PO PACK: 17.0000 g | PACK | Freq: Every day | ORAL | Status: DC

## 2020-11-24 MED ORDER — PANTOPRAZOLE SODIUM 40 MG PO TBEC
40.0000 mg | DELAYED_RELEASE_TABLET | Freq: Two times a day (BID) | ORAL | Status: DC
  Administered 2020-11-24 – 2020-11-26 (×3): 40 mg via ORAL
  Filled 2020-11-24 (×3): qty 1

## 2020-11-24 MED ORDER — SUCRALFATE 1 GM/10ML PO SUSP
1.0000 g | Freq: Four times a day (QID) | ORAL | Status: DC
  Administered 2020-11-24 – 2020-11-26 (×5): 1 g via ORAL
  Filled 2020-11-24 (×5): qty 10

## 2020-11-24 MED ORDER — SODIUM ZIRCONIUM CYCLOSILICATE 10 G PO PACK
10.0000 g | PACK | Freq: Two times a day (BID) | ORAL | Status: DC
  Administered 2020-11-24 – 2020-11-26 (×3): 10 g via ORAL
  Filled 2020-11-24 (×5): qty 1

## 2020-11-24 MED ORDER — NEPRO/CARBSTEADY PO LIQD
237.0000 mL | Freq: Three times a day (TID) | ORAL | Status: DC
  Administered 2020-11-25: 20:00:00 237 mL via ORAL

## 2020-11-24 MED ORDER — SODIUM ZIRCONIUM CYCLOSILICATE 10 G PO PACK
10.0000 g | PACK | Freq: Two times a day (BID) | ORAL | Status: DC
  Administered 2020-11-24: 09:00:00 10 g
  Filled 2020-11-24 (×2): qty 1

## 2020-11-24 MED ORDER — JUVEN PO PACK
1.0000 | PACK | Freq: Two times a day (BID) | ORAL | Status: DC
  Administered 2020-11-25: 19:00:00 1 via ORAL
  Filled 2020-11-24: qty 1

## 2020-11-24 MED ORDER — CHLORHEXIDINE GLUCONATE CLOTH 2 % EX PADS
6.0000 | MEDICATED_PAD | Freq: Every day | CUTANEOUS | Status: DC
  Administered 2020-11-25 – 2020-12-14 (×18): 6 via TOPICAL

## 2020-11-24 MED ORDER — ATORVASTATIN CALCIUM 80 MG PO TABS
80.0000 mg | ORAL_TABLET | Freq: Every day | ORAL | Status: DC
  Administered 2020-11-24 – 2020-11-25 (×2): 80 mg via ORAL
  Filled 2020-11-24 (×2): qty 1

## 2020-11-24 MED ORDER — OXYCODONE HCL 5 MG PO TABS
10.0000 mg | ORAL_TABLET | Freq: Four times a day (QID) | ORAL | Status: DC | PRN
  Administered 2020-11-24 – 2020-11-25 (×5): 10 mg via ORAL
  Filled 2020-11-24 (×5): qty 2

## 2020-11-24 MED ORDER — CLONAZEPAM 0.25 MG PO TBDP
0.5000 mg | ORAL_TABLET | Freq: Two times a day (BID) | ORAL | Status: DC | PRN
  Administered 2020-11-26: 05:00:00 0.5 mg via ORAL
  Filled 2020-11-24: qty 2

## 2020-11-24 MED ORDER — MELATONIN 5 MG PO TABS
5.0000 mg | ORAL_TABLET | Freq: Every day | ORAL | Status: DC
  Administered 2020-11-24 – 2020-11-26 (×3): 5 mg via ORAL
  Filled 2020-11-24 (×3): qty 1

## 2020-11-24 MED ORDER — FLORANEX PO PACK
1.0000 g | PACK | Freq: Three times a day (TID) | ORAL | Status: DC
  Administered 2020-11-24 – 2020-11-25 (×3): 1 g via ORAL
  Filled 2020-11-24 (×7): qty 1

## 2020-11-24 MED ORDER — QUETIAPINE FUMARATE 100 MG PO TABS
100.0000 mg | ORAL_TABLET | Freq: Every day | ORAL | Status: DC
  Administered 2020-11-24 – 2020-11-26 (×3): 100 mg via ORAL
  Filled 2020-11-24 (×3): qty 1

## 2020-11-24 NOTE — Progress Notes (Signed)
  Speech Language Pathology Treatment: Dysphagia  Patient Details Name: Travis [name] MRN: [id number] DOB: Mar 24, 1961 Today's Date: 11/24/2020 Time: 1062-6948 SLP Time Calculation (min) (ACUTE ONLY): 38 min  Assessment / Plan / Recommendation Clinical Impression  Pt has become progressively more alert, interactive, and communicative throughout the week. Today he was observed consuming advanced solids and thin liquids while sitting upright in his chair, which is certainly an improved position from the elevated side lying position that he often does in the bed due to his sacral wound. He had a single cough across PO trials that followed a sip of thin liquids, but he has been on thin liquids all week with improving status and no fevers. Pt masticated crackers and swallowed them with what appeared to be good efficiency. Recommend advancing to regular solids, thin liquids. SLP will f/u briefly for tolerance. He may not need much more acute f/u if he continues to do well now on unrestricted textures. Pt verbalized his understanding and is in agreement with the plan.    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 He describes his chest tightness, 7/10, with radiation to his back (between his scapula), he has not exerted. EKG in ED is suggestive of anterolateral ST elevations. In the ED he was given thorazine for hiccups; CXR 10/31/20 indicated New patchy right mid and lower lung infiltrate; CT chest 10/31/20 yielded results including: Extensive multifocal nodular and patchy airspace disease in both lungs with a slight peripheral predominance in the upper lungs. 3.1 cm nodular consolidative opacity in the right upper lobe is cavitated. Imaging features likely related to multifocal pneumonia. Pt intubated 11/23-12/3/21; gastroenterology consult 11/09/20  with UGI completed and results as follows: Moderately severe reflux and erosive esophagitis  with no bleeding; Hematin (altered blood/coffee-ground-like material) in the gastric antrum, in the cardia, in  the gastric fundus and in the gastric body. Fluid aspiration performed. Probable ischemic duodenitis. Normal third portion of the duodenum. The examination was otherwise normal. BSE generated.      SLP Plan  Continue with current plan of care       Recommendations  Diet recommendations: Regular;Thin liquid Liquids provided via: Cup;Straw Medication Administration: Whole meds with puree Supervision: Staff to assist with self feeding;Trained caregiver to feed patient;Intermittent supervision to cue for compensatory strategies Compensations: Slow rate;Small sips/bites Postural Changes and/or Swallow Maneuvers: Seated upright 90 degrees                Oral Care Recommendations: Oral care BID Follow up Recommendations: 24 hour supervision/assistance;Skilled Nursing facility;Inpatient Rehab SLP Visit Diagnosis: Dysphagia, oropharyngeal phase (R13.12) Plan: Continue with current plan of care       GO                Osie Bond., M.A. [place] Acute Rehabilitation Services Pager [phone] Office [phone]  11/24/2020, 11:54 AM

## 2020-11-24 NOTE — Progress Notes (Signed)
Occupational Therapy Treatment Patient Details Name: Travis [name] MRN: [id number] DOB: December 06, 1961 Today's Date: 11/24/2020    History of present illness Pt is 59 y.o. male with past medical history of end-stage renal disease on hemodialysis, essential hypertension, dilated ascending aorta and hyperlipidemia who presented on November 20 with anterior ST elevation myocardial infarction.  VDRF 11/23-12/3.  Stent placed 11/20.  Afib with RVR with cardioversion 11/21. Suspected cholecystolithiasis 11/29.   OT comments  Patient has progressed well this week toward all OT goals.  Deficits remain as listed, but his transfer status, stand tolerance and functional mobility are beginning to advance.  This in turn should begin to positively impact his ADL and toileting independence.  The patient is very motivated, and is starting to become encouraged by his progress.  He was able to participate in a stand grooming task this date.  OT will look to continue to challenge him with ADL abilities.  CIR is recommended, and OT will continue to see him in the acute setting.    Follow Up Recommendations  CIR;Supervision/Assistance - 24 hour    Equipment Recommendations  3 in 1 bedside commode;Wheelchair (measurements OT);Wheelchair cushion (measurements OT)    Recommendations for Other Services      Precautions / Restrictions Precautions Precautions: Fall Restrictions Weight Bearing Restrictions: No       Mobility Bed Mobility               General bed mobility comments: Patient sitting EOB with PT  Transfers   Equipment used: Rolling walker (2 wheeled) Transfers: Sit to/from Stand Sit to Stand: Min assist;+2 physical assistance         General transfer comment: Verbal cues to push off from the solid surface.    Balance   Sitting-balance support: Bilateral upper extremity supported Sitting balance-Leahy Scale: Fair     Standing balance support: Bilateral upper extremity  supported Standing balance-Leahy Scale: Poor                             ADL either performed or assessed with clinical judgement   ADL   Eating/Feeding: Set up;Sitting   Grooming: Wash/dry hands;Oral care;Minimal assistance;Standing                                 General ADL Comments: Pt with decreased strength, sitting balance, and activity tolerance. Noted increased sitting tolerance. Limited by pain and fatigue                       Cognition Arousal/Alertness: Awake/alert Behavior During Therapy: WFL for tasks assessed/performed Overall Cognitive Status: Within Functional Limits for tasks assessed                                 General Comments: Improving cognition noted.  Patient relies on cueing.        Exercises     Shoulder Instructions       General Comments      Pertinent Vitals/ Pain       Faces Pain Scale: Hurts little more Pain Location: bottom Pain Descriptors / Indicators: Discomfort;Sore Pain Intervention(s): Monitored during session;Repositioned  Frequency  Min 2X/week        Progress Toward Goals  OT Goals(current goals can now be found in the care plan section)  Progress towards OT goals: Progressing toward goals  Acute Rehab OT Goals Patient Stated Goal: Move better and get stronger OT Goal Formulation: With patient Time For Goal Achievement: 11/28/20 Potential to Achieve Goals: Good  Plan Discharge plan remains appropriate    Co-evaluation    PT/OT/SLP Co-Evaluation/Treatment: Yes Reason for Co-Treatment: Complexity of the patient's impairments (multi-system involvement);To address functional/ADL transfers   OT goals addressed during session: ADL's and self-care      AM-PAC OT "6 Clicks" Daily Activity     Outcome Measure   Help from another person eating meals?: A Little Help from another  person taking care of personal grooming?: A Little Help from another person toileting, which includes using toliet, bedpan, or urinal?: A Lot Help from another person bathing (including washing, rinsing, drying)?: A Lot Help from another person to put on and taking off regular upper body clothing?: A Lot Help from another person to put on and taking off regular lower body clothing?: Total 6 Click Score: 13    End of Session Equipment Utilized During Treatment: Gait belt;Rolling walker;Oxygen  OT Visit Diagnosis: Unsteadiness on feet (R26.81);Other abnormalities of gait and mobility (R26.89);Muscle weakness (generalized) (M62.81);Pain   Activity Tolerance Patient tolerated treatment well   Patient Left in chair;with call bell/phone within reach;with family/visitor present   Nurse Communication          Time: 5883-2549 OT Time Calculation (min): 31 min  Charges: OT General Charges $OT Visit: 1 Visit OT Treatments $Self Care/Home Management : 8-22 mins  11/24/2020  Rich, OTR/L  Acute Rehabilitation Services  Office:  [phone]    Metta Clines 11/24/2020, 11:39 AM

## 2020-11-24 NOTE — Progress Notes (Signed)
Physical Therapy Wound Treatment Patient Details  Name: Travis [name] MRN: [id number] Date of Birth: [birth year]  Today's Date: 11/24/2020 Time: 1530-1620 Time Calculation (min): 50 min  Subjective  Subjective: Y'all are rushing too much... Patient and Family Stated Goals: None stated Prior Treatments: Dressing changes  Pain Score: Pain Score: 3-4/10,  Premedicated.   Wound Assessment  Pressure Injury 11/14/20 Buttocks Mid;Right;Left Unstageable - Full thickness tissue loss in which the base of the injury is covered by slough (yellow, tan, gray, green or brown) and/or eschar (tan, brown or black) in the wound bed. (Active)  Wound Image   11/21/20 1451  Dressing Type ABD;Barrier Film (skin prep);Gauze (Comment);Moist to dry;Normal saline moist dressing;Other (Comment) 11/24/20 1743  Dressing Changed;Intact 11/24/20 1743  Dressing Change Frequency Daily 11/24/20 1743  State of Healing Eschar 11/24/20 1743  Site / Wound Assessment Brown;Yellow;Red 11/24/20 1743  % Wound base Red or Granulating 25% 11/24/20 1743  % Wound base Yellow/Fibrinous Exudate 65% 11/24/20 1743  % Wound base Black/Eschar 10% 11/24/20 1743  % Wound base Other/Granulation Tissue (Comment) 0% 11/24/20 1743  Peri-wound Assessment Intact;Bleeding 11/24/20 1743  Wound Length (cm) 11 cm 11/21/20 1200  Wound Width (cm) 17 cm 11/21/20 1200  Wound Depth (cm) 2 cm 11/21/20 1200  Wound Surface Area (cm^2) 187 cm^2 11/21/20 1200  Wound Volume (cm^3) 374 cm^3 11/21/20 1200  Tunneling (cm) 0 11/22/20 1401  Undermining (cm) 0 11/22/20 1401  Margins Unattached edges (unapproximated) 11/24/20 1743  Drainage Amount Moderate 11/24/20 1743  Drainage Description Sanguineous;Serous 11/24/20 1743  Treatment Cleansed;Debridement (Selective);Hydrotherapy (Pulse lavage);Packing (Saline gauze);Other (Comment) 11/24/20 1743   Santyl applied to wound bed prior to applying dressing.    Hydrotherapy Pulsed lavage therapy - wound  location: Buttocks Pulsed Lavage with Suction (psi): 8 psi (4-8 due to pain) Pulsed Lavage with Suction - Normal Saline Used: 1000 mL Pulsed Lavage Tip: Tip with splash shield Selective Debridement Selective Debridement - Location: Buttocks Selective Debridement - Tools Used: Forceps;Scalpel Selective Debridement - Tissue Removed: Eschar and yellow slough   Wound Assessment and Plan  Wound Therapy - Assess/Plan/Recommendations Wound Therapy - Clinical Statement: Again noted periwound area was painful with dressing removal and was bleeding. Non-adherent dressing not close by, but pt's wound would benefit from it on these areas before dressing replaced. Progressing with debridement of necrotic tissue. This patient will benefit from continued hydrotherapy for selective removal of unviable tissue, to decrease bioburden, and promote wound bed healing. Wound Therapy - Functional Problem List: Global weakness Factors Delaying/Impairing Wound Healing: Immobility;Multiple medical problems Hydrotherapy Plan: Debridement;Dressing change;Patient/family education;Pulsatile lavage with suction Wound Therapy - Frequency: 6X / week Wound Therapy - Follow Up Recommendations: Skilled nursing facility Wound Plan: See above  Wound Therapy Goals- Improve the function of patient's integumentary system by progressing the wound(s) through the phases of wound healing (inflammation - proliferation - remodeling) by: Decrease Necrotic Tissue to: 20% Decrease Necrotic Tissue - Progress: Progressing toward goal Increase Granulation Tissue to: 80% Increase Granulation Tissue - Progress: Progressing toward goal Goals/treatment plan/discharge plan were made with and agreed upon by patient/family: Yes Time For Goal Achievement: 7 days Wound Therapy - Potential for Goals: Good  Goals will be updated until maximal potential achieved or discharge criteria met.  Discharge criteria: when goals achieved, discharge from  hospital, MD decision/surgical intervention, no progress towards goals, refusal/missing three consecutive treatments without notification or medical reason.  GP   11/24/2020  Ginger Carne., PT Acute Rehabilitation Services [phone]  (pager) [phone]  (office)  Travis [name] 11/24/2020, 6:01 PM

## 2020-11-24 NOTE — Progress Notes (Signed)
Physical Therapy Treatment Patient Details Name: Travis [name] MRN: [id number] DOB: 1961/04/18 Today's Date: 11/24/2020    History of Present Illness Pt is 59 y.o. male with past medical history of end-stage renal disease on hemodialysis, essential hypertension, dilated ascending aorta and hyperlipidemia who presented on November 20 with anterior ST elevation myocardial infarction.  VDRF 11/23-12/3.  Stent placed 11/20.  Afib with RVR with cardioversion 11/21. Suspected cholecystolithiasis 11/29.    PT Comments    Pt admitted with above diagnosis. Pt progressing well and was able to ambulate today 30 feet with Min to mod assist +2 for safety as he has poor postural stability as well as weakness needing assist to sequence steps and RW. Pt was also able to stand at sink and perform ADL (see OT note for details).  Pt Making good progress and is ready for Rehab.  Will continue acute PT.  Pt currently with functional limitations due to balance and endurance deficits. Pt will benefit from skilled PT to increase their independence and safety with mobility to allow discharge to the venue listed below.     Follow Up Recommendations  CIR     Equipment Recommendations  Wheelchair (measurements PT);Wheelchair cushion (measurements PT);3in1 (PT)    Recommendations for Other Services       Precautions / Restrictions Precautions Precautions: Fall Restrictions Weight Bearing Restrictions: No    Mobility  Bed Mobility Overal bed mobility: Needs Assistance Bed Mobility: Rolling;Supine to Sit;Sit to Supine Rolling: Min guard Sidelying to sit: +2 for physical assistance;Min assist       General bed mobility comments: Pt needed min guard assist to roll. Min assist to sit up from sidelying with use of rail.  Transfers Overall transfer level: Needs assistance Equipment used: Rolling walker (2 wheeled) Transfers: Sit to/from Stand Sit to Stand: Min assist;+2 physical assistance          General transfer comment: Verbal cues to push off from the solid surface.  Ambulation/Gait Ambulation/Gait assistance: Mod assist;+2 physical assistance;Min assist Gait Distance (Feet): 30 Feet Assistive device: Rolling walker (2 wheeled) Gait Pattern/deviations: Step-to pattern;Decreased stride length;Shuffle;Drifts right/left;Leaning posteriorly Gait velocity: decreased Gait velocity interpretation: <1.31 ft/sec, indicative of household ambulator General Gait Details: Pt was able to ambulate with min to  mod assist with pt needing cues to stay close to RW, steer RW and for postureal stability. Pt progressed distance today.  Pt generally unsteady but was able to ambulate with assist.   Stairs             Wheelchair Mobility    Modified Rankin (Stroke Patients Only)       Balance Overall balance assessment: Needs assistance Sitting-balance support: Bilateral upper extremity supported;Feet supported Sitting balance-Leahy Scale: Fair     Standing balance support: Bilateral upper extremity supported Standing balance-Leahy Scale: Poor Standing balance comment: Requiring use of UE on RW and +2 min to mod assist.                            Cognition Arousal/Alertness: Awake/alert Behavior During Therapy: WFL for tasks assessed/performed Overall Cognitive Status: Within Functional Limits for tasks assessed Area of Impairment: Memory;Following commands;Safety/judgement;Awareness;Problem solving;Orientation;Attention                 Orientation Level: Disoriented to;Time;Situation;Place Current Attention Level: Sustained Memory: Decreased short-term memory Following Commands: Follows one step commands inconsistently Safety/Judgement: Decreased awareness of safety;Decreased awareness of deficits Awareness: Intellectual Problem Solving: Difficulty sequencing;Requires  verbal cues;Requires tactile cues;Decreased initiation;Slow processing General Comments:  Improving cognition noted.  Patient relies on cueing.      Exercises General Exercises - Lower Extremity Ankle Circles/Pumps: AROM;Both;10 reps;Seated Quad Sets: AROM;Both;10 reps;Supine Long Arc Quad: AROM;10 reps;Seated;Both Heel Slides: AAROM;Both;10 reps;Supine Hip Flexion/Marching: Both;10 reps;AROM;Seated    General Comments General comments (skin integrity, edema, etc.): VSS during treatment      Pertinent Vitals/Pain Pain Assessment: Faces Faces Pain Scale: Hurts little more Pain Location: bottom Pain Descriptors / Indicators: Discomfort;Sore Pain Intervention(s): Limited activity within patient's tolerance;Monitored during session;Repositioned    Home Living                      Prior Function            PT Goals (current goals can now be found in the care plan section) Acute Rehab PT Goals Patient Stated Goal: Move better and get stronger Progress towards PT goals: Progressing toward goals    Frequency    Min 3X/week      PT Plan Current plan remains appropriate    Co-evaluation PT/OT/SLP Co-Evaluation/Treatment: Yes Reason for Co-Treatment: Complexity of the patient's impairments (multi-system involvement);For patient/therapist safety PT goals addressed during session: Mobility/safety with mobility OT goals addressed during session: ADL's and self-care      AM-PAC PT "6 Clicks" Mobility   Outcome Measure  Help needed turning from your back to your side while in a flat bed without using bedrails?: None Help needed moving from lying on your back to sitting on the side of a flat bed without using bedrails?: A Little Help needed moving to and from a bed to a chair (including a wheelchair)?: A Lot Help needed standing up from a chair using your arms (e.g., wheelchair or bedside chair)?: A Lot Help needed to walk in hospital room?: A Lot Help needed climbing 3-5 steps with a railing? : Total 6 Click Score: 14    End of Session Equipment  Utilized During Treatment: Oxygen;Gait belt Activity Tolerance: Patient tolerated treatment well Patient left: with call bell/phone within reach;with family/visitor present;in chair;with chair alarm set Nurse Communication: Mobility status PT Visit Diagnosis: Muscle weakness (generalized) (M62.81)     Time: 9373-4287 PT Time Calculation (min) (ACUTE ONLY): 49 min  Charges:  $Gait Training: 8-22 mins $Therapeutic Activity: 8-22 mins                     Rachael Zapanta W,PT Acute Rehabilitation Services Pager:  [phone]  Office:  [place] 11/24/2020, 1:50 PM

## 2020-11-24 NOTE — Progress Notes (Signed)
Nutrition Follow-up  DOCUMENTATION CODES:   Severe malnutrition in context of chronic illness  INTERVENTION:   Agree with removal of Cortrak tube today  Nepro Shake po TID, each supplement provides 425 kcal and 19 grams protein  D/C Juven BID for now; if po intake improves enough to reduce intake of Nepro shakes then plan to resume as pt likes Juven and it will help with wound healing. For now, Pt to focus on consuming Nepro shakes as he needs additional kcals and protein  Recommend checking Vitamin C, Zinc, Vitamin A; if deficient recommend supplementing as important for wound healing  Continue Renal MVI daily   NUTRITION DIAGNOSIS:   Severe Malnutrition related to chronic illness as evidenced by severe muscle depletion,severe fat depletion.  Being addressed via supplements  GOAL:   Patient will meet greater than or equal to 90% of their needs  Progressing  MONITOR:   PO intake,Supplement acceptance,TF tolerance,Labs,Weight trends,Skin  REASON FOR ASSESSMENT:   Ventilator    ASSESSMENT:   Patient with PMH significant for HTN and ESRD in HD. Presents this admission with STEMI.  11/20 PCI to LAD 11/23Intubated 11/24 OG tube in esophagus, unable to advance 11/26 Cortrak attempted but unable to advance past 55 cm, likely at GE junction. DR advanced to duodenal bulb; TF initiated 11/30 Brown malodorous output from mouth and OG, TF held, CT negative for obstruction, ileus with no BM x 10 days. Abd xray with OG tube GE junction and Cortrak now retraced back into stomach; both needing advancement 12/01 iHD not tolerated, CRRT to resume, +large amount of liquid stool post enema, Attempted Cortrak advancement butunsuccessful 12/02 pt with large volume blood from OG tube; s/p EGD with noted severe esophagitis and erosions/ulcerations in duodenal bulb compatible with ischemia (probable ischemic duodenitis)  12/3 extubated; attempted cortrak placement unsuccessful 12/09  Chest tube inserted, SLP advanced diet to Dys 3/Nectar thick 12/14 Diet advanced to Dysphagia 2, Thins  Pt continues to improve with regards to mental status, progressively more alert, interactive and commuincative  SLP evaluated today and pt ok for regular consistency diet, thin liquids  Eating well at meals per report but no documented po intake since 12/14. Recorded po intake since 12/11 has been 75-100% of meals Pt drinking Nepro shakes, pt also likes Juven  Pt has been receiving trickle TF of Nepro at 20 ml/hr via Cortrak secondary to hypoglycemia. This has been providing 39 g of protein and 864 kcals. As po intake has been improving and pt taking supplements, recommend holding TF with removal of Cortrak. Trickle TF via NG is not a long term solution to hypoglycemia; if hypoglycemia persists post removal, recommend further work-up. From a nutrition persepective woutld encourage liberalized diet with small, frequent meals to assist with hypoglycemia. Bedtime snacks containing protein and carbs would be especially important.   Current wt 86.5 kg; weight post HD yesterday was 84.4 kg (preHD wt 86.1 kg). Noted outpatient EDW 81.5 kg  Pt is receiving hydrotherapy on unstageable sacral wound.   Labs: phosphorus 5.7 (H), potassium 5.1 (wdl), CBGs 71-113, corrected calcium 11.4 (H), albumin 1.2 Meds: hectoral, lactobacillus, renvela with meals, renal MVI  Diet Order:   Diet Order            DIET DYS 2 Room service appropriate? Yes with Assist; Fluid consistency: Thin  Diet effective now                 EDUCATION NEEDS:   No education needs have been identified  at this time  Skin:  Skin Assessment: Skin Integrity Issues: Skin Integrity Issues:: Incisions,Unstageable,Stage IV Stage II: n/a Stage IV: bilateral buttocks Unstageable: bilateral buttocks Incisions: L arm  Last BM:  12/17  Height:   Ht Readings from Last 1 Encounters:  10/28/20 5\' 11"  (1.803 m)    Weight:   Wt  Readings from Last 1 Encounters:  11/24/20 86.5 kg    BMI:  Body mass index is 26.6 kg/m.  Estimated Nutritional Needs:   Kcal:  2250-2500  Protein:  135-160 g  Fluid:  1000 mL plus UOP   Kerman Passey MS, RDN, LDN, CNSC Registered Dietitian III Clinical Nutrition RD Pager and On-Call Pager Number Located in [place]

## 2020-11-24 NOTE — Progress Notes (Signed)
[facility] NEPHROLOGY PROGRESS NOTE  Assessment/ Plan: Pt is a 59 y.o. yo male HTN, ESRD on HD admitted on 11/20 with fever, myalgia found to have NSTEMI, complicated by A. fib with RVR, bacteremia, GI bleed and VDRF.   Physical Exam: General: seen in room, more energetic, NG in place Heart:RRR, s1s2 nl Lungs: Basal rhonchi, no wheezing  Abdomen:soft, Non-tender, non-distended, rectal tube in place Extremities:No edema Dialysis Access: TDC+  AV fistula no bruit   OP HD:AF MWF 4h 450/800 81.5kg 2/2.25 bath RUE AVF Hep 5000+ 1000 mid-run - hect 5 ug tiw  - mircera 50 ugIVq 4 weeks, last 11/15 (due 11/29)  Assessment/ Plan: 1. Acute STEMI- S/P LAD PCI11/20, has otherdisease of LCx/RCA.Transitioned to brilinta. Per cardiology. 2. R parapneumonic effusion: Chest tube placed then dislodged, replaced again 12/14.  Per CCM. 3. MSSA cavitary PNA / bacteremia- sp vent support. Blood cx+. Getting 6 wks IV Ancef per pharm ending 12/30/20. F/U bcx's negative. [name] of AVF neg for abscess. 4. Hypotension - resolved, off pressors.  5. Vol - up 2-3kg but euvolemic on exam. Small UF w/ HD 1-1.5L.  6. ESRD - usual HD MWF.  Required CRRT from 11/24-12/4.  Tolerating iHD. Unable to use heparin because of GI bleed/anemia. Had HD Thursday, will plan HD tomorrow off schedule and then plan to get back on MWF next week.  7. HD access - fistulogram done 12/13 by IR here, treated 2 outflow stenoses w/ good results. Duplex recommended for flow volume and eval A-V anastomosis, have ordered.  8. PEA arrest: occurred in setting of agitation/ sedation.Brief CPR. 9. Sacral decub - moderate size, getting local WC 10. Anemia ckd/ABLA -off of anticoagulation.  Received blood transfusion, seen by GI, no plan for endoscopy. 11. Nutritoin - remains on NG tube feeds 12. Secondary hyperparathyroidism: Increased sevelamer.  Monitor phosphorus level. 13. Afib:on amio , in NSR. S/p  DCCV. 14. Severe MR:per cardiology.  Subjective: Seen in ICU, looks good. No new c/o.   Objective Vital signs in last 24 hours: Vitals:   11/24/20 0500 11/24/20 0600 11/24/20 0700 11/24/20 1100  BP: 112/65 112/69 114/61   Pulse: 85 97 99   Resp: (!) 24 (!) 22    Temp:    99 F (37.2 C)  TempSrc:      SpO2: 93% 95% 95%   Weight:  86.5 kg    Height:       Weight change: 1.5 kg  Intake/Output Summary (Last 24 hours) at 11/24/2020 1419 Last data filed at 11/24/2020 0600 Gross per 24 hour  Intake 240 ml  Output 1500 ml  Net -1260 ml       Labs: Basic Metabolic Panel: Recent Labs  Lab 11/21/20 1045 11/22/20 0444 11/23/20 0425  NA 132* 130* 130*  K 4.6 4.6 5.1  CL 94* 93* 91*  CO2 28 25 23   GLUCOSE 112* 90 84  BUN 64* 76* 95*  CREATININE 7.07* 8.08* 9.78*  CALCIUM 9.0 9.0 8.9  PHOS 6.3* 5.4* 5.7*   Liver Function Tests: Recent Labs  Lab 11/21/20 1045 11/22/20 0444 11/23/20 0425  AST  --  18 17  ALT  --  6 <5  ALKPHOS  --  98 99  BILITOT  --  1.0 0.9  PROT  --  5.1* 4.9*  ALBUMIN 1.2* 1.2*  1.2* 1.2*   No results for input(s): LIPASE, AMYLASE in the last 168 hours. No results for input(s): AMMONIA in the last 168 hours. CBC: Recent  Labs  Lab 11/20/20 2140 11/21/20 0526 11/22/20 0444 11/23/20 0425 11/24/20 0605  WBC 13.8* 9.9 10.5 10.9* 11.7*  HGB 9.2* 8.1* 7.5* 7.1* 7.0*  HCT 28.8* 25.4* 23.3* 22.0* 22.6*  MCV 88.6 90.4 91.7 91.7 92.2  PLT 410* 388 433* 481* 520*   Cardiac Enzymes: No results for input(s): CKTOTAL, CKMB, CKMBINDEX, TROPONINI in the last 168 hours. CBG: Recent Labs  Lab 11/23/20 2038 11/23/20 2318 11/24/20 0331 11/24/20 0804 11/24/20 1212  GLUCAP 91 89 87 71 113*    Medications: Infusions: . sodium chloride 10 mL/hr (11/21/20 1722)  . sodium chloride Stopped (10/28/20 0954)  .  ceFAZolin (ANCEF) IV 2 g (11/24/20 1218)    Scheduled Medications: . amiodarone  200 mg Oral BID  . atorvastatin  80 mg Oral q1800   . chlorhexidine  15 mL Mouth Rinse BID  . Chlorhexidine Gluconate Cloth  6 each Topical Q0600  . collagenase   Topical Daily  . doxercalciferol  5 mcg Intravenous Q M,W,F  . feeding supplement (NEPRO CARB STEADY)  237 mL Oral TID BM  . guaiFENesin  600 mg Oral BID  . heparin injection (subcutaneous)  5,000 Units Subcutaneous Q8H  . lactobacillus  1 g Oral TID WC  . mouth rinse  15 mL Mouth Rinse q12n4p  . melatonin  5 mg Oral QHS  . midodrine  20 mg Oral TID WC  . multivitamin  1 tablet Oral QHS  . [START ON 11/25/2020] nutrition supplement (JUVEN)  1 packet Oral BID BM  . pantoprazole  40 mg Oral BID   Followed by  . [START ON 12/18/2020] pantoprazole  40 mg Oral Daily  . [START ON 11/25/2020] polyethylene glycol  17 g Oral Daily  . QUEtiapine  100 mg Oral QHS  . sevelamer carbonate  1.6 g Oral TID WC  . sodium chloride flush  10 mL Intracatheter Q8H  . sodium chloride flush  10 mL Intracatheter Q8H  . sodium chloride flush  10-40 mL Intracatheter Q12H  . sodium chloride flush  3 mL Intravenous Q12H  . sodium zirconium cyclosilicate  10 g Oral BID  . sucralfate  1 g Oral QID  . ticagrelor  90 mg Oral BID    have reviewed scheduled and prn medications.

## 2020-11-24 NOTE — Progress Notes (Signed)
Progress Note  Patient Name: Travis [name] Date of Encounter: 11/24/2020  [facility] Cardiologist: Glenetta Hew, MD   Subjective   Alert.  States he feels great. In good spirits today. Denies chest pain.  Inpatient Medications    Scheduled Meds: . amiodarone  200 mg Per Tube BID  . atorvastatin  80 mg Per Tube q1800  . chlorhexidine  15 mL Mouth Rinse BID  . Chlorhexidine Gluconate Cloth  6 each Topical Q0600  . collagenase   Topical Daily  . doxercalciferol  5 mcg Intravenous Q M,W,F  . feeding supplement (NEPRO CARB STEADY)  1,000 mL Per Tube Q24H  . feeding supplement (NEPRO CARB STEADY)  237 mL Oral BID BM  . guaiFENesin  15 mL Per Tube Q4H  . heparin injection (subcutaneous)  5,000 Units Subcutaneous Q8H  . lactobacillus  1 g Per Tube TID WC  . mouth rinse  15 mL Mouth Rinse q12n4p  . melatonin  5 mg Per Tube QHS  . midodrine  20 mg Per Tube TID WC  . multivitamin  1 tablet Per Tube QHS  . nutrition supplement (JUVEN)  1 packet Per Tube BID BM  . pantoprazole sodium  40 mg Per Tube BID   Followed by  . [START ON 12/18/2020] pantoprazole sodium  40 mg Per Tube Daily  . polyethylene glycol  17 g Per Tube Daily  . QUEtiapine  100 mg Per Tube QHS  . sevelamer carbonate  1.6 g Per Tube TID WC  . sodium chloride flush  10 mL Intracatheter Q8H  . sodium chloride flush  10 mL Intracatheter Q8H  . sodium chloride flush  10-40 mL Intracatheter Q12H  . sodium chloride flush  3 mL Intravenous Q12H  . sodium zirconium cyclosilicate  10 g Per Tube BID  . sucralfate  1 g Per Tube QID  . ticagrelor  90 mg Per Tube BID   Continuous Infusions: . sodium chloride 10 mL/hr (11/21/20 1722)  . sodium chloride Stopped (10/28/20 0954)  .  ceFAZolin (ANCEF) IV Stopped (11/22/20 1221)  . norepinephrine (LEVOPHED) Adult infusion Stopped (11/23/20 0548)   PRN Meds: sodium chloride, acetaminophen, clonazepam, ondansetron (ZOFRAN) IV, oxyCODONE, Resource ThickenUp Clear,  sennosides, silver nitrate applicators, sodium chloride, sodium chloride flush, sodium chloride flush   Vital Signs    Vitals:   11/24/20 0400 11/24/20 0500 11/24/20 0600 11/24/20 0700  BP: 118/74 112/65 112/69 114/61  Pulse: 89 85 97 99  Resp: (!) 34 (!) 24 (!) 22   Temp:      TempSrc:      SpO2: 97% 93% 95% 95%  Weight:   86.5 kg   Height:        Intake/Output Summary (Last 24 hours) at 11/24/2020 0828 Last data filed at 11/24/2020 0600 Gross per 24 hour  Intake 240 ml  Output 1500 ml  Net -1260 ml   Last 3 Weights 11/24/2020 11/23/2020 11/23/2020  Weight (lbs) 190 lb 11.2 oz 186 lb 1.1 oz 189 lb 13.1 oz  Weight (kg) 86.5 kg 84.4 kg 86.1 kg      Telemetry    SR - Personally Reviewed  ECG    No new tracing  Physical Exam    Frail, ill appearing male GEN: No acute distress. Cortrack in place  Neck: No JVD Cardiac: RRR, no murmurs, rubs, or gallops.  Respiratory: diminished in right base, course BS. Chest tube in place GI: Soft, nontender, non-distended. Rectal tube in place MS: No edema;  No deformity. Neuro:  Nonfocal  Psych: Normal affect   Labs    High Sensitivity Troponin:   Recent Labs  Lab 10/28/20 0629 10/28/20 1046 10/28/20 2040 10/29/20 2121 10/30/20 0548  TROPONINIHS 3,155* >27,000* >27,000* >27,000* >27,000*      Chemistry Recent Labs  Lab 11/21/20 1045 11/22/20 0444 11/23/20 0425  NA 132* 130* 130*  K 4.6 4.6 5.1  CL 94* 93* 91*  CO2 28 25 23   GLUCOSE 112* 90 84  BUN 64* 76* 95*  CREATININE 7.07* 8.08* 9.78*  CALCIUM 9.0 9.0 8.9  PROT  --  5.1* 4.9*  ALBUMIN 1.2* 1.2*  1.2* 1.2*  AST  --  18 17  ALT  --  6 <5  ALKPHOS  --  98 99  BILITOT  --  1.0 0.9  GFRNONAA 8* 7* 6*  ANIONGAP 10 12 16*     Hematology Recent Labs  Lab 11/22/20 0444 11/23/20 0425 11/24/20 0605  WBC 10.5 10.9* 11.7*  RBC 2.54* 2.40* 2.45*  HGB 7.5* 7.1* 7.0*  HCT 23.3* 22.0* 22.6*  MCV 91.7 91.7 92.2  MCH 29.5 29.6 28.6  MCHC 32.2 32.3 31.0   RDW 14.7 14.6 14.6  PLT 433* 481* 520*    BNPNo results for input(s): BNP, PROBNP in the last 168 hours.   DDimer No results for input(s): DDIMER in the last 168 hours.   Radiology    No results found.  Cardiac Studies   Cath: 10/28/20    There is mild to moderate left ventricular systolic dysfunction. The left ventricular ejection fraction is 40-45% by visual estimate - > anterior-anterolateral apical hypokinesis  LV end diastolic pressure is severely elevated -> on recheck post PCI, EDP 30 mmHg  Mid LAD lesion is 99% stenosed. 1st Diag lesion is 55% stenosed just proximal to the lesion  A drug-eluting stent was successfully placed crossing the diagonal branch, using a [place] [id number]. Postdilated to 3.1 mm  Post intervention, there is a 0% residual stenosis.  LPAV lesion is 99% stenosed -> ostial lesion (LCx is 90  turn followed by another 90  turn into the AVG)  Prox RCA lesion is 80% stenosed -> concentric napkin ring calcified lesion.  SUMMARY  Acute anterolateral ST elevation MI with Culprit lesion being the 99% mid LAD lesion (just past major 1st Diag/D1) along with multivessel CAD including 99% ostial AV groove LCx and 80% calcified napkin ring proximal RCA lesion with extensive calcification throughout the RCA. ? Successful PTCA and DES PCI of the LAD crossing D1 -resolute Onyx DES 2.75 mm x 18 mm postdilated to 3.1 mm. ? With PCI, there was stabilization of significant ectopy, AIVR and PVCs  Mild to moderate reduced EF with anterior anterolateral hypokinesis, EF roughly 40 to 45%.  Initial evaluation suggested normal EDP, but on recheck post PCI LVEDP of 30 mmHg, severely elevated consistent with ACUTE DIASTOLIC HEART FAILURE  Significantly dilated aortic root requiring AL-1 guide catheter for both Left and Right Coronary Angiography.   RECOMMENDATIONS  Admit to CCU, restart IV heparin 8 hours after sheath removal  Check 2D echo for  better assessment of EF  Plan to review cath films with other IC cardiologist, anticipate staged PCI of at least the RCA which may require atherectomy versus lithotripsy, and likely the ostial AV groove circumflex.  If he has signs of dyspnea, consider earlier dialysis than tomorrow. ->Would consult for nephrology to see today.  He is already on labetalol and Norvasc. We will  continue current home medications.  Uninterrupted DAPT times X 1 year.  Glenetta Hew, M.D., M.S. Interventional Cardiologist   Diagnostic Dominance: Right    Intervention    Echo: 11/13/20  IMPRESSIONS    1. Left ventricular ejection fraction, by estimation, is 45 to 50%. The  left ventricle has mildly decreased function. The left ventricle  demonstrates regional wall motion abnormalities (see scoring  diagram/findings for description). There is severe  concentric left ventricular hypertrophy. Left ventricular diastolic  parameters are consistent with Grade I diastolic dysfunction (impaired  relaxation).  2. Right ventricular systolic function is normal. The right ventricular  size is normal. There is normal pulmonary artery systolic pressure. The  estimated right ventricular systolic pressure is 78.4 mmHg.  3. The mitral valve is degenerative. Mild mitral valve regurgitation. No  evidence of mitral stenosis.  4. The aortic valve is tricuspid. There is mild calcification of the  aortic valve. There is mild thickening of the aortic valve. Aortic valve  regurgitation is mild. Mild aortic valve sclerosis is present, with no  evidence of aortic valve stenosis.  5. The inferior vena cava is normal in size with greater than 50%  respiratory variability, suggesting right atrial pressure of 3 mmHg.   Comparison(s): Changes from prior study are noted. EF has improved to  45-50%. WMAs remain the mid septum into apex. RVSP has improved.   Patient Profile     59 y.o. male  with ESRD on HD,  HTN, tobacco abuse, CAD presenting with anterior STEMI 10/28/20 s/p placement of a stent in the LAD with residual disease in the RCA and Circumflex. He developed atrial fib with RVR. Hospital course complicated by MSSA bacteremia with shock, upper GI bleeding secondary to severe esophagitis and duodenal ulcer, ICU delirium, metabolic encephalopathy, acute hypoxemic respiratory failure, anemia.   Assessment & Plan    1. Anterior STEMI: culprit lesion noted in the mLAD 99% treated with DESx1 on 10/28/20. Initially placed on DAPT with ASA/Brilinta with plans to consider for staged intervention to the RCA. Now on monotherapy with Brilinta given significant anemia throughout admission.  Plans for staged intervention to the RCA on hold with his multiple co-morbidities, do not anticipate this being done this admission. He is having no significant angina -- continue on Brilinta and statin  2. CAD: as above s/pLAD stenting with residual disease in the RCA being treated medically at this time. No active angina. Antianginal therapy limited by low BP.  3. Septic Shock: 2/2 MSSA bacteremia. Total of 6 weeks of IV antibiotics per ID. Still treated with midodrine.  4. PAF: developed rapid rates on 11/21 and 11/23 requiring cardioversion on each occasion. Now on oral amiodarone 200mg  daily. Not considered a candidate for [place] with anemia and GI bleed. No recurrent Afib on amio.  -Remains on Amiodarone 200mg  BID  5. Ischemic cardiomyopathy: EF noted at 45-50%. Volume status maintained with dialysis. Amount of fluid removal limited by hypotension. Now off levophed. No BP lowering cardiac meds. On midodrine. BP improved today. Anticipate dialysis today  6. Anemia: s/p transfusion. Seen by GI. Hgb back down from 9.2>>8.1> 7.5>>7.1>>7.0   this morning. Primary team to decide on transfusion.   7. Moderate to Severe MR: repeat echo 12/6 showed mild MR instead of moderate to severe  8. Loculated Pleural effusion:  right sided effusion. Had a chest tube in placed on 12/9, dislodged 11/20/20  CXR with increasing effusion  and tube replaced. Little drainage. Per CCM.   9. ESRD on  HD: followed by Nephrology  10. Pressure sacral wound: management per primary   For questions or updates, please contact [place] Please consult [email or web address] for contact info under        Signed, Jeniece Hannis [place], MD  11/24/2020, 8:28 AM

## 2020-11-24 NOTE — Progress Notes (Signed)
AVF duplex completed. Refer to "CV Proc" under chart review to view preliminary results.  11/24/2020 2:56 PM Kelby Aline., MHA, RVT, RDCS, RDMS

## 2020-11-24 NOTE — Progress Notes (Signed)
Amedeo Gory, RN called and notified the patient Travis [name] has been moved to 11/25/20.

## 2020-11-24 NOTE — Progress Notes (Signed)
NAME:  Travis [name], MRN:  [id number], DOB:  [birth year], LOS: 79 ADMISSION DATE:  10/28/2020, CONSULTATION DATE:  10/29/20 REFERRING MD:  Cardiology - [name], CHIEF COMPLAINT:  Hypotension, gram positive cocci on culture, AMS  Brief History   Patient is a 59 y.o. male with history of ESRD, HTN, CAD-who presented with anterior STEMI-treated with DES to LAD-hospital course prolonged and complicated by atrial fibrillation with RVR requiring cardioversion x2,, septic shock due to MSSA bacteremia, brief PEA arrest requiring CPR, acute hypoxic respiratory failure requiring intubation, upper GI bleed with acute blood loss anemia due to severe esophagitis requiring PRBC transfusion, severe metabolic encephalopathy/ICU delirium, bilateral lung abscesses with empyema requiring chest tube insertion--stabilized in the ICU-subsequently transferred to the [facility] hospitalist service on 12/12, his chest tube was dislodged on the floor, he started developing increasing right-sided pleural effusion/empyema, patient was transferred back to ICU and chest tube was placed.   Past Medical History  HTN ESRD, HD MWF  Significant Hospital Events   Cardioversion 11/21 Cardiac cath stent to LAD 11/20  Consults:  PCCM  Nephrology ID  Procedures:  Central line 11/21 >> 11/29 Arterial line 11/21 out Repeat DCCV 11/23 11/23 ETT out HD line 11/24 RIJ >> Arterial line 11/24 out L [place] CVC 11/30>>  Right-sided chest tube 12/14>>  Significant Diagnostic Tests:  11/24 TEE>>Left ventricular ejection fraction, by estimation, is 35 to 40%. The  left ventricle has moderately decreased function.Moderate to severe mitral regurgitation. 11/23 CT Chest>>Extensive multifocal nodular and patchy airspace disease in both lungs with a slight peripheral predominance in the upper lungs. 3.1 cm nodular consolidative opacity in the right upper lobe is cavitated. Imaging features likely related to multifocal pneumonia. Septic  emboli and metastatic disease considered less likely but not excluded. 11/22 LUE Vas Upper Extremity Doppler>> Arteriovenous fistula-Aneurysmal dilatation noted. 11/24 CT abdomen - no retroperitoneal hematoma 11/29 MR Brain>> No evidence acute intracranial abnormality, sinisitis, mastoid effusions 11/29>> MR Cervical Spine>> Given the provided history of bacteremia, facet joint septic arthritis is difficult to definitively exclude, but the lack of any surrounding marrow edema argues against this Cervical spondylosis  11/29 MR Thoracic Spine No significant marrow edema in thoracic spine , Mild thoracic spondylosis hypointense marrow signal throughout the thoracic spine, likely related to the patient's end-stage renal disease. multifocal airspace disease and cavitary pulmonary lesions. Small right pleural effusion. 11/29 MR Lumbar Spine:As noted above and Suspected cholecystolithiasis 12/11 CT chest - improving air space disease - R empyema persists but adequate chest tube position.   Micro Data:  11/21 BCx2>>Staph aureus > MSSA by BCID 11/22 BCx 2 >> neg 11/23 trach asp >> staph aureus 11/24 BCx2>> neg 11/30 BC x 2>> staph epi>> MRSA 12/2/ BCx 2 >> neg 12/9 right pleural fluid >> negative  Antimicrobials:  Zosyn 11/21 x 1 Ancef 11/21 -> (stop date 12/20/2019)  Interim history/subjective:  Afebrile No further output from chest tube  1.5L off with iHD 12/16, remains +2.4, remains above dry weight (81.5kg ) at 86.5kg today No complaints other than improving buttock/ wound soreness  Has been eating 100% of meals  Objective   Blood pressure 114/61, pulse 99, temperature 98.4 F (36.9 C), temperature source Oral, resp. rate (!) 22, height 5\' 11"  (1.803 m), weight 86.5 kg, SpO2 95 %.        Intake/Output Summary (Last 24 hours) at 11/24/2020 0759 Last data filed at 11/24/2020 0600 Gross per 24 hour  Intake 240 ml  Output 1500 ml  Net -1260  ml   Filed Weights   11/23/20 1435  11/23/20 1753 11/24/20 0600  Weight: 86.1 kg 84.4 kg 86.5 kg   General:  Very pleasant, chronically ill older male lying in bed on left side in NAD HEENT: MM pink/moist, right nare cortrak, pupils 3/reactive Neuro: AOx 4, MAE CV: rr, no murmur, NSR PULM:  Non labored on room air, CTA, right CT to waterseal- no output/ no leak GI: soft, bs+, anuric,  Extremities: warm/dry, +1 pedal edema  Skin: no rashes, buttock wound not visualized  Resolved Hospital Problem list     Assessment & Plan:  Anterior STEMI s/p DES 11/20 trying to get by with brillinta alone Ischemic cardiomyopathy 40-45% Paroxysmal Afib previously on [facility] held for ongoing bleeding Recent GIB with recent EGD showing severe esophagitis and duodenal ulceration question ischemic change MSSA bacteremia thought related to HD catheter on prolonged abx therapy Loculated R parapneumonic effusion s/p chest tube with DNase and TPA 12/13 Unstagable sacral ulcer pressure injury still awaiting PT eval for hydrotherapy ESRD on HD- MWF Dysphagia, receiving trickle tube feeds Generalized deconditioning Hyponatremia Anemia of critical illness Hypotension P: Cardiology following, continuing Brilinta and statin; no ASA given recurrent GI bleeding; no BB given ongoing hypotension Continue oral amiodarone, remains NSR Remains off NE since 12/16 am, ongoing midodrine, continue 20 mg BID. Tolerated iHD 12/16 off pressors;  Will hold off on decreasing dose as patient is 5kg above dry weight and to allow BP room for fluid removal Will try and place 2 PIV and d/c left IJ CVL- not needed anymore Continue IV cefazolin - total of 6 weeks, stop date 12/19/2020 CXR this am, right CT has been to water seal since 12/16 without drainage, consider d/c today  Continue to follow pleural cultures/ trend WBC/ fever curve Intermittent CXR Ongoing pulmonary hygiene/ IS/ progress PT WOC following- ongoing hydrotherapy  Remove cortrak today Continue diet per  SLP recs, currently dysphagia 2 diet H/H with slow drift, transfuse for Hgb < 7- not symptomatic or evidence of bleeding Continues on PPI BID for thirty days-> then daily on 1/10 Continue qHS seroquel, melatonin, encourage day/night cycles iHD per Nephrology, last iHD 12/16, normal MWF schedule, continue R IJ TDC/  LUE AVF per Nephrology   Has remained hemodynamically stable; will transfer to PCU today and to [facility] as of 12/18; PCCM will be available as needed.   Labs   CBC: Recent Labs  Lab 11/20/20 2140 11/21/20 0526 11/22/20 0444 11/23/20 0425 11/24/20 0605  WBC 13.8* 9.9 10.5 10.9* 11.7*  HGB 9.2* 8.1* 7.5* 7.1* 7.0*  HCT 28.8* 25.4* 23.3* 22.0* 22.6*  MCV 88.6 90.4 91.7 91.7 92.2  PLT 410* 388 433* 481* 520*    Basic Metabolic Panel: Recent Labs  Lab 11/20/20 0500 11/20/20 2033 11/21/20 0526 11/21/20 1045 11/22/20 0444 11/23/20 0425 11/24/20 0605  NA 130* 128*  --  132* 130* 130*  --   K 5.9* 5.6*  --  4.6 4.6 5.1  --   CL 90* 90*  --  94* 93* 91*  --   CO2 23 22  --  28 25 23   --   GLUCOSE 86 91  --  112* 90 84  --   BUN 121* 143*  --  64* 76* 95*  --   CREATININE 10.61* 11.45*  --  7.07* 8.08* 9.78*  --   CALCIUM 9.5 9.2  --  9.0 9.0 8.9  --   MG 2.0  --  1.6*  --  2.7* 2.8* 2.4  PHOS 7.1* 7.6*  --  6.3* 5.4* 5.7*  --    GFR: Estimated Creatinine Clearance: 8.8 mL/min (A) (by C-G formula based on SCr of 9.78 mg/dL (H)). Recent Labs  Lab 11/21/20 0526 11/22/20 0444 11/23/20 0425 11/24/20 0605  WBC 9.9 10.5 10.9* 11.7*    Liver Function Tests: Recent Labs  Lab 11/20/20 0500 11/20/20 2033 11/21/20 1045 11/22/20 0444 11/23/20 0425  AST  --   --   --  18 17  ALT  --   --   --  6 <5  ALKPHOS  --   --   --  98 99  BILITOT  --   --   --  1.0 0.9  PROT  --   --   --  5.1* 4.9*  ALBUMIN 1.3* 1.3* 1.2* 1.2*  1.2* 1.2*   No results for input(s): LIPASE, AMYLASE in the last 168 hours. No results for input(s): AMMONIA in the last 168 hours.  ABG     Component Value Date/Time   PHART 7.410 11/07/2020 0631   PCO2ART 35.2 11/07/2020 0631   PO2ART 58 (L) 11/07/2020 0631   HCO3 24.2 11/16/2020 2200   TCO2 27 11/16/2020 1510   ACIDBASEDEF 0.3 11/16/2020 2200   O2SAT 84.8 11/16/2020 2200     Coagulation Profile: No results for input(s): INR, PROTIME in the last 168 hours.  Cardiac Enzymes: No results for input(s): CKTOTAL, CKMB, CKMBINDEX, TROPONINI in the last 168 hours.  HbA1C: Hgb A1c MFr Bld  Date/Time Value Ref Range Status  10/28/2020 06:04 AM 4.3 (L) 4.8 - 5.6 % Final    Comment:    (NOTE) Pre diabetes:          5.7%-6.4%  Diabetes:              >6.4%  Glycemic control for   <7.0% adults with diabetes   01/30/2015 09:09 PM 5.1 4.8 - 5.6 % Final    Comment:    (NOTE)         Pre-diabetes: 5.7 - 6.4         Diabetes: >6.4         Glycemic control for adults with diabetes: <7.0     CBG: Recent Labs  Lab 11/23/20 0704 11/23/20 1145 11/23/20 2038 11/23/20 2318 11/24/20 0331  GLUCAP 77 87 91 89 87         Kennieth Rad, ACNP [facility] 11/24/2020, 7:59 AM

## 2020-11-25 LAB — GLUCOSE, CAPILLARY
Glucose-Capillary: 69 mg/dL — ABNORMAL LOW (ref 70–99)
Glucose-Capillary: 74 mg/dL (ref 70–99)
Glucose-Capillary: 67 mg/dL — ABNORMAL LOW (ref 70–99)
Glucose-Capillary: 65 mg/dL — ABNORMAL LOW (ref 70–99)
Glucose-Capillary: 87 mg/dL (ref 70–99)

## 2020-11-25 LAB — BASIC METABOLIC PANEL
Anion gap: 12 (ref 5–15)
BUN: 76 mg/dL — ABNORMAL HIGH (ref 6–20)
CO2: 26 mmol/L (ref 22–32)
Calcium: 9.2 mg/dL (ref 8.9–10.3)
Chloride: 94 mmol/L — ABNORMAL LOW (ref 98–111)
Creatinine, Ser: 8.66 mg/dL — ABNORMAL HIGH (ref 0.61–1.24)
GFR, Estimated: 7 mL/min — ABNORMAL LOW (ref >60)
Glucose, Bld: 79 mg/dL (ref 70–99)
Potassium: 5.1 mmol/L (ref 3.5–5.1)
Sodium: 132 mmol/L — ABNORMAL LOW (ref 135–145)

## 2020-11-25 LAB — CBC
HCT: 21.5 % — ABNORMAL LOW (ref 39.0–52.0)
Hemoglobin: 6.7 g/dL — CL (ref 13.0–17.0)
MCH: 28.3 pg (ref 26.0–34.0)
MCHC: 31.2 g/dL (ref 30.0–36.0)
MCV: 90.7 fL (ref 80.0–100.0)
Platelets: 488 10*3/uL — ABNORMAL HIGH (ref 150–400)
RBC: 2.37 MIL/uL — ABNORMAL LOW (ref 4.22–5.81)
RDW: 14.2 % (ref 11.5–15.5)
WBC: 14.8 10*3/uL — ABNORMAL HIGH (ref 4.0–10.5)
nRBC: 0 % (ref 0.0–0.2)

## 2020-11-25 LAB — HEMOGLOBIN AND HEMATOCRIT, BLOOD
HCT: 24.6 % — ABNORMAL LOW (ref 39.0–52.0)
Hemoglobin: 7.8 g/dL — ABNORMAL LOW (ref 13.0–17.0)

## 2020-11-25 LAB — MAGNESIUM: Magnesium: 2.3 mg/dL (ref 1.7–2.4)

## 2020-11-25 LAB — PREPARE RBC (CROSSMATCH)

## 2020-11-25 MED ORDER — DARBEPOETIN ALFA 60 MCG/0.3ML IJ SOSY
60.0000 ug | PREFILLED_SYRINGE | INTRAMUSCULAR | Status: DC
  Filled 2020-11-25: qty 0.3

## 2020-11-25 MED ORDER — SODIUM CHLORIDE 0.9 % IV SOLN: 100.0000 mL | INTRAVENOUS | Status: DC | PRN

## 2020-11-25 MED ORDER — SODIUM CHLORIDE 0.9% IV SOLUTION: Freq: Once | INTRAVENOUS | Status: DC

## 2020-11-25 MED ORDER — DOXERCALCIFEROL 4 MCG/2ML IV SOLN
INTRAVENOUS | Status: AC
  Filled 2020-11-25: qty 4

## 2020-11-25 MED ORDER — HEPARIN SODIUM (PORCINE) 1000 UNIT/ML IJ SOLN
INTRAMUSCULAR | Status: AC
  Administered 2020-11-25: 1000 [IU]
  Filled 2020-11-25: qty 3

## 2020-11-25 MED ORDER — DARBEPOETIN ALFA 60 MCG/0.3ML IJ SOSY
PREFILLED_SYRINGE | INTRAMUSCULAR | Status: AC
  Administered 2020-11-25: 60 ug via INTRAVENOUS
  Filled 2020-11-25: qty 0.3

## 2020-11-25 MED ORDER — HEPARIN SODIUM (PORCINE) 1000 UNIT/ML DIALYSIS: 1000.0000 [IU] | INTRAMUSCULAR | Status: DC | PRN

## 2020-11-25 MED ORDER — ALTEPLASE 2 MG IJ SOLR: 2.0000 mg | Freq: Once | INTRAMUSCULAR | Status: DC | PRN

## 2020-11-25 MED ORDER — PENTAFLUOROPROP-TETRAFLUOROETH EX AERO: 1.0000 "application " | INHALATION_SPRAY | CUTANEOUS | Status: DC | PRN

## 2020-11-25 MED ORDER — HEPARIN SODIUM (PORCINE) 1000 UNIT/ML IJ SOLN
INTRAMUSCULAR | Status: AC
  Filled 2020-11-25: qty 1

## 2020-11-25 MED ORDER — LIDOCAINE HCL (PF) 1 % IJ SOLN: 5.0000 mL | INTRAMUSCULAR | Status: DC | PRN

## 2020-11-25 MED ORDER — LIDOCAINE-PRILOCAINE 2.5-2.5 % EX CREA: 1.0000 "application " | TOPICAL_CREAM | CUTANEOUS | Status: DC | PRN

## 2020-11-25 NOTE — Progress Notes (Signed)
eLink Physician-Brief Progress Note Patient Name: Travis [name] DOB: Oct 10, 1961 MRN: [id number]   Date of Service  11/25/2020  HPI/Events of Note  Hemoglobin 6.7 gm, down from 7.1 gm / dl on last measurement. Patient had some bleeding from his sacral wound.  eICU Interventions  Transfuse 1 unit PRBC.        Kerry Kass Darshan Solanki 11/25/2020, 1:55 AM

## 2020-11-25 NOTE — Progress Notes (Signed)
PT Hydrotherapy Cancellation Note  Patient Details Name: AIDDEN MARKOVIC MRN: [id number] DOB: 1961/11/29   Cancelled Treatment:    Reason Eval/Treat Not Completed: Patient at procedure or test/unavailable (HD)  Wyona Almas, PT, DPT Acute Rehabilitation Services Pager [phone] Office [phone]    Deno Etienne 11/25/2020, 12:58 PM

## 2020-11-25 NOTE — Progress Notes (Signed)
Progress Note    Travis [name]  MCN:[id number] DOB: 1961-10-08  DOA: 10/28/2020 PCP: London Pepper, MD    Brief Narrative:     Medical records reviewed and are as summarized below:  Travis [name] is an 59 y.o. male with history of ESRD, HTN, CAD-who presented with anterior STEMI-treated with DES to LAD-hospital course prolonged and complicated by atrial fibrillation with RVR requiring cardioversion x2,, septic shock due to MSSA bacteremia, brief PEA arrest requiring CPR, acute hypoxic respiratory failure requiring intubation, upper GI bleed with acute blood loss anemia due to severe esophagitis requiring PRBC transfusion, severe metabolic encephalopathy/ICU delirium, bilateral lung abscesses with empyema requiring chest tube insertion--stabilized in the ICU-subsequently transferred to the [facility] on 12/12, his chest tube was dislodged on the floor, he started developing increasing right-sided pleural effusion/empyema, patient was transferred back to ICU and chest tube was placed.  He was transferred back to [facility] on 12/18.   Assessment/Plan:   Principal Problem:   Acute ST elevation myocardial infarction (STEMI) of anterolateral wall (HCC) Active Problems:   ESRD (end stage renal disease) (HCC)   UGIB (upper gastrointestinal bleed)   Essential hypertension   Hyperlipidemia with target LDL less than 70   3 V- CAD w/ ACS/STEMI: Culprit = 99% mLAD @ D1 (DES PCI jailing D1); 99% ost-AVGLCx, 80% calcified napkin ring prox RCA.    Presence of drug coated stent in LAD coronary artery: Resolute Onyx DES 2.75 mm x 18 mm (3.1 mm) at major D1 & SP1.   Acute combined systolic and diastolic heart failure (HCC)   Atrial fibrillation with RVR (HCC)   Bacteremia   Septic shock due to Staphylococcus aureus [facility])   Cardiac arrest ([place])   Pneumonia of right lower lobe due to methicillin susceptible Staphylococcus aureus (MSSA) (HCC)   Severe mitral regurgitation   Acute  myocardial infarction (HCC)   Acute ST elevation myocardial infarction (STEMI) involving left anterior descending (LAD) coronary artery (HCC)   Pressure injury of skin   Protein-calorie malnutrition, severe   Pleural effusion   Empyema (HCC)   Chest tube in place   Acute STEMI with LAD stent -on Brilinta. -cardiology - Associated ischemic cardiomyopathy.    paroxysmal atrial fibrillation  - anticoagulation  held given GI bleeding during this hospitalization -amiodarone  MSSA bacteremia - Planning for cefazolin for 6 weeks total  Right parapneumonic effusion, loculated but improved drainage after DNase/TPA -chest tube removed   End-stage renal disease.   -Dialysis as per nephrology   Pressure wound- sacral area, not present on admission Pressure Injury 11/12/20 Buttocks Bilateral;Medial Stage 4 - Full thickness tissue loss with exposed bone, tendon or muscle. (Active)  11/12/20 0800  Location: Buttocks  Location Orientation: Bilateral;Medial  Staging: Stage 4 - Full thickness tissue loss with exposed bone, tendon or muscle.  Wound Description (Comments):   Present on Admission:      Pressure Injury 11/14/20 Buttocks Mid;Right;Left Unstageable - Full thickness tissue loss in which the base of the injury is covered by slough (yellow, tan, gray, green or brown) and/or eschar (tan, brown or black) in the wound bed. (Active)  11/14/20 1128  Location: Buttocks  Location Orientation: Mid;Right;Left  Staging: Unstageable - Full thickness tissue loss in which the base of the injury is covered by slough (yellow, tan, gray, green or brown) and/or eschar (tan, brown or black) in the wound bed.  Wound Description (Comments):   Present on Admission:   -having issues with bleeding -  PT hydrotherapy to see if they can apply silver nitrate -s/p 1 unit PRBC  Anemia of CD and ABLA (GI bleed and bleeding from wound) -s/p 1 unit PRBC on 12/18     Family Communication/Anticipated D/C  date and plan/Code Status   DVT prophylaxis: heparin Code Status: Full Code.  Disposition Plan: Status is: Inpatient  Remains inpatient appropriate because:Ongoing diagnostic testing needed not appropriate for outpatient work up   Dispo: The patient is from: Home              Anticipated d/c is to: tbd              Anticipated d/c date is: > 3 days              Patient currently is not medically stable to d/c.         Medical Consultants:    ID  PCCM  Renal  Cards  IR  WOC     Subjective:   Pain in wound area  Objective:    Vitals:   11/25/20 1016 11/25/20 1045 11/25/20 1115 11/25/20 1145  BP: 118/71 130/75 130/70 102/65  Pulse: 86 88 86 86  Resp: 20 19 19 19   Temp:      TempSrc:      SpO2:      Weight:      Height:        Intake/Output Summary (Last 24 hours) at 11/25/2020 1349 Last data filed at 11/25/2020 9702 Gross per 24 hour  Intake 1158 ml  Output --  Net 1158 ml   Filed Weights   11/24/20 0600 11/25/20 0650 11/25/20 0850  Weight: 86.5 kg 85.8 kg 79.9 kg    Exam:  General: Appearance:    Chronically ill appearing male in no acute distress     Lungs:     No wheezing respirations unlabored  Heart:    Normal heart rate. irr  MS:   All extremities are intact.    Bleeding coming from decubitus wound- 12-1 o'clock position-- unable to see source    Data Reviewed:   I have personally reviewed following labs and imaging studies:  Labs: Labs show the following:   Basic Metabolic Panel: Recent Labs  Lab 11/20/20 0500 11/20/20 2033 11/21/20 0526 11/21/20 1045 11/22/20 0444 11/23/20 0425 11/24/20 0605 11/25/20 0103  NA 130* 128*  --  132* 130* 130*  --  132*  K 5.9* 5.6*  --  4.6 4.6 5.1  --  5.1  CL 90* 90*  --  94* 93* 91*  --  94*  CO2 23 22  --  28 25 23   --  26  GLUCOSE 86 91  --  112* 90 84  --  79  BUN 121* 143*  --  64* 76* 95*  --  76*  CREATININE 10.61* 11.45*  --  7.07* 8.08* 9.78*  --  8.66*  CALCIUM 9.5  9.2  --  9.0 9.0 8.9  --  9.2  MG 2.0  --  1.6*  --  2.7* 2.8* 2.4 2.3  PHOS 7.1* 7.6*  --  6.3* 5.4* 5.7*  --   --    GFR Estimated Creatinine Clearance: 9.9 mL/min (A) (by C-G formula based on SCr of 8.66 mg/dL (H)). Liver Function Tests: Recent Labs  Lab 11/20/20 0500 11/20/20 2033 11/21/20 1045 11/22/20 0444 11/23/20 0425  AST  --   --   --  18 17  ALT  --   --   --  6 <5  ALKPHOS  --   --   --  98 99  BILITOT  --   --   --  1.0 0.9  PROT  --   --   --  5.1* 4.9*  ALBUMIN 1.3* 1.3* 1.2* 1.2*  1.2* 1.2*   No results for input(s): LIPASE, AMYLASE in the last 168 hours. No results for input(s): AMMONIA in the last 168 hours. Coagulation profile No results for input(s): INR, PROTIME in the last 168 hours.  CBC: Recent Labs  Lab 11/21/20 0526 11/22/20 0444 11/23/20 0425 11/24/20 0605 11/25/20 0103  WBC 9.9 10.5 10.9* 11.7* 14.8*  HGB 8.1* 7.5* 7.1* 7.0* 6.7*  HCT 25.4* 23.3* 22.0* 22.6* 21.5*  MCV 90.4 91.7 91.7 92.2 90.7  PLT 388 433* 481* 520* 488*   Cardiac Enzymes: No results for input(s): CKTOTAL, CKMB, CKMBINDEX, TROPONINI in the last 168 hours. BNP (last 3 results) No results for input(s): PROBNP in the last 8760 hours. CBG: Recent Labs  Lab 11/24/20 2032 11/24/20 2341 11/25/20 0328 11/25/20 0424 11/25/20 0815  GLUCAP 88 73 69* 74 67*   D-Dimer: No results for input(s): DDIMER in the last 72 hours. Hgb A1c: No results for input(s): HGBA1C in the last 72 hours. Lipid Profile: No results for input(s): CHOL, HDL, LDLCALC, TRIG, CHOLHDL, LDLDIRECT in the last 72 hours. Thyroid function studies: No results for input(s): TSH, T4TOTAL, T3FREE, THYROIDAB in the last 72 hours.  Invalid input(s): FREET3 Anemia work up: No results for input(s): VITAMINB12, FOLATE, FERRITIN, TIBC, IRON, RETICCTPCT in the last 72 hours. Sepsis Labs: Recent Labs  Lab 11/22/20 0444 11/23/20 0425 11/24/20 0605 11/25/20 0103  WBC 10.5 10.9* 11.7* 14.8*     Microbiology Recent Results (from the past 240 hour(s))  Body fluid culture     Status: None   Collection Time: 11/16/20  4:28 PM   Specimen: Pleura; Body Fluid  Result Value Ref Range Status   Specimen Description PLEURAL  Final   Special Requests Pleural R  Final   Gram Stain   Final    RARE WBC PRESENT, PREDOMINANTLY MONONUCLEAR NO ORGANISMS SEEN    Culture   Final    NO GROWTH 3 DAYS Performed at [facility], [address] [place], [place] [address]    Report Status 11/20/2020 FINAL  Final    Procedures and diagnostic studies:  DG Chest Port 1 View  Result Date: 11/24/2020 CLINICAL DATA:  Chest tube.  Follow-up pleural effusion. EXAM: PORTABLE CHEST 1 VIEW COMPARISON:  11/22/2020 FINDINGS: Pigtailed chest tube on the right has partially pulled out in there appear to be side holes outside of the chest. Small right pleural effusion and right lower lobe airspace disease unchanged. No pneumothorax Feeding tube enters the stomach with the tip not visualized. Right jugular central venous catheter tip in the right atrium unchanged. Left subclavian central venous catheter tip in the left innominate vein unchanged. Progressive left lower lobe consolidation without significant pleural effusion. Pulmonary vascular congestion appears to have progressed. IMPRESSION: Right basilar pigtail chest tube is partially pulled out of the chest. No pneumothorax Progressive bilateral airspace disease likely due to edema. Progressive left lower lobe consolidation. Electronically Signed   By: Franchot Gallo M.D.   On: 11/24/2020 09:00   VAS US DUPLEX DIALYSIS ACCESS (AVF, AVG)  Result Date: 11/24/2020 DIALYSIS ACCESS Reason for Exam: Follow up s/p ultrasound guided left upper extremity  fistulagram and treatment of two tandem outflow stenoses. Access Site: Left Upper Extremity. Access Type: Basilic vein transposition. Limitations: sutures Comparison Study: 10/30/2020- AVF duplex  Performing Technologist: Maudry Mayhew MHA, RDMS, RVT, RDCS  Examination Guidelines: A complete evaluation includes B-mode imaging, spectral Doppler, color Doppler, and power Doppler as needed of all accessible portions of each vessel. Unilateral testing is considered an integral part of a complete examination. Limited examinations for reoccurring indications may be performed as noted.  Findings: +--------------------+----------+-----------------+--------+ AVF                 PSV (cm/s)Flow Vol (mL/min)Comments +--------------------+----------+-----------------+--------+ Native artery inflow   114          2190                +--------------------+----------+-----------------+--------+ AVF Anastomosis        352                              +--------------------+----------+-----------------+--------+  +------------+----------+-------------+----------+-----------------------------+ OUTFLOW VEINPSV (cm/s)Diameter (cm)Depth (cm)          Describe            +------------+----------+-------------+----------+-----------------------------+ Prox UA        287                            2.1cm diameter, thrombosed                                                  with 0.8cm residual lumen   +------------+----------+-------------+----------+-----------------------------+ Mid UA         110        2.36                                             +------------+----------+-------------+----------+-----------------------------+ Dist UA        572        2.46                                             +------------+----------+-------------+----------+-----------------------------+ AC Fossa       365                                                         +------------+----------+-------------+----------+-----------------------------+   Summary: 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 Arteriovenous  fistula-Stenosis noted at the anastomosis. *See table(s) above for measurements and observations.  Diagnosing physician: Monica Martinez MD Electronically signed by Monica Martinez MD on 11/24/2020 at 5:04:39 PM.    --------------------------------------------------------------------------------   Final     Medications:   . sodium chloride   Intravenous Once  . sodium chloride   Intravenous Once  . amiodarone  200 mg Oral BID  . atorvastatin  80 mg Oral q1800  . chlorhexidine  15 mL Mouth Rinse BID  . Chlorhexidine  Gluconate Cloth  6 each Topical [id number]  . Chlorhexidine Gluconate Cloth  6 each Topical Q0600  . collagenase   Topical Daily  . [START ON 12/02/2020] darbepoetin (ARANESP) injection - DIALYSIS  60 mcg Intravenous Q Sat-HD  . doxercalciferol  5 mcg Intravenous Q M,W,F  . feeding supplement (NEPRO CARB STEADY)  237 mL Oral TID BM  . guaiFENesin  600 mg Oral BID  . heparin injection (subcutaneous)  5,000 Units Subcutaneous Q8H  . heparin sodium (porcine)      . lactobacillus  1 g Oral TID WC  . mouth rinse  15 mL Mouth Rinse q12n4p  . melatonin  5 mg Oral QHS  . midodrine  20 mg Oral TID WC  . multivitamin  1 tablet Oral QHS  . nutrition supplement (JUVEN)  1 packet Oral BID BM  . pantoprazole  40 mg Oral BID   Followed by  . [START ON 12/18/2020] pantoprazole  40 mg Oral Daily  . polyethylene glycol  17 g Oral Daily  . QUEtiapine  100 mg Oral QHS  . sevelamer carbonate  1.6 g Oral TID WC  . sodium chloride flush  10 mL Intracatheter Q8H  . sodium chloride flush  10-40 mL Intracatheter Q12H  . sodium chloride flush  3 mL Intravenous Q12H  . sodium zirconium cyclosilicate  10 g Oral BID  . sucralfate  1 g Oral QID  . ticagrelor  90 mg Oral BID   Continuous Infusions: . sodium chloride 10 mL/hr (11/21/20 1722)  . sodium chloride Stopped (10/28/20 0954)  .  ceFAZolin (ANCEF) IV 2 g (11/24/20 1218)     LOS: 28 days   Geradine Girt  [facility]   How to  contact the [facility] Attending or Consulting provider [place] or covering provider during after hours [place], for this patient?  1. Check the care team in [facility] and look for a) attending/consulting [facility] provider listed and b) the [facility] team listed 2. Log into [email or web address] and use 's universal password to access. If you do not have the password, please contact the hospital operator. 3. Locate the [facility] provider you are looking for under [facility] and page to a number that you can be directly reached. 4. If you still have difficulty reaching the provider, please page the [facility] (Director on Call) for the Hospitalists listed on amion for assistance.  11/25/2020, 1:49 PM

## 2020-11-25 NOTE — Progress Notes (Addendum)
[facility] NEPHROLOGY PROGRESS NOTE  Assessment/ Plan: Pt is a 59 y.o. yo male HTN, ESRD on HD admitted on 11/20 with fever, myalgia found to have NSTEMI, complicated by A. fib with RVR, bacteremia, GI bleed and VDRF.   Physical Exam: General: seen in HD,  NG in place Heart:RRR, s1s2 nl Lungs: Basal rhonchi, no wheezing  Abdomen:soft, Non-tender, non-distended, rectal tube in place Extremities:No edema Dialysis Access: TDC+  AV fistula no bruit   OP HD:AF MWF 4h 450/800 81.5kg 2/2.25 bath RUE AVF Hep 5000+ 1000 mid-run - hect 5 ug tiw  - mircera 50 ugIVq 4 weeks, last 11/15 (due 11/29)  Assessment/ Plan: 1. Acute STEMI- S/P LAD PCI11/20, has otherdisease of LCx/RCA.Transitioned to brilinta. Per cardiology. 2. R parapneumonic effusion: Chest tube placed then dislodged, replaced again 12/14.  Per CCM. 3. MSSA cavitary PNA / bacteremia- sp vent support. Blood cx+. Getting 6 wks IV Ancef per pharm ending 12/30/20. F/U bcx's negative. [name] of AVF neg for abscess. 4. Hypotension - resolved, off pressors.  5. Vol - 1-2 kg under dry, no edema on exam, min UF 1 L 6. ESRD - usual HD MWF.  Required CRRT from 11/24-12/4.  Tolerating iHD. Unable to use heparin because of GI bleed/anemia. HD today off sched then resume MWF.  7. HD access - fistulogram done 12/13 by IR here, treated 2 outflow stenoses w/ good results. Duplex ordered for flow volume and eval A-V anastomosis per [facility] request. Will have temp cath removed as it has been in for over 3 weeks now (placed 11/24). Use AVF from here forward.  8. PEA arrest: occurred in setting of agitation/ sedation.Brief CPR. 9. Sacral decub - moderate size, getting local WC 10. Anemia ckd/ABLA -off of anticoagulation.  Received blood transfusion. Seen by GI, EGD 12/2 showed duodenal ulcers and esophagitis, on PPI for NG tube.  11. Nutritoin - remains on NG tube feeds 12. Secondary hyperparathyroidism: Increased sevelamer.   Monitor phosphorus level. 13. Afib:on amio , in NSR. S/p DCCV. 14. Severe MR:per cardiology.  Subjective: Seen on HD, no new /co's.   Objective Vital signs in last 24 hours: Vitals:   11/25/20 0915 11/25/20 0945 11/25/20 1016 11/25/20 1045  BP: 114/69 108/68 118/71 130/75  Pulse: 90 82 86 88  Resp: 20 (!) 22 20 19   Temp:      TempSrc:      SpO2:      Weight:      Height:       Weight change: -0.3 kg  Intake/Output Summary (Last 24 hours) at 11/25/2020 1141 Last data filed at 11/25/2020 2536 Gross per 24 hour  Intake 1158 ml  Output --  Net 1158 ml       Labs: Basic Metabolic Panel: Recent Labs  Lab 11/21/20 1045 11/22/20 0444 11/23/20 0425 11/25/20 0103  NA 132* 130* 130* 132*  K 4.6 4.6 5.1 5.1  CL 94* 93* 91* 94*  CO2 28 25 23 26   GLUCOSE 112* 90 84 79  BUN 64* 76* 95* 76*  CREATININE 7.07* 8.08* 9.78* 8.66*  CALCIUM 9.0 9.0 8.9 9.2  PHOS 6.3* 5.4* 5.7*  --    Liver Function Tests: Recent Labs  Lab 11/21/20 1045 11/22/20 0444 11/23/20 0425  AST  --  18 17  ALT  --  6 <5  ALKPHOS  --  98 99  BILITOT  --  1.0 0.9  PROT  --  5.1* 4.9*  ALBUMIN 1.2* 1.2*   1.2* 1.2*  No results for input(s): LIPASE, AMYLASE in the last 168 hours. No results for input(s): AMMONIA in the last 168 hours. CBC: Recent Labs  Lab 11/21/20 0526 11/22/20 0444 11/23/20 0425 11/24/20 0605 11/25/20 0103  WBC 9.9 10.5 10.9* 11.7* 14.8*  HGB 8.1* 7.5* 7.1* 7.0* 6.7*  HCT 25.4* 23.3* 22.0* 22.6* 21.5*  MCV 90.4 91.7 91.7 92.2 90.7  PLT 388 433* 481* 520* 488*   Cardiac Enzymes: No results for input(s): CKTOTAL, CKMB, CKMBINDEX, TROPONINI in the last 168 hours. CBG: Recent Labs  Lab 11/24/20 2032 11/24/20 2341 11/25/20 0328 11/25/20 0424 11/25/20 0815  GLUCAP 88 73 69* 74 67*    Medications: Infusions:  sodium chloride 10 mL/hr (11/21/20 1722)   sodium chloride Stopped (10/28/20 0954)   sodium chloride     sodium chloride      ceFAZolin (ANCEF)  IV 2 g (11/24/20 1218)    Scheduled Medications:  heparin sodium (porcine)       sodium chloride   Intravenous Once   sodium chloride   Intravenous Once   amiodarone  200 mg Oral BID   atorvastatin  80 mg Oral q1800   chlorhexidine  15 mL Mouth Rinse BID   Chlorhexidine Gluconate Cloth  6 each Topical Q0600   Chlorhexidine Gluconate Cloth  6 each Topical Q0600   collagenase   Topical Daily   doxercalciferol  5 mcg Intravenous Q M,W,F   feeding supplement (NEPRO CARB STEADY)  237 mL Oral TID BM   guaiFENesin  600 mg Oral BID   heparin injection (subcutaneous)  5,000 Units Subcutaneous Q8H   lactobacillus  1 g Oral TID WC   mouth rinse  15 mL Mouth Rinse q12n4p   melatonin  5 mg Oral QHS   midodrine  20 mg Oral TID WC   multivitamin  1 tablet Oral QHS   nutrition supplement (JUVEN)  1 packet Oral BID BM   pantoprazole  40 mg Oral BID   Followed by   Derrill Memo ON 12/18/2020] pantoprazole  40 mg Oral Daily   polyethylene glycol  17 g Oral Daily   QUEtiapine  100 mg Oral QHS   sevelamer carbonate  1.6 g Oral TID WC   sodium chloride flush  10 mL Intracatheter Q8H   sodium chloride flush  10 mL Intracatheter Q8H   sodium chloride flush  10-40 mL Intracatheter Q12H   sodium chloride flush  3 mL Intravenous Q12H   sodium zirconium cyclosilicate  10 g Oral BID   sucralfate  1 g Oral QID   ticagrelor  90 mg Oral BID    have reviewed scheduled and prn medications.

## 2020-11-25 NOTE — Plan of Care (Signed)
  Problem: Education: Goal: Understanding of CV disease, CV risk reduction, and recovery process will improve Outcome: Progressing   Problem: Activity: Goal: Ability to return to baseline activity level will improve Outcome: Progressing   Problem: Cardiovascular: Goal: Ability to achieve and maintain adequate cardiovascular perfusion will improve Outcome: Progressing   Problem: Health Behavior/Discharge Planning: Goal: Ability to safely manage health-related needs after discharge will improve Outcome: Progressing   Problem: Education: Goal: Knowledge of General Education information will improve Description: Including pain rating scale, medication(s)/side effects and non-pharmacologic comfort measures Outcome: Progressing   Problem: Health Behavior/Discharge Planning: Goal: Ability to manage health-related needs will improve Outcome: Progressing   Problem: Clinical Measurements: Goal: Ability to maintain clinical measurements within normal limits will improve Outcome: Progressing Goal: Will remain free from infection Outcome: Progressing Goal: Diagnostic test results will improve Outcome: Progressing Goal: Respiratory complications will improve Outcome: Progressing Goal: Cardiovascular complication will be avoided Outcome: Progressing   Problem: Activity: Goal: Risk for activity intolerance will decrease Outcome: Progressing   Problem: Nutrition: Goal: Adequate nutrition will be maintained Outcome: Progressing   Problem: Coping: Goal: Level of anxiety will decrease Outcome: Progressing   Problem: Elimination: Goal: Will not experience complications related to bowel motility Outcome: Progressing   Problem: Pain Managment: Goal: General experience of comfort will improve Outcome: Progressing   Problem: Safety: Goal: Ability to remain free from injury will improve Outcome: Progressing   Problem: Skin Integrity: Goal: Risk for impaired skin integrity will  decrease Outcome: Progressing

## 2020-11-26 ENCOUNTER — Inpatient Hospital Stay (HOSPITAL_COMMUNITY): Payer: Medicare Other

## 2020-11-26 DIAGNOSIS — R012 Other cardiac sounds: Secondary | ICD-10-CM

## 2020-11-26 DIAGNOSIS — Z4682 Encounter for fitting and adjustment of non-vascular catheter: Secondary | ICD-10-CM | POA: Diagnosis not present

## 2020-11-26 DIAGNOSIS — J90 Pleural effusion, not elsewhere classified: Secondary | ICD-10-CM | POA: Diagnosis not present

## 2020-11-26 DIAGNOSIS — I712 Thoracic aortic aneurysm, without rupture: Secondary | ICD-10-CM | POA: Diagnosis not present

## 2020-11-26 DIAGNOSIS — I340 Nonrheumatic mitral (valve) insufficiency: Secondary | ICD-10-CM

## 2020-11-26 DIAGNOSIS — J9690 Respiratory failure, unspecified, unspecified whether with hypoxia or hypercapnia: Secondary | ICD-10-CM | POA: Diagnosis not present

## 2020-11-26 HISTORY — PX: TRANSTHORACIC ECHOCARDIOGRAM: SHX275

## 2020-11-26 LAB — ECHOCARDIOGRAM LIMITED
AR max vel: 2.42 cm2
AV Area VTI: 2.82 cm2
AV Area mean vel: 2.5 cm2
AV Mean grad: 12.7 mmHg
AV Peak grad: 22.8 mmHg
Ao pk vel: 2.39 m/s
Height: 71 in
S' Lateral: 4.6 cm
Single Plane A4C EF: 38.5 %
Weight: 2818.36 oz

## 2020-11-26 LAB — GLUCOSE, CAPILLARY
Glucose-Capillary: 138 mg/dL — ABNORMAL HIGH (ref 70–99)
Glucose-Capillary: 58 mg/dL — ABNORMAL LOW (ref 70–99)
Glucose-Capillary: 80 mg/dL (ref 70–99)
Glucose-Capillary: 83 mg/dL (ref 70–99)
Glucose-Capillary: 83 mg/dL (ref 70–99)
Glucose-Capillary: 88 mg/dL (ref 70–99)

## 2020-11-26 LAB — VITAMIN B12: Vitamin B-12: 1116 pg/mL — ABNORMAL HIGH (ref 180–914)

## 2020-11-26 LAB — BLOOD GAS, ARTERIAL
Acid-Base Excess: 2.5 mmol/L — ABNORMAL HIGH (ref 0.0–2.0)
Bicarbonate: 27.3 mmol/L (ref 20.0–28.0)
FIO2: 100
O2 Saturation: 89.1 %
Patient temperature: 37
pCO2 arterial: 47.9 mmHg (ref 32.0–48.0)
pH, Arterial: 7.374 (ref 7.350–7.450)
pO2, Arterial: 61.5 mmHg — ABNORMAL LOW (ref 83.0–108.0)

## 2020-11-26 LAB — POCT I-STAT 7, (LYTES, BLD GAS, ICA,H+H)
Acid-Base Excess: 8 mmol/L — ABNORMAL HIGH (ref 0.0–2.0)
Bicarbonate: 31.6 mmol/L — ABNORMAL HIGH (ref 20.0–28.0)
Calcium, Ion: 1.32 mmol/L (ref 1.15–1.40)
HCT: 28 % — ABNORMAL LOW (ref 39.0–52.0)
Hemoglobin: 9.5 g/dL — ABNORMAL LOW (ref 13.0–17.0)
O2 Saturation: 96 %
Patient temperature: 98.2
Potassium: 3.9 mmol/L (ref 3.5–5.1)
Sodium: 137 mmol/L (ref 135–145)
TCO2: 33 mmol/L — ABNORMAL HIGH (ref 22–32)
pCO2 arterial: 40.7 mmHg (ref 32.0–48.0)
pH, Arterial: 7.497 — ABNORMAL HIGH (ref 7.350–7.450)
pO2, Arterial: 77 mmHg — ABNORMAL LOW (ref 83.0–108.0)

## 2020-11-26 LAB — FERRITIN: Ferritin: 2752 ng/mL — ABNORMAL HIGH (ref 24–336)

## 2020-11-26 LAB — BASIC METABOLIC PANEL
Anion gap: 9 (ref 5–15)
BUN: 40 mg/dL — ABNORMAL HIGH (ref 6–20)
CO2: 30 mmol/L (ref 22–32)
Calcium: 9.1 mg/dL (ref 8.9–10.3)
Chloride: 96 mmol/L — ABNORMAL LOW (ref 98–111)
Creatinine, Ser: 6.1 mg/dL — ABNORMAL HIGH (ref 0.61–1.24)
GFR, Estimated: 10 mL/min — ABNORMAL LOW (ref >60)
Glucose, Bld: 82 mg/dL (ref 70–99)
Potassium: 4.2 mmol/L (ref 3.5–5.1)
Sodium: 135 mmol/L (ref 135–145)

## 2020-11-26 LAB — IRON AND TIBC
Iron: 30 ug/dL — ABNORMAL LOW (ref 45–182)
Saturation Ratios: 19 % (ref 17.9–39.5)
TIBC: 155 ug/dL — ABNORMAL LOW (ref 250–450)
UIBC: 125 ug/dL

## 2020-11-26 LAB — TROPONIN I (HIGH SENSITIVITY)
Troponin I (High Sensitivity): 287 ng/L (ref <18)
Troponin I (High Sensitivity): 750 ng/L (ref <18)

## 2020-11-26 LAB — RETICULOCYTES
Immature Retic Fract: 13.6 % (ref 2.3–15.9)
RBC.: 2.7 MIL/uL — ABNORMAL LOW (ref 4.22–5.81)
Retic Count, Absolute: 56.2 10*3/uL (ref 19.0–186.0)
Retic Ct Pct: 2.1 % (ref 0.4–3.1)

## 2020-11-26 LAB — MAGNESIUM
Magnesium: 2.2 mg/dL (ref 1.7–2.4)
Magnesium: 2 mg/dL (ref 1.7–2.4)

## 2020-11-26 LAB — PHOSPHORUS: Phosphorus: 4.7 mg/dL — ABNORMAL HIGH (ref 2.5–4.6)

## 2020-11-26 LAB — FOLATE: Folate: 14 ng/mL (ref >5.9)

## 2020-11-26 MED ORDER — HEPARIN SODIUM (PORCINE) 1000 UNIT/ML IJ SOLN
INTRAMUSCULAR | Status: AC
  Administered 2020-11-26: 09:00:00 2500 [IU] via INTRAVENOUS_CENTRAL
  Filled 2020-11-26: qty 3

## 2020-11-26 MED ORDER — ALBUMIN HUMAN 25 % IV SOLN
INTRAVENOUS | Status: AC
  Administered 2020-11-26: 11:00:00 25 g via INTRAVENOUS
  Filled 2020-11-26: qty 100

## 2020-11-26 MED ORDER — METOPROLOL TARTRATE 5 MG/5ML IV SOLN
5.0000 mg | Freq: Three times a day (TID) | INTRAVENOUS | Status: DC
  Administered 2020-11-26 – 2020-12-04 (×21): 5 mg via INTRAVENOUS
  Filled 2020-11-26 (×23): qty 5

## 2020-11-26 MED ORDER — VANCOMYCIN HCL 1750 MG/350ML IV SOLN
1750.0000 mg | Freq: Once | INTRAVENOUS | Status: DC
  Filled 2020-11-26: qty 350

## 2020-11-26 MED ORDER — ORAL CARE MOUTH RINSE
15.0000 mL | OROMUCOSAL | Status: DC
  Administered 2020-11-26 – 2020-12-02 (×51): 15 mL via OROMUCOSAL

## 2020-11-26 MED ORDER — PROPOFOL 1000 MG/100ML IV EMUL
INTRAVENOUS | Status: AC
  Administered 2020-11-26: 17:00:00 5 ug/kg/min via INTRAVENOUS
  Filled 2020-11-26: qty 100

## 2020-11-26 MED ORDER — FENTANYL CITRATE (PF) 100 MCG/2ML IJ SOLN
INTRAMUSCULAR | Status: AC
  Administered 2020-11-26: 17:00:00 100 ug
  Filled 2020-11-26: qty 2

## 2020-11-26 MED ORDER — MIDAZOLAM HCL 2 MG/2ML IJ SOLN
INTRAMUSCULAR | Status: AC
  Administered 2020-11-26: 17:00:00 4 mg
  Filled 2020-11-26: qty 2

## 2020-11-26 MED ORDER — MIDAZOLAM HCL 2 MG/2ML IJ SOLN
INTRAMUSCULAR | Status: AC
  Filled 2020-11-26: qty 2

## 2020-11-26 MED ORDER — PROPOFOL 1000 MG/100ML IV EMUL
0.0000 ug/kg/min | INTRAVENOUS | Status: DC
  Administered 2020-11-27 – 2020-11-28 (×2): 10 ug/kg/min via INTRAVENOUS
  Filled 2020-11-26 (×3): qty 100

## 2020-11-26 MED ORDER — FENTANYL CITRATE (PF) 100 MCG/2ML IJ SOLN
50.0000 ug | INTRAMUSCULAR | Status: DC | PRN
  Administered 2020-11-28: 50 ug via INTRAVENOUS
  Filled 2020-11-26 (×2): qty 2

## 2020-11-26 MED ORDER — ROCURONIUM BROMIDE 10 MG/ML (PF) SYRINGE
PREFILLED_SYRINGE | INTRAVENOUS | Status: AC
  Administered 2020-11-26: 17:00:00 100 mg
  Filled 2020-11-26: qty 10

## 2020-11-26 MED ORDER — LORAZEPAM 2 MG/ML IJ SOLN
1.0000 mg | Freq: Once | INTRAMUSCULAR | Status: AC
  Administered 2020-11-26: 08:00:00 1 mg via INTRAVENOUS
  Filled 2020-11-26: qty 1

## 2020-11-26 MED ORDER — METOPROLOL TARTRATE 25 MG PO TABS
25.0000 mg | ORAL_TABLET | Freq: Two times a day (BID) | ORAL | Status: DC
Start: 2020-11-26 — End: 2020-11-26

## 2020-11-26 MED ORDER — FENTANYL CITRATE (PF) 100 MCG/2ML IJ SOLN: 50.0000 ug | INTRAMUSCULAR | Status: DC | PRN

## 2020-11-26 MED ORDER — DOCUSATE SODIUM 50 MG/5ML PO LIQD
100.0000 mg | Freq: Two times a day (BID) | ORAL | Status: DC
  Administered 2020-11-26 – 2020-11-29 (×5): 100 mg
  Filled 2020-11-26 (×5): qty 10

## 2020-11-26 MED ORDER — LORAZEPAM 2 MG/ML IJ SOLN
1.0000 mg | Freq: Once | INTRAMUSCULAR | Status: AC
  Administered 2020-11-26: 14:00:00 1 mg via INTRAVENOUS
  Filled 2020-11-26: qty 1

## 2020-11-26 MED ORDER — ETOMIDATE 2 MG/ML IV SOLN
INTRAVENOUS | Status: AC
  Administered 2020-11-26: 17:00:00 20 mg
  Filled 2020-11-26: qty 10

## 2020-11-26 MED ORDER — HEPARIN SODIUM (PORCINE) 1000 UNIT/ML DIALYSIS: 2500.0000 [IU] | INTRAMUSCULAR | Status: DC | PRN

## 2020-11-26 MED ORDER — CHLORHEXIDINE GLUCONATE 0.12% ORAL RINSE (MEDLINE KIT)
15.0000 mL | Freq: Two times a day (BID) | OROMUCOSAL | Status: DC
  Administered 2020-11-26 – 2020-12-02 (×10): 15 mL via OROMUCOSAL

## 2020-11-26 MED ORDER — VANCOMYCIN HCL 750 MG/150ML IV SOLN
750.0000 mg | INTRAVENOUS | Status: DC
  Filled 2020-11-26: qty 150

## 2020-11-26 MED ORDER — DEXTROSE 50 % IV SOLN
12.5000 g | INTRAVENOUS | Status: AC
  Administered 2020-11-26: 21:00:00 12.5 g via INTRAVENOUS
  Filled 2020-11-26: qty 50

## 2020-11-26 MED ORDER — SODIUM CHLORIDE 0.9 % IV SOLN
2.0000 g | Freq: Once | INTRAVENOUS | Status: AC
  Administered 2020-11-27: 09:00:00 2 g via INTRAVENOUS

## 2020-11-26 MED ORDER — SODIUM CHLORIDE 0.9 % IV SOLN: 2.0000 g | INTRAVENOUS | Status: DC

## 2020-11-26 MED ORDER — VITAL HIGH PROTEIN PO LIQD: 1000.0000 mL | ORAL | Status: DC

## 2020-11-26 MED ORDER — PROSOURCE TF PO LIQD
45.0000 mL | Freq: Two times a day (BID) | ORAL | Status: DC
  Administered 2020-11-26 – 2020-11-27 (×2): 45 mL
  Filled 2020-11-26 (×2): qty 45

## 2020-11-26 MED ORDER — LORAZEPAM 2 MG/ML IJ SOLN
1.0000 mg | Freq: Four times a day (QID) | INTRAMUSCULAR | Status: DC | PRN
  Administered 2020-11-27 – 2020-12-09 (×6): 1 mg via INTRAVENOUS
  Filled 2020-11-26 (×7): qty 1

## 2020-11-26 MED ORDER — ALBUMIN HUMAN 25 % IV SOLN: 25.0000 g | Freq: Once | INTRAVENOUS | Status: AC

## 2020-11-26 NOTE — Progress Notes (Signed)
Had acute HD this am w/ 2.5 L off , had 1.5 L off yesterday.  Patient did not improve w/ HD this am making vol overload less likely cause of resp failure. Pt intubated now per CCM. Possible worsening pna and/or empyema as prior. Orders in for regular HD in ICU tomorrow.   Kelly Splinter, MD 11/26/2020, 5:30 PM

## 2020-11-26 NOTE — Progress Notes (Addendum)
eLink Physician-Brief Progress Note Patient Name: Travis [name] DOB: March 19, 1961 MRN: [id number]   Date of Service  11/26/2020  HPI/Events of Note  Request to review CXR obtained for increased SOB. CXR reveals persistent bilateral pleural effusions with persistent multifocal airspace opacities which appear to have progressed from an earlier CXR today.   eICU Interventions  Plan: 1. Increase PEEP to 14.      Intervention Category Major Interventions: Respiratory failure - evaluation and management;Hypoxemia - evaluation and management  [name] 11/26/2020, 8:55 PM

## 2020-11-26 NOTE — Progress Notes (Signed)
Desats to 80's resp- 34/min. uses accessory muscles. when BiPAP changed to NRM. Becomes agitated, wife at bedside. Rapid response and MD came to see pt at once. Stat ativan given. Sating 92 % on BiPAP. Continue to monitor.

## 2020-11-26 NOTE — Progress Notes (Signed)
Patient transported to CT and back without any complications.  

## 2020-11-26 NOTE — Progress Notes (Signed)
Remains on BiPAP 14/8, 100%  Still breathing in the 40s  Decision made to intubate

## 2020-11-26 NOTE — Progress Notes (Signed)
Went  to sleep after ativan dose. Bil. Wrist restraint not initiated. Continue to monitor.

## 2020-11-26 NOTE — Progress Notes (Signed)
[facility] NEPHROLOGY PROGRESS NOTE  Assessment/ Plan: Pt is a 59 y.o. yo male HTN, ESRD on HD admitted on 11/20 with fever, myalgia found to have NSTEMI, complicated by A. fib with RVR, bacteremia, GI bleed and VDRF.   Physical Exam: General: seen in room, dyspneic, on bipap this am Heart:RRR, s1s2 nl Lungs: Basal rhonchi, no wheezing  Abdomen:soft, Non-tender, non-distended, rectal tube in place Extremities:No edema Dialysis Access: TDC+  AV fistula no bruit   OP HD:AF MWF 4h 450/800 81.5kg 2/2.25 bath RUE AVF Hep 5000+ 1000 mid-run - hect 5 ug tiw  - mircera 50 ugIVq 4 weeks, last 11/15 (due 11/29)  Assessment/ Plan: 1. Acute SOB - suspect pulm edema w/ CXR appearance, acuity. Prob vol excess but cardiac issues also possibly contributing, cardiology is evaluating, new echo ordered.  Plan acute HD this am in the room. Bipap support underway.  2. Acute STEMI- S/P LAD PCI11/20, has otherdisease of LCx/RCA.Transitioned to brilinta. Per cardiology. 3. R parapneumonic effusion: Chest tube placed then dislodged, replaced again 12/14.  Per CCM. 4. MSSA cavitary PNA / bacteremia- sp vent support. Blood cx+. Getting 6 wks IV Ancef per pharm ending 12/30/20. F/U bcx's negative. [name] of AVF neg for abscess. 5. Hypotension - resolved 6. ESRD - usual HD MWF.  Required CRRT from 11/24-12/4.  Tolerating iHD. Unable to use heparin because of GI bleed/anemia. HD today urgently. HD tomorrow on schedule.  7. HD access - fistulogram done 12/13 by IR here, treated 2 outflow stenoses w/ good results. Duplex ordered for flow volume and eval A-V anastomosis per [facility] request. We had the temp cath dc'd yest 12/18 as it was in for over 3 weeks. Using AVF from here forward.  8. PEA arrest:Brief CPR. 9. Sacral decub - moderate size, getting local WC 10. Anemia ckd/ABLA -off of anticoagulation.  Received blood transfusion. Seen by GI, EGD 12/2 showed duodenal ulcers and  esophagitis, on PPI per NG tube.  11. Nutritoin - remains on NG tube feeds 12. Secondary hyperparathyroidism: ac sevelamer.  Monitor phosphorus level. 13. Afib:on amio , in NSR. S/p DCCV. 14. Severe MR:per cardiology.  Subjective: Seen in room, acute SOB this am, CXR looks wet.    Objective Vital signs in last 24 hours: Vitals:   11/26/20 0300 11/26/20 0459 11/26/20 0732 11/26/20 0800  BP: 113/72     Pulse: 90   92  Resp: 20 17  (!) 30  Temp: 98.2 F (36.8 C) 98 F (36.7 C) 98.7 F (37.1 C)   TempSrc: Oral Oral Oral   SpO2: 94%   100%  Weight:      Height:       Weight change: -5.9 kg  Intake/Output Summary (Last 24 hours) at 11/26/2020 0902 Last data filed at 11/25/2020 1221 Gross per 24 hour  Intake --  Output 1542 ml  Net -1542 ml       Labs: Basic Metabolic Panel: Recent Labs  Lab 11/21/20 1045 11/22/20 0444 11/23/20 0425 11/25/20 0103 11/26/20 0043  NA 132* 130* 130* 132* 135  K 4.6 4.6 5.1 5.1 4.2  CL 94* 93* 91* 94* 96*  CO2 28 25 23 26 30   GLUCOSE 112* 90 84 79 82  BUN 64* 76* 95* 76* 40*  CREATININE 7.07* 8.08* 9.78* 8.66* 6.10*  CALCIUM 9.0 9.0 8.9 9.2 9.1  PHOS 6.3* 5.4* 5.7*  --   --    Liver Function Tests: Recent Labs  Lab 11/21/20 1045 11/22/20 0444 11/23/20 0425  AST  --  18 17  ALT  --  6 <5  ALKPHOS  --  98 99  BILITOT  --  1.0 0.9  PROT  --  5.1* 4.9*  ALBUMIN 1.2* 1.2*  1.2* 1.2*   No results for input(s): LIPASE, AMYLASE in the last 168 hours. No results for input(s): AMMONIA in the last 168 hours. CBC: Recent Labs  Lab 11/21/20 0526 11/22/20 0444 11/23/20 0425 11/24/20 0605 11/25/20 0103 11/25/20 1725  WBC 9.9 10.5 10.9* 11.7* 14.8*  --   HGB 8.1* 7.5* 7.1* 7.0* 6.7* 7.8*  HCT 25.4* 23.3* 22.0* 22.6* 21.5* 24.6*  MCV 90.4 91.7 91.7 92.2 90.7  --   PLT 388 433* 481* 520* 488*  --    Cardiac Enzymes: No results for input(s): CKTOTAL, CKMB, CKMBINDEX, TROPONINI in the last 168 hours. CBG: Recent Labs  Lab  11/25/20 1550 11/25/20 2056 11/26/20 0032 11/26/20 0455 11/26/20 0756  GLUCAP 65* 87 80 83 88    Medications: Infusions: . sodium chloride 10 mL/hr (11/21/20 1722)  . sodium chloride Stopped (10/28/20 0954)  . albumin human    . albumin human    .  ceFAZolin (ANCEF) IV 2 g (11/24/20 1218)    Scheduled Medications: . sodium chloride   Intravenous Once  . sodium chloride   Intravenous Once  . amiodarone  200 mg Oral BID  . atorvastatin  80 mg Oral q1800  . chlorhexidine  15 mL Mouth Rinse BID  . Chlorhexidine Gluconate Cloth  6 each Topical Q0600  . Chlorhexidine Gluconate Cloth  6 each Topical Q0600  . [START ON 12/02/2020] darbepoetin (ARANESP) injection - DIALYSIS  60 mcg Intravenous Q Sat-HD  . doxercalciferol  5 mcg Intravenous Q M,W,F  . feeding supplement (NEPRO CARB STEADY)  237 mL Oral TID BM  . guaiFENesin  600 mg Oral BID  . heparin injection (subcutaneous)  5,000 Units Subcutaneous Q8H  . heparin sodium (porcine)      . lactobacillus  1 g Oral TID WC  . mouth rinse  15 mL Mouth Rinse q12n4p  . melatonin  5 mg Oral QHS  . metoprolol tartrate  25 mg Oral BID  . midodrine  20 mg Oral TID WC  . multivitamin  1 tablet Oral QHS  . nutrition supplement (JUVEN)  1 packet Oral BID BM  . pantoprazole  40 mg Oral BID   Followed by  . [START ON 12/18/2020] pantoprazole  40 mg Oral Daily  . polyethylene glycol  17 g Oral Daily  . QUEtiapine  100 mg Oral QHS  . sevelamer carbonate  1.6 g Oral TID WC  . sodium chloride flush  10 mL Intracatheter Q8H  . sodium chloride flush  10-40 mL Intracatheter Q12H  . sodium chloride flush  3 mL Intravenous Q12H  . sodium zirconium cyclosilicate  10 g Oral BID  . sucralfate  1 g Oral QID  . ticagrelor  90 mg Oral BID    have reviewed scheduled and prn medications.

## 2020-11-26 NOTE — Progress Notes (Signed)
   11/26/20 0800  BiPAP/CPAP/SIPAP  $ Non-Invasive Ventilator  Non-Invasive Vent Set Up;Non-Invasive Vent Initial  BiPAP/CPAP/SIPAP Pt Type Adult  Mask Type Full face mask  Mask Size Medium  Set Rate 8 breaths/min  Respiratory Rate 30 breaths/min  IPAP 12 cmH20  EPAP 6 cmH2O  Oxygen Percent 100 %  Minute Ventilation 17.4  Leak 46  Peak Inspiratory Pressure (PIP) 13  Tidal Volume (Vt) 589  BiPAP/CPAP/SIPAP BiPAP  Auto Titrate No  Press High Alarm 25 cmH2O  Press Low Alarm 5 cmH2O  CPAP/SIPAP surface wiped down Yes  BiPAP/CPAP /SiPAP Vitals  Pulse Rate 92  Resp (!) 30  SpO2 100 %  Bilateral Breath Sounds Rales  MEWS Score/Color  MEWS Score 2  MEWS Score Color Yellow  Pt placed on bipap per MD order post ABG results and rapid response.

## 2020-11-26 NOTE — Progress Notes (Signed)
Continues to do poorly  Breathing in the 40s and 50s  Intubated with a size 8 endotracheal tube, tolerated well   Will obtain a CT scan of the chest to further evaluate worsening infiltrative process/parapneumonic fluid collection/empyema

## 2020-11-26 NOTE — Progress Notes (Signed)
Transferred to [facility] .  by bed awake and alert. Belongings with spouse. Report given to [facility]

## 2020-11-26 NOTE — Progress Notes (Signed)
Desats on 70's on Non-rebreathing mask. Uses acessory muscles  on respiration NW_06  EE-033 systolic Lung- with scattered crackles. ambu bag for few min.  Awake and alert with period of confusion. MD and rapid response came to see pt at once. Abg done . Kept pulling out NRM. Ativan 1 mg iv given. Placed on bipap. sats- 90's Stat echo done. Stat hemodialysis on going.  Continue to monitor.

## 2020-11-26 NOTE — Progress Notes (Signed)
Time out performed. pt intubated and placed on vent. OG placed and secured. Pt is stable at this time.

## 2020-11-26 NOTE — Progress Notes (Signed)
Results of troponin  Texted to card PA. Awaiting return call.

## 2020-11-26 NOTE — Procedures (Signed)
Intubation Procedure Note  TYRIS ELIOT  [id number]  09/02/1961  Date:11/26/20  Time:5:16 PM   Provider Performing:Zyren Sevigny A Zaylyn Bergdoll    Procedure: Intubation (31500)  Indication(s) Respiratory Failure  Consent Risks of the procedure as well as the alternatives and risks of each were explained to the patient and/or caregiver.  Consent for the procedure was obtained and is signed in the bedside chart   Anesthesia Etomidate, Versed, Fentanyl and Rocuronium  20 of etomidate, 4 of Versed, 100 of fentanyl, 40 of rocuronium  Time Out Verified patient identification, verified procedure, site/side was marked, verified correct patient position, special equipment/implants available, medications/allergies/relevant history reviewed, required imaging and test results available.   Sterile Technique Usual hand hygeine, masks, and gloves were used   Procedure Description Patient positioned in bed supine.  Sedation given as noted above.  Patient was intubated with endotracheal tube using Glidescope.  View was Grade 1 full glottis .  Number of attempts was 1.  Colorimetric CO2 detector was consistent with tracheal placement.   Complications/Tolerance None; patient tolerated the procedure well. Chest X-ray is ordered to verify placement.   EBL None   Specimen(s) None

## 2020-11-26 NOTE — Progress Notes (Signed)
p   NAME:  Travis [name], MRN:  [id number], DOB:  05-07-1961, LOS: 60 ADMISSION DATE:  10/28/2020, CONSULTATION DATE:  10/29/20 REFERRING MD:  Cardiology - [name], CHIEF COMPLAINT:  Hypotension, gram positive cocci on culture, AMS  Brief History   Patient is a 59 y.o. male with history of ESRD, HTN, CAD-who presented with anterior STEMI-treated with DES to LAD-hospital course prolonged and complicated by atrial fibrillation with RVR requiring cardioversion x2,, septic shock due to MSSA bacteremia, brief PEA arrest requiring CPR, acute hypoxic respiratory failure requiring intubation, upper GI bleed with acute blood loss anemia due to severe esophagitis requiring PRBC transfusion, severe metabolic encephalopathy/ICU delirium, bilateral lung abscesses with empyema requiring chest tube insertion--stabilized in the ICU-subsequently transferred to the [facility] hospitalist service on 12/12, his chest tube was dislodged on the floor, he started developing increasing right-sided pleural effusion/empyema, patient was transferred back to ICU and chest tube was placed.   Past Medical History  HTN ESRD, HD MWF  Significant Hospital Events   Cardioversion 11/21 Cardiac cath stent to LAD 11/20  Consults:  PCCM  Nephrology ID  Procedures:  Central line 11/21 >> 11/29 Arterial line 11/21 out Repeat DCCV 11/23 11/23 ETT out HD line 11/24 RIJ >> Arterial line 11/24 out L Cochran CVC 11/30>>  Right-sided chest tube 12/14>>  Significant Diagnostic Tests:  11/24 TEE>>Left ventricular ejection fraction, by estimation, is 35 to 40%. The  left ventricle has moderately decreased function.Moderate to severe mitral regurgitation. 11/23 CT Chest>>Extensive multifocal nodular and patchy airspace disease in both lungs with a slight peripheral predominance in the upper lungs. 3.1 cm nodular consolidative opacity in the right upper lobe is cavitated. Imaging features likely related to multifocal pneumonia. Septic  emboli and metastatic disease considered less likely but not excluded. 11/22 LUE Vas Upper Extremity Doppler>> Arteriovenous fistula-Aneurysmal dilatation noted. 11/24 CT abdomen - no retroperitoneal hematoma 11/29 MR Brain>> No evidence acute intracranial abnormality, sinisitis, mastoid effusions 11/29>> MR Cervical Spine>> Given the provided history of bacteremia, facet joint septic arthritis is difficult to definitively exclude, but the lack of any surrounding marrow edema argues against this Cervical spondylosis  11/29 MR Thoracic Spine No significant marrow edema in thoracic spine , Mild thoracic spondylosis hypointense marrow signal throughout the thoracic spine, likely related to the patient's end-stage renal disease. multifocal airspace disease and cavitary pulmonary lesions. Small right pleural effusion. 11/29 MR Lumbar Spine:As noted above and Suspected cholecystolithiasis 12/11 CT chest - improving air space disease - R empyema persists but adequate chest tube position.  12/19-bilateral infiltration on the chest x-ray, possible collection right lung base Micro Data:  11/21 BCx2>>Staph aureus > MSSA by BCID 11/22 BCx 2 >> neg 11/23 trach asp >> staph aureus 11/24 BCx2>> neg 11/30 BC x 2>> staph epi>> MRSA 12/2/ BCx 2 >> neg 12/9 right pleural fluid >> negative  Antimicrobials:  Zosyn 11/21 x 1 Ancef 11/21 ->   Interim history/subjective:  Patient currently on BiPAP Saturating about 88 to 90% Tachypneic in the 30s Complaining of soreness in his sacral wound Wife at bedside  Objective   Blood pressure 128/74, pulse (!) 103, temperature 98.2 F (36.8 C), temperature source Axillary, resp. rate (!) 26, height 5\' 11"  (1.803 m), weight 79.9 kg, SpO2 99 %.        Intake/Output Summary (Last 24 hours) at 11/26/2020 1538 Last data filed at 11/26/2020 1222 Gross per 24 hour  Intake --  Output 2500 ml  Net -2500 ml   [employer]  11/24/20 0600 11/25/20 0650 11/25/20  0850  Weight: 86.5 kg 85.8 kg 79.9 kg   General:  Chronically ill appearing, increased work of breathing HEENT: Dry oral mucosa Neuro: AOx3, MAE CV: S1-S2 appreciated  PULM: Rhythm is labored GI: soft, NT, bs active  Extremities: warm/dry, no LE edema  Skin: no rashes   Resolved Hospital Problem list     Assessment & Plan:  Acute hypoxemic respiratory failure Chest x-ray worsening over the last couple of days despite fluid removal with dialysis-2.5 L removed yesterday 12/18 Significant increase in work of breathing and unable to maintain saturations on BiPAP with 100% FiO2 He was struggling with high flow oxygen -Continue BiPAP -Pulmonary hygiene -empirically start antibiotics-cefepime and vancomycin for possible healthcare associated pneumonia -According to spouse has been coughing up more with a change in the secretions days bringing up -Deep suction for Gram stain and cultures  Anterior ST elevation MI s/p DES 11/20 -On Brilinta -On statin -On amiodarone  Ischemic cardiomyopathy with an ejection fraction of 40 to 45% Atrial fibrillation Anticoagulation on hold for bleeding  Recent GI bleed status post EGD showing severe esophagitis and duodenal ulceration  MSSA bacteremia  parapneumonic effusion status post chest tube placement with DNase and TPA on 12/13  Unstageable sacral ulcer  ESRD on HD- MWF  Dysphagia -Was eating a couple of days back   Generalized deconditioning  Anemia of chronic illness Anemia of critical illness  Hypotension -On midodrine  Wound care following for hydrotherapy  If patient unable to tolerate BiPAP will need intubated Will need a CT scan of the chest when stable Follow cbc/cxr  I did ask patient and spouse about being on a ventilator Still been discussed Want to give him a chance off the ventilator if possible  Labs   CBC: Recent Labs  Lab 11/21/20 0526 11/22/20 0444 11/23/20 0425 11/24/20 0605 11/25/20 0103  11/25/20 1725  WBC 9.9 10.5 10.9* 11.7* 14.8*  --   HGB 8.1* 7.5* 7.1* 7.0* 6.7* 7.8*  HCT 25.4* 23.3* 22.0* 22.6* 21.5* 24.6*  MCV 90.4 91.7 91.7 92.2 90.7  --   PLT 388 433* 481* 520* 488*  --     Basic Metabolic Panel: Recent Labs  Lab 11/20/20 0500 11/20/20 2033 11/21/20 0526 11/21/20 1045 11/22/20 0444 11/23/20 0425 11/24/20 0605 11/25/20 0103 11/26/20 0043  NA 130* 128*  --  132* 130* 130*  --  132* 135  K 5.9* 5.6*  --  4.6 4.6 5.1  --  5.1 4.2  CL 90* 90*  --  94* 93* 91*  --  94* 96*  CO2 23 22  --  28 25 23   --  26 30  GLUCOSE 86 91  --  112* 90 84  --  79 82  BUN 121* 143*  --  64* 76* 95*  --  76* 40*  CREATININE 10.61* 11.45*  --  7.07* 8.08* 9.78*  --  8.66* 6.10*  CALCIUM 9.5 9.2  --  9.0 9.0 8.9  --  9.2 9.1  MG 2.0  --    < >  --  2.7* 2.8* 2.4 2.3 2.2  PHOS 7.1* 7.6*  --  6.3* 5.4* 5.7*  --   --   --    < > = values in this interval not displayed.   GFR: Estimated Creatinine Clearance: 14.1 mL/min (A) (by C-G formula based on SCr of 6.1 mg/dL (H)). Recent Labs  Lab 11/22/20 0444 11/23/20 0425 11/24/20 8676 11/25/20 0103  WBC 10.5 10.9* 11.7* 14.8*    Liver Function Tests: Recent Labs  Lab 11/20/20 0500 11/20/20 2033 11/21/20 1045 11/22/20 0444 11/23/20 0425  AST  --   --   --  18 17  ALT  --   --   --  6 <5  ALKPHOS  --   --   --  98 99  BILITOT  --   --   --  1.0 0.9  PROT  --   --   --  5.1* 4.9*  ALBUMIN 1.3* 1.3* 1.2* 1.2*   1.2* 1.2*   No results for input(s): LIPASE, AMYLASE in the last 168 hours. No results for input(s): AMMONIA in the last 168 hours.  ABG    Component Value Date/Time   PHART 7.374 11/26/2020 0748   PCO2ART 47.9 11/26/2020 0748   PO2ART 61.5 (L) 11/26/2020 0748   HCO3 27.3 11/26/2020 0748   TCO2 27 11/16/2020 1510   ACIDBASEDEF 0.3 11/16/2020 2200   O2SAT 89.1 11/26/2020 0748     Coagulation Profile: No results for input(s): INR, PROTIME in the last 168 hours.  Cardiac Enzymes: No results for  input(s): CKTOTAL, CKMB, CKMBINDEX, TROPONINI in the last 168 hours.  HbA1C: Hgb A1c MFr Bld  Date/Time Value Ref Range Status  10/28/2020 06:04 AM 4.3 (L) 4.8 - 5.6 % Final    Comment:    (NOTE) Pre diabetes:          5.7%-6.4%  Diabetes:              >6.4%  Glycemic control for   <7.0% adults with diabetes   01/30/2015 09:09 PM 5.1 4.8 - 5.6 % Final    Comment:    (NOTE)         Pre-diabetes: 5.7 - 6.4         Diabetes: >6.4         Glycemic control for adults with diabetes: <7.0     CBG: Recent Labs  Lab 11/25/20 2056 11/26/20 0032 11/26/20 0455 11/26/20 0756 11/26/20 1113  GLUCAP 87 80 83 88 83    The patient is critically ill with multiple organ systems failure and requires high complexity decision making for assessment and support, frequent evaluation and titration of therapies, application of advanced monitoring technologies and extensive interpretation of multiple databases. Critical Care Time devoted to patient care services described in this note independent of APP/resident time (if applicable)  is 35 minutes.   Sherrilyn Rist MD [facility] Personal pager: [phone] If unanswered, please page CCM On-call: [phone]

## 2020-11-26 NOTE — Progress Notes (Signed)
Cardiology Progress Note  Patient ID: SOU NOHR MRN: [id number] DOB: Oct 12, 1961 Date of Encounter: 11/26/2020  Primary Cardiologist: Glenetta Hew, MD  Subjective   Chief Complaint: Shortness of breath.  HPI: Overnight developed hypoxia respiratory failure.  Chest x-ray with pulmonary edema.  On BiPAP.  In A. fib with RVR.  ROS:  All other ROS reviewed and negative. Pertinent positives noted in the HPI.     Inpatient Medications  Scheduled Meds: . sodium chloride   Intravenous Once  . sodium chloride   Intravenous Once  . amiodarone  200 mg Oral BID  . atorvastatin  80 mg Oral q1800  . chlorhexidine  15 mL Mouth Rinse BID  . Chlorhexidine Gluconate Cloth  6 each Topical Q0600  . Chlorhexidine Gluconate Cloth  6 each Topical Q0600  . [START ON 12/02/2020] darbepoetin (ARANESP) injection - DIALYSIS  60 mcg Intravenous Q Sat-HD  . doxercalciferol  5 mcg Intravenous Q M,W,F  . feeding supplement (NEPRO CARB STEADY)  237 mL Oral TID BM  . guaiFENesin  600 mg Oral BID  . heparin injection (subcutaneous)  5,000 Units Subcutaneous Q8H  . heparin sodium (porcine)      . lactobacillus  1 g Oral TID WC  . mouth rinse  15 mL Mouth Rinse q12n4p  . melatonin  5 mg Oral QHS  . midodrine  20 mg Oral TID WC  . multivitamin  1 tablet Oral QHS  . nutrition supplement (JUVEN)  1 packet Oral BID BM  . pantoprazole  40 mg Oral BID   Followed by  . [START ON 12/18/2020] pantoprazole  40 mg Oral Daily  . polyethylene glycol  17 g Oral Daily  . QUEtiapine  100 mg Oral QHS  . sevelamer carbonate  1.6 g Oral TID WC  . sodium chloride flush  10 mL Intracatheter Q8H  . sodium chloride flush  10-40 mL Intracatheter Q12H  . sodium chloride flush  3 mL Intravenous Q12H  . sodium zirconium cyclosilicate  10 g Oral BID  . sucralfate  1 g Oral QID  . ticagrelor  90 mg Oral BID   Continuous Infusions: . sodium chloride 10 mL/hr (11/21/20 1722)  . sodium chloride Stopped (10/28/20 0954)  .  albumin human    . albumin human    .  ceFAZolin (ANCEF) IV 2 g (11/24/20 1218)   PRN Meds: sodium chloride, acetaminophen, clonazepam, heparin, ondansetron (ZOFRAN) IV, oxyCODONE, Resource ThickenUp Clear, senna, silver nitrate applicators, sodium chloride, sodium chloride flush, sodium chloride flush   Vital Signs   Vitals:   11/25/20 2347 11/26/20 0300 11/26/20 0459 11/26/20 0800  BP: 109/79 113/72    Pulse: 97 90  92  Resp: 19 20 17  (!) 30  Temp: 98.6 F (37 C) 98.2 F (36.8 C) 98 F (36.7 C)   TempSrc: Oral Oral Oral   SpO2: 94% 94%  100%  Weight:      Height:        Intake/Output Summary (Last 24 hours) at 11/26/2020 0836 Last data filed at 11/25/2020 1221 Gross per 24 hour  Intake --  Output 1542 ml  Net -1542 ml   Last 3 Weights 11/25/2020 11/25/2020 11/24/2020  Weight (lbs) 176 lb 2.4 oz 189 lb 2.5 oz 190 lb 11.2 oz  Weight (kg) 79.9 kg 85.8 kg 86.5 kg      Telemetry  Overnight telemetry shows atrial fibrillation heart rate in the 110 120 bpm range, which I personally reviewed.   Physical Exam  Vitals:   11/25/20 2347 11/26/20 0300 11/26/20 0459 11/26/20 0800  BP: 109/79 113/72    Pulse: 97 90  92  Resp: 19 20 17  (!) 30  Temp: 98.6 F (37 C) 98.2 F (36.8 C) 98 F (36.7 C)   TempSrc: Oral Oral Oral   SpO2: 94% 94%  100%  Weight:      Height:         Intake/Output Summary (Last 24 hours) at 11/26/2020 0836 Last data filed at 11/25/2020 1221 Gross per 24 hour  Intake --  Output 1542 ml  Net -1542 ml    Last 3 Weights 11/25/2020 11/25/2020 11/24/2020  Weight (lbs) 176 lb 2.4 oz 189 lb 2.5 oz 190 lb 11.2 oz  Weight (kg) 79.9 kg 85.8 kg 86.5 kg    Body mass index is 24.57 kg/m.   General: Ill-appearing, tachypnea noted, on BiPAP Head: Atraumatic, normal size  Eyes: PEERLA, EOMI  Neck: Supple, no JVD Endocrine: No thryomegaly Cardiac: Normal S1, S2; irregular rhythm, difficult to interpret due to coarse breath sounds and rales Lungs:  Diffuse rales bilaterally Abd: Soft, nontender, no hepatomegaly  Ext: No edema, pulses 2+ Musculoskeletal: No deformities, BUE and BLE strength normal and equal Skin: Warm and dry, no rashes   Neuro: Alert, awake, drowsy  Labs  High Sensitivity Troponin:   Recent Labs  Lab 10/28/20 0629 10/28/20 1046 10/28/20 2040 10/29/20 2121 10/30/20 0548  TROPONINIHS 3,155* >27,000* >27,000* >27,000* >27,000*     Cardiac EnzymesNo results for input(s): TROPONINI in the last 168 hours. No results for input(s): TROPIPOC in the last 168 hours.  Chemistry Recent Labs  Lab 11/21/20 1045 11/22/20 0444 11/23/20 0425 11/25/20 0103 11/26/20 0043  NA 132* 130* 130* 132* 135  K 4.6 4.6 5.1 5.1 4.2  CL 94* 93* 91* 94* 96*  CO2 28 25 23 26 30   GLUCOSE 112* 90 84 79 82  BUN 64* 76* 95* 76* 40*  CREATININE 7.07* 8.08* 9.78* 8.66* 6.10*  CALCIUM 9.0 9.0 8.9 9.2 9.1  PROT  --  5.1* 4.9*  --   --   ALBUMIN 1.2* 1.2*  1.2* 1.2*  --   --   AST  --  18 17  --   --   ALT  --  6 <5  --   --   ALKPHOS  --  98 99  --   --   BILITOT  --  1.0 0.9  --   --   GFRNONAA 8* 7* 6* 7* 10*  ANIONGAP 10 12 16* 12 9    Hematology Recent Labs  Lab 11/23/20 0425 11/24/20 0605 11/25/20 0103 11/25/20 1725 11/26/20 0043  WBC 10.9* 11.7* 14.8*  --   --   RBC 2.40* 2.45* 2.37*  --  2.70*  HGB 7.1* 7.0* 6.7* 7.8*  --   HCT 22.0* 22.6* 21.5* 24.6*  --   MCV 91.7 92.2 90.7  --   --   MCH 29.6 28.6 28.3  --   --   MCHC 32.3 31.0 31.2  --   --   RDW 14.6 14.6 14.2  --   --   PLT 481* 520* 488*  --   --    BNPNo results for input(s): BNP, PROBNP in the last 168 hours.  DDimer No results for input(s): DDIMER in the last 168 hours.   Radiology  DG CHEST PORT 1 VIEW  Result Date: 11/26/2020 CLINICAL DATA:  Hypoxia. EXAM: PORTABLE CHEST 1 VIEW COMPARISON:  11/24/2020 FINDINGS: Stable cardiomegaly. Worsening symmetric bilateral pulmonary airspace disease is seen, with central perihilar predominance, highly  suspicious for pulmonary edema. Small right pleural effusion shows is significant change. The right pleural pigtail catheter has been removed since prior study, however no pneumothorax is visualized. IMPRESSION: Worsening symmetric bilateral pulmonary airspace disease, highly suspicious for pulmonary edema. Stable small right pleural effusion and cardiomegaly. Electronically Signed   By: Marlaine Hind M.D.   On: 11/26/2020 08:22   DG Chest Port 1 View  Result Date: 11/24/2020 CLINICAL DATA:  Chest tube.  Follow-up pleural effusion. EXAM: PORTABLE CHEST 1 VIEW COMPARISON:  11/22/2020 FINDINGS: Pigtailed chest tube on the right has partially pulled out in there appear to be side holes outside of the chest. Small right pleural effusion and right lower lobe airspace disease unchanged. No pneumothorax Feeding tube enters the stomach with the tip not visualized. Right jugular central venous catheter tip in the right atrium unchanged. Left subclavian central venous catheter tip in the left innominate vein unchanged. Progressive left lower lobe consolidation without significant pleural effusion. Pulmonary vascular congestion appears to have progressed. IMPRESSION: Right basilar pigtail chest tube is partially pulled out of the chest. No pneumothorax Progressive bilateral airspace disease likely due to edema. Progressive left lower lobe consolidation. Electronically Signed   By: Franchot Gallo M.D.   On: 11/24/2020 09:00   VAS US DUPLEX DIALYSIS ACCESS (AVF, AVG)  Result Date: 11/24/2020 DIALYSIS ACCESS Reason for Exam: Follow up s/p ultrasound guided left upper extremity                  fistulagram and treatment of two tandem outflow stenoses. Access Site: Left Upper Extremity. Access Type: Basilic vein transposition. Limitations: sutures Comparison Study: 10/30/2020- AVF duplex Performing Technologist: Maudry Mayhew MHA, RDMS, RVT, RDCS  Examination Guidelines: A complete evaluation includes B-mode imaging,  spectral Doppler, color Doppler, and power Doppler as needed of all accessible portions of each vessel. Unilateral testing is considered an integral part of a complete examination. Limited examinations for reoccurring indications may be performed as noted.  Findings: +--------------------+----------+-----------------+--------+ AVF                 PSV (cm/s)Flow Vol (mL/min)Comments +--------------------+----------+-----------------+--------+ Native artery inflow   114          2190                +--------------------+----------+-----------------+--------+ AVF Anastomosis        352                              +--------------------+----------+-----------------+--------+  +------------+----------+-------------+----------+-----------------------------+ OUTFLOW VEINPSV (cm/s)Diameter (cm)Depth (cm)          Describe            +------------+----------+-------------+----------+-----------------------------+ Prox UA        287                            2.1cm diameter, thrombosed                                                  with 0.8cm residual lumen   +------------+----------+-------------+----------+-----------------------------+ Mid UA         110        2.36                                             +------------+----------+-------------+----------+-----------------------------+  Dist UA        572        2.46                                             +------------+----------+-------------+----------+-----------------------------+ AC Fossa       365                                                         +------------+----------+-------------+----------+-----------------------------+   Summary: 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 Arteriovenous fistula-Stenosis noted at the anastomosis. *See table(s) above for measurements and observations.  Diagnosing physician: Monica Martinez MD  Electronically signed by Monica Martinez MD on 11/24/2020 at 5:04:39 PM.    --------------------------------------------------------------------------------   Final     Cardiac Studies  TTE 11/13/2020 1. Left ventricular ejection fraction, by estimation, is 45 to 50%. The  left ventricle has mildly decreased function. The left ventricle  demonstrates regional wall motion abnormalities (see scoring  diagram/findings for description). There is severe  concentric left ventricular hypertrophy. Left ventricular diastolic  parameters are consistent with Grade I diastolic dysfunction (impaired  relaxation).  2. Right ventricular systolic function is normal. The right ventricular  size is normal. There is normal pulmonary artery systolic pressure. The  estimated right ventricular systolic pressure is 56.4 mmHg.  3. The mitral valve is degenerative. Mild mitral valve regurgitation. No  evidence of mitral stenosis.  4. The aortic valve is tricuspid. There is mild calcification of the  aortic valve. There is mild thickening of the aortic valve. Aortic valve  regurgitation is mild. Mild aortic valve sclerosis is present, with no  evidence of aortic valve stenosis.  5. The inferior vena cava is normal in size with greater than 50%  respiratory variability, suggesting right atrial pressure of 3 mmHg.   Patient Profile  Travis [name] is a 59 y.o. male with ESRD, hypertension, tobacco abuse, CAD who was admitted on 10/28/2020 for anterior wall STEMI.  He has residual RCA disease.  Course complicated by A. fib with RVR.  Course also complicated by MSSA bacteremia with shock, upper GI bleeding secondary to severe esophagitis/duodenal ulcer.  Course has also been complicated by bilateral lung abscesses and empyema requiring chest tubes.  He has been to the ICU twice.  Once for his anterior wall STEMI.  And then back for respiratory failure and chest tube insertion.  He was transferred out of the ICU  on 11/25/2020.  On 11/26/2020 he developed acute hypoxic respiratory failure that is ongoing.  Assessment & Plan   1.  Acute hypoxic respiratory failure -Happen acutely.  Did receive transfusion yesterday.  This was with dialysis.  Chest x-ray shows pulmonary edema.  ABG 7.3 7/47/61 consistent with profound hypoxia.  He is on BiPAP. -Renal plans for dialysis emergently. -We have ordered a stat echocardiogram to evaluate his mitral valve.  There has been mention of moderate to severe MR in the past.  I do not hear any murmurs on exam but due to respiratory status is difficult to auscultate his heart. -He reports no chest pain. -We will repeat an EKG as well.  2.  A. fib with  RVR -History of A. fib.  Had undergone cardioversion twice on admission.  Is on amiodarone.  No anticoagulation due to anemia and GI bleed. -Would allow for lenient rate control now.  His respiratory status driving his A. fib.  Okay to continue amiodarone.  Likely will convert back to rhythm once respiratory status improves.  Echo is pending. -Metoprolol tartrate 25 twice daily for A. fib rate control.  3.  Anterior wall STEMI/Ischemic cardiomyopathy EF 45 to 50% -PCI to mid LAD on 10/28/2020. -Residual RCA disease 80% proximal RCA and 99% circumflex -Plan was for staged PCI to RCA but due to GI bleed this is on hold. -Given recent stent placement we need to continue his Brilinta. -Still requiring transfusions but appears to be stable.  No further signs of bleeding. -Pulmonary edema.  Plans for dialysis.  Metoprolol tartrate for A. fib.  4.  Mitral regurgitation -Mild on most recent echo.  Repeat pending.  5.  Anemia -Requiring frequent transfusions.  Unfortunately have to continue Brilinta.  6.  MSSA bacteremia -On antibiotics  For questions or updates, please contact [place] Please consult [email or web address] for contact info under   CRITICAL CARE Performed by: Lake Bells T O'Neal  Total critical care time:  35 minutes. Critical care time was exclusive of separately billable procedures and treating other patients. Critical care was necessary to treat or prevent imminent or life-threatening deterioration. Critical care was time spent personally by me on the following activities: development of treatment plan with patient and/or surrogate as well as nursing, discussions with consultants, evaluation of patient's response to treatment, examination of patient, obtaining history from patient or surrogate, ordering and performing treatments and interventions, ordering and review of laboratory studies, ordering and review of radiographic studies, pulse oximetry and re-evaluation of patient's condition.   Signed, Addison Naegeli. Audie Box, [place]  11/26/2020 8:36 AM

## 2020-11-26 NOTE — Progress Notes (Addendum)
Progress Note    Travis [name]  JYN:[id number] DOB: 1961/07/15  DOA: 10/28/2020 PCP: London Pepper, MD    Brief Narrative:     Medical records reviewed and are as summarized below:  GREOGRY [name] is an 59 y.o. male with history of ESRD, HTN, CAD-who presented with anterior STEMI-treated with DES to LAD-hospital course prolonged and complicated by atrial fibrillation with RVR requiring cardioversion x2,, septic shock due to MSSA bacteremia, brief PEA arrest requiring CPR, acute hypoxic respiratory failure requiring intubation, upper GI bleed with acute blood loss anemia due to severe esophagitis requiring PRBC transfusion, severe metabolic encephalopathy/ICU delirium, bilateral lung abscesses with empyema requiring chest tube insertion--stabilized in the ICU-subsequently transferred to [facility] on 12/12, his chest tube was dislodged on the floor, he started developing increasing right-sided pleural effusion/empyema, patient was transferred back to ICU and chest tube was placed.  He was transferred back to [facility] on 12/18.  Slow to recover.  12/19 AM acute respiratory distress and needed extra HD without improvement in breathing and x ray.     Assessment/Plan:   Principal Problem:   Acute ST elevation myocardial infarction (STEMI) of anterolateral wall (HCC) Active Problems:   ESRD (end stage renal disease) (HCC)   UGIB (upper gastrointestinal bleed)   Essential hypertension   Hyperlipidemia with target LDL less than 70   3 V- CAD w/ ACS/STEMI: Culprit = 99% mLAD @ D1 (DES PCI jailing D1); 99% ost-AVGLCx, 80% calcified napkin ring prox RCA.    Presence of drug coated stent in LAD coronary artery: Resolute Onyx DES 2.75 mm x 18 mm (3.1 mm) at major D1 & SP1.   Acute combined systolic and diastolic heart failure (HCC)   Atrial fibrillation with RVR (HCC)   Bacteremia   Septic shock due to Staphylococcus aureus [facility])   Cardiac arrest ([place])   Pneumonia of  right lower lobe due to methicillin susceptible Staphylococcus aureus (MSSA) (HCC)   Severe mitral regurgitation   Acute myocardial infarction ([place])   Acute ST elevation myocardial infarction (STEMI) involving left anterior descending (LAD) coronary artery (HCC)   Pressure injury of skin   Protein-calorie malnutrition, severe   Pleural effusion   Empyema (HCC)   Chest tube in place    Acute respiratory failure with respiratory distress -x-ray with what appears to be pulm edema -s/p HD with 2.5L removed 12/19 -wean off Bipap if able- attempted to wean after HD but was not able to tolerate it - no fever but WBCs increasing  Acute STEMI with LAD stent -on Brilinta. -cardiology consult appreciated - Associated ischemic cardiomyopathy: EF 45-50%  paroxysmal atrial fibrillation  - anticoagulation held given GI bleeding during this hospitalization -last GI note from 12/13: [facility]: protonix PO  -amiodarone -cardiology consult appreciated  MSSA bacteremia - Planning for cefazolin for 6 weeks total  Right parapneumonic effusion, loculated but improved drainage after DNase/TPA -chest tube removed   End-stage renal disease.   -Dialysis as per nephrology - HD 12/18 and again at bedside on 12/19: total of 4L removed -? Lower dry weight  Pressure wound- sacral area, not present on admission Pressure Injury 11/12/20 Buttocks Bilateral;Medial Stage 4 - Full thickness tissue loss with exposed bone, tendon or muscle. (Active)  11/12/20 0800  Location: Buttocks  Location Orientation: Bilateral;Medial  Staging: Stage 4 - Full thickness tissue loss with exposed bone, tendon or muscle.  Wound Description (Comments):   Present on Admission:      Pressure  Injury 11/14/20 Buttocks Mid;Right;Left Unstageable - Full thickness tissue loss in which the base of the injury is covered by slough (yellow, tan, gray, green or brown) and/or eschar (tan, brown or black) in the wound bed. (Active)   11/14/20 1128  Location: Buttocks  Location Orientation: Mid;Right;Left  Staging: Unstageable - Full thickness tissue loss in which the base of the injury is covered by slough (yellow, tan, gray, green or brown) and/or eschar (tan, brown or black) in the wound bed.  Wound Description (Comments):   Present on Admission:   -bleeding from 12/18 stopped with pressure -s/p 1 unit PRBC 12/18- prior to HD  Anemia of CD and ABLA (GI bleed and bleeding from wound) -s/p 1 unit PRBC on 12/18 (prior to HD)  Severe malnutrition: Nutrition Status: Nutrition Problem: Severe Malnutrition Etiology: chronic illness Signs/Symptoms: severe muscle depletion,severe fat depletion Interventions: Ensure Enlive (each supplement provides 350kcal and 20 grams of protein),MVI,Tube feeding,Magic cup   I asked patient to be seen by Pulm due to worsening respiratory failure.  Patient to be moved back to the ICU for closer monitoring and further interventions   Family Communication/Anticipated D/C date and plan/Code Status   DVT prophylaxis: heparin Code Status: Full Code.  Disposition Plan: Status is: Inpatient  Remains inpatient appropriate because:Ongoing diagnostic testing needed not appropriate for outpatient work up   Dispo: The patient is from: Home              Anticipated d/c is to: tbd- CIR?              Anticipated d/c date is: > 3 days              Patient currently is not medically stable to d/c.         Medical Consultants:    ID  PCCM  Renal  Cards  IR  WOC     Subjective:   C/o difficulty breathing, very anxious   Objective:    Vitals:   11/26/20 1127 11/26/20 1130 11/26/20 1200 11/26/20 1222  BP:  104/70 120/76 128/74  Pulse: 97 87 93 (!) 103  Resp: (!) 23 (!) 23 (!) 25 (!) 26  Temp:    98.2 F (36.8 C)  TempSrc:    Axillary  SpO2: 96% 97% 98% 99%  Weight:      Height:        Intake/Output Summary (Last 24 hours) at 11/26/2020 1302 Last data filed  at 11/26/2020 1222 Gross per 24 hour  Intake --  Output 2500 ml  Net -2500 ml   Filed Weights   11/24/20 0600 11/25/20 0650 11/25/20 0850  Weight: 86.5 kg 85.8 kg 79.9 kg    Exam:   General: Appearance:    Ill appearing  male in respiratory distress     Lungs:     Rhonchus breath sounds b/l  Heart:    Tachycardic. irregular  MS:   All extremities are intact. No LE edema  Neurologic:   Awake, alert, confused, impulsive      Data Reviewed:   I have personally reviewed following labs and imaging studies:  Labs: Labs show the following:   Basic Metabolic Panel: Recent Labs  Lab 11/20/20 0500 11/20/20 2033 11/21/20 0526 11/21/20 1045 11/22/20 0444 11/23/20 0425 11/24/20 0605 11/25/20 0103 11/26/20 0043  NA 130* 128*  --  132* 130* 130*  --  132* 135  K 5.9* 5.6*  --  4.6 4.6 5.1  --  5.1 4.2  CL  90* 90*  --  94* 93* 91*  --  94* 96*  CO2 23 22  --  28 25 23   --  26 30  GLUCOSE 86 91  --  112* 90 84  --  79 82  BUN 121* 143*  --  64* 76* 95*  --  76* 40*  CREATININE 10.61* 11.45*  --  7.07* 8.08* 9.78*  --  8.66* 6.10*  CALCIUM 9.5 9.2  --  9.0 9.0 8.9  --  9.2 9.1  MG 2.0  --    < >  --  2.7* 2.8* 2.4 2.3 2.2  PHOS 7.1* 7.6*  --  6.3* 5.4* 5.7*  --   --   --    < > = values in this interval not displayed.   GFR Estimated Creatinine Clearance: 14.1 mL/min (A) (by C-G formula based on SCr of 6.1 mg/dL (H)). Liver Function Tests: Recent Labs  Lab 11/20/20 0500 11/20/20 2033 11/21/20 1045 11/22/20 0444 11/23/20 0425  AST  --   --   --  18 17  ALT  --   --   --  6 <5  ALKPHOS  --   --   --  98 99  BILITOT  --   --   --  1.0 0.9  PROT  --   --   --  5.1* 4.9*  ALBUMIN 1.3* 1.3* 1.2* 1.2*  1.2* 1.2*   No results for input(s): LIPASE, AMYLASE in the last 168 hours. No results for input(s): AMMONIA in the last 168 hours. Coagulation profile No results for input(s): INR, PROTIME in the last 168 hours.  CBC: Recent Labs  Lab 11/21/20 0526 11/22/20 0444  11/23/20 0425 11/24/20 0605 11/25/20 0103 11/25/20 1725  WBC 9.9 10.5 10.9* 11.7* 14.8*  --   HGB 8.1* 7.5* 7.1* 7.0* 6.7* 7.8*  HCT 25.4* 23.3* 22.0* 22.6* 21.5* 24.6*  MCV 90.4 91.7 91.7 92.2 90.7  --   PLT 388 433* 481* 520* 488*  --    Cardiac Enzymes: No results for input(s): CKTOTAL, CKMB, CKMBINDEX, TROPONINI in the last 168 hours. BNP (last 3 results) No results for input(s): PROBNP in the last 8760 hours. CBG: Recent Labs  Lab 11/25/20 2056 11/26/20 0032 11/26/20 0455 11/26/20 0756 11/26/20 1113  GLUCAP 87 80 83 88 83   D-Dimer: No results for input(s): DDIMER in the last 72 hours. Hgb A1c: No results for input(s): HGBA1C in the last 72 hours. Lipid Profile: No results for input(s): CHOL, HDL, LDLCALC, TRIG, CHOLHDL, LDLDIRECT in the last 72 hours. Thyroid function studies: No results for input(s): TSH, T4TOTAL, T3FREE, THYROIDAB in the last 72 hours.  Invalid input(s): FREET3 Anemia work up: Recent Labs    11/26/20 0043  VITAMINB12 1,116*  FOLATE 14.0  FERRITIN 2,752*  TIBC 155*  IRON 30*  RETICCTPCT 2.1   Sepsis Labs: Recent Labs  Lab 11/22/20 0444 11/23/20 0425 11/24/20 0605 11/25/20 0103  WBC 10.5 10.9* 11.7* 14.8*    Microbiology Recent Results (from the past 240 hour(s))  Body fluid culture     Status: None   Collection Time: 11/16/20  4:28 PM   Specimen: Pleura; Body Fluid  Result Value Ref Range Status   Specimen Description PLEURAL  Final   Special Requests Pleural R  Final   Gram Stain   Final    RARE WBC PRESENT, PREDOMINANTLY MONONUCLEAR NO ORGANISMS SEEN    Culture   Final    NO GROWTH 3 DAYS  Performed at [facility], [place] [address], [place], [place] [address]    Report Status 11/20/2020 FINAL  Final    Procedures and diagnostic studies:  DG CHEST PORT 1 VIEW  Result Date: 11/26/2020 CLINICAL DATA:  Hypoxia. EXAM: PORTABLE CHEST 1 VIEW COMPARISON:  11/24/2020 FINDINGS: Stable cardiomegaly. Worsening symmetric  bilateral pulmonary airspace disease is seen, with central perihilar predominance, highly suspicious for pulmonary edema. Small right pleural effusion shows is significant change. The right pleural pigtail catheter has been removed since prior study, however no pneumothorax is visualized. IMPRESSION: Worsening symmetric bilateral pulmonary airspace disease, highly suspicious for pulmonary edema. Stable small right pleural effusion and cardiomegaly. Electronically Signed   By: Marlaine Hind M.D.   On: 11/26/2020 08:22   VAS US DUPLEX DIALYSIS ACCESS (AVF, AVG)  Result Date: 11/24/2020 DIALYSIS ACCESS Reason for Exam: Follow up s/p ultrasound guided left upper extremity                  fistulagram and treatment of two tandem outflow stenoses. Access Site: Left Upper Extremity. Access Type: Basilic vein transposition. Limitations: sutures Comparison Study: 10/30/2020- AVF duplex Performing Technologist: Maudry Mayhew MHA, RDMS, RVT, RDCS  Examination Guidelines: A complete evaluation includes B-mode imaging, spectral Doppler, color Doppler, and power Doppler as needed of all accessible portions of each vessel. Unilateral testing is considered an integral part of a complete examination. Limited examinations for reoccurring indications may be performed as noted.  Findings: +--------------------+----------+-----------------+--------+ AVF                 PSV (cm/s)Flow Vol (mL/min)Comments +--------------------+----------+-----------------+--------+ Native artery inflow   114          2190                +--------------------+----------+-----------------+--------+ AVF Anastomosis        352                              +--------------------+----------+-----------------+--------+  +------------+----------+-------------+----------+-----------------------------+ OUTFLOW VEINPSV (cm/s)Diameter (cm)Depth (cm)          Describe             +------------+----------+-------------+----------+-----------------------------+ Prox UA        287                            2.1cm diameter, thrombosed                                                  with 0.8cm residual lumen   +------------+----------+-------------+----------+-----------------------------+ Mid UA         110        2.36                                             +------------+----------+-------------+----------+-----------------------------+ Dist UA        572        2.46                                             +------------+----------+-------------+----------+-----------------------------+  AC Fossa       365                                                         +------------+----------+-------------+----------+-----------------------------+   Summary: 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 Arteriovenous fistula-Stenosis noted at the anastomosis. *See table(s) above for measurements and observations.  Diagnosing physician: Monica Martinez MD Electronically signed by Monica Martinez MD on 11/24/2020 at 5:04:39 PM.    --------------------------------------------------------------------------------   Final    ECHOCARDIOGRAM LIMITED  Result Date: 11/26/2020    ECHOCARDIOGRAM LIMITED REPORT   Patient Name:   TAVEON ENYEART Date of Exam: 11/26/2020 Medical Rec #:  [id number]         Height:       71.0 in Accession #:    [id number]        Weight:       176.1 lb Date of Birth:  06/15/1961        BSA:          1.998 m Patient Age:    15 years          BP:           113/72 mmHg Patient Gender: M                 HR:           107 bpm. Exam Location:  Inpatient Procedure: 2D Echo STAT ECHO Indications:     abnormal heart sounds  History:         Patient has prior history of Echocardiogram examinations, most                  recent 11/13/2020. CAD, end stage renal disease., mitral                   regurgitation, Arrythmias:Atrial Fibrillation; Risk                  Factors:Dyslipidemia and Hypertension.  Sonographer:     Johny Chess Referring Phys:  [place] Diagnosing Phys: Eleonore Chiquito MD IMPRESSIONS  1. Left ventricular ejection fraction, by estimation, is 45 to 50%. The left ventricle has mildly decreased function. The left ventricle demonstrates regional wall motion abnormalities (see scoring diagram/findings for description). There is moderate concentric left ventricular hypertrophy. Indeterminate diastolic filling due to E-A fusion.  2. Right ventricular systolic function is normal. The right ventricular size is normal. Tricuspid regurgitation signal is inadequate for assessing PA pressure.  3. There is mild to moderate MR due to restricted PMVL due to mitral annular calcification (IIIB). MR is slightly worse than the prior study, but does not appear to be severe. The mitral valve is grossly normal. Mild to moderate mitral valve regurgitation. No evidence of mitral stenosis. Moderate mitral annular calcification.  4. The aortic valve is tricuspid. There is mild calcification of the aortic valve. There is mild thickening of the aortic valve. Aortic valve regurgitation is trivial. Mild to moderate aortic valve sclerosis/calcification is present, without any evidence of aortic stenosis.  5. The inferior vena cava is dilated in size with >50% respiratory variability, suggesting right atrial pressure of 8 mmHg. Comparison(s): No significant change from prior study. FINDINGS  Left Ventricle: Left ventricular ejection  fraction, by estimation, is 45 to 50%. The left ventricle has mildly decreased function. The left ventricle demonstrates regional wall motion abnormalities. The left ventricular internal cavity size was normal in size. There is moderate concentric left ventricular hypertrophy. Indeterminate diastolic filling due to E-A fusion.  LV Wall Scoring: The mid inferoseptal  segment, apical septal segment, and apex are hypokinetic. Right Ventricle: The right ventricular size is normal. No increase in right ventricular wall thickness. Right ventricular systolic function is normal. Tricuspid regurgitation signal is inadequate for assessing PA pressure. Mitral Valve: There is mild to moderate MR due to restricted PMVL due to mitral annular calcification (IIIB). MR is slightly worse than the prior study, but does not appear to be severe. The mitral valve is grossly normal. Moderate mitral annular calcification. Mild to moderate mitral valve regurgitation, with posteriorly-directed jet. No evidence of mitral valve stenosis. Tricuspid Valve: The tricuspid valve is grossly normal. Tricuspid valve regurgitation is trivial. No evidence of tricuspid stenosis. Aortic Valve: The aortic valve is tricuspid. There is mild calcification of the aortic valve. There is mild thickening of the aortic valve. Aortic valve regurgitation is trivial. Mild to moderate aortic valve sclerosis/calcification is present, without any evidence of aortic stenosis. Aortic valve mean gradient measures 12.7 mmHg. Aortic valve peak gradient measures 22.8 mmHg. Aortic valve area, by VTI measures 2.82 cm. Pulmonic Valve: The pulmonic valve was grossly normal. Pulmonic valve regurgitation is not visualized. No evidence of pulmonic stenosis. Aorta: The aortic root and ascending aorta are structurally normal, with no evidence of dilitation. Venous: The inferior vena cava is dilated in size with greater than 50% respiratory variability, suggesting right atrial pressure of 8 mmHg. Additional Comments: There is a small pleural effusion in the left lateral region. LEFT VENTRICLE PLAX 2D LVIDd:         5.80 cm LVIDs:         4.60 cm LV PW:         1.50 cm LV IVS:        1.30 cm LVOT diam:     2.20 cm LV SV:         109 LV SV Index:   55 LVOT Area:     3.80 cm  LV Volumes (MOD) LV vol d, MOD A4C: 169.0 ml LV vol s, MOD A4C: 104.0 ml  LV SV MOD A4C:     169.0 ml IVC IVC diam: 2.30 cm LEFT ATRIUM         Index LA diam:    5.50 cm 2.75 cm/m  AORTIC VALVE AV Area (Vmax):    2.42 cm AV Area (Vmean):   2.50 cm AV Area (VTI):     2.82 cm AV Vmax:           238.90 cm/s AV Vmean:          168.751 cm/s AV VTI:            0.388 m AV Peak Grad:      22.8 mmHg AV Mean Grad:      12.7 mmHg LVOT Vmax:         152.00 cm/s LVOT Vmean:        111.000 cm/s LVOT VTI:          0.288 m LVOT/AV VTI ratio: 0.74  AORTA Ao Root diam: 3.50 cm Ao Asc diam:  3.80 cm  SHUNTS Systemic VTI:  0.29 m Systemic Diam: 2.20 cm Eleonore Chiquito MD Electronically signed by Eleonore Chiquito MD Signature  Date/Time: 11/26/2020/12:08:55 PM    Final (Updated)     Medications:   . sodium chloride   Intravenous Once  . sodium chloride   Intravenous Once  . amiodarone  200 mg Oral BID  . atorvastatin  80 mg Oral q1800  . chlorhexidine  15 mL Mouth Rinse BID  . Chlorhexidine Gluconate Cloth  6 each Topical Q0600  . Chlorhexidine Gluconate Cloth  6 each Topical Q0600  . [START ON 12/02/2020] darbepoetin (ARANESP) injection - DIALYSIS  60 mcg Intravenous Q Sat-HD  . doxercalciferol  5 mcg Intravenous Q M,W,F  . feeding supplement (NEPRO CARB STEADY)  237 mL Oral TID BM  . guaiFENesin  600 mg Oral BID  . heparin injection (subcutaneous)  5,000 Units Subcutaneous Q8H  . lactobacillus  1 g Oral TID WC  . mouth rinse  15 mL Mouth Rinse q12n4p  . melatonin  5 mg Oral QHS  . metoprolol tartrate  25 mg Oral BID  . midodrine  20 mg Oral TID WC  . multivitamin  1 tablet Oral QHS  . nutrition supplement (JUVEN)  1 packet Oral BID BM  . pantoprazole  40 mg Oral BID   Followed by  . [START ON 12/18/2020] pantoprazole  40 mg Oral Daily  . polyethylene glycol  17 g Oral Daily  . QUEtiapine  100 mg Oral QHS  . sevelamer carbonate  1.6 g Oral TID WC  . sodium chloride flush  10 mL Intracatheter Q8H  . sodium chloride flush  10-40 mL Intracatheter Q12H  . sodium chloride flush  3 mL  Intravenous Q12H  . sodium zirconium cyclosilicate  10 g Oral BID  . sucralfate  1 g Oral QID  . ticagrelor  90 mg Oral BID   Continuous Infusions: . sodium chloride 10 mL/hr (11/21/20 1722)  . sodium chloride Stopped (10/28/20 0954)  .  ceFAZolin (ANCEF) IV 2 g (11/24/20 1218)     LOS: 29 days   Geradine Girt  [facility]   How to contact the [facility] Attending or Consulting provider [place] or covering provider during after hours [place], for this patient?  1. Check the care team in [facility] and look for a) attending/consulting [facility] provider listed and b) the [facility] team listed 2. Log into [email or web address] and use [place]'s universal password to access. If you do not have the password, please contact the hospital operator. 3. Locate the [facility] provider you are looking for under [facility] and page to a number that you can be directly reached. 4. If you still have difficulty reaching the provider, please page the [facility] (Director on Call) for the Hospitalists listed on amion for assistance.  11/26/2020, 1:02 PM

## 2020-11-26 NOTE — Progress Notes (Signed)
Transported pt from 2C02 via 100% BIPAP 14/8, RR 20 to 2H10, safe transport on full oxygen tank.  Sp02 93%.

## 2020-11-26 NOTE — Progress Notes (Signed)
  Echocardiogram 2D Echocardiogram has been performed.  Travis [name] 11/26/2020, 9:22 AM

## 2020-11-26 NOTE — Progress Notes (Signed)
Pharmacy Antibiotic Note  Travis [name] is a 59 y.o. male admitted on 10/28/2020 with ACS and ultimately MSSA bacteremia on 11/21 with plans to treat with cefazolin through 12/19/20. Pt is ESRD on HD MWF at home, now back on iHD after brief CRRT course. Pt had respiratory decompensation this afternoon so pharmacy has been consulted for vancomycin and cefepime dosing. Last iHD was this morning, next session planned for tomorrow 12/20.  Plan: -Vancomycin 1750mg  IV x1 then 750mg  IV qHD MWF - watch wt -Cefepime 2g IV x1 then qHD MWF -Stop cefazolin for now -Follow RRT plans, clinical course, S/Sx infection   Height: 5\' 11"  (180.3 cm) Weight: 79.9 kg (176 lb 2.4 oz) IBW/kg (Calculated) : 75.3  Temp (24hrs), Avg:98.3 F (36.8 C), Min:98 F (36.7 C), Max:98.7 F (37.1 C)  Recent Labs  Lab 11/21/20 0526 11/21/20 1045 11/22/20 0444 11/23/20 0425 11/24/20 0605 11/25/20 0103 11/26/20 0043  WBC 9.9  --  10.5 10.9* 11.7* 14.8*  --   CREATININE  --  7.07* 8.08* 9.78*  --  8.66* 6.10*    Estimated Creatinine Clearance: 14.1 mL/min (A) (by C-G formula based on SCr of 6.1 mg/dL (H)).    Allergies  Allergen Reactions  . Ibuprofen Hives  . Lisinopril Swelling    PT states he is allergic to all prils; caused facial swelling  . Naproxen Hives and Other (See Comments)    Alleve causes patient to have hives    Antimicrobials this admission: Zosyn 11/21>>11/21 Vancomycin 11/21>>11/21, 12/1 x1; 12/19 >> Cefepime 12/19 >> Cefazolin 11/22>> 12/19  Microbiology results: 11/21 Bcx: MSSA 11/22 Bcx: neg 11/23 Bcx: ngtd 11/23 TA: few GNR, rare GPC/GPR - MSSA 11/24 Bcx: ng 11/30 Bcx: 2/2 MRSE but drawn from same site 12/2 BCx: Neg 12/9 pleural fluid: ng  Thank you for allowing pharmacy to be a part of this patient's care.  Travis [name] 11/26/2020 3:55 PM

## 2020-11-27 DIAGNOSIS — I2102 ST elevation (STEMI) myocardial infarction involving left anterior descending coronary artery: Secondary | ICD-10-CM | POA: Diagnosis not present

## 2020-11-27 LAB — PHOSPHORUS
Phosphorus: 5.2 mg/dL — ABNORMAL HIGH (ref 2.5–4.6)
Phosphorus: 3 mg/dL (ref 2.5–4.6)

## 2020-11-27 LAB — CBC
HCT: 21.6 % — ABNORMAL LOW (ref 39.0–52.0)
Hemoglobin: 6.9 g/dL — CL (ref 13.0–17.0)
MCH: 29.4 pg (ref 26.0–34.0)
MCHC: 31.9 g/dL (ref 30.0–36.0)
MCV: 91.9 fL (ref 80.0–100.0)
Platelets: 446 10*3/uL — ABNORMAL HIGH (ref 150–400)
RBC: 2.35 MIL/uL — ABNORMAL LOW (ref 4.22–5.81)
RDW: 14.6 % (ref 11.5–15.5)
WBC: 17.4 10*3/uL — ABNORMAL HIGH (ref 4.0–10.5)
nRBC: 0 % (ref 0.0–0.2)

## 2020-11-27 LAB — GLUCOSE, CAPILLARY
Glucose-Capillary: 69 mg/dL — ABNORMAL LOW (ref 70–99)
Glucose-Capillary: 79 mg/dL (ref 70–99)
Glucose-Capillary: 79 mg/dL (ref 70–99)
Glucose-Capillary: 81 mg/dL (ref 70–99)
Glucose-Capillary: 91 mg/dL (ref 70–99)
Glucose-Capillary: 94 mg/dL (ref 70–99)

## 2020-11-27 LAB — BASIC METABOLIC PANEL
Anion gap: 10 (ref 5–15)
BUN: 29 mg/dL — ABNORMAL HIGH (ref 6–20)
CO2: 28 mmol/L (ref 22–32)
Calcium: 9.6 mg/dL (ref 8.9–10.3)
Chloride: 96 mmol/L — ABNORMAL LOW (ref 98–111)
Creatinine, Ser: 5.81 mg/dL — ABNORMAL HIGH (ref 0.61–1.24)
GFR, Estimated: 11 mL/min — ABNORMAL LOW (ref >60)
Glucose, Bld: 84 mg/dL (ref 70–99)
Potassium: 4 mmol/L (ref 3.5–5.1)
Sodium: 134 mmol/L — ABNORMAL LOW (ref 135–145)

## 2020-11-27 LAB — MAGNESIUM
Magnesium: 2.1 mg/dL (ref 1.7–2.4)
Magnesium: 1.8 mg/dL (ref 1.7–2.4)

## 2020-11-27 LAB — TRIGLYCERIDES: Triglycerides: 71 mg/dL (ref <150)

## 2020-11-27 LAB — PREPARE RBC (CROSSMATCH)

## 2020-11-27 MED ORDER — FLORANEX PO PACK
1.0000 g | PACK | Freq: Three times a day (TID) | ORAL | Status: DC
  Administered 2020-11-27 – 2020-11-29 (×6): 1 g
  Filled 2020-11-27 (×8): qty 1

## 2020-11-27 MED ORDER — VANCOMYCIN HCL 1750 MG/350ML IV SOLN
1750.0000 mg | Freq: Once | INTRAVENOUS | Status: DC
  Filled 2020-11-27: qty 350

## 2020-11-27 MED ORDER — MIDODRINE HCL 5 MG PO TABS
20.0000 mg | ORAL_TABLET | Freq: Three times a day (TID) | ORAL | Status: DC
  Administered 2020-11-28 – 2020-11-29 (×6): 20 mg
  Filled 2020-11-27 (×6): qty 4

## 2020-11-27 MED ORDER — PROSOURCE TF PO LIQD
90.0000 mL | Freq: Three times a day (TID) | ORAL | Status: DC
  Administered 2020-11-27 – 2020-11-29 (×6): 90 mL
  Filled 2020-11-27 (×6): qty 90

## 2020-11-27 MED ORDER — COLLAGENASE 250 UNIT/GM EX OINT
TOPICAL_OINTMENT | Freq: Every day | CUTANEOUS | Status: DC
  Administered 2020-12-03: 1 via TOPICAL
  Filled 2020-11-27 (×3): qty 30

## 2020-11-27 MED ORDER — POLYETHYLENE GLYCOL 3350 17 G PO PACK
17.0000 g | PACK | Freq: Every day | ORAL | Status: DC
  Administered 2020-11-28 – 2020-11-29 (×2): 17 g
  Filled 2020-11-27 (×2): qty 1

## 2020-11-27 MED ORDER — VITAL 1.5 CAL PO LIQD
1000.0000 mL | ORAL | Status: DC
  Administered 2020-11-28: 14:00:00 1000 mL

## 2020-11-27 MED ORDER — RENA-VITE PO TABS
1.0000 | ORAL_TABLET | Freq: Every day | ORAL | Status: DC
  Administered 2020-11-27 – 2020-11-29 (×3): 1
  Filled 2020-11-27 (×3): qty 1

## 2020-11-27 MED ORDER — DEXTROSE 10 % IV SOLN
INTRAVENOUS | Status: DC
  Administered 2020-11-28: 16:00:00 40 mL/h via INTRAVENOUS

## 2020-11-27 MED ORDER — MELATONIN 5 MG PO TABS
5.0000 mg | ORAL_TABLET | Freq: Every day | ORAL | Status: DC
  Administered 2020-11-27 – 2020-11-29 (×3): 5 mg
  Filled 2020-11-27 (×3): qty 1

## 2020-11-27 MED ORDER — ALBUMIN HUMAN 25 % IV SOLN
INTRAVENOUS | Status: AC
  Administered 2020-11-27: 15:00:00 25 g via INTRAVENOUS
  Filled 2020-11-27: qty 50

## 2020-11-27 MED ORDER — VANCOMYCIN HCL 750 MG/150ML IV SOLN: 750.0000 mg | INTRAVENOUS | Status: DC

## 2020-11-27 MED ORDER — ACETAMINOPHEN 325 MG PO TABS
650.0000 mg | ORAL_TABLET | ORAL | Status: DC | PRN
  Administered 2020-11-27 – 2020-11-28 (×2): 650 mg
  Filled 2020-11-27 (×2): qty 2

## 2020-11-27 MED ORDER — SUCRALFATE 1 GM/10ML PO SUSP
1.0000 g | Freq: Four times a day (QID) | ORAL | Status: DC
  Administered 2020-11-27 – 2020-11-29 (×11): 1 g
  Filled 2020-11-27 (×11): qty 10

## 2020-11-27 MED ORDER — ATORVASTATIN CALCIUM 80 MG PO TABS
80.0000 mg | ORAL_TABLET | Freq: Every day | ORAL | Status: DC
  Administered 2020-11-27 – 2020-11-29 (×3): 80 mg
  Filled 2020-11-27 (×3): qty 1

## 2020-11-27 MED ORDER — AMIODARONE HCL 200 MG PO TABS
200.0000 mg | ORAL_TABLET | Freq: Two times a day (BID) | ORAL | Status: DC
  Administered 2020-11-27 – 2020-11-29 (×6): 200 mg
  Filled 2020-11-27 (×6): qty 1

## 2020-11-27 MED ORDER — SODIUM CHLORIDE 0.9 % IV SOLN: 2.0000 g | INTRAVENOUS | Status: DC

## 2020-11-27 MED ORDER — SEVELAMER CARBONATE 0.8 G PO PACK
1.6000 g | PACK | Freq: Three times a day (TID) | ORAL | Status: DC
  Administered 2020-11-27 – 2020-11-29 (×5): 1.6 g
  Filled 2020-11-27 (×10): qty 2

## 2020-11-27 MED ORDER — DAKINS (1/4 STRENGTH) 0.125 % EX SOLN
Freq: Two times a day (BID) | CUTANEOUS | Status: AC
  Administered 2020-11-28: 1 via TOPICAL
  Filled 2020-11-27 (×2): qty 473

## 2020-11-27 MED ORDER — CEFAZOLIN SODIUM-DEXTROSE 2-4 GM/100ML-% IV SOLN
2.0000 g | INTRAVENOUS | Status: DC
  Administered 2020-11-27 – 2020-12-13 (×7): 2 g via INTRAVENOUS
  Filled 2020-11-27 (×14): qty 100

## 2020-11-27 MED ORDER — GUAIFENESIN 100 MG/5ML PO SOLN
15.0000 mL | ORAL | Status: DC
  Administered 2020-11-27 – 2020-11-30 (×17): 300 mg
  Filled 2020-11-27 (×3): qty 15
  Filled 2020-11-27: qty 25
  Filled 2020-11-27: qty 5
  Filled 2020-11-27 (×5): qty 15
  Filled 2020-11-27: qty 5
  Filled 2020-11-27: qty 15
  Filled 2020-11-27: qty 10
  Filled 2020-11-27: qty 25
  Filled 2020-11-27 (×4): qty 15

## 2020-11-27 MED ORDER — DOXERCALCIFEROL 4 MCG/2ML IV SOLN
INTRAVENOUS | Status: AC
  Administered 2020-11-27: 13:00:00 5 ug via INTRAVENOUS
  Filled 2020-11-27: qty 2

## 2020-11-27 MED ORDER — QUETIAPINE FUMARATE 100 MG PO TABS
100.0000 mg | ORAL_TABLET | Freq: Every day | ORAL | Status: DC
  Administered 2020-11-27 – 2020-11-29 (×3): 100 mg
  Filled 2020-11-27 (×3): qty 1

## 2020-11-27 MED ORDER — SODIUM ZIRCONIUM CYCLOSILICATE 10 G PO PACK
10.0000 g | PACK | Freq: Two times a day (BID) | ORAL | Status: DC
  Administered 2020-11-27 (×2): 10 g
  Filled 2020-11-27 (×3): qty 1

## 2020-11-27 MED ORDER — ALBUMIN HUMAN 25 % IV SOLN: 25.0000 g | Freq: Once | INTRAVENOUS | Status: AC

## 2020-11-27 MED ORDER — PANTOPRAZOLE SODIUM 40 MG PO PACK
40.0000 mg | PACK | Freq: Two times a day (BID) | ORAL | Status: DC
  Administered 2020-11-27 – 2020-11-29 (×6): 40 mg
  Filled 2020-11-27 (×6): qty 20

## 2020-11-27 MED ORDER — SODIUM CHLORIDE 0.9 % IV SOLN
2.0000 g | Freq: Once | INTRAVENOUS | Status: AC
  Filled 2020-11-27: qty 2

## 2020-11-27 MED ORDER — SODIUM CHLORIDE 0.9% IV SOLUTION: Freq: Once | INTRAVENOUS | Status: AC

## 2020-11-27 MED ORDER — TICAGRELOR 90 MG PO TABS
90.0000 mg | ORAL_TABLET | Freq: Two times a day (BID) | ORAL | Status: DC
  Administered 2020-11-27 – 2020-11-29 (×6): 90 mg
  Filled 2020-11-27 (×6): qty 1

## 2020-11-27 MED ORDER — SENNA 8.6 MG PO TABS: 1.0000 | ORAL_TABLET | Freq: Every day | ORAL | Status: DC | PRN

## 2020-11-27 MED ORDER — DEXTROSE 50 % IV SOLN
12.5000 g | INTRAVENOUS | Status: AC
  Administered 2020-11-27: 01:00:00 12.5 g via INTRAVENOUS
  Filled 2020-11-27: qty 50

## 2020-11-27 NOTE — Progress Notes (Addendum)
Occupational Therapy Treatment Patient Details Name: Travis [name] MRN: [id number] DOB: 04/20/1961 Today's Date: 11/27/2020    History of present illness Pt is 59 y.o. male with past medical history of end-stage renal disease on hemodialysis, essential hypertension, dilated ascending aorta and hyperlipidemia who presented on November 20 with anterior ST elevation myocardial infarction.  VDRF 11/23-12/3.  Stent placed 11/20.  Afib with RVR with cardioversion 11/21. Suspected cholecystolithiasis 11/29. Respiratory failure and reintubated 11/26/20.   OT comments  Pt with recent respiratory arrest and reintubation. Despite fatigue, pain, and intubation, pt is eager to participate in therapy. Pt requiring Mod A +2 for bed mobility and then presenting with limited sitting tolerance due to pain at buttocks. Pt performing sit<>stand x2 at EOB with Min A +2. RW, and elevated bed. Continue to recommend dc to CIR and will continue to follow acutely as admitted.   HR 100-120s. SPO2 >90 on vent with FiO2 at 50% and PEEP 14. RR elevating to 30s with effort and pain.   Follow Up Recommendations  CIR;Supervision/Assistance - 24 hour    Equipment Recommendations  3 in 1 bedside commode;Wheelchair (measurements OT);Wheelchair cushion (measurements OT)    Recommendations for Other Services PT consult    Precautions / Restrictions Precautions Precautions: Fall       Mobility Bed Mobility Overal bed mobility: Needs Assistance Bed Mobility: Rolling;Sidelying to Sit;Sit to Sidelying Rolling: Mod assist;+2 for physical assistance Sidelying to sit: Mod assist;+2 for physical assistance     Sit to sidelying: Mod assist;+2 for physical assistance General bed mobility comments: Mod A for rolling, bringing BLES over EOB, and then elevating trunk  Transfers Overall transfer level: Needs assistance Equipment used: Rolling walker (2 wheeled) Transfers: Sit to/from Stand Sit to Stand: Min assist;+2  physical assistance;From elevated surface         General transfer comment: Min A +2 to power up and gain balance. elevated bed for assistance into standing    Balance Overall balance assessment: Needs assistance Sitting-balance support: No upper extremity supported;Feet supported Sitting balance-Leahy Scale: Fair   Postural control:  (sitting EoB, requires maximal assist for balance to keep off sacral wound) Standing balance support: Bilateral upper extremity supported Standing balance-Leahy Scale: Poor Standing balance comment: Requiring use of UE on RW and +2 min                           ADL either performed or assessed with clinical judgement   ADL Overall ADL's : Needs assistance/impaired                         Toilet Transfer: Minimal assistance;+2 for safety/equipment;+2 for physical assistance (simulated in room) Toilet Transfer Details (indicate cue type and reason): Min A +2 for sit<>stand for elevated surface         Functional mobility during ADLs: Minimal assistance;+2 for physical assistance;+2 for safety/equipment (sit<>stand) General ADL Comments: Pt very eager to participate in therapy. However, limited by pain at sacral wound. Agreeable to stand at EOB for reducing pressure and weight bearing at bottom     Vision       Perception     Praxis      Cognition Arousal/Alertness: Awake/alert Behavior During Therapy: [facility] for tasks assessed/performed Overall Cognitive Status: Difficult to assess  General Comments: Difficult to assess as pt is re-intubated. Pt eager to use communication board to verbalize thoughts. Feel pt is close to baseline cognition.        Exercises     Shoulder Instructions       General Comments HR 100-120s. SPO2 >90 on vent with FiO2 at 50% and PEEP 14. RR elevating to 30s with effort and pain.    Pertinent Vitals/ Pain       Pain Assessment: Faces Faces  Pain Scale: Hurts little more Pain Location: bottom Pain Descriptors / Indicators: Discomfort;Sore Pain Intervention(s): Monitored during session;Limited activity within patient's tolerance;Repositioned  Home Living                                          Prior Functioning/Environment              Frequency  Min 2X/week        Progress Toward Goals  OT Goals(current goals can now be found in the care plan section)  Progress towards OT goals: Not progressing toward goals - comment (Recent respiratory arrest)  Acute Rehab OT Goals Patient Stated Goal: Move better and get stronger OT Goal Formulation: With patient Time For Goal Achievement: 11/28/20 Potential to Achieve Goals: Good ADL Goals Pt Will Perform Grooming: with set-up;with supervision;sitting (supportive sitting) Pt Will Perform Upper Body Bathing: with min assist;sitting Pt Will Perform Lower Body Bathing: with mod assist;sit to/from stand;sitting/lateral leans Pt Will Transfer to Toilet: with mod assist;with +2 assist;stand pivot transfer;bedside commode Additional ADL Goal #1: Pt will perform bed mobility with Min A in preparation for ADLs Additional ADL Goal #2: Pt will tolerate sitting at EOB for ~10 minutes with Min A in preparation for ADLs  Plan Discharge plan remains appropriate    Co-evaluation    PT/OT/SLP Co-Evaluation/Treatment: Yes Reason for Co-Treatment: Complexity of the patient's impairments (multi-system involvement);For patient/therapist safety;To address functional/ADL transfers   OT goals addressed during session: ADL's and self-care      AM-PAC OT "6 Clicks" Daily Activity     Outcome Measure   Help from another person eating meals?: A Little Help from another person taking care of personal grooming?: A Little Help from another person toileting, which includes using toliet, bedpan, or urinal?: A Lot Help from another person bathing (including washing, rinsing,  drying)?: A Lot Help from another person to put on and taking off regular upper body clothing?: A Lot Help from another person to put on and taking off regular lower body clothing?: Total 6 Click Score: 13    End of Session Equipment Utilized During Treatment: Rolling walker;Oxygen  OT Visit Diagnosis: Unsteadiness on feet (R26.81);Other abnormalities of gait and mobility (R26.89);Muscle weakness (generalized) (M62.81);Pain Pain - Right/Left:  (sacral area) Pain - part of body:  (Bottom)   Activity Tolerance Patient tolerated treatment well   Patient Left with call bell/phone within reach;with family/visitor present;in bed;with nursing/sitter in room   Nurse Communication Mobility status        Time: 4627-0350 OT Time Calculation (min): 23 min  Charges: OT General Charges $OT Visit: 1 Visit OT Treatments $Therapeutic Activity: 8-22 mins  [place], OTR/L Acute Rehab Pager: [phone] Office: [place] 11/27/2020, 1:00 PM

## 2020-11-27 NOTE — Progress Notes (Signed)
eLink Physician-Brief Progress Note Patient Name: Travis [name] DOB: 09-08-1961 MRN: [id number]   Date of Service  11/27/2020  HPI/Events of Note  Hypoglycemic episodes X 2 - 59 and 70. Already given D50.  eICU Interventions  Plan: 1. D10W to run IV at 40 mL/hour.     Intervention Category Major Interventions: Other:  [name] 11/27/2020, 12:34 AM

## 2020-11-27 NOTE — Progress Notes (Signed)
SLP Cancellation Note  Patient Details Name: Travis [name] MRN: [id number] DOB: 08/31/1961   Cancelled treatment:       Reason Eval/Treat Not Completed: Medical issues which prohibited therapy (pt intubated). Will continue to follow along.   Osie Bond., M.A. [facility] Pager [phone] Office [phone]  11/27/2020, 11:34 AM

## 2020-11-27 NOTE — Progress Notes (Signed)
PCCM progress note  Notified by RN of critical lab value of hemoglobin 6.9.  I reassessed CBC this a.m. as hemoglobin had been downtrending.  Likely secondary to acute illness in combination with end-stage renal disease.  Will transfuse 1 unit packed red blood cells now.  Hopefully this can be provided during dialysis treatment.  Johnsie Cancel, NP-C [facility] Contact / Pager information can be found on Amion  11/27/2020, 12:21 PM

## 2020-11-27 NOTE — Progress Notes (Addendum)
NAME:  Travis [name], MRN:  [id number], DOB:  06/03/1961, LOS: 56 ADMISSION DATE:  10/28/2020, CONSULTATION DATE:  10/29/20 REFERRING MD:  Cardiology - [name], CHIEF COMPLAINT:  Hypotension, gram positive cocci on culture, AMS  Brief History   Patient is a 59 y.o. male with history of ESRD, HTN, CAD-who presented with anterior STEMI-treated with DES to LAD-hospital course prolonged and complicated by atrial fibrillation with RVR requiring cardioversion x2,, septic shock due to MSSA bacteremia, brief PEA arrest requiring CPR, acute hypoxic respiratory failure requiring intubation, upper GI bleed with acute blood loss anemia due to severe esophagitis requiring PRBC transfusion, severe metabolic encephalopathy/ICU delirium, bilateral lung abscesses with empyema requiring chest tube insertion  Stabilized in the ICU-subsequently transferred to the [facility] hospitalist service on 12/12, his chest tube was dislodged on the floor, he started developing increasing right-sided pleural effusion/empyema, patient was transferred back to ICU and chest tube was placed.   Past Medical History  HTN ESRD, HD MWF  Significant Hospital Events   Cardioversion 11/21 Cardiac cath stent to LAD 11/20  Consults:  PCCM  Nephrology ID  Procedures:  Central line 11/21 >> 11/29 Arterial line 11/21 out Repeat DCCV 11/23 11/23 ETT out HD line 11/24 RIJ >> Arterial line 11/24 out L [place] CVC 11/30>>  Right-sided chest tube 12/14>>  Significant Diagnostic Tests:   11/24 TEE>>Left ventricular ejection fraction, by estimation, is 35 to 40%. The  left ventricle has moderately decreased function.Moderate to severe mitral regurgitation.  11/23 CT Chest>>Extensive multifocal nodular and patchy airspace disease in both lungs with a slight peripheral predominance in the upper lungs. 3.1 cm nodular consolidative opacity in the right upper lobe is cavitated. Imaging features likely related to multifocal pneumonia. Septic  emboli and metastatic disease considered less likely but not excluded.  11/22 LUE Vas Upper Extremity Doppler>> Arteriovenous fistula-Aneurysmal dilatation noted.  11/24 CT abdomen - no retroperitoneal hematoma  11/29 MR Brain>> No evidence acute intracranial abnormality, sinisitis, mastoid effusions  11/29>> MR Cervical Spine>> Given the provided history of bacteremia, facet joint septic arthritis is difficult to definitively exclude, but the lack of any surrounding marrow edema argues against this Cervical spondylosis   11/29 MR Thoracic Spine No significant marrow edema in thoracic spine , Mild thoracic spondylosis hypointense marrow signal throughout the thoracic spine, likely related to the patient's end-stage renal disease. multifocal airspace disease and cavitary pulmonary lesions. Small right pleural effusion.  11/29 MR Lumbar Spine:As noted above and Suspected cholecystolithiasis  12/11 CT chest  improving air space disease - R empyema persists but adequate chest tube position.   12/19 CXR -bilateral infiltration on the chest x-ray, possible collection right lung base Micro Data:  11/21 BCx2>>Staph aureus > MSSA by BCID 11/22 BCx 2 >> neg 11/23 trach asp >> staph aureus 11/24 BCx2>> neg 11/30 BC x 2>> staph epi>> MRSA 12/2/ BCx 2 >> neg 12/9 right pleural fluid >> negative  Antimicrobials:  Zosyn 11/21 x 1 Ancef 11/21 ->   Interim history/subjective:  Seen bed on mechanical vent completely alert and oriented Utilizing written communication No acute events overnight Bedside and updated  Objective   Blood pressure 119/77, pulse (!) 102, temperature 98.5 F (36.9 C), temperature source Oral, resp. rate 20, height 5\' 11"  (1.803 m), weight 79.9 kg, SpO2 100 %.    Vent Mode: PRVC FiO2 (%):  [50 %-100 %] 50 % Set Rate:  [20 bmp] 20 bmp Vt Set:  [600 mL] 600 mL PEEP:  [10 cmH20-14 cmH20] 14  cmH20 Plateau Pressure:  [23 cmH20-32 cmH20] 23 cmH20   Intake/Output Summary  (Last 24 hours) at 11/27/2020 0930 Last data filed at 11/27/2020 0700 Gross per 24 hour  Intake 346.61 ml  Output 2500 ml  Net -2153.39 ml   Filed Weights   11/24/20 0600 11/25/20 0650 11/25/20 0850  Weight: 86.5 kg 85.8 kg 79.9 kg   Physical exam  General: Mildly deconditioned adult male lying in bed on mechanical ventilation in no acute distress HEENT: ETT, MM pink/moist, PERRL,  Neuro: Alert and oriented x3 able to utilize written communication on vent CV: s1s2 regular rate and rhythm, no murmur, rubs, or gallops,  PULM: Faint lower lobe crackles, tolerating ventilator well, oxygen saturations 100% on 50% FiO2, PEEP currently 14 GI: soft, bowel sounds active in all 4 quadrants, non-tender, non-distended, tolerating TF Extremities: warm/dry, no edema  Skin: no rashes or lesions  Resolved Hospital Problem list     Assessment & Plan:  Acute hypoxemic respiratory failure -Chest x-ray continue to worsen despite ability for volume removal over the last 2 sessions however on further history from wife patient had missed several rounds of dialysis prior to transfer back to ICU due to "being bumped" patient also was unable to undergo volume removal on several occasions due to hypotension  -CTA chest shows bilateral groundglass opacities consistent with likely pulmonary edema  P: Continue ventilator support with lung protective strategies  Mentating beautifully on ventilator, if able to pass SBT can likely be extubated Would like patient to receive another round of hemodialysis prior to extubation Wean PEEP and FiO2 for sats greater than 90%. Head of bed elevated 30 degrees. Plateau pressures less than 30 cm H20.  Follow intermittent chest x-ray and ABG.   SAT/SBT as tolerated, mentation preclude extubation  Ensure adequate pulmonary hygiene  Follow cultures  VAP bundle in place  PAD protocol Respiratory culture negative so far may be able to further de-escalate antibiotics  ESRD  on HD- MWF -Suspicion is high that respiratory decompensation was related to volume overload P: Nephrology following, appreciate assistance Increase volume removal per nephrology through CRRT Follow renal function / urine output Trend Bmet Avoid nephrotoxins  Ensure adequate renal perfusion   Anterior ST elevation MI s/p DES 11/20 Ischemic cardiomyopathy with an ejection fraction of 40 to 45% Paroxysmal atrial fibrillation -Anticoagulation on hold due to recent GI bleed Mitral regurgitation P: Seen by cardiology during admission Continue Brilinta, statin, and amiodarone Currently not candidate for anticoagulation given recent GI bleed Continuous telemetry Currently in sinus rhythm  MSSA bacteremia -Seen with multidrug-resistant Staph epidermidis P: Seen by infectious disease appreciate assistance Recommendations made for 6 weeks of cefazolin date 12/20/2019 Antibiotics switched to IV cefepime Monday Wednesday Friday 12/20 Follow repeat cultures  Parapneumonic effusion status post chest tube placement with DNase and TPA on 12/13 P: Routine chest tube care Place chest tube per protocol Monitor tube output  Unstageable sacral ulcer P: WOC consulted Continue hydrotherapy Pressure alleviating devices Every 2 hours turns  Generalized deconditioning P: PT/OT efforts when able  Anemia of chronic illness Anemia of critical illness Recent GI bleed  -status post EGD showing severe esophagitis and duodenal ulceration P: Trend CBC Transfuse per protocol Hemoglobin goal greater than 8  Labs   CBC: Recent Labs  Lab 11/21/20 0526 11/22/20 0444 11/23/20 0425 11/24/20 0605 11/25/20 0103 11/25/20 1725 11/26/20 1827  WBC 9.9 10.5 10.9* 11.7* 14.8*  --   --   HGB 8.1* 7.5* 7.1* 7.0* 6.7* 7.8* 9.5*  HCT 25.4* 23.3* 22.0* 22.6* 21.5* 24.6* 28.0*  MCV 90.4 91.7 91.7 92.2 90.7  --   --   PLT 388 433* 481* 520* 488*  --   --     Basic Metabolic Panel: Recent Labs  Lab  11/21/20 1045 11/22/20 0444 11/23/20 0425 11/24/20 0605 11/25/20 0103 11/26/20 0043 11/26/20 1750 11/26/20 1827 11/27/20 0518  NA 132* 130* 130*  --  132* 135  --  137 134*  K 4.6 4.6 5.1  --  5.1 4.2  --  3.9 4.0  CL 94* 93* 91*  --  94* 96*  --   --  96*  CO2 28 25 23   --  26 30  --   --  28  GLUCOSE 112* 90 84  --  79 82  --   --  84  BUN 64* 76* 95*  --  76* 40*  --   --  29*  CREATININE 7.07* 8.08* 9.78*  --  8.66* 6.10*  --   --  5.81*  CALCIUM 9.0 9.0 8.9  --  9.2 9.1  --   --  9.6  MG  --  2.7* 2.8* 2.4 2.3 2.2 2.0  --  2.1  PHOS 6.3* 5.4* 5.7*  --   --   --  4.7*  --  5.2*   GFR: Estimated Creatinine Clearance: 14.8 mL/min (A) (by C-G formula based on SCr of 5.81 mg/dL (H)). Recent Labs  Lab 11/22/20 0444 11/23/20 0425 11/24/20 0605 11/25/20 0103  WBC 10.5 10.9* 11.7* 14.8*    Liver Function Tests: Recent Labs  Lab 11/20/20 2033 11/21/20 1045 11/22/20 0444 11/23/20 0425  AST  --   --  18 17  ALT  --   --  6 <5  ALKPHOS  --   --  98 99  BILITOT  --   --  1.0 0.9  PROT  --   --  5.1* 4.9*  ALBUMIN 1.3* 1.2* 1.2*  1.2* 1.2*   No results for input(s): LIPASE, AMYLASE in the last 168 hours. No results for input(s): AMMONIA in the last 168 hours.  ABG    Component Value Date/Time   PHART 7.497 (H) 11/26/2020 1827   PCO2ART 40.7 11/26/2020 1827   PO2ART 77 (L) 11/26/2020 1827   HCO3 31.6 (H) 11/26/2020 1827   TCO2 33 (H) 11/26/2020 1827   ACIDBASEDEF 0.3 11/16/2020 2200   O2SAT 96.0 11/26/2020 1827     Coagulation Profile: No results for input(s): INR, PROTIME in the last 168 hours.  Cardiac Enzymes: No results for input(s): CKTOTAL, CKMB, CKMBINDEX, TROPONINI in the last 168 hours.  HbA1C: Hgb A1c MFr Bld  Date/Time Value Ref Range Status  10/28/2020 06:04 AM 4.3 (L) 4.8 - 5.6 % Final    Comment:    (NOTE) Pre diabetes:          5.7%-6.4%  Diabetes:              >6.4%  Glycemic control for   <7.0% adults with diabetes   01/30/2015  09:09 PM 5.1 4.8 - 5.6 % Final    Comment:    (NOTE)         Pre-diabetes: 5.7 - 6.4         Diabetes: >6.4         Glycemic control for adults with diabetes: <7.0     CBG: Recent Labs  Lab 11/26/20 2011 11/26/20 2053 11/27/20 0021 11/27/20 0435 11/27/20 0817  GLUCAP 58*  138* 69* 79 [place] Performed by: Johnsie Cancel  Total critical care time: 40 minutes  Critical care time was exclusive of separately billable procedures and treating other patients.  Critical care was necessary to treat or prevent imminent or life-threatening deterioration.  Critical care was time spent personally by me on the following activities: development of treatment plan with patient and/or surrogate as well as nursing, discussions with consultants, evaluation of patient's response to treatment, examination of patient, obtaining history from patient or surrogate, ordering and performing treatments and interventions, ordering and review of laboratory studies, ordering and review of radiographic studies, pulse oximetry and re-evaluation of patient's condition.  Johnsie Cancel, NP-C [facility] Contact / Pager information can be found on Amion  11/27/2020, 9:55 AM

## 2020-11-27 NOTE — Progress Notes (Signed)
Inpatient Rehabilitation Admissions Coordinator  Noted patient is intubated. We will follow at a distance.  Danne Baxter, RN, MSN Rehab Admissions Coordinator [phone] 11/27/2020 11:37 AM

## 2020-11-27 NOTE — Progress Notes (Signed)
[facility] Progress Note   Assessment/ Plan:   OP HD:AF MWF 4h 450/800 81.5kg 2/2.25 bath RUE AVF Hep5000+1019md-run - hect 5 ug tiw  - mircera 50 ugIVq 4 weeks, last 11/15 (due 11/29)  Assessment/ Plan: 1. Acute hypoxic RF: had HD Sunday without improvement and was intubated--> appears to have frothy pink secretions in ETT.  HD today with increased UF goal --> on max midodrine, will also try albumin to help support BP 2. Acute STEMI- S/P LAD PCI11/20, has otherdisease of LCx/RCA.Transitioned to brilinta. Per cardiology. 3. R parapneumonic effusion: Chest tube placed then dislodged, replaced again 12/14.  Per CCM. 4. MSSA cavitary PNA / bacteremia- sp vent support. Blood cx+. Getting 6 wks IV Ancef per pharm ending 12/30/20. F/U bcx's negative. [place] neg for abscess. 5. Hypotension - on max dose midodrine (20 TID) 6. ESRD - usual HD MWF.  Required CRRT from 11/24-12/4.  Tolerating iHD. Unable to use heparin because of GI bleed/anemia. HD today on schedule, had extra HD 12/19.  7. HD access - fistulogram done 12/13 by IR here, treated 2 outflow stenoses w/ good results. Duplex ordered for flow volume and eval A-V anastomosis per [facility] request. We had the temp cath dc'd 12/18 as it was in for over 3 weeks. Using AVF from here forward.  8. PEA arrest:Brief CPR. 9. Sacral decub - moderate size, getting local WC 10. Anemia ckd/ABLA -off of anticoagulation.  Received blood transfusion. Seen by GI, EGD 12/2 showed duodenal ulcers and esophagitis, on PPI per NG tube.  11. Nutrition - TF on hold 12. Secondary hyperparathyroidism: ac sevelamer.  Monitor phosphorus level. 13. Afib:on amio , in NSR. S/p DCCV. 14. Severe MR:per cardiology.   Subjective:    Seen in room.  Intubated, communicative, responsive.  Frothy pink secretions in ETT and vent circuit.     Objective:   BP 119/77    Pulse (!) 102    Temp 98.5 F (36.9 C) (Oral)    Resp 20    Ht 5'  11" (1.803 m)    Wt 79.9 kg    SpO2 100%    BMI 24.57 kg/m   Physical Exam: Gen: lying in bed, awake and alert CVS: RRR Resp: bilateral coarse crackles heard throughout Abd: soft Ext: 1+ pedal edema ACCESS: AVF  Labs: BMET Recent Labs  Lab 11/20/20 2033 11/21/20 1045 11/22/20 0444 11/23/20 0425 11/25/20 0103 11/26/20 0043 11/26/20 1750 11/26/20 1827 11/27/20 0518  NA 128* 132* 130* 130* 132* 135  --  137 134*  K 5.6* 4.6 4.6 5.1 5.1 4.2  --  3.9 4.0  CL 90* 94* 93* 91* 94* 96*  --   --  96*  CO2 22 28 25 23 26 30   --   --  28  GLUCOSE 91 112* 90 84 79 82  --   --  84  BUN 143* 64* 76* 95* 76* 40*  --   --  29*  CREATININE 11.45* 7.07* 8.08* 9.78* 8.66* 6.10*  --   --  5.81*  CALCIUM 9.2 9.0 9.0 8.9 9.2 9.1  --   --  9.6  PHOS 7.6* 6.3* 5.4* 5.7*  --   --  4.7*  --  5.2*   CBC Recent Labs  Lab 11/22/20 0444 11/23/20 0425 11/24/20 0605 11/25/20 0103 11/25/20 1725 11/26/20 1827  WBC 10.5 10.9* 11.7* 14.8*  --   --   HGB 7.5* 7.1* 7.0* 6.7* 7.8* 9.5*  HCT 23.3* 22.0* 22.6*  21.5* 24.6* 28.0*  MCV 91.7 91.7 92.2 90.7  --   --   PLT 433* 481* 520* 488*  --   --       Medications:     sodium chloride   Intravenous Once   sodium chloride   Intravenous Once   amiodarone  200 mg Per Tube BID   atorvastatin  80 mg Per Tube q1800   chlorhexidine  15 mL Mouth Rinse BID   chlorhexidine gluconate (MEDLINE KIT)  15 mL Mouth Rinse BID   Chlorhexidine Gluconate Cloth  6 each Topical Q0600   Chlorhexidine Gluconate Cloth  6 each Topical Q0600   [START ON 12/02/2020] darbepoetin (ARANESP) injection - DIALYSIS  60 mcg Intravenous Q Sat-HD   docusate  100 mg Per Tube BID   doxercalciferol  5 mcg Intravenous Q M,W,F   feeding supplement (PROSource TF)  45 mL Per Tube BID   feeding supplement (VITAL HIGH PROTEIN)  1,000 mL Per Tube Q24H   guaiFENesin  15 mL Per Tube Q4H   heparin injection (subcutaneous)  5,000 Units Subcutaneous Q8H   lactobacillus  1 g Per  Tube TID WC   mouth rinse  15 mL Mouth Rinse q12n4p   mouth rinse  15 mL Mouth Rinse 10 times per day   melatonin  5 mg Per Tube QHS   metoprolol tartrate  5 mg Intravenous Q8H   midodrine  20 mg Per Tube TID WC   multivitamin  1 tablet Per Tube QHS   pantoprazole sodium  40 mg Per Tube BID   polyethylene glycol  17 g Per Tube Daily   QUEtiapine  100 mg Per Tube QHS   sevelamer carbonate  1.6 g Per Tube TID WC   sodium chloride flush  10 mL Intracatheter Q8H   sodium chloride flush  10-40 mL Intracatheter Q12H   sodium chloride flush  3 mL Intravenous Q12H   sodium zirconium cyclosilicate  10 g Per Tube BID   sucralfate  1 g Per Tube QID   ticagrelor  90 mg Per Tube BID     Madelon Lips, MD 11/27/2020, 10:41 AM

## 2020-11-27 NOTE — Progress Notes (Signed)
   11/27/20 1540  Vitals  Temp 97.7 F (36.5 C)  Temp Source Axillary  BP 104/65  MAP (mmHg) 77  BP Location Right Arm  BP Method Automatic  Patient Position (if appropriate) Lying  Pulse Rate 86  ECG Heart Rate 86  Resp 16  Oxygen Therapy  SpO2 100 %  O2 Device Ventilator  Post-Hemodialysis Assessment  Rinseback Volume (mL) 250 mL  KECN 293 V  Dialyzer Clearance Lightly streaked  Duration of HD Treatment -hour(s) 3 hour(s)  Hemodialysis Intake (mL) 900 mL  UF Total -Machine (mL) 3200 mL  Net UF (mL) 2300 mL  Tolerated HD Treatment Yes  Post-Hemodialysis Comments tx completed-pt stable  AVG/AVF Arterial Site Held (minutes) 5 minutes  AVG/AVF Venous Site Held (minutes) 5 minutes  Fistula / Graft Left Upper arm Arteriovenous fistula  Placement Date/Time: 07/26/14 0941   Placed prior to admission: No  Orientation: Left  Access Location: (c) Upper arm  Access Type: Arteriovenous fistula  Site Condition No complications  Fistula / Graft Assessment Present;Bruit  Status Deaccessed  HD tx complete, PRBC 1 unit transfused, IV/ABT given, intermittent asymptomatic hypotension, with SBP dropping below 90. Albumin 25g given x1, with minimal effects. UF=2.3 liters net, pt unable to tolerate fluids added to goal.

## 2020-11-27 NOTE — Progress Notes (Signed)
Attending note: I have seen and examined the patient. History, labs and imaging reviewed.  Interval History:  12/20: awake and following commands. On dialysis at time of my exam. BP marginal but almost complete. Hoping to sbt and extubate if pt tolerates after treatment.     Labs reviewed, significant for wbc 17.4 Hgb 6.9  Imaging: bilateral edema, personally reviewed by me.  Assessment/plan: Acute hypoxic resp failure:  -sbt -titrate vent -hoping will be able to extubate after dialysis today.   ESRD MWF: -cont per nephrology  mssa bacteremia:  -6 weeks of abx   Ant ST elevation MI s/p DES  ICM PAF -holding a/c 2/2 recent GI bleed  The patient is critically ill with multiple organ systems failure and requires high complexity decision making for assessment and support, frequent evaluation and titration of therapies, application of advanced monitoring technologies and extensive interpretation of multiple databases.  Critical care time 38 mins. This represents my time independent of the NP's/PA's/med student/residents time taking care of the pt. This is excluding procedures   [facility] 11/27/2020, 3:25 PM

## 2020-11-27 NOTE — Progress Notes (Signed)
Physical Therapy Wound Treatment Patient Details  Name: Travis [name] MRN: [id number] Date of Birth: Dec 01, 1961  Today's Date: 11/27/2020 Time: 1215-1256 Time Calculation (min): 41 min  Subjective  Subjective: alert on vent utilizing sign language he just learned with OT Patient and Family Stated Goals: None stated Prior Treatments: Dressing changes  Pain Score: Pt was premedicated. He appeared painful at times but tolerated well with breaks.   Wound Assessment  Pressure Injury 11/14/20 Buttocks Mid;Right;Left Unstageable - Full thickness tissue loss in which the base of the injury is covered by slough (yellow, tan, gray, green or brown) and/or eschar (tan, brown or black) in the wound bed. (Active)  Dressing Type ABD;Barrier Film (skin prep);Gauze (Comment);Moist to dry 11/27/20 1402  Dressing Changed;Clean;Dry;Intact 11/27/20 1402  Dressing Change Frequency Daily 11/27/20 1402  State of Healing Eschar 11/27/20 1402  Site / Wound Assessment Yellow;Brown;Red 11/27/20 1402  % Wound base Red or Granulating 35% 11/27/20 1402  % Wound base Yellow/Fibrinous Exudate 55% 11/27/20 1402  % Wound base Black/Eschar 10% 11/27/20 1402  % Wound base Other/Granulation Tissue (Comment) 0% 11/27/20 1402  Peri-wound Assessment Intact 11/27/20 1402  Wound Length (cm) 11 cm 11/21/20 1200  Wound Width (cm) 17 cm 11/21/20 1200  Wound Depth (cm) 2 cm 11/21/20 1200  Wound Surface Area (cm^2) 187 cm^2 11/21/20 1200  Wound Volume (cm^3) 374 cm^3 11/21/20 1200  Tunneling (cm) 0 11/22/20 1401  Undermining (cm) 0 11/22/20 1401  Margins Unattached edges (unapproximated) 11/27/20 1402  Drainage Amount Minimal 11/27/20 1402  Drainage Description Serosanguineous 11/27/20 1402  Treatment Debridement (Selective);Hydrotherapy (Pulse lavage);Packing (Saline gauze) 11/27/20 1402   Santyl applied to wound bed prior to applying dressing.     Hydrotherapy Pulsed lavage therapy - wound location: Buttocks Pulsed  Lavage with Suction (psi): 4 psi (4-8 due to pain) Pulsed Lavage with Suction - Normal Saline Used: 1000 mL Pulsed Lavage Tip: Tip with splash shield Selective Debridement Selective Debridement - Location: Buttocks Selective Debridement - Tools Used: Forceps;Scalpel Selective Debridement - Tissue Removed: brown and yellow unviable tissue   Wound Assessment and Plan  Wound Therapy - Assess/Plan/Recommendations Wound Therapy - Clinical Statement: Resumed debridement this session with minimal bleeding, well controlled by end of session. Judeen Hammans, [place] RN present to assess wound with therapist. We will begin Dakin's solution for 3 days starting tomorrow - Santyl applied today. This patient will benefit from continued hydrotherapy for selective removal of unviable tissue, to decrease bioburden and promote Wound Therapy - Functional Problem List: Global weakness Factors Delaying/Impairing Wound Healing: Immobility;Multiple medical problems Hydrotherapy Plan: Debridement;Dressing change;Patient/family education;Pulsatile lavage with suction Wound Therapy - Frequency: 6X / week Wound Therapy - Follow Up Recommendations: Skilled nursing facility Wound Plan: See above  Wound Therapy Goals- Improve the function of patient's integumentary system by progressing the wound(s) through the phases of wound healing (inflammation - proliferation - remodeling) by: Decrease Necrotic Tissue to: 20% Decrease Necrotic Tissue - Progress: Progressing toward goal Increase Granulation Tissue to: 80% Increase Granulation Tissue - Progress: Progressing toward goal Goals/treatment plan/discharge plan were made with and agreed upon by patient/family: Yes Time For Goal Achievement: 7 days Wound Therapy - Potential for Goals: Good  Goals will be updated until maximal potential achieved or discharge criteria met.  Discharge criteria: when goals achieved, discharge from hospital, MD decision/surgical intervention, no progress  towards goals, refusal/missing three consecutive treatments without notification or medical reason.  GP     Thelma Comp 11/27/2020, 2:16 PM  Rolinda Roan, PT, DPT  Acute Rehabilitation Services Pager: [phone] Office: [phone]

## 2020-11-27 NOTE — Progress Notes (Signed)
Progress Note  Patient Name: TOSHIRO HANKEN Date of Encounter: 11/27/2020  [facility] Cardiologist: Glenetta Hew, MD   Subjective   Patient alert and intubated.  Denies complaints.  Inpatient Medications    Scheduled Meds: . sodium chloride   Intravenous Once  . sodium chloride   Intravenous Once  . amiodarone  200 mg Per Tube BID  . atorvastatin  80 mg Per Tube q1800  . chlorhexidine  15 mL Mouth Rinse BID  . chlorhexidine gluconate (MEDLINE KIT)  15 mL Mouth Rinse BID  . Chlorhexidine Gluconate Cloth  6 each Topical Q0600  . Chlorhexidine Gluconate Cloth  6 each Topical Q0600  . [START ON 12/02/2020] darbepoetin (ARANESP) injection - DIALYSIS  60 mcg Intravenous Q Sat-HD  . docusate  100 mg Per Tube BID  . doxercalciferol  5 mcg Intravenous Q M,W,F  . feeding supplement (PROSource TF)  45 mL Per Tube BID  . feeding supplement (VITAL HIGH PROTEIN)  1,000 mL Per Tube Q24H  . guaiFENesin  15 mL Per Tube Q4H  . heparin injection (subcutaneous)  5,000 Units Subcutaneous Q8H  . lactobacillus  1 g Per Tube TID WC  . mouth rinse  15 mL Mouth Rinse q12n4p  . mouth rinse  15 mL Mouth Rinse 10 times per day  . melatonin  5 mg Per Tube QHS  . metoprolol tartrate  5 mg Intravenous Q8H  . midodrine  20 mg Per Tube TID WC  . multivitamin  1 tablet Per Tube QHS  . pantoprazole sodium  40 mg Per Tube BID  . polyethylene glycol  17 g Per Tube Daily  . QUEtiapine  100 mg Per Tube QHS  . sevelamer carbonate  1.6 g Per Tube TID WC  . sodium chloride flush  10 mL Intracatheter Q8H  . sodium chloride flush  10-40 mL Intracatheter Q12H  . sodium chloride flush  3 mL Intravenous Q12H  . sodium zirconium cyclosilicate  10 g Per Tube BID  . sucralfate  1 g Per Tube QID  . ticagrelor  90 mg Per Tube BID   Continuous Infusions: . sodium chloride 10 mL/hr (11/21/20 1722)  . sodium chloride Stopped (10/28/20 0954)  . ceFEPime (MAXIPIME) IV    . ceFEPime (MAXIPIME) IV    . dextrose  40 mL/hr at 11/27/20 0115  . propofol (DIPRIVAN) infusion 10 mcg/kg/min (11/26/20 2153)  . vancomycin    . vancomycin     PRN Meds: sodium chloride, acetaminophen, fentaNYL (SUBLIMAZE) injection, fentaNYL (SUBLIMAZE) injection, heparin, LORazepam, ondansetron (ZOFRAN) IV, Resource ThickenUp Clear, senna, silver nitrate applicators, sodium chloride, sodium chloride flush, sodium chloride flush   Vital Signs    Vitals:   11/27/20 0500 11/27/20 0600 11/27/20 0700 11/27/20 0751  BP: 118/80 114/76 115/76 119/77  Pulse: (!) 103 (!) 103 (!) 102 (!) 102  Resp: (!) 23 13 14 20   Temp:      TempSrc:      SpO2: 100% 100% 100% 100%  Weight:      Height:        Intake/Output Summary (Last 24 hours) at 11/27/2020 0815 Last data filed at 11/27/2020 0700 Gross per 24 hour  Intake 346.61 ml  Output 2500 ml  Net -2153.39 ml   Last 3 Weights 11/25/2020 11/25/2020 11/24/2020  Weight (lbs) 176 lb 2.4 oz 189 lb 2.5 oz 190 lb 11.2 oz  Weight (kg) 79.9 kg 85.8 kg 86.5 kg      Telemetry    Sinus  rhythm- Personally Reviewed  ECG    Normal sinus rhythm at 98 with septal Q waves- Personally Reviewed  Physical Exam   GEN: No acute distress.  Alert, intubated Neck: No JVD Cardiac: RRR, no murmurs, rubs, or gallops.  Respiratory: Clear to auscultation bilaterally. GI: Soft, nontender, non-distended  MS: No edema; No deformity. Neuro:  Nonfocal  Psych: Normal affect   Labs    High Sensitivity Troponin:   Recent Labs  Lab 10/28/20 2040 10/29/20 2121 10/30/20 0548 11/26/20 0818 11/26/20 0935  TROPONINIHS >27,000* >27,000* >27,000* 287* 750*      Chemistry Recent Labs  Lab 11/21/20 1045 11/22/20 0444 11/23/20 0425 11/25/20 0103 11/26/20 0043 11/26/20 1827 11/27/20 0518  NA 132* 130* 130* 132* 135 137 134*  K 4.6 4.6 5.1 5.1 4.2 3.9 4.0  CL 94* 93* 91* 94* 96*  --  96*  CO2 28 25 23 26 30   --  28  GLUCOSE 112* 90 84 79 82  --  84  BUN 64* 76* 95* 76* 40*  --  29*   CREATININE 7.07* 8.08* 9.78* 8.66* 6.10*  --  5.81*  CALCIUM 9.0 9.0 8.9 9.2 9.1  --  9.6  PROT  --  5.1* 4.9*  --   --   --   --   ALBUMIN 1.2* 1.2*  1.2* 1.2*  --   --   --   --   AST  --  18 17  --   --   --   --   ALT  --  6 <5  --   --   --   --   ALKPHOS  --  98 99  --   --   --   --   BILITOT  --  1.0 0.9  --   --   --   --   GFRNONAA 8* 7* 6* 7* 10*  --  11*  ANIONGAP 10 12 16* 12 9  --  10     Hematology Recent Labs  Lab 11/23/20 0425 11/24/20 0605 11/25/20 0103 11/25/20 1725 11/26/20 0043 11/26/20 1827  WBC 10.9* 11.7* 14.8*  --   --   --   RBC 2.40* 2.45* 2.37*  --  2.70*  --   HGB 7.1* 7.0* 6.7* 7.8*  --  9.5*  HCT 22.0* 22.6* 21.5* 24.6*  --  28.0*  MCV 91.7 92.2 90.7  --   --   --   MCH 29.6 28.6 28.3  --   --   --   MCHC 32.3 31.0 31.2  --   --   --   RDW 14.6 14.6 14.2  --   --   --   PLT 481* 520* 488*  --   --   --     BNPNo results for input(s): BNP, PROBNP in the last 168 hours.   DDimer No results for input(s): DDIMER in the last 168 hours.   Radiology    DG Abd 1 View  Result Date: 11/26/2020 CLINICAL DATA:  Enteric tube placement EXAM: ABDOMEN - 1 VIEW COMPARISON:  November 09, 2020 FINDINGS: The tip of the enteric tube projects over the gastric body. The tip is pointed distally. Hazy bilateral airspace opacities are noted at the lung bases. There is a probable right-sided pleural effusion. IMPRESSION: Enteric tube projects over the gastric body. Electronically Signed   By: Constance Holster M.D.   On: 11/26/2020 18:43   CT CHEST WO CONTRAST  Result Date:  11/26/2020 CLINICAL DATA:  Respiratory failure. EXAM: CT CHEST WITHOUT CONTRAST TECHNIQUE: Multidetector CT imaging of the chest was performed following the standard protocol without IV contrast. COMPARISON:  CT chest dated November 18, 2020. FINDINGS: Cardiovascular: The heart size is mildly enlarged. There is a small pericardial effusion. There is a probable ascending thoracic aortic aneurysm  measuring up to 4.4 cm. However this is suboptimally evaluated secondary to pulsation artifact. Advanced coronary artery calcifications are noted. Mediastinum/Nodes: -- No mediastinal lymphadenopathy. -- No hilar lymphadenopathy. -- No axillary lymphadenopathy. -- No supraclavicular lymphadenopathy. --there is a left-sided thyroid nodule measuring approximately 2.3 cm. -  Unremarkable esophagus. Lungs/Pleura: Diffuse perihilar ground-glass airspace opacities and consolidation are noted. This is new since the patient's prior study dated 11/18/2020. The endotracheal tube terminates above the carina. There is no pneumothorax. There is interlobular septal thickening. There is dense consolidation at the left lung base. There is a persistent mass in the peripheral right middle lobe measuring approximately 4.3 cm. Portions of this mass suggests underlying cavitation. There are bilateral pleural effusions that appear to be moderate size but are not well evaluated secondary to extensive streak artifact and lack of IV contrast. Upper Abdomen: There is no acute abnormality in the upper abdomen. The tip of the enteric tube is not visualized on this study. Musculoskeletal: No chest wall abnormality. No bony spinal canal stenosis. IMPRESSION: 1. Lines and tubes as above. 2. Diffuse perihilar ground-glass airspace opacities and consolidation with an upper lobe predominance. This is favored to be secondary to pulmonary edema or developing ARDS. An atypical infectious process is not excluded. 3. Bibasilar consolidation and bilateral pleural effusions are noted. 4. Persistent mass in the right middle lobe with underlying cavitation follow-up to radiologic resolution is recommended. 5. Cardiomegaly. 6. Ascending thoracic aortic aneurysm that appears to be measuring up to approximately 4.3 cm but is suboptimally evaluated secondary to pulsation artifact. Recommend annual imaging followup by CTA or MRA. This recommendation follows 2010  ACCF/AHA/AATS/ACR/ASA/SCA/SCAI/SIR/STS/SVM Guidelines for the Diagnosis and Management of Patients with Thoracic Aortic Disease. Circulation. 2010; 121: U440-H474. Aortic aneurysm NOS (ICD10-I71.9) 7. No pneumothorax.  No displaced rib fracture. 8. Left-sided thyroid nodule measuring approximately 2.3 cm. Recommend thyroid US as an outpatient(ref: J Am Coll Radiol. 2015 Feb;12(2): 143-50). Aortic Atherosclerosis (ICD10-I70.0). Electronically Signed   By: Constance Holster M.D.   On: 11/26/2020 22:28   DG CHEST PORT 1 VIEW  Result Date: 11/26/2020 CLINICAL DATA:  Short of breath, abnormal chest x-ray, intubated EXAM: PORTABLE CHEST 1 VIEW COMPARISON:  11/26/2020 at 5:35 p.m. FINDINGS: Single frontal view of the chest demonstrates endotracheal tube overlying tracheal air column, tip just below thoracic inlet. Enteric catheter passes below diaphragm, side port projecting at the gastroesophageal junction. The tip is excluded by collimation. Cardiac silhouette remains enlarged. Bilateral upper lobe predominant perihilar airspace disease and right pleural effusion are unchanged. No pneumothorax. IMPRESSION: 1. Support devices as above. 2. Stable pulmonary edema and right pleural effusion. Electronically Signed   By: Randa Ngo M.D.   On: 11/26/2020 21:00   DG CHEST PORT 1 VIEW  Result Date: 11/26/2020 CLINICAL DATA:  Endotracheal tube placement EXAM: PORTABLE CHEST 1 VIEW COMPARISON:  X-ray earlier in the same day. FINDINGS: The endotracheal tube terminates above the carina by approximately 6.6 cm. Diffuse hazy bilateral airspace opacities are again noted, worse in the upper lobes. The heart size remains enlarged. There are bilateral pleural effusions, right greater than left. There is no pneumothorax. IMPRESSION: 1. Lines and tubes as  above. 2. Persistent bilateral pleural effusions with persistent multifocal airspace opacities, improved from earlier the same day. Electronically Signed   By: Constance Holster M.D.   On: 11/26/2020 18:45   DG CHEST PORT 1 VIEW  Result Date: 11/26/2020 CLINICAL DATA:  Acute onset shortness of breath. EXAM: PORTABLE CHEST 1 VIEW COMPARISON:  Single-view of the chest 11/24/2020 and 11/26/2020. FINDINGS: Extensive bilateral airspace disease is not notably changed in appearance compared to yesterday's examination. There are likely small to moderate bilateral pleural effusions. Cardiomegaly. Aortic atherosclerosis. No pneumothorax. IMPRESSION: No marked change in extensive bilateral airspace disease and likely pleural effusions. Findings could be due to multifocal pneumonia and/or congestive failure. Cardiomegaly. Electronically Signed   By: Inge Rise M.D.   On: 11/26/2020 14:55   DG CHEST PORT 1 VIEW  Result Date: 11/26/2020 CLINICAL DATA:  Hypoxia. EXAM: PORTABLE CHEST 1 VIEW COMPARISON:  11/24/2020 FINDINGS: Stable cardiomegaly. Worsening symmetric bilateral pulmonary airspace disease is seen, with central perihilar predominance, highly suspicious for pulmonary edema. Small right pleural effusion shows is significant change. The right pleural pigtail catheter has been removed since prior study, however no pneumothorax is visualized. IMPRESSION: Worsening symmetric bilateral pulmonary airspace disease, highly suspicious for pulmonary edema. Stable small right pleural effusion and cardiomegaly. Electronically Signed   By: Marlaine Hind M.D.   On: 11/26/2020 08:22   ECHOCARDIOGRAM LIMITED  Result Date: 11/26/2020    ECHOCARDIOGRAM LIMITED REPORT   Patient Name:   DEVINN HURWITZ Date of Exam: 11/26/2020 Medical Rec #:  [id number]         Height:       71.0 in Accession #:    [id number]        Weight:       176.1 lb Date of Birth:  [birth year]        BSA:          1.998 m Patient Age:    57 years          BP:           113/72 mmHg Patient Gender: M                 HR:           107 bpm. Exam Location:  Inpatient Procedure: 2D Echo STAT ECHO Indications:     abnormal  heart sounds  History:         Patient has prior history of Echocardiogram examinations, most                  recent 11/13/2020. CAD, end stage renal disease., mitral                  regurgitation, Arrythmias:Atrial Fibrillation; Risk                  Factors:Dyslipidemia and Hypertension.  Sonographer:     Johny Chess Referring Phys:  [place] Diagnosing Phys: Eleonore Chiquito MD IMPRESSIONS  1. Left ventricular ejection fraction, by estimation, is 45 to 50%. The left ventricle has mildly decreased function. The left ventricle demonstrates regional wall motion abnormalities (see scoring diagram/findings for description). There is moderate concentric left ventricular hypertrophy. Indeterminate diastolic filling due to E-A fusion.  2. Right ventricular systolic function is normal. The right ventricular size is normal. Tricuspid regurgitation signal is inadequate for assessing PA pressure.  3. There is mild to moderate MR due to restricted PMVL due to mitral annular calcification (IIIB). MR is  slightly worse than the prior study, but does not appear to be severe. The mitral valve is grossly normal. Mild to moderate mitral valve regurgitation. No evidence of mitral stenosis. Moderate mitral annular calcification.  4. The aortic valve is tricuspid. There is mild calcification of the aortic valve. There is mild thickening of the aortic valve. Aortic valve regurgitation is trivial. Mild to moderate aortic valve sclerosis/calcification is present, without any evidence of aortic stenosis.  5. The inferior vena cava is dilated in size with >50% respiratory variability, suggesting right atrial pressure of 8 mmHg. Comparison(s): No significant change from prior study. FINDINGS  Left Ventricle: Left ventricular ejection fraction, by estimation, is 45 to 50%. The left ventricle has mildly decreased function. The left ventricle demonstrates regional wall motion abnormalities. The left ventricular internal cavity  size was normal in size. There is moderate concentric left ventricular hypertrophy. Indeterminate diastolic filling due to E-A fusion.  LV Wall Scoring: The mid inferoseptal segment, apical septal segment, and apex are hypokinetic. Right Ventricle: The right ventricular size is normal. No increase in right ventricular wall thickness. Right ventricular systolic function is normal. Tricuspid regurgitation signal is inadequate for assessing PA pressure. Mitral Valve: There is mild to moderate MR due to restricted PMVL due to mitral annular calcification (IIIB). MR is slightly worse than the prior study, but does not appear to be severe. The mitral valve is grossly normal. Moderate mitral annular calcification. Mild to moderate mitral valve regurgitation, with posteriorly-directed jet. No evidence of mitral valve stenosis. Tricuspid Valve: The tricuspid valve is grossly normal. Tricuspid valve regurgitation is trivial. No evidence of tricuspid stenosis. Aortic Valve: The aortic valve is tricuspid. There is mild calcification of the aortic valve. There is mild thickening of the aortic valve. Aortic valve regurgitation is trivial. Mild to moderate aortic valve sclerosis/calcification is present, without any evidence of aortic stenosis. Aortic valve mean gradient measures 12.7 mmHg. Aortic valve peak gradient measures 22.8 mmHg. Aortic valve area, by VTI measures 2.82 cm. Pulmonic Valve: The pulmonic valve was grossly normal. Pulmonic valve regurgitation is not visualized. No evidence of pulmonic stenosis. Aorta: The aortic root and ascending aorta are structurally normal, with no evidence of dilitation. Venous: The inferior vena cava is dilated in size with greater than 50% respiratory variability, suggesting right atrial pressure of 8 mmHg. Additional Comments: There is a small pleural effusion in the left lateral region. LEFT VENTRICLE PLAX 2D LVIDd:         5.80 cm LVIDs:         4.60 cm LV PW:         1.50 cm LV IVS:         1.30 cm LVOT diam:     2.20 cm LV SV:         109 LV SV Index:   55 LVOT Area:     3.80 cm  LV Volumes (MOD) LV vol d, MOD A4C: 169.0 ml LV vol s, MOD A4C: 104.0 ml LV SV MOD A4C:     169.0 ml IVC IVC diam: 2.30 cm LEFT ATRIUM         Index LA diam:    5.50 cm 2.75 cm/m  AORTIC VALVE AV Area (Vmax):    2.42 cm AV Area (Vmean):   2.50 cm AV Area (VTI):     2.82 cm AV Vmax:           238.90 cm/s AV Vmean:  168.751 cm/s AV VTI:            0.388 m AV Peak Grad:      22.8 mmHg AV Mean Grad:      12.7 mmHg LVOT Vmax:         152.00 cm/s LVOT Vmean:        111.000 cm/s LVOT VTI:          0.288 m LVOT/AV VTI ratio: 0.74  AORTA Ao Root diam: 3.50 cm Ao Asc diam:  3.80 cm  SHUNTS Systemic VTI:  0.29 m Systemic Diam: 2.20 cm Eleonore Chiquito MD Electronically signed by Eleonore Chiquito MD Signature Date/Time: 11/26/2020/12:08:55 PM    Final (Updated)     Cardiac Studies   2D echocardiogram (11/26/2020)  IMPRESSIONS    1. Left ventricular ejection fraction, by estimation, is 45 to 50%. The  left ventricle has mildly decreased function. The left ventricle  demonstrates regional wall motion abnormalities (see scoring  diagram/findings for description). There is moderate  concentric left ventricular hypertrophy. Indeterminate diastolic filling  due to E-A fusion.  2. Right ventricular systolic function is normal. The right ventricular  size is normal. Tricuspid regurgitation signal is inadequate for assessing  PA pressure.  3. There is mild to moderate MR due to restricted PMVL due to mitral  annular calcification (IIIB). MR is slightly worse than the prior study,  but does not appear to be severe. The mitral valve is grossly normal. Mild  to moderate mitral valve  regurgitation. No evidence of mitral stenosis. Moderate mitral annular  calcification.  4. The aortic valve is tricuspid. There is mild calcification of the  aortic valve. There is mild thickening of the aortic valve. Aortic  valve  regurgitation is trivial. Mild to moderate aortic valve  sclerosis/calcification is present, without any  evidence of aortic stenosis.  5. The inferior vena cava is dilated in size with >50% respiratory  variability, suggesting right atrial pressure of 8 mmHg.   Comparison(s): No significant change from prior study.   Cardiac catheterization/PCI stent (10/28/2020)  Conclusion    There is mild to moderate left ventricular systolic dysfunction. The left ventricular ejection fraction is 40-45% by visual estimate - > anterior-anterolateral apical hypokinesis  LV end diastolic pressure is severely elevated -> on recheck post PCI, EDP 30 mmHg  Mid LAD lesion is 99% stenosed. 1st Diag lesion is 55% stenosed just proximal to the lesion  A drug-eluting stent was successfully placed crossing the diagonal branch, using a [employer] [id number]. Postdilated to 3.1 mm  Post intervention, there is a 0% residual stenosis.  LPAV lesion is 99% stenosed -> ostial lesion (LCx is 90  turn followed by another 90  turn into the AVG)  Prox RCA lesion is 80% stenosed -> concentric napkin ring calcified lesion.   SUMMARY  Acute anterolateral ST elevation MI with Culprit lesion being the 99% mid LAD lesion (just past major 1st Diag/D1) along with multivessel CAD including 99% ostial AV groove LCx and 80% calcified napkin ring proximal RCA lesion with extensive calcification throughout the RCA. ? Successful PTCA and DES PCI of the LAD crossing D1 -resolute Onyx DES 2.75 mm x 18 mm postdilated to 3.1 mm. ? With PCI, there was stabilization of significant ectopy, AIVR and PVCs  Mild to moderate reduced EF with anterior anterolateral hypokinesis, EF roughly 40 to 45%.  Initial evaluation suggested normal EDP, but on recheck post PCI LVEDP of 30 mmHg, severely elevated consistent with ACUTE DIASTOLIC  HEART FAILURE  Significantly dilated aortic root requiring AL-1 guide catheter for both Left  and Right Coronary Angiography.   RECOMMENDATIONS  Admit to CCU, restart IV heparin 8 hours after sheath removal  Check 2D echo for better assessment of EF  Plan to review cath films with other IC cardiologist, anticipate staged PCI of at least the RCA which may require atherectomy versus lithotripsy, and likely the ostial AV groove circumflex.  If he has signs of dyspnea, consider earlier dialysis than tomorrow. ->  Would consult for nephrology to see today.  He is already on labetalol and Norvasc.  We will continue current home medications.  Uninterrupted DAPT times X 1 year. Coronary Diagrams   Diagnostic Dominance: Right    Intervention       Patient Profile     MARQUEE FUCHS is a 59 y.o. male with ESRD, hypertension, tobacco abuse, CAD who was admitted on 10/28/2020 for anterior wall STEMI.  He has residual RCA disease.  Course complicated by A. fib with RVR.  Course also complicated by MSSA bacteremia with shock, upper GI bleeding secondary to severe esophagitis/duodenal ulcer.  Course has also been complicated by bilateral lung abscesses and empyema requiring chest tubes.  He has been to the ICU twice.  Once for his anterior wall STEMI.  And then back for respiratory failure and chest tube insertion.  He was transferred out of the ICU on 11/25/2020.  On 11/26/2020 he developed acute hypoxic respiratory failure that is ongoing.  Assessment & Plan    1: Acute hypoxic respiratory failure-patient was ambulating in the hallway up until this weekend when he had acute respiratory decompensation.  He is placed on BiPAP and then intubated.  His chest x-ray showed progressive infiltrates.  2D echo showed no changes with mild to moderate MR and an EF of 45%.  He was dialyzed twice for eval volume overload.  He has good saturations nail at 95%.  He was in A. fib and currently is back to sinus rhythm.  Ventilator management per PCCM.  2: PAF-patient was in PAF yesterday with  RVR currently in sinus rhythm/sinus tach on p.o. amiodarone.  I believe this is driven by his pulmonary decompensation.  He is not a candidate for anticoagulation at this time given his prior GI bleed.  3: CAD-anterior STEMI 10/28/2020 treated with PCI and stenting of his mid LAD by Dr. [name].  He did have residual RCA and ostial nondominant circumflex disease.  He is on Brilinta.  He is cardiovascular stable.  Unfortunately, he cannot come off his antiplatelet therapy.  4: Ischemic cardiomyopathy-EF by limited 2D echo yesterday was stable at 40 to 45%.  He is on beta-blocker.  He is a dialysis patient.  We will hold off on optimal medical therapy until he is more stable.  5: Mitral regurgitation-to moderate by 2D echo yesterday, unchanged  6: Anemia-hemoglobin 9.5 yesterday.  He does have a history of GI bleed.  He was transfused during hemodialysis.  7: Chronic renal insufficiency-on renal replacement therapy, followed by the nephrology service  8: Infectious disease-pneumonia/empyema.  On broad-spectrum antibiotics      For questions or updates, please contact [place] Please consult [email or web address] for contact info under        Signed, Quay Burow, MD  11/27/2020, 8:15 AM

## 2020-11-27 NOTE — Progress Notes (Signed)
Physical Therapy Treatment Patient Details Name: Travis [name] MRN: [id number] DOB: [birth year] Today's Date: 11/27/2020    History of Present Illness Pt is 59 y.o. male with past medical history of end-stage renal disease on hemodialysis, essential hypertension, dilated ascending aorta and hyperlipidemia who presented on November 20 with anterior ST elevation myocardial infarction.  VDRF 11/23-12/3.  Stent placed 11/20.  Afib with RVR with cardioversion 11/21. Suspected cholecystolithiasis 11/29. Respiratory failure and reintubated 11/26/20.    PT Comments    Pt admitted with above diagnosis. Pt was able to stand at EOB with +2 min assist for standing to RW. Pt glad to get off buttocks but did fatigue quickly in standing. Pt was positioned on his side after lying pt back down.  Pt goals remain appropriate even though pt is now intubated.  Will continue to follow acutely.  Pt currently with functional limitations due to the deficits listed below (see PT Problem List). Pt will benefit from skilled PT to increase their independence and safety with mobility to allow discharge to the venue listed below.     Follow Up Recommendations  CIR     Equipment Recommendations  Wheelchair (measurements PT);Wheelchair cushion (measurements PT);3in1 (PT)    Recommendations for Other Services       Precautions / Restrictions Precautions Precautions: Fall Restrictions Other Position/Activity Restrictions: watch BP    Mobility  Bed Mobility Overal bed mobility: Needs Assistance Bed Mobility: Rolling;Sidelying to Sit;Sit to Sidelying Rolling: Mod assist;+2 for physical assistance Sidelying to sit: Mod assist;+2 for physical assistance     Sit to sidelying: Mod assist;+2 for physical assistance General bed mobility comments: Mod A for rolling, bringing BLES over EOB, and then elevating trunk  Transfers Overall transfer level: Needs assistance Equipment used: Rolling walker (2  wheeled) Transfers: Sit to/from Stand Sit to Stand: Min assist;+2 physical assistance;From elevated surface         General transfer comment: Min A +2 to power up and gain balance. elevated bed for assistance into standing.  Stood x 2 for 2 min the first attempt and 1 min the seconds attempt.  Ambulation/Gait                 Stairs             Wheelchair Mobility    Modified Rankin (Stroke Patients Only)       Balance Overall balance assessment: Needs assistance Sitting-balance support: No upper extremity supported;Feet supported Sitting balance-Leahy Scale: Fair   Postural control:  (sitting EoB, requires maximal assist for balance to keep off sacral wound) Standing balance support: Bilateral upper extremity supported Standing balance-Leahy Scale: Poor Standing balance comment: Requiring use of UE on RW and +2 min                            Cognition Arousal/Alertness: Awake/alert Behavior During Therapy: WFL for tasks assessed/performed Overall Cognitive Status: Difficult to assess                                 General Comments: Difficult to assess as pt is re-intubated. Pt eager to use communication board to verbalize thoughts. Feel pt is close to baseline cognition.      Exercises General Exercises - Lower Extremity Long Arc Quad: AROM;10 reps;Seated;Both    General Comments General comments (skin integrity, edema, etc.): HR 100-120s. SPO2 >90 on vent with  FiO2 at 50% and PEEP 14. RR elevating to 30s with effort and pain.      Pertinent Vitals/Pain Pain Assessment: Faces Faces Pain Scale: Hurts little more Pain Location: bottom Pain Descriptors / Indicators: Discomfort;Sore Pain Intervention(s): Limited activity within patient's tolerance;Monitored during session;Repositioned    Home Living                      Prior Function            PT Goals (current goals can now be found in the care plan section)  Acute Rehab PT Goals Patient Stated Goal: Move better and get stronger Progress towards PT goals: Progressing toward goals    Frequency    Min 3X/week      PT Plan Current plan remains appropriate    Co-evaluation PT/OT/SLP Co-Evaluation/Treatment: Yes Reason for Co-Treatment: Complexity of the patient's impairments (multi-system involvement);For patient/therapist safety PT goals addressed during session: Mobility/safety with mobility OT goals addressed during session: ADL's and self-care      AM-PAC PT "6 Clicks" Mobility   Outcome Measure  Help needed turning from your back to your side while in a flat bed without using bedrails?: None Help needed moving from lying on your back to sitting on the side of a flat bed without using bedrails?: A Lot Help needed moving to and from a bed to a chair (including a wheelchair)?: Total Help needed standing up from a chair using your arms (e.g., wheelchair or bedside chair)?: Total Help needed to walk in hospital room?: Total Help needed climbing 3-5 steps with a railing? : Total 6 Click Score: 10    End of Session Equipment Utilized During Treatment: Oxygen;Gait belt Activity Tolerance: Patient limited by fatigue;Patient limited by pain Patient left: with call bell/phone within reach;with family/visitor present;in bed;with bed alarm set Nurse Communication: Mobility status PT Visit Diagnosis: Muscle weakness (generalized) (M62.81)     Time: 8016-5537 PT Time Calculation (min) (ACUTE ONLY): 27 min  Charges:  $Therapeutic Activity: 8-22 mins                     Traquan Duarte W,PT Acute Rehabilitation Services Pager:  [phone]  Office:  [place] 11/27/2020, 3:17 PM

## 2020-11-27 NOTE — Consult Note (Signed)
[place] Nurse wound follow up Patient receiving care in [place]. PT at bedside at time of assessment. Wound type: stage 4 sacral wound Measurement: See PT notes for details. Wound bed: Drainage (amount, consistency, odor)  Periwound: Dressing procedure/placement/frequency: Santyl and moistened gauze today, then bid 1/4% Dakin's solution moistened gauze beginning 12/21; then, back to daily santyl.  Hydrotherapy to continue. Val Riles, RN, MSN, CWOCN, CNS-BC, pager [phone]

## 2020-11-27 NOTE — Progress Notes (Addendum)
Nutrition Follow-up / Consult  DOCUMENTATION CODES:   Severe malnutrition in context of chronic illness  INTERVENTION:   Initiate tube feeding via OG tube: Vital 1.5 at 50 ml/h (1200 ml per day) Prosource TF 90 ml TID  Provides 2040 kcal (2167 kcal total with propofol), 147 gm protein, 917 ml free water daily  Continue Rena-vit daily via tube  NUTRITION DIAGNOSIS:   Severe Malnutrition related to chronic illness as evidenced by severe muscle depletion,severe fat depletion.  Ongoing   GOAL:   Patient will meet greater than or equal to 90% of their needs  Progressing with initiation of TF  MONITOR:   PO intake,Supplement acceptance,TF tolerance,Labs,Weight trends,Skin  REASON FOR ASSESSMENT:   Consult Enteral/tube feeding initiation and management  ASSESSMENT:   Patient with PMH significant for HTN and ESRD in HD. Presents this admission with STEMI.  Patient was eating well and Cortrak tube was removed last week. Patient required re-intubation 12/19. Received MD Consult for TF initiation and management. OG tube in place.  Receiving 1 unit PRBCs today for hgb 6.9. S/P hydrotherapy wound treatment with PT today.  S/P HD 12/18, 1.5 L removed. S/P HD 12/19, 2.5 L removed. Receiving iHD again today.  Chest tube placed 12/13, no output documented since 12/15.  Patient is currently intubated on ventilator support MV: 12.2 L/min Temp (24hrs), Avg:99.5 F (37.5 C), Min:98.5 F (36.9 C), Max:100.1 F (37.8 C)  Propofol: 4.8 ml/hr providing 127 kcal from lipid  Labs reviewed. Sodium 134, BUN 29, Creat 5.81, phos 5.2, B-12: 1116  CBG: [phone]  Medications reviewed and include Aranesp, Colace, lactobacillus, Rena-vit, Miralax, Renvela TID per tube, Lokelma, propofol.  Weight down to 79.9 kg 12/18 from 86.4 kg 11/20  I/O +3.2 L since admission  Diet Order:   Diet Order            Diet NPO time specified  Diet effective now                 EDUCATION  NEEDS:   No education needs have been identified at this time  Skin:  Skin Assessment: Skin Integrity Issues: Skin Integrity Issues:: Incisions,Unstageable,Stage IV Stage II: n/a Stage IV: bilateral buttocks Unstageable: bilateral buttocks Incisions: L arm  Last BM:  12/17  Height:   Ht Readings from Last 1 Encounters:  11/26/20 5\' 11"  (1.803 m)    Weight:   Wt Readings from Last 1 Encounters:  11/25/20 79.9 kg    BMI:  Body mass index is 24.57 kg/m.  Estimated Nutritional Needs:   Kcal:  2060  Protein:  135-160 g  Fluid:  1000 mL plus UOP    Lucas Mallow, RD, LDN, CNSC Please refer to Amion for contact information.

## 2020-11-28 ENCOUNTER — Inpatient Hospital Stay (HOSPITAL_COMMUNITY): Payer: Medicare Other

## 2020-11-28 DIAGNOSIS — R0603 Acute respiratory distress: Secondary | ICD-10-CM | POA: Diagnosis not present

## 2020-11-28 LAB — TYPE AND SCREEN
ABO/RH(D): O POS
Antibody Screen: NEGATIVE
Unit division: 0
Unit division: 0

## 2020-11-28 LAB — VITAMIN A: Vitamin A (Retinoic Acid): 60.7 ug/dL (ref 20.1–62.0)

## 2020-11-28 LAB — BPAM RBC
Blood Product Expiration Date: 202201182359
Blood Product Expiration Date: 202201182359
ISSUE DATE / TIME: 202112180526
ISSUE DATE / TIME: 202112201231
Unit Type and Rh: 5100
Unit Type and Rh: 5100

## 2020-11-28 LAB — RENAL FUNCTION PANEL
Albumin: 1.7 g/dL — ABNORMAL LOW (ref 3.5–5.0)
Anion gap: 12 (ref 5–15)
BUN: 24 mg/dL — ABNORMAL HIGH (ref 6–20)
CO2: 28 mmol/L (ref 22–32)
Calcium: 9.2 mg/dL (ref 8.9–10.3)
Chloride: 94 mmol/L — ABNORMAL LOW (ref 98–111)
Creatinine, Ser: 5.25 mg/dL — ABNORMAL HIGH (ref 0.61–1.24)
GFR, Estimated: 12 mL/min — ABNORMAL LOW (ref >60)
Glucose, Bld: 81 mg/dL (ref 70–99)
Phosphorus: 3.6 mg/dL (ref 2.5–4.6)
Potassium: 2.6 mmol/L — CL (ref 3.5–5.1)
Sodium: 134 mmol/L — ABNORMAL LOW (ref 135–145)

## 2020-11-28 LAB — CBC
HCT: 22.9 % — ABNORMAL LOW (ref 39.0–52.0)
Hemoglobin: 7.4 g/dL — ABNORMAL LOW (ref 13.0–17.0)
MCH: 29.6 pg (ref 26.0–34.0)
MCHC: 32.3 g/dL (ref 30.0–36.0)
MCV: 91.6 fL (ref 80.0–100.0)
Platelets: 371 10*3/uL (ref 150–400)
RBC: 2.5 MIL/uL — ABNORMAL LOW (ref 4.22–5.81)
RDW: 14.6 % (ref 11.5–15.5)
WBC: 15.9 10*3/uL — ABNORMAL HIGH (ref 4.0–10.5)
nRBC: 0 % (ref 0.0–0.2)

## 2020-11-28 LAB — MAGNESIUM: Magnesium: 1.8 mg/dL (ref 1.7–2.4)

## 2020-11-28 LAB — BASIC METABOLIC PANEL
Anion gap: 14 (ref 5–15)
BUN: 26 mg/dL — ABNORMAL HIGH (ref 6–20)
CO2: 27 mmol/L (ref 22–32)
Calcium: 9.3 mg/dL (ref 8.9–10.3)
Chloride: 92 mmol/L — ABNORMAL LOW (ref 98–111)
Creatinine, Ser: 5.71 mg/dL — ABNORMAL HIGH (ref 0.61–1.24)
GFR, Estimated: 11 mL/min — ABNORMAL LOW (ref >60)
Glucose, Bld: 91 mg/dL (ref 70–99)
Potassium: 2.9 mmol/L — ABNORMAL LOW (ref 3.5–5.1)
Sodium: 133 mmol/L — ABNORMAL LOW (ref 135–145)

## 2020-11-28 LAB — GLUCOSE, CAPILLARY
Glucose-Capillary: 113 mg/dL — ABNORMAL HIGH (ref 70–99)
Glucose-Capillary: 63 mg/dL — ABNORMAL LOW (ref 70–99)
Glucose-Capillary: 70 mg/dL (ref 70–99)
Glucose-Capillary: 81 mg/dL (ref 70–99)
Glucose-Capillary: 83 mg/dL (ref 70–99)
Glucose-Capillary: 86 mg/dL (ref 70–99)
Glucose-Capillary: 98 mg/dL (ref 70–99)

## 2020-11-28 LAB — ZINC: Zinc: 63 ug/dL (ref 44–115)

## 2020-11-28 LAB — VITAMIN C: Vitamin C: 1.9 mg/dL (ref 0.4–2.0)

## 2020-11-28 LAB — TRIGLYCERIDES: Triglycerides: 96 mg/dL (ref <150)

## 2020-11-28 MED ORDER — DEXTROSE 50 % IV SOLN
12.5000 g | Freq: Once | INTRAVENOUS | Status: AC
  Administered 2020-11-28: 08:00:00 12.5 g via INTRAVENOUS
  Filled 2020-11-28: qty 50

## 2020-11-28 MED ORDER — DEXMEDETOMIDINE HCL IN NACL 400 MCG/100ML IV SOLN
0.4000 ug/kg/h | INTRAVENOUS | Status: DC
  Administered 2020-11-28: 15:00:00 0.4 ug/kg/h via INTRAVENOUS
  Administered 2020-11-28: 22:00:00 1.3 ug/kg/h via INTRAVENOUS
  Filled 2020-11-28 (×3): qty 100

## 2020-11-28 MED ORDER — MAGNESIUM SULFATE IN D5W 1-5 GM/100ML-% IV SOLN
1.0000 g | Freq: Once | INTRAVENOUS | Status: AC
  Administered 2020-11-28: 23:00:00 1 g via INTRAVENOUS
  Filled 2020-11-28: qty 100

## 2020-11-28 MED ORDER — POTASSIUM CHLORIDE 20 MEQ PO PACK
40.0000 meq | PACK | Freq: Once | ORAL | Status: AC
  Administered 2020-11-28: 22:00:00 40 meq
  Filled 2020-11-28: qty 2

## 2020-11-28 MED ORDER — POTASSIUM CHLORIDE 20 MEQ PO PACK
20.0000 meq | PACK | Freq: Once | ORAL | Status: AC
  Administered 2020-11-28: 08:00:00 20 meq via ORAL
  Filled 2020-11-28: qty 1

## 2020-11-28 MED ORDER — POTASSIUM CHLORIDE 20 MEQ PO PACK
40.0000 meq | PACK | ORAL | Status: DC
  Administered 2020-11-28: 17:00:00 40 meq via ORAL

## 2020-11-28 MED ORDER — POTASSIUM CHLORIDE 20 MEQ PO PACK: 40.0000 meq | PACK | Freq: Once | ORAL | Status: DC

## 2020-11-28 NOTE — Progress Notes (Signed)
Pt bed alarm battery failure during hydro therapy. WOC reports that th e bed had done this yesterday and they had called to get it fixed, however per night shift they were unable to fix it until after shift change. Pt has non-working bed throughout the day yesterday and was deflated. WOC explained the bed was doing the same thing as yesterday, to prevent further delay in fixing equipment and allow pt to rest comfortable, pt was transferred over to an ICU bed at this time. Charge aware, and bed was picked up to be serviced.

## 2020-11-28 NOTE — Progress Notes (Addendum)
Lengthy conversation with pt and his wife at bedside about recent event.   Pt was getting his wound therapy and was given some ordered fentanyl ( he was additionally on a sbt). Pt fell asleep and when he awoke he felt as though he could not breathe and was going to die.    His oxygen sats never dropped, vent reportedly did not alarm but when he awoke his rr did increase and he was overtly anxious. He was immediately changed to a/c ventilation and pt calmed with assistance of reassurance.   Pt has requested no further fentanyl and this was been taken off his orders.  His wife states he has tolerated dilaudid previously, pt wants no pain meds but will ask when he does need them.  He also has requested we do not attempt another sbt today in light of events.   I attempted to provide reassurance while also validating his experience that the ventilator does have back up rr on pressure support trials should someone become apneic.   I also attempting to encourage repeated trials to try successful extubation but he states he is too anxious.   At this time we will order cortrak and start tf at pt's request as well  vss rn at bedside during conversation

## 2020-11-28 NOTE — Progress Notes (Signed)
[facility] Progress Note   Assessment/ Plan:   OP HD:AF MWF 4h 450/800 81.5kg 2/2.25 bath RUE AVF Hep5000+1067md-run - hect 5 ug tiw  - mircera 50 ugIVq 4 weeks, last 11/15 (due 11/29)  Assessment/ Plan: 1. Acute hypoxic RF: had HD Sunday without improvement and was intubated--> appeared to have frothy pink secretions in ETT.  HD 12/20 with increased UF goal --> on max midodrine + albumin helped.  Next HD planned 12/22. 2. Acute STEMI- S/P LAD PCI11/20, has otherdisease of LCx/RCA.Transitioned to brilinta. Per cardiology. 3. R parapneumonic effusion: Chest tube placed then dislodged, replaced again 12/14.  Per CCM. 4. MSSA cavitary PNA / bacteremia- sp vent support. Blood cx+. Getting 6 wks IV Ancef per pharm ending 12/30/20. F/U bcx's negative. [place] AVF neg for abscess. 5. Hypotension - on max dose midodrine (20 TID) 6. ESRD - usual HD MWF.  Required CRRT from 11/24-12/4.  Tolerating iHD. Unable to use heparin because of GI bleed/anemia. HD today on schedule, had extra HD 12/19.  7. HD access - fistulogram done 12/13 by IR here, treated 2 outflow stenoses w/ good results. Duplex ordered for flow volume and eval A-V anastomosis per [facility] request. We had the temp cath dc'd 12/18 as it was in for over 3 weeks. Using AVF from here forward.  8. PEA arrest:Brief CPR. 9. Sacral decub - moderate size, getting local WC--> hydrotherapy 10. Anemia ckd/ABLA -off of anticoagulation.  Received blood transfusion. Seen by GI, EGD 12/2 showed duodenal ulcers and esophagitis, on PPI per NG tube.  11. Nutrition - TF on hold 12. Secondary hyperparathyroidism: ac sevelamer.  Monitor phosphorus level. 13. Afib:on amio , in NSR. S/p DCCV. 14. Severe MR:per cardiology. 15. Dispo: in ICU still   Subjective:    S/p HD yesterday, 2.3L off.  K 2.6 this AM, lokelma stopped, given 20 Kcl, recheck pending.  Getting hydrotherapy this AM   Objective:   BP 121/77   Pulse  89   Temp 99.7 F (37.6 C) (Oral)   Resp (!) 23   Ht 5' 11"  (1.803 m)   Wt 79.9 kg   SpO2 98%   BMI 24.57 kg/m   Physical Exam: Gen: lying in bed, getting hydrotherapy CVS: RRR Resp: bilateral coarse crackles heard throughout Abd: soft Ext: 1+ pedal edema ACCESS: AVF  Labs: BMET Recent Labs  Lab 11/21/20 1045 11/22/20 0444 11/23/20 0425 11/25/20 0103 11/26/20 0043 11/26/20 1750 11/26/20 1827 11/27/20 0518 11/27/20 1723 11/28/20 0504  NA 132* 130* 130* 132* 135  --  137 134*  --  134*  K 4.6 4.6 5.1 5.1 4.2  --  3.9 4.0  --  2.6*  CL 94* 93* 91* 94* 96*  --   --  96*  --  94*  CO2 28 25 23 26 30   --   --  28  --  28  GLUCOSE 112* 90 84 79 82  --   --  84  --  81  BUN 64* 76* 95* 76* 40*  --   --  29*  --  24*  CREATININE 7.07* 8.08* 9.78* 8.66* 6.10*  --   --  5.81*  --  5.25*  CALCIUM 9.0 9.0 8.9 9.2 9.1  --   --  9.6  --  9.2  PHOS 6.3* 5.4* 5.7*  --   --  4.7*  --  5.2* 3.0 3.6   CBC Recent Labs  Lab 11/24/20 0605 11/25/20 0103 11/25/20 1725 11/26/20 1827  11/27/20 1033 11/28/20 0504  WBC 11.7* 14.8*  --   --  17.4* 15.9*  HGB 7.0* 6.7* 7.8* 9.5* 6.9* 7.4*  HCT 22.6* 21.5* 24.6* 28.0* 21.6* 22.9*  MCV 92.2 90.7  --   --  91.9 91.6  PLT 520* 488*  --   --  446* 371      Medications:    . sodium chloride   Intravenous Once  . sodium chloride   Intravenous Once  . amiodarone  200 mg Per Tube BID  . atorvastatin  80 mg Per Tube q1800  . chlorhexidine  15 mL Mouth Rinse BID  . chlorhexidine gluconate (MEDLINE KIT)  15 mL Mouth Rinse BID  . Chlorhexidine Gluconate Cloth  6 each Topical Q0600  . Chlorhexidine Gluconate Cloth  6 each Topical Q0600  . [START ON 12/01/2020] collagenase   Topical Daily  . [START ON 12/02/2020] darbepoetin (ARANESP) injection - DIALYSIS  60 mcg Intravenous Q Sat-HD  . docusate  100 mg Per Tube BID  . doxercalciferol  5 mcg Intravenous Q M,W,F  . feeding supplement (PROSource TF)  90 mL Per Tube TID  . guaiFENesin  15 mL  Per Tube Q4H  . heparin injection (subcutaneous)  5,000 Units Subcutaneous Q8H  . lactobacillus  1 g Per Tube TID WC  . mouth rinse  15 mL Mouth Rinse 10 times per day  . melatonin  5 mg Per Tube QHS  . metoprolol tartrate  5 mg Intravenous Q8H  . midodrine  20 mg Per Tube TID WC  . multivitamin  1 tablet Per Tube QHS  . pantoprazole sodium  40 mg Per Tube BID  . polyethylene glycol  17 g Per Tube Daily  . QUEtiapine  100 mg Per Tube QHS  . sevelamer carbonate  1.6 g Per Tube TID WC  . sodium chloride flush  10 mL Intracatheter Q8H  . sodium chloride flush  10-40 mL Intracatheter Q12H  . sodium chloride flush  3 mL Intravenous Q12H  . sodium hypochlorite   Topical BID  . sucralfate  1 g Per Tube QID  . ticagrelor  90 mg Per Tube BID     Madelon Lips, MD 11/28/2020, 10:18 AM

## 2020-11-28 NOTE — Progress Notes (Signed)
NAME:  Travis [name], MRN:  [id number], DOB:  1961/03/27, LOS: 69 ADMISSION DATE:  10/28/2020, CONSULTATION DATE:  10/29/20 REFERRING MD:  Cardiology - [name], CHIEF COMPLAINT:  Hypotension, gram positive cocci on culture, AMS  Brief History   Patient is a 59 y.o. male with history of ESRD, HTN, CAD-who presented with anterior STEMI-treated with DES to LAD-hospital course prolonged and complicated by atrial fibrillation with RVR requiring cardioversion x2,, septic shock due to MSSA bacteremia, brief PEA arrest requiring CPR, acute hypoxic respiratory failure requiring intubation, upper GI bleed with acute blood loss anemia due to severe esophagitis requiring PRBC transfusion, severe metabolic encephalopathy/ICU delirium, bilateral lung abscesses with empyema requiring chest tube insertion  Stabilized in the ICU-subsequently transferred to the [facility] hospitalist service on 12/12, his chest tube was dislodged on the floor, he started developing increasing right-sided pleural effusion/empyema, patient was transferred back to ICU and chest tube was placed.   Past Medical History  HTN ESRD, HD MWF  Significant Hospital Events   Cardioversion 11/21 Cardiac cath stent to LAD 11/20  Consults:  PCCM  Nephrology ID  Procedures:  Central line 11/21 >> 11/29 Arterial line 11/21 out Repeat DCCV 11/23 11/23 ETT out HD line 11/24 RIJ >> Arterial line 11/24 out L  CVC 11/30>>  Right-sided chest tube 12/14>>  Significant Diagnostic Tests:   11/24 TEE>>Left ventricular ejection fraction, by estimation, is 35 to 40%. The  left ventricle has moderately decreased function.Moderate to severe mitral regurgitation.  11/23 CT Chest>>Extensive multifocal nodular and patchy airspace disease in both lungs with a slight peripheral predominance in the upper lungs. 3.1 cm nodular consolidative opacity in the right upper lobe is cavitated. Imaging features likely related to multifocal pneumonia. Septic  emboli and metastatic disease considered less likely but not excluded.  11/22 LUE Vas Upper Extremity Doppler>> Arteriovenous fistula-Aneurysmal dilatation noted.  11/24 CT abdomen - no retroperitoneal hematoma  11/29 MR Brain>> No evidence acute intracranial abnormality, sinisitis, mastoid effusions  11/29>> MR Cervical Spine>> Given the provided history of bacteremia, facet joint septic arthritis is difficult to definitively exclude, but the lack of any surrounding marrow edema argues against this Cervical spondylosis   11/29 MR Thoracic Spine No significant marrow edema in thoracic spine , Mild thoracic spondylosis hypointense marrow signal throughout the thoracic spine, likely related to the patient's end-stage renal disease. multifocal airspace disease and cavitary pulmonary lesions. Small right pleural effusion.  11/29 MR Lumbar Spine:As noted above and Suspected cholecystolithiasis  12/11 CT chest  improving air space disease - R empyema persists but adequate chest tube position.   12/19 CXR -bilateral infiltration on the chest x-ray, possible collection right lung base Micro Data:  11/21 BCx2>>Staph aureus > MSSA by BCID 11/22 BCx 2 >> neg 11/23 trach asp >> staph aureus 11/24 BCx2>> neg 11/30 BC x 2>> staph epi>> MRSA 12/2/ BCx 2 >> neg 12/9 right pleural fluid >> negative  Antimicrobials:  Zosyn 11/21 x 1 Ancef 11/21 ->   Interim history/subjective:  12/21: unable to extubate yesterday as after dialysis had increased rr when attempted and entered back into afib with rvr. Will retry today.  12/20:Seen bed on mechanical vent completely alert and oriented Utilizing written communication No acute events overnight Bedside and updated  Objective   Blood pressure 116/78, pulse 89, temperature 99.7 F (37.6 C), temperature source Oral, resp. rate (!) 31, height 5\' 11"  (1.803 m), weight 79.9 kg, SpO2 100 %.    Vent Mode: CPAP;PSV FiO2 (%):  [40 %-  50 %] 40 % Set Rate:  [20  bmp] 20 bmp Vt Set:  [600 mL] 600 mL PEEP:  [5 cmH20-10 cmH20] 5 cmH20 Pressure Support:  [5 cmH20] 5 cmH20 Plateau Pressure:  [20 cmH20-24 cmH20] 20 cmH20   Intake/Output Summary (Last 24 hours) at 11/28/2020 4540 Last data filed at 11/28/2020 0900 Gross per 24 hour  Intake 1278.85 ml  Output 2300 ml  Net -1021.15 ml   Filed Weights   11/24/20 0600 11/25/20 0650 11/25/20 0850  Weight: 86.5 kg 85.8 kg 79.9 kg   Physical exam  General: Mildly deconditioned adult male lying in bed on mechanical ventilation in no acute distress HEENT: ETT, MM pink/moist, PERRL, NCAT Neuro: Alert and oriented x3 able to utilize written communication on vent CV: s1s2 regular rate and rhythm, no murmur, rubs, or gallops,  PULM: Faint lower lobe crackles, tolerating ventilator well, oxygen saturations 100% on 40% FiO2, peep 5 GI: soft, bowel sounds active in all 4 quadrants, non-tender, non-distended, tolerating TF Extremities: warm/dry, no edema  Skin: no rashes or lesions  Resolved Hospital Problem list     Assessment & Plan:  Acute hypoxemic respiratory failure -Chest x-ray continue to worsen despite ability for volume removal over the last 2 sessions however on further history from wife patient had missed several rounds of dialysis prior to transfer back to ICU due to "being bumped" patient also was unable to undergo volume removal on several occasions due to hypotension  -CTA chest shows bilateral groundglass opacities consistent with likely pulmonary edema  P: Continue ventilator support with lung protective strategies  -sbt, pt is alert  -Head of bed elevated 30 degrees. -cxr with improvement in edema noted Ensure adequate pulmonary hygiene once extubated -Cx: ngtd VAP bundle in place  PAD protocol   ESRD on HD- MWF -Suspicion is high that respiratory decompensation was related to volume overload P: Nephrology following, appreciate assistance Increase volume removal per nephrology  through CRRT  Anterior ST elevation MI s/p DES 11/20 Ischemic cardiomyopathy with an ejection fraction of 40 to 45% Paroxysmal atrial fibrillation -Anticoagulation on hold due to recent GI bleed Mitral regurgitation P: Seen by cardiology during admission Continue Brilinta, statin, and amiodarone Currently not candidate for anticoagulation given recent GI bleed Continuous telemetry -afib with rvr yesterday afternoon, improved when changed back to a/c on vent  MSSA bacteremia -Seen with multidrug-resistant Staph epidermidis P: Seen by infectious disease appreciate assistance Recommendations made for 6 weeks of cefazolin will d/w pharmacy and place appropriate end dates.  Cont abx cefazolin Monday Wednesday Friday 12/20 Follow repeat cultures, ngtd  Parapneumonic effusion status post chest tube placement with DNase and TPA on 12/13 P: Routine chest tube care Place chest tube per protocol Monitor tube output  Unstageable sacral ulcer P: WOC consulted Continue hydrotherapy Pressure alleviating devices Every 2 hours turns -for hydrotherapy today.   Generalized deconditioning P: PT/OT efforts when able  Anemia of chronic illness Anemia of critical illness Recent GI bleed  -status post EGD showing severe esophagitis and duodenal ulceration P: Trend CBC Transfuse per protocol Hemoglobin goal greater than 8  Labs   CBC: Recent Labs  Lab 11/23/20 0425 11/24/20 0605 11/25/20 0103 11/25/20 1725 11/26/20 1827 11/27/20 1033 11/28/20 0504  WBC 10.9* 11.7* 14.8*  --   --  17.4* 15.9*  HGB 7.1* 7.0* 6.7* 7.8* 9.5* 6.9* 7.4*  HCT 22.0* 22.6* 21.5* 24.6* 28.0* 21.6* 22.9*  MCV 91.7 92.2 90.7  --   --  91.9 91.6  PLT  481* 520* 488*  --   --  446* 294    Basic Metabolic Panel: Recent Labs  Lab 11/23/20 0425 11/24/20 0605 11/25/20 0103 11/26/20 0043 11/26/20 1750 11/26/20 1827 11/27/20 0518 11/27/20 1723 11/28/20 0504  NA 130*  --  132* 135  --  137 134*  --   134*  K 5.1  --  5.1 4.2  --  3.9 4.0  --  2.6*  CL 91*  --  94* 96*  --   --  96*  --  94*  CO2 23  --  26 30  --   --  28  --  28  GLUCOSE 84  --  79 82  --   --  84  --  81  BUN 95*  --  76* 40*  --   --  29*  --  24*  CREATININE 9.78*  --  8.66* 6.10*  --   --  5.81*  --  5.25*  CALCIUM 8.9  --  9.2 9.1  --   --  9.6  --  9.2  MG 2.8*   < > 2.3 2.2 2.0  --  2.1 1.8 1.8  PHOS 5.7*  --   --   --  4.7*  --  5.2* 3.0 3.6   < > = values in this interval not displayed.   GFR: Estimated Creatinine Clearance: 16.3 mL/min (A) (by C-G formula based on SCr of 5.25 mg/dL (H)). Recent Labs  Lab 11/24/20 0605 11/25/20 0103 11/27/20 1033 11/28/20 0504  WBC 11.7* 14.8* 17.4* 15.9*    Liver Function Tests: Recent Labs  Lab 11/21/20 1045 11/22/20 0444 11/23/20 0425 11/28/20 0504  AST  --  18 17  --   ALT  --  6 <5  --   ALKPHOS  --  98 99  --   BILITOT  --  1.0 0.9  --   PROT  --  5.1* 4.9*  --   ALBUMIN 1.2* 1.2*  1.2* 1.2* 1.7*   No results for input(s): LIPASE, AMYLASE in the last 168 hours. No results for input(s): AMMONIA in the last 168 hours.  ABG    Component Value Date/Time   PHART 7.497 (H) 11/26/2020 1827   PCO2ART 40.7 11/26/2020 1827   PO2ART 77 (L) 11/26/2020 1827   HCO3 31.6 (H) 11/26/2020 1827   TCO2 33 (H) 11/26/2020 1827   ACIDBASEDEF 0.3 11/16/2020 2200   O2SAT 96.0 11/26/2020 1827     Coagulation Profile: No results for input(s): INR, PROTIME in the last 168 hours.  Cardiac Enzymes: No results for input(s): CKTOTAL, CKMB, CKMBINDEX, TROPONINI in the last 168 hours.  HbA1C: Hgb A1c MFr Bld  Date/Time Value Ref Range Status  10/28/2020 06:04 AM 4.3 (L) 4.8 - 5.6 % Final    Comment:    (NOTE) Pre diabetes:          5.7%-6.4%  Diabetes:              >6.4%  Glycemic control for   <7.0% adults with diabetes   01/30/2015 09:09 PM 5.1 4.8 - 5.6 % Final    Comment:    (NOTE)         Pre-diabetes: 5.7 - 6.4         Diabetes: >6.4          Glycemic control for adults with diabetes: <7.0     CBG: Recent Labs  Lab 11/27/20 2021 11/28/20 0001 11/28/20 0351 11/28/20 0758  11/28/20 0832  GLUCAP 81 83 70 63* 113*   CRITICAL CARE Performed by: Audria Nine    The patient is critically ill with multiple organ systems failure and requires high complexity decision making for assessment and support, frequent evaluation and titration of therapies, application of advanced monitoring technologies and extensive interpretation of multiple databases.  Critical care time 41 mins. This represents my time independent of the NP's/PA's/med student/residents time taking care of the pt. This is excluding procedures   [facility] 11/28/2020, 9:28 AM

## 2020-11-28 NOTE — Hospital Course (Signed)
16

## 2020-11-28 NOTE — Progress Notes (Signed)
Physical Therapy Wound Treatment Patient Details  Name: CASTLE LAMONS MRN: [id number] Date of Birth: 09/25/1961  Today's Date: 11/28/2020 Time: 2440-1027 Time Calculation (min): 45 min  Subjective  Subjective: alert on vent Patient and Family Stated Goals: None stated Prior Treatments: Dressing changes  Pain Score: Pt premedicated and was painful at times during treatment.   Wound Assessment  Pressure Injury 11/14/20 Buttocks Mid;Right;Left Unstageable - Full thickness tissue loss in which the base of the injury is covered by slough (yellow, tan, gray, green or brown) and/or eschar (tan, brown or black) in the wound bed. (Active)  Wound Image   11/28/20 1157  Dressing Type ABD;Barrier Film (skin prep);Gauze (Comment);Moist to dry 11/28/20 1157  Dressing Changed;Clean;Dry;Intact 11/28/20 1157  Dressing Change Frequency Daily 11/28/20 1157  State of Healing Early/partial granulation 11/28/20 1157  Site / Wound Assessment Red;Yellow;Black 11/28/20 1157  % Wound base Red or Granulating 50% 11/28/20 1157  % Wound base Yellow/Fibrinous Exudate 40% 11/28/20 1157  % Wound base Black/Eschar 10% 11/28/20 1157  % Wound base Other/Granulation Tissue (Comment) 0% 11/28/20 1157  Peri-wound Assessment Intact;Maceration 11/28/20 1157  Wound Length (cm) 10 cm 11/28/20 1000  Wound Width (cm) 15 cm 11/28/20 1000  Wound Depth (cm) 2 cm 11/28/20 1000  Wound Surface Area (cm^2) 150 cm^2 11/28/20 1000  Wound Volume (cm^3) 300 cm^3 11/28/20 1000  Tunneling (cm) 0 11/22/20 1401  Undermining (cm) 1.2 cm at 12:00 11/28/20 1000  Margins Unattached edges (unapproximated) 11/28/20 1157  Drainage Amount Moderate 11/28/20 1157  Drainage Description Serosanguineous 11/28/20 1157  Treatment Debridement (Selective);Hydrotherapy (Pulse lavage);Packing (Saline gauze) 11/28/20 1157   Santyl applied to wound bed prior to applying dressing.     Hydrotherapy Pulsed lavage therapy - wound location:  Buttocks Pulsed Lavage with Suction (psi): 4 psi (4-8 due to pain) Pulsed Lavage with Suction - Normal Saline Used: 1000 mL Pulsed Lavage Tip: Tip with splash shield Selective Debridement Selective Debridement - Location: Buttocks Selective Debridement - Tools Used: Forceps;Scalpel Selective Debridement - Tissue Removed: brown and yellow unviable tissue   Wound Assessment and Plan  Wound Therapy - Assess/Plan/Recommendations Wound Therapy - Clinical Statement: Dakin's solution began today. Progressing with debridement and wound bed appearance improving. Pt with malfunctioning air mattress and therapist assisted nursing staff with transfer to another bed at end of session as air mattress function was limiting session. This patient will benefit from continued hydrotherapy for selective removal of unviable tissue, to decrease bioburden and promote wound bed healing. Wound Therapy - Functional Problem List: Global weakness Factors Delaying/Impairing Wound Healing: Immobility;Multiple medical problems Hydrotherapy Plan: Debridement;Dressing change;Patient/family education;Pulsatile lavage with suction Wound Therapy - Frequency: 6X / week Wound Therapy - Follow Up Recommendations: Skilled nursing facility Wound Plan: See above  Wound Therapy Goals- Improve the function of patient's integumentary system by progressing the wound(s) through the phases of wound healing (inflammation - proliferation - remodeling) by: Decrease Necrotic Tissue to: 20% Decrease Necrotic Tissue - Progress: Progressing toward goal Increase Granulation Tissue to: 80% Increase Granulation Tissue - Progress: Progressing toward goal Goals/treatment plan/discharge plan were made with and agreed upon by patient/family: Yes Time For Goal Achievement: 7 days Wound Therapy - Potential for Goals: Good  Goals will be updated until maximal potential achieved or discharge criteria met.  Discharge criteria: when goals achieved,  discharge from hospital, MD decision/surgical intervention, no progress towards goals, refusal/missing three consecutive treatments without notification or medical reason.  GP     Thelma Comp 11/28/2020, 12:02 PM  Rolinda Roan, PT, DPT Acute Rehabilitation Services Pager: [phone] Office: [phone]

## 2020-11-28 NOTE — Progress Notes (Signed)
Progress Note  Patient Name: Travis [name] Date of Encounter: 11/28/2020  [facility] Cardiologist: Glenetta Hew, MD   Subjective   Patient alert and intubated.  Denies complaints.  Patient remains intubated with intent to extubate today  Inpatient Medications    Scheduled Meds: . sodium chloride   Intravenous Once  . sodium chloride   Intravenous Once  . amiodarone  200 mg Per Tube BID  . atorvastatin  80 mg Per Tube q1800  . chlorhexidine  15 mL Mouth Rinse BID  . chlorhexidine gluconate (MEDLINE KIT)  15 mL Mouth Rinse BID  . Chlorhexidine Gluconate Cloth  6 each Topical Q0600  . Chlorhexidine Gluconate Cloth  6 each Topical Q0600  . [START ON 12/01/2020] collagenase   Topical Daily  . [START ON 12/02/2020] darbepoetin (ARANESP) injection - DIALYSIS  60 mcg Intravenous Q Sat-HD  . docusate  100 mg Per Tube BID  . doxercalciferol  5 mcg Intravenous Q M,W,F  . feeding supplement (PROSource TF)  90 mL Per Tube TID  . guaiFENesin  15 mL Per Tube Q4H  . heparin injection (subcutaneous)  5,000 Units Subcutaneous Q8H  . lactobacillus  1 g Per Tube TID WC  . mouth rinse  15 mL Mouth Rinse 10 times per day  . melatonin  5 mg Per Tube QHS  . metoprolol tartrate  5 mg Intravenous Q8H  . midodrine  20 mg Per Tube TID WC  . multivitamin  1 tablet Per Tube QHS  . pantoprazole sodium  40 mg Per Tube BID  . polyethylene glycol  17 g Per Tube Daily  . potassium chloride  20 mEq Oral Once  . QUEtiapine  100 mg Per Tube QHS  . sevelamer carbonate  1.6 g Per Tube TID WC  . sodium chloride flush  10 mL Intracatheter Q8H  . sodium chloride flush  10-40 mL Intracatheter Q12H  . sodium chloride flush  3 mL Intravenous Q12H  . sodium hypochlorite   Topical BID  . sucralfate  1 g Per Tube QID  . ticagrelor  90 mg Per Tube BID   Continuous Infusions: . sodium chloride 10 mL/hr (11/21/20 1722)  . sodium chloride 250 mL (11/27/20 0902)  .  ceFAZolin (ANCEF) IV Stopped (11/27/20  1452)  . dextrose 40 mL/hr at 11/28/20 0700  . feeding supplement (VITAL 1.5 CAL) 1,000 mL (11/27/20 1655)  . propofol (DIPRIVAN) infusion Stopped (11/28/20 0539)   PRN Meds: sodium chloride, acetaminophen, fentaNYL (SUBLIMAZE) injection, fentaNYL (SUBLIMAZE) injection, heparin, LORazepam, ondansetron (ZOFRAN) IV, Resource ThickenUp Clear, senna, silver nitrate applicators, sodium chloride, sodium chloride flush, sodium chloride flush   Vital Signs    Vitals:   11/28/20 0600 11/28/20 0630 11/28/20 0700 11/28/20 0730  BP: (!) 104/59 118/73 112/67 119/72  Pulse: 82 87 83 84  Resp: 18 18 (!) 21 (!) 9  Temp:      TempSrc:      SpO2: 100% 95% 97% 99%  Weight:      Height:        Intake/Output Summary (Last 24 hours) at 11/28/2020 0748 Last data filed at 11/28/2020 0700 Gross per 24 hour  Intake 1170.03 ml  Output 2300 ml  Net -1129.97 ml   Last 3 Weights 11/25/2020 11/25/2020 11/24/2020  Weight (lbs) 176 lb 2.4 oz 189 lb 2.5 oz 190 lb 11.2 oz  Weight (kg) 79.9 kg 85.8 kg 86.5 kg      Telemetry    Sinus rhythm- Personally Reviewed  ECG    Normal sinus rhythm at 87 with septal Q waves-personally Reviewed  Physical Exam   GEN: No acute distress.  Alert, intubated Neck: No JVD Cardiac: RRR, no murmurs, rubs, or gallops.  Respiratory: Clear to auscultation bilaterally. GI: Soft, nontender, non-distended  MS: No edema; No deformity. Neuro:  Nonfocal  Psych: Normal affect   Labs    High Sensitivity Troponin:   Recent Labs  Lab 10/29/20 2121 10/30/20 0548 11/26/20 0818 11/26/20 0935  TROPONINIHS >27,000* >27,000* 287* 750*      Chemistry Recent Labs  Lab 11/22/20 0444 11/23/20 0425 11/25/20 0103 11/26/20 0043 11/26/20 1827 11/27/20 0518 11/28/20 0504  NA 130* 130*   < > 135 137 134* 134*  K 4.6 5.1   < > 4.2 3.9 4.0 2.6*  CL 93* 91*   < > 96*  --  96* 94*  CO2 25 23   < > 30  --  28 28  GLUCOSE 90 84   < > 82  --  84 81  BUN 76* 95*   < > 40*  --   29* 24*  CREATININE 8.08* 9.78*   < > 6.10*  --  5.81* 5.25*  CALCIUM 9.0 8.9   < > 9.1  --  9.6 9.2  PROT 5.1* 4.9*  --   --   --   --   --   ALBUMIN 1.2*  1.2* 1.2*  --   --   --   --  1.7*  AST 18 17  --   --   --   --   --   ALT 6 <5  --   --   --   --   --   ALKPHOS 98 99  --   --   --   --   --   BILITOT 1.0 0.9  --   --   --   --   --   GFRNONAA 7* 6*   < > 10*  --  11* 12*  ANIONGAP 12 16*   < > 9  --  10 12   < > = values in this interval not displayed.     Hematology Recent Labs  Lab 11/25/20 0103 11/25/20 1725 11/26/20 0043 11/26/20 1827 11/27/20 1033 11/28/20 0504  WBC 14.8*  --   --   --  17.4* 15.9*  RBC 2.37*  --  2.70*  --  2.35* 2.50*  HGB 6.7*   < >  --  9.5* 6.9* 7.4*  HCT 21.5*   < >  --  28.0* 21.6* 22.9*  MCV 90.7  --   --   --  91.9 91.6  MCH 28.3  --   --   --  29.4 29.6  MCHC 31.2  --   --   --  31.9 32.3  RDW 14.2  --   --   --  14.6 14.6  PLT 488*  --   --   --  446* 371   < > = values in this interval not displayed.    BNPNo results for input(s): BNP, PROBNP in the last 168 hours.   DDimer No results for input(s): DDIMER in the last 168 hours.   Radiology    DG Abd 1 View  Result Date: 11/26/2020 CLINICAL DATA:  Enteric tube placement EXAM: ABDOMEN - 1 VIEW COMPARISON:  November 09, 2020 FINDINGS: The tip of the enteric tube projects over the gastric body. The tip is  pointed distally. Hazy bilateral airspace opacities are noted at the lung bases. There is a probable right-sided pleural effusion. IMPRESSION: Enteric tube projects over the gastric body. Electronically Signed   By: Constance Holster M.D.   On: 11/26/2020 18:43   CT CHEST WO CONTRAST  Result Date: 11/26/2020 CLINICAL DATA:  Respiratory failure. EXAM: CT CHEST WITHOUT CONTRAST TECHNIQUE: Multidetector CT imaging of the chest was performed following the standard protocol without IV contrast. COMPARISON:  CT chest dated November 18, 2020. FINDINGS: Cardiovascular: The heart size  is mildly enlarged. There is a small pericardial effusion. There is a probable ascending thoracic aortic aneurysm measuring up to 4.4 cm. However this is suboptimally evaluated secondary to pulsation artifact. Advanced coronary artery calcifications are noted. Mediastinum/Nodes: -- No mediastinal lymphadenopathy. -- No hilar lymphadenopathy. -- No axillary lymphadenopathy. -- No supraclavicular lymphadenopathy. --there is a left-sided thyroid nodule measuring approximately 2.3 cm. -  Unremarkable esophagus. Lungs/Pleura: Diffuse perihilar ground-glass airspace opacities and consolidation are noted. This is new since the patient's prior study dated 11/18/2020. The endotracheal tube terminates above the carina. There is no pneumothorax. There is interlobular septal thickening. There is dense consolidation at the left lung base. There is a persistent mass in the peripheral right middle lobe measuring approximately 4.3 cm. Portions of this mass suggests underlying cavitation. There are bilateral pleural effusions that appear to be moderate size but are not well evaluated secondary to extensive streak artifact and lack of IV contrast. Upper Abdomen: There is no acute abnormality in the upper abdomen. The tip of the enteric tube is not visualized on this study. Musculoskeletal: No chest wall abnormality. No bony spinal canal stenosis. IMPRESSION: 1. Lines and tubes as above. 2. Diffuse perihilar ground-glass airspace opacities and consolidation with an upper lobe predominance. This is favored to be secondary to pulmonary edema or developing ARDS. An atypical infectious process is not excluded. 3. Bibasilar consolidation and bilateral pleural effusions are noted. 4. Persistent mass in the right middle lobe with underlying cavitation follow-up to radiologic resolution is recommended. 5. Cardiomegaly. 6. Ascending thoracic aortic aneurysm that appears to be measuring up to approximately 4.3 cm but is suboptimally evaluated  secondary to pulsation artifact. Recommend annual imaging followup by CTA or MRA. This recommendation follows 2010 ACCF/AHA/AATS/ACR/ASA/SCA/SCAI/SIR/STS/SVM Guidelines for the Diagnosis and Management of Patients with Thoracic Aortic Disease. Circulation. 2010; 121: B846-K599. Aortic aneurysm NOS (ICD10-I71.9) 7. No pneumothorax.  No displaced rib fracture. 8. Left-sided thyroid nodule measuring approximately 2.3 cm. Recommend thyroid US as an outpatient(ref: J Am Coll Radiol. 2015 Feb;12(2): 143-50). Aortic Atherosclerosis (ICD10-I70.0). Electronically Signed   By: Constance Holster M.D.   On: 11/26/2020 22:28   DG CHEST PORT 1 VIEW  Result Date: 11/26/2020 CLINICAL DATA:  Short of breath, abnormal chest x-ray, intubated EXAM: PORTABLE CHEST 1 VIEW COMPARISON:  11/26/2020 at 5:35 p.m. FINDINGS: Single frontal view of the chest demonstrates endotracheal tube overlying tracheal air column, tip just below thoracic inlet. Enteric catheter passes below diaphragm, side port projecting at the gastroesophageal junction. The tip is excluded by collimation. Cardiac silhouette remains enlarged. Bilateral upper lobe predominant perihilar airspace disease and right pleural effusion are unchanged. No pneumothorax. IMPRESSION: 1. Support devices as above. 2. Stable pulmonary edema and right pleural effusion. Electronically Signed   By: Randa Ngo M.D.   On: 11/26/2020 21:00   DG CHEST PORT 1 VIEW  Result Date: 11/26/2020 CLINICAL DATA:  Endotracheal tube placement EXAM: PORTABLE CHEST 1 VIEW COMPARISON:  X-ray earlier in the same  day. FINDINGS: The endotracheal tube terminates above the carina by approximately 6.6 cm. Diffuse hazy bilateral airspace opacities are again noted, worse in the upper lobes. The heart size remains enlarged. There are bilateral pleural effusions, right greater than left. There is no pneumothorax. IMPRESSION: 1. Lines and tubes as above. 2. Persistent bilateral pleural effusions with  persistent multifocal airspace opacities, improved from earlier the same day. Electronically Signed   By: Constance Holster M.D.   On: 11/26/2020 18:45   DG CHEST PORT 1 VIEW  Result Date: 11/26/2020 CLINICAL DATA:  Acute onset shortness of breath. EXAM: PORTABLE CHEST 1 VIEW COMPARISON:  Single-view of the chest 11/24/2020 and 11/26/2020. FINDINGS: Extensive bilateral airspace disease is not notably changed in appearance compared to yesterday's examination. There are likely small to moderate bilateral pleural effusions. Cardiomegaly. Aortic atherosclerosis. No pneumothorax. IMPRESSION: No marked change in extensive bilateral airspace disease and likely pleural effusions. Findings could be due to multifocal pneumonia and/or congestive failure. Cardiomegaly. Electronically Signed   By: Inge Rise M.D.   On: 11/26/2020 14:55   ECHOCARDIOGRAM LIMITED  Result Date: 11/26/2020    ECHOCARDIOGRAM LIMITED REPORT   Patient Name:   Travis [name] Date of Exam: 11/26/2020 Medical Rec #:  [id number]         Height:       71.0 in Accession #:    [id number]        Weight:       176.1 lb Date of Birth:  08-Jun-1961        BSA:          1.998 m Patient Age:    23 years          BP:           113/72 mmHg Patient Gender: M                 HR:           107 bpm. Exam Location:  Inpatient Procedure: 2D Echo STAT ECHO Indications:     abnormal heart sounds  History:         Patient has prior history of Echocardiogram examinations, most                  recent 11/13/2020. CAD, end stage renal disease., mitral                  regurgitation, Arrythmias:Atrial Fibrillation; Risk                  Factors:Dyslipidemia and Hypertension.  Sonographer:     Johny Chess Referring Phys:  [place] Diagnosing Phys: Eleonore Chiquito MD IMPRESSIONS  1. Left ventricular ejection fraction, by estimation, is 45 to 50%. The left ventricle has mildly decreased function. The left ventricle demonstrates regional wall motion  abnormalities (see scoring diagram/findings for description). There is moderate concentric left ventricular hypertrophy. Indeterminate diastolic filling due to E-A fusion.  2. Right ventricular systolic function is normal. The right ventricular size is normal. Tricuspid regurgitation signal is inadequate for assessing PA pressure.  3. There is mild to moderate MR due to restricted PMVL due to mitral annular calcification (IIIB). MR is slightly worse than the prior study, but does not appear to be severe. The mitral valve is grossly normal. Mild to moderate mitral valve regurgitation. No evidence of mitral stenosis. Moderate mitral annular calcification.  4. The aortic valve is tricuspid. There is mild calcification of the aortic valve. There  is mild thickening of the aortic valve. Aortic valve regurgitation is trivial. Mild to moderate aortic valve sclerosis/calcification is present, without any evidence of aortic stenosis.  5. The inferior vena cava is dilated in size with >50% respiratory variability, suggesting right atrial pressure of 8 mmHg. Comparison(s): No significant change from prior study. FINDINGS  Left Ventricle: Left ventricular ejection fraction, by estimation, is 45 to 50%. The left ventricle has mildly decreased function. The left ventricle demonstrates regional wall motion abnormalities. The left ventricular internal cavity size was normal in size. There is moderate concentric left ventricular hypertrophy. Indeterminate diastolic filling due to E-A fusion.  LV Wall Scoring: The mid inferoseptal segment, apical septal segment, and apex are hypokinetic. Right Ventricle: The right ventricular size is normal. No increase in right ventricular wall thickness. Right ventricular systolic function is normal. Tricuspid regurgitation signal is inadequate for assessing PA pressure. Mitral Valve: There is mild to moderate MR due to restricted PMVL due to mitral annular calcification (IIIB). MR is slightly worse  than the prior study, but does not appear to be severe. The mitral valve is grossly normal. Moderate mitral annular calcification. Mild to moderate mitral valve regurgitation, with posteriorly-directed jet. No evidence of mitral valve stenosis. Tricuspid Valve: The tricuspid valve is grossly normal. Tricuspid valve regurgitation is trivial. No evidence of tricuspid stenosis. Aortic Valve: The aortic valve is tricuspid. There is mild calcification of the aortic valve. There is mild thickening of the aortic valve. Aortic valve regurgitation is trivial. Mild to moderate aortic valve sclerosis/calcification is present, without any evidence of aortic stenosis. Aortic valve mean gradient measures 12.7 mmHg. Aortic valve peak gradient measures 22.8 mmHg. Aortic valve area, by VTI measures 2.82 cm. Pulmonic Valve: The pulmonic valve was grossly normal. Pulmonic valve regurgitation is not visualized. No evidence of pulmonic stenosis. Aorta: The aortic root and ascending aorta are structurally normal, with no evidence of dilitation. Venous: The inferior vena cava is dilated in size with greater than 50% respiratory variability, suggesting right atrial pressure of 8 mmHg. Additional Comments: There is a small pleural effusion in the left lateral region. LEFT VENTRICLE PLAX 2D LVIDd:         5.80 cm LVIDs:         4.60 cm LV PW:         1.50 cm LV IVS:        1.30 cm LVOT diam:     2.20 cm LV SV:         109 LV SV Index:   55 LVOT Area:     3.80 cm  LV Volumes (MOD) LV vol d, MOD A4C: 169.0 ml LV vol s, MOD A4C: 104.0 ml LV SV MOD A4C:     169.0 ml IVC IVC diam: 2.30 cm LEFT ATRIUM         Index LA diam:    5.50 cm 2.75 cm/m  AORTIC VALVE AV Area (Vmax):    2.42 cm AV Area (Vmean):   2.50 cm AV Area (VTI):     2.82 cm AV Vmax:           238.90 cm/s AV Vmean:          168.751 cm/s AV VTI:            0.388 m AV Peak Grad:      22.8 mmHg AV Mean Grad:      12.7 mmHg LVOT Vmax:         152.00 cm/s LVOT Vmean:  111.000  cm/s LVOT VTI:          0.288 m LVOT/AV VTI ratio: 0.74  AORTA Ao Root diam: 3.50 cm Ao Asc diam:  3.80 cm  SHUNTS Systemic VTI:  0.29 m Systemic Diam: 2.20 cm Eleonore Chiquito MD Electronically signed by Eleonore Chiquito MD Signature Date/Time: 11/26/2020/12:08:55 PM    Final (Updated)     Cardiac Studies   2D echocardiogram (11/26/2020)  IMPRESSIONS    1. Left ventricular ejection fraction, by estimation, is 45 to 50%. The  left ventricle has mildly decreased function. The left ventricle  demonstrates regional wall motion abnormalities (see scoring  diagram/findings for description). There is moderate  concentric left ventricular hypertrophy. Indeterminate diastolic filling  due to E-A fusion.  2. Right ventricular systolic function is normal. The right ventricular  size is normal. Tricuspid regurgitation signal is inadequate for assessing  PA pressure.  3. There is mild to moderate MR due to restricted PMVL due to mitral  annular calcification (IIIB). MR is slightly worse than the prior study,  but does not appear to be severe. The mitral valve is grossly normal. Mild  to moderate mitral valve  regurgitation. No evidence of mitral stenosis. Moderate mitral annular  calcification.  4. The aortic valve is tricuspid. There is mild calcification of the  aortic valve. There is mild thickening of the aortic valve. Aortic valve  regurgitation is trivial. Mild to moderate aortic valve  sclerosis/calcification is present, without any  evidence of aortic stenosis.  5. The inferior vena cava is dilated in size with >50% respiratory  variability, suggesting right atrial pressure of 8 mmHg.   Comparison(s): No significant change from prior study.   Cardiac catheterization/PCI stent (10/28/2020)  Conclusion    There is mild to moderate left ventricular systolic dysfunction. The left ventricular ejection fraction is 40-45% by visual estimate - > anterior-anterolateral apical  hypokinesis  LV end diastolic pressure is severely elevated -> on recheck post PCI, EDP 30 mmHg  Mid LAD lesion is 99% stenosed. 1st Diag lesion is 55% stenosed just proximal to the lesion  A drug-eluting stent was successfully placed crossing the diagonal branch, using a Chewey [id number]. Postdilated to 3.1 mm  Post intervention, there is a 0% residual stenosis.  LPAV lesion is 99% stenosed -> ostial lesion (LCx is 90  turn followed by another 90  turn into the AVG)  Prox RCA lesion is 80% stenosed -> concentric napkin ring calcified lesion.   SUMMARY  Acute anterolateral ST elevation MI with Culprit lesion being the 99% mid LAD lesion (just past major 1st Diag/D1) along with multivessel CAD including 99% ostial AV groove LCx and 80% calcified napkin ring proximal RCA lesion with extensive calcification throughout the RCA. ? Successful PTCA and DES PCI of the LAD crossing D1 -resolute Onyx DES 2.75 mm x 18 mm postdilated to 3.1 mm. ? With PCI, there was stabilization of significant ectopy, AIVR and PVCs  Mild to moderate reduced EF with anterior anterolateral hypokinesis, EF roughly 40 to 45%.  Initial evaluation suggested normal EDP, but on recheck post PCI LVEDP of 30 mmHg, severely elevated consistent with ACUTE DIASTOLIC HEART FAILURE  Significantly dilated aortic root requiring AL-1 guide catheter for both Left and Right Coronary Angiography.   RECOMMENDATIONS  Admit to CCU, restart IV heparin 8 hours after sheath removal  Check 2D echo for better assessment of EF  Plan to review cath films with other IC cardiologist, anticipate staged PCI of at  least the RCA which may require atherectomy versus lithotripsy, and likely the ostial AV groove circumflex.  If he has signs of dyspnea, consider earlier dialysis than tomorrow. ->  Would consult for nephrology to see today.  He is already on labetalol and Norvasc.  We will continue current home  medications.  Uninterrupted DAPT times X 1 year. Coronary Diagrams   Diagnostic Dominance: Right    Intervention       Patient Profile     Travis [name] is a 59 y.o. male with ESRD, hypertension, tobacco abuse, CAD who was admitted on 10/28/2020 for anterior wall STEMI.  He has residual RCA disease.  Course complicated by A. fib with RVR.  Course also complicated by MSSA bacteremia with shock, upper GI bleeding secondary to severe esophagitis/duodenal ulcer.  Course has also been complicated by bilateral lung abscesses and empyema requiring chest tubes.  He has been to the ICU twice.  Once for his anterior wall STEMI.  And then back for respiratory failure and chest tube insertion.  He was transferred out of the ICU on 11/25/2020.  On 11/26/2020 he developed acute hypoxic respiratory failure that is ongoing.  Assessment & Plan    1: Acute hypoxic respiratory failure-patient was ambulating in the hallway up until this weekend when he had acute respiratory decompensation.  He is placed on BiPAP and then intubated.  His chest x-ray showed progressive infiltrates.  2D echo showed no changes with mild to moderate MR and an EF of 45%.  He was dialyzed twice for eval volume overload.  He has good saturations nail at 95%.  He was in A. fib and currently is back to sinus rhythm.  Ventilator management per PCCM.  Plan was for extubation yesterday after dialysis which I suspect will happen today.  He is down over 6 L.  He does have patchy infiltrate on chest x-ray but appears somewhat improved.  2: PAF-currently in in sinus rhythm/sinus tach on p.o. amiodarone.  I believe this is driven by his pulmonary decompensation.  He is not a candidate for anticoagulation at this time given his prior GI bleed.  3: CAD-anterior STEMI 10/28/2020 treated with PCI and stenting of his mid LAD by Dr. [name].  He did have residual RCA and ostial nondominant circumflex disease.  He is on Brilinta.  He is  cardiovascular stable.  Unfortunately, he cannot come off his antiplatelet therapy.  Plan was for staged RCA intervention once he improves.  Is currently is on hold.  4: Ischemic cardiomyopathy-EF by limited 2D echo yesterday was stable at 40 to 45%.  He is on beta-blocker.  He is a dialysis patient.  We will hold off on optimal medical therapy until he is more stable.  5: Mitral regurgitation-to moderate by 2D echo yesterday, unchanged  6: Anemia-hemoglobin down from 9.5-7.4 this morning.Marland Kitchen  He does have a history of GI bleed.  He was transfused during hemodialysis.  I suspect he will need another unit of blood transfusion during dialysis  7: Chronic renal insufficiency-on renal replacement therapy, followed by the nephrology service  8: Infectious disease-pneumonia/empyema.  He also has a large sacral decubitus ulcer.  On broad-spectrum antibiotics  Cardiovascular stable.  Plans apparently are for attempted extubation today.  We will see again on Thursday.  For questions or updates, please contact [place] Please consult [email or web address] for contact info under        Signed, Quay Burow, MD  11/28/2020, 7:48 AM

## 2020-11-28 NOTE — Progress Notes (Signed)
[facility] Progress Note Patient Name: Travis [name] DOB: 1961/07/27 MRN: [id number]   Date of Service  11/28/2020  HPI/Events of Note  Anxiety - Patient currently on Precedex IV infusion at 1.0 mcg/kg/hour and Propofol IV infusion at 20 mcg/kg/min  eICU Interventions  Plan: 1. Increase ceiling on Precedex IV infusion to 1.6 mcg/kg/min. Titrate to RASS = 0.  2. Wean Propofol off as tolerated.         Aldeen Riga Cornelia Copa 11/28/2020, 8:38 PM

## 2020-11-28 NOTE — Progress Notes (Signed)
Pt expressed his concerns to Dr. Ruthann Cancer that after fentanyl was given earlier he felt like he stopped breathing and or couldn't breath which caused him to fail his SBT. Emotional support provided. Fentanyl discontinued at this time.

## 2020-11-28 NOTE — Progress Notes (Signed)
SLP Cancellation Note  Patient Details Name: Travis [name] MRN: [id number] DOB: Jul 23, 1961   Cancelled treatment:       Reason Eval/Treat Not Completed: Medical issues which prohibited therapy (remains on vent, although per MD, plans may be to extubate today). Will continue to follow.    Osie Bond., M.A. [facility] Pager [phone] Office [phone]  11/28/2020, 8:23 AM

## 2020-11-28 NOTE — Progress Notes (Signed)
Late Entry   PCCM Progress note   On evening round patient was seen with decreased vent setting and tolerating SBT. However he appeared more lethargic after completing bedside dialysis. This Probation officer discussed with wife and patient that it would be best to wait until he was a bit more awake and stronger prior to extubation. His wife and I agreed that we could wait until the mooring to start tube feeds as there was a good chance the patient would be extubated. Patients nurse was updated regarding plan to hold tube feeding.   Johnsie Cancel, NP-C [facility] Contact / Pager information can be found on Amion  11/28/2020, 4:25 PM

## 2020-11-28 NOTE — Procedures (Signed)
Cortrak  Person Inserting Tube:  Travis [name], RD Tube Type:  Cortrak - 43 inches Tube Location:  Right nare Initial Placement:  Stomach Secured by: Bridle Technique Used to Measure Tube Placement:  Documented cm marking at nare/ corner of mouth Cortrak Secured At:  70 cm    Cortrak Tube Team Note:  Consult received to place a Cortrak feeding tube.   No x-ray is required. RN may begin using tube.    If the tube becomes dislodged please keep the tube and contact the Cortrak team at [email or web address] (password [email or web address]) for replacement.  If after hours and replacement cannot be delayed, place a NG tube and confirm placement with an abdominal x-ray.    Travis Bankert, MS, RD, LDN RD pager number and weekend/on-call pager number located in Amion.   

## 2020-11-28 NOTE — Progress Notes (Signed)
RT called to bedside, pt breathing about 40-50 times a min, panicking reports he cant breath. Pt suctioned and emotional support provided this was not effective, RT changed pt back to [facility]

## 2020-11-29 ENCOUNTER — Inpatient Hospital Stay (HOSPITAL_COMMUNITY): Payer: Medicare Other

## 2020-11-29 LAB — CBC
HCT: 27.3 % — ABNORMAL LOW (ref 39.0–52.0)
Hemoglobin: 8.8 g/dL — ABNORMAL LOW (ref 13.0–17.0)
MCH: 29.6 pg (ref 26.0–34.0)
MCHC: 32.2 g/dL (ref 30.0–36.0)
MCV: 91.9 fL (ref 80.0–100.0)
Platelets: 429 10*3/uL — ABNORMAL HIGH (ref 150–400)
RBC: 2.97 MIL/uL — ABNORMAL LOW (ref 4.22–5.81)
RDW: 14.6 % (ref 11.5–15.5)
WBC: 22.2 10*3/uL — ABNORMAL HIGH (ref 4.0–10.5)
nRBC: 0 % (ref 0.0–0.2)

## 2020-11-29 LAB — RENAL FUNCTION PANEL
Albumin: 1.6 g/dL — ABNORMAL LOW (ref 3.5–5.0)
Anion gap: 14 (ref 5–15)
BUN: 40 mg/dL — ABNORMAL HIGH (ref 6–20)
CO2: 25 mmol/L (ref 22–32)
Calcium: 9.1 mg/dL (ref 8.9–10.3)
Chloride: 95 mmol/L — ABNORMAL LOW (ref 98–111)
Creatinine, Ser: 6.88 mg/dL — ABNORMAL HIGH (ref 0.61–1.24)
GFR, Estimated: 9 mL/min — ABNORMAL LOW (ref >60)
Glucose, Bld: 128 mg/dL — ABNORMAL HIGH (ref 70–99)
Phosphorus: 4.3 mg/dL (ref 2.5–4.6)
Potassium: 3.8 mmol/L (ref 3.5–5.1)
Sodium: 134 mmol/L — ABNORMAL LOW (ref 135–145)

## 2020-11-29 LAB — CULTURE, RESPIRATORY W GRAM STAIN
Culture: NORMAL
Gram Stain: NONE SEEN

## 2020-11-29 LAB — GLUCOSE, CAPILLARY
Glucose-Capillary: 108 mg/dL — ABNORMAL HIGH (ref 70–99)
Glucose-Capillary: 110 mg/dL — ABNORMAL HIGH (ref 70–99)
Glucose-Capillary: 114 mg/dL — ABNORMAL HIGH (ref 70–99)
Glucose-Capillary: 118 mg/dL — ABNORMAL HIGH (ref 70–99)
Glucose-Capillary: 88 mg/dL (ref 70–99)
Glucose-Capillary: 89 mg/dL (ref 70–99)

## 2020-11-29 LAB — TRIGLYCERIDES: Triglycerides: 77 mg/dL (ref <150)

## 2020-11-29 MED ORDER — DOXERCALCIFEROL 4 MCG/2ML IV SOLN
INTRAVENOUS | Status: AC
  Administered 2020-11-29: 11:00:00 5 ug via INTRAVENOUS
  Filled 2020-11-29: qty 4

## 2020-11-29 MED ORDER — OXYCODONE HCL 5 MG/5ML PO SOLN: 5.0000 mg | Freq: Three times a day (TID) | ORAL | Status: DC | PRN

## 2020-11-29 MED ORDER — VITAL 1.5 CAL PO LIQD
1000.0000 mL | ORAL | Status: DC
  Administered 2020-11-29 – 2020-11-30 (×2): 1000 mL

## 2020-11-29 MED ORDER — PHENYLEPHRINE HCL-NACL 10-0.9 MG/250ML-% IV SOLN: 25.0000 ug/min | INTRAVENOUS | Status: DC

## 2020-11-29 MED ORDER — PHENYLEPHRINE HCL-NACL 10-0.9 MG/250ML-% IV SOLN
INTRAVENOUS | Status: AC
  Filled 2020-11-29: qty 250

## 2020-11-29 MED ORDER — ALBUMIN HUMAN 25 % IV SOLN
INTRAVENOUS | Status: AC
  Administered 2020-11-29: 07:00:00 25 g via INTRAVENOUS
  Filled 2020-11-29: qty 100

## 2020-11-29 MED ORDER — ALBUMIN HUMAN 25 % IV SOLN: 25.0000 g | Freq: Once | INTRAVENOUS | Status: AC | PRN

## 2020-11-29 MED ORDER — JUVEN PO PACK
1.0000 | PACK | Freq: Two times a day (BID) | ORAL | Status: DC
  Administered 2020-11-29: 14:00:00 1
  Filled 2020-11-29: qty 1

## 2020-11-29 MED ORDER — BACID PO TABS
1.0000 | ORAL_TABLET | Freq: Three times a day (TID) | ORAL | Status: DC
  Administered 2020-11-29 (×2): 1
  Filled 2020-11-29 (×3): qty 1

## 2020-11-29 MED ORDER — ALBUMIN HUMAN 25 % IV SOLN
INTRAVENOUS | Status: AC
  Filled 2020-11-29: qty 100

## 2020-11-29 MED ORDER — SODIUM CHLORIDE 0.9 % IV SOLN: 250.0000 mL | INTRAVENOUS | Status: DC

## 2020-11-29 MED ORDER — PROSOURCE TF PO LIQD
90.0000 mL | Freq: Four times a day (QID) | ORAL | Status: DC
  Administered 2020-11-29 – 2020-12-04 (×23): 90 mL
  Filled 2020-11-29 (×23): qty 90

## 2020-11-29 MED ORDER — ALBUMIN HUMAN 25 % IV SOLN: 25.0000 g | Freq: Once | INTRAVENOUS | Status: AC

## 2020-11-29 NOTE — Progress Notes (Signed)
EKG CRITICAL VALUE     12 lead EKG performed.  Critical value noted.  paiton, RN notified.   Genia Plants, CCT 11/29/2020 7:13 AM

## 2020-11-29 NOTE — Plan of Care (Signed)
  Problem: Cardiovascular: Goal: Ability to achieve and maintain adequate cardiovascular perfusion will improve Outcome: Progressing   Problem: Education: Goal: Knowledge of General Education information will improve Description: Including pain rating scale, medication(s)/side effects and non-pharmacologic comfort measures Outcome: Progressing   Problem: Clinical Measurements: Goal: Will remain free from infection Outcome: Progressing

## 2020-11-29 NOTE — Progress Notes (Signed)
Physical Therapy Wound Treatment Patient Details  Name: Travis [name] MRN: [id number] Date of Birth: Apr 29, 1961  Today's Date: 11/29/2020 Time: 3716-9678 Time Calculation (min): 23 min  Subjective  Subjective: Pt pressing call bell, trying to turn on TV Patient and Family Stated Goals: None stated Prior Treatments: Dressing changes  Pain Score: 10/10 with pulsatile lavage   Wound Assessment  Pressure Injury 11/14/20 Buttocks Mid;Right;Left Unstageable - Full thickness tissue loss in which the base of the injury is covered by slough (yellow, tan, gray, green or brown) and/or eschar (tan, brown or black) in the wound bed. (Active)  Dressing Type ABD;Barrier Film (skin prep);Gauze (Comment) 11/29/20 1516  Dressing Changed;Clean;Dry;Intact 11/29/20 1516  Dressing Change Frequency Daily 11/29/20 1516  State of Healing Early/partial granulation 11/29/20 1516  Site / Wound Assessment Red;Pink;Yellow 11/29/20 1516  % Wound base Red or Granulating 40% 11/28/20 1157  % Wound base Yellow/Fibrinous Exudate 50% 11/28/20 1157  % Wound base Black/Eschar 10% 11/28/20 1157  % Wound base Other/Granulation Tissue (Comment) 0% 11/28/20 1157  Peri-wound Assessment Intact;Maceration 11/29/20 1516  Wound Length (cm) 10 cm 11/28/20 1000  Wound Width (cm) 15 cm 11/28/20 1000  Wound Depth (cm) 2 cm 11/28/20 1000  Wound Surface Area (cm^2) 150 cm^2 11/28/20 1000  Wound Volume (cm^3) 300 cm^3 11/28/20 1000  Tunneling (cm) 0 11/22/20 1401  Undermining (cm) 1.2 cm at 12:00 11/28/20 1000  Margins Unattached edges (unapproximated) 11/29/20 1516  Drainage Amount Minimal 11/29/20 1516  Drainage Description Serosanguineous 11/29/20 1516  Treatment Debridement (Selective);Hydrotherapy (Pulse lavage);Packing (Saline gauze) 11/29/20 1516     Hydrotherapy Pulsed lavage therapy - wound location: Buttocks Pulsed Lavage with Suction (psi): 4 psi (4-8 due to pain) Pulsed Lavage with Suction - Normal Saline  Used: 1000 mL Pulsed Lavage Tip: Tip with splash shield Selective Debridement Selective Debridement - Location: Buttocks Selective Debridement - Tools Used: Forceps;Scalpel Selective Debridement - Tissue Removed: brown and yellow unviable tissue   Wound Assessment and Plan  Wound Therapy - Assess/Plan/Recommendations Wound Therapy - Clinical Statement: Wound bed appearance continues to improve. Discussed with RN/MD regarding pain medication regimen as he has none currentl on board for hydrotherapy. This patient will benefit from continued hydrotherapy for selective removal of unviable tissue, to decrease bioburden and promote wound bed healing. Wound Therapy - Functional Problem List: Global weakness Factors Delaying/Impairing Wound Healing: Immobility;Multiple medical problems Hydrotherapy Plan: Debridement;Dressing change;Patient/family education;Pulsatile lavage with suction Wound Therapy - Frequency: 6X / week Wound Therapy - Follow Up Recommendations: Skilled nursing facility Wound Plan: See above  Wound Therapy Goals- Improve the function of patient's integumentary system by progressing the wound(s) through the phases of wound healing (inflammation - proliferation - remodeling) by: Decrease Necrotic Tissue to: 20% Decrease Necrotic Tissue - Progress: Progressing toward goal Increase Granulation Tissue to: 80% Increase Granulation Tissue - Progress: Progressing toward goal Goals/treatment plan/discharge plan were made with and agreed upon by patient/family: Yes Time For Goal Achievement: 7 days Wound Therapy - Potential for Goals: Good  Goals will be updated until maximal potential achieved or discharge criteria met.  Discharge criteria: when goals achieved, discharge from hospital, MD decision/surgical intervention, no progress towards goals, refusal/missing three consecutive treatments without notification or medical reason.  GP    Wyona Almas, PT, DPT Acute Rehabilitation  Services Pager [phone] Office [phone]   Deno Etienne 11/29/2020, 3:20 PM

## 2020-11-29 NOTE — Procedures (Signed)
Extubation Procedure Note  Patient Details:   Name: Travis [name] DOB: Jul 22, 1961 MRN: [id number]   Airway Documentation:    Vent end date: 11/29/20 Vent end time: 1214   Evaluation  O2 sats: stable throughout Complications: No apparent complications Patient did tolerate procedure well. Bilateral Breath Sounds: Rhonchi   Yes   Positive cuff leak heard prior to extubation.  Esperanza Sheets T 11/29/2020, 12:19 PM

## 2020-11-29 NOTE — Progress Notes (Signed)
Contacted [facility] 12/22 at [place]. Patient's BP still low with MAP of 55-60. Awaiting contact from provider.

## 2020-11-29 NOTE — Progress Notes (Signed)
Unable to start neo until IV team sees patient. Neither current IV has blood return for vasoactive infusion.

## 2020-11-29 NOTE — Progress Notes (Signed)
eLink Physician-Brief Progress Note Patient Name: Travis [name] DOB: 03-14-1961 MRN: [id number]   Date of Service  11/29/2020  HPI/Events of Note  Hypotension - BP = 70/48 with MAP = 57. Patient has ESRD on HD.  eICU Interventions  Plan: 1. Phenylephrine IV infusion via PIV. Titrate to MAP >= 65.     Intervention Category Major Interventions: Hypotension - evaluation and management  Aava Deland Eugene 11/29/2020, 2:22 AM

## 2020-11-29 NOTE — Progress Notes (Signed)
OT Cancellation Note  Patient Details Name: Travis [name] MRN: [id number] DOB: 02-14-1961   Cancelled Treatment:    Reason Eval/Treat Not Completed: Patient at procedure or test/ unavailable.  Patient being extubated.  OT to continue efforts as appropriate.    Makeya Hilgert D Cadence Minton 11/29/2020, 12:19 PM

## 2020-11-29 NOTE — Progress Notes (Signed)
[facility] Progress Note   Assessment/ Plan:   OP HD:AF MWF 4h 450/800 81.5kg 2/2.25 bath RUE AVF Hep5000+1026md-run - hect 5 ug tiw  - mircera 50 ugIVq 4 weeks, last 11/15 (due 11/29)  Assessment/ Plan: 1. Acute hypoxic RF: had HD Sunday without improvement and was intubated--> appeared to have frothy pink secretions in ETT.  HD 12/20 with increased UF goal --> on max midodrine + albumin helped.  Next HD 12/22, max UF, using albumin and midodrine to improve UF. 2. Acute STEMI- S/P LAD PCI11/20, has otherdisease of LCx/RCA.Transitioned to brilinta. Per cardiology. 3. R parapneumonic effusion: Chest tube placed then dislodged, replaced again 12/14.  Per CCM. 4. MSSA cavitary PNA / bacteremia- sp vent support. Blood cx+. Getting 6 wks IV Ancef per pharm ending 12/30/20. F/U bcx's negative. [place] neg for abscess. 5. Hypotension - on max dose midodrine (20 TID) 6. ESRD - usual HD MWF.  Required CRRT from 11/24-12/4.  Tolerating iHD. Unable to use heparin because of GI bleed/anemia. HD today on schedule 7. HD access - fistulogram done 12/13 by IR here, treated 2 outflow stenoses w/ good results. Duplex ordered for flow volume and eval A-V anastomosis per [facility] request. We had the temp cath dc'd 12/18 as it was in for over 3 weeks. Using AVF from here forward.  8. PEA arrest:Brief CPR. 9. Sacral decub - moderate size, getting local WC--> hydrotherapy 10. Anemia ckd/ABLA -off of anticoagulation.  Received blood transfusion. Seen by GI, EGD 12/2 showed duodenal ulcers and esophagitis, on PPI per NG tube.  11. Nutrition - getting TF 12. Secondary hyperparathyroidism: ac sevelamer.  Monitor phosphorus level. 13. Afib:on amio , in NSR. S/p DCCV. 14. Severe MR:per cardiology. 15. Dispo: in ICU still   Subjective:    HD today, goal 3L.  Still intubated.     Objective:   BP 135/74   Pulse 87   Temp (!) 96.8 F (36 C)   Resp (!) 25   Ht _0   (1.803 m)   Wt 80.9 kg   SpO2 100%   BMI 24.88 kg/m   Physical Exam: Gen: lying in bed, NAD CVS: RRR Resp:crackles are better Abd: soft Ext: 1+ pedal edema ACCESS: AVF  Labs: BMET Recent Labs  Lab 11/23/20 0425 11/25/20 0103 11/26/20 0043 11/26/20 1750 11/26/20 1827 11/27/20 0518 11/27/20 1723 11/28/20 0504 11/28/20 1034 11/29/20 0057  NA 130* 132* 135  --  137 134*  --  134* 133* 134*  K 5.1 5.1 4.2  --  3.9 4.0  --  2.6* 2.9* 3.8  CL 91* 94* 96*  --   --  96*  --  94* 92* 95*  CO2 _1 --   --  28  --  _2 GLUCOSE 84 79 82  --   --  84  --  81 91 128*  BUN 95* 76* 40*  --   --  29*  --  24* 26* 40*  CREATININE 9.78* 8.66* 6.10*  --   --  5.81*  --  5.25* 5.71* 6.88*  CALCIUM 8.9 9.2 9.1  --   --  9.6  --  9.2 9.3 9.1  PHOS 5.7*  --   --  4.7*  --  5.2* 3.0 3.6  --  4.3   CBC Recent Labs  Lab 11/25/20 0103 11/25/20 1725 11/26/20 1827 11/27/20 1033 11/28/20 0504 11/29/20 0057  WBC 14.8*  --   --  17.4* 15.9* 22.2*  HGB 6.7*   < > 9.5* 6.9* 7.4* 8.8*  HCT 21.5*   < > 28.0* 21.6* 22.9* 27.3*  MCV 90.7  --   --  91.9 91.6 91.9  PLT 488*  --   --  446* 371 429*   < > = values in this interval not displayed.      Medications:    . sodium chloride   Intravenous Once  . sodium chloride   Intravenous Once  . amiodarone  200 mg Per Tube BID  . atorvastatin  80 mg Per Tube q1800  . chlorhexidine  15 mL Mouth Rinse BID  . chlorhexidine gluconate (MEDLINE KIT)  15 mL Mouth Rinse BID  . Chlorhexidine Gluconate Cloth  6 each Topical Q0600  . Chlorhexidine Gluconate Cloth  6 each Topical Q0600  . [START ON 12/01/2020] collagenase   Topical Daily  . [START ON 12/02/2020] darbepoetin (ARANESP) injection - DIALYSIS  60 mcg Intravenous Q Sat-HD  . docusate  100 mg Per Tube BID  . doxercalciferol  5 mcg Intravenous Q M,W,F  . feeding supplement (PROSource TF)  90 mL Per Tube TID  . guaiFENesin  15 mL Per Tube Q4H  . heparin injection (subcutaneous)   5,000 Units Subcutaneous Q8H  . lactobacillus  1 g Per Tube TID WC  . mouth rinse  15 mL Mouth Rinse 10 times per day  . melatonin  5 mg Per Tube QHS  . metoprolol tartrate  5 mg Intravenous Q8H  . midodrine  20 mg Per Tube TID WC  . multivitamin  1 tablet Per Tube QHS  . pantoprazole sodium  40 mg Per Tube BID  . polyethylene glycol  17 g Per Tube Daily  . QUEtiapine  100 mg Per Tube QHS  . sevelamer carbonate  1.6 g Per Tube TID WC  . sodium chloride flush  10 mL Intracatheter Q8H  . sodium chloride flush  10-40 mL Intracatheter Q12H  . sodium chloride flush  3 mL Intravenous Q12H  . sodium hypochlorite   Topical BID  . sucralfate  1 g Per Tube QID  . ticagrelor  90 mg Per Tube BID     Madelon Lips, MD 11/29/2020, 10:40 AM

## 2020-11-29 NOTE — Progress Notes (Signed)
Nutrition Follow-up  DOCUMENTATION CODES:   Severe malnutrition in context of chronic illness  INTERVENTION:   Recommend continue nutrition support until patient able to consume adequate nutrition PO.   Tube feeding via cortrak: Vital 1.5 at 55 ml/h (1320 ml per day) Prosource TF 90 ml QID  Provides 2300 kcal, 177 gm protein, 1003 ml free water daily -Add 1 packet Juven BID, each packet provides 95 calories, 2.5 grams of protein (collagen), and 9.8 grams of carbohydrate (3 grams sugar); also contains 7 grams of L-arginine and L-glutamine, 300 mg vitamin C, 15 mg vitamin E, 1.2 mcg vitamin B-12, 9.5 mg zinc, 200 mg calcium, and 1.5 g  Calcium Beta-hydroxy-Beta-methylbutyrate to support wound healing  Recommend continue enteral nutrition support via cortrak until pt eating adequately   Continue Renal MVI daily   NUTRITION DIAGNOSIS:   Severe Malnutrition related to chronic illness as evidenced by severe muscle depletion,severe fat depletion.  Being addressed via TF   GOAL:   Patient will meet greater than or equal to 90% of their needs  Progressing  MONITOR:   Diet advancement,TF tolerance,Skin,Labs  REASON FOR ASSESSMENT:   Consult Enteral/tube feeding initiation and management  ASSESSMENT:   Patient with PMH significant for HTN and ESRD in HD. Presents this admission with STEMI.  Pt with hx of diarrhea. Currently on miralax which may need to be d/c'ed if pt with loose stools.  Spoke with pt and wife who was at bedside. Explained continuing TF and only weaning TF if pt able to consume adequate nutrition orally. Pt remains NPO at this time post-extubation.   11/20 PCI to LAD 11/23Intubated 11/24 OG tube in esophagus, unable to advance 11/26 Cortrak attempted but unable to advance past 55 cm, likely at GE junction. DR advanced to duodenal bulb; TF initiated 11/30 Brown malodorous output from mouth and OG, TF held, CT negative for obstruction, ileus with no BM x 10  days. Abd xray with OG tube GE junction and Cortrak now retraced back into stomach; both needing advancement 12/01 iHD not tolerated, CRRT to resume, +large amount of liquid stool post enema, Attempted Cortrak advancement butunsuccessful 12/02 pt with large volume blood from OG tube; s/p EGD with noted severe esophagitis and erosions/ulcerations in duodenal bulb compatible with ischemia (probable ischemic duodenitis)  12/3 extubated; attempted cortrak placement unsuccessful 12/09 Chest tube inserted, SLP advanced diet to Dys 3/Nectar thick 12/14 Diet advanced to Dysphagia 2, Thins 12/19 pt required re-intubation 12/21 cortrak placed; tip gastric 12/22 extubated   Current wt 80.9 kg today pre-HD; Noted outpatient EDW 81.5 kg  Pt is receiving hydrotherapy on unstageable sacral wound.   Labs: Na: 134 Vitamin A, C, Zinc WNL, B12:1116 CBGs 88-118 Meds: colace, hectoral, lactobacillus, renvela with meals, renal MVI, miralax, carafate QID  D10 @ 40 ml/hr   Diet Order:   Diet Order            Diet NPO time specified  Diet effective now                 EDUCATION NEEDS:   No education needs have been identified at this time  Skin:  Skin Assessment: Skin Integrity Issues: Skin Integrity Issues:: Incisions,Unstageable,Stage IV Stage II: n/a Stage IV: bilateral buttocks Unstageable: bilateral buttocks Incisions: L arm  Last BM:  12/22 x 2 small; type 6   Height:   Ht Readings from Last 1 Encounters:  11/26/20 5\' 11"  (1.803 m)    Weight:   Wt Readings from Last 1  Encounters:  11/29/20 80.9 kg    BMI:  Body mass index is 24.88 kg/m.  Estimated Nutritional Needs:   Kcal:  2250-2500  Protein:  135-160 g  Fluid:  1000 mL plus UOP  Travis [name] P., RD, LDN, CNSC See AMiON for contact information

## 2020-11-29 NOTE — Progress Notes (Signed)
Progress Note  Patient Name: Travis [name] Date of Encounter: 11/29/2020  [facility] Cardiologist: Glenetta Hew, MD   Subjective   Patient alert and intubated.  Denies complaints.  Failed attempt at extubation yesterday  Inpatient Medications    Scheduled Meds: . sodium chloride   Intravenous Once  . sodium chloride   Intravenous Once  . amiodarone  200 mg Per Tube BID  . atorvastatin  80 mg Per Tube q1800  . chlorhexidine  15 mL Mouth Rinse BID  . chlorhexidine gluconate (MEDLINE KIT)  15 mL Mouth Rinse BID  . Chlorhexidine Gluconate Cloth  6 each Topical Q0600  . Chlorhexidine Gluconate Cloth  6 each Topical Q0600  . [START ON 12/01/2020] collagenase   Topical Daily  . [START ON 12/02/2020] darbepoetin (ARANESP) injection - DIALYSIS  60 mcg Intravenous Q Sat-HD  . docusate  100 mg Per Tube BID  . doxercalciferol  5 mcg Intravenous Q M,W,F  . feeding supplement (PROSource TF)  90 mL Per Tube TID  . guaiFENesin  15 mL Per Tube Q4H  . heparin injection (subcutaneous)  5,000 Units Subcutaneous Q8H  . lactobacillus  1 g Per Tube TID WC  . mouth rinse  15 mL Mouth Rinse 10 times per day  . melatonin  5 mg Per Tube QHS  . metoprolol tartrate  5 mg Intravenous Q8H  . midodrine  20 mg Per Tube TID WC  . multivitamin  1 tablet Per Tube QHS  . pantoprazole sodium  40 mg Per Tube BID  . polyethylene glycol  17 g Per Tube Daily  . QUEtiapine  100 mg Per Tube QHS  . sevelamer carbonate  1.6 g Per Tube TID WC  . sodium chloride flush  10 mL Intracatheter Q8H  . sodium chloride flush  10-40 mL Intracatheter Q12H  . sodium chloride flush  3 mL Intravenous Q12H  . sodium hypochlorite   Topical BID  . sucralfate  1 g Per Tube QID  . ticagrelor  90 mg Per Tube BID   Continuous Infusions: . sodium chloride 10 mL/hr (11/21/20 1722)  . sodium chloride 250 mL (11/27/20 0902)  . sodium chloride    . albumin human    . albumin human    . albumin human    . albumin human     .  ceFAZolin (ANCEF) IV Stopped (11/27/20 1452)  . dexmedetomidine (PRECEDEX) IV infusion 0.2 mcg/kg/hr (11/29/20 0600)  . dextrose 40 mL/hr at 11/29/20 0600  . feeding supplement (VITAL 1.5 CAL) 1,000 mL (11/28/20 1420)  . phenylephrine (NEO-SYNEPHRINE) Adult infusion    . propofol (DIPRIVAN) infusion Stopped (11/28/20 2334)   PRN Meds: sodium chloride, acetaminophen, albumin human, heparin, LORazepam, ondansetron (ZOFRAN) IV, Resource ThickenUp Clear, senna, silver nitrate applicators, sodium chloride, sodium chloride flush, sodium chloride flush   Vital Signs    Vitals:   11/29/20 0717 11/29/20 0730 11/29/20 0742 11/29/20 0745  BP: 121/68 126/67  131/69  Pulse: 84 85  90  Resp: (!) 21 11  (!) 23  Temp:   (!) 96.8 F (36 C)   TempSrc:      SpO2: 93% 96%  99%  Weight:      Height:        Intake/Output Summary (Last 24 hours) at 11/29/2020 0800 Last data filed at 11/29/2020 0600 Gross per 24 hour  Intake 1495.3 ml  Output --  Net 1495.3 ml   Last 3 Weights 11/29/2020 11/25/2020 11/25/2020  Weight (lbs) 178  lb 5.6 oz 176 lb 2.4 oz 189 lb 2.5 oz  Weight (kg) 80.9 kg 79.9 kg 85.8 kg      Telemetry    Sinus rhythm- Personally Reviewed  ECG    Normal sinus rhythm at 85 with septal Q waves-personally Reviewed  Physical Exam   GEN: No acute distress.  Alert, intubated Neck: No JVD Cardiac: RRR, no murmurs, rubs, or gallops.  Respiratory: Clear to auscultation bilaterally. GI: Soft, nontender, non-distended  MS: No edema; No deformity. Neuro:  Nonfocal  Psych: Normal affect   Labs    High Sensitivity Troponin:   Recent Labs  Lab 11/26/20 0818 11/26/20 0935  TROPONINIHS 287* 750*      Chemistry Recent Labs  Lab 11/23/20 0425 11/25/20 0103 11/28/20 0504 11/28/20 1034 11/29/20 0057  NA 130*   < > 134* 133* 134*  K 5.1   < > 2.6* 2.9* 3.8  CL 91*   < > 94* 92* 95*  CO2 23   < > 28 27 25   GLUCOSE 84   < > 81 91 128*  BUN 95*   < > 24* 26* 40*   CREATININE 9.78*   < > 5.25* 5.71* 6.88*  CALCIUM 8.9   < > 9.2 9.3 9.1  PROT 4.9*  --   --   --   --   ALBUMIN 1.2*  --  1.7*  --  1.6*  AST 17  --   --   --   --   ALT <5  --   --   --   --   ALKPHOS 99  --   --   --   --   BILITOT 0.9  --   --   --   --   GFRNONAA 6*   < > 12* 11* 9*  ANIONGAP 16*   < > 12 14 14    < > = values in this interval not displayed.     Hematology Recent Labs  Lab 11/27/20 1033 11/28/20 0504 11/29/20 0057  WBC 17.4* 15.9* 22.2*  RBC 2.35* 2.50* 2.97*  HGB 6.9* 7.4* 8.8*  HCT 21.6* 22.9* 27.3*  MCV 91.9 91.6 91.9  MCH 29.4 29.6 29.6  MCHC 31.9 32.3 32.2  RDW 14.6 14.6 14.6  PLT 446* 371 429*    BNPNo results for input(s): BNP, PROBNP in the last 168 hours.   DDimer No results for input(s): DDIMER in the last 168 hours.   Radiology    DG Chest Port 1 View  Result Date: 11/28/2020 CLINICAL DATA:  Pulmonary edema EXAM: PORTABLE CHEST 1 VIEW COMPARISON:  11/26/2020 and prior. FINDINGS: Support lines and tubes are unchanged. Cardiomegaly. No pneumothorax. Small right pleural effusion. The left costophrenic sulcus is partially imaged. Decreased conspicuity of patchy bilateral pulmonary opacities. IMPRESSION: Decreased conspicuity of bilateral pulmonary opacities. Small right pleural effusion, unchanged. Electronically Signed   By: Primitivo Gauze M.D.   On: 11/28/2020 07:54    Cardiac Studies   2D echocardiogram (11/26/2020)  IMPRESSIONS    1. Left ventricular ejection fraction, by estimation, is 45 to 50%. The  left ventricle has mildly decreased function. The left ventricle  demonstrates regional wall motion abnormalities (see scoring  diagram/findings for description). There is moderate  concentric left ventricular hypertrophy. Indeterminate diastolic filling  due to E-A fusion.  2. Right ventricular systolic function is normal. The right ventricular  size is normal. Tricuspid regurgitation signal is inadequate for assessing   PA pressure.  3. There is  mild to moderate MR due to restricted PMVL due to mitral  annular calcification (IIIB). MR is slightly worse than the prior study,  but does not appear to be severe. The mitral valve is grossly normal. Mild  to moderate mitral valve  regurgitation. No evidence of mitral stenosis. Moderate mitral annular  calcification.  4. The aortic valve is tricuspid. There is mild calcification of the  aortic valve. There is mild thickening of the aortic valve. Aortic valve  regurgitation is trivial. Mild to moderate aortic valve  sclerosis/calcification is present, without any  evidence of aortic stenosis.  5. The inferior vena cava is dilated in size with >50% respiratory  variability, suggesting right atrial pressure of 8 mmHg.   Comparison(s): No significant change from prior study.   Cardiac catheterization/PCI stent (10/28/2020)  Conclusion    There is mild to moderate left ventricular systolic dysfunction. The left ventricular ejection fraction is 40-45% by visual estimate - > anterior-anterolateral apical hypokinesis  LV end diastolic pressure is severely elevated -> on recheck post PCI, EDP 30 mmHg  Mid LAD lesion is 99% stenosed. 1st Diag lesion is 55% stenosed just proximal to the lesion  A drug-eluting stent was successfully placed crossing the diagonal branch, using a Coker [id number]. Postdilated to 3.1 mm  Post intervention, there is a 0% residual stenosis.  LPAV lesion is 99% stenosed -> ostial lesion (LCx is 90  turn followed by another 90  turn into the AVG)  Prox RCA lesion is 80% stenosed -> concentric napkin ring calcified lesion.   SUMMARY  Acute anterolateral ST elevation MI with Culprit lesion being the 99% mid LAD lesion (just past major 1st Diag/D1) along with multivessel CAD including 99% ostial AV groove LCx and 80% calcified napkin ring proximal RCA lesion with extensive calcification throughout the RCA. ? Successful  PTCA and DES PCI of the LAD crossing D1 -resolute Onyx DES 2.75 mm x 18 mm postdilated to 3.1 mm. ? With PCI, there was stabilization of significant ectopy, AIVR and PVCs  Mild to moderate reduced EF with anterior anterolateral hypokinesis, EF roughly 40 to 45%.  Initial evaluation suggested normal EDP, but on recheck post PCI LVEDP of 30 mmHg, severely elevated consistent with ACUTE DIASTOLIC HEART FAILURE  Significantly dilated aortic root requiring AL-1 guide catheter for both Left and Right Coronary Angiography.   RECOMMENDATIONS  Admit to CCU, restart IV heparin 8 hours after sheath removal  Check 2D echo for better assessment of EF  Plan to review cath films with other IC cardiologist, anticipate staged PCI of at least the RCA which may require atherectomy versus lithotripsy, and likely the ostial AV groove circumflex.  If he has signs of dyspnea, consider earlier dialysis than tomorrow. ->  Would consult for nephrology to see today.  He is already on labetalol and Norvasc.  We will continue current home medications.  Uninterrupted DAPT times X 1 year. Coronary Diagrams   Diagnostic Dominance: Right    Intervention       Patient Profile     Travis [name] is a 59 y.o. male with ESRD, hypertension, tobacco abuse, CAD who was admitted on 10/28/2020 for anterior wall STEMI.  He has residual RCA disease.  Course complicated by A. fib with RVR.  Course also complicated by MSSA bacteremia with shock, upper GI bleeding secondary to severe esophagitis/duodenal ulcer.  Course has also been complicated by bilateral lung abscesses and empyema requiring chest tubes.  He has been to the  ICU twice.  Once for his anterior wall STEMI.  And then back for respiratory failure and chest tube insertion.  He was transferred out of the ICU on 11/25/2020.  On 11/26/2020 he developed acute hypoxic respiratory failure that is ongoing.  Assessment & Plan    1: Acute hypoxic respiratory  failure-patient was ambulating in the hallway up until this weekend when he had acute respiratory decompensation.  He is placed on BiPAP and then intubated.  His chest x-ray showed progressive infiltrates.  2D echo showed no changes with mild to moderate MR and an EF of 45%.  He was dialyzed twice for eval volume overload.  He has good saturations nail at 95%.  He was in A. fib and currently is back to sinus rhythm.  Ventilator management per PCCM.  Plan was for extubation yesterday after dialysis which I see he failed.  He is down over 6 L.  He does have patchy infiltrate on chest x-ray consistent with pulmonary edema.  He is getting dialysis now.  Hopefully he'll be extubated well today.  2: PAF-currently in in sinus rhythm/sinus tach on p.o. amiodarone.  I believe this is driven by his pulmonary decompensation.  He is not a candidate for anticoagulation at this time given his prior GI bleed.  3: CAD-anterior STEMI 10/28/2020 treated with PCI and stenting of his mid LAD by Dr. [name].  He did have residual RCA and ostial nondominant circumflex disease.  He is on Brilinta.  He is cardiovascular stable.  Unfortunately, he cannot come off his antiplatelet therapy.  Plan was for staged RCA intervention once he improves.  Is currently is on hold.  4: Ischemic cardiomyopathy-EF by limited 2D echo yesterday was stable at 40 to 45%.  He is on beta-blocker.  He is a dialysis patient.  We will hold off on optimal medical therapy until he is more stable.  5: Mitral regurgitation-to moderate by 2D echo yesterday, unchanged  6: Anemia-hemoglobin down from 9.5-7.4 this morning.Marland Kitchen  He does have a history of GI bleed.  He was transfused during hemodialysis.  I suspect he will need another unit of blood transfusion during dialysis  7: Chronic renal insufficiency-on renal replacement therapy, followed by the nephrology service.  Getting dialyzed this morning.  8: Infectious disease-pneumonia/empyema.  He also has a  large sacral decubitus ulcer.  On broad-spectrum antibiotics  Cardiovascular stable.  Plans apparently are for attempted extubation today again after failing extubation yesterday.  We will see again on Friday.  For questions or updates, please contact [place] Please consult [email or web address] for contact info under        Signed, Quay Burow, MD  11/29/2020, 8:00 AM

## 2020-11-29 NOTE — Progress Notes (Signed)
PT Cancellation Note  Patient Details Name: Travis [name] MRN: [id number] DOB: 07-26-1961   Cancelled Treatment:    Reason Eval/Treat Not Completed: Medical issues which prohibited therapy (Getting ready to be extubated. Will HOLD today.)   [name] 11/29/2020, 12:08 PM Crecencio Kwiatek W,PT Acute Rehabilitation Services Pager:  [phone]  Office:  [phone]

## 2020-11-29 NOTE — Plan of Care (Signed)
  Problem: Education: Goal: Understanding of CV disease, CV risk reduction, and recovery process will improve Outcome: Progressing   Problem: Activity: Goal: Ability to return to baseline activity level will improve Outcome: Progressing   Problem: Health Behavior/Discharge Planning: Goal: Ability to safely manage health-related needs after discharge will improve Outcome: Progressing   Problem: Education: Goal: Knowledge of General Education information will improve Description: Including pain rating scale, medication(s)/side effects and non-pharmacologic comfort measures Outcome: Progressing   Problem: Health Behavior/Discharge Planning: Goal: Ability to manage health-related needs will improve Outcome: Progressing   Problem: Clinical Measurements: Goal: Ability to maintain clinical measurements within normal limits will improve Outcome: Progressing Goal: Will remain free from infection Outcome: Progressing Goal: Diagnostic test results will improve Outcome: Progressing Goal: Respiratory complications will improve Outcome: Progressing Goal: Cardiovascular complication will be avoided Outcome: Progressing   Problem: Activity: Goal: Risk for activity intolerance will decrease Outcome: Progressing   Problem: Nutrition: Goal: Adequate nutrition will be maintained Outcome: Progressing   Problem: Elimination: Goal: Will not experience complications related to bowel motility Outcome: Progressing   Problem: Safety: Goal: Ability to remain free from injury will improve Outcome: Progressing   Problem: Skin Integrity: Goal: Risk for impaired skin integrity will decrease Outcome: Progressing   Problem: Cardiovascular: Goal: Ability to achieve and maintain adequate cardiovascular perfusion will improve Outcome: Not Progressing   Problem: Coping: Goal: Level of anxiety will decrease Outcome: Not Progressing   Problem: Pain Managment: Goal: General experience of comfort  will improve Outcome: Not Progressing

## 2020-11-29 NOTE — Progress Notes (Signed)
Received patient at [place] 12/22. BP low with MAP in 50's. Contacted [facility]. Decreased precedex infusion dosage.

## 2020-11-29 NOTE — Progress Notes (Signed)
SLP Cancellation Note  Patient Details Name: Travis [name] MRN: [id number] DOB: November 19, 1961   Cancelled treatment:       Reason Eval/Treat Not Completed: Medical issues which prohibited therapy (pt remains on vent). SLP to sign off for now. Please reorder when medically ready.    Osie Bond., M.A. [place] [facility] Pager [phone] Office [phone]  11/29/2020, 8:16 AM
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
## 2020-11-30 DIAGNOSIS — R0603 Acute respiratory distress: Secondary | ICD-10-CM

## 2020-11-30 LAB — RENAL FUNCTION PANEL
Albumin: 1.9 g/dL — ABNORMAL LOW (ref 3.5–5.0)
Anion gap: 12 (ref 5–15)
BUN: 42 mg/dL — ABNORMAL HIGH (ref 6–20)
CO2: 25 mmol/L (ref 22–32)
Calcium: 9.5 mg/dL (ref 8.9–10.3)
Chloride: 96 mmol/L — ABNORMAL LOW (ref 98–111)
Creatinine, Ser: 4.92 mg/dL — ABNORMAL HIGH (ref 0.61–1.24)
GFR, Estimated: 13 mL/min — ABNORMAL LOW (ref >60)
Glucose, Bld: 136 mg/dL — ABNORMAL HIGH (ref 70–99)
Phosphorus: 3.4 mg/dL (ref 2.5–4.6)
Potassium: 3.7 mmol/L (ref 3.5–5.1)
Sodium: 133 mmol/L — ABNORMAL LOW (ref 135–145)

## 2020-11-30 LAB — CBC
HCT: 25 % — ABNORMAL LOW (ref 39.0–52.0)
Hemoglobin: 7.7 g/dL — ABNORMAL LOW (ref 13.0–17.0)
MCH: 28.8 pg (ref 26.0–34.0)
MCHC: 30.8 g/dL (ref 30.0–36.0)
MCV: 93.6 fL (ref 80.0–100.0)
Platelets: 407 10*3/uL — ABNORMAL HIGH (ref 150–400)
RBC: 2.67 MIL/uL — ABNORMAL LOW (ref 4.22–5.81)
RDW: 14.4 % (ref 11.5–15.5)
WBC: 20.7 10*3/uL — ABNORMAL HIGH (ref 4.0–10.5)
nRBC: 0 % (ref 0.0–0.2)

## 2020-11-30 LAB — GLUCOSE, CAPILLARY
Glucose-Capillary: 101 mg/dL — ABNORMAL HIGH (ref 70–99)
Glucose-Capillary: 105 mg/dL — ABNORMAL HIGH (ref 70–99)
Glucose-Capillary: 112 mg/dL — ABNORMAL HIGH (ref 70–99)
Glucose-Capillary: 118 mg/dL — ABNORMAL HIGH (ref 70–99)
Glucose-Capillary: 118 mg/dL — ABNORMAL HIGH (ref 70–99)
Glucose-Capillary: 127 mg/dL — ABNORMAL HIGH (ref 70–99)

## 2020-11-30 LAB — TRIGLYCERIDES: Triglycerides: 71 mg/dL (ref <150)

## 2020-11-30 MED ORDER — ATORVASTATIN CALCIUM 80 MG PO TABS
80.0000 mg | ORAL_TABLET | Freq: Every day | ORAL | Status: DC
  Administered 2020-11-30: 17:00:00 80 mg via ORAL
  Filled 2020-11-30: qty 1

## 2020-11-30 MED ORDER — GUAIFENESIN 100 MG/5ML PO SOLN
15.0000 mL | ORAL | Status: DC
  Administered 2020-11-30 – 2020-12-01 (×6): 300 mg via ORAL
  Filled 2020-11-30: qty 5
  Filled 2020-11-30 (×2): qty 15
  Filled 2020-11-30: qty 25
  Filled 2020-11-30 (×2): qty 15

## 2020-11-30 MED ORDER — JUVEN PO PACK
1.0000 | PACK | Freq: Two times a day (BID) | ORAL | Status: DC
  Administered 2020-11-30 (×2): 1 via ORAL
  Filled 2020-11-30 (×2): qty 1

## 2020-11-30 MED ORDER — AMIODARONE HCL 200 MG PO TABS
200.0000 mg | ORAL_TABLET | Freq: Two times a day (BID) | ORAL | Status: DC
  Administered 2020-11-30 (×2): 200 mg via ORAL
  Filled 2020-11-30 (×2): qty 1

## 2020-11-30 MED ORDER — DOCUSATE SODIUM 50 MG/5ML PO LIQD
100.0000 mg | Freq: Two times a day (BID) | ORAL | Status: DC
  Administered 2020-11-30: 22:00:00 100 mg via ORAL
  Filled 2020-11-30: qty 10

## 2020-11-30 MED ORDER — BACID PO TABS
1.0000 | ORAL_TABLET | Freq: Three times a day (TID) | ORAL | Status: DC
  Administered 2020-11-30 (×3): 1 via ORAL
  Filled 2020-11-30 (×4): qty 1

## 2020-11-30 MED ORDER — POLYETHYLENE GLYCOL 3350 17 G PO PACK: 17.0000 g | PACK | Freq: Every day | ORAL | Status: DC

## 2020-11-30 MED ORDER — SENNA 8.6 MG PO TABS: 1.0000 | ORAL_TABLET | Freq: Every day | ORAL | Status: DC | PRN

## 2020-11-30 MED ORDER — SUCRALFATE 1 GM/10ML PO SUSP
1.0000 g | Freq: Four times a day (QID) | ORAL | Status: DC
  Administered 2020-11-30 (×4): 1 g via ORAL
  Filled 2020-11-30 (×4): qty 10

## 2020-11-30 MED ORDER — QUETIAPINE FUMARATE 100 MG PO TABS
100.0000 mg | ORAL_TABLET | Freq: Every day | ORAL | Status: DC
  Administered 2020-11-30: 22:00:00 100 mg via ORAL
  Filled 2020-11-30: qty 1

## 2020-11-30 MED ORDER — DEXMEDETOMIDINE HCL IN NACL 400 MCG/100ML IV SOLN
0.4000 ug/kg/h | INTRAVENOUS | Status: DC
  Administered 2020-11-30: 18:00:00 1.6 ug/kg/h via INTRAVENOUS
  Administered 2020-11-30: 22:00:00 1.3 ug/kg/h via INTRAVENOUS
  Administered 2020-11-30: 0.5 ug/kg/h via INTRAVENOUS
  Administered 2020-11-30: 14:00:00 1.6 ug/kg/h via INTRAVENOUS
  Administered 2020-11-30: 12:00:00 0.9 ug/kg/h via INTRAVENOUS
  Administered 2020-12-01: 16:00:00 0.6 ug/kg/h via INTRAVENOUS
  Administered 2020-12-01: 10:00:00 1.2 ug/kg/h via INTRAVENOUS
  Administered 2020-12-01 (×2): 1.3 ug/kg/h via INTRAVENOUS
  Administered 2020-12-02 (×2): 1.2 ug/kg/h via INTRAVENOUS
  Administered 2020-12-02 – 2020-12-03 (×2): 0.4 ug/kg/h via INTRAVENOUS
  Filled 2020-11-30 (×11): qty 100
  Filled 2020-11-30: qty 200

## 2020-11-30 MED ORDER — TICAGRELOR 90 MG PO TABS
90.0000 mg | ORAL_TABLET | Freq: Two times a day (BID) | ORAL | Status: DC
  Administered 2020-11-30 (×2): 90 mg via ORAL
  Filled 2020-11-30 (×2): qty 1

## 2020-11-30 MED ORDER — RENA-VITE PO TABS
1.0000 | ORAL_TABLET | Freq: Every day | ORAL | Status: DC
  Administered 2020-11-30: 22:00:00 1 via ORAL
  Filled 2020-11-30: qty 1

## 2020-11-30 MED ORDER — PANTOPRAZOLE SODIUM 40 MG PO PACK
40.0000 mg | PACK | Freq: Two times a day (BID) | ORAL | Status: DC
  Administered 2020-11-30 (×2): 40 mg via ORAL
  Filled 2020-11-30 (×2): qty 20

## 2020-11-30 MED ORDER — MIDODRINE HCL 5 MG PO TABS
20.0000 mg | ORAL_TABLET | Freq: Three times a day (TID) | ORAL | Status: DC
Start: 2020-11-30 — End: 2020-12-01
  Administered 2020-11-30 (×2): 20 mg via ORAL
  Filled 2020-11-30 (×2): qty 4

## 2020-11-30 MED ORDER — ACETAMINOPHEN 325 MG PO TABS: 650.0000 mg | ORAL_TABLET | ORAL | Status: DC | PRN

## 2020-11-30 MED ORDER — SEVELAMER CARBONATE 0.8 G PO PACK
1.6000 g | PACK | Freq: Three times a day (TID) | ORAL | Status: DC
  Administered 2020-11-30 (×2): 1.6 g via ORAL
  Filled 2020-11-30 (×4): qty 2

## 2020-11-30 MED ORDER — MELATONIN 5 MG PO TABS
5.0000 mg | ORAL_TABLET | Freq: Every day | ORAL | Status: DC
  Administered 2020-11-30: 22:00:00 5 mg via ORAL
  Filled 2020-11-30: qty 1
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
## 2020-11-30 NOTE — Progress Notes (Signed)
Physical Therapy Wound Treatment Patient Details  Name: Travis [name] MRN: [id number] Date of Birth: [birth year]  Today's Date: 11/30/2020 Time: 1655-3748 Time Calculation (min): 47 min  Subjective  Subjective: Pt with recent respiratory distress. RN present outside of room, RT present in room with pt and family. Pt holding therapist's hand and smiling at times. Patient and Family Stated Goals: None stated Prior Treatments: Dressing changes  Pain Score:  Pt appeared uncomfortable and restless at times.   Wound Assessment  Pressure Injury 11/14/20 Buttocks Mid;Right;Left Unstageable - Full thickness tissue loss in which the base of the injury is covered by slough (yellow, tan, gray, green or brown) and/or eschar (tan, brown or black) in the wound bed. (Active)  Dressing Type ABD;Barrier Film (skin prep);Gauze (Comment);Moist to dry 11/30/20 1107  Dressing Changed;Clean;Dry;Intact 11/30/20 1107  Dressing Change Frequency Daily 11/30/20 1107  State of Healing Early/partial granulation 11/30/20 1107  Site / Wound Assessment Pink;Red;Yellow 11/30/20 1107  % Wound base Red or Granulating 70% 11/30/20 1107  % Wound base Yellow/Fibrinous Exudate 30% 11/30/20 1107  % Wound base Black/Eschar 0% 11/30/20 1107  % Wound base Other/Granulation Tissue (Comment) 0% 11/30/20 1107  Peri-wound Assessment Intact;Maceration 11/30/20 1107  Wound Length (cm) 10 cm 11/28/20 1000  Wound Width (cm) 15 cm 11/28/20 1000  Wound Depth (cm) 2 cm 11/28/20 1000  Wound Surface Area (cm^2) 150 cm^2 11/28/20 1000  Wound Volume (cm^3) 300 cm^3 11/28/20 1000  Tunneling (cm) 0 11/22/20 1401  Undermining (cm) 1.2 cm at 12:00 11/28/20 1000  Margins Unattached edges (unapproximated) 11/30/20 1107  Drainage Amount Moderate 11/30/20 1107  Drainage Description Serosanguineous 11/30/20 1107  Treatment Hydrotherapy (Pulse lavage);Packing (Saline gauze) 11/30/20 1107   Santyl applied to wound bed prior to applying  dressing.     Hydrotherapy Pulsed lavage therapy - wound location: Buttocks Pulsed Lavage with Suction (psi): 4 psi (4-8 due to pain) Pulsed Lavage with Suction - Normal Saline Used: 1000 mL Pulsed Lavage Tip: Tip with splash shield   Wound Assessment and Plan  Wound Therapy - Assess/Plan/Recommendations Wound Therapy - Clinical Statement: Sharps debridement deferred this session as pt was on BiPAP and restless. Wound bed appearance continues to improve with ~30% necrotic tissue remaining. This patient will benefit from continued hydrotherapy for selective removal of unviable tissue, to decrease bioburden and promote wound bed healing. Wound Therapy - Functional Problem List: Global weakness Factors Delaying/Impairing Wound Healing: Immobility;Multiple medical problems Hydrotherapy Plan: Debridement;Dressing change;Patient/family education;Pulsatile lavage with suction Wound Therapy - Frequency: 6X / week Wound Therapy - Follow Up Recommendations: Skilled nursing facility Wound Plan: See above  Wound Therapy Goals- Improve the function of patient's integumentary system by progressing the wound(s) through the phases of wound healing (inflammation - proliferation - remodeling) by: Decrease Necrotic Tissue to: 20% Decrease Necrotic Tissue - Progress: Progressing toward goal Increase Granulation Tissue to: 80% Increase Granulation Tissue - Progress: Progressing toward goal Goals/treatment plan/discharge plan were made with and agreed upon by patient/family: Yes Time For Goal Achievement: 7 days Wound Therapy - Potential for Goals: Good  Goals will be updated until maximal potential achieved or discharge criteria met.  Discharge criteria: when goals achieved, discharge from hospital, MD decision/surgical intervention, no progress towards goals, refusal/missing three consecutive treatments without notification or medical reason.  GP     Thelma Comp 11/30/2020, 12:36 PM   Rolinda Roan, PT, DPT Acute Rehabilitation Services Pager: [phone] Office: [phone]

## 2020-11-30 NOTE — Progress Notes (Signed)
NAME:  Travis [name], MRN:  [id number], DOB:  1961/06/22, LOS: 20 ADMISSION DATE:  10/28/2020, CONSULTATION DATE:  10/29/20 REFERRING MD:  Cardiology - [name], CHIEF COMPLAINT:  Hypotension, gram positive cocci on culture, AMS  Brief History   Patient is a 59 y.o. male with history of ESRD, HTN, CAD-who presented with anterior STEMI-treated with DES to LAD-hospital course prolonged and complicated by atrial fibrillation with RVR requiring cardioversion x2,, septic shock due to MSSA bacteremia, brief PEA arrest requiring CPR, acute hypoxic respiratory failure requiring intubation, upper GI bleed with acute blood loss anemia due to severe esophagitis requiring PRBC transfusion, severe metabolic encephalopathy/ICU delirium, bilateral lung abscesses with empyema requiring chest tube insertion  Stabilized in the ICU-subsequently transferred to the [facility] hospitalist service on 12/12, his chest tube was dislodged on the floor, he started developing increasing right-sided pleural effusion/empyema, patient was transferred back to ICU and chest tube was placed.   Past Medical History  HTN ESRD, HD MWF  Significant Hospital Events   Cardioversion 11/21 Cardiac cath stent to LAD 11/20  Consults:  PCCM  Nephrology ID  Procedures:  Central line 11/21 >> 11/29 Arterial line 11/21 out Repeat DCCV 11/23 ETT 11/23 > 12/3 ETT 12/12 > 12/22 HD line 11/24 RIJ >> Arterial line 11/24 out L [place] CVC 11/30>>  Right-sided chest tube 12/14>>  Significant Diagnostic Tests:   11/24 TEE>>Left ventricular ejection fraction, by estimation, is 35 to 40%. The  left ventricle has moderately decreased function.Moderate to severe mitral regurgitation.  11/23 CT Chest>>Extensive multifocal nodular and patchy airspace disease in both lungs with a slight peripheral predominance in the upper lungs. 3.1 cm nodular consolidative opacity in the right upper lobe is cavitated. Imaging features likely related to  multifocal pneumonia. Septic emboli and metastatic disease considered less likely but not excluded.  11/22 LUE Vas Upper Extremity Doppler>> Arteriovenous fistula-Aneurysmal dilatation noted.  11/24 CT abdomen - no retroperitoneal hematoma  11/29 MR Brain>> No evidence acute intracranial abnormality, sinisitis, mastoid effusions  11/29>> MR Cervical Spine>> Given the provided history of bacteremia, facet joint septic arthritis is difficult to definitively exclude, but the lack of any surrounding marrow edema argues against this Cervical spondylosis   11/29 MR Thoracic Spine No significant marrow edema in thoracic spine , Mild thoracic spondylosis hypointense marrow signal throughout the thoracic spine, likely related to the patient's end-stage renal disease. multifocal airspace disease and cavitary pulmonary lesions. Small right pleural effusion.  11/29 MR Lumbar Spine:As noted above and Suspected cholecystolithiasis  12/11 CT chest  improving air space disease - R empyema persists but adequate chest tube position.   12/19 CXR -bilateral infiltration on the chest x-ray, possible collection right lung base  Micro Data:  11/21 BCx2>>Staph aureus > MSSA by BCID 11/22 BCx 2 >> neg 11/23 trach asp >> staph aureus 11/24 BCx2>> neg 11/30 BC x 2>> staph epi>> MRSA 12/2/ BCx 2 >> neg 12/9 right pleural fluid >> negative  Antimicrobials:  Zosyn 11/21 x 1 Ancef 11/21 ->   Interim history/subjective:  Continued issues with anxiety overnight Progressive worsening respiratory distress with associated muscle use is seen this morning Chronic application of BiPAP early in a.m.  Objective   Blood pressure (!) 149/77, pulse (!) 104, temperature 99 F (37.2 C), temperature source Oral, resp. rate (!) 30, height 5\' 11"  (1.803 m), weight 78.9 kg, SpO2 93 %.    Vent Mode: BIPAP;PSV FiO2 (%):  [30 %-50 %] 50 % PEEP:  [5 cmH20-8 cmH20] 8 cmH20  Pressure Support:  [5 cmH20-8 cmH20] 8 cmH20    Intake/Output Summary (Last 24 hours) at 11/30/2020 1104 Last data filed at 11/30/2020 0600 Gross per 24 hour  Intake 966.81 ml  Output --  Net 966.81 ml   Filed Weights   11/25/20 0850 11/29/20 0713 11/30/20 0417  Weight: 79.9 kg 80.9 kg 78.9 kg   Physical exam  General: Chronically ill appearing elderly deconditioned middle-aged male lying in bed in moderate anxiety and mild respiratory distress HEENT: [place]/AT, MM pink/moist, PERRL, core track in place Neuro: Alert and oriented, appears moderately anxious, nonfocal CV: s1s2 regular rate and rhythm, no murmur, rubs, or gallops,  PULM: Clear breath sounds bilaterally, no increased work of breathing, mild use accessory muscle use GI: soft, bowel sounds active in all 4 quadrants, non-tender, non-distended, tolerating TF Extremities: warm/dry, no edema  Skin: no rashes or lesions   Resolved Hospital Problem list   Parapneumonic effusion status post chest tube placement with DNase and TPA on 12/13 -Chest tube removed 12/19  Assessment & Plan:  Acute hypoxemic respiratory failure -Chest x-ray continue to worsen despite ability for volume removal over the last 2 sessions however on further history from wife patient had missed several rounds of dialysis prior to transfer back to ICU due to "being bumped" patient also was unable to undergo volume removal on several occasions due to hypotension  -CTA chest shows bilateral groundglass opacities consistent with likely pulmonary edema  P: Tolerated extubation morning of 12/22 Preserved with productive appropriate cough evening of 12/22 with ability for deep inspirations Morning of 12/23 patient is seen with increasing respiratory rate and mild accessory muscle use prompting initiation of BiPAP Resume Precedex drip to control anxiety Continue routine IHD treatments for volume removal Continue to encourage adequate and frequent pulmonary hygiene Continue BiPAP therapy for now If patient has  further aspiratory compromise patient and family would proceed with reintubation and eventual trach, please see separate plan of care/goals of care note for further detail  ESRD on HD- MWF -Suspicion is high that respiratory decompensation was related to volume overload P: Nephrology following, appreciate assistance Continue intermittent IHD -7 L Follow renal function/urine output Trend to be mets Avoid nephrotoxins  Anterior ST elevation MI s/p DES 11/20 Ischemic cardiomyopathy with an ejection fraction of 40 to 45% Paroxysmal atrial fibrillation -Anticoagulation on hold due to recent GI bleed Mitral regurgitation P: Cardiology following, appreciate assistance Continue Brilinta, statin, and amiodarone Remains not a candidate for anticoagulation given recent GI bleed Continue antiplatelet therapy Will need distal RCA intervention once stabilized Continuous telemetry  MSSA bacteremia -Seen with multidrug-resistant Staph epidermidis P: Evaluated by infectious disease during hospitalization, recommendations made for prolonged antibiotic coverage Continue cefazolin x6 weeks with stop date in place  Unstageable sacral ulcer P: Continue hydrotherapy as able WOC consulted Pressure alleviating devices Frequent every 2 hours turns  Generalized deconditioning P: PT/OT as able  Anemia of chronic illness Anemia of critical illness Recent GI bleed  -status post EGD showing severe esophagitis and duodenal ulceration P: Trend CBC Transfuse per protocol Hemoglobin goal greater than 8  Labs   CBC: Recent Labs  Lab 11/25/20 0103 11/25/20 1725 11/26/20 1827 11/27/20 1033 11/28/20 0504 11/29/20 0057 11/30/20 0040  WBC 14.8*  --   --  17.4* 15.9* 22.2* 20.7*  HGB 6.7*   < > 9.5* 6.9* 7.4* 8.8* 7.7*  HCT 21.5*   < > 28.0* 21.6* 22.9* 27.3* 25.0*  MCV 90.7  --   --  91.9  91.6 91.9 93.6  PLT 488*  --   --  446* 371 429* 407*   < > = values in this interval not displayed.     Basic Metabolic Panel: Recent Labs  Lab  0000 11/26/20 0043 11/26/20 1750 11/26/20 1827 11/27/20 0518 11/27/20 1723 11/28/20 0504 11/28/20 1034 11/29/20 0057 11/30/20 0040  NA  --  135  --    < > 134*  --  134* 133* 134* 133*  K  --  4.2  --    < > 4.0  --  2.6* 2.9* 3.8 3.7  CL  --  96*  --   --  96*  --  94* 92* 95* 96*  CO2  --  30  --   --  28  --  28 27 25 25   GLUCOSE  --  82  --   --  84  --  81 91 128* 136*  BUN  --  40*  --   --  29*  --  24* 26* 40* 42*  CREATININE  --  6.10*  --   --  5.81*  --  5.25* 5.71* 6.88* 4.92*  CALCIUM  --  9.1  --   --  9.6  --  9.2 9.3 9.1 9.5  MG  --  2.2 2.0  --  2.1 1.8 1.8  --   --   --   PHOS   < >  --  4.7*  --  5.2* 3.0 3.6  --  4.3 3.4   < > = values in this interval not displayed.   GFR: Estimated Creatinine Clearance: 17.4 mL/min (A) (by C-G formula based on SCr of 4.92 mg/dL (H)). Recent Labs  Lab 11/27/20 1033 11/28/20 0504 11/29/20 0057 11/30/20 0040  WBC 17.4* 15.9* 22.2* 20.7*    Liver Function Tests: Recent Labs  Lab 11/28/20 0504 11/29/20 0057 11/30/20 0040  ALBUMIN 1.7* 1.6* 1.9*   No results for input(s): LIPASE, AMYLASE in the last 168 hours. No results for input(s): AMMONIA in the last 168 hours.  ABG    Component Value Date/Time   PHART 7.497 (H) 11/26/2020 1827   PCO2ART 40.7 11/26/2020 1827   PO2ART 77 (L) 11/26/2020 1827   HCO3 31.6 (H) 11/26/2020 1827   TCO2 33 (H) 11/26/2020 1827   ACIDBASEDEF 0.3 11/16/2020 2200   O2SAT 96.0 11/26/2020 1827     Coagulation Profile: No results for input(s): INR, PROTIME in the last 168 hours.  Cardiac Enzymes: No results for input(s): CKTOTAL, CKMB, CKMBINDEX, TROPONINI in the last 168 hours.  HbA1C: Hgb A1c MFr Bld  Date/Time Value Ref Range Status  10/28/2020 06:04 AM 4.3 (L) 4.8 - 5.6 % Final    Comment:    (NOTE) Pre diabetes:          5.7%-6.4%  Diabetes:              >6.4%  Glycemic control for   <7.0% adults with diabetes    01/30/2015 09:09 PM 5.1 4.8 - 5.6 % Final    Comment:    (NOTE)         Pre-diabetes: 5.7 - 6.4         Diabetes: >6.4         Glycemic control for adults with diabetes: <7.0     CBG: Recent Labs  Lab 11/29/20 1535 11/29/20 2009 11/29/20 2339 11/30/20 0408 11/30/20 0753  GLUCAP 89 110* 127* 118* 101*   CRITICAL CARE Performed by:  Johnsie Cancel  Total critical care time: 28minutes  Critical care time was exclusive of separately billable procedures and treating other patients.  Critical care was necessary to treat or prevent imminent or life-threatening deterioration.  Critical care was time spent personally by me on the following activities: development of treatment plan with patient and/or surrogate as well as nursing, discussions with consultants, evaluation of patient's response to treatment, examination of patient, obtaining history from patient or surrogate, ordering and performing treatments and interventions, ordering and review of laboratory studies, ordering and review of radiographic studies, pulse oximetry and re-evaluation of patient's condition.  Johnsie Cancel, NP-C [facility] Contact / Pager information can be found on Amion  11/30/2020, 11:04 AM

## 2020-11-30 NOTE — Progress Notes (Signed)
EKG CRITICAL VALUE     12 lead EKG performed.  Critical value noted. Costella Hatcher, RN notified.   Ryder Man 11/30/2020 7:25 AM

## 2020-11-30 NOTE — Progress Notes (Signed)
OT Cancellation Note  Patient Details Name: NAEL PETROSYAN MRN: [id number] DOB: 1961/06/10   Cancelled Treatment:    Reason Eval/Treat Not Completed: Patient not medically ready.  Patient placed on BiPap, nursing requested OT/PT hold.  Continue efforts as appropriate.    Rashmi Tallent D Sumiko Ceasar 11/30/2020, 11:47 AM

## 2020-11-30 NOTE — Progress Notes (Addendum)
Progress Note  Patient Name: Travis [name] Date of Encounter: 11/30/2020  [facility] Cardiologist: Glenetta Hew, MD   Subjective   Patient alert.  Appears somewhat anxious.  Extubated successfully yesterday.  Bowel incontinence this morning.  Inpatient Medications    Scheduled Meds: . sodium chloride   Intravenous Once  . sodium chloride   Intravenous Once  . amiodarone  200 mg Oral BID  . atorvastatin  80 mg Oral q1800  . chlorhexidine  15 mL Mouth Rinse BID  . chlorhexidine gluconate (MEDLINE KIT)  15 mL Mouth Rinse BID  . Chlorhexidine Gluconate Cloth  6 each Topical Q0600  . Chlorhexidine Gluconate Cloth  6 each Topical Q0600  . [START ON 12/01/2020] collagenase   Topical Daily  . [START ON 12/02/2020] darbepoetin (ARANESP) injection - DIALYSIS  60 mcg Intravenous Q Sat-HD  . docusate  100 mg Oral BID  . doxercalciferol  5 mcg Intravenous Q M,W,F  . feeding supplement (PROSource TF)  90 mL Per Tube QID  . guaiFENesin  15 mL Oral Q4H  . heparin injection (subcutaneous)  5,000 Units Subcutaneous Q8H  . lactobacillus acidophilus  1 tablet Oral TID  . mouth rinse  15 mL Mouth Rinse 10 times per day  . melatonin  5 mg Oral QHS  . metoprolol tartrate  5 mg Intravenous Q8H  . midodrine  20 mg Oral TID WC  . multivitamin  1 tablet Oral QHS  . nutrition supplement (JUVEN)  1 packet Oral BID BM  . pantoprazole sodium  40 mg Oral BID  . polyethylene glycol  17 g Oral Daily  . QUEtiapine  100 mg Oral QHS  . sevelamer carbonate  1.6 g Oral TID WC  . sodium chloride flush  10 mL Intracatheter Q8H  . sodium chloride flush  10-40 mL Intracatheter Q12H  . sodium chloride flush  3 mL Intravenous Q12H  . sodium hypochlorite   Topical BID  . sucralfate  1 g Oral QID  . ticagrelor  90 mg Oral BID   Continuous Infusions: . sodium chloride 10 mL/hr (11/21/20 1722)  . sodium chloride 250 mL (11/27/20 0902)  . sodium chloride    . albumin human    .  ceFAZolin (ANCEF) IV 2  g (11/29/20 1425)  . dextrose 40 mL/hr at 11/29/20 1818  . feeding supplement (VITAL 1.5 CAL) 1,000 mL (11/29/20 1455)  . phenylephrine (NEO-SYNEPHRINE) Adult infusion     PRN Meds: sodium chloride, acetaminophen, albumin human, heparin, LORazepam, ondansetron (ZOFRAN) IV, oxyCODONE, Resource ThickenUp Clear, senna, silver nitrate applicators, sodium chloride, sodium chloride flush, sodium chloride flush   Vital Signs    Vitals:   11/30/20 0412 11/30/20 0417 11/30/20 0500 11/30/20 0752  BP:   (!) 149/77   Pulse:   (!) 105 (!) 104  Resp:   (!) 24 (!) 30  Temp: 99 F (37.2 C)     TempSrc: Oral     SpO2:   (!) 88% 93%  Weight:  78.9 kg    Height:        Intake/Output Summary (Last 24 hours) at 11/30/2020 0829 Last data filed at 11/30/2020 0600 Gross per 24 hour  Intake 966.81 ml  Output 3000 ml  Net -2033.19 ml   Last 3 Weights 11/30/2020 11/29/2020 11/25/2020  Weight (lbs) 173 lb 15.1 oz 178 lb 5.6 oz 176 lb 2.4 oz  Weight (kg) 78.9 kg 80.9 kg 79.9 kg      Telemetry    Sinus  rhythm- Personally Reviewed  ECG    Sinus tachycardia 105 with septal Q waves, there did appear to be more prominent anterior lateral ST segment elevation.  -personally Reviewed  Physical Exam   GEN:  Appears somewhat anxious.  Alert, extubated, Neck: No JVD Cardiac: RRR, no murmurs, rubs, or gallops.  Respiratory: Clear to auscultation bilaterally. GI: Soft, nontender, non-distended  MS: No edema; No deformity. Neuro:  Nonfocal  Psych: Normal affect   Labs    High Sensitivity Troponin:   Recent Labs  Lab 11/26/20 0818 11/26/20 0935  TROPONINIHS 287* 750*      Chemistry Recent Labs  Lab 11/28/20 0504 11/28/20 1034 11/29/20 0057 11/30/20 0040  NA 134* 133* 134* 133*  K 2.6* 2.9* 3.8 3.7  CL 94* 92* 95* 96*  CO2 _0 GLUCOSE 81 91 128* 136*  BUN 24* 26* 40* 42*  CREATININE 5.25* 5.71* 6.88* 4.92*  CALCIUM 9.2 9.3 9.1 9.5  ALBUMIN 1.7*  --  1.6* 1.9*  GFRNONAA 12*  11* 9* 13*  ANIONGAP _1 Hematology Recent Labs  Lab 11/28/20 0504 11/29/20 0057 11/30/20 0040  WBC 15.9* 22.2* 20.7*  RBC 2.50* 2.97* 2.67*  HGB 7.4* 8.8* 7.7*  HCT 22.9* 27.3* 25.0*  MCV 91.6 91.9 93.6  MCH 29.6 29.6 28.8  MCHC 32.3 32.2 30.8  RDW 14.6 14.6 14.4  PLT 371 429* 407*    BNPNo results for input(s): BNP, PROBNP in the last 168 hours.   DDimer No results for input(s): DDIMER in the last 168 hours.   Radiology    DG CHEST PORT 1 VIEW  Result Date: 11/29/2020 CLINICAL DATA:  Intubated EXAM: PORTABLE CHEST 1 VIEW COMPARISON:  11/28/2020 FINDINGS: Endotracheal tube and partially imaged enteric tube. Persistent bilateral primarily perihilar and basilar opacities. Similar small right pleural effusion. No pneumothorax. Stable cardiomediastinal contours. IMPRESSION: Stable lines and tubes. Persistent bilateral perihilar and basilar opacities and small right pleural effusion. Electronically Signed   By: Macy Mis M.D.   On: 11/29/2020 10:38    Cardiac Studies   2D echocardiogram (11/26/2020)  IMPRESSIONS    1. Left ventricular ejection fraction, by estimation, is 45 to 50%. The  left ventricle has mildly decreased function. The left ventricle  demonstrates regional wall motion abnormalities (see scoring  diagram/findings for description). There is moderate  concentric left ventricular hypertrophy. Indeterminate diastolic filling  due to E-A fusion.  2. Right ventricular systolic function is normal. The right ventricular  size is normal. Tricuspid regurgitation signal is inadequate for assessing  PA pressure.  3. There is mild to moderate MR due to restricted PMVL due to mitral  annular calcification (IIIB). MR is slightly worse than the prior study,  but does not appear to be severe. The mitral valve is grossly normal. Mild  to moderate mitral valve  regurgitation. No evidence of mitral stenosis. Moderate mitral annular  calcification.   4. The aortic valve is tricuspid. There is mild calcification of the  aortic valve. There is mild thickening of the aortic valve. Aortic valve  regurgitation is trivial. Mild to moderate aortic valve  sclerosis/calcification is present, without any  evidence of aortic stenosis.  5. The inferior vena cava is dilated in size with >50% respiratory  variability, suggesting right atrial pressure of 8 mmHg.   Comparison(s): No significant change from prior study.   Cardiac catheterization/PCI stent (10/28/2020)  Conclusion    There is mild to moderate left ventricular  systolic dysfunction. The left ventricular ejection fraction is 40-45% by visual estimate - > anterior-anterolateral apical hypokinesis  LV end diastolic pressure is severely elevated -> on recheck post PCI, EDP 30 mmHg  Mid LAD lesion is 99% stenosed. 1st Diag lesion is 55% stenosed just proximal to the lesion  A drug-eluting stent was successfully placed crossing the diagonal branch, using a Hartman [id number]. Postdilated to 3.1 mm  Post intervention, there is a 0% residual stenosis.  LPAV lesion is 99% stenosed -> ostial lesion (LCx is 90  turn followed by another 90  turn into the AVG)  Prox RCA lesion is 80% stenosed -> concentric napkin ring calcified lesion.   SUMMARY  Acute anterolateral ST elevation MI with Culprit lesion being the 99% mid LAD lesion (just past major 1st Diag/D1) along with multivessel CAD including 99% ostial AV groove LCx and 80% calcified napkin ring proximal RCA lesion with extensive calcification throughout the RCA. ? Successful PTCA and DES PCI of the LAD crossing D1 -resolute Onyx DES 2.75 mm x 18 mm postdilated to 3.1 mm. ? With PCI, there was stabilization of significant ectopy, AIVR and PVCs  Mild to moderate reduced EF with anterior anterolateral hypokinesis, EF roughly 40 to 45%.  Initial evaluation suggested normal EDP, but on recheck post PCI LVEDP of 30 mmHg,  severely elevated consistent with ACUTE DIASTOLIC HEART FAILURE  Significantly dilated aortic root requiring AL-1 guide catheter for both Left and Right Coronary Angiography.   RECOMMENDATIONS  Admit to CCU, restart IV heparin 8 hours after sheath removal  Check 2D echo for better assessment of EF  Plan to review cath films with other IC cardiologist, anticipate staged PCI of at least the RCA which may require atherectomy versus lithotripsy, and likely the ostial AV groove circumflex.  If he has signs of dyspnea, consider earlier dialysis than tomorrow. ->  Would consult for nephrology to see today.  He is already on labetalol and Norvasc.  We will continue current home medications.  Uninterrupted DAPT times X 1 year. Coronary Diagrams   Diagnostic Dominance: Right    Intervention       Patient Profile     Travis [name] is a 59 y.o. male with ESRD, hypertension, tobacco abuse, CAD who was admitted on 10/28/2020 for anterior wall STEMI.  He has residual RCA disease.  Course complicated by A. fib with RVR.  Course also complicated by MSSA bacteremia with shock, upper GI bleeding secondary to severe esophagitis/duodenal ulcer.  Course has also been complicated by bilateral lung abscesses and empyema requiring chest tubes.  He has been to the ICU twice.  Once for his anterior wall STEMI.  And then back for respiratory failure and chest tube insertion.  He was transferred out of the ICU on 11/25/2020.  On 11/26/2020 he developed acute hypoxic respiratory failure that is ongoing.  Assessment & Plan    1: Acute hypoxic respiratory failure-patient was ambulating in the hallway up until this weekend when he had acute respiratory decompensation.  He is placed on BiPAP and then intubated.  His chest x-Travis showed progressive infiltrates.  2D echo showed no changes with mild to moderate MR and an EF of 45%.  He was dialyzed twice for eval volume overload.  He has good saturations  nail at 95%.  He was in A. fib and currently is back to sinus rhythm.  Ventilator management per PCCM.  Plan was for extubation yesterday after dialysis which I suspect will happen  today.  He is down almost 8 L.  He does have patchy infiltrate on chest x-Travis that appears somewhat worse yesterday.  2: PAF-currently in in sinus rhythm/sinus tach on p.o. amiodarone.  I believe this is driven by his pulmonary decompensation.  He is not a candidate for anticoagulation at this time given his prior GI bleed.  He is on DVT prophylaxis however.  3: CAD-anterior STEMI 10/28/2020 treated with PCI and stenting of his mid LAD by Dr. [name].  He did have residual RCA and ostial nondominant circumflex disease.  He is on Brilinta.  He is cardiovascular stable.  Unfortunately, he cannot come off his antiplatelet therapy.  Plan was for staged RCA intervention once he improves.  Is currently is on hold.  His EKG today showed more prominent anterolateral ST segment elevation compared to prior EKGs although he has had uninterrupted dual antiplatelet therapy and denies chest pain.  I do not feel this represents acute stent thrombosis.  4: Ischemic cardiomyopathy-EF by limited 2D echo yesterday was stable at 40 to 45%.  He is on beta-blocker.  He is a dialysis patient.  We will hold off on optimal medical therapy until he is more stable.  5: Mitral regurgitation-to moderate by 2D echo yesterday, unchanged  6: Anemia-hemoglobin down from 9.5-7.4 this morning.Marland Kitchen  He does have a history of GI bleed.  He was transfused during hemodialysis.  I suspect he will need another unit of blood transfusion during dialysis  7: Chronic renal insufficiency-on renal replacement therapy, followed by the nephrology service  8: Infectious disease-pneumonia/empyema.  He also has a large sacral decubitus ulcer.  On broad-spectrum antibiotics  Successfully extubated yesterday.  Sats look okay followed by PCCM.  Had 3 L of during dialysis  yesterday.  Cardiovascular stable.  We will see again over the weekend.  For questions or updates, please contact [place] Please consult [email or web address] for contact info under        Signed, Quay Burow, MD  11/30/2020, 8:29 AM

## 2020-11-30 NOTE — Progress Notes (Signed)
EKG was done by tech and was reported to still be showing a STEMI. MD is aware and it has been unchanged for the past few days.

## 2020-11-30 NOTE — Progress Notes (Signed)
PT Cancellation Note  Patient Details Name: Travis [name] MRN: [id number] DOB: 1961-09-15   Cancelled Treatment:    Reason Eval/Treat Not Completed: Medical issues which prohibited therapy (Pt on Bipap this am and may have to be trached. Will return tomorrow as RN asked PT to [place] 11/30/2020, 1:58 PM Ernie Kasler W,PT Acute Rehabilitation Services Pager:  [phone]  Office:  [phone]

## 2020-11-30 NOTE — Progress Notes (Signed)
Pt said he does not want to go on BIPAP. He is on salter 10 L and resting well. He denies SOB his O2 saturation is 100. RN aware. Told RN to call me if pt changes his mind.

## 2020-11-30 NOTE — Plan of Care (Signed)
  Interdisciplinary Goals of Care Family Meeting   Date carried out:: 11/30/2020  Location of the meeting: Bedside  Member's involved: Nurse Practitioner -Merlene Laughter, NP, Bedside Registered Nurse - Rod Holler and Myra - Spouse, and Son - Rae Halsted Power of Attorney or acting medical decision maker: Shared by spouse and son  Discussion: We discussed goals of care for Travis [name] . Given Nigil's recurrent respiratory distress morning after extubation we had a multiple disciplinary goals of care discussion at bedside morning of 12/23. Lelend has a very prolonged hospitalization with multiple complications resulting in significant decompensation and evolving anxiety. It was expressed to family that if [name] were to require reintubation his likelihood of needing a tracheostomy tube was extremely high. The fact that [name] is an end-stage renal patient on chronic hemodialysis this will make disposition planning extremely difficult. The family was informed that Alic would likely require transition to long-term care facility if tracheostomy tube is in place. Patient spouse and son both endorsed desire for aggressive measures to prolong [name]'s life as long as possible including and not limited to tracheostomy tube if needed  I asked [name] if this is something he would want initially he shook his head no to a tracheostomy tube but when asked if he would like to pursue comfort measures in light of possible need of tracheostomy he denied and wished to proceed with all aggressive measures.  For now we will continue to provide all aggressive interventions. Will remain on BiPAP for respiratory support, if patient were to decompensate we would proceed with intubation and eventual tracheostomy placement  Code status: Full Code  Disposition: Continue current acute care  Time spent for the meeting: [place], NP-C [place] / Pager information can  be found on Amion  11/30/2020, 11:20 AM

## 2020-11-30 NOTE — Progress Notes (Signed)
[facility] Progress Note   Assessment/ Plan:   OP HD:AF MWF 4h 450/800 81.5kg 2/2.25 bath RUE AVF Hep5000+1040md-run - hect 5 ug tiw  - mircera 50 ugIVq 4 weeks, last 11/15 (due 11/29)  Assessment/ Plan: 1. Acute hypoxic RF: had HD Sunday without improvement and was intubated--> appeared to have frothy pink secretions in ETT.  HD 12/20 with increased UF goal --> on max midodrine + albumin helped.  S/p HD 12/22, max UF, using albumin and midodrine to improve UF--3L off 2. Acute STEMI- S/P LAD PCI11/20, has otherdisease of LCx/RCA.Transitioned to brilinta. Per cardiology. 3. R parapneumonic effusion: Chest tube placed then dislodged, replaced again 12/14/ sp removal.   4. MSSA cavitary PNA / bacteremia- sp vent support. Blood cx+. Getting 6 wks IV Ancef per pharm ending 12/30/20. F/U bcx's negative. [place] neg for abscess.  Sputum culture recheck 12/19 negative 5. Hypotension - on max dose midodrine (20 TID) 6. ESRD - usual HD MWF.  Required CRRT from 11/24-12/4.  Tolerating iHD. Unable to use heparin because of GI bleed/anemia. HD 12/22, next planned 12/24 with max UF again 7. HD access - fistulogram done 12/13 by IR here, treated 2 outflow stenoses w/ good results. Duplex ordered for flow volume and eval A-V anastomosis per [facility] request. We had the temp cath dc'd 12/18 as it was in for over 3 weeks. Using AVF from here forward.  8. PEA arrest:Brief CPR. 9. Sacral decub - moderate size, getting local WC--> hydrotherapy 10. Anemia ckd/ABLA -off of anticoagulation.  Received blood transfusion. Seen by GI, EGD 12/2 showed duodenal ulcers and esophagitis, on PPI per NG tube.  11. Nutrition - getting TF 12. Secondary hyperparathyroidism: ac sevelamer.  Monitor phosphorus level. 13. Afib:on amio , in NSR. S/p DCCV. 14. Severe MR:per cardiology 15. Dispo: in ICU still   Subjective:    Extubated yesterday.  Got 3L off in dialysis yesterday too.   Getting wound care today.  Tmax 99 F    Objective:   BP (!) 149/77   Pulse (!) 104   Temp 99 F (37.2 C) (Oral)   Resp (!) 30   Ht _0  (1.803 m)   Wt 78.9 kg   SpO2 93%   BMI 24.26 kg/m   Physical Exam: Gen: lying in bed, getting wound care CVS: RRR Resp:crackles are better Abd: soft Ext: 1+ pedal edema ACCESS: AVF SKIN: Stage III/VI sacral decub  Labs: BMET Recent Labs  Lab 11/25/20 0103 11/26/20 0043 11/26/20 1750 11/26/20 1827 11/27/20 0518 11/27/20 1723 11/28/20 0504 11/28/20 1034 11/29/20 0057 11/30/20 0040  NA 132* 135  --  137 134*  --  134* 133* 134* 133*  K 5.1 4.2  --  3.9 4.0  --  2.6* 2.9* 3.8 3.7  CL 94* 96*  --   --  96*  --  94* 92* 95* 96*  CO2 26 30  --   --  28  --  _1 GLUCOSE 79 82  --   --  84  --  81 91 128* 136*  BUN 76* 40*  --   --  29*  --  24* 26* 40* 42*  CREATININE 8.66* 6.10*  --   --  5.81*  --  5.25* 5.71* 6.88* 4.92*  CALCIUM 9.2 9.1  --   --  9.6  --  9.2 9.3 9.1 9.5  PHOS  --   --  4.7*  --  5.2* 3.0 3.6  --  4.3 3.4   CBC Recent Labs  Lab 11/27/20 1033 11/28/20 0504 11/29/20 0057 11/30/20 0040  WBC 17.4* 15.9* 22.2* 20.7*  HGB 6.9* 7.4* 8.8* 7.7*  HCT 21.6* 22.9* 27.3* 25.0*  MCV 91.9 91.6 91.9 93.6  PLT 446* 371 429* 407*      Medications:    . sodium chloride   Intravenous Once  . sodium chloride   Intravenous Once  . amiodarone  200 mg Oral BID  . atorvastatin  80 mg Oral q1800  . chlorhexidine  15 mL Mouth Rinse BID  . chlorhexidine gluconate (MEDLINE KIT)  15 mL Mouth Rinse BID  . Chlorhexidine Gluconate Cloth  6 each Topical Q0600  . Chlorhexidine Gluconate Cloth  6 each Topical Q0600  . [START ON 12/01/2020] collagenase   Topical Daily  . [START ON 12/02/2020] darbepoetin (ARANESP) injection - DIALYSIS  60 mcg Intravenous Q Sat-HD  . docusate  100 mg Oral BID  . doxercalciferol  5 mcg Intravenous Q M,W,F  . feeding supplement (PROSource TF)  90 mL Per Tube QID  . guaiFENesin  15 mL Oral  Q4H  . heparin injection (subcutaneous)  5,000 Units Subcutaneous Q8H  . lactobacillus acidophilus  1 tablet Oral TID  . mouth rinse  15 mL Mouth Rinse 10 times per day  . melatonin  5 mg Oral QHS  . metoprolol tartrate  5 mg Intravenous Q8H  . midodrine  20 mg Oral TID WC  . multivitamin  1 tablet Oral QHS  . nutrition supplement (JUVEN)  1 packet Oral BID BM  . pantoprazole sodium  40 mg Oral BID  . polyethylene glycol  17 g Oral Daily  . QUEtiapine  100 mg Oral QHS  . sevelamer carbonate  1.6 g Oral TID WC  . sodium chloride flush  10 mL Intracatheter Q8H  . sodium chloride flush  10-40 mL Intracatheter Q12H  . sodium chloride flush  3 mL Intravenous Q12H  . sodium hypochlorite   Topical BID  . sucralfate  1 g Oral QID  . ticagrelor  90 mg Oral BID     Madelon Lips, MD 11/30/2020, 10:40 AM

## 2020-12-01 DIAGNOSIS — I5041 Acute combined systolic (congestive) and diastolic (congestive) heart failure: Secondary | ICD-10-CM | POA: Diagnosis not present

## 2020-12-01 DIAGNOSIS — R7881 Bacteremia: Secondary | ICD-10-CM | POA: Diagnosis not present

## 2020-12-01 DIAGNOSIS — I2109 ST elevation (STEMI) myocardial infarction involving other coronary artery of anterior wall: Secondary | ICD-10-CM | POA: Diagnosis not present

## 2020-12-01 DIAGNOSIS — I4891 Unspecified atrial fibrillation: Secondary | ICD-10-CM | POA: Diagnosis not present

## 2020-12-01 LAB — GLUCOSE, CAPILLARY
Glucose-Capillary: 100 mg/dL — ABNORMAL HIGH (ref 70–99)
Glucose-Capillary: 104 mg/dL — ABNORMAL HIGH (ref 70–99)
Glucose-Capillary: 105 mg/dL — ABNORMAL HIGH (ref 70–99)
Glucose-Capillary: 108 mg/dL — ABNORMAL HIGH (ref 70–99)
Glucose-Capillary: 114 mg/dL — ABNORMAL HIGH (ref 70–99)
Glucose-Capillary: 123 mg/dL — ABNORMAL HIGH (ref 70–99)
Glucose-Capillary: 93 mg/dL (ref 70–99)

## 2020-12-01 LAB — RENAL FUNCTION PANEL
Albumin: 1.6 g/dL — ABNORMAL LOW (ref 3.5–5.0)
Anion gap: 15 (ref 5–15)
BUN: 82 mg/dL — ABNORMAL HIGH (ref 6–20)
CO2: 25 mmol/L (ref 22–32)
Calcium: 9.7 mg/dL (ref 8.9–10.3)
Chloride: 92 mmol/L — ABNORMAL LOW (ref 98–111)
Creatinine, Ser: 7.01 mg/dL — ABNORMAL HIGH (ref 0.61–1.24)
GFR, Estimated: 8 mL/min — ABNORMAL LOW (ref >60)
Glucose, Bld: 138 mg/dL — ABNORMAL HIGH (ref 70–99)
Phosphorus: 4.1 mg/dL (ref 2.5–4.6)
Potassium: 4.5 mmol/L (ref 3.5–5.1)
Sodium: 132 mmol/L — ABNORMAL LOW (ref 135–145)

## 2020-12-01 LAB — CBC
HCT: 23.7 % — ABNORMAL LOW (ref 39.0–52.0)
Hemoglobin: 7.3 g/dL — ABNORMAL LOW (ref 13.0–17.0)
MCH: 28.3 pg (ref 26.0–34.0)
MCHC: 30.8 g/dL (ref 30.0–36.0)
MCV: 91.9 fL (ref 80.0–100.0)
Platelets: 405 10*3/uL — ABNORMAL HIGH (ref 150–400)
RBC: 2.58 MIL/uL — ABNORMAL LOW (ref 4.22–5.81)
RDW: 14.2 % (ref 11.5–15.5)
WBC: 20.9 10*3/uL — ABNORMAL HIGH (ref 4.0–10.5)
nRBC: 0 % (ref 0.0–0.2)

## 2020-12-01 MED ORDER — MELATONIN 5 MG PO TABS
5.0000 mg | ORAL_TABLET | Freq: Every day | ORAL | Status: DC
  Administered 2020-12-01 – 2020-12-04 (×4): 5 mg
  Filled 2020-12-01 (×4): qty 1

## 2020-12-01 MED ORDER — ACETAMINOPHEN 325 MG PO TABS: 650.0000 mg | ORAL_TABLET | ORAL | Status: DC | PRN

## 2020-12-01 MED ORDER — AMIODARONE HCL 200 MG PO TABS
200.0000 mg | ORAL_TABLET | Freq: Two times a day (BID) | ORAL | Status: DC
  Administered 2020-12-01 – 2020-12-04 (×7): 200 mg
  Filled 2020-12-01 (×8): qty 1

## 2020-12-01 MED ORDER — MIDODRINE HCL 5 MG PO TABS
20.0000 mg | ORAL_TABLET | Freq: Three times a day (TID) | ORAL | Status: DC
  Administered 2020-12-01 – 2020-12-03 (×9): 20 mg
  Filled 2020-12-01 (×11): qty 4

## 2020-12-01 MED ORDER — BACID PO TABS
1.0000 | ORAL_TABLET | Freq: Three times a day (TID) | ORAL | Status: DC
  Administered 2020-12-01 – 2020-12-04 (×12): 1
  Filled 2020-12-01 (×13): qty 1

## 2020-12-01 MED ORDER — DOCUSATE SODIUM 50 MG/5ML PO LIQD
100.0000 mg | Freq: Two times a day (BID) | ORAL | Status: DC
  Administered 2020-12-01 – 2020-12-04 (×5): 100 mg
  Filled 2020-12-01 (×4): qty 10

## 2020-12-01 MED ORDER — QUETIAPINE FUMARATE 100 MG PO TABS
100.0000 mg | ORAL_TABLET | Freq: Every day | ORAL | Status: DC
  Administered 2020-12-01 – 2020-12-04 (×4): 100 mg
  Filled 2020-12-01 (×4): qty 1

## 2020-12-01 MED ORDER — SENNA 8.6 MG PO TABS: 1.0000 | ORAL_TABLET | Freq: Every day | ORAL | Status: DC | PRN

## 2020-12-01 MED ORDER — POLYETHYLENE GLYCOL 3350 17 G PO PACK
17.0000 g | PACK | Freq: Every day | ORAL | Status: DC
  Administered 2020-12-01 – 2020-12-03 (×3): 17 g
  Filled 2020-12-01 (×3): qty 1

## 2020-12-01 MED ORDER — RENA-VITE PO TABS
1.0000 | ORAL_TABLET | Freq: Every day | ORAL | Status: DC
  Administered 2020-12-01 – 2020-12-04 (×4): 1
  Filled 2020-12-01 (×3): qty 1

## 2020-12-01 MED ORDER — ALBUMIN HUMAN 25 % IV SOLN
INTRAVENOUS | Status: AC
  Administered 2020-12-01: 08:00:00 25 g via INTRAVENOUS
  Filled 2020-12-01: qty 100

## 2020-12-01 MED ORDER — DOXERCALCIFEROL 4 MCG/2ML IV SOLN
INTRAVENOUS | Status: AC
  Administered 2020-12-01: 10:00:00 5 ug via INTRAVENOUS
  Filled 2020-12-01: qty 4

## 2020-12-01 MED ORDER — GUAIFENESIN 100 MG/5ML PO SOLN
15.0000 mL | ORAL | Status: DC
  Administered 2020-12-01 – 2020-12-05 (×23): 300 mg
  Filled 2020-12-01 (×3): qty 15
  Filled 2020-12-01: qty 5
  Filled 2020-12-01: qty 15
  Filled 2020-12-01: qty 10
  Filled 2020-12-01 (×5): qty 15
  Filled 2020-12-01: qty 5
  Filled 2020-12-01 (×3): qty 15
  Filled 2020-12-01 (×2): qty 5
  Filled 2020-12-01: qty 15
  Filled 2020-12-01 (×2): qty 5
  Filled 2020-12-01: qty 25
  Filled 2020-12-01: qty 15
  Filled 2020-12-01: qty 5
  Filled 2020-12-01: qty 15
  Filled 2020-12-01: qty 5

## 2020-12-01 MED ORDER — SEVELAMER CARBONATE 0.8 G PO PACK
1.6000 g | PACK | Freq: Three times a day (TID) | ORAL | Status: DC
  Administered 2020-12-01 – 2020-12-05 (×12): 1.6 g
  Filled 2020-12-01 (×14): qty 2

## 2020-12-01 MED ORDER — SUCRALFATE 1 GM/10ML PO SUSP
1.0000 g | Freq: Four times a day (QID) | ORAL | Status: DC
  Administered 2020-12-01 – 2020-12-04 (×15): 1 g
  Filled 2020-12-01 (×16): qty 10

## 2020-12-01 MED ORDER — OXYCODONE HCL 5 MG/5ML PO SOLN
5.0000 mg | Freq: Three times a day (TID) | ORAL | Status: DC | PRN
  Administered 2020-12-01 – 2020-12-05 (×3): 5 mg
  Filled 2020-12-01 (×4): qty 5

## 2020-12-01 MED ORDER — JUVEN PO PACK
1.0000 | PACK | Freq: Two times a day (BID) | ORAL | Status: DC
  Administered 2020-12-01 – 2020-12-05 (×8): 1
  Filled 2020-12-01 (×9): qty 1

## 2020-12-01 MED ORDER — TICAGRELOR 90 MG PO TABS
90.0000 mg | ORAL_TABLET | Freq: Two times a day (BID) | ORAL | Status: DC
  Administered 2020-12-01 – 2020-12-04 (×8): 90 mg
  Filled 2020-12-01 (×8): qty 1

## 2020-12-01 MED ORDER — PANTOPRAZOLE SODIUM 40 MG PO PACK
40.0000 mg | PACK | Freq: Two times a day (BID) | ORAL | Status: DC
  Administered 2020-12-01 – 2020-12-04 (×8): 40 mg
  Filled 2020-12-01 (×8): qty 20

## 2020-12-01 MED ORDER — ATORVASTATIN CALCIUM 80 MG PO TABS
80.0000 mg | ORAL_TABLET | Freq: Every day | ORAL | Status: DC
  Administered 2020-12-01 – 2020-12-04 (×4): 80 mg
  Filled 2020-12-01 (×4): qty 1

## 2020-12-01 NOTE — Plan of Care (Signed)
°  Problem: Education: Goal: Understanding of CV disease, CV risk reduction, and recovery process will improve Outcome: Progressing   Problem: Activity: Goal: Ability to return to baseline activity level will improve Outcome: Progressing   Problem: Cardiovascular: Goal: Ability to achieve and maintain adequate cardiovascular perfusion will improve Outcome: Progressing   Problem: Health Behavior/Discharge Planning: Goal: Ability to safely manage health-related needs after discharge will improve Outcome: Progressing   Problem: Education: Goal: Knowledge of General Education information will improve Description: Including pain rating scale, medication(s)/side effects and non-pharmacologic comfort measures Outcome: Progressing   Problem: Health Behavior/Discharge Planning: Goal: Ability to manage health-related needs will improve Outcome: Progressing   Problem: Clinical Measurements: Goal: Ability to maintain clinical measurements within normal limits will improve Outcome: Progressing Goal: Will remain free from infection Outcome: Progressing Goal: Diagnostic test results will improve Outcome: Progressing Goal: Respiratory complications will improve Outcome: Progressing Goal: Cardiovascular complication will be avoided Outcome: Progressing   Problem: Activity: Goal: Risk for activity intolerance will decrease Outcome: Progressing   Problem: Nutrition: Goal: Adequate nutrition will be maintained Outcome: Progressing   Problem: Coping: Goal: Level of anxiety will decrease Outcome: Progressing   Problem: Elimination: Goal: Will not experience complications related to bowel motility Outcome: Progressing   Problem: Pain Managment: Goal: General experience of comfort will improve Outcome: Progressing   Problem: Safety: Goal: Ability to remain free from injury will improve Outcome: Progressing   Problem: Skin Integrity: Goal: Risk for impaired skin integrity will  decrease Outcome: Progressing

## 2020-12-01 NOTE — Progress Notes (Addendum)
NAME:  Travis [name], MRN:  [id number], DOB:  03/18/1961, LOS: 40 ADMISSION DATE:  10/28/2020, CONSULTATION DATE:  10/29/20 REFERRING MD:  Cardiology - [name], CHIEF COMPLAINT:  Hypotension, gram positive cocci on culture, AMS  Brief History   Patient is a 59 y.o. male with history of ESRD, HTN, CAD-who presented with anterior STEMI-treated with DES to LAD-hospital course prolonged and complicated by atrial fibrillation with RVR requiring cardioversion x2,, septic shock due to MSSA bacteremia, brief PEA arrest requiring CPR, acute hypoxic respiratory failure requiring intubation, upper GI bleed with acute blood loss anemia due to severe esophagitis requiring PRBC transfusion, severe metabolic encephalopathy/ICU delirium, bilateral lung abscesses with empyema requiring chest tube insertion   Past Medical History  HTN ESRD, HD MWF  Significant Hospital Events   11/20 STEMI-stent LAD.  11/21 Afib with RVR, s.p cardioversion  11/23 S/P DCCV of recurrent Afib. Intubated. Septic PNA and cardiogenic shock.  11/24 Brief brady arrest ADD: Start CRRT.  11/30 Stool output from mouth> Ileus.  12/2 EGD: Esophagitis. No bleeding and probable ischemic duodenitis  12/3 extubated.  12/4 CRRT d/c'd. Delirious  12/5 iHD  12/9 R CT inserted for pleural effusion.  12/11 Given a course of TPA and lytics via chest tube 12/12 Tx to [facility],  12/13 Pt turned over and pulled chest tube and there is leak, stat CXR ordered.  12/14 Chest tube replaced. Readmit to ICU with worsening pleural effusion and hypotension. Levo started.  12/19 Txf back to ICU for resp failure, failed bipap and was intubated.  12/22 Extubated.  Has tenuous respiratory status requiring BiPAP 12/23 Multidisciplinary goals of care meeting.  Remains full code.   Consults:  PCCM  Nephrology ID  Procedures:  Central line 11/21 >> 11/29 Arterial line 11/21 out Repeat DCCV 11/23 ETT 11/23 > 12/3 ETT 12/12 > 12/22 HD line 11/24 RIJ  >> Arterial line 11/24 out L [place] CVC 11/30>>  Right-sided chest tube 12/14>>  Significant Diagnostic Tests:   11/24 TEE>>Left ventricular ejection fraction, by estimation, is 35 to 40%. The  left ventricle has moderately decreased function.Moderate to severe mitral regurgitation.  11/23 CT Chest>>Extensive multifocal nodular and patchy airspace disease in both lungs with a slight peripheral predominance in the upper lungs. 3.1 cm nodular consolidative opacity in the right upper lobe is cavitated. Imaging features likely related to multifocal pneumonia. Septic emboli and metastatic disease considered less likely but not excluded.  11/22 LUE Vas Upper Extremity Doppler>> Arteriovenous fistula-Aneurysmal dilatation noted.  11/24 CT abdomen - no retroperitoneal hematoma  11/29 MR Brain>> No evidence acute intracranial abnormality, sinisitis, mastoid effusions  11/29>> MR Cervical Spine>> Given the provided history of bacteremia, facet joint septic arthritis is difficult to definitively exclude, but the lack of any surrounding marrow edema argues against this Cervical spondylosis   11/29 MR Thoracic Spine No significant marrow edema in thoracic spine , Mild thoracic spondylosis hypointense marrow signal throughout the thoracic spine, likely related to the patient's end-stage renal disease. multifocal airspace disease and cavitary pulmonary lesions. Small right pleural effusion.  11/29 MR Lumbar Spine:As noted above and Suspected cholecystolithiasis  12/11 CT chest  improving air space disease - R empyema persists but adequate chest tube position.    Micro Data:  11/21 BCx2>>Staph aureus > MSSA by BCID 11/22 BCx 2 >> neg 11/23 trach asp >> staph aureus 11/24 BCx2>> neg 11/30 BC x 2>> staph epi>> MRSA 12/2/ BCx 2 >> neg 12/9 right pleural fluid >> negative  Antimicrobials:  Zosyn  11/21 x 1 Ancef 11/21 ->   Interim history/subjective:   On hemodialysis today.  Remains off BiPAP.  On  10 L salter  Objective   Blood pressure 122/72, pulse (!) 110, temperature 97.6 F (36.4 C), resp. rate (!) 37, height 5\' 11"  (1.803 m), weight 67.7 kg, SpO2 92 %.    Vent Mode: BIPAP;PSV FiO2 (%):  [50 %] 50 % PEEP:  [8 cmH20] 8 cmH20 Pressure Support:  [8 cmH20] 8 cmH20   Intake/Output Summary (Last 24 hours) at 12/01/2020 0853 Last data filed at 12/01/2020 0700 Gross per 24 hour  Intake 2745.04 ml  Output 100 ml  Net 2645.04 ml   Filed Weights   11/30/20 0417 12/01/20 0400 12/01/20 0715  Weight: 78.9 kg 81.1 kg 67.7 kg   Physical exam  Gen:      No acute distress, chronically ill-appearing HEENT:  EOMI, sclera anicteric Neck:     No masses; no thyromegaly Lungs:    Clear to auscultation bilaterally; normal respiratory effort CV:         Regular rate and rhythm; no murmurs Abd:      + bowel sounds; soft, non-tender; no palpable masses, no distension Ext:    No edema; adequate peripheral perfusion Skin:      Warm and dry; no rash Neuro: Awake, responsive  Labs/imaging reviewed Significant for BUN/creatinine 82/7.01, WBC 20.9, hemoglobin 7.3 No new imaging   Resolved Hospital Problem list   Parapneumonic effusion status post chest tube placement with DNase and TPA on 12/13 -Chest tube removed 12/19  Assessment & Plan:  Acute hypoxemic respiratory failure -Chest x-ray continue to worsen despite ability for volume removal over the last 2 sessions however on further history from wife patient had missed several rounds of dialysis prior to transfer back to ICU due to "being bumped" patient also was unable to undergo volume removal on several occasions due to hypotension  -CTA chest shows bilateral groundglass opacities consistent with likely pulmonary edema  P: Tolerated extubation morning of 12/22 Continues to have tenuous respiratory status requiring intermittent BiPAP Anxiety is a major component and he remains on Precedex drip Goals of care meeting yesterday.  If he  is reintubated then will need a tracheostomy.  ESRD on HD- MWF P: Continue hemodialysis per nephrology  Anterior ST elevation MI s/p DES 11/20 Ischemic cardiomyopathy with an ejection fraction of 40 to 45% Paroxysmal atrial fibrillation -Anticoagulation on hold due to recent GI bleed Mitral regurgitation P: Cardiology following, appreciate assistance Continue Brilinta, statin, and amiodarone Remains not a candidate for anticoagulation given recent GI bleed Continue antiplatelet therapy Will need distal RCA intervention once stabilized Continuous telemetry  MSSA bacteremia -Seen with multidrug-resistant Staph epidermidis P: Evaluated by infectious disease during hospitalization, recommendations made for prolonged antibiotic coverage Continue cefazolin x6 weeks with stop date in place  Unstageable sacral ulcer P: Continue hydrotherapy as able WOC consulted Pressure alleviating devices Frequent every 2 hours turns  Generalized deconditioning P: PT/OT as able  Anemia of chronic illness Anemia of critical illness Recent GI bleed  -status post EGD showing severe esophagitis and duodenal ulceration P: Trend CBC Transfuse per protocol Hemoglobin goal greater than 8  Critical care:   The patient is critically ill with multiple organ system failure and requires high complexity decision making for assessment and support, frequent evaluation and titration of therapies, advanced monitoring, review of radiographic studies and interpretation of complex data.   Critical Care Time devoted to patient care services, exclusive of separately  billable procedures, described in this note is 35  minutes.   Marshell Garfinkel MD [facility] Please see [email or web address] for pager details.  12/01/2020, 8:53 AM

## 2020-12-01 NOTE — Plan of Care (Signed)
  Problem: Cardiovascular: Goal: Ability to achieve and maintain adequate cardiovascular perfusion will improve Outcome: Progressing   Problem: Education: Goal: Knowledge of General Education information will improve Description: Including pain rating scale, medication(s)/side effects and non-pharmacologic comfort measures Outcome: Progressing   Problem: Clinical Measurements: Goal: Cardiovascular complication will be avoided Outcome: Progressing

## 2020-12-01 NOTE — Progress Notes (Signed)
Inpatient Rehabilitation-Admissions Coordinator   [facility] following. Note pt is not medically ready for CIR at this time.   Will check back next week.   Raechel Ache, OTR/L  Rehab Admissions Coordinator  [phone] 12/01/2020 12:13 PM

## 2020-12-01 NOTE — Progress Notes (Signed)
Physical Therapy Wound Treatment Patient Details  Name: Travis [name] MRN: [id number] Date of Birth: 01-28-1961  Today's Date: 12/01/2020 Time: 1200-1304 Time Calculation (min): 64 min  Subjective  Subjective: Pt pleasant and thankful for therapist's efforts. Patient and Family Stated Goals: None stated Prior Treatments: Dressing changes  Pain Score: Pt was premedicated and tolerated treatment with moderate pain on the faces pain scale (5/10)  Wound Assessment  Pressure Injury 11/14/20 Buttocks Mid;Right;Left Unstageable - Full thickness tissue loss in which the base of the injury is covered by slough (yellow, tan, gray, green or brown) and/or eschar (tan, brown or black) in the wound bed. (Active)  Dressing Type ABD;Barrier Film (skin prep);Gauze (Comment);Moist to dry 12/01/20 1400  Dressing Changed;Clean;Dry;Intact 12/01/20 1400  Dressing Change Frequency Daily 12/01/20 1400  State of Healing Early/partial granulation 12/01/20 1400  Site / Wound Assessment Pink;Red;Yellow 12/01/20 1400  % Wound base Red or Granulating 70% 12/01/20 1400  % Wound base Yellow/Fibrinous Exudate 20% 12/01/20 1400  % Wound base Black/Eschar 10% 12/01/20 1400  % Wound base Other/Granulation Tissue (Comment) 0% 12/01/20 1400  Peri-wound Assessment Intact;Maceration 12/01/20 1400  Wound Length (cm) 10 cm 11/28/20 1000  Wound Width (cm) 15 cm 11/28/20 1000  Wound Depth (cm) 2 cm 11/28/20 1000  Wound Surface Area (cm^2) 150 cm^2 11/28/20 1000  Wound Volume (cm^3) 300 cm^3 11/28/20 1000  Tunneling (cm) 0 11/22/20 1401  Undermining (cm) 1.2 cm at 12:00 11/28/20 1000  Margins Unattached edges (unapproximated) 12/01/20 1400  Drainage Amount Minimal 12/01/20 1400  Drainage Description Serosanguineous 12/01/20 1400  Treatment Debridement (Selective);Hydrotherapy (Pulse lavage);Packing (Saline gauze) 12/01/20 1400   Santyl applied to wound bed prior to applying dressing.     Hydrotherapy Pulsed lavage  therapy - wound location: Buttocks Pulsed Lavage with Suction (psi): 4 psi (4-8 due to pain) Pulsed Lavage with Suction - Normal Saline Used: 1000 mL Pulsed Lavage Tip: Tip with splash shield Selective Debridement Selective Debridement - Location: Buttocks Selective Debridement - Tools Used: Forceps;Scissors Selective Debridement - Tissue Removed: yellow unviable tissue   Wound Assessment and Plan  Wound Therapy - Assess/Plan/Recommendations Wound Therapy - Clinical Statement: The wound bed continues to improve with minimal unviable tissue able to be removed during debridement. I feel this wound is appropriate for a decrease in hydrotherapy frequency to 3x/week. This patient will benefit from continued hydrotherapy for selective removal of unviable tissue, to decrease bioburden and promote wound bed healing. Wound Therapy - Functional Problem List: Global weakness Factors Delaying/Impairing Wound Healing: Immobility;Multiple medical problems Hydrotherapy Plan: Debridement;Dressing change;Patient/family education;Pulsatile lavage with suction Wound Therapy - Frequency: 3X / week Wound Therapy - Follow Up Recommendations: Skilled nursing facility Wound Plan: See above  Wound Therapy Goals- Improve the function of patient's integumentary system by progressing the wound(s) through the phases of wound healing (inflammation - proliferation - remodeling) by: Decrease Necrotic Tissue to: 0 Decrease Necrotic Tissue - Progress: Progressing toward goal Increase Granulation Tissue to: 100 Increase Granulation Tissue - Progress: Progressing toward goal Goals/treatment plan/discharge plan were made with and agreed upon by patient/family: Yes Time For Goal Achievement: 7 days Wound Therapy - Potential for Goals: Good  Goals will be updated until maximal potential achieved or discharge criteria met.  Discharge criteria: when goals achieved, discharge from hospital, MD decision/surgical intervention, no  progress towards goals, refusal/missing three consecutive treatments without notification or medical reason.  GP     Thelma Comp 12/01/2020, 2:09 PM   Rolinda Roan, PT, DPT Acute Rehabilitation Services  Pager: [phone] Office: [phone]

## 2020-12-01 NOTE — Progress Notes (Signed)
Progress Note  Patient Name: Travis [name] Date of Encounter: 12/01/2020  [facility] Cardiologist: Glenetta Hew, MD   Patient Profile     59 y.o. male with ESRD on IHD, Ascending AoTAA - admitted with  Anterior STEMI 11/21 ->  PCI of the LAD. He has residual disease in his right coronary and AVG-Cx for planned PCI.  PTA was not feeing well for several days - chills & nausea.  On Cath - LVEDP 30-35 mmHg -> had been drinking lots of H20 & in settting of ACS - acute DHF --> developed pulmonary Edema = Acute HD.   Was then febrile PM 11/20-21 - > now 2/2 BC + Staph a (1 reported MSSA).  Overnight 11/21-22 -> went into Afib RVR - became hypotensive - Now in Septic Shock. after initial restoration of sinus rhythm, went back into A. fib RVR Now in Septic Shock. ->  Chest x-ray and CT of the chest on 11/23 confirm right lower lobe staph pneumonia -> DCCV on 10/31/2020-> followed by intubation by late afternoon. Overnight 11/23-24, had significant agitation requiring sedation that led to PEA arrest with short CPR and ROSC.  Core-Pac OGT placed in IR  Principal Problem:   Acute ST elevation myocardial infarction (STEMI) of anterolateral wall (HCC) Active Problems:   ESRD (end stage renal disease) ([place])   3 V- CAD w/ ACS/STEMI: Culprit = 99% mLAD @ D1 (DES PCI jailing D1); 99% ost-AVGLCx, 80% calcified napkin ring prox RCA.    Presence of drug coated stent in LAD coronary artery: Resolute Onyx DES 2.75 mm x 18 mm (3.1 mm) at major D1 & SP1.   Acute combined systolic and diastolic heart failure (HCC)   Atrial fibrillation with RVR (HCC)   Bacteremia   Septic shock due to Staphylococcus aureus (HCC)   Pneumonia of right lower lobe due to methicillin susceptible Staphylococcus aureus (MSSA) (HCC)   Severe mitral regurgitation   Hyperlipidemia with target LDL less than 70   UGIB (upper gastrointestinal bleed)   Essential hypertension   Cardiac arrest ([place])   Acute myocardial  infarction ([place])   Acute ST elevation myocardial infarction (STEMI) involving left anterior descending (LAD) coronary artery (HCC)   Pressure injury of skin   Protein-calorie malnutrition, severe   Pleural effusion   Empyema (HCC)   Chest tube in place   Subjective   Stable night overnight.  Tolerated extubation.  Still has some confusion, but did not require excess sedation.  Trying to wean Precedex.  No longer on pressors.  Tolerating dialysis.  Remains incontinent of stool with rectal tube in place.  Feeding tube in place.  -> Very talkative today.  Perseverating on the concept of not having been to be placed, but having significant religious revelations that he is trying to express.  Does not complain of chest pain or dyspnea.  Inpatient Medications    Scheduled Meds: . sodium chloride   Intravenous Once  . sodium chloride   Intravenous Once  . amiodarone  200 mg Per Tube BID  . atorvastatin  80 mg Per Tube q1800  . chlorhexidine  15 mL Mouth Rinse BID  . chlorhexidine gluconate (MEDLINE KIT)  15 mL Mouth Rinse BID  . Chlorhexidine Gluconate Cloth  6 each Topical Q0600  . Chlorhexidine Gluconate Cloth  6 each Topical Q0600  . collagenase   Topical Daily  . [START ON 12/02/2020] darbepoetin (ARANESP) injection - DIALYSIS  60 mcg Intravenous Q Sat-HD  . docusate  100  mg Per Tube BID  . doxercalciferol  5 mcg Intravenous Q M,W,F  . feeding supplement (PROSource TF)  90 mL Per Tube QID  . guaiFENesin  15 mL Per Tube Q4H  . heparin injection (subcutaneous)  5,000 Units Subcutaneous Q8H  . lactobacillus acidophilus  1 tablet Per Tube TID  . mouth rinse  15 mL Mouth Rinse 10 times per day  . melatonin  5 mg Per Tube QHS  . metoprolol tartrate  5 mg Intravenous Q8H  . midodrine  20 mg Per Tube TID WC  . multivitamin  1 tablet Per Tube QHS  . nutrition supplement (JUVEN)  1 packet Per Tube BID BM  . pantoprazole sodium  40 mg Per Tube BID  . polyethylene glycol  17 g Per Tube  Daily  . QUEtiapine  100 mg Per Tube QHS  . sevelamer carbonate  1.6 g Per Tube TID WC  . sodium chloride flush  10 mL Intracatheter Q8H  . sodium chloride flush  10-40 mL Intracatheter Q12H  . sodium chloride flush  3 mL Intravenous Q12H  . sucralfate  1 g Per Tube QID  . ticagrelor  90 mg Per Tube BID   Continuous Infusions: . sodium chloride 10 mL/hr (11/21/20 1722)  . sodium chloride 250 mL (11/27/20 0902)  . sodium chloride    .  ceFAZolin (ANCEF) IV 2 g (12/01/20 1017)  . dexmedetomidine (PRECEDEX) IV infusion 1.2 mcg/kg/hr (12/01/20 0959)  . feeding supplement (VITAL 1.5 CAL) 55 mL/hr at 12/01/20 0700   PRN Meds: sodium chloride, acetaminophen, heparin, LORazepam, ondansetron (ZOFRAN) IV, oxyCODONE, Resource ThickenUp Clear, senna, silver nitrate applicators, sodium chloride, sodium chloride flush, sodium chloride flush   Vital Signs    Vitals:   12/01/20 1045 12/01/20 1100 12/01/20 1104 12/01/20 1143  BP: (!) 123/94 115/62 (!) 110/59   Pulse: 63  70   Resp: (!) 29 (!) 28 (!) 29   Temp:   97.7 F (36.5 C) 97.8 F (36.6 C)  TempSrc:   Oral   SpO2: 92% 100% 100%   Weight:   64.9 kg   Height:        Intake/Output Summary (Last 24 hours) at 12/01/2020 1255 Last data filed at 12/01/2020 1104 Gross per 24 hour  Intake 2294.21 ml  Output 3100 ml  Net -805.79 ml   Last 3 Weights 12/01/2020 12/01/2020 12/01/2020  Weight (lbs) 143 lb 1.3 oz 149 lb 4 oz 178 lb 12.7 oz  Weight (kg) 64.9 kg 67.7 kg 81.1 kg      Telemetry  Appears to be in sinus rhythm in the 70s to 90s- Personally Reviewed  ECG    Sinus rhythm heart rate 79.  1  AV block borderline IVCD with minimal T wave inversions in lateral leads.  Still has evolutionary changes of anterior MI with anterior Q waves suggestive of septal infarct, age undetermined.- Personally Reviewed  Physical Exam   GEN:  Awake and alert, is talking clear sentences, but tangential speech.  Perseverating on avoiding chest tube  placement.  Is self-deprecating with multiple religious revelations feeling that he is having a bout with the devil. Neck:  Mild JVD but difficult to assess due to continues talking Cardiac I RRR with normal S1 and S2.  2/6 HSM.   Respiratory:  Coarse diffuse breath sounds throughout.  Rhonchorous at both bases right with the left. GI:  Somewhat tense but nontender nondistended normoactive bowel sounds. MS:  Is swelling at both IV access sites,  but not continued. Neuro/ PSYCH: Awake and alert to person place and time, but is still somewhat confused and having very vibrant conversations   Labs    High Sensitivity Troponin:   Recent Labs  Lab 11/26/20 0818 11/26/20 0935  TROPONINIHS 287* 750*      Chemistry Recent Labs  Lab 11/29/20 0057 11/30/20 0040 12/01/20 0732  NA 134* 133* 132*  K 3.8 3.7 4.5  CL 95* 96* 92*  CO2 25 25 25   GLUCOSE 128* 136* 138*  BUN 40* 42* 82*  CREATININE 6.88* 4.92* 7.01*  CALCIUM 9.1 9.5 9.7  ALBUMIN 1.6* 1.9* 1.6*  GFRNONAA 9* 13* 8*  ANIONGAP 14 12 15      Hematology Recent Labs  Lab 11/29/20 0057 11/30/20 0040 12/01/20 0709  WBC 22.2* 20.7* 20.9*  RBC 2.97* 2.67* 2.58*  HGB 8.8* 7.7* 7.3*  HCT 27.3* 25.0* 23.7*  MCV 91.9 93.6 91.9  MCH 29.6 28.8 28.3  MCHC 32.2 30.8 30.8  RDW 14.6 14.4 14.2  PLT 429* 407* 405*    BNPNo results for input(s): BNP, PROBNP in the last 168 hours.   DDimer No results for input(s): DDIMER in the last 168 hours.   Radiology    No results found.  Cardiac Studies    Cath PCI 11/20: Acute anterolateral ST elevation MI with Culprit lesion being the 99% mid LAD lesion (just past major 1st Diag/D1) along with multivessel CAD including 99% ostial AV groove LCx and 80% calcified napkin ring proximal RCA lesion with extensive calcification throughout the RCA. ? Successful PTCA and DES PCI of the LAD crossing D1 -resolute Onyx DES 2.75 mm x 18 mm postdilated to 3.1 mm.  Mild to moderate reduced EF with  anterior anterolateral hypokinesis, EF roughly 40 to 45%.  Initial evaluation suggested normal EDP, but on recheck post PCI LVEDP of 30 mmHg, severely elevated consistent with ACUTE DIASTOLIC HEART FAILURE  Significantly dilated aortic root requiring AL-1 guide catheter for both Left and Right Coronary Angiography   TTE 10/31/2020: EF 35 to 40%. Moderate decreased function. Moderate concentric LVH GR 3 DD. Severe HK of the mid apical inferolateral and inferior wall with moderate HK of the apical septal and anterior wall. PA pressure estimated 43 mmHg. Severe LA dilation. Moderate RA dilation. Moderate to severe MR. Aortic root dilated at 41 mm. RV PSP 15 mmHg.   LFemV Central [facility] by [facility] 11/21  This is an purposes  TEE 11/01/2020: EF 35-40% moderate decreased function. Severe LA dilation. No LAA thrombus. Moderate RA dilation. 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 Mild dilation of ascending aorta measuring 40 mm. Moderate grade 3 plaque..   11/01/2020: Right IJ dialysis catheter placed, right radial arterial line placed.  11/03/2020: CorPac placed inIR   Echo 11/26/2020: EF 45 to 50% with mildly decreased function.  Moderate concentric LVH and indeterminate filling.  Mild to moderate MR due to restricted PMV L with mitral annular calcification III B.  Does appear to be slightly worse but not severe.  Otherwise no significant study changes.  Assessment & Plan    Principal Problem:   Acute ST elevation myocardial infarction (STEMI) of anterolateral wall (HCC) Active Problems:   ESRD (end stage renal disease) ([place])   3 V- CAD w/ ACS/STEMI: Culprit = 99% mLAD @ D1 (DES PCI jailing D1); 99% ost-AVGLCx, 80% calcified napkin ring prox RCA.  Presence of drug coated stent in LAD coronary artery: Resolute Onyx DES 2.75 mm x 18 mm  (3.1 mm) at major D1 & SP1.   Acute combined systolic and diastolic heart failure (HCC)   Atrial fibrillation with RVR (HCC)   Bacteremia   Septic shock due to Staphylococcus aureus (HCC)   Pneumonia of right lower lobe due to methicillin susceptible Staphylococcus aureus (MSSA) (HCC)   Severe mitral regurgitation   Hyperlipidemia with target LDL less than 70   UGIB (upper gastrointestinal bleed)   Essential hypertension   Cardiac arrest ([place])   Acute myocardial infarction ([place])   Acute ST elevation myocardial infarction (STEMI) involving left anterior descending (LAD) coronary artery (HCC)   Pressure injury of skin   Protein-calorie malnutrition, severe   Pleural effusion   Empyema (HCC)   Chest tube in place  Principal Problem:   Acute ST elevation myocardial infarction (STEMI) of anterolateral wall (HCC) /  3 V- CAD w/ ACS/STEMI: Culprit = 99% mLAD @ D1 (DES PCI jailing D1); 99% ost-AVGLCx, 80% calcified napkin ring prox RCA. /  Presence of drug coated stent in LAD coronary artery: Resolute Onyx DES 2.75 mm x 18 mm (3.1 mm) at major D1 & SP1. PCI of LAD- with planned staged PCI of RCA (& will likely need to treat AVG Cx lesion medically (not get PCI target)-staged PCI remains on hold given MSSA bacteremia  Now back in sinus rhythm.  Hemodynamically stable.  Is on amiodarone by feeding tube.  Able to take parenteral medications and nutrition: Now on Brilinta->   When finally able to start on anticoagulation because of A. fib, would probably consider switching to Plavix and warfarin.  On high-dose atorvastatin, and recently started on IV Lopressor every 8 which can likely be converted to p.o. once we are sure that he is able to take parenteral food however cannot titrate further to allow for stable blood pressures with dialysis..    End stage renal disease on dialysis [facility]) - on HD M-W-F (required urgent cath on 11/20 PM 2/2 volume overload & Acute DHF- 4 L off).    Back on HD with  volume control by HD.    Acute combined systolic and diastolic heart failure (HCC) (complicated with Volume overload & accelerated HTN) --> initial pulmonary edema resolved with HD on Saturday post Cath. ->  Thankfully, MR seems of improved to now moderate. Alla German mitral Regurgitation  No longer on pressors, out of cardiogenic shock.  Continue volume control with HD--was unfortunately reintubated due to volume overload when HD was held.  Started on low-dose beta-blocker IV, as pressures tolerate we will need to consider reinitiating standing dose of potential beta-blocker and afterload reduction, but currently blood pressures not sufficiently elevated to allow for intermittent dialysis.  Even.  EMR is at least in part functional.  Will need aggressive volume control and afterload reduction.    Atrial fibrillation with RVR ([place]) - initially had urgent DCCV; initially back to SR shortly & retured to Afib RVR currently in 110-120s --> successful DCCV on 11/23, -> reverted back to A. fib this morning.  Heparin was stopped due to bleeding issues.  Remains on Brilinta for stent, but not on warfarin or other anticoagulation.  Maintaining sinus rhythm now on amiodarone.  Additional IV Lopressor were started.  Remains hemodynamically stable now even when out of A. fib, but I think his MRI gets worse with A. fib.  ID/Bacteremia due to Staphylococcus - MSSA; Septic shock due to Staphylococcus  aureus (HCC)/right lower lobe staph pneumonia- PCCM consult & ID counsulted -> WBC elevated Febrile to 102.5 on 11/21 PM - current T max 98.4; 2/2 BC + for MSSA   Has required intubation followed by reintubation, continues IV antibiotics with plan to treat for 6 months.  Per PCCM.  Extubated 2 days ago.  Action saturations remaining stable.  Continue diuresis.    Hyperlipidemia with target LDL less than 70: High-dose statin    Essential hypertension labile pressures now, on low-dose beta-blocker but no more  pressors.  However is requiring midodrine for dialysis.    Anemia -hemoglobin now 7.3.  Hence not on full anticoagulation..  Continue to transfuse as necessary and treat with iron plus likely respiratory in.  Defer to nephrology and clear   Overall remains stable from a cardiac standpoint.  Will likely monitor over the weekend but around Foley if necessary or if there are any changes.  Otherwise for now he is stable.  Will round again on Monday unless issues arise over the weekend.   For questions or updates, please contact [place] Please consult [email or web address] for contact info under        Signed, Glenetta Hew, MD  12/01/2020, 12:55 PM

## 2020-12-01 NOTE — Progress Notes (Signed)
Patient ID: Travis [name], male   DOB: July 31, 1961, 59 y.o.   MRN: [id number]  [facility] Progress Note   Assessment/ Plan:   1.  Acute hypoxic respiratory failure: Successfully extubated 2 days ago and remains on supplemental oxygen via nasal cannula.  Continue efforts at ultrafiltration/volume management with hemodialysis-this has been challenging due to relative hypotension (he is on maximum dose midodrine with as needed albumin).  Previously with significant right parapneumonic effusion complicating MSSA cavitary pneumonia.  On intravenous Ancef. 2. ESRD: Usually on hemodialysis on a Monday/Wednesday/Friday schedule your previously on CRRT.  Heparin not being used due to significant ABLA/GI bleed recently.  Continue efforts at ultrafiltration. 3. Anemia: Multifactorial with acute illness and now likely complicated by malnutrition/inflammatory complex with sacral decubitus.  Continue ESA with as needed iron.  Recently worsened GI bleed secondary to duodenal ulcers and esophagitis.  Currently on sucralfate-we will need to stop this as soon as possible to limit aluminum toxicity. 4. CKD-MBD: Calcium and phosphorus level currently at goal, continue strategies to optimize nutrition while on sevelamer 3 times daily AC. 5. Nutrition: Ongoing tube feeds at this time with protein supplementation to help improve healing of large sacral decubitus. 6.  History of acute STEMI: Status post PCI to LAD in 10/2019.  Started on Brilinta due to multifocal/stable disease. 7.  Sacral decubitus: Wound care with optimization of nutritional status.  Subjective:   Without acute events overnight, he had hypotension upon initiating dialysis and has received albumin bolus.   Objective:   BP (!) 81/50   Pulse 61   Temp 97.6 F (36.4 C)   Resp (!) 35   Ht 5' 11"  (1.803 m)   Wt 67.7 kg   SpO2 97%   BMI 20.82 kg/m   Physical Exam: Gen: Appears fatigued resting in bed, on oxygen via nasal  cannula CVS: Pulse regular rhythm, normal rate, S1 and S2 normal without murmur/rub or gallop Resp: Decreased breath sounds over bases with fine rales left base Abd: Soft, nondistended, bowel sounds normal Ext: 1+ bilateral pitting edema.  Labs: BMET Recent Labs  Lab 11/26/20 0043 11/26/20 1750 11/26/20 1827 11/27/20 0518 11/27/20 1723 11/28/20 0504 11/28/20 1034 11/29/20 0057 11/30/20 0040 12/01/20 0732  NA 135  --  137 134*  --  134* 133* 134* 133* 132*  K 4.2  --  3.9 4.0  --  2.6* 2.9* 3.8 3.7 4.5  CL 96*  --   --  96*  --  94* 92* 95* 96* 92*  CO2 30  --   --  28  --  28 27 25 25 25   GLUCOSE 82  --   --  84  --  81 91 128* 136* 138*  BUN 40*  --   --  29*  --  24* 26* 40* 42* 82*  CREATININE 6.10*  --   --  5.81*  --  5.25* 5.71* 6.88* 4.92* 7.01*  CALCIUM 9.1  --   --  9.6  --  9.2 9.3 9.1 9.5 9.7  PHOS  --  4.7*  --  5.2* 3.0 3.6  --  4.3 3.4 4.1   CBC Recent Labs  Lab 11/28/20 0504 11/29/20 0057 11/30/20 0040 12/01/20 0709  WBC 15.9* 22.2* 20.7* 20.9*  HGB 7.4* 8.8* 7.7* 7.3*  HCT 22.9* 27.3* 25.0* 23.7*  MCV 91.6 91.9 93.6 91.9  PLT 371 429* 407* 405*      Medications:    . sodium chloride   Intravenous  Once  . sodium chloride   Intravenous Once  . amiodarone  200 mg Per Tube BID  . atorvastatin  80 mg Per Tube q1800  . chlorhexidine  15 mL Mouth Rinse BID  . chlorhexidine gluconate (MEDLINE KIT)  15 mL Mouth Rinse BID  . Chlorhexidine Gluconate Cloth  6 each Topical Q0600  . Chlorhexidine Gluconate Cloth  6 each Topical Q0600  . collagenase   Topical Daily  . [START ON 12/02/2020] darbepoetin (ARANESP) injection - DIALYSIS  60 mcg Intravenous Q Sat-HD  . docusate  100 mg Per Tube BID  . doxercalciferol      . doxercalciferol  5 mcg Intravenous Q M,W,F  . feeding supplement (PROSource TF)  90 mL Per Tube QID  . guaiFENesin  15 mL Per Tube Q4H  . heparin injection (subcutaneous)  5,000 Units Subcutaneous Q8H  . lactobacillus acidophilus  1 tablet  Per Tube TID  . mouth rinse  15 mL Mouth Rinse 10 times per day  . melatonin  5 mg Per Tube QHS  . metoprolol tartrate  5 mg Intravenous Q8H  . midodrine  20 mg Per Tube TID WC  . multivitamin  1 tablet Per Tube QHS  . nutrition supplement (JUVEN)  1 packet Per Tube BID BM  . pantoprazole sodium  40 mg Per Tube BID  . polyethylene glycol  17 g Per Tube Daily  . QUEtiapine  100 mg Per Tube QHS  . sevelamer carbonate  1.6 g Per Tube TID WC  . sodium chloride flush  10 mL Intracatheter Q8H  . sodium chloride flush  10-40 mL Intracatheter Q12H  . sodium chloride flush  3 mL Intravenous Q12H  . sucralfate  1 g Per Tube QID  . ticagrelor  90 mg Per Tube BID   Elmarie Shiley, MD 12/01/2020, 8:20 AM

## 2020-12-01 NOTE — Plan of Care (Signed)
  Problem: Cardiovascular: Goal: Ability to achieve and maintain adequate cardiovascular perfusion will improve Outcome: Progressing   Problem: Clinical Measurements: Goal: Ability to maintain clinical measurements within normal limits will improve Outcome: Progressing Goal: Will remain free from infection Outcome: Progressing Goal: Diagnostic test results will improve Outcome: Progressing Goal: Respiratory complications will improve Outcome: Progressing Goal: Cardiovascular complication will be avoided Outcome: Progressing   Problem: Nutrition: Goal: Adequate nutrition will be maintained Outcome: Progressing   Problem: Pain Managment: Goal: General experience of comfort will improve Outcome: Progressing   Problem: Safety: Goal: Ability to remain free from injury will improve Outcome: Progressing   Problem: Skin Integrity: Goal: Risk for impaired skin integrity will decrease Outcome: Progressing

## 2020-12-01 NOTE — Evaluation (Signed)
Clinical/Bedside Swallow Evaluation Patient Details  Name: Travis [name] MRN: [id number] Date of Birth: 10/26/1961  Today's Date: 12/01/2020 Time: SLP Start Time (ACUTE ONLY): 1545 SLP Stop Time (ACUTE ONLY): 1605 SLP Time Calculation (min) (ACUTE ONLY): 20 min  Past Medical History:  Past Medical History:  Diagnosis Date  . Acute ST elevation myocardial infarction (STEMI) of anterolateral wall ([place]) 10/28/2020   Culprit lesion, 99% ulcerated mid LAD at D1 -> DES PCI  . Anemia of chronic disease 01/30/2015  . Coronary artery disease involving native coronary artery of native heart with unstable angina pectoris ([place]) 10/28/2020   Acute anterolateral STEMI: 3V CAD: Culprit Lesion = 99% mid LAD lesion (just after D1) - DES PCI; 99% ostial AV groove LCx; & 80% calcified napkin ring proximal RCA lesion with extensive calcification throughout the RCA. Successful PTCA and DES PCI of the LAD crossing D1 -resolute Onyx DES 2.75 mm x 18 mm postdilated to 3.1 mm. With PCI, there  . End-stage renal disease on hemodialysis ([place])   . GERD (gastroesophageal reflux disease)    takes Nexium  . Hyperlipidemia with target LDL less than 70 10/28/2020  . Hypertension   . Pneumonia    as a child  . Presence of drug coated stent in LAD coronary artery 10/28/2020   Proximal-mid LAD 99% ->0% -> Resolute Onyx DES 2.75 mm x 18 mm (3.1 mm)  . Sleep apnea    uses c-pap 2 yrs   Past Surgical History:  Past Surgical History:  Procedure Laterality Date  . AV FISTULA PLACEMENT Left 12/22/2017   Procedure: REPAIR PSEUDOANEURYSM ARTERIOVENOUS (AV) FISTULA;  Surgeon: [name], MD;  Location: [place];  Service: Vascular;  Laterality: Left;  . BASCILIC VEIN TRANSPOSITION Left 01/05/2014   Procedure: LEFT 1ST STAGE BASCILIC VEIN TRANSPOSITION;  Surgeon: Conrad Baker, MD;  Location: [place];  Service: Vascular;  Laterality: Left;  . BASCILIC VEIN TRANSPOSITION Left 07/26/2014   Procedure: Left Arm Brachial Vein  Transposition Second Stage;  Surgeon: Conrad Mat[place] Green, MD;  Location: [place];  Service: Vascular;  Laterality: Left;  . COLONOSCOPY    . COLONOSCOPY N/A 02/02/2015   Procedure: COLONOSCOPY;  Surgeon: [name], MD;  Location: [facility] ENDOSCOPY;  Service: Endoscopy;  Laterality: N/A;  . CORONARY STENT INTERVENTION N/A 10/28/2020   Procedure: CORONARY STENT INTERVENTION;  Surgeon: Leonie Man, MD;  Location: [place] CV LAB;  Service: Cardiovascular;  Laterality: N/A;  . CORONARY/GRAFT ACUTE MI REVASCULARIZATION N/A 10/28/2020   Procedure: Coronary/Graft Acute MI Revascularization;  Surgeon: Leonie Man, MD;  Location: [place] CV LAB;  Service: Cardiovascular;  Laterality: N/A;  . ESOPHAGOGASTRODUODENOSCOPY Left 01/31/2015   Procedure: ESOPHAGOGASTRODUODENOSCOPY (EGD);  Surgeon: [name], MD;  Location: [facility] ENDOSCOPY;  Service: Endoscopy;  Laterality: Left;  . ESOPHAGOGASTRODUODENOSCOPY N/A 11/09/2020   Procedure: ESOPHAGOGASTRODUODENOSCOPY (EGD);  Surgeon: Clarene Essex, MD;  Location: [place];  Service: Endoscopy;  Laterality: N/A;  . EVALUATION UNDER ANESTHESIA WITH HEMORRHOIDECTOMY N/A 02/26/2018   Procedure: ANORECTAL EXAM UNDER ANESTHESIA  HEMORRHOIDECTOMY x 2  HEMORRHOIDAL LIGATION AND PEXY;  Surgeon: Yousif Boston, MD;  Location: [facility];  Service: General;  Laterality: N/A;  . GIVENS CAPSULE STUDY N/A 01/31/2015   Procedure: GIVENS CAPSULE STUDY;  Surgeon: [name], MD;  Location: [facility] ENDOSCOPY;  Service: Endoscopy;  Laterality: N/A;  . IR AV DIALY SHUNT INTRO NEEDLE/INTRAC INITIAL W/PTA/STENT/IMG LT Left 11/20/2020  . IR US GUIDE VASC ACCESS LEFT  11/20/2020  . LEFT HEART CATH AND  CORONARY ANGIOGRAPHY N/A 10/28/2020   Procedure: LEFT HEART CATH AND CORONARY ANGIOGRAPHY;  Surgeon: Leonie Man, MD;  Location: [place] CV LAB;  Service: Cardiovascular;  Laterality: N/A;  . MOUTH SURGERY     teeth cleaning  . NEPHRECTOMY RADICAL Right 05/2015   [facility] Dr [name] for  R renal mass   HPI:  Pt is a 59 yo male presenting with anterior STEMI. Hospital course prolonged and complicated by afib with RVR requiring cardioversion x2, septic shock due to MSSA bacteremia, brief PEA arrest requiring CPR, acute hypoxic respiratory failure requiring intubation (11/20-12/3), upper GI bleed with acute blood loss anemia due to severe esophagitis requiring PRBC transfusion, severe metabolic encephalopathy/ICU delirium, bilateral lung abscesses with empyema requiring chest tube insertion. MBS 12/7 without any aspiration, but given mentation and level of weakness was started on Dys 1 diet and nectar thick liquids, transitioning back to regular solids and thin liquids. Swallowing evaluation was reordered after pt required reintubation 12/19-12/22.   Assessment / Plan / Recommendation Clinical Impression  Pt presents with functional appearing swallowing function, with voice only mildly soft post-extubation two days ago. He had one cough after PO trials had stopped that was productive only of what appeared to be a small amount of blood-tinged mucus. Recommend starting Dys 3 diet and thin liquids with meds whole in puree as a precaution in light of his respiratory status, although this has improved since yesterday per RN. SLP will f/u for tolerance and potential to advance. SLP Visit Diagnosis: Dysphagia, unspecified (R13.10)    Aspiration Risk  Mild aspiration risk    Diet Recommendation Dysphagia 3 (Mech soft);Thin liquid   Liquid Administration via: Cup;Straw Medication Administration: Whole meds with puree Supervision: Staff to assist with self feeding Compensations: Slow rate;Small sips/bites Postural Changes: Seated upright at 90 degrees;Remain upright for at least 30 minutes after po intake    Other  Recommendations Oral Care Recommendations: Oral care BID   Follow up Recommendations  (tba)      Frequency and Duration min 2x/week  2 weeks       Prognosis Prognosis  for Safe Diet Advancement: Good      Swallow Study   General HPI: Pt is a 59 yo male presenting with anterior STEMI. Hospital course prolonged and complicated by afib with RVR requiring cardioversion x2, septic shock due to MSSA bacteremia, brief PEA arrest requiring CPR, acute hypoxic respiratory failure requiring intubation (11/20-12/3), upper GI bleed with acute blood loss anemia due to severe esophagitis requiring PRBC transfusion, severe metabolic encephalopathy/ICU delirium, bilateral lung abscesses with empyema requiring chest tube insertion. MBS 12/7 without any aspiration, but given mentation and level of weakness was started on Dys 1 diet and nectar thick liquids, transitioning back to regular solids and thin liquids. Swallowing evaluation was reordered after pt required reintubation 12/19-12/22. Type of Study: Bedside Swallow Evaluation Previous Swallow Assessment: BSE on 11/11/20 Diet Prior to this Study: NPO;NG Tube Temperature Spikes Noted: No Respiratory Status: Nasal cannula History of Recent Intubation: Yes Length of Intubations (days): 3 days Date extubated: 11/29/20 (second intubation) Behavior/Cognition: Alert;Cooperative;Requires cueing Oral Cavity Assessment: Within Functional Limits Oral Care Completed by SLP: No Oral Cavity - Dentition: Adequate natural dentition Vision: Functional for self-feeding Self-Feeding Abilities: Able to feed self Patient Positioning: Upright in bed;Other (comment) (on his side due to sacral wound but upright) Baseline Vocal Quality: Low vocal intensity (mild) Volitional Swallow: Able to elicit    Oral/Motor/Sensory Function Overall Oral Motor/Sensory Function: Within functional limits  Ice Chips Ice chips: Not tested   Thin Liquid Thin Liquid: Within functional limits Presentation: Self Fed;Straw    Nectar Thick Nectar Thick Liquid: Not tested   Honey Thick Honey Thick Liquid: Not tested   Puree Puree: Within functional  limits Presentation: Spoon   Solid     Solid: Within functional limits Presentation: Self Fed      Osie Bond., M.A. [place] Pager [phone] Office [phone]  12/01/2020,4:32 PM

## 2020-12-01 NOTE — Procedures (Signed)
Patient seen on Hemodialysis. BP (!) 81/50   Pulse 61   Temp 97.6 F (36.4 C)   Resp (!) 35   Ht 5\' 11"  (1.803 m)   Wt 67.7 kg   SpO2 97%   BMI 20.82 kg/m   QB 400, UF goal 2.5L Tolerating treatment without complaints at this time. S/P albumin bolus for hypotension.   Elmarie Shiley MD [facility]. Office # [phone] Pager # [phone] 8:19 AM

## 2020-12-01 NOTE — Progress Notes (Signed)
PT Cancellation Note  Patient Details Name: Travis [name] MRN: [id number] DOB: 08/22/1961   Cancelled Treatment:    Reason Eval/Treat Not Completed: (P) Patient at procedure or test/unavailable Pt on hemodialysis. PT will follow back as able for treatment.   Myley Bahner B. Migdalia Dk PT, DPT Acute Rehabilitation Services Pager [phone] Office [phone]   [place] 12/01/2020, 11:02 AM

## 2020-12-02 ENCOUNTER — Inpatient Hospital Stay (HOSPITAL_COMMUNITY): Payer: Medicare Other

## 2020-12-02 DIAGNOSIS — I2109 ST elevation (STEMI) myocardial infarction involving other coronary artery of anterior wall: Secondary | ICD-10-CM | POA: Diagnosis not present

## 2020-12-02 LAB — CBC
HCT: 22.7 % — ABNORMAL LOW (ref 39.0–52.0)
Hemoglobin: 7.3 g/dL — ABNORMAL LOW (ref 13.0–17.0)
MCH: 29.2 pg (ref 26.0–34.0)
MCHC: 32.2 g/dL (ref 30.0–36.0)
MCV: 90.8 fL (ref 80.0–100.0)
Platelets: 423 10*3/uL — ABNORMAL HIGH (ref 150–400)
RBC: 2.5 MIL/uL — ABNORMAL LOW (ref 4.22–5.81)
RDW: 14.2 % (ref 11.5–15.5)
WBC: 17.5 10*3/uL — ABNORMAL HIGH (ref 4.0–10.5)
nRBC: 0 % (ref 0.0–0.2)

## 2020-12-02 LAB — RENAL FUNCTION PANEL
Albumin: 1.9 g/dL — ABNORMAL LOW (ref 3.5–5.0)
Anion gap: 14 (ref 5–15)
BUN: 66 mg/dL — ABNORMAL HIGH (ref 6–20)
CO2: 27 mmol/L (ref 22–32)
Calcium: 10.1 mg/dL (ref 8.9–10.3)
Chloride: 92 mmol/L — ABNORMAL LOW (ref 98–111)
Creatinine, Ser: 5.44 mg/dL — ABNORMAL HIGH (ref 0.61–1.24)
GFR, Estimated: 11 mL/min — ABNORMAL LOW (ref >60)
Glucose, Bld: 91 mg/dL (ref 70–99)
Phosphorus: 3.7 mg/dL (ref 2.5–4.6)
Potassium: 4.3 mmol/L (ref 3.5–5.1)
Sodium: 133 mmol/L — ABNORMAL LOW (ref 135–145)

## 2020-12-02 LAB — GLUCOSE, CAPILLARY
Glucose-Capillary: 84 mg/dL (ref 70–99)
Glucose-Capillary: 119 mg/dL — ABNORMAL HIGH (ref 70–99)
Glucose-Capillary: 92 mg/dL (ref 70–99)
Glucose-Capillary: 99 mg/dL (ref 70–99)
Glucose-Capillary: 122 mg/dL — ABNORMAL HIGH (ref 70–99)
Glucose-Capillary: 101 mg/dL — ABNORMAL HIGH (ref 70–99)

## 2020-12-02 LAB — MAGNESIUM: Magnesium: 1.8 mg/dL (ref 1.7–2.4)

## 2020-12-02 MED ORDER — DARBEPOETIN ALFA 60 MCG/0.3ML IJ SOSY
60.0000 ug | PREFILLED_SYRINGE | INTRAMUSCULAR | Status: DC
  Filled 2020-12-02: qty 0.3

## 2020-12-02 MED ORDER — ORAL CARE MOUTH RINSE
15.0000 mL | Freq: Two times a day (BID) | OROMUCOSAL | Status: DC
  Administered 2020-12-02 – 2020-12-14 (×15): 15 mL via OROMUCOSAL

## 2020-12-02 NOTE — Plan of Care (Signed)
  Problem: Education: Goal: Understanding of CV disease, CV risk reduction, and recovery process will improve Outcome: Progressing   Problem: Activity: Goal: Ability to return to baseline activity level will improve Outcome: Progressing   Problem: Cardiovascular: Goal: Ability to achieve and maintain adequate cardiovascular perfusion will improve Outcome: Progressing   Problem: Health Behavior/Discharge Planning: Goal: Ability to safely manage health-related needs after discharge will improve Outcome: Progressing   Problem: Education: Goal: Knowledge of General Education information will improve Description: Including pain rating scale, medication(s)/side effects and non-pharmacologic comfort measures Outcome: Progressing   Problem: Health Behavior/Discharge Planning: Goal: Ability to manage health-related needs will improve Outcome: Progressing   Problem: Clinical Measurements: Goal: Ability to maintain clinical measurements within normal limits will improve Outcome: Progressing Goal: Will remain free from infection Outcome: Progressing Goal: Diagnostic test results will improve Outcome: Progressing Goal: Respiratory complications will improve Outcome: Progressing Goal: Cardiovascular complication will be avoided Outcome: Progressing   Problem: Activity: Goal: Risk for activity intolerance will decrease Outcome: Progressing   Problem: Nutrition: Goal: Adequate nutrition will be maintained Outcome: Progressing   Problem: Coping: Goal: Level of anxiety will decrease Outcome: Progressing   Problem: Elimination: Goal: Will not experience complications related to bowel motility Outcome: Progressing   Problem: Pain Managment: Goal: General experience of comfort will improve Outcome: Progressing   Problem: Safety: Goal: Ability to remain free from injury will improve Outcome: Progressing   Problem: Skin Integrity: Goal: Risk for impaired skin integrity will  decrease Outcome: Progressing

## 2020-12-02 NOTE — Progress Notes (Signed)
NAME:  Travis [name], MRN:  [id number], DOB:  [birth year], LOS: 14 ADMISSION DATE:  10/28/2020, CONSULTATION DATE:  10/29/20 REFERRING MD:  Cardiology - [name], CHIEF COMPLAINT:  Hypotension, gram positive cocci on culture, AMS  Brief History   Patient is a 59 y.o. male with history of ESRD, HTN, CAD-who presented with anterior STEMI-treated with DES to LAD-hospital course prolonged and complicated by atrial fibrillation with RVR requiring cardioversion x2,, septic shock due to MSSA bacteremia, brief PEA arrest requiring CPR, acute hypoxic respiratory failure requiring intubation, upper GI bleed with acute blood loss anemia due to severe esophagitis requiring PRBC transfusion, severe metabolic encephalopathy/ICU delirium, bilateral lung abscesses with empyema requiring chest tube insertion   Past Medical History  HTN ESRD, HD MWF  Significant Hospital Events   11/20 STEMI-stent LAD.  11/21 Afib with RVR, s.p cardioversion  11/23 S/P DCCV of recurrent Afib. Intubated. Septic PNA and cardiogenic shock.  11/24 Brief brady arrest ADD: Start CRRT.  11/30 Stool output from mouth> Ileus.  12/2 EGD: Esophagitis. No bleeding and probable ischemic duodenitis  12/3 extubated.  12/4 CRRT d/c'd. Delirious  12/5 iHD  12/9 R CT inserted for pleural effusion.  12/11 Given a course of TPA and lytics via chest tube 12/12 Tx to [facility],  12/13 Pt turned over and pulled chest tube and there is leak, stat CXR ordered.  12/14 Chest tube replaced. Readmit to ICU with worsening pleural effusion and hypotension. Levo started.  12/19 Txf back to ICU for resp failure, failed bipap and was intubated.  12/22 Extubated.  Has tenuous respiratory status requiring BiPAP 12/23 Multidisciplinary goals of care meeting.  Remains full code. 12/25 Remains on Precedex due to anxiety, delirium.  Consults:  PCCM  Nephrology ID  Procedures:  Central line 11/21 >> 11/29 Arterial line 11/21 out Repeat DCCV 11/23 ETT  11/23 > 12/3 ETT 12/12 > 12/22 HD line 11/24 RIJ >> Arterial line 11/24 out L [place] CVC 11/30>>  Right-sided chest tube 12/14>> 12/19  Significant Diagnostic Tests:   11/24 TEE>>Left ventricular ejection fraction, by estimation, is 35 to 40%. The  left ventricle has moderately decreased function.Moderate to severe mitral regurgitation.  11/23 CT Chest>>Extensive multifocal nodular and patchy airspace disease in both lungs with a slight peripheral predominance in the upper lungs. 3.1 cm nodular consolidative opacity in the right upper lobe is cavitated. Imaging features likely related to multifocal pneumonia. Septic emboli and metastatic disease considered less likely but not excluded.  11/22 LUE Vas Upper Extremity Doppler>> Arteriovenous fistula-Aneurysmal dilatation noted.  11/24 CT abdomen - no retroperitoneal hematoma  11/29 MR Brain>> No evidence acute intracranial abnormality, sinisitis, mastoid effusions  11/29>> MR Cervical Spine>> Given the provided history of bacteremia, facet joint septic arthritis is difficult to definitively exclude, but the lack of any surrounding marrow edema argues against this Cervical spondylosis   11/29 MR Thoracic Spine No significant marrow edema in thoracic spine , Mild thoracic spondylosis hypointense marrow signal throughout the thoracic spine, likely related to the patient's end-stage renal disease. multifocal airspace disease and cavitary pulmonary lesions. Small right pleural effusion.  11/29 MR Lumbar Spine:As noted above and Suspected cholecystolithiasis  12/11 CT chest  improving air space disease - R empyema persists but adequate chest tube position.    Micro Data:  11/21 BCx2>>Staph aureus > MSSA by BCID 11/22 BCx 2 >> neg 11/23 trach asp >> staph aureus 11/24 BCx2>> neg 11/30 BC x 2>> staph epi>> MRSA 12/2/ BCx 2 >> neg 12/9 right  pleural fluid >> negative  Antimicrobials:  Zosyn 11/21 x 1 Ancef 11/21 ->   Interim  history/subjective:   Continues on Precedex.  Tolerated ultrafiltration of 3 L yesterday.  Off BiPAP On high flow nasal cannula at 9 L  Objective   Blood pressure 122/76, pulse 76, temperature 98.9 F (37.2 C), temperature source Oral, resp. rate (!) 22, height 5\' 11"  (1.803 m), weight 77 kg, SpO2 98 %.        Intake/Output Summary (Last 24 hours) at 12/02/2020 0812 Last data filed at 12/02/2020 0700 Gross per 24 hour  Intake 813.96 ml  Output 3400 ml  Net -2586.04 ml   Filed Weights   12/01/20 0715 12/01/20 1104 12/02/20 0234  Weight: 67.7 kg 64.9 kg 77 kg   Physical exam  Gen:      No acute distress HEENT:  EOMI, sclera anicteric Neck:     No masses; no thyromegaly Lungs:    Scattered crackles CV:         Regular rate and rhythm; no murmurs Abd:      + bowel sounds; soft, non-tender; no palpable masses, no distension Ext:    No edema; adequate peripheral perfusion Skin:      Warm and dry; no rash Neuro: Awake, talkative. No focal deficits  Labs/imaging personally reviewed Significant for BUN/creatinine 66/5.44 WBC 17.5, hemoglobin 7.3, platelets 423 X-ray today with with improvement in bilateral airspace opacities  Resolved Hospital Problem list   Parapneumonic effusion status post chest tube placement with DNase and TPA Chest tube removed 12/19  Assessment & Plan:  Acute hypoxemic respiratory failure Likely secondary to volume overload -CTA chest shows bilateral groundglass opacities consistent with likely pulmonary edema  P: Tolerated extubation morning of 12/22 Continues to have tenuous respiratory status requiring intermittent BiPAP Anxiety is a major component and he remains on Precedex drip If he is reintubated then will need a tracheostomy.  ESRD on HD- MWF P: Continue hemodialysis per nephrology  Anterior ST elevation MI s/p DES 11/20 Ischemic cardiomyopathy with an ejection fraction of 40 to 45% Paroxysmal atrial fibrillation -Anticoagulation on  hold due to recent GI bleed Mitral regurgitation P: Cardiology following, appreciate assistance Continue Brilinta, statin, and amiodarone Remains not a candidate for anticoagulation given recent GI bleed Continue antiplatelet therapy Will need distal RCA intervention once stabilized Continuous telemetry  MSSA bacteremia -Seen with multidrug-resistant Staph epidermidis P: Evaluated by infectious disease during hospitalization, recommendations made for prolonged antibiotic coverage Continue cefazolin x6 weeks with stop date in place  Unstageable sacral ulcer P: Continue hydrotherapy as able WOC consulted Pressure alleviating devices Frequent every 2 hours turns  Generalized deconditioning P: PT/OT as able  Anemia of chronic illness Anemia of critical illness Recent GI bleed  -status post EGD showing severe esophagitis and duodenal ulceration P: Trend CBC Transfuse per protocol Hemoglobin goal greater than 8   Best practice (evaluated daily)  Diet: Tube feeds Pain/Anxiety/Delirium protocol (if indicated): Precedex VAP protocol (if indicated): Needed DVT prophylaxis: Subcutaneous heparin GI prophylaxis: Protonix Glucose control: Monitor Mobility: Bed Disposition: ICU  Goals of Care:  Last date of multidisciplinary goals of care discussion: 12/25 Family and staff present: Re dicussed with Wife, patient and nurse Summary of discussion: Family wants to continue aggressive care. Do not want to transfer out of ICU unless stable as he has several bounce backs  Follow up goals of care discussion due: 1/1 Code Status: Full  Critical care:   The patient is critically ill with multiple organ  system failure and requires high complexity decision making for assessment and support, frequent evaluation and titration of therapies, advanced monitoring, review of radiographic studies and interpretation of complex data.   Critical Care Time devoted to patient care services,  exclusive of separately billable procedures, described in this note is 35  minutes.   Marshell Garfinkel MD [facility] Please see [email or web address] for pager details.  12/02/2020, 8:12 AM

## 2020-12-02 NOTE — Progress Notes (Signed)
Patient ID: Travis [name], male   DOB: 1961-05-30, 59 y.o.   MRN: [id number]  [facility] Progress Note   Assessment/ Plan:   1.  Acute hypoxic respiratory failure: Extubated on 12/22 and remains on supplemental oxygen via nasal cannula. He was able to tolerate 3 L ultrafiltration yesterday with dialysis (on midodrine/as needed albumin). He has a right parapneumonic effusion complicating MSSA cavitary pneumonia and is on intravenous Ancef. Encouraged ongoing PT/OT. 2. ESRD: Usually on hemodialysis on a Monday/Wednesday/Friday schedule and was previously on CRRT; underwent last dialysis yesterday.  Heparin not being used due to significant ABLA/GI bleed recently.  Continue efforts at ultrafiltration with subsequent dialysis treatments. 3. Anemia: Multifactorial with acute illness and now likely complicated by malnutrition/inflammatory complex with sacral decubitus.  Continue ESA with as needed iron.  Recently worsened GI bleed secondary to duodenal ulcers and esophagitis.  Currently on sucralfate-we will need to stop this as soon as possible to limit aluminum toxicity. 4. CKD-MBD: Calcium and phosphorus level currently at goal, continue strategies to optimize nutrition while on sevelamer 3 times daily AC. 5. Nutrition: Ongoing tube feeds at this time with protein supplementation to help improve healing of large sacral decubitus. 6.  History of acute STEMI: Status post PCI to LAD in 10/2019.  Started on Brilinta due to multifocal/stable disease. 7.  Sacral decubitus (unstageable): Wound care with hydrotherapy and pressure alleviating devices. Maintain optimization of nutritional status to help wound healing.  Subjective:   Without acute events overnight, tolerated 3 L ultrafiltration with dialysis yesterday.   Objective:   BP 113/65   Pulse 72   Temp 99 F (37.2 C) (Axillary)   Resp (!) 28   Ht 5' 11"  (1.803 m)   Wt 77 kg   SpO2 96%   BMI 23.68 kg/m   Physical Exam: Gen:  Appears more awake/alert and conversant this morning. Wife at bedside feeding him breakfast CVS: Pulse regular rhythm, normal rate, S1 and S2 normal without murmur/rub or gallop Resp: Creased breath sounds over bases-poor inspiratory effort. No distinct rales Abd: Soft, nondistended, bowel sounds normal Ext: 1+ bilateral pitting edema.  Labs: BMET Recent Labs  Lab 11/27/20 0518 11/27/20 1723 11/28/20 0504 11/28/20 1034 11/29/20 0057 11/30/20 0040 12/01/20 0732 12/02/20 0335  NA 134*  --  134* 133* 134* 133* 132* 133*  K 4.0  --  2.6* 2.9* 3.8 3.7 4.5 4.3  CL 96*  --  94* 92* 95* 96* 92* 92*  CO2 28  --  28 27 25 25 25 27   GLUCOSE 84  --  81 91 128* 136* 138* 91  BUN 29*  --  24* 26* 40* 42* 82* 66*  CREATININE 5.81*  --  5.25* 5.71* 6.88* 4.92* 7.01* 5.44*  CALCIUM 9.6  --  9.2 9.3 9.1 9.5 9.7 10.1  PHOS 5.2* 3.0 3.6  --  4.3 3.4 4.1 3.7   CBC Recent Labs  Lab 11/29/20 0057 11/30/20 0040 12/01/20 0709 12/02/20 0335  WBC 22.2* 20.7* 20.9* 17.5*  HGB 8.8* 7.7* 7.3* 7.3*  HCT 27.3* 25.0* 23.7* 22.7*  MCV 91.9 93.6 91.9 90.8  PLT 429* 407* 405* 423*      Medications:    . sodium chloride   Intravenous Once  . sodium chloride   Intravenous Once  . amiodarone  200 mg Per Tube BID  . atorvastatin  80 mg Per Tube q1800  . chlorhexidine  15 mL Mouth Rinse BID  . chlorhexidine gluconate (MEDLINE KIT)  15  mL Mouth Rinse BID  . Chlorhexidine Gluconate Cloth  6 each Topical Q0600  . Chlorhexidine Gluconate Cloth  6 each Topical Q0600  . collagenase   Topical Daily  . darbepoetin (ARANESP) injection - DIALYSIS  60 mcg Intravenous Q Sat-HD  . docusate  100 mg Per Tube BID  . doxercalciferol  5 mcg Intravenous Q M,W,F  . feeding supplement (PROSource TF)  90 mL Per Tube QID  . guaiFENesin  15 mL Per Tube Q4H  . heparin injection (subcutaneous)  5,000 Units Subcutaneous Q8H  . lactobacillus acidophilus  1 tablet Per Tube TID  . mouth rinse  15 mL Mouth Rinse 10 times per  day  . melatonin  5 mg Per Tube QHS  . metoprolol tartrate  5 mg Intravenous Q8H  . midodrine  20 mg Per Tube TID WC  . multivitamin  1 tablet Per Tube QHS  . nutrition supplement (JUVEN)  1 packet Per Tube BID BM  . pantoprazole sodium  40 mg Per Tube BID  . polyethylene glycol  17 g Per Tube Daily  . QUEtiapine  100 mg Per Tube QHS  . sevelamer carbonate  1.6 g Per Tube TID WC  . sodium chloride flush  10 mL Intracatheter Q8H  . sodium chloride flush  10-40 mL Intracatheter Q12H  . sodium chloride flush  3 mL Intravenous Q12H  . sucralfate  1 g Per Tube QID  . ticagrelor  90 mg Per Tube BID   Elmarie Shiley, MD 12/02/2020, 7:02 AM

## 2020-12-03 DIAGNOSIS — I2109 ST elevation (STEMI) myocardial infarction involving other coronary artery of anterior wall: Secondary | ICD-10-CM | POA: Diagnosis not present

## 2020-12-03 LAB — BASIC METABOLIC PANEL
Anion gap: 16 — ABNORMAL HIGH (ref 5–15)
BUN: 100 mg/dL — ABNORMAL HIGH (ref 6–20)
CO2: 24 mmol/L (ref 22–32)
Calcium: 10.3 mg/dL (ref 8.9–10.3)
Chloride: 91 mmol/L — ABNORMAL LOW (ref 98–111)
Creatinine, Ser: 7.13 mg/dL — ABNORMAL HIGH (ref 0.61–1.24)
GFR, Estimated: 8 mL/min — ABNORMAL LOW (ref >60)
Glucose, Bld: 97 mg/dL (ref 70–99)
Potassium: 4.6 mmol/L (ref 3.5–5.1)
Sodium: 131 mmol/L — ABNORMAL LOW (ref 135–145)

## 2020-12-03 LAB — CBC
HCT: 23.4 % — ABNORMAL LOW (ref 39.0–52.0)
Hemoglobin: 7.1 g/dL — ABNORMAL LOW (ref 13.0–17.0)
MCH: 28.4 pg (ref 26.0–34.0)
MCHC: 30.3 g/dL (ref 30.0–36.0)
MCV: 93.6 fL (ref 80.0–100.0)
Platelets: 450 10*3/uL — ABNORMAL HIGH (ref 150–400)
RBC: 2.5 MIL/uL — ABNORMAL LOW (ref 4.22–5.81)
RDW: 14.2 % (ref 11.5–15.5)
WBC: 19.2 10*3/uL — ABNORMAL HIGH (ref 4.0–10.5)
nRBC: 0 % (ref 0.0–0.2)

## 2020-12-03 LAB — PHOSPHORUS: Phosphorus: 4.7 mg/dL — ABNORMAL HIGH (ref 2.5–4.6)

## 2020-12-03 LAB — ALBUMIN: Albumin: 1.9 g/dL — ABNORMAL LOW (ref 3.5–5.0)

## 2020-12-03 LAB — GLUCOSE, CAPILLARY
Glucose-Capillary: 102 mg/dL — ABNORMAL HIGH (ref 70–99)
Glucose-Capillary: 76 mg/dL (ref 70–99)
Glucose-Capillary: 103 mg/dL — ABNORMAL HIGH (ref 70–99)
Glucose-Capillary: 111 mg/dL — ABNORMAL HIGH (ref 70–99)
Glucose-Capillary: 99 mg/dL (ref 70–99)

## 2020-12-03 LAB — MAGNESIUM: Magnesium: 2 mg/dL (ref 1.7–2.4)

## 2020-12-03 MED ORDER — DEXMEDETOMIDINE HCL IN NACL 400 MCG/100ML IV SOLN
0.2000 ug/kg/h | INTRAVENOUS | Status: AC
  Administered 2020-12-03: 21:00:00 0.4 ug/kg/h via INTRAVENOUS

## 2020-12-03 MED ORDER — ZOLPIDEM TARTRATE 5 MG PO TABS
10.0000 mg | ORAL_TABLET | Freq: Every day | ORAL | Status: AC
  Administered 2020-12-03: 22:00:00 10 mg via ORAL
  Filled 2020-12-03: qty 2

## 2020-12-03 NOTE — Progress Notes (Signed)
Patient ID: Travis [name], male   DOB: 03-Apr-1961, 59 y.o.   MRN: [id number]   [facility] Progress Note   Assessment/ Plan:   1.  Acute hypoxic respiratory failure: Extubated on 12/22 and remains on supplemental oxygen via nasal cannula. He has a right parapneumonic effusion complicating MSSA cavitary pneumonia and is on intravenous Ancef.  He continues to use an incentive spirometer and the plan is to undergo hemodialysis tomorrow for continued ultrafiltration. 2. ESRD: Usually on hemodialysis on a Monday/Wednesday/Friday schedule and last hemodialysis done 12/24 with 3 L UF.  Blood pressures appear to be rising which bodes well for efforts at ultrafiltration with hemodialysis again tomorrow; as needed albumin will be used along with midodrine.  Heparin not being used due to significant ABLA/GI bleed recently.  Appears to be very catabolic with significant rise of BUN overnight. 3. Anemia: Multifactorial with acute illness and now likely complicated by malnutrition/inflammatory complex with sacral decubitus.  Continue ESA with as needed iron.  Recently worsened GI bleed secondary to duodenal ulcers and esophagitis.  Currently on sucralfate-I have ordered a stop date of 12/31 to limit aluminum toxicity. 4. CKD-MBD: Calcium and phosphorus level currently at goal, continue strategies to optimize nutrition while on sevelamer 3 times daily AC. 5. Nutrition: Ongoing tube feeds at this time with protein supplementation to help improve healing of large sacral decubitus. 6.  History of acute STEMI: Status post PCI to LAD in 10/2019.  On Brilinta due to multifocal/stable disease. 7.  Sacral decubitus (unstageable): Wound care with hydrotherapy and pressure alleviating devices. Maintain optimization of nutritional status to help wound healing.  Subjective:   Without acute events overnight, would like to record voice notes of conversation because he tends to forget.   Objective:   BP 134/84    Pulse 81   Temp 99.2 F (37.3 C) (Oral)   Resp 20   Ht 5\' 11"  (1.803 m)   Wt 77 kg   SpO2 (!) 85%   BMI 23.68 kg/m   Physical Exam: Gen: Awake, alert, denies any focal complaints except for intermittent back pain CVS: Pulse regular rhythm, normal rate, S1 and S2 normal without murmur/rub or gallop Resp: Decreased breath sounds over bases-poor inspiratory effort. No distinct rales Abd: Soft, nondistended, bowel sounds normal Ext: 1+ bilateral pitting edema.  Left upper arm AV fistula with intact dressings  Labs: BMET Recent Labs  Lab 11/27/20 1723 11/28/20 0504 11/28/20 1034 11/29/20 0057 11/30/20 0040 12/01/20 0732 12/02/20 0335 12/03/20 0057  NA  --  134* 133* 134* 133* 132* 133* 131*  K  --  2.6* 2.9* 3.8 3.7 4.5 4.3 4.6  CL  --  94* 92* 95* 96* 92* 92* 91*  CO2  --  28 27 25 25 25 27 24   GLUCOSE  --  81 91 128* 136* 138* 91 97  BUN  --  24* 26* 40* 42* 82* 66* 100*  CREATININE  --  5.25* 5.71* 6.88* 4.92* 7.01* 5.44* 7.13*  CALCIUM  --  9.2 9.3 9.1 9.5 9.7 10.1 10.3  PHOS 3.0 3.6  --  4.3 3.4 4.1 3.7 4.7*   CBC Recent Labs  Lab 11/30/20 0040 12/01/20 0709 12/02/20 0335 12/03/20 0057  WBC 20.7* 20.9* 17.5* 19.2*  HGB 7.7* 7.3* 7.3* 7.1*  HCT 25.0* 23.7* 22.7* 23.4*  MCV 93.6 91.9 90.8 93.6  PLT 407* 405* 423* 450*      Medications:    . amiodarone  200 mg Per Tube BID  .  atorvastatin  80 mg Per Tube q1800  . Chlorhexidine Gluconate Cloth  6 each Topical Q0600  . Chlorhexidine Gluconate Cloth  6 each Topical Q0600  . collagenase   Topical Daily  . [START ON 12/04/2020] darbepoetin (ARANESP) injection - DIALYSIS  60 mcg Intravenous Q Mon-HD  . docusate  100 mg Per Tube BID  . doxercalciferol  5 mcg Intravenous Q M,W,F  . feeding supplement (PROSource TF)  90 mL Per Tube QID  . guaiFENesin  15 mL Per Tube Q4H  . heparin injection (subcutaneous)  5,000 Units Subcutaneous Q8H  . lactobacillus acidophilus  1 tablet Per Tube TID  . mouth rinse  15 mL  Mouth Rinse BID  . melatonin  5 mg Per Tube QHS  . metoprolol tartrate  5 mg Intravenous Q8H  . midodrine  20 mg Per Tube TID WC  . multivitamin  1 tablet Per Tube QHS  . nutrition supplement (JUVEN)  1 packet Per Tube BID BM  . pantoprazole sodium  40 mg Per Tube BID  . polyethylene glycol  17 g Per Tube Daily  . QUEtiapine  100 mg Per Tube QHS  . sevelamer carbonate  1.6 g Per Tube TID WC  . sodium chloride flush  10 mL Intracatheter Q8H  . sodium chloride flush  10-40 mL Intracatheter Q12H  . sodium chloride flush  3 mL Intravenous Q12H  . sucralfate  1 g Per Tube QID  . ticagrelor  90 mg Per Tube BID   Elmarie Shiley, MD 12/03/2020, 7:09 AM

## 2020-12-03 NOTE — Progress Notes (Addendum)
NAME:  Travis [name], MRN:  [id number], DOB:  Oct 14, 1961, LOS: 65 ADMISSION DATE:  10/28/2020, CONSULTATION DATE:  10/29/20 REFERRING MD:  Cardiology - [name], CHIEF COMPLAINT:  Hypotension, gram positive cocci on culture, AMS  Brief History   Patient is a 59 y.o. male with history of ESRD, HTN, CAD-who presented with anterior STEMI-treated with DES to LAD-hospital course prolonged and complicated by atrial fibrillation with RVR requiring cardioversion x2,, septic shock due to MSSA bacteremia, brief PEA arrest requiring CPR, acute hypoxic respiratory failure requiring intubation, upper GI bleed with acute blood loss anemia due to severe esophagitis requiring PRBC transfusion, severe metabolic encephalopathy/ICU delirium, bilateral lung abscesses with empyema requiring chest tube insertion   Past Medical History  HTN ESRD, HD MWF  Significant Hospital Events   11/20 STEMI-stent LAD.  11/21 Afib with RVR, s.p cardioversion  11/23 S/P DCCV of recurrent Afib. Intubated. Septic PNA and cardiogenic shock.  11/24 Brief brady arrest ADD: Start CRRT.  11/30 Stool output from mouth> Ileus.  12/2 EGD: Esophagitis. No bleeding and probable ischemic duodenitis  12/3 extubated.  12/4 CRRT d/c'd. Delirious  12/5 iHD  12/9 R CT inserted for pleural effusion.  12/11 Given a course of TPA and lytics via chest tube 12/12 Tx to [facility],  12/13 Pt turned over and pulled chest tube and there is leak, stat CXR ordered.  12/14 Chest tube replaced. Readmit to ICU with worsening pleural effusion and hypotension. Levo started.  12/19 Txf back to ICU for resp failure, failed bipap and was intubated.  12/22 Extubated.  Has tenuous respiratory status requiring BiPAP 12/23 Multidisciplinary goals of care meeting.  Remains full code. 12/25 Remains on Precedex due to anxiety, delirium.  Consults:  PCCM  Nephrology ID  Procedures:  Central line 11/21 >> 11/29 Arterial line 11/21 out Repeat DCCV 11/23 ETT  11/23 > 12/3 ETT 12/12 > 12/22 HD line 11/24 RIJ >> Arterial line 11/24 out L [place] CVC 11/30>>  Right-sided chest tube 12/14>> 12/19  Significant Diagnostic Tests:   11/24 TEE>>Left ventricular ejection fraction, by estimation, is 35 to 40%. The  left ventricle has moderately decreased function.Moderate to severe mitral regurgitation.  11/23 CT Chest>>Extensive multifocal nodular and patchy airspace disease in both lungs with a slight peripheral predominance in the upper lungs. 3.1 cm nodular consolidative opacity in the right upper lobe is cavitated. Imaging features likely related to multifocal pneumonia. Septic emboli and metastatic disease considered less likely but not excluded.  11/22 LUE Vas Upper Extremity Doppler>> Arteriovenous fistula-Aneurysmal dilatation noted.  11/24 CT abdomen - no retroperitoneal hematoma  11/29 MR Brain>> No evidence acute intracranial abnormality, sinisitis, mastoid effusions  11/29>> MR Cervical Spine>> Given the provided history of bacteremia, facet joint septic arthritis is difficult to definitively exclude, but the lack of any surrounding marrow edema argues against this Cervical spondylosis   11/29 MR Thoracic Spine No significant marrow edema in thoracic spine , Mild thoracic spondylosis hypointense marrow signal throughout the thoracic spine, likely related to the patient's end-stage renal disease. multifocal airspace disease and cavitary pulmonary lesions. Small right pleural effusion.  11/29 MR Lumbar Spine:As noted above and Suspected cholecystolithiasis  12/11 CT chest  improving air space disease - R empyema persists but adequate chest tube position.    Micro Data:  11/21 BCx2>>Staph aureus > MSSA by BCID 11/22 BCx 2 >> neg 11/23 trach asp >> staph aureus 11/24 BCx2>> neg 11/30 BC x 2>> staph epi>> MRSA 12/2/ BCx 2 >> neg 12/9 right  pleural fluid >> negative  Antimicrobials:  Zosyn 11/21 x 1 Ancef 11/21 ->   Interim  history/subjective:   Got off Precedex today morning.  Has been hemodynamically stable  Objective   Blood pressure (!) 143/85, pulse 88, temperature 98.3 F (36.8 C), temperature source Oral, resp. rate (!) 22, height 5\' 11"  (1.803 m), weight 78.8 kg, SpO2 100 %.        Intake/Output Summary (Last 24 hours) at 12/03/2020 1021 Last data filed at 12/03/2020 0900 Gross per 24 hour  Intake 495.28 ml  Output 50 ml  Net 445.28 ml   Filed Weights   12/01/20 1104 12/02/20 0234 12/03/20 0630  Weight: 64.9 kg 77 kg 78.8 kg   Physical exam  Gen:      No acute distress HEENT:  EOMI, sclera anicteric Neck:     No masses; no thyromegaly Lungs:    Clear to auscultation bilaterally; normal respiratory effort CV:         Regular rate and rhythm; no murmurs Abd:      + bowel sounds; soft, non-tender; no palpable masses, no distension Ext:    No edema; adequate peripheral perfusion Skin:      Warm and dry; no rash Neuro: Awake, oriented  Labs/imaging personally reviewed Sodium 131, BUN/creatinine 100/7.13, WBC 19.2 No new imaging  Resolved Hospital Problem list   Parapneumonic effusion status post chest tube placement with DNase and TPA Chest tube removed 12/19  Assessment & Plan:  Acute hypoxemic respiratory failure Likely secondary to volume overload -CTA chest shows bilateral groundglass opacities consistent with likely pulmonary edema  P: Tolerated extubation morning of 12/22 Anxiety is a major component and he remains on and off Precedex drip If he is reintubated then will need a tracheostomy.  ESRD on HD- MWF P: Continue hemodialysis per nephrology  Anterior ST elevation MI s/p DES 11/20 Ischemic cardiomyopathy with an ejection fraction of 40 to 45% Paroxysmal atrial fibrillation -Anticoagulation on hold due to recent GI bleed Mitral regurgitation P: Cardiology following, appreciate assistance Continue Brilinta, statin, and amiodarone Remains not a candidate for  anticoagulation given recent GI bleed Continue antiplatelet therapy Will need distal RCA intervention once stabilized Continuous telemetry  MSSA bacteremia -Seen with multidrug-resistant Staph epidermidis P: Evaluated by infectious disease during hospitalization, recommendations made for prolonged antibiotic coverage Continue cefazolin x6 weeks with stop date in place  Unstageable sacral ulcer P: Continue hydrotherapy as able WOC consulted Pressure alleviating devices Frequent every 2 hours turns  Generalized deconditioning P: PT/OT as able  Anemia of chronic illness Anemia of critical illness Recent GI bleed  -status post EGD showing severe esophagitis and duodenal ulceration P: Trend CBC Transfuse per protocol Hemoglobin goal greater than 8  Anxiety, ICU delirium P: Got off Precedex drip.  Continues to be delirious Continue Seroquel, melatonin at night We will add 1 dose Ambien to help him sleep today.  Best practice (evaluated daily)  Diet: Tube feeds Pain/Anxiety/Delirium protocol (if indicated): Precedex VAP protocol (if indicated): Needed DVT prophylaxis: Subcutaneous heparin GI prophylaxis: Protonix Glucose control: Monitor Mobility: Bed Disposition: ICU for 24 more hours.  May be transferred tomorrow.  Family is anxious about transfer as he has had multiple bounce backs to ICU  Goals of Care:  Last date of multidisciplinary goals of care discussion: 12/25 Family and staff present: Re dicussed with Wife, patient and nurse Summary of discussion: Family wants to continue aggressive care. Do not want to transfer out of ICU unless stable as he has  several bounce backs  Follow up goals of care discussion due: 1/1 Code Status: Full  Critical care:   The patient is critically ill with multiple organ system failure and requires high complexity decision making for assessment and support, frequent evaluation and titration of therapies, advanced monitoring, review  of radiographic studies and interpretation of complex data.   Critical Care Time devoted to patient care services, exclusive of separately billable procedures, described in this note is 35  minutes.   Marshell Garfinkel MD [facility] Please see [email or web address] for pager details.  12/03/2020, 10:21 AM

## 2020-12-03 NOTE — Progress Notes (Signed)
eLink Physician-Brief Progress Note Patient Name: TREJUAN MATHERNE DOB: May 21, 1961 MRN: [id number]   Date of Service  12/03/2020  HPI/Events of Note  Pt has stage 4 sacral decub and only has oxycodone ordered for hydrotherapy in AM. Need PRN to get till AM.  Camera eval done. Discussed with Andruw.  Pain on movement and flexi seal. Family asking for pain meds other than tylenol. On 10 lit HFNC.Hg 7.1, Cr 7/HD in AM.   eICU Interventions  - already on Seroquel, ambien. Would avoid oxycodone at this time in night.he is very anxious.  Re start precedex for 6  To 8 hrs and stop in AM.  Re assess pain in AM.       Intervention Category Intermediate Interventions: Pain - evaluation and management  Elmer Sow 12/03/2020, 8:12 PM

## 2020-12-04 DIAGNOSIS — I2109 ST elevation (STEMI) myocardial infarction involving other coronary artery of anterior wall: Secondary | ICD-10-CM | POA: Diagnosis not present

## 2020-12-04 DIAGNOSIS — J9601 Acute respiratory failure with hypoxia: Secondary | ICD-10-CM | POA: Diagnosis not present

## 2020-12-04 DIAGNOSIS — N186 End stage renal disease: Secondary | ICD-10-CM | POA: Diagnosis not present

## 2020-12-04 LAB — RENAL FUNCTION PANEL
Albumin: 1.8 g/dL — ABNORMAL LOW (ref 3.5–5.0)
Anion gap: 17 — ABNORMAL HIGH (ref 5–15)
BUN: 135 mg/dL — ABNORMAL HIGH (ref 6–20)
CO2: 24 mmol/L (ref 22–32)
Calcium: 10 mg/dL (ref 8.9–10.3)
Chloride: 90 mmol/L — ABNORMAL LOW (ref 98–111)
Creatinine, Ser: 8.99 mg/dL — ABNORMAL HIGH (ref 0.61–1.24)
GFR, Estimated: 6 mL/min — ABNORMAL LOW (ref >60)
Glucose, Bld: 99 mg/dL (ref 70–99)
Phosphorus: 5.4 mg/dL — ABNORMAL HIGH (ref 2.5–4.6)
Potassium: 4.8 mmol/L (ref 3.5–5.1)
Sodium: 131 mmol/L — ABNORMAL LOW (ref 135–145)

## 2020-12-04 LAB — CBC
HCT: 22.9 % — ABNORMAL LOW (ref 39.0–52.0)
Hemoglobin: 7 g/dL — ABNORMAL LOW (ref 13.0–17.0)
MCH: 28.1 pg (ref 26.0–34.0)
MCHC: 30.6 g/dL (ref 30.0–36.0)
MCV: 92 fL (ref 80.0–100.0)
Platelets: 423 10*3/uL — ABNORMAL HIGH (ref 150–400)
RBC: 2.49 MIL/uL — ABNORMAL LOW (ref 4.22–5.81)
RDW: 14 % (ref 11.5–15.5)
WBC: 16.5 10*3/uL — ABNORMAL HIGH (ref 4.0–10.5)
nRBC: 0 % (ref 0.0–0.2)

## 2020-12-04 LAB — GLUCOSE, CAPILLARY
Glucose-Capillary: 94 mg/dL (ref 70–99)
Glucose-Capillary: 84 mg/dL (ref 70–99)
Glucose-Capillary: 74 mg/dL (ref 70–99)
Glucose-Capillary: 92 mg/dL (ref 70–99)
Glucose-Capillary: 90 mg/dL (ref 70–99)
Glucose-Capillary: 124 mg/dL — ABNORMAL HIGH (ref 70–99)

## 2020-12-04 LAB — MAGNESIUM: Magnesium: 2.2 mg/dL (ref 1.7–2.4)

## 2020-12-04 MED ORDER — DARBEPOETIN ALFA 60 MCG/0.3ML IJ SOSY
PREFILLED_SYRINGE | INTRAMUSCULAR | Status: AC
  Administered 2020-12-04: 19:00:00 60 ug via INTRAVENOUS
  Filled 2020-12-04: qty 0.3

## 2020-12-04 MED ORDER — METOPROLOL TARTRATE 25 MG/10 ML ORAL SUSPENSION
25.0000 mg | Freq: Two times a day (BID) | ORAL | Status: DC
  Administered 2020-12-04 – 2020-12-05 (×2): 25 mg
  Filled 2020-12-04 (×2): qty 10

## 2020-12-04 MED ORDER — DOXERCALCIFEROL 4 MCG/2ML IV SOLN
INTRAVENOUS | Status: AC
  Administered 2020-12-04: 19:00:00 5 ug via INTRAVENOUS
  Filled 2020-12-04: qty 2

## 2020-12-04 NOTE — Progress Notes (Signed)
Physical Therapy Wound Treatment and Discharge Patient Details  Name: Travis [name] MRN: [id number] Date of Birth: [birth year]  Today's Date: 12/04/2020 Time: 1002-1054 Time Calculation (min): 52 min  Subjective  Subjective: Pt pleasant and thankful for therapist's efforts. Patient and Family Stated Goals: None stated Prior Treatments: Dressing changes  Pain Score: Pt premedicated and moderately painful throughout portions of treatment.   Wound Assessment  Pressure Injury 11/14/20 Buttocks Mid;Right;Left Unstageable - Full thickness tissue loss in which the base of the injury is covered by slough (yellow, tan, gray, green or brown) and/or eschar (tan, brown or black) in the wound bed. (Active)  Wound Image   12/04/20 1154  Dressing Type ABD;Barrier Film (skin prep);Gauze (Comment);Moist to dry 12/04/20 1154  Dressing Changed;Clean;Dry;Intact 12/04/20 1154  Dressing Change Frequency Daily 12/04/20 1154  State of Healing Early/partial granulation 12/04/20 1154  Site / Wound Assessment Pink;Yellow 12/04/20 1154  % Wound base Red or Granulating 75% 12/04/20 1154  % Wound base Yellow/Fibrinous Exudate 15% 12/04/20 1154  % Wound base Black/Eschar 0% 12/04/20 1154  % Wound base Other/Granulation Tissue (Comment) 10% Fibrous sheath/connective tissue 12/04/20 1154  Peri-wound Assessment Intact 12/04/20 1154  Wound Length (cm) 10 cm 12/04/20 1000  Wound Width (cm) 15 cm 12/04/20 1000  Wound Depth (cm) 3.1 cm 12/04/20 1000  Wound Surface Area (cm^2) 150 cm^2 12/04/20 1000  Wound Volume (cm^3) 465 cm^3 12/04/20 1000  Tunneling (cm) 0 11/22/20 1401  Undermining (cm) 1.5 cm from 12:00-1:00; 3.1 cm at 11:00 12/04/20 1154  Margins Unattached edges (unapproximated) 12/04/20 1154  Drainage Amount Minimal 12/04/20 1154  Drainage Description Serosanguineous 12/04/20 1154  Treatment Debridement (Selective);Hydrotherapy (Pulse lavage);Packing (Saline gauze) 12/04/20 1154   Santyl applied to wound  bed prior to applying dressing.     Hydrotherapy Pulsed lavage therapy - wound location: Buttocks Pulsed Lavage with Suction (psi): 4 psi (4-8 due to pain) Pulsed Lavage with Suction - Normal Saline Used: 1000 mL Pulsed Lavage Tip: Tip with splash shield Selective Debridement Selective Debridement - Location: Buttocks Selective Debridement - Tools Used: Forceps;Scalpel Selective Debridement - Tissue Removed: yellow unviable tissue   Wound Assessment and Plan  Wound Therapy - Assess/Plan/Recommendations Wound Therapy - Clinical Statement: WOC RN's present to assess wound with PT this session and agree that wound has made significant progress with ~15% slough remaining. This wound is appropriate for continued wet>dry dressing changes by nursing staff and hydrotherapy will sign off at this time. If needs change, please reconsult. Wound Therapy - Functional Problem List: Global weakness Factors Delaying/Impairing Wound Healing: Immobility;Multiple medical problems Hydrotherapy Plan: Debridement;Dressing change;Patient/family education;Pulsatile lavage with suction Wound Therapy - Frequency: 3X / week Wound Therapy - Follow Up Recommendations: Skilled nursing facility Wound Plan: See above  Wound Therapy Goals- Improve the function of patient's integumentary system by progressing the wound(s) through the phases of wound healing (inflammation - proliferation - remodeling) by: Decrease Necrotic Tissue to: 0 Decrease Necrotic Tissue - Progress: Partly met Increase Granulation Tissue to: 100 Increase Granulation Tissue - Progress: Partly met Goals/treatment plan/discharge plan were made with and agreed upon by patient/family: Yes Time For Goal Achievement: 7 days Wound Therapy - Potential for Goals: Good  Goals will be updated until maximal potential achieved or discharge criteria met.  Discharge criteria: when goals achieved, discharge from hospital, MD decision/surgical intervention, no  progress towards goals, refusal/missing three consecutive treatments without notification or medical reason.  GP     Thelma Comp 12/04/2020, 12:00 PM   Rolinda Roan,  PT, DPT Acute Rehabilitation Services Pager: [phone] Office: [phone]

## 2020-12-04 NOTE — Progress Notes (Signed)
While having a bowel movement, patient pushed rectal tube out.  Scant amount of blood noted, stool soft, mushy, semi-formed.  Discussed with patient and his wife, at bedside.  She stated patient has hemorrhoids.  Discussed plan to see if patient can utilize bedpan instead of rectal tube at this time.

## 2020-12-04 NOTE — Consult Note (Signed)
WOC Nurse Consult Note: Patient receiving care in [place].  PT to signoff on hydrotherapy after today's treatment.  See PT note for wound details.  I have continued the santyl and saline gauze treatment for now.  Will re-evaluate next week to determine POC. Val Riles, RN, MSN, CWOCN, CNS-BC, pager [phone]

## 2020-12-04 NOTE — Progress Notes (Signed)
Pt continues to demonstrate impaired cognition, but demonstrating improved activity tolerance with VSS on 8L 02 while seated EOB x 40 minutes for lunch meal. Stood with 2 person min assist and RW from elevated bed, marched and took several steps toward [facility]. Continues to be appropriate for intensive rehab on CIR.   12/04/20 1500  OT Visit Information  Last OT Received On 12/04/20  Assistance Needed +2  PT/OT/SLP Co-Evaluation/Treatment Yes  Reason for Co-Treatment Complexity of the patient's impairments (multi-system involvement);For patient/therapist safety  OT goals addressed during session ADL's and self-care  History of Present Illness Pt is 59 y.o. male with past medical history of end-stage renal disease on hemodialysis, essential hypertension, dilated ascending aorta and hyperlipidemia who presented on November 20 with anterior ST elevation myocardial infarction.  VDRF 11/23-12/3.  Stent placed 11/20.  Afib with RVR with cardioversion 11/21. Suspected cholecystolithiasis 11/29. Respiratory failure and reintubated 11/26/20.  Precautions  Precautions Fall  Precaution Comments cortrak, 02  Pain Assessment  Pain Assessment Faces  Faces Pain Scale 4  Pain Location bottom  Pain Descriptors / Indicators Discomfort;Sore  Pain Intervention(s) Monitored during session;Repositioned  Cognition  Arousal/Alertness Awake/alert  Behavior During Therapy Flat affect  Overall Cognitive Status Impaired/Different from baseline  Area of Impairment Memory;Following commands;Safety/judgement;Awareness;Problem solving;Attention  Current Attention Level Selective  Memory Decreased short-term memory  Following Commands Follows one step commands inconsistently  Safety/Judgement Decreased awareness of safety;Decreased awareness of deficits  Awareness Intellectual;Emergent  Problem Solving Difficulty sequencing;Requires verbal cues;Requires tactile cues;Decreased initiation;Slow processing  General Comments Pt  internally distracted with tangential conversation. Verbose.  ADL  Overall ADL's  Needs assistance/impaired  Eating/Feeding Set up;Sitting  Eating/Feeding Details (indicate cue type and reason) assisted to open containers  Grooming Wash/dry hands;Sitting;Set up;Supervision/safety (blowing nose)  Lower Body Dressing Total assistance;Bed level  General ADL Comments Pt highly focused on eating his entire plate of food.  Bed Mobility  Overal bed mobility Needs Assistance  Bed Mobility Rolling;Sidelying to Sit;Sit to Sidelying  Rolling Mod assist;+2 for safety/equipment  Sidelying to sit Mod assist;+2 for safety/equipment  Sit to sidelying Mod assist;+2 for safety/equipment  General bed mobility comments mod assist for LEs over EOB and to raise trunk, assist for LEs back into bed  Balance  Overall balance assessment Needs assistance  Sitting-balance support No upper extremity supported;Feet supported  Sitting balance-Leahy Scale Fair  Sitting balance - Comments sat x 40 min  Standing balance support Bilateral upper extremity supported  Standing balance-Leahy Scale Poor  Standing balance comment Requiring use of UE on RW and +2 min  Restrictions  Weight Bearing Restrictions No  Transfers  Overall transfer level Needs assistance  Equipment used Rolling walker (2 wheeled)  Transfers Sit to/from Stand  Sit to Stand +2 physical assistance;Min assist;From elevated surface  General transfer comment Min A +2 to power up and gain balance. elevated bed for assistance into standing.  Stood  for 2 min while pt marched in place as well as stepped to [facility].  .  General Comments  General comments (skin integrity, edema, etc.) VSS on 8L  OT - End of Session  Equipment Utilized During Treatment Rolling walker;Oxygen (8L)  Activity Tolerance Patient tolerated treatment well  Patient left in bed;with call bell/phone within reach;with bed alarm set  Nurse Communication Mobility status  OT  Assessment/Plan  OT Plan Discharge plan remains appropriate  OT Visit Diagnosis Unsteadiness on feet (R26.81);Other abnormalities of gait and mobility (R26.89);Muscle weakness (generalized) (M62.81);Pain  OT Frequency (ACUTE ONLY) Min 2X/week  Follow Up Recommendations CIR;Supervision/Assistance - 24 hour  OT Equipment 3 in 1 bedside commode;Wheelchair (measurements OT);Wheelchair cushion (measurements OT)  AM-PAC OT "6 Clicks" Daily Activity Outcome Measure (Version 2)  Help from another person eating meals? 3  Help from another person taking care of personal grooming? 3  Help from another person toileting, which includes using toliet, bedpan, or urinal? 2  Help from another person bathing (including washing, rinsing, drying)? 2  Help from another person to put on and taking off regular upper body clothing? 2  Help from another person to put on and taking off regular lower body clothing? 1  6 Click Score 13  OT Goal Progression  Progress towards OT goals Progressing toward goals  Acute Rehab OT Goals  Patient Stated Goal Move better and get stronger  OT Goal Formulation With patient  Time For Goal Achievement 12/18/20  Potential to Achieve Goals Good  OT Time Calculation  OT Start Time (ACUTE ONLY) 1150  OT Stop Time (ACUTE ONLY) 1238  OT Time Calculation (min) 48 min  OT General Charges  $OT Visit 1 Visit  OT Treatments  $Self Care/Home Management  8-22 mins  Nestor Lewandowsky, OTR/L Acute Rehabilitation Services Pager: [phone] Office: [phone]

## 2020-12-04 NOTE — Progress Notes (Signed)
NAME:  Travis [name], MRN:  [id number], DOB:  04-20-1961, LOS: 65 ADMISSION DATE:  10/28/2020, CONSULTATION DATE:  10/29/20 REFERRING MD:  Cardiology - [name], CHIEF COMPLAINT:  Hypotension, gram positive cocci on culture, AMS  Brief History   Patient is a 59 y.o. male with history of ESRD, HTN, CAD-who presented with anterior STEMI-treated with DES to LAD-hospital course prolonged and complicated by atrial fibrillation with RVR requiring cardioversion x2,, septic shock due to MSSA bacteremia, brief PEA arrest requiring CPR, acute hypoxic respiratory failure requiring intubation, upper GI bleed with acute blood loss anemia due to severe esophagitis requiring PRBC transfusion, severe metabolic encephalopathy/ICU delirium, bilateral lung abscesses with empyema requiring chest tube insertion   Past Medical History  HTN ESRD, HD MWF  Significant Hospital Events   11/20 STEMI-stent LAD.  11/21 Afib with RVR, s.p cardioversion  11/23 S/P DCCV of recurrent Afib. Intubated. Septic PNA and cardiogenic shock.  11/24 Brief brady arrest ADD: Start CRRT.  11/30 Stool output from mouth> Ileus.  12/2 EGD: Esophagitis. No bleeding and probable ischemic duodenitis  12/3 extubated.  12/4 CRRT d/c'd. Delirious  12/5 iHD  12/9 R CT inserted for pleural effusion.  12/11 Given a course of TPA and lytics via chest tube 12/12 Tx to [facility],  12/13 Pt turned over and pulled chest tube and there is leak, stat CXR ordered.  12/14 Chest tube replaced. Readmit to ICU with worsening pleural effusion and hypotension. Levo started.  12/19 Txf back to ICU for resp failure, failed bipap and was intubated.  12/22 Extubated.  Has tenuous respiratory status requiring BiPAP 12/23 Multidisciplinary goals of care meeting.  Remains full code. 12/25 Remains on Precedex due to anxiety, delirium.  Consults:  PCCM  Nephrology ID  Procedures:  Central line 11/21 >> 11/29 Arterial line 11/21 out Repeat DCCV 11/23 ETT  11/23 > 12/3 ETT 12/12 > 12/22 HD line 11/24 RIJ >> Arterial line 11/24 out L Baxter CVC 11/30>>  Right-sided chest tube 12/14>> 12/19  Significant Diagnostic Tests:   11/24 TEE>>Left ventricular ejection fraction, by estimation, is 35 to 40%. The  left ventricle has moderately decreased function.Moderate to severe mitral regurgitation.  11/23 CT Chest>>Extensive multifocal nodular and patchy airspace disease in both lungs with a slight peripheral predominance in the upper lungs. 3.1 cm nodular consolidative opacity in the right upper lobe is cavitated. Imaging features likely related to multifocal pneumonia. Septic emboli and metastatic disease considered less likely but not excluded.  11/22 LUE Vas Upper Extremity Doppler>> Arteriovenous fistula-Aneurysmal dilatation noted.  11/24 CT abdomen - no retroperitoneal hematoma  11/29 MR Brain>> No evidence acute intracranial abnormality, sinisitis, mastoid effusions  11/29>> MR Cervical Spine>> Given the provided history of bacteremia, facet joint septic arthritis is difficult to definitively exclude, but the lack of any surrounding marrow edema argues against this Cervical spondylosis   11/29 MR Thoracic Spine No significant marrow edema in thoracic spine , Mild thoracic spondylosis hypointense marrow signal throughout the thoracic spine, likely related to the patient's end-stage renal disease. multifocal airspace disease and cavitary pulmonary lesions. Small right pleural effusion.  11/29 MR Lumbar Spine:As noted above and Suspected cholecystolithiasis  12/11 CT chest  improving air space disease - R empyema persists but adequate chest tube position.    Micro Data:  11/21 BCx2>>Staph aureus > MSSA by BCID 11/22 BCx 2 >> neg 11/23 trach asp >> staph aureus 11/24 BCx2>> neg 11/30 BC x 2>> staph epi>> MRSA 12/2/ BCx 2 >> neg 12/9 right  pleural fluid >> negative  Antimicrobials:  Zosyn 11/21 x 1 Ancef 11/21 ->   Interim  history/subjective:   Off Precedex since yesterday. No voiced complaints but family concerned regarding risk of premature transfer to floor.   Objective   Blood pressure (!) 150/70, pulse 85, temperature 98.9 F (37.2 C), temperature source Oral, resp. rate (!) 29, height 5\' 11"  (1.803 m), weight 79.9 kg, SpO2 96 %.        Intake/Output Summary (Last 24 hours) at 12/04/2020 1132 Last data filed at 12/04/2020 0500 Gross per 24 hour  Intake 349.19 ml  Output 25 ml  Net 324.19 ml   Filed Weights   12/02/20 0234 12/03/20 0630 12/04/20 0600  Weight: 77 kg 78.8 kg 79.9 kg   Physical exam  Gen:      No acute distress HEENT:  EOMI, sclera anicteric Neck:     No masses; no thyromegaly Lungs:    Clear to auscultation bilaterally; normal respiratory effort CV:         Regular rate and rhythm; no murmurs Abd:      + bowel sounds; soft, non-tender; no palpable masses, no distension Ext:    No edema; adequate peripheral perfusion Skin:      Warm and dry; no rash Neuro:  Awake, oriented, generalized weakness  Resolved Hospital Problem list   Parapneumonic effusion status post chest tube placement with DNase and TPA Chest tube removed 12/19  Assessment & Plan:  Acute hypoxemic respiratory insufficiency. Likely secondary to volume overload CTA chest shows bilateral groundglass opacities consistent with likely pulmonary edema  Tolerated extubation morning of 12/22 Anxiety is a major component and he remains on and off Precedex drip If he is reintubated then will need a tracheostomy. - Should be able to go to PCU once oxygen requirement <4Lpm.  ESRD on HD- MWF -Continue hemodialysis per nephrology  Anterior ST elevation MI s/p DES 11/20 Ischemic cardiomyopathy with an ejection fraction of 40 to 45% Paroxysmal atrial fibrillation Anticoagulation on hold due to recent GI bleed Mitral regurgitation Cardiology following, appreciate assistance Continue Brilinta, statin, and  amiodarone Remains not a candidate for anticoagulation given recent GI bleed Continue antiplatelet therapy Will need distal RCA intervention once stabilized Continuous telemetry  MSSA bacteremia with lung seeding.  Evaluated by infectious disease during hospitalization, recommendations made for prolonged antibiotic coverage -Continue cefazolin x6 weeks with stop date in place  Unstageable sacral ulcer Continue hydrotherapy as able WOC consulted Pressure alleviating devices Frequent every 2 hours turns  Generalized deconditioning PT/OT as able  Anemia of chronic illness Anemia of critical illness Recent GI bleed  Status post EGD showing severe esophagitis and duodenal ulceration Trend CBC Transfuse per protocol Hemoglobin goal greater than 8  Anxiety, ICU delirium Got off Precedex drip.  Continues to be delirious Continue Seroquel, melatonin at night   Daily Goals Checklist  Pain/Anxiety/Delirium protocol (if indicated): seroquel VAP protocol (if indicated): not intubated Respiratory support goals: incentive spirometry, wean O2 Blood pressure target: keep MAP> 65 DVT prophylaxis heparin tid Nutrition Status: high nutritional risk - continue tube feeds GI prophylaxis: PPI bid given recent bleed Fluid status goals: per nephrology Urinary catheter: not required. Central lines: PIV only Glucose control: adequate control on no therapy. Mobility/therapy needs: PT/OT Antibiotic de-escalation: Complete 6 weeks of cefazolin  - now day  Home medication reconciliation: reviewed. Daily labs: CBC and renal panel on HD only. Code Status: full  Family Communication: wife updated at the bedside Disposition: ICU  Goals of  Care:  Last date of multidisciplinary goals of care discussion: 12/25 Family and staff present: Re dicussed with Wife, patient and nurse Summary of discussion: Family wants to continue aggressive care. Do not want to transfer out of ICU unless stable as he has  several bounce backs  Follow up goals of care discussion due: 1/1 Code Status: Full  Kipp Brood, MD [facility] ICU Physician [place]  Pager: [phone] Or Epic Secure Chat After hours: [phone].  12/04/2020, 11:49 AM

## 2020-12-04 NOTE — Progress Notes (Signed)
Physical Therapy Treatment Patient Details Name: Travis [name] MRN: [id number] DOB: September 20, 1961 Today's Date: 12/04/2020    History of Present Illness Pt is 59 y.o. male with past medical history of end-stage renal disease on hemodialysis, essential hypertension, dilated ascending aorta and hyperlipidemia who presented on November 20 with anterior ST elevation myocardial infarction.  VDRF 11/23-12/3.  Stent placed 11/20.  Afib with RVR with cardioversion 11/21. Suspected cholecystolithiasis 11/29. Respiratory failure and reintubated 11/26/20.    PT Comments    Pt admitted with above diagnosis. Pt was able to sit EOB with min guard assist for 40 min. Pt also stood and did some standing exercises.  Pt ate lunch while sitting EOB with good postural stability in sitting. Pt met 1/5 goals and goals revised today. Slow progress due to multiple complications.  Pt currently with functional limitations due to balance and endurance deficits. Pt will benefit from skilled PT to increase their independence and safety with mobility to allow discharge to the venue listed below.     Follow Up Recommendations  CIR     Equipment Recommendations  Wheelchair (measurements PT);Wheelchair cushion (measurements PT);3in1 (PT)    Recommendations for Other Services       Precautions / Restrictions Precautions Precautions: Fall    Mobility  Bed Mobility Overal bed mobility: Needs Assistance Bed Mobility: Rolling;Sidelying to Sit;Sit to Sidelying Rolling: Mod assist;+2 for physical assistance   Supine to sit: Mod assist;+2 for physical assistance   Sit to sidelying: Mod assist General bed mobility comments: Mod A for rolling, bringing BLES over EOB, and then elevating trunk.  Assist for LES back into bed.  Pt had gotten lunch. Nurse did nto want him to sit up in chair as he had dialysis therefore allowed pt to sit EOB and feed himself.  He sat aabout 40 min eating. Pt fed himself.  Transfers Overall  transfer level: Needs assistance Equipment used: Rolling walker (2 wheeled) Transfers: Sit to/from Stand Sit to Stand: Min assist;From elevated surface;+2 safety/equipment         General transfer comment: Min A +2 to power up and gain balance. elevated bed for assistance into standing.  Stood  for 2 min while pt marched in place as well as stepped to [facility].  .  Ambulation/Gait                 Stairs             Wheelchair Mobility    Modified Rankin (Stroke Patients Only)       Balance Overall balance assessment: Needs assistance Sitting-balance support: No upper extremity supported;Feet supported Sitting balance-Leahy Scale: Fair Sitting balance - Comments: Pt sitting EOB for at least 40 mins.  Able to maintain balance with close supervision/min guard.   Standing balance support: Bilateral upper extremity supported Standing balance-Leahy Scale: Poor Standing balance comment: Requiring use of UE on RW and +2 min                            Cognition Arousal/Alertness: Awake/alert Behavior During Therapy: WFL for tasks assessed/performed Overall Cognitive Status: Difficult to assess Area of Impairment: Memory;Following commands;Safety/judgement;Awareness;Problem solving;Orientation;Attention                 Orientation Level: Disoriented to;Time;Situation;Place Current Attention Level: Sustained Memory: Decreased short-term memory Following Commands: Follows one step commands inconsistently Safety/Judgement: Decreased awareness of safety;Decreased awareness of deficits Awareness: Intellectual Problem Solving: Difficulty sequencing;Requires verbal cues;Requires tactile  cues;Decreased initiation;Slow processing        Exercises General Exercises - Lower Extremity Hip Flexion/Marching: Both;AROM;15 reps;Standing    General Comments General comments (skin integrity, edema, etc.): VSS      Pertinent Vitals/Pain Pain Assessment:  Faces Faces Pain Scale: Hurts even more Pain Location: bottom Pain Descriptors / Indicators: Discomfort;Sore Pain Intervention(s): Limited activity within patient's tolerance;Monitored during session;Repositioned    Home Living                      Prior Function            PT Goals (current goals can now be found in the care plan section) Acute Rehab PT Goals PT Goal Formulation: With patient/family Time For Goal Achievement: 12/18/20 Potential to Achieve Goals: Good Progress towards PT goals: Progressing toward goals    Frequency    Min 3X/week      PT Plan Current plan remains appropriate    Co-evaluation PT/OT/SLP Co-Evaluation/Treatment: Yes Reason for Co-Treatment: Complexity of the patient's impairments (multi-system involvement);For patient/therapist safety PT goals addressed during session: Mobility/safety with mobility        AM-PAC PT "6 Clicks" Mobility   Outcome Measure  Help needed turning from your back to your side while in a flat bed without using bedrails?: None Help needed moving from lying on your back to sitting on the side of a flat bed without using bedrails?: A Lot Help needed moving to and from a bed to a chair (including a wheelchair)?: A Lot Help needed standing up from a chair using your arms (e.g., wheelchair or bedside chair)?: A Little Help needed to walk in hospital room?: A Lot Help needed climbing 3-5 steps with a railing? : Total 6 Click Score: 14    End of Session Equipment Utilized During Treatment: Oxygen;Gait belt (8LO2) Activity Tolerance: Patient limited by fatigue;Patient limited by pain Patient left: with call bell/phone within reach;in bed;with bed alarm set Nurse Communication: Mobility status PT Visit Diagnosis: Muscle weakness (generalized) (M62.81)     Time: 8756-4332 PT Time Calculation (min) (ACUTE ONLY): 50 min  Charges:  $Therapeutic Exercise: 8-22 mins $Therapeutic Activity: 8-22 mins                      Travis [name] W,PT Acute Rehabilitation Services Pager:  [phone]  Office:  [phone]     Travis [name] 12/04/2020, 1:57 PM

## 2020-12-04 NOTE — Progress Notes (Signed)
Inpatient Rehabilitation-Admissions Coordinator   [facility] still following for medical readiness. Will need to see how pt does with therapy since he has not had a treatment since 12/20.   Raechel Ache, OTR/L  Rehab Admissions Coordinator  [phone] 12/04/2020 10:25 AM

## 2020-12-04 NOTE — Progress Notes (Signed)
Patient ID: Travis [name], male   DOB: 13-Sep-1961, 59 y.o.   MRN: [id number]  [facility] Progress Note   Assessment/ Plan:   1.  Acute hypoxic respiratory failure/ bilat MSSA cavitary PNA w/ empyema: Extubated on 12/22 and remains on supplemental oxygen via nasal cannula. He has a right parapneumonic effusion complicating MSSA cavitary pneumonia and is on intravenous Ancef.  He continues to use an incentive spirometer and the plan is to undergo hemodialysis tomorrow for continued ultrafiltration. 2. ESRD: on HD MWF schedule. SP CRRT x 2 rounds. Heparin not being used due to significant ABLA/GI bleed recently.  Quite catabolic with significant rise of BUN overnight. 3. Anemia: Multifactorial with acute illness and now likely complicated by malnutrition/inflammatory complex with sacral decubitus.  Continue ESA with as needed iron.  Recently worsened GI bleed secondary to duodenal ulcers and esophagitis.  Currently on sucralfate-I have ordered a stop date of 12/31 to limit aluminum toxicity. 4. CKD-MBD: Calcium and phosphorus level currently at goal, continue strategies to optimize nutrition while on sevelamer 3 times daily AC. 5. Nutrition: Ongoing tube feeds at this time with protein supplementation to help improve healing of large sacral decubitus. 6.  History of acute STEMI: admit diagnosis, sp PCI to LAD on 10/2019.  On Brilinta due to multifocal/stable disease. 7.  Sacral decubitus (unstageable): Wound care with hydrotherapy and pressure alleviating devices. Maintain optimization of nutritional status to help wound healing.  Kelly Splinter, MD 12/04/2020, 1:16 PM    Subjective:   Sitting up on the side of the bed feeding himself w/ 2 PT assisting.  No c/o at this time.    Objective:   BP (!) 150/70   Pulse 85   Temp (!) 97.5 F (36.4 C) (Oral)   Resp (!) 29   Ht 5\' 11"  (1.803 m)   Wt 79.9 kg   SpO2 96%   BMI 24.57 kg/m   Physical Exam: Gen: Awake, alert CVS:  Pulse regular rhythm, normal rate, S1 and S2 normal without murmur/rub or gallop Resp: Decreased breath sounds over bases-poor inspiratory effort. No distinct rales Abd: Soft, nondistended, bowel sounds normal Ext: trace LE edema.  LUA AVF+bruit  Labs: BMET Recent Labs  Lab 11/28/20 0504 11/28/20 1034 11/29/20 0057 11/30/20 0040 12/01/20 0732 12/02/20 0335 12/03/20 0057 12/04/20 0146  NA 134* 133* 134* 133* 132* 133* 131* 131*  K 2.6* 2.9* 3.8 3.7 4.5 4.3 4.6 4.8  CL 94* 92* 95* 96* 92* 92* 91* 90*  CO2 28 27 25 25 25 27 24 24   GLUCOSE 81 91 128* 136* 138* 91 97 99  BUN 24* 26* 40* 42* 82* 66* 100* 135*  CREATININE 5.25* 5.71* 6.88* 4.92* 7.01* 5.44* 7.13* 8.99*  CALCIUM 9.2 9.3 9.1 9.5 9.7 10.1 10.3 10.0  PHOS 3.6  --  4.3 3.4 4.1 3.7 4.7* 5.4*   CBC Recent Labs  Lab 12/01/20 0709 12/02/20 0335 12/03/20 0057 12/04/20 0146  WBC 20.9* 17.5* 19.2* 16.5*  HGB 7.3* 7.3* 7.1* 7.0*  HCT 23.7* 22.7* 23.4* 22.9*  MCV 91.9 90.8 93.6 92.0  PLT 405* 423* 450* 423*      Medications:    . amiodarone  200 mg Per Tube BID  . atorvastatin  80 mg Per Tube q1800  . Chlorhexidine Gluconate Cloth  6 each Topical Q0600  . Chlorhexidine Gluconate Cloth  6 each Topical Q0600  . collagenase   Topical Daily  . darbepoetin (ARANESP) injection - DIALYSIS  60 mcg Intravenous Q Mon-HD  .  docusate  100 mg Per Tube BID  . doxercalciferol  5 mcg Intravenous Q M,W,F  . feeding supplement (PROSource TF)  90 mL Per Tube QID  . guaiFENesin  15 mL Per Tube Q4H  . heparin injection (subcutaneous)  5,000 Units Subcutaneous Q8H  . lactobacillus acidophilus  1 tablet Per Tube TID  . mouth rinse  15 mL Mouth Rinse BID  . melatonin  5 mg Per Tube QHS  . metoprolol tartrate  25 mg Per Tube BID  . midodrine  20 mg Per Tube TID WC  . multivitamin  1 tablet Per Tube QHS  . nutrition supplement (JUVEN)  1 packet Per Tube BID BM  . pantoprazole sodium  40 mg Per Tube BID  . polyethylene glycol  17 g  Per Tube Daily  . QUEtiapine  100 mg Per Tube QHS  . sevelamer carbonate  1.6 g Per Tube TID WC  . sodium chloride flush  10 mL Intracatheter Q8H  . sodium chloride flush  10-40 mL Intracatheter Q12H  . sodium chloride flush  3 mL Intravenous Q12H  . sucralfate  1 g Per Tube QID  . ticagrelor  90 mg Per Tube BID

## 2020-12-04 NOTE — Progress Notes (Signed)
Progress Note  Patient Name: Travis [name] Date of Encounter: 12/04/2020  Primary Cardiologist: [name]  Subjective   More alert today  Inpatient Medications    Scheduled Meds:  amiodarone  200 mg Per Tube BID   atorvastatin  80 mg Per Tube q1800   Chlorhexidine Gluconate Cloth  6 each Topical Q0600   Chlorhexidine Gluconate Cloth  6 each Topical Q0600   collagenase   Topical Daily   darbepoetin (ARANESP) injection - DIALYSIS  60 mcg Intravenous Q Mon-HD   docusate  100 mg Per Tube BID   doxercalciferol  5 mcg Intravenous Q M,W,F   feeding supplement (PROSource TF)  90 mL Per Tube QID   guaiFENesin  15 mL Per Tube Q4H   heparin injection (subcutaneous)  5,000 Units Subcutaneous Q8H   lactobacillus acidophilus  1 tablet Per Tube TID   mouth rinse  15 mL Mouth Rinse BID   melatonin  5 mg Per Tube QHS   metoprolol tartrate  5 mg Intravenous Q8H   midodrine  20 mg Per Tube TID WC   multivitamin  1 tablet Per Tube QHS   nutrition supplement (JUVEN)  1 packet Per Tube BID BM   pantoprazole sodium  40 mg Per Tube BID   polyethylene glycol  17 g Per Tube Daily   QUEtiapine  100 mg Per Tube QHS   sevelamer carbonate  1.6 g Per Tube TID WC   sodium chloride flush  10 mL Intracatheter Q8H   sodium chloride flush  10-40 mL Intracatheter Q12H   sodium chloride flush  3 mL Intravenous Q12H   sucralfate  1 g Per Tube QID   ticagrelor  90 mg Per Tube BID   Continuous Infusions:  sodium chloride 10 mL/hr (11/21/20 1722)   sodium chloride 250 mL (11/27/20 0902)   sodium chloride      ceFAZolin (ANCEF) IV Stopped (12/01/20 1046)   feeding supplement (VITAL 1.5 CAL) 55 mL/hr at 12/01/20 0800   PRN Meds: sodium chloride, acetaminophen, heparin, LORazepam, ondansetron (ZOFRAN) IV, oxyCODONE, Resource ThickenUp Clear, senna, silver nitrate applicators, sodium chloride, sodium chloride flush, sodium chloride flush     Vital Signs    Vitals:    12/04/20 0500 12/04/20 0600 12/04/20 0700 12/04/20 0824  BP: 123/70 123/69 (!) 150/70   Pulse: 77 75 85   Resp: (!) 27 (!) 27 (!) 29   Temp:    98.9 F (37.2 C)  TempSrc:    Oral  SpO2: 92% 94% 96%   Weight:  79.9 kg    Height:        Intake/Output Summary (Last 24 hours) at 12/04/2020 0829 Last data filed at 12/04/2020 0500 Gross per 24 hour  Intake 549.19 ml  Output 25 ml  Net 524.19 ml    I/O since admission: -3319  Filed Weights   12/02/20 0234 12/03/20 0630 12/04/20 0600  Weight: 77 kg 78.8 kg 79.9 kg    Telemetry    Sinus rhythmn in the 80s - Personally Reviewed  ECG    ECG (independently read by me):  Physical Exam    BP (!) 150/70    Pulse 85    Temp 98.9 F (37.2 C) (Oral)    Resp (!) 29    Ht 5\' 11"  (1.803 m)    Wt 79.9 kg    SpO2 96%    BMI 24.57 kg/m  General: Alert today Skin: normal turgor, no rashes, warm and dry HEENT: Normocephalic, atraumatic. Pupils  equal round and reactive to light; sclera anicteric; extraocular muscles intact;  Nose without nasal septal hypertrophy Mouth/Parynx benign;  Neck: No JVD, no carotid bruits; normal carotid upstroke Lungs: clear to ausculatation and percussion; no wheezing or rales Chest wall: without tenderness to palpitation Heart: PMI not displaced, RRR, s1 s2 normal, 1/6 systolic murmur, no diastolic murmur, no rubs, gallops, thrills, or heaves Abdomen: soft, nontender; no hepatosplenomehaly, BS+; abdominal aorta nontender and not dilated by palpation. Back: no CVA tenderness Pulses 2+ Musculoskeletal: full range of motion, normal strength, no joint deformities Extremities: trace edema of feet; no clubbing cyanosis, Homan's sign negative  Neurologic: grossly nonfocal; Cranial nerves grossly wnl Psychologic: Normal mood and affect   Labs    Chemistry Recent Labs  Lab 12/02/20 0335 12/03/20 0057 12/04/20 0146  NA 133* 131* 131*  K 4.3 4.6 4.8  CL 92* 91* 90*  CO2 27 24 24   GLUCOSE 91 97 99  BUN  66* 100* 135*  CREATININE 5.44* 7.13* 8.99*  CALCIUM 10.1 10.3 10.0  ALBUMIN 1.9* 1.9* 1.8*  GFRNONAA 11* 8* 6*  ANIONGAP 14 16* 17*     Hematology Recent Labs  Lab 12/02/20 0335 12/03/20 0057 12/04/20 0146  WBC 17.5* 19.2* 16.5*  RBC 2.50* 2.50* 2.49*  HGB 7.3* 7.1* 7.0*  HCT 22.7* 23.4* 22.9*  MCV 90.8 93.6 92.0  MCH 29.2 28.4 28.1  MCHC 32.2 30.3 30.6  RDW 14.2 14.2 14.0  PLT 423* 450* 423*    Cardiac EnzymesNo results for input(s): TROPONINI in the last 168 hours. No results for input(s): TROPIPOC in the last 168 hours.   BNPNo results for input(s): BNP, PROBNP in the last 168 hours.   DDimer No results for input(s): DDIMER in the last 168 hours.   Lipid Panel     Component Value Date/Time   CHOL 151 10/29/2020 0101   TRIG 71 11/30/2020 0040   HDL 13 (L) 10/29/2020 0101   CHOLHDL 11.6 10/29/2020 0101   VLDL 77 (H) 10/29/2020 0101   LDLCALC 61 10/29/2020 0101     Radiology    No results found.  Cardiac Studies     Cath PCI 11/20: Acute anterolateral ST elevation MI with Culprit lesion being the 99% mid LAD lesion (just past major 1st Diag/D1) along with multivessel CAD including 99% ostial AV groove LCx and 80% calcified napkin ring proximal RCA lesion with extensive calcification throughout the RCA. ? Successful PTCA and DES PCI of the LAD crossing D1 -resolute Onyx DES 2.75 mm x 18 mm postdilated to 3.1 mm.  Mild to moderate reduced EF with anterior anterolateral hypokinesis, EF roughly 40 to 45%.  Initial evaluation suggested normal EDP, but on recheck post PCI LVEDP of 30 mmHg, severely elevated consistent with ACUTE DIASTOLIC HEART FAILURE  Significantly dilated aortic root requiring AL-1 guide catheter for both Left and Right Coronary Angiography   TTE 10/31/2020: EF 35 to 40%. Moderate decreased function. Moderate concentric LVH GR 3 DD. Severe HK of the mid apical inferolateral and inferior wall with moderate HK of the apical septal and anterior  wall. PA pressure estimated 43 mmHg. Severe LA dilation. Moderate RA dilation. Moderate to severe MR. Aortic root dilated at 41 mm. RV PSP 15 mmHg.   LFemV Central line [facility] by [facility] 11/21  This is an purposes  TEE 11/01/2020: EF 35-40% moderate decreased function. Severe LA dilation. No LAA thrombus. Moderate RA dilation. Severe MR-eccentric (with pulmonary vein blunting) the valve itself is grossly normal with mild  coaptation (predominantly atrial functional mitral regurgitation with slight posterior tethering). Mild dilation of ascending aorta measuring 40 mm. Moderate grade 3 plaque..   11/01/2020: Right IJ dialysis catheter placed, right radial arterial line placed.  11/03/2020: CorPac placed inIR   Echo 11/26/2020: EF 45 to 50% with mildly decreased function.  Moderate concentric LVH and indeterminate filling.  Mild to moderate MR due to restricted PMV L with mitral annular calcification III B.  Does appear to be    Patient Profile     59 y.o. male with ESRD on IHD, Ascending AoTAA - admitted with  Anterior STEMI 11/21 ->  PCI of the LAD. He has residual disease in his right coronary and AVG-Cx for planned PCI.  PTA was not feeing well for several days - chills & nausea.  On Cath - LVEDP 30-35 mmHg -> had been drinking lots of H20 & in settting of ACS - acute DHF --> developed pulmonary Edema = Acute HD.   Was then febrile PM 11/20-21 - > now 2/2 BC + Staph a (1 reported MSSA).  Overnight 11/21-22 -> went into Afib RVR - became hypotensive - Now in Septic Shock. after initial restoration of sinus rhythm, went back into A. fib RVR Now in Septic Shock.->Chest x-ray and CT of the chest on 11/23 confirm right lower lobe staph pneumonia-> DCCV on 10/31/2020->followed by intubation by late afternoon. Overnight 11/23-24, had significant agitation requiring sedation that led to PEA arrest with short CPR and ROSC.   Significant Hospital Events   11/20 STEMI-stent LAD.  11/21 Afib  with RVR, s.p cardioversion  11/23 S/P DCCV of recurrent Afib. Intubated. Septic PNA and cardiogenic shock.  11/24 Brief brady arrest ADD: Start CRRT.  11/30 Stool output from mouth> Ileus.  12/2 EGD: Esophagitis. No bleeding and probable ischemic duodenitis  12/3 extubated.  12/4 CRRT d/c'd. Delirious  12/5 iHD  12/9 R CT inserted for pleural effusion.  12/11 Given a course of TPA and lytics via chest tube 12/12 Tx to [facility],  12/13 Pt turned over and pulled chest tube and there is leak, stat CXR ordered.  12/14 Chest tube replaced. Readmit to ICU with worsening pleural effusion and hypotension. Levo started.  12/19 Txf back to ICU for resp failure, failed bipap and was intubated.  12/22 Extubated.  Has tenuous respiratory status requiring BiPAP 12/23 Multidisciplinary goals of care meeting.  Remains full code. 12/25 Remains on Precedex due to anxiety, delirium.   Assessment & Plan    1.  Anterior MI: Status post culprit lesion PCI 5 weeks ago in November 21 with PCI to 99% LAD stenosis.  Patient has significant concomitant CAD with 80% calcified napkin ring proximal RCA stenosis at 99% AV groove circumflex stenosis.  Initial plan was to perform staged PCI to the RCA with medical therapy to the circumflex.  Presently he is without recurrent chest pain. Will defer plan for RCA PCI for now until more stable.  2.  PAF: Maintaining sinus rhythm on amiodarone 200 mg twice a day in addition to metoprolol 5 mg IV every 8 hours.  Since more alert today, will transition to metoprolol via tube rather than intravenous.  3.  Stage renal disease on hemodialysis on Monday Wednesday and Friday.  He is on the schedule to be done this afternoon.  Previously had developed increasing volume overload necessitating reintubation with prior delay.  4.  MSSA bacteremia: On Ancef 2 g IV with dialysis  5.  He continues to be on midodrine  20 mg every 8 hours.  Pharmacy to discuss with nephrology starting possible  dose reduction but will wait to see how he does after dialysis today.  6.  GI bleed: no recurrence  7. Anemia: H/H 7/22.3  Signed, Troy Sine, MD, [facility] 12/04/2020, 8:29 AM

## 2020-12-04 NOTE — Plan of Care (Signed)
  Problem: Education: Goal: Understanding of CV disease, CV risk reduction, and recovery process will improve Outcome: Progressing   Problem: Activity: Goal: Ability to return to baseline activity level will improve Outcome: Progressing   Problem: Cardiovascular: Goal: Ability to achieve and maintain adequate cardiovascular perfusion will improve Outcome: Progressing   Problem: Health Behavior/Discharge Planning: Goal: Ability to safely manage health-related needs after discharge will improve Outcome: Progressing   Problem: Education: Goal: Knowledge of General Education information will improve Description: Including pain rating scale, medication(s)/side effects and non-pharmacologic comfort measures Outcome: Progressing   Problem: Health Behavior/Discharge Planning: Goal: Ability to manage health-related needs will improve Outcome: Progressing   Problem: Clinical Measurements: Goal: Ability to maintain clinical measurements within normal limits will improve Outcome: Progressing Goal: Will remain free from infection Outcome: Progressing Goal: Diagnostic test results will improve Outcome: Progressing Goal: Respiratory complications will improve Outcome: Progressing Goal: Cardiovascular complication will be avoided Outcome: Progressing   Problem: Activity: Goal: Risk for activity intolerance will decrease Outcome: Progressing   Problem: Nutrition: Goal: Adequate nutrition will be maintained Outcome: Progressing   Problem: Coping: Goal: Level of anxiety will decrease Outcome: Progressing   Problem: Elimination: Goal: Will not experience complications related to bowel motility Outcome: Progressing   Problem: Pain Managment: Goal: General experience of comfort will improve Outcome: Progressing   Problem: Safety: Goal: Ability to remain free from injury will improve Outcome: Progressing   Problem: Skin Integrity: Goal: Risk for impaired skin integrity will  decrease Outcome: Progressing

## 2020-12-05 DIAGNOSIS — J9601 Acute respiratory failure with hypoxia: Secondary | ICD-10-CM | POA: Diagnosis not present

## 2020-12-05 DIAGNOSIS — N186 End stage renal disease: Secondary | ICD-10-CM | POA: Diagnosis not present

## 2020-12-05 DIAGNOSIS — D500 Iron deficiency anemia secondary to blood loss (chronic): Secondary | ICD-10-CM

## 2020-12-05 DIAGNOSIS — E785 Hyperlipidemia, unspecified: Secondary | ICD-10-CM | POA: Diagnosis not present

## 2020-12-05 DIAGNOSIS — I2109 ST elevation (STEMI) myocardial infarction involving other coronary artery of anterior wall: Secondary | ICD-10-CM | POA: Diagnosis not present

## 2020-12-05 DIAGNOSIS — R7881 Bacteremia: Secondary | ICD-10-CM | POA: Diagnosis not present

## 2020-12-05 DIAGNOSIS — I480 Paroxysmal atrial fibrillation: Secondary | ICD-10-CM

## 2020-12-05 LAB — PREPARE RBC (CROSSMATCH)

## 2020-12-05 LAB — GLUCOSE, CAPILLARY
Glucose-Capillary: 101 mg/dL — ABNORMAL HIGH (ref 70–99)
Glucose-Capillary: 101 mg/dL — ABNORMAL HIGH (ref 70–99)
Glucose-Capillary: 101 mg/dL — ABNORMAL HIGH (ref 70–99)
Glucose-Capillary: 116 mg/dL — ABNORMAL HIGH (ref 70–99)
Glucose-Capillary: 91 mg/dL (ref 70–99)
Glucose-Capillary: 92 mg/dL (ref 70–99)

## 2020-12-05 LAB — CBC

## 2020-12-05 LAB — HEPATITIS B SURFACE ANTIBODY,QUALITATIVE: Hep B S Ab: REACTIVE — AB

## 2020-12-05 LAB — HEPATITIS B SURFACE ANTIGEN: Hepatitis B Surface Ag: NEGATIVE — AB

## 2020-12-05 LAB — HEPATITIS B CORE ANTIBODY, TOTAL: Hep B Core Total Ab: NEGATIVE — AB

## 2020-12-05 MED ORDER — SENNA 8.6 MG PO TABS: 1.0000 | ORAL_TABLET | Freq: Every day | ORAL | Status: DC | PRN

## 2020-12-05 MED ORDER — TICAGRELOR 90 MG PO TABS
90.0000 mg | ORAL_TABLET | Freq: Two times a day (BID) | ORAL | Status: DC
  Administered 2020-12-05 – 2020-12-14 (×19): 90 mg via ORAL
  Filled 2020-12-05 (×20): qty 1

## 2020-12-05 MED ORDER — BACID PO TABS
1.0000 | ORAL_TABLET | Freq: Three times a day (TID) | ORAL | Status: DC
  Administered 2020-12-05 – 2020-12-14 (×26): 1 via ORAL
  Filled 2020-12-05 (×32): qty 1

## 2020-12-05 MED ORDER — ACETAMINOPHEN 325 MG PO TABS: 650.0000 mg | ORAL_TABLET | ORAL | Status: DC | PRN

## 2020-12-05 MED ORDER — METOPROLOL TARTRATE 25 MG/10 ML ORAL SUSPENSION: 37.5000 mg | Freq: Two times a day (BID) | ORAL | Status: DC

## 2020-12-05 MED ORDER — OXYCODONE HCL 5 MG/5ML PO SOLN
5.0000 mg | Freq: Three times a day (TID) | ORAL | Status: DC | PRN
  Administered 2020-12-05 – 2020-12-08 (×3): 5 mg via ORAL
  Filled 2020-12-05 (×3): qty 5

## 2020-12-05 MED ORDER — JUVEN PO PACK
1.0000 | PACK | Freq: Two times a day (BID) | ORAL | Status: DC
  Administered 2020-12-05 – 2020-12-14 (×15): 1 via ORAL
  Filled 2020-12-05 (×16): qty 1

## 2020-12-05 MED ORDER — DOCUSATE SODIUM 50 MG/5ML PO LIQD
100.0000 mg | Freq: Two times a day (BID) | ORAL | Status: DC
  Administered 2020-12-05: 21:00:00 100 mg via ORAL
  Filled 2020-12-05: qty 10

## 2020-12-05 MED ORDER — ATORVASTATIN CALCIUM 80 MG PO TABS
80.0000 mg | ORAL_TABLET | Freq: Every day | ORAL | Status: DC
  Administered 2020-12-05 – 2020-12-13 (×9): 80 mg via ORAL
  Filled 2020-12-05 (×9): qty 1

## 2020-12-05 MED ORDER — METOPROLOL TARTRATE 25 MG PO TABS
37.5000 mg | ORAL_TABLET | Freq: Two times a day (BID) | ORAL | Status: DC
  Administered 2020-12-05 – 2020-12-13 (×16): 37.5 mg via ORAL
  Filled 2020-12-05 (×18): qty 1

## 2020-12-05 MED ORDER — AMIODARONE HCL 200 MG PO TABS
200.0000 mg | ORAL_TABLET | Freq: Two times a day (BID) | ORAL | Status: DC
  Administered 2020-12-05 – 2020-12-14 (×19): 200 mg via ORAL
  Filled 2020-12-05 (×20): qty 1

## 2020-12-05 MED ORDER — SODIUM CHLORIDE 0.9% IV SOLUTION: Freq: Once | INTRAVENOUS | Status: AC

## 2020-12-05 MED ORDER — PROSOURCE PLUS PO LIQD
30.0000 mL | Freq: Three times a day (TID) | ORAL | Status: DC
  Administered 2020-12-05 – 2020-12-14 (×30): 30 mL via ORAL
  Filled 2020-12-05 (×29): qty 30

## 2020-12-05 MED ORDER — NEPRO/CARBSTEADY PO LIQD
600.0000 mL | ORAL | Status: DC
  Administered 2020-12-05: 18:00:00 600 mL

## 2020-12-05 MED ORDER — MELATONIN 5 MG PO TABS
5.0000 mg | ORAL_TABLET | Freq: Every day | ORAL | Status: DC
  Administered 2020-12-05 – 2020-12-13 (×9): 5 mg via ORAL
  Filled 2020-12-05 (×9): qty 1

## 2020-12-05 MED ORDER — POLYETHYLENE GLYCOL 3350 17 G PO PACK
17.0000 g | PACK | Freq: Every day | ORAL | Status: DC
  Administered 2020-12-07 – 2020-12-09 (×3): 17 g via ORAL
  Filled 2020-12-05 (×6): qty 1

## 2020-12-05 MED ORDER — PANTOPRAZOLE SODIUM 40 MG PO PACK
40.0000 mg | PACK | Freq: Two times a day (BID) | ORAL | Status: DC
  Administered 2020-12-05 (×2): 40 mg via ORAL
  Filled 2020-12-05 (×2): qty 20

## 2020-12-05 MED ORDER — RENA-VITE PO TABS
1.0000 | ORAL_TABLET | Freq: Every day | ORAL | Status: DC
  Administered 2020-12-05 – 2020-12-13 (×9): 1 via ORAL
  Filled 2020-12-05 (×10): qty 1

## 2020-12-05 MED ORDER — SEVELAMER CARBONATE 0.8 G PO PACK
1.6000 g | PACK | Freq: Three times a day (TID) | ORAL | Status: DC
  Administered 2020-12-06 – 2020-12-14 (×22): 1.6 g via ORAL
  Filled 2020-12-05 (×31): qty 2

## 2020-12-05 MED ORDER — QUETIAPINE FUMARATE 100 MG PO TABS
100.0000 mg | ORAL_TABLET | Freq: Every day | ORAL | Status: DC
  Administered 2020-12-05 – 2020-12-07 (×3): 100 mg via ORAL
  Filled 2020-12-05 (×3): qty 1

## 2020-12-05 NOTE — Progress Notes (Signed)
Progress Note  Patient Name: Travis [name] Date of Encounter: 12/05/2020  Primary Cardiologist: [name]  Subjective   More alert today  Inpatient Medications    Scheduled Meds: . sodium chloride   Intravenous Once  . amiodarone  200 mg Oral BID  . atorvastatin  80 mg Oral q1800  . Chlorhexidine Gluconate Cloth  6 each Topical Q0600  . collagenase   Topical Daily  . darbepoetin (ARANESP) injection - DIALYSIS  60 mcg Intravenous Q Mon-HD  . docusate  100 mg Oral BID  . doxercalciferol  5 mcg Intravenous Q M,W,F  . feeding supplement (PROSource TF)  90 mL Per Tube QID  . heparin injection (subcutaneous)  5,000 Units Subcutaneous Q8H  . lactobacillus acidophilus  1 tablet Oral TID  . mouth rinse  15 mL Mouth Rinse BID  . melatonin  5 mg Oral QHS  . metoprolol tartrate  25 mg Per Tube BID  . multivitamin  1 tablet Oral QHS  . nutrition supplement (JUVEN)  1 packet Per Tube BID BM  . pantoprazole sodium  40 mg Oral BID  . polyethylene glycol  17 g Oral Daily  . QUEtiapine  100 mg Oral QHS  . sevelamer carbonate  1.6 g Oral TID WC  . sodium chloride flush  3 mL Intravenous Q12H  . ticagrelor  90 mg Oral BID   Continuous Infusions: . sodium chloride 10 mL/hr (11/21/20 1722)  . sodium chloride 250 mL (11/27/20 0902)  . sodium chloride    .  ceFAZolin (ANCEF) IV Stopped (12/01/20 1046)   PRN Meds: sodium chloride, acetaminophen, heparin, LORazepam, ondansetron (ZOFRAN) IV, oxyCODONE, Resource ThickenUp Clear, senna, silver nitrate applicators, sodium chloride, sodium chloride flush     Vital Signs    Vitals:   12/05/20 0800 12/05/20 0819 12/05/20 0830 12/05/20 0926  BP: (!) 160/81   136/75  Pulse: 94  93 94  Resp: 16  (!) 29 18  Temp:  99.1 F (37.3 C)  98.4 F (36.9 C)  TempSrc:  Oral  Oral  SpO2: 99%  98% 97%  Weight:      Height:        Intake/Output Summary (Last 24 hours) at 12/05/2020 0942 Last data filed at 12/05/2020 0800 Gross per 24 hour   Intake 783 ml  Output 3000 ml  Net -2217 ml    I/O since admission: -4861  Filed Weights   12/04/20 1514 12/04/20 1919 12/05/20 0344  Weight: 64.2 kg 61.2 kg 78.4 kg    Telemetry    Sinus rhythmn in the 90s- Personally Reviewed  ECG    ECG (independently read by me):  NSR at 93, anterolateral infarct Q 1, aVL and anteriorly  Physical Exam   BP 136/75   Pulse 94   Temp 98.4 F (36.9 C) (Oral)   Resp 18   Ht 5\' 11"  (1.803 m)   Wt 78.4 kg   SpO2 97%   BMI 24.11 kg/m  General: Alert, oriented, no distress.  Skin: normal turgor, no rashes, warm and dry HEENT: Normocephalic, atraumatic. Pupils equal round and reactive to light; sclera anicteric; extraocular muscles intact;  Nose without nasal septal hypertrophy Mouth/Parynx benign; Mallinpatti scale 3 Neck: No JVD, no carotid bruits; normal carotid upstroke Lungs: clear to ausculatation and percussion; no wheezing or rales Chest wall: without tenderness to palpitation Heart: PMI not displaced, RRR, s1 s2 normal, 1/6 systolic murmur, no diastolic murmur, no rubs, gallops, thrills, or heaves Abdomen: soft, nontender; no hepatosplenomehaly,  BS+; abdominal aorta nontender and not dilated by palpation. Back: no CVA tenderness Pulses 2+ Musculoskeletal: full range of motion, normal strength, no joint deformities Extremities: mild feet edema builaterally; no clubbing cyanosis , Homan's sign negative  Neurologic: grossly nonfocal; Cranial nerves grossly wnl Psychologic: Normal mood and affect    Labs    Chemistry Recent Labs  Lab 12/02/20 0335 12/03/20 0057 12/04/20 0146  NA 133* 131* 131*  K 4.3 4.6 4.8  CL 92* 91* 90*  CO2 27 24 24   GLUCOSE 91 97 99  BUN 66* 100* 135*  CREATININE 5.44* 7.13* 8.99*  CALCIUM 10.1 10.3 10.0  ALBUMIN 1.9* 1.9* 1.8*  GFRNONAA 11* 8* 6*  ANIONGAP 14 16* 17*     Hematology Recent Labs  Lab 12/02/20 0335 12/03/20 0057 12/04/20 0146  WBC 17.5* 19.2* 16.5*  RBC 2.50* 2.50*  2.49*  HGB 7.3* 7.1* 7.0*  HCT 22.7* 23.4* 22.9*  MCV 90.8 93.6 92.0  MCH 29.2 28.4 28.1  MCHC 32.2 30.3 30.6  RDW 14.2 14.2 14.0  PLT 423* 450* 423*    Cardiac EnzymesNo results for input(s): TROPONINI in the last 168 hours. No results for input(s): TROPIPOC in the last 168 hours.   BNPNo results for input(s): BNP, PROBNP in the last 168 hours.   DDimer No results for input(s): DDIMER in the last 168 hours.   Lipid Panel     Component Value Date/Time   CHOL 151 10/29/2020 0101   TRIG 71 11/30/2020 0040   HDL 13 (L) 10/29/2020 0101   CHOLHDL 11.6 10/29/2020 0101   VLDL 77 (H) 10/29/2020 0101   LDLCALC 61 10/29/2020 0101     Radiology    No results found.  Cardiac Studies     Cath PCI 11/20: Acute anterolateral ST elevation MI with Culprit lesion being the 99% mid LAD lesion (just past major 1st Diag/D1) along with multivessel CAD including 99% ostial AV groove LCx and 80% calcified napkin ring proximal RCA lesion with extensive calcification throughout the RCA. ? Successful PTCA and DES PCI of the LAD crossing D1 -resolute Onyx DES 2.75 mm x 18 mm postdilated to 3.1 mm.  Mild to moderate reduced EF with anterior anterolateral hypokinesis, EF roughly 40 to 45%.  Initial evaluation suggested normal EDP, but on recheck post PCI LVEDP of 30 mmHg, severely elevated consistent with ACUTE DIASTOLIC HEART FAILURE  Significantly dilated aortic root requiring AL-1 guide catheter for both Left and Right Coronary Angiography   TTE 10/31/2020: EF 35 to 40%. Moderate decreased function. Moderate concentric LVH GR 3 DD. Severe HK of the mid apical inferolateral and inferior wall with moderate HK of the apical septal and anterior wall. PA pressure estimated 43 mmHg. Severe LA dilation. Moderate RA dilation. Moderate to severe MR. Aortic root dilated at 41 mm. RV PSP 15 mmHg.   LFemV Central line placed by [facility] 11/21  This is an purposes  TEE 11/01/2020: EF 35-40% moderate  decreased function. Severe LA dilation. No LAA thrombus. Moderate RA dilation. 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 Mild dilation of ascending aorta measuring 40 mm. Moderate grade 3 plaque..   11/01/2020: Right IJ dialysis catheter placed, right radial arterial line placed.  11/03/2020: CorPac placed inIR   Echo 11/26/2020: EF 45 to 50% with mildly decreased function.  Moderate concentric LVH and indeterminate filling.  Mild to moderate MR due to restricted PMV  L with mitral annular calcification III B.  Does appear to be    Patient Profile     59 y.o. male with ESRD on IHD, Ascending AoTAA - admitted with  Anterior STEMI 11/21 ->  PCI of the LAD. He has residual disease in his right coronary and AVG-Cx for planned PCI.  PTA was not feeing well for several days - chills & nausea.  On Cath - LVEDP 30-35 mmHg -> had been drinking lots of H20 & in settting of ACS - acute DHF --> developed pulmonary Edema = Acute HD.   Was then febrile PM 11/20-21 - > now 2/2 BC + Staph a (1 reported MSSA).  Overnight 11/21-22 -> went into Afib RVR - became hypotensive - Now in Septic Shock. after initial restoration of sinus rhythm, went back into A. fib RVR Now in Septic Shock.->Chest x-ray and CT of the chest on 11/23 confirm right lower lobe staph pneumonia-> DCCV on 10/31/2020->followed by intubation by late afternoon. Overnight 11/23-24, had significant agitation requiring sedation that led to PEA arrest with short CPR and ROSC.   Significant Hospital Events   11/20 STEMI-stent LAD.  11/21 Afib with RVR, s.p cardioversion  11/23 S/P DCCV of recurrent Afib. Intubated. Septic PNA and cardiogenic shock.  11/24 Brief brady arrest ADD: Start CRRT.  11/30 Stool output from mouth> Ileus.  12/2 EGD: Esophagitis. No bleeding and probable ischemic  duodenitis  12/3 extubated.  12/4 CRRT d/c'd. Delirious  12/5 iHD  12/9 R CT inserted for pleural effusion.  12/11 Given a course of TPA and lytics via chest tube 12/12 Tx to [facility],  12/13 Pt turned over and pulled chest tube and there is leak, stat CXR ordered.  12/14 Chest tube replaced. Readmit to ICU with worsening pleural effusion and hypotension. Levo started.  12/19 Txf back to ICU for resp failure, failed bipap and was intubated.  12/22 Extubated.  Has tenuous respiratory status requiring BiPAP 12/23 Multidisciplinary goals of care meeting.  Remains full code. 12/25 Remains on Precedex due to anxiety, delirium.   Assessment & Plan    1.  Anterior MI: Status post culprit lesion PCI 5 weeks ago on November 21 with PCI to 99% LAD stenosis.  Patient has significant concomitant CAD with 80% calcified napkin ring proximal RCA stenosis at 99% AV groove circumflex stenosis.  Initial plan was to perform staged PCI to the RCA with medical therapy to the circumflex.  Presently he is without recurrent chest pain. Will defer plan for RCA PCI for now until more stable particularly with recent GI bleed and need for anticoagulation for intervention. No angina symptoms.   2.  PAF: Maintaining sinus rhythm on amiodarone 200 mg twice a day and was transitioned to oral metoprolol via cortrak. Will increase metoprolol to 37.5 mg with evening dose tonight  3.  Stage renal disease on hemodialysis on Monday Wednesday and Friday.  He is on the schedule to be done this afternoon.  Previously had developed increasing volume overload necessitating reintubation with prior delay.  4.  MSSA bacteremia: On Ancef 2 g IV with dialysis' plan for prolonged 6 week course through 12/19/2020.  5.  Yesterday am he was on midodrine 20 mg every 8 hours.  BP currently stable; midodrine was dc later yesterday with stable BP   6.  GI bleed: due to erosive esophagitis and duodenal ulceration; no recurrence  7. Anemia: H/H  7/22.3; Currently getting a unit PRBC ; will check f/u CBC   Signed,  Troy Sine, MD, [facility] 12/05/2020, 9:42 AM

## 2020-12-05 NOTE — Progress Notes (Signed)
[place] Progress Note Patient Name: Travis [name] DOB: [birth year] MRN: [id number]   Date of Service  12/05/2020  HPI/Events of Note  Anemia - Hgb = 7.0  eICU Interventions  Will transfuse 1 unit PRBC now.      Intervention Category Major Interventions: Other:  [name] 12/05/2020, 6:44 AM

## 2020-12-05 NOTE — Progress Notes (Signed)
NAME:  Travis [name], MRN:  [id number], DOB:  12-15-1960, LOS: 49 ADMISSION DATE:  10/28/2020, CONSULTATION DATE:  10/29/20 REFERRING MD:  Cardiology - [name], CHIEF COMPLAINT:  Hypotension, gram positive cocci on culture, AMS  Brief History   Patient is a 59 y.o. male with history of ESRD, HTN, CAD-who presented with anterior STEMI-treated with DES to LAD-hospital course prolonged and complicated by atrial fibrillation with RVR requiring cardioversion x2,, septic shock due to MSSA bacteremia, brief PEA arrest requiring CPR, acute hypoxic respiratory failure requiring intubation, upper GI bleed with acute blood loss anemia due to severe esophagitis requiring PRBC transfusion, severe metabolic encephalopathy/ICU delirium, bilateral lung abscesses with empyema requiring chest tube insertion   Past Medical History  HTN ESRD, HD MWF  Significant Hospital Events   11/20 STEMI-stent LAD.  11/21 Afib with RVR, s.p cardioversion  11/23 S/P DCCV of recurrent Afib. Intubated. Septic PNA and cardiogenic shock.  11/24 Brief brady arrest ADD: Start CRRT.  11/30 Stool output from mouth> Ileus.  12/2 EGD: Esophagitis. No bleeding and probable ischemic duodenitis  12/3 extubated.  12/4 CRRT d/c'd. Delirious  12/5 iHD  12/9 R CT inserted for pleural effusion.  12/11 Given a course of TPA and lytics via chest tube 12/12 Tx to [facility],  12/13 Pt turned over and pulled chest tube and there is leak, stat CXR ordered.  12/14 Chest tube replaced. Readmit to ICU with worsening pleural effusion and hypotension. Levo started.  12/19 Txf back to ICU for resp failure, failed bipap and was intubated.  12/22 Extubated.  Has tenuous respiratory status requiring BiPAP 12/23 Multidisciplinary goals of care meeting.  Remains full code. 12/25 Remains on Precedex due to anxiety, delirium.  Consults:  PCCM  Nephrology ID  Procedures:  Central line 11/21 >> 11/29 Arterial line 11/21 out Repeat DCCV 11/23 ETT  11/23 > 12/3 ETT 12/12 > 12/22 HD line 11/24 RIJ >> Arterial line 11/24 out L  CVC 11/30>>  Right-sided chest tube 12/14>> 12/19  Significant Diagnostic Tests:   11/24 TEE>>Left ventricular ejection fraction, by estimation, is 35 to 40%. The  left ventricle has moderately decreased function.Moderate to severe mitral regurgitation.  11/23 CT Chest>>Extensive multifocal nodular and patchy airspace disease in both lungs with a slight peripheral predominance in the upper lungs. 3.1 cm nodular consolidative opacity in the right upper lobe is cavitated. Imaging features likely related to multifocal pneumonia. Septic emboli and metastatic disease considered less likely but not excluded.  11/22 LUE Vas Upper Extremity Doppler>> Arteriovenous fistula-Aneurysmal dilatation noted.  11/24 CT abdomen - no retroperitoneal hematoma  11/29 MR Brain>> No evidence acute intracranial abnormality, sinisitis, mastoid effusions  11/29>> MR Cervical Spine>> Given the provided history of bacteremia, facet joint septic arthritis is difficult to definitively exclude, but the lack of any surrounding marrow edema argues against this Cervical spondylosis   11/29 MR Thoracic Spine No significant marrow edema in thoracic spine , Mild thoracic spondylosis hypointense marrow signal throughout the thoracic spine, likely related to the patient's end-stage renal disease. multifocal airspace disease and cavitary pulmonary lesions. Small right pleural effusion.  11/29 MR Lumbar Spine:As noted above and Suspected cholecystolithiasis  12/11 CT chest  improving air space disease - R empyema persists but adequate chest tube position.    Micro Data:  11/21 BCx2>>Staph aureus > MSSA by BCID 11/22 BCx 2 >> neg 11/23 trach asp >> staph aureus 11/24 BCx2>> neg 11/30 BC x 2>> staph epi>> MRSA 12/2/ BCx 2 >> neg 12/9 right  pleural fluid >> negative  Antimicrobials:  Zosyn 11/21 x 1 Ancef 11/21 ->   Interim  history/subjective:   Remains off Precedex.  Tolerated hemodialysis yesterday.  Successfully weaned down to 4 L nasal cannula.  Objective   Blood pressure 136/75, pulse 94, temperature 98.4 F (36.9 C), temperature source Oral, resp. rate 18, height 5\' 11"  (1.803 m), weight 78.4 kg, SpO2 97 %.        Intake/Output Summary (Last 24 hours) at 12/05/2020 1000 Last data filed at 12/05/2020 0800 Gross per 24 hour  Intake 683 ml  Output 3000 ml  Net -2317 ml   Filed Weights   12/04/20 1514 12/04/20 1919 12/05/20 0344  Weight: 64.2 kg 61.2 kg 78.4 kg   Physical exam  Gen:      No acute distress HEENT:  EOMI, sclera anicteric Neck:     No masses; no thyromegaly Lungs:    Clear to auscultation bilaterally; normal respiratory effort CV:         Regular rate and rhythm; no murmurs Abd:      + bowel sounds; soft, non-tender; no palpable masses, no distension Ext:    No edema; adequate peripheral perfusion Skin:      Warm and dry; no rash Neuro:  Awake, oriented, generalized weakness  Resolved Hospital Problem list   Parapneumonic effusion status post chest tube placement with DNase and TPA Chest tube removed 12/19  Assessment & Plan:  Acute hypoxemic respiratory insufficiency. Likely secondary to volume overload CTA chest shows bilateral groundglass opacities consistent with likely pulmonary edema  Tolerated extubation morning of 12/22 Anxiety is a major component and he remains on and off Precedex drip If he is reintubated then will need a tracheostomy. -Ready for transfer to PCU  ESRD on HD- MWF -Continue hemodialysis per nephrology  Anterior ST elevation MI s/p DES 11/20 Ischemic cardiomyopathy with an ejection fraction of 40 to 45% Paroxysmal atrial fibrillation Anticoagulation on hold due to recent GI bleed Mitral regurgitation Cardiology following, appreciate assistance Continue Brilinta, statin, and amiodarone Remains not a candidate for anticoagulation given recent  GI bleed Continue antiplatelet therapy Will need distal RCA intervention once stabilized Continuous telemetry  MSSA bacteremia with lung seeding.  Evaluated by infectious disease during hospitalization, recommendations made for prolonged antibiotic coverage -Continue cefazolin x6 weeks with stop date in place  Unstageable sacral ulcer Continue hydrotherapy as able WOC consulted Pressure alleviating devices Frequent every 2 hours turns  Generalized deconditioning PT/OT as able  Anemia of chronic illness Anemia of critical illness Recent GI bleed  Status post EGD showing severe esophagitis and duodenal ulceration Trend CBC Transfuse per protocol Hemoglobin goal greater than 8  Anxiety, ICU delirium Got off Precedex drip.  Continues to be delirious Continue Seroquel, melatonin at night   Daily Goals Checklist  Pain/Anxiety/Delirium protocol (if indicated): seroquel VAP protocol (if indicated): not intubated Respiratory support goals: incentive spirometry, wean O2 Blood pressure target: keep MAP> 65 DVT prophylaxis heparin tid Nutrition Status: high nutritional risk - continue tube feeds until oral intake adequate. GI prophylaxis: PPI bid given recent bleed Fluid status goals: per nephrology Urinary catheter: not required. Central lines: PIV only Glucose control: adequate control on no therapy. Mobility/therapy needs: PT/OT Antibiotic de-escalation: Complete 6 weeks of cefazolin  - now day  Home medication reconciliation: reviewed.  Medications transition to oral Daily labs: CBC and renal panel on HD only. Code Status: full  Family Communication: wife updated at the bedside Disposition: Transfer to PCU, order reconciliation performed.  TRH notified  Goals of Care:  Last date of multidisciplinary goals of care discussion: 12/25 Family and staff present: Re dicussed with Wife, patient and nurse Summary of discussion: Family wants to continue aggressive care. Do not  want to transfer out of ICU unless stable as he has several bounce backs  Follow up goals of care discussion due: 1/1 Code Status: Full  35 minutes spent with greater than 50% of time in counseling and coordination of care.  Kipp Brood, MD [facility] ICU Physician [employer]  Pager: [phone] Or Epic Secure Chat After hours: [phone].  12/05/2020, 10:00 AM

## 2020-12-05 NOTE — Plan of Care (Signed)
Problem: Cardiovascular: Goal: Ability to achieve and maintain adequate cardiovascular perfusion will improve 12/05/2020 0106 by Charlynne Cousins, RN Outcome: Progressing 12/05/2020 0106 by Charlynne Cousins, RN Outcome: Progressing  Problem: Education: Goal: Knowledge of General Education information will improve Description: Including pain rating scale, medication(s)/side effects and non-pharmacologic comfort measures 12/05/2020 0106 by Charlynne Cousins, RN Outcome: Progressing 12/05/2020 0106 by Charlynne Cousins, RN Outcome: Progressing   Problem: Health Behavior/Discharge Planning: Goal: Ability to manage health-related needs will improve 12/05/2020 0106 by Charlynne Cousins, RN Outcome: Progressing 12/05/2020 0106 by Charlynne Cousins, RN Outcome: Progressing   Goal: Will remain free from infection Outcome: Progressing  Problem: Activity: Goal: Risk for activity intolerance will decrease Outcome: Progressing   Problem: Nutrition: Goal: Adequate nutrition will be maintained Outcome: Progressing   Problem: Coping: Goal: Level of anxiety will decrease Outcome: Progressing   Problem: Elimination: Goal: Will not experience complications related to bowel motility Outcome: Progressing   Problem: Pain Managment: Goal: General experience of comfort will improve Outcome: Progressing   Problem: Safety: Goal: Ability to remain free from injury will improve Outcome: Progressing   Problem: Skin Integrity: Goal: Risk for impaired skin integrity will decrease Outcome: Progressing

## 2020-12-05 NOTE — Plan of Care (Signed)
  Problem: Education: Goal: Understanding of CV disease, CV risk reduction, and recovery process will improve Outcome: Progressing   Problem: Activity: Goal: Ability to return to baseline activity level will improve Outcome: Progressing   Problem: Cardiovascular: Goal: Ability to achieve and maintain adequate cardiovascular perfusion will improve Outcome: Progressing   Problem: Health Behavior/Discharge Planning: Goal: Ability to safely manage health-related needs after discharge will improve Outcome: Progressing   Problem: Education: Goal: Knowledge of General Education information will improve Description: Including pain rating scale, medication(s)/side effects and non-pharmacologic comfort measures Outcome: Progressing   Problem: Health Behavior/Discharge Planning: Goal: Ability to manage health-related needs will improve Outcome: Progressing   Problem: Clinical Measurements: Goal: Ability to maintain clinical measurements within normal limits will improve Outcome: Progressing Goal: Will remain free from infection Outcome: Progressing Goal: Diagnostic test results will improve Outcome: Progressing Goal: Respiratory complications will improve Outcome: Progressing Goal: Cardiovascular complication will be avoided Outcome: Progressing   Problem: Activity: Goal: Risk for activity intolerance will decrease Outcome: Progressing   Problem: Nutrition: Goal: Adequate nutrition will be maintained Outcome: Progressing   Problem: Coping: Goal: Level of anxiety will decrease Outcome: Progressing   Problem: Elimination: Goal: Will not experience complications related to bowel motility Outcome: Progressing   Problem: Pain Managment: Goal: General experience of comfort will improve Outcome: Progressing   Problem: Safety: Goal: Ability to remain free from injury will improve Outcome: Progressing   Problem: Skin Integrity: Goal: Risk for impaired skin integrity will  decrease Outcome: Progressing

## 2020-12-05 NOTE — Progress Notes (Signed)
Patient ID: Travis [name], male   DOB: 10-Aug-1961, 59 y.o.   MRN: [id number]  [facility] Progress Note   Assessment/ Plan:   1. Acute hypoxic respiratory failure: CT showed diffuse perihilar GG changes, most likely pulm edema/ vol overload. Better, extubated on 12/22.  2. ESRD: on HD MWF schedule. SP CRRT x 2 rounds. Heparin not being used due to significant ABLA/GI bleed recently.  Quite catabolic. HD tomorrow.  3. Volume: recurrent issues in hospital w/ fluid overload and resp distress. Have d/w pt and family, signs on the door about fluid restriction.  4. Anemia: Multifactorial with acute illness and now likely complicated by malnutrition/inflammatory complex with sacral decubitus.  Continue ESA darbe 60 ug q wk on Mondays, w/ as needed iron.  Recently worsened GI bleed secondary to duodenal ulcers and esophagitis.  Currently on sucralfate, have ordered a stop date of 12/31 to limit aluminum toxicity. 5. CKD-MBD: Calcium and phosphorus level currently at goal, continue strategies to optimize nutrition while on sevelamer 3 times daily AC. 6. Nutrition: Ongoing tube feeds at this time with protein supplementation to help improve healing of large sacral decubitus. 7. History of acute STEMI: admit diagnosis, sp PCI to LAD on 10/2019.  On Brilinta due to multifocal/stable disease. 8. Sacral decubitus (unstageable): Wound care with hydrotherapy and pressure alleviating devices. Maintain optimization of nutritional status to help wound healing. 9. Bilat MSSA cavitary PNA w/ empyema: earlier this admit, rx'd w/ abx and chest tubes.   Kelly Splinter, MD 12/05/2020, 1:14 PM    Subjective:   Seen in ICU, in bed, no c/o today. 3L off w/ HD yest   Objective:   BP 128/69 (BP Location: Right Wrist)   Pulse 94   Temp 99.8 F (37.7 C) (Oral)   Resp (!) 33   Ht 5\' 11"  (1.803 m)   Wt 78.4 kg   SpO2 93%   BMI 24.11 kg/m   Physical Exam: Gen: Awake, alert CVS: RRR no RG Resp:  lungs clear bilat no rales Abd: Soft, nondistended, bowel sounds normal Ext: 1-2+ pedal edema bilat  LUA AVF+bruit  OP HD: AF MWF   4h  450/800  81.5kg  2/2.25 bath  RUE AVF  Heparin on hold for inpt GI bleeding issues (was on 5000u bolus+ 1000 midrun)   - hect 5 ug tiw   -  mircera 50 ug q 4, last 11/15 (due 11/29)   Labs: BMET Recent Labs  Lab 11/29/20 0057 11/30/20 0040 12/01/20 0732 12/02/20 0335 12/03/20 0057 12/04/20 0146  NA 134* 133* 132* 133* 131* 131*  K 3.8 3.7 4.5 4.3 4.6 4.8  CL 95* 96* 92* 92* 91* 90*  CO2 25 25 25 27 24 24   GLUCOSE 128* 136* 138* 91 97 99  BUN 40* 42* 82* 66* 100* 135*  CREATININE 6.88* 4.92* 7.01* 5.44* 7.13* 8.99*  CALCIUM 9.1 9.5 9.7 10.1 10.3 10.0  PHOS 4.3 3.4 4.1 3.7 4.7* 5.4*   CBC Recent Labs  Lab 12/01/20 0709 12/02/20 0335 12/03/20 0057 12/04/20 0146  WBC 20.9* 17.5* 19.2* 16.5*  HGB 7.3* 7.3* 7.1* 7.0*  HCT 23.7* 22.7* 23.4* 22.9*  MCV 91.9 90.8 93.6 92.0  PLT 405* 423* 450* 423*      Medications:    . (feeding supplement) PROSource Plus  30 mL Oral TID PC & HS  . amiodarone  200 mg Oral BID  . atorvastatin  80 mg Oral q1800  . Chlorhexidine Gluconate Cloth  6 each Topical  [id number]  . collagenase   Topical Daily  . darbepoetin (ARANESP) injection - DIALYSIS  60 mcg Intravenous Q Mon-HD  . docusate  100 mg Oral BID  . doxercalciferol  5 mcg Intravenous Q M,W,F  . heparin injection (subcutaneous)  5,000 Units Subcutaneous Q8H  . lactobacillus acidophilus  1 tablet Oral TID  . mouth rinse  15 mL Mouth Rinse BID  . melatonin  5 mg Oral QHS  . metoprolol tartrate  37.5 mg Oral BID  . multivitamin  1 tablet Oral QHS  . nutrition supplement (JUVEN)  1 packet Oral BID BM  . pantoprazole sodium  40 mg Oral BID  . polyethylene glycol  17 g Oral Daily  . QUEtiapine  100 mg Oral QHS  . sevelamer carbonate  1.6 g Oral TID WC  . sodium chloride flush  3 mL Intravenous Q12H  . ticagrelor  90 mg Oral BID

## 2020-12-05 NOTE — Progress Notes (Signed)
Nutrition Follow-up  DOCUMENTATION CODES:   Severe malnutrition in context of chronic illness  INTERVENTION:   Calorie Count to assess po intake. If adequate, recommend removing Cortrak. If inadequate, recommend continuing Cortrak and nocturnal TF supplementation  30 ml ProSource Plus QID, each supplement provides 100 kcals and 15 grams protein.   Resume Juven BID, each packet provides 80 calories, 8 grams of carbohydrate, 2.5  grams of protein (collagen), 7 grams of L-arginine and 7 grams of L-glutamine; supplement contains CaHMB, Vitamins C, E, B12 and Zinc to promote wound healing  Nocturnal TF via Cortrak: Resume Nepro at 50 ml/hr x 12 hours Provides 430 mL free water, 1080 kcals and 49 g of protein  Continue Renal MVI daily  NUTRITION DIAGNOSIS:   Severe Malnutrition related to chronic illness as evidenced by severe muscle depletion,severe fat depletion.  Being addressed via supplements  GOAL:   Patient will meet greater than or equal to 90% of their needs  Progressing  MONITOR:   Diet advancement,TF tolerance,Skin,Labs  REASON FOR ASSESSMENT:   Consult Enteral/tube feeding initiation and management  ASSESSMENT:   Patient with PMH significant for HTN and ESRD in HD. Presents this admission with STEMI.  11/20 PCI to LAD 11/23Intubated 11/24 OG tube in esophagus, unable to advance 11/26 Cortrak attempted but unable to advance past 55 cm, likely at GE junction. DR advanced to duodenal bulb; TF initiated 11/30 Brown malodorous output from mouth and OG, TF held, CT negative for obstruction, ileus with no BM x 10 days. Abd xray with OG tube GE junction and Cortrak now retraced back into stomach; both needing advancement 12/01 iHD not tolerated, CRRT to resume, +large amount of liquid stool post enema, Attempted Cortrak advancement butunsuccessful 12/02 pt with large volume blood from OG tube; s/p EGD with noted severe esophagitis and erosions/ulcerations in  duodenal bulb compatible with ischemia (probable ischemic duodenitis)  12/3 extubated; attempted cortrak placement unsuccessful 12/09 Chest tube inserted, SLP advanced diet to Dys 3/Nectar thick 12/14 Diet advanced to Dysphagia 2, Thins 12/19 Re-intubated 12/21 Cortrak placed  12/22 Extubated 12/24 Diet advanced to Dysphagia 3, Thins  Plan to transfer out of ICU today. Receiving unit of PRBCs today. Currently on 4L [place]. Pt remains full code  Hydrotherapy discontinued yesterday due to significant progress with wound healing. Wound measures 10 cm x 15 cm x 3.1 cm with only 15% slough remaining. Pt sill requires significant amount of nutrition to continue to promote wound healing however.   Cortrak tube remains in place. TF were discontinued several days ago by MD  No recorded po intake since 12/14 but per RN and family, pt has been eating relatively well. Plan to start formal Calorie Count to better assess nutritional intake.   Pt placed on 1000 mL fluid restriction on 12/27. Plan to attempt to minimize free water volume from nutritional supplements and TF while providing adequate nutrition  Pt has been taking the Juven and Pro-Source supplements but RD noted these were discontinued today. Plan to resume given increased nutritional needs secondary to prolonged acute illness and wound healing. Pro-Source is only 30 mL volume and contains very little water.   Vitamin Lab 12/17:  Vitamin A: 60.7 (wdl) Vitamin C: 1.9 (wdl) Zinc: 63 (wdl) Folate: 14.0 (wdl)  Labs: sodium 131 (L), BUN 135 Meds: renvela with meals, renal MVI, lactobacillus acidophilus, aranesp  Diet Order:   Diet Order            DIET DYS 3 Room service appropriate? Yes;  Fluid consistency: Thin; Fluid restriction: Other (see comments)  Diet effective now                 EDUCATION NEEDS:   No education needs have been identified at this time  Skin:  Skin Assessment: Skin Integrity Issues: Skin Integrity Issues::  Incisions,Unstageable,Stage IV Stage II: n/a Stage IV: bilateral buttocks Unstageable: bilateral buttocks Incisions: L arm  Last BM:  12/28 (rectal tube out)  Height:   Ht Readings from Last 1 Encounters:  11/26/20 5\' 11"  (1.803 m)    Weight:   Wt Readings from Last 1 Encounters:  12/05/20 78.4 kg   BMI:  Body mass index is 24.11 kg/m.  Estimated Nutritional Needs:   Kcal:  2250-2500  Protein:  135-160 g  Fluid:  1000 mL plus UOP   Kerman Passey MS, RDN, LDN, CNSC Registered Dietitian III Clinical Nutrition RD Pager and On-Call Pager Number Located in [place]

## 2020-12-06 DIAGNOSIS — I10 Essential (primary) hypertension: Secondary | ICD-10-CM | POA: Diagnosis not present

## 2020-12-06 DIAGNOSIS — I25110 Atherosclerotic heart disease of native coronary artery with unstable angina pectoris: Secondary | ICD-10-CM | POA: Diagnosis not present

## 2020-12-06 DIAGNOSIS — N186 End stage renal disease: Secondary | ICD-10-CM | POA: Diagnosis not present

## 2020-12-06 DIAGNOSIS — I2109 ST elevation (STEMI) myocardial infarction involving other coronary artery of anterior wall: Secondary | ICD-10-CM | POA: Diagnosis not present

## 2020-12-06 LAB — CBC
HCT: 26.3 % — ABNORMAL LOW (ref 39.0–52.0)
Hemoglobin: 8 g/dL — ABNORMAL LOW (ref 13.0–17.0)
MCH: 28.1 pg (ref 26.0–34.0)
MCHC: 30.4 g/dL (ref 30.0–36.0)
MCV: 92.3 fL (ref 80.0–100.0)
Platelets: 353 10*3/uL (ref 150–400)
RBC: 2.85 MIL/uL — ABNORMAL LOW (ref 4.22–5.81)
RDW: 13.8 % (ref 11.5–15.5)
WBC: 11 10*3/uL — ABNORMAL HIGH (ref 4.0–10.5)
nRBC: 0 % (ref 0.0–0.2)

## 2020-12-06 LAB — RENAL FUNCTION PANEL
Albumin: 1.8 g/dL — ABNORMAL LOW (ref 3.5–5.0)
Anion gap: 15 (ref 5–15)
BUN: 101 mg/dL — ABNORMAL HIGH (ref 6–20)
CO2: 24 mmol/L (ref 22–32)
Calcium: 10.3 mg/dL (ref 8.9–10.3)
Chloride: 93 mmol/L — ABNORMAL LOW (ref 98–111)
Creatinine, Ser: 7.94 mg/dL — ABNORMAL HIGH (ref 0.61–1.24)
GFR, Estimated: 7 mL/min — ABNORMAL LOW (ref >60)
Glucose, Bld: 98 mg/dL (ref 70–99)
Phosphorus: 5.6 mg/dL — ABNORMAL HIGH (ref 2.5–4.6)
Potassium: 4.2 mmol/L (ref 3.5–5.1)
Sodium: 132 mmol/L — ABNORMAL LOW (ref 135–145)

## 2020-12-06 LAB — GLUCOSE, CAPILLARY
Glucose-Capillary: 99 mg/dL (ref 70–99)
Glucose-Capillary: 82 mg/dL (ref 70–99)
Glucose-Capillary: 97 mg/dL (ref 70–99)
Glucose-Capillary: 96 mg/dL (ref 70–99)
Glucose-Capillary: 94 mg/dL (ref 70–99)

## 2020-12-06 LAB — BPAM RBC
Blood Product Expiration Date: 202201312359
ISSUE DATE / TIME: 202112280922
Unit Type and Rh: 5100

## 2020-12-06 LAB — TYPE AND SCREEN
ABO/RH(D): O POS
Antibody Screen: NEGATIVE
Unit division: 0

## 2020-12-06 MED ORDER — DOCUSATE SODIUM 100 MG PO CAPS
100.0000 mg | ORAL_CAPSULE | Freq: Two times a day (BID) | ORAL | Status: DC
  Administered 2020-12-06 – 2020-12-14 (×13): 100 mg via ORAL
  Filled 2020-12-06 (×14): qty 1

## 2020-12-06 MED ORDER — PANTOPRAZOLE SODIUM 40 MG PO TBEC
40.0000 mg | DELAYED_RELEASE_TABLET | Freq: Two times a day (BID) | ORAL | Status: DC
  Administered 2020-12-06 – 2020-12-14 (×17): 40 mg via ORAL
  Filled 2020-12-06 (×18): qty 1

## 2020-12-06 MED ORDER — DOXERCALCIFEROL 4 MCG/2ML IV SOLN
INTRAVENOUS | Status: AC
  Administered 2020-12-06: 5 ug via INTRAVENOUS
  Filled 2020-12-06: qty 4

## 2020-12-06 NOTE — Progress Notes (Signed)
PT Cancellation Note  Patient Details Name: MATHAYUS STANBERY MRN: [id number] DOB: 05/07/1961   Cancelled Treatment:    Reason Eval/Treat Not Completed: (P) Patient at procedure or test/unavailable Pt is off the floor for dialysis. PT will follow back for treatment this afternoon as able.Cassandra Mcmanaman B. Migdalia Dk PT, DPT Acute Rehabilitation Services Pager [phone] Office [phone]    [place] 12/06/2020, 8:02 AM

## 2020-12-06 NOTE — Plan of Care (Signed)
  Problem: Education: Goal: Understanding of CV disease, CV risk reduction, and recovery process will improve Outcome: Progressing   Problem: Activity: Goal: Ability to return to baseline activity level will improve Outcome: Progressing   Problem: Cardiovascular: Goal: Ability to achieve and maintain adequate cardiovascular perfusion will improve Outcome: Progressing   Problem: Health Behavior/Discharge Planning: Goal: Ability to safely manage health-related needs after discharge will improve Outcome: Progressing   Problem: Education: Goal: Knowledge of General Education information will improve Description: Including pain rating scale, medication(s)/side effects and non-pharmacologic comfort measures Outcome: Progressing   Problem: Health Behavior/Discharge Planning: Goal: Ability to manage health-related needs will improve Outcome: Progressing   Problem: Clinical Measurements: Goal: Ability to maintain clinical measurements within normal limits will improve Outcome: Progressing Goal: Will remain free from infection Outcome: Progressing Goal: Diagnostic test results will improve Outcome: Progressing Goal: Respiratory complications will improve Outcome: Progressing Goal: Cardiovascular complication will be avoided Outcome: Progressing   Problem: Activity: Goal: Risk for activity intolerance will decrease Outcome: Progressing   Problem: Nutrition: Goal: Adequate nutrition will be maintained Outcome: Progressing   Problem: Nutrition: Goal: Adequate nutrition will be maintained Outcome: Progressing   Problem: Coping: Goal: Level of anxiety will decrease Outcome: Progressing   Problem: Elimination: Goal: Will not experience complications related to bowel motility Outcome: Progressing   Problem: Pain Managment: Goal: General experience of comfort will improve Outcome: Progressing   Problem: Safety: Goal: Ability to remain free from injury will  improve Outcome: Progressing   Problem: Skin Integrity: Goal: Risk for impaired skin integrity will decrease Outcome: Progressing

## 2020-12-06 NOTE — Progress Notes (Signed)
Sacral wound dressing changed, patient tolerated well. Patient ambulated with moderate assistance to recliner.

## 2020-12-06 NOTE — Progress Notes (Signed)
Brief Nutrition Note  Attempted to obtain meal slips for 48-hour calorie count ordered yesterday 12/28 at 1048. No calorie count envelope on pt's door and no meal slips available. No meal completions recorded in pt's chart since 11/21/20 so unable to use that information to estimate PO intake over the last 24 hours.  RD reordered 48-hour calorie count to start today at 1200 with lunch meal. Sent secure message to RN regarding calorie count order.  Calorie count instructions: Please hang calorie count envelope on the patient's door. Document percent consumed for each item on the patient's meal tray ticket and keep in envelope. Also document percent of any supplement or snack pt consumes and keep documentation in envelope for RD to review.  RD will follow up tomorrow 12/30 to provide results for the first 24 hours of calorie count.   Travis Bryant, MS, RD, LDN Inpatient Clinical Dietitian Please see [facility] for contact information.

## 2020-12-06 NOTE — Progress Notes (Signed)
SLP Cancellation Note  Patient Details Name: Travis [name] MRN: [id number] DOB: 06-15-1961   Cancelled treatment:       Reason Eval/Treat Not Completed: Patient at procedure or test/unavailable (HD this morning). Will f/u as able.   Osie Bond., M.A. [place] Acute Rehabilitation Services Pager [phone] Office [phone]  12/06/2020, 11:04 AM

## 2020-12-06 NOTE — Plan of Care (Signed)
  Problem: Education: Goal: Understanding of CV disease, CV risk reduction, and recovery process will improve Outcome: Progressing   Problem: Activity: Goal: Ability to return to baseline activity level will improve Outcome: Progressing   Problem: Cardiovascular: Goal: Ability to achieve and maintain adequate cardiovascular perfusion will improve Outcome: Progressing   Problem: Health Behavior/Discharge Planning: Goal: Ability to safely manage health-related needs after discharge will improve Outcome: Progressing   Problem: Education: Goal: Knowledge of General Education information will improve Description: Including pain rating scale, medication(s)/side effects and non-pharmacologic comfort measures Outcome: Progressing   Problem: Health Behavior/Discharge Planning: Goal: Ability to manage health-related needs will improve Outcome: Progressing   Problem: Clinical Measurements: Goal: Ability to maintain clinical measurements within normal limits will improve Outcome: Progressing Goal: Will remain free from infection Outcome: Progressing Goal: Diagnostic test results will improve Outcome: Progressing Goal: Respiratory complications will improve Outcome: Progressing Goal: Cardiovascular complication will be avoided Outcome: Progressing   Problem: Activity: Goal: Risk for activity intolerance will decrease Outcome: Progressing   Problem: Nutrition: Goal: Adequate nutrition will be maintained Outcome: Progressing   Problem: Coping: Goal: Level of anxiety will decrease Outcome: Progressing   Problem: Elimination: Goal: Will not experience complications related to bowel motility Outcome: Progressing   Problem: Pain Managment: Goal: General experience of comfort will improve Outcome: Progressing   Problem: Safety: Goal: Ability to remain free from injury will improve Outcome: Progressing   Problem: Skin Integrity: Goal: Risk for impaired skin integrity will  decrease Outcome: Progressing

## 2020-12-06 NOTE — Progress Notes (Addendum)
Progress Note  Patient Name: Travis [name] Date of Encounter: 12/06/2020  [facility] Cardiologist: Glenetta Hew, MD   Subjective   Had HD today.   Inpatient Medications    Scheduled Meds: . (feeding supplement) PROSource Plus  30 mL Oral TID PC & HS  . amiodarone  200 mg Oral BID  . atorvastatin  80 mg Oral q1800  . Chlorhexidine Gluconate Cloth  6 each Topical Q0600  . collagenase   Topical Daily  . darbepoetin (ARANESP) injection - DIALYSIS  60 mcg Intravenous Q Mon-HD  . docusate sodium  100 mg Oral BID  . doxercalciferol  5 mcg Intravenous Q M,W,F  . feeding supplement (NEPRO CARB STEADY)  600 mL Per Tube Q24H  . heparin injection (subcutaneous)  5,000 Units Subcutaneous Q8H  . lactobacillus acidophilus  1 tablet Oral TID  . mouth rinse  15 mL Mouth Rinse BID  . melatonin  5 mg Oral QHS  . metoprolol tartrate  37.5 mg Oral BID  . multivitamin  1 tablet Oral QHS  . nutrition supplement (JUVEN)  1 packet Oral BID BM  . pantoprazole  40 mg Oral BID  . polyethylene glycol  17 g Oral Daily  . QUEtiapine  100 mg Oral QHS  . sevelamer carbonate  1.6 g Oral TID WC  . sodium chloride flush  3 mL Intravenous Q12H  . ticagrelor  90 mg Oral BID   Continuous Infusions: . sodium chloride 10 mL/hr (11/21/20 1722)  . sodium chloride 250 mL (11/27/20 0902)  . sodium chloride    .  ceFAZolin (ANCEF) IV 2 g (12/06/20 1036)   PRN Meds: sodium chloride, acetaminophen, heparin, LORazepam, ondansetron (ZOFRAN) IV, oxyCODONE, Resource ThickenUp Clear, senna, silver nitrate applicators, sodium chloride, sodium chloride flush   Vital Signs    Vitals:   12/06/20 0900 12/06/20 0930 12/06/20 1000 12/06/20 1030  BP: (!) 147/71 (!) 149/73 (!) 142/66 129/62  Pulse:      Resp:   18 (!) 22  Temp:      TempSrc:      SpO2:      Weight:      Height:       No intake or output data in the 24 hours ending 12/06/20 1129 Last 3 Weights 12/06/2020 12/06/2020 12/05/2020  Weight  (lbs) (No Data) 177 lb 172 lb 13.5 oz  Weight (kg) (No Data) 80.287 kg 78.4 kg      Telemetry    SR-->ST rates 100s - Personally Reviewed  ECG    SR 90bpm, anterolateral infarct Q1, aVL and anterior leads - Personally Reviewed  Physical Exam   GEN: No acute distress.   Neck: No JVD Cardiac: RRR, + systolic murmur, no rubs, or gallops.  Respiratory: Clear to auscultation bilaterally. GI: Soft, nontender, non-distended  MS: No edema; No deformity. Neuro:  Nonfocal  Psych: Normal affect   Labs    High Sensitivity Troponin:   Recent Labs  Lab 11/26/20 0818 11/26/20 0935  TROPONINIHS 287* 750*      Chemistry Recent Labs  Lab 12/03/20 0057 12/04/20 0146 12/06/20 0428  NA 131* 131* 132*  K 4.6 4.8 4.2  CL 91* 90* 93*  CO2 24 24 24   GLUCOSE 97 99 98  BUN 100* 135* 101*  CREATININE 7.13* 8.99* 7.94*  CALCIUM 10.3 10.0 10.3  ALBUMIN 1.9* 1.8* 1.8*  GFRNONAA 8* 6* 7*  ANIONGAP 16* 17* 15     Hematology Recent Labs  Lab 12/04/20 0146 12/05/20  1757 12/06/20 0428  WBC 16.5* QUESTIONABLE RESULTS, RECOMMEND RECOLLECT TO VERIFY 11.0*  RBC 2.49* QUESTIONABLE RESULTS, RECOMMEND RECOLLECT TO VERIFY 2.85*  HGB 7.0* QUESTIONABLE RESULTS, RECOMMEND RECOLLECT TO VERIFY 8.0*  HCT 22.9* QUESTIONABLE RESULTS, RECOMMEND RECOLLECT TO VERIFY 26.3*  MCV 92.0 QUESTIONABLE RESULTS, RECOMMEND RECOLLECT TO VERIFY 92.3  MCH 28.1 QUESTIONABLE RESULTS, RECOMMEND RECOLLECT TO VERIFY 28.1  MCHC 30.6 QUESTIONABLE RESULTS, RECOMMEND RECOLLECT TO VERIFY 30.4  RDW 14.0 QUESTIONABLE RESULTS, RECOMMEND RECOLLECT TO VERIFY 13.8  PLT 423* QUESTIONABLE RESULTS, RECOMMEND RECOLLECT TO VERIFY 353    BNPNo results for input(s): BNP, PROBNP in the last 168 hours.   DDimer No results for input(s): DDIMER in the last 168 hours.   Radiology    No results found.  Cardiac Studies    Cath PCI 11/20: Acute anterolateral ST elevation MI with Culprit lesion being the 99% mid LAD lesion (just past  major 1st Diag/D1) along with multivessel CAD including 99% ostial AV groove LCx and 80% calcified napkin ring proximal RCA lesion with extensive calcification throughout the RCA. ? Successful PTCA and DES PCI of the LAD crossing D1 -resolute Onyx DES 2.75 mm x 18 mm postdilated to 3.1 mm.  Mild to moderate reduced EF with anterior anterolateral hypokinesis, EF roughly 40 to 45%.  Initial evaluation suggested normal EDP, but on recheck post PCI LVEDP of 30 mmHg, severely elevated consistent with ACUTE DIASTOLIC HEART FAILURE  Significantly dilated aortic root requiring AL-1 guide catheter for both Left and Right Coronary Angiography   TTE 10/31/2020: EF 35 to 40%. Moderate decreased function. Moderate concentric LVH GR 3 DD. Severe HK of the mid apical inferolateral and inferior wall with moderate HK of the apical septal and anterior wall. PA pressure estimated 43 mmHg. Severe LA dilation. Moderate RA dilation.Moderate to severe MR.Aortic root dilated at 41 mm. RV PSP 15 mmHg.   LFemV Central line placed by [facility] 11/21 This is an purposes  TEE 11/01/2020:EF 35-40% moderate decreased function. Severe LA dilation. No LAA thrombus. Moderate RA dilation. 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 Mild dilation of ascending aorta measuring 40 mm. Moderate grade 3 plaque..   11/01/2020: Right IJ dialysis catheter placed, right radial arterial line placed.  11/03/2020: CorPac placed inIR   Echo 11/26/2020: EF 45 to 50% with mildly decreased function. Moderate concentric LVH and indeterminate filling. Mild to moderate MR due to restricted PMV L with mitral annular calcification III B.   Patient Profile     59 y.o. male with PMH of ESRD on HD, HTN, HLD, GERD, OSA who presented as a STEMI. Taken to the cath lab with stenting to the LAD.  Hospital course as follows below.  Significant Hospital Events   11/20 STEMI-stent LAD.  11/21 Afib with RVR, s.p cardioversion  11/23 S/P DCCV of recurrent Afib. Intubated. Septic PNA and cardiogenic shock.  11/24 Brief brady arrest ADD: Start CRRT.  11/30 Stool output from mouth>Ileus.  12/2 EGD: Esophagitis. No bleeding and probable ischemic duodenitis  12/3 extubated.  12/4 CRRT d/c'd. Delirious  12/5 iHD  12/9 R CT inserted for pleural effusion.  12/11 Given a course of TPA and lytics via chest tube 12/12 Tx to [facility],  12/13 Pt turned over and pulled chest tube and there is leak, stat CXR ordered.  12/14 Chest tube replaced. Readmit to ICU with worsening pleural effusion and hypotension. Levo started.  12/19 Txf  back to ICU for resp failure, failed bipap and was intubated.  12/22 Extubated.Has tenuous respiratory status requiring BiPAP 12/23 Multidisciplinary goals of care meeting. Remains full code. 12/25 Remains on Precedex due to anxiety, delirium.   Assessment & Plan    1.  Anterior MI: Status post culprit lesion PCI-->5 weeks ago on November 21 with PCI to 99% LAD stenosis.  Patient has significant concomitant CAD with 80% calcified napkin ring proximal RCA stenosis at 99% AV groove circumflex stenosis.  Initial plan was to perform staged PCI to the RCA with medical therapy to the circumflex. He is without recurrent chest pain and plan to defer until he is medically stable to proceed.    2.  PAF: Maintaining sinus rhythm on amiodarone 200 mg twice a day and was transitioned to oral metoprolol via cortrak.  -- Currently on metoprolol to 37.5 mg   3.  Stage renal disease on hemodialysis: on Monday Wednesday and Friday. Currently in HD this morning  4.  MSSA bacteremia with lung seeding: On Ancef 2 g IV with dialysis plan for prolonged 6 week course through 12/19/2020.  5. Hypotension: he was on midodrine 20 mg every 8 hours, but this has been stopped. BP currently  stable.   6.  GI bleed: due to erosive esophagitis and duodenal ulceration; no recurrence  7. Anemia: H/H 8.0/26.3. Goal Hgb >8 with CAD.  For questions or updates, please contact [place] Please consult [email or web address] for contact info under        Signed, Reino Bellis, NP  12/06/2020, 11:29 AM     Patient seen and examined. Agree with assessment and plan. He continues to gain strength on a daily basis. He underwent dialysis today. Currently blood pressure is stable at 130/80. Pulse was in the 90s and he is maintaining sinus rhythm. At present, plan is to defer RCA intervention to allow for improved stability resolution of previous erosive esophagitis and duodenal ulceration. Sitter additional titration of metoprolol to 50 mg twice a day depending upon blood pressure and heart rate. At this point, we will sign off but are available subsequent evaluation as needed.   Troy Sine, MD, [facility] 12/06/2020 4:25 PM

## 2020-12-06 NOTE — Progress Notes (Signed)
.    Progress Note    Travis [name]  JKD:[id number] DOB: [birth year]  DOA: 10/28/2020 PCP: London Pepper, MD    Brief Narrative:     Medical records reviewed and are as summarized below:  Travis [name] is an 59 y.o. male with history of ESRD, HTN, CAD-who presented with anterior STEMI-treated with DES to LAD-hospital course prolonged and complicated by atrial fibrillation with RVR requiring cardioversion x2,, septic shock due to MSSA bacteremia, brief PEA arrest requiring CPR, acute hypoxic respiratory failure requiring intubation, upper GI bleed with acute blood loss anemia due to severe esophagitis requiring PRBC transfusion, severe metabolic encephalopathy/ICU delirium, bilateral lung abscesses with empyema requiring chest tube insertion--stabilized in the ICU-subsequently transferred to the [facility] service on 12/12, his chest tube was dislodged on the floor, he started developing increasing right-sided pleural effusion/empyema, patient was transferred back to ICU and chest tube was placed.  He was transferred back to [facility] on 12/18.  Worsening respiratory failure due to volume overload and patient was re-intubated on 12/19.     Assessment/Plan:   Principal Problem:   Acute ST elevation myocardial infarction (STEMI) of anterolateral wall (HCC) Active Problems:   ESRD (end stage renal disease) (HCC)   UGIB (upper gastrointestinal bleed)   Essential hypertension   Anemia due to GI blood loss   Hyperlipidemia with target LDL less than 70   3 V- CAD w/ ACS/STEMI: Culprit = 99% mLAD @ D1 (DES PCI jailing D1); 99% ost-AVGLCx, 80% calcified napkin ring prox RCA.    Presence of drug coated stent in LAD coronary artery: Resolute Onyx DES 2.75 mm x 18 mm (3.1 mm) at major D1 & SP1.   Acute combined systolic and diastolic heart failure (HCC)   Atrial fibrillation with RVR (HCC)   Bacteremia   Septic shock due to Staphylococcus aureus [facility])   Cardiac arrest ([place])   Pneumonia  of right lower lobe due to methicillin susceptible Staphylococcus aureus (MSSA) (HCC)   Severe mitral regurgitation   Acute myocardial infarction (HCC)   Acute ST elevation myocardial infarction (STEMI) involving left anterior descending (LAD) coronary artery (HCC)   Pressure injury of skin   Protein-calorie malnutrition, severe   Pleural effusion   Empyema (HCC)   Chest tube in place   PAF (paroxysmal atrial fibrillation) ([place])   Acute STEMI with LAD stent -on Brilinta. -cardiology consult appreciated: Will defer plan for RCA PCI for now until more stable particularly with recent GI bleed and need for anticoagulation for intervention.  - Associated ischemic cardiomyopathy.    paroxysmal atrial fibrillation  - anticoagulation  held given GI bleeding during this hospitalization -amiodarone  MSSA bacteremia - Planning for cefazolin for 6 weeks total  Right parapneumonic effusion, loculated but improved drainage after DNase/TPA -chest tube removed   End-stage renal disease.   -Dialysis as per nephrology  -STRICT FLUID RESTRICTION  Pressure wound- sacral area, not present on admission Pressure Injury 11/14/20 Buttocks Mid;Right;Left Unstageable - Full thickness tissue loss in which the base of the injury is covered by slough (yellow, tan, gray, green or brown) and/or eschar (tan, brown or black) in the wound bed. (Active)  11/14/20 1128  Location: Buttocks  Location Orientation: Mid;Right;Left  Staging: Unstageable - Full thickness tissue loss in which the base of the injury is covered by slough (yellow, tan, gray, green or brown) and/or eschar (tan, brown or black) in the wound bed.  Wound Description (Comments):   Present on Admission:  Anemia of CD and ABLA (GI bleed and bleeding from wound) -s/p 1 unit PRBC on 12/18  ICU delirium Seroquel QHS -weaned off precedex   Family Communication/Anticipated D/C date and plan/Code Status   DVT prophylaxis: heparin Code  Status: Full Code.  Disposition Plan: Status is: Inpatient LM for wife Remains inpatient appropriate because:Ongoing diagnostic testing needed not appropriate for outpatient work up   Dispo: The patient is from: Home              Anticipated d/c is to: tbd              Anticipated d/c date is: > 3 days              Patient currently is not medically stable to d/c.         Medical Consultants:    ID  PCCM  Renal  Cards  IR  WOC     Subjective:   Wants to get out of bed  Objective:    Vitals:   12/06/20 1000 12/06/20 1030 12/06/20 1116 12/06/20 1200  BP: (!) 142/66 129/62 122/76 123/70  Pulse:    94  Resp: 18 (!) 22 (!) 23 17  Temp:   98.9 F (37.2 C) 98.3 F (36.8 C)  TempSrc:   Oral Axillary  SpO2:   91% 95%  Weight:   79.8 kg   Height:        Intake/Output Summary (Last 24 hours) at 12/06/2020 1302 Last data filed at 12/06/2020 1116 Gross per 24 hour  Intake --  Output 3000 ml  Net -3000 ml   Filed Weights   12/05/20 0344 12/06/20 0641 12/06/20 1116  Weight: 78.4 kg 80.3 kg 79.8 kg    Exam:  General: Appearance:    Chronically ill male in no acute distress     Lungs:     Clear to auscultation bilaterally, respirations unlabored  Heart:    Normal heart rate.   MS:   All extremities are intact.   Neurologic:   Awake, alert, oriented x 3. No apparent focal neurological           defect.  Large wound on sacral area      Data Reviewed:   I have personally reviewed following labs and imaging studies:  Labs: Labs show the following:   Basic Metabolic Panel: Recent Labs  Lab 12/01/20 0732 12/02/20 0335 12/03/20 0057 12/04/20 0146 12/06/20 0428  NA 132* 133* 131* 131* 132*  K 4.5 4.3 4.6 4.8 4.2  CL 92* 92* 91* 90* 93*  CO2 25 27 24 24 24   GLUCOSE 138* 91 97 99 98  BUN 82* 66* 100* 135* 101*  CREATININE 7.01* 5.44* 7.13* 8.99* 7.94*  CALCIUM 9.7 10.1 10.3 10.0 10.3  MG  --  1.8 2.0 2.2  --   PHOS 4.1 3.7 4.7* 5.4* 5.6*    GFR Estimated Creatinine Clearance: 10.8 mL/min (A) (by C-G formula based on SCr of 7.94 mg/dL (H)). Liver Function Tests: Recent Labs  Lab 12/01/20 0732 12/02/20 0335 12/03/20 0057 12/04/20 0146 12/06/20 0428  ALBUMIN 1.6* 1.9* 1.9* 1.8* 1.8*   No results for input(s): LIPASE, AMYLASE in the last 168 hours. No results for input(s): AMMONIA in the last 168 hours. Coagulation profile No results for input(s): INR, PROTIME in the last 168 hours.  CBC: Recent Labs  Lab 12/02/20 0335 12/03/20 0057 12/04/20 0146 12/05/20 1757 12/06/20 0428  WBC 17.5* 19.2* 16.5* QUESTIONABLE RESULTS, RECOMMEND RECOLLECT TO VERIFY  11.0*  HGB 7.3* 7.1* 7.0* QUESTIONABLE RESULTS, RECOMMEND RECOLLECT TO VERIFY 8.0*  HCT 22.7* 23.4* 22.9* QUESTIONABLE RESULTS, RECOMMEND RECOLLECT TO VERIFY 26.3*  MCV 90.8 93.6 92.0 QUESTIONABLE RESULTS, RECOMMEND RECOLLECT TO VERIFY 92.3  PLT 423* 450* 423* QUESTIONABLE RESULTS, RECOMMEND RECOLLECT TO VERIFY 353   Cardiac Enzymes: No results for input(s): CKTOTAL, CKMB, CKMBINDEX, TROPONINI in the last 168 hours. BNP (last 3 results) No results for input(s): PROBNP in the last 8760 hours. CBG: Recent Labs  Lab 12/05/20 1512 12/05/20 1931 12/05/20 2347 12/06/20 0417 12/06/20 0640  GLUCAP 116* 101* 101* 99 82   D-Dimer: No results for input(s): DDIMER in the last 72 hours. Hgb A1c: No results for input(s): HGBA1C in the last 72 hours. Lipid Profile: No results for input(s): CHOL, HDL, LDLCALC, TRIG, CHOLHDL, LDLDIRECT in the last 72 hours. Thyroid function studies: No results for input(s): TSH, T4TOTAL, T3FREE, THYROIDAB in the last 72 hours.  Invalid input(s): FREET3 Anemia work up: No results for input(s): VITAMINB12, FOLATE, FERRITIN, TIBC, IRON, RETICCTPCT in the last 72 hours. Sepsis Labs: Recent Labs  Lab 12/03/20 0057 12/04/20 0146 12/05/20 1757 12/06/20 0428  WBC 19.2* 16.5* QUESTIONABLE RESULTS, RECOMMEND RECOLLECT TO VERIFY 11.0*     Microbiology Recent Results (from the past 240 hour(s))  Culture, respiratory (non-expectorated)     Status: None   Collection Time: 11/26/20  5:22 PM   Specimen: Tracheal Aspirate; Respiratory  Result Value Ref Range Status   Specimen Description TRACHEAL ASPIRATE  Final   Special Requests NONE  Final   Gram Stain NO WBC SEEN NO ORGANISMS SEEN   Final   Culture   Final    RARE Normal respiratory flora-no Staph aureus or Pseudomonas seen Performed at [facility], [address], [place],  [address]    Report Status 11/29/2020 FINAL  Final    Procedures and diagnostic studies:  No results found.  Medications:   . (feeding supplement) PROSource Plus  30 mL Oral TID PC & HS  . amiodarone  200 mg Oral BID  . atorvastatin  80 mg Oral q1800  . Chlorhexidine Gluconate Cloth  6 each Topical Q0600  . collagenase   Topical Daily  . darbepoetin (ARANESP) injection - DIALYSIS  60 mcg Intravenous Q Mon-HD  . docusate sodium  100 mg Oral BID  . doxercalciferol  5 mcg Intravenous Q M,W,F  . feeding supplement (NEPRO CARB STEADY)  600 mL Per Tube Q24H  . heparin injection (subcutaneous)  5,000 Units Subcutaneous Q8H  . lactobacillus acidophilus  1 tablet Oral TID  . mouth rinse  15 mL Mouth Rinse BID  . melatonin  5 mg Oral QHS  . metoprolol tartrate  37.5 mg Oral BID  . multivitamin  1 tablet Oral QHS  . nutrition supplement (JUVEN)  1 packet Oral BID BM  . pantoprazole  40 mg Oral BID  . polyethylene glycol  17 g Oral Daily  . QUEtiapine  100 mg Oral QHS  . sevelamer carbonate  1.6 g Oral TID WC  . sodium chloride flush  3 mL Intravenous Q12H  . ticagrelor  90 mg Oral BID   Continuous Infusions: . sodium chloride 10 mL/hr (11/21/20 1722)  . sodium chloride 250 mL (11/27/20 0902)  . sodium chloride    .  ceFAZolin (ANCEF) IV 2 g (12/06/20 1036)     LOS: 65 days   Geradine Girt  [facility]   How to contact the [facility] Attending or Consulting  provider [place] or covering provider during after hours [place], for this patient?  1. Check the care team in [facility] and look for a) attending/consulting [facility] provider listed and b) the [facility] team listed 2. Log into [email or web address] and use [place] universal password to access. If you do not have the password, please contact the hospital operator. 3. Locate the [facility] provider you are looking for under [facility] and page to a number that you can be directly reached. 4. If you still have difficulty reaching the provider, please page the [facility] (Director on Call) for the Hospitalists listed on amion for assistance.  12/06/2020, 1:02 PM

## 2020-12-06 NOTE — Progress Notes (Signed)
Patient ID: Travis [name], male   DOB: 1961/01/03, 59 y.o.   MRN: [id number]  [facility] Progress Note   Assessment/ Plan:   1. Acute hypoxic respiratory failure: CT showed diffuse perihilar GG changes, most likely pulm edema/ vol overload. Better, extubated on 12/22.  2. ESRD: on HD MWF schedule. SP CRRT x 2 rounds. Heparin not being used due to significant ABLA/GI bleed recently.  HD today.  3. Volume: recurrent issues in hospital w/ fluid overload and resp distress. Fluid restriction very important.  Doing well at 98- 79kg now. UF 3 L per HD ave.  4. Anemia: Multifactorial with acute illness and now likely complicated by malnutrition/inflammatory complex with sacral decubitus.  Continue ESA darbe 60 ug q wk on Mondays, w/ as needed iron.  Recently worsened GI bleed secondary to duodenal ulcers and esophagitis.  Currently on sucralfate w/ stop date of 12/31 to limit aluminum toxicity. 5. CKD-MBD: Calcium and phosphorus level currently at goal, continue strategies to optimize nutrition while on sevelamer 3 times daily AC. 6. Nutrition: Ongoing tube feeds at this time with protein supplementation to help improve healing of large sacral decubitus. 7. History of acute STEMI: admit diagnosis, sp PCI to LAD on 10/2019.  On Brilinta due to multifocal/stable disease. 8. Sacral decubitus (unstageable): Wound care with hydrotherapy and pressure alleviating devices. Maintain optimization of nutritional status to help wound healing. May not tolerate HD in a chair.  9. Bilat MSSA cavitary PNA w/ empyema: earlier this admit, rx'd w/ abx and chest tubes.   Kelly Splinter, MD 12/06/2020, 12:20 PM    Subjective:   Seen in ICU, in bed, no c/o today. 3L off w/ HD yest   Objective:   BP 123/70 (BP Location: Right Leg)   Pulse 94   Temp 98.3 F (36.8 C) (Axillary)   Resp 17   Ht 5\' 11"  (1.803 m)   Wt 79.8 kg   SpO2 95%   BMI 24.55 kg/m   Physical Exam: Gen: Awake, alert CVS: RRR no  RG Resp: lungs clear bilat no rales Abd: Soft, nondistended, bowel sounds normal Ext: 1-2+ pedal edema bilat  LUA AVF+bruit  OP HD: AF MWF   4h  450/800  81.5kg  2/2.25 bath  RUE AVF  Heparin on hold for inpt GI bleeding issues (was on 5000u bolus+ 1000 midrun)   - hect 5 ug tiw   -  mircera 50 ug q 4, last 11/15 (due 11/29)   Labs: BMET Recent Labs  Lab 11/30/20 0040 12/01/20 0732 12/02/20 0335 12/03/20 0057 12/04/20 0146 12/06/20 0428  NA 133* 132* 133* 131* 131* 132*  K 3.7 4.5 4.3 4.6 4.8 4.2  CL 96* 92* 92* 91* 90* 93*  CO2 25 25 27 24 24 24   GLUCOSE 136* 138* 91 97 99 98  BUN 42* 82* 66* 100* 135* 101*  CREATININE 4.92* 7.01* 5.44* 7.13* 8.99* 7.94*  CALCIUM 9.5 9.7 10.1 10.3 10.0 10.3  PHOS 3.4 4.1 3.7 4.7* 5.4* 5.6*   CBC Recent Labs  Lab 12/03/20 0057 12/04/20 0146 12/05/20 1757 12/06/20 0428  WBC 19.2* 16.5* QUESTIONABLE RESULTS, RECOMMEND RECOLLECT TO VERIFY 11.0*  HGB 7.1* 7.0* QUESTIONABLE RESULTS, RECOMMEND RECOLLECT TO VERIFY 8.0*  HCT 23.4* 22.9* QUESTIONABLE RESULTS, RECOMMEND RECOLLECT TO VERIFY 26.3*  MCV 93.6 92.0 QUESTIONABLE RESULTS, RECOMMEND RECOLLECT TO VERIFY 92.3  PLT 450* 423* QUESTIONABLE RESULTS, RECOMMEND RECOLLECT TO VERIFY 353      Medications:    . (feeding supplement) PROSource Plus  30 mL Oral TID PC & HS  . amiodarone  200 mg Oral BID  . atorvastatin  80 mg Oral q1800  . Chlorhexidine Gluconate Cloth  6 each Topical Q0600  . collagenase   Topical Daily  . darbepoetin (ARANESP) injection - DIALYSIS  60 mcg Intravenous Q Mon-HD  . docusate sodium  100 mg Oral BID  . doxercalciferol  5 mcg Intravenous Q M,W,F  . feeding supplement (NEPRO CARB STEADY)  600 mL Per Tube Q24H  . heparin injection (subcutaneous)  5,000 Units Subcutaneous Q8H  . lactobacillus acidophilus  1 tablet Oral TID  . mouth rinse  15 mL Mouth Rinse BID  . melatonin  5 mg Oral QHS  . metoprolol tartrate  37.5 mg Oral BID  . multivitamin  1 tablet Oral  QHS  . nutrition supplement (JUVEN)  1 packet Oral BID BM  . pantoprazole  40 mg Oral BID  . polyethylene glycol  17 g Oral Daily  . QUEtiapine  100 mg Oral QHS  . sevelamer carbonate  1.6 g Oral TID WC  . sodium chloride flush  3 mL Intravenous Q12H  . ticagrelor  90 mg Oral BID

## 2020-12-06 NOTE — Progress Notes (Signed)
Inpatient Rehabilitation-Admissions Coordinator   Noted good participation with therapy on 12/27 with pt able to stand and march in place. Will need to see if he is able to tolerate any OOB activity (sitting up in a recliner) as his buttocks wound appears extensive. Concerns about being able to tolerate sitting for OP HD after hospital admission. If pt is unable to tolerate sitting up, anticipate he may need LTAC, if he qualifies. Will discuss with TOC team and continue to follow.   Raechel Ache, OTR/L  Rehab Admissions Coordinator  [phone] 12/06/2020 11:55 AM

## 2020-12-07 DIAGNOSIS — I2109 ST elevation (STEMI) myocardial infarction involving other coronary artery of anterior wall: Secondary | ICD-10-CM | POA: Diagnosis not present

## 2020-12-07 LAB — GLUCOSE, CAPILLARY
Glucose-Capillary: 102 mg/dL — ABNORMAL HIGH (ref 70–99)
Glucose-Capillary: 109 mg/dL — ABNORMAL HIGH (ref 70–99)
Glucose-Capillary: 112 mg/dL — ABNORMAL HIGH (ref 70–99)
Glucose-Capillary: 88 mg/dL (ref 70–99)
Glucose-Capillary: 96 mg/dL (ref 70–99)

## 2020-12-07 MED ORDER — HYDROCERIN EX CREA
TOPICAL_CREAM | CUTANEOUS | Status: DC | PRN
  Filled 2020-12-07: qty 113

## 2020-12-07 NOTE — Progress Notes (Signed)
Calorie Count Note  Calorie count ordered and started yesterday 12/29 at lunch meal. Please see results below.  Diet: dysphagia 3, thin liquids (1000 ml fluid restriction) Supplements: - ProSource Plus QID - Juven BID  Lunch: 763 kcal, 65 grams of protein Dinner: 545 kcal, 10 grams of protein Breakfast: 1089 kcal, 12 grams of protein Supplements: 250 kcal, 49 grams of protein  Total 24-hour intake: 2647 kcal (>100% of estimated needs)  136 grams of protein (100% of minimum estimated needs)  Nutrition Diagnosis: Severe Malnutrition related to chronic illness as evidenced by severe muscle depletion,severe fat depletion.  Goal: Patient will meet greater than or equal to 90% of their needs  Intervention: - d/c calorie count - d/c nocturnal tube feeding orders - d/c Cortrak tube (discussed with MD and RN) - Continue ProSource Plus QID - Continue Juven BID - Continue renal MVI daily   Gustavus Bryant, MS, RD, LDN Inpatient Clinical Dietitian Please see AMiON for contact information.

## 2020-12-07 NOTE — Plan of Care (Signed)
  Problem: Education: Goal: Understanding of CV disease, CV risk reduction, and recovery process will improve Outcome: Progressing   Problem: Activity: Goal: Ability to return to baseline activity level will improve Outcome: Progressing   Problem: Cardiovascular: Goal: Ability to achieve and maintain adequate cardiovascular perfusion will improve Outcome: Progressing   Problem: Health Behavior/Discharge Planning: Goal: Ability to safely manage health-related needs after discharge will improve Outcome: Progressing   Problem: Education: Goal: Knowledge of General Education information will improve Description: Including pain rating scale, medication(s)/side effects and non-pharmacologic comfort measures Outcome: Progressing   Problem: Health Behavior/Discharge Planning: Goal: Ability to manage health-related needs will improve Outcome: Progressing   Problem: Clinical Measurements: Goal: Ability to maintain clinical measurements within normal limits will improve Outcome: Progressing Goal: Will remain free from infection Outcome: Progressing Goal: Diagnostic test results will improve Outcome: Progressing Goal: Respiratory complications will improve Outcome: Progressing Goal: Cardiovascular complication will be avoided Outcome: Progressing   Problem: Activity: Goal: Risk for activity intolerance will decrease Outcome: Progressing   Problem: Nutrition: Goal: Adequate nutrition will be maintained Outcome: Progressing   Problem: Coping: Goal: Level of anxiety will decrease Outcome: Progressing   Problem: Elimination: Goal: Will not experience complications related to bowel motility Outcome: Progressing   Problem: Pain Managment: Goal: General experience of comfort will improve Outcome: Progressing   Problem: Safety: Goal: Ability to remain free from injury will improve Outcome: Progressing   Problem: Skin Integrity: Goal: Risk for impaired skin integrity will  decrease Outcome: Progressing

## 2020-12-07 NOTE — Progress Notes (Signed)
Patient ID: Travis [name], male   DOB: 08-27-1961, 59 y.o.   MRN: [id number]   [facility] Progress Note   Assessment/ Plan:   1. Acute hypoxic respiratory failure: CT showed diffuse perihilar GG changes, most likely pulm edema/ vol overload. Was on vent 12/19- 12/22 this time, O2 weaning down HFNC 10 > 9 > 7 > 4 L [place] now.  2. ESRD: on HD MWF schedule. SP CRRT x 2 rounds. Heparin not being used due to significant ABLA/GI bleed recently.  HD Friday, full 4 hrs w/ ^BUN/ Cr. Try 2-3 L UF, on exam is probably approaching dry wt.  3. Volume: recurrent issues in hospital w/ fluid overload and resp distress. Fluid restriction very important.  Down to 77kg now, most leg edema resolved. On 4 L [place]. Cont to lower vol if BP's tolerate.  4. Anemia: Multifactorial with acute illness and now likely complicated by malnutrition/inflammatory complex with sacral decubitus.  Continue ESA darbe 60 ug q wk on Mondays, w/ as needed iron.  Recently worsened GI bleed secondary to duodenal ulcers and esophagitis.  Currently on sucralfate w/ stop date of 12/31 to limit aluminum toxicity. 5. CKD-MBD: Calcium and phosphorus level currently at goal, continue strategies to optimize nutrition while on sevelamer 3 times daily AC. 6. Nutrition: Ongoing tube feeds at this time with protein supplementation to help improve healing of large sacral decubitus. 7. History of acute STEMI: admit diagnosis, sp PCI to LAD on 10/2019.  On Brilinta due to multifocal/stable disease. 8. Sacral decubitus (unstageable): Wound care with hydrotherapy and pressure alleviating devices. Maintain optimization of nutritional status to help wound healing. May not tolerate HD in a chair.  9. Bilat MSSA cavitary PNA w/ empyema: earlier this admit, rx'd w/ abx and chest tubes.   Kelly Splinter, MD 12/07/2020, 12:19 PM    Subjective:   Seen in ICU, out of bed, has been ambulating.    Objective:   BP 102/77 (BP Location: Right Arm)   Pulse  92   Temp 98.7 F (37.1 C) (Oral)   Resp 18   Ht 5\' 11"  (1.803 m)   Wt 77.1 kg   SpO2 96%   BMI 23.71 kg/m   Physical Exam: Gen: Awake, alert CVS: RRR no RG Resp: lungs clear bilat no rales Abd: Soft, nondistended, bowel sounds normal Ext: trace pedal edema LUA AVF+bruit  OP HD: AF MWF   4h  450/800  81.5kg  2/2.25 bath  RUE AVF  Heparin -on hold for inpt GI bleeding issues (was on 5000u bolus+ 1000 midrun)   - hect 5 ug tiw   -  mircera 50 ug q 4, last 11/15 (due 11/29)   Labs: BMET Recent Labs  Lab 12/01/20 0732 12/02/20 0335 12/03/20 0057 12/04/20 0146 12/06/20 0428  NA 132* 133* 131* 131* 132*  K 4.5 4.3 4.6 4.8 4.2  CL 92* 92* 91* 90* 93*  CO2 25 27 24 24 24   GLUCOSE 138* 91 97 99 98  BUN 82* 66* 100* 135* 101*  CREATININE 7.01* 5.44* 7.13* 8.99* 7.94*  CALCIUM 9.7 10.1 10.3 10.0 10.3  PHOS 4.1 3.7 4.7* 5.4* 5.6*   CBC Recent Labs  Lab 12/03/20 0057 12/04/20 0146 12/05/20 1757 12/06/20 0428  WBC 19.2* 16.5* QUESTIONABLE RESULTS, RECOMMEND RECOLLECT TO VERIFY 11.0*  HGB 7.1* 7.0* QUESTIONABLE RESULTS, RECOMMEND RECOLLECT TO VERIFY 8.0*  HCT 23.4* 22.9* QUESTIONABLE RESULTS, RECOMMEND RECOLLECT TO VERIFY 26.3*  MCV 93.6 92.0 QUESTIONABLE RESULTS, RECOMMEND RECOLLECT TO VERIFY 92.3  PLT 450* 423* QUESTIONABLE RESULTS, RECOMMEND RECOLLECT TO VERIFY 353      Medications:    . (feeding supplement) PROSource Plus  30 mL Oral TID PC & HS  . amiodarone  200 mg Oral BID  . atorvastatin  80 mg Oral q1800  . Chlorhexidine Gluconate Cloth  6 each Topical Q0600  . collagenase   Topical Daily  . darbepoetin (ARANESP) injection - DIALYSIS  60 mcg Intravenous Q Mon-HD  . docusate sodium  100 mg Oral BID  . doxercalciferol  5 mcg Intravenous Q M,W,F  . heparin injection (subcutaneous)  5,000 Units Subcutaneous Q8H  . lactobacillus acidophilus  1 tablet Oral TID  . mouth rinse  15 mL Mouth Rinse BID  . melatonin  5 mg Oral QHS  . metoprolol tartrate  37.5 mg  Oral BID  . multivitamin  1 tablet Oral QHS  . nutrition supplement (JUVEN)  1 packet Oral BID BM  . pantoprazole  40 mg Oral BID  . polyethylene glycol  17 g Oral Daily  . QUEtiapine  100 mg Oral QHS  . sevelamer carbonate  1.6 g Oral TID WC  . sodium chloride flush  3 mL Intravenous Q12H  . ticagrelor  90 mg Oral BID

## 2020-12-07 NOTE — Progress Notes (Signed)
SLP Cancellation Note  Patient Details Name: DAVINCI GLOTFELTY MRN: [id number] DOB: Dec 18, 1960   Cancelled treatment:       Reason Eval/Treat Not Completed: Other (comment). Pt alert and cooperative. Has been enjoying his meals. Did not observe with PO  But checked in with pt and RN and tolerance has been good. Pt able to make appropriate meal texture choices independently. Will upgrade diet and sign off.    Antonela Freiman, Katherene Ponto 12/07/2020, 1:31 PM

## 2020-12-07 NOTE — Progress Notes (Signed)
Physical Therapy Treatment Patient Details Name: Travis [name] MRN: [id number] DOB: January 11, 1961 Today's Date: 12/07/2020    History of Present Illness Pt is 59 y.o. male with past medical history of end-stage renal disease on hemodialysis, essential hypertension, dilated ascending aorta and hyperlipidemia who presented on November 20 with anterior ST elevation myocardial infarction.  VDRF 11/23-12/3.  Stent placed 11/20.  Afib with RVR with cardioversion 11/21. Suspected cholecystolithiasis 11/29. Respiratory failure and reintubated 11/26/20.    PT Comments    Pt supine in bed on arrival this session.  Pt motivated to ambulate.  Wife present at bedside.  Pt progressing gt distance on decreased O2 but remains to require 3L to stay above 90 %.  Updated frequency and DME needs.  Plan to trial rollator next session.     Follow Up Recommendations  CIR     Equipment Recommendations  3in1 (PT);Other (comment) (rollator.)    Recommendations for Other Services       Precautions / Restrictions Precautions Precautions: Fall Precaution Comments: O2 Restrictions Weight Bearing Restrictions: No    Mobility  Bed Mobility Overal bed mobility: Needs Assistance Bed Mobility: Rolling;Sidelying to Sit;Sit to Sidelying Rolling: Min assist Sidelying to sit: Min assist     Sit to sidelying: +2 for safety/equipment;Min assist General bed mobility comments: Min assistance to lift B LEs back to bed against gravity.  Able to lift first LE and assistance to lift additional LE.  Transfers Overall transfer level: Needs assistance Equipment used: Rolling walker (2 wheeled) Transfers: Sit to/from Stand Sit to Stand: Min assist         General transfer comment: Cues for hand placement to and from seated surface.  Ambulation/Gait Ambulation/Gait assistance: Min assist;+2 safety/equipment (for close chair follow.) Gait Distance (Feet): 80 Feet (+ 40 ft) Assistive device: Rolling walker (2  wheeled) Gait Pattern/deviations: Decreased stride length;Step-through pattern;Trunk flexed Gait velocity: decreased   General Gait Details: Cues for upper trunk control and pacing.  Seated rest break this session between trials. Pt SPO2 decreased on 2L to 85% required 3L to maintain 90% or >.   Stairs             Wheelchair Mobility    Modified Rankin (Stroke Patients Only)       Balance Overall balance assessment: Needs assistance Sitting-balance support: No upper extremity supported;Feet supported Sitting balance-Leahy Scale: Fair       Standing balance-Leahy Scale: Poor                              Cognition Arousal/Alertness: Awake/alert Behavior During Therapy: WFL for tasks assessed/performed Overall Cognitive Status: Within Functional Limits for tasks assessed                                        Exercises      General Comments        Pertinent Vitals/Pain Pain Assessment: No/denies pain Pain Location: bottom    Home Living                      Prior Function            PT Goals (current goals can now be found in the care plan section) Acute Rehab PT Goals Patient Stated Goal: Move better and get stronger Potential to Achieve Goals: Good Progress towards PT  goals: Progressing toward goals    Frequency    Min 4X/week      PT Plan Current plan remains appropriate    Co-evaluation              AM-PAC PT "6 Clicks" Mobility   Outcome Measure  Help needed turning from your back to your side while in a flat bed without using bedrails?: None Help needed moving from lying on your back to sitting on the side of a flat bed without using bedrails?: A Little Help needed moving to and from a bed to a chair (including a wheelchair)?: A Little Help needed standing up from a chair using your arms (e.g., wheelchair or bedside chair)?: A Little Help needed to walk in hospital room?: A Little Help  needed climbing 3-5 steps with a railing? : A Lot 6 Click Score: 18    End of Session Equipment Utilized During Treatment: Oxygen;Gait belt (2-3L O2) Activity Tolerance: Patient tolerated treatment well Patient left: with call bell/phone within reach;in bed;with bed alarm set Nurse Communication: Mobility status PT Visit Diagnosis: Muscle weakness (generalized) (M62.81)     Time: 5621-3086 PT Time Calculation (min) (ACUTE ONLY): 28 min  Charges:  $Gait Training: 8-22 mins $Therapeutic Activity: 8-22 mins                     Erasmo Leventhal , PTA Acute Rehabilitation Services Pager [phone] Office [phone]     Nicolena Schurman Eli Hose 12/07/2020, 4:19 PM

## 2020-12-07 NOTE — Progress Notes (Signed)
.    Progress Note    Travis [name]  ZJQ:[id number] DOB: 1961-01-23  DOA: 10/28/2020 PCP: London Pepper, MD    Brief Narrative:     Medical records reviewed and are as summarized below:  Travis [name] is an 59 y.o. male with history of ESRD, HTN, CAD-who presented with anterior STEMI-treated with DES to LAD-hospital course prolonged and complicated by atrial fibrillation with RVR requiring cardioversion x2,, septic shock due to MSSA bacteremia, brief PEA arrest requiring CPR, acute hypoxic respiratory failure requiring intubation, upper GI bleed with acute blood loss anemia due to severe esophagitis requiring PRBC transfusion, severe metabolic encephalopathy/ICU delirium, bilateral lung abscesses with empyema requiring chest tube insertion--stabilized in the ICU-subsequently transferred to the [facility] on 12/12, his chest tube was dislodged on the floor, he started developing increasing right-sided pleural effusion/empyema, patient was transferred back to ICU and chest tube was placed.  He was transferred back to [facility] on 12/18.  Worsening respiratory failure due to volume overload and patient was re-intubated on 12/19.     Assessment/Plan:   Principal Problem:   Acute ST elevation myocardial infarction (STEMI) of anterolateral wall (HCC) Active Problems:   ESRD (end stage renal disease) (HCC)   UGIB (upper gastrointestinal bleed)   Essential hypertension   Anemia due to GI blood loss   Hyperlipidemia with target LDL less than 70   3 V- CAD w/ ACS/STEMI: Culprit = 99% mLAD @ D1 (DES PCI jailing D1); 99% ost-AVGLCx, 80% calcified napkin ring prox RCA.    Presence of drug coated stent in LAD coronary artery: Resolute Onyx DES 2.75 mm x 18 mm (3.1 mm) at major D1 & SP1.   Acute combined systolic and diastolic heart failure (HCC)   Atrial fibrillation with RVR (HCC)   Bacteremia   Septic shock due to Staphylococcus aureus [facility])   Cardiac arrest ([place])   Pneumonia  of right lower lobe due to methicillin susceptible Staphylococcus aureus (MSSA) (HCC)   Severe mitral regurgitation   Acute myocardial infarction (HCC)   Acute ST elevation myocardial infarction (STEMI) involving left anterior descending (LAD) coronary artery (HCC)   Pressure injury of skin   Protein-calorie malnutrition, severe   Pleural effusion   Empyema (HCC)   Chest tube in place   PAF (paroxysmal atrial fibrillation) ([place])   Acute STEMI with LAD stent -on Brilinta. -cardiology consult appreciated: Will defer plan for RCA PCI for now until more stable particularly with recent GI bleed and need for anticoagulation for intervention.  - Associated ischemic cardiomyopathy.    paroxysmal atrial fibrillation  - anticoagulation  held given GI bleeding during this hospitalization -amiodarone  MSSA bacteremia - Planning for cefazolin for 6 weeks total  Right parapneumonic effusion, loculated but improved drainage after DNase/TPA -chest tube removed   End-stage renal disease.   -Dialysis as per nephrology  -STRICT FLUID RESTRICTION  Pressure wound- sacral area, not present on admission Pressure Injury 11/14/20 Buttocks Mid;Right;Left Unstageable - Full thickness tissue loss in which the base of the injury is covered by slough (yellow, tan, gray, green or brown) and/or eschar (tan, brown or black) in the wound bed. (Active)  11/14/20 1128  Location: Buttocks  Location Orientation: Mid;Right;Left  Staging: Unstageable - Full thickness tissue loss in which the base of the injury is covered by slough (yellow, tan, gray, green or brown) and/or eschar (tan, brown or black) in the wound bed.  Wound Description (Comments):   Present on Admission:  Anemia of CD and ABLA (GI bleed and bleeding from wound) -s/p 1 unit PRBC on 12/18  ICU delirium Seroquel QHS -weaned off precedex   Overall seems much improved.  Plan for CIR once able to sit in chair for 1 hour  Family  Communication/Anticipated D/C date and plan/Code Status   DVT prophylaxis: heparin Code Status: Full Code.  Disposition Plan: Status is: Inpatient Spoke with wife at bedside Remains inpatient appropriate because:Ongoing diagnostic testing needed not appropriate for outpatient work up   Dispo: The patient is from: Home              Anticipated d/c is to: tbd              Anticipated d/c date is: 1-2 days              Patient currently is not medically stable to d/c.CIR?         Medical Consultants:    ID  PCCM  Renal  Cards  IR  WOC     Subjective:   Says he is eating well and would like feeding tube out  Objective:    Vitals:   12/07/20 0340 12/07/20 0535 12/07/20 0801 12/07/20 1139  BP: 109/66  108/66 102/77  Pulse: 98  (!) 101 92  Resp: (!) 22  (!) 25 18  Temp: 99.5 F (37.5 C)  98.6 F (37 C) 98.7 F (37.1 C)  TempSrc: Oral  Oral Oral  SpO2: 94%  95% 96%  Weight:  77.1 kg    Height:        Intake/Output Summary (Last 24 hours) at 12/07/2020 1318 Last data filed at 12/06/2020 1900 Gross per 24 hour  Intake 300 ml  Output --  Net 300 ml   Filed Weights   12/06/20 0641 12/06/20 1116 12/07/20 0535  Weight: 80.3 kg 79.8 kg 77.1 kg    Exam:  General: Appearance:    Chronically ill male in no acute distress     Lungs:     Good effort, few crackles at bases respirations unlabored  Heart:    Normal heart rate.   MS:   All extremities are intact. Skin dry on legs  Neurologic:   Awake, alert, oriented x 3. No apparent focal neurological           defect.       Data Reviewed:   I have personally reviewed following labs and imaging studies:  Labs: Labs show the following:   Basic Metabolic Panel: Recent Labs  Lab 12/01/20 0732 12/02/20 0335 12/03/20 0057 12/04/20 0146 12/06/20 0428  NA 132* 133* 131* 131* 132*  K 4.5 4.3 4.6 4.8 4.2  CL 92* 92* 91* 90* 93*  CO2 25 27 24 24 24   GLUCOSE 138* 91 97 99 98  BUN 82* 66* 100* 135*  101*  CREATININE 7.01* 5.44* 7.13* 8.99* 7.94*  CALCIUM 9.7 10.1 10.3 10.0 10.3  MG  --  1.8 2.0 2.2  --   PHOS 4.1 3.7 4.7* 5.4* 5.6*   GFR Estimated Creatinine Clearance: 10.8 mL/min (A) (by C-G formula based on SCr of 7.94 mg/dL (H)). Liver Function Tests: Recent Labs  Lab 12/01/20 0732 12/02/20 0335 12/03/20 0057 12/04/20 0146 12/06/20 0428  ALBUMIN 1.6* 1.9* 1.9* 1.8* 1.8*   No results for input(s): LIPASE, AMYLASE in the last 168 hours. No results for input(s): AMMONIA in the last 168 hours. Coagulation profile No results for input(s): INR, PROTIME in the last 168 hours.  CBC: Recent Labs  Lab 12/02/20 0335 12/03/20 0057 12/04/20 0146 12/05/20 1757 12/06/20 0428  WBC 17.5* 19.2* 16.5* QUESTIONABLE RESULTS, RECOMMEND RECOLLECT TO VERIFY 11.0*  HGB 7.3* 7.1* 7.0* QUESTIONABLE RESULTS, RECOMMEND RECOLLECT TO VERIFY 8.0*  HCT 22.7* 23.4* 22.9* QUESTIONABLE RESULTS, RECOMMEND RECOLLECT TO VERIFY 26.3*  MCV 90.8 93.6 92.0 QUESTIONABLE RESULTS, RECOMMEND RECOLLECT TO VERIFY 92.3  PLT 423* 450* 423* QUESTIONABLE RESULTS, RECOMMEND RECOLLECT TO VERIFY 353   Cardiac Enzymes: No results for input(s): CKTOTAL, CKMB, CKMBINDEX, TROPONINI in the last 168 hours. BNP (last 3 results) No results for input(s): PROBNP in the last 8760 hours. CBG: Recent Labs  Lab 12/06/20 2004 12/06/20 2328 12/07/20 0335 12/07/20 0800 12/07/20 1137  GLUCAP 96 94 96 88 112*   D-Dimer: No results for input(s): DDIMER in the last 72 hours. Hgb A1c: No results for input(s): HGBA1C in the last 72 hours. Lipid Profile: No results for input(s): CHOL, HDL, LDLCALC, TRIG, CHOLHDL, LDLDIRECT in the last 72 hours. Thyroid function studies: No results for input(s): TSH, T4TOTAL, T3FREE, THYROIDAB in the last 72 hours.  Invalid input(s): FREET3 Anemia work up: No results for input(s): VITAMINB12, FOLATE, FERRITIN, TIBC, IRON, RETICCTPCT in the last 72 hours. Sepsis Labs: Recent Labs  Lab  12/03/20 0057 12/04/20 0146 12/05/20 1757 12/06/20 0428  WBC 19.2* 16.5* QUESTIONABLE RESULTS, RECOMMEND RECOLLECT TO VERIFY 11.0*    Microbiology No results found for this or any previous visit (from the past 240 hour(s)).  Procedures and diagnostic studies:  No results found.  Medications:    (feeding supplement) PROSource Plus  30 mL Oral TID PC & HS   amiodarone  200 mg Oral BID   atorvastatin  80 mg Oral q1800   Chlorhexidine Gluconate Cloth  6 each Topical Q0600   collagenase   Topical Daily   darbepoetin (ARANESP) injection - DIALYSIS  60 mcg Intravenous Q Mon-HD   docusate sodium  100 mg Oral BID   doxercalciferol  5 mcg Intravenous Q M,W,F   heparin injection (subcutaneous)  5,000 Units Subcutaneous Q8H   lactobacillus acidophilus  1 tablet Oral TID   mouth rinse  15 mL Mouth Rinse BID   melatonin  5 mg Oral QHS   metoprolol tartrate  37.5 mg Oral BID   multivitamin  1 tablet Oral QHS   nutrition supplement (JUVEN)  1 packet Oral BID BM   pantoprazole  40 mg Oral BID   polyethylene glycol  17 g Oral Daily   QUEtiapine  100 mg Oral QHS   sevelamer carbonate  1.6 g Oral TID WC   sodium chloride flush  3 mL Intravenous Q12H   ticagrelor  90 mg Oral BID   Continuous Infusions:  sodium chloride 10 mL/hr (11/21/20 1722)   sodium chloride 250 mL (11/27/20 0902)   sodium chloride      ceFAZolin (ANCEF) IV 2 g (12/06/20 1036)     LOS: 40 days   Geradine Girt  [facility]   How to contact the [facility] Attending or Consulting provider [place] or covering provider during after hours [place], for this patient?  1. Check the care team in [facility] and look for a) attending/consulting [facility] provider listed and b) the [facility] team listed 2. Log into [email or web address] and use [place]'s universal password to access. If you do not have the password, please contact the hospital operator. 3. Locate the [facility] provider you are looking for under [facility]  and page to a number that  you can be directly reached. 4. If you still have difficulty reaching the provider, please page the [facility] (Director on Call) for the Hospitalists listed on amion for assistance.  12/07/2020, 1:18 PM

## 2020-12-08 ENCOUNTER — Inpatient Hospital Stay (HOSPITAL_COMMUNITY): Payer: Medicare Other

## 2020-12-08 DIAGNOSIS — N186 End stage renal disease: Secondary | ICD-10-CM | POA: Diagnosis not present

## 2020-12-08 DIAGNOSIS — I2109 ST elevation (STEMI) myocardial infarction involving other coronary artery of anterior wall: Secondary | ICD-10-CM | POA: Diagnosis not present

## 2020-12-08 DIAGNOSIS — I5041 Acute combined systolic (congestive) and diastolic (congestive) heart failure: Secondary | ICD-10-CM | POA: Diagnosis not present

## 2020-12-08 LAB — GLUCOSE, CAPILLARY
Glucose-Capillary: 93 mg/dL (ref 70–99)
Glucose-Capillary: 116 mg/dL — ABNORMAL HIGH (ref 70–99)
Glucose-Capillary: 107 mg/dL — ABNORMAL HIGH (ref 70–99)
Glucose-Capillary: 103 mg/dL — ABNORMAL HIGH (ref 70–99)

## 2020-12-08 LAB — RENAL FUNCTION PANEL
Albumin: 1.9 g/dL — ABNORMAL LOW (ref 3.5–5.0)
Anion gap: 14 (ref 5–15)
BUN: 89 mg/dL — ABNORMAL HIGH (ref 6–20)
CO2: 26 mmol/L (ref 22–32)
Calcium: 10.5 mg/dL — ABNORMAL HIGH (ref 8.9–10.3)
Chloride: 95 mmol/L — ABNORMAL LOW (ref 98–111)
Creatinine, Ser: 8.32 mg/dL — ABNORMAL HIGH (ref 0.61–1.24)
GFR, Estimated: 7 mL/min — ABNORMAL LOW (ref >60)
Glucose, Bld: 107 mg/dL — ABNORMAL HIGH (ref 70–99)
Phosphorus: 5.2 mg/dL — ABNORMAL HIGH (ref 2.5–4.6)
Potassium: 3.9 mmol/L (ref 3.5–5.1)
Sodium: 135 mmol/L (ref 135–145)

## 2020-12-08 LAB — CBC
HCT: 24.8 % — ABNORMAL LOW (ref 39.0–52.0)
Hemoglobin: 8 g/dL — ABNORMAL LOW (ref 13.0–17.0)
MCH: 29.3 pg (ref 26.0–34.0)
MCHC: 32.3 g/dL (ref 30.0–36.0)
MCV: 90.8 fL (ref 80.0–100.0)
Platelets: 353 10*3/uL (ref 150–400)
RBC: 2.73 MIL/uL — ABNORMAL LOW (ref 4.22–5.81)
RDW: 13.9 % (ref 11.5–15.5)
WBC: 11.7 10*3/uL — ABNORMAL HIGH (ref 4.0–10.5)
nRBC: 0 % (ref 0.0–0.2)

## 2020-12-08 LAB — AMMONIA: Ammonia: 15 umol/L (ref 9–35)

## 2020-12-08 MED ORDER — QUETIAPINE FUMARATE 50 MG PO TABS
75.0000 mg | ORAL_TABLET | Freq: Every day | ORAL | Status: DC
  Administered 2020-12-08 – 2020-12-12 (×5): 75 mg via ORAL
  Filled 2020-12-08 (×2): qty 2
  Filled 2020-12-08: qty 1
  Filled 2020-12-08: qty 2
  Filled 2020-12-08: qty 1
  Filled 2020-12-08: qty 2

## 2020-12-08 MED ORDER — DOXERCALCIFEROL 4 MCG/2ML IV SOLN
INTRAVENOUS | Status: AC
  Filled 2020-12-08: qty 4

## 2020-12-08 NOTE — Progress Notes (Addendum)
PT Cancellation Note  Patient Details Name: Travis [name] MRN: [id number] DOB: 22-Apr-1961   Cancelled Treatment:    Reason Eval/Treat Not Completed: Patient at procedure or test/unavailable (Pt off unit for dialysis, Will f/u per POC.)  Of note:  Pt with delirium and decreased memory which was not an issue yesterday.  He was alert and oriented x 4 yesterday.    Naveen Clardy Eli Hose 12/08/2020, 12:18 PM  Erasmo Leventhal , PTA Acute Rehabilitation Services Pager [phone] Office [phone]

## 2020-12-08 NOTE — Progress Notes (Addendum)
Patient ID: POLO MCMARTIN, male   DOB: Nov 08, 1961, 59 y.o.   MRN: [id number]  [facility] Progress Note   Assessment/ Plan:   1. Acute hypoxic respiratory failure: CT showed diffuse perihilar GG changes, most likely pulm edema/ vol overload. Was on vent 12/19- 12/22 this time, O2 weaning down HFNC 10 > 9 > 7 > 4 L [place] now.  2. AMS: corr Ca around 11.5-12, also has uremic odor. For HD today. Will plan extra HD Sat, see if that helps.  3. ESRD: on HD MWF schedule. SP CRRT x 2 rounds. Heparin not being used due to significant ABLA/GI bleed recently.  HD Friday, full 4 hrs w/ ^BUN/ Cr. Try 2-3 L UF, on exam is probably approaching dry wt.  4. Volume: recurrent issues in hospital w/ fluid overload and resp distress. Fluid restriction very important.  Down to 77kg now, most leg edema resolved. On 4 L [place]. Cont to lower vol if BP's tolerate.  5. Anemia: Multifactorial with acute illness and now likely complicated by malnutrition/inflammatory complex with sacral decubitus.  Continue ESA darbe 60 ug q wk on Mondays, w/ as needed iron.  Recently worsened GI bleed secondary to duodenal ulcers and esophagitis.  Currently on sucralfate w/ stop date of 12/31 to limit aluminum toxicity. 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 7. Nutrition: Ongoing tube feeds at this time with protein supplementation to help improve healing of large sacral decubitus. 8. History of acute STEMI: admit diagnosis, sp PCI to LAD on 10/2019.  On Brilinta due to multifocal/stable disease. 9. Sacral decubitus (unstageable): Wound care with hydrotherapy and pressure alleviating devices. Maintain optimization of nutritional status to help wound healing. May not tolerate HD in a chair.  10. Bilat MSSA cavitary PNA w/ empyema: earlier this admit, rx'd w/ abx and chest tubes.   Kelly Splinter, MD 12/08/2020, 3:59  PM    Subjective:    Seen on HD, is quite confused and yelling to "cut the wire".   Objective:   BP (!) 104/59   Pulse 98   Temp 99.6 F (37.6 C) (Oral)   Resp (!) 31   Ht 5\' 11"  (1.803 m)   Wt 83.6 kg   SpO2 92%   BMI 25.71 kg/m   Physical Exam: Gen: Awake, alert CVS: RRR no RG Resp: lungs clear bilat no rales Abd: Soft, nondistended, bowel sounds normal Ext: trace pedal edema LUA AVF+bruit  OP HD: AF MWF   4h  450/800  81.5kg  2/2.25 bath  RUE AVF  Heparin -on hold for inpt GI bleeding issues (was on 5000u bolus+ 1000 midrun)   - hect 5 ug tiw   -  mircera 50 ug q 4, last 11/15 (due 11/29)   Labs: BMET Recent Labs  Lab 12/02/20 0335 12/03/20 0057 12/04/20 0146 12/06/20 0428 12/08/20 0516  NA 133* 131* 131* 132* 135  K 4.3 4.6 4.8 4.2 3.9  CL 92* 91* 90* 93* 95*  CO2 27 24 24 24 26   GLUCOSE 91 97 99 98 107*  BUN 66* 100* 135* 101* 89*  CREATININE 5.44* 7.13* 8.99* 7.94* 8.32*  CALCIUM 10.1 10.3 10.0 10.3 10.5*  PHOS 3.7 4.7* 5.4* 5.6* 5.2*   CBC Recent Labs  Lab 12/04/20 0146 12/05/20 1757 12/06/20 0428 12/08/20 0516  WBC 16.5* QUESTIONABLE RESULTS, RECOMMEND RECOLLECT TO VERIFY 11.0* 11.7*  HGB 7.0* QUESTIONABLE RESULTS, RECOMMEND RECOLLECT TO VERIFY  8.0* 8.0*  HCT 22.9* QUESTIONABLE RESULTS, RECOMMEND RECOLLECT TO VERIFY 26.3* 24.8*  MCV 92.0 QUESTIONABLE RESULTS, RECOMMEND RECOLLECT TO VERIFY 92.3 90.8  PLT 423* QUESTIONABLE RESULTS, RECOMMEND RECOLLECT TO VERIFY 353 353      Medications:    . (feeding supplement) PROSource Plus  30 mL Oral TID PC & HS  . amiodarone  200 mg Oral BID  . atorvastatin  80 mg Oral q1800  . Chlorhexidine Gluconate Cloth  6 each Topical Q0600  . collagenase   Topical Daily  . darbepoetin (ARANESP) injection - DIALYSIS  60 mcg Intravenous Q Mon-HD  . docusate sodium  100 mg Oral BID  . doxercalciferol  5 mcg Intravenous Q M,W,F  . heparin injection (subcutaneous)  5,000 Units Subcutaneous Q8H  . lactobacillus  acidophilus  1 tablet Oral TID  . mouth rinse  15 mL Mouth Rinse BID  . melatonin  5 mg Oral QHS  . metoprolol tartrate  37.5 mg Oral BID  . multivitamin  1 tablet Oral QHS  . nutrition supplement (JUVEN)  1 packet Oral BID BM  . pantoprazole  40 mg Oral BID  . polyethylene glycol  17 g Oral Daily  . QUEtiapine  75 mg Oral QHS  . sevelamer carbonate  1.6 g Oral TID WC  . sodium chloride flush  3 mL Intravenous Q12H  . ticagrelor  90 mg Oral BID

## 2020-12-08 NOTE — Procedures (Signed)
   I was present at this dialysis session, have reviewed the session itself and made  appropriate changes Kelly Splinter MD [place] pager [phone]   12/08/2020, 4:18 PM

## 2020-12-08 NOTE — Progress Notes (Signed)
.    Progress Note    Travis [name]  IRJ:[id number] DOB: [birth year]  DOA: 10/28/2020 PCP: London Pepper, MD    Brief Narrative:     Medical records reviewed and are as summarized below:  Travis [name] is an 59 y.o. male with history of ESRD, HTN, CAD-who presented with anterior STEMI-treated with DES to LAD-hospital course prolonged and complicated by atrial fibrillation with RVR requiring cardioversion x2,, septic shock due to MSSA bacteremia, brief PEA arrest requiring CPR, acute hypoxic respiratory failure requiring intubation, upper GI bleed with acute blood loss anemia due to severe esophagitis requiring PRBC transfusion, severe metabolic encephalopathy/ICU delirium, bilateral lung abscesses with empyema requiring chest tube insertion--stabilized in the ICU-subsequently transferred to the [facility] on 12/12, his chest tube was dislodged on the floor, he started developing increasing right-sided pleural effusion/empyema, patient was transferred back to ICU and chest tube was placed.  He was transferred back to [facility] on 12/18.  Worsening respiratory failure due to volume overload and patient was re-intubated on 12/19.   Continues to have intermittent AMS and issues with delirium.     Assessment/Plan:   Principal Problem:   Acute ST elevation myocardial infarction (STEMI) of anterolateral wall (HCC) Active Problems:   ESRD (end stage renal disease) (HCC)   UGIB (upper gastrointestinal bleed)   Essential hypertension   Anemia due to GI blood loss   Hyperlipidemia with target LDL less than 70   3 V- CAD w/ ACS/STEMI: Culprit = 99% mLAD @ D1 (DES PCI jailing D1); 99% ost-AVGLCx, 80% calcified napkin ring prox RCA.    Presence of drug coated stent in LAD coronary artery: Resolute Onyx DES 2.75 mm x 18 mm (3.1 mm) at major D1 & SP1.   Acute combined systolic and diastolic heart failure (HCC)   Atrial fibrillation with RVR (HCC)   Bacteremia   Septic shock due to  Staphylococcus aureus [facility])   Cardiac arrest ([place])   Pneumonia of right lower lobe due to methicillin susceptible Staphylococcus aureus (MSSA) (HCC)   Severe mitral regurgitation   Acute myocardial infarction ([place])   Acute ST elevation myocardial infarction (STEMI) involving left anterior descending (LAD) coronary artery (HCC)   Pressure injury of skin   Protein-calorie malnutrition, severe   Pleural effusion   Empyema (HCC)   Chest tube in place   PAF (paroxysmal atrial fibrillation) (HCC)   Delirium/AMS- worsened around 4 AM on 12/31 -unclear etiology: concern from uremia, withdrawal of precedex, infection (check U/a, does not appear to have aspirated, wound appears relatively clean) -check ammonia -delirium precautions -U/A ordered -blood cultures for fever -plan for another HD session in the AM -may need further imaging of brain (not on anticoagulation and has a fib) -no new meds -on seroquel  Acute STEMI with LAD stent -on Brilinta. -cardiology consult appreciated: Will defer plan for RCA PCI for now until more stable particularly with recent GI bleed and need for anticoagulation for intervention.  - Associated ischemic cardiomyopathy.    paroxysmal atrial fibrillation  - anticoagulation  held given GI bleeding during this hospitalization -amiodarone  MSSA bacteremia - Planning for cefazolin for 6 weeks total  Right parapneumonic effusion, loculated but improved drainage after DNase/TPA -chest tube removed   End-stage renal disease.   -Dialysis as per nephrology  -STRICT FLUID RESTRICTION  Pressure wound- sacral area, not present on admission Pressure Injury 11/14/20 Buttocks Mid;Right;Left Unstageable - Full thickness tissue loss in which the base of the injury is covered  by slough (yellow, tan, gray, green or brown) and/or eschar (tan, brown or black) in the wound bed. (Active)  11/14/20 1128  Location: Buttocks  Location Orientation: Mid;Right;Left  Staging:  Unstageable - Full thickness tissue loss in which the base of the injury is covered by slough (yellow, tan, gray, green or brown) and/or eschar (tan, brown or black) in the wound bed.  Wound Description (Comments):   Present on Admission:     Anemia of CD and ABLA (GI bleed and bleeding from wound) -s/p 1 unit PRBC on 12/18   Hypercalcemia (corrected calcium 12.2) -unclear -check I ca -not on supplementation    Family Communication/Anticipated D/C date and plan/Code Status   DVT prophylaxis: heparin Code Status: Full Code.  Disposition Plan: Status is: Inpatient Spoke with wife at bedside in PM and daughter this AM Remains inpatient appropriate because:Ongoing diagnostic testing needed not appropriate for outpatient work up   Dispo: The patient is from: Home              Anticipated d/c is to: tbd              Anticipated d/c date is: 1-2 days              Patient currently is not medically stable to d/c.CIR?         Medical Consultants:    ID  PCCM  Renal  Cards  IR  WOC     Subjective:   Daughter reports onset of confusion this AM at 4-- has continued through out the day  Objective:    Vitals:   12/08/20 1540 12/08/20 1555 12/08/20 1610 12/08/20 1625  BP: 116/78 119/71 123/86 114/69  Pulse:    96  Resp:    18  Temp:    98.2 F (36.8 C)  TempSrc:    Oral  SpO2:    96%  Weight:    80.5 kg  Height:        Intake/Output Summary (Last 24 hours) at 12/08/2020 1716 Last data filed at 12/08/2020 1625 Gross per 24 hour  Intake 250 ml  Output 3000 ml  Net -2750 ml   Filed Weights   12/08/20 0503 12/08/20 1221 12/08/20 1625  Weight: 77.7 kg 83.6 kg 80.5 kg    Exam:  General: Appearance:    Chronically ill appearing male- confused and irritable, agitated at times     Lungs:     On [place], respirations unlabored  Heart:    Normal heart rate.   MS:   All extremities are intact. No LE edema  Neurologic:   Awake, agitated, difficult to  re-direct       Data Reviewed:   I have personally reviewed following labs and imaging studies:  Labs: Labs show the following:   Basic Metabolic Panel: Recent Labs  Lab 12/02/20 0335 12/03/20 0057 12/04/20 0146 12/06/20 0428 12/08/20 0516  NA 133* 131* 131* 132* 135  K 4.3 4.6 4.8 4.2 3.9  CL 92* 91* 90* 93* 95*  CO2 27 24 24 24 26   GLUCOSE 91 97 99 98 107*  BUN 66* 100* 135* 101* 89*  CREATININE 5.44* 7.13* 8.99* 7.94* 8.32*  CALCIUM 10.1 10.3 10.0 10.3 10.5*  MG 1.8 2.0 2.2  --   --   PHOS 3.7 4.7* 5.4* 5.6* 5.2*   GFR Estimated Creatinine Clearance: 10.3 mL/min (A) (by C-G formula based on SCr of 8.32 mg/dL (H)). Liver Function Tests: Recent Labs  Lab 12/02/20 0335 12/03/20  6387 12/04/20 0146 12/06/20 0428 12/08/20 0516  ALBUMIN 1.9* 1.9* 1.8* 1.8* 1.9*   No results for input(s): LIPASE, AMYLASE in the last 168 hours. No results for input(s): AMMONIA in the last 168 hours. Coagulation profile No results for input(s): INR, PROTIME in the last 168 hours.  CBC: Recent Labs  Lab 12/03/20 0057 12/04/20 0146 12/05/20 1757 12/06/20 0428 12/08/20 0516  WBC 19.2* 16.5* QUESTIONABLE RESULTS, RECOMMEND RECOLLECT TO VERIFY 11.0* 11.7*  HGB 7.1* 7.0* QUESTIONABLE RESULTS, RECOMMEND RECOLLECT TO VERIFY 8.0* 8.0*  HCT 23.4* 22.9* QUESTIONABLE RESULTS, RECOMMEND RECOLLECT TO VERIFY 26.3* 24.8*  MCV 93.6 92.0 QUESTIONABLE RESULTS, RECOMMEND RECOLLECT TO VERIFY 92.3 90.8  PLT 450* 423* QUESTIONABLE RESULTS, RECOMMEND RECOLLECT TO VERIFY 353 353   Cardiac Enzymes: No results for input(s): CKTOTAL, CKMB, CKMBINDEX, TROPONINI in the last 168 hours. BNP (last 3 results) No results for input(s): PROBNP in the last 8760 hours. CBG: Recent Labs  Lab 12/07/20 2038 12/07/20 2341 12/08/20 0432 12/08/20 0842 12/08/20 1139  GLUCAP 109* 102* 93 116* 107*   D-Dimer: No results for input(s): DDIMER in the last 72 hours. Hgb A1c: No results for input(s): HGBA1C in  the last 72 hours. Lipid Profile: No results for input(s): CHOL, HDL, LDLCALC, TRIG, CHOLHDL, LDLDIRECT in the last 72 hours. Thyroid function studies: No results for input(s): TSH, T4TOTAL, T3FREE, THYROIDAB in the last 72 hours.  Invalid input(s): FREET3 Anemia work up: No results for input(s): VITAMINB12, FOLATE, FERRITIN, TIBC, IRON, RETICCTPCT in the last 72 hours. Sepsis Labs: Recent Labs  Lab 12/04/20 0146 12/05/20 1757 12/06/20 0428 12/08/20 0516  WBC 16.5* QUESTIONABLE RESULTS, RECOMMEND RECOLLECT TO VERIFY 11.0* 11.7*    Microbiology No results found for this or any previous visit (from the past 240 hour(s)).  Procedures and diagnostic studies:  DG CHEST PORT 1 VIEW  Result Date: 12/08/2020 CLINICAL DATA:  Confused, weakness, acute ST-elevation myocardial infarction of anterolateral wall. Evaluate bilateral infiltrates or edema. Pleural effusion. EXAM: PORTABLE CHEST 1 VIEW COMPARISON:  Chest x-ray dated 12/02/2020. FINDINGS: Improved aeration of both lungs. Probable small bilateral pleural effusions. No pneumothorax is seen. Stable cardiomegaly. Enteric tube has been removed. IMPRESSION: 1. Improved aeration of both lungs compared to yesterday's chest x-ray. Probable small bilateral pleural effusions. 2. Stable cardiomegaly. Electronically Signed   By: Franki Cabot M.D.   On: 12/08/2020 11:47    Medications:   . (feeding supplement) PROSource Plus  30 mL Oral TID PC & HS  . amiodarone  200 mg Oral BID  . atorvastatin  80 mg Oral q1800  . Chlorhexidine Gluconate Cloth  6 each Topical Q0600  . collagenase   Topical Daily  . darbepoetin (ARANESP) injection - DIALYSIS  60 mcg Intravenous Q Mon-HD  . docusate sodium  100 mg Oral BID  . heparin injection (subcutaneous)  5,000 Units Subcutaneous Q8H  . lactobacillus acidophilus  1 tablet Oral TID  . mouth rinse  15 mL Mouth Rinse BID  . melatonin  5 mg Oral QHS  . metoprolol tartrate  37.5 mg Oral BID  . multivitamin   1 tablet Oral QHS  . nutrition supplement (JUVEN)  1 packet Oral BID BM  . pantoprazole  40 mg Oral BID  . polyethylene glycol  17 g Oral Daily  . QUEtiapine  75 mg Oral QHS  . sevelamer carbonate  1.6 g Oral TID WC  . sodium chloride flush  3 mL Intravenous Q12H  . ticagrelor  90 mg Oral BID   Continuous  Infusions: . sodium chloride 10 mL/hr (11/21/20 1722)  . sodium chloride 250 mL (11/27/20 0902)  . sodium chloride    .  ceFAZolin (ANCEF) IV 2 g (12/08/20 1552)     LOS: 41 days   Geradine Girt  [facility]   How to contact the [facility] Attending or Consulting provider [place] or covering provider during after hours [place], for this patient?  1. Check the care team in [facility] and look for a) attending/consulting [facility] provider listed and b) the [facility] team listed 2. Log into [email or web address] and use [place]'s universal password to access. If you do not have the password, please contact the hospital operator. 3. Locate the [facility] provider you are looking for under [facility] and page to a number that you can be directly reached. 4. If you still have difficulty reaching the provider, please page the [facility] (Director on Call) for the Hospitalists listed on amion for assistance.  12/08/2020, 5:16 PM

## 2020-12-09 DIAGNOSIS — I2109 ST elevation (STEMI) myocardial infarction involving other coronary artery of anterior wall: Secondary | ICD-10-CM | POA: Diagnosis not present

## 2020-12-09 LAB — CBC
HCT: 27.2 % — ABNORMAL LOW (ref 39.0–52.0)
Hemoglobin: 8.3 g/dL — ABNORMAL LOW (ref 13.0–17.0)
MCH: 27.9 pg (ref 26.0–34.0)
MCHC: 30.5 g/dL (ref 30.0–36.0)
MCV: 91.3 fL (ref 80.0–100.0)
Platelets: 387 10*3/uL (ref 150–400)
RBC: 2.98 MIL/uL — ABNORMAL LOW (ref 4.22–5.81)
RDW: 13.8 % (ref 11.5–15.5)
WBC: 14.8 10*3/uL — ABNORMAL HIGH (ref 4.0–10.5)
nRBC: 0 % (ref 0.0–0.2)

## 2020-12-09 LAB — COMPREHENSIVE METABOLIC PANEL
ALT: 5 U/L (ref 0–44)
AST: 14 U/L — ABNORMAL LOW (ref 15–41)
Albumin: 2 g/dL — ABNORMAL LOW (ref 3.5–5.0)
Alkaline Phosphatase: 106 U/L (ref 38–126)
Anion gap: 14 (ref 5–15)
BUN: 38 mg/dL — ABNORMAL HIGH (ref 6–20)
CO2: 29 mmol/L (ref 22–32)
Calcium: 10.3 mg/dL (ref 8.9–10.3)
Chloride: 94 mmol/L — ABNORMAL LOW (ref 98–111)
Creatinine, Ser: 5.3 mg/dL — ABNORMAL HIGH (ref 0.61–1.24)
GFR, Estimated: 12 mL/min — ABNORMAL LOW (ref >60)
Glucose, Bld: 111 mg/dL — ABNORMAL HIGH (ref 70–99)
Potassium: 3.6 mmol/L (ref 3.5–5.1)
Sodium: 137 mmol/L (ref 135–145)
Total Bilirubin: 0.6 mg/dL (ref 0.3–1.2)
Total Protein: 6.7 g/dL (ref 6.5–8.1)

## 2020-12-09 LAB — GLUCOSE, CAPILLARY
Glucose-Capillary: 102 mg/dL — ABNORMAL HIGH (ref 70–99)
Glucose-Capillary: 109 mg/dL — ABNORMAL HIGH (ref 70–99)
Glucose-Capillary: 123 mg/dL — ABNORMAL HIGH (ref 70–99)
Glucose-Capillary: 90 mg/dL (ref 70–99)
Glucose-Capillary: 91 mg/dL (ref 70–99)
Glucose-Capillary: 92 mg/dL (ref 70–99)

## 2020-12-09 NOTE — Progress Notes (Incomplete)
Pt sat up in chair 1.5 hours after dialysis.

## 2020-12-09 NOTE — Progress Notes (Signed)
Patient ID: Travis [name], male   DOB: 1961-03-18, 60 y.o.   MRN: [id number]  [facility] Progress Note   Assessment/ Plan:   1. Acute hypoxic respiratory failure: CT showed diffuse perihilar GG changes, most likely pulm edema/ vol overload. Was on vent 12/19- 12/22 this time, O2 weaning down HFNC 10 > 9 > 7 > 4 L [place] now.  2. AMS: 12/31 w/ uremic odor, ^BUN/Cr. Bake to baseline after HD yest, suspect uremia. Extra HD today.  3. ESRD: on HD MWF schedule. SP CRRT course x 2. Heparin not being used due to significant ABLA/GI bleed recently.  Will need full HD time in hospital 4hr. 4. Volume: recurrent issue in hospital w/ fluid overload and resp distress. Resolved. Fluid restriction in place. UF as BP tolerates 5. Anemia: Multifactorial with acute illness and now likely complicated by malnutrition/inflammatory complex with sacral decubitus.  Continue ESA darbe 60 ug q wk on Mondays, w/ as needed iron.  Recently worsened GI bleed secondary to duodenal ulcers and esophagitis.   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 7. Nutrition: Ongoing tube feeds at this time with protein supplementation to help improve healing of large sacral decubitus. 8. History of acute STEMI: admit diagnosis, sp PCI to LAD on 10/2019.  On Brilinta due to multifocal/stable disease. 9. Sacral decubitus (unstageable): Wound care with hydrotherapy and pressure alleviating devices. Maintain optimization of nutritional status to help wound healing. May not tolerate HD in a chair.  10. Bilat MSSA cavitary PNA w/ empyema: rx'd w/ abx and chest tubes.   Kelly Splinter, MD 12/09/2020, 12:11 PM    Subjective:    Seen on HD, is quite confused and yelling to "cut the wire".   Objective:   BP (!) 86/53   Pulse 96   Temp 100 F (37.8 C) (Oral)   Resp (!) 21   Ht 5\' 11"  (1.803 m)   Wt 81.2 kg   SpO2 93%   BMI 24.97  kg/m   Physical Exam: Gen: Awake, alert CVS: RRR no RG Resp: lungs clear bilat no rales Abd: Soft, nondistended, bowel sounds normal Ext: trace pedal edema LUA AVF+bruit  OP HD: AF MWF   4h  450/800  81.5kg  2/2.25 bath  RUE AVF  Heparin -on hold for inpt GI bleeding issues (was on 5000u bolus+ 1000 midrun)   - hect 5 ug tiw   -  mircera 50 ug q 4, last 11/15 (due 11/29)   Labs: BMET Recent Labs  Lab 12/03/20 0057 12/04/20 0146 12/06/20 0428 12/08/20 0516 12/09/20 0103  NA 131* 131* 132* 135 137  K 4.6 4.8 4.2 3.9 3.6  CL 91* 90* 93* 95* 94*  CO2 24 24 24 26 29   GLUCOSE 97 99 98 107* 111*  BUN 100* 135* 101* 89* 38*  CREATININE 7.13* 8.99* 7.94* 8.32* 5.30*  CALCIUM 10.3 10.0 10.3 10.5* 10.3  PHOS 4.7* 5.4* 5.6* 5.2*  --    CBC Recent Labs  Lab 12/05/20 1757 12/06/20 0428 12/08/20 0516 12/09/20 0103  WBC QUESTIONABLE RESULTS, RECOMMEND RECOLLECT TO VERIFY 11.0* 11.7* 14.8*  HGB QUESTIONABLE RESULTS, RECOMMEND RECOLLECT TO VERIFY 8.0* 8.0* 8.3*  HCT QUESTIONABLE RESULTS, RECOMMEND RECOLLECT TO VERIFY 26.3* 24.8* 27.2*  MCV QUESTIONABLE RESULTS, RECOMMEND RECOLLECT TO VERIFY 92.3 90.8 91.3  PLT QUESTIONABLE RESULTS, RECOMMEND RECOLLECT TO VERIFY [phone]      Medications:    Marland Kitchen (  feeding supplement) PROSource Plus  30 mL Oral TID PC & HS  . amiodarone  200 mg Oral BID  . atorvastatin  80 mg Oral q1800  . Chlorhexidine Gluconate Cloth  6 each Topical Q0600  . collagenase   Topical Daily  . darbepoetin (ARANESP) injection - DIALYSIS  60 mcg Intravenous Q Mon-HD  . docusate sodium  100 mg Oral BID  . heparin injection (subcutaneous)  5,000 Units Subcutaneous Q8H  . lactobacillus acidophilus  1 tablet Oral TID  . mouth rinse  15 mL Mouth Rinse BID  . melatonin  5 mg Oral QHS  . metoprolol tartrate  37.5 mg Oral BID  . multivitamin  1 tablet Oral QHS  . nutrition supplement (JUVEN)  1 packet Oral BID BM  . pantoprazole  40 mg Oral BID  . polyethylene  glycol  17 g Oral Daily  . QUEtiapine  75 mg Oral QHS  . sevelamer carbonate  1.6 g Oral TID WC  . sodium chloride flush  3 mL Intravenous Q12H  . ticagrelor  90 mg Oral BID

## 2020-12-09 NOTE — Progress Notes (Signed)
.    Progress Note    Travis [name]  TTS:[id number] DOB: 1961-07-06  DOA: 10/28/2020 PCP: Travis Pepper, MD    Brief Narrative:     Medical records reviewed and are as summarized below:  Travis [name] is an 60 y.o. male with history of ESRD, HTN, CAD-who presented with anterior STEMI-treated with DES to LAD-hospital course prolonged and complicated by atrial fibrillation with RVR requiring cardioversion x2,, septic shock due to MSSA bacteremia, brief PEA arrest requiring CPR, acute hypoxic respiratory failure requiring intubation, upper GI bleed with acute blood loss anemia due to severe esophagitis requiring PRBC transfusion, severe metabolic encephalopathy/ICU delirium, bilateral lung abscesses with empyema requiring chest tube insertion--stabilized in the ICU-subsequently transferred to the [facility] on 12/12, his chest tube was dislodged on the floor, he started developing increasing right-sided pleural effusion/empyema, patient was transferred back to ICU and chest tube was placed.  He was transferred back to [facility] on 12/18.  Worsening respiratory failure due to volume overload and patient was re-intubated on 12/19.   Continues to have intermittent AMS and issues with delirium.     Assessment/Plan:   Principal Problem:   Acute ST elevation myocardial infarction (STEMI) of anterolateral wall (HCC) Active Problems:   ESRD (end stage renal disease) (HCC)   UGIB (upper gastrointestinal bleed)   Essential hypertension   Anemia due to GI blood loss   Hyperlipidemia with target LDL less than 70   3 V- CAD w/ ACS/STEMI: Culprit = 99% mLAD @ D1 (DES PCI jailing D1); 99% ost-AVGLCx, 80% calcified napkin ring prox RCA.    Presence of drug coated stent in LAD coronary artery: Resolute Onyx DES 2.75 mm x 18 mm (3.1 mm) at major D1 & SP1.   Acute combined systolic and diastolic heart failure (HCC)   Atrial fibrillation with RVR (HCC)   Bacteremia   Septic shock due to  Staphylococcus aureus [facility])   Cardiac arrest ([place])   Pneumonia of right lower lobe due to methicillin susceptible Staphylococcus aureus (MSSA) (HCC)   Severe mitral regurgitation   Acute myocardial infarction ([name])   Acute ST elevation myocardial infarction (STEMI) involving left anterior descending (LAD) coronary artery (HCC)   Pressure injury of skin   Protein-calorie malnutrition, severe   Pleural effusion   Empyema (HCC)   Chest tube in place   PAF (paroxysmal atrial fibrillation) (HCC)   Delirium/AMS- worsened around 4 AM on 12/31 -unclear etiology: concern from uremia vs infection (check U/a, does not appear to have aspirated, wound appears relatively clean--? Virus)) -ammonia wnl -delirium precautions -U/A ordered but patient does not make urine -blood cultures for fever -does have cough but x ray not suggestive of PNA -plan for another HD session 1/1 -encourage incentive spirometry -may need further imaging of brain (not on anticoagulation and has a fib) -?viral illness vs bacterial -sputum culture as has cough -hold on additional abx-- will touch base with ID if source not found  OSA -CPAP at night  Acute STEMI with LAD stent -on Brilinta. -cardiology consult appreciated: Will defer plan for RCA PCI for now until more stable particularly with recent GI bleed and need for anticoagulation for intervention.  - Associated ischemic cardiomyopathy.    paroxysmal atrial fibrillation  - anticoagulation  held given GI bleeding during this hospitalization -amiodarone  MSSA bacteremia - Planning for cefazolin for 6 weeks total  Right parapneumonic effusion, loculated but improved drainage after DNase/TPA -chest tube removed   End-stage renal disease.   -  Dialysis as per nephrology  -STRICT FLUID RESTRICTION  Pressure wound- sacral area, not present on admission Pressure Injury 11/14/20 Buttocks Mid;Right;Left Unstageable - Full thickness tissue loss in which the base  of the injury is covered by slough (yellow, tan, gray, green or brown) and/or eschar (tan, brown or black) in the wound bed. (Active)  11/14/20 1128  Location: Buttocks  Location Orientation: Mid;Right;Left  Staging: Unstageable - Full thickness tissue loss in which the base of the injury is covered by slough (yellow, tan, gray, green or brown) and/or eschar (tan, brown or black) in the wound bed.  Wound Description (Comments):   Present on Admission:     Anemia of CD and ABLA (GI bleed and bleeding from wound) -s/p 1 unit PRBC on 12/18   Hypercalcemia (corrected calcium 12.2) -monitor -decreased today -not on supplementation    Family Communication/Anticipated D/C date and plan/Code Status   DVT prophylaxis: heparin Code Status: Full Code.  Disposition Plan: Status is: Inpatient Spoke with wife at bedside  Remains inpatient appropriate because:Ongoing diagnostic testing needed not appropriate for outpatient work up   Dispo: The patient is from: Home              Anticipated d/c is to: tbd CIR?              Anticipated d/c date is: 1-2 days              Patient currently is not medically stable to d/c.CIR?         Medical Consultants:    ID  PCCM  Renal  Cards  IR  WOC     Subjective:   Confusion appear resolved  Objective:    Vitals:   12/09/20 0500 12/09/20 0600 12/09/20 0800 12/09/20 1145  BP:  100/69 (!) 102/57 (!) 86/53  Pulse:  88 96   Resp:  16  (!) 21  Temp:   100 F (37.8 C)   TempSrc:   Oral   SpO2:  97% 93%   Weight: 81.2 kg     Height:        Intake/Output Summary (Last 24 hours) at 12/09/2020 1219 Last data filed at 12/08/2020 1800 Gross per 24 hour  Intake 175 ml  Output 3000 ml  Net -2825 ml   Filed Weights   12/08/20 1221 12/08/20 1625 12/09/20 0500  Weight: 83.6 kg 80.5 kg 81.2 kg    Exam:   General: Appearance:    Chronically ill appearing male with muscle wasting     Lungs:    diminished breath sounds,  respirations unlabored  Heart:    Normal heart rate.   MS:   All extremities are intact. Skin dry  Neurologic:   Awake, alert, oriented x 3. Not as altered as yesterday      Data Reviewed:   I have personally reviewed following labs and imaging studies:  Labs: Labs show the following:   Basic Metabolic Panel: Recent Labs  Lab 12/03/20 0057 12/04/20 0146 12/06/20 0428 12/08/20 0516 12/09/20 0103  NA 131* 131* 132* 135 137  K 4.6 4.8 4.2 3.9 3.6  CL 91* 90* 93* 95* 94*  CO2 24 24 24 26 29   GLUCOSE 97 99 98 107* 111*  BUN 100* 135* 101* 89* 38*  CREATININE 7.13* 8.99* 7.94* 8.32* 5.30*  CALCIUM 10.3 10.0 10.3 10.5* 10.3  MG 2.0 2.2  --   --   --   PHOS 4.7* 5.4* 5.6* 5.2*  --  GFR Estimated Creatinine Clearance: 16.2 mL/min (A) (by C-G formula based on SCr of 5.3 mg/dL (H)). Liver Function Tests: Recent Labs  Lab 12/03/20 0057 12/04/20 0146 12/06/20 0428 12/08/20 0516 12/09/20 0103  AST  --   --   --   --  14*  ALT  --   --   --   --  5  ALKPHOS  --   --   --   --  106  BILITOT  --   --   --   --  0.6  PROT  --   --   --   --  6.7  ALBUMIN 1.9* 1.8* 1.8* 1.9* 2.0*   No results for input(s): LIPASE, AMYLASE in the last 168 hours. Recent Labs  Lab 12/08/20 1716  AMMONIA 15   Coagulation profile No results for input(s): INR, PROTIME in the last 168 hours.  CBC: Recent Labs  Lab 12/04/20 0146 12/05/20 1757 12/06/20 0428 12/08/20 0516 12/09/20 0103  WBC 16.5* QUESTIONABLE RESULTS, RECOMMEND RECOLLECT TO VERIFY 11.0* 11.7* 14.8*  HGB 7.0* QUESTIONABLE RESULTS, RECOMMEND RECOLLECT TO VERIFY 8.0* 8.0* 8.3*  HCT 22.9* QUESTIONABLE RESULTS, RECOMMEND RECOLLECT TO VERIFY 26.3* 24.8* 27.2*  MCV 92.0 QUESTIONABLE RESULTS, RECOMMEND RECOLLECT TO VERIFY 92.3 90.8 91.3  PLT 423* QUESTIONABLE RESULTS, RECOMMEND RECOLLECT TO VERIFY 353 353 387   Cardiac Enzymes: No results for input(s): CKTOTAL, CKMB, CKMBINDEX, TROPONINI in the last 168 hours. BNP (last 3  results) No results for input(s): PROBNP in the last 8760 hours. CBG: Recent Labs  Lab 12/08/20 1139 12/08/20 2035 12/09/20 0029 12/09/20 0410 12/09/20 0728  GLUCAP 107* 103* 109* 91 92   D-Dimer: No results for input(s): DDIMER in the last 72 hours. Hgb A1c: No results for input(s): HGBA1C in the last 72 hours. Lipid Profile: No results for input(s): CHOL, HDL, LDLCALC, TRIG, CHOLHDL, LDLDIRECT in the last 72 hours. Thyroid function studies: No results for input(s): TSH, T4TOTAL, T3FREE, THYROIDAB in the last 72 hours.  Invalid input(s): FREET3 Anemia work up: No results for input(s): VITAMINB12, FOLATE, FERRITIN, TIBC, IRON, RETICCTPCT in the last 72 hours. Sepsis Labs: Recent Labs  Lab 12/05/20 1757 12/06/20 0428 12/08/20 0516 12/09/20 0103  WBC QUESTIONABLE RESULTS, RECOMMEND RECOLLECT TO VERIFY 11.0* 11.7* 14.8*    Microbiology No results found for this or any previous visit (from the past 240 hour(s)).  Procedures and diagnostic studies:  DG CHEST PORT 1 VIEW  Result Date: 12/08/2020 CLINICAL DATA:  Confused, weakness, acute ST-elevation myocardial infarction of anterolateral wall. Evaluate bilateral infiltrates or edema. Pleural effusion. EXAM: PORTABLE CHEST 1 VIEW COMPARISON:  Chest x-ray dated 12/02/2020. FINDINGS: Improved aeration of both lungs. Probable small bilateral pleural effusions. No pneumothorax is seen. Stable cardiomegaly. Enteric tube has been removed. IMPRESSION: 1. Improved aeration of both lungs compared to yesterday's chest x-ray. Probable small bilateral pleural effusions. 2. Stable cardiomegaly. Electronically Signed   By: Franki Cabot M.D.   On: 12/08/2020 11:47    Medications:   . (feeding supplement) PROSource Plus  30 mL Oral TID PC & HS  . amiodarone  200 mg Oral BID  . atorvastatin  80 mg Oral q1800  . Chlorhexidine Gluconate Cloth  6 each Topical Q0600  . collagenase   Topical Daily  . darbepoetin (ARANESP) injection - DIALYSIS   60 mcg Intravenous Q Mon-HD  . docusate sodium  100 mg Oral BID  . heparin injection (subcutaneous)  5,000 Units Subcutaneous Q8H  . lactobacillus acidophilus  1 tablet Oral  TID  . mouth rinse  15 mL Mouth Rinse BID  . melatonin  5 mg Oral QHS  . metoprolol tartrate  37.5 mg Oral BID  . multivitamin  1 tablet Oral QHS  . nutrition supplement (JUVEN)  1 packet Oral BID BM  . pantoprazole  40 mg Oral BID  . polyethylene glycol  17 g Oral Daily  . QUEtiapine  75 mg Oral QHS  . sevelamer carbonate  1.6 g Oral TID WC  . sodium chloride flush  3 mL Intravenous Q12H  . ticagrelor  90 mg Oral BID   Continuous Infusions: . sodium chloride 10 mL/hr (11/21/20 1722)  . sodium chloride 250 mL (11/27/20 0902)  . sodium chloride    .  ceFAZolin (ANCEF) IV 2 g (12/08/20 1552)     LOS: 42 days   Geradine Girt  [facility]   How to contact the [facility] Attending or Consulting provider [place] or covering provider during after hours [place], for this patient?  1. Check the care team in [facility] and look for a) attending/consulting [facility] provider listed and b) the [facility] team listed 2. Log into [email or web address] and use [place]'s universal password to access. If you do not have the password, please contact the hospital operator. 3. Locate the [facility] provider you are looking for under [facility] and page to a number that you can be directly reached. 4. If you still have difficulty reaching the provider, please page the [facility] (Director on Call) for the Hospitalists listed on amion for assistance.  12/09/2020, 12:19 PM

## 2020-12-10 DIAGNOSIS — I2109 ST elevation (STEMI) myocardial infarction involving other coronary artery of anterior wall: Secondary | ICD-10-CM | POA: Diagnosis not present

## 2020-12-10 LAB — CBC
HCT: 26.1 % — ABNORMAL LOW (ref 39.0–52.0)
Hemoglobin: 7.8 g/dL — ABNORMAL LOW (ref 13.0–17.0)
MCH: 27.7 pg (ref 26.0–34.0)
MCHC: 29.9 g/dL — ABNORMAL LOW (ref 30.0–36.0)
MCV: 92.6 fL (ref 80.0–100.0)
Platelets: 330 10*3/uL (ref 150–400)
RBC: 2.82 MIL/uL — ABNORMAL LOW (ref 4.22–5.81)
RDW: 13.8 % (ref 11.5–15.5)
WBC: 12.8 10*3/uL — ABNORMAL HIGH (ref 4.0–10.5)
nRBC: 0 % (ref 0.0–0.2)

## 2020-12-10 LAB — CALCIUM, IONIZED: Calcium, Ionized, Serum: 5.1 mg/dL (ref 4.5–5.6)

## 2020-12-10 LAB — GLUCOSE, CAPILLARY
Glucose-Capillary: 98 mg/dL (ref 70–99)
Glucose-Capillary: 87 mg/dL (ref 70–99)
Glucose-Capillary: 91 mg/dL (ref 70–99)

## 2020-12-10 MED ORDER — DARBEPOETIN ALFA 100 MCG/0.5ML IJ SOSY
100.0000 ug | PREFILLED_SYRINGE | INTRAMUSCULAR | Status: DC
  Administered 2020-12-11: 100 ug via INTRAVENOUS

## 2020-12-10 MED ORDER — MIDODRINE HCL 5 MG PO TABS
5.0000 mg | ORAL_TABLET | Freq: Three times a day (TID) | ORAL | Status: DC
  Administered 2020-12-10 – 2020-12-14 (×12): 5 mg via ORAL
  Filled 2020-12-10 (×13): qty 1

## 2020-12-10 NOTE — Progress Notes (Signed)
.    Progress Note    KEVONTE VANECEK  VPX:[id number] DOB: 20-Sep-1961  DOA: 10/28/2020 PCP: London Pepper, MD    Brief Narrative:     Medical records reviewed and are as summarized below:  SAILOR HAUGHN is an 60 y.o. male with history of ESRD, HTN, CAD-who presented with anterior STEMI-treated with DES to LAD-hospital course prolonged and complicated by atrial fibrillation with RVR requiring cardioversion x2,, septic shock due to MSSA bacteremia, brief PEA arrest requiring CPR, acute hypoxic respiratory failure requiring intubation, upper GI bleed with acute blood loss anemia due to severe esophagitis requiring PRBC transfusion, severe metabolic encephalopathy/ICU delirium, bilateral lung abscesses with empyema requiring chest tube insertion--stabilized in the ICU-subsequently transferred to the [facility] service on 12/12, his chest tube was dislodged on the floor, he started developing increasing right-sided pleural effusion/empyema, patient was transferred back to ICU and chest tube was placed.  He was transferred back to [facility] on 12/18.  Worsening respiratory failure due to volume overload and patient was re-intubated on 12/19.   Continues to have intermittent AMS and issues with delirium.     Assessment/Plan:   Principal Problem:   Acute ST elevation myocardial infarction (STEMI) of anterolateral wall (HCC) Active Problems:   ESRD (end stage renal disease) (HCC)   UGIB (upper gastrointestinal bleed)   Essential hypertension   Anemia due to GI blood loss   Hyperlipidemia with target LDL less than 70   3 V- CAD w/ ACS/STEMI: Culprit = 99% mLAD @ D1 (DES PCI jailing D1); 99% ost-AVGLCx, 80% calcified napkin ring prox RCA.    Presence of drug coated stent in LAD coronary artery: Resolute Onyx DES 2.75 mm x 18 mm (3.1 mm) at major D1 & SP1.   Acute combined systolic and diastolic heart failure (HCC)   Atrial fibrillation with RVR (HCC)   Bacteremia   Septic shock due to  Staphylococcus aureus [facility])   Cardiac arrest ([place])   Pneumonia of right lower lobe due to methicillin susceptible Staphylococcus aureus (MSSA) (HCC)   Severe mitral regurgitation   Acute myocardial infarction ([place])   Acute ST elevation myocardial infarction (STEMI) involving left anterior descending (LAD) coronary artery (HCC)   Pressure injury of skin   Protein-calorie malnutrition, severe   Pleural effusion   Empyema (HCC)   Chest tube in place   PAF (paroxysmal atrial fibrillation) (HCC)    Delirium/AMS- worsened around 4 AM on 12/31 -unclear etiology: suspected uremia as resolved with HD -ammonia wnl -delirium precautions -U/A ordered but patient does not make urine -blood cultures for fever- NGTD -does have cough but x ray not suggestive of PNA -encourage incentive spirometry -hold on additional abx-- will touch base with ID if source not found/not resolved in 24-48 hours  OSA -CPAP at night  Acute STEMI with LAD stent -on Brilinta. -cardiology consult appreciated: Will defer plan for RCA PCI for now until more stable particularly with recent GI bleed and need for anticoagulation for intervention.  - Associated ischemic cardiomyopathy.    paroxysmal atrial fibrillation  - anticoagulation  held given GI bleeding during this hospitalization -amiodarone  MSSA bacteremia - Planning for cefazolin for 6 weeks total  Right parapneumonic effusion, loculated but improved drainage after DNase/TPA -chest tube removed   End-stage renal disease.   -Dialysis as per nephrology  -STRICT FLUID RESTRICTION  Pressure wound- sacral area, not present on admission Pressure Injury 11/14/20 Buttocks Mid;Right;Left Unstageable - Full thickness tissue loss in which the base of  the injury is covered by slough (yellow, tan, gray, green or brown) and/or eschar (tan, brown or black) in the wound bed. (Active)  11/14/20 1128  Location: Buttocks  Location Orientation: Mid;Right;Left   Staging: Unstageable - Full thickness tissue loss in which the base of the injury is covered by slough (yellow, tan, gray, green or brown) and/or eschar (tan, brown or black) in the wound bed.  Wound Description (Comments):   Present on Admission:     Anemia of CD and ABLA (GI bleed and bleeding from wound) -s/p 1 unit PRBC on 12/18   Hypercalcemia (corrected calcium 12.2) -monitor -decreased today -not on supplementation    Family Communication/Anticipated D/C date and plan/Code Status   DVT prophylaxis: heparin Code Status: Full Code.  Disposition Plan: Status is: Inpatient Spoke with mom at bedside and wife on phone Remains inpatient appropriate because:Ongoing diagnostic testing needed not appropriate for outpatient work up   Dispo: The patient is from: Home              Anticipated d/c is to: tbd CIR?              Anticipated d/c date is: 1-2 days              Patient currently is not medically stable to d/c.CIR?         Medical Consultants:    ID  PCCM  Renal  Cards  IR  WOC     Subjective:   No complaints, thinks his breathing is better.  He is eating well  Objective:    Vitals:   12/10/20 0500 12/10/20 0801 12/10/20 1241 12/10/20 1549  BP:  107/62 94/70 97/66   Pulse:  88 92 87  Resp:  20 20 20   Temp:  98.8 F (37.1 C) 98.4 F (36.9 C) 98.4 F (36.9 C)  TempSrc:  Oral Oral Oral  SpO2:  98% 98% 94%  Weight: 74.8 kg     Height:        Intake/Output Summary (Last 24 hours) at 12/10/2020 1649 Last data filed at 12/10/2020 1230 Gross per 24 hour  Intake 720 ml  Output -  Net 720 ml   Filed Weights   12/09/20 0500 12/09/20 1150 12/10/20 0500  Weight: 81.2 kg 80 kg 74.8 kg    Exam:   General: Appearance:    Weak appearing male in no acute distress     Lungs:     Diminished at bases, respirations unlabored  Heart:    Normal heart rate.   MS:   All extremities are intact. No LE edema  Neurologic:   Awake, alert, oriented x 3.  No apparent focal neurological           defect.     Data Reviewed:   I have personally reviewed following labs and imaging studies:  Labs: Labs show the following:   Basic Metabolic Panel: Recent Labs  Lab 12/04/20 0146 12/06/20 0428 12/08/20 0516 12/09/20 0103  NA 131* 132* 135 137  K 4.8 4.2 3.9 3.6  CL 90* 93* 95* 94*  CO2 24 24 26 29   GLUCOSE 99 98 107* 111*  BUN 135* 101* 89* 38*  CREATININE 8.99* 7.94* 8.32* 5.30*  CALCIUM 10.0 10.3 10.5* 10.3  MG 2.2  --   --   --   PHOS 5.4* 5.6* 5.2*  --    GFR Estimated Creatinine Clearance: 16.1 mL/min (A) (by C-G formula based on SCr of 5.3 mg/dL (H)). Liver Function  Tests: Recent Labs  Lab 12/04/20 0146 12/06/20 0428 12/08/20 0516 12/09/20 0103  AST  --   --   --  14*  ALT  --   --   --  5  ALKPHOS  --   --   --  106  BILITOT  --   --   --  0.6  PROT  --   --   --  6.7  ALBUMIN 1.8* 1.8* 1.9* 2.0*   No results for input(s): LIPASE, AMYLASE in the last 168 hours. Recent Labs  Lab 12/08/20 1716  AMMONIA 15   Coagulation profile No results for input(s): INR, PROTIME in the last 168 hours.  CBC: Recent Labs  Lab 12/05/20 1757 12/06/20 0428 12/08/20 0516 12/09/20 0103 12/10/20 0101  WBC QUESTIONABLE RESULTS, RECOMMEND RECOLLECT TO VERIFY 11.0* 11.7* 14.8* 12.8*  HGB QUESTIONABLE RESULTS, RECOMMEND RECOLLECT TO VERIFY 8.0* 8.0* 8.3* 7.8*  HCT QUESTIONABLE RESULTS, RECOMMEND RECOLLECT TO VERIFY 26.3* 24.8* 27.2* 26.1*  MCV QUESTIONABLE RESULTS, RECOMMEND RECOLLECT TO VERIFY 92.3 90.8 91.3 92.6  PLT QUESTIONABLE RESULTS, RECOMMEND RECOLLECT TO VERIFY 353 353 387 330   Cardiac Enzymes: No results for input(s): CKTOTAL, CKMB, CKMBINDEX, TROPONINI in the last 168 hours. BNP (last 3 results) No results for input(s): PROBNP in the last 8760 hours. CBG: Recent Labs  Lab 12/09/20 1949 12/09/20 2344 12/10/20 0320 12/10/20 0803 12/10/20 1602  GLUCAP 90 102* 98 87 91   D-Dimer: No results for input(s):  DDIMER in the last 72 hours. Hgb A1c: No results for input(s): HGBA1C in the last 72 hours. Lipid Profile: No results for input(s): CHOL, HDL, LDLCALC, TRIG, CHOLHDL, LDLDIRECT in the last 72 hours. Thyroid function studies: No results for input(s): TSH, T4TOTAL, T3FREE, THYROIDAB in the last 72 hours.  Invalid input(s): FREET3 Anemia work up: No results for input(s): VITAMINB12, FOLATE, FERRITIN, TIBC, IRON, RETICCTPCT in the last 72 hours. Sepsis Labs: Recent Labs  Lab 12/06/20 0428 12/08/20 0516 12/09/20 0103 12/10/20 0101  WBC 11.0* 11.7* 14.8* 12.8*    Microbiology Recent Results (from the past 240 hour(s))  Culture, blood (Routine X 2) w Reflex to ID Panel     Status: None (Preliminary result)   Collection Time: 12/09/20 10:16 AM   Specimen: BLOOD  Result Value Ref Range Status   Specimen Description BLOOD RIGHT ANTECUBITAL  Final   Special Requests   Final    BOTTLES DRAWN AEROBIC AND ANAEROBIC Blood Culture results may not be optimal due to an inadequate volume of blood received in culture bottles   Culture   Final    NO GROWTH < 24 HOURS Performed at [facility], [place] [address], [place], [place] [address]    Report Status PENDING  Incomplete    Procedures and diagnostic studies:  No results found.  Medications:   . (feeding supplement) PROSource Plus  30 mL Oral TID PC & HS  . amiodarone  200 mg Oral BID  . atorvastatin  80 mg Oral q1800  . Chlorhexidine Gluconate Cloth  6 each Topical Q0600  . collagenase   Topical Daily  . [START ON 12/11/2020] darbepoetin (ARANESP) injection - DIALYSIS  100 mcg Intravenous Q Mon-HD  . docusate sodium  100 mg Oral BID  . heparin injection (subcutaneous)  5,000 Units Subcutaneous Q8H  . lactobacillus acidophilus  1 tablet Oral TID  . mouth rinse  15 mL Mouth Rinse BID  . melatonin  5 mg Oral QHS  . metoprolol tartrate  37.5 mg  Oral BID  . midodrine  5 mg Oral TID WC  . multivitamin  1 tablet Oral QHS  .  nutrition supplement (JUVEN)  1 packet Oral BID BM  . pantoprazole  40 mg Oral BID  . polyethylene glycol  17 g Oral Daily  . QUEtiapine  75 mg Oral QHS  . sevelamer carbonate  1.6 g Oral TID WC  . sodium chloride flush  3 mL Intravenous Q12H  . ticagrelor  90 mg Oral BID   Continuous Infusions: . sodium chloride 250 mL (11/27/20 0902)  . sodium chloride    .  ceFAZolin (ANCEF) IV 2 g (12/08/20 1552)     LOS: 15 days   Geradine Girt  [facility]   How to contact the [facility] Attending or Consulting provider [place] or covering provider during after hours [place], for this patient?  1. Check the care team in [facility] and look for a) attending/consulting [facility] provider listed and b) the [facility] team listed 2. Log into [email or web address] and use [place]'s universal password to access. If you do not have the password, please contact the hospital operator. 3. Locate the [facility] provider you are looking for under [facility] and page to a number that you can be directly reached. 4. If you still have difficulty reaching the provider, please page the [facility] (Director on Call) for the Hospitalists listed on amion for assistance.  12/10/2020, 4:49 PM

## 2020-12-10 NOTE — Progress Notes (Signed)
Patient ID: Travis [name], male   DOB: 1961/04/06, 60 y.o.   MRN: [id number]  [facility] Progress Note   Assessment/ Plan:   1. Acute hypoxic respiratory failure: CT showed diffuse perihilar GG changes, most likely pulm edema/ vol overload. Was back on vent 12/19- 12/22.  Last CXR 12/31 showed no edema, old infiltrates resolving from pna episode.  2. AMS: on 12/31 w/ uremic odor, ^BUN/Cr. Bake to baseline after HD yest, suspect uremia. Extra HD Sat, will need full HD times here given tendency for azotemia/ uremia.  3. Afib / RVR - getting po metoprolol and amio po.  4. ESRD: on HD MWF schedule. SP CRRT course x 2. Heparin not being used due to significant ABLA/GI bleed recently.  Run full HD time in hospital 4hr. HD Monday.  5. Volume: recurrent issue in hospital. BP's down, wt's down significantly . No edema on exam. BP's are soft in 90's today, lower UF goal 2 L tomorrow as BP's tolerate. Is on metoprolol for afib. Will resume midodrine 5 mg tid for now.  6. Anemia: Multifactorial with acute illness and now likely complicated by malnutrition/inflammatory complex with sacral decubitus. Hb's still low in 8 range, ^ esa 60 > 100 ug q Monday, w/ as needed iron.  Recently worsened GI bleed secondary to duodenal ulcers and esophagitis.   7. CKD-MBD: Calcium and phosphorus level currently at goal, continue strategies to optimize nutrition while on sevelamer 3 times daily AC. 8. Nutrition: Ongoing tube feeds at this time with protein supplementation to help improve healing of large sacral decubitus. 9. History of acute STEMI: admit diagnosis, sp PCI to LAD on 10/2019.  On Brilinta due to multifocal/stable disease. 10. Sacral decubitus (unstageable): Wound care with hydrotherapy and pressure alleviating devices. Maintain optimization of nutritional status to help wound healing. May not tolerate HD in a chair.  11. Bilat MSSA cavitary PNA w/ empyema: rx'd w/ abx and chest tubes.   Kelly Splinter, MD 12/10/2020, 2:11 PM    Subjective:    Seen in SDU. Doing well, feeding self breakfast.    Objective:   BP 94/70 (BP Location: Right Arm)   Pulse 92   Temp 98.4 F (36.9 C) (Oral)   Resp 20   Ht 5\' 11"  (1.803 m)   Wt 74.8 kg Comment: scale B  SpO2 98%   BMI 23.00 kg/m   Physical Exam: Gen: Awake, alert CVS: RRR no RG Resp: lungs clear bilat no rales Abd: Soft, nondistended, bowel sounds normal Ext: trace pedal edema LUA AVF+bruit  OP HD: AF MWF   4h  450/800  81.5kg  2/2.25 bath  RUE AVF  Hep none, on hold for inpt GI bleeding issues (was on 5000u bolus+ 1000 midrun)   - hect 5 ug tiw   -  mircera 50 ug q 4, last 11/15 (due 11/29)  CXR 12/31 - IMPRESSION: 1. Improved aeration of both lungs compared to yesterday's chest x-ray. Probable small bilateral pleural effusions. 2. Stable cardiomegaly.   Labs: BMET Recent Labs  Lab 12/04/20 0146 12/06/20 0428 12/08/20 0516 12/09/20 0103  NA 131* 132* 135 137  K 4.8 4.2 3.9 3.6  CL 90* 93* 95* 94*  CO2 24 24 26 29   GLUCOSE 99 98 107* 111*  BUN 135* 101* 89* 38*  CREATININE 8.99* 7.94* 8.32* 5.30*  CALCIUM 10.0 10.3 10.5* 10.3  PHOS 5.4* 5.6* 5.2*  --    CBC Recent Labs  Lab 12/06/20 0428 12/08/20 0516 12/09/20  0103 12/10/20 0101  WBC 11.0* 11.7* 14.8* 12.8*  HGB 8.0* 8.0* 8.3* 7.8*  HCT 26.3* 24.8* 27.2* 26.1*  MCV 92.3 90.8 91.3 92.6  PLT 353 353 387 330      Medications:    . (feeding supplement) PROSource Plus  30 mL Oral TID PC & HS  . amiodarone  200 mg Oral BID  . atorvastatin  80 mg Oral q1800  . Chlorhexidine Gluconate Cloth  6 each Topical Q0600  . collagenase   Topical Daily  . darbepoetin (ARANESP) injection - DIALYSIS  60 mcg Intravenous Q Mon-HD  . docusate sodium  100 mg Oral BID  . heparin injection (subcutaneous)  5,000 Units Subcutaneous Q8H  . lactobacillus acidophilus  1 tablet Oral TID  . mouth rinse  15 mL Mouth Rinse BID  . melatonin  5 mg Oral QHS  . metoprolol  tartrate  37.5 mg Oral BID  . multivitamin  1 tablet Oral QHS  . nutrition supplement (JUVEN)  1 packet Oral BID BM  . pantoprazole  40 mg Oral BID  . polyethylene glycol  17 g Oral Daily  . QUEtiapine  75 mg Oral QHS  . sevelamer carbonate  1.6 g Oral TID WC  . sodium chloride flush  3 mL Intravenous Q12H  . ticagrelor  90 mg Oral BID

## 2020-12-10 NOTE — Progress Notes (Signed)
Placed patient on CPAP for the night via auto-mode with oxygen set at 3lpm.   

## 2020-12-11 DIAGNOSIS — I2109 ST elevation (STEMI) myocardial infarction involving other coronary artery of anterior wall: Secondary | ICD-10-CM | POA: Diagnosis not present

## 2020-12-11 LAB — RENAL FUNCTION PANEL
Albumin: 2 g/dL — ABNORMAL LOW (ref 3.5–5.0)
Anion gap: 11 (ref 5–15)
BUN: 72 mg/dL — ABNORMAL HIGH (ref 6–20)
CO2: 26 mmol/L (ref 22–32)
Calcium: 10.1 mg/dL (ref 8.9–10.3)
Chloride: 97 mmol/L — ABNORMAL LOW (ref 98–111)
Creatinine, Ser: 7.16 mg/dL — ABNORMAL HIGH (ref 0.61–1.24)
GFR, Estimated: 8 mL/min — ABNORMAL LOW (ref >60)
Glucose, Bld: 88 mg/dL (ref 70–99)
Phosphorus: 5.6 mg/dL — ABNORMAL HIGH (ref 2.5–4.6)
Potassium: 3.5 mmol/L (ref 3.5–5.1)
Sodium: 134 mmol/L — ABNORMAL LOW (ref 135–145)

## 2020-12-11 LAB — CBC
HCT: 24.3 % — ABNORMAL LOW (ref 39.0–52.0)
Hemoglobin: 7.4 g/dL — ABNORMAL LOW (ref 13.0–17.0)
MCH: 28 pg (ref 26.0–34.0)
MCHC: 30.5 g/dL (ref 30.0–36.0)
MCV: 92 fL (ref 80.0–100.0)
Platelets: 321 10*3/uL (ref 150–400)
RBC: 2.64 MIL/uL — ABNORMAL LOW (ref 4.22–5.81)
RDW: 13.8 % (ref 11.5–15.5)
WBC: 12.3 10*3/uL — ABNORMAL HIGH (ref 4.0–10.5)
nRBC: 0 % (ref 0.0–0.2)

## 2020-12-11 LAB — MRSA PCR SCREENING: MRSA by PCR: NEGATIVE

## 2020-12-11 MED ORDER — DARBEPOETIN ALFA 100 MCG/0.5ML IJ SOSY
PREFILLED_SYRINGE | INTRAMUSCULAR | Status: AC
  Filled 2020-12-11: qty 0.5

## 2020-12-11 NOTE — Progress Notes (Signed)
PT Cancellation Note  Patient Details Name: ARK AGRUSA MRN: [id number] DOB: 10-Apr-1961   Cancelled Treatment:    Reason Eval/Treat Not Completed: (P) Patient at procedure or test/unavailable Pt is off floor for HD. PT will follow back for treatment this afternoon as able.   Aalyssa Elderkin B. Migdalia Dk PT, DPT Acute Rehabilitation Services Pager [phone] Office [phone]   [place] 12/11/2020, 8:34 AM

## 2020-12-11 NOTE — Consult Note (Signed)
[place] Nurse wound follow up Patient receiving care in [facility] 2C08. Wound type: stage 4 sacral wound Measurement: deferred--I had just enough time to view the wound; transport was waiting to take him to HD. Wound bed: 98% pink Drainage (amount, consistency, odor) none Periwound: intact Dressing procedure/placement/frequency: Place saline moistened gauze into the sacral wound, but sure to place it under all undermining around the wound. Then cover with an ABD pad. Val Riles, RN, MSN, CWOCN, CNS-BC, pager [phone]

## 2020-12-11 NOTE — Progress Notes (Signed)
Patient refused CPAP HS tonight. Patient states he will be ok without CPAP tonight.

## 2020-12-11 NOTE — Progress Notes (Deleted)
Delay in start of blood due to pump error.. 30 ml administered and VS completed.. 15 minute check

## 2020-12-11 NOTE — Procedures (Signed)
I was present at this dialysis session. I have reviewed the session itself and made appropriate changes.   4K, UF 2L.  For ESA today. Feels good, good appetite, good spirits. AVF.  No issues to start Tx.     Filed Weights   12/09/20 1150 12/10/20 0500 12/11/20 0320  Weight: 80 kg 74.8 kg 75.7 kg    Recent Labs  Lab 12/11/20 0617  NA 134*  K 3.5  CL 97*  CO2 26  GLUCOSE 88  BUN 72*  CREATININE 7.16*  CALCIUM 10.1  PHOS 5.6*    Recent Labs  Lab 12/09/20 0103 12/10/20 0101 12/11/20 0617  WBC 14.8* 12.8* 12.3*  HGB 8.3* 7.8* 7.4*  HCT 27.2* 26.1* 24.3*  MCV 91.3 92.6 92.0  PLT 387 330 321    Scheduled Meds: . (feeding supplement) PROSource Plus  30 mL Oral TID PC & HS  . amiodarone  200 mg Oral BID  . atorvastatin  80 mg Oral q1800  . Chlorhexidine Gluconate Cloth  6 each Topical Q0600  . darbepoetin (ARANESP) injection - DIALYSIS  100 mcg Intravenous Q Mon-HD  . docusate sodium  100 mg Oral BID  . heparin injection (subcutaneous)  5,000 Units Subcutaneous Q8H  . lactobacillus acidophilus  1 tablet Oral TID  . mouth rinse  15 mL Mouth Rinse BID  . melatonin  5 mg Oral QHS  . metoprolol tartrate  37.5 mg Oral BID  . midodrine  5 mg Oral TID WC  . multivitamin  1 tablet Oral QHS  . nutrition supplement (JUVEN)  1 packet Oral BID BM  . pantoprazole  40 mg Oral BID  . polyethylene glycol  17 g Oral Daily  . QUEtiapine  75 mg Oral QHS  . sevelamer carbonate  1.6 g Oral TID WC  . sodium chloride flush  3 mL Intravenous Q12H  . ticagrelor  90 mg Oral BID   Continuous Infusions: . sodium chloride 250 mL (11/27/20 0902)  . sodium chloride    .  ceFAZolin (ANCEF) IV 2 g (12/08/20 1552)   PRN Meds:.sodium chloride, acetaminophen, heparin, hydrocerin, LORazepam, ondansetron (ZOFRAN) IV, oxyCODONE, Resource ThickenUp Clear, senna, silver nitrate applicators, sodium chloride, sodium chloride flush   Pearson Grippe  MD 12/11/2020, 8:51 AM

## 2020-12-11 NOTE — Progress Notes (Signed)
Inpatient Rehabilitation-Admissions Coordinator   Continuing to follow for tolerance for CIR. Given pt's extensive wound, will need to ensure he can tolerate sitting up in a recliner (in prep for OP HD at DC) and tolerate intensive rehab.   Raechel Ache, OTR/L  Rehab Admissions Coordinator  [phone] 12/11/2020 11:14 AM

## 2020-12-11 NOTE — Progress Notes (Addendum)
.    Progress Note    Travis [name]  HCW:[id number] DOB: [birth year]  DOA: 10/28/2020 PCP: London Pepper, MD    Brief Narrative:     Medical records reviewed and are as summarized below:  Travis [name] is an 60 y.o. male with history of ESRD, HTN, CAD-who presented with anterior STEMI-treated with DES to LAD-hospital course prolonged and complicated by atrial fibrillation with RVR requiring cardioversion x2,, septic shock due to MSSA bacteremia, brief PEA arrest requiring CPR, acute hypoxic respiratory failure requiring intubation, upper GI bleed with acute blood loss anemia due to severe esophagitis requiring PRBC transfusion, severe metabolic encephalopathy/ICU delirium, bilateral lung abscesses with empyema requiring chest tube insertion--stabilized in the ICU-subsequently transferred to the [facility] on 12/12, his chest tube was dislodged on the floor, he started developing increasing right-sided pleural effusion/empyema, patient was transferred back to ICU and chest tube was placed.  He was transferred back to [facility] on 12/18.  Worsening respiratory failure due to volume overload and patient was re-intubated on 12/19.   Slowly improving and working to transition patient to rehab.      Assessment/Plan:   Principal Problem:   Acute ST elevation myocardial infarction (STEMI) of anterolateral wall (HCC) Active Problems:   ESRD (end stage renal disease) (HCC)   UGIB (upper gastrointestinal bleed)   Essential hypertension   Anemia due to GI blood loss   Hyperlipidemia with target LDL less than 70   3 V- CAD w/ ACS/STEMI: Culprit = 99% mLAD @ D1 (DES PCI jailing D1); 99% ost-AVGLCx, 80% calcified napkin ring prox RCA.    Presence of drug coated stent in LAD coronary artery: Resolute Onyx DES 2.75 mm x 18 mm (3.1 mm) at major D1 & SP1.   Acute combined systolic and diastolic heart failure (HCC)   Atrial fibrillation with RVR (HCC)   Bacteremia   Septic shock due to  Staphylococcus aureus [facility])   Cardiac arrest ([employer])   Pneumonia of right lower lobe due to methicillin susceptible Staphylococcus aureus (MSSA) (HCC)   Severe mitral regurgitation   Acute myocardial infarction (HCC)   Acute ST elevation myocardial infarction (STEMI) involving left anterior descending (LAD) coronary artery (HCC)   Pressure injury of skin   Protein-calorie malnutrition, severe   Pleural effusion   Empyema (HCC)   Chest tube in place   PAF (paroxysmal atrial fibrillation) (HCC)    Delirium/AMS -resolved -suspect due to uremia as better after HD  OSA -CPAP at night  Acute STEMI with LAD stent -on Brilinta. -cardiology consult appreciated: Will defer plan for RCA PCI for now until more stable particularly with recent GI bleed and need for anticoagulation for intervention.  - Associated ischemic cardiomyopathy.    paroxysmal atrial fibrillation  - anticoagulation  held given GI bleeding during this hospitalization -amiodarone  MSSA bacteremia - Planning for cefazolin for 6 weeks total  Right parapneumonic effusion, loculated but improved drainage after DNase/TPA -chest tube removed   End-stage renal disease.   -Dialysis as per nephrology  -STRICT FLUID RESTRICTION -has been tolerating close to 2 hours daily in a chair already  Pressure wound- sacral area, not present on admission Pressure Injury 11/14/20 Buttocks Mid;Right;Left Unstageable - Full thickness tissue loss in which the base of the injury is covered by slough (yellow, tan, gray, green or brown) and/or eschar (tan, brown or black) in the wound bed. (Active)  11/14/20 1128  Location: Buttocks  Location Orientation: Mid;Right;Left  Staging: Unstageable - Full thickness  tissue loss in which the base of the injury is covered by slough (yellow, tan, gray, green or brown) and/or eschar (tan, brown or black) in the wound bed.  Wound Description (Comments):   Present on Admission:     Anemia of CD and  ABLA (GI bleed and bleeding from wound) -s/p 14 units since admission   Hypercalcemia (corrected calcium 12.2) -resolved    Family Communication/Anticipated D/C date and plan/Code Status   DVT prophylaxis: heparin Code Status: Full Code.  Disposition Plan: Status is: Inpatient Spoke with dad at bedside Remains inpatient appropriate because:Ongoing diagnostic testing needed not appropriate for outpatient work up   Dispo: The patient is from: Home              Anticipated d/c is to: tbd CIR when bed available?              Anticipated d/c date is: 1-2 days                       Medical Consultants:    ID  PCCM  Renal  Cards  IR  WOC     Subjective:   Excited to have had 3 days in a row where is progressing towards being able to go to rehab Eating well Excited that his dad will be cutting his hair/beard  Objective:    Vitals:   12/11/20 1256 12/11/20 1300 12/11/20 1328 12/11/20 1459  BP:  104/68 (!) 90/53 (!) 85/66  Pulse:   87   Resp:  (!) 22 20 20   Temp: 98.8 F (37.1 C) 98.5 F (36.9 C) 98.5 F (36.9 C) 97.9 F (36.6 C)  TempSrc: Oral Oral  Oral  SpO2: 98% 97% 93%   Weight: 74.8 kg     Height:        Intake/Output Summary (Last 24 hours) at 12/11/2020 1513 Last data filed at 12/11/2020 1456 Gross per 24 hour  Intake 3 ml  Output 1322 ml  Net -1319 ml   Filed Weights   12/11/20 0320 12/11/20 0845 12/11/20 1256  Weight: 75.7 kg 76.1 kg 74.8 kg    Exam:  General: Appearance:    Chronically ill appearing male in no acute distress-- sitting in chair visiting with father   Small string on right side of chest from prior chest tube  Lungs:     respirations unlabored, diminished,   Heart:    Normal heart rate.   MS:   All extremities are intact.   Neurologic:   Awake, alert, oriented x 3. No apparent focal neurological           defect.      Data Reviewed:   I have personally reviewed following labs and imaging  studies:  Labs: Labs show the following:   Basic Metabolic Panel: Recent Labs  Lab 12/06/20 0428 12/08/20 0516 12/09/20 0103 12/11/20 0617  NA 132* 135 137 134*  K 4.2 3.9 3.6 3.5  CL 93* 95* 94* 97*  CO2 24 26 29 26   GLUCOSE 98 107* 111* 88  BUN 101* 89* 38* 72*  CREATININE 7.94* 8.32* 5.30* 7.16*  CALCIUM 10.3 10.5* 10.3 10.1  PHOS 5.6* 5.2*  --  5.6*   GFR Estimated Creatinine Clearance: 11.9 mL/min (A) (by C-G formula based on SCr of 7.16 mg/dL (H)). Liver Function Tests: Recent Labs  Lab 12/06/20 0428 12/08/20 0516 12/09/20 0103 12/11/20 0617  AST  --   --  14*  --  ALT  --   --  5  --   ALKPHOS  --   --  106  --   BILITOT  --   --  0.6  --   PROT  --   --  6.7  --   ALBUMIN 1.8* 1.9* 2.0* 2.0*   No results for input(s): LIPASE, AMYLASE in the last 168 hours. Recent Labs  Lab 12/08/20 1716  AMMONIA 15   Coagulation profile No results for input(s): INR, PROTIME in the last 168 hours.  CBC: Recent Labs  Lab 12/06/20 0428 12/08/20 0516 12/09/20 0103 12/10/20 0101 12/11/20 0617  WBC 11.0* 11.7* 14.8* 12.8* 12.3*  HGB 8.0* 8.0* 8.3* 7.8* 7.4*  HCT 26.3* 24.8* 27.2* 26.1* 24.3*  MCV 92.3 90.8 91.3 92.6 92.0  PLT 353 353 387 330 321   Cardiac Enzymes: No results for input(s): CKTOTAL, CKMB, CKMBINDEX, TROPONINI in the last 168 hours. BNP (last 3 results) No results for input(s): PROBNP in the last 8760 hours. CBG: Recent Labs  Lab 12/09/20 1949 12/09/20 2344 12/10/20 0320 12/10/20 0803 12/10/20 1602  GLUCAP 90 102* 98 87 91   D-Dimer: No results for input(s): DDIMER in the last 72 hours. Hgb A1c: No results for input(s): HGBA1C in the last 72 hours. Lipid Profile: No results for input(s): CHOL, HDL, LDLCALC, TRIG, CHOLHDL, LDLDIRECT in the last 72 hours. Thyroid function studies: No results for input(s): TSH, T4TOTAL, T3FREE, THYROIDAB in the last 72 hours.  Invalid input(s): FREET3 Anemia work up: No results for input(s):  VITAMINB12, FOLATE, FERRITIN, TIBC, IRON, RETICCTPCT in the last 72 hours. Sepsis Labs: Recent Labs  Lab 12/08/20 0516 12/09/20 0103 12/10/20 0101 12/11/20 0617  WBC 11.7* 14.8* 12.8* 12.3*    Microbiology Recent Results (from the past 240 hour(s))  Culture, blood (Routine X 2) w Reflex to ID Panel     Status: None (Preliminary result)   Collection Time: 12/09/20 10:16 AM   Specimen: BLOOD  Result Value Ref Range Status   Specimen Description BLOOD RIGHT ANTECUBITAL  Final   Special Requests   Final    BOTTLES DRAWN AEROBIC AND ANAEROBIC Blood Culture results may not be optimal due to an inadequate volume of blood received in culture bottles   Culture   Final    NO GROWTH 2 DAYS Performed at [facility], [place] [address], [place],  [address]    Report Status PENDING  Incomplete    Procedures and diagnostic studies:  No results found.  Medications:   . (feeding supplement) PROSource Plus  30 mL Oral TID PC & HS  . amiodarone  200 mg Oral BID  . atorvastatin  80 mg Oral q1800  . Chlorhexidine Gluconate Cloth  6 each Topical Q0600  . Darbepoetin Alfa      . darbepoetin (ARANESP) injection - DIALYSIS  100 mcg Intravenous Q Mon-HD  . docusate sodium  100 mg Oral BID  . heparin injection (subcutaneous)  5,000 Units Subcutaneous Q8H  . lactobacillus acidophilus  1 tablet Oral TID  . mouth rinse  15 mL Mouth Rinse BID  . melatonin  5 mg Oral QHS  . metoprolol tartrate  37.5 mg Oral BID  . midodrine  5 mg Oral TID WC  . multivitamin  1 tablet Oral QHS  . nutrition supplement (JUVEN)  1 packet Oral BID BM  . pantoprazole  40 mg Oral BID  . polyethylene glycol  17 g Oral Daily  . QUEtiapine  75 mg Oral  QHS  . sevelamer carbonate  1.6 g Oral TID WC  . sodium chloride flush  3 mL Intravenous Q12H  . ticagrelor  90 mg Oral BID   Continuous Infusions: . sodium chloride 250 mL (11/27/20 0902)  . sodium chloride    .  ceFAZolin (ANCEF) IV Stopped (12/11/20 1206)      LOS: 53 days   Geradine Girt  [facility]   How to contact the [facility] Attending or Consulting provider [place] or covering provider during after hours [place], for this patient?  1. Check the care team in [facility] and look for a) attending/consulting [facility] provider listed and b) the [facility] team listed 2. Log into [email or web address] and use [place]'s universal password to access. If you do not have the password, please contact the hospital operator. 3. Locate the [facility] provider you are looking for under [facility] and page to a number that you can be directly reached. 4. If you still have difficulty reaching the provider, please page the [facility] (Director on Call) for the Hospitalists listed on amion for assistance.  12/11/2020, 3:13 PM

## 2020-12-12 DIAGNOSIS — I2109 ST elevation (STEMI) myocardial infarction involving other coronary artery of anterior wall: Secondary | ICD-10-CM | POA: Diagnosis not present

## 2020-12-12 NOTE — Progress Notes (Signed)
Inpatient Rehabilitation-Admissions Coordinator   Reviewed case with PM&R MD. Pt appears to be appropriate for CIR. However, I do not have a bed available in CIR today for this patient. Will follow up tomorrow for possible admit, pending bed availability.   Raechel Ache, OTR/L  Rehab Admissions Coordinator  [phone] 12/12/2020 10:32 AM

## 2020-12-12 NOTE — Progress Notes (Signed)
.    Progress Note    Travis [name]  QIW:[id number] DOB: 13-Nov-1961  DOA: 10/28/2020 PCP: London Pepper, MD    Brief Narrative:     Medical records reviewed and are as summarized below:  Travis [name] is an 60 y.o. male with history of ESRD, HTN, CAD-who presented with anterior STEMI-treated with DES to LAD-hospital course prolonged and complicated by atrial fibrillation with RVR requiring cardioversion x2,, septic shock due to MSSA bacteremia, brief PEA arrest requiring CPR, acute hypoxic respiratory failure requiring intubation, upper GI bleed with acute blood loss anemia due to severe esophagitis requiring PRBC transfusion, severe metabolic encephalopathy/ICU delirium, bilateral lung abscesses with empyema requiring chest tube insertion--stabilized in the ICU-subsequently transferred to the [facility] on 12/12, his chest tube was dislodged on the floor, he started developing increasing right-sided pleural effusion/empyema, patient was transferred back to ICU and chest tube was placed.  He was transferred back to [facility] on 12/18.  Worsening respiratory failure due to volume overload and patient was re-intubated on 12/19.   Slowly improving and working to transition patient to rehab.      Assessment/Plan:   Principal Problem:   Acute ST elevation myocardial infarction (STEMI) of anterolateral wall (HCC) Active Problems:   ESRD (end stage renal disease) (HCC)   UGIB (upper gastrointestinal bleed)   Essential hypertension   Anemia due to GI blood loss   Hyperlipidemia with target LDL less than 70   3 V- CAD w/ ACS/STEMI: Culprit = 99% mLAD @ D1 (DES PCI jailing D1); 99% ost-AVGLCx, 80% calcified napkin ring prox RCA.    Presence of drug coated stent in LAD coronary artery: Resolute Onyx DES 2.75 mm x 18 mm (3.1 mm) at major D1 & SP1.   Acute combined systolic and diastolic heart failure (HCC)   Atrial fibrillation with RVR (HCC)   Bacteremia   Septic shock due to  Staphylococcus aureus [facility])   Cardiac arrest ([place])   Pneumonia of right lower lobe due to methicillin susceptible Staphylococcus aureus (MSSA) (HCC)   Severe mitral regurgitation   Acute myocardial infarction (HCC)   Acute ST elevation myocardial infarction (STEMI) involving left anterior descending (LAD) coronary artery (HCC)   Pressure injury of skin   Protein-calorie malnutrition, severe   Pleural effusion   Empyema (HCC)   Chest tube in place   PAF (paroxysmal atrial fibrillation) (HCC)    Delirium/AMS -resolved -suspect due to uremia as better after HD -no clear sign of infection  OSA- history of -CPAP at night if wanted  Acute STEMI with LAD stent -on Brilinta. -cardiology consult appreciated: Will defer plan for RCA PCI for now until more stable particularly with recent GI bleed and need for anticoagulation for intervention.  - Associated ischemic cardiomyopathy.    paroxysmal atrial fibrillation  - anticoagulation  held given GI bleeding during this hospitalization -amiodarone  MSSA bacteremia - Planning for cefazolin for 6 weeks total  Right parapneumonic effusion, loculated but improved drainage after DNase/TPA -chest tube removed   End-stage renal disease.   -Dialysis as per nephrology  -STRICT FLUID RESTRICTION -has been tolerating close to 2 hours daily in a chair already  Pressure wound- sacral area, not present on admission Pressure Injury 11/14/20 Buttocks Mid;Right;Left Unstageable - Full thickness tissue loss in which the base of the injury is covered by slough (yellow, tan, gray, green or brown) and/or eschar (tan, brown or black) in the wound bed. (Active)  11/14/20 1128  Location: Buttocks  Location Orientation: Mid;Right;Left  Staging: Unstageable - Full thickness tissue loss in which the base of the injury is covered by slough (yellow, tan, gray, green or brown) and/or eschar (tan, brown or black) in the wound bed.  Wound Description  (Comments):   Present on Admission:   -wound care in process  Anemia of CD and ABLA (GI bleed and bleeding from wound) -s/p 14 units since admission   Hypercalcemia -resolved    Family Communication/Anticipated D/C date and plan/Code Status   DVT prophylaxis: heparin Code Status: Full Code.  Disposition Plan: Status is: Inpatient Spoke with dad at bedside Remains inpatient appropriate because:Ongoing diagnostic testing needed not appropriate for outpatient work up   Dispo: The patient is from: Home              Anticipated d/c is to: tbd CIR when bed available?              Anticipated d/c date is: 1-2 days                       Medical Consultants:    ID  PCCM  Renal  Cards  IR  WOC     Subjective:   Confused about what he was told from the rehab liaison -- thinks he was told he needed to go home  Objective:    Vitals:   12/12/20 0820 12/12/20 0855 12/12/20 1131 12/12/20 1225  BP: (!) 85/47 105/70  108/73  Pulse: 77 88  86  Resp: (!) 22   (!) 23  Temp: 98 F (36.7 C)     TempSrc: Oral     SpO2: 98%  97%   Weight:      Height:        Intake/Output Summary (Last 24 hours) at 12/12/2020 1233 Last data filed at 12/12/2020 1141 Gross per 24 hour  Intake 6 ml  Output 2672 ml  Net -2666 ml   Filed Weights   12/11/20 0845 12/11/20 1256 12/12/20 0330  Weight: 76.1 kg 74.8 kg 73.5 kg    Exam:  General: Appearance:    Well developed, well nourished male in no acute distress     Lungs:    100% on 2L [place], respirations unlabored, diminished  Heart:    Normal heart rate.   MS:   All extremities are intact. No LE edema  Neurologic:   Awake, alert, seems confused to situation some today     Data Reviewed:   I have personally reviewed following labs and imaging studies:  Labs: Labs show the following:   Basic Metabolic Panel: Recent Labs  Lab 12/06/20 0428 12/08/20 0516 12/09/20 0103 12/11/20 0617  NA 132* 135 137 134*  K 4.2 3.9  3.6 3.5  CL 93* 95* 94* 97*  CO2 24 26 29 26   GLUCOSE 98 107* 111* 88  BUN 101* 89* 38* 72*  CREATININE 7.94* 8.32* 5.30* 7.16*  CALCIUM 10.3 10.5* 10.3 10.1  PHOS 5.6* 5.2*  --  5.6*   GFR Estimated Creatinine Clearance: 11.7 mL/min (A) (by C-G formula based on SCr of 7.16 mg/dL (H)). Liver Function Tests: Recent Labs  Lab 12/06/20 0428 12/08/20 0516 12/09/20 0103 12/11/20 0617  AST  --   --  14*  --   ALT  --   --  5  --   ALKPHOS  --   --  106  --   BILITOT  --   --  0.6  --  PROT  --   --  6.7  --   ALBUMIN 1.8* 1.9* 2.0* 2.0*   No results for input(s): LIPASE, AMYLASE in the last 168 hours. Recent Labs  Lab 12/08/20 1716  AMMONIA 15   Coagulation profile No results for input(s): INR, PROTIME in the last 168 hours.  CBC: Recent Labs  Lab 12/06/20 0428 12/08/20 0516 12/09/20 0103 12/10/20 0101 12/11/20 0617  WBC 11.0* 11.7* 14.8* 12.8* 12.3*  HGB 8.0* 8.0* 8.3* 7.8* 7.4*  HCT 26.3* 24.8* 27.2* 26.1* 24.3*  MCV 92.3 90.8 91.3 92.6 92.0  PLT 353 353 387 330 321   Cardiac Enzymes: No results for input(s): CKTOTAL, CKMB, CKMBINDEX, TROPONINI in the last 168 hours. BNP (last 3 results) No results for input(s): PROBNP in the last 8760 hours. CBG: Recent Labs  Lab 12/09/20 1949 12/09/20 2344 12/10/20 0320 12/10/20 0803 12/10/20 1602  GLUCAP 90 102* 98 87 91   D-Dimer: No results for input(s): DDIMER in the last 72 hours. Hgb A1c: No results for input(s): HGBA1C in the last 72 hours. Lipid Profile: No results for input(s): CHOL, HDL, LDLCALC, TRIG, CHOLHDL, LDLDIRECT in the last 72 hours. Thyroid function studies: No results for input(s): TSH, T4TOTAL, T3FREE, THYROIDAB in the last 72 hours.  Invalid input(s): FREET3 Anemia work up: No results for input(s): VITAMINB12, FOLATE, FERRITIN, TIBC, IRON, RETICCTPCT in the last 72 hours. Sepsis Labs: Recent Labs  Lab 12/08/20 0516 12/09/20 0103 12/10/20 0101 12/11/20 0617  WBC 11.7* 14.8* 12.8*  12.3*    Microbiology Recent Results (from the past 240 hour(s))  Culture, blood (Routine X 2) w Reflex to ID Panel     Status: None (Preliminary result)   Collection Time: 12/09/20 10:16 AM   Specimen: BLOOD  Result Value Ref Range Status   Specimen Description BLOOD RIGHT ANTECUBITAL  Final   Special Requests   Final    BOTTLES DRAWN AEROBIC AND ANAEROBIC Blood Culture results may not be optimal due to an inadequate volume of blood received in culture bottles   Culture   Final    NO GROWTH 3 DAYS Performed at [facility], [place] [address], [place], [place] [address]    Report Status PENDING  Incomplete  MRSA PCR Screening     Status: None   Collection Time: 12/11/20  3:48 PM   Specimen: Nasal Mucosa; Nasopharyngeal  Result Value Ref Range Status   MRSA by PCR NEGATIVE NEGATIVE Final    Comment:        The GeneXpert MRSA Assay (FDA approved for NASAL specimens only), is one component of a comprehensive MRSA colonization surveillance program. It is not intended to diagnose MRSA infection nor to guide or monitor treatment for MRSA infections. Performed at [facility], [place] [address], [place],  [address]     Procedures and diagnostic studies:  No results found.  Medications:   . (feeding supplement) PROSource Plus  30 mL Oral TID PC & HS  . amiodarone  200 mg Oral BID  . atorvastatin  80 mg Oral q1800  . Chlorhexidine Gluconate Cloth  6 each Topical Q0600  . darbepoetin (ARANESP) injection - DIALYSIS  100 mcg Intravenous Q Mon-HD  . docusate sodium  100 mg Oral BID  . heparin injection (subcutaneous)  5,000 Units Subcutaneous Q8H  . lactobacillus acidophilus  1 tablet Oral TID  . mouth rinse  15 mL Mouth Rinse BID  . melatonin  5 mg Oral QHS  . metoprolol tartrate  37.5 mg Oral BID  . midodrine  5 mg Oral TID WC  . multivitamin  1 tablet Oral QHS  . nutrition supplement (JUVEN)  1 packet Oral BID BM  . pantoprazole  40 mg Oral BID  .  polyethylene glycol  17 g Oral Daily  . QUEtiapine  75 mg Oral QHS  . sevelamer carbonate  1.6 g Oral TID WC  . sodium chloride flush  3 mL Intravenous Q12H  . ticagrelor  90 mg Oral BID   Continuous Infusions: . sodium chloride 250 mL (11/27/20 0902)  . sodium chloride    .  ceFAZolin (ANCEF) IV Stopped (12/11/20 1206)     LOS: 39 days   Geradine Girt  [facility]   How to contact the [facility] Attending or Consulting provider [place] or covering provider during after hours [place], for this patient?  1. Check the care team in [facility] and look for a) attending/consulting [facility] provider listed and b) the [facility] team listed 2. Log into [email or web address] and use [place]'s universal password to access. If you do not have the password, please contact the hospital operator. 3. Locate the [facility] provider you are looking for under [facility] and page to a number that you can be directly reached. 4. If you still have difficulty reaching the provider, please page the [facility] (Director on Call) for the Hospitalists listed on amion for assistance.  12/12/2020, 12:33 PM

## 2020-12-12 NOTE — Progress Notes (Signed)
Physical Therapy Treatment Patient Details Name: Travis [name] MRN: [id number] DOB: [birth year] Today's Date: 12/12/2020    History of Present Illness Pt is 60 y.o. male with past medical history of end-stage renal disease on hemodialysis, essential hypertension, dilated ascending aorta and hyperlipidemia who presented on November 20 with anterior ST elevation myocardial infarction.  VDRF 11/23-12/3.  Stent placed 11/20.  Afib with RVR with cardioversion 11/21. Suspected cholecystolithiasis 11/29. Respiratory failure and reintubated 11/26/20.    PT Comments    Pt continues to be limited in safe mobility by sacral wound pain, however pt is making good progress towards his goals today. Pt currently only requires min guard assist for safety with bed mobility, transfers, standing balance of 3 minutes and ambulation of 250 feet with RW. Pt reports his wife is able to stay with him 24/7 and he would really like to discharge home. Given pt current level of function PT changing recommendation to HHPT with 24/7 assist. Pt will need a Rojo cushion for discharge to be able to perform proper weight shifting with 4 hour dialysis 3x weekly. PT will continue to follow acutely.    Follow Up Recommendations  Home health PT;Supervision/Assistance - 24 hour     Equipment Recommendations  Rolling walker with 5" wheels (Rojo cushion for sacral weight redistribution)       Precautions / Restrictions Precautions Precautions: Fall Precaution Comments: O2 Restrictions Weight Bearing Restrictions: No Other Position/Activity Restrictions: watch BP and O2    Mobility  Bed Mobility Overal bed mobility: Needs Assistance Bed Mobility: Rolling;Sidelying to Sit Rolling: Min guard         General bed mobility comments: min guard for safety, on side on entry and able to push up to sitting with no assist, once in sitting adjusts weightbearing on sacrum accordingly to decreased pain from sacral  wound  Transfers Overall transfer level: Needs assistance Equipment used: Rolling walker (2 wheeled) Transfers: Sit to/from Stand Sit to Stand: Min guard         General transfer comment: min guard for safety, good hand placement on bed surface for power up to standing, good self steady with RW  Ambulation/Gait Ambulation/Gait assistance: +2 safety/equipment;Min guard Gait Distance (Feet): 250 Feet Assistive device: Rolling walker (2 wheeled) Gait Pattern/deviations: Decreased stride length;Step-through pattern Gait velocity: decreased Gait velocity interpretation: <1.8 ft/sec, indicate of risk for recurrent falls General Gait Details: min guard for safety, +2 for equipment management          Balance Overall balance assessment: Needs assistance Sitting-balance support: No upper extremity supported;Feet supported Sitting balance-Leahy Scale: Fair   Postural control:  (able to sit EoB and manage balance while also managing pressure on sacrum) Standing balance support: Bilateral upper extremity supported Standing balance-Leahy Scale: Fair Standing balance comment: able to static stand and wash face without outside support                            Cognition Arousal/Alertness: Awake/alert Behavior During Therapy: WFL for tasks assessed/performed Overall Cognitive Status: Within Functional Limits for tasks assessed                         Following Commands: Follows multi-step commands consistently                 General Comments General comments (skin integrity, edema, etc.): On RA pt 99%O2 at rest with ambulation unable to achieve good pleth  wave form dropping to mid 70s, quickly rebounding with sensor adjustment, replaced sensor with similar results, pt asymptomatic      Pertinent Vitals/Pain Pain Assessment: Faces Faces Pain Scale: Hurts little more Pain Location: sacral wound Pain Descriptors / Indicators: Discomfort;Sore Pain  Intervention(s): Monitored during session;Limited activity within patient's tolerance;Repositioned           PT Goals (current goals can now be found in the care plan section) Acute Rehab PT Goals Patient Stated Goal: Move better and get stronger PT Goal Formulation: With patient/family Time For Goal Achievement: 12/18/20 Potential to Achieve Goals: Good Progress towards PT goals: Progressing toward goals    Frequency    Min 3X/week      PT Plan Discharge plan needs to be updated    Co-evaluation PT/OT/SLP Co-Evaluation/Treatment: Yes Reason for Co-Treatment: Complexity of the patient's impairments (multi-system involvement) PT goals addressed during session: Mobility/safety with mobility        AM-PAC PT "6 Clicks" Mobility   Outcome Measure  Help needed turning from your back to your side while in a flat bed without using bedrails?: None Help needed moving from lying on your back to sitting on the side of a flat bed without using bedrails?: A Little Help needed moving to and from a bed to a chair (including a wheelchair)?: A Little Help needed standing up from a chair using your arms (e.g., wheelchair or bedside chair)?: A Little Help needed to walk in hospital room?: A Little Help needed climbing 3-5 steps with a railing? : A Lot 6 Click Score: 18    End of Session Equipment Utilized During Treatment: Gait belt Activity Tolerance: Patient tolerated treatment well Patient left: with call bell/phone within reach;in chair Nurse Communication: Mobility status PT Visit Diagnosis: Muscle weakness (generalized) (M62.81)     Time: 1497-0263 PT Time Calculation (min) (ACUTE ONLY): 38 min  Charges:  $Gait Training: 8-22 mins                     Cella Cappello B. Migdalia Dk PT, DPT Acute Rehabilitation Services Pager [phone] Office [phone]    [place] 12/12/2020, 1:14 PM

## 2020-12-12 NOTE — Progress Notes (Signed)
Nutrition Follow-up  DOCUMENTATION CODES:   Severe malnutrition in context of chronic illness  INTERVENTION:   - Continue ProSource Plus 30 ml QID, each supplement provides 100 kcal and 15 grams of protein  - Continue 1 packet Juven BID, each packet provides 95 calories, 2.5 grams of protein, and 9.8 grams of carbohydrate; also contains 7 grams of L-arginine and L-glutamine, 300 mg vitamin C, 15 mg vitamin E, 1.2 mcg vitamin B-12, 9.5 mg zinc, 200 mg calcium, and 1.5 g calcium Beta-hydroxy-Beta-methylbutyrate to support wound healing  - Continue renal MVI daily  - Double portions with meals  NUTRITION DIAGNOSIS:   Severe Malnutrition related to chronic illness as evidenced by severe muscle depletion,severe fat depletion.  Ongoing, being addressed via oral nutrition supplements  GOAL:   Patient will meet greater than or equal to 90% of their needs  Progressing  MONITOR:   Diet advancement,TF tolerance,Skin,Labs  REASON FOR ASSESSMENT:   Consult Enteral/tube feeding initiation and management  ASSESSMENT:   Patient with PMH significant for HTN and ESRD in HD. Presents this admission with STEMI.  11/20 - PCI to LAD 11/23- intubated 11/24 - OG tube in esophagus, unable to advance 11/26 - Cortrak attempted but unable to advance past 55 cm, likely at GE junction, DR advanced to duodenal bulb, TF initiated 11/30 - brown malodorous output from mouth and OG, TF held, CT negative for obstruction, ileus with no BM x 10 days, abd xray with OG tube GE junction and Cortrak now retraced back into stomach, both needing advancement 12/01 - iHD not tolerated, CRRT to resume, +large amount of liquid stool post enema, attempted Cortrak advancement butunsuccessful 12/02 - pt with large volume blood from OG tube, s/p EGD with noted severe esophagitis and erosions/ulcerations in duodenal bulb compatible with ischemia (probable ischemic duodenitis)  12/03 - extubated, attempted Cortrak  placement unsuccessful 12/09 - chest tube inserted, SLP advanced diet to dysphagia 3/nectar thick 12/14 - diet advanced to dysphagia 2/thins 12/19 - re-intubated 12/21 - Cortrak placed  12/22 - extubated 12/24 - diet advanced to dysphagia 3/thins 12/28 - transferred out of ICU 12/30 - Cortrak tube and nocturnal TF d/c  Last HD on 1/03 with 1322 ml net UF. Post HD weight was 74.8 kg.  Noted plan for pt to d/c to CIR when bed available.  Spoke with pt at bedside. Pt is very happy to have Cortrak NG tube removed. Pt feels like he is eating very well and could eat more. He is hungry and ready for lunch at time of RD visit.  Pt endorses consuming ProSource Plus and Juven, both of which he enjoys. Pt is interested in receiving additional food at meals. RD to order double portions.  Meal Completion: 75-100%  Medications reviewed and include: ProSource Plus 30 ml QID, colace, aranesp weekly, bacid TID, rena-vit, Juven, protonix, miralax, renvela, IV abx  Labs reviewed: sodium 134, phosphorus 5.6, hemoglobin 7.4  Diet Order:   Diet Order            Diet renal with fluid restriction Fluid restriction: Other (see comments); Room service appropriate? Yes; Fluid consistency: Thin  Diet effective now                 EDUCATION NEEDS:   No education needs have been identified at this time  Skin:  Skin Assessment: Skin Integrity Issues: Stage IV: bilateral buttocks Unstageable: bilateral buttocks Incisions: left arm  Last BM:  12/13/19 multiple type 7  Height:   Ht Readings  from Last 1 Encounters:  11/26/20 5\' 11"  (1.803 m)    Weight:   Wt Readings from Last 1 Encounters:  12/12/20 73.5 kg    BMI:  Body mass index is 22.6 kg/m.  Estimated Nutritional Needs:   Kcal:  2250-2500  Protein:  135-160 grams  Fluid:  1000 mL plus UOP    Gustavus Bryant, MS, RD, LDN Inpatient Clinical Dietitian Please see AMiON for contact information.

## 2020-12-12 NOTE — Progress Notes (Addendum)
Patient ID: Travis [name], male   DOB: May 07, 1961, 60 y.o.   MRN: [id number]  [facility] Progress Note   Assessment/ Plan:   1. Resolved Acute hypoxic respiratory failure: Like pulm edema, now on 2L [place] 2. AMS: waxes/wanes, uremic component, stable now, needs 4h HD  3. Afib / RVR - getting po metoprolol and amio po. Stable in NSR 4. ESRD: on HD MWF schedule. SP CRRT course x 2. Heparin not being used due to significant ABLA/GI bleed recently.  RX here: 4h, added K, AVF, Qb 450, no heparin,   5. Volume/BP: weights re down, BPs stable on midodrine 5 mg tid for now.  6. Anemia: Multifactorial with acute illness and now likely complicated by malnutrition/inflammatory complex with sacral decubitus. Hb's still low in 8 range, ^ esa 60 > 100 ug q Monday, w/ as needed iron.  Recently worsened GI bleed secondary to duodenal ulcers and esophagitis.   7. CKD-MBD: Calcium and phosphorus level currently stable, Sevelamer qAC 8. Nutrition: per primary, push protein 9. History of acute STEMI: admit diagnosis, sp PCI to LAD on 10/2019.  On Brilinta due to multifocal/stable disease. 10. Sacral decubitus (unstageable): Wound care with hydrotherapy and pressure alleviating devices. Maintain optimization of nutritional status to help wound healing. PT/OT, CIR, 11. Bilat MSSA cavitary PNA w/ empyema: rx'd w/ abx and chest tubes.   Rexene Agent , MD 12/12/2020, 9:27 AM    Subjective:     Seen in SDU. Doing well, stanidng on own  Motivated, good spirits  Tol HD yesterday 1.3L UF    Objective:   BP 105/70   Pulse 88   Temp 98 F (36.7 C) (Oral)   Resp (!) 22   Ht 5\' 11"  (1.803 m)   Wt 73.5 kg   SpO2 98%   BMI 22.60 kg/m   Physical Exam: Gen: Awake, alert CVS: RRR no RG Resp: lungs clear bilat no rales Abd: Soft, nondistended, bowel sounds normal Ext: no edema LUA AVF+bruit  OP HD: AF MWF   4h  450/800  81.5kg  2/2.25 bath  RUE AVF  Hep none, on hold for inpt GI  bleeding issues (was on 5000u bolus+ 1000 midrun)   - hect 5 ug tiw   -  mircera 50 ug q 4, last 11/15 (due 11/29)  CXR 12/31 - IMPRESSION: 1. Improved aeration of both lungs compared to yesterday's chest x-ray. Probable small bilateral pleural effusions. 2. Stable cardiomegaly.   Labs: BMET Recent Labs  Lab 12/06/20 0428 12/08/20 0516 12/09/20 0103 12/11/20 0617  NA 132* 135 137 134*  K 4.2 3.9 3.6 3.5  CL 93* 95* 94* 97*  CO2 24 26 29 26   GLUCOSE 98 107* 111* 88  BUN 101* 89* 38* 72*  CREATININE 7.94* 8.32* 5.30* 7.16*  CALCIUM 10.3 10.5* 10.3 10.1  PHOS 5.6* 5.2*  --  5.6*   CBC Recent Labs  Lab 12/08/20 0516 12/09/20 0103 12/10/20 0101 12/11/20 0617  WBC 11.7* 14.8* 12.8* 12.3*  HGB 8.0* 8.3* 7.8* 7.4*  HCT 24.8* 27.2* 26.1* 24.3*  MCV 90.8 91.3 92.6 92.0  PLT 353 387 330 321      Medications:    . (feeding supplement) PROSource Plus  30 mL Oral TID PC & HS  . amiodarone  200 mg Oral BID  . atorvastatin  80 mg Oral q1800  . Chlorhexidine Gluconate Cloth  6 each Topical Q0600  . darbepoetin (ARANESP) injection - DIALYSIS  100 mcg Intravenous Q  Mon-HD  . docusate sodium  100 mg Oral BID  . heparin injection (subcutaneous)  5,000 Units Subcutaneous Q8H  . lactobacillus acidophilus  1 tablet Oral TID  . mouth rinse  15 mL Mouth Rinse BID  . melatonin  5 mg Oral QHS  . metoprolol tartrate  37.5 mg Oral BID  . midodrine  5 mg Oral TID WC  . multivitamin  1 tablet Oral QHS  . nutrition supplement (JUVEN)  1 packet Oral BID BM  . pantoprazole  40 mg Oral BID  . polyethylene glycol  17 g Oral Daily  . QUEtiapine  75 mg Oral QHS  . sevelamer carbonate  1.6 g Oral TID WC  . sodium chloride flush  3 mL Intravenous Q12H  . ticagrelor  90 mg Oral BID

## 2020-12-12 NOTE — Progress Notes (Signed)
Inpatient Rehab Admissions Coordinator:   Met with  Pt. To discuss CIR admit. He was sitting up in recliner and expressed continued interest. I do not have a bed available for Pt. Today. AC team to follow for tolerance with therapies and follow for potential admit this week.   Clemens Catholic, [place], [place] Admissions Coordinator  [phone] ([place]) [phone] (office)

## 2020-12-12 NOTE — Progress Notes (Signed)
Pt refused cpap for the night.  

## 2020-12-12 NOTE — Progress Notes (Addendum)
Occupational Therapy Treatment Patient Details Name: Travis [name] MRN: [id number] DOB: [birth year] Today's Date: 12/12/2020    History of present illness Pt is 60 y.o. male with past medical history of end-stage renal disease on hemodialysis, essential hypertension, dilated ascending aorta and hyperlipidemia who presented on November 20 with anterior ST elevation myocardial infarction.  VDRF 11/23-12/3.  Stent placed 11/20.  Afib with RVR with cardioversion 11/21. Suspected cholecystolithiasis 11/29. Respiratory failure and reintubated 11/26/20.   OT comments  Pt making good progress with functional goals. Cognition has significantly improved. Pt min gaurd A with mobility using RW and standing at sink for UB and LB bathing min guard A. OT changing recommendation to [facility] with 24/7 initially for sup and assist (pt's wife can provide this). OT will continue to follow acutely to maximize level of function and safety  Follow Up Recommendations  Supervision/Assistance - 24 hour;Home health OT    Equipment Recommendations  3 in 1 bedside commode;Wheelchair (measurements OT);Wheelchair cushion (measurements OT), Manufacturing systems engineer for [employer]      Precautions / Restrictions Precautions Precautions: Fall Precaution Comments: O2 Restrictions Weight Bearing Restrictions: No Other Position/Activity Restrictions: watch BP and O2       Mobility Bed Mobility Overal bed mobility: Needs Assistance Bed Mobility: Rolling;Sidelying to Sit Rolling: Min guard         General bed mobility comments: min guard for safety, on side on entry and able to push up to sitting with no assist, once in sitting adjusts weightbearing on sacrum accordingly to decreased pain from sacral wound  Transfers Overall transfer level: Needs assistance Equipment used: Rolling walker (2 wheeled) Transfers: Sit to/from Stand Sit to Stand: Min guard         General transfer comment: min guard for  safety, good hand placement on bed surface for power up to standing, good self steady with RW    Balance Overall balance assessment: Needs assistance Sitting-balance support: No upper extremity supported;Feet supported Sitting balance-Leahy Scale: Fair   Postural control:  (able to sit EoB and manage balance while also managing pressure on sacrum) Standing balance support: Bilateral upper extremity supported Standing balance-Leahy Scale: Fair Standing balance comment: able to static stand and wash face without outside support                           ADL either performed or assessed with clinical judgement   ADL Overall ADL's : Needs assistance/impaired     Grooming: Wash/dry hands;Min guard;Standing   Upper Body Bathing: Min guard;Standing   Lower Body Bathing: Min guard;Sit to/from stand   Upper Body Dressing : Min guard;Standing   Lower Body Dressing: Sit to/from stand;Minimal assistance   Toilet Transfer: Min guard;Ambulation;RW;Cueing for safety   Toileting- Clothing Manipulation and Hygiene: Min guard;Sit to/from stand       Functional mobility during ADLs: Min guard;Rolling walker       Vision Patient Visual Report: No change from baseline     Perception     Praxis      Cognition Arousal/Alertness: Awake/alert Behavior During Therapy: WFL for tasks assessed/performed Overall Cognitive Status: Within Functional Limits for tasks assessed                         Following Commands: Follows multi-step commands consistently                Exercises  Shoulder Instructions       General Comments On RA pt 99%O2 at rest with ambulation unable to achieve good pleth wave form dropping to mid 70s, quickly rebounding with sensor adjustment, replaced sensor with similar results, pt asymptomatic    Pertinent Vitals/ Pain       Pain Assessment: Faces Faces Pain Scale: Hurts little more Pain Location: sacral wound Pain Descriptors  / Indicators: Discomfort;Sore Pain Intervention(s): Limited activity within patient's tolerance;Monitored during session;Repositioned  Home Living                                          Prior Functioning/Environment              Frequency  Min 2X/week        Progress Toward Goals  OT Goals(current goals can now be found in the care plan section)  Progress towards OT goals: Progressing toward goals  Acute Rehab OT Goals Patient Stated Goal: Move better and get stronger  Plan Discharge plan needs to be updated    Co-evaluation    PT/OT/SLP Co-Evaluation/Treatment: Yes Reason for Co-Treatment: Complexity of the patient's impairments (multi-system involvement);Necessary to address cognition/behavior during functional activity   OT goals addressed during session: ADL's and self-care;Proper use of Adaptive equipment and DME      AM-PAC OT "6 Clicks" Daily Activity     Outcome Measure   Help from another person eating meals?: None Help from another person taking care of personal grooming?: A Little Help from another person toileting, which includes using toliet, bedpan, or urinal?: A Little Help from another person bathing (including washing, rinsing, drying)?: A Little Help from another person to put on and taking off regular upper body clothing?: A Little Help from another person to put on and taking off regular lower body clothing?: A Little 6 Click Score: 19    End of Session Equipment Utilized During Treatment: Rolling walker;Oxygen  OT Visit Diagnosis: Unsteadiness on feet (R26.81);Other abnormalities of gait and mobility (R26.89);Muscle weakness (generalized) (M62.81);Pain Pain - part of body:  (buttocks/sacrum)   Activity Tolerance Patient tolerated treatment well   Patient Left with call bell/phone within reach;in chair   Nurse Communication Mobility status        Time: 9562-1308 OT Time Calculation (min): 39 min  Charges: OT  General Charges $OT Visit: 1 Visit OT Treatments $Self Care/Home Management : 8-22 mins $Therapeutic Activity: 8-22 mins     Britt Bottom 12/12/2020, 2:40 PM

## 2020-12-12 NOTE — Progress Notes (Signed)
CARDIAC REHAB PHASE I   PRE:  Rate/Rhythm: 4 SR with PVC occ    BP: lying 100/65    SaO2: 95 RA  MODE:  Ambulation: 250 ft   POST:  Rate/Rhythm: 96 SR    BP: sitting 108/73     SaO2: 93-97 RA  Pt in bed, eager to ambulate. To EOB and stood with standby assist and ambulated hall with standby assist with gait belt, RW. Pt denied c/o. To recliner after walk, VSS. Encouraged IS. Discussed MI, stent, Brilinta, exercise and CRPII. Pt receptive and is eager to progress. Will refer to [place] (pt is willing to do program before he goes to HD on MWF). Will f/u for more education if pt is for d/c home. Defer diet to RD (renal). [place], ACSM 12/12/2020 2:18 PM

## 2020-12-12 NOTE — H&P (Incomplete)
Physical Medicine and Rehabilitation Admission H&P    Chief Complaint  Patient presents with  . Code STEMI  : HPI: Travis [name] is a 60 year old right-handed male with history of hypertension, dilated ascending aorta 4.5 cm, end-stage renal disease with hemodialysis , quit smoking 7 years ago.  Per chart review patient lives with spouse.  One level home one-step to entry.  Independent bathing and dressing.  Ambulated without assistive device.  Presented 10/28/2020 with acute onset of chest pressure initially rated 7-10 radiating to his back.  EKG showed subtle ST elevations in lead I with more notable ST elevations in aVL and hyperacute T waves with subtle ST elevations in V2-V3 and V4.  Code STEMI was called.  In the ED he did receive aspirin as well as heparin.  Patient underwent cardiac catheterization/PCI of the LAD per Dr. Quentin Ore.  Echocardiogram showed ejection fraction of 35 to 25% grade 3 diastolic dysfunction.  Patient is currently maintained on Brilinta.  Hospital course complicated by atrial fibrillation RVR requiring cardioversion x2 and amiodarone was added, septic shock due to MSSA bacteremia that received follow-up consult by infectious disease presently maintained on cefazolin for a total of 6 weeks, brief PEA arrest requiring CPR, acute hypoxic respiratory failure requiring intubation.  Patient developed upper GI bleed with acute blood loss anemia due to severe esophagitis requiring packed red blood cell transfusion follow-up per gastroenterology Dr. Watt Climes.  Bouts of delirium agitation with MRI 11/06/2020 completed showing no acute abnormality.  He is currently maintained on Seroquel for mood stabilization.  Developed bilateral lung abscesses with empyema requiring chest tube insertion followed by pulmonary services stabilized in the ICU and chest tube has since been removed.  He did require reintubation for a short time through 11/26/2021.WOC consulted for follow-up of stage IV  sacral wound with dressing changes as directed.  Acute on chronic anemia 7.4 with Aranesp as directed.  Blood pressures monitored closely he has been placed on ProAmatine 5 mg 3 times daily.  His diet has been advanced to regular consistency.  Due to patient's decrease in functional mobility he was admitted for a comprehensive rehabilitation program.  Review of Systems  Constitutional: Negative for chills and fever.  HENT: Negative for hearing loss.   Eyes: Negative for blurred vision and double vision.  Respiratory: Positive for shortness of breath. Negative for cough.   Cardiovascular: Positive for chest pain and leg swelling.  Gastrointestinal: Positive for constipation. Negative for heartburn, nausea and vomiting.       GERD  Genitourinary: Negative for dysuria, flank pain and hematuria.  Musculoskeletal: Positive for myalgias.  Skin: Negative for rash.  All other systems reviewed and are negative.  Past Medical History:  Diagnosis Date  . Acute ST elevation myocardial infarction (STEMI) of anterolateral wall ([place]) 10/28/2020   Culprit lesion, 99% ulcerated mid LAD at D1 -> DES PCI  . Anemia of chronic disease 01/30/2015  . Coronary artery disease involving native coronary artery of native heart with unstable angina pectoris ([place]) 10/28/2020   Acute anterolateral STEMI: 3V CAD: Culprit Lesion = 99% mid LAD lesion (just after D1) - DES PCI; 99% ostial AV groove LCx; & 80% calcified napkin ring proximal RCA lesion with extensive calcification throughout the RCA. Successful PTCA and DES PCI of the LAD crossing D1 -resolute Onyx DES 2.75 mm x 18 mm postdilated to 3.1 mm. With PCI, there  . End-stage renal disease on hemodialysis ([place])   . GERD (gastroesophageal reflux disease)  takes Nexium  . Hyperlipidemia with target LDL less than 70 10/28/2020  . Hypertension   . Pneumonia    as a child  . Presence of drug coated stent in LAD coronary artery 10/28/2020   Proximal-mid LAD 99% ->0%  -> Resolute Onyx DES 2.75 mm x 18 mm (3.1 mm)  . Sleep apnea    uses c-pap 2 yrs   Past Surgical History:  Procedure Laterality Date  . AV FISTULA PLACEMENT Left 12/22/2017   Procedure: REPAIR PSEUDOANEURYSM ARTERIOVENOUS (AV) FISTULA;  Surgeon: [name], MD;  Location: [place];  Service: Vascular;  Laterality: Left;  . BASCILIC VEIN TRANSPOSITION Left 01/05/2014   Procedure: LEFT 1ST STAGE BASCILIC VEIN TRANSPOSITION;  Surgeon: Conrad [place], MD;  Location: [place];  Service: Vascular;  Laterality: Left;  . BASCILIC VEIN TRANSPOSITION Left 07/26/2014   Procedure: Left Arm Brachial Vein Transposition Second Stage;  Surgeon: Conrad [place]andoah, MD;  Location: [place];  Service: Vascular;  Laterality: Left;  . COLONOSCOPY    . COLONOSCOPY N/A 02/02/2015   Procedure: COLONOSCOPY;  Surgeon: [name], MD;  Location: [facility] ENDOSCOPY;  Service: Endoscopy;  Laterality: N/A;  . CORONARY STENT INTERVENTION N/A 10/28/2020   Procedure: CORONARY STENT INTERVENTION;  Surgeon: Leonie Man, MD;  Location: [place] CV LAB;  Service: Cardiovascular;  Laterality: N/A;  . CORONARY/GRAFT ACUTE MI REVASCULARIZATION N/A 10/28/2020   Procedure: Coronary/Graft Acute MI Revascularization;  Surgeon: Leonie Man, MD;  Location: [place] CV LAB;  Service: Cardiovascular;  Laterality: N/A;  . ESOPHAGOGASTRODUODENOSCOPY Left 01/31/2015   Procedure: ESOPHAGOGASTRODUODENOSCOPY (EGD);  Surgeon: [name], MD;  Location: [facility] ENDOSCOPY;  Service: Endoscopy;  Laterality: Left;  . ESOPHAGOGASTRODUODENOSCOPY N/A 11/09/2020   Procedure: ESOPHAGOGASTRODUODENOSCOPY (EGD);  Surgeon: Clarene Essex, MD;  Location: [place];  Service: Endoscopy;  Laterality: N/A;  . EVALUATION UNDER ANESTHESIA WITH HEMORRHOIDECTOMY N/A 02/26/2018   Procedure: ANORECTAL EXAM UNDER ANESTHESIA  HEMORRHOIDECTOMY x 2  HEMORRHOIDAL LIGATION AND PEXY;  Surgeon: Henley Boston, MD;  Location: [facility];  Service: General;  Laterality: N/A;  . GIVENS  CAPSULE STUDY N/A 01/31/2015   Procedure: GIVENS CAPSULE STUDY;  Surgeon: [name], MD;  Location: [facility] ENDOSCOPY;  Service: Endoscopy;  Laterality: N/A;  . IR AV DIALY SHUNT INTRO NEEDLE/INTRAC INITIAL W/PTA/STENT/IMG LT Left 11/20/2020  . IR US GUIDE VASC ACCESS LEFT  11/20/2020  . LEFT HEART CATH AND CORONARY ANGIOGRAPHY N/A 10/28/2020   Procedure: LEFT HEART CATH AND CORONARY ANGIOGRAPHY;  Surgeon: Leonie Man, MD;  Location: [place] CV LAB;  Service: Cardiovascular;  Laterality: N/A;  . MOUTH SURGERY     teeth cleaning  . NEPHRECTOMY RADICAL Right 05/2015   [facility] Dr [name] for R renal mass   Family History  Problem Relation Age of Onset  . Hypertension Mother   . Hypertension Father   . Hypertension Sister    Social History:  reports that he quit smoking about 7 years ago. His smoking use included cigars. He quit after 4.00 years of use. He has never used smokeless tobacco. He reports that he does not drink alcohol and does not use drugs. Allergies:  Allergies  Allergen Reactions  . Ibuprofen Hives  . Lisinopril Swelling    PT states he is allergic to all prils; caused facial swelling  . Naproxen Hives and Other (See Comments)    Alleve causes patient to have hives   Medications Prior to Admission  Medication Sig Dispense Refill  . acetaminophen (TYLENOL) 325 MG  tablet Take 650 mg by mouth every 6 (six) hours as needed for mild pain, fever or headache.    Lorin Picket 1 GM 210 MG(Fe) tablet Take 210-630 mg by mouth See admin instructions. 630mg  three times daily with meals and 210-420mg  with snacks  11  . cinacalcet (SENSIPAR) 60 MG tablet Take 120 mg by mouth daily.    . multivitamin (RENA-VIT) TABS tablet Take 1 tablet by mouth daily.    . Vitamin D, Ergocalciferol, (DRISDOL) 1.25 MG (50000 UNIT) CAPS capsule Take 50,000 Units by mouth once a week.    . iron polysaccharides (NIFEREX) 150 MG capsule Take 1 capsule (150 mg total) by mouth 2 (two) times daily. (Patient  not taking: Reported on 10/28/2020) 60 capsule 1  . labetalol (NORMODYNE) 300 MG tablet Take 1 tablet (300 mg total) by mouth 2 (two) times daily. (Patient not taking: Reported on 10/28/2020) 60 tablet 1    Drug Regimen Review Drug regimen was reviewed and remains appropriate with no significant issues identified  Home: Home Living Family/patient expects to be discharged to:: Private residence Living Arrangements: Spouse/significant other Available Help at Discharge: Family,Available 24 hours/day Type of Home: House Home Access: Stairs to enter [employer] of Steps: 1 Entrance Stairs-Rails: None Home Layout: One level Bathroom Shower/Tub: Chiropodist: Handicapped height Home Equipment: None   Functional History: Prior Function Level of Independence: Needs assistance Gait / Transfers Assistance Needed: Per wife, pt walked PTA without device ADL's / [place]: Independent with ADLs per wife  Functional Status:  Mobility: Bed Mobility Overal bed mobility: Needs Assistance Bed Mobility: Rolling,Sidelying to Sit Rolling: Min guard Sidelying to sit: Min assist Supine to sit: Mod assist,+2 for physical assistance Sit to sidelying: +2 for safety/equipment,Min assist General bed mobility comments: min guard for safety, on side on entry and able to push up to sitting with no assist, once in sitting adjusts weightbearing on sacrum accordingly to decreased pain from sacral wound Transfers Overall transfer level: Needs assistance Equipment used: Rolling walker (2 wheeled) Transfer via Lift Equipment: [employer] Transfers: Sit to/from Stand Sit to Stand: [employer] transfer comment: min guard for safety, good hand placement on bed surface for power up to standing, good self steady with RW Ambulation/Gait Ambulation/Gait assistance: +2 safety/equipment,Min guard Gait Distance (Feet): 250 Feet Assistive device: Rolling walker (2  wheeled) Gait Pattern/deviations: Decreased stride length,Step-through pattern General Gait Details: min guard for safety, +2 for equipment management Gait velocity: decreased Gait velocity interpretation: <1.8 ft/sec, indicate of risk for recurrent falls    ADL: ADL Overall ADL's : Needs assistance/impaired Eating/Feeding: Set up,Sitting Eating/Feeding Details (indicate cue type and reason): assisted to open containers Grooming: Wash/dry hands,Sitting,Set up,Supervision/safety (blowing nose) Grooming Details (indicate cue type and reason): Max hand over hand Upper Body Bathing: Maximal assistance,Bed level,Sitting Lower Body Bathing: Total assistance,Bed level Upper Body Dressing : Maximal assistance,Sitting,Bed level Lower Body Dressing: Total assistance,Bed level Toilet Transfer: Minimal assistance,+2 for safety/equipment,+2 for physical assistance (simulated in room) Toilet Transfer Details (indicate cue type and reason): Min A +2 for sit<>stand for elevated surface Functional mobility during ADLs: Minimal assistance,+2 for physical assistance,+2 for safety/equipment (sit<>stand) General ADL Comments: Pt highly focused on eating his entire plate of food.  Cognition: Cognition Overall Cognitive Status: Within Functional Limits for tasks assessed Orientation Level: Oriented X4 Cognition Arousal/Alertness: Awake/alert Behavior During Therapy: WFL for tasks assessed/performed Overall Cognitive Status: Within Functional Limits for tasks assessed Area of Impairment: Memory,Following commands,Safety/judgement,Awareness,Problem solving,Attention Orientation Level: Disoriented  to,Time,Situation,Place Current Attention Level: Selective Memory: Decreased short-term memory Following Commands: Follows multi-step commands consistently Safety/Judgement: Decreased awareness of safety,Decreased awareness of deficits Awareness: Intellectual,Emergent Problem Solving: Difficulty  sequencing,Requires verbal cues,Requires tactile cues,Decreased initiation,Slow processing General Comments: Pt internally distracted with tangential conversation. Verbose. Difficult to assess due to: Intubated  Physical Exam: Blood pressure 108/73, pulse 86, temperature 98 F (36.7 C), temperature source Oral, resp. rate (!) 23, height 5\' 11"  (1.803 m), weight 73.5 kg, SpO2 97 %. Physical Exam Neurological:     Comments: Patient is alert makes good eye contact with examiner.  Oriented x3 and follows commands.  He could not recall full events of his hospital stay.     Results for orders placed or performed during the hospital encounter of 10/28/20 (from the past 48 hour(s))  Glucose, capillary     Status: None   Collection Time: 12/10/20  4:02 PM  Result Value Ref Range   Glucose-Capillary 91 70 - 99 mg/dL    Comment: Glucose reference range applies only to samples taken after fasting for at least 8 hours.  Renal function panel     Status: Abnormal   Collection Time: 12/11/20  6:17 AM  Result Value Ref Range   Sodium 134 (L) 135 - 145 mmol/L   Potassium 3.5 3.5 - 5.1 mmol/L   Chloride 97 (L) 98 - 111 mmol/L   CO2 26 22 - 32 mmol/L   Glucose, Bld 88 70 - 99 mg/dL    Comment: Glucose reference range applies only to samples taken after fasting for at least 8 hours.   BUN 72 (H) 6 - 20 mg/dL   Creatinine, Ser 7.16 (H) 0.61 - 1.24 mg/dL   Calcium 10.1 8.9 - 10.3 mg/dL   Phosphorus 5.6 (H) 2.5 - 4.6 mg/dL   Albumin 2.0 (L) 3.5 - 5.0 g/dL   GFR, Estimated 8 (L) >60 mL/min    Comment: (NOTE) Calculated using the CKD-EPI Creatinine Equation (2021)    Anion gap 11 5 - 15    Comment: Performed at [facility] [address], [place], [place] [address]  CBC     Status: Abnormal   Collection Time: 12/11/20  6:17 AM  Result Value Ref Range   WBC 12.3 (H) 4.0 - 10.5 K/uL   RBC 2.64 (L) 4.22 - 5.81 MIL/uL   Hemoglobin 7.4 (L) 13.0 - 17.0 g/dL   HCT 24.3 (L) 39.0 - 52.0 %   MCV  92.0 80.0 - 100.0 fL   MCH 28.0 26.0 - 34.0 pg   MCHC 30.5 30.0 - 36.0 g/dL   RDW 13.8 11.5 - 15.5 %   Platelets 321 150 - 400 K/uL   nRBC 0.0 0.0 - 0.2 %    Comment: Performed at [facility], [place] [address] [place], [place] [address]  MRSA PCR Screening     Status: None   Collection Time: 12/11/20  3:48 PM   Specimen: Nasal Mucosa; Nasopharyngeal  Result Value Ref Range   MRSA by PCR NEGATIVE NEGATIVE    Comment:        The GeneXpert MRSA Assay (FDA approved for NASAL specimens only), is one component of a comprehensive MRSA colonization surveillance program. It is not intended to diagnose MRSA infection nor to guide or monitor treatment for MRSA infections. Performed at [facility], [place] [address] [place], [place] [address]    No results found.     Medical Problem List and Plan: 1.  Debility  secondary to acute ST elevation myocardial infarction/septic shock /PEA arrest //atrial fibrillation with RVR /multimedical  -patient may *** shower  -ELOS/Goals: *** 2.  Antithrombotics: -DVT/anticoagulation: Subcutaneous heparin  -antiplatelet therapy: Brilinta 3. Pain Management: Oxycodone as needed 4. Mood: Provide emotional support.  Melatonin 5 mg nightly  -antipsychotic agents: Seroquel 75 mg nightly 5. Neuropsych: This patient is capable of making decisions on his own behalf. 6. Skin/Wound Care: Stage IV pressure ulcer.  Follow-up wound care nurse dressing changes as directed 7. Fluids/Electrolytes/Nutrition: Routine in and outs with follow-up chemistries 8.  End-stage renal disease.  Hemodialysis directed .  MSSA bacteremia.  Complete course of cefazolin.  Follow-up infectious disease 9.  Upper GI bleed secondary to severe esophagitis.  Transfuse as directed.  Follow-up gastroenterology Dr. Watt Climes 10..  Bilateral lung abscesses with empyema.  Chest tube removed. 11.  Dilated ascending aorta 4.5 cm.  Follow-up outpatient 12..  Atrial fibrillation with RVR.   Amiodarone 200 mg twice daily, Lopressor 37.5 mg twice daily.  Follow cardiology services for management  13.  Orthostasis.  ProAmatine 5 mg 3 times daily 14.  Acute on chronic anemia.  Continue Aranesp 15.  Hyperlipidemia.  Lipitor Cathlyn Parsons, PA-C 12/12/2020

## 2020-12-12 NOTE — Plan of Care (Signed)
  Problem: Education: Goal: Understanding of CV disease, CV risk reduction, and recovery process will improve Outcome: Progressing   Problem: Activity: Goal: Ability to return to baseline activity level will improve Outcome: Progressing   Problem: Cardiovascular: Goal: Ability to achieve and maintain adequate cardiovascular perfusion will improve Outcome: Progressing   Problem: Health Behavior/Discharge Planning: Goal: Ability to safely manage health-related needs after discharge will improve Outcome: Progressing   Problem: Education: Goal: Knowledge of General Education information will improve Description: Including pain rating scale, medication(s)/side effects and non-pharmacologic comfort measures Outcome: Progressing   Problem: Health Behavior/Discharge Planning: Goal: Ability to manage health-related needs will improve Outcome: Progressing   Problem: Clinical Measurements: Goal: Ability to maintain clinical measurements within normal limits will improve Outcome: Progressing Goal: Will remain free from infection Outcome: Progressing Goal: Diagnostic test results will improve Outcome: Progressing Goal: Respiratory complications will improve Outcome: Progressing Goal: Cardiovascular complication will be avoided Outcome: Progressing   Problem: Activity: Goal: Risk for activity intolerance will decrease Outcome: Progressing   Problem: Nutrition: Goal: Adequate nutrition will be maintained Outcome: Progressing   Problem: Coping: Goal: Level of anxiety will decrease Outcome: Progressing   Problem: Elimination: Goal: Will not experience complications related to bowel motility Outcome: Progressing   Problem: Pain Managment: Goal: General experience of comfort will improve Outcome: Progressing   Problem: Safety: Goal: Ability to remain free from injury will improve Outcome: Progressing   Problem: Skin Integrity: Goal: Risk for impaired skin integrity will  decrease Outcome: Progressing

## 2020-12-13 DIAGNOSIS — I2109 ST elevation (STEMI) myocardial infarction involving other coronary artery of anterior wall: Secondary | ICD-10-CM | POA: Diagnosis not present

## 2020-12-13 LAB — CBC
HCT: 25.9 % — ABNORMAL LOW (ref 39.0–52.0)
Hemoglobin: 7.8 g/dL — ABNORMAL LOW (ref 13.0–17.0)
MCH: 28.3 pg (ref 26.0–34.0)
MCHC: 30.1 g/dL (ref 30.0–36.0)
MCV: 93.8 fL (ref 80.0–100.0)
Platelets: 339 10*3/uL (ref 150–400)
RBC: 2.76 MIL/uL — ABNORMAL LOW (ref 4.22–5.81)
RDW: 14.3 % (ref 11.5–15.5)
WBC: 11.8 10*3/uL — ABNORMAL HIGH (ref 4.0–10.5)
nRBC: 0 % (ref 0.0–0.2)

## 2020-12-13 LAB — RENAL FUNCTION PANEL
Albumin: 2.3 g/dL — ABNORMAL LOW (ref 3.5–5.0)
Anion gap: 12 (ref 5–15)
BUN: 71 mg/dL — ABNORMAL HIGH (ref 6–20)
CO2: 22 mmol/L (ref 22–32)
Calcium: 10.4 mg/dL — ABNORMAL HIGH (ref 8.9–10.3)
Chloride: 103 mmol/L (ref 98–111)
Creatinine, Ser: 7.27 mg/dL — ABNORMAL HIGH (ref 0.61–1.24)
GFR, Estimated: 8 mL/min — ABNORMAL LOW (ref >60)
Glucose, Bld: 108 mg/dL — ABNORMAL HIGH (ref 70–99)
Phosphorus: 4.3 mg/dL (ref 2.5–4.6)
Potassium: 3.8 mmol/L (ref 3.5–5.1)
Sodium: 137 mmol/L (ref 135–145)

## 2020-12-13 NOTE — Progress Notes (Signed)
.    Progress Note    Travis [name]  HUT:[id number] DOB: 04-27-1961  DOA: 10/28/2020 PCP: London Pepper, MD    Brief Narrative:     Medical records reviewed and are as summarized below:  Travis [name] is an 60 y.o. male with history of ESRD, HTN, CAD-who presented with anterior STEMI-treated with DES to LAD-hospital course prolonged and complicated by atrial fibrillation with RVR requiring cardioversion x2,, septic shock due to MSSA bacteremia, brief PEA arrest requiring CPR, acute hypoxic respiratory failure requiring intubation, upper GI bleed with acute blood loss anemia due to severe esophagitis requiring PRBC transfusion, severe metabolic encephalopathy/ICU delirium, bilateral lung abscesses with empyema requiring chest tube insertion--stabilized in the ICU-subsequently transferred to the [facility] on 12/12, his chest tube was dislodged on the floor, he started developing increasing right-sided pleural effusion/empyema, patient was transferred back to ICU and chest tube was placed.  He was transferred back to [facility] on 12/18.  Worsening respiratory failure due to volume overload and patient was re-intubated on 12/19.   Slowly improving and working to transition patient to rehab.      Assessment/Plan:   Principal Problem:   Acute ST elevation myocardial infarction (STEMI) of anterolateral wall (HCC) Active Problems:   ESRD (end stage renal disease) (HCC)   UGIB (upper gastrointestinal bleed)   Essential hypertension   Anemia due to GI blood loss   Hyperlipidemia with target LDL less than 70   3 V- CAD w/ ACS/STEMI: Culprit = 99% mLAD @ D1 (DES PCI jailing D1); 99% ost-AVGLCx, 80% calcified napkin ring prox RCA.    Presence of drug coated stent in LAD coronary artery: Resolute Onyx DES 2.75 mm x 18 mm (3.1 mm) at major D1 & SP1.   Acute combined systolic and diastolic heart failure (HCC)   Atrial fibrillation with RVR (HCC)   Bacteremia   Septic shock due to  Staphylococcus aureus [facility])   Cardiac arrest ([place])   Pneumonia of right lower lobe due to methicillin susceptible Staphylococcus aureus (MSSA) (HCC)   Severe mitral regurgitation   Acute myocardial infarction (HCC)   Acute ST elevation myocardial infarction (STEMI) involving left anterior descending (LAD) coronary artery (HCC)   Pressure injury of skin   Protein-calorie malnutrition, severe   Pleural effusion   Empyema (HCC)   Chest tube in place   PAF (paroxysmal atrial fibrillation) (HCC)    Delirium/AMS -resolved -suspect due to uremia as better after HD.  Stop Seroquel. -no clear sign of infection  OSA- history of -CPAP at night if wanted  Acute STEMI with LAD stent -on Brilinta. -cardiology consult appreciated: Will defer plan for RCA PCI for now until more stable particularly with recent GI bleed and need for anticoagulation for intervention.  - Associated ischemic cardiomyopathy.    paroxysmal atrial fibrillation  - anticoagulation  held given GI bleeding during this hospitalization -continue metoprolol and amiodarone and monitor.  MSSA bacteremia Per previous hospitalist note, plan was for to continue cefazolin for 6 weeks for some reasons not mentioned clearly anywhere.  I am trying to figure out more about this.  It appears that ID has never been consulted on this patient.  Last positive blood culture was from 11/07/2020.  Patient has had negative blood cultures since 12-21.  Right parapneumonic effusion, loculated but improved drainage after DNase/TPA -chest tube removed   Acute hypoxic respiratory failure: Resolved.  Now on 2 L oxygen.  We will try to wean to room air and  check ambulatory oximetry tomorrow.  End-stage renal disease.   -Dialysis as per nephrology  -STRICT FLUID RESTRICTION -has been tolerating close to 2 hours daily in a chair already  Pressure wound- sacral area, not present on admission Pressure Injury 11/14/20 Buttocks Mid;Right;Left  Unstageable - Full thickness tissue loss in which the base of the injury is covered by slough (yellow, tan, gray, green or brown) and/or eschar (tan, brown or black) in the wound bed. (Active)  11/14/20 1128  Location: Buttocks  Location Orientation: Mid;Right;Left  Staging: Unstageable - Full thickness tissue loss in which the base of the injury is covered by slough (yellow, tan, gray, green or brown) and/or eschar (tan, brown or black) in the wound bed.  Wound Description (Comments):   Present on Admission:   -wound care in process  Anemia of CD and ABLA (GI bleed and bleeding from wound) -s/p 14 units since admission.  Hemoglobin stable.  Hypercalcemia -resolved    Family Communication/Anticipated D/C date and plan/Code Status   DVT prophylaxis: heparin Code Status: Full Code.  Disposition Plan: Status is: Inpatient Spoke with dad at bedside Remains inpatient appropriate because:Ongoing diagnostic testing needed not appropriate for outpatient work up   Dispo: The patient is from: Home              Anticipated d/c is to: Home with home health.              Anticipated d/c date is: 1-2 days                       Medical Consultants:    ID  PCCM  Renal  Cards  IR  WOC     Subjective:   Patient seen and examined after dialysis.  He was sitting at the edge of the bed, going to start his lunch.  He had no complaint.  He was happy to know that he has progressed well enough that PT has now recommended that he can be discharged home with home health.  He would like to be discharged home in next 1 to 2 days.  Objective:    Vitals:   12/13/20 1145 12/13/20 1215 12/13/20 1232 12/13/20 1234  BP: 115/64 131/65 139/70 137/67  Pulse: 82 82 81 83  Resp:    (!) 37  Temp:    98.6 F (37 C)  TempSrc:    Oral  SpO2:   100% 100%  Weight:      Height:        Intake/Output Summary (Last 24 hours) at 12/13/2020 1355 Last data filed at 12/13/2020 0720 Gross per 24  hour  Intake 175 ml  Output --  Net 175 ml   Filed Weights   12/11/20 1256 12/12/20 0330 12/13/20 0646  Weight: 74.8 kg 73.5 kg 74 kg    Exam: General exam: Appears calm and comfortable  Respiratory system: Clear to auscultation. Respiratory effort normal. Cardiovascular system: S1 & S2 heard, RRR. No JVD, murmurs, rubs, gallops or clicks. No pedal edema. Gastrointestinal system: Abdomen is nondistended, soft and nontender. No organomegaly or masses felt. Normal bowel sounds heard. Central nervous system: Alert and oriented. No focal neurological deficits. Extremities: Symmetric 5 x 5 power. Skin: No rashes, lesions or ulcers.  Psychiatry: Judgement and insight appear normal. Mood & affect appropriate.    Data Reviewed:   I have personally reviewed following labs and imaging studies:  Labs: Labs show the following:   Basic Metabolic Panel: Recent Labs  Lab 12/08/20 0516 12/09/20 0103 12/11/20 0617 12/13/20 0842  NA 135 137 134* 137  K 3.9 3.6 3.5 3.8  CL 95* 94* 97* 103  CO2 26 29 26 22   GLUCOSE 107* 111* 88 108*  BUN 89* 38* 72* 71*  CREATININE 8.32* 5.30* 7.16* 7.27*  CALCIUM 10.5* 10.3 10.1 10.4*  PHOS 5.2*  --  5.6* 4.3   GFR Estimated Creatinine Clearance: 11.6 mL/min (A) (by C-G formula based on SCr of 7.27 mg/dL (H)). Liver Function Tests: Recent Labs  Lab 12/08/20 0516 12/09/20 0103 12/11/20 0617 12/13/20 0842  AST  --  14*  --   --   ALT  --  5  --   --   ALKPHOS  --  106  --   --   BILITOT  --  0.6  --   --   PROT  --  6.7  --   --   ALBUMIN 1.9* 2.0* 2.0* 2.3*   No results for input(s): LIPASE, AMYLASE in the last 168 hours. Recent Labs  Lab 12/08/20 1716  AMMONIA 15   Coagulation profile No results for input(s): INR, PROTIME in the last 168 hours.  CBC: Recent Labs  Lab 12/08/20 0516 12/09/20 0103 12/10/20 0101 12/11/20 0617 12/13/20 0842  WBC 11.7* 14.8* 12.8* 12.3* 11.8*  HGB 8.0* 8.3* 7.8* 7.4* 7.8*  HCT 24.8* 27.2* 26.1*  24.3* 25.9*  MCV 90.8 91.3 92.6 92.0 93.8  PLT 353 387 330 321 339   Cardiac Enzymes: No results for input(s): CKTOTAL, CKMB, CKMBINDEX, TROPONINI in the last 168 hours. BNP (last 3 results) No results for input(s): PROBNP in the last 8760 hours. CBG: Recent Labs  Lab 12/09/20 1949 12/09/20 2344 12/10/20 0320 12/10/20 0803 12/10/20 1602  GLUCAP 90 102* 98 87 91   D-Dimer: No results for input(s): DDIMER in the last 72 hours. Hgb A1c: No results for input(s): HGBA1C in the last 72 hours. Lipid Profile: No results for input(s): CHOL, HDL, LDLCALC, TRIG, CHOLHDL, LDLDIRECT in the last 72 hours. Thyroid function studies: No results for input(s): TSH, T4TOTAL, T3FREE, THYROIDAB in the last 72 hours.  Invalid input(s): FREET3 Anemia work up: No results for input(s): VITAMINB12, FOLATE, FERRITIN, TIBC, IRON, RETICCTPCT in the last 72 hours. Sepsis Labs: Recent Labs  Lab 12/09/20 0103 12/10/20 0101 12/11/20 0617 12/13/20 0842  WBC 14.8* 12.8* 12.3* 11.8*    Microbiology Recent Results (from the past 240 hour(s))  Culture, blood (Routine X 2) w Reflex to ID Panel     Status: None (Preliminary result)   Collection Time: 12/09/20 10:16 AM   Specimen: BLOOD  Result Value Ref Range Status   Specimen Description BLOOD RIGHT ANTECUBITAL  Final   Special Requests   Final    BOTTLES DRAWN AEROBIC AND ANAEROBIC Blood Culture results may not be optimal due to an inadequate volume of blood received in culture bottles   Culture   Final    NO GROWTH 4 DAYS Performed at [facility], [place] [address] [place], [place] [address]    Report Status PENDING  Incomplete  MRSA PCR Screening     Status: None   Collection Time: 12/11/20  3:48 PM   Specimen: Nasal Mucosa; Nasopharyngeal  Result Value Ref Range Status   MRSA by PCR NEGATIVE NEGATIVE Final    Comment:        The GeneXpert MRSA Assay (FDA approved for NASAL specimens only), is one component of a comprehensive MRSA  colonization surveillance program.  It is not intended to diagnose MRSA infection nor to guide or monitor treatment for MRSA infections. Performed at [facility], [place] [address], [place], [place] [address]     Procedures and diagnostic studies:  No results found.  Medications:   . (feeding supplement) PROSource Plus  30 mL Oral TID PC & HS  . amiodarone  200 mg Oral BID  . atorvastatin  80 mg Oral q1800  . Chlorhexidine Gluconate Cloth  6 each Topical Q0600  . darbepoetin (ARANESP) injection - DIALYSIS  100 mcg Intravenous Q Mon-HD  . docusate sodium  100 mg Oral BID  . heparin injection (subcutaneous)  5,000 Units Subcutaneous Q8H  . lactobacillus acidophilus  1 tablet Oral TID  . mouth rinse  15 mL Mouth Rinse BID  . melatonin  5 mg Oral QHS  . metoprolol tartrate  37.5 mg Oral BID  . midodrine  5 mg Oral TID WC  . multivitamin  1 tablet Oral QHS  . nutrition supplement (JUVEN)  1 packet Oral BID BM  . pantoprazole  40 mg Oral BID  . polyethylene glycol  17 g Oral Daily  . QUEtiapine  75 mg Oral QHS  . sevelamer carbonate  1.6 g Oral TID WC  . sodium chloride flush  3 mL Intravenous Q12H  . ticagrelor  90 mg Oral BID   Continuous Infusions: . sodium chloride 250 mL (11/27/20 0902)  . sodium chloride    .  ceFAZolin (ANCEF) IV Stopped (12/13/20 1326)     LOS: 60 days   [place] Hospitalists   How to contact the [facility] Attending or Consulting provider [place] or covering provider during after hours [place], for this patient?  1. Check the care team in [facility] and look for a) attending/consulting [facility] provider listed and b) the [facility] team listed 2. Log into [email or web address] and use [place]'s universal password to access. If you do not have the password, please contact the hospital operator. 3. Locate the [facility] provider you are looking for under [facility] and page to a number that you can be directly reached. 4. If you still have difficulty reaching  the provider, please page the [facility] (Director on Call) for the Hospitalists listed on amion for assistance.  12/13/2020, 1:55 PM

## 2020-12-13 NOTE — Procedures (Signed)
I was present at this dialysis session. I have reviewed the session itself and made appropriate changes.   4K bath, 1L UF.  Using AVF  [facility] Weights   12/11/20 1256 12/12/20 0330 12/13/20 0646  Weight: 74.8 kg 73.5 kg 74 kg    Recent Labs  Lab 12/11/20 0617  NA 134*  K 3.5  CL 97*  CO2 26  GLUCOSE 88  BUN 72*  CREATININE 7.16*  CALCIUM 10.1  PHOS 5.6*    Recent Labs  Lab 12/09/20 0103 12/10/20 0101 12/11/20 0617  WBC 14.8* 12.8* 12.3*  HGB 8.3* 7.8* 7.4*  HCT 27.2* 26.1* 24.3*  MCV 91.3 92.6 92.0  PLT 387 330 321    Scheduled Meds: . (feeding supplement) PROSource Plus  30 mL Oral TID PC & HS  . amiodarone  200 mg Oral BID  . atorvastatin  80 mg Oral q1800  . Chlorhexidine Gluconate Cloth  6 each Topical Q0600  . darbepoetin (ARANESP) injection - DIALYSIS  100 mcg Intravenous Q Mon-HD  . docusate sodium  100 mg Oral BID  . heparin injection (subcutaneous)  5,000 Units Subcutaneous Q8H  . lactobacillus acidophilus  1 tablet Oral TID  . mouth rinse  15 mL Mouth Rinse BID  . melatonin  5 mg Oral QHS  . metoprolol tartrate  37.5 mg Oral BID  . midodrine  5 mg Oral TID WC  . multivitamin  1 tablet Oral QHS  . nutrition supplement (JUVEN)  1 packet Oral BID BM  . pantoprazole  40 mg Oral BID  . polyethylene glycol  17 g Oral Daily  . QUEtiapine  75 mg Oral QHS  . sevelamer carbonate  1.6 g Oral TID WC  . sodium chloride flush  3 mL Intravenous Q12H  . ticagrelor  90 mg Oral BID   Continuous Infusions: . sodium chloride 250 mL (11/27/20 0902)  . sodium chloride    .  ceFAZolin (ANCEF) IV Stopped (12/11/20 1206)   PRN Meds:.sodium chloride, acetaminophen, heparin, hydrocerin, LORazepam, ondansetron (ZOFRAN) IV, oxyCODONE, Resource ThickenUp Clear, senna, silver nitrate applicators, sodium chloride, sodium chloride flush   Pearson Grippe  MD 12/13/2020, 8:45 AM

## 2020-12-13 NOTE — Progress Notes (Signed)
Inpatient Rehabilitation-Admissions Coordinator   Discussed case again this AM with admitting PM&R MD, Dr. Posey Pronto. Pt is now being recommended for home with [facility] per therapy as functionally he is doing quite well. While pt does have an extensive sacral wound and complex medical issues, his functional level does not warrant an IP Rehab stay at this time. AC will not pursue admission. Will discuss with the patient and his wife.   Raechel Ache, OTR/L  Rehab Admissions Coordinator  [phone] 12/13/2020 12:36 PM

## 2020-12-13 NOTE — TOC Transition Note (Addendum)
Transition of Care [facility]) - CM/SW Discharge Note   Patient Details  Name: Travis [name] MRN: [id number] Date of Birth: 1961-10-23  Transition of Care [facility]) CM/SW Contact:  Zenon Mayo, RN Phone Number: 12/13/2020, 4:03 PM   Clinical Narrative:    NCM received call from Patient 's wife, her name is [id number] [phone], patient was declined by CIR.  She states patient will need 3 n 1, and rolling walker, (he does not want a rollator or a w/chair).  NCM offered choice for [facility] services, wife states they do not have a preference for [place], [place], [place].  NCM made referral to [facility] with Alvis Lemmings, he is able to take referral.  Soc will begin 24 to 48 hrs post dc. Wife states she will transport patient home tomorrow.  Also patient will be on brilinta, awaiting benefit check. Per benefit check patient insurance has termed.  NCM informed wife and patient of this information, wife is to bring information of updated insurance in am.       Final next level of care: [place] Barriers to Discharge: Continued Medical Work up   Patient Goals and CMS Choice Patient states their goals for this hospitalization and ongoing recovery are:: go home CMS Medicare.gov Compare Post Acute Care list provided to:: Patient Represenative (must comment) Choice offered to / list presented to : Spouse  Discharge Placement                       Discharge Plan and Services                DME Arranged: 3-N-1,Walker rolling DME Agency: [employer] Date DME Agency Contacted: 12/13/20 Time DME Agency Contacted: 3300 Representative spoke with at DME Agency: [place]: RN,PT,OT [place] Agency: [place] Date [place]: 12/13/20 Time [place]: 1603 Representative spoke with at [place]: [place] ([place]) Interventions     Readmission Risk Interventions No flowsheet data found.

## 2020-12-14 ENCOUNTER — Other Ambulatory Visit (HOSPITAL_COMMUNITY): Payer: Self-pay | Admitting: Family Medicine

## 2020-12-14 DIAGNOSIS — I2109 ST elevation (STEMI) myocardial infarction involving other coronary artery of anterior wall: Secondary | ICD-10-CM | POA: Diagnosis not present

## 2020-12-14 LAB — CULTURE, BLOOD (ROUTINE X 2): Culture: NO GROWTH

## 2020-12-14 MED ORDER — ATORVASTATIN CALCIUM 80 MG PO TABS: 80.0000 mg | ORAL_TABLET | Freq: Every day | ORAL | 0 refills | Status: DC

## 2020-12-14 MED ORDER — METOPROLOL TARTRATE 37.5 MG PO TABS: 37.5000 mg | ORAL_TABLET | Freq: Two times a day (BID) | ORAL | 0 refills | Status: DC

## 2020-12-14 MED ORDER — TICAGRELOR 90 MG PO TABS: 90.0000 mg | ORAL_TABLET | Freq: Two times a day (BID) | ORAL | 0 refills | Status: DC

## 2020-12-14 MED ORDER — PANTOPRAZOLE SODIUM 40 MG PO TBEC: 40.0000 mg | DELAYED_RELEASE_TABLET | Freq: Two times a day (BID) | ORAL | 0 refills | Status: AC

## 2020-12-14 MED ORDER — MIDODRINE HCL 5 MG PO TABS: 5.0000 mg | ORAL_TABLET | Freq: Three times a day (TID) | ORAL | 0 refills | Status: AC

## 2020-12-14 MED ORDER — CEFAZOLIN SODIUM-DEXTROSE 2-4 GM/100ML-% IV SOLN: 2.0000 g | INTRAVENOUS | 0 refills | Status: AC

## 2020-12-14 MED ORDER — AMIODARONE HCL 200 MG PO TABS: 200.0000 mg | ORAL_TABLET | Freq: Two times a day (BID) | ORAL | 0 refills | Status: DC

## 2020-12-14 MED FILL — BRILINTA 90 MG TABLET: 90 | 30 days supply | Qty: 60 | Fill #0

## 2020-12-14 MED FILL — METOPROLOL TARTRATE 25 MG T: 25 | 30 days supply | Qty: 90 | Fill #0

## 2020-12-14 MED FILL — ATORVASTATIN CALCIUM 80 MG: 80 | 30 days supply | Qty: 30 | Fill #0

## 2020-12-14 MED FILL — MIDODRINE HCL 5 MG TABS: 5 | 30 days supply | Qty: 90 | Fill #0

## 2020-12-14 MED FILL — PANTOPRAZOLE SOD DR 40 MG T: 40 | 30 days supply | Qty: 60 | Fill #0

## 2020-12-14 MED FILL — AMIODARONE HCL 200 MG TAB: 200 | 30 days supply | Qty: 60 | Fill #0

## 2020-12-14 NOTE — Discharge Summary (Signed)
Physician Discharge Summary  Travis [name] VZD:[id number] DOB: 1961-06-27 DOA: 10/28/2020  PCP: London Pepper, MD  Admit date: 10/28/2020 Discharge date: 12/14/2020  Admitted From: Home Disposition: Home  Recommendations for Outpatient Follow-up:  1. Follow up with PCP in 1-2 weeks 2. Follow-up with cardiology in 4 weeks 3. Follow-up with GI in 4 weeks 4. Please obtain BMP/CBC in one week 5. Please follow up with your PCP on the following pending results: Unresulted Labs (From admission, onward)          Start     Ordered   12/09/20 0952  Expectorated sputum assessment w rflx to resp cult  Once,   R        12/09/20 0951   12/06/20 0500  Renal function panel  Every Mon-Wed-Fri (0500),   R     Question:  Specimen collection method  Answer:  Lab=Lab collect   12/04/20 1149           Home Health: Yes Equipment/Devices: Rolling walker   Discharge Condition: Stable CODE STATUS: Full code Diet recommendation: Cardiac and low-sodium/renal  Subjective: Seen and examined.  He has no complaints.  He sitting in his chair, very comfortable and not even requiring any oxygen.  Denies any abdominal pain, chest pain, palpitation, shortness of breath, cough or any other complaint.  Brief/Interim Summary: Please note that patient has spent 47 days in the hospital and he has been seen by several physicians and several specialties including cardiology, GI, ICU and hospitalist.  I have seen this patient only twice, yesterday and today.  Following discharge summary is my best effort after reviewing detailed chart of this patient.   Travis [name] is an 60 y.o. male with history of ESRD on intermittent HD, HTN, CAD-who presented with anterior STEMI-treated with DES to LAD-hospital course prolonged and complicated by atrial fibrillation with RVR requiring cardioversion x2,, septic shock due to MSSA bacteremia, brief PEA arrest requiring CPR, acute hypoxic respiratory failure requiring  intubation, upper GI bleed with acute blood loss anemia due to severe esophagitis requiring about 14 units of PRBC transfusion, severe metabolic encephalopathy/ICU delirium, bilateral lung abscesses with empyema requiring chest tube insertion--stabilized in the ICU-subsequently transferred to the [facility] hospitalist service on 12/12, his chest tube was dislodged on the floor, he started developing increasing right-sided pleural effusion/empyema, patient was transferred back to ICU and chest tube was placed.  He was transferred back to [facility] on 12/18.  Worsening respiratory failure due to volume overload and patient was re-intubated on 12/19 then extubated on 11/29/2020.  He then was on Precedex drip for some time due to anxiety and delirium.    He was once again transferred under [facility] hospitalist on  12/06/2021.  Nephrology was also on board throughout his hospitalization for which she received dialysis.  All his medical issues were stable.  He was then evaluated by PT OT who initially recommended CIR for this patient however patient progressed very well to the point that PT OT then changed the recommendations to home with home health.  He was declined by CIR.  Patient has remained off of oxygen since yesterday.  Due to MSSA bacteremia, patient has been on cefazolin on dialysis days and he needs to complete 6 weeks..  There are only 2-3 more doses are pending which he will receive with dialysis.  Cardiology, GI and PCCM had signed off several days ago.  Nephrology has cleared the patient for discharge as well.  I saw this patient yesterday for  the first time, he wanted to go home yesterday however he was still on 1 to 2 L of oxygen and since this was my first introduction to the patient so for the sake of safety and safe discharge, we kept this patient overnight here.  He is desperate about going home today.  His wife however had several concerns about him going home especially the fact that patient has remained in the  hospital for several days and he has improved and then he had declined suddenly which makes her nervous.  She was demanding that patient can stay in the hospital for few more days, a week or 2.  I answered all her questions over the speaker phone while patient was listening.  Despite of her trying to push discharge for few more days, patient kept telling her that he is ready and he wants to go home and that he does not require any further hospitalization.  Despite of my best efforts to try to convince the wife due to the fact that patient is currently medically stable for discharge, she remained upset however patient apologized for that on her behalf and requested to discharge.   For detailed information, following is significant Hospital events in brief.  Significant Hospital Events   11/20 STEMI-stent LAD.  11/21 Afib with RVR, s.p cardioversion  11/23 S/P DCCV of recurrent Afib. Intubated. Septic PNA and cardiogenic shock.  11/24 Brief brady arrest ADD: Start CRRT.  11/30 Stool output from mouth> Ileus.  12/2 EGD: Esophagitis. No bleeding and probable ischemic duodenitis  12/3 extubated.  12/4 CRRT d/c'd. Delirious  12/5 iHD  12/9 R CT inserted for pleural effusion.  12/11 Given a course of TPA and lytics via chest tube 12/12 Tx to [facility],  12/13 Pt turned over and pulled chest tube and there is leak, stat CXR ordered.  12/14 Chest tube replaced. Readmit to ICU with worsening pleural effusion and hypotension. Levo started.  12/19 Txf back to ICU for resp failure, failed bipap and was intubated.  12/22 Extubated.  Has tenuous respiratory status requiring BiPAP 12/23 Multidisciplinary goals of care meeting.  Remains full code. 12/25 Remains on Precedex due to anxiety, delirium.   Discharge Diagnoses:  Principal Problem:   Acute ST elevation myocardial infarction (STEMI) of anterolateral wall (HCC) Active Problems:   ESRD (end stage renal disease) (HCC)   UGIB (upper gastrointestinal  bleed)   Essential hypertension   Anemia due to GI blood loss   Hyperlipidemia with target LDL less than 70   3 V- CAD w/ ACS/STEMI: Culprit = 99% mLAD @ D1 (DES PCI jailing D1); 99% ost-AVGLCx, 80% calcified napkin ring prox RCA.    Presence of drug coated stent in LAD coronary artery: Resolute Onyx DES 2.75 mm x 18 mm (3.1 mm) at major D1 & SP1.   Acute combined systolic and diastolic heart failure (HCC)   Atrial fibrillation with RVR (HCC)   Bacteremia   Septic shock due to Staphylococcus aureus [facility])   Cardiac arrest ([place])   Pneumonia of right lower lobe due to methicillin susceptible Staphylococcus aureus (MSSA) (HCC)   Severe mitral regurgitation   Acute myocardial infarction (HCC)   Acute ST elevation myocardial infarction (STEMI) involving left anterior descending (LAD) coronary artery (HCC)   Pressure injury of skin   Protein-calorie malnutrition, severe   Pleural effusion   Empyema ([place])   Chest tube in place   PAF (paroxysmal atrial fibrillation) [facility])    Discharge Instructions  Discharge Instructions  Amb Referral to Cardiac Rehabilitation   Complete by: As directed    Diagnosis:  STEMI Coronary Stents     After initial evaluation and assessments completed: Virtual Based Care may be provided alone or in conjunction with Phase 2 Cardiac Rehab based on patient barriers.: Yes     Allergies as of 12/14/2020      Reactions   Ibuprofen Hives   Lisinopril Swelling   PT states he is allergic to all prils; caused facial swelling   Naproxen Hives, Other (See Comments)   Alleve causes patient to have hives      Medication List    STOP taking these medications   iron polysaccharides 150 MG capsule Commonly known as: NIFEREX   labetalol 300 MG tablet Commonly known as: NORMODYNE     TAKE these medications   acetaminophen 325 MG tablet Commonly known as: TYLENOL Take 650 mg by mouth every 6 (six) hours as needed for mild pain, fever or headache.   amiodarone  200 MG tablet Commonly known as: PACERONE Take 1 tablet (200 mg total) by mouth 2 (two) times daily.   atorvastatin 80 MG tablet Commonly known as: LIPITOR Take 1 tablet (80 mg total) by mouth daily at 6 PM.   Auryxia 1 GM 210 MG(Fe) tablet Generic drug: ferric citrate Take 210-630 mg by mouth See admin instructions. 660m three times daily with meals and 210-4273mwith snacks   ceFAZolin 2-4 GM/100ML-% IVPB Commonly known as: ANCEF Inject 100 mLs (2 g total) into the vein every Monday, Wednesday, and Friday with hemodialysis for 2 doses. Start taking on: December 15, 2020   cinacalcet 60 MG tablet Commonly known as: SENSIPAR Take 120 mg by mouth daily.   Metoprolol Tartrate 37.5 MG Tabs Take 37.5 mg by mouth 2 (two) times daily.   midodrine 5 MG tablet Commonly known as: PROAMATINE Take 1 tablet (5 mg total) by mouth 3 (three) times daily with meals.   multivitamin Tabs tablet Take 1 tablet by mouth daily.   pantoprazole 40 MG tablet Commonly known as: PROTONIX Take 1 tablet (40 mg total) by mouth 2 (two) times daily.   ticagrelor 90 MG Tabs tablet Commonly known as: BRILINTA Take 1 tablet (90 mg total) by mouth 2 (two) times daily.   Vitamin D (Ergocalciferol) 1.25 MG (50000 UNIT) Caps capsule Commonly known as: DRISDOL Take 50,000 Units by mouth once a week.            Durable Medical Equipment  (From admission, onward)         Start     Ordered   12/13/20 1553  For home use only DME Walker rolling  Once       Question Answer Comment  Walker: With 5 Inch Wheels   Patient needs a walker to treat with the following condition Weakness      12/13/20 1552   12/13/20 1553  For home use only DME 3 n 1  Once        12/13/20 1553          Follow-up Information    [facility] Follow up.   Why: rolling walker and 3 n 1 will be brought to room on 1/5. Contact information: [phone]        [facility]   Specialty: Home Health Services Why: [facility] Contact information: [address] [phone]  London Pepper, MD Follow up in 1 week(s).   Specialty: Family Medicine Contact information: [place] [address] [phone]        Leonie Man, MD .   Specialty: Cardiology Contact information: [address]  [place] [address] [phone]              Allergies  Allergen Reactions  . Ibuprofen Hives  . Lisinopril Swelling    PT states he is allergic to all prils; caused facial swelling  . Naproxen Hives and Other (See Comments)    Alleve causes patient to have hives    Consultations: PCCM, cardiology, GI, nephrology   Procedures/Studies: CT ABDOMEN PELVIS WO CONTRAST  Result Date: 11/15/2020 CLINICAL DATA:  60 year old male with bacteremia last month, end stage renal disease on dialysis. Severe esophagitis and duodenal ulcers on recent EGD. Continued drop in hemoglobin with liquid black stool suspicious for ongoing upper GI blood loss. EXAM: CT ABDOMEN AND PELVIS WITHOUT CONTRAST TECHNIQUE: Multidetector CT imaging of the abdomen and pelvis was performed following the standard protocol without IV contrast. COMPARISON:  CT Abdomen and Pelvis 11/07/2020. FINDINGS: Lower chest: Increased layering pleural effusions at the lung bases, small. Continued bilateral lower lobe consolidation, although more suggestive of atelectasis than pneumonia on previous contrast enhanced CT. Overall stable lung base ventilation. Stable mild cardiomegaly. No pericardial effusion. Hepatobiliary: Contracted gallbladder now with mild pericholecystic fluid and/or gallbladder wall thickening (series 3, image 32). No other regional inflammation. Negative noncontrast liver. Pancreas: Negative noncontrast pancreas. Spleen: Negative. Adrenals/Urinary Tract: No retroperitoneal  hematoma. Stable native left renal atrophy. Absent right kidney. Thickening of the adrenal glands is stable compatible with adrenal hyperplasia. Completely decompressed urinary bladder as before. Stomach/Bowel: Rectal tube in place. Intermittent diverticulosis of the large bowel, moderate overall with most segments affected. Some residual oral contrast within the large bowel and especially diverticula. No large bowel inflammation identified. Normal appendix (series 3, image 52). No dilated small bowel.  No free air or free fluid. Enteric tube terminates at the gastric antrum. Unremarkable visible distal esophagus, stomach and duodenum. Vascular/Lymphatic: Widespread calcified atherosclerosis, including involvement of the aorta. Normal caliber abdominal aorta. Vascular patency is not evaluated in the absence of IV contrast. No lymphadenopathy. Reproductive: Negative. Other: Generalized increased subcutaneous body wall edema since November (series 3, image 49 today). Similar increased but mild presacral stranding. No pelvic free fluid. Musculoskeletal: Lower thoracic spina bifida occulta (normal variant). 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 No acute osseous abnormality identified. IMPRESSION: 1. Negative for retroperitoneal bleeding or other hematoma collection in the abdomen or pelvis. 2. A degree of anasarca has developed since 11/07/2020, including new body wall edema and small layering pleural effusions. Contracted gallbladder with new mild pericholecystic fluid and/or wall edema also suspected due to anasarca. 3. Enteric tube terminates in the distal stomach. Rectal tube in place. No bowel inflammation is evident. Widespread large bowel diverticulosis. 4. Continued bilateral lower lobe lung consolidation, although more suggestive of atelectasis than pneumonia on prior CT. 5. Aortic Atherosclerosis (ICD10-I70.0). Electronically Signed   By: Genevie Ann M.D.   On: 11/15/2020 23:45   DG Chest 1 View  Result Date:  11/21/2020 CLINICAL DATA:  Pleural effusion. EXAM: CHEST  1 VIEW COMPARISON:  11/20/2020. FINDINGS: Feeding tube, left subclavian line, right IJ line in stable position. Stable cardiomegaly. Progressive dense right base atelectasis/consolidation. Large progressive right-sided pleural effusion. Previously identified drainage tube over the right chest/upper abdomen not identified on today's exam. Mild right  upper chest wall subcutaneous emphysema. IMPRESSION: 1. Progressive dense right base atelectasis/consolidation. Large progressive right-sided pleural effusion. 2. Previously identified drainage tube over the right chest/upper abdomen not identified on today's exam. 3. Feeding tube and bilateral IJ lines in stable position. 4. Stable cardiomegaly. Electronically Signed   By: Marcello Moores  Register   On: 11/21/2020 06:23   DG Chest 1 View  Result Date: 11/14/2020 CLINICAL DATA:  Congestion, possible aspiration EXAM: CHEST  1 VIEW COMPARISON:  Portable exam 1601 hours compared to 11/08/2020 FINDINGS: Feeding tube extends into the stomach. RIGHT jugular line tip projects over RIGHT atrium unchanged. LEFT subclavian central venous catheter tip projects over LEFT brachiocephalic vein near SVC confluence unchanged. Enlargement of cardiac silhouette. Moderate partially loculated RIGHT pleural effusion. Bibasilar consolidation versus atelectasis. Upper lungs clear. No pneumothorax. IMPRESSION: Partially loculated RIGHT pleural effusion. Enlargement of cardiac silhouette. Atelectasis versus consolidation of the lower lobes. Electronically Signed   By: Lavonia Dana M.D.   On: 11/14/2020 19:00   DG Abd 1 View  Result Date: 11/26/2020 CLINICAL DATA:  Enteric tube placement EXAM: ABDOMEN - 1 VIEW COMPARISON:  November 09, 2020 FINDINGS: The tip of the enteric tube projects over the gastric body. The tip is pointed distally. Hazy bilateral airspace opacities are noted at the lung bases. There is a probable right-sided pleural  effusion. IMPRESSION: Enteric tube projects over the gastric body. Electronically Signed   By: Constance Holster M.D.   On: 11/26/2020 18:43   CT CHEST WO CONTRAST  Result Date: 11/26/2020 CLINICAL DATA:  Respiratory failure. EXAM: CT CHEST WITHOUT CONTRAST TECHNIQUE: Multidetector CT imaging of the chest was performed following the standard protocol without IV contrast. COMPARISON:  CT chest dated November 18, 2020. FINDINGS: Cardiovascular: The heart size is mildly enlarged. There is a small pericardial effusion. There is a probable ascending thoracic aortic aneurysm measuring up to 4.4 cm. However this is suboptimally evaluated secondary to pulsation artifact. Advanced coronary artery calcifications are noted. Mediastinum/Nodes: -- No mediastinal lymphadenopathy. -- No hilar lymphadenopathy. -- No axillary lymphadenopathy. -- No supraclavicular lymphadenopathy. --there is a left-sided thyroid nodule measuring approximately 2.3 cm. -  Unremarkable esophagus. Lungs/Pleura: Diffuse perihilar ground-glass airspace opacities and consolidation are noted. This is new since the patient's prior study dated 11/18/2020. The endotracheal tube terminates above the carina. There is no pneumothorax. There is interlobular septal thickening. There is dense consolidation at the left lung base. There is a persistent mass in the peripheral right middle lobe measuring approximately 4.3 cm. Portions of this mass suggests underlying cavitation. There are bilateral pleural effusions that appear to be moderate size but are not well evaluated secondary to extensive streak artifact and lack of IV contrast. Upper Abdomen: There is no acute abnormality in the upper abdomen. The tip of the enteric tube is not visualized on this study. Musculoskeletal: No chest wall abnormality. No bony spinal canal stenosis. IMPRESSION: 1. Lines and tubes as above. 2. Diffuse perihilar ground-glass airspace opacities and consolidation with an upper lobe  predominance. This is favored to be secondary to pulmonary edema or developing ARDS. An atypical infectious process is not excluded. 3. Bibasilar consolidation and bilateral pleural effusions are noted. 4. Persistent mass in the right middle lobe with underlying cavitation follow-up to radiologic resolution is recommended. 5. Cardiomegaly. 6. Ascending thoracic aortic aneurysm that appears to be measuring up to approximately 4.3 cm but is suboptimally evaluated secondary to pulsation artifact. Recommend annual imaging followup by CTA or MRA. This recommendation follows 2010 ACCF/AHA/AATS/ACR/ASA/SCA/SCAI/SIR/STS/SVM Guidelines  for the Diagnosis and Management of Patients with Thoracic Aortic Disease. Circulation. 2010; 121: Z610-R604. Aortic aneurysm NOS (ICD10-I71.9) 7. No pneumothorax.  No displaced rib fracture. 8. Left-sided thyroid nodule measuring approximately 2.3 cm. Recommend thyroid US as an outpatient(ref: J Am Coll Radiol. 2015 Feb;12(2): 143-50). Aortic Atherosclerosis (ICD10-I70.0). Electronically Signed   By: Constance Holster M.D.   On: 11/26/2020 22:28   CT CHEST WO CONTRAST  Result Date: 11/18/2020 CLINICAL DATA:  60 year old male with history of empyema. EXAM: CT CHEST WITHOUT CONTRAST TECHNIQUE: Multidetector CT imaging of the chest was performed following the standard protocol without IV contrast. COMPARISON:  Chest CT 10/31/2020. FINDINGS: Comment: Today's study is limited by lack of IV contrast for detection and characterization of visceral and/or vascular lesions. Cardiovascular: Heart size is mildly enlarged. There is no significant pericardial fluid, thickening or pericardial calcification. There is aortic atherosclerosis, as well as atherosclerosis of the great vessels of the mediastinum and the coronary arteries, including calcified atherosclerotic plaque in the left main, left anterior descending, left circumflex and right coronary arteries. Moderate calcifications of the aortic  valve and mitral annulus. Right internal jugular central venous catheter with tip terminating in the right atrium. Mediastinum/Nodes: No pathologically enlarged mediastinal or hilar lymph nodes. Please note that accurate exclusion of hilar adenopathy is limited on noncontrast CT scans. Nasogastric tube extending into the stomach. Esophagus is otherwise unremarkable in appearance. No axillary lymphadenopathy. Lungs/Pleura: Much of the previously noted patchy areas of airspace consolidation have regressed compared to the prior study, although several of these areas are now far more nodular and mass-like in appearance. The largest residual area of consolidation is in the lateral segment of the right middle lobe (axial image 113 of series 3) where there is a 4.4 x 2.5 cm area of consolidation which is relatively low attenuation (23 HU) and appears to have some convex margins, likely to reflect an area of developing pulmonary necrosis and abscess. Several other thick-walled cavities are noted (example on axial image 49 of series 4 in the periphery of the left upper lobe), which likely represents smaller resolving abscesses. Extensive volume loss is also noted, with associated thickening of the peribronchovascular interstitium and regional areas of architectural distortion, most evident in the lower lobes of the lungs bilaterally. Small left and small to moderate right pleural effusions are present, very complex in appearance with extensive pleural thickening and multiple loculated fluid collections. The largest of these loculated fluid collections is in the left hemithorax is located anteriorly adjacent to the left upper lobe (axial image 53 of series 3) measuring 3.7 x 2.2 cm. Small bore chest tube in the posterior aspect of the right hemithorax appears appropriately located. Upper Abdomen: Aortic atherosclerosis. Musculoskeletal: There are no aggressive appearing lytic or blastic lesions noted in the visualized  portions of the skeleton. IMPRESSION: 1. Resolving multilobar bilateral pneumonia with evidence of widespread pulmonary necrosis and multiple pulmonary abscesses, as well as bilateral empyemas, as detailed above. 2. Aortic atherosclerosis, in addition to left main and 3 vessel coronary artery disease. Please note that although the presence of coronary artery calcium documents the presence of coronary artery disease, the severity of this disease and any potential stenosis cannot be assessed on this non-gated CT examination. Assessment for potential risk factor modification, dietary therapy or pharmacologic therapy may be warranted, if clinically indicated. 3. Mild cardiomegaly. Aortic Atherosclerosis (ICD10-I70.0). Electronically Signed   By: Vinnie Langton M.D.   On: 11/18/2020 10:40   IR US Guide Vasc Access  Left  Result Date: 11/20/2020 INDICATION: 60 year old male with a history of malfunctioning left upper extremity fistula. 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 A recent duplex approximately 2-3 weeks prior demonstrates a flow volume of 1816 mL per minute, with no evidence stenosis. Given malfunction, he presents for fistulagram EXAM: ULTRASOUND-GUIDED ACCESS LEFT UPPER EXTREMITY FISTULA BALLOON ANGIOPLASTY OF OUTFLOW STENOSES MEDICATIONS: None. ANESTHESIA/SEDATION: Moderate Sedation Time:  0 minutes The patient was continuously monitored during the procedure by the interventional radiology nurse under my direct supervision. FLUOROSCOPY TIME:  Fluoroscopy Time: 3 minutes 24 seconds (40 mGy). COMPLICATIONS: None PROCEDURE: Informed written consent was obtained from the patient after a thorough discussion of the procedural risks, benefits and alternatives. All questions were addressed. Maximal Sterile Barrier Technique was utilized including caps, mask, sterile gowns, sterile gloves, sterile drape, hand hygiene and  skin antiseptic. A timeout was performed prior to the initiation of the procedure. Ultrasound survey was performed with images stored and sent to PACs. The left upper extremity was prepped and draped in the usual sterile fashion. Ultrasound guidance was used to access the left upper extremity graft with a micropuncture kit. Once the 4 French sheath was within the graft, left upper extremity fistulogram was performed. Images were carried out to the central vasculature. Bentson wire was then placed into the outflow and a 6 French sheath was placed. Combination of Glidewire and the Bentson wire and an angled Kumpe the catheter were used to navigate through the tandem stenoses of the venous outflow. Once the Glidewire and the Kumpe the catheter were through the tandem stenoses, the Bentson wire was placed through the catheter and the catheter was removed. Serial standard balloon angioplasty was then performed at the outflow stenoses first 6 mm then 8 mm. Finally 12 mm balloon angioplasty was performed first at the most central of the stenoses (greater than 95%) and then at the more distal. After balloon angioplasty of venous outflow was performed, balloon was inflated and reflux images of the arterial anastomosis performed. Given the volume measured on the recent duplex of greater than 1800 cc/minute we elected not to puncture in a peripheral directed access. All catheters wires were removed and a suture was placed at the access point for hemostasis. Sterile bandage was placed. Excellent thrill was confirmed at the conclusion. Patient tolerated the procedure well remained hemodynamically stable throughout. No complications were encountered and no significant blood loss. 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 Both of these were treated to 0% residual with standard balloon angioplasty to 12 mm.  Excellent thrill upon completion. IMPRESSION: Status post ultrasound guided access left upper extremity fistula for standard balloon angioplasty treatment of tandem high-grade outflow stenoses, with 0% residual after treatment. Signed, Dulcy Fanny. Dellia Nims, RPVI [facility] Radiology ACCESS: Recommend early duplex evaluation of flow volumes and the arteriovenous anastomosis. This access remains amenable to future percutaneous interventions as clinically indicated. Electronically Signed   By: Corrie Mckusick D.O.   On: 11/20/2020 15:11   DG CHEST PORT 1 VIEW  Result Date: 12/08/2020 CLINICAL DATA:  Confused, weakness, acute ST-elevation myocardial infarction of anterolateral wall. Evaluate bilateral infiltrates or edema. Pleural effusion. EXAM: PORTABLE CHEST 1 VIEW COMPARISON:  Chest x-ray dated 12/02/2020. FINDINGS: Improved aeration of both lungs. Probable small bilateral pleural effusions. No pneumothorax is seen.  Stable cardiomegaly. Enteric tube has been removed. IMPRESSION: 1. Improved aeration of both lungs compared to yesterday's chest x-ray. Probable small bilateral pleural effusions. 2. Stable cardiomegaly. Electronically Signed   By: Franki Cabot M.D.   On: 12/08/2020 11:47   DG Chest Port 1 View  Result Date: 12/02/2020 CLINICAL DATA:  Reason for exam: acute respiratory failure EXAM: PORTABLE CHEST - 1 VIEW COMPARISON:  11/29/2020 FINDINGS: Interval extubation. Feeding tube remains in place, tip not seen. Interstitial and airspace opacities with a predominantly perihilar distribution, left greater than right, partially proved since previous. Stable cardiomegaly. Blunting of lateral costophrenic angle suggesting small pleural effusions. No pneumothorax. Regional bones unremarkable. IMPRESSION: 1. Interval extubation with improving bilateral infiltrates or edema. 2. Small pleural effusions. Electronically Signed   By: Lucrezia Europe M.D.   On: 12/02/2020  08:31   DG CHEST PORT 1 VIEW  Result Date: 11/29/2020 CLINICAL DATA:  Intubated EXAM: PORTABLE CHEST 1 VIEW COMPARISON:  11/28/2020 FINDINGS: Endotracheal tube and partially imaged enteric tube. Persistent bilateral primarily perihilar and basilar opacities. Similar small right pleural effusion. No pneumothorax. Stable cardiomediastinal contours. IMPRESSION: Stable lines and tubes. Persistent bilateral perihilar and basilar opacities and small right pleural effusion. Electronically Signed   By: Macy Mis M.D.   On: 11/29/2020 10:38   DG Chest Port 1 View  Result Date: 11/28/2020 CLINICAL DATA:  Pulmonary edema EXAM: PORTABLE CHEST 1 VIEW COMPARISON:  11/26/2020 and prior. FINDINGS: Support lines and tubes are unchanged. Cardiomegaly. No pneumothorax. Small right pleural effusion. The left costophrenic sulcus is partially imaged. Decreased conspicuity of patchy bilateral pulmonary opacities. IMPRESSION: Decreased conspicuity of bilateral pulmonary opacities. Small right pleural effusion, unchanged. Electronically Signed   By: Primitivo Gauze M.D.   On: 11/28/2020 07:54   DG CHEST PORT 1 VIEW  Result Date: 11/26/2020 CLINICAL DATA:  Short of breath, abnormal chest x-ray, intubated EXAM: PORTABLE CHEST 1 VIEW COMPARISON:  11/26/2020 at 5:35 p.m. FINDINGS: Single frontal view of the chest demonstrates endotracheal tube overlying tracheal air column, tip just below thoracic inlet. Enteric catheter passes below diaphragm, side port projecting at the gastroesophageal junction. The tip is excluded by collimation. Cardiac silhouette remains enlarged. Bilateral upper lobe predominant perihilar airspace disease and right pleural effusion are unchanged. No pneumothorax. IMPRESSION: 1. Support devices as above. 2. Stable pulmonary edema and right pleural effusion. Electronically Signed   By: Randa Ngo M.D.   On: 11/26/2020 21:00   DG CHEST PORT 1 VIEW  Result Date: 11/26/2020 CLINICAL DATA:   Endotracheal tube placement EXAM: PORTABLE CHEST 1 VIEW COMPARISON:  X-ray earlier in the same day. FINDINGS: The endotracheal tube terminates above the carina by approximately 6.6 cm. Diffuse hazy bilateral airspace opacities are again noted, worse in the upper lobes. The heart size remains enlarged. There are bilateral pleural effusions, right greater than left. There is no pneumothorax. IMPRESSION: 1. Lines and tubes as above. 2. Persistent bilateral pleural effusions with persistent multifocal airspace opacities, improved from earlier the same day. Electronically Signed   By: Constance Holster M.D.   On: 11/26/2020 18:45   DG CHEST PORT 1 VIEW  Result Date: 11/26/2020 CLINICAL DATA:  Acute onset shortness of breath. EXAM: PORTABLE CHEST 1 VIEW COMPARISON:  Single-view of the chest 11/24/2020 and 11/26/2020. FINDINGS: Extensive bilateral airspace disease is not notably changed in appearance compared to yesterday's examination. There are likely small to moderate bilateral pleural effusions. Cardiomegaly. Aortic atherosclerosis. No pneumothorax. IMPRESSION: No marked change in extensive bilateral airspace disease and  likely pleural effusions. Findings could be due to multifocal pneumonia and/or congestive failure. Cardiomegaly. Electronically Signed   By: Inge Rise M.D.   On: 11/26/2020 14:55   DG CHEST PORT 1 VIEW  Result Date: 11/26/2020 CLINICAL DATA:  Hypoxia. EXAM: PORTABLE CHEST 1 VIEW COMPARISON:  11/24/2020 FINDINGS: Stable cardiomegaly. Worsening symmetric bilateral pulmonary airspace disease is seen, with central perihilar predominance, highly suspicious for pulmonary edema. Small right pleural effusion shows is significant change. The right pleural pigtail catheter has been removed since prior study, however no pneumothorax is visualized. IMPRESSION: Worsening symmetric bilateral pulmonary airspace disease, highly suspicious for pulmonary edema. Stable small right pleural effusion and  cardiomegaly. Electronically Signed   By: Marlaine Hind M.D.   On: 11/26/2020 08:22   DG Chest Port 1 View  Result Date: 11/24/2020 CLINICAL DATA:  Chest tube.  Follow-up pleural effusion. EXAM: PORTABLE CHEST 1 VIEW COMPARISON:  11/22/2020 FINDINGS: Pigtailed chest tube on the right has partially pulled out in there appear to be side holes outside of the chest. Small right pleural effusion and right lower lobe airspace disease unchanged. No pneumothorax Feeding tube enters the stomach with the tip not visualized. Right jugular central venous catheter tip in the right atrium unchanged. Left subclavian central venous catheter tip in the left innominate vein unchanged. Progressive left lower lobe consolidation without significant pleural effusion. Pulmonary vascular congestion appears to have progressed. IMPRESSION: Right basilar pigtail chest tube is partially pulled out of the chest. No pneumothorax Progressive bilateral airspace disease likely due to edema. Progressive left lower lobe consolidation. Electronically Signed   By: Franchot Gallo M.D.   On: 11/24/2020 09:00   DG Chest Port 1 View  Result Date: 11/22/2020 CLINICAL DATA:  Status post chest tube placement EXAM: PORTABLE CHEST 1 VIEW COMPARISON:  11/21/2020 FINDINGS: Cardiac shadow remains enlarged in size. Left subclavian and right jugular central lines are again seen and stable. Feeding catheter extends into the stomach. Right-sided pigtail thoracostomy tube is noted. No pneumothorax is seen. Mild right basilar atelectasis is again noted. IMPRESSION: No evidence of pneumothorax. Mild right basilar atelectasis. Electronically Signed   By: Inez Catalina M.D.   On: 11/22/2020 08:05   DG Chest Port 1 View  Result Date: 11/21/2020 CLINICAL DATA:  Status post right pigtail thoracostomy tube placement for pleural effusion. EXAM: PORTABLE CHEST 1 VIEW COMPARISON:  Film earlier today at 0544 hours FINDINGS: Interval placement of right lateral pigtail  thoracostomy tube with drainage of right pleural fluid and improved aeration of the right lung with some residual atelectasis in the right lower lung. No pneumothorax. Temporary non tunneled dialysis catheter, left subclavian central line and feeding tube position stable. Stable cardiac enlargement. No left pleural fluid identified. IMPRESSION: No pneumothorax after placement of right lateral pigtail thoracostomy tube with improved aeration of the right lung after evacuation of right pleural fluid. Electronically Signed   By: Aletta Edouard M.D.   On: 11/21/2020 16:27   DG CHEST PORT 1 VIEW  Result Date: 11/20/2020 CLINICAL DATA:  Hypoxia. EXAM: PORTABLE CHEST 1 VIEW COMPARISON:  11/19/2020 FINDINGS: 0625 hours. The cardio pericardial silhouette is enlarged. There is pulmonary vascular congestion without overt pulmonary edema. Interval improvement in aeration at the right base with persistent right base collapse/consolidative opacity and pleural fluid. The right pleural drain seen previously has migrated inferiorly and intrathoracic positioning cannot be confirmed. A feeding tube passes into the stomach although the distal tip position is not included on the film. Left subclavian central  line is positioned at the innominate vein confluence. Right IJ central line tip overlies the mid to lower right atrium. IMPRESSION: 1. Cardiomegaly with vascular congestion. Interval improvement in aeration at the right base with persistent right base collapse/consolidative opacity and right pleural effusion. 2. Interval migration of right pleural drain, now potentially extra thoracic given the low position. Electronically Signed   By: Misty Stanley M.D.   On: 11/20/2020 06:36   DG Chest Port 1 View  Result Date: 11/19/2020 CLINICAL DATA:  Chest tube. EXAM: PORTABLE CHEST 1 VIEW COMPARISON:  November 18, 2020. FINDINGS: Stable cardiomegaly. Feeding tube is seen entering the stomach. Right internal jugular catheter is  unchanged. Right-sided chest tube is unchanged without pneumothorax. Stable bibasilar atelectasis or infiltrates are noted, with probable small bilateral pleural effusions. Bony thorax is unremarkable. IMPRESSION: Stable support apparatus. Stable bibasilar atelectasis or infiltrates, with probable small bilateral pleural effusions. No pneumothorax is noted. Electronically Signed   By: Marijo Conception M.D.   On: 11/19/2020 10:56   DG CHEST PORT 1 VIEW  Result Date: 11/18/2020 CLINICAL DATA:  Empyema. EXAM: PORTABLE CHEST 1 VIEW COMPARISON:  November 17, 2020. FINDINGS: Stable cardiomegaly. Feeding tube is unchanged in position. Right internal jugular catheter is unchanged. Left subclavian catheter is unchanged. Stable position of right-sided chest tube without pneumothorax. Mild bibasilar atelectasis or infiltrates are noted. Bony thorax is unremarkable. IMPRESSION: Stable position of right-sided chest tube without pneumothorax. Mild bibasilar atelectasis or infiltrates are noted. Electronically Signed   By: Marijo Conception M.D.   On: 11/18/2020 08:34   DG Chest Port 1 View  Result Date: 11/17/2020 CLINICAL DATA:  Chest tube.  Abnormal respirations. EXAM: PORTABLE CHEST 1 VIEW COMPARISON:  11/16/2020. FINDINGS: Right chest tube in stable position. Interim near complete resolution of right pneumothorax with miniscule right apical residual. Feeding tube, right IJ line, and left subclavian line stable position. Stable severe cardiomegaly. Pulmonary venous congestion progressive bibasilar atelectasis and infiltrates/edema. Small right pleural effusion. Stable left apical pleural thickening. Left costophrenic angle incompletely imaged. IMPRESSION: 1. Right chest tube in stable position. Interim near complete resolution of right pneumothorax with miniscule right apical residual. 2. Feeding tube, right IJ line, and left subclavian line stable position. 3. Progressive bibasilar atelectasis and infiltrates/edema.  Small right pleural effusion. 4. Stable severe cardiomegaly. Pulmonary venous congestion. A component of CHF may be present. Electronically Signed   By: Marcello Moores  Register   On: 11/17/2020 07:49   DG CHEST PORT 1 VIEW  Result Date: 11/16/2020 CLINICAL DATA:  End-stage renal disease.  Chest tube placement. EXAM: PORTABLE CHEST 1 VIEW COMPARISON:  Multiple exams, including 11/08/2020 and 11/14/2020 FINDINGS: Substantial reduction in the right pleural density favoring successful drainage of pleural effusion, status post right pleural pigtail catheter placement. 5% right pneumothorax. Right internal jugular dialysis catheter tip: Right atrium. Left central line tip: Brachiocephalic confluence. Feeding tube catheter enters the stomach. Atherosclerotic calcification of the aortic arch. Moderate enlargement of the cardiopericardial silhouette Mild atelectasis in both lower lobes. Suspected small left pleural effusion. IMPRESSION: 1. Substantial reduction in the right pleural density favoring successful drainage of pleural effusion, status post right pleural pigtail catheter placement. 5% right pneumothorax. 2. Moderately enlarged cardiopericardial silhouette. 3. Small left pleural effusion with mild bibasilar atelectasis. Electronically Signed   By: Van Clines M.D.   On: 11/16/2020 16:36   DG Swallowing Func-Speech Pathology  Result Date: 11/14/2020 Objective Swallowing Evaluation: Type of Study: MBS-Modified Barium Swallow Study  Patient Details Name: Prentis  DENY CHEVEZ MRN: [id number] Date of Birth: 04-Apr-1961 Today's Date: 11/14/2020 Time: SLP Start Time (ACUTE ONLY): 1400 -SLP Stop Time (ACUTE ONLY): 1425 SLP Time Calculation (min) (ACUTE ONLY): 25 min Past Medical History: Past Medical History: Diagnosis Date . Acute ST elevation myocardial infarction (STEMI) of anterolateral wall ([place]) 10/28/2020  Culprit lesion, 99% ulcerated mid LAD at D1 -> DES PCI . Anemia of chronic disease 01/30/2015 . Coronary  artery disease involving native coronary artery of native heart with unstable angina pectoris ([place]) 10/28/2020  Acute anterolateral STEMI: 3V CAD: Culprit Lesion = 99% mid LAD lesion (just after D1) - DES PCI; 99% ostial AV groove LCx; & 80% calcified napkin ring proximal RCA lesion with extensive calcification throughout the RCA. Successful PTCA and DES PCI of the LAD crossing D1 -resolute Onyx DES 2.75 mm x 18 mm postdilated to 3.1 mm. With PCI, there . End-stage renal disease on hemodialysis ([place])  . GERD (gastroesophageal reflux disease)   takes Nexium . Hyperlipidemia with target LDL less than 70 10/28/2020 . Hypertension  . Pneumonia   as a child . 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 Sleep apnea   uses c-pap 2 yrs Past Surgical History: Past Surgical History: Procedure Laterality Date . AV FISTULA PLACEMENT Left 12/22/2017  Procedure: REPAIR PSEUDOANEURYSM ARTERIOVENOUS (AV) FISTULA;  Surgeon: [name], MD;  Location: [place];  Service: Vascular;  Laterality: Left; . BASCILIC VEIN TRANSPOSITION Left 01/05/2014  Procedure: LEFT 1ST STAGE BASCILIC VEIN TRANSPOSITION;  Surgeon: Conrad Finley, MD;  Location: [place];  Service: Vascular;  Laterality: Left; . BASCILIC VEIN TRANSPOSITION Left 07/26/2014  Procedure: Left Arm Brachial Vein Transposition Second Stage;  Surgeon: Conrad [place], MD;  Location: [place];  Service: Vascular;  Laterality: Left; . COLONOSCOPY   . COLONOSCOPY N/A 02/02/2015  Procedure: COLONOSCOPY;  Surgeon: [name], MD;  Location: [facility] ENDOSCOPY;  Service: Endoscopy;  Laterality: N/A; . CORONARY STENT INTERVENTION N/A 10/28/2020  Procedure: CORONARY STENT INTERVENTION;  Surgeon: Leonie Man, MD;  Location: [place] CV LAB;  Service: Cardiovascular;  Laterality: N/A; . CORONARY/GRAFT ACUTE MI REVASCULARIZATION N/A 10/28/2020  Procedure: Coronary/Graft Acute MI Revascularization;  Surgeon:  Leonie Man, MD;  Location: [place] CV LAB;  Service: Cardiovascular;  Laterality: N/A; . ESOPHAGOGASTRODUODENOSCOPY Left 01/31/2015  Procedure: ESOPHAGOGASTRODUODENOSCOPY (EGD);  Surgeon: [name], MD;  Location: [facility] ENDOSCOPY;  Service: Endoscopy;  Laterality: Left; . ESOPHAGOGASTRODUODENOSCOPY N/A 11/09/2020  Procedure: ESOPHAGOGASTRODUODENOSCOPY (EGD);  Surgeon: Clarene Essex, MD;  Location: [place];  Service: Endoscopy;  Laterality: N/A; . EVALUATION UNDER ANESTHESIA WITH HEMORRHOIDECTOMY N/A 02/26/2018  Procedure: ANORECTAL EXAM UNDER ANESTHESIA  HEMORRHOIDECTOMY x 2  HEMORRHOIDAL LIGATION AND PEXY;  Surgeon: Abhijay Boston, MD;  Location: [facility];  Service: General;  Laterality: N/A; . GIVENS CAPSULE STUDY N/A 01/31/2015  Procedure: GIVENS CAPSULE STUDY;  Surgeon: [name], MD;  Location[facility] ENDOSCOPY;  Service: Endoscopy;  Laterality: N/A; . LEFT HEART CATH AND CORONARY ANGIOGRAPHY N/A 10/28/2020  Procedure: LEFT HEART CATH AND CORONARY ANGIOGRAPHY;  Surgeon: Leonie Man, MD;  Location: [place] CV LAB;  Service: Cardiovascular;  Laterality: N/A; . MOUTH SURGERY    teeth cleaning . 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 He describes his chest tightness, 7/10, with radiation to his back (between his scapula), he has not exerted. EKG in ED is suggestive of anterolateral ST elevations. In the ED he was given thorazine for hiccups; CXR 10/31/20 indicated New patchy right mid and lower lung infiltrate; CT chest 10/31/20 yielded results including: Extensive multifocal nodular and patchy airspace disease in both lungs with a slight peripheral predominance in the upper lungs. 3.1 cm nodular consolidative opacity in the right upper lobe is cavitated. Imaging features  likely related to multifocal pneumonia. Pt intubated 11/23-12/3/21; gastroenterology consult 11/09/20 with UGI completed and results as follows: Moderately severe reflux and erosive esophagitis  with no bleeding; Hematin (altered blood/coffee-ground-like material) in the gastric antrum, in the cardia, in  the gastric fundus and in the gastric body. Fluid aspiration performed. Probable ischemic duodenitis. Normal third portion of the duodenum. The examination was otherwise normal. BSE generated.  Subjective: patient awake and alert but did not attempt to verbalize, was having difficulty sitting up and was unable to keep head up during study Assessment / Plan / Recommendation CHL IP CLINICAL IMPRESSIONS 11/14/2020 Clinical Impression Patient presents with a moderate oropharyngeal dysphagia with likely impact from current cognitive status and pain leading to difficulty maintaining adequate posture and participation during MBS. Patient kept head in a downward, almost chin tuck posture and was not able to raise head despite tactile and physical cues. Oral delay in transit led to premature spillage into vallecular sinus, and swallow initiation occuring at level of vallecular sinus. With puree bolus, patient exhibited more significant delay in swallow initiation and had mild-mod vallecular residuals post initial swallow. No penetration or aspiration observed and no significant amount of pyriform sinus residuals was observed. UES opening appeared [facility] and without evidence of retrograde movement of boluses. Currently, patient is not safe for full PO's, but this is largely due to his inability to maintain adequate participation combined with his overall weakness. Recommendation is remain NPO but allow for SLP to assess patient toleration of trials of thin liquids, nectar thick liquids, puree solids at bedside. SLP Visit Diagnosis Dysphagia, oropharyngeal phase (R13.12) Attention and concentration deficit following -- Frontal  lobe and executive function deficit following -- Impact on safety and function Risk for inadequate nutrition/hydration;Mild aspiration risk;Moderate aspiration risk   CHL IP TREATMENT RECOMMENDATION 11/14/2020 Treatment Recommendations Therapy as outlined in treatment plan below   Prognosis 11/14/2020 Prognosis for Safe Diet Advancement Good Barriers to Reach Goals -- Barriers/Prognosis Comment -- CHL IP DIET RECOMMENDATION 11/14/2020 SLP Diet Recommendations NPO;Other (Comment) Liquid Administration via -- Medication Administration Via alternative means Compensations -- Postural Changes --   CHL IP OTHER RECOMMENDATIONS 11/14/2020 Recommended Consults -- Oral Care Recommendations Oral care QID Other Recommendations --   CHL IP FOLLOW UP RECOMMENDATIONS 11/14/2020 Follow up Recommendations 24 hour supervision/assistance;Skilled Nursing facility;Inpatient Rehab   CHL IP FREQUENCY AND DURATION 11/14/2020 Speech Therapy Frequency (ACUTE ONLY) min 2x/week Treatment Duration 1 week      CHL IP ORAL PHASE 11/14/2020 Oral Phase -- Oral - Pudding Teaspoon -- Oral - Pudding Cup -- Oral - Honey Teaspoon -- Oral - Honey Cup -- Oral - Nectar Teaspoon Premature spillage;Left anterior bolus loss;Right anterior bolus loss;Weak lingual manipulation;Delayed oral transit Oral - Nectar Cup Delayed oral transit;Weak lingual manipulation;Right anterior bolus loss;Left anterior bolus loss;Premature spillage Oral - Nectar Straw -- Oral - Thin Teaspoon Premature spillage;Weak lingual manipulation;Left anterior bolus loss;Right anterior bolus loss;Delayed oral transit Oral -  Thin Cup -- Oral - Thin Straw -- Oral - Puree Weak lingual manipulation;Delayed oral transit;Premature spillage Oral - Mech Soft -- Oral - Regular -- Oral - Multi-Consistency -- Oral - Pill -- Oral Phase - Comment --  CHL IP PHARYNGEAL PHASE 11/14/2020 Pharyngeal Phase -- Pharyngeal- Pudding Teaspoon -- Pharyngeal -- Pharyngeal- Pudding Cup -- Pharyngeal -- Pharyngeal- Honey  Teaspoon -- Pharyngeal -- Pharyngeal- Honey Cup -- Pharyngeal -- Pharyngeal- Nectar Teaspoon Delayed swallow initiation-vallecula;Pharyngeal residue - valleculae Pharyngeal -- Pharyngeal- Nectar Cup Delayed swallow initiation-vallecula;Pharyngeal residue - valleculae Pharyngeal -- Pharyngeal- Nectar Straw -- Pharyngeal -- Pharyngeal- Thin Teaspoon Delayed swallow initiation-vallecula;Pharyngeal residue - valleculae Pharyngeal -- Pharyngeal- Thin Cup Pharyngeal residue - valleculae;Delayed swallow initiation-vallecula;Reduced tongue base retraction Pharyngeal -- Pharyngeal- Thin Straw -- Pharyngeal -- Pharyngeal- Puree Pharyngeal residue - valleculae;Delayed swallow initiation-vallecula;Reduced tongue base retraction Pharyngeal -- Pharyngeal- Mechanical Soft -- Pharyngeal -- Pharyngeal- Regular -- Pharyngeal -- Pharyngeal- Multi-consistency -- Pharyngeal -- Pharyngeal- Pill -- Pharyngeal -- Pharyngeal Comment --  CHL IP CERVICAL ESOPHAGEAL PHASE 11/14/2020 Cervical Esophageal Phase WFL Pudding Teaspoon -- Pudding Cup -- Honey Teaspoon -- Honey Cup -- Nectar Teaspoon -- Nectar Cup -- Nectar Straw -- Thin Teaspoon -- Thin Cup -- Thin Straw -- Puree -- Mechanical Soft -- Regular -- Multi-consistency -- Pill -- Cervical Esophageal Comment -- Sonia Baller, MA, CCC-SLP Speech [facility]             IR AV DIALY SHUNT INTRO NEEDLE/INTRAC INITIAL W/PTA/STENT/IMG LEFT  Result Date: 11/20/2020 INDICATION: 60 year old male with a history of malfunctioning left upper extremity fistula. 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 A recent duplex approximately 2-3 weeks prior demonstrates a flow volume of 1816 mL per minute, with no evidence stenosis. Given malfunction, he presents for fistulagram EXAM: ULTRASOUND-GUIDED ACCESS LEFT UPPER EXTREMITY FISTULA BALLOON ANGIOPLASTY OF OUTFLOW STENOSES MEDICATIONS: None.  ANESTHESIA/SEDATION: Moderate Sedation Time:  0 minutes The patient was continuously monitored during the procedure by the interventional radiology nurse under my direct supervision. FLUOROSCOPY TIME:  Fluoroscopy Time: 3 minutes 24 seconds (40 mGy). COMPLICATIONS: None PROCEDURE: Informed written consent was obtained from the patient after a thorough discussion of the procedural risks, benefits and alternatives. All questions were addressed. Maximal Sterile Barrier Technique was utilized including caps, mask, sterile gowns, sterile gloves, sterile drape, hand hygiene and skin antiseptic. A timeout was performed prior to the initiation of the procedure. Ultrasound survey was performed with images stored and sent to PACs. The left upper extremity was prepped and draped in the usual sterile fashion. Ultrasound guidance was used to access the left upper extremity graft with a micropuncture kit. Once the 4 French sheath was within the graft, left upper extremity fistulogram was performed. Images were carried out to the central vasculature. Bentson wire was then placed into the outflow and a 6 French sheath was placed. Combination of Glidewire and the Bentson wire and an angled Kumpe the catheter were used to navigate through the tandem stenoses of the venous outflow. Once the Glidewire and the Kumpe the catheter were through the tandem stenoses, the Bentson wire was placed through the catheter and the catheter was removed. Serial standard balloon angioplasty was then performed at the outflow stenoses first 6 mm then 8 mm. Finally 12 mm balloon angioplasty was performed first at the most central of the stenoses (greater than 95%) and then at the more distal. After balloon angioplasty of venous outflow  was performed, balloon was inflated and reflux images of the arterial anastomosis performed. Given the volume measured on the recent duplex of greater than 1800 cc/minute we elected not to puncture in a peripheral directed  access. All catheters wires were removed and a suture was placed at the access point for hemostasis. Sterile bandage was placed. Excellent thrill was confirmed at the conclusion. Patient tolerated the procedure well remained hemodynamically stable throughout. No complications were encountered and no significant blood loss. 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 Both of these were treated to 0% residual with standard balloon angioplasty to 12 mm. Excellent thrill upon completion. IMPRESSION: Status post ultrasound guided access left upper extremity fistula for standard balloon angioplasty treatment of tandem high-grade outflow stenoses, with 0% residual after treatment. Signed, Dulcy Fanny. Dellia Nims, RPVI [facility] Radiology ACCESS: Recommend early duplex evaluation of flow volumes and the arteriovenous anastomosis. This access remains amenable to future percutaneous interventions as clinically indicated. Electronically Signed   By: Corrie Mckusick D.O.   On: 11/20/2020 15:11   VAS US DUPLEX DIALYSIS ACCESS (AVF, AVG)  Result Date: 11/24/2020 DIALYSIS ACCESS Reason for Exam: Follow up s/p ultrasound guided left upper extremity                  fistulagram and treatment of two tandem outflow stenoses. Access Site: Left Upper Extremity. Access Type: Basilic vein transposition. Limitations: sutures Comparison Study: 10/30/2020- AVF duplex Performing Technologist: Maudry Mayhew MHA, RDMS, RVT, RDCS  Examination Guidelines: A complete evaluation includes B-mode imaging, spectral Doppler, color Doppler, and power Doppler as needed of all accessible portions of each vessel. Unilateral testing is considered an integral part of a complete examination. Limited examinations for reoccurring indications may be performed as noted.  Findings:  +--------------------+----------+-----------------+--------+ AVF                 PSV (cm/s)Flow Vol (mL/min)Comments +--------------------+----------+-----------------+--------+ Native artery inflow   114          2190                +--------------------+----------+-----------------+--------+ AVF Anastomosis        352                              +--------------------+----------+-----------------+--------+  +------------+----------+-------------+----------+-----------------------------+ OUTFLOW VEINPSV (cm/s)Diameter (cm)Depth (cm)          Describe            +------------+----------+-------------+----------+-----------------------------+ Prox UA        287                            2.1cm diameter, thrombosed                                                  with 0.8cm residual lumen   +------------+----------+-------------+----------+-----------------------------+ Mid UA         110        2.36                                             +------------+----------+-------------+----------+-----------------------------+  Dist UA        572        2.46                                             +------------+----------+-------------+----------+-----------------------------+ AC Fossa       365                                                         +------------+----------+-------------+----------+-----------------------------+   Summary: 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 Arteriovenous fistula-Stenosis noted at the anastomosis. *See table(s) above for measurements and observations.  Diagnosing physician: Monica Martinez MD Electronically signed by Monica Martinez MD on 11/24/2020 at 5:04:39 PM.    --------------------------------------------------------------------------------   Final    ECHOCARDIOGRAM LIMITED  Result Date: 11/26/2020    ECHOCARDIOGRAM LIMITED REPORT   Patient  Name:   ANANT AGARD Date of Exam: 11/26/2020 Medical Rec #:  [id number]         Height:       71.0 in Accession #:    [id number]        Weight:       176.1 lb Date of Birth:  1961/03/09        BSA:          1.998 m Patient Age:    28 years          BP:           113/72 mmHg Patient Gender: M                 HR:           107 bpm. Exam Location:  Inpatient Procedure: 2D Echo STAT ECHO Indications:     abnormal heart sounds  History:         Patient has prior history of Echocardiogram examinations, most                  recent 11/13/2020. CAD, end stage renal disease., mitral                  regurgitation, Arrythmias:Atrial Fibrillation; Risk                  Factors:Dyslipidemia and Hypertension.  Sonographer:     Johny Chess Referring Phys:  Theodore Diagnosing Phys: Eleonore Chiquito MD IMPRESSIONS  1. Left ventricular ejection fraction, by estimation, is 45 to 50%. The left ventricle has mildly decreased function. The left ventricle demonstrates regional wall motion abnormalities (see scoring diagram/findings for description). There is moderate concentric left ventricular hypertrophy. Indeterminate diastolic filling due to E-A fusion.  2. Right ventricular systolic function is normal. The right ventricular size is normal. Tricuspid regurgitation signal is inadequate for assessing PA pressure.  3. There is mild to moderate MR due to restricted PMVL due to mitral annular calcification (IIIB). MR is slightly worse than the prior study, but does not appear to be severe. The mitral valve is grossly normal. Mild to moderate mitral valve regurgitation. No evidence of mitral stenosis. Moderate mitral annular calcification.  4. The aortic valve is tricuspid. There is mild calcification of the aortic  valve. There is mild thickening of the aortic valve. Aortic valve regurgitation is trivial. Mild to moderate aortic valve sclerosis/calcification is present, without any evidence of aortic stenosis.  5. The  inferior vena cava is dilated in size with >50% respiratory variability, suggesting right atrial pressure of 8 mmHg. Comparison(s): No significant change from prior study. FINDINGS  Left Ventricle: Left ventricular ejection fraction, by estimation, is 45 to 50%. The left ventricle has mildly decreased function. The left ventricle demonstrates regional wall motion abnormalities. The left ventricular internal cavity size was normal in size. There is moderate concentric left ventricular hypertrophy. Indeterminate diastolic filling due to E-A fusion.  LV Wall Scoring: The mid inferoseptal segment, apical septal segment, and apex are hypokinetic. Right Ventricle: The right ventricular size is normal. No increase in right ventricular wall thickness. Right ventricular systolic function is normal. Tricuspid regurgitation signal is inadequate for assessing PA pressure. Mitral Valve: There is mild to moderate MR due to restricted PMVL due to mitral annular calcification (IIIB). MR is slightly worse than the prior study, but does not appear to be severe. The mitral valve is grossly normal. Moderate mitral annular calcification. Mild to moderate mitral valve regurgitation, with posteriorly-directed jet. No evidence of mitral valve stenosis. Tricuspid Valve: The tricuspid valve is grossly normal. Tricuspid valve regurgitation is trivial. No evidence of tricuspid stenosis. Aortic Valve: The aortic valve is tricuspid. There is mild calcification of the aortic valve. There is mild thickening of the aortic valve. Aortic valve regurgitation is trivial. Mild to moderate aortic valve sclerosis/calcification is present, without any evidence of aortic stenosis. Aortic valve mean gradient measures 12.7 mmHg. Aortic valve peak gradient measures 22.8 mmHg. Aortic valve area, by VTI measures 2.82 cm. Pulmonic Valve: The pulmonic valve was grossly normal. Pulmonic valve regurgitation is not visualized. No evidence of pulmonic stenosis.  Aorta: The aortic root and ascending aorta are structurally normal, with no evidence of dilitation. Venous: The inferior vena cava is dilated in size with greater than 50% respiratory variability, suggesting right atrial pressure of 8 mmHg. Additional Comments: There is a small pleural effusion in the left lateral region. LEFT VENTRICLE PLAX 2D LVIDd:         5.80 cm LVIDs:         4.60 cm LV PW:         1.50 cm LV IVS:        1.30 cm LVOT diam:     2.20 cm LV SV:         109 LV SV Index:   55 LVOT Area:     3.80 cm  LV Volumes (MOD) LV vol d, MOD A4C: 169.0 ml LV vol s, MOD A4C: 104.0 ml LV SV MOD A4C:     169.0 ml IVC IVC diam: 2.30 cm LEFT ATRIUM         Index LA diam:    5.50 cm 2.75 cm/m  AORTIC VALVE AV Area (Vmax):    2.42 cm AV Area (Vmean):   2.50 cm AV Area (VTI):     2.82 cm AV Vmax:           238.90 cm/s AV Vmean:          168.751 cm/s AV VTI:            0.388 m AV Peak Grad:      22.8 mmHg AV Mean Grad:      12.7 mmHg LVOT Vmax:         152.00  cm/s LVOT Vmean:        111.000 cm/s LVOT VTI:          0.288 m LVOT/AV VTI ratio: 0.74  AORTA Ao Root diam: 3.50 cm Ao Asc diam:  3.80 cm  SHUNTS Systemic VTI:  0.29 m Systemic Diam: 2.20 cm Eleonore Chiquito MD Electronically signed by Eleonore Chiquito MD Signature Date/Time: 11/26/2020/12:08:55 PM    Final (Updated)       Discharge Exam: Vitals:   12/14/20 0355 12/14/20 0731  BP: 101/64 111/79  Pulse: 90   Resp: 20 (!) 22  Temp: 99 F (37.2 C) 98.2 F (36.8 C)  SpO2: 100%    Vitals:   12/13/20 2320 12/14/20 0355 12/14/20 0411 12/14/20 0731  BP: 95/61 101/64  111/79  Pulse: 86 90    Resp: 20 20  (!) 22  Temp: 99 F (37.2 C) 99 F (37.2 C)  98.2 F (36.8 C)  TempSrc: Oral Oral  Axillary  SpO2: 97% 100%    Weight:   73 kg   Height:        General: Pt is alert, awake, not in acute distress Cardiovascular: RRR, S1/S2 +, no rubs, no gallops Respiratory: CTA bilaterally, no wheezing, no rhonchi Abdominal: Soft, NT, ND, bowel sounds  + Extremities: no edema, no cyanosis    The results of significant diagnostics from this hospitalization (including imaging, microbiology, ancillary and laboratory) are listed below for reference.     Microbiology: Recent Results (from the past 240 hour(s))  Culture, blood (Routine X 2) w Reflex to ID Panel     Status: None   Collection Time: 12/09/20 10:16 AM   Specimen: BLOOD  Result Value Ref Range Status   Specimen Description BLOOD RIGHT ANTECUBITAL  Final   Special Requests   Final    BOTTLES DRAWN AEROBIC AND ANAEROBIC Blood Culture results may not be optimal due to an inadequate volume of blood received in culture bottles   Culture   Final    NO GROWTH 5 DAYS Performed at [facility], [place] [address] [place], [place] [address]    Report Status 12/14/2020 FINAL  Final  MRSA PCR Screening     Status: None   Collection Time: 12/11/20  3:48 PM   Specimen: Nasal Mucosa; Nasopharyngeal  Result Value Ref Range Status   MRSA by PCR NEGATIVE NEGATIVE Final    Comment:        The GeneXpert MRSA Assay (FDA approved for NASAL specimens only), is one component of a comprehensive MRSA colonization surveillance program. It is not intended to diagnose MRSA infection nor to guide or monitor treatment for MRSA infections. Performed at [facility], [place] [address], [place], [place] [address]      Labs: BNP (last 3 results) No results for input(s): BNP in the last 8760 hours. Basic Metabolic Panel: Recent Labs  Lab 12/08/20 0516 12/09/20 0103 12/11/20 0617 12/13/20 0842  NA 135 137 134* 137  K 3.9 3.6 3.5 3.8  CL 95* 94* 97* 103  CO2 26 29 26 22   GLUCOSE 107* 111* 88 108*  BUN 89* 38* 72* 71*  CREATININE 8.32* 5.30* 7.16* 7.27*  CALCIUM 10.5* 10.3 10.1 10.4*  PHOS 5.2*  --  5.6* 4.3   Liver Function Tests: Recent Labs  Lab 12/08/20 0516 12/09/20 0103 12/11/20 0617 12/13/20 0842  AST  --  14*  --   --   ALT  --  5  --   --  ALKPHOS  --  106  --    --   BILITOT  --  0.6  --   --   PROT  --  6.7  --   --   ALBUMIN 1.9* 2.0* 2.0* 2.3*   No results for input(s): LIPASE, AMYLASE in the last 168 hours. Recent Labs  Lab 12/08/20 1716  AMMONIA 15   CBC: Recent Labs  Lab 12/08/20 0516 12/09/20 0103 12/10/20 0101 12/11/20 0617 12/13/20 0842  WBC 11.7* 14.8* 12.8* 12.3* 11.8*  HGB 8.0* 8.3* 7.8* 7.4* 7.8*  HCT 24.8* 27.2* 26.1* 24.3* 25.9*  MCV 90.8 91.3 92.6 92.0 93.8  PLT 353 387 330 321 339   Cardiac Enzymes: No results for input(s): CKTOTAL, CKMB, CKMBINDEX, TROPONINI in the last 168 hours. BNP: Invalid input(s): POCBNP CBG: Recent Labs  Lab 12/09/20 1949 12/09/20 2344 12/10/20 0320 12/10/20 0803 12/10/20 1602  GLUCAP 90 102* 98 87 91   D-Dimer No results for input(s): DDIMER in the last 72 hours. Hgb A1c No results for input(s): HGBA1C in the last 72 hours. Lipid Profile No results for input(s): CHOL, HDL, LDLCALC, TRIG, CHOLHDL, LDLDIRECT in the last 72 hours. Thyroid function studies No results for input(s): TSH, T4TOTAL, T3FREE, THYROIDAB in the last 72 hours.  Invalid input(s): FREET3 Anemia work up No results for input(s): VITAMINB12, FOLATE, FERRITIN, TIBC, IRON, RETICCTPCT in the last 72 hours. Urinalysis    Component Value Date/Time   COLORURINE YELLOW 11/13/2012 1335   APPEARANCEUR CLEAR 11/13/2012 1335   LABSPEC 1.015 11/13/2012 1335   PHURINE 5.5 11/13/2012 1335   GLUCOSEU NEGATIVE 11/13/2012 1335   HGBUR SMALL (A) 11/13/2012 1335   BILIRUBINUR NEGATIVE 11/13/2012 1335   KETONESUR NEGATIVE 11/13/2012 1335   PROTEINUR >300 (A) 11/13/2012 1335   UROBILINOGEN 0.2 11/13/2012 1335   NITRITE NEGATIVE 11/13/2012 1335   LEUKOCYTESUR NEGATIVE 11/13/2012 1335   Sepsis Labs Invalid input(s): PROCALCITONIN,  WBC,  LACTICIDVEN Microbiology Recent Results (from the past 240 hour(s))  Culture, blood (Routine X 2) w Reflex to ID Panel     Status: None   Collection Time: 12/09/20 10:16 AM    Specimen: BLOOD  Result Value Ref Range Status   Specimen Description BLOOD RIGHT ANTECUBITAL  Final   Special Requests   Final    BOTTLES DRAWN AEROBIC AND ANAEROBIC Blood Culture results may not be optimal due to an inadequate volume of blood received in culture bottles   Culture   Final    NO GROWTH 5 DAYS Performed at [facility], [place] [address], [place], [place] [address]    Report Status 12/14/2020 FINAL  Final  MRSA PCR Screening     Status: None   Collection Time: 12/11/20  3:48 PM   Specimen: Nasal Mucosa; Nasopharyngeal  Result Value Ref Range Status   MRSA by PCR NEGATIVE NEGATIVE Final    Comment:        The GeneXpert MRSA Assay (FDA approved for NASAL specimens only), is one component of a comprehensive MRSA colonization surveillance program. It is not intended to diagnose MRSA infection nor to guide or monitor treatment for MRSA infections. Performed at [facility], [place] [address], [place], [place] [address]      Time coordinating discharge: Over 30 minutes  SIGNED:   Darliss Cheney, MD  [facility] 12/14/2020, 10:48 AM  If 7PM-7AM, please contact night-coverage [email or web address]

## 2020-12-14 NOTE — Progress Notes (Addendum)
Renal Navigator received message from message from Dr. Joelyn Oms that patient and wife have questions about transportation now that it has been determined that he has surpassed need for CIR and the recommendation is home with [facility].  Renal Navigator spoke with patient's wife, who states they are interested in [facility] transportation when patient's wound heals more, so not immediately. She states she will transport him to HD at this point. Since transportation is not needed immediately, this application can be done from the clinic in the future when needed. Patient's wife states that more than transportation, her husband is actually interested in switching back to the clinic that he started at, which is [place], in order to be closer to home, to help with the commute. Navigator informed patient's wife that at this time, [place] is closed to admissions, so it may not be possible, but that Navigator is happy to check on this. Navigator spoke with [employer], who state they will evaluate and get back to Navigator.  Renal Navigator then received message from Attending that patient will be discharged today. The transfer to a different HD clinic can occur after discharge and does not need to cause a delay. He will return to [facility] tomorrow and the clinics can be in touch with each other regarding the possibility of a transfer at a later date. Navigator called patient's wife back to explain, but had to leave a message. Navigator requested a call back to confirm her receipt of the information. Navigator also let each clinic know. No barriers to discharge from an outpatient HD standpoint. UPDATE: Wife has called back and is aware and in agreement of above. She states appreciation of assistance from Navigator.  Alphonzo Cruise, [place] Renal Navigator [phone]

## 2020-12-14 NOTE — Plan of Care (Signed)
  Problem: Education: Goal: Understanding of CV disease, CV risk reduction, and recovery process will improve Outcome: Progressing   Problem: Cardiovascular: Goal: Ability to achieve and maintain adequate cardiovascular perfusion will improve Outcome: Progressing   Problem: Clinical Measurements: Goal: Ability to maintain clinical measurements within normal limits will improve Outcome: Progressing Goal: Will remain free from infection Outcome: Progressing Goal: Cardiovascular complication will be avoided Outcome: Progressing   Problem: Activity: Goal: Risk for activity intolerance will decrease Outcome: Progressing

## 2020-12-14 NOTE — Progress Notes (Signed)
Physical Therapy Treatment Patient Details Name: Travis [name] MRN: [id number] DOB: Jun 07, 1961 Today's Date: 12/14/2020    History of Present Illness Pt is 60 y.o. male with past medical history of end-stage renal disease on hemodialysis, essential hypertension, dilated ascending aorta and hyperlipidemia who presented on November 20 with anterior ST elevation myocardial infarction.  VDRF 11/23-12/3.  Stent placed 11/20.  Afib with RVR with cardioversion 11/21. Suspected cholecystolithiasis 11/29. Respiratory failure and reintubated 11/26/20.    PT Comments    Pt is sitting up in recliner with weight shifted off his sacral wound. Pt with some disappointment that he is not going to CIR. Educated to the fact that he has surpassed that level of rehab need. Focus of session on gait training, balance, and strengthening as well as education. Pt is making good progress towards his goals and will continue to benefit from HHPT at discharge to improve his strength and endurance. Pt should discharge in the next 2 days however PT will continue to see him acutely until he discharges.     Follow Up Recommendations  Home health PT;Supervision/Assistance - 24 hour     Equipment Recommendations  Rolling walker with 5" wheels;Other (comment) (Rojo cushion for sacral weight redistribution)       Precautions / Restrictions Precautions Precautions: Fall Precaution Comments: O2 Restrictions Weight Bearing Restrictions: No Other Position/Activity Restrictions: watch BP and O2    Mobility  Bed Mobility Overal bed mobility: Needs Assistance Bed Mobility: Rolling;Sidelying to Sit Rolling: Supervision         General bed mobility comments: supervision for safety  Transfers Overall transfer level: Needs assistance Equipment used: Rolling walker (2 wheeled) Transfers: Sit to/from Stand Sit to Stand: Supervision         General transfer comment: suprvision for safety, good power up and  steadying  Ambulation/Gait Ambulation/Gait assistance: Min guard;Min assist Gait Distance (Feet): 450 Feet Assistive device: Rolling walker (2 wheeled);None Gait Pattern/deviations: Decreased stride length;Step-through pattern;Narrow base of support Gait velocity: decreased Gait velocity interpretation: <1.8 ft/sec, indicate of risk for recurrent falls General Gait Details: initiated ambulation with use of RW and min guard for safety. trialed ambulation without AD, cuing for increased BoS, 2x LoB requiring min A for steadying         Balance Overall balance assessment: Needs assistance Sitting-balance support: No upper extremity supported;Feet supported Sitting balance-Leahy Scale: Fair   Postural control:  (sitting reclined in recliner with weight shifted off of sacral wound) Standing balance support: Bilateral upper extremity supported Standing balance-Leahy Scale: Fair Standing balance comment: able to perform static standing without assist                            Cognition Arousal/Alertness: Awake/alert Behavior During Therapy: WFL for tasks assessed/performed Overall Cognitive Status: Within Functional Limits for tasks assessed                                        Exercises Total Joint Exercises Hip ABduction/ADduction: AROM;Both;10 reps;Standing Knee Flexion: AROM;Both;10 reps;Standing Standing Hip Extension: AROM;Both;10 reps;Standing General Exercises - Lower Extremity Hip Flexion/Marching: AROM;Both;10 reps;Standing Mini-Sqauts: AROM;Both;10 reps;Standing    General Comments General comments (skin integrity, edema, etc.): VSS on RA      Pertinent Vitals/Pain Pain Assessment: Faces Faces Pain Scale: Hurts a little bit Pain Location: sacral wound Pain Descriptors / Indicators: Discomfort;Sore Pain  Intervention(s): Limited activity within patient's tolerance;Monitored during session;Repositioned           PT Goals (current  goals can now be found in the care plan section) Acute Rehab PT Goals Patient Stated Goal: Move better and get stronger PT Goal Formulation: With patient/family Time For Goal Achievement: 12/18/20 Potential to Achieve Goals: Good Progress towards PT goals: Progressing toward goals    Frequency    Min 3X/week      PT Plan Discharge plan needs to be updated       AM-PAC PT "6 Clicks" Mobility   Outcome Measure  Help needed turning from your back to your side while in a flat bed without using bedrails?: None Help needed moving from lying on your back to sitting on the side of a flat bed without using bedrails?: A Little Help needed moving to and from a bed to a chair (including a wheelchair)?: A Little Help needed standing up from a chair using your arms (e.g., wheelchair or bedside chair)?: A Little Help needed to walk in hospital room?: A Little Help needed climbing 3-5 steps with a railing? : A Lot 6 Click Score: 18    End of Session Equipment Utilized During Treatment: Gait belt Activity Tolerance: Patient tolerated treatment well Patient left: with call bell/phone within reach;in chair Nurse Communication: Mobility status PT Visit Diagnosis: Muscle weakness (generalized) (M62.81)     Time: 2820-6015 PT Time Calculation (min) (ACUTE ONLY): 21 min  Charges:  $Gait Training: 8-22 mins                     Tamila Gaulin B. Migdalia Dk PT, DPT Acute Rehabilitation Services Pager [phone] Office [phone]    [place] 12/14/2020, 11:04 AM

## 2020-12-14 NOTE — Progress Notes (Signed)
Discussed MI, Brilinta, exercise guidelines, and angina sx with pt and his sis-in-law. Very receptive and very talkative, sharing what he has learned through his experience.  [place], ACSM 1:44 PM 12/14/2020

## 2020-12-14 NOTE — Progress Notes (Signed)
Patient ID: Travis [name], male   DOB: 1961-06-02, 60 y.o.   MRN: [id number]  [facility] Progress Note   Assessment/ Plan:   1. Resolved Acute hypoxic respiratory failure: Like pulm edema, now on RA / 2L [place] 2. AMS: Resolved now, needs 4h HD  3. Afib / RVR - getting po metoprolol and amio po. Stable in NSR 4. ESRD: on HD MWF schedule. SP CRRT course x 2. Heparin not being used due to significant ABLA/GI bleed recently.  RX here: 4h, added K, AVF, Qb 450, no heparin, try in chair.  Need to sort out transporation with HD 5. Volume/BP: weights re down, BPs stable on midodrine 5 mg tid for now. Standing pre/post weights 6. Anemia: Multifactorial with acute illness and now likely complicated by malnutrition/inflammatory with sacral decubitus. Hb's still low in 7-8 range,aranesp 100 ug q Monday, w/ as needed iron.   7. CKD-MBD: Calcium and phosphorus level currently stable, Sevelamer qAC 8. Nutrition: per primary, push protein 9. History of acute STEMI: admit diagnosis, sp PCI to LAD on 10/2019.  On Brilinta due to multifocal/stable disease. 10. Sacral decubitus (unstageable): Wound care with hydrotherapy and pressure alleviating devices. Maintain optimization of nutritional status to help wound healing. PT/OT 11. Bilat MSSA cavitary PNA w/ empyema: rx'd w/ abx and chest tubes.   Rexene Agent , MD 12/14/2020, 9:40 AM    Subjective:     Seen in SDU. Doing well, plan is for [facility], not CIR Wife and pt with several questions about woudn care, transitions and transportation, tried to address  Motivated, good spirits  Objective:   BP 111/79 (BP Location: Right Arm)   Pulse 90   Temp 98.2 F (36.8 C) (Axillary)   Resp (!) 22   Ht 5\' 11"  (1.803 m)   Wt 73 kg   SpO2 100%   BMI 22.45 kg/m   Physical Exam: Gen: Awake, alert CVS: RRR no RG Resp: lungs clear bilat no rales Abd: Soft, nondistended, bowel sounds normal Ext: no edema LUA AVF+bruit  OP HD: AF MWF   4h   450/800  81.5kg  2/2.25 bath  RUE AVF  Hep none, on hold for inpt GI bleeding issues (was on 5000u bolus+ 1000 midrun)   - hect 5 ug tiw   -  mircera 50 ug q 4, last 11/15 (due 11/29)  CXR 12/31 - IMPRESSION: 1. Improved aeration of both lungs compared to yesterday's chest x-ray. Probable small bilateral pleural effusions. 2. Stable cardiomegaly.   Labs: BMET Recent Labs  Lab 12/08/20 0516 12/09/20 0103 12/11/20 0617 12/13/20 0842  NA 135 137 134* 137  K 3.9 3.6 3.5 3.8  CL 95* 94* 97* 103  CO2 26 29 26 22   GLUCOSE 107* 111* 88 108*  BUN 89* 38* 72* 71*  CREATININE 8.32* 5.30* 7.16* 7.27*  CALCIUM 10.5* 10.3 10.1 10.4*  PHOS 5.2*  --  5.6* 4.3   CBC Recent Labs  Lab 12/09/20 0103 12/10/20 0101 12/11/20 0617 12/13/20 0842  WBC 14.8* 12.8* 12.3* 11.8*  HGB 8.3* 7.8* 7.4* 7.8*  HCT 27.2* 26.1* 24.3* 25.9*  MCV 91.3 92.6 92.0 93.8  PLT 387 330 321 339      Medications:    . (feeding supplement) PROSource Plus  30 mL Oral TID PC & HS  . amiodarone  200 mg Oral BID  . atorvastatin  80 mg Oral q1800  . Chlorhexidine Gluconate Cloth  6 each Topical Q0600  . darbepoetin (ARANESP) injection -  DIALYSIS  100 mcg Intravenous Q Mon-HD  . docusate sodium  100 mg Oral BID  . heparin injection (subcutaneous)  5,000 Units Subcutaneous Q8H  . lactobacillus acidophilus  1 tablet Oral TID  . mouth rinse  15 mL Mouth Rinse BID  . melatonin  5 mg Oral QHS  . metoprolol tartrate  37.5 mg Oral BID  . midodrine  5 mg Oral TID WC  . multivitamin  1 tablet Oral QHS  . nutrition supplement (JUVEN)  1 packet Oral BID BM  . pantoprazole  40 mg Oral BID  . polyethylene glycol  17 g Oral Daily  . sevelamer carbonate  1.6 g Oral TID WC  . sodium chloride flush  3 mL Intravenous Q12H  . ticagrelor  90 mg Oral BID

## 2020-12-14 NOTE — Consult Note (Signed)
[place] Nurse wound follow up Patient receiving care in [facility] 2C08.  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 Undermining from 12 - 2 of 2 cm, no other undermining found Wound bed: 100% pink granulation tissue Drainage (amount, consistency, odor) moist gauze packing removed from wound, no malodorous drainage or odor detected. Periwound: intact Dressing procedure/placement/frequency: Continue BID saline moistened gauze dressing changes. At the conclusion of my assessment the patient had several questions related to continued care of the wound.  For example, he wanted to know how twice daily dressing changes would be done if he goes home. We talked about what Home Health might be able to do.  I explained I do not speak for any [place] agency, but I know they do not send a nurse out to do twice daily dressing changes.  I explained that if he is discharged home, arrangements could be made for him to be seen at the [facility] close to [facility].  And, we talked about if he is discharged to our [place], the [facility] [place] nurses do see patients in our own rehab area.  He prefers to continue to have a [place] evaluating the wound periodically. The patient stated this would be his preference, but he also wants to speak with his wife.  As for the sacral wound, it continues to fill in, and be stable with the twice daily saline gauze dressing that is currently ordered.   Monitor the wound area(s) for worsening of condition such as: Signs/symptoms of infection,  Increase in size,  Development of or worsening of odor, Development of pain, or increased pain at the affected locations.  Notify the medical team if any of these develop. Val Riles, RN, MSN, CWOCN, CNS-BC, pager [phone]

## 2020-12-15 DIAGNOSIS — N2581 Secondary hyperparathyroidism of renal origin: Secondary | ICD-10-CM | POA: Diagnosis not present

## 2020-12-15 DIAGNOSIS — N186 End stage renal disease: Secondary | ICD-10-CM | POA: Diagnosis not present

## 2020-12-15 DIAGNOSIS — Z992 Dependence on renal dialysis: Secondary | ICD-10-CM | POA: Diagnosis not present

## 2020-12-15 DIAGNOSIS — J15211 Pneumonia due to Methicillin susceptible Staphylococcus aureus: Secondary | ICD-10-CM | POA: Diagnosis not present

## 2020-12-18 ENCOUNTER — Telehealth (HOSPITAL_COMMUNITY): Payer: Self-pay

## 2020-12-18 NOTE — Telephone Encounter (Signed)
Called patient to see if he is interested in the Cardiac Rehab Program. Patient expressed interest. Explained scheduling process and went over insurance, patient verbalized understanding. Will contact patient for scheduling once f/u has been completed.  °

## 2020-12-18 NOTE — Telephone Encounter (Signed)
Pt insurance is active and benefits verified through Medicare A/B. Co-pay $0.00, DED $233.00/$0.00 met, out of pocket $0.00/$0.00 met, co-insurance 20%. No pre-authorization required. Passport, 12/18/20 @ 4:01PM, IQN#[id number]  Will contact patient to see if he is interested in the Cardiac Rehab Program. If interested, patient will need to complete follow up appt. Once completed, patient will be contacted for scheduling upon review by the RN Navigator.

## 2020-12-19 DIAGNOSIS — D631 Anemia in chronic kidney disease: Secondary | ICD-10-CM | POA: Diagnosis not present

## 2020-12-19 DIAGNOSIS — N2581 Secondary hyperparathyroidism of renal origin: Secondary | ICD-10-CM | POA: Diagnosis not present

## 2020-12-19 DIAGNOSIS — N186 End stage renal disease: Secondary | ICD-10-CM | POA: Diagnosis not present

## 2020-12-19 DIAGNOSIS — Z992 Dependence on renal dialysis: Secondary | ICD-10-CM | POA: Diagnosis not present

## 2020-12-21 DIAGNOSIS — N2581 Secondary hyperparathyroidism of renal origin: Secondary | ICD-10-CM | POA: Diagnosis not present

## 2020-12-21 DIAGNOSIS — D631 Anemia in chronic kidney disease: Secondary | ICD-10-CM | POA: Diagnosis not present

## 2020-12-21 DIAGNOSIS — N186 End stage renal disease: Secondary | ICD-10-CM | POA: Diagnosis not present

## 2020-12-21 DIAGNOSIS — Z992 Dependence on renal dialysis: Secondary | ICD-10-CM | POA: Diagnosis not present

## 2020-12-23 DIAGNOSIS — D631 Anemia in chronic kidney disease: Secondary | ICD-10-CM | POA: Diagnosis not present

## 2020-12-23 DIAGNOSIS — N2581 Secondary hyperparathyroidism of renal origin: Secondary | ICD-10-CM | POA: Diagnosis not present

## 2020-12-23 DIAGNOSIS — N186 End stage renal disease: Secondary | ICD-10-CM | POA: Diagnosis not present

## 2020-12-23 DIAGNOSIS — Z992 Dependence on renal dialysis: Secondary | ICD-10-CM | POA: Diagnosis not present

## 2020-12-25 ENCOUNTER — Encounter (HOSPITAL_BASED_OUTPATIENT_CLINIC_OR_DEPARTMENT_OTHER): Payer: Medicare Other | Admitting: Internal Medicine

## 2020-12-26 ENCOUNTER — Other Ambulatory Visit: Payer: Self-pay

## 2020-12-26 ENCOUNTER — Encounter (HOSPITAL_BASED_OUTPATIENT_CLINIC_OR_DEPARTMENT_OTHER): Payer: Medicare Other | Attending: Internal Medicine | Admitting: Internal Medicine

## 2020-12-26 DIAGNOSIS — Z87891 Personal history of nicotine dependence: Secondary | ICD-10-CM | POA: Insufficient documentation

## 2020-12-26 DIAGNOSIS — N185 Chronic kidney disease, stage 5: Secondary | ICD-10-CM | POA: Diagnosis not present

## 2020-12-26 DIAGNOSIS — Z955 Presence of coronary angioplasty implant and graft: Secondary | ICD-10-CM | POA: Insufficient documentation

## 2020-12-26 DIAGNOSIS — I252 Old myocardial infarction: Secondary | ICD-10-CM | POA: Insufficient documentation

## 2020-12-26 DIAGNOSIS — N2581 Secondary hyperparathyroidism of renal origin: Secondary | ICD-10-CM | POA: Diagnosis not present

## 2020-12-26 DIAGNOSIS — Z85528 Personal history of other malignant neoplasm of kidney: Secondary | ICD-10-CM | POA: Insufficient documentation

## 2020-12-26 DIAGNOSIS — L89154 Pressure ulcer of sacral region, stage 4: Secondary | ICD-10-CM | POA: Insufficient documentation

## 2020-12-26 DIAGNOSIS — D631 Anemia in chronic kidney disease: Secondary | ICD-10-CM | POA: Diagnosis not present

## 2020-12-26 DIAGNOSIS — Z8674 Personal history of sudden cardiac arrest: Secondary | ICD-10-CM | POA: Insufficient documentation

## 2020-12-26 DIAGNOSIS — I120 Hypertensive chronic kidney disease with stage 5 chronic kidney disease or end stage renal disease: Secondary | ICD-10-CM | POA: Insufficient documentation

## 2020-12-26 DIAGNOSIS — Z992 Dependence on renal dialysis: Secondary | ICD-10-CM | POA: Diagnosis not present

## 2020-12-26 DIAGNOSIS — E43 Unspecified severe protein-calorie malnutrition: Secondary | ICD-10-CM | POA: Diagnosis not present

## 2020-12-26 DIAGNOSIS — N186 End stage renal disease: Secondary | ICD-10-CM | POA: Diagnosis not present

## 2020-12-27 NOTE — Progress Notes (Signed)
IRISH, PIECH ([id number]) Visit Report for 12/26/2020 Abuse/Suicide Risk Screen Details Patient Name: Date of Service: Travis [name] 12/26/2020 2:45 PM Medical Record Number: [id number] Patient Account Number: [id number] Date of Birth/Sex: Treating RN: Oct 04, 1961 (60 y.o. Ernestene Mention Primary Care Bralon Antkowiak: [place] Pepper Other Clinician: Referring Jetaime Pinnix: Treating Quenisha Lovins/Extender: Marlou Sa in Treatment: 0 Abuse/Suicide Risk Screen Items Answer ABUSE RISK SCREEN: Has anyone close to you tried to hurt or harm you recentlyo No Do you feel uncomfortable with anyone in your familyo No Has anyone forced you do things that you didnt want to doo No Electronic Signature(s) Signed: 12/27/2020 12:00:41 PM By: Baruch Gouty RN, BSN Entered By: Baruch Gouty on 12/26/2020 14:59:42 -------------------------------------------------------------------------------- Activities of Daily Living Details Patient Name: Date of Service: Travis [name] 12/26/2020 2:45 PM Medical Record Number: [id number] Patient Account Number: [id number] Date of Birth/Sex: Treating RN: 11/30/1961 (60 y.o. Ernestene Mention Primary Care Glanda Spanbauer: London Pepper Other Clinician: Referring Sybel Standish: Treating Nehan Flaum/Extender: Marlou Sa in Treatment: 0 Activities of Daily Living Items Answer Activities of Daily Living (Please select one for each item) Drive Automobile Not Able T Medications ake Need Assistance Use T elephone Completely Able Care for Appearance Need Assistance Use T oilet Completely Able Bath / Shower Need Assistance Dress Self Need Assistance Feed Self Completely Able Walk Need Assistance Get In / Out Bed Completely Able Housework Need Assistance Prepare Meals Need Assistance Handle Money Completely Able Shop for Self Need Assistance Electronic Signature(s) Signed: 12/27/2020 12:00:41 PM By: Baruch Gouty RN,  BSN Entered By: Baruch Gouty on 12/26/2020 15:01:59 -------------------------------------------------------------------------------- Education Screening Details Patient Name: Date of Service: Travis [name], Travis Jakob EL C. 12/26/2020 2:45 PM Medical Record Number: [id number] Patient Account Number: [id number] Date of Birth/Sex: Treating RN: December 29, 1960 (60 y.o. Ernestene Mention Primary Care Tijuan Dantes: London Pepper Other Clinician: Referring Lacye Mccarn: Treating Britainy Kozub/Extender: Marlou Sa in Treatment: 0 Primary Learner Assessed: Patient Learning Preferences/Education Level/Primary Language Learning Preference: Explanation, Demonstration, Printed Material Highest Education Level: College or Above Preferred Language: English Cognitive Barrier Language Barrier: No Translator Needed: No Memory Deficit: No Emotional Barrier: No Cultural/Religious Beliefs Affecting Medical Care: No Physical Barrier Impaired Vision: No Impaired Hearing: No Decreased Hand dexterity: No Knowledge/Comprehension Knowledge Level: High Comprehension Level: High Ability to understand written instructions: High Ability to understand verbal instructions: High Motivation Anxiety Level: Calm Cooperation: Cooperative Education Importance: Acknowledges Need Interest in Health Problems: Asks Questions Perception: Coherent Willingness to Engage in Self-Management High Activities: Readiness to Engage in Self-Management High Activities: Electronic Signature(s) Signed: 12/27/2020 12:00:41 PM By: Baruch Gouty RN, BSN Entered By: Baruch Gouty on 12/26/2020 15:02:39 -------------------------------------------------------------------------------- Fall Risk Assessment Details Patient Name: Date of Service: Travis [name], Travis Jakob EL C. 12/26/2020 2:45 PM Medical Record Number: [id number] Patient Account Number: [id number] Date of Birth/Sex: Treating RN: [birth year] (60 y.o. Ernestene Mention Primary Care Laurie Penado: London Pepper Other Clinician: Referring Hridaan Bouse: Treating Georganna Maxson/Extender: Marlou Sa in Treatment: 0 Fall Risk Assessment Items Have you had 2 or more falls in the last 12 monthso 0 No Have you had any fall that resulted in injury in the last 12 monthso 0 No FALLS RISK SCREEN History of falling - immediate or within 3 months 0 No Secondary diagnosis (Do you have 2 or more medical diagnoseso) 0 No Ambulatory aid None/bed rest/wheelchair/nurse 0 No Crutches/cane/walker 15 Yes Furniture 0 No Intravenous therapy Access/Saline/Heparin Lock 0 No Gait/Transferring Normal/ bed rest/ wheelchair 0  No Weak (short steps with or without shuffle, stooped but able to lift head while walking, may seek 10 Yes support from furniture) Impaired (short steps with shuffle, may have difficulty arising from chair, head down, impaired 0 No balance) Mental Status Oriented to own ability 0 Yes Electronic Signature(s) Signed: 12/27/2020 12:00:41 PM By: Baruch Gouty RN, BSN Entered By: Baruch Gouty on 12/26/2020 15:03:31 -------------------------------------------------------------------------------- Foot Assessment Details Patient Name: Date of Service: Travis [name], [place]. 12/26/2020 2:45 PM Medical Record Number: [id number] Patient Account Number: [id number] Date of Birth/Sex: Treating RN: 1961/08/05 (61 y.o. Ernestene Mention Primary Care Kameron Glazebrook: London Pepper Other Clinician: Referring Adamaris King: Treating Cinthya Bors/Extender: Marlou Sa in Treatment: 0 Foot Assessment Items Site Locations + = Sensation present, - = Sensation absent, C = Callus, U = Ulcer R = Redness, W = Warmth, M = Maceration, PU = Pre-ulcerative lesion F = Fissure, S = Swelling, D = Dryness Assessment Right: Left: Other Deformity: No No Prior Foot Ulcer: No No Prior Amputation: No No Charcot Joint: No No Ambulatory Status:  Ambulatory With Help Assistance Device: Cane Gait: Steady Electronic Signature(s) Signed: 12/27/2020 12:00:41 PM By: Baruch Gouty RN, BSN Entered By: Baruch Gouty on 12/26/2020 15:04:00 -------------------------------------------------------------------------------- Nutrition Risk Screening Details Patient Name: Date of Service: Travis Keeler EL C. 12/26/2020 2:45 PM Medical Record Number: [id number] Patient Account Number: [id number] Date of Birth/Sex: Treating RN: 1960/12/21 (60 y.o. Ernestene Mention Primary Care Allisa Einspahr: London Pepper Other Clinician: Referring Dellas Guard: Treating Ivanka Kirshner/Extender: Marlou Sa in Treatment: 0 Height (in): 71 Weight (lbs): 160 Body Mass Index (BMI): 22.3 Nutrition Risk Screening Items Score Screening NUTRITION RISK SCREEN: I have an illness or condition that made me change the kind and/or amount of food I eat 0 No I eat fewer than two meals per day 0 No I eat few fruits and vegetables, or milk products 0 No I have three or more drinks of beer, liquor or wine almost every day 0 No I have tooth or mouth problems that make it hard for me to eat 0 No I don't always have enough money to buy the food I need 0 No I eat alone most of the time 0 No I take three or more different prescribed or over-the-counter drugs a day 1 Yes Without wanting to, I have lost or gained 10 pounds in the last six months 2 Yes I am not always physically able to shop, cook and/or feed myself 0 No Nutrition Protocols Good Risk Protocol Moderate Risk Protocol 0 Provide education on nutrition High Risk Proctocol Risk Level: Moderate Risk Score: 3 Electronic Signature(s) Signed: 12/27/2020 12:00:41 PM By: Baruch Gouty RN, BSN Entered By: Baruch Gouty on 12/26/2020 15:03:52

## 2020-12-27 NOTE — Progress Notes (Addendum)
KAREY, STUCKI ([id number]) Visit Report for 12/26/2020 Chief Complaint Document Details Patient Name: Date of Service: Travis [name] 12/26/2020 2:45 PM Medical Record Number: [id number] Patient Account Number: [id number] Date of Birth/Sex: Treating RN: [birth year] (60 y.o. Erie Noe Primary Care Provider: Dorthy Cooler, [place] Other Clinician: Referring Provider: Treating Provider/Extender: Marlou Sa in Treatment: 0 Information Obtained from: Patient Chief Complaint 12/26/2020; patient is here for review of a stage IV sacral pressure ulcer Electronic Signature(s) Signed: 12/26/2020 5:23:51 PM By: [name] MD Entered By: [name] on 12/26/2020 16:06:14 -------------------------------------------------------------------------------- HPI Details Patient Name: Date of Service: Travis [name], Travis EL C. 12/26/2020 2:45 PM Medical Record Number: [id number] Patient Account Number: [id number] Date of Birth/Sex: Treating RN: 1961/04/19 (60 y.o. Erie Noe Primary Care Provider: Dorthy Cooler, [place] Other Clinician: Referring Provider: Treating Provider/Extender: Marlou Sa in Treatment: 0 History of Present Illness HPI Description: ADMISSION 12/26/2020 This is a 60 year old man who has chronic renal failure on dialysis presuming secondary to nephrosclerosis. He was admitted to hospital for 47 days from 10/28/2020 through 12/14/2020. Starting with some combination of an anterior ST elevation MI status post stent to the LAD septic shock secondary to MSSA. The source of the sepsis was never apparently determined. Had paroxysmal atrial fibrillation, and upper GI bleed, PEA cardiac arrests and then bilateral empyemas requiring bilateral chest tubes. His wife became aware during this exceedingly complex hospitalization of a pressure ulcer on his sacrum around December 4. They tell me that the mattress which was some form of  air pressurized mattress deflated he was lying on a hard surface. He apparently had debridements of this while he was in hospital. He was discharged from hospital with wet-to-dry dressings that they are still doing and changing daily. He seems to have made some improvement. I cannot see any relevant imaging or cultures the patient makes rigorous attempts to offload this both at home and at dialysis. Is attempting to eat well with uses Juven for supplements. I note that prior to leaving the hospital understandably his albumin level was 2. 3. He arrives in clinic today with a wound measuring 6.8 x 5 x 2 cm. He has undermining from 11-5 o'clock at 2.6 cm. There is no evidence of surrounding soft tissue infection no crepitus. No palpable bone no purulent drainage. The granulation is slightly cobblestoned Electronic Signature(s) Signed: 12/26/2020 5:23:51 PM By: [name] MD Entered By: [name] on 12/26/2020 17:00:04 -------------------------------------------------------------------------------- Physical Exam Details Patient Name: Date of Service: Travis Keeler EL C. 12/26/2020 2:45 PM Medical Record Number: [id number] Patient Account Number: [id number] Date of Birth/Sex: Treating RN: 09-Dec-1961 (60 y.o. Erie Noe Primary Care Provider: Dorthy Cooler, [place] Other Clinician: Referring Provider: Treating Provider/Extender: Marlou Sa in Treatment: 0 Constitutional Sitting or standing Blood Pressure is within target range for patient.. Pulse regular and within target range for patient.Marland Kitchen Respirations regular, non-labored and within target range.. Temperature is normal and within the target range for the patient.. Somewhat chronically ill looking man but alert and conversational. Respiratory work of breathing is normal. Complained somewhat of shortness of breath on arrival O2 sat at 89% on room air. Shallow air entry bilaterally at the bases question  bronchial breathing. Cardiovascular Heart rhythm and rate regular, without murmur or gallop.Marland Kitchen Psychiatric Somewhat despondent but alert and responsive. Notes Wound exam; fairly substantial sacral decubitus ulcer. At one point this went to bone according to the patient and his wife although that is  not true currently indicating some degree of improvement. There is undermining from about 11-5 o'clock about 2 cm even in these undermining regions I could not appreciate any bone. There is no evidence of surrounding infection. Surface granulation is somewhat cobblestoned in appearance. I did not think this required debridement although that may become necessary Electronic Signature(s) Signed: 12/26/2020 5:23:51 PM By: [name] MD Entered By: [name] on 12/26/2020 17:02:21 -------------------------------------------------------------------------------- Physician Orders Details Patient Name: Date of Service: Travis [name], Travis C. 12/26/2020 2:45 PM Medical Record Number: [id number] Patient Account Number: [id number] Date of Birth/Sex: Treating RN: 1961/06/02 (60 y.o. Erie Noe Primary Care Provider: Dorthy Cooler, [place] Other Clinician: Referring Provider: Treating Provider/Extender: Marlou Sa in Treatment: 0 Verbal / Phone Orders: No Diagnosis Coding Follow-up Appointments Return Appointment in 1 week. Bathing/ Shower/ Hygiene May shower and wash wound with soap and water. - with dressing changes. Negative Presssure Wound Therapy Other: - Run for wound vac Off-Loading Turn and reposition every 2 hours [place] wound care orders this week; continue [place] for wound care. May utilize formulary equivalent dressing for wound treatment orders unless otherwise specified. Alvis Lemmings Wound Treatment Wound #1 - Sacrum Cleanser: Soap and Water Every Other Day/30 Days Discharge Instructions: May shower and wash wound with dial antibacterial  soap and water prior to dressing change. Cleanser: Wound Cleanser [facility]) Every Other Day/30 Days Discharge Instructions: Cleanse the wound with wound cleanser prior to applying a clean dressing using gauze sponges, not tissue or cotton balls. Peri-Wound Care: Skin Prep [facility]) Every Other Day/30 Days Discharge Instructions: Use skin prep as directed Prim Dressing: FIBRACOL Plus Dressing, 2x2 in (collagen) (Home Health) Every Other Day/30 Days ary Discharge Instructions: Moisten collagen with saline or hydrogel. Be sure to pack collagen into undermining. After applying collagen, pack with saline moistened gauze. Secondary Dressing: ABD Pad, 5x9 [facility] Every Other Day/30 Days Discharge Instructions: Apply over primary dressing as directed. Secured With: 26M Medipore H Soft Cloth Surgical Tape, 2x2 (in/yd) (Home Health) Every Other Day/30 Days Discharge Instructions: Secure dressing with tape as directed. Patient Medications llergies: lisinopril, ibuprofen, aspirin, naproxen A Notifications Medication Indication Start End lidocaine DOSE topical 5 % gel - gel topical Electronic Signature(s) Signed: 12/26/2020 5:23:51 PM By: [name] MD Signed: 12/27/2020 5:25:13 PM By: Rhae Hammock RN Entered By: Rhae Hammock on 12/26/2020 15:53:56 -------------------------------------------------------------------------------- Problem List Details Patient Name: Date of Service: Travis [name], [place]. 12/26/2020 2:45 PM Medical Record Number: [id number] Patient Account Number: [id number] Date of Birth/Sex: Treating RN: [birth year] (60 y.o. 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 12/26/2020 2:45 PM Medical Record Number: [id number] Patient Account Number: [id number] Date of Birth/Sex: Treating RN: Mar 08, 1961 (60 y.o. Erie Noe Primary Care Provider: Dorthy Cooler, [place] Other Clinician: Referring Provider: Treating Provider/Extender: Marlou Sa in Treatment: 0 Subjective Chief Complaint Information  obtained from Patient 12/26/2020; patient is here for review of a stage IV sacral pressure ulcer History of Present Illness (HPI) ADMISSION 12/26/2020 This is a 60 year old man who has chronic renal failure on dialysis presuming secondary to nephrosclerosis. He was admitted to hospital for 47 days from 10/28/2020 through 12/14/2020. Starting with some combination of an anterior ST elevation MI status post stent to the LAD septic shock secondary to MSSA. The source of the sepsis was never apparently determined. Had paroxysmal atrial fibrillation, and upper GI bleed, PEA cardiac arrests and then bilateral empyemas requiring bilateral chest tubes. His wife became aware during this exceedingly complex hospitalization of a pressure ulcer on his sacrum around December 4. They tell me that the mattress which was some form of air pressurized mattress deflated he was lying on a hard surface. He apparently had debridements of this while he was in hospital. He was discharged from hospital with wet-to-dry dressings that  they are still doing and changing daily. He seems to have made some improvement. I cannot see any relevant imaging or cultures the patient makes rigorous attempts to offload this both at home and at dialysis. Is attempting to eat well with uses Juven for supplements. I note that prior to leaving the hospital understandably his albumin level was 2. 3. He arrives in clinic today with a wound measuring 6.8 x 5 x 2 cm. He has undermining from 11-5 o'clock at 2.6 cm. There is no evidence of surrounding soft tissue infection no crepitus. No palpable bone no purulent drainage. The granulation is slightly cobblestoned Patient History Information obtained from Patient. Allergies ibuprofen (Severity: Moderate, Reaction: hives), aspirin (Severity: Moderate, Reaction: hives), lisinopril (Severity: Severe, Reaction: angioedema), naproxen (Reaction: hives) Family History Cancer - Paternal Grandparents, Hypertension - Mother,Siblings, No family history of Diabetes, Heart Disease, Hereditary Spherocytosis, Kidney Disease, Lung Disease, Seizures, Stroke, Thyroid Problems, Tuberculosis. Social History Former smoker, Marital Status - Married, Alcohol Use - Never, Drug Use - Prior History - TCH, Caffeine Use - Rarely - coffee. Medical History Eyes Denies history of Cataracts, Glaucoma, Optic Neuritis Hematologic/Lymphatic Denies history of Anemia Respiratory Patient has history of Sleep Apnea - CPAP Denies history of Chronic Obstructive Pulmonary Disease (COPD) Cardiovascular Patient has history of Coronary Artery Disease, Hypertension, Myocardial Infarction Endocrine Denies history of Type I Diabetes, Type II Diabetes Genitourinary Patient has history of End Stage Renal Disease - HD Immunological Denies history of Lupus Erythematosus, Raynaudoos, Scleroderma Integumentary (Skin) Denies history of History of Burn Musculoskeletal Patient has history of Gout - hx Denies history of Rheumatoid Arthritis,  Osteoarthritis, Osteomyelitis Oncologic Denies history of Received Chemotherapy, Received Radiation Psychiatric Denies history of Anorexia/bulimia, Confinement Anxiety Hospitalization/Surgery History - cardiac stent. - debridement of sacral ulcer. Medical A Surgical History Notes nd Respiratory bil empyema Gastrointestinal GI bleed Oncologic renal cell cancer Review of Systems (ROS) Constitutional Symptoms (General Health) Complains or has symptoms of Fatigue. Eyes Denies complaints or symptoms of Dry Eyes, Vision Changes, Glasses / Contacts. Ear/Nose/Mouth/Throat Denies complaints or symptoms of Chronic sinus problems or rhinitis. Respiratory Denies complaints or symptoms of Chronic or frequent coughs, Shortness of Breath. Cardiovascular Denies complaints or symptoms of Chest pain. Gastrointestinal Denies complaints or symptoms of Frequent diarrhea, Nausea, Vomiting. Endocrine Denies complaints or symptoms of Heat/cold intolerance. Genitourinary Denies complaints or symptoms of Frequent urination. Integumentary (Skin) Complains or has symptoms of Wounds - sacrum. Musculoskeletal Complains or has symptoms of Muscle Weakness. Neurologic Denies complaints or symptoms of Numbness/parasthesias. Psychiatric Denies complaints or symptoms of Claustrophobia,  Suicidal. Objective Constitutional Sitting or standing Blood Pressure is within target range for patient.. Pulse regular and within target range for patient.Marland Kitchen Respirations regular, non-labored and within target range.. Temperature is normal and within the target range for the patient.. Somewhat chronically ill looking man but alert and conversational. Vitals Time Taken: 2:37 PM, Height: 71 in, Source: Stated, Weight: 160 lbs, Source: Stated, BMI: 22.3, Temperature: 98.3 F, Pulse: 71 bpm, Respiratory Rate: 18 breaths/min, Blood Pressure: 104/65 mmHg. Respiratory work of breathing is normal. Complained somewhat of shortness  of breath on arrival O2 sat at 89% on room air. Shallow air entry bilaterally at the bases question bronchial breathing. Cardiovascular Heart rhythm and rate regular, without murmur or gallop.Marland Kitchen Psychiatric Somewhat despondent but alert and responsive. General Notes: Wound exam; fairly substantial sacral decubitus ulcer. At one point this went to bone according to the patient and his wife although that is not true currently indicating some degree of improvement. There is undermining from about 11-5 o'clock about 2 cm even in these undermining regions I could not appreciate any bone. There is no evidence of surrounding infection. Surface granulation is somewhat cobblestoned in appearance. I did not think this required debridement although that may become necessary Integumentary (Hair, Skin) Wound #1 status is Open. Original cause of wound was Pressure Injury. The wound is located on the Sacrum. The wound measures 6.8cm length x 5.7cm width x 2cm depth; 30.442cm^2 area and 60.884cm^3 volume. There is Fat Layer (Subcutaneous Tissue) exposed. There is no tunneling noted, however, there is undermining starting at 11:00 and ending at 5:00 with a maximum distance of 2.6cm. There is a large amount of serosanguineous drainage noted. The wound margin is well defined and not attached to the wound base. There is small (1-33%) red, pink granulation within the wound bed. There is a large (67-100%) amount of necrotic tissue within the wound bed including Adherent Slough. 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 Bathing/ Shower/ Hygiene: May shower and wash wound with soap and water. - with dressing changes. Negative Presssure Wound  Therapy: Other: - Run for wound vac Off-Loading: Turn and reposition every 2 hours Home Health: New wound care orders this week; continue Home Health for wound care. May utilize formulary equivalent dressing for wound treatment orders unless otherwise specified. Alvis Lemmings The following medication(s) was prescribed: lidocaine topical 5 % gel gel topical was prescribed at facility WOUND #1: - Sacrum Wound Laterality: Cleanser: Soap and Water Every Other Day/30 Days Discharge Instructions: May shower and wash wound with dial antibacterial soap and water prior to dressing change. Cleanser: Wound Cleanser [facility]) Every Other Day/30 Days Discharge Instructions: Cleanse the wound with wound cleanser prior to applying a clean dressing using gauze sponges, not tissue or cotton balls. Peri-Wound Care: Skin Prep [facility]) Every Other Day/30 Days Discharge Instructions: Use skin prep as directed Prim Dressing: FIBRACOL Plus Dressing, 2x2 in (collagen) (Home Health) Every Other Day/30 Days ary Discharge Instructions: Moisten collagen with saline or hydrogel. Be sure to pack collagen into undermining. After applying collagen, pack with saline moistened gauze. Secondary Dressing: ABD Pad, 5x9 [facility]) Every Other Day/30 Days Discharge Instructions: Apply over primary dressing as directed. Secured With: 90M Medipore H Soft Cloth Surgical T ape, 2x2 (in/yd) (Home Health) Every Other Day/30  Days Discharge Instructions: Secure dressing with tape as directed. 1. Stage IV pressure ulcer. I agree with wet-to-dry dressings for the moment however were going to put moistened collagen underneath the wet-to-dry dressings. 2. I agree with the suggestion of a wound VAC which will be changed 3 times a week. This probably has the best chance to help this fill-in 3. Complicating factors include pressure over the wound especially at dialysis. The patient indicates he is doing everything he can along with the  assistance of the dialysis nurses to help with this 4. The patient is able to walk with a walker. We talked about being careful when he is sitting to take the weight over the ischial tuberosities and his posterior thighs rather than slouching back on the coccyx area. I spent 35 minutes in review of this patient's past medical history, face-to-face evaluation and preparation of this record Electronic Signature(s) Signed: 12/26/2020 5:23:51 PM By: [name] MD Entered By: [name] on 12/26/2020 17:04:32 -------------------------------------------------------------------------------- HxROS Details Patient Name: Date of Service: Travis [name], Travis C. 12/26/2020 2:45 PM Medical Record Number: [id number] Patient Account Number: [id number] Date of Birth/Sex: Treating RN: 04-20-1961 (60 y.o. Ernestene Mention Primary Care Provider: Dorthy Cooler, [place] Other Clinician: Referring Provider: Treating Provider/Extender: Marlou Sa in Treatment: 0 Information Obtained From Patient Constitutional Symptoms ([place]) Complaints and Symptoms: Positive for: Fatigue Eyes Complaints and Symptoms: Negative for: Dry Eyes; Vision Changes; Glasses / Contacts Medical History: Negative for: Cataracts; Glaucoma; Optic Neuritis Ear/Nose/Mouth/Throat Complaints and Symptoms: Negative for: Chronic sinus problems or rhinitis Respiratory Complaints and Symptoms: Negative for: Chronic or frequent coughs; Shortness of Breath Medical History: Positive for: Sleep Apnea - CPAP Negative for: Chronic Obstructive Pulmonary Disease (COPD) Past Medical History Notes: bil empyema Cardiovascular Complaints and Symptoms: Negative for: Chest pain Medical History: Positive for: Coronary Artery Disease; Hypertension; Myocardial Infarction Gastrointestinal Complaints and Symptoms: Negative for: Frequent diarrhea; Nausea; Vomiting Medical History: Past Medical History Notes: GI  bleed Endocrine Complaints and Symptoms: Negative for: Heat/cold intolerance Medical History: Negative for: Type I Diabetes; Type II Diabetes Genitourinary Complaints and Symptoms: Negative for: Frequent urination Medical History: Positive for: End Stage Renal Disease - HD Integumentary (Skin) Complaints and Symptoms: Positive for: Wounds - sacrum Medical History: Negative for: History of Burn Musculoskeletal Complaints and Symptoms: Positive for: Muscle Weakness Medical History: Positive for: Gout - hx Negative for: Rheumatoid Arthritis; Osteoarthritis; Osteomyelitis Neurologic Complaints and Symptoms: Negative for: Numbness/parasthesias Psychiatric Complaints and Symptoms: Negative for: Claustrophobia; Suicidal Medical History: Negative for: Anorexia/bulimia; Confinement Anxiety Hematologic/Lymphatic Medical History: Negative for: Anemia Immunological Medical History: Negative for: Lupus Erythematosus; Raynauds; Scleroderma Oncologic Medical History: Negative for: Received Chemotherapy; Received Radiation Past Medical History Notes: renal cell cancer Immunizations Pneumococcal Vaccine: Received Pneumococcal Vaccination: Yes Implantable Devices Yes Hospitalization / Surgery History Type of Hospitalization/Surgery cardiac stent debridement of sacral ulcer Family and Social History Cancer: Yes - Paternal Grandparents; Diabetes: No; Heart Disease: No; Hereditary Spherocytosis: No; Hypertension: Yes - Mother,Siblings; Kidney Disease: No; Lung Disease: No; Seizures: No; Stroke: No; Thyroid Problems: No; Tuberculosis: No; Former smoker; Marital Status - Married; Alcohol Use: Never; Drug Use: Prior History - TCH; Caffeine Use: Rarely - coffee; Financial Concerns: No; Food, Clothing or Shelter Needs: No; Support System Lacking: No; Transportation Concerns: No Electronic Signature(s) Signed: 12/26/2020 5:23:51 PM By: [name] MD Signed: 12/27/2020 12:00:41 PM By:  Baruch Gouty RN, BSN Entered By: Baruch Gouty on 12/26/2020 14:59:25 -------------------------------------------------------------------------------- [place] Details Patient Name: Date of Service: Travis [name], [place]  EL C. 12/26/2020 Medical Record Number: [id number] Patient Account Number: [id number] Date of Birth/Sex: Treating RN: [birth year] (60 y.o. 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, with stage 5 chronic kidney disease, or end stage renal disease Facility Procedures CPT4 Code: [id number] Description: 9214 - WOUND CARE VISIT-LEV 4 EST PT Modifier: Quantity: 1 Physician Procedures : CPT4 Code Description Modifier [id number] WC PHYS LEVEL 3 NEW PT ICD-10 Diagnosis Description L89.154 Pressure ulcer of sacral region, stage 4 E43 Unspecified severe protein-calorie malnutrition I13.11 Hypertensive heart and chronic kidney disease without  heart failure, with stage 5 chronic kidney diseas stage renal disease Quantity: 1 e, or end Electronic Signature(s) Signed: 01/04/2021 4:56:03 PM By: [name] MD Signed: 02/08/2021 10:42:12 AM By: Baruch Gouty RN, BSN Previous Signature: 12/26/2020 5:23:51 PM Version By: [name] MD Entered By: Baruch Gouty on 01/03/2021 10:37:21

## 2020-12-27 NOTE — Progress Notes (Addendum)
CAMRY, THEISS ([id number]) Visit Report for 12/26/2020 Allergy List Details Patient Name: Date of Service: Travis [name] 12/26/2020 2:45 PM Medical Record Number: [id number] Patient Account Number: [id number] Date of Birth/Sex: Treating RN: [birth year] (60 y.o. Ernestene Mention Primary Care Derry Kassel: Dorthy Cooler, [place] Other Clinician: Referring Symia Herdt: Treating Tomica Arseneault/Extender: Marlou Sa in Treatment: 0 Allergies Active Allergies ibuprofen Reaction: hives Severity: Moderate Type: Food aspirin Reaction: hives Severity: Moderate Type: Food lisinopril Reaction: angioedema Severity: Severe Type: Food naproxen Reaction: hives Type: Food Allergy Notes Electronic Signature(s) Signed: 12/27/2020 12:00:41 PM By: Baruch Gouty RN, BSN Entered By: Baruch Gouty on 12/26/2020 15:16:41 -------------------------------------------------------------------------------- Arrival Information Details Patient Name: Date of Service: Travis [name], Travis Jakob EL C. 12/26/2020 2:45 PM Medical Record Number: [id number] Patient Account Number: [id number] Date of Birth/Sex: Treating RN: 1961-10-16 (60 y.o. Ernestene Mention Primary Care Diany Formosa: Dorthy Cooler, [place] Other Clinician: Referring Velmer Broadfoot: Treating Jameek Bruntz/Extender: Marlou Sa in Treatment: 0 Visit Information Patient Arrived: Wheel Chair Arrival Time: 14:31 Accompanied By: spouse Transfer Assistance: None Patient Identification Verified: Yes Secondary Verification Process Completed: Yes Patient Requires Transmission-Based Precautions: No Patient Has Alerts: No Electronic Signature(s) Signed: 12/27/2020 12:00:41 PM By: Baruch Gouty RN, BSN Entered By: Baruch Gouty on 12/26/2020 14:37:27 -------------------------------------------------------------------------------- Clinic Level of Care Assessment Details Patient Name: Date of Service: Travis Keeler EL C.  12/26/2020 2:45 PM Medical Record Number: [id number] Patient Account Number: [id number] Date of Birth/Sex: Treating RN: 26-May-1961 (60 y.o. Erie Noe Primary Care Alieah Brinton: Dorthy Cooler, [place] Other Clinician: Referring Teondre Jarosz: Treating Audel Coakley/Extender: Marlou Sa in Treatment: 0 Clinic Level of Care Assessment Items TOOL 2 Quantity Score []  - 0 Use when only an EandM is performed on the INITIAL visit ASSESSMENTS - Nursing Assessment / Reassessment X- 1 20 General Physical Exam (combine w/ comprehensive assessment (listed just below) when performed on new pt. evals) X- 1 25 Comprehensive Assessment (HX, ROS, Risk Assessments, Wounds Hx, etc.) ASSESSMENTS - Wound and Skin A ssessment / Reassessment X - Simple Wound Assessment / Reassessment - one wound 1 5 []  - 0 Complex Wound Assessment / Reassessment - multiple wounds []  - 0 Dermatologic / Skin Assessment (not related to wound area) ASSESSMENTS - Ostomy and/or Continence Assessment and Care []  - 0 Incontinence Assessment and Management []  - 0 Ostomy Care Assessment and Management (repouching, etc.) PROCESS - Coordination of Care X - Simple Patient / Family Education for ongoing care 1 15 []  - 0 Complex (extensive) Patient / Family Education for ongoing care X- 1 10 Staff obtains Programmer, systems, Records, T Results / Process Orders est X- 1 10 Staff telephones HHA, Nursing Homes / Clarify orders / etc []  - 0 Routine Transfer to another Facility (non-emergent condition) []  - 0 Routine Hospital Admission (non-emergent condition) X- 1 15 New Admissions / Biomedical engineer / Ordering NPWT Apligraf, etc. , []  - 0 Emergency Hospital Admission (emergent condition) X- 1 10 Simple Discharge Coordination []  - 0 Complex (extensive) Discharge Coordination PROCESS - Special Needs []  - 0 Pediatric / Minor Patient Management []  - 0 Isolation Patient Management []  - 0 Hearing / Language /  Visual special needs []  - 0 Assessment of Community assistance (transportation, D/C planning, etc.) []  - 0 Additional assistance / Altered mentation []  - 0 Support Surface(s) Assessment (bed, cushion, seat, etc.) INTERVENTIONS - Wound Cleansing / Measurement X- 1 5 Wound Imaging (photographs - any number of wounds) []  - 0 Wound Tracing (instead of photographs) X- 1 5  Simple Wound Measurement - one wound []  - 0 Complex Wound Measurement - multiple wounds X- 1 5 Simple Wound Cleansing - one wound []  - 0 Complex Wound Cleansing - multiple wounds INTERVENTIONS - Wound Dressings X - Small Wound Dressing one or multiple wounds 1 10 []  - 0 Medium Wound Dressing one or multiple wounds []  - 0 Large Wound Dressing one or multiple wounds []  - 0 Application of Medications - injection INTERVENTIONS - Miscellaneous []  - 0 External ear exam []  - 0 Specimen Collection (cultures, biopsies, blood, body fluids, etc.) []  - 0 Specimen(s) / Culture(s) sent or taken to Lab for analysis []  - 0 Patient Transfer (multiple staff / Civil Service fast streamer / Similar devices) []  - 0 Simple Staple / Suture removal (25 or less) []  - 0 Complex Staple / Suture removal (26 or more) []  - 0 Hypo / Hyperglycemic Management (close monitor of Blood Glucose) []  - 0 Ankle / Brachial Index (ABI) - do not check if billed separately Has the patient been seen at the hospital within the last three years: Yes Total Score: 135 Level Of Care: New/Established - Level 4 Electronic Signature(s) Signed: 02/08/2021 10:42:12 AM By: Baruch Gouty RN, BSN Previous Signature: 12/27/2020 5:25:13 PM Version By: Rhae Hammock RN Entered By: Baruch Gouty on 01/03/2021 10:36:18 -------------------------------------------------------------------------------- Encounter Discharge Information Details Patient Name: Date of Service: Travis [name], Travis EL C. 12/26/2020 2:45 PM Medical Record Number: [id number] Patient Account Number:  [id number] Date of Birth/Sex: Treating RN: 05/09/1961 (60 y.o. Hessie Diener Primary Care Timber Marshman: Dorthy Cooler, [place] Other Clinician: Referring Cassian Torelli: Treating Daneil Beem/Extender: Marlou Sa in Treatment: 0 Encounter Discharge Information Items Discharge Condition: Stable Ambulatory Status: Wheelchair Discharge Destination: Home Transportation: Private Auto Accompanied By: wife Schedule Follow-up Appointment: Yes Clinical Summary of Care: Electronic Signature(s) Signed: 12/26/2020 5:33:58 PM By: Deon Pilling Entered By: Deon Pilling on 12/26/2020 17:20:05 -------------------------------------------------------------------------------- General Visit Notes Details Patient Name: Date of Service: Travis [name], Travis Jakob EL C. 12/26/2020 2:45 PM Medical Record Number: [id number] Patient Account Number: [id number] Date of Birth/Sex: Treating RN: 02/17/1961 (60 y.o. Ernestene Mention Primary Care Catalina Salasar: Dorthy Cooler, [place] Other Clinician: Referring Jachelle Fluty: Treating Ronson Hagins/Extender: Marlou Sa in Treatment: 0 Notes 1093 pt asking for oxygen. O2 sats RA 89%. Elevated head of bed. Applied nonrebreather with 6 L O2, sats up to 99% with O2. Electronic Signature(s) Signed: 12/27/2020 12:00:41 PM By: Baruch Gouty RN, BSN Entered By: Baruch Gouty on 12/26/2020 15:29:07 -------------------------------------------------------------------------------- Lower Extremity Assessment Details Patient Name: Date of Service: Travis Keeler EL C. 12/26/2020 2:45 PM Medical Record Number: [id number] Patient Account Number: [id number] Date of Birth/Sex: Treating RN: 1961-07-29 (60 y.o. Ernestene Mention Primary Care Sitlaly Gudiel: Dorthy Cooler, [place] Other Clinician: Referring Tammy Ericsson: Treating Jaystin Mcgarvey/Extender: Marlou Sa in Treatment: 0 Electronic Signature(s) Signed: 12/27/2020 12:00:41 PM By: Baruch Gouty RN,  BSN Entered By: Baruch Gouty on 12/26/2020 15:04:07 -------------------------------------------------------------------------------- Multi Wound Chart Details Patient Name: Date of Service: Travis [name], Travis EL C. 12/26/2020 2:45 PM Medical Record Number: [id number] Patient Account Number: [id number] Date of Birth/Sex: Treating RN: 17-Mar-1961 (60 y.o. Erie Noe Primary Care Avalon Coppinger: Dorthy Cooler, [place] Other Clinician: Referring Dawnelle Warman: Treating Deidrick Rainey/Extender: Marlou Sa in Treatment: 0 Vital Signs Height(in): 71 Pulse(bpm): 85 Weight(lbs): 160 Blood Pressure(mmHg): 104/65 Body Mass Index(BMI): 22 Temperature(F): 98.3 Respiratory Rate(breaths/min): 18 Photos: [1:No Photos Sacrum] [N/A:N/A N/A] Wound Location: [1:Pressure Injury] [N/A:N/A] Wounding Event: [1:Pressure Ulcer] [N/A:N/A] Primary Etiology: [1:Sleep Apnea, Coronary Artery] [N/A:N/A] Comorbid History: [1:Disease,  Hypertension, Myocardial Infarction, End Stage Renal Disease, Gout 11/08/2020] [N/A:N/A] Date Acquired: [1:0] [N/A:N/A] Weeks of Treatment: [1:Open] [N/A:N/A] Wound Status: [1:6.8x5.7x2] [N/A:N/A] Measurements L x W x D (cm) [1:30.442] [N/A:N/A] A (cm) : rea [1:60.884] [N/A:N/A] Volume (cm) : [1:11] Starting Position 1 (o'clock): [1:5] Ending Position 1 (o'clock): [1:2.6] Maximum Distance 1 (cm): [1:Yes] [N/A:N/A] Undermining: [1:Category/Stage IV] [N/A:N/A] Classification: [1:Large] [N/A:N/A] Exudate A mount: [1:Serosanguineous] [N/A:N/A] Exudate Type: [1:red, brown] [N/A:N/A] Exudate Color: [1:Well defined, not attached] [N/A:N/A] Wound Margin: [1:Small (1-33%)] [N/A:N/A] Granulation A mount: [1:Red, Pink] [N/A:N/A] Granulation Quality: [1:Large (67-100%)] [N/A:N/A] Necrotic A mount: [1:Fat Layer (Subcutaneous Tissue): Yes N/A] Exposed Structures: [1:Fascia: No Tendon: No Muscle: No Joint: No Bone: No None] [N/A:N/A] Treatment Notes Electronic  Signature(s) Signed: 12/26/2020 5:23:51 PM By: [name] MD Signed: 12/27/2020 5:25:13 PM By: Rhae Hammock RN Entered By: [name] on 12/26/2020 16:54:40 -------------------------------------------------------------------------------- Multi-Disciplinary Care Plan Details Patient Name: Date of Service: Travis [name], Travis EL C. 12/26/2020 2:45 PM Medical Record Number: [id number] Patient Account Number: [id number] Date of Birth/Sex: Treating RN: May 23, 1961 (60 y.o. Erie Noe Primary Care Ledell Codrington: Dorthy Cooler, [place] Other Clinician: Referring Delayla Hoffmaster: Treating Rahmir Beever/Extender: Marlou Sa in Treatment: 0 Active Inactive Orientation to the Wound Care Program Nursing Diagnoses: Knowledge deficit related to the wound healing center program Goals: Patient/caregiver will verbalize understanding of the [place] Date Initiated: 12/26/2020 Target Resolution Date: 01/12/2021 Goal Status: Active Interventions: Provide education on orientation to the wound center Notes: Wound/Skin Impairment Nursing Diagnoses: Impaired tissue integrity Knowledge deficit related to ulceration/compromised skin integrity Goals: Patient will have a decrease in wound volume by X% from date: (specify in notes) Date Initiated: 12/26/2020 Target Resolution Date: 01/12/2021 Goal Status: Active Patient/caregiver will verbalize understanding of skin care regimen Date Initiated: 12/26/2020 Target Resolution Date: 01/12/2021 Goal Status: Active Ulcer/skin breakdown will have a volume reduction of 30% by week 4 Date Initiated: 12/26/2020 Target Resolution Date: 01/12/2021 Goal Status: Active Interventions: Assess patient/caregiver ability to obtain necessary supplies Assess patient/caregiver ability to perform ulcer/skin care regimen upon admission and as needed Assess ulceration(s) every visit Notes: Electronic Signature(s) Signed: 12/27/2020 5:25:13 PM  By: Rhae Hammock RN Entered By: Rhae Hammock on 12/26/2020 15:31:11 -------------------------------------------------------------------------------- Pain Assessment Details Patient Name: Date of Service: Travis Keeler EL C. 12/26/2020 2:45 PM Medical Record Number: [id number] Patient Account Number: [id number] Date of Birth/Sex: Treating RN: July 31, 1961 (60 y.o. Ernestene Mention Primary Care Glenyce Randle: Dorthy Cooler, [place] Other Clinician: Referring Kou Gucciardo: Treating Christina Gintz/Extender: Marlou Sa in Treatment: 0 Active Problems Location of Pain Severity and Description of Pain Patient Has Paino Yes Site Locations Pain Location: Pain in Ulcers With Dressing Change: Yes Duration of the Pain. Constant / Intermittento Intermittent Rate the pain. Current Pain Level: 0 Worst Pain Level: 8 Least Pain Level: 0 Character of Pain Describe the Pain: Aching, Tender Pain Management and Medication Current Pain Management: Medication: Yes Other: reposition Is the Current Pain Management Adequate: Adequate How does your wound impact your activities of daily livingo Sleep: No Bathing: No Appetite: No Relationship With Others: No Bladder Continence: No Emotions: No Bowel Continence: No Drive: Yes Toileting: No Hobbies: No Dressing: No Electronic Signature(s) Signed: 12/27/2020 12:00:41 PM By: Baruch Gouty RN, BSN Entered By: Baruch Gouty on 12/26/2020 15:14:13 -------------------------------------------------------------------------------- Patient/Caregiver Education Details Patient Name: Date of Service: Travis [name] 1/18/2022andnbsp2:45 PM Medical Record Number: [id number] Patient Account Number: [id number] Date of Birth/Gender: Treating RN: 1961/10/14 (60 y.o. Erie Noe Primary Care Physician:  [place], [place] Other Clinician: Referring Physician: Treating Physician/Extender: Marlou Sa in  Treatment: 0 Education Assessment Education Provided To: Patient Education Topics Provided Basic Hygiene: Methods: Explain/Verbal Responses: State content correctly Welcome T [place]: o Methods: Explain/Verbal Responses: State content correctly Electronic Signature(s) Signed: 12/27/2020 5:25:13 PM By: Rhae Hammock RN Entered By: Rhae Hammock on 12/26/2020 15:31:24 -------------------------------------------------------------------------------- Wound Assessment Details Patient Name: Date of Service: Travis Keeler EL C. 12/26/2020 2:45 PM Medical Record Number: [id number] Patient Account Number: [id number] Date of Birth/Sex: Treating RN: 28-Aug-1961 (60 y.o. Ernestene Mention Primary Care Yaminah Clayborn: Dorthy Cooler, [place] Other Clinician: Referring Raynold Blankenbaker: Treating Karlisha Mathena/Extender: Marlou Sa in Treatment: 0 Wound Status Wound Number: 1 Primary Pressure Ulcer Etiology: Wound Location: Sacrum Wound Open Wounding Event: Pressure Injury Status: Date Acquired: 11/08/2020 Comorbid Sleep Apnea, Coronary Artery Disease, Hypertension, Myocardial Weeks Of Treatment: 0 History: Infarction, End Stage Renal Disease, Gout Clustered Wound: No Photos Photo Uploaded By: Mikeal Hawthorne on 12/29/2020 10:43:12 Wound Measurements Length: (cm) 6.8 Width: (cm) 5.7 Depth: (cm) 2 Area: (cm) 30.442 Volume: (cm) 60.884 % Reduction in Area: % Reduction in Volume: Epithelialization: None Tunneling: No Undermining: Yes Starting Position (o'clock): 11 Ending Position (o'clock): 5 Maximum Distance: (cm) 2.6 Wound Description Classification: Category/Stage IV Wound Margin: Well defined, not attached Exudate Amount: Large Exudate Type: Serosanguineous Exudate Color: red, brown Foul Odor After Cleansing: No Slough/Fibrino Yes Wound Bed Granulation Amount: Small (1-33%) Exposed Structure Granulation Quality: Red, Pink Fascia Exposed:  No Necrotic Amount: Large (67-100%) Fat Layer (Subcutaneous Tissue) Exposed: Yes Necrotic Quality: Adherent Slough Tendon Exposed: No Muscle Exposed: No Joint Exposed: No Bone Exposed: No Electronic Signature(s) Signed: 12/27/2020 12:00:41 PM By: Baruch Gouty RN, BSN Entered By: Baruch Gouty on 12/26/2020 15:11:45 -------------------------------------------------------------------------------- Vitals Details Patient Name: Date of Service: Travis [name], Travis EL C. 12/26/2020 2:45 PM Medical Record Number: [id number] Patient Account Number: [id number] Date of Birth/Sex: Treating RN: [birth year] (60 y.o. Ernestene Mention Primary Care Maryam Feely: Dorthy Cooler, [place] Other Clinician: Referring Berlie Hatchel: Treating Hend Mccarrell/Extender: Marlou Sa in Treatment: 0 Vital Signs Time Taken: 14:37 Temperature (F): 98.3 Height (in): 71 Pulse (bpm): 71 Source: Stated Respiratory Rate (breaths/min): 18 Weight (lbs): 160 Blood Pressure (mmHg): 104/65 Source: Stated Reference Range: 80 - 120 mg / dl Body Mass Index (BMI): 22.3 Electronic Signature(s) Signed: 12/27/2020 12:00:41 PM By: Baruch Gouty RN, BSN Signed: 12/27/2020 12:00:41 PM By: Baruch Gouty RN, BSN Entered By: Baruch Gouty on 12/26/2020 14:41:42

## 2020-12-28 DIAGNOSIS — Z992 Dependence on renal dialysis: Secondary | ICD-10-CM | POA: Diagnosis not present

## 2020-12-28 DIAGNOSIS — N186 End stage renal disease: Secondary | ICD-10-CM | POA: Diagnosis not present

## 2020-12-28 DIAGNOSIS — N2581 Secondary hyperparathyroidism of renal origin: Secondary | ICD-10-CM | POA: Diagnosis not present

## 2020-12-28 DIAGNOSIS — D631 Anemia in chronic kidney disease: Secondary | ICD-10-CM | POA: Diagnosis not present

## 2020-12-30 DIAGNOSIS — N186 End stage renal disease: Secondary | ICD-10-CM | POA: Diagnosis not present

## 2020-12-30 DIAGNOSIS — D631 Anemia in chronic kidney disease: Secondary | ICD-10-CM | POA: Diagnosis not present

## 2020-12-30 DIAGNOSIS — Z992 Dependence on renal dialysis: Secondary | ICD-10-CM | POA: Diagnosis not present

## 2020-12-30 DIAGNOSIS — N2581 Secondary hyperparathyroidism of renal origin: Secondary | ICD-10-CM | POA: Diagnosis not present

## 2021-01-02 ENCOUNTER — Encounter (HOSPITAL_BASED_OUTPATIENT_CLINIC_OR_DEPARTMENT_OTHER): Payer: Medicare Other | Admitting: Internal Medicine

## 2021-01-02 DIAGNOSIS — D631 Anemia in chronic kidney disease: Secondary | ICD-10-CM | POA: Diagnosis not present

## 2021-01-02 DIAGNOSIS — N2581 Secondary hyperparathyroidism of renal origin: Secondary | ICD-10-CM | POA: Diagnosis not present

## 2021-01-02 DIAGNOSIS — Z992 Dependence on renal dialysis: Secondary | ICD-10-CM | POA: Diagnosis not present

## 2021-01-02 DIAGNOSIS — N186 End stage renal disease: Secondary | ICD-10-CM | POA: Diagnosis not present

## 2021-01-04 DIAGNOSIS — N186 End stage renal disease: Secondary | ICD-10-CM | POA: Diagnosis not present

## 2021-01-04 DIAGNOSIS — Z992 Dependence on renal dialysis: Secondary | ICD-10-CM | POA: Diagnosis not present

## 2021-01-04 DIAGNOSIS — N2581 Secondary hyperparathyroidism of renal origin: Secondary | ICD-10-CM | POA: Diagnosis not present

## 2021-01-04 DIAGNOSIS — D631 Anemia in chronic kidney disease: Secondary | ICD-10-CM | POA: Diagnosis not present

## 2021-01-06 DIAGNOSIS — D631 Anemia in chronic kidney disease: Secondary | ICD-10-CM | POA: Diagnosis not present

## 2021-01-06 DIAGNOSIS — N186 End stage renal disease: Secondary | ICD-10-CM | POA: Diagnosis not present

## 2021-01-06 DIAGNOSIS — Z992 Dependence on renal dialysis: Secondary | ICD-10-CM | POA: Diagnosis not present

## 2021-01-06 DIAGNOSIS — N2581 Secondary hyperparathyroidism of renal origin: Secondary | ICD-10-CM | POA: Diagnosis not present

## 2021-01-12 ENCOUNTER — Other Ambulatory Visit: Payer: Self-pay

## 2021-01-12 ENCOUNTER — Encounter (HOSPITAL_BASED_OUTPATIENT_CLINIC_OR_DEPARTMENT_OTHER): Payer: Medicare Other | Attending: Internal Medicine | Admitting: Internal Medicine

## 2021-01-12 DIAGNOSIS — N186 End stage renal disease: Secondary | ICD-10-CM | POA: Diagnosis not present

## 2021-01-12 DIAGNOSIS — I252 Old myocardial infarction: Secondary | ICD-10-CM | POA: Insufficient documentation

## 2021-01-12 DIAGNOSIS — Z992 Dependence on renal dialysis: Secondary | ICD-10-CM | POA: Diagnosis not present

## 2021-01-12 DIAGNOSIS — Z955 Presence of coronary angioplasty implant and graft: Secondary | ICD-10-CM | POA: Diagnosis not present

## 2021-01-12 DIAGNOSIS — L89154 Pressure ulcer of sacral region, stage 4: Secondary | ICD-10-CM | POA: Diagnosis not present

## 2021-01-12 DIAGNOSIS — I1311 Hypertensive heart and chronic kidney disease without heart failure, with stage 5 chronic kidney disease, or end stage renal disease: Secondary | ICD-10-CM | POA: Insufficient documentation

## 2021-01-12 DIAGNOSIS — E43 Unspecified severe protein-calorie malnutrition: Secondary | ICD-10-CM | POA: Insufficient documentation

## 2021-01-12 DIAGNOSIS — I480 Paroxysmal atrial fibrillation: Secondary | ICD-10-CM | POA: Diagnosis not present

## 2021-01-12 NOTE — Progress Notes (Signed)
MONTANA, FASSNACHT ([id number]) Visit Report for 01/12/2021 HPI Details Patient Name: Date of Service: Travis [name] 01/12/2021 2:00 PM Medical Record Number: [id number] Patient Account Number: [id number] Date of Birth/Sex: Treating RN: 04/17/1961 (60 y.o. Ernestene Mention Primary Care Provider: London Pepper Other Clinician: Referring Provider: Treating Provider/Extender: Marlou Sa in Treatment: 2 History of Present Illness HPI Description: ADMISSION 12/26/2020 This is a 60 year old man who has chronic renal failure on dialysis presuming secondary to nephrosclerosis. He was admitted to hospital for 47 days from 10/28/2020 through 12/14/2020. Starting with some combination of an anterior ST elevation MI status post stent to the LAD septic shock secondary to MSSA. The source of the sepsis was never apparently determined. Had paroxysmal atrial fibrillation, and upper GI bleed, PEA cardiac arrests and then bilateral empyemas requiring bilateral chest tubes. His wife became aware during this exceedingly complex hospitalization of a pressure ulcer on his sacrum around December 4. They tell me that the mattress which was some form of air pressurized mattress deflated he was lying on a hard surface. He apparently had debridements of this while he was in hospital. He was discharged from hospital with wet-to-dry dressings that they are still doing and changing daily. He seems to have made some improvement. I cannot see any relevant imaging or cultures the patient makes rigorous attempts to offload this both at home and at dialysis. Is attempting to eat well with uses Juven for supplements. I note that prior to leaving the hospital understandably his albumin level was 2. 3. He arrives in clinic today with a wound measuring 6.8 x 5 x 2 cm. He has undermining from 11-5 o'clock at 2.6 cm. There is no evidence of surrounding soft tissue infection no crepitus. No palpable bone  no purulent drainage. The granulation is slightly cobblestoned 2/4; patient has been using the wound VAC that we ordered last time. He has home health changing this. I do not think they applied the collagen under the VAC foam that we suggested last time. The undermining area from 11-5 o'clock is filled in otherwise the surface of this looks about the same. There is a small tail to this that goes into the left buttock Electronic Signature(s) Signed: 01/12/2021 4:22:00 PM By: [name] MD Entered By: [name] on 01/12/2021 15:24:15 -------------------------------------------------------------------------------- Physical Exam Details Patient Name: Date of Service: Travis [name], Travis [name]. 01/12/2021 2:00 PM Medical Record Number: [id number] Patient Account Number: [id number] Date of Birth/Sex: Treating RN: October 23, 1961 (60 y.o. Ernestene Mention Primary Care Provider: London Pepper Other Clinician: Referring Provider: Treating Provider/Extender: Marlou Sa in Treatment: 2 Constitutional Sitting or standing Blood Pressure is within target range for patient.. Pulse regular and within target range for patient.Marland Kitchen Respirations regular, non-labored and within target range.. Temperature is normal and within the target range for the patient.Marland Kitchen Appears in no distress. Notes Wound exam; the undermining area that we determined last time is filled in which is a good sign of improvement. There is no probable bone here however the granulation is right against bone. Somewhat dry and cobblestoned in appearance. There is a tail to this going into the left buttock with the same type of granulation appearance Electronic Signature(s) Signed: 01/12/2021 4:22:00 PM By: [name] MD Entered By: [name] on 01/12/2021 15:25:11 -------------------------------------------------------------------------------- Physician Orders Details Patient Name: Date of Service: Travis [name],  Travis [name]. 01/12/2021 2:00 PM Medical Record Number: [id number] Patient Account Number: [id number] Date of Birth/Sex:  Treating RN: [birth year] (60 y.o. 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 Bathing/ Shower/ Hygiene May shower and wash wound with soap and water. - with dressing changes. Negative Presssure Wound Therapy Wound Vac to wound continuously at 111mm/hg pressure - line wound bed with collagen under Travis foam to be changed 3 times per week by home health Travis Foam Off-Loading Turn and reposition every 2 hours Home Health No change in wound care orders this week; continue Home Health for wound care. May utilize formulary equivalent dressing for wound treatment orders unless otherwise specified. - Home health to change VAC 3 times per week including on days seen in the wound clinic Other Home Health Orders/Instructions: - [facility]ound Treatment Wound #1 - Sacrum Cleanser: Soap and Water 3 x Per Week/30 Days Discharge Instructions: May shower and wash wound with dial antibacterial soap and water prior to dressing change. Cleanser: Wound Cleanser (Home Health) 3 x Per Week/30 Days Discharge Instructions: Cleanse the wound with wound cleanser prior to applying a clean dressing using gauze sponges, not tissue or cotton balls. Peri-Wound Care: Skin Prep (Home Health) 3 x Per Week/30 Days Discharge Instructions: Use skin prep as directed Prim Dressing: FIBRACOL Plus Dressing, 2x2 in  (collagen) (Home Health) 3 x Per Week/30 Days ary Discharge Instructions: Moisten collagen with saline or hydrogel. Be sure to pack collagen into undermining.place Travis foam over collagen. Electronic Signature(s) Signed: 01/12/2021 4:22:00 PM By: [name] MD Signed: 01/12/2021 4:47:51 PM By: Baruch Gouty RN, BSN Entered By: Baruch Gouty on 01/12/2021 15:06:14 -------------------------------------------------------------------------------- Problem List Details Patient Name: Date of Service: Travis [name], Travis [name]. 01/12/2021 2:00 PM Medical Record Number: [id number] Patient Account Number: [id number] Date of Birth/Sex: Treating RN: 10/09/1961 (60 y.o. 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 Ernestene Mention Primary Care Provider: London Pepper Other Clinician: Referring Provider: Treating Provider/Extender: Marlou Sa in Treatment: 2 Subjective History of Present Illness  (HPI) ADMISSION 12/26/2020 This is a 60 year old man who has chronic renal failure on dialysis presuming secondary to nephrosclerosis. He was admitted to hospital for 47 days from 10/28/2020 through 12/14/2020. Starting with some combination of an anterior ST elevation MI status post stent to the LAD septic shock secondary to MSSA. The source of the sepsis was never apparently determined. Had paroxysmal atrial fibrillation, and upper GI bleed, PEA cardiac arrests and then bilateral empyemas requiring bilateral chest  tubes. His wife became aware during this exceedingly complex hospitalization of a pressure ulcer on his sacrum around December 4. They tell me that the mattress which was some form of air pressurized mattress deflated he was lying on a hard surface. He apparently had debridements of this while he was in hospital. He was discharged from hospital with wet-to-dry dressings that they are still doing and changing daily. He seems to have made some improvement. I cannot see any relevant imaging or cultures the patient makes rigorous attempts to offload this both at home and at dialysis. Is attempting to eat well with uses Juven for supplements. I note that prior to leaving the hospital understandably his albumin level was 2. 3. He arrives in clinic today with a wound measuring 6.8 x 5 x 2 cm. He has undermining from 11-5 o'clock at 2.6 cm. There is no evidence of surrounding soft tissue infection no crepitus. No palpable bone no purulent drainage. The granulation is slightly cobblestoned 2/4; patient has been using the wound VAC that we ordered last time. He has home health changing this. I do not think they applied the collagen under the VAC foam that we suggested last time. The undermining area from 11-5 o'clock is filled in otherwise the surface of this looks about the same. There is a small tail to this that goes into the left buttock Objective Constitutional Sitting or standing Blood  Pressure is within target range for patient.. Pulse regular and within target range for patient.Marland Kitchen Respirations regular, non-labored and within target range.. Temperature is normal and within the target range for the patient.Marland Kitchen Appears in no distress. Vitals Time Taken: 2:21 PM, Height: 71 in, Weight: 160 lbs, BMI: 22.3, Temperature: 98.3 F, Pulse: 67 bpm, Respiratory Rate: 18 breaths/min, Blood Pressure: 103/63 mmHg. General Notes: Wound exam; the undermining area that we determined last time is filled in which is a good sign of improvement. There is no probable bone here however the granulation is right against bone. Somewhat dry and cobblestoned in appearance. There is a tail to this going into the left buttock with the same type of granulation appearance Integumentary (Hair, Skin) Wound #1 status is Open. Original cause of wound was Pressure Injury. The wound is located on the Sacrum. The wound measures 6.5cm length x 4.5cm width x 1.5cm depth; 22.973cm^2 area and 34.459cm^3 volume. There is Fat Layer (Subcutaneous Tissue) exposed. There is no tunneling or undermining noted. There is a large amount of serosanguineous drainage noted. The wound margin is well defined and not attached to the wound base. There is small (1-33%) red, pink granulation within the wound bed. There is a large (67-100%) amount of necrotic tissue within the wound bed including Adherent Slough. 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 Bathing/ Shower/ Hygiene: May shower and wash wound with soap and water. - with dressing changes. Negative Presssure Wound Therapy: Wound Vac to wound continuously at 176mm/hg pressure - line wound bed with collagen  under Travis foam to be changed 3 times per week by home health Travis Foam Off-Loading: Turn and reposition every 2 hours Home Health: No change in wound care orders this week; continue Home Health for wound care. May utilize formulary equivalent dressing for wound treatment orders unless otherwise specified. - Home health to change  VAC 3 times per week including on days seen in the wound clinic Other Home Health Orders/Instructions: - [facility] WOUND #1: - Sacrum Wound Laterality: Cleanser: Soap and Water 3 x Per Week/30 Days Discharge Instructions: May shower and wash wound with dial antibacterial soap and water prior to dressing change. Cleanser: Wound Cleanser (Home Health) 3 x Per Week/30 Days Discharge Instructions: Cleanse the wound with wound cleanser prior to applying a clean dressing using gauze sponges, not tissue or cotton balls. Peri-Wound Care: Skin Prep (Home Health) 3 x Per Week/30 Days Discharge Instructions: Use skin prep as directed Prim Dressing: FIBRACOL Plus Dressing, 2x2 in (collagen) (Home Health) 3 x Per Week/30 Days ary Discharge Instructions: Moisten collagen with saline or hydrogel. Be sure to pack collagen into undermining.place Travis foam over collagen. 1. I would like to use the collagen under the Travis foam. 2. The wound VAC is filled in the undermining area. Still not much improvement in the wound depth. I would like to see this again in 2 weeks time. 3 would not be out of the question that he will require a skin substitute to attempt to improve the granulation. 4. There is no evidence of infection no exposed bone. 5. He is already religiously offloading this including at dialysis Electronic Signature(s) Signed: 01/12/2021 4:22:00 PM By: [name] MD Entered By: [name] on 01/12/2021 15:26:38 -------------------------------------------------------------------------------- SuperBill Details Patient Name: Date of Service: Travis [name], Travis [name]  01/12/2021 Medical Record Number: [id number] Patient Account Number: [id number] Date of Birth/Sex: Treating RN: 11/06/1961 (60 y.o. 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, with stage 5 chronic kidney disease, or end stage renal disease Facility Procedures CPT4 Code: [id number] Description: 97605 - WOUND VAC-50 SQ CM OR LESS Modifier: Quantity: 1 Physician Procedures : CPT4 Code Description Modifier [id number] - WC PHYS LEVEL 3 - EST PT ICD-10 Diagnosis Description L89.154 Pressure ulcer of sacral region, stage 4 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

## 2021-01-13 ENCOUNTER — Other Ambulatory Visit: Payer: Self-pay | Admitting: Student

## 2021-01-13 MED ORDER — ATORVASTATIN CALCIUM 80 MG PO TABS: 80.0000 mg | ORAL_TABLET | Freq: Every day | ORAL | 1 refills | Status: AC

## 2021-01-13 MED ORDER — TICAGRELOR 90 MG PO TABS: 90.0000 mg | ORAL_TABLET | Freq: Two times a day (BID) | ORAL | 1 refills | Status: AC

## 2021-01-13 MED ORDER — AMIODARONE HCL 200 MG PO TABS: 200.0000 mg | ORAL_TABLET | Freq: Every day | ORAL | 1 refills | Status: AC

## 2021-01-14 ENCOUNTER — Encounter: Payer: Self-pay | Admitting: Cardiology

## 2021-01-14 NOTE — Progress Notes (Signed)
Primary Care Provider: Darrow Bussing, MD Cardiologist: Bryan Lemma, MD Electrophysiologist: None  Clinic Note: Chief Complaint  Patient presents with  . Hospitalization Follow-up    Slowly gaining strength back.  Working with PT now  . Coronary Artery Disease    LAD PCI, still has RCA and LCx disease.  Planning to consider RCA PCI once he is stronger.  . Atrial Fibrillation    As far as I can tell, no breakthroughs..  Remains on amiodarone.   ==================================  ASSESSMENT/PLAN   Problem List Items Addressed This Visit    ESRD (end stage renal disease) on dialysis (HCC) (Chronic)    Hypotension during dialysis.  At this point dialysis is extremely important.  He is using midodrine to support blood pressure.  I think we can reduce his metoprolol dose 25 mg twice daily and hold 1 dose of dialysis as well as the evening dose prior to HD.        3 V- CAD w/ ACS/STEMI: Culprit = 99% mLAD @ D1 (DES PCI jailing D1); 99% ost-AVGLCx, 80% calcified napkin ring prox RCA.  - Primary (Chronic)    Status post PCI to the LAD in the setting of what looked like an anterior MI.  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 At this point I think the RCA is reasonable to work on-likely requiring debulking therapy.  I reviewed the images with several of my interventional cardiologist, I do not think that the AV groove LCx is a very good PCI target.  Be very difficult to land the stent and not impinge upon the major portion of the LCx. He is not having any active anginal symptoms, and is still quite weak.  Has not really started that much limiting exercise level.  Plan  Continue low-dose beta-blocker-we will reduce the dose to 25 mg twice daily since he is also on midodrine.  He does have amiodarone for rhythm control which also provides beta-blockade.  On high-dose high intensity statin  On DAPT x1  year.  -> I told him to watch out for symptoms of chest pain, but otherwise I would like to monitor for least another month for him to regain some strength as far as recovery time and at that time we will see him back to discuss timing of PCI of the RCA.  Would Consider Shockwave Lithotripsy to facilitate debulking and PCI of the RCA.  We will take pictures of the LCx lesion in the LAD and the same procedure to confirm LAD stent patency.  The LCx would be something that we would only do if symptoms are untenable.      Relevant Medications   midodrine (PROAMATINE) 5 MG tablet   heparin sodium, porcine, 1000 UNIT/ML injection   Metoprolol Tartrate 37.5 MG TABS   Presence of drug coated stent in LAD coronary artery: Resolute Onyx DES 2.75 mm x 18 mm (3.1 mm) at major D1 & SP1. (Chronic)   Chronic HFrEF (heart failure with reduced ejection fraction) (HCC) (Chronic)    His EF improved almost all the way to 4550% after MI.  He still has existing disease, I am not sure how much he would benefit from proceeding with PCI at this point.  I think he needs to regain some more strength. Only really noticing mild edema  Volume is being controlled with dialysis, he does need to allow for pressure levels behind after dialysis.  Plan: We will reduce his metoprolol dose.  Once we get him on a stable dose will probably switch him to Toprol that he will take at roughly lunchtime.  This will allow him to hold it on the morning of dialysis and take after dialysis. Does not have any blood pressure to consider afterload reduction. No Farxiga or Jardiance because of renal failure.       Relevant Medications   midodrine (PROAMATINE) 5 MG tablet   heparin sodium, porcine, 1000 UNIT/ML injection   Metoprolol Tartrate 37.5 MG TABS   Mitral regurgitation (Chronic)    He does have some ischemic MR, and I suspect that with RCA PCI this will improve.  There is also tethering because of calcification on the posterior  leaflet and possible. Thankfully now that he is volume overloaded and his EF is improved, his pressure is not significant elevated again.  EF is definitely better.  We can follow-up an echo if symptoms warrant, but otherwise will wait another year.      Relevant Medications   midodrine (PROAMATINE) 5 MG tablet   heparin sodium, porcine, 1000 UNIT/ML injection   Metoprolol Tartrate 37.5 MG TABS   ST elevation myocardial infarction (STEMI) involving left anterior descending (LAD) coronary artery with complication (HCC) (Chronic)    Large anterior MI complicated by 2 other existing vessel disease.  He had significant ectopy at the time of his MI.  With 3 potential culprit lesions I chose to fix the LAD.  Thankfully that initially stabilizes arrhythmia.  Subsequently developed post MI A. fib requiring aggressive management eventually after cardioversion, was placed on amiodarone after cardioversion.  We will relook at the LAD stent when he has his RCA PCI.      Relevant Medications   midodrine (PROAMATINE) 5 MG tablet   heparin sodium, porcine, 1000 UNIT/ML injection   Metoprolol Tartrate 37.5 MG TABS   Other Relevant Orders   EKG 12-Lead   Hyperlipidemia with target LDL less than 70 (Chronic)    Remains on high-dose high intensity statin.  Would need to have lipids checked in the next 2 months in order to adjust dosing.  He is eating better more healthy so I suspect that she will improve.      Relevant Medications   midodrine (PROAMATINE) 5 MG tablet   heparin sodium, porcine, 1000 UNIT/ML injection   Metoprolol Tartrate 37.5 MG TABS   Other Relevant Orders   EKG 12-Lead   Lipid panel   Hepatic function panel   Essential hypertension (Chronic)    Borderline hypotensive now. Plan: Reduce metoprolol to 25 mg twice daily plan is to hold the morning dose prior to dialysis.  If his pressures are still low, he can then take his nighttime dose earlier in the evening the night before  dialysis. Still requiring midodrine to keep his blood pressures up.      Relevant Medications   midodrine (PROAMATINE) 5 MG tablet   heparin sodium, porcine, 1000 UNIT/ML injection   Metoprolol Tartrate 37.5 MG TABS   Other Relevant Orders   EKG 12-Lead   PAF (paroxysmal atrial fibrillation) (HCC) (Chronic)    He had A. fib shortly after his MI requiring cardioversion on 2 occasions along with amiodarone infusion.  At this point since he also had significant ectopy at the time of his MI, I would like to keep the amiodarone on board.  Couple reasons being lack of significant drop in blood pressure with stronger rate and rhythm control the metoprolol.  He at this point would probably not tolerate A. fib until diastolic function of the heart has improved.  For now he is maintaining sinus rhythm with amiodarone.  I suspect we may build to reduce the dose to 100 g at the next visit. Also taking metoprolol which we can reduce the dose now-we will hope to switch to Toprol follow-up.  Not on DOAC because he is on ticagrelor alone.  I would like to keep him on ticagrelor for added benefit over Plavix, but once we consider adding DOAC, will convert to Plavix.  Amiodarone monitoring.      Relevant Medications   midodrine (PROAMATINE) 5 MG tablet   heparin sodium, porcine, 1000 UNIT/ML injection   Metoprolol Tartrate 37.5 MG TABS   Other Relevant Orders   EKG 12-Lead   Pneumonia of right lower lobe due to methicillin susceptible Staphylococcus aureus (MSSA) (HCC)    I think he was developing this bacterial infection or of the time he presented with MI.  Thankfully things seem to cleared relatively.  He seems almost back to his baseline level of breathing if not exertional.      Long term current use of amiodarone    He was very symptomatic when he is in A. fib in the hospital.  His MR was made dramatically worse.  For now I think maintaining sinus rhythm with amiodarone is reasonable.  Noted to  be switching him from Brilinta to Plavix plus DOAC likely between 6 months and a year out.        Overall plan: Have him return back in about a month to reassess symptoms.  At that time I think we can address the existing CAD plans for PCI.  We are backing off his beta-blocker dose to allow for more blood pressure.  Labs are due to be checked. Plan in future will be to reduce amiodarone dose to 100 mg hopefully in the next couple months. = He will need amiodarone monitoring.  ===================================  HPI:    Travis [name] is a 60 y.o. male with a complicated PMH notable below who presents today for post hospital follow-up.  KEITHEN WOOLSON was last seen on December 29 by Dr. Tresa Endo at the end of his hospitalization.  Plans were to consider RCA PCI at a later date.   Recent Hospitalizations:   10/28/2020-12/14/2020: Admitted with anterior STEMI-DES PCI LAD (plan was staged PCI of this RCA and medical management severely diseased AVG LCx) -> EDP was severely elevated at 30 mmHg. => Urgent HD later that day.  Was febrile overnight  11/21 -> A. fib RVR on shock, PEA arrest => driven by MSSA bacteremia; MSSA pneumonia => 6 weeks cefazolin  11/23: DCCV for recurrent A. Fib ==> ended up being intubated for Acute Respiratory Failure  11/24: Short-after to TEE, ; had brief bradycardic arrest  Course complicated by UGI bleed (severe esophagitis (14 units PRBC)  Short-term CRRT due to shock.  MSSA PNA with bilateral lung abscesses and empyema requiring chest tube insertion => after initial stabilization, the tube was dislodged,   worsening respiratory failure requiring reintubation on 12/19 ==> Severe metabolic encephalopathy ; Precedex drip for delirium.  Also had Stage IV Sacral Decubitus Wound.->  Required intermittent debridement.  Reviewed  CV studies:    The following studies were reviewed today: (if available, images/films reviewed: From Epic Chart or Care  Everywhere)   Cath PCI 11/20: Acute anterolateral STEMI -> MV CAD - Culprit = 99% mid LAD lesion (  just past major 1st Diag/D1); 99% ostial AV groove LCx and 80% calcified napkin ring prox RCA lesion with extensive calcification throughout the RCA. ? Successful PTCA and DES PCI of the LAD crossing D1 -resolute Onyx DES 2.75 mm x 18 mm postdilated to 3.1 mm.  Mild to moderate reduced EF with anterior anterolateral hypokinesis, EF roughly 40 to 45%.  LVEDP 30 mmHg, c/w ACUTE DIASTOLIC HEART FAILURE  Significantly dilated aortic root requiring AL-1 guide catheter for both Left and Right Coronary Angiography   TTE 10/31/2020: EF 35 to 40%. Moderate decreased function. Moderate concentric LVH GR 3 DD. Severe HK of the mid apical inferolateral and inferior wall with moderate HK of the apical septal and anterior wall. PA pressure estimated 43 mmHg. Severe LA dilation. Moderate RA dilation.Moderate to severe MR.Aortic root dilated at 41 mm. RV PSP 15 mmHg.   TEE 11/01/2020:EF 35-40% moderate decreased function. Severe LA dilation. No LAA thrombus. Moderate RA dilation. 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 Mild dilation of ascending aorta measuring 40 mm. Moderate grade 3 plaque..  TTE (limited) 12/6: EF 45-50%. Mid & distal Anterior Septum, mid inferoseptal & apical wall - hypokinetic. Severe Conc LVH> Mild MR.    TTE 11/26/2020: EF 45 to 50% with mildly decreased function - mid inferoseptal, apical septal & apical wall hypokinesis. . Moderate concentric LVH and indeterminate filling. Mild to moderate MR due to restricted PMV L with mitral annular calcification III B.   Interval History:   [name] CANCEL returns here today stating that he is slowly but surely getting stronger.  He had lost over 30 pounds during his hospital stay, and  has gained about 20 of it back.  He is eating better his appetite is definitely improved..  With that is gaining strength back.  He is doing physical therapy now still a bit unsteady with his gait, but able to walk on his own.  He walks outside-indicated that he walks about 100yards before he has to stop to gather his strength.  He denies any exertional chest tightness or pressure.  Just a little bit short of breath.  I do not know that he is strong enough yet to start cardiac rehab, but has voiced interest in moving forward.  Major issue is that his blood pressure tends to go down with hemodialysis, but sometimes after stop ultrafiltration for little while, sometimes they actually have to stop completely for an hour to let them rest and recuperate.  They have been restarted.  They are now having taking 2 doses midodrine prior to dialysis in the morning at noon.  Otherwise taking 1 tablet 3 times a day.  He asked about why he is on metoprolol and midodrine.  He is holding the metoprolol on the morning of dialysis.  He denies any PND or orthopnea, but does have some mild ankle and pedal edema at the end of the day.  No chest pain or pressure with rest or exertion to the extent that he is doing.  Denies any rapid rate heartbeats palpitations.  He may get a little lightheaded on occasion with standing, but denies any syncope or near syncope.  CV Review of Symptoms (Summary):  positive for - edema and Mild exertional dyspnea, mostly just fatigue negative for - chest pain, irregular heartbeat, orthopnea, palpitations, paroxysmal nocturnal dyspnea, rapid heart rate, shortness of breath or Syncope or near syncope, TIA  or amaurosis fugax.  Claudication  The patient does not have symptoms concerning for COVID-19 infection (fever, chills, cough, or new shortness of breath).   REVIEWED OF SYSTEMS   Review of Systems  Constitutional: Positive for malaise/fatigue (Energy level gradually coming back.  Still feels  very weak) and weight loss (Has gained back about 20 of the 40 pounds he lost.). Negative for fever.  HENT: Negative for congestion and nosebleeds.   Respiratory: Positive for shortness of breath (Still little short of breath with exertion, but is very deconditioned.  Thinks a lot of energy to do what he used to be able to do easily.). Negative for cough and sputum production.   Cardiovascular: Positive for leg swelling (Mild ankle edema).  Gastrointestinal: Negative for abdominal pain, blood in stool, constipation and melena.  Genitourinary: Negative for dysuria and hematuria.  Musculoskeletal:       His decubitus ulcer is bothering him some, not able to lie flat yet.  Been doing better.  Seeing wound care.  Neurological: Positive for weakness (Generalized). Negative for dizziness and focal weakness.  Psychiatric/Behavioral: Negative for depression and hallucinations. The patient is not nervous/anxious and does not have insomnia.    I have reviewed and (if needed) personally updated the patient's problem list, medications, allergies, past medical and surgical history, social and family history.   PAST MEDICAL HISTORY   Past Medical History:  Diagnosis Date  . Acute ST elevation myocardial infarction (STEMI) of anterolateral wall (HCC) 10/28/2020   Culprit lesion, 99% ulcerated mid LAD at D1 -> DES PCI  . Anemia of chronic disease 01/30/2015  . Chronic HFrEF (heart failure with reduced ejection fraction) (HCC) 10/28/2020   LVEDP in Cath 30-35 mmHg --> EF 45-50% with Inseminate filling  . Coronary artery disease involving native coronary artery of native heart with unstable angina pectoris (HCC) 10/28/2020   Acute anterolateral STEMI: 3V CAD: Culprit Lesion = 99% mid LAD lesion (just after D1) - DES PCI; 99% ostial AV groove LCx; & 80% calcified napkin ring proximal RCA lesion with extensive calcification throughout the RCA. Successful PTCA and DES PCI of the LAD crossing D1 -resolute Onyx DES  2.75 mm x 18 mm postdilated to 3.1 mm. With PCI, there  . End-stage renal disease on hemodialysis (HCC)   . GERD (gastroesophageal reflux disease)    takes Nexium  . Hyperlipidemia with target LDL less than 70 10/28/2020  . Hypertension   . Pneumonia    as a child  . Presence of drug coated stent in LAD coronary artery 10/28/2020   Proximal-mid LAD 99% ->0% -> Resolute Onyx DES 2.75 mm x 18 mm (3.1 mm)  . Sleep apnea    uses c-pap 2 yrs   -- 17 mm L Adrenal Nodule - > consider MRI Abdomen.  PAST SURGICAL HISTORY   Past Surgical History:  Procedure Laterality Date  . AV FISTULA PLACEMENT Left 12/22/2017   Procedure: REPAIR PSEUDOANEURYSM ARTERIOVENOUS (AV) FISTULA;  Surgeon: Larina Earthly, MD;  Location: [facility] OR;  Service: Vascular;  Laterality: Left;  . BASCILIC VEIN TRANSPOSITION Left 01/05/2014   Procedure: LEFT 1ST STAGE BASCILIC VEIN TRANSPOSITION;  Surgeon: Fransisco Hertz, MD;  Location: [facility] OR;  Service: Vascular;  Laterality: Left;  . BASCILIC VEIN TRANSPOSITION Left 07/26/2014   Procedure: Left Arm Brachial Vein Transposition Second Stage;  Surgeon: Fransisco Hertz, MD;  Location: [facility] OR;  Service: Vascular;  Laterality: Left;  . COLONOSCOPY    . COLONOSCOPY N/A 02/02/2015  Procedure: COLONOSCOPY;  Surgeon: Willis Modena, MD;  Location: [facility] ENDOSCOPY;  Service: Endoscopy;  Laterality: N/A;  . CORONARY STENT INTERVENTION N/A 10/28/2020   Procedure: CORONARY STENT INTERVENTION;  Surgeon: Marykay Lex, MD;  Location: [facility] INVASIVE CV LAB;  Successful PTCA and DES PCI of the LAD crossing D1 -resolute Onyx DES 2.75 mm x 18 mm postdilated to 3.1 mm. -- 99% TO 0%  . CORONARY/GRAFT ACUTE MI REVASCULARIZATION N/A 10/28/2020   Procedure: Coronary/Graft Acute MI Revascularization;  Surgeon: Marykay Lex, MD;  Location: [facility] INVASIVE CV LAB;  Service: Cardiovascular;  mid LAD 99% DES PCI  . ESOPHAGOGASTRODUODENOSCOPY Left 01/31/2015   Procedure: ESOPHAGOGASTRODUODENOSCOPY (EGD);  Surgeon: Willis Modena, MD;  Location: [facility] ENDOSCOPY;  Service: Endoscopy;  Laterality: Left;  . ESOPHAGOGASTRODUODENOSCOPY N/A 11/09/2020   Procedure: ESOPHAGOGASTRODUODENOSCOPY (EGD);  Surgeon: Vida Rigger, MD;  Location: [facility] ENDOSCOPY;  Service: Endoscopy;  Laterality: N/A;  . EVALUATION UNDER ANESTHESIA WITH HEMORRHOIDECTOMY N/A 02/26/2018   Procedure: ANORECTAL EXAM UNDER ANESTHESIA  HEMORRHOIDECTOMY x 2  HEMORRHOIDAL LIGATION AND PEXY;  Surgeon: Karie Soda, MD;  Location: [facility] ORS;  Service: General;  Laterality: N/A;  . GIVENS CAPSULE STUDY N/A 01/31/2015   Procedure: GIVENS CAPSULE STUDY;  Surgeon: Willis Modena, MD;  Location: [facility] ENDOSCOPY;  Service: Endoscopy;  Laterality: N/A;  . IR AV DIALY SHUNT INTRO NEEDLE/INTRAC INITIAL W/PTA/STENT/IMG LT Left 11/20/2020  . IR US GUIDE VASC ACCESS LEFT  11/20/2020  . LEFT HEART CATH AND CORONARY ANGIOGRAPHY N/A 10/28/2020   Procedure: LEFT HEART CATH AND CORONARY ANGIOGRAPHY;  Surgeon: Marykay Lex, MD;  Location: [facility] INVASIVE CV LAB;  Service: Cardiovascular; - Acute Ant-Lat STEMI-> MV CAD: Culprit = 99% mLAD (just after major D1) => DES PCI; 99% ostial AVG LCx (poor PCI target), 80% prox RCA napkin ring lesion.  EF ~40-45% (anterior - ant-lat HK). LVEDP 30 mmHg. (Used AL1 Guide for both LCA & RCA).  . MOUTH SURGERY     teeth cleaning  . NEPHRECTOMY RADICAL Right 05/2015   [facility] Dr Celso Sickle for R renal mass  . TRANSESOPHAGEAL ECHOCARDIOGRAM  11/01/2020   EF 35-40% moderate decreased function. Severe LA dilation. No LAA thrombus. Moderate RA dilation. 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 Mild dilation of ascending aorta measuring 40 mm. Moderate grade 3 plaque..  . TRANSTHORACIC ECHOCARDIOGRAM  10/31/2020   -POST ANT STEMI (& CARDIAC ARREST) -  EF 35 to 40%. Moderate decreased function. Moderate concentric LVH GR  3 DD. Severe HK of the mid apical inferolateral and inferior wall with moderate HK of the apical septal and anterior wall. PA pressure estimated 43 mmHg. Severe LA dilation. Moderate RA dilation.Moderate to severe MR.Aortic root dilated at 41 mm. RV PSP 15 mmHg.  Marland Kitchen TRANSTHORACIC ECHOCARDIOGRAM  11/26/2020   EF 45 to 50% with mildly decreased function - mid inferoseptal, apical septal & apical wall hypokinesis. . Moderate concentric LVH and indeterminate filling. Mild to moderate MR due to restricted PMV L with mitral annular calcification III B.    Immunization History  Administered Date(s) Administered  . Influenza-Unspecified 11/29/2014  . PFIZER(Purple Top)SARS-COV-2 Vaccination 09/05/2020  . Pneumococcal-Unspecified 11/29/2014    MEDICATIONS/ALLERGIES   Current Meds  Medication Sig  . acetaminophen (TYLENOL) 325 MG tablet Take 650 mg by mouth every 6 (six) hours as needed for mild pain, fever or headache.  Marland Kitchen amiodarone (PACERONE) 200 MG tablet Take 1 tablet (  200 mg total) by mouth daily.  Marland Kitchen atorvastatin (LIPITOR) 80 MG tablet Take 1 tablet (80 mg total) by mouth daily at 6 PM.  . AURYXIA 1 GM 210 MG(Fe) tablet Take 210-630 mg by mouth See admin instructions. 630mg  three times daily with meals and 210-420mg  with snacks  . cinacalcet (SENSIPAR) 60 MG tablet Take 120 mg by mouth daily.  . heparin sodium, porcine, 1000 UNIT/ML injection Heparin (Porcine) Preserv Free 1,000 Units/mL Systemic  . Methoxy PEG-Epoetin Beta (MIRCERA IJ) Mircera  . midodrine (PROAMATINE) 5 MG tablet Take by mouth.  . multivitamin (RENA-VIT) TABS tablet Take 1 tablet by mouth daily.  . multivitamin-iron-minerals-folic acid (CENTRUM) chewable tablet Chew 1 tablet by mouth daily.  . ticagrelor (BRILINTA) 90 MG TABS tablet Take 1 tablet (90 mg total) by mouth 2 (two) times daily.  . Vitamin D, Ergocalciferol, (DRISDOL) 1.25 MG (50000 UNIT) CAPS capsule Take 50,000 Units by mouth once a week.    Allergies   Allergen Reactions  . Ibuprofen Hives  . Lisinopril Swelling    PT states he is allergic to all prils; caused facial swelling  . Naproxen Hives and Other (See Comments)    Alleve causes patient to have hives    SOCIAL HISTORY/FAMILY HISTORY   Reviewed in Epic:  Pertinent findings:  Social History   Tobacco Use  . Smoking status: Former Smoker    Years: 4.00    Types: Cigars    Quit date: 12/31/2012    Years since quitting: 8.0  . Smokeless tobacco: Never Used  Vaping Use  . Vaping Use: Never used  Substance Use Topics  . Alcohol use: No  . Drug use: No   Social History   Social History Narrative  . Not on file    OBJCTIVE -PE, EKG, labs   Wt Readings from Last 3 Encounters:  01/15/21 178 lb 9.6 oz (81 kg)  12/14/20 160 lb 15 oz (73 kg)  02/26/18 209 lb 6 oz (95 kg)    Physical Exam: BP 103/64 (BP Location: Right Arm, Patient Position: Sitting)   Pulse 64   Ht 5\' 11"  (1.803 m)   Wt 178 lb 9.6 oz (81 kg)   SpO2 99%   BMI 24.91 kg/m  Physical Exam Constitutional:      General: He is not in acute distress.    Appearance: He is normal weight. He is ill-appearing (Somewhat chronically ill-appearing). He is not toxic-appearing.     Comments: Still somewhat frail and deconditioned but stronger than expected.  HENT:     Head: Normocephalic and atraumatic.  Neck:     Vascular: No carotid bruit (Just radiated berry from his fistula).  Cardiovascular:     Rate and Rhythm: Normal rate and regular rhythm.     Pulses: Normal pulses.     Heart sounds: Murmur (To and fro murmur which is likely radiated from fistula.  Has the sound of an aortic regurgitation murmur.) heard.  No friction rub. No gallop.   Pulmonary:     Effort: Pulmonary effort is normal. No respiratory distress.     Comments: Mildly decreased bilateral breath sounds but with no obvious rales or rhonchi.  No wheezing. Chest:     Chest wall: No tenderness.  Abdominal:     General: Bowel sounds are  normal.     Palpations: Abdomen is soft. There is no mass.     Tenderness: There is no abdominal tenderness. There is no rebound.     Comments: No  HSM  Musculoskeletal:        General: Swelling (Trivial bilateral ankle) present. Normal range of motion.     Cervical back: Normal range of motion and neck supple.  Skin:    General: Skin is warm and dry.  Neurological:     General: No focal deficit present.     Mental Status: He is alert and oriented to person, place, and time.     Cranial Nerves: No cranial nerve deficit.     Motor: Weakness (Generalized, but stronger than expected) present.     Gait: Gait abnormal (Slow and steady).  Psychiatric:        Mood and Affect: Mood normal.        Behavior: Behavior normal.        Thought Content: Thought content normal.        Judgment: Judgment normal.     Comments: Seems to be in very good spirits, all things considered.     Adult ECG Report  Rate: 64;  Rhythm: normal sinus rhythm and 1  AVB, left axis deviation.  Anterior MI, age indeterminate;   Narrative Interpretation: Stable EKG.  Recent Labs: Reviewed Lab Results  Component Value Date   CHOL 151 10/29/2020   HDL 13 (L) 10/29/2020   LDLCALC 61 10/29/2020   TRIG 71 11/30/2020   CHOLHDL 11.6 10/29/2020   Lab Results  Component Value Date   CREATININE 7.27 (H) 12/13/2020   BUN 71 (H) 12/13/2020   NA 137 12/13/2020   K 3.8 12/13/2020   CL 103 12/13/2020   CO2 22 12/13/2020   CBC Latest Ref Rng & Units 12/13/2020 12/11/2020 12/10/2020  WBC 4.0 - 10.5 K/uL 11.8(H) 12.3(H) 12.8(H)  Hemoglobin 13.0 - 17.0 g/dL 7.8(L) 7.4(L) 7.8(L)  Hematocrit 39.0 - 52.0 % 25.9(L) 24.3(L) 26.1(L)  Platelets 150 - 400 K/uL 339 321 330    Lab Results  Component Value Date   TSH 0.882 01/30/2015    ==================================================  COVID-19 Education: The signs and symptoms of COVID-19 were discussed with the patient and how to seek care for testing (follow up with PCP or  arrange E-visit).   The importance of social distancing and COVID-19 vaccination was discussed today. The patient is practicing social distancing & Masking.   I spent a total of with the patient spent in direct patient consultation.  Additional time spent with chart review  / charting (studies, outside notes, etc): 28 min Total Time: 58 min   Current medicines are reviewed at length with the patient today.  (+/- concerns) question why he is taking midodrine and metoprolol together.  We discussed reasons recurrent.  I am cutting the dose of metoprolol now.  Hopefully as time goes by he will not require midodrine.  This visit occurred during the SARS-CoV-2 public health emergency.  Safety protocols were in place, including screening questions prior to the visit, additional usage of staff PPE, and extensive cleaning of exam room while observing appropriate contact time as indicated for disinfecting solutions.  Notice: This dictation was prepared with Dragon dictation along with smaller phrase technology. Any transcriptional errors that result from this process are unintentional and may not be corrected upon review.  Patient Instructions / Medication Changes & Studies & Tests Ordered   Patient Instructions  Medication Instructions:  Change Metoprolol to 25mg  twice daily.  -On the nights before dialysis take evening dose of metoprolol at 6pm.  If low blood pressure continues give our office a call. You may need  to skip this dose prior to dialysis.   *If you need a refill on your cardiac medications before your next appointment, please call your pharmacy*   Lab Work: Your physician recommends that you return for lab work in: the next 1-2 weeks for lipid/liver profile.  If you have labs (blood work) drawn today and your tests are completely normal, you will receive your results only by: Marland Kitchen MyChart Message (if you have MyChart) OR . A paper copy in the mail If you have any lab test  that is abnormal or we need to change your treatment, we will call you to review the results.   Follow-Up: At [facility], you and your health needs are our priority.  As part of our continuing mission to provide you with exceptional heart care, we have created designated Provider Care Teams.  These Care Teams include your primary Cardiologist (physician) and Advanced Practice Providers (APPs -  Physician Assistants and Nurse Practitioners) who all work together to provide you with the care you need, when you need it.  We recommend signing up for the patient portal called "MyChart".  Sign up information is provided on this After Visit Summary.  MyChart is used to connect with patients for Virtual Visits (Telemedicine).  Patients are able to view lab/test results, encounter notes, upcoming appointments, etc.  Non-urgent messages can be sent to your provider as well.   To learn more about what you can do with MyChart, go to [email or web address].    Your next appointment:   1 month(s)  The format for your next appointment:   In Person  Provider:   Bryan Lemma, MD     Studies Ordered:   Orders Placed This Encounter  Procedures  . Lipid panel  . Hepatic function panel  . EKG 12-Lead     Bryan Lemma, M.D., M.S. Interventional Cardiologist   Pager # [phone] Phone # [phone] [address] [place], [place] [address]   Thank you for choosing Heartcare at [facility]!!

## 2021-01-15 ENCOUNTER — Other Ambulatory Visit: Payer: Self-pay

## 2021-01-15 ENCOUNTER — Ambulatory Visit (INDEPENDENT_AMBULATORY_CARE_PROVIDER_SITE_OTHER): Payer: Medicare Other | Admitting: Cardiology

## 2021-01-15 ENCOUNTER — Encounter: Payer: Self-pay | Admitting: Cardiology

## 2021-01-15 VITALS — BP 103/64 | HR 64 | Ht 71.0 in | Wt 178.6 lb

## 2021-01-15 DIAGNOSIS — Z992 Dependence on renal dialysis: Secondary | ICD-10-CM

## 2021-01-15 DIAGNOSIS — I5022 Chronic systolic (congestive) heart failure: Secondary | ICD-10-CM

## 2021-01-15 DIAGNOSIS — I2102 ST elevation (STEMI) myocardial infarction involving left anterior descending coronary artery: Secondary | ICD-10-CM

## 2021-01-15 DIAGNOSIS — I480 Paroxysmal atrial fibrillation: Secondary | ICD-10-CM | POA: Diagnosis not present

## 2021-01-15 DIAGNOSIS — I10 Essential (primary) hypertension: Secondary | ICD-10-CM

## 2021-01-15 DIAGNOSIS — Z79899 Other long term (current) drug therapy: Secondary | ICD-10-CM | POA: Insufficient documentation

## 2021-01-15 DIAGNOSIS — N186 End stage renal disease: Secondary | ICD-10-CM

## 2021-01-15 DIAGNOSIS — E785 Hyperlipidemia, unspecified: Secondary | ICD-10-CM | POA: Diagnosis not present

## 2021-01-15 DIAGNOSIS — Z955 Presence of coronary angioplasty implant and graft: Secondary | ICD-10-CM

## 2021-01-15 DIAGNOSIS — I25110 Atherosclerotic heart disease of native coronary artery with unstable angina pectoris: Secondary | ICD-10-CM | POA: Diagnosis not present

## 2021-01-15 DIAGNOSIS — J15211 Pneumonia due to Methicillin susceptible Staphylococcus aureus: Secondary | ICD-10-CM

## 2021-01-15 DIAGNOSIS — I340 Nonrheumatic mitral (valve) insufficiency: Secondary | ICD-10-CM

## 2021-01-15 MED ORDER — METOPROLOL TARTRATE 37.5 MG PO TABS: 25.0000 mg | ORAL_TABLET | Freq: Two times a day (BID) | ORAL | 3 refills | Status: DC

## 2021-01-15 NOTE — Assessment & Plan Note (Signed)
Borderline hypotensive now. Plan: Reduce metoprolol to 25 mg twice daily plan is to hold the morning dose prior to dialysis.  If his pressures are still low, he can then take his nighttime dose earlier in the evening the night before dialysis. Still requiring midodrine to keep his blood pressures up.

## 2021-01-15 NOTE — Assessment & Plan Note (Signed)
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  He had significant ectopy at the time of his MI.  With 3 potential culprit lesions I chose to fix the LAD.  Thankfully that initially stabilizes arrhythmia.  Subsequently developed post MI A. fib requiring aggressive management eventually after cardioversion, was placed on amiodarone after cardioversion.  We will relook at the LAD stent when he has his RCA PCI.

## 2021-01-15 NOTE — Assessment & Plan Note (Signed)
I think he was developing this bacterial infection or of the time he presented with MI.  Thankfully things seem to cleared relatively.  He seems almost back to his baseline level of breathing if not exertional.

## 2021-01-15 NOTE — Assessment & Plan Note (Signed)
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  There is also tethering because of calcification on the posterior leaflet and possible. Thankfully now that he is volume overloaded and his EF is improved, his pressure is not significant elevated again.  EF is definitely better.  We can follow-up an echo if symptoms warrant, but otherwise will wait another year.

## 2021-01-15 NOTE — Assessment & Plan Note (Signed)
His EF improved almost all the way to 4550% after MI.  He still has existing disease, I am not sure how much he would benefit from proceeding with PCI at this point.  I think he needs to regain some more strength. Only really noticing mild edema  Volume is being controlled with dialysis, he does need to allow for pressure levels behind after dialysis.  Plan: We will reduce his metoprolol dose.  Once we get him on a stable dose will probably switch him to Toprol that he will take at roughly lunchtime.  This will allow him to hold it on the morning of dialysis and take after dialysis. Does not have any blood pressure to consider afterload reduction. No Farxiga or Jardiance because of renal failure.

## 2021-01-15 NOTE — Assessment & Plan Note (Signed)
He had A. fib shortly after his MI requiring cardioversion on 2 occasions along with amiodarone infusion.  At this point since he also had significant ectopy at the time of his MI, I would like to keep the amiodarone on board.  Couple reasons being lack of significant drop in blood pressure with stronger rate and rhythm control the metoprolol.  He at this point would probably not tolerate A. fib until diastolic function of the heart has improved.  For now he is maintaining sinus rhythm with amiodarone.  I suspect we may build to reduce the dose to 100 g at the next visit. Also taking metoprolol which we can reduce the dose now-we will hope to switch to Toprol follow-up.  Not on DOAC because he is on ticagrelor alone.  I would like to keep him on ticagrelor for added benefit over Plavix, but once we consider adding DOAC, will convert to Plavix.  Amiodarone monitoring.

## 2021-01-15 NOTE — Assessment & Plan Note (Signed)
He was very symptomatic when he is in A. fib in the hospital.  His MR was made dramatically worse.  For now I think maintaining sinus rhythm with amiodarone is reasonable.  Noted to be switching him from Brilinta to Plavix plus DOAC likely between 6 months and a year out.

## 2021-01-15 NOTE — Assessment & Plan Note (Signed)
Remains on high-dose high intensity statin.  Would need to have lipids checked in the next 2 months in order to adjust dosing.  He is eating better more healthy so I suspect that she will improve.

## 2021-01-15 NOTE — Progress Notes (Signed)
Travis, [name] ([id number]) Visit Report for 01/12/2021 Arrival Information Details Patient Name: Date of Service: Travis [name] 01/12/2021 2:00 PM Medical Record Number: [id number] Patient Account Number: [id number] Date of Birth/Sex: Treating RN: 1961/01/29 (60 y.o. Travis [name] Primary Care Sharone Picchi: London Pepper Other Clinician: Referring Earla Charlie: Treating Tayana Shankle/Extender: Marlou Sa in Treatment: 2 Visit Information History Since Last Visit Added or deleted any medications: No Patient Arrived: Travis [name] Any new allergies or adverse reactions: No Arrival Time: 14:20 Had a fall or experienced change in No Accompanied By: wife activities of daily living that may affect Transfer Assistance: None risk of falls: Patient Identification Verified: Yes Signs or symptoms of abuse/neglect since last visito No Secondary Verification Process Completed: Yes Hospitalized since last visit: No Patient Requires Transmission-Based Precautions: No Implantable device outside of the clinic excluding No Patient Has Alerts: No cellular tissue based products placed in the center since last visit: Has Dressing in Place as Prescribed: Yes Pain Present Now: Yes Electronic Signature(s) Signed: 01/15/2021 9:06:40 AM By: Sandre Kitty Entered By: Sandre Kitty on 01/12/2021 14:21:02 -------------------------------------------------------------------------------- Encounter Discharge Information Details Patient Name: Date of Service: Travis [name], [place]. 01/12/2021 2:00 PM Medical Record Number: [id number] Patient Account Number: [id number] Date of Birth/Sex: Treating RN: [birth year] (60 y.o. Hessie Diener Primary Care Travis [name]: London Pepper Other Clinician: Referring Travis [name]: Treating Travis [name]/Extender: Marlou Sa in Treatment: 2 Encounter Discharge Information Items Discharge Condition: Stable Ambulatory Status:  Cane Discharge Destination: Home Transportation: Private Auto Accompanied By: wife Schedule Follow-up Appointment: Yes Clinical Summary of Care: Electronic Signature(s) Signed: 01/12/2021 4:56:10 PM By: Deon Pilling Entered By: Deon Pilling on 01/12/2021 16:53:34 -------------------------------------------------------------------------------- Multi Wound Chart Details Patient Name: Date of Service: Travis [name], Travis [name]. 01/12/2021 2:00 PM Medical Record Number: [id number] Patient Account Number: [id number] Date of Birth/Sex: Treating RN: [birth year] (60 y.o. Travis [name], Travis [name] Primary Care Bradlee Heitman: London Pepper Other Clinician: Referring Travis [name]: Treating Travis [name]/Extender: Marlou Sa in Treatment: 2 Vital Signs Height(in): 71 Pulse(bpm): 3 Weight(lbs): 160 Blood Pressure(mmHg): 103/63 Body Mass Index(BMI): 22 Temperature(F): 98.3 Respiratory Rate(breaths/min): 18 Photos: [1:No Photos Sacrum] [N/A:N/A N/A] Wound Location: [1:Pressure Injury] [N/A:N/A] Wounding Event: [1:Pressure Ulcer] [N/A:N/A] Primary Etiology: [1:Sleep Apnea, Coronary Artery] [N/A:N/A] Comorbid History: [1:Disease, Hypertension, Myocardial Infarction, End Stage Renal Disease, Gout 11/08/2020] [N/A:N/A] Date Acquired: [1:2] [N/A:N/A] Weeks of Treatment: [1:Open] [N/A:N/A] Wound Status: [1:6.5x4.5x1.5] [N/A:N/A] Measurements L x W x D (cm) [1:22.973] [N/A:N/A] A (cm) : rea [1:34.459] [N/A:N/A] Volume (cm) : [1:24.50%] [N/A:N/A] % Reduction in A rea: [1:43.40%] [N/A:N/A] % Reduction in Volume: [1:Category/Stage IV] [N/A:N/A] Classification: [1:Large] [N/A:N/A] Exudate A mount: [1:Serosanguineous] [N/A:N/A] Exudate Type: [1:red, brown] [N/A:N/A] Exudate Color: [1:Well defined, not attached] [N/A:N/A] Wound Margin: [1:Small (1-33%)] [N/A:N/A] Granulation A mount: [1:Red, Pink] [N/A:N/A] Granulation Quality: [1:Large (67-100%)] [N/A:N/A] Necrotic A mount: [1:Fat Layer  (Subcutaneous Tissue): Yes N/A] Exposed Structures: [1:Fascia: No Tendon: No Muscle: No Joint: No Bone: No None] [N/A:N/A] Epithelialization: [1:Negative Pressure Wound Therapy] [N/A:N/A] Procedures Performed: [1:Application (NPWT)] Treatment Notes Electronic Signature(s) Signed: 01/12/2021 4:22:00 PM By: [name] MD Signed: 01/12/2021 4:47:51 PM By: Baruch Gouty RN, BSN Entered By: [name] on 01/12/2021 15:23:07 -------------------------------------------------------------------------------- Multi-Disciplinary Care Plan Details Patient Name: Date of Service: Travis [name], Travis [place] EL C. 01/12/2021 2:00 PM Medical Record Number: [id number] Patient Account Number: [id number] Date of Birth/Sex: Treating RN: December 25, 1960 (60 y.o. Travis [name] Primary Care Topher Buenaventura: Other Clinician: London Pepper Referring Lucie Friedlander: Treating Jeyden Coffelt/Extender: Marlou Sa in  Treatment: 2 Active Inactive Wound/Skin Impairment Nursing Diagnoses: Impaired tissue integrity Knowledge deficit related to ulceration/compromised skin integrity Goals: Patient will have a decrease in wound volume by X% from date: (specify in notes) Date Initiated: 12/26/2020 Date Inactivated: 01/12/2021 Target Resolution Date: 01/12/2021 Goal Status: Met Patient/caregiver will verbalize understanding of skin care regimen Date Initiated: 12/26/2020 Target Resolution Date: 02/09/2021 Goal Status: Active Ulcer/skin breakdown will have a volume reduction of 30% by week 4 Date Initiated: 12/26/2020 Date Inactivated: 01/12/2021 Target Resolution Date: 01/12/2021 Goal Status: Met Ulcer/skin breakdown will have a volume reduction of 50% by week 8 Date Initiated: 01/12/2021 Target Resolution Date: 02/09/2021 Goal Status: Active Interventions: Assess patient/caregiver ability to obtain necessary supplies Assess patient/caregiver ability to perform ulcer/skin care regimen upon admission and as needed Assess  ulceration(s) every visit Notes: Electronic Signature(s) Signed: 01/12/2021 4:47:51 PM By: Baruch Gouty RN, BSN Entered By: Baruch Gouty on 01/12/2021 14:55:07 -------------------------------------------------------------------------------- Negative Pressure Wound Therapy Application (NPWT) Details Patient Name: Date of Service: Travis [name] 01/12/2021 2:00 PM Medical Record Number: [id number] Patient Account Number: [id number] Date of Birth/Sex: Treating RN: 08-28-1961 (60 y.o. Travis [name] Primary Care Tonique Mendonca: London Pepper Other Clinician: Referring Shaheen Star: Treating Kamarri Fischetti/Extender: Marlou Sa in Treatment: 2 NPWT Application Performed for: Wound #1 Sacrum Performed By: Deon Pilling, RN Type: VAC System Coverage Size (sq cm): 29.25 Pressure Type: Constant Pressure Setting: 125 mmHG Drain Type: None Quantity of Sponges/Gauze Inserted: 1 Sponge/Dressing Type: Foam, Black Date Initiated: 01/01/2021 Response to Treatment: good Post Procedure Diagnosis Same as Pre-procedure Electronic Signature(s) Signed: 01/12/2021 4:47:51 PM By: Baruch Gouty RN, BSN Entered By: Baruch Gouty on 01/12/2021 15:02:24 -------------------------------------------------------------------------------- Pain Assessment Details Patient Name: Date of Service: Travis [name], [place]. 01/12/2021 2:00 PM Medical Record Number: [id number] Patient Account Number: [id number] Date of Birth/Sex: Treating RN: [birth year] (60 y.o. Travis [name] Primary Care Penelopi Mikrut: London Pepper Other Clinician: Referring Ancelmo Hunt: Treating Abbigail Anstey/Extender: Marlou Sa in Treatment: 2 Active Problems Location of Pain Severity and Description of Pain Patient Has Paino Yes Site Locations Rate the pain. Current Pain Level: 3 Pain Management and Medication Current Pain Management: Electronic Signature(s) Signed: 01/12/2021 4:47:51 PM By:  Baruch Gouty RN, BSN Signed: 01/15/2021 9:06:40 AM By: Sandre Kitty Entered By: Sandre Kitty on 01/12/2021 14:21:35 -------------------------------------------------------------------------------- Patient/Caregiver Education Details Patient Name: Date of Service: Travis [name] 2/4/2022andnbsp2:00 PM Medical Record Number: [id number] Patient Account Number: [id number] Date of Birth/Gender: Treating RN: 1960/12/28 (60 y.o. Travis [name] Primary Care Physician: London Pepper Other Clinician: Referring Physician: Treating Physician/Extender: Marlou Sa in Treatment: 2 Education Assessment Education Provided To: Patient Education Topics Provided Pressure: Methods: Explain/Verbal Responses: Reinforcements needed, State content correctly Wound/Skin Impairment: Methods: Explain/Verbal Responses: Reinforcements needed, State content correctly Electronic Signature(s) Signed: 01/12/2021 4:47:51 PM By: Baruch Gouty RN, BSN Entered By: Baruch Gouty on 01/12/2021 14:55:29 -------------------------------------------------------------------------------- Wound Assessment Details Patient Name: Date of Service: Travis [name], [place] C. 01/12/2021 2:00 PM Medical Record Number: [id number] Patient Account Number: [id number] Date of Birth/Sex: Treating RN: February 12, 1961 (60 y.o. Burnadette Pop, Lauren Primary Care Dov Dill: London Pepper Other Clinician: Referring Jonisha Kindig: Treating Haylo Fake/Extender: Marlou Sa in Treatment: 2 Wound Status Wound Number: 1 Primary Pressure Ulcer Etiology: Wound Location: Sacrum Wound Open Wounding Event: Pressure Injury Status: Date Acquired: 11/08/2020 Comorbid Sleep Apnea, Coronary Artery Disease, Hypertension, Myocardial Weeks Of Treatment: 2 History: Infarction, End Stage Renal Disease, Gout Clustered Wound: No Wound Measurements Length: (cm)  6.5 Width: (cm) 4.5 Depth: (cm)  1.5 Area: (cm) 22.973 Volume: (cm) 34.459 % Reduction in Area: 24.5% % Reduction in Volume: 43.4% Epithelialization: None Tunneling: No Undermining: No Wound Description Classification: Category/Stage IV Wound Margin: Well defined, not attached Exudate Amount: Large Exudate Type: Serosanguineous Exudate Color: red, brown Foul Odor After Cleansing: No Slough/Fibrino Yes Wound Bed Granulation Amount: Small (1-33%) Exposed Structure Granulation Quality: Red, Pink Fascia Exposed: No Necrotic Amount: Large (67-100%) Fat Layer (Subcutaneous Tissue) Exposed: Yes Necrotic Quality: Adherent Slough Tendon Exposed: No Muscle Exposed: No Joint Exposed: No Bone Exposed: No Treatment Notes Wound #1 (Sacrum) Cleanser Soap and Water Discharge Instruction: May shower and wash wound with dial antibacterial soap and water prior to dressing change. Wound Cleanser Discharge Instruction: Cleanse the wound with wound cleanser prior to applying a clean dressing using gauze sponges, not tissue or cotton balls. Peri-Wound Care Skin Prep Discharge Instruction: Use skin prep as directed Topical Primary Dressing FIBRACOL Plus Dressing, 2x2 in (collagen) Discharge Instruction: Moisten collagen with saline or hydrogel. Be sure to pack collagen into undermining.place black foam over collagen. Secondary Dressing Secured With Compression Wrap Compression Stockings Add-Ons Notes negative pressure 110mHg continuous x1 black foam dressing bridged to left hip. Electronic Signature(s) Signed: 01/15/2021 9:09:54 AM By: BRhae HammockRN Entered By: BRhae Hammockon 01/12/2021 14:28:50 -------------------------------------------------------------------------------- Vitals Details Patient Name: Date of Service: KCruzita [name] [place] 01/12/2021 2:00 PM Medical Record Number: [id number] Account Number: [id number] of Birth/Sex: Treating RN: [birth year](60y.o. MErnestene MentionPrimary Care Tonnie Stillman: MLondon PepperOther Clinician: Referring Chesley Valls: Treating Cassell Voorhies/Extender: RMarlou Sain Treatment: 2 Vital Signs Time Taken: 14:21 Temperature (F): 98.3 Height (in): 71 Pulse (bpm): 67 Weight (lbs): 160 Respiratory Rate (breaths/min): 18 Body Mass Index (BMI): 22.3 Blood Pressure (mmHg): 103/63 Reference Range: 80 - 120 mg / dl Electronic Signature(s) Signed: 01/15/2021 9:06:40 AM By: DSandre KittyEntered By: DSandre Kittyon 01/12/2021 14:21:24

## 2021-01-15 NOTE — Patient Instructions (Signed)
Medication Instructions:  Change Metoprolol to 25mg  twice daily.  -On the nights before dialysis take evening dose of metoprolol at 6pm.  If low blood pressure continues give our office a call. You may need to skip this dose prior to dialysis.   *If you need a refill on your cardiac medications before your next appointment, please call your pharmacy*   Lab Work: Your physician recommends that you return for lab work in: the next 1-2 weeks for lipid/liver profile.  If you have labs (blood work) drawn today and your tests are completely normal, you will receive your results only by: Marland Kitchen MyChart Message (if you have MyChart) OR . A paper copy in the mail If you have any lab test that is abnormal or we need to change your treatment, we will call you to review the results.   Follow-Up: At [facility], you and your health needs are our priority.  As part of our continuing mission to provide you with exceptional heart care, we have created designated Provider Care Teams.  These Care Teams include your primary Cardiologist (physician) and Advanced Practice Providers (APPs -  Physician Assistants and Nurse Practitioners) who all work together to provide you with the care you need, when you need it.  We recommend signing up for the patient portal called "MyChart".  Sign up information is provided on this After Visit Summary.  MyChart is used to connect with patients for Virtual Visits (Telemedicine).  Patients are able to view lab/test results, encounter notes, upcoming appointments, etc.  Non-urgent messages can be sent to your provider as well.   To learn more about what you can do with MyChart, go to [email or web address].    Your next appointment:   1 month(s)  The format for your next appointment:   In Person  Provider:   Glenetta Hew, MD

## 2021-01-15 NOTE — Assessment & Plan Note (Signed)
Hypotension during dialysis.  At this point dialysis is extremely important.  He is using midodrine to support blood pressure.  I think we can reduce his metoprolol dose 25 mg twice daily and hold 1 dose of dialysis as well as the evening dose prior to HD.

## 2021-01-15 NOTE — Assessment & Plan Note (Signed)
Status post PCI to the LAD in the setting of what looked like an anterior MI.  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 At this point I think the RCA is reasonable to work on-likely requiring debulking therapy.  I reviewed the images with several of my interventional cardiologist, I do not think that the AV groove LCx is a very good PCI target.  Be very difficult to land the stent and not impinge upon the major portion of the LCx. He is not having any active anginal symptoms, and is still quite weak.  Has not really started that much limiting exercise level.  Plan  Continue low-dose beta-blocker-we will reduce the dose to 25 mg twice daily since he is also on midodrine.  He does have amiodarone for rhythm control which also provides beta-blockade.  On high-dose high intensity statin  On DAPT x1 year.  -> I told him to watch out for symptoms of chest pain, but otherwise I would like to monitor for least another month for him to regain some strength as far as recovery time and at that time we will see him back to discuss timing of PCI of the RCA.  Would Consider Shockwave Lithotripsy to facilitate debulking and PCI of the RCA.  We will take pictures of the LCx lesion in the LAD and the same procedure to confirm LAD stent patency.  The LCx would be something that we would only do if symptoms are untenable.

## 2021-01-17 ENCOUNTER — Ambulatory Visit: Payer: Medicare Other | Admitting: Podiatry

## 2021-01-19 ENCOUNTER — Encounter (HOSPITAL_BASED_OUTPATIENT_CLINIC_OR_DEPARTMENT_OTHER): Payer: Medicare Other | Admitting: Internal Medicine

## 2021-01-19 ENCOUNTER — Ambulatory Visit: Payer: Medicare Other | Admitting: Podiatry

## 2021-01-19 ENCOUNTER — Other Ambulatory Visit: Payer: Self-pay

## 2021-01-19 DIAGNOSIS — M79674 Pain in right toe(s): Secondary | ICD-10-CM

## 2021-01-19 DIAGNOSIS — D689 Coagulation defect, unspecified: Secondary | ICD-10-CM | POA: Diagnosis not present

## 2021-01-19 DIAGNOSIS — M79675 Pain in left toe(s): Secondary | ICD-10-CM | POA: Diagnosis not present

## 2021-01-19 DIAGNOSIS — L84 Corns and callosities: Secondary | ICD-10-CM

## 2021-01-19 DIAGNOSIS — B351 Tinea unguium: Secondary | ICD-10-CM

## 2021-01-22 ENCOUNTER — Encounter (HOSPITAL_BASED_OUTPATIENT_CLINIC_OR_DEPARTMENT_OTHER): Payer: Medicare Other | Admitting: Internal Medicine

## 2021-01-23 ENCOUNTER — Telehealth: Payer: Self-pay | Admitting: Cardiology

## 2021-01-23 NOTE — Telephone Encounter (Signed)
*  STAT* If patient is at the pharmacy, call can be transferred to refill team.   1. Which medications need to be refilled? (please list name of each medication and dose if known) Metoprolol Tartrate 37.5 MG TABS  2. Which pharmacy/location (including street and city if local pharmacy) is medication to be sent to? [id number] - [place], [place]  3. Do they need a 30 day or 90 day supply? 90   Patient was put back on 25mg  but this prescription is for 37.5. Patient's wife would like a new prescription for 25mg  sent to his pharmacy so they can pick up tomorrow morning.

## 2021-01-24 ENCOUNTER — Other Ambulatory Visit: Payer: Self-pay | Admitting: *Deleted

## 2021-01-24 MED ORDER — METOPROLOL TARTRATE 25 MG PO TABS: 25.0000 mg | ORAL_TABLET | Freq: Two times a day (BID) | ORAL | 1 refills | Status: AC

## 2021-01-26 ENCOUNTER — Other Ambulatory Visit: Payer: Self-pay

## 2021-01-26 ENCOUNTER — Encounter (HOSPITAL_BASED_OUTPATIENT_CLINIC_OR_DEPARTMENT_OTHER): Payer: Medicare Other | Admitting: Internal Medicine

## 2021-01-26 DIAGNOSIS — L89154 Pressure ulcer of sacral region, stage 4: Secondary | ICD-10-CM | POA: Diagnosis not present

## 2021-01-26 NOTE — Progress Notes (Signed)
Travis, [name] ([id number]) Visit Report for 01/26/2021 Debridement Details Patient Name: Date of Service: Travis [name] 01/26/2021 8:45 A M Medical Record Number: [id number] Patient Account Number: [id number] Date of Birth/Sex: Treating RN: 29-Mar-1961 (60 y.o. Travis [name] Primary Care Provider: Dorthy Cooler, [place] Other Clinician: Referring Provider: Treating Provider/Extender: Marlou Sa in Treatment: 4 Debridement Performed for Assessment: Wound #1 Sacrum Performed By: Physician Ricard Dillon., MD Debridement Type: Debridement Level of Consciousness (Pre-procedure): Awake and Alert Pre-procedure Verification/Time Out Yes - 10:00 Taken: Start Time: 10:00 T Area Debrided (L x W): otal 3.3 (cm) x 1.2 (cm) = 3.96 (cm) Tissue and other material debrided: Viable, Non-Viable, Slough, Subcutaneous, Slough Level: Skin/Subcutaneous Tissue Debridement Description: Excisional Instrument: Curette Bleeding: Minimum Hemostasis Achieved: Pressure End Time: 10:01 Procedural Pain: 0 Post Procedural Pain: 0 Response to Treatment: Procedure was tolerated well Level of Consciousness (Post- Awake and Alert procedure): Post Debridement Measurements of Total Wound Length: (cm) 3.3 Stage: Category/Stage IV Width: (cm) 1.2 Depth: (cm) 0.9 Volume: (cm) 2.799 Character of Wound/Ulcer Post Debridement: Improved Post Procedure Diagnosis Same as Pre-procedure Electronic Signature(s) Signed: 01/26/2021 5:25:02 PM By: [name] MD Signed: 01/26/2021 5:33:43 PM By: Baruch Gouty RN, BSN Entered By: [name] on 01/26/2021 10:07:27 -------------------------------------------------------------------------------- HPI Details Patient Name: Date of Service: Travis [name], [place] EL C. 01/26/2021 8:45 A M Medical Record Number: [id number] Patient Account Number: [id number] Date of Birth/Sex: Treating RN: 08-25-1961 (60 y.o. Travis [name] Primary  Care Provider: Dorthy Cooler, [place] Other Clinician: Referring Provider: Treating Provider/Extender: Marlou Sa in Treatment: 4 History of Present Illness HPI Description: ADMISSION 12/26/2020 This is a 60 year old man who has chronic renal failure on dialysis presuming secondary to nephrosclerosis. He was admitted to hospital for 47 days from 10/28/2020 through 12/14/2020. Starting with some combination of an anterior ST elevation MI status post stent to the LAD septic shock secondary to MSSA. The source of the sepsis was never apparently determined. Had paroxysmal atrial fibrillation, and upper GI bleed, PEA cardiac arrests and then bilateral empyemas requiring bilateral chest tubes. His wife became aware during this exceedingly complex hospitalization of a pressure ulcer on his sacrum around December 4. They tell me that the mattress which was some form of air pressurized mattress deflated he was lying on a hard surface. He apparently had debridements of this while he was in hospital. He was discharged from hospital with wet-to-dry dressings that they are still doing and changing daily. He seems to have made some improvement. I cannot see any relevant imaging or cultures the patient makes rigorous attempts to offload this both at home and at dialysis. Is attempting to eat well with uses Juven for supplements. I note that prior to leaving the hospital understandably his albumin level was 2. 3. He arrives in clinic today with a wound measuring 6.8 x 5 x 2 cm. He has undermining from 11-5 o'clock at 2.6 cm. There is no evidence of surrounding soft tissue infection no crepitus. No palpable bone no purulent drainage. The granulation is slightly cobblestoned 2/4; patient has been using the wound VAC that we ordered last time. He has home health changing this. I do not think they applied the collagen under the VAC foam that we suggested last time. The undermining area from 11-5 o'clock  is filled in otherwise the surface of this looks about the same. There is a small tail to this that goes into the left buttock 2/18; collagen under the [facility]  foam. His undermining has completely gone away. Wound volume is considerably better. Electronic Signature(s) Signed: 01/26/2021 5:25:02 PM By: [name] MD Entered By: [name] on 01/26/2021 10:08:16 -------------------------------------------------------------------------------- Physical Exam Details Patient Name: Date of Service: Travis [name], Milly [name] EL C. 01/26/2021 8:45 A M Medical Record Number: [id number] Patient Account Number: [id number] Date of Birth/Sex: Treating RN: Jul 09, 1961 (60 y.o. Travis [name] Primary Care Provider: Dorthy Cooler, [place] Other Clinician: Referring Provider: Treating Provider/Extender: Marlou Sa in Treatment: 4 Constitutional Sitting or standing Blood Pressure is within target range for patient.. Pulse regular and within target range for patient.Marland Kitchen Respirations regular, non-labored and within target range.. Temperature is normal and within the target range for the patient.Marland Kitchen Appears in no distress. Notes Wound exam; the wound is come in remarkably well. Very fibrinous debris and hyper granulated granulation I am not back with a open curette. Hemostasis with direct pressure this looks really remarkably good no evidence of surrounding infection. There is no undermining area no probing tunnels no depth. Electronic Signature(s) Signed: 01/26/2021 5:25:02 PM By: [name] MD Entered By: [name] on 01/26/2021 10:09:24 -------------------------------------------------------------------------------- Physician Orders Details Patient Name: Date of Service: Travis [name], [place] C. 01/26/2021 8:45 A M Medical Record Number: [id number] Patient Account Number: [id number] Date of Birth/Sex: Treating RN: 16-Aug-1961 (60 y.o. 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 - Tuesday 3/1 Return A Bathing/ Shower/ Hygiene May shower and wash wound with soap and water. - with dressing changes. Negative Presssure Wound Therapy Wound Vac to wound continuously at 149mm/hg pressure - line wound bed with collagen under black foam to be changed 3 times per week by home health Black Foam Off-Loading Turn and reposition every 2 hours Home Health No change in wound care orders this week; continue Home Health for wound care. May utilize formulary equivalent dressing for wound treatment orders unless otherwise specified. - Home health to change VAC 3 times per week including on days seen in the wound clinic Other Home Health Orders/Instructions: - [facility] Wound Treatment Wound #1 - Sacrum Cleanser: Soap and Water 3 x Per Week/30 Days Discharge Instructions: May shower and wash wound with dial antibacterial soap and water prior to dressing change. Cleanser: Wound Cleanser (Home Health) 3 x Per Week/30 Days Discharge Instructions: Cleanse the wound with wound cleanser prior to applying a clean dressing using gauze sponges, not tissue or cotton balls. Peri-Wound Care: Skin Prep (Home Health) 3 x Per Week/30 Days Discharge Instructions: Use skin prep as directed Prim Dressing: FIBRACOL Plus Dressing, 2x2 in (collagen) (Home Health) 3 x Per Week/30 Days ary Discharge Instructions: Moisten collagen with saline or hydrogel. Be sure to pack collagen into undermining.place black foam over  collagen. Prim Dressing: Apply wet-to-dry dressing today in clinic 3 x Per Week/30 Days ary Discharge Instructions: home health to apply vac Electronic Signature(s) Signed: 01/26/2021 4:25:56 PM By: Levan Hurst RN, BSN Signed: 01/26/2021 5:25:02 PM By: [name] MD Entered By: Levan Hurst on 01/26/2021 10:06:13 -------------------------------------------------------------------------------- Problem List Details Patient Name: Date of Service: Travis [name], [place] EL C. 01/26/2021 8:45 A M Medical Record Number:  [id number] Patient Account Number: [id number] Date of Birth/Sex: Treating RN: [birth year] (60 y.o. 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 01/26/2021 8:45 A M Medical Record Number: [id number] Patient Account Number: [id number] Date of Birth/Sex: Treating RN: October 23, 1961 (60 y.o. Travis [name] Primary Care Provider: Dorthy Cooler, [place] Other Clinician: Referring Provider: Treating Provider/Extender: Marlou Sa in Treatment: 4 Subjective History of Present Illness (HPI) ADMISSION 12/26/2020 This is a 60 year old man who has chronic  renal failure on dialysis presuming secondary to nephrosclerosis. He was admitted to hospital for 47 days from 10/28/2020 through 12/14/2020. Starting with some combination of an anterior ST elevation MI status post stent to the LAD septic shock secondary to MSSA. The source of the sepsis was never apparently determined. Had paroxysmal atrial fibrillation, and upper GI bleed, PEA cardiac arrests and then bilateral empyemas requiring bilateral chest tubes. His wife became aware during this exceedingly complex hospitalization of a pressure ulcer on his sacrum around December 4. They tell me that the mattress which was some form of air pressurized mattress deflated he was lying on a hard surface. He apparently had debridements of this while he was in hospital. He was discharged from hospital with wet-to-dry dressings that they are still doing and changing daily. He seems to have made some improvement. I cannot see any relevant imaging or cultures the patient makes rigorous attempts to offload this both at home and at dialysis. Is attempting to eat well with uses Juven for supplements. I note that prior to leaving the hospital understandably his albumin level was 2. 3. He arrives in clinic today with a wound measuring 6.8 x 5 x 2 cm. He has undermining from 11-5 o'clock at 2.6 cm. There is no evidence of surrounding soft tissue infection no crepitus. No palpable bone no purulent drainage. The granulation is slightly cobblestoned 2/4; patient has been using the wound VAC that we ordered last time. He has home health changing this. I do not think they applied the collagen under the VAC foam that we suggested last time. The undermining area from 11-5 o'clock is filled in otherwise the surface of this looks about the same. There is a small tail to this that goes into the left buttock 2/18; collagen under the VAC foam. His undermining has completely gone away. Wound volume is considerably  better. Objective Constitutional Sitting or standing Blood Pressure is within target range for patient.. Pulse regular and within target range for patient.Marland Kitchen Respirations regular, non-labored and within target range.. Temperature is normal and within the target range for the patient.Marland Kitchen Appears in no distress. Vitals Time Taken: 9:07 AM, Height: 71 in, Weight: 160 lbs, BMI: 22.3, Temperature: 98.4 F, Pulse: 77 bpm, Respiratory Rate: 17 breaths/min, Blood Pressure: 107/64 mmHg. General Notes: Wound exam; the wound is come in remarkably well. Very fibrinous debris and hyper granulated granulation I am not back with a open curette.  Hemostasis with direct pressure this looks really remarkably good no evidence of surrounding infection. There is no undermining area no probing tunnels no depth. Integumentary (Hair, Skin) Wound #1 status is Open. Original cause of wound was Pressure Injury. The wound is located on the Sacrum. The wound measures 3.3cm length x 1.2cm width x 0.9cm depth; 3.11cm^2 area and 2.799cm^3 volume. There is Fat Layer (Subcutaneous Tissue) exposed. There is no tunneling or undermining noted. There is a large amount of serosanguineous drainage noted. The wound margin is well defined and not attached to the wound base. There is medium (34-66%) red, pink granulation within the wound bed. There is a medium (34-66%) amount of necrotic tissue within the wound bed including Adherent Slough. 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 There was a Excisional Skin/Subcutaneous Tissue Debridement with a total area of 3.96 sq cm performed by Ricard Dillon., MD. With the following  instrument(s): Curette to remove Viable and Non-Viable tissue/material. Material removed includes Subcutaneous Tissue and Slough and. No specimens were taken. A time out was conducted at 10:00, prior to the start of the procedure. A Minimum amount of bleeding was controlled with Pressure. The procedure was tolerated well with a pain level of 0 throughout and a pain level of 0 following the procedure. Post Debridement Measurements: 3.3cm length x 1.2cm width x 0.9cm depth; 2.799cm^3 volume. Post debridement Stage noted as Category/Stage IV. Character of Wound/Ulcer Post Debridement is improved. Post procedure Diagnosis Wound #1: Same as Pre-Procedure Plan Follow-up Appointments: Return Appointment in 2 weeks. - Tuesday 3/1 Bathing/ Shower/ Hygiene: May shower and wash wound with soap and water. - with dressing changes. Negative Presssure Wound Therapy: Wound Vac to wound continuously at 175mm/hg pressure - line wound bed with collagen under black foam to be changed 3 times per week by home health Black Foam Off-Loading: Turn and reposition every 2 hours Home Health: No change in wound care orders this week; continue Home Health for wound care. May utilize formulary equivalent dressing for wound treatment orders unless otherwise specified. - Home health to change VAC 3 times per week including on days seen in the wound clinic Other Home Health Orders/Instructions: - [facility] WOUND #1: - Sacrum Wound Laterality: Cleanser: Soap and Water 3 x Per Week/30 Days Discharge Instructions: May shower and wash wound with dial antibacterial soap and water prior to dressing change. Cleanser: Wound Cleanser (Home Health) 3 x Per Week/30 Days Discharge Instructions: Cleanse the wound with wound cleanser prior to applying a clean dressing using gauze sponges, not tissue or cotton balls. Peri-Wound Care: Skin Prep (Home Health) 3 x Per Week/30 Days Discharge Instructions: Use skin prep as directed Prim  Dressing: FIBRACOL Plus Dressing, 2x2 in (collagen) (Home Health) 3 x Per Week/30 Days ary Discharge Instructions: Moisten collagen with saline or hydrogel. Be sure to pack collagen into undermining.place black foam over collagen. Prim Dressing: Apply wet-to-dry dressing today in clinic 3 x Per Week/30 Days ary Discharge Instructions: home health to apply vac 1. We continued with the same wound VAC for another 2 weeks or so. At that point it may be possible to discontinue the wound VAC and proceed to topical dressings perhaps collagen or Hydrofera Blue 2. There is no evidence  of infection here. Electronic Signature(s) Signed: 01/26/2021 5:25:02 PM By: [name] MD Entered By: [name] on 01/26/2021 10:10:01 -------------------------------------------------------------------------------- SuperBill Details Patient Name: Date of Service: Travis [name], [place]. 01/26/2021 Medical Record Number: [id number] Patient Account Number: [id number] Date of Birth/Sex: Treating RN: [birth year] (60 y.o. 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

## 2021-01-26 NOTE — Progress Notes (Signed)
WAYMOND, MEADOR ([id number]) Visit Report for 01/26/2021 Arrival Information Details Patient Name: Date of Service: Travis [name] 01/26/2021 8:45 A M Medical Record Number: [id number] Patient Account Number: [id number] Date of Birth/Sex: Treating RN: 12/29/1960 (60 y.o. Burnadette Pop, Lauren Primary Care : Dorthy Cooler, Dibas Other Clinician: Referring : Treating /Extender: Marlou Sa in Treatment: 4 Visit Information History Since Last Visit Added or deleted any medications: No Patient Arrived: Ambulatory Any new allergies or adverse reactions: No Arrival Time: 09:06 Had a fall or experienced change in No Accompanied By: wife activities of daily living that may affect Transfer Assistance: None risk of falls: Patient Identification Verified: Yes Signs or symptoms of abuse/neglect since last visito No Secondary Verification Process Completed: Yes Hospitalized since last visit: No Patient Requires Transmission-Based Precautions: No Implantable device outside of the clinic excluding No Patient Has Alerts: No cellular tissue based products placed in the center since last visit: Has Dressing in Place as Prescribed: Yes Pain Present Now: Yes Electronic Signature(s) Signed: 01/26/2021 5:09:47 PM By: Rhae Hammock RN Entered By: Rhae Hammock on 01/26/2021 09:06:33 -------------------------------------------------------------------------------- Lower Extremity Assessment Details Patient Name: Date of Service: Worthy Keeler EL C. 01/26/2021 8:45 A M Medical Record Number: [id number] Patient Account Number: [id number] Date of Birth/Sex: Treating RN: Jun 22, 1961 (60 y.o. Erie Noe Primary Care : Dorthy Cooler, Dibas Other Clinician: Referring : Treating /Extender: Marlou Sa in Treatment: 4 Electronic Signature(s) Signed: 01/26/2021 5:09:47 PM By: Rhae Hammock  RN Entered By: Rhae Hammock on 01/26/2021 09:11:09 -------------------------------------------------------------------------------- Multi Wound Chart Details Patient Name: Date of Service: Cruzita Lederer, Dean EL C. 01/26/2021 8:45 A M Medical Record Number: [id number] Patient Account Number: [id number] Date of Birth/Sex: Treating RN: 1961-03-25 (60 y.o. Ernestene Mention Primary Care : Dorthy Cooler, Dibas Other Clinician: Referring : Treating /Extender: Marlou Sa in Treatment: 4 Vital Signs Height(in): 71 Pulse(bpm): 77 Weight(lbs): 160 Blood Pressure(mmHg): 107/64 Body Mass Index(BMI): 22 Temperature(F): 98.4 Respiratory Rate(breaths/min): 17 Photos: [1:No Photos Sacrum] [N/A:N/A N/A] Wound Location: [1:Pressure Injury] [N/A:N/A] Wounding Event: [1:Pressure Ulcer] [N/A:N/A] Primary Etiology: [1:Sleep Apnea, Coronary Artery] [N/A:N/A] Comorbid History: [1:Disease, Hypertension, Myocardial Infarction, End Stage Renal Disease, Gout 11/08/2020] [N/A:N/A] Date Acquired: [1:4] [N/A:N/A] Weeks of Treatment: [1:Open] [N/A:N/A] Wound Status: [1:3.3x1.2x0.9] [N/A:N/A] Measurements L x W x D (cm) [1:3.11] [N/A:N/A] A (cm) : rea [1:2.799] [N/A:N/A] Volume (cm) : [1:89.80%] [N/A:N/A] % Reduction in A [1:rea: 95.40%] [N/A:N/A] % Reduction in Volume: [1:Category/Stage IV] [N/A:N/A] Classification: [1:Large] [N/A:N/A] Exudate A mount: [1:Serosanguineous] [N/A:N/A] Exudate Type: [1:red, brown] [N/A:N/A] Exudate Color: [1:Well defined, not attached] [N/A:N/A] Wound Margin: [1:Medium (34-66%)] [N/A:N/A] Granulation A mount: [1:Red, Pink] [N/A:N/A] Granulation Quality: [1:Medium (34-66%)] [N/A:N/A] Necrotic A mount: [1:Fat Layer (Subcutaneous Tissue): Yes N/A] Exposed Structures: [1:Fascia: No Tendon: No Muscle: No Joint: No Bone: No Medium (34-66%)] [N/A:N/A] Epithelialization: [1:Debridement - Excisional]  [N/A:N/A] Debridement: Pre-procedure Verification/Time Out 10:00 [N/A:N/A] Taken: [1:Subcutaneous, Slough] [N/A:N/A] Tissue Debrided: [1:Skin/Subcutaneous Tissue] [N/A:N/A] Level: [1:3.96] [N/A:N/A] Debridement A (sq cm): [1:rea Curette] [N/A:N/A] Instrument: [1:Minimum] [N/A:N/A] Bleeding: [1:Pressure] [N/A:N/A] Hemostasis A chieved: [1:0] [N/A:N/A] Procedural Pain: [1:0] [N/A:N/A] Post Procedural Pain: [1:Procedure was tolerated well] [N/A:N/A] Debridement Treatment Response: [1:3.3x1.2x0.9] [N/A:N/A] Post Debridement Measurements L x W x D (cm) [1:2.799] [N/A:N/A] Post Debridement Volume: (cm) [1:Category/Stage IV] [N/A:N/A] Post Debridement Stage: [1:Debridement] [N/A:N/A] Treatment Notes Electronic Signature(s) Signed: 01/26/2021 5:25:02 PM By: [name] MD Signed: 01/26/2021 5:33:43 PM By: Baruch Gouty RN, BSN Entered By: [name] on 01/26/2021 10:06:46 -------------------------------------------------------------------------------- [place]  Details Patient Name: Date of Service: Travis [name] 01/26/2021 8:45 A M Medical Record Number: [id number] Patient Account Number: [id number] Date of Birth/Sex: Treating RN: 04-08-1961 (60 y.o. Janyth Contes Primary Care Akiel Fennell: Other Clinician: Dorthy Cooler, Dibas Referring Nilah Belcourt: Treating Linas Stepter/Extender: Marlou Sa in Treatment: 4 Active Inactive Wound/Skin Impairment Nursing Diagnoses: Impaired tissue integrity Knowledge deficit related to ulceration/compromised skin integrity Goals: Patient will have a decrease in wound volume by X% from date: (specify in notes) Date Initiated: 12/26/2020 Date Inactivated: 01/12/2021 Target Resolution Date: 01/12/2021 Goal Status: Met Patient/caregiver will verbalize understanding of skin care regimen Date Initiated: 12/26/2020 Target Resolution Date: 02/09/2021 Goal Status: Active Ulcer/skin breakdown will have a  volume reduction of 30% by week 4 Date Initiated: 12/26/2020 Date Inactivated: 01/12/2021 Target Resolution Date: 01/12/2021 Goal Status: Met Ulcer/skin breakdown will have a volume reduction of 50% by week 8 Date Initiated: 01/12/2021 Target Resolution Date: 02/09/2021 Goal Status: Active Interventions: Assess patient/caregiver ability to obtain necessary supplies Assess patient/caregiver ability to perform ulcer/skin care regimen upon admission and as needed Assess ulceration(s) every visit Notes: Electronic Signature(s) Signed: 01/26/2021 4:25:56 PM By: Levan Hurst RN, BSN Entered By: Levan Hurst on 01/26/2021 09:11:42 -------------------------------------------------------------------------------- Pain Assessment Details Patient Name: Date of Service: Worthy Keeler EL C. 01/26/2021 8:45 A M Medical Record Number: [id number] Patient Account Number: [id number] Date of Birth/Sex: Treating RN: 1961/10/18 (60 y.o. Erie Noe Primary Care Tanise Russman: Dorthy Cooler, Dibas Other Clinician: Referring Ralf Konop[place]: Treating Alaina Donati/Extender: Marlou Sa in Treatment: 4 Active Problems Location of Pain Severity and Description of Pain Patient Has Paino Yes Site Locations Pain Location: Pain Location: Pain in Ulcers With Dressing Change: Yes Duration of the Pain. Constant / Intermittento Intermittent Rate the pain. Current Pain Level: 2 Worst Pain Level: 10 Least Pain Level: 0 Tolerable Pain Level: 2 Character of Pain Describe the Pain: Aching Pain Management and Medication Current Pain Management: Medication: Yes Cold Application: No Rest: Yes Massage: No Activity: No T.E.N.S.: No Heat Application: No Leg drop or elevation: No Is the Current Pain Management Adequate: Adequate How does your wound impact your activities of daily livingo Sleep: No Bathing: No Appetite: No Relationship With Others: No Bladder Continence: No Emotions: No Bowel  Continence: No Work: No Toileting: No Drive: No Dressing: No Hobbies: No Electronic Signature(s) Signed: 01/26/2021 5:09:47 PM By: Rhae Hammock RN Entered By: Rhae Hammock on 01/26/2021 09:11:03 -------------------------------------------------------------------------------- Patient/Caregiver Education Details Patient Name: Date of Service: Travis [name] 2/18/2022andnbsp8:45 A M Medical Record Number: [id number] Patient Account Number: [id number] Date of Birth/Gender: Treating RN: March 10, 1961 (60 y.o. Janyth Contes Primary Care Physician: Dorthy Cooler, Dibas Other Clinician: Referring Physician: Treating Physician/Extender: Marlou Sa in Treatment: 4 Education Assessment Education Provided To: Patient Education Topics Provided Wound/Skin Impairment: Methods: Explain/Verbal Responses: State content correctly Electronic Signature(s) Signed: 01/26/2021 4:25:56 PM By: Levan Hurst RN, BSN Entered By: Levan Hurst on 01/26/2021 09:11:52 -------------------------------------------------------------------------------- Wound Assessment Details Patient Name: Date of Service: Worthy Keeler EL C. 01/26/2021 8:45 A M Medical Record Number: [id number] Patient Account Number: [id number] Date of Birth/Sex: Treating RN: August 23, 1961 (60 y.o. Erie Noe Primary Care Kalliopi Coupland: Dorthy Cooler, Dibas Other Clinician: Referring Boniface Goffe: Treating Sanja Elizardo/Extender: Marlou Sa in Treatment: 4 Wound Status Wound Number: 1 Primary Pressure Ulcer Etiology: Wound Location: Sacrum Wound Open Wounding Event: Pressure Injury Status: Date Acquired: 11/08/2020 Comorbid Sleep Apnea, Coronary Artery Disease, Hypertension, Myocardial Weeks Of Treatment: 4 History: Infarction, End Stage  Renal Disease, Gout Clustered Wound: No Wound Measurements Length: (cm) 3.3 Width: (cm) 1.2 Depth: (cm) 0.9 Area: (cm) 3.11 Volume:  (cm) 2.799 % Reduction in Area: 89.8% % Reduction in Volume: 95.4% Epithelialization: Medium (34-66%) Tunneling: No Undermining: No Wound Description Classification: Category/Stage IV Wound Margin: Well defined, not attached Exudate Amount: Large Exudate Type: Serosanguineous Exudate Color: red, brown Foul Odor After Cleansing: No Slough/Fibrino Yes Wound Bed Granulation Amount: Medium (34-66%) Exposed Structure Granulation Quality: Red, Pink Fascia Exposed: No Necrotic Amount: Medium (34-66%) Fat Layer (Subcutaneous Tissue) Exposed: Yes Necrotic Quality: Adherent Slough Tendon Exposed: No Muscle Exposed: No Joint Exposed: No Bone Exposed: No Electronic Signature(s) Signed: 01/26/2021 5:09:47 PM By: Rhae Hammock RN Entered By: Rhae Hammock on 01/26/2021 09:22:19 -------------------------------------------------------------------------------- Vitals Details Patient Name: Date of Service: Cruzita Lederer, MICHA EL C. 01/26/2021 8:45 A M Medical Record Number: [id number] Patient Account Number: [id number] Date of Birth/Sex: Treating RN: 1961-12-09 (60 y.o. Burnadette Pop, Lauren Primary Care Hazley Dezeeuw: Dorthy Cooler, Dibas Other Clinician: Referring Anthea Udovich: Treating Nazyia Gaugh/Extender: Marlou Sa in Treatment: 4 Vital Signs Time Taken: 09:07 Temperature (F): 98.4 Height (in): 71 Pulse (bpm): 77 Weight (lbs): 160 Respiratory Rate (breaths/min): 17 Body Mass Index (BMI): 22.3 Blood Pressure (mmHg): 107/64 Reference Range: 80 - 120 mg / dl Electronic Signature(s) Signed: 01/26/2021 5:09:47 PM By: Rhae Hammock RN Entered By: Rhae Hammock on 01/26/2021 09:07:33

## 2021-01-30 ENCOUNTER — Observation Stay (HOSPITAL_COMMUNITY): Payer: Medicare Other

## 2021-01-30 ENCOUNTER — Encounter (HOSPITAL_COMMUNITY): Payer: Self-pay | Admitting: Emergency Medicine

## 2021-01-30 ENCOUNTER — Emergency Department (HOSPITAL_COMMUNITY): Payer: Medicare Other

## 2021-01-30 ENCOUNTER — Telehealth: Payer: Self-pay | Admitting: Cardiology

## 2021-01-30 ENCOUNTER — Other Ambulatory Visit: Payer: Self-pay

## 2021-01-30 DIAGNOSIS — Z955 Presence of coronary angioplasty implant and graft: Secondary | ICD-10-CM

## 2021-01-30 DIAGNOSIS — Z452 Encounter for adjustment and management of vascular access device: Secondary | ICD-10-CM

## 2021-01-30 DIAGNOSIS — N186 End stage renal disease: Secondary | ICD-10-CM | POA: Diagnosis not present

## 2021-01-30 DIAGNOSIS — I5043 Acute on chronic combined systolic (congestive) and diastolic (congestive) heart failure: Secondary | ICD-10-CM | POA: Diagnosis not present

## 2021-01-30 DIAGNOSIS — J9811 Atelectasis: Secondary | ICD-10-CM | POA: Diagnosis present

## 2021-01-30 DIAGNOSIS — I469 Cardiac arrest, cause unspecified: Secondary | ICD-10-CM

## 2021-01-30 DIAGNOSIS — E875 Hyperkalemia: Secondary | ICD-10-CM | POA: Diagnosis not present

## 2021-01-30 DIAGNOSIS — Z905 Acquired absence of kidney: Secondary | ICD-10-CM

## 2021-01-30 DIAGNOSIS — I953 Hypotension of hemodialysis: Secondary | ICD-10-CM | POA: Diagnosis present

## 2021-01-30 DIAGNOSIS — N2581 Secondary hyperparathyroidism of renal origin: Secondary | ICD-10-CM | POA: Diagnosis present

## 2021-01-30 DIAGNOSIS — J9601 Acute respiratory failure with hypoxia: Secondary | ICD-10-CM | POA: Diagnosis not present

## 2021-01-30 DIAGNOSIS — Z992 Dependence on renal dialysis: Secondary | ICD-10-CM

## 2021-01-30 DIAGNOSIS — Z8249 Family history of ischemic heart disease and other diseases of the circulatory system: Secondary | ICD-10-CM

## 2021-01-30 DIAGNOSIS — E785 Hyperlipidemia, unspecified: Secondary | ICD-10-CM | POA: Diagnosis present

## 2021-01-30 DIAGNOSIS — I462 Cardiac arrest due to underlying cardiac condition: Secondary | ICD-10-CM | POA: Diagnosis not present

## 2021-01-30 DIAGNOSIS — Z888 Allergy status to other drugs, medicaments and biological substances status: Secondary | ICD-10-CM

## 2021-01-30 DIAGNOSIS — I444 Left anterior fascicular block: Secondary | ICD-10-CM | POA: Diagnosis present

## 2021-01-30 DIAGNOSIS — G4730 Sleep apnea, unspecified: Secondary | ICD-10-CM | POA: Diagnosis present

## 2021-01-30 DIAGNOSIS — Z79899 Other long term (current) drug therapy: Secondary | ICD-10-CM

## 2021-01-30 DIAGNOSIS — I255 Ischemic cardiomyopathy: Secondary | ICD-10-CM | POA: Diagnosis present

## 2021-01-30 DIAGNOSIS — D631 Anemia in chronic kidney disease: Secondary | ICD-10-CM | POA: Diagnosis present

## 2021-01-30 DIAGNOSIS — R570 Cardiogenic shock: Secondary | ICD-10-CM | POA: Diagnosis not present

## 2021-01-30 DIAGNOSIS — I10 Essential (primary) hypertension: Secondary | ICD-10-CM | POA: Diagnosis present

## 2021-01-30 DIAGNOSIS — I77810 Thoracic aortic ectasia: Secondary | ICD-10-CM | POA: Diagnosis present

## 2021-01-30 DIAGNOSIS — L89154 Pressure ulcer of sacral region, stage 4: Secondary | ICD-10-CM | POA: Diagnosis present

## 2021-01-30 DIAGNOSIS — I252 Old myocardial infarction: Secondary | ICD-10-CM

## 2021-01-30 DIAGNOSIS — M549 Dorsalgia, unspecified: Secondary | ICD-10-CM | POA: Diagnosis present

## 2021-01-30 DIAGNOSIS — I2584 Coronary atherosclerosis due to calcified coronary lesion: Secondary | ICD-10-CM | POA: Diagnosis present

## 2021-01-30 DIAGNOSIS — Z8674 Personal history of sudden cardiac arrest: Secondary | ICD-10-CM

## 2021-01-30 DIAGNOSIS — I2510 Atherosclerotic heart disease of native coronary artery without angina pectoris: Secondary | ICD-10-CM | POA: Diagnosis present

## 2021-01-30 DIAGNOSIS — I480 Paroxysmal atrial fibrillation: Secondary | ICD-10-CM | POA: Diagnosis present

## 2021-01-30 DIAGNOSIS — I214 Non-ST elevation (NSTEMI) myocardial infarction: Secondary | ICD-10-CM | POA: Diagnosis not present

## 2021-01-30 DIAGNOSIS — Z87891 Personal history of nicotine dependence: Secondary | ICD-10-CM

## 2021-01-30 DIAGNOSIS — I5042 Chronic combined systolic (congestive) and diastolic (congestive) heart failure: Secondary | ICD-10-CM | POA: Diagnosis not present

## 2021-01-30 DIAGNOSIS — U071 COVID-19: Secondary | ICD-10-CM | POA: Diagnosis present

## 2021-01-30 DIAGNOSIS — N179 Acute kidney failure, unspecified: Secondary | ICD-10-CM | POA: Diagnosis not present

## 2021-01-30 DIAGNOSIS — I472 Ventricular tachycardia: Secondary | ICD-10-CM | POA: Diagnosis not present

## 2021-01-30 DIAGNOSIS — I083 Combined rheumatic disorders of mitral, aortic and tricuspid valves: Secondary | ICD-10-CM | POA: Diagnosis present

## 2021-01-30 DIAGNOSIS — I132 Hypertensive heart and chronic kidney disease with heart failure and with stage 5 chronic kidney disease, or end stage renal disease: Secondary | ICD-10-CM | POA: Diagnosis present

## 2021-01-30 DIAGNOSIS — Z886 Allergy status to analgesic agent status: Secondary | ICD-10-CM

## 2021-01-30 DIAGNOSIS — K2100 Gastro-esophageal reflux disease with esophagitis, without bleeding: Secondary | ICD-10-CM | POA: Diagnosis present

## 2021-01-30 DIAGNOSIS — Z85528 Personal history of other malignant neoplasm of kidney: Secondary | ICD-10-CM

## 2021-01-30 DIAGNOSIS — R7989 Other specified abnormal findings of blood chemistry: Secondary | ICD-10-CM | POA: Diagnosis present

## 2021-01-30 LAB — BASIC METABOLIC PANEL
Anion gap: 14 (ref 5–15)
BUN: 51 mg/dL — ABNORMAL HIGH (ref 6–20)
CO2: 30 mmol/L (ref 22–32)
Calcium: 8.4 mg/dL — ABNORMAL LOW (ref 8.9–10.3)
Chloride: 95 mmol/L — ABNORMAL LOW (ref 98–111)
Creatinine, Ser: 6.39 mg/dL — ABNORMAL HIGH (ref 0.61–1.24)
GFR, Estimated: 9 mL/min — ABNORMAL LOW (ref >60)
Glucose, Bld: 104 mg/dL — ABNORMAL HIGH (ref 70–99)
Potassium: 3.7 mmol/L (ref 3.5–5.1)
Sodium: 139 mmol/L (ref 135–145)

## 2021-01-30 LAB — CBC
HCT: 35.6 % — ABNORMAL LOW (ref 39.0–52.0)
Hemoglobin: 10.6 g/dL — ABNORMAL LOW (ref 13.0–17.0)
MCH: 27.5 pg (ref 26.0–34.0)
MCHC: 29.8 g/dL — ABNORMAL LOW (ref 30.0–36.0)
MCV: 92.5 fL (ref 80.0–100.0)
Platelets: 198 10*3/uL (ref 150–400)
RBC: 3.85 MIL/uL — ABNORMAL LOW (ref 4.22–5.81)
RDW: 17.9 % — ABNORMAL HIGH (ref 11.5–15.5)
WBC: 9.6 10*3/uL (ref 4.0–10.5)
nRBC: 0 % (ref 0.0–0.2)

## 2021-01-30 LAB — TROPONIN I (HIGH SENSITIVITY)
Troponin I (High Sensitivity): 1940 ng/L (ref <18)
Troponin I (High Sensitivity): 2324 ng/L (ref <18)
Troponin I (High Sensitivity): 4219 ng/L (ref <18)

## 2021-01-30 LAB — D-DIMER, QUANTITATIVE: D-Dimer, Quant: 2.16 ug/mL-FEU — ABNORMAL HIGH (ref 0.00–0.50)

## 2021-01-30 LAB — HIV ANTIBODY (ROUTINE TESTING W REFLEX): HIV Screen 4th Generation wRfx: NONREACTIVE

## 2021-01-30 LAB — PROCALCITONIN: Procalcitonin: 0.9 ng/mL

## 2021-01-30 LAB — C-REACTIVE PROTEIN: CRP: 0.7 mg/dL (ref <1.0)

## 2021-01-30 LAB — RESP PANEL BY RT-PCR (FLU A&B, COVID) ARPGX2
Influenza A by PCR: NEGATIVE
Influenza B by PCR: NEGATIVE
SARS Coronavirus 2 by RT PCR: POSITIVE — AB

## 2021-01-30 LAB — LACTATE DEHYDROGENASE: LDH: 305 U/L — ABNORMAL HIGH (ref 98–192)

## 2021-01-30 LAB — HEPARIN LEVEL (UNFRACTIONATED): Heparin Unfractionated: 0.72 IU/mL — ABNORMAL HIGH (ref 0.30–0.70)

## 2021-01-30 MED ORDER — ONDANSETRON HCL 4 MG/2ML IJ SOLN: 4.0000 mg | Freq: Four times a day (QID) | INTRAMUSCULAR | Status: DC | PRN

## 2021-01-30 MED ORDER — SODIUM CHLORIDE 0.9% FLUSH: 3.0000 mL | INTRAVENOUS | Status: DC | PRN

## 2021-01-30 MED ORDER — PANTOPRAZOLE SODIUM 40 MG IV SOLR
40.0000 mg | Freq: Two times a day (BID) | INTRAVENOUS | Status: DC
  Administered 2021-01-30 – 2021-01-31 (×4): 40 mg via INTRAVENOUS
  Filled 2021-01-30 (×4): qty 40

## 2021-01-30 MED ORDER — ALBUTEROL SULFATE HFA 108 (90 BASE) MCG/ACT IN AERS
2.0000 | INHALATION_SPRAY | Freq: Four times a day (QID) | RESPIRATORY_TRACT | Status: DC
  Administered 2021-01-30 – 2021-02-01 (×6): 2 via RESPIRATORY_TRACT
  Filled 2021-01-30: qty 6.7

## 2021-01-30 MED ORDER — IOHEXOL 350 MG/ML SOLN
100.0000 mL | Freq: Once | INTRAVENOUS | Status: AC | PRN
  Administered 2021-01-30: 100 mL via INTRAVENOUS

## 2021-01-30 MED ORDER — SODIUM CHLORIDE 0.9 % IV SOLN: INTRAVENOUS | Status: DC

## 2021-01-30 MED ORDER — HEPARIN BOLUS VIA INFUSION
4000.0000 [IU] | Freq: Once | INTRAVENOUS | Status: AC
  Administered 2021-01-30: 4000 [IU] via INTRAVENOUS
  Filled 2021-01-30: qty 4000

## 2021-01-30 MED ORDER — LACTATED RINGERS IV BOLUS
250.0000 mL | Freq: Once | INTRAVENOUS | Status: AC
  Administered 2021-01-30: 250 mL via INTRAVENOUS

## 2021-01-30 MED ORDER — FENTANYL CITRATE (PF) 100 MCG/2ML IJ SOLN: 25.0000 ug | INTRAMUSCULAR | Status: DC | PRN

## 2021-01-30 MED ORDER — ASCORBIC ACID 500 MG PO TABS
500.0000 mg | ORAL_TABLET | Freq: Every day | ORAL | Status: DC
  Administered 2021-01-30 – 2021-01-31 (×2): 500 mg via ORAL
  Filled 2021-01-30 (×2): qty 1

## 2021-01-30 MED ORDER — MORPHINE SULFATE (PF) 2 MG/ML IV SOLN: 2.0000 mg | INTRAVENOUS | Status: DC | PRN

## 2021-01-30 MED ORDER — ZINC SULFATE 220 (50 ZN) MG PO CAPS
220.0000 mg | ORAL_CAPSULE | Freq: Every day | ORAL | Status: DC
  Administered 2021-01-31: 220 mg via ORAL
  Filled 2021-01-30 (×2): qty 1

## 2021-01-30 MED ORDER — MELATONIN 3 MG PO TABS
3.0000 mg | ORAL_TABLET | Freq: Every day | ORAL | Status: DC
  Administered 2021-01-31 (×2): 3 mg via ORAL
  Filled 2021-01-30 (×2): qty 1

## 2021-01-30 MED ORDER — MORPHINE SULFATE (PF) 2 MG/ML IV SOLN
2.0000 mg | Freq: Once | INTRAVENOUS | Status: AC
  Administered 2021-01-30: 2 mg via INTRAVENOUS
  Filled 2021-01-30: qty 1

## 2021-01-30 MED ORDER — FENTANYL CITRATE (PF) 100 MCG/2ML IJ SOLN: 12.5000 ug | INTRAMUSCULAR | Status: DC | PRN

## 2021-01-30 MED ORDER — CINACALCET HCL 30 MG PO TABS
120.0000 mg | ORAL_TABLET | Freq: Every day | ORAL | Status: DC
  Administered 2021-01-31: 120 mg via ORAL
  Filled 2021-01-30 (×2): qty 4

## 2021-01-30 MED ORDER — MIDODRINE HCL 5 MG PO TABS
5.0000 mg | ORAL_TABLET | ORAL | Status: DC
  Administered 2021-01-31 (×3): 5 mg via ORAL
  Filled 2021-01-30 (×3): qty 1

## 2021-01-30 MED ORDER — MELATONIN 3 MG PO TABS: 3.0000 mg | ORAL_TABLET | Freq: Every day | ORAL | Status: DC

## 2021-01-30 MED ORDER — GUAIFENESIN-DM 100-10 MG/5ML PO SYRP: 10.0000 mL | ORAL_SOLUTION | ORAL | Status: DC | PRN

## 2021-01-30 MED ORDER — ATORVASTATIN CALCIUM 80 MG PO TABS
80.0000 mg | ORAL_TABLET | Freq: Every day | ORAL | Status: DC
  Administered 2021-01-30 – 2021-01-31 (×2): 80 mg via ORAL
  Filled 2021-01-30 (×2): qty 1

## 2021-01-30 MED ORDER — AMIODARONE HCL 200 MG PO TABS
200.0000 mg | ORAL_TABLET | Freq: Every day | ORAL | Status: DC
  Filled 2021-01-30: qty 1

## 2021-01-30 MED ORDER — SODIUM CHLORIDE 0.9 % IV SOLN: 250.0000 mL | INTRAVENOUS | Status: DC | PRN

## 2021-01-30 MED ORDER — MIDODRINE HCL 5 MG PO TABS
5.0000 mg | ORAL_TABLET | ORAL | Status: DC
  Administered 2021-01-30: 17:00:00 5 mg via ORAL
  Filled 2021-01-30: qty 1

## 2021-01-30 MED ORDER — MORPHINE SULFATE (PF) 2 MG/ML IV SOLN: 1.0000 mg | INTRAVENOUS | Status: DC | PRN

## 2021-01-30 MED ORDER — METOPROLOL TARTRATE 25 MG PO TABS
25.0000 mg | ORAL_TABLET | Freq: Two times a day (BID) | ORAL | Status: DC
  Administered 2021-01-31: 25 mg via ORAL
  Filled 2021-01-30 (×2): qty 1

## 2021-01-30 MED ORDER — SODIUM CHLORIDE 0.9% FLUSH
3.0000 mL | Freq: Two times a day (BID) | INTRAVENOUS | Status: DC
  Administered 2021-01-30 (×2): 3 mL via INTRAVENOUS

## 2021-01-30 MED ORDER — MIDODRINE HCL 5 MG PO TABS: 10.0000 mg | ORAL_TABLET | ORAL | Status: DC

## 2021-01-30 MED ORDER — HEPARIN (PORCINE) 25000 UT/250ML-% IV SOLN
950.0000 [IU]/h | INTRAVENOUS | Status: DC
  Administered 2021-01-30: 15:00:00 950 [IU]/h via INTRAVENOUS
  Filled 2021-01-30: qty 250

## 2021-01-30 MED ORDER — ACETAMINOPHEN 325 MG PO TABS: 650.0000 mg | ORAL_TABLET | ORAL | Status: DC | PRN

## 2021-01-30 MED ORDER — TICAGRELOR 90 MG PO TABS
90.0000 mg | ORAL_TABLET | Freq: Two times a day (BID) | ORAL | Status: DC
  Administered 2021-01-30 – 2021-01-31 (×2): 90 mg via ORAL
  Filled 2021-01-30 (×2): qty 1

## 2021-01-30 NOTE — Progress Notes (Signed)
Travis [name] for heparin Indication: ACS/STEMI  Labs: Recent Labs    02/04/2021 1027 02/02/2021 1247 01/23/2021 1920 01/17/2021 2237  HGB 10.6*  --   --   --   HCT 35.6*  --   --   --   PLT 198  --   --   --   HEPARINUNFRC  --   --   --  0.72*  CREATININE 6.39*  --   --   --   TROPONINIHS 1,940* 2,324* 4,219*  --     Assessment: 60 yo admitted for chest pain x 2 days that radiates into upper back.  Hx of CAD w/ MI last November with DES placed.  On Brilinta PTA.  Cardiology consulted to rule out NSTEMI/ACS given PMH.  Troponins 1940, CBC stable.  Pt is COVID positive Initial heparin level 0.72 units/ml  Goal of Therapy:  Heparin level 0.3-0.7 units/ml Monitor platelets by anticoagulation protocol: Yes   Plan:  Continue heparin drip at 950 units/hr Daily HL and CBC Monitor for s/sx bleeding  Thanks for allowing pharmacy to be a part of this patient's care.  Excell Seltzer, PharmD Clinical Pharmacist 01/20/2021 11:31 PM

## 2021-01-30 NOTE — ED Provider Notes (Signed)
[facility] Provider Note   CSN: [id number] Arrival date & time: 01/29/2021  1004     History Chief Complaint  Patient presents with  . Chest Pain    Travis [name] is a 60 y.o. male.  60 year old male with history of MI (stent placed x 1 in November 2021, told he had 2 more blockages with plan for stent, sees Dr. [name]), ESRD (attends dialysis Tuesday/Thursday/Saturday, last full dialysis Saturday, completed 3 out of 4 hours today), a fib (on amiodarone and Brilinta), presents with complaint of chest pain. Patient first noticed his chest discomfort last Friday (4 days ago) after completing his PT discharge summary (in PT post MI and prolonged hospitalization). Pain has been intermittent worse with exertion, experiences SHOB with exertion, SHOB improves with rest, CP improves but persists. Pain is aching in nature, located left upper chest, radiates to left scapula area. Reports nausea and with 1 episode of emesis last night, denies diaphoresis. Patient went to dialysis today, thinking he would feel better if he had some fluid pulled off but discontinued treatment after 3 hours due to feeling worse. Unable to take ASA due to allergy (hives). Does not take Nitro.         Past Medical History:  Diagnosis Date  . Acute ST elevation myocardial infarction (STEMI) of anterolateral wall ([place]) 10/28/2020   Culprit lesion, 99% ulcerated mid LAD at D1 -> DES PCI  . Anemia of chronic disease 01/30/2015  . Chronic HFrEF (heart failure with reduced ejection fraction) ([place]) 10/28/2020   LVEDP in Cath 30-35 mmHg --> EF 45-50% with Inseminate filling  . Coronary artery disease involving native coronary artery of native heart with unstable angina pectoris ([place]) 10/28/2020   Acute anterolateral STEMI: 3V CAD: Culprit Lesion = 99% mid LAD lesion (just after D1) - DES PCI; 99% ostial AV groove LCx; & 80% calcified napkin ring proximal RCA lesion with extensive  calcification throughout the RCA. Successful PTCA and DES PCI of the LAD crossing D1 -resolute Onyx DES 2.75 mm x 18 mm postdilated to 3.1 mm. With PCI, there  . End-stage renal disease on hemodialysis ([place])   . GERD (gastroesophageal reflux disease)    takes Nexium  . Hyperlipidemia with target LDL less than 70 10/28/2020  . Hypertension   . Pneumonia    as a child  . Presence of drug coated stent in LAD coronary artery 10/28/2020   Proximal-mid LAD 99% ->0% -> Resolute Onyx DES 2.75 mm x 18 mm (3.1 mm)  . Sleep apnea    uses c-pap 2 yrs    Patient Active Problem List   Diagnosis Date Noted  . NSTEMI (non-ST elevated myocardial infarction) ([place]) 01/23/2021  . GERD with esophagitis 02/03/2021  . Long term current use of amiodarone 01/15/2021  . PAF (paroxysmal atrial fibrillation) ([place])   . Chest tube in place   . Empyema ([place])   . Pleural effusion   . Protein-calorie malnutrition, severe 11/14/2020  . Pressure injury of skin 11/09/2020  . Pneumonia of right lower lobe due to methicillin susceptible Staphylococcus aureus (MSSA) ([place]) 11/02/2020  . Mitral regurgitation 11/02/2020  . Cardiac arrest ([place])   . Dyspnea   . Bacteremia 10/30/2020  . Septic shock due to Staphylococcus aureus ([place]) 10/30/2020  . Atrial fibrillation with RVR ([place]) 10/29/2020  . Hyperlipidemia with target LDL less than 70 10/28/2020  . 3 V- CAD w/ ACS/STEMI: Culprit = 99% mLAD @ D1 (DES PCI jailing  D1); 99% ost-AVGLCx, 80% calcified napkin ring prox RCA.  10/28/2020  . Presence of drug coated stent in LAD coronary artery: Resolute Onyx DES 2.75 mm x 18 mm (3.1 mm) at major D1 & SP1. 10/28/2020  . Acute on chronic combined systolic and diastolic CHF (congestive heart failure) ([place]) 10/28/2020  . ST elevation myocardial infarction (STEMI) involving left anterior descending (LAD) coronary artery with complication ([place]) 45/40/9811  . Prolapsed internal hemorrhoids, grade 3, s/p ligation/pexy/hemorrhoidectomy  02/26/2018 02/26/2018  . Mass   . Anemia due to chronic blood loss 01/30/2015  . UGIB (upper gastrointestinal bleed) 01/30/2015  . Essential hypertension 01/30/2015  . Anemia due to GI blood loss 01/30/2015  . ESRD (end stage renal disease) on dialysis ([place]) 08/26/2014  . Drainage from wound-Left upper arm 08/11/2014    Past Surgical History:  Procedure Laterality Date  . AV FISTULA PLACEMENT Left 12/22/2017   Procedure: REPAIR PSEUDOANEURYSM ARTERIOVENOUS (AV) FISTULA;  Surgeon: [name], MD;  Location: [facility];  Service: Vascular;  Laterality: Left;  . BASCILIC VEIN TRANSPOSITION Left 01/05/2014   Procedure: LEFT 1ST STAGE BASCILIC VEIN TRANSPOSITION;  Surgeon: Conrad Kane, MD;  Location: [facility];  Service: Vascular;  Laterality: Left;  . BASCILIC VEIN TRANSPOSITION Left 07/26/2014   Procedure: Left Arm Brachial Vein Transposition Second Stage;  Surgeon: Conrad Edesville, MD;  Location: [facility];  Service: Vascular;  Laterality: Left;  . COLONOSCOPY    . COLONOSCOPY N/A 02/02/2015   Procedure: COLONOSCOPY;  Surgeon: [name], MD;  Location: [facility] ENDOSCOPY;  Service: Endoscopy;  Laterality: N/A;  . CORONARY STENT INTERVENTION N/A 10/28/2020   Procedure: CORONARY STENT INTERVENTION;  Surgeon: Leonie Man, MD;  Location: [facility] CV LAB;  Successful PTCA and DES PCI of the LAD crossing D1 -resolute Onyx DES 2.75 mm x 18 mm postdilated to 3.1 mm. -- 99% TO 0%  . CORONARY/GRAFT ACUTE MI REVASCULARIZATION N/A 10/28/2020   Procedure: Coronary/Graft Acute MI Revascularization;  Surgeon: Leonie Man, MD;  Location: [facility] CV LAB;  Service: Cardiovascular;  mid LAD 99% DES PCI  . ESOPHAGOGASTRODUODENOSCOPY Left 01/31/2015   Procedure: ESOPHAGOGASTRODUODENOSCOPY (EGD);  Surgeon: [name], MD;  Location: [facility] ENDOSCOPY;  Service: Endoscopy;  Laterality: Left;  . ESOPHAGOGASTRODUODENOSCOPY N/A 11/09/2020   Procedure: ESOPHAGOGASTRODUODENOSCOPY (EGD);  Surgeon: Clarene Essex, MD;   Location: [facility];  Service: Endoscopy;  Laterality: N/A;  . EVALUATION UNDER ANESTHESIA WITH HEMORRHOIDECTOMY N/A 02/26/2018   Procedure: ANORECTAL EXAM UNDER ANESTHESIA  HEMORRHOIDECTOMY x 2  HEMORRHOIDAL LIGATION AND PEXY;  Surgeon: Layth Boston, MD;  Location: [facility];  Service: General;  Laterality: N/A;  . GIVENS CAPSULE STUDY N/A 01/31/2015   Procedure: GIVENS CAPSULE STUDY;  Surgeon: [name], MD;  Location: [facility] ENDOSCOPY;  Service: Endoscopy;  Laterality: N/A;  . IR AV DIALY SHUNT INTRO NEEDLE/INTRAC INITIAL W/PTA/STENT/IMG LT Left 11/20/2020  . IR US GUIDE VASC ACCESS LEFT  11/20/2020  . LEFT HEART CATH AND CORONARY ANGIOGRAPHY N/A 10/28/2020   Procedure: LEFT HEART CATH AND CORONARY ANGIOGRAPHY;  Surgeon: Leonie Man, MD;  Location: [facility];  Service: Cardiovascular; - Acute Ant-Lat STEMI-> MV CAD: Culprit = 99% mLAD (just after major D1) => DES PCI; 99% ostial AVG LCx (poor PCI target), 80% prox RCA napkin ring lesion.  EF ~40-45% (anterior - ant-lat HK). LVEDP 30 mmHg. (Used AL1 Guide for both LCA & RCA).  . MOUTH SURGERY     teeth cleaning  . NEPHRECTOMY RADICAL Right 05/2015  [facility] Dr [name] for R renal mass  . TRANSESOPHAGEAL ECHOCARDIOGRAM  11/01/2020   EF 35-40% moderate decreased function. Severe LA dilation. No LAA thrombus. Moderate RA dilation. 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 Mild dilation of ascending aorta measuring 40 mm. Moderate grade 3 plaque..  . TRANSTHORACIC ECHOCARDIOGRAM  10/31/2020   -POST ANT STEMI (& CARDIAC ARREST) -  EF 35 to 40%. Moderate decreased function. Moderate concentric LVH GR 3 DD. Severe HK of the mid apical inferolateral and inferior wall with moderate HK of the apical septal and anterior wall. PA pressure estimated 43 mmHg. Severe LA dilation. Moderate RA  dilation.Moderate to severe MR.Aortic root dilated at 41 mm. RV PSP 15 mmHg.  Marland Kitchen TRANSTHORACIC ECHOCARDIOGRAM  11/26/2020   EF 45 to 50% with mildly decreased function - mid inferoseptal, apical septal & apical wall hypokinesis. . Moderate concentric LVH and indeterminate filling. Mild to moderate MR due to restricted PMV L with mitral annular calcification III B.       Family History  Problem Relation Age of Onset  . Hypertension Mother   . Hypertension Father   . Hypertension Sister     Social History   Tobacco Use  . Smoking status: Former Smoker    Years: 4.00    Types: Cigars    Quit date: 12/31/2012    Years since quitting: 8.0  . Smokeless tobacco: Never Used  Vaping Use  . Vaping Use: Never used  Substance Use Topics  . Alcohol use: No  . Drug use: No    Home Medications Prior to Admission medications   Medication Sig Start Date End Date Taking? Authorizing Provider  acetaminophen (TYLENOL) 325 MG tablet Take 650 mg by mouth every 6 (six) hours as needed for mild pain, fever or headache.   Yes [provider]  amiodarone (PACERONE) 200 MG tablet Take 1 tablet (200 mg total) by mouth daily. 01/13/21 03/14/21 Yes Strader, Fransisco Hertz, PA-C  atorvastatin (LIPITOR) 80 MG tablet Take 1 tablet (80 mg total) by mouth daily at 6 PM. 01/13/21 02/12/21 Yes Strader, Fransisco Hertz, PA-C  AURYXIA 1 GM 210 MG(Fe) tablet Take 210-630 mg by mouth See admin instructions. 630mg  three times daily with meals and 210-420mg  with snacks 10/03/17  Yes [provider]  cinacalcet (SENSIPAR) 60 MG tablet Take 120 mg by mouth daily. 10/14/20  Yes [provider]  heparin sodium, porcine, 1000 UNIT/ML injection Heparin (Porcine) Preserv Free 1,000 Units/mL Systemic 12/19/20 12/18/21 Yes [provider]  metoprolol tartrate (LOPRESSOR) 25 MG tablet Take 1 tablet (25 mg total) by mouth 2 (two) times daily. 01/24/21 02/23/21 Yes Leonie Man, MD  midodrine (PROAMATINE) 5 MG  tablet Take 5 mg by mouth 3 (three) times daily with meals. On dialysis days take 10mg  before as the first dose, and then 5mg  for the second and third dose of the day. 12/14/20  Yes [provider]  multivitamin (RENA-VIT) TABS tablet Take 1 tablet by mouth daily. 10/13/20  Yes [provider]  multivitamin-iron-minerals-folic acid (CENTRUM) chewable tablet Chew 1 tablet by mouth daily.   Yes [provider]  pantoprazole (PROTONIX) 40 MG tablet Take 1 tablet (40 mg total) by mouth 2 (two) times daily. Patient taking differently: Take 40 mg by mouth daily. 12/14/20 01/13/21 Yes Darliss Cheney, MD  ticagrelor (BRILINTA) 90 MG TABS tablet Take 1 tablet (90 mg total) by  mouth 2 (two) times daily. 01/13/21 03/14/21 Yes Strader, Fransisco Hertz, PA-C    Allergies    Ibuprofen, Lisinopril, and Naproxen  Review of Systems   Review of Systems  Constitutional: Negative for diaphoresis and fever.  Respiratory: Positive for shortness of breath.   Cardiovascular: Positive for chest pain. Negative for leg swelling.  Gastrointestinal: Positive for nausea and vomiting. Negative for abdominal pain, constipation and diarrhea.  Musculoskeletal: Positive for back pain.  Skin: Negative for rash and wound.  Allergic/Immunologic: Positive for immunocompromised state.  Neurological: Negative for weakness.  Psychiatric/Behavioral: Negative for confusion.  All other systems reviewed and are negative.   Physical Exam Updated Vital Signs BP 98/68   Pulse 76   Temp 98 F (36.7 C) (Oral)   Resp 20   Ht 5\' 11"  (1.803 m)   Wt 77.1 kg   SpO2 100%   BMI 23.71 kg/m   Physical Exam Vitals and nursing note reviewed.  Constitutional:      General: He is not in acute distress.    Appearance: He is well-developed and well-nourished. He is not diaphoretic.  HENT:     Head: Normocephalic and atraumatic.  Cardiovascular:     Rate and Rhythm: Normal rate and regular rhythm.     Heart sounds: Normal  heart sounds. No murmur heard.   Pulmonary:     Effort: Pulmonary effort is normal.     Breath sounds: Normal breath sounds.  Chest:     Chest wall: No tenderness.  Abdominal:     Palpations: Abdomen is soft.     Tenderness: There is no abdominal tenderness.  Musculoskeletal:     Right lower leg: No edema.     Left lower leg: No edema.  Skin:    General: Skin is warm and dry.     Findings: No erythema or rash.  Neurological:     Mental Status: He is alert and oriented to person, place, and time.  Psychiatric:        Mood and Affect: Mood and affect normal.        Behavior: Behavior normal.     ED Results / Procedures / Treatments   Labs (all labs ordered are listed, but only abnormal results are displayed) Labs Reviewed  BASIC METABOLIC PANEL - Abnormal; Notable for the following components:      Result Value   Chloride 95 (*)    Glucose, Bld 104 (*)    BUN 51 (*)    Creatinine, Ser 6.39 (*)    Calcium 8.4 (*)    GFR, Estimated 9 (*)    All other components within normal limits  CBC - Abnormal; Notable for the following components:   RBC 3.85 (*)    Hemoglobin 10.6 (*)    HCT 35.6 (*)    MCHC 29.8 (*)    RDW 17.9 (*)    All other components within normal limits  TROPONIN I (HIGH SENSITIVITY) - Abnormal; Notable for the following components:   Troponin I (High Sensitivity) 1,940 (*)    All other components within normal limits  TROPONIN I (HIGH SENSITIVITY) - Abnormal; Notable for the following components:   Troponin I (High Sensitivity) 2,324 (*)    All other components within normal limits  RESP PANEL BY RT-PCR (FLU A&B, COVID) ARPGX2  HEPARIN LEVEL (UNFRACTIONATED)  HIV ANTIBODY (ROUTINE TESTING W REFLEX)    EKG EKG Interpretation  Date/Time:  Tuesday January 30 2021 12:01:20 EST Ventricular Rate:  79 PR Interval:  208 QRS Duration: 114 QT Interval:  474 QTC Calculation: 544 R Axis:   -100 Text Interpretation: Sinus rhythm Prolonged PR interval  Left anterior fascicular block Anterior infarct, old Prolonged QT interval no STEMI. Confirmed by Charlesetta Shanks [phone]) on 01/19/2021 12:13:18 PM   Radiology DG Chest 2 View  Result Date: 01/13/2021 CLINICAL DATA:  Chest pain EXAM: CHEST - 2 VIEW COMPARISON:  12/08/2020 FINDINGS: Cardiac enlargement. Mild vascular congestion without edema. Small right pleural effusion unchanged with associated mild right lower lobe and right middle lobe airspace disease. Left lung base clear. No left effusion. IMPRESSION: Cardiac enlargement with vascular congestion suggesting mild fluid overload Small right effusion and mild right lower lobe atelectasis/infiltrate. Electronically Signed   By: Franchot Gallo M.D.   On: 01/16/2021 10:52    Procedures .Critical Care Performed by: Tacy Learn, PA-C Authorized by: Tacy Learn, PA-C   Critical care provider statement:    Critical care time (minutes):  45   Critical care was time spent personally by me on the following activities:  Discussions with consultants, evaluation of patient's response to treatment, examination of patient, ordering and performing treatments and interventions, ordering and review of laboratory studies, ordering and review of radiographic studies, pulse oximetry, re-evaluation of patient's condition, obtaining history from patient or surrogate and review of old charts     Medications Ordered in ED Medications  heparin bolus via infusion 4,000 Units (has no administration in time range)  heparin ADULT infusion 100 units/mL (25000 units/269mL) (has no administration in time range)  sodium chloride flush (NS) 0.9 % injection 3 mL (has no administration in time range)  pantoprazole (PROTONIX) injection 40 mg (has no administration in time range)  amiodarone (PACERONE) tablet 200 mg (has no administration in time range)  atorvastatin (LIPITOR) tablet 80 mg (has no administration in time range)  metoprolol tartrate (LOPRESSOR) tablet  25 mg (has no administration in time range)  midodrine (PROAMATINE) tablet 5 mg (has no administration in time range)  cinacalcet (SENSIPAR) tablet 120 mg (has no administration in time range)  acetaminophen (TYLENOL) tablet 650 mg (has no administration in time range)  ondansetron (ZOFRAN) injection 4 mg (has no administration in time range)  ticagrelor (BRILINTA) tablet 90 mg (has no administration in time range)  midodrine (PROAMATINE) tablet 10 mg (has no administration in time range)  midodrine (PROAMATINE) tablet 5 mg (has no administration in time range)  lactated ringers bolus 250 mL (250 mLs Intravenous New Bag/Given 01/09/2021 1435)  morphine 2 MG/ML injection 2 mg (2 mg Intravenous Given 01/22/2021 1430)    ED Course  I have reviewed the triage vital signs and the nursing notes.  Pertinent labs & imaging results that were available during my care of the patient were reviewed by me and considered in my medical decision making (see chart for details).  Clinical Course as of 01/10/2021 1451  Tue Jan 30, 7826  1339 60 year old male with complaint of left-sided chest pain as above.  EKG with nonspecific changes. Patient is followed by Dr. Selena Batten with cardiology.  Troponin is elevated at 1940.  Patient has an aspirin allergy (hives), unable to take aspirin.  Patient is on Brilinta for his A. fib. Unable to give morphine due to patient soft blood pressures in the ED with systolics in the 52D. Case discussed with Dr. Vallery Ridge, ER attending who has seen the patient and discussed case with cardiology Master.  Cardiology to see patient.  Hospitalist paged to consult for admission and  further management. Patient's blood pressure initially in the mid 90s, now 88/63. 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 Bps reviewed in flow sheet, patient has low pressures previously in the 90s.  [LM]  44 Cased discussed with Dr. Cyd Silence with [facility]  Hospitalist service, requests Cardiology direct consult for further guidance.  [LM]  1448 Case discussed with Dr. [name], patient's cardiologist who is in the Cath Lab today, agrees with starting heparin, requests cardiology team C consult. Aware of BP, acceptable for this patient.  Patient has been seen by Dr. [place]ique as well as Cephus Shelling, APP.  Plan is for patient to go to the Cath Lab today if possible if not will go tomorrow. Discussed cardiology plan with Dr. Cyd Silence.  [LM]    Clinical Course User Index [LM] Roque Lias   MDM Rules/Calculators/A&P                          Final Clinical Impression(s) / ED Diagnoses Final diagnoses:  NSTEMI (non-ST elevated myocardial infarction) [facility])    Rx / DC Orders ED Discharge Orders    None       Tacy Learn, PA-C 01/29/2021 1451    Charlesetta Shanks, MD 01/19/2021 [phone]

## 2021-01-30 NOTE — Telephone Encounter (Signed)
Travis [name] is returning Travis [name]'s call. She states Travis [name] is currently admitted, but she is unable to go in until he has a room so she has no further information she can give in regards to him. She stated if she is calling for further information she would need to contact the hospital herself until they allow her in. Please advise.

## 2021-01-30 NOTE — ED Triage Notes (Signed)
Pt reports L sided chest pain and SOB with exertion since Friday.  States he was receiving PT and was released from PT on Friday after having evaluation.  States he thinks the evaluation over exerted him.  Vomited last night.  Denies nausea.  Dialysis today was stopped 1 hr early due to chest pain.

## 2021-01-30 NOTE — H&P (Signed)
History and Physical    ULYS FAVIA MGN:[id number] DOB: [birth year] DOA: 02/03/2021  PCP: Lujean Amel, MD  Patient coming from: Hemodialysis   Chief Complaint:  Chief Complaint  Patient presents with  . Chest Pain     HPI:    60 year old male with past medical history of end-stage renal disease (Tues, thurs Sat), coronary artery disease (S/P STEMI S/P DES to LAD 10/28/2020), hypertension, atrial fibrillation with rapid ventricular response (S/P cardioversion x 2), PEA arrest, GERD with esophagitis(EGD 11/09/2020) who presents to [facility] hospital emergency department with complaints of malaise and chest discomfort.  Of note, patient underwent a prolonged hospitalization at [facility] from 10/28/2020 until 12/14/2020.  Patient initially presented as a STEMI requiring emergent catheterization with DES to LAD on 11/20.  Patient underwent prolonged hospitalization suffering acute hypoxic respiratory failure requiring mechanical ventilation.  Hospital course was complicated by atrial fibrillation with rapid jugular Swan status post cardioversion x2, stage IV sacral wound requiring wound VAC placement, PEA arrest, upper gastrointestinal bleeding with EGD performed 11/09/2020 finding esophagitis and ischemic areas over the duodenal bulb as well as a complicated pneumonia with bilateral lung abscesses and right pleural effusion/empyema the chest tube insertion as well as MSSA bacteremia.  Patient was eventually discharged on 12/14/2020.  Patient explains that this past Friday on 2/18 while the patient was participating with physical therapy during a particularly recovery session he began to experience chest discomfort.  Patient describes the chest discomfort as midsternal in location, moderate intensity, dull in quality and nonradiating.  Patient states that over the following couple of days this discomfort continued to wax and wane.  Chest discomfort would worsen with exertion and improved with  rest.  Patient denies any associated shortness of breath, cough, fevers or palpitations.  On Sunday, patient states that he was attempting to use a blower to clean his front porch when he "pulled a muscle" in his mid back.  Patient describes this back discomfort as moderate to severe in intensity, "pulling" in quality worse with movement and improved with rest.  With patient's ongoing symptoms of ongoing personal chest discomfort and generalized malaise with intermittent back pain patient continues to not feel well and when he participated in dialysis on the morning of 2/22 he stated that he felt so poorly that he asked that the dialysis session be ended early (after 3 hours) and afterwards he presented to [facility] emergency department for evaluation.  Upon evaluation in the emergency department, initial troponin was found to be 1940.  No evidence of recurrent STEMI was found on EKG.  Patient initially presented with chest discomfort that spontaneously resolved in the emergency department.  For patient's back discomfort patient was provided with 2 mg of IV morphine by the emergency department staff.  Patient also exhibited hypotension with systolic blood pressures between 80s and 90s in the emergency department and was provided with a 250 cc bolus.  Case was discussed with both Dr. [name] the patient's cardiologist as well as Dr. Martinique.  The recommendation by cardiology was for medicine to admit, for the initiation of a heparin infusion as well as continuation of patient's home regimen of Brilinta.  Patient is currently n.p.o. for potential cardiac catheterization today.  The hospitalist group has now been called to assess patient for admission to the hospital.  Review of Systems:   Review of Systems  Constitutional: Positive for malaise/fatigue.  Cardiovascular: Positive for chest pain.  Musculoskeletal: Positive for back pain.  Neurological: Positive for  weakness.  All other systems  reviewed and are negative.   Past Medical History:  Diagnosis Date  . Acute ST elevation myocardial infarction (STEMI) of anterolateral wall ([place]) 10/28/2020   Culprit lesion, 99% ulcerated mid LAD at D1 -> DES PCI  . Anemia of chronic disease 01/30/2015  . Chronic HFrEF (heart failure with reduced ejection fraction) ([place]) 10/28/2020   LVEDP in Cath 30-35 mmHg --> EF 45-50% with Inseminate filling  . Coronary artery disease involving native coronary artery of native heart with unstable angina pectoris ([place]) 10/28/2020   Acute anterolateral STEMI: 3V CAD: Culprit Lesion = 99% mid LAD lesion (just after D1) - DES PCI; 99% ostial AV groove LCx; & 80% calcified napkin ring proximal RCA lesion with extensive calcification throughout the RCA. Successful PTCA and DES PCI of the LAD crossing D1 -resolute Onyx DES 2.75 mm x 18 mm postdilated to 3.1 mm. With PCI, there  . End-stage renal disease on hemodialysis ([place])   . GERD (gastroesophageal reflux disease)    takes Nexium  . Hyperlipidemia with target LDL less than 70 10/28/2020  . Hypertension   . Pneumonia    as a child  . Presence of drug coated stent in LAD coronary artery 10/28/2020   Proximal-mid LAD 99% ->0% -> Resolute Onyx DES 2.75 mm x 18 mm (3.1 mm)  . Sleep apnea    uses c-pap 2 yrs    Past Surgical History:  Procedure Laterality Date  . AV FISTULA PLACEMENT Left 12/22/2017   Procedure: REPAIR PSEUDOANEURYSM ARTERIOVENOUS (AV) FISTULA;  Surgeon: [name], MD;  Location: [place];  Service: Vascular;  Laterality: Left;  . BASCILIC VEIN TRANSPOSITION Left 01/05/2014   Procedure: LEFT 1ST STAGE BASCILIC VEIN TRANSPOSITION;  Surgeon: Conrad McLoud, MD;  Location: [place];  Service: Vascular;  Laterality: Left;  . BASCILIC VEIN TRANSPOSITION Left 07/26/2014   Procedure: Left Arm Brachial Vein Transposition Second Stage;  Surgeon: Conrad Heath, MD;  Location: [place];  Service: Vascular;  Laterality: Left;  . COLONOSCOPY    . COLONOSCOPY  N/A 02/02/2015   Procedure: COLONOSCOPY;  Surgeon: [name], MD;  Location: [facility] ENDOSCOPY;  Service: Endoscopy;  Laterality: N/A;  . CORONARY STENT INTERVENTION N/A 10/28/2020   Procedure: CORONARY STENT INTERVENTION;  Surgeon: Leonie Man, MD;  Location: [place] CV LAB;  Successful PTCA and DES PCI of the LAD crossing D1 -resolute Onyx DES 2.75 mm x 18 mm postdilated to 3.1 mm. -- 99% TO 0%  . CORONARY/GRAFT ACUTE MI REVASCULARIZATION N/A 10/28/2020   Procedure: Coronary/Graft Acute MI Revascularization;  Surgeon: Leonie Man, MD;  Location: [place] CV LAB;  Service: Cardiovascular;  mid LAD 99% DES PCI  . ESOPHAGOGASTRODUODENOSCOPY Left 01/31/2015   Procedure: ESOPHAGOGASTRODUODENOSCOPY (EGD);  Surgeon: [name], MD;  Location: [facility] ENDOSCOPY;  Service: Endoscopy;  Laterality: Left;  . ESOPHAGOGASTRODUODENOSCOPY N/A 11/09/2020   Procedure: ESOPHAGOGASTRODUODENOSCOPY (EGD);  Surgeon: Clarene Essex, MD;  Location: [place];  Service: Endoscopy;  Laterality: N/A;  . EVALUATION UNDER ANESTHESIA WITH HEMORRHOIDECTOMY N/A 02/26/2018   Procedure: ANORECTAL EXAM UNDER ANESTHESIA  HEMORRHOIDECTOMY x 2  HEMORRHOIDAL LIGATION AND PEXY;  Surgeon: Rishit Boston, MD;  Location: [facility];  Service: General;  Laterality: N/A;  . GIVENS CAPSULE STUDY N/A 01/31/2015   Procedure: GIVENS CAPSULE STUDY;  Surgeon: [name], MD;  Location: [facility] ENDOSCOPY;  Service: Endoscopy;  Laterality: N/A;  . IR AV DIALY SHUNT INTRO NEEDLE/INTRAC INITIAL W/PTA/STENT/IMG LT Left 11/20/2020  . IR US  GUIDE VASC ACCESS LEFT  11/20/2020  . LEFT HEART CATH AND CORONARY ANGIOGRAPHY N/A 10/28/2020   Procedure: LEFT HEART CATH AND CORONARY ANGIOGRAPHY;  Surgeon: Leonie Man, MD;  Location: [place] CV LAB;  Service: Cardiovascular; - Acute Ant-Lat STEMI-> MV CAD: Culprit = 99% mLAD (just after major D1) => DES PCI; 99% ostial AVG LCx (poor PCI target), 80% prox RCA napkin ring lesion.  EF ~40-45% (anterior -  ant-lat HK). LVEDP 30 mmHg. (Used AL1 Guide for both LCA & RCA).  . MOUTH SURGERY     teeth cleaning  . NEPHRECTOMY RADICAL Right 05/2015   [facility] Dr [name] for R renal mass  . TRANSESOPHAGEAL ECHOCARDIOGRAM  11/01/2020   EF 35-40% moderate decreased function. Severe LA dilation. No LAA thrombus. Moderate RA dilation. 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 Mild dilation of ascending aorta measuring 40 mm. Moderate grade 3 plaque..  . TRANSTHORACIC ECHOCARDIOGRAM  10/31/2020   -POST ANT STEMI (& CARDIAC ARREST) -  EF 35 to 40%. Moderate decreased function. Moderate concentric LVH GR 3 DD. Severe HK of the mid apical inferolateral and inferior wall with moderate HK of the apical septal and anterior wall. PA pressure estimated 43 mmHg. Severe LA dilation. Moderate RA dilation.Moderate to severe MR.Aortic root dilated at 41 mm. RV PSP 15 mmHg.  Marland Kitchen TRANSTHORACIC ECHOCARDIOGRAM  11/26/2020   EF 45 to 50% with mildly decreased function - mid inferoseptal, apical septal & apical wall hypokinesis. . Moderate concentric LVH and indeterminate filling. Mild to moderate MR due to restricted PMV L with mitral annular calcification III B.     reports that he quit smoking about 8 years ago. His smoking use included cigars. He quit after 4.00 years of use. He has never used smokeless tobacco. He reports that he does not drink alcohol and does not use drugs.  Allergies  Allergen Reactions  . Ibuprofen Hives  . Lisinopril Swelling    PT states he is allergic to all prils; caused facial swelling  . Naproxen Hives and Other (See Comments)    Alleve causes patient to have hives    Family History  Problem Relation Age of Onset  . Hypertension Mother   . Hypertension Father   . Hypertension Sister      Prior to Admission medications   Medication Sig  Start Date End Date Taking? Authorizing Provider  acetaminophen (TYLENOL) 325 MG tablet Take 650 mg by mouth every 6 (six) hours as needed for mild pain, fever or headache.   Yes [provider]  amiodarone (PACERONE) 200 MG tablet Take 1 tablet (200 mg total) by mouth daily. 01/13/21 03/14/21 Yes Strader, Fransisco Hertz, PA-C  atorvastatin (LIPITOR) 80 MG tablet Take 1 tablet (80 mg total) by mouth daily at 6 PM. 01/13/21 02/12/21 Yes Strader, Fransisco Hertz, PA-C  AURYXIA 1 GM 210 MG(Fe) tablet Take 210-630 mg by mouth See admin instructions. 630mg  three times daily with meals and 210-420mg  with snacks 10/03/17  Yes [provider]  cinacalcet (SENSIPAR) 60 MG tablet Take 120 mg by mouth daily. 10/14/20  Yes [provider]  heparin sodium, porcine, 1000 UNIT/ML injection Heparin (Porcine) Preserv Free 1,000 Units/mL Systemic 12/19/20 12/18/21 Yes [provider]  metoprolol tartrate (LOPRESSOR) 25 MG tablet Take 1 tablet (25 mg total) by mouth 2 (two) times daily. 01/24/21 02/23/21 Yes Leonie Man, MD  midodrine ([place]) 5  MG tablet Take 5 mg by mouth 3 (three) times daily with meals. On dialysis days take 10mg  before as the first dose, and then 5mg  for the second and third dose of the day. 12/14/20  Yes [provider]  multivitamin (RENA-VIT) TABS tablet Take 1 tablet by mouth daily. 10/13/20  Yes [provider]  multivitamin-iron-minerals-folic acid (CENTRUM) chewable tablet Chew 1 tablet by mouth daily.   Yes [provider]  pantoprazole (PROTONIX) 40 MG tablet Take 1 tablet (40 mg total) by mouth 2 (two) times daily. Patient taking differently: Take 40 mg by mouth daily. 12/14/20 01/13/21 Yes Darliss Cheney, MD  ticagrelor (BRILINTA) 90 MG TABS tablet Take 1 tablet (90 mg total) by mouth 2 (two) times daily. 01/13/21 03/14/21 Yes Erma Heritage, [place]    Physical Exam: Vitals:   01/13/2021 1130 01/15/2021 1236 02/05/2021 1239 01/10/2021 1341  BP: 96/63  (!) 88/61 (!) 88/63 98/68  Pulse: 81 77 75 76  Resp: 18 18 14 20   Temp:      TempSrc:      SpO2: 96% 99% 99% 100%  Weight:    77.1 kg  Height:    5\' 11"  (1.803 m)    Constitutional: Lethargic but arousable and oriented x3, no associated distress.   Skin: Notable sacral wound with wound VAC in place.  Otherwise, good skin turgor noted. Eyes: Pupils are equally reactive to light.  No evidence of scleral icterus or conjunctival pallor.  ENMT: Moist mucous membranes noted.  Posterior pharynx clear of any exudate or lesions.   Neck: normal, supple, no masses, no thyromegaly.  No evidence of jugular venous distension.   Respiratory: Mild bibasilar and mid field rales without evidence of wheezing.   Normal respiratory effort. No accessory muscle use.  Cardiovascular: Regular rate and rhythm, no murmurs / rubs / gallops. No extremity edema. 2+ pedal pulses. No carotid bruits.  Chest:   Nontender without crepitus or deformity.   Back:   Nontender without crepitus or deformity. Abdomen: Abdomen is soft and nontender.  No evidence of intra-abdominal masses.  Positive bowel sounds noted in all quadrants.   Musculoskeletal: No joint deformity upper and lower extremities. Good ROM, no contractures. Normal muscle tone.  Neurologic: Patient is lethargic but arousable and oriented x3.  CN 2-12 grossly intact. Sensation intact.  Patient moving all 4 extremities spontaneously.  Patient is following all commands.  Patient is responsive to verbal stimuli.   Psychiatric: Patient exhibits depressed mood with appropriate affect.  Patient seems to possess insight as to their current situation.     Labs on Admission: I have personally reviewed following labs and imaging studies -   CBC: Recent Labs  Lab 01/28/2021 1027  WBC 9.6  HGB 10.6*  HCT 35.6*  MCV 92.5  PLT 500   Basic Metabolic Panel: Recent Labs  Lab 02/02/2021 1027  NA 139  K 3.7  CL 95*  CO2 30  GLUCOSE 104*  BUN 51*  CREATININE 6.39*   CALCIUM 8.4*   GFR: Estimated Creatinine Clearance: 13.4 mL/min (A) (by C-G formula based on SCr of 6.39 mg/dL (H)). Liver Function Tests: No results for input(s): AST, ALT, ALKPHOS, BILITOT, PROT, ALBUMIN in the last 168 hours. No results for input(s): LIPASE, AMYLASE in the last 168 hours. No results for input(s): AMMONIA in the last 168 hours. Coagulation Profile: No results for input(s): INR, PROTIME in the last 168 hours. Cardiac Enzymes: No results for input(s): CKTOTAL, CKMB, CKMBINDEX, TROPONINI in the  last 168 hours. BNP (last 3 results) No results for input(s): PROBNP in the last 8760 hours. HbA1C: No results for input(s): HGBA1C in the last 72 hours. CBG: No results for input(s): GLUCAP in the last 168 hours. Lipid Profile: No results for input(s): CHOL, HDL, LDLCALC, TRIG, CHOLHDL, LDLDIRECT in the last 72 hours. Thyroid Function Tests: No results for input(s): TSH, T4TOTAL, FREET4, T3FREE, THYROIDAB in the last 72 hours. Anemia Panel: No results for input(s): VITAMINB12, FOLATE, FERRITIN, TIBC, IRON, RETICCTPCT in the last 72 hours. Urine analysis:    Component Value Date/Time   COLORURINE YELLOW 11/13/2012 [place] 11/13/2012 1335   LABSPEC 1.015 11/13/2012 1335   PHURINE 5.5 11/13/2012 1335   GLUCOSEU NEGATIVE 11/13/2012 1335   HGBUR SMALL (A) 11/13/2012 1335   BILIRUBINUR NEGATIVE 11/13/2012 1335   KETONESUR NEGATIVE 11/13/2012 1335   PROTEINUR >300 (A) 11/13/2012 1335   UROBILINOGEN 0.2 11/13/2012 1335   NITRITE NEGATIVE 11/13/2012 1335   LEUKOCYTESUR NEGATIVE 11/13/2012 1335    Radiological Exams on Admission - Personally Reviewed: DG Chest 2 View  Result Date: 01/09/2021 CLINICAL DATA:  Chest pain EXAM: CHEST - 2 VIEW COMPARISON:  12/08/2020 FINDINGS: Cardiac enlargement. Mild vascular congestion without edema. Small right pleural effusion unchanged with associated mild right lower lobe and right middle lobe airspace disease. Left lung  base clear. No left effusion. IMPRESSION: Cardiac enlargement with vascular congestion suggesting mild fluid overload Small right effusion and mild right lower lobe atelectasis/infiltrate. Electronically Signed   By: Franchot Gallo M.D.   On: 01/26/2021 10:52    EKG: Personally reviewed.  Rhythm is normal sinus rhythm with heart rate of 84 bpm.  Q waves noted in the lateral leads.  No dynamic ST segment changes appreciated.  Assessment/Plan Principal Problem:   NSTEMI (non-ST elevated myocardial infarction) [facility])  Patient presenting with several days of exertional chest discomfort  Patient is also complaining of a component of back pain which could also be related  Troponin is consistent with NSTEMI in this patient with known substantial coronary disease status post STEMI with intervention 10/2020  Patient is already been initiated on a heparin infusion which we will continue at this time  Placing patient in progressive unit  Case has been discussed with Dr. Martinique with cardiology.  Dr. [name] with interventional cardiology is to take patient for cardiac catheterization either this evening or tomorrow  We will keep patient n.p.o. for now in case patient goes this evening  Continuing Brilinta, statin therapy  Of note, patient is allergic to aspirin  Active Problems:   Chronic combined systolic and diastolic CHF (congestive heart failure) (HCC)   No significant clinical evidence of cardiogenic volume overload in this patient at this time    ESRD (end stage renal disease) on dialysis [facility])   Case discussed with Dr. Jonnie Finner with nephrology for resumption of hemodialysis services while hospitalized.  Patient did undergo a partial dialysis session earlier in the day on 2/22.    PAF (paroxysmal atrial fibrillation) (HCC)   Currently normal sinus rhythm and rate controlled  Monitoring on telemetry  Home regimen of amiodarone    GERD with esophagitis   Continue home  regimen of Protonix    Stage IV pressure ulcer of sacral region [name])    Present on admission  Wound VAC in place  Wound care consult placed, recommendations are appreciated   Code Status:  Full code Family Communication: Spouse is at bedside who has been updated on plan of  care  Status is: Observation  The patient remains OBS appropriate and will d/c before 2 midnights.  Dispo: The patient is from: Home              Anticipated d/c is to: Home              Anticipated d/c date is: 2 days              Patient currently is not medically stable to d/c.   Difficult to place patient No        Vernelle Emerald MD [facility] Pager [phone]  If 7PM-7AM, please contact night-coverage [email or web address] Use universal [place] password for that web site. If you do not have the password, please call the hospital operator.  01/19/2021, 2:54 PM

## 2021-01-30 NOTE — Progress Notes (Signed)
Renal Navigator informed patient's outpatient HD clinic/NW of positive COVID test result for continuity of care. Navigator will continue to follow for discharge planning.  Alphonzo Cruise, [place] Renal Navigator [phone]

## 2021-01-30 NOTE — ED Notes (Signed)
IV team at bedside, This RN asked for two IVs.

## 2021-01-30 NOTE — Consult Note (Signed)
Cardiology Admission History and Physical:   Patient ID: Travis [name] MRN: [id number]; DOB: November 25, 1961   Admission date: 01/22/2021  PCP:  Lujean Amel, [place]  Cardiologist:  Glenetta Hew, MD  Chief Complaint:  Chest pain/SOB  Patient Profile:   Travis [name] is a 60 y.o. male with history of anterior STEMI November 2021 s/p DES to LAD and medical therapy for residual RCA and circumflex disease, post op atrial fibrillation, PEA arrest, MRSA bacteremia and pneumonia, hyperlipidemia, mitral regurgitation, ischemic cardiomyopathy, end-stage renal disease on hemodialysis presented with 2 days history of chest pain and shortness of breath who is seen at the request of Dr. Cyd Silence.    "Recent Hospitalizations:   10/28/2020-12/14/2020: Admitted with anterior STEMI-DES PCI LAD (plan was staged PCI of this RCA and medical management severely diseased AVG LCx) -> EDP was severely elevated at 30 mmHg. => Urgent HD later that day.  Was febrile overnight ? 11/21 -> A. fib RVR on shock, PEA arrest => driven by MSSA bacteremia; MSSA pneumonia => 6 weeks cefazolin ? 11/23: DCCV for recurrent A. Fib ==> ended up being intubated for Acute Respiratory Failure ? 11/24: Short-after to TEE, ; had brief bradycardic arrest ? Course complicated by UGI bleed (severe esophagitis (14 units PRBC) ? Short-term CRRT due to shock. ? MSSA PNA with bilateral lung abscesses and empyema requiring chest tube insertion => after initial stabilization, the tube was dislodged,   worsening respiratory failure requiring reintubation on 12/19 ==> Severe metabolic encephalopathy ; Precedex drip for delirium.  Also had Stage IV Sacral Decubitus Wound.->  Required intermittent debridement".  Patient was seen by Dr. [name] in clinic January 15, 2021.  Reported hypotension during dialysis.  He was advised to hold metoprolol on morning of dialysis and night prior.  Continue midodrine.  Plan for PCI when stable after next office visit. "Consider Shockwave Lithotripsy to facilitate debulking and PCI of the RCA.  We will take pictures of the LCx lesion in the LAD and the same procedure to confirm LAD stent patency.  The LCx would be something that we would only do if symptoms are untenable".   History of Present Illness:   Travis [name] released from physical therapy on Friday 01/26/21.  Patient may have done extraneous activity that session.  Since then he had intermittent chest pain on Friday and Saturday.  However reporting constant dull achy chest pressure since Sunday at rate of 7 out of 10.  Chest pain worse with activity.  Some back pain as well.  Intermittent dyspnea especially laying on left side.  He thought it may get improve after dialysis today which he had completed for 3-hour.  He continues to have chest pressure and came to ER directly from dialysis center.  Hs-Troponin 1940. CXR  Cardiac enlargement with vascular congestion suggesting mild fluid Overload. Small right effusion and mild right lower lobe atelectasis/infiltrate.  He hasn't eat today. Still with 7/10 chest discomfort. HGb 10.6  Past Medical History:  Diagnosis Date  . Acute ST elevation myocardial infarction (STEMI) of anterolateral wall ([place]) 10/28/2020   Culprit lesion, 99% ulcerated mid LAD at D1 -> DES PCI  . Anemia of chronic disease 01/30/2015  . Chronic HFrEF (heart failure with reduced ejection fraction) ([place]) 10/28/2020   LVEDP in Cath 30-35 mmHg --> EF 45-50% with Inseminate filling  . Coronary artery disease involving native coronary artery of native heart with unstable angina pectoris ([place]) 10/28/2020   Acute anterolateral  STEMI: 3V CAD: Culprit Lesion = 99% mid LAD lesion (just after D1) - DES PCI; 99% ostial AV groove LCx; & 80% calcified napkin ring proximal RCA lesion with extensive calcification throughout the RCA. Successful PTCA and DES PCI of the LAD crossing D1 -resolute Onyx DES 2.75  mm x 18 mm postdilated to 3.1 mm. With PCI, there  . End-stage renal disease on hemodialysis ([place])   . GERD (gastroesophageal reflux disease)    takes Nexium  . Hyperlipidemia with target LDL less than 70 10/28/2020  . Hypertension   . Pneumonia    as a child  . Presence of drug coated stent in LAD coronary artery 10/28/2020   Proximal-mid LAD 99% ->0% -> Resolute Onyx DES 2.75 mm x 18 mm (3.1 mm)  . Sleep apnea    uses c-pap 2 yrs    Past Surgical History:  Procedure Laterality Date  . AV FISTULA PLACEMENT Left 12/22/2017   Procedure: REPAIR PSEUDOANEURYSM ARTERIOVENOUS (AV) FISTULA;  Surgeon: [name], MD;  Location: [place];  Service: Vascular;  Laterality: Left;  . BASCILIC VEIN TRANSPOSITION Left 01/05/2014   Procedure: LEFT 1ST STAGE BASCILIC VEIN TRANSPOSITION;  Surgeon: Conrad Byers, MD;  Location: [place];  Service: Vascular;  Laterality: Left;  . BASCILIC VEIN TRANSPOSITION Left 07/26/2014   Procedure: Left Arm Brachial Vein Transposition Second Stage;  Surgeon: Conrad , MD;  Location: [place];  Service: Vascular;  Laterality: Left;  . COLONOSCOPY    . COLONOSCOPY N/A 02/02/2015   Procedure: COLONOSCOPY;  Surgeon: [name], MD;  Location: [facility] ENDOSCOPY;  Service: Endoscopy;  Laterality: N/A;  . CORONARY STENT INTERVENTION N/A 10/28/2020   Procedure: CORONARY STENT INTERVENTION;  Surgeon: Leonie Man, MD;  Location: [place] CV LAB;  Successful PTCA and DES PCI of the LAD crossing D1 -resolute Onyx DES 2.75 mm x 18 mm postdilated to 3.1 mm. -- 99% TO 0%  . CORONARY/GRAFT ACUTE MI REVASCULARIZATION N/A 10/28/2020   Procedure: Coronary/Graft Acute MI Revascularization;  Surgeon: Leonie Man, MD;  Location: [place] CV LAB;  Service: Cardiovascular;  mid LAD 99% DES PCI  . ESOPHAGOGASTRODUODENOSCOPY Left 01/31/2015   Procedure: ESOPHAGOGASTRODUODENOSCOPY (EGD);  Surgeon: [name], MD;  Location: [facility] ENDOSCOPY;  Service: Endoscopy;  Laterality: Left;  .  ESOPHAGOGASTRODUODENOSCOPY N/A 11/09/2020   Procedure: ESOPHAGOGASTRODUODENOSCOPY (EGD);  Surgeon: Clarene Essex, MD;  Location: [place];  Service: Endoscopy;  Laterality: N/A;  . EVALUATION UNDER ANESTHESIA WITH HEMORRHOIDECTOMY N/A 02/26/2018   Procedure: ANORECTAL EXAM UNDER ANESTHESIA  HEMORRHOIDECTOMY x 2  HEMORRHOIDAL LIGATION AND PEXY;  Surgeon: Zuri Boston, MD;  Location: [facility];  Service: General;  Laterality: N/A;  . GIVENS CAPSULE STUDY N/A 01/31/2015   Procedure: GIVENS CAPSULE STUDY;  Surgeon: [name], MD;  Location: [facility] ENDOSCOPY;  Service: Endoscopy;  Laterality: N/A;  . IR AV DIALY SHUNT INTRO NEEDLE/INTRAC INITIAL W/PTA/STENT/IMG LT Left 11/20/2020  . IR US GUIDE VASC ACCESS LEFT  11/20/2020  . LEFT HEART CATH AND CORONARY ANGIOGRAPHY N/A 10/28/2020   Procedure: LEFT HEART CATH AND CORONARY ANGIOGRAPHY;  Surgeon: Leonie Man, MD;  Location: [place] CV LAB;  Service: Cardiovascular; - Acute Ant-Lat STEMI-> MV CAD: Culprit = 99% mLAD (just after major D1) => DES PCI; 99% ostial AVG LCx (poor PCI target), 80% prox RCA napkin ring lesion.  EF ~40-45% (anterior - ant-lat HK). LVEDP 30 mmHg. (Used AL1 Guide for both LCA & RCA).  . MOUTH SURGERY     teeth  cleaning  . NEPHRECTOMY RADICAL Right 05/2015   [facility] Dr [name] for R renal mass  . TRANSESOPHAGEAL ECHOCARDIOGRAM  11/01/2020   EF 35-40% moderate decreased function. Severe LA dilation. No LAA thrombus. Moderate RA dilation. 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 Mild dilation of ascending aorta measuring 40 mm. Moderate grade 3 plaque..  . TRANSTHORACIC ECHOCARDIOGRAM  10/31/2020   -POST ANT STEMI (& CARDIAC ARREST) -  EF 35 to 40%. Moderate decreased function. Moderate concentric LVH GR 3 DD. Severe HK of the mid apical inferolateral and inferior wall with moderate  HK of the apical septal and anterior wall. PA pressure estimated 43 mmHg. Severe LA dilation. Moderate RA dilation.Moderate to severe TravisAortic root dilated at 41 mm. RV PSP 15 mmHg.  Marland Kitchen TRANSTHORACIC ECHOCARDIOGRAM  11/26/2020   EF 45 to 50% with mildly decreased function - mid inferoseptal, apical septal & apical wall hypokinesis. . Moderate concentric LVH and indeterminate filling. Mild to moderate MR due to restricted PMV L with mitral annular calcification III B.     Medications Prior to Admission: Prior to Admission medications   Medication Sig Start Date End Date Taking? Authorizing Provider  acetaminophen (TYLENOL) 325 MG tablet Take 650 mg by mouth every 6 (six) hours as needed for mild pain, fever or headache.   Yes [provider]  amiodarone (PACERONE) 200 MG tablet Take 1 tablet (200 mg total) by mouth daily. 01/13/21 03/14/21 Yes Strader, Fransisco Hertz, PA-C  atorvastatin (LIPITOR) 80 MG tablet Take 1 tablet (80 mg total) by mouth daily at 6 PM. 01/13/21 02/12/21 Yes Strader, Fransisco Hertz, PA-C  AURYXIA 1 GM 210 MG(Fe) tablet Take 210-630 mg by mouth See admin instructions. 630mg  three times daily with meals and 210-420mg  with snacks 10/03/17  Yes [provider]  cinacalcet (SENSIPAR) 60 MG tablet Take 120 mg by mouth daily. 10/14/20  Yes [provider]  heparin sodium, porcine, 1000 UNIT/ML injection Heparin (Porcine) Preserv Free 1,000 Units/mL Systemic 12/19/20 12/18/21 Yes [provider]  metoprolol tartrate (LOPRESSOR) 25 MG tablet Take 1 tablet (25 mg total) by mouth 2 (two) times daily. 01/24/21 02/23/21 Yes Leonie Man, MD  midodrine (PROAMATINE) 5 MG tablet Take 5 mg by mouth 3 (three) times daily with meals. On dialysis days take 10mg  before as the first dose, and then 5mg  for the second and third dose of the day. 12/14/20  Yes [provider]  multivitamin (RENA-VIT) TABS tablet Take 1 tablet by mouth daily. 10/13/20  Yes [provider]  multivitamin-iron-minerals-folic acid (CENTRUM) chewable tablet Chew 1 tablet by mouth daily.   Yes [provider]  pantoprazole (PROTONIX) 40 MG tablet Take 1 tablet (40 mg total) by mouth 2 (two) times daily. Patient taking differently: Take 40 mg by mouth daily. 12/14/20 01/13/21 Yes Darliss Cheney, MD  ticagrelor (BRILINTA) 90 MG TABS tablet Take 1 tablet (90 mg total) by mouth 2 (two) times daily. 01/13/21 03/14/21 Yes Strader, Fransisco Hertz, PA-C     Allergies:    Allergies  Allergen Reactions  . Ibuprofen Hives  . Lisinopril Swelling    PT states he is allergic to all prils; caused facial swelling  . Naproxen Hives and Other (See Comments)    Alleve causes patient to have hives    Social History:   Social History   Socioeconomic History  . Marital status: Married  Spouse name: Not on file  . Number of children: Not on file  . Years of education: Not on file  . Highest education level: Not on file  Occupational History  . Not on file  Tobacco Use  . Smoking status: Former Smoker    Years: 4.00    Types: Cigars    Quit date: 12/31/2012    Years since quitting: 8.0  . Smokeless tobacco: Never Used  Vaping Use  . Vaping Use: Never used  Substance and Sexual Activity  . Alcohol use: No  . Drug use: No  . Sexual activity: Not on file  Other Topics Concern  . Not on file  Social History Narrative  . Not on file   Social Determinants of Health   Financial Resource Strain: Not on file  Food Insecurity: Not on file  Transportation Needs: Not on file  Physical Activity: Not on file  Stress: Not on file  Social Connections: Not on file  Intimate Partner Violence: Not on file    Family History:   The patient's family history includes Hypertension in his father, mother, and sister.    ROS:  Please see the history of present illness.  All other ROS reviewed and negative.     Physical Exam/Data:   Vitals:   01/10/2021 1130 01/23/2021 1236 01/22/2021  1239 01/20/2021 1341  BP: 96/63 (!) 88/61 (!) 88/63 98/68  Pulse: 81 77 75 76  Resp: 18 18 14 20   Temp:      TempSrc:      SpO2: 96% 99% 99% 100%  Weight:    77.1 kg  Height:    5\' 11"  (1.803 m)   No intake or output data in the 24 hours ending 01/10/2021 1412 Last 3 Weights 01/23/2021 01/15/2021 12/14/2020  Weight (lbs) 170 lb 178 lb 9.6 oz 160 lb 15 oz  Weight (kg) 77.111 kg 81.012 kg 73 kg     Body mass index is 23.71 kg/m.  General:  Well nourished, well developed, in no acute distress HEENT: normal Lymph: no adenopathy Neck: no JVD Endocrine:  No thryomegaly Vascular: No carotid bruits; FA pulses 2+ bilaterally without bruits  Cardiac:  normal S1, S2; RRR; no murmur  Lungs:  clear to auscultation bilaterally, no wheezing, rhonchi or rales  Abd: soft, nontender, no hepatomegaly  Ext: no edema Musculoskeletal:  No deformities, BUE and BLE strength normal and equal Skin: warm and dry  Neuro:  CNs 2-12 intact, no focal abnormalities noted Psych:  Normal affect    EKG:  The ECG that was done today was personally reviewed and demonstrates SR, ST depression in inferior leads  Relevant CV Studies:  Echo 11/26/2020 1. Left ventricular ejection fraction, by estimation, is 45 to 50%. The  left ventricle has mildly decreased function. The left ventricle  demonstrates regional wall motion abnormalities (see scoring  diagram/findings for description). There is moderate  concentric left ventricular hypertrophy. Indeterminate diastolic filling  due to E-A fusion.  2. Right ventricular systolic function is normal. The right ventricular  size is normal. Tricuspid regurgitation signal is inadequate for assessing  PA pressure.  3. There is mild to moderate MR due to restricted PMVL due to mitral  annular calcification (IIIB). MR is slightly worse than the prior study,  but does not appear to be severe. The mitral valve is grossly normal. Mild  to moderate mitral valve  regurgitation. No  evidence of mitral stenosis. Moderate mitral annular  calcification.  4. The aortic valve  is tricuspid. There is mild calcification of the  aortic valve. There is mild thickening of the aortic valve. Aortic valve  regurgitation is trivial. Mild to moderate aortic valve  sclerosis/calcification is present, without any  evidence of aortic stenosis.  5. The inferior vena cava is dilated in size with >50% respiratory  variability, suggesting right atrial pressure of 8 mmHg.   Coronary/Graft Acute MI Revascularization  10/28/2020  CORONARY STENT INTERVENTION  LEFT HEART CATH AND CORONARY ANGIOGRAPHY    Conclusion    There is mild to moderate left ventricular systolic dysfunction. The left ventricular ejection fraction is 40-45% by visual estimate - > anterior-anterolateral apical hypokinesis  LV end diastolic pressure is severely elevated -> on recheck post PCI, EDP 30 mmHg  Mid LAD lesion is 99% stenosed. 1st Diag lesion is 55% stenosed just proximal to the lesion  A drug-eluting stent was successfully placed crossing the diagonal branch, using a [place] [id number]. Postdilated to 3.1 mm  Post intervention, there is a 0% residual stenosis.  LPAV lesion is 99% stenosed -> ostial lesion (LCx is 90  turn followed by another 90  turn into the AVG)  Prox RCA lesion is 80% stenosed -> concentric napkin ring calcified lesion.   SUMMARY  Acute anterolateral ST elevation MI with Culprit lesion being the 99% mid LAD lesion (just past major 1st Diag/D1) along with multivessel CAD including 99% ostial AV groove LCx and 80% calcified napkin ring proximal RCA lesion with extensive calcification throughout the RCA. ? Successful PTCA and DES PCI of the LAD crossing D1 -resolute Onyx DES 2.75 mm x 18 mm postdilated to 3.1 mm. ? With PCI, there was stabilization of significant ectopy, AIVR and PVCs  Mild to moderate reduced EF with anterior anterolateral hypokinesis, EF roughly 40 to  45%.  Initial evaluation suggested normal EDP, but on recheck post PCI LVEDP of 30 mmHg, severely elevated consistent with ACUTE DIASTOLIC HEART FAILURE  Significantly dilated aortic root requiring AL-1 guide catheter for both Left and Right Coronary Angiography.   RECOMMENDATIONS  Admit to CCU, restart IV heparin 8 hours after sheath removal  Check 2D echo for better assessment of EF  Plan to review cath films with other IC cardiologist, anticipate staged PCI of at least the RCA which may require atherectomy versus lithotripsy, and likely the ostial AV groove circumflex.  If he has signs of dyspnea, consider earlier dialysis than tomorrow. ->  Would consult for nephrology to see today.  He is already on labetalol and Norvasc.  We will continue current home medications.  Uninterrupted DAPT times X 1 year.  Diagnostic Dominance: Right    Intervention      Laboratory Data:  High Sensitivity Troponin:   Recent Labs  Lab 01/15/2021 1027  TROPONINIHS 1,940*      Chemistry Recent Labs  Lab 01/18/2021 1027  NA 139  K 3.7  CL 95*  CO2 30  GLUCOSE 104*  BUN 51*  CREATININE 6.39*  CALCIUM 8.4*  GFRNONAA 9*  ANIONGAP 14    Hematology Recent Labs  Lab 01/28/2021 1027  WBC 9.6  RBC 3.85*  HGB 10.6*  HCT 35.6*  MCV 92.5  MCH 27.5  MCHC 29.8*  RDW 17.9*  PLT 198   Radiology/Studies:  DG Chest 2 View  Result Date: 01/23/2021 CLINICAL DATA:  Chest pain EXAM: CHEST - 2 VIEW COMPARISON:  12/08/2020 FINDINGS: Cardiac enlargement. Mild vascular congestion without edema. Small right pleural effusion unchanged with associated mild right lower  lobe and right middle lobe airspace disease. Left lung base clear. No left effusion. IMPRESSION: Cardiac enlargement with vascular congestion suggesting mild fluid overload Small right effusion and mild right lower lobe atelectasis/infiltrate. Electronically Signed   By: Franchot Gallo M.D.   On: 02/05/2021 10:52     Assessment  and Plan:   1.  Non-STEMI with history of CAD s/p recent anterior STEMI 10/2020 s/p DES to LAD and residual disease in the RCA and circumflex -On going pain since Friday after physical therapy.  Did work out more than usual.  Constant 7/10 dull achy substernal chest discomfort.  Worse with exertion.  High-sensitivity troponin almost 2000.  EKG with inferior ST depression minimally. -Start IV heparin -Reports allergy to aspirin (hives many years ago) - On monotherapy with Brilinta -On heparin during dialysis -Keep NPO. Plan for cardiac catheterization later today by Dr. [name], if delay in the current case we will plan cath tomorrow.  2.  End-stage renal disease -Completed 3 hours of dialysis this morning  3.  Back pain -Pending CT angio of chest/abdomen/pelvis  4.  Postop atrial fibrillation -Maintaining sinus rhythm -Continue amiodarone -Not on DOAC   5.  Ischemic cardiomyopathy -EF of 40 to 45% by echo -Volume managed by dialysis -Advanced heart failure regimen limited due to ongoing hypotension - Continue home metoprolol (hold AM of dialysis and night prior)  Risk Assessment/Risk Scores:   TIMI Risk Score for Unstable Angina or Non-ST Elevation MI:   The patient's TIMI risk score is 5, which indicates a 26% risk of all cause mortality, new or recurrent myocardial infarction or need for urgent revascularization in the next 14 days.{   For questions or updates, please contact [place] Please consult [email or web address] for contact info under     Jarrett Soho, PA  01/28/2021 2:12 PM

## 2021-01-30 NOTE — Telephone Encounter (Signed)
Returned call to patient's wife no answer.Left message to call back. 

## 2021-01-30 NOTE — Telephone Encounter (Signed)
Patient currently in ED being evaluated for CP and SOB  Routed to MD to make aware.

## 2021-01-30 NOTE — Telephone Encounter (Signed)
Travis [name] is calling to inform Dr. [name] that Travis [name] is currently at dialysis, but they are in the process of taking him off so she can take him to the hospital due to his CP. She states he does not want to go by ambulance, but she is taking him now. I did not use a dot phrase due to the patient being otw to be admitted at the time of call. Please advise.

## 2021-01-30 NOTE — ED Notes (Signed)
Dr. Johnney Killian notified regarding his low BP.

## 2021-01-30 NOTE — ED Notes (Signed)
Dr. Johnney Killian notified of his elevated troponin. New EKG done. Cardiology consulted. Pt has no chest pain at this time.

## 2021-01-30 NOTE — ED Notes (Signed)
Unable to start any IV medications at this time as pt is very hard stick. Two RNs tried while waiting for IV team to come.

## 2021-01-30 NOTE — ED Provider Notes (Signed)
I provided a substantive portion of the care of this patient.  I personally performed the entirety of the history for this encounter.  EKG Interpretation  Date/Time:  Tuesday January 30 2021 10:09:20 EST Ventricular Rate:  84 PR Interval:  208 QRS Duration: 106 QT Interval:  416 QTC Calculation: 491 R Axis:   -121 Text Interpretation: Normal sinus rhythm Anterolateral infarct , age undetermined Abnormal ECG no STEMI. need repeat tracing for inferior lead artifact/wandering Confirmed by Charlesetta Shanks [phone]) on 01/29/2021 11:26:20 AM  Patient reports he has been having constant chest pain for about 2 days.  Sunday he developed pressure and fullness throughout the chest.  He reports it radiates into his central upper back.  It has been continuous in nature.  He thought maybe if he waited until dialysis on Tuesday, he would feel better.  During dialysis, he reports he was not feeling improved and determined he needed to come to the emergency department for further evaluation.  Patient has known severe coronary artery disease with MI in November and known high grade obstruction in RCA with anticipated staged management.  Alert and appropriate.  No respiratory distress at rest.  Pale in appearance.  Heart regular with S3 gallop.  Lungs grossly clear.  Abdomen soft and nondistended.  Trace lower extremity edema.  Calves nontender.  Patient presents as outlined.  He has had constant pain for several days.  He has known high risk coronary artery disease.  Troponin is elevated.  EKG does not show STEMI pattern but repeat tracing needed for better define inferior leads.  Plan for cardiology consult for NSTEMI\ACS with high risk medical history.    Charlesetta Shanks, MD 01/13/2021 1208

## 2021-01-30 NOTE — H&P (View-Only) (Signed)
Cardiology Admission History and Physical:   Patient ID: Travis [name] MRN: [id number]; DOB: [birth year]   Admission date: 02/05/2021  PCP:  Lujean Amel, [place]  Cardiologist:  Glenetta Hew, MD  Chief Complaint:  Chest pain/SOB  Patient Profile:   Travis [name] is a 60 y.o. male with history of anterior STEMI November 2021 s/p DES to LAD and medical therapy for residual RCA and circumflex disease, post op atrial fibrillation, PEA arrest, MRSA bacteremia and pneumonia, hyperlipidemia, mitral regurgitation, ischemic cardiomyopathy, end-stage renal disease on hemodialysis presented with 2 days history of chest pain and shortness of breath who is seen at the request of Dr. Cyd Silence.    "Recent Hospitalizations:   10/28/2020-12/14/2020: Admitted with anterior STEMI-DES PCI LAD (plan was staged PCI of this RCA and medical management severely diseased AVG LCx) -> EDP was severely elevated at 30 mmHg. => Urgent HD later that day.  Was febrile overnight ? 11/21 -> A. fib RVR on shock, PEA arrest => driven by MSSA bacteremia; MSSA pneumonia => 6 weeks cefazolin ? 11/23: DCCV for recurrent A. Fib ==> ended up being intubated for Acute Respiratory Failure ? 11/24: Short-after to TEE, ; had brief bradycardic arrest ? Course complicated by UGI bleed (severe esophagitis (14 units PRBC) ? Short-term CRRT due to shock. ? MSSA PNA with bilateral lung abscesses and empyema requiring chest tube insertion => after initial stabilization, the tube was dislodged,   worsening respiratory failure requiring reintubation on 12/19 ==> Severe metabolic encephalopathy ; Precedex drip for delirium.  Also had Stage IV Sacral Decubitus Wound.->  Required intermittent debridement".  Patient was seen by Dr. [name] in clinic January 15, 2021.  Reported hypotension during dialysis.  He was advised to hold metoprolol on morning of dialysis and night prior.  Continue midodrine.  Plan for PCI when stable after next office visit. "Consider Shockwave Lithotripsy to facilitate debulking and PCI of the RCA.  We will take pictures of the LCx lesion in the LAD and the same procedure to confirm LAD stent patency.  The LCx would be something that we would only do if symptoms are untenable".   History of Present Illness:   Mr. Heckendorn released from physical therapy on Friday 01/26/21.  Patient may have done extraneous activity that session.  Since then he had intermittent chest pain on Friday and Saturday.  However reporting constant dull achy chest pressure since Sunday at rate of 7 out of 10.  Chest pain worse with activity.  Some back pain as well.  Intermittent dyspnea especially laying on left side.  He thought it may get improve after dialysis today which he had completed for 3-hour.  He continues to have chest pressure and came to ER directly from dialysis center.  Hs-Troponin 1940. CXR  Cardiac enlargement with vascular congestion suggesting mild fluid Overload. Small right effusion and mild right lower lobe atelectasis/infiltrate.  He hasn't eat today. Still with 7/10 chest discomfort. HGb 10.6  Past Medical History:  Diagnosis Date  . Acute ST elevation myocardial infarction (STEMI) of anterolateral wall ([place]) 10/28/2020   Culprit lesion, 99% ulcerated mid LAD at D1 -> DES PCI  . Anemia of chronic disease 01/30/2015  . Chronic HFrEF (heart failure with reduced ejection fraction) ([place]) 10/28/2020   LVEDP in Cath 30-35 mmHg --> EF 45-50% with Inseminate filling  . Coronary artery disease involving native coronary artery of native heart with unstable angina pectoris ([place]) 10/28/2020   Acute anterolateral  STEMI: 3V CAD: Culprit Lesion = 99% mid LAD lesion (just after D1) - DES PCI; 99% ostial AV groove LCx; & 80% calcified napkin ring proximal RCA lesion with extensive calcification throughout the RCA. Successful PTCA and DES PCI of the LAD crossing D1 -resolute Onyx DES 2.75  mm x 18 mm postdilated to 3.1 mm. With PCI, there  . End-stage renal disease on hemodialysis ([place])   . GERD (gastroesophageal reflux disease)    takes Nexium  . Hyperlipidemia with target LDL less than 70 10/28/2020  . Hypertension   . Pneumonia    as a child  . Presence of drug coated stent in LAD coronary artery 10/28/2020   Proximal-mid LAD 99% ->0% -> Resolute Onyx DES 2.75 mm x 18 mm (3.1 mm)  . Sleep apnea    uses c-pap 2 yrs    Past Surgical History:  Procedure Laterality Date  . AV FISTULA PLACEMENT Left 12/22/2017   Procedure: REPAIR PSEUDOANEURYSM ARTERIOVENOUS (AV) FISTULA;  Surgeon: [name], MD;  Location: [place];  Service: Vascular;  Laterality: Left;  . BASCILIC VEIN TRANSPOSITION Left 01/05/2014   Procedure: LEFT 1ST STAGE BASCILIC VEIN TRANSPOSITION;  Surgeon: Conrad Spencer, MD;  Location: [place] [facility];  Service: Vascular;  Laterality: Left;  . BASCILIC VEIN TRANSPOSITION Left 07/26/2014   Procedure: Left Arm Brachial Vein Transposition Second Stage;  Surgeon: Conrad [place], MD;  Location: [place];  Service: Vascular;  Laterality: Left;  . COLONOSCOPY    . COLONOSCOPY N/A 02/02/2015   Procedure: COLONOSCOPY;  Surgeon: [name], MD;  Location: [facility] ENDOSCOPY;  Service: Endoscopy;  Laterality: N/A;  . CORONARY STENT INTERVENTION N/A 10/28/2020   Procedure: CORONARY STENT INTERVENTION;  Surgeon: Leonie Man, MD;  Location: [place] CV LAB;  Successful PTCA and DES PCI of the LAD crossing D1 -resolute Onyx DES 2.75 mm x 18 mm postdilated to 3.1 mm. -- 99% TO 0%  . CORONARY/GRAFT ACUTE MI REVASCULARIZATION N/A 10/28/2020   Procedure: Coronary/Graft Acute MI Revascularization;  Surgeon: Leonie Man, MD;  Location: [place] CV LAB;  Service: Cardiovascular;  mid LAD 99% DES PCI  . ESOPHAGOGASTRODUODENOSCOPY Left 01/31/2015   Procedure: ESOPHAGOGASTRODUODENOSCOPY (EGD);  Surgeon: [name], MD;  Location: [facility] ENDOSCOPY;  Service: Endoscopy;  Laterality: Left;  .  ESOPHAGOGASTRODUODENOSCOPY N/A 11/09/2020   Procedure: ESOPHAGOGASTRODUODENOSCOPY (EGD);  Surgeon: Clarene Essex, MD;  Location: [place];  Service: Endoscopy;  Laterality: N/A;  . EVALUATION UNDER ANESTHESIA WITH HEMORRHOIDECTOMY N/A 02/26/2018   Procedure: ANORECTAL EXAM UNDER ANESTHESIA  HEMORRHOIDECTOMY x 2  HEMORRHOIDAL LIGATION AND PEXY;  Surgeon: Eusebio Boston, MD;  Location: [facility];  Service: General;  Laterality: N/A;  . GIVENS CAPSULE STUDY N/A 01/31/2015   Procedure: GIVENS CAPSULE STUDY;  Surgeon: [name], MD;  Location: [facility] ENDOSCOPY;  Service: Endoscopy;  Laterality: N/A;  . IR AV DIALY SHUNT INTRO NEEDLE/INTRAC INITIAL W/PTA/STENT/IMG LT Left 11/20/2020  . IR US GUIDE VASC ACCESS LEFT  11/20/2020  . LEFT HEART CATH AND CORONARY ANGIOGRAPHY N/A 10/28/2020   Procedure: LEFT HEART CATH AND CORONARY ANGIOGRAPHY;  Surgeon: Leonie Man, MD;  Location: [place] CV LAB;  Service: Cardiovascular; - Acute Ant-Lat STEMI-> MV CAD: Culprit = 99% mLAD (just after major D1) => DES PCI; 99% ostial AVG LCx (poor PCI target), 80% prox RCA napkin ring lesion.  EF ~40-45% (anterior - ant-lat HK). LVEDP 30 mmHg. (Used AL1 Guide for both LCA & RCA).  . MOUTH SURGERY     teeth  cleaning  . NEPHRECTOMY RADICAL Right 05/2015   [name] for R renal mass  . TRANSESOPHAGEAL ECHOCARDIOGRAM  11/01/2020   EF 35-40% moderate decreased function. Severe LA dilation. No LAA thrombus. Moderate RA dilation. 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 Mild dilation of ascending aorta measuring 40 mm. Moderate grade 3 plaque..  . TRANSTHORACIC ECHOCARDIOGRAM  10/31/2020   -POST ANT STEMI (& CARDIAC ARREST) -  EF 35 to 40%. Moderate decreased function. Moderate concentric LVH GR 3 DD. Severe HK of the mid apical inferolateral and inferior wall with moderate  HK of the apical septal and anterior wall. PA pressure estimated 43 mmHg. Severe LA dilation. Moderate RA dilation.Moderate to severe MR.Aortic root dilated at 41 mm. RV PSP 15 mmHg.  Marland Kitchen TRANSTHORACIC ECHOCARDIOGRAM  11/26/2020   EF 45 to 50% with mildly decreased function - mid inferoseptal, apical septal & apical wall hypokinesis. . Moderate concentric LVH and indeterminate filling. Mild to moderate MR due to restricted PMV L with mitral annular calcification III B.     Medications Prior to Admission: Prior to Admission medications   Medication Sig Start Date End Date Taking? Authorizing Provider  acetaminophen (TYLENOL) 325 MG tablet Take 650 mg by mouth every 6 (six) hours as needed for mild pain, fever or headache.   Yes [provider]  amiodarone (PACERONE) 200 MG tablet Take 1 tablet (200 mg total) by mouth daily. 01/13/21 03/14/21 Yes Strader, Fransisco Hertz, PA-C  atorvastatin (LIPITOR) 80 MG tablet Take 1 tablet (80 mg total) by mouth daily at 6 PM. 01/13/21 02/12/21 Yes Strader, Fransisco Hertz, PA-C  AURYXIA 1 GM 210 MG(Fe) tablet Take 210-630 mg by mouth See admin instructions. 630mg  three times daily with meals and 210-420mg  with snacks 10/03/17  Yes [provider]  cinacalcet (SENSIPAR) 60 MG tablet Take 120 mg by mouth daily. 10/14/20  Yes [provider]  heparin sodium, porcine, 1000 UNIT/ML injection Heparin (Porcine) Preserv Free 1,000 Units/mL Systemic 12/19/20 12/18/21 Yes [provider]  metoprolol tartrate (LOPRESSOR) 25 MG tablet Take 1 tablet (25 mg total) by mouth 2 (two) times daily. 01/24/21 02/23/21 Yes Leonie Man, MD  midodrine (PROAMATINE) 5 MG tablet Take 5 mg by mouth 3 (three) times daily with meals. On dialysis days take 10mg  before as the first dose, and then 5mg  for the second and third dose of the day. 12/14/20  Yes [provider]  multivitamin (RENA-VIT) TABS tablet Take 1 tablet by mouth daily. 10/13/20  Yes [provider]  multivitamin-iron-minerals-folic acid (CENTRUM) chewable tablet Chew 1 tablet by mouth daily.   Yes [provider]  pantoprazole (PROTONIX) 40 MG tablet Take 1 tablet (40 mg total) by mouth 2 (two) times daily. Patient taking differently: Take 40 mg by mouth daily. 12/14/20 01/13/21 Yes Darliss Cheney, MD  ticagrelor (BRILINTA) 90 MG TABS tablet Take 1 tablet (90 mg total) by mouth 2 (two) times daily. 01/13/21 03/14/21 Yes Strader, Fransisco Hertz, PA-C     Allergies:    Allergies  Allergen Reactions  . Ibuprofen Hives  . Lisinopril Swelling    PT states he is allergic to all prils; caused facial swelling  . Naproxen Hives and Other (See Comments)    Alleve causes patient to have hives    Social History:   Social History   Socioeconomic History  . Marital status: Married  Spouse name: Not on file  . Number of children: Not on file  . Years of education: Not on file  . Highest education level: Not on file  Occupational History  . Not on file  Tobacco Use  . Smoking status: Former Smoker    Years: 4.00    Types: Cigars    Quit date: 12/31/2012    Years since quitting: 8.0  . Smokeless tobacco: Never Used  Vaping Use  . Vaping Use: Never used  Substance and Sexual Activity  . Alcohol use: No  . Drug use: No  . Sexual activity: Not on file  Other Topics Concern  . Not on file  Social History Narrative  . Not on file   Social Determinants of Health   Financial Resource Strain: Not on file  Food Insecurity: Not on file  Transportation Needs: Not on file  Physical Activity: Not on file  Stress: Not on file  Social Connections: Not on file  Intimate Partner Violence: Not on file    Family History:   The patient's family history includes Hypertension in his father, mother, and sister.    ROS:  Please see the history of present illness.  All other ROS reviewed and negative.     Physical Exam/Data:   Vitals:   01/16/2021 1130 01/17/2021 1236 01/18/2021  1239 01/15/2021 1341  BP: 96/63 (!) 88/61 (!) 88/63 98/68  Pulse: 81 77 75 76  Resp: 18 18 14 20   Temp:      TempSrc:      SpO2: 96% 99% 99% 100%  Weight:    77.1 kg  Height:    5\' 11"  (1.803 m)   No intake or output data in the 24 hours ending 01/15/2021 1412 Last 3 Weights 02/03/2021 01/15/2021 12/14/2020  Weight (lbs) 170 lb 178 lb 9.6 oz 160 lb 15 oz  Weight (kg) 77.111 kg 81.012 kg 73 kg     Body mass index is 23.71 kg/m.  General:  Well nourished, well developed, in no acute distress HEENT: normal Lymph: no adenopathy Neck: no JVD Endocrine:  No thryomegaly Vascular: No carotid bruits; FA pulses 2+ bilaterally without bruits  Cardiac:  normal S1, S2; RRR; no murmur  Lungs:  clear to auscultation bilaterally, no wheezing, rhonchi or rales  Abd: soft, nontender, no hepatomegaly  Ext: no edema Musculoskeletal:  No deformities, BUE and BLE strength normal and equal Skin: warm and dry  Neuro:  CNs 2-12 intact, no focal abnormalities noted Psych:  Normal affect    EKG:  The ECG that was done today was personally reviewed and demonstrates SR, ST depression in inferior leads  Relevant CV Studies:  Echo 11/26/2020 1. Left ventricular ejection fraction, by estimation, is 45 to 50%. The  left ventricle has mildly decreased function. The left ventricle  demonstrates regional wall motion abnormalities (see scoring  diagram/findings for description). There is moderate  concentric left ventricular hypertrophy. Indeterminate diastolic filling  due to E-A fusion.  2. Right ventricular systolic function is normal. The right ventricular  size is normal. Tricuspid regurgitation signal is inadequate for assessing  PA pressure.  3. There is mild to moderate MR due to restricted PMVL due to mitral  annular calcification (IIIB). MR is slightly worse than the prior study,  but does not appear to be severe. The mitral valve is grossly normal. Mild  to moderate mitral valve  regurgitation. No  evidence of mitral stenosis. Moderate mitral annular  calcification.  4. The aortic valve  is tricuspid. There is mild calcification of the  aortic valve. There is mild thickening of the aortic valve. Aortic valve  regurgitation is trivial. Mild to moderate aortic valve  sclerosis/calcification is present, without any  evidence of aortic stenosis.  5. The inferior vena cava is dilated in size with >50% respiratory  variability, suggesting right atrial pressure of 8 mmHg.   Coronary/Graft Acute MI Revascularization  10/28/2020  CORONARY STENT INTERVENTION  LEFT HEART CATH AND CORONARY ANGIOGRAPHY    Conclusion    There is mild to moderate left ventricular systolic dysfunction. The left ventricular ejection fraction is 40-45% by visual estimate - > anterior-anterolateral apical hypokinesis  LV end diastolic pressure is severely elevated -> on recheck post PCI, EDP 30 mmHg  Mid LAD lesion is 99% stenosed. 1st Diag lesion is 55% stenosed just proximal to the lesion  A drug-eluting stent was successfully placed crossing the diagonal branch, using a [place] [id number]. Postdilated to 3.1 mm  Post intervention, there is a 0% residual stenosis.  LPAV lesion is 99% stenosed -> ostial lesion (LCx is 90  turn followed by another 90  turn into the AVG)  Prox RCA lesion is 80% stenosed -> concentric napkin ring calcified lesion.   SUMMARY  Acute anterolateral ST elevation MI with Culprit lesion being the 99% mid LAD lesion (just past major 1st Diag/D1) along with multivessel CAD including 99% ostial AV groove LCx and 80% calcified napkin ring proximal RCA lesion with extensive calcification throughout the RCA. ? Successful PTCA and DES PCI of the LAD crossing D1 -resolute Onyx DES 2.75 mm x 18 mm postdilated to 3.1 mm. ? With PCI, there was stabilization of significant ectopy, AIVR and PVCs  Mild to moderate reduced EF with anterior anterolateral hypokinesis, EF roughly 40 to  45%.  Initial evaluation suggested normal EDP, but on recheck post PCI LVEDP of 30 mmHg, severely elevated consistent with ACUTE DIASTOLIC HEART FAILURE  Significantly dilated aortic root requiring AL-1 guide catheter for both Left and Right Coronary Angiography.   RECOMMENDATIONS  Admit to CCU, restart IV heparin 8 hours after sheath removal  Check 2D echo for better assessment of EF  Plan to review cath films with other IC cardiologist, anticipate staged PCI of at least the RCA which may require atherectomy versus lithotripsy, and likely the ostial AV groove circumflex.  If he has signs of dyspnea, consider earlier dialysis than tomorrow. ->  Would consult for nephrology to see today.  He is already on labetalol and Norvasc.  We will continue current home medications.  Uninterrupted DAPT times X 1 year.  Diagnostic Dominance: Right    Intervention      Laboratory Data:  High Sensitivity Troponin:   Recent Labs  Lab 01/29/2021 1027  TROPONINIHS 1,940*      Chemistry Recent Labs  Lab 01/11/2021 1027  NA 139  K 3.7  CL 95*  CO2 30  GLUCOSE 104*  BUN 51*  CREATININE 6.39*  CALCIUM 8.4*  GFRNONAA 9*  ANIONGAP 14    Hematology Recent Labs  Lab 01/20/2021 1027  WBC 9.6  RBC 3.85*  HGB 10.6*  HCT 35.6*  MCV 92.5  MCH 27.5  MCHC 29.8*  RDW 17.9*  PLT 198   Radiology/Studies:  DG Chest 2 View  Result Date: 01/16/2021 CLINICAL DATA:  Chest pain EXAM: CHEST - 2 VIEW COMPARISON:  12/08/2020 FINDINGS: Cardiac enlargement. Mild vascular congestion without edema. Small right pleural effusion unchanged with associated mild right lower  lobe and right middle lobe airspace disease. Left lung base clear. No left effusion. IMPRESSION: Cardiac enlargement with vascular congestion suggesting mild fluid overload Small right effusion and mild right lower lobe atelectasis/infiltrate. Electronically Signed   By: Franchot Gallo M.D.   On: 01/29/2021 10:52     Assessment  and Plan:   1.  Non-STEMI with history of CAD s/p recent anterior STEMI 10/2020 s/p DES to LAD and residual disease in the RCA and circumflex -On going pain since Friday after physical therapy.  Did work out more than usual.  Constant 7/10 dull achy substernal chest discomfort.  Worse with exertion.  High-sensitivity troponin almost 2000.  EKG with inferior ST depression minimally. -Start IV heparin -Reports allergy to aspirin (hives many years ago) - On monotherapy with Brilinta -On heparin during dialysis -Keep NPO. Plan for cardiac catheterization later today by Dr. [name], if delay in the current case we will plan cath tomorrow.  2.  End-stage renal disease -Completed 3 hours of dialysis this morning  3.  Back pain -Pending CT angio of chest/abdomen/pelvis  4.  Postop atrial fibrillation -Maintaining sinus rhythm -Continue amiodarone -Not on DOAC   5.  Ischemic cardiomyopathy -EF of 40 to 45% by echo -Volume managed by dialysis -Advanced heart failure regimen limited due to ongoing hypotension - Continue home metoprolol (hold AM of dialysis and night prior)  Risk Assessment/Risk Scores:   TIMI Risk Score for Unstable Angina or Non-ST Elevation MI:   The patient's TIMI risk score is 5, which indicates a 26% risk of all cause mortality, new or recurrent myocardial infarction or need for urgent revascularization in the next 14 days.{   For questions or updates, please contact [place] Please consult [email or web address] for contact info under     Jarrett Soho, PA  02/04/2021 2:12 PM

## 2021-01-30 NOTE — ED Notes (Signed)
Notified Dr. Cyd Silence that patient is COVID +.

## 2021-01-30 NOTE — Progress Notes (Signed)
ANTICOAGULATION CONSULT NOTE - Initial Consult  Pharmacy Consult for heparin Indication: ACS/STEMI  Allergies  Allergen Reactions  . Ibuprofen Hives  . Lisinopril Swelling    PT states he is allergic to all prils; caused facial swelling  . Naproxen Hives and Other (See Comments)    Alleve causes patient to have hives    Patient Measurements: Height: 5\' 11"  (180.3 cm) Weight: 77.1 kg (170 lb) IBW/kg (Calculated) : 75.3 Heparin Dosing Weight: 77.1  Vital Signs: Temp: 98 F (36.7 C) (02/22 1009) Temp Source: Oral (02/22 1009) BP: 98/68 (02/22 1341) Pulse Rate: 76 (02/22 1341)  Labs: Recent Labs    01/10/2021 1027  HGB 10.6*  HCT 35.6*  PLT 198  CREATININE 6.39*  TROPONINIHS 1,940*    Estimated Creatinine Clearance: 13.4 mL/min (A) (by C-G formula based on SCr of 6.39 mg/dL (H)).   Medical History: Past Medical History:  Diagnosis Date  . Acute ST elevation myocardial infarction (STEMI) of anterolateral wall ([place]) 10/28/2020   Culprit lesion, 99% ulcerated mid LAD at D1 -> DES PCI  . Anemia of chronic disease 01/30/2015  . Chronic HFrEF (heart failure with reduced ejection fraction) ([place]) 10/28/2020   LVEDP in Cath 30-35 mmHg --> EF 45-50% with Inseminate filling  . Coronary artery disease involving native coronary artery of native heart with unstable angina pectoris ([place]) 10/28/2020   Acute anterolateral STEMI: 3V CAD: Culprit Lesion = 99% mid LAD lesion (just after D1) - DES PCI; 99% ostial AV groove LCx; & 80% calcified napkin ring proximal RCA lesion with extensive calcification throughout the RCA. Successful PTCA and DES PCI of the LAD crossing D1 -resolute Onyx DES 2.75 mm x 18 mm postdilated to 3.1 mm. With PCI, there  . End-stage renal disease on hemodialysis ([place])   . GERD (gastroesophageal reflux disease)    takes Nexium  . Hyperlipidemia with target LDL less than 70 10/28/2020  . Hypertension   . Pneumonia    as a child  . Presence of drug coated stent  in LAD coronary artery 10/28/2020   Proximal-mid LAD 99% ->0% -> Resolute Onyx DES 2.75 mm x 18 mm (3.1 mm)  . Sleep apnea    uses c-pap 2 yrs    Medications:  Scheduled:  . heparin  4,000 Units Intravenous Once  .  morphine injection  2 mg Intravenous Once    Assessment: 60 yo admitted for chest pain x 2 days that radiates into upper back.  Hx of CAD w/ MI last November with DES placed.  On Brilinta PTA.  Cardiology consulted to rule out NSTEMI/ACS given PMH.  Troponins 1940, CBC stable. Pharmacy consulted to dose heparin.    Goal of Therapy:  Heparin level 0.3-0.7 units/ml Monitor platelets by anticoagulation protocol: Yes   Plan:  Give heparin 4000 units bolus x 1  Start heparin drip at 950 units/hr F/u 8 hour HL F/u cardiology plans Monitor daily CBC, s/sx bleeding  Dimple Nanas, PharmD PGY-1 Acute Care Pharmacy Resident Office: [phone] 02/02/2021 1:52 PM

## 2021-01-31 ENCOUNTER — Encounter (HOSPITAL_COMMUNITY): Payer: Self-pay | Admitting: Cardiovascular Disease

## 2021-01-31 ENCOUNTER — Encounter (HOSPITAL_COMMUNITY): Admission: EM | Disposition: E | Payer: Self-pay | Source: Ambulatory Visit | Attending: Internal Medicine

## 2021-01-31 ENCOUNTER — Observation Stay (HOSPITAL_COMMUNITY): Payer: Medicare Other

## 2021-01-31 DIAGNOSIS — I469 Cardiac arrest, cause unspecified: Secondary | ICD-10-CM | POA: Diagnosis not present

## 2021-01-31 DIAGNOSIS — I5042 Chronic combined systolic (congestive) and diastolic (congestive) heart failure: Secondary | ICD-10-CM

## 2021-01-31 DIAGNOSIS — Z992 Dependence on renal dialysis: Secondary | ICD-10-CM | POA: Diagnosis not present

## 2021-01-31 DIAGNOSIS — I132 Hypertensive heart and chronic kidney disease with heart failure and with stage 5 chronic kidney disease, or end stage renal disease: Secondary | ICD-10-CM | POA: Diagnosis present

## 2021-01-31 DIAGNOSIS — E785 Hyperlipidemia, unspecified: Secondary | ICD-10-CM | POA: Diagnosis present

## 2021-01-31 DIAGNOSIS — N186 End stage renal disease: Secondary | ICD-10-CM | POA: Diagnosis present

## 2021-01-31 DIAGNOSIS — I214 Non-ST elevation (NSTEMI) myocardial infarction: Secondary | ICD-10-CM

## 2021-01-31 DIAGNOSIS — I462 Cardiac arrest due to underlying cardiac condition: Secondary | ICD-10-CM | POA: Diagnosis not present

## 2021-01-31 DIAGNOSIS — I472 Ventricular tachycardia: Secondary | ICD-10-CM | POA: Diagnosis not present

## 2021-01-31 DIAGNOSIS — U071 COVID-19: Secondary | ICD-10-CM

## 2021-01-31 DIAGNOSIS — I10 Essential (primary) hypertension: Secondary | ICD-10-CM

## 2021-01-31 DIAGNOSIS — R570 Cardiogenic shock: Secondary | ICD-10-CM | POA: Diagnosis not present

## 2021-01-31 DIAGNOSIS — Z87891 Personal history of nicotine dependence: Secondary | ICD-10-CM | POA: Diagnosis not present

## 2021-01-31 DIAGNOSIS — I252 Old myocardial infarction: Secondary | ICD-10-CM | POA: Diagnosis not present

## 2021-01-31 DIAGNOSIS — Z8249 Family history of ischemic heart disease and other diseases of the circulatory system: Secondary | ICD-10-CM | POA: Diagnosis not present

## 2021-01-31 DIAGNOSIS — J9601 Acute respiratory failure with hypoxia: Secondary | ICD-10-CM | POA: Diagnosis not present

## 2021-01-31 DIAGNOSIS — Z888 Allergy status to other drugs, medicaments and biological substances status: Secondary | ICD-10-CM | POA: Diagnosis not present

## 2021-01-31 DIAGNOSIS — L89154 Pressure ulcer of sacral region, stage 4: Secondary | ICD-10-CM | POA: Diagnosis present

## 2021-01-31 DIAGNOSIS — N2581 Secondary hyperparathyroidism of renal origin: Secondary | ICD-10-CM | POA: Diagnosis present

## 2021-01-31 DIAGNOSIS — Z79899 Other long term (current) drug therapy: Secondary | ICD-10-CM | POA: Diagnosis not present

## 2021-01-31 DIAGNOSIS — Z955 Presence of coronary angioplasty implant and graft: Secondary | ICD-10-CM | POA: Diagnosis not present

## 2021-01-31 DIAGNOSIS — G4730 Sleep apnea, unspecified: Secondary | ICD-10-CM | POA: Diagnosis present

## 2021-01-31 DIAGNOSIS — J9811 Atelectasis: Secondary | ICD-10-CM | POA: Diagnosis present

## 2021-01-31 DIAGNOSIS — I2510 Atherosclerotic heart disease of native coronary artery without angina pectoris: Secondary | ICD-10-CM | POA: Diagnosis present

## 2021-01-31 DIAGNOSIS — I444 Left anterior fascicular block: Secondary | ICD-10-CM | POA: Diagnosis present

## 2021-01-31 DIAGNOSIS — Z8674 Personal history of sudden cardiac arrest: Secondary | ICD-10-CM | POA: Diagnosis not present

## 2021-01-31 HISTORY — PX: LEFT HEART CATH AND CORONARY ANGIOGRAPHY: CATH118249

## 2021-01-31 LAB — CBC
HCT: 36.6 % — ABNORMAL LOW (ref 39.0–52.0)
Hemoglobin: 11.4 g/dL — ABNORMAL LOW (ref 13.0–17.0)
MCH: 28.9 pg (ref 26.0–34.0)
MCHC: 31.1 g/dL (ref 30.0–36.0)
MCV: 92.9 fL (ref 80.0–100.0)
Platelets: 185 10*3/uL (ref 150–400)
RBC: 3.94 MIL/uL — ABNORMAL LOW (ref 4.22–5.81)
RDW: 18.3 % — ABNORMAL HIGH (ref 11.5–15.5)
WBC: 10.7 10*3/uL — ABNORMAL HIGH (ref 4.0–10.5)
nRBC: 0.2 % (ref 0.0–0.2)

## 2021-01-31 LAB — CBC WITH DIFFERENTIAL/PLATELET
Abs Immature Granulocytes: 0.06 10*3/uL (ref 0.00–0.07)
Basophils Absolute: 0 10*3/uL (ref 0.0–0.1)
Basophils Relative: 0 %
Eosinophils Absolute: 0 10*3/uL (ref 0.0–0.5)
Eosinophils Relative: 0 %
HCT: 35.2 % — ABNORMAL LOW (ref 39.0–52.0)
Hemoglobin: 10.8 g/dL — ABNORMAL LOW (ref 13.0–17.0)
Immature Granulocytes: 1 %
Lymphocytes Relative: 15 %
Lymphs Abs: 1.4 10*3/uL (ref 0.7–4.0)
MCH: 28.3 pg (ref 26.0–34.0)
MCHC: 30.7 g/dL (ref 30.0–36.0)
MCV: 92.1 fL (ref 80.0–100.0)
Monocytes Absolute: 0.9 10*3/uL (ref 0.1–1.0)
Monocytes Relative: 9 %
Neutro Abs: 7.2 10*3/uL (ref 1.7–7.7)
Neutrophils Relative %: 75 %
Platelets: 195 10*3/uL (ref 150–400)
RBC: 3.82 MIL/uL — ABNORMAL LOW (ref 4.22–5.81)
RDW: 18 % — ABNORMAL HIGH (ref 11.5–15.5)
WBC: 9.6 10*3/uL (ref 4.0–10.5)
nRBC: 0 % (ref 0.0–0.2)

## 2021-01-31 LAB — PHOSPHORUS: Phosphorus: 5.8 mg/dL — ABNORMAL HIGH (ref 2.5–4.6)

## 2021-01-31 LAB — ECHOCARDIOGRAM COMPLETE
Area-P 1/2: 4.06 cm2
Height: 71 in
MV M vel: 3.84 m/s
MV Peak grad: 59 mmHg
P 1/2 time: 313 msec
Radius: 0.4 cm
S' Lateral: 4.9 cm
Weight: 2721.6 oz

## 2021-01-31 LAB — COMPREHENSIVE METABOLIC PANEL
ALT: 142 U/L — ABNORMAL HIGH (ref 0–44)
AST: 95 U/L — ABNORMAL HIGH (ref 15–41)
Albumin: 3.2 g/dL — ABNORMAL LOW (ref 3.5–5.0)
Alkaline Phosphatase: 164 U/L — ABNORMAL HIGH (ref 38–126)
Anion gap: 20 — ABNORMAL HIGH (ref 5–15)
BUN: 73 mg/dL — ABNORMAL HIGH (ref 6–20)
CO2: 20 mmol/L — ABNORMAL LOW (ref 22–32)
Calcium: 7.7 mg/dL — ABNORMAL LOW (ref 8.9–10.3)
Chloride: 95 mmol/L — ABNORMAL LOW (ref 98–111)
Creatinine, Ser: 8.36 mg/dL — ABNORMAL HIGH (ref 0.61–1.24)
GFR, Estimated: 7 mL/min — ABNORMAL LOW (ref >60)
Glucose, Bld: 92 mg/dL (ref 70–99)
Potassium: 5.1 mmol/L (ref 3.5–5.1)
Sodium: 135 mmol/L (ref 135–145)
Total Bilirubin: 1.1 mg/dL (ref 0.3–1.2)
Total Protein: 6.4 g/dL — ABNORMAL LOW (ref 6.5–8.1)

## 2021-01-31 LAB — MAGNESIUM: Magnesium: 2.4 mg/dL (ref 1.7–2.4)

## 2021-01-31 LAB — D-DIMER, QUANTITATIVE: D-Dimer, Quant: 1.2 ug/mL-FEU — ABNORMAL HIGH (ref 0.00–0.50)

## 2021-01-31 LAB — PROTIME-INR
INR: 1.3 — ABNORMAL HIGH (ref 0.8–1.2)
Prothrombin Time: 15.8 seconds — ABNORMAL HIGH (ref 11.4–15.2)

## 2021-01-31 LAB — RENAL FUNCTION PANEL
Albumin: 3.2 g/dL — ABNORMAL LOW (ref 3.5–5.0)
Anion gap: 26 — ABNORMAL HIGH (ref 5–15)
BUN: 85 mg/dL — ABNORMAL HIGH (ref 6–20)
CO2: 15 mmol/L — ABNORMAL LOW (ref 22–32)
Calcium: 7.9 mg/dL — ABNORMAL LOW (ref 8.9–10.3)
Chloride: 95 mmol/L — ABNORMAL LOW (ref 98–111)
Creatinine, Ser: 9.55 mg/dL — ABNORMAL HIGH (ref 0.61–1.24)
GFR, Estimated: 6 mL/min — ABNORMAL LOW (ref >60)
Glucose, Bld: 70 mg/dL (ref 70–99)
Phosphorus: 7.9 mg/dL — ABNORMAL HIGH (ref 2.5–4.6)
Potassium: 5.5 mmol/L — ABNORMAL HIGH (ref 3.5–5.1)
Sodium: 136 mmol/L (ref 135–145)

## 2021-01-31 LAB — HEPARIN LEVEL (UNFRACTIONATED): Heparin Unfractionated: 0.1 IU/mL — ABNORMAL LOW (ref 0.30–0.70)

## 2021-01-31 LAB — FERRITIN: Ferritin: >7500 ng/mL — ABNORMAL HIGH (ref 24–336)

## 2021-01-31 LAB — C-REACTIVE PROTEIN: CRP: 1 mg/dL — ABNORMAL HIGH (ref <1.0)

## 2021-01-31 LAB — APTT: aPTT: 49 seconds — ABNORMAL HIGH (ref 24–36)

## 2021-01-31 SURGERY — LEFT HEART CATH AND CORONARY ANGIOGRAPHY
Anesthesia: LOCAL

## 2021-01-31 MED ORDER — SODIUM CHLORIDE 0.9% FLUSH
3.0000 mL | Freq: Two times a day (BID) | INTRAVENOUS | Status: DC
  Administered 2021-01-31 (×2): 3 mL via INTRAVENOUS

## 2021-01-31 MED ORDER — LIDOCAINE HCL (PF) 1 % IJ SOLN: 5.0000 mL | INTRAMUSCULAR | Status: DC | PRN

## 2021-01-31 MED ORDER — SODIUM CHLORIDE 0.9% FLUSH: 3.0000 mL | INTRAVENOUS | Status: DC | PRN

## 2021-01-31 MED ORDER — ALTEPLASE 2 MG IJ SOLR: 2.0000 mg | Freq: Once | INTRAMUSCULAR | Status: DC | PRN

## 2021-01-31 MED ORDER — HEPARIN (PORCINE) 25000 UT/250ML-% IV SOLN
950.0000 [IU]/h | INTRAVENOUS | Status: DC
  Administered 2021-01-31 – 2021-02-01 (×2): 950 [IU]/h via INTRAVENOUS
  Filled 2021-01-31: qty 250

## 2021-01-31 MED ORDER — PENTAFLUOROPROP-TETRAFLUOROETH EX AERO: 1.0000 "application " | INHALATION_SPRAY | CUTANEOUS | Status: DC | PRN

## 2021-01-31 MED ORDER — LIDOCAINE HCL (PF) 1 % IJ SOLN
INTRAMUSCULAR | Status: DC | PRN
  Administered 2021-01-31: 30 mL

## 2021-01-31 MED ORDER — DARBEPOETIN ALFA 40 MCG/0.4ML IJ SOSY
40.0000 ug | PREFILLED_SYRINGE | INTRAMUSCULAR | Status: DC
  Filled 2021-01-31: qty 0.4

## 2021-01-31 MED ORDER — LIDOCAINE-PRILOCAINE 2.5-2.5 % EX CREA: 1.0000 "application " | TOPICAL_CREAM | CUTANEOUS | Status: DC | PRN

## 2021-01-31 MED ORDER — HEPARIN SODIUM (PORCINE) 1000 UNIT/ML DIALYSIS: 1000.0000 [IU] | INTRAMUSCULAR | Status: DC | PRN

## 2021-01-31 MED ORDER — ASPIRIN 81 MG PO CHEW
81.0000 mg | CHEWABLE_TABLET | Freq: Once | ORAL | Status: AC
  Administered 2021-01-31: 81 mg via ORAL
  Filled 2021-01-31: qty 1

## 2021-01-31 MED ORDER — HEPARIN (PORCINE) IN NACL 1000-0.9 UT/500ML-% IV SOLN
INTRAVENOUS | Status: DC | PRN
  Administered 2021-01-31: 500 mL

## 2021-01-31 MED ORDER — CHLORHEXIDINE GLUCONATE CLOTH 2 % EX PADS: 6.0000 | MEDICATED_PAD | Freq: Every day | CUTANEOUS | Status: DC

## 2021-01-31 MED ORDER — IOHEXOL 350 MG/ML SOLN
INTRAVENOUS | Status: DC | PRN
  Administered 2021-01-31: 105 mL

## 2021-01-31 MED ORDER — SODIUM CHLORIDE 0.9 % IV SOLN: 250.0000 mL | INTRAVENOUS | Status: DC | PRN

## 2021-01-31 MED ORDER — SODIUM CHLORIDE 0.9 % IV SOLN: 100.0000 mL | INTRAVENOUS | Status: DC | PRN

## 2021-01-31 MED ORDER — SODIUM ZIRCONIUM CYCLOSILICATE 10 G PO PACK
10.0000 g | PACK | Freq: Once | ORAL | Status: AC
  Administered 2021-01-31: 10 g via ORAL
  Filled 2021-01-31: qty 1

## 2021-01-31 MED ORDER — DOXERCALCIFEROL 4 MCG/2ML IV SOLN
6.0000 ug | INTRAVENOUS | Status: DC
  Filled 2021-01-31: qty 4

## 2021-01-31 SURGICAL SUPPLY — 13 items
CATH INFINITI 5 FR AL2 (CATHETERS) ×1 IMPLANT
CATH INFINITI 5FR AL1 (CATHETERS) ×1 IMPLANT
CATH INFINITI 5FR MULTPACK ANG (CATHETERS) IMPLANT
DEVICE CLOSURE MYNXGRIP 6/7F (Vascular Products) ×1 IMPLANT
KIT ENCORE 26 ADVANTAGE (KITS) IMPLANT
KIT HEART LEFT (KITS) ×2 IMPLANT
KIT MICROPUNCTURE NIT STIFF (SHEATH) ×1 IMPLANT
PACK CARDIAC CATHETERIZATION (CUSTOM PROCEDURE TRAY) ×2 IMPLANT
SHEATH PINNACLE 6F 10CM (SHEATH) ×1 IMPLANT
SHEATH PROBE COVER 6X72 (BAG) ×1 IMPLANT
TRANSDUCER W/STOPCOCK (MISCELLANEOUS) ×2 IMPLANT
TUBING CIL FLEX 10 FLL-RA (TUBING) ×3 IMPLANT
WIRE EMERALD 3MM-J .035X150CM (WIRE) ×1 IMPLANT

## 2021-01-31 NOTE — Consult Note (Signed)
[facility] Renal Consultation Note    Indication for Consultation:  Management of ESRD/hemodialysis; anemia, hypertension/volume and secondary hyperparathyroidism  QIH:[id number], [place], MD  HPI: Travis [name] is a 60 y.o. male. 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  Past medical history significant for HTN, HLD, severe multivessel CAD with STEMI s/p DES to LAD, A fib w/RVR s/p cardioversion x2, PEA arrest requiring CPR, GERD, Hx GIB 2/2 esophagitis, b/l lung abscess with empyema s/p chest tube, and Hx RCC s/p R nephrectomy.  Patient presented to the ED one day ago, following dialysis, due to chest pain.  Reports CP/pressure started early yesterday AM and continued to worsen throughout dialysis so decided he should be seen.  Admits to intermittent chest pain over the weekend as well.  Denies SOB, abdominal pain, n/v/d, orthopnea, weakness, dizziness and fatigue  Reports dialysis has been going well.  Review of outpatient records show compliance with dialysis and occasional hypotension during treatments.    Cardiology was consulted in the ED. ECG showed mild ST depression inferiorly.  Was to have staged PCI in near future which had been postponed due to acute medical problems.  Heart cath completed this AM which showed severe heavily calcified 3 vessel CAD including fully occluded stent in LAD (placed 10/2020) and significant progression of distal RCA, proximal Cx and 1st diagonal disease.  Additional pertinent findings during work up include hypotension, COVID+, K 5.1, ferritin >7500, D dimer 1.2, CXR with vascular congestion, CTA with asymmetric pulmonary edema, sm R pleural effusion but significant improvement in lung aeration when compared to prior study.  Patient has been admitted for further evaluation and management.     Past Medical History:  Diagnosis Date  . Acute ST elevation myocardial infarction (STEMI) of anterolateral wall ([place]) 10/28/2020   Culprit lesion, 99% ulcerated  mid LAD at D1 -> DES PCI  . Anemia of chronic disease 01/30/2015  . Chronic HFrEF (heart failure with reduced ejection fraction) ([place]) 10/28/2020   LVEDP in Cath 30-35 mmHg --> EF 45-50% with Inseminate filling  . Coronary artery disease involving native coronary artery of native heart with unstable angina pectoris ([place]) 10/28/2020   Acute anterolateral STEMI: 3V CAD: Culprit Lesion = 99% mid LAD lesion (just after D1) - DES PCI; 99% ostial AV groove LCx; & 80% calcified napkin ring proximal RCA lesion with extensive calcification throughout the RCA. Successful PTCA and DES PCI of the LAD crossing D1 -resolute Onyx DES 2.75 mm x 18 mm postdilated to 3.1 mm. With PCI, there  . End-stage renal disease on hemodialysis ([place])   . GERD (gastroesophageal reflux disease)    takes Nexium  . Hyperlipidemia with target LDL less than 70 10/28/2020  . Hypertension   . Pneumonia    as a child  . Presence of drug coated stent in LAD coronary artery 10/28/2020   Proximal-mid LAD 99% ->0% -> Resolute Onyx DES 2.75 mm x 18 mm (3.1 mm)  . Sleep apnea    uses c-pap 2 yrs   Past Surgical History:  Procedure Laterality Date  . AV FISTULA PLACEMENT Left 12/22/2017   Procedure: REPAIR PSEUDOANEURYSM ARTERIOVENOUS (AV) FISTULA;  Surgeon: [name], MD;  Location: [place];  Service: Vascular;  Laterality: Left;  . BASCILIC VEIN TRANSPOSITION Left 01/05/2014   Procedure: LEFT 1ST STAGE BASCILIC VEIN TRANSPOSITION;  Surgeon: Conrad Frederick, MD;  Location: [place];  Service: Vascular;  Laterality: Left;  . BASCILIC VEIN TRANSPOSITION Left 07/26/2014  Procedure: Left Arm Brachial Vein Transposition Second Stage;  Surgeon: Conrad Richardton, MD;  Location: [place];  Service: Vascular;  Laterality: Left;  . COLONOSCOPY    . COLONOSCOPY N/A 02/02/2015   Procedure: COLONOSCOPY;  Surgeon: [name], MD;  Location: [facility] ENDOSCOPY;  Service: Endoscopy;  Laterality: N/A;  . CORONARY STENT INTERVENTION N/A 10/28/2020   Procedure:  CORONARY STENT INTERVENTION;  Surgeon: Leonie Man, MD;  Location: [place] CV LAB;  Successful PTCA and DES PCI of the LAD crossing D1 -resolute Onyx DES 2.75 mm x 18 mm postdilated to 3.1 mm. -- 99% TO 0%  . CORONARY/GRAFT ACUTE MI REVASCULARIZATION N/A 10/28/2020   Procedure: Coronary/Graft Acute MI Revascularization;  Surgeon: Leonie Man, MD;  Location: [place] CV LAB;  Service: Cardiovascular;  mid LAD 99% DES PCI  . ESOPHAGOGASTRODUODENOSCOPY Left 01/31/2015   Procedure: ESOPHAGOGASTRODUODENOSCOPY (EGD);  Surgeon: [name], MD;  Location: [facility] ENDOSCOPY;  Service: Endoscopy;  Laterality: Left;  . ESOPHAGOGASTRODUODENOSCOPY N/A 11/09/2020   Procedure: ESOPHAGOGASTRODUODENOSCOPY (EGD);  Surgeon: Clarene Essex, MD;  Location: [place];  Service: Endoscopy;  Laterality: N/A;  . EVALUATION UNDER ANESTHESIA WITH HEMORRHOIDECTOMY N/A 02/26/2018   Procedure: ANORECTAL EXAM UNDER ANESTHESIA  HEMORRHOIDECTOMY x 2  HEMORRHOIDAL LIGATION AND PEXY;  Surgeon: Mychal Boston, MD;  Location: [facility] ORS;  Service: General;  Laterality: N/A;  . GIVENS CAPSULE STUDY N/A 01/31/2015   Procedure: GIVENS CAPSULE STUDY;  Surgeon: [name], MD;  Location: [facility] ENDOSCOPY;  Service: Endoscopy;  Laterality: N/A;  . IR AV DIALY SHUNT INTRO NEEDLE/INTRAC INITIAL W/PTA/STENT/IMG LT Left 11/20/2020  . IR US GUIDE VASC ACCESS LEFT  11/20/2020  . LEFT HEART CATH AND CORONARY ANGIOGRAPHY N/A 10/28/2020   Procedure: LEFT HEART CATH AND CORONARY ANGIOGRAPHY;  Surgeon: Leonie Man, MD;  Location: [place] CV LAB;  Service: Cardiovascular; - Acute Ant-Lat STEMI-> MV CAD: Culprit = 99% mLAD (just after major D1) => DES PCI; 99% ostial AVG LCx (poor PCI target), 80% prox RCA napkin ring lesion.  EF ~40-45% (anterior - ant-lat HK). LVEDP 30 mmHg. (Used AL1 Guide for both LCA & RCA).  . LEFT HEART CATH AND CORONARY ANGIOGRAPHY N/A 01/25/2021   Procedure: LEFT HEART CATH AND CORONARY ANGIOGRAPHY;  Surgeon:  Wellington Hampshire, MD;  Location: [place] CV LAB;  Service: Cardiovascular;  Laterality: N/A;  . MOUTH SURGERY     teeth cleaning  . NEPHRECTOMY RADICAL Right 05/2015   [facility] Dr [name] for R renal mass  . TRANSESOPHAGEAL ECHOCARDIOGRAM  11/01/2020   EF 35-40% moderate decreased function. Severe LA dilation. No LAA thrombus. Moderate RA dilation. 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 Mild dilation of ascending aorta measuring 40 mm. Moderate grade 3 plaque..  . TRANSTHORACIC ECHOCARDIOGRAM  10/31/2020   -POST ANT STEMI (& CARDIAC ARREST) -  EF 35 to 40%. Moderate decreased function. Moderate concentric LVH GR 3 DD. Severe HK of the mid apical inferolateral and inferior wall with moderate HK of the apical septal and anterior wall. PA pressure estimated 43 mmHg. Severe LA dilation. Moderate RA dilation.Moderate to severe MR.Aortic root dilated at 41 mm. RV PSP 15 mmHg.  Marland Kitchen TRANSTHORACIC ECHOCARDIOGRAM  11/26/2020   EF 45 to 50% with mildly decreased function - mid inferoseptal, apical septal & apical wall hypokinesis. . Moderate concentric LVH and indeterminate filling. Mild to moderate MR due to restricted PMV L with  mitral annular calcification III B.   Family History  Problem Relation Age of Onset  . Hypertension Mother   . Hypertension Father   . Hypertension Sister    Social History:  reports that he quit smoking about 8 years ago. His smoking use included cigars. He quit after 4.00 years of use. He has never used smokeless tobacco. He reports that he does not drink alcohol and does not use drugs. Allergies  Allergen Reactions  . Lisinopril Swelling    PT states he is allergic to all prils; caused facial swelling  . Ibuprofen Hives  . Naproxen Hives and Other (See Comments)    Alleve causes patient to have hives   Prior to  Admission medications   Medication Sig Start Date End Date Taking? Authorizing Provider  acetaminophen (TYLENOL) 325 MG tablet Take 650 mg by mouth every 6 (six) hours as needed for mild pain, fever or headache.   Yes [provider]  amiodarone (PACERONE) 200 MG tablet Take 1 tablet (200 mg total) by mouth daily. 01/13/21 03/14/21 Yes Strader, Fransisco Hertz, PA-C  atorvastatin (LIPITOR) 80 MG tablet Take 1 tablet (80 mg total) by mouth daily at 6 PM. 01/13/21 02/12/21 Yes Strader, Fransisco Hertz, PA-C  AURYXIA 1 GM 210 MG(Fe) tablet Take 210-630 mg by mouth See admin instructions. 630mg  three times daily with meals and 210-420mg  with snacks 10/03/17  Yes [provider]  cinacalcet (SENSIPAR) 60 MG tablet Take 120 mg by mouth daily. 10/14/20  Yes [provider]  heparin sodium, porcine, 1000 UNIT/ML injection Heparin (Porcine) Preserv Free 1,000 Units/mL Systemic 12/19/20 12/18/21 Yes [provider]  metoprolol tartrate (LOPRESSOR) 25 MG tablet Take 1 tablet (25 mg total) by mouth 2 (two) times daily. 01/24/21 02/23/21 Yes Leonie Man, MD  midodrine (PROAMATINE) 5 MG tablet Take 5 mg by mouth 3 (three) times daily with meals. On dialysis days take 10mg  before as the first dose, and then 5mg  for the second and third dose of the day. 12/14/20  Yes [provider]  multivitamin (RENA-VIT) TABS tablet Take 1 tablet by mouth daily. 10/13/20  Yes [provider]  multivitamin-iron-minerals-folic acid (CENTRUM) chewable tablet Chew 1 tablet by mouth daily.   Yes [provider]  pantoprazole (PROTONIX) 40 MG tablet Take 1 tablet (40 mg total) by mouth 2 (two) times daily. Patient taking differently: Take 40 mg by mouth daily. 12/14/20 01/13/21 Yes Darliss Cheney, MD  ticagrelor (BRILINTA) 90 MG TABS tablet Take 1 tablet (90 mg total) by mouth 2 (two) times daily. 01/13/21 03/14/21 Yes Strader, Fransisco Hertz, PA-C   Current Facility-Administered Medications  Medication  Dose Route Frequency Provider Last Rate Last Admin  . 0.9 %  sodium chloride infusion  250 mL Intravenous PRN Kathlyn Sacramento A, MD      . acetaminophen (TYLENOL) tablet 650 mg  650 mg Oral Q4H PRN Kathlyn Sacramento A, MD      . albuterol (VENTOLIN HFA) 108 (90 Base) MCG/ACT inhaler 2 puff  2 puff Inhalation Q6H Wellington Hampshire, MD   2 puff at 01/12/2021 0735  . amiodarone (PACERONE) tablet 200 mg  200 mg Oral Daily Arida, Muhammad A, MD      . ascorbic acid (VITAMIN C) tablet 500 mg  500 mg Oral Daily Kathlyn Sacramento A, MD   500 mg at 02/05/2021 1155  . atorvastatin (LIPITOR) tablet 80 mg  80 mg Oral q1800 Kathlyn Sacramento A, MD   80 mg at 02/02/2021 1700  .  cinacalcet (SENSIPAR) tablet 120 mg  120 mg Oral Daily Kathlyn Sacramento A, MD      . fentaNYL (SUBLIMAZE) injection 12.5 mcg  12.5 mcg Intravenous Q2H PRN Wellington Hampshire, MD       Or  . fentaNYL (SUBLIMAZE) injection 25 mcg  25 mcg Intravenous Q2H PRN Wellington Hampshire, MD      . guaiFENesin-dextromethorphan (ROBITUSSIN DM) 100-10 MG/5ML syrup 10 mL  10 mL Oral Q4H PRN Kathlyn Sacramento A, MD      . heparin ADULT infusion 100 units/mL (25000 units/269mL)  950 Units/hr Intravenous Continuous Yates, Madison B, RPH      . melatonin tablet 3 mg  3 mg Oral QHS Wellington Hampshire, MD   3 mg at 01/21/2021 0028  . metoprolol tartrate (LOPRESSOR) tablet 25 mg  25 mg Oral BID Wellington Hampshire, MD      . Derrill Memo ON February 22, 2021] midodrine (PROAMATINE) tablet 10 mg  10 mg Oral Once per day on Tue Thu Sat Kathlyn Sacramento A, MD      . midodrine (PROAMATINE) tablet 5 mg  5 mg Oral 3 times per day on Sun Mon Wed Fri Wellington Hampshire, MD   5 mg at 01/26/2021 1155  . midodrine (PROAMATINE) tablet 5 mg  5 mg Oral 2 times per day on Tue Thu Sat Kathlyn Sacramento A, MD   5 mg at 01/25/2021 1701  . ondansetron (ZOFRAN) injection 4 mg  4 mg Intravenous Q6H PRN Kathlyn Sacramento A, MD      . pantoprazole (PROTONIX) injection 40 mg  40 mg Intravenous Q12H Wellington Hampshire, MD   40 mg  at 01/10/2021 1227  . sodium chloride flush (NS) 0.9 % injection 3 mL  3 mL Intravenous Q12H Wellington Hampshire, MD   3 mL at 01/29/2021 2131  . sodium chloride flush (NS) 0.9 % injection 3 mL  3 mL Intravenous Q12H Kathlyn Sacramento A, MD   3 mL at 01/28/2021 1157  . sodium chloride flush (NS) 0.9 % injection 3 mL  3 mL Intravenous PRN Kathlyn Sacramento A, MD      . zinc sulfate capsule 220 mg  220 mg Oral Daily Wellington Hampshire, MD       Labs: Basic Metabolic Panel: Recent Labs  Lab 01/24/2021 1027 02/04/2021 0233  NA 139 135  K 3.7 5.1  CL 95* 95*  CO2 30 20*  GLUCOSE 104* 92  BUN 51* 73*  CREATININE 6.39* 8.36*  CALCIUM 8.4* 7.7*  PHOS  --  5.8*   Liver Function Tests: Recent Labs  Lab 01/30/2021 0233  AST 95*  ALT 142*  ALKPHOS 164*  BILITOT 1.1  PROT 6.4*  ALBUMIN 3.2*   CBC: Recent Labs  Lab 02/04/2021 1027 01/14/2021 0233  WBC 9.6 9.6  NEUTROABS  --  7.2  HGB 10.6* 10.8*  HCT 35.6* 35.2*  MCV 92.5 92.1  PLT 198 195   Iron Studies:  Recent Labs    01/31/21 0233  FERRITIN >7,500*   Studies/Results: DG Chest 2 View  Result Date: 01/29/2021 CLINICAL DATA:  Chest pain EXAM: CHEST - 2 VIEW COMPARISON:  12/08/2020 FINDINGS: Cardiac enlargement. Mild vascular congestion without edema. Small right pleural effusion unchanged with associated mild right lower lobe and right middle lobe airspace disease. Left lung base clear. No left effusion. IMPRESSION: Cardiac enlargement with vascular congestion suggesting mild fluid overload Small right effusion and mild right lower lobe atelectasis/infiltrate. Electronically Signed   By: Juanda Crumble  Carlis Abbott M.D.   On: 01/27/2021 10:52   CARDIAC CATHETERIZATION  Result Date: 01/17/2021  1st Diag lesion is 90% stenosed.  Balloon angioplasty was performed.  LPAV lesion is 99% stenosed.  Prox RCA lesion is 80% stenosed.  Dist RCA lesion is 90% stenosed.  RPDA lesion is 70% stenosed.  Ost Cx to Prox Cx lesion is 80% stenosed.  Mid LAD lesion is  100% stenosed.  Prox LAD lesion is 40% stenosed.  1.  Severe heavily calcified three-vessel coronary artery disease.  The LAD stent which was placed in November is occluded.  In addition, there is significant progression of distal RCA disease as well as proximal left circumflex and first diagonal disease. 2.  Mildly elevated left ventricular end-diastolic pressure at 22 mmHg.  Left ventricular angiography was not performed. Recommendations: The coronary arteries are not favorable for PCI. Recommend evaluation for CABG. I ordered an echocardiogram. I discontinued Brilinta. Resume heparin drip 8 hours after sheath pull.   CT Angio Chest/Abd/Pel for Dissection W and/or W/WO  Result Date: 01/14/2021 CLINICAL DATA:  60 year old male with left-sided chest pain and concern for thoracic aortic aneurysm. EXAM: CT ANGIOGRAPHY CHEST, ABDOMEN AND PELVIS TECHNIQUE: Non-contrast CT of the chest was initially obtained. Multidetector CT imaging through the chest, abdomen and pelvis was performed using the standard protocol during bolus administration of intravenous contrast. Multiplanar reconstructed images and MIPs were obtained and reviewed to evaluate the vascular anatomy. CONTRAST:  161mL OMNIPAQUE IOHEXOL 350 MG/ML SOLN COMPARISON:  Chest CT dated 11/26/2020. FINDINGS: CTA CHEST FINDINGS Cardiovascular: There is mild to moderate cardiomegaly, new since the prior CT. No pericardial effusion. Three-vessel coronary vascular calcification. There is retrograde flow of contrast from the right atrium into the IVC suggestive of right heart dysfunction. There is mild atherosclerotic calcification of the thoracic aorta. No aneurysmal dilatation or dissection. The ascending aorta measures up to 3.9 cm in diameter. The origins of the great vessels of the aortic arch appear patent. The central pulmonary arteries are patent and appear unremarkable for the degree of opacification. Mediastinum/Nodes: Mildly enlarged right hilar lymph  nodes measuring 13 mm. Anterior paratracheal lymph node measures 15 mm. The esophagus is grossly unremarkable. A 9 mm calcified nodule from the left posterior thyroid appears similar to prior CT. No mediastinal fluid collection. Lungs/Pleura: Bilateral patchy and confluent ground-glass opacities, right greater left. There is diffuse interstitial and interlobular septal prominence. Small right pleural effusion noted. Findings consistent with asymmetric pulmonary edema and possible superimposed pneumonia. Clinical correlation is recommended. Significant improvement in aeration of the lungs compared to prior CT. A 2.5 x 2.0 cm masslike subpleural consolidation in the right middle lobe has decreased since the prior CT. The central airways are patent. Musculoskeletal: There is diffuse osteosclerosis consistent with renal osteodystrophy. No acute osseous pathology. Review of the MIP images confirms the above findings. CTA ABDOMEN AND PELVIS FINDINGS VASCULAR Aorta: Moderate atherosclerotic calcification. No aneurysmal dilatation or dissection. No periaortic fluid collection. Celiac: Patent without evidence of aneurysm, dissection, vasculitis or significant stenosis. SMA: Patent without evidence of aneurysm, dissection, vasculitis or significant stenosis. Renals: Atherosclerotic calcification of the origin of the renal arteries. The left renal artery appears patent but with significantly diminished flow distally. Status post prior right nephrectomy. IMA: Patent without evidence of aneurysm, dissection, vasculitis or significant stenosis. Inflow: Atherosclerotic calcification of the iliac arteries. The iliac arteries remain patent. Veins: No obvious venous abnormality within the limitations of this arterial phase study. Review of the MIP images confirms the above findings.  NON-VASCULAR No intra-abdominal free air.  Small ascites. Hepatobiliary: The liver is enlarged measuring approximately 18 cm in midclavicular length. No  intrahepatic biliary ductal dilatation. Gallbladder sludge versus vicarious excretion of contrast. Pancreas: Unremarkable. No pancreatic ductal dilatation or surrounding inflammatory changes. Spleen: Normal in size without focal abnormality. Adrenals/Urinary Tract: The adrenal glands unremarkable. Status post prior right nephrectomy. The left kidney is atrophic. Multiple small left renal hypodense lesions are not characterized on this CT. There is no hydronephrosis or nephrolithiasis. The left ureter is unremarkable. The urinary bladder is collapsed. Stomach/Bowel: Scattered colonic diverticula without active inflammatory changes. There is no bowel obstruction or active inflammation. The appendix is normal. Lymphatic: No adenopathy. Reproductive: The prostate and seminal vesicles are grossly unremarkable. No pelvic mass. Other: Mild diffuse subcutaneous edema.  No fluid collection. Musculoskeletal: Diffuse osteosclerosis in keeping with renal osteodystrophy. No acute osseous pathology. Review of the MIP images confirms the above findings. IMPRESSION: 1. No aortic dissection or aneurysm. 2. Cardiomegaly with findings of asymmetric pulmonary edema and possible superimposed pneumonia. Clinical correlation is recommended. 3. Small right pleural effusion. 4. Significant improvement in aeration of the lungs compared to the prior CT. Interval decrease in the size of the right middle lobe subpleural masslike consolidation. Attention on follow-up imaging recommended. 5. Chronic kidney disease and prior right nephrectomy. Renal osteodystrophy. 6. Colonic diverticulosis. No bowel obstruction. Normal appendix. 7. Aortic Atherosclerosis (ICD10-I70.0). Electronically Signed   By: Anner Crete M.D.   On: 01/12/2021 16:21    ROS: All others negative except those listed in HPI.  Physical Exam: Vitals:   02/02/2021 0956 02/04/2021 1001 01/13/2021 1005 01/29/2021 1027  BP: 107/71 100/66  91/64  Pulse: 73 73 75 73  Resp: (!) 23  (!) 24 14   Temp:    97.8 F (36.6 C)  TempSrc:    Oral  SpO2: 99% 98% 97% 100%  Weight:      Height:         General: WDWN male in NAD Head: NCAT sclera not icteric MMM Neck: Supple. No JVD. Lungs: mostly CTAB. Breathing is unlabored on RA.   Heart: RRR. No murmur, rubs or gallops appreciated.   Abdomen: soft, nontender, +BS, no guarding, no rebound tenderness  Lower extremities:no edema, ischemic changes, or open wounds  Neuro: AAOx3. Moves all extremities spontaneously. Psych:  Responds to questions appropriately with a normal affect. Dialysis Access: LU AVF +b  Dialysis Orders:  TTS - [facility]  4hrs, BFR 450, DFR 500,  EDW 75kg, 2K/ 2.5Ca  Access: LU AVF  Heparin 5000 initial bolus, 1000 intermittent mid run Mircera 75 mcg q2wks - last 01/18/21 Venofer 100mg  qHD x5 (3 completed) Hectorol 55mcg IV qHD    Assessment/Plan: 1.  NSTEMI - Hx Severe multivessel CAD - CP resolved. heart cath today showed occluded stent and worsened disease.  Poor candidate for PCI per cardiology. ECHO ordered. To have CTS consult for possible CABG.   2. COVID + - incidental finding. Asymptomatic.  Per primary. 3.  ESRD -  On HD MWF.  K 5.1, will order lokelma. Orders written for HD tomorrow per regular schedule.  4.  Hypertension/volume  - BP soft, on midodrine for BP support with HD.  CXR and CTA show pulmonary edema.  Titrate down volume as tolerated.  5.  Anemia of CKD - Hgb 10.8.  ESA due tomorrow, will order.  Hold venofer with elevated ferritin.  6.  Secondary Hyperparathyroidism -  CCa a little low, use increased Ca bath. Continue  VDRA and sensipar.  Phos close to goal, hold auryxia for now due to elevated ferritin.  May need to start alternate binder if phos starts to increase.  7.  Nutrition - Renal diet with fluid restrictions.  8. A fib - on amiodarone and metoprolol, hold prior to HD 9. GERD - on protonix.   Jen Mow, PA-C [employer] 01/31/2021, 12:37 PM

## 2021-01-31 NOTE — Interval H&P Note (Signed)
Cath Lab Visit (complete for each Cath Lab visit)  Clinical Evaluation Leading to the Procedure:   ACS: Yes.    Non-ACS:    N/a     History and Physical Interval Note:  01/25/2021 9:13 AM  Travis [name]  has presented today for surgery, with the diagnosis of chest pain.  The various methods of treatment have been discussed with the patient and family. After consideration of risks, benefits and other options for treatment, the patient has consented to  Procedure(s): LEFT HEART CATH AND CORONARY ANGIOGRAPHY (N/A) CORONARY STENT INTERVENTION (N/A) as a surgical intervention.  The patient's history has been reviewed, patient examined, no change in status, stable for surgery.  I have reviewed the patient's chart and labs.  Questions were answered to the patient's satisfaction.     Kathlyn Sacramento

## 2021-01-31 NOTE — Progress Notes (Signed)
TCTS consulted for CABG evaluation. °

## 2021-01-31 NOTE — Progress Notes (Signed)
PROGRESS NOTE    Travis [name]  VFI:[id number] DOB: [birth year] DOA: 02/05/2021 PCP: Lujean Amel, MD   Brief Narrative:  60 year old male with past medical history of end-stage renal disease (Tues, thurs Sat), coronary artery disease (S/P STEMI S/P DES to LAD 10/28/2020), hypertension, atrial fibrillation with rapid ventricular response (S/P cardioversion x 2), PEA arrest, GERD with esophagitis(EGD 11/09/2020) who presents to [facility] hospital emergency department with complaints of malaise and chest discomfort.  Of note, patient underwent a prolonged hospitalization at [facility] from 10/28/2020 until 12/14/2020.  Patient initially presented as a STEMI requiring emergent catheterization with DES to LAD on 11/20.  Patient underwent prolonged hospitalization suffering acute hypoxic respiratory failure requiring mechanical ventilation.  Hospital course was complicated by atrial fibrillation with rapid jugular Swan status post cardioversion x2, stage IV sacral wound requiring wound VAC placement, PEA arrest, upper gastrointestinal bleeding with EGD performed 11/09/2020 finding esophagitis and ischemic areas over the duodenal bulb as well as a complicated pneumonia with bilateral lung abscesses and right pleural effusion/empyema the chest tube insertion as well as MSSA bacteremia.  Patient was eventually discharged on 12/14/2020.  Patient explains that this past Friday on 2/18 while the patient was participating with physical therapy during a particularly recovery session he began to experience chest discomfort - worsen with exertion and improved with rest.  Patient denies any associated shortness of breath, cough, fevers or palpitations. Upon evaluation in the emergency department, initial troponin was found to be 1940.  No evidence of recurrent STEMI was found on EKG. Hospitalist called to admit.  Assessment & Plan:   Principal Problem:   NSTEMI (non-ST elevated myocardial infarction)  ([place]) Active Problems:   ESRD (end stage renal disease) on dialysis [facility])   Essential hypertension   Chronic combined systolic and diastolic CHF (congestive heart failure) (HCC)   PAF (paroxysmal atrial fibrillation) (HCC)   GERD with esophagitis   Stage IV pressure ulcer of sacral region ([place])   COVID-19 virus infection    NSTEMI (non-ST elevated myocardial infarction) [facility]) -Cardiology following -Status post cath this morning, being evaluated by cardiac surgery for bypass discussion - Continuing Brilinta, statin therapy per cardiology - Of note, patient is allergic to aspirin  Chronic combined systolic and diastolic CHF (congestive heart failure) (HCC) - No significant clinical evidence of cardiogenic volume overload in this patient at this time  ESRD (end stage renal disease) on dialysis [facility]) - Nephrology following  PAF (paroxysmal atrial fibrillation) ([place]) - Currently normal sinus rhythm and rate controlled -Continue amiodarone  GERD with esophagitis Continue Protonix  Stage IV pressure ulcer of sacral region [facility]) Present on admission Wound VAC in place Wound care consult placed, recommendations are appreciated   DVT prophylaxis: Heparin drip Code Status: Full Family Communication: None present  Status is: Inpatient  Dispo: The patient is from: Home              Anticipated d/c is to: To be determined              Anticipated d/c date is: > 72 hours              Patient currently not medically stable for discharge  Consultants:   Cardiology  Procedures:   Cardiac cath 01/14/2021  Antimicrobials:  None  Subjective: No acute issues or events overnight denies nausea vomiting diarrhea constipation headache fevers or chills  Objective: Vitals:   01/21/2021 2015 01/17/2021 2130 02/02/2021 0507 01/29/2021 0812  BP: 90/61 (!) 87/67 95/65  Pulse: 75 72 78   Resp: 17  19   Temp: 98.3 F (36.8 C)  98.3 F (36.8 C)   TempSrc: Oral  Oral   SpO2: 97%  100%  99%  Weight:   77.2 kg   Height:        Intake/Output Summary (Last 24 hours) at 02/03/2021 0826 Last data filed at 01/09/2021 2030 Gross per 24 hour  Intake 432.36 ml  Output --  Net 432.36 ml   Filed Weights   01/19/2021 1341 01/24/2021 0507  Weight: 77.1 kg 77.2 kg    Examination:  General exam: Appears calm and comfortable  Respiratory system: Clear to auscultation. Respiratory effort normal. Cardiovascular system: S1 & S2 heard, RRR. No JVD, murmurs, rubs, gallops or clicks. No pedal edema. Gastrointestinal system: Abdomen is nondistended, soft and nontender. No organomegaly or masses felt. Normal bowel sounds heard. Central nervous system: Alert and oriented. No focal neurological deficits. Extremities: Symmetric 5 x 5 power. Skin: No rashes, lesions or ulcers Psychiatry: Judgement and insight appear normal. Mood & affect appropriate.     Data Reviewed: I have personally reviewed following labs and imaging studies  CBC: Recent Labs  Lab 02/04/2021 1027 01/24/2021 0233  WBC 9.6 9.6  NEUTROABS  --  7.2  HGB 10.6* 10.8*  HCT 35.6* 35.2*  MCV 92.5 92.1  PLT 198 188   Basic Metabolic Panel: Recent Labs  Lab 01/27/2021 1027 01/21/2021 0233  NA 139 135  K 3.7 5.1  CL 95* 95*  CO2 30 20*  GLUCOSE 104* 92  BUN 51* 73*  CREATININE 6.39* 8.36*  CALCIUM 8.4* 7.7*  MG  --  2.4  PHOS  --  5.8*   GFR: Estimated Creatinine Clearance: 10.3 mL/min (A) (by C-G formula based on SCr of 8.36 mg/dL (H)). Liver Function Tests: Recent Labs  Lab 02/02/2021 0233  AST 95*  ALT 142*  ALKPHOS 164*  BILITOT 1.1  PROT 6.4*  ALBUMIN 3.2*   No results for input(s): LIPASE, AMYLASE in the last 168 hours. No results for input(s): AMMONIA in the last 168 hours. Coagulation Profile: Recent Labs  Lab 02/03/2021 0233  INR 1.3*   Cardiac Enzymes: No results for input(s): CKTOTAL, CKMB, CKMBINDEX, TROPONINI in the last 168 hours. BNP (last 3 results) No results for input(s): PROBNP in  the last 8760 hours. HbA1C: No results for input(s): HGBA1C in the last 72 hours. CBG: No results for input(s): GLUCAP in the last 168 hours. Lipid Profile: No results for input(s): CHOL, HDL, LDLCALC, TRIG, CHOLHDL, LDLDIRECT in the last 72 hours. Thyroid Function Tests: No results for input(s): TSH, T4TOTAL, FREET4, T3FREE, THYROIDAB in the last 72 hours. Anemia Panel: Recent Labs    01/30/2021 0233  FERRITIN >7,500*   Sepsis Labs: Recent Labs  Lab 01/31/2021 1656  PROCALCITON 0.90    Recent Results (from the past 240 hour(s))  Resp Panel by RT-PCR (Flu A&B, Covid) Nasopharyngeal Swab     Status: Abnormal   Collection Time: 01/27/2021  2:04 PM   Specimen: Nasopharyngeal Swab; Nasopharyngeal(NP) swabs in vial transport medium  Result Value Ref Range Status   SARS Coronavirus 2 by RT PCR POSITIVE (A) NEGATIVE Final    Comment: RESULT CALLED TO, READ BACK BY AND VERIFIED WITHThana Farr RN [id number] 01/29/21 A BROWNING (NOTE) SARS-CoV-2 target nucleic acids are DETECTED.  The SARS-CoV-2 RNA is generally detectable in upper respiratory specimens during the acute phase of infection. Positive results are indicative of the presence of the  identified virus, but do not rule out bacterial infection or co-infection with other pathogens not detected by the test. Clinical correlation with patient history and other diagnostic information is necessary to determine patient infection status. The expected result is Negative.  Fact Sheet for Patients: EntrepreneurPulse.com.au  Fact Sheet for Healthcare Providers: IncredibleEmployment.be  This test is not yet approved or cleared by the [place] FDA and  has been authorized for detection and/or diagnosis of SARS-CoV-2 by FDA under an Emergency Use Authorization (EUA).  This EUA will remain in effect (meaning this test can  be used) for the duration of  the COVID-19 declaration under Section 564(b)(1) of the  Act, 21 U.S.C. section 360bbb-3(b)(1), unless the authorization is terminated or revoked sooner.     Influenza A by PCR NEGATIVE NEGATIVE Final   Influenza B by PCR NEGATIVE NEGATIVE Final    Comment: (NOTE) The Xpert Xpress SARS-CoV-2/FLU/RSV plus assay is intended as an aid in the diagnosis of influenza from Nasopharyngeal swab specimens and should not be used as a sole basis for treatment. Nasal washings and aspirates are unacceptable for Xpert Xpress SARS-CoV-2/FLU/RSV testing.  Fact Sheet for Patients: EntrepreneurPulse.com.au  Fact Sheet for Healthcare Providers: IncredibleEmployment.be  This test is not yet approved or cleared by the [place] FDA and has been authorized for detection and/or diagnosis of SARS-CoV-2 by FDA under an Emergency Use Authorization (EUA). This EUA will remain in effect (meaning this test can be used) for the duration of the COVID-19 declaration under Section 564(b)(1) of the Act, 21 U.S.C. section 360bbb-3(b)(1), unless the authorization is terminated or revoked.  Performed at [facility], [place] [address], [place], [place] [address]   Culture, blood (Routine X 2) w Reflex to ID Panel     Status: None (Preliminary result)   Collection Time: 01/31/2021  4:56 PM   Specimen: BLOOD  Result Value Ref Range Status   Specimen Description BLOOD RIGHT ANTECUBITAL  Final   Special Requests   Final    BOTTLES DRAWN AEROBIC AND ANAEROBIC Blood Culture adequate volume   Culture   Final    NO GROWTH < 12 HOURS Performed at [facility], [place] [address] [place], [place] [address]    Report Status PENDING  Incomplete  Culture, blood (Routine X 2) w Reflex to ID Panel     Status: None (Preliminary result)   Collection Time: 01/23/2021  4:56 PM   Specimen: BLOOD  Result Value Ref Range Status   Specimen Description BLOOD BLOOD RIGHT FOREARM  Final   Special Requests   Final    BOTTLES DRAWN AEROBIC ONLY  Blood Culture results may not be optimal due to an inadequate volume of blood received in culture bottles   Culture   Final    NO GROWTH < 12 HOURS Performed at [facility], [place] [address] [place], [place] [address]    Report Status PENDING  Incomplete         Radiology Studies: DG Chest 2 View  Result Date: 01/31/2021 CLINICAL DATA:  Chest pain EXAM: CHEST - 2 VIEW COMPARISON:  12/08/2020 FINDINGS: Cardiac enlargement. Mild vascular congestion without edema. Small right pleural effusion unchanged with associated mild right lower lobe and right middle lobe airspace disease. Left lung base clear. No left effusion. IMPRESSION: Cardiac enlargement with vascular congestion suggesting mild fluid overload Small right effusion and mild right lower lobe atelectasis/infiltrate. Electronically Signed   By: Franchot Gallo M.D.   On: 01/17/2021  10:52   CT Angio Chest/Abd/Pel for Dissection W and/or W/WO  Result Date: 01/13/2021 CLINICAL DATA:  60 year old male with left-sided chest pain and concern for thoracic aortic aneurysm. EXAM: CT ANGIOGRAPHY CHEST, ABDOMEN AND PELVIS TECHNIQUE: Non-contrast CT of the chest was initially obtained. Multidetector CT imaging through the chest, abdomen and pelvis was performed using the standard protocol during bolus administration of intravenous contrast. Multiplanar reconstructed images and MIPs were obtained and reviewed to evaluate the vascular anatomy. CONTRAST:  160mL OMNIPAQUE IOHEXOL 350 MG/ML SOLN COMPARISON:  Chest CT dated 11/26/2020. FINDINGS: CTA CHEST FINDINGS Cardiovascular: There is mild to moderate cardiomegaly, new since the prior CT. No pericardial effusion. Three-vessel coronary vascular calcification. There is retrograde flow of contrast from the right atrium into the IVC suggestive of right heart dysfunction. There is mild atherosclerotic calcification of the thoracic aorta. No aneurysmal dilatation or dissection. The ascending aorta measures  up to 3.9 cm in diameter. The origins of the great vessels of the aortic arch appear patent. The central pulmonary arteries are patent and appear unremarkable for the degree of opacification. Mediastinum/Nodes: Mildly enlarged right hilar lymph nodes measuring 13 mm. Anterior paratracheal lymph node measures 15 mm. The esophagus is grossly unremarkable. A 9 mm calcified nodule from the left posterior thyroid appears similar to prior CT. No mediastinal fluid collection. Lungs/Pleura: Bilateral patchy and confluent ground-glass opacities, right greater left. There is diffuse interstitial and interlobular septal prominence. Small right pleural effusion noted. Findings consistent with asymmetric pulmonary edema and possible superimposed pneumonia. Clinical correlation is recommended. Significant improvement in aeration of the lungs compared to prior CT. A 2.5 x 2.0 cm masslike subpleural consolidation in the right middle lobe has decreased since the prior CT. The central airways are patent. Musculoskeletal: There is diffuse osteosclerosis consistent with renal osteodystrophy. No acute osseous pathology. Review of the MIP images confirms the above findings. CTA ABDOMEN AND PELVIS FINDINGS VASCULAR Aorta: Moderate atherosclerotic calcification. No aneurysmal dilatation or dissection. No periaortic fluid collection. Celiac: Patent without evidence of aneurysm, dissection, vasculitis or significant stenosis. SMA: Patent without evidence of aneurysm, dissection, vasculitis or significant stenosis. Renals: Atherosclerotic calcification of the origin of the renal arteries. The left renal artery appears patent but with significantly diminished flow distally. Status post prior right nephrectomy. IMA: Patent without evidence of aneurysm, dissection, vasculitis or significant stenosis. Inflow: Atherosclerotic calcification of the iliac arteries. The iliac arteries remain patent. Veins: No obvious venous abnormality within the  limitations of this arterial phase study. Review of the MIP images confirms the above findings. NON-VASCULAR No intra-abdominal free air.  Small ascites. Hepatobiliary: The liver is enlarged measuring approximately 18 cm in midclavicular length. No intrahepatic biliary ductal dilatation. Gallbladder sludge versus vicarious excretion of contrast. Pancreas: Unremarkable. No pancreatic ductal dilatation or surrounding inflammatory changes. Spleen: Normal in size without focal abnormality. Adrenals/Urinary Tract: The adrenal glands unremarkable. Status post prior right nephrectomy. The left kidney is atrophic. Multiple small left renal hypodense lesions are not characterized on this CT. There is no hydronephrosis or nephrolithiasis. The left ureter is unremarkable. The urinary bladder is collapsed. Stomach/Bowel: Scattered colonic diverticula without active inflammatory changes. There is no bowel obstruction or active inflammation. The appendix is normal. Lymphatic: No adenopathy. Reproductive: The prostate and seminal vesicles are grossly unremarkable. No pelvic mass. Other: Mild diffuse subcutaneous edema.  No fluid collection. Musculoskeletal: Diffuse osteosclerosis in keeping with renal osteodystrophy. No acute osseous pathology. Review of the MIP images confirms the above findings. IMPRESSION: 1. No aortic dissection or  aneurysm. 2. Cardiomegaly with findings of asymmetric pulmonary edema and possible superimposed pneumonia. Clinical correlation is recommended. 3. Small right pleural effusion. 4. Significant improvement in aeration of the lungs compared to the prior CT. Interval decrease in the size of the right middle lobe subpleural masslike consolidation. Attention on follow-up imaging recommended. 5. Chronic kidney disease and prior right nephrectomy. Renal osteodystrophy. 6. Colonic diverticulosis. No bowel obstruction. Normal appendix. 7. Aortic Atherosclerosis (ICD10-I70.0). Electronically Signed   By: Anner Crete M.D.   On: 02/04/2021 16:21        Scheduled Meds: . albuterol  2 puff Inhalation Q6H  . amiodarone  200 mg Oral Daily  . vitamin C  500 mg Oral Daily  . atorvastatin  80 mg Oral q1800  . cinacalcet  120 mg Oral Daily  . melatonin  3 mg Oral QHS  . metoprolol tartrate  25 mg Oral BID  . [START ON 02/17/21] midodrine  10 mg Oral Once per day on Tue Thu Sat  . midodrine  5 mg Oral 3 times per day on Sun Mon Wed Fri  . midodrine  5 mg Oral 2 times per day on Tue Thu Sat  . pantoprazole (PROTONIX) IV  40 mg Intravenous Q12H  . sodium chloride flush  3 mL Intravenous Q12H  . ticagrelor  90 mg Oral BID  . zinc sulfate  220 mg Oral Daily   Continuous Infusions: . sodium chloride    . sodium chloride 10 mL/hr at 01/09/2021 1707  . heparin Stopped (01/11/2021 0811)     LOS: 0 days   Time spent: 42min  Minaal Struckman C Jericca Russett, DO [facility]  If 7PM-7AM, please contact night-coverage [email or web address]  01/11/2021, 8:26 AM

## 2021-01-31 NOTE — Progress Notes (Signed)
  Echocardiogram 2D Echocardiogram has been performed.  Travis [name] 02/05/2021, 11:58 AM

## 2021-01-31 NOTE — Progress Notes (Signed)
[place] for heparin Indication: ACS/STEMI  Labs: Recent Labs    02/05/2021 1027 02/02/2021 1247 01/20/2021 1920 01/13/2021 2237 02/05/2021 0233 01/26/2021 0822  HGB 10.6*  --   --   --  10.8*  --   HCT 35.6*  --   --   --  35.2*  --   PLT 198  --   --   --  195  --   APTT  --   --   --   --  49*  --   LABPROT  --   --   --   --  15.8*  --   INR  --   --   --   --  1.3*  --   HEPARINUNFRC  --   --   --  0.72*  --  0.10*  CREATININE 6.39*  --   --   --  8.36*  --   TROPONINIHS 1,940* 2,324* 4,219*  --   --   --     Assessment: 60 yo admitted for chest pain x 2 days that radiates into upper back.  Hx of CAD w/ MI last November with DES placed.  On Brilinta PTA.  Cardiology consulted to rule out NSTEMI/ACS given PMH.  Troponins 1940, CBC stable. Pt is COVID positive.  S/p cath today 2/23, coronary arteries not favorable for PCI. TCTS consulted for CABG evaluation (Brilinta discontinued). Pharmacy to restart heparin gtt 8h after sheath removal (removed @0946 ). Heparin gtt previously running at 950 units/hr with initial heparin level 0.72 units/mL (level drawn from this morning was drawn after heparin gtt turned off for cath). Hgb stable today, plt wnl.   Goal of Therapy:  Heparin level 0.3-0.7 units/ml Monitor platelets by anticoagulation protocol: Yes   Plan:  Resume heparin drip at 950 units/hr 8h after sheath removal @1800  8h heparin level Daily heparin level and CBC  Monitor for s/sx bleeding  Thanks for allowing pharmacy to be a part of this patient's care.  Rebbeca Paul, PharmD PGY1 Pharmacy Resident 01/16/2021 10:34 AM  Please check [email or web address] for unit-specific pharmacy phone numbers.

## 2021-01-31 NOTE — TOC Initial Note (Signed)
Transition of Care [facility]) - Initial/Assessment Note    Patient Details  Name: Travis [name] MRN: [id number] Date of Birth: 11-10-1961  [facility] CM/SW Contact:    Ninfa Meeker, RN Phone Number: 01/12/2021, 12:37 PM  Clinical Narrative:  Case manager confirmed with Adela Lank, [facility] Liaison that patient is active with [facility] for HHRN/PT. Patient has sacral wound Vac.HD schedule is Tues. Thur.Sat.  Will need resumption of care [place] orders when medically ready for discharge. TOC Team will continue to monitor.     Expected Discharge Plan: [place] Barriers to Discharge: Continued Medical Work up   Patient Goals and CMS Choice        Expected Discharge Plan and Services Expected Discharge Plan: [place] Acute Care Choice: Resumption of Svcs/PTA Provider,Home Health Living arrangements for the past 2 months: Single Family Home                           HH Arranged: RN,PT [place] Agency: [place] Date [facility] Agency Contacted: 01/31/21 Time HH Agency Contacted: 1237 Representative spoke with at [place]: Adela Lank  Prior Living Arrangements/Services Living arrangements for the past 2 months: Single Family Home                     Activities of Daily Living Home Assistive Devices/Equipment: None ADL Screening (condition at time of admission) Patient's cognitive ability adequate to safely complete daily activities?: Yes Is the patient deaf or have difficulty hearing?: No Does the patient have difficulty seeing, even when wearing glasses/contacts?: No Does the patient have difficulty concentrating, remembering, or making decisions?: No Patient able to express need for assistance with ADLs?: Yes Does the patient have difficulty dressing or bathing?: No Independently performs ADLs?: Yes (appropriate for developmental age) Does the patient have difficulty walking or climbing stairs?:  No Weakness of Legs: None Weakness of Arms/Hands: None  Permission Sought/Granted                  Emotional Assessment              Admission diagnosis:  NSTEMI (non-ST elevated myocardial infarction) [facility]) [I21.4] Patient Active Problem List   Diagnosis Date Noted   NSTEMI (non-ST elevated myocardial infarction) ([place]) 01/15/2021   GERD with esophagitis 01/25/2021   Stage IV pressure ulcer of sacral region ([place]) 01/27/2021   COVID-19 virus infection 01/20/2021   Long term current use of amiodarone 01/15/2021   PAF (paroxysmal atrial fibrillation) ([place])    Chest tube in place    Empyema [facility])    Pleural effusion    Protein-calorie malnutrition, severe 11/14/2020   Pressure injury of skin 11/09/2020   Pneumonia of right lower lobe due to methicillin susceptible Staphylococcus aureus (MSSA) ([place]) 11/02/2020   Mitral regurgitation 11/02/2020   Cardiac arrest [facility])    Dyspnea    Bacteremia 10/30/2020   Septic shock due to Staphylococcus aureus ([place]) 10/30/2020   Atrial fibrillation with RVR ([place]) 10/29/2020   Hyperlipidemia with target LDL less than 70 10/28/2020   3 V- CAD w/ ACS/STEMI: Culprit = 99% mLAD @ D1 (DES PCI jailing D1); 99% ost-AVGLCx, 80% calcified napkin ring prox RCA.  10/28/2020   Presence of drug coated stent in LAD coronary artery: Resolute Onyx DES 2.75 mm x 18 mm (3.1 mm) at major D1 & SP1. 10/28/2020  Chronic combined systolic and diastolic CHF (congestive heart failure) ([place]) 10/28/2020   ST elevation myocardial infarction (STEMI) involving left anterior descending (LAD) coronary artery with complication ([place]) 16/55/3748   Prolapsed internal hemorrhoids, grade 3, s/p ligation/pexy/hemorrhoidectomy 02/26/2018 02/26/2018   Mass    Anemia due to chronic blood loss 01/30/2015   UGIB (upper gastrointestinal bleed) 01/30/2015   Essential hypertension 01/30/2015   Anemia due to GI blood loss 01/30/2015   ESRD (end stage  renal disease) on dialysis ([place]) 08/26/2014   Drainage from wound-Left upper arm 08/11/2014   PCP:  Lujean Amel, MD Pharmacy:   [id number] - [place], [place] [address] [place] [address] Phone: [phone] Fax: [phone]  [facility] [place], [place] - [address] [place] [address] Phone: [phone] Fax: [phone]     Social Determinants of Health (SDOH) Interventions    Readmission Risk Interventions No flowsheet data found.

## 2021-01-31 NOTE — Consult Note (Addendum)
[facility] Nurse Consult Note: Patient receiving care in [phone] Covid positive Upon entering the room, the Vac machine was found not hooked to the patient and the machine was off. The machine was plugged into the bed and this was moved to the wall outlet.  Reason for Consult: Wound vac change to sacral wound Wound type: Stage 4 wound that is being managed by the wound care center, Dr. Dellia Nims Pressure Injury POA: Yes Measurement: 4 cm x 1.5 cm x 1.2 cm Wound bed: Yellow adipose tissue with some scant red, bleeding tissue Drainage (amount, consistency, odor) Golden Sanguinous drainage in the canister.  Periwound: Intact Dressing procedure/placement/frequency: Removed one piece black foam. Placed one piece of black foam in the wound, drape applied and immediate seal obtained at 125 mmHg.   [facility] team will not follow this patient. Bedside nurses to change this non-complicated dressing on M/W/F. To be changed to home vac upon discharge and continue to follow up with the wound care center.   Monitor the wound area(s) for worsening of condition such as: Signs/symptoms of infection, increase in size, development of or worsening of odor, development of pain, or increased pain at the affected locations.   Notify the medical team if any of these develop.  Thank you for the consult. [facility] nurse will not follow at this time.   Please re-consult the [facility] team if needed.  Cathlean Marseilles Tamala Julian, MSN, RN, [place], [place]le Pearl, [facility] Wound Treatment Associate Pager [phone]

## 2021-01-31 NOTE — Consult Note (Signed)
[address]       ,[place] [address]             [phone]        Germain C Furnari [place] Medical Record #[id number] Date of Birth: 20-Jan-1961  Referring: No ref. provider found Primary Care: Lujean Amel, MD Primary Cardiologist:David [name], MD  Chief Complaint:    Chief Complaint  Patient presents with   Chest Pain    History of Present Illness:      Mr. Carreon is a 60 year old male patient who was referred past medical history significant for end-stage renal disease, dialysis dependent (Tuesday, Thursday, Saturday), known coronary artery disease status post DES to the LAD on 10/29/2019, hypertension, atrial fibrillation with rapid ventricular response (cardioversion x2)  PEA arrest, GERD with esophagitis, and recent COVID-19 infection with positive test yesterday.  On Friday 01/26/1999 60 year old patient with chest pain with physical therapy he began to experience chest discomfort.  He explains the discomfort to be central in location, moderate intensity, and dull in quality and nonradiating. He also endorses mid back pain thought to be from a pulled muscle. With ongoing chest discomfort and generalized malaise with moderate to severe back pain, after his dialysis session yesterday he presented to [facility] for evaluation. In the ED, He was hypotensive and was given morphine for his chest pain. He was given a fluid bolus for his blood pressure and Dr. [name] was consulted for further recommendations. He was started on a heparin drip and continued his Brilinta. Today, he underwent cardiac catheterization where he was found to have severe heavily calcified three-vessel coronary artery disease. The LAD stent which was placed in November is occluded. Due to the severity of disease, Cardiology is recommending possible surgical revascularization. Echocardiogram was done, but the report is pending.   The patient lives in [place] and is married.  He works for [employer].    Current Activity/ Functional Status: Patient will be independent with mobility/ambulation, transfers, ADL's, IADL's.   Zubrod Score: At the time of surgery this patient's most appropriate activity status/level should be described as: []     0    Normal activity, no symptoms []     1    Restricted in physical strenuous activity but ambulatory, able to do out light work []     2    Ambulatory and capable of self care, unable to do work activities, up and about                 more than 50%  Of the time                            []     3    Only limited self care, in bed greater than 50% of waking hours []     4    Completely disabled, no self care, confined to bed or chair []     5    Moribund  Past Medical History:  Diagnosis Date   Acute ST elevation myocardial infarction (STEMI) of anterolateral wall ([place]) 10/28/2020   Culprit lesion, 99% ulcerated mid LAD at D1 -> DES PCI   Anemia of chronic disease 01/30/2015   Chronic HFrEF (heart failure with reduced ejection fraction) ([place]) 10/28/2020   LVEDP in Cath 30-35 mmHg --> EF 45-50% with Inseminate filling   Coronary artery disease involving native coronary artery of native heart with unstable angina pectoris (  [place]) 10/28/2020   Acute anterolateral STEMI: 3V CAD: Culprit Lesion = 99% mid LAD lesion (just after D1) - DES PCI; 99% ostial AV groove LCx; & 80% calcified napkin ring proximal RCA lesion with extensive calcification throughout the RCA. Successful PTCA and DES PCI of the LAD crossing D1 -resolute Onyx DES 2.75 mm x 18 mm postdilated to 3.1 mm. With PCI, there   End-stage renal disease on hemodialysis ([place])    GERD (gastroesophageal reflux disease)    takes Nexium   Hyperlipidemia with target LDL less than 70 10/28/2020   Hypertension    Pneumonia    as a child   Presence of drug coated stent in LAD coronary artery 10/28/2020   Proximal-mid LAD 99% ->0% -> Resolute Onyx DES 2.75 mm x 18 mm (3.1 mm)    Sleep apnea    uses c-pap 2 yrs    Past Surgical History:  Procedure Laterality Date   AV FISTULA PLACEMENT Left 12/22/2017   Procedure: REPAIR PSEUDOANEURYSM ARTERIOVENOUS (AV) FISTULA;  Surgeon: [name], MD;  Location: [facility];  Service: Vascular;  Laterality: Left;   [place] Left 01/05/2014   Procedure: LEFT 1ST STAGE [place];  Surgeon: Conrad Bylas, MD;  Location: [place];  Service: Vascular;  Laterality: Left;   [place] Left 07/26/2014   Procedure: Left Arm Brachial Vein Transposition Second Stage;  Surgeon: Conrad Foosland, MD;  Location: [place];  Service: Vascular;  Laterality: Left;   COLONOSCOPY     COLONOSCOPY N/A 02/02/2015   Procedure: COLONOSCOPY;  Surgeon: [name], MD;  Location: [facility] ENDOSCOPY;  Service: Endoscopy;  Laterality: N/A;   CORONARY STENT INTERVENTION N/A 10/28/2020   Procedure: CORONARY STENT INTERVENTION;  Surgeon: Leonie Man, MD;  Location: [place] CV LAB;  Successful PTCA and DES PCI of the LAD crossing D1 -resolute Onyx DES 2.75 mm x 18 mm postdilated to 3.1 mm. -- 99% TO 0%   CORONARY/GRAFT ACUTE MI REVASCULARIZATION N/A 10/28/2020   Procedure: Coronary/Graft Acute MI Revascularization;  Surgeon: Leonie Man, MD;  Location: [place] CV LAB;  Service: Cardiovascular;  mid LAD 99% DES PCI   ESOPHAGOGASTRODUODENOSCOPY Left 01/31/2015   Procedure: ESOPHAGOGASTRODUODENOSCOPY (EGD);  Surgeon: [name], MD;  Location: [facility] ENDOSCOPY;  Service: Endoscopy;  Laterality: Left;   ESOPHAGOGASTRODUODENOSCOPY N/A 11/09/2020   Procedure: ESOPHAGOGASTRODUODENOSCOPY (EGD);  Surgeon: Clarene Essex, MD;  Location: [facility];  Service: Endoscopy;  Laterality: N/A;   EVALUATION UNDER ANESTHESIA WITH HEMORRHOIDECTOMY N/A 02/26/2018   Procedure: ANORECTAL EXAM UNDER ANESTHESIA  HEMORRHOIDECTOMY x 2  HEMORRHOIDAL LIGATION AND PEXY;  Surgeon: Jyquan Boston, MD;  Location: [facility];  Service: General;   Laterality: N/A;   GIVENS CAPSULE STUDY N/A 01/31/2015   Procedure: GIVENS CAPSULE STUDY;  Surgeon: [name], MD;  Location: [facility] ENDOSCOPY;  Service: Endoscopy;  Laterality: N/A;   IR AV DIALY SHUNT INTRO NEEDLE/INTRAC INITIAL W/PTA/STENT/IMG LT Left 11/20/2020   IR US GUIDE VASC ACCESS LEFT  11/20/2020   LEFT HEART CATH AND CORONARY ANGIOGRAPHY N/A 10/28/2020   Procedure: LEFT HEART CATH AND CORONARY ANGIOGRAPHY;  Surgeon: Leonie Man, MD;  Location: [place] CV LAB;  Service: Cardiovascular; - Acute Ant-Lat STEMI-> MV CAD: Culprit = 99% mLAD (just after major D1) => DES PCI; 99% ostial AVG LCx (poor PCI target), 80% prox RCA napkin ring lesion.  EF ~40-45% (anterior - ant-lat HK). LVEDP 30 mmHg. (Used AL1 Guide for both LCA & RCA).   MOUTH  SURGERY     teeth cleaning   NEPHRECTOMY RADICAL Right 05/2015   [facility] Dr [name] for R renal mass   TRANSESOPHAGEAL ECHOCARDIOGRAM  11/01/2020    EF 35-40% moderate decreased function. Severe LA dilation. No LAA thrombus. Moderate RA dilation. 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 Mild dilation of ascending aorta measuring 40 mm. Moderate grade 3 plaque..   TRANSTHORACIC ECHOCARDIOGRAM  10/31/2020   -POST ANT STEMI (& CARDIAC ARREST) -  EF 35 to 40%. Moderate decreased function. Moderate concentric LVH GR 3 DD. Severe HK of the mid apical inferolateral and inferior wall with moderate HK of the apical septal and anterior wall. PA pressure estimated 43 mmHg. Severe LA dilation. Moderate RA dilation. Moderate to severe MR. Aortic root dilated at 41 mm. RV PSP 15 mmHg.   TRANSTHORACIC ECHOCARDIOGRAM  11/26/2020   EF 45 to 50% with mildly decreased function - mid inferoseptal, apical septal & apical wall hypokinesis. .  Moderate concentric LVH and indeterminate filling.  Mild to moderate MR due to  restricted PMV L with mitral annular calcification III B.    Social History   Tobacco Use  Smoking Status Former Smoker   Years: 4.00   Types: Cigars   Quit date: 12/31/2012   Years since quitting: 8.0  Smokeless Tobacco Never Used    Social History   Substance and Sexual Activity  Alcohol Use No     Allergies  Allergen Reactions   Lisinopril Swelling    PT states he is allergic to all prils; caused facial swelling   Ibuprofen Hives   Naproxen Hives and Other (See Comments)    Alleve causes patient to have hives    Current Facility-Administered Medications  Medication Dose Route Frequency Provider Last Rate Last Admin   0.9 %  sodium chloride infusion  250 mL Intravenous PRN Wellington Hampshire, MD       acetaminophen (TYLENOL) tablet 650 mg  650 mg Oral Q4H PRN Wellington Hampshire, MD       albuterol (VENTOLIN HFA) 108 (90 Base) MCG/ACT inhaler 2 puff  2 puff Inhalation Q6H Wellington Hampshire, MD   2 puff at 01/30/2021 0735   amiodarone (PACERONE) tablet 200 mg  200 mg Oral Daily Wellington Hampshire, MD       ascorbic acid (VITAMIN C) tablet 500 mg  500 mg Oral Daily Kathlyn Sacramento A, MD   500 mg at 01/30/2021 1700   atorvastatin (LIPITOR) tablet 80 mg  80 mg Oral q1800 Wellington Hampshire, MD   80 mg at 01/17/2021 1700   cinacalcet (SENSIPAR) tablet 120 mg  120 mg Oral Daily Wellington Hampshire, MD       fentaNYL (SUBLIMAZE) injection 12.5 mcg  12.5 mcg Intravenous Q2H PRN Wellington Hampshire, MD       Or   fentaNYL (SUBLIMAZE) injection 25 mcg  25 mcg Intravenous Q2H PRN Wellington Hampshire, MD       guaiFENesin-dextromethorphan (ROBITUSSIN DM) 100-10 MG/5ML syrup 10 mL  10 mL Oral Q4H PRN Kathlyn Sacramento A, MD       heparin ADULT infusion 100 units/mL (25000 units/255mL)  950 Units/hr Intravenous Continuous Rebbeca Paul B, RPH       melatonin tablet 3 mg  3 mg Oral QHS Kathlyn Sacramento A, MD   3 mg at 01/31/21 0028   metoprolol tartrate (LOPRESSOR) tablet 25 mg  25 mg Oral BID Wellington Hampshire, MD       [START ON 02-04-2021] midodrine (PROAMATINE) tablet 10 mg  10 mg Oral Once per day on Tue Thu Sat Wellington Hampshire, MD       midodrine (PROAMATINE) tablet 5 mg  5 mg Oral 3 times per day on Sun Mon Wed Fri Kathlyn Sacramento A, MD   5 mg at 01/09/2021 0810   midodrine (PROAMATINE) tablet 5 mg  5 mg Oral 2 times per day on Tue Thu Sat Kathlyn Sacramento A, MD   5 mg at 01/09/2021 1701   ondansetron (ZOFRAN) injection 4 mg  4 mg Intravenous Q6H PRN Wellington Hampshire, MD       pantoprazole (PROTONIX) injection 40 mg  40 mg Intravenous Q12H Kathlyn Sacramento A, MD   40 mg at 01/22/2021 2129   sodium chloride flush (NS) 0.9 % injection 3 mL  3 mL Intravenous Q12H Kathlyn Sacramento A, MD   3 mL at 01/24/2021 2131   sodium chloride flush (NS) 0.9 % injection 3 mL  3 mL Intravenous Q12H Kathlyn Sacramento A, MD       sodium chloride flush (NS) 0.9 % injection 3 mL  3 mL Intravenous PRN Wellington Hampshire, MD       zinc sulfate capsule 220 mg  220 mg Oral Daily Wellington Hampshire, MD        Medications Prior to Admission  Medication Sig Dispense Refill Last Dose   acetaminophen (TYLENOL) 325 MG tablet Take 650 mg by mouth every 6 (six) hours as needed for mild pain, fever or headache.   unknown   amiodarone (PACERONE) 200 MG tablet Take 1 tablet (200 mg total) by mouth daily. 30 tablet 1 01/14/2021 at Unknown time   atorvastatin (LIPITOR) 80 MG tablet Take 1 tablet (80 mg total) by mouth daily at 6 PM. 30 tablet 1 01/29/2021 at Unknown time   AURYXIA 1 GM 210 MG(Fe) tablet Take 210-630 mg by mouth See admin instructions. 630mg  three times daily with meals and 210-420mg  with snacks  11 01/29/2021 at Unknown time   cinacalcet (SENSIPAR) 60 MG tablet Take 120 mg by mouth daily.   01/17/2021 at Unknown time   heparin sodium, porcine, 1000 UNIT/ML injection Heparin (Porcine) Preserv Free 1,000 Units/mL Systemic      metoprolol tartrate (LOPRESSOR) 25 MG tablet Take 1 tablet (25 mg total) by mouth 2 (two) times  daily. 90 tablet 1 01/29/2021 at 0600   midodrine (PROAMATINE) 5 MG tablet Take 5 mg by mouth 3 (three) times daily with meals. On dialysis days take 10mg  before as the first dose, and then 5mg  for the second and third dose of the day.   01/23/2021 at Unknown time   multivitamin (RENA-VIT) TABS tablet Take 1 tablet by mouth daily.   01/29/2021 at Unknown time   multivitamin-iron-minerals-folic acid (CENTRUM) chewable tablet Chew 1 tablet by mouth daily.   01/25/2021 at Unknown time   pantoprazole (PROTONIX) 40 MG tablet Take 1 tablet (40 mg total) by mouth 2 (two) times daily. (Patient taking differently: Take 40 mg by mouth daily.) 60 tablet 0 01/16/2021 at Unknown time   ticagrelor (BRILINTA) 90 MG TABS tablet Take 1 tablet (90 mg total) by mouth 2 (two) times daily. 60 tablet 1 02/05/2021 at [place] History  Problem Relation Age of Onset   Hypertension Mother    Hypertension Father    Hypertension Sister  Review of Systems:   ROS Pertinent items noted in HPI and remainder of comprehensive ROS otherwise negative.     Cardiac Review of Systems: Y or  [    ]= no  Chest Pain [    ]  Resting SOB [   ] Exertional SOB  [  ]  Orthopnea [  ]   Pedal Edema [   ]    Palpitations [  ] Syncope  [  ]   Presyncope [   ]  General Review of Systems: [Y] = yes [  ]=no Constitional: recent weight change [  ]; anorexia [  ]; fatigue [  ]; nausea [  ]; night sweats [  ]; fever [  ]; or chills [  ]                                                               Dental: Last Dentist visit:   Eye : blurred vision [  ]; diplopia [   ]; vision changes [  ];  Amaurosis fugax[  ]; Resp: cough [  ];  wheezing[  ];  hemoptysis[  ]; shortness of breath[  ]; paroxysmal nocturnal dyspnea[  ]; dyspnea on exertion[  ]; or orthopnea[  ];  GI:  gallstones[  ], vomiting[  ];  dysphagia[  ]; melena[  ];  hematochezia [  ]; heartburn[  ];   Hx of  Colonoscopy[  ]; GU: kidney stones [  ]; hematuria[  ];   dysuria [  ];   nocturia[  ];  history of     obstruction [  ]; urinary frequency [  ]             Skin: rash, swelling[  ];, hair loss[  ];  peripheral edema[  ];  or itching[  ]; Musculosketetal: myalgias[  ];  joint swelling[  ];  joint erythema[  ];  joint pain[  ];  back pain[  ];  Heme/Lymph: bruising[  ];  bleeding[  ];  anemia[  ];  Neuro: TIA[  ];  headaches[  ];  stroke[  ];  vertigo[  ];  seizures[  ];   paresthesias[  ];  difficulty walking[  ];  Psych:depression[  ]; anxiety[  ];  Endocrine: diabetes[  ];  thyroid dysfunction[  ];              Physical Exam: BP 91/64 (BP Location: Right Arm)   Pulse 73   Temp 97.8 F (36.6 C) (Oral)   Resp 14   Ht 5\' 11"  (1.803 m)   Wt 77.2 kg   SpO2 100%   BMI 23.72 kg/m    General appearance: alert and cooperative Resp: clear to auscultation bilaterally Cardio: regular rate and rhythm, S1, S2 normal, no murmur, click, rub or gallop Extremities: edema 2 Neurologic: Alert and oriented X 3, normal strength and tone. Normal symmetric reflexes. Normal coordination and gait  Diagnostic Studies & Laboratory data:     Recent Radiology Findings:   DG Chest 2 View  Result Date: 01/13/2021 CLINICAL DATA:  Chest pain EXAM: CHEST - 2 VIEW COMPARISON:  12/08/2020 FINDINGS: Cardiac enlargement. Mild vascular congestion without edema. Small right pleural effusion unchanged with associated mild right lower lobe and right  middle lobe airspace disease. Left lung base clear. No left effusion. IMPRESSION: Cardiac enlargement with vascular congestion suggesting mild fluid overload Small right effusion and mild right lower lobe atelectasis/infiltrate. Electronically Signed   By: Franchot Gallo M.D.   On: 01/26/2021 10:52   CARDIAC CATHETERIZATION  Result Date: 01/16/2021  1st Diag lesion is 90% stenosed.  Balloon angioplasty was performed.  LPAV lesion is 99% stenosed.  Prox RCA lesion is 80% stenosed.  Dist RCA lesion is 90% stenosed.  RPDA lesion is 70%  stenosed.  Ost Cx to Prox Cx lesion is 80% stenosed.  Mid LAD lesion is 100% stenosed.  Prox LAD lesion is 40% stenosed.  1.  Severe heavily calcified three-vessel coronary artery disease.  The LAD stent which was placed in November is occluded.  In addition, there is significant progression of distal RCA disease as well as proximal left circumflex and first diagonal disease. 2.  Mildly elevated left ventricular end-diastolic pressure at 22 mmHg.  Left ventricular angiography was not performed. Recommendations: The coronary arteries are not favorable for PCI. Recommend evaluation for CABG. I ordered an echocardiogram. I discontinued Brilinta. Resume heparin drip 8 hours after sheath pull.   CT Angio Chest/Abd/Pel for Dissection W and/or W/WO  Result Date: 01/31/2021 CLINICAL DATA:  60 year old male with left-sided chest pain and concern for thoracic aortic aneurysm. EXAM: CT ANGIOGRAPHY CHEST, ABDOMEN AND PELVIS TECHNIQUE: Non-contrast CT of the chest was initially obtained. Multidetector CT imaging through the chest, abdomen and pelvis was performed using the standard protocol during bolus administration of intravenous contrast. Multiplanar reconstructed images and MIPs were obtained and reviewed to evaluate the vascular anatomy. CONTRAST:  146mL OMNIPAQUE IOHEXOL 350 MG/ML SOLN COMPARISON:  Chest CT dated 11/26/2020. FINDINGS: CTA CHEST FINDINGS Cardiovascular: There is mild to moderate cardiomegaly, new since the prior CT. No pericardial effusion. Three-vessel coronary vascular calcification. There is retrograde flow of contrast from the right atrium into the IVC suggestive of right heart dysfunction. There is mild atherosclerotic calcification of the thoracic aorta. No aneurysmal dilatation or dissection. The ascending aorta measures up to 3.9 cm in diameter. The origins of the great vessels of the aortic arch appear patent. The central pulmonary arteries are patent and appear unremarkable for the degree  of opacification. Mediastinum/Nodes: Mildly enlarged right hilar lymph nodes measuring 13 mm. Anterior paratracheal lymph node measures 15 mm. The esophagus is grossly unremarkable. A 9 mm calcified nodule from the left posterior thyroid appears similar to prior CT. No mediastinal fluid collection. Lungs/Pleura: Bilateral patchy and confluent ground-glass opacities, right greater left. There is diffuse interstitial and interlobular septal prominence. Small right pleural effusion noted. Findings consistent with asymmetric pulmonary edema and possible superimposed pneumonia. Clinical correlation is recommended. Significant improvement in aeration of the lungs compared to prior CT. A 2.5 x 2.0 cm masslike subpleural consolidation in the right middle lobe has decreased since the prior CT. The central airways are patent. Musculoskeletal: There is diffuse osteosclerosis consistent with renal osteodystrophy. No acute osseous pathology. Review of the MIP images confirms the above findings. CTA ABDOMEN AND PELVIS FINDINGS VASCULAR Aorta: Moderate atherosclerotic calcification. No aneurysmal dilatation or dissection. No periaortic fluid collection. Celiac: Patent without evidence of aneurysm, dissection, vasculitis or significant stenosis. SMA: Patent without evidence of aneurysm, dissection, vasculitis or significant stenosis. Renals: Atherosclerotic calcification of the origin of the renal arteries. The left renal artery appears patent but with significantly diminished flow distally. Status post prior right nephrectomy. IMA: Patent without evidence of aneurysm, dissection, vasculitis  or significant stenosis. Inflow: Atherosclerotic calcification of the iliac arteries. The iliac arteries remain patent. Veins: No obvious venous abnormality within the limitations of this arterial phase study. Review of the MIP images confirms the above findings. NON-VASCULAR No intra-abdominal free air.  Small ascites. Hepatobiliary: The liver  is enlarged measuring approximately 18 cm in midclavicular length. No intrahepatic biliary ductal dilatation. Gallbladder sludge versus vicarious excretion of contrast. Pancreas: Unremarkable. No pancreatic ductal dilatation or surrounding inflammatory changes. Spleen: Normal in size without focal abnormality. Adrenals/Urinary Tract: The adrenal glands unremarkable. Status post prior right nephrectomy. The left kidney is atrophic. Multiple small left renal hypodense lesions are not characterized on this CT. There is no hydronephrosis or nephrolithiasis. The left ureter is unremarkable. The urinary bladder is collapsed. Stomach/Bowel: Scattered colonic diverticula without active inflammatory changes. There is no bowel obstruction or active inflammation. The appendix is normal. Lymphatic: No adenopathy. Reproductive: The prostate and seminal vesicles are grossly unremarkable. No pelvic mass. Other: Mild diffuse subcutaneous edema.  No fluid collection. Musculoskeletal: Diffuse osteosclerosis in keeping with renal osteodystrophy. No acute osseous pathology. Review of the MIP images confirms the above findings. IMPRESSION: 1. No aortic dissection or aneurysm. 2. Cardiomegaly with findings of asymmetric pulmonary edema and possible superimposed pneumonia. Clinical correlation is recommended. 3. Small right pleural effusion. 4. Significant improvement in aeration of the lungs compared to the prior CT. Interval decrease in the size of the right middle lobe subpleural masslike consolidation. Attention on follow-up imaging recommended. 5. Chronic kidney disease and prior right nephrectomy. Renal osteodystrophy. 6. Colonic diverticulosis. No bowel obstruction. Normal appendix. 7. Aortic Atherosclerosis (ICD10-I70.0). Electronically Signed   By: Anner Crete M.D.   On: 01/21/2021 16:21     I have independently reviewed the above radiologic studies and discussed with the patient   Recent Lab Findings: Lab Results   Component Value Date   WBC 9.6 02/03/2021   HGB 10.8 (L) 01/11/2021   HCT 35.2 (L) 01/22/2021   PLT 195 01/11/2021   GLUCOSE 92 01/22/2021   CHOL 151 10/29/2020   TRIG 71 11/30/2020   HDL 13 (L) 10/29/2020   LDLCALC 61 10/29/2020   ALT 142 (H) 01/24/2021   AST 95 (H) 01/17/2021   NA 135 02/04/2021   K 5.1 01/27/2021   CL 95 (L) 01/10/2021   CREATININE 8.36 (H) 01/24/2021   BUN 73 (H) 01/19/2021   CO2 20 (L) 01/15/2021   TSH 0.882 01/30/2015   INR 1.3 (H) 01/13/2021   HGBA1C 4.3 (L) 10/28/2020      Assessment / Plan:      1. NSTEMI/Multivessel CAD-Cardiac cath report listed above. Brilinta stopped and Heparin continued. Continue asa, statin, metoprolol and midodrine.  2. Hx of atrial fibrillation with RVR- continue Amio 200 daily.  3. ESRD- dialysis dependent  (tues, thrus, sat). Renal consulted 4. GERD with esophagitis-on protonix 5. Ischemic cardiomyopathy- Echocardiogram pending  6. Sacral decubitus-wound vac in place, per attending 7. COVID 19+ yesterday, no symptoms, no recent fevers   Plan: Dr. Orvan Seen to evaluate and determine candidacy. Will follow-up of echocardiogram. Awaiting Brilinta washout and ultimately this will determine timing of surgery.    I  spent 20 minutes counseling the patient face to face.   Nicholes Rough, PA-C 01/31/2021 10:44 AM

## 2021-01-31 NOTE — Progress Notes (Signed)
Progress Note  Patient Name: Travis [name] Date of Encounter: 01/23/2021  [facility] Cardiologist: Glenetta Hew, MD   Subjective   No active chest pain. Seen post cath  Inpatient Medications    Scheduled Meds: . albuterol  2 puff Inhalation Q6H  . amiodarone  200 mg Oral Daily  . vitamin C  500 mg Oral Daily  . atorvastatin  80 mg Oral q1800  . cinacalcet  120 mg Oral Daily  . melatonin  3 mg Oral QHS  . metoprolol tartrate  25 mg Oral BID  . [START ON 02/09/2021] midodrine  10 mg Oral Once per day on Tue Thu Sat  . midodrine  5 mg Oral 3 times per day on Sun Mon Wed Fri  . midodrine  5 mg Oral 2 times per day on Tue Thu Sat  . pantoprazole (PROTONIX) IV  40 mg Intravenous Q12H  . sodium chloride flush  3 mL Intravenous Q12H  . sodium chloride flush  3 mL Intravenous Q12H  . zinc sulfate  220 mg Oral Daily   Continuous Infusions: . sodium chloride    . heparin     PRN Meds: sodium chloride, acetaminophen, fentaNYL (SUBLIMAZE) injection **OR** fentaNYL (SUBLIMAZE) injection, guaiFENesin-dextromethorphan, ondansetron (ZOFRAN) IV, sodium chloride flush   Vital Signs    Vitals:   01/14/2021 0956 02/05/2021 1001 01/13/2021 1005 01/30/2021 1027  BP: 107/71 100/66  91/64  Pulse: 73 73 75 73  Resp: (!) 23 (!) 24 14   Temp:    97.8 F (36.6 C)  TempSrc:    Oral  SpO2: 99% 98% 97% 100%  Weight:      Height:        Intake/Output Summary (Last 24 hours) at 01/25/2021 1040 Last data filed at 01/29/2021 2030 Gross per 24 hour  Intake 432.36 ml  Output --  Net 432.36 ml   Last 3 Weights 01/21/2021 01/26/2021 01/15/2021  Weight (lbs) 170 lb 1.6 oz 170 lb 178 lb 9.6 oz  Weight (kg) 77.157 kg 77.111 kg 81.012 kg      Telemetry    NSR - Personally Reviewed  ECG    NSR old anterior infarct with poor R wave progression - Personally Reviewed  Physical Exam   GEN: No acute distress.   Neck: No JVD Cardiac: RRR, no murmurs, rubs, or gallops.  Respiratory: Clear to  auscultation bilaterally. GI: Soft, nontender, non-distended  MS: No edema; No deformity. Left femoral site without hematoma. Left arm AV fistula.  Has sacral wound vac in place- unable to exam at this time since post cath  Neuro:  Nonfocal  Psych: Normal affect   Labs    High Sensitivity Troponin:   Recent Labs  Lab 01/18/2021 1027 02/03/2021 1247 01/17/2021 1920  TROPONINIHS 1,940* 2,324* 4,219*      Chemistry Recent Labs  Lab 01/29/2021 1027 01/21/2021 0233  NA 139 135  K 3.7 5.1  CL 95* 95*  CO2 30 20*  GLUCOSE 104* 92  BUN 51* 73*  CREATININE 6.39* 8.36*  CALCIUM 8.4* 7.7*  PROT  --  6.4*  ALBUMIN  --  3.2*  AST  --  95*  ALT  --  142*  ALKPHOS  --  164*  BILITOT  --  1.1  GFRNONAA 9* 7*  ANIONGAP 14 20*     Hematology Recent Labs  Lab 01/09/2021 1027 01/31/21 0233  WBC 9.6 9.6  RBC 3.85* 3.82*  HGB 10.6* 10.8*  HCT 35.6* 35.2*  MCV  92.5 92.1  MCH 27.5 28.3  MCHC 29.8* 30.7  RDW 17.9* 18.0*  PLT 198 195    BNPNo results for input(s): BNP, PROBNP in the last 168 hours.   DDimer  Recent Labs  Lab 01/26/2021 1656 01/30/2021 0233  DDIMER 2.16* 1.20*     Radiology    DG Chest 2 View  Result Date: 01/09/2021 CLINICAL DATA:  Chest pain EXAM: CHEST - 2 VIEW COMPARISON:  12/08/2020 FINDINGS: Cardiac enlargement. Mild vascular congestion without edema. Small right pleural effusion unchanged with associated mild right lower lobe and right middle lobe airspace disease. Left lung base clear. No left effusion. IMPRESSION: Cardiac enlargement with vascular congestion suggesting mild fluid overload Small right effusion and mild right lower lobe atelectasis/infiltrate. Electronically Signed   By: Franchot Gallo M.D.   On: 02/04/2021 10:52   CARDIAC CATHETERIZATION  Result Date: 01/22/2021  1st Diag lesion is 90% stenosed.  Balloon angioplasty was performed.  LPAV lesion is 99% stenosed.  Prox RCA lesion is 80% stenosed.  Dist RCA lesion is 90% stenosed.  RPDA  lesion is 70% stenosed.  Ost Cx to Prox Cx lesion is 80% stenosed.  Mid LAD lesion is 100% stenosed.  Prox LAD lesion is 40% stenosed.  1.  Severe heavily calcified three-vessel coronary artery disease.  The LAD stent which was placed in November is occluded.  In addition, there is significant progression of distal RCA disease as well as proximal left circumflex and first diagonal disease. 2.  Mildly elevated left ventricular end-diastolic pressure at 22 mmHg.  Left ventricular angiography was not performed. Recommendations: The coronary arteries are not favorable for PCI. Recommend evaluation for CABG. I ordered an echocardiogram. I discontinued Brilinta. Resume heparin drip 8 hours after sheath pull.   CT Angio Chest/Abd/Pel for Dissection W and/or W/WO  Result Date: 01/31/2021 CLINICAL DATA:  60 year old male with left-sided chest pain and concern for thoracic aortic aneurysm. EXAM: CT ANGIOGRAPHY CHEST, ABDOMEN AND PELVIS TECHNIQUE: Non-contrast CT of the chest was initially obtained. Multidetector CT imaging through the chest, abdomen and pelvis was performed using the standard protocol during bolus administration of intravenous contrast. Multiplanar reconstructed images and MIPs were obtained and reviewed to evaluate the vascular anatomy. CONTRAST:  151mL OMNIPAQUE IOHEXOL 350 MG/ML SOLN COMPARISON:  Chest CT dated 11/26/2020. FINDINGS: CTA CHEST FINDINGS Cardiovascular: There is mild to moderate cardiomegaly, new since the prior CT. No pericardial effusion. Three-vessel coronary vascular calcification. There is retrograde flow of contrast from the right atrium into the IVC suggestive of right heart dysfunction. There is mild atherosclerotic calcification of the thoracic aorta. No aneurysmal dilatation or dissection. The ascending aorta measures up to 3.9 cm in diameter. The origins of the great vessels of the aortic arch appear patent. The central pulmonary arteries are patent and appear unremarkable  for the degree of opacification. Mediastinum/Nodes: Mildly enlarged right hilar lymph nodes measuring 13 mm. Anterior paratracheal lymph node measures 15 mm. The esophagus is grossly unremarkable. A 9 mm calcified nodule from the left posterior thyroid appears similar to prior CT. No mediastinal fluid collection. Lungs/Pleura: Bilateral patchy and confluent ground-glass opacities, right greater left. There is diffuse interstitial and interlobular septal prominence. Small right pleural effusion noted. Findings consistent with asymmetric pulmonary edema and possible superimposed pneumonia. Clinical correlation is recommended. Significant improvement in aeration of the lungs compared to prior CT. A 2.5 x 2.0 cm masslike subpleural consolidation in the right middle lobe has decreased since the prior CT. The central airways are patent.  Musculoskeletal: There is diffuse osteosclerosis consistent with renal osteodystrophy. No acute osseous pathology. Review of the MIP images confirms the above findings. CTA ABDOMEN AND PELVIS FINDINGS VASCULAR Aorta: Moderate atherosclerotic calcification. No aneurysmal dilatation or dissection. No periaortic fluid collection. Celiac: Patent without evidence of aneurysm, dissection, vasculitis or significant stenosis. SMA: Patent without evidence of aneurysm, dissection, vasculitis or significant stenosis. Renals: Atherosclerotic calcification of the origin of the renal arteries. The left renal artery appears patent but with significantly diminished flow distally. Status post prior right nephrectomy. IMA: Patent without evidence of aneurysm, dissection, vasculitis or significant stenosis. Inflow: Atherosclerotic calcification of the iliac arteries. The iliac arteries remain patent. Veins: No obvious venous abnormality within the limitations of this arterial phase study. Review of the MIP images confirms the above findings. NON-VASCULAR No intra-abdominal free air.  Small ascites.  Hepatobiliary: The liver is enlarged measuring approximately 18 cm in midclavicular length. No intrahepatic biliary ductal dilatation. Gallbladder sludge versus vicarious excretion of contrast. Pancreas: Unremarkable. No pancreatic ductal dilatation or surrounding inflammatory changes. Spleen: Normal in size without focal abnormality. Adrenals/Urinary Tract: The adrenal glands unremarkable. Status post prior right nephrectomy. The left kidney is atrophic. Multiple small left renal hypodense lesions are not characterized on this CT. There is no hydronephrosis or nephrolithiasis. The left ureter is unremarkable. The urinary bladder is collapsed. Stomach/Bowel: Scattered colonic diverticula without active inflammatory changes. There is no bowel obstruction or active inflammation. The appendix is normal. Lymphatic: No adenopathy. Reproductive: The prostate and seminal vesicles are grossly unremarkable. No pelvic mass. Other: Mild diffuse subcutaneous edema.  No fluid collection. Musculoskeletal: Diffuse osteosclerosis in keeping with renal osteodystrophy. No acute osseous pathology. Review of the MIP images confirms the above findings. IMPRESSION: 1. No aortic dissection or aneurysm. 2. Cardiomegaly with findings of asymmetric pulmonary edema and possible superimposed pneumonia. Clinical correlation is recommended. 3. Small right pleural effusion. 4. Significant improvement in aeration of the lungs compared to the prior CT. Interval decrease in the size of the right middle lobe subpleural masslike consolidation. Attention on follow-up imaging recommended. 5. Chronic kidney disease and prior right nephrectomy. Renal osteodystrophy. 6. Colonic diverticulosis. No bowel obstruction. Normal appendix. 7. Aortic Atherosclerosis (ICD10-I70.0). Electronically Signed   By: Anner Crete M.D.   On: 01/11/2021 16:21    Cardiac Studies   LEFT HEART CATH AND CORONARY ANGIOGRAPHY    Conclusion    1st Diag lesion is 90%  stenosed.  Balloon angioplasty was performed.  LPAV lesion is 99% stenosed.  Prox RCA lesion is 80% stenosed.  Dist RCA lesion is 90% stenosed.  RPDA lesion is 70% stenosed.  Ost Cx to Prox Cx lesion is 80% stenosed.  Mid LAD lesion is 100% stenosed.  Prox LAD lesion is 40% stenosed.   1.  Severe heavily calcified three-vessel coronary artery disease.  The LAD stent which was placed in November is occluded.  In addition, there is significant progression of distal RCA disease as well as proximal left circumflex and first diagonal disease. 2.  Mildly elevated left ventricular end-diastolic pressure at 22 mmHg.  Left ventricular angiography was not performed.  Recommendations: The coronary arteries are not favorable for PCI. Recommend evaluation for CABG. I ordered an echocardiogram. I discontinued Brilinta. Resume heparin drip 8 hours after sheath pull.  Coronary Diagrams   Diagnostic Dominance: Right        Patient Profile     60 y.o. male with complex medical history s/p anterior STEMI in November with emergent stenting of the mid  LAD. Hospital course complicated by MRSA PNA with pleural effusion--chest tube, bacteremia, ESRD requiring dialysis, GI bleed,  and sacral decubitus. DC on Jan 6 and has been improving. Now presents with NSTEMI  Assessment & Plan    1. NSTEMI. Peak troponin 4219. No significant Ecg changes over baseline. Chest pain now resolved. Repeat cardiac cath today showed marked progression of CAD. LAD stent now occluded. Severe disease in a large first diagonal, ostial and mid LCx, proximal, mid and distal RCA. Poorly suited for PCI. Will obtain CT surgery consult. Brilinta held. History of ASA allergy. Plan to resume IV heparin. Echo pending. Continue statin and metoprolol. CT surgery consultation for CABG 2. ESRD on HD. Had dialysis yesterday. Consult Renal. 3. History of Afib. Maintaining NSR on amiodarone. 4. Ischemic CM. Prior EF 40-45%. Repeat  Echo pending 5. Sacral decubitus. Wound Vac in place.wound care team following.  6. Covid 19 + - no clinical symptoms.  7. GERD. On protonix.      For questions or updates, please contact [place] Please consult [email or web address] for contact info under        Signed, Breannah Kratt [place], MD  01/28/2021, 10:40 AM

## 2021-02-01 ENCOUNTER — Inpatient Hospital Stay (HOSPITAL_COMMUNITY): Payer: Medicare Other

## 2021-02-01 ENCOUNTER — Inpatient Hospital Stay (HOSPITAL_COMMUNITY): Payer: Medicare Other | Admitting: Critical Care Medicine

## 2021-02-01 DIAGNOSIS — I469 Cardiac arrest, cause unspecified: Secondary | ICD-10-CM

## 2021-02-01 DIAGNOSIS — U071 COVID-19: Secondary | ICD-10-CM | POA: Diagnosis not present

## 2021-02-01 DIAGNOSIS — I214 Non-ST elevation (NSTEMI) myocardial infarction: Secondary | ICD-10-CM | POA: Diagnosis not present

## 2021-02-01 DIAGNOSIS — R570 Cardiogenic shock: Secondary | ICD-10-CM | POA: Diagnosis not present

## 2021-02-01 LAB — CBC
HCT: 34 % — ABNORMAL LOW (ref 39.0–52.0)
Hemoglobin: 10.5 g/dL — ABNORMAL LOW (ref 13.0–17.0)
MCH: 28.1 pg (ref 26.0–34.0)
MCHC: 30.9 g/dL (ref 30.0–36.0)
MCV: 90.9 fL (ref 80.0–100.0)
Platelets: 191 10*3/uL (ref 150–400)
RBC: 3.74 MIL/uL — ABNORMAL LOW (ref 4.22–5.81)
RDW: 18 % — ABNORMAL HIGH (ref 11.5–15.5)
WBC: 9.7 10*3/uL (ref 4.0–10.5)
nRBC: 0.3 % — ABNORMAL HIGH (ref 0.0–0.2)

## 2021-02-01 LAB — FERRITIN: Ferritin: >7500 ng/mL — ABNORMAL HIGH (ref 24–336)

## 2021-02-01 LAB — HEPARIN LEVEL (UNFRACTIONATED): Heparin Unfractionated: <0.1 IU/mL — ABNORMAL LOW (ref 0.30–0.70)

## 2021-02-01 LAB — BLOOD GAS, ARTERIAL
Acid-base deficit: 15.5 mmol/L — ABNORMAL HIGH (ref 0.0–2.0)
Bicarbonate: 10.8 mmol/L — ABNORMAL LOW (ref 20.0–28.0)
FIO2: 100
O2 Saturation: 97.7 %
Patient temperature: 36.7
pCO2 arterial: 27.3 mmHg — ABNORMAL LOW (ref 32.0–48.0)
pH, Arterial: 7.22 — ABNORMAL LOW (ref 7.350–7.450)
pO2, Arterial: 144 mmHg — ABNORMAL HIGH (ref 83.0–108.0)

## 2021-02-01 LAB — C-REACTIVE PROTEIN: CRP: 4.8 mg/dL — ABNORMAL HIGH (ref <1.0)

## 2021-02-01 LAB — D-DIMER, QUANTITATIVE: D-Dimer, Quant: 1.23 ug/mL-FEU — ABNORMAL HIGH (ref 0.00–0.50)

## 2021-02-01 LAB — MAGNESIUM: Magnesium: 2.3 mg/dL (ref 1.7–2.4)

## 2021-02-01 LAB — PHOSPHORUS: Phosphorus: 8 mg/dL — ABNORMAL HIGH (ref 2.5–4.6)

## 2021-02-01 LAB — GLUCOSE, CAPILLARY
Glucose-Capillary: 50 mg/dL — ABNORMAL LOW (ref 70–99)
Glucose-Capillary: 89 mg/dL (ref 70–99)

## 2021-02-01 LAB — TROPONIN I (HIGH SENSITIVITY): Troponin I (High Sensitivity): 12691 ng/L (ref <18)

## 2021-02-01 MED ORDER — DEXTROSE 50 % IV SOLN
INTRAVENOUS | Status: AC
  Administered 2021-02-01: 50 mL
  Filled 2021-02-01: qty 50

## 2021-02-01 MED ORDER — MIDAZOLAM HCL 2 MG/2ML IJ SOLN
2.0000 mg | INTRAMUSCULAR | Status: DC | PRN
  Administered 2021-02-01: 2 mg via INTRAVENOUS
  Filled 2021-02-01: qty 2

## 2021-02-01 MED ORDER — AMIODARONE HCL IN DEXTROSE 360-4.14 MG/200ML-% IV SOLN
INTRAVENOUS | Status: AC
  Filled 2021-02-01: qty 200

## 2021-02-01 MED ORDER — PRISMASOL BGK 4/2.5 32-4-2.5 MEQ/L REPLACEMENT SOLN
Status: DC
  Filled 2021-02-01 (×2): qty 5000

## 2021-02-01 MED ORDER — AMIODARONE HCL IN DEXTROSE 360-4.14 MG/200ML-% IV SOLN
60.0000 mg/h | INTRAVENOUS | Status: AC
  Administered 2021-02-01: 60 mg/h via INTRAVENOUS

## 2021-02-01 MED ORDER — PRISMASOL BGK 4/2.5 32-4-2.5 MEQ/L REPLACEMENT SOLN
Status: DC
  Filled 2021-02-01: qty 5000

## 2021-02-01 MED ORDER — FENTANYL 2500MCG IN NS 250ML (10MCG/ML) PREMIX INFUSION
50.0000 ug/h | INTRAVENOUS | Status: DC
  Administered 2021-02-01: 50 ug/h via INTRAVENOUS
  Filled 2021-02-01: qty 250

## 2021-02-01 MED ORDER — NOREPINEPHRINE 4 MG/250ML-% IV SOLN
0.0000 ug/min | INTRAVENOUS | Status: DC
  Administered 2021-02-01: 70 ug/min via INTRAVENOUS
  Administered 2021-02-01: 65 ug/min via INTRAVENOUS
  Administered 2021-02-01: 5 ug/min via INTRAVENOUS
  Filled 2021-02-01 (×4): qty 250

## 2021-02-01 MED ORDER — VASOPRESSIN 20 UNITS/100 ML INFUSION FOR SHOCK
0.0000 [IU]/min | INTRAVENOUS | Status: DC
  Administered 2021-02-01: 0.03 [IU]/min via INTRAVENOUS
  Filled 2021-02-01: qty 100

## 2021-02-01 MED ORDER — MIDAZOLAM HCL 2 MG/2ML IJ SOLN
2.0000 mg | INTRAMUSCULAR | Status: DC | PRN
Start: 2021-02-01 — End: 2021-02-01

## 2021-02-01 MED ORDER — CHLORHEXIDINE GLUCONATE CLOTH 2 % EX PADS: 6.0000 | MEDICATED_PAD | Freq: Every day | CUTANEOUS | Status: DC

## 2021-02-01 MED ORDER — ALTEPLASE 2 MG IJ SOLR: 2.0000 mg | Freq: Once | INTRAMUSCULAR | Status: DC | PRN

## 2021-02-01 MED ORDER — PRISMASOL BGK 4/2.5 32-4-2.5 MEQ/L EC SOLN
Status: DC
  Filled 2021-02-01 (×8): qty 5000

## 2021-02-01 MED ORDER — NOREPINEPHRINE 4 MG/250ML-% IV SOLN: 0.0000 ug/min | INTRAVENOUS | Status: DC

## 2021-02-01 MED ORDER — SODIUM CHLORIDE 0.9 % FOR CRRT: INTRAVENOUS_CENTRAL | Status: DC | PRN

## 2021-02-01 MED ORDER — HEPARIN SODIUM (PORCINE) 1000 UNIT/ML DIALYSIS
1000.0000 [IU] | INTRAMUSCULAR | Status: DC | PRN
  Filled 2021-02-01: qty 6

## 2021-02-01 MED ORDER — "THROMBI-PAD 3""X3"" EX PADS"
1.0000 | MEDICATED_PAD | Freq: Once | CUTANEOUS | Status: DC
  Filled 2021-02-01: qty 1

## 2021-02-01 MED ORDER — FENTANYL CITRATE (PF) 100 MCG/2ML IJ SOLN
50.0000 ug | Freq: Once | INTRAMUSCULAR | Status: DC
Start: 2021-02-01 — End: 2021-02-01

## 2021-02-01 MED ORDER — AMIODARONE LOAD VIA INFUSION
150.0000 mg | Freq: Once | INTRAVENOUS | Status: AC
  Administered 2021-02-01: 150 mg via INTRAVENOUS
  Filled 2021-02-01: qty 83.34

## 2021-02-01 MED ORDER — AMIODARONE HCL IN DEXTROSE 360-4.14 MG/200ML-% IV SOLN: 30.0000 mg/h | INTRAVENOUS | Status: DC

## 2021-02-01 MED ORDER — EPINEPHRINE HCL 5 MG/250ML IV SOLN IN NS
0.5000 ug/min | INTRAVENOUS | Status: DC
  Filled 2021-02-01: qty 250

## 2021-02-01 MED ORDER — FENTANYL BOLUS VIA INFUSION
50.0000 ug | INTRAVENOUS | Status: DC | PRN
  Filled 2021-02-01: qty 50

## 2021-02-04 LAB — CULTURE, BLOOD (ROUTINE X 2)
Culture: NO GROWTH
Culture: NO GROWTH
Special Requests: ADEQUATE

## 2021-02-06 ENCOUNTER — Encounter (HOSPITAL_BASED_OUTPATIENT_CLINIC_OR_DEPARTMENT_OTHER): Payer: Medicare Other | Admitting: Internal Medicine

## 2021-02-06 NOTE — Progress Notes (Signed)
Responded to unit page to support family at bedside.  Daughter and fianc'e presence with patient. Pt. Passed. Provided emotional and spiritual support and facilitated information sharing.  Provided prayerand ministry of presence.  Jaclynn Major, [place], [employer], Pager [phone]

## 2021-02-06 NOTE — Progress Notes (Signed)
Ventilator stopped at 1155 per MD order.

## 2021-02-06 NOTE — Discharge Summary (Signed)
Death Summary  AZARIA BARTELL FQM:[id number] DOB: 12-07-1961 DOA: 02-04-2021  PCP: Lujean Amel, MD  Admit date: 02-04-21 Date of Death: 02/07/21  Final Diagnoses:  Principal Problem:   NSTEMI (non-ST elevated myocardial infarction) [facility]) Active Problems:   ESRD (end stage renal disease) ([place])   Essential hypertension   Chronic combined systolic and diastolic CHF (congestive heart failure) (H[facility])   PAF (paroxysmal atrial fibrillation) ([place])   GERD with esophagitis   Stage IV pressure ulcer of sacral region [facility])   COVID-19 virus infection   Cardiogenic shock [facility])  Hospital Course:  Patient recently hospitalized November 21 through early January 2022 for anterior STEMI status post PCI with notably severe disease.  Patient readmitted on February 04, 2021 for worsening chest pain, reevaluated by cardiology with repeat catheterization with worsening multivessel disease.  Planned for further evaluation with vascular for CABG, unfortunately early morning 2021-02-06 patient had worsening chest pain, profound hypotension and ultimately coded requiring CPR, ROSC was initially obtained after patient was intubated however due to his profound weakness and likely severe cardiac disease he had recurrent codes throughout the morning of 02/06/21.  See PCCM documentation and code note for further details.  Ultimately patient was pronounced at 11:56 AM on 2021-02-06 after multiple rounds of CPR and further discussion with family.   Time: 8867 2021-02-06  Signed:  Little Ishikawa DO  [facility] 2021/02/07, 3:46 PM

## 2021-02-06 NOTE — Anesthesia Procedure Notes (Signed)
Procedure Name: Intubation Date/Time: Feb 11, 2021 6:50 AM Performed by: Wilburn Cornelia, CRNA Pre-anesthesia Checklist: Patient identified, Emergency Drugs available, Suction available, Patient being monitored and Timeout performed Patient Re-evaluated:Patient Re-evaluated prior to induction Oxygen Delivery Method: Ambu bag Preoxygenation: Pre-oxygenation with 100% oxygen Induction Type: IV induction Grade View: Grade I Tube type: Oral Tube size: 7.5 mm Number of attempts: 2 Airway Equipment and Method: Video-laryngoscopy and Stylet Placement Confirmation: ETT inserted through vocal cords under direct vision,  positive ETCO2,  CO2 detector and breath sounds checked- equal and bilateral Secured at: 24 cm Tube secured with: Tape Dental Injury: Teeth and Oropharynx as per pre-operative assessment

## 2021-02-06 NOTE — Consult Note (Signed)
NAME:  EDUARDO HONOR, MRN:  [id number], DOB:  16-Jul-1961, LOS: 1 ADMISSION DATE:  01/29/2021, CONSULTATION DATE:  Feb 02, 2021 REFERRING MD:  Dr [place], CHIEF COMPLAINT:  IHCA, post ROSC   Brief History   60 yo black male with extensive cardiac history, incidental covid 19, s/p rosc after ~45 min in hospital cardiac arrest.   History of present illness   60 yo male with pmh cad s/p DES to LAD in 11/20, htn, afib with rvr s/p dccv x2, sacral decubitus,  h/o pea arrest who presented with recurrent NSTEMI. Cardiac cath performed 2/23 with deterioration in EF to 20 percent from 40, and multivessel CAD, CTS was consulted yesterday but this am he suffered cardiac arrest req 45 mins of acls before ROSC. Pt arrived to our ICU intubated, sedated on pressors as well.   Wife at bedside. K elevated to 5.5 yesterday, did not receive dialysis per chart review. Incidentally positive with covid but has been asymptomatic from that standpoint  Past Medical History   Past Medical History:  Diagnosis Date  . Acute ST elevation myocardial infarction (STEMI) of anterolateral wall ([place]) 10/28/2020   Culprit lesion, 99% ulcerated mid LAD at D1 -> DES PCI  . Anemia of chronic disease 01/30/2015  . Chronic HFrEF (heart failure with reduced ejection fraction) ([place]) 10/28/2020   LVEDP in Cath 30-35 mmHg --> EF 45-50% with Inseminate filling  . Coronary artery disease involving native coronary artery of native heart with unstable angina pectoris ([place]) 10/28/2020   Acute anterolateral STEMI: 3V CAD: Culprit Lesion = 99% mid LAD lesion (just after D1) - DES PCI; 99% ostial AV groove LCx; & 80% calcified napkin ring proximal RCA lesion with extensive calcification throughout the RCA. Successful PTCA and DES PCI of the LAD crossing D1 -resolute Onyx DES 2.75 mm x 18 mm postdilated to 3.1 mm. With PCI, there  . End-stage renal disease on hemodialysis ([place])   . GERD (gastroesophageal reflux disease)    takes Nexium  .  Hyperlipidemia with target LDL less than 70 10/28/2020  . Hypertension   . Pneumonia    as a child  . Presence of drug coated stent in LAD coronary artery 10/28/2020   Proximal-mid LAD 99% ->0% -> Resolute Onyx DES 2.75 mm x 18 mm (3.1 mm)  . Sleep apnea    uses c-pap 2 yrs    Significant Hospital Events   Readmitted 2/22 Cath 2/23 ihca x 45 min 2/24-> transferred to ICU  Consults:  Cards Nephrology CTS Ccm 2/24  Procedures:   2/23 LHC: multivessel disease 2/24 ett   Significant Diagnostic Tests:  2/22 cta chest: 1. No aortic dissection or aneurysm. 2. Cardiomegaly with findings of asymmetric pulmonary edema and possible superimposed pneumonia. Clinical correlation is recommended. 3. Small right pleural effusion. 4. Significant improvement in aeration of the lungs compared to the prior CT. Interval decrease in the size of the right middle lobe subpleural masslike consolidation. Attention on follow-up imaging recommended. 5. Chronic kidney disease and prior right nephrectomy. Renal osteodystrophy. 6. Colonic diverticulosis. No bowel obstruction. Normal appendix. 7. Aortic Atherosclerosis (ICD10-I70.0). 2/23 echo: LVEF 74%, RV systolic function reduced with RVSP 40.6, biatrial enlargement, MR  Micro Data:  2/22 blood: ngtd 2/22 sars2: positive  Antimicrobials:  none  Interim history/subjective:  As above  Objective   Blood pressure (!) 89/51, pulse 64, temperature 97.8 F (36.6 C), temperature source Oral, resp. rate (!) 29, height 5\' 11"  (1.803 m), weight 77.3 kg, SpO2 94 %.  Vent Mode: PRVC FiO2 (%):  [100 %] 100 % Set Rate:  [20 bmp] 20 bmp Vt Set:  [600 mL] 600 mL PEEP:  [5 cmH20] 5 cmH20   Intake/Output Summary (Last 24 hours) at 2021-02-09 0800 Last data filed at 02/09/2021 3267 Gross per 24 hour  Intake 79.05 ml  Output -  Net 79.05 ml   Filed Weights   01/09/2021 1341 01/24/2021 0507 02/09/2021 0522  Weight: 77.1 kg 77.2 kg 77.3 kg     Examination: General: sedated on vent, awake but not following commands, purposeful HEENT: NCAT, EOMI, PERRLA, MMMP Lungs: rhonchi bilaterally Cardiovascular: RRR, no m/g/r Abdomen: soft, NT,mildly distended, BS+ Extremities: + edema Skin: no rashes, warm and dry Neuro: moves all 4 extremities GU: deferred  Resolved Hospital Problem list     Assessment & Plan:  Acute hypoxic resp failure:  -in settings of cardiac arrest -titrate vent -mental status precluding sbt/extubation covid 19:  -incidental finding -prior to arrest pt was asymptomatic  In hospital cardiac arrest:  -s/p rosc -amio per cards -on levo Shock likely 2/2 cardiogenic -levo, adding vaso -epi gtt after cvc and coox -ef with dramatic change to 20% -hf consult pending -add arterial line as well ICM:  -ef 20% -coox once cvc placed CAD s/p DES to LAD -per cardiology -multivessel disease per LHC -cts pending NSTEMI:  -per cards  Esrd:  -will place hd catheter for crrt -nephrology following Hyperkalemia:  Sacral decub:  -wound following  Best practice:  Diet: npo Pain/Anxiety/Delirium protocol (if indicated): per protocol VAP protocol (if indicated): per protocol DVT prophylaxis: heparin infusion GI prophylaxis: ppi Glucose control: monitor Mobility: bedrest Code Status: full Family Communication: wife at bedside Disposition: ICU  Labs   CBC: Recent Labs  Lab 01/16/2021 1027 01/29/2021 0233 01/29/2021 1359 February 09, 2021 0147  WBC 9.6 9.6 10.7* 9.7  NEUTROABS  --  7.2  --   --   HGB 10.6* 10.8* 11.4* 10.5*  HCT 35.6* 35.2* 36.6* 34.0*  MCV 92.5 92.1 92.9 90.9  PLT 198 195 185 124    Basic Metabolic Panel: Recent Labs  Lab 01/21/2021 1027 02/02/2021 0233 01/18/2021 1359 February 09, 2021 0147  NA 139 135 136  --   K 3.7 5.1 5.5*  --   CL 95* 95* 95*  --   CO2 30 20* 15*  --   GLUCOSE 104* 92 70  --   BUN 51* 73* 85*  --   CREATININE 6.39* 8.36* 9.55*  --   CALCIUM 8.4* 7.7* 7.9*  --   MG   --  2.4  --  2.3  PHOS  --  5.8* 7.9* 8.0*   GFR: Estimated Creatinine Clearance: 9 mL/min (A) (by C-G formula based on SCr of 9.55 mg/dL (H)). Recent Labs  Lab 01/28/2021 1027 01/10/2021 1656 02/03/2021 0233 01/12/2021 1359 February 09, 2021 0147  PROCALCITON  --  0.90  --   --   --   WBC 9.6  --  9.6 10.7* 9.7    Liver Function Tests: Recent Labs  Lab 02/02/2021 0233 01/31/21 1359  AST 95*  --   ALT 142*  --   ALKPHOS 164*  --   BILITOT 1.1  --   PROT 6.4*  --   ALBUMIN 3.2* 3.2*   No results for input(s): LIPASE, AMYLASE in the last 168 hours. No results for input(s): AMMONIA in the last 168 hours.  ABG    Component Value Date/Time   PHART 7.497 (H) 11/26/2020 1827   PCO2ART 40.7 11/26/2020  1827   PO2ART 77 (L) 11/26/2020 1827   HCO3 31.6 (H) 11/26/2020 1827   TCO2 33 (H) 11/26/2020 1827   ACIDBASEDEF 0.3 11/16/2020 2200   O2SAT 96.0 11/26/2020 1827     Coagulation Profile: Recent Labs  Lab 01/17/2021 0233  INR 1.3*    Cardiac Enzymes: No results for input(s): CKTOTAL, CKMB, CKMBINDEX, TROPONINI in the last 168 hours.  HbA1C: Hgb A1c MFr Bld  Date/Time Value Ref Range Status  10/28/2020 06:04 AM 4.3 (L) 4.8 - 5.6 % Final    Comment:    (NOTE) Pre diabetes:          5.7%-6.4%  Diabetes:              >6.4%  Glycemic control for   <7.0% adults with diabetes   01/30/2015 09:09 PM 5.1 4.8 - 5.6 % Final    Comment:    (NOTE)         Pre-diabetes: 5.7 - 6.4         Diabetes: >6.4         Glycemic control for adults with diabetes: <7.0     CBG: No results for input(s): GLUCAP in the last 168 hours.  Review of Systems:   Unable to obtain 2/2 intubation   Past Medical History  He,  has a past medical history of Acute ST elevation myocardial infarction (STEMI) of anterolateral wall ([place]) (10/28/2020), Anemia of chronic disease (01/30/2015), Chronic HFrEF (heart failure with reduced ejection fraction) ([place]) (10/28/2020), Coronary artery disease involving native  coronary artery of native heart with unstable angina pectoris ([place]) (10/28/2020), End-stage renal disease on hemodialysis ([place]), GERD (gastroesophageal reflux disease), Hyperlipidemia with target LDL less than 70 (10/28/2020), Hypertension, Pneumonia, Presence of drug coated stent in LAD coronary artery (10/28/2020), and Sleep apnea.   Surgical History    Past Surgical History:  Procedure Laterality Date  . AV FISTULA PLACEMENT Left 12/22/2017   Procedure: REPAIR PSEUDOANEURYSM ARTERIOVENOUS (AV) FISTULA;  Surgeon: [name], MD;  Location: [place];  Service: Vascular;  Laterality: Left;  . BASCILIC VEIN TRANSPOSITION Left 01/05/2014   Procedure: LEFT 1ST STAGE BASCILIC VEIN TRANSPOSITION;  Surgeon: Conrad West Point, MD;  Location: [place];  Service: Vascular;  Laterality: Left;  . BASCILIC VEIN TRANSPOSITION Left 07/26/2014   Procedure: Left Arm Brachial Vein Transposition Second Stage;  Surgeon: Conrad Evangeline, MD;  Location: [place];  Service: Vascular;  Laterality: Left;  . COLONOSCOPY    . COLONOSCOPY N/A 02/02/2015   Procedure: COLONOSCOPY;  Surgeon: [name], MD;  Location: [facility] ENDOSCOPY;  Service: Endoscopy;  Laterality: N/A;  . CORONARY STENT INTERVENTION N/A 10/28/2020   Procedure: CORONARY STENT INTERVENTION;  Surgeon: Leonie Man, MD;  Location: [place] CV LAB;  Successful PTCA and DES PCI of the LAD crossing D1 -resolute Onyx DES 2.75 mm x 18 mm postdilated to 3.1 mm. -- 99% TO 0%  . CORONARY/GRAFT ACUTE MI REVASCULARIZATION N/A 10/28/2020   Procedure: Coronary/Graft Acute MI Revascularization;  Surgeon: Leonie Man, MD;  Location: [place] CV LAB;  Service: Cardiovascular;  mid LAD 99% DES PCI  . ESOPHAGOGASTRODUODENOSCOPY Left 01/31/2015   Procedure: ESOPHAGOGASTRODUODENOSCOPY (EGD);  Surgeon: [name], MD;  Location: [facility] ENDOSCOPY;  Service: Endoscopy;  Laterality: Left;  . ESOPHAGOGASTRODUODENOSCOPY N/A 11/09/2020   Procedure: ESOPHAGOGASTRODUODENOSCOPY (EGD);   Surgeon: Clarene Essex, MD;  Location: [place];  Service: Endoscopy;  Laterality: N/A;  . EVALUATION UNDER ANESTHESIA WITH HEMORRHOIDECTOMY N/A 02/26/2018   Procedure: ANORECTAL EXAM  UNDER ANESTHESIA  HEMORRHOIDECTOMY x 2  HEMORRHOIDAL LIGATION AND PEXY;  Surgeon: Kayo Boston, MD;  Location: [facility];  Service: General;  Laterality: N/A;  . GIVENS CAPSULE STUDY N/A 01/31/2015   Procedure: GIVENS CAPSULE STUDY;  Surgeon: [name], MD;  Location: [facility] ENDOSCOPY;  Service: Endoscopy;  Laterality: N/A;  . IR AV DIALY SHUNT INTRO NEEDLE/INTRAC INITIAL W/PTA/STENT/IMG LT Left 11/20/2020  . IR US GUIDE VASC ACCESS LEFT  11/20/2020  . LEFT HEART CATH AND CORONARY ANGIOGRAPHY N/A 10/28/2020   Procedure: LEFT HEART CATH AND CORONARY ANGIOGRAPHY;  Surgeon: Leonie Man, MD;  Location: [place] CV LAB;  Service: Cardiovascular; - Acute Ant-Lat STEMI-> MV CAD: Culprit = 99% mLAD (just after major D1) => DES PCI; 99% ostial AVG LCx (poor PCI target), 80% prox RCA napkin ring lesion.  EF ~40-45% (anterior - ant-lat HK). LVEDP 30 mmHg. (Used AL1 Guide for both LCA & RCA).  . LEFT HEART CATH AND CORONARY ANGIOGRAPHY N/A 01/15/2021   Procedure: LEFT HEART CATH AND CORONARY ANGIOGRAPHY;  Surgeon: Wellington Hampshire, MD;  Location: [place] CV LAB;  Service: Cardiovascular;  Laterality: N/A;  . MOUTH SURGERY     teeth cleaning  . NEPHRECTOMY RADICAL Right 05/2015   [facility] Dr [name] for R renal mass  . TRANSESOPHAGEAL ECHOCARDIOGRAM  11/01/2020   EF 35-40% moderate decreased function. Severe LA dilation. No LAA thrombus. Moderate RA dilation. 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 Mild dilation of ascending aorta measuring 40 mm. Moderate grade 3 plaque..  . TRANSTHORACIC ECHOCARDIOGRAM  10/31/2020   -POST ANT STEMI (& CARDIAC ARREST) -  EF 35 to 40%.  Moderate decreased function. Moderate concentric LVH GR 3 DD. Severe HK of the mid apical inferolateral and inferior wall with moderate HK of the apical septal and anterior wall. PA pressure estimated 43 mmHg. Severe LA dilation. Moderate RA dilation.Moderate to severe MR.Aortic root dilated at 41 mm. RV PSP 15 mmHg.  Marland Kitchen TRANSTHORACIC ECHOCARDIOGRAM  11/26/2020   EF 45 to 50% with mildly decreased function - mid inferoseptal, apical septal & apical wall hypokinesis. . Moderate concentric LVH and indeterminate filling. Mild to moderate MR due to restricted PMV L with mitral annular calcification III B.     Social History   reports that he quit smoking about 8 years ago. His smoking use included cigars. He quit after 4.00 years of use. He has never used smokeless tobacco. He reports that he does not drink alcohol and does not use drugs.   Family History   His family history includes Hypertension in his father, mother, and sister.   Allergies Allergies  Allergen Reactions  . Lisinopril Swelling    PT states he is allergic to all prils; caused facial swelling  . Ibuprofen Hives  . Naproxen Hives and Other (See Comments)    Alleve causes patient to have hives     Home Medications  Prior to Admission medications   Medication Sig Start Date End Date Taking? Authorizing Provider  acetaminophen (TYLENOL) 325 MG tablet Take 650 mg by mouth every 6 (six) hours as needed for mild pain, fever or headache.   Yes [provider]  amiodarone (PACERONE) 200 MG tablet Take 1 tablet (200 mg total) by mouth daily. 01/13/21 03/14/21 Yes Strader, Fransisco Hertz, PA-C  atorvastatin (LIPITOR) 80 MG tablet Take 1 tablet (80 mg total) by mouth daily at 6  PM. 01/13/21 02/12/21 Yes Strader, Fransisco Hertz, PA-C  AURYXIA 1 GM 210 MG(Fe) tablet Take 210-630 mg by mouth See admin instructions. 630mg  three times daily with meals and 210-420mg  with snacks 10/03/17  Yes [provider]  cinacalcet (SENSIPAR) 60 MG  tablet Take 120 mg by mouth daily. 10/14/20  Yes [provider]  heparin sodium, porcine, 1000 UNIT/ML injection Heparin (Porcine) Preserv Free 1,000 Units/mL Systemic 12/19/20 12/18/21 Yes [provider]  metoprolol tartrate (LOPRESSOR) 25 MG tablet Take 1 tablet (25 mg total) by mouth 2 (two) times daily. 01/24/21 02/23/21 Yes Leonie Man, MD  midodrine (PROAMATINE) 5 MG tablet Take 5 mg by mouth 3 (three) times daily with meals. On dialysis days take 10mg  before as the first dose, and then 5mg  for the second and third dose of the day. 12/14/20  Yes [provider]  multivitamin (RENA-VIT) TABS tablet Take 1 tablet by mouth daily. 10/13/20  Yes [provider]  multivitamin-iron-minerals-folic acid (CENTRUM) chewable tablet Chew 1 tablet by mouth daily.   Yes [provider]  pantoprazole (PROTONIX) 40 MG tablet Take 1 tablet (40 mg total) by mouth 2 (two) times daily. Patient taking differently: Take 40 mg by mouth daily. 12/14/20 01/13/21 Yes Darliss Cheney, MD  ticagrelor (BRILINTA) 90 MG TABS tablet Take 1 tablet (90 mg total) by mouth 2 (two) times daily. 01/13/21 03/14/21 Yes Dudley, Fransisco Hertz, PA-C     Critical care time: The patient is critically ill with multiple organ systems failure and requires high complexity decision making for assessment and support, frequent evaluation and titration of therapies, application of advanced monitoring technologies and extensive interpretation of multiple databases.  Critical care time 42 mins. This represents my time independent of the NP's/PA's/med students/residents time taking care of the pt. This is excluding procedures.     Audria Nine DO [facility] 26-Feb-2021, 8:00 AM See Amion for pager If no response to pager, please call [phone] until 1900 After 1900 please call [facility] [phone]

## 2021-02-06 NOTE — Procedures (Signed)
Central Venous Catheter Insertion Procedure Note  Travis [name]  [id number]  1961-04-04  Date:2021/02/20  Time:9:53 AM   Provider Performing:Mailani Degroote Ruthann [facility]   Procedure: Insertion of Non-tunneled Central Venous [phone]) with US guidance (12751)   Indication(s) Medication administration  Consent Risks of the procedure as well as the alternatives and risks of each were explained to the patient and/or caregiver.  Consent for the procedure was obtained and is signed in the bedside chart  Anesthesia continuous  Timeout Verified patient identification, verified procedure, site/side was marked, verified correct patient position, special equipment/implants available, medications/allergies/relevant history reviewed, required imaging and test results available.  Sterile Technique Maximal sterile technique including full sterile barrier drape, hand hygiene, sterile gown, sterile gloves, mask, hair covering, sterile ultrasound probe cover (if used).  Procedure Description Area of catheter insertion was cleaned with chlorhexidine and draped in sterile fashion.  With real-time ultrasound guidance a central venous catheter was placed into the left internal jugular vein. Nonpulsatile blood flow and easy flushing noted in all ports.  The catheter was sutured in place and sterile dressing applied.  Complications/Tolerance None; patient tolerated the procedure well. Chest X-ray is ordered to verify placement for internal jugular or subclavian cannulation.   Chest x-ray is not ordered for femoral cannulation.  EBL Minimal  Specimen(s) None   Line was done under u/s guidance. Easily compressible vein noted with neighboring pulsatile artery. Upon stick dark red non pulsatile blood was noted. Wire easily advanced. Wire was verified to be in easily compressible/non pulsatile vessel in both cross sectional and longitudinal views under ultrasound guidance. Skin was easily dilated and  catheter advanced without issue. Wire was withdrawn from vessel. No air was aspirated thru entirety of the procedure. Pt tolerated well. No complications were appreciated.

## 2021-02-06 NOTE — Progress Notes (Signed)
Patient belongings picked up from [place] were given to family at bedside.

## 2021-02-06 NOTE — Progress Notes (Signed)
Pt called this RN into room stating he was short of breath and feeling anxious. Pt was working to breath and was diaphoretic. This RN asked pt if he was having any pain he replied he was hurting all over. This RN obtained an EKG, paged on call Hospitalist and requested Rapid Response to come to bedside. While waiting for RR patient stated he wasn't feeling well and was going to pass out. Pt's HR slowed and pt became unresponsive. Code Blue was initiated

## 2021-02-06 NOTE — Progress Notes (Signed)
Hypoglycemic Event  CBG: 50  Treatment: D50 50 mL (25 gm)  Symptoms: None  Follow-up CBG: Time:1053 CBG Result:89  Possible Reasons for Event: Unknown   Walker Shadow

## 2021-02-06 NOTE — Progress Notes (Addendum)
Patient pronounced dead by Burna Mortimer, RN and Dorena Cookey, RN at [phone] am.  Family at bedside during entire process.

## 2021-02-06 NOTE — Procedures (Signed)
Arterial Catheter Insertion Procedure Note  Travis [name]  [id number]  January 08, 1961  Date:02/19/21  Time:9:54 AM    Provider Performing: Audria Nine    Procedure: Insertion of Arterial Line [phone]) with US guidance ([id number])   Indication(s) Blood pressure monitoring and/or need for frequent ABGs  Consent Risks of the procedure as well as the alternatives and risks of each were explained to the patient and/or caregiver.  Consent for the procedure was obtained and is signed in the bedside chart  Anesthesia see mar   Time Out Verified patient identification, verified procedure, site/side was marked, verified correct patient position, special equipment/implants available, medications/allergies/relevant history reviewed, required imaging and test results available.   Sterile Technique Maximal sterile technique including full sterile barrier drape, hand hygiene, sterile gown, sterile gloves, mask, hair covering, sterile ultrasound probe cover (if used).   Procedure Description Area of catheter insertion was cleaned with chlorhexidine and draped in sterile fashion. With real-time ultrasound guidance an arterial catheter was placed into the right radial artery.  Appropriate arterial tracings confirmed on monitor.     Complications/Tolerance None; patient tolerated the procedure well.   EBL Minimal   Specimen(s) None

## 2021-02-06 NOTE — Code Documentation (Signed)
  Patient Name: Travis [name]   MRN: [id number]   Date of Birth/ Sex: [birth year] , male      Admission Date: 01/21/2021  Attending Provider: Little Ishikawa, MD  Primary Diagnosis: NSTEMI (non-ST elevated myocardial infarction) [facility])   Indication: Pt was in his usual state of health until this AM, when he reported chest pain to his RN, found to be diaphoretic and bradycardic, and had subsequent cardiac arrest. Code blue was subsequently called at [phone]. At the time of arrival on scene, ACLS protocol was underway. ROSC was achieved, followed by repeated arrest with code blue called again at 0702.   Technical Description:  - CPR performance duration:  40 minutes  - Was defibrillation or cardioversion used? Yes   - Was external pacer placed? Yes  - Was patient intubated pre/post CPR? Yes   Medications Administered: Y = Yes; Blank = No Amiodarone  Y  Atropine    Calcium  Y  Epinephrine  Y  Lidocaine  Y  Magnesium  Y  Norepinephrine    Phenylephrine    Sodium bicarbonate  Y  Vasopressin     Post CPR evaluation:  - Final Status - Was patient successfully resuscitated ? Yes  Miscellaneous Information:  - Labs sent, including: CBC, BMP  - Primary team notified?  Yes  - Family Notified? Yes  - Additional notes/ transfer status:  Transfer to ICU, [facility]03     Alexandria Lodge, MD  05-Feb-2021, 7:32 AM

## 2021-02-06 NOTE — Progress Notes (Signed)
[place] for heparin Indication: ACS/STEMI  Labs: Recent Labs    01/13/2021 1027 01/25/2021 1247 01/22/2021 1920 01/26/2021 2237 01/22/2021 0233 01/17/2021 0822 01/10/2021 1359 2021/02/16 0147  HGB 10.6*  --   --   --  10.8*  --  11.4* 10.5*  HCT 35.6*  --   --   --  35.2*  --  36.6* 34.0*  PLT 198  --   --   --  195  --  185 191  APTT  --   --   --   --  49*  --   --   --   LABPROT  --   --   --   --  15.8*  --   --   --   INR  --   --   --   --  1.3*  --   --   --   HEPARINUNFRC  --   --   --  0.72*  --  0.10*  --  <0.10*  CREATININE 6.39*  --   --   --  8.36*  --  9.55*  --   TROPONINIHS 1,940* 2,324* 4,219*  --   --   --   --   --     Assessment: 60 yo admitted for chest pain x 2 days that radiates into upper back.  Hx of CAD w/ MI last November with DES placed.  On Brilinta PTA.  Cardiology consulted to rule out NSTEMI/ACS given PMH.  Troponins 1940, CBC stable. Pt is COVID positive.  S/p cath today 2/23, coronary arteries not favorable for PCI. TCTS consulted for CABG evaluation (Brilinta discontinued). Pharmacy to restart heparin gtt 8h after sheath removal (removed @0946 ). Heparin gtt previously running at 950 units/hr with initial heparin level 0.72 units/mL (level drawn from this morning was drawn after heparin gtt turned off for cath). Hgb stable today, plt wnl.   2/24 AM update:  Heparin off for at least 30 minutes prior to lab drawn No changes for now  Goal of Therapy:  Heparin level 0.3-0.7 units/ml Monitor platelets by anticoagulation protocol: Yes   Plan:  Cont heparin at 950 units/hr for now due to off time Re-check heparin level at [place], PharmD, [place] Pharmacist Phone: [phone]

## 2021-02-06 NOTE — Consult Note (Signed)
Advanced Heart Failure Team Consult Note   Primary Physician: Lujean Amel, MD PCP-Cardiologist:  Glenetta Hew, MD  Reason for Consultation: acute on chronic systolic heart failure 2/2 ischemic CM/ post cardiac arrest  HPI:    Travis [name] is seen today for evaluation of acute on chronic systolic heart failure 2/2 ischemic CM/ post cardiac arrest at the request of Dr. Martinique, Cardiology.   60 y/o AAM w/ recent prolonged hospitalization from 10/28/20-12/14/20, initially admitted for anterior STEMI and underwent primary PCI to mLAD. Also had severe LCx and RCA disease, w/ initial plans for staged PCI however unable to peruse due to other multiple acute medical problems.  Hospital course was further c/b septic shock due to MSSA bacteremia, Afib w/ RVR,  brief PEA arrest requiring CPR, acute hypoxic respiratory failure requiring intubation, upper GI bleed with acute blood loss anemia due to severe esophagitis requiring about 14 units of PRBC transfusion, severe metabolic encephalopathy/ICU delirium and bilateral lung abscesses with empyema requiring chest tube insertion. Also w/ ESRD on HD.    Initial echo 11/23 showed EF 35-40%. RV normal. Had repeat limited echo 12/19 and LVEF was up to 45-50%.   Now readmitted for NSTEMI. Hs Trop 4,219. Found to be COVID +. Repeat LHC yesterday showed severe heavily calcified three-vessel coronary artery disease.  The LAD stent which was placed in November is occluded.  In addition, there is significant progression of distal RCA disease as well as proximal left circumflex and first diagonal disease. Coronary disease not favorable for PCI. CT surgery consulted for potential CABG. Echo now shows EF down to 20-25%. RV moderately reduced.   Unfortunately, earlier this am, pt suffered cardiac arrest w/ prolonged CPR ~45 min. Initially bradycardic but then with multiple VT runs. Now intubated and on amio gtt. On high pressor support, NE 70 + VP 0.03. AF. WBC  9.7. PCT on admit was 0.90. K was mildly elevated at 5.5 last PM. SCr 9.55/ BUN 85. Nephrology following. Pt on CRRT currently.   AHF team asked to evaluate.   LHC 01/22/2021 Conclusion    1st Diag lesion is 90% stenosed.  Balloon angioplasty was performed.  LPAV lesion is 99% stenosed.  Prox RCA lesion is 80% stenosed.  Dist RCA lesion is 90% stenosed.  RPDA lesion is 70% stenosed.  Ost Cx to Prox Cx lesion is 80% stenosed.  Mid LAD lesion is 100% stenosed.  Prox LAD lesion is 40% stenosed.   1.  Severe heavily calcified three-vessel coronary artery disease.  The LAD stent which was placed in November is occluded.  In addition, there is significant progression of distal RCA disease as well as proximal left circumflex and first diagonal disease. 2.  Mildly elevated left ventricular end-diastolic pressure at 22 mmHg.  Left ventricular angiography was not performed.  have been documented.  Coronary Diagrams   Diagnostic Dominance: Right    Intervention- none      Echo 01/23/2021 Left ventricular ejection fraction, by estimation, is 20 to 25%. The left ventricle has severely decreased function. The left ventricle demonstrates regional wall motion abnormalities (see scoring diagram/findings for description). The left ventricular internal cavity size was moderately to severely dilated. There is severe left ventricular hypertrophy of the basalseptal segment. Left ventricular diastolic function could not be evaluated. Elevated left ventricular end-diastolic pressure. There is akinesis of the left ventricular, mid inferoseptal wall and anteroseptal wall. There is akinesis of the left ventricular, apical septal wall, lateral wall, anterior wall, inferior wall  and inferolateral wall. There is akinesis of the left ventricular, entire apical segment. 2. Right ventricular systolic function is moderately reduced. The right ventricular size is normal. There is mildly elevated  pulmonary artery systolic pressure. The estimated right ventricular systolic pressure is 48.1 mmHg. 3. Left atrial size was severely dilated. 4. Right atrial size was severely dilated. 5. The mitral valve is degenerative. Moderate mitral valve regurgitation. No evidence of mitral stenosis. 6. Tricuspid valve regurgitation is mild to moderate. 7. The aortic valve is abnormal. There is moderate thickening of the aortic valve. Aortic valve regurgitation is moderate. Mild to moderate aortic valve sclerosis/calcification is present, without any evidence of aortic stenosis. Aortic regurgitation PHT measures 313 msec. 8. Aortic dilatation noted. There is mild dilatation of the ascending aorta, measuring 41 mm. 9. The inferior vena cava is dilated in size with <50% respiratory variability, suggesting right atrial pressure of 15 mmHg.  Review of Systems: [y] = yes, [ ]  = no   . General: Weight gain [ ] ; Weight loss [ ] ; Anorexia [ ] ; Fatigue [ ] ; Fever [ ] ; Chills [ ] ; Weakness [ ]   . Cardiac: Chest pain/pressure [ ] ; Resting SOB [ ] ; Exertional SOB [ ] ; Orthopnea [ ] ; Pedal Edema [ ] ; Palpitations [ ] ; Syncope [ ] ; Presyncope [ ] ; Paroxysmal nocturnal dyspnea[ ]   . Pulmonary: Cough [ ] ; Wheezing[ ] ; Hemoptysis[ ] ; Sputum [ ] ; Snoring [ ]   . GI: Vomiting[ ] ; Dysphagia[ ] ; Melena[ ] ; Hematochezia [ ] ; Heartburn[ ] ; Abdominal pain [ ] ; Constipation [ ] ; Diarrhea [ ] ; BRBPR [ ]   . GU: Hematuria[ ] ; Dysuria [ ] ; Nocturia[ ]   . Vascular: Pain in legs with walking [ ] ; Pain in feet with lying flat [ ] ; Non-healing sores [ ] ; Stroke [ ] ; TIA [ ] ; Slurred speech [ ] ;  . Neuro: Headaches[ ] ; Vertigo[ ] ; Seizures[ ] ; Paresthesias[ ] ;Blurred vision [ ] ; Diplopia [ ] ; Vision changes [ ]   . Ortho/Skin: Arthritis [ ] ; Joint pain [ ] ; Muscle pain [ ] ; Joint swelling [ ] ; Back Pain [ ] ; Rash [ ]   . Psych: Depression[ ] ; Anxiety[ ]   . Heme: Bleeding problems [ ] ; Clotting disorders [ ] ; Anemia [ ]   . Endocrine:  Diabetes [ ] ; Thyroid dysfunction[ ]   Home Medications Prior to Admission medications   Medication Sig Start Date End Date Taking? Authorizing Provider  acetaminophen (TYLENOL) 325 MG tablet Take 650 mg by mouth every 6 (six) hours as needed for mild pain, fever or headache.   Yes [provider]  amiodarone (PACERONE) 200 MG tablet Take 1 tablet (200 mg total) by mouth daily. 01/13/21 03/14/21 Yes Strader, Fransisco Hertz, PA-C  atorvastatin (LIPITOR) 80 MG tablet Take 1 tablet (80 mg total) by mouth daily at 6 PM. 01/13/21 02/12/21 Yes Strader, Fransisco Hertz, PA-C  AURYXIA 1 GM 210 MG(Fe) tablet Take 210-630 mg by mouth See admin instructions. 630mg  three times daily with meals and 210-420mg  with snacks 10/03/17  Yes [provider]  cinacalcet (SENSIPAR) 60 MG tablet Take 120 mg by mouth daily. 10/14/20  Yes [provider]  heparin sodium, porcine, 1000 UNIT/ML injection Heparin (Porcine) Preserv Free 1,000 Units/mL Systemic 12/19/20 12/18/21 Yes [provider]  metoprolol tartrate (LOPRESSOR) 25 MG tablet Take 1 tablet (25 mg total) by mouth 2 (two) times daily. 01/24/21 02/23/21 Yes Leonie Man, MD  midodrine (PROAMATINE) 5 MG tablet Take 5 mg by mouth 3 (three) times daily with meals. On dialysis days  take 10mg  before as the first dose, and then 5mg  for the second and third dose of the day. 12/14/20  Yes [provider]  multivitamin (RENA-VIT) TABS tablet Take 1 tablet by mouth daily. 10/13/20  Yes [provider]  multivitamin-iron-minerals-folic acid (CENTRUM) chewable tablet Chew 1 tablet by mouth daily.   Yes [provider]  pantoprazole (PROTONIX) 40 MG tablet Take 1 tablet (40 mg total) by mouth 2 (two) times daily. Patient taking differently: Take 40 mg by mouth daily. 12/14/20 01/13/21 Yes Darliss Cheney, MD  ticagrelor (BRILINTA) 90 MG TABS tablet Take 1 tablet (90 mg total) by mouth 2 (two) times daily. 01/13/21 03/14/21 Yes 71, Fransisco Hertz,  PA-C    Past Medical History: Past Medical History:  Diagnosis Date  . Acute ST elevation myocardial infarction (STEMI) of anterolateral wall ([place]) 10/28/2020   Culprit lesion, 99% ulcerated mid LAD at D1 -> DES PCI  . Anemia of chronic disease 01/30/2015  . Chronic HFrEF (heart failure with reduced ejection fraction) ([place]) 10/28/2020   LVEDP in Cath 30-35 mmHg --> EF 45-50% with Inseminate filling  . Coronary artery disease involving native coronary artery of native heart with unstable angina pectoris ([place]) 10/28/2020   Acute anterolateral STEMI: 3V CAD: Culprit Lesion = 99% mid LAD lesion (just after D1) - DES PCI; 99% ostial AV groove LCx; & 80% calcified napkin ring proximal RCA lesion with extensive calcification throughout the RCA. Successful PTCA and DES PCI of the LAD crossing D1 -resolute Onyx DES 2.75 mm x 18 mm postdilated to 3.1 mm. With PCI, there  . End-stage renal disease on hemodialysis ([place])   . GERD (gastroesophageal reflux disease)    takes Nexium  . Hyperlipidemia with target LDL less than 70 10/28/2020  . Hypertension   . Pneumonia    as a child  . Presence of drug coated stent in LAD coronary artery 10/28/2020   Proximal-mid LAD 99% ->0% -> Resolute Onyx DES 2.75 mm x 18 mm (3.1 mm)  . Sleep apnea    uses c-pap 2 yrs    Past Surgical History: Past Surgical History:  Procedure Laterality Date  . AV FISTULA PLACEMENT Left 12/22/2017   Procedure: REPAIR PSEUDOANEURYSM ARTERIOVENOUS (AV) FISTULA;  Surgeon: [name], MD;  Location: [facility];  Service: Vascular;  Laterality: Left;  . BASCILIC VEIN TRANSPOSITION Left 01/05/2014   Procedure: LEFT 1ST STAGE BASCILIC VEIN TRANSPOSITION;  Surgeon: Conrad [place], MD;  Location: [facility];  Service: Vascular;  Laterality: Left;  . BASCILIC VEIN TRANSPOSITION Left 07/26/2014   Procedure: Left Arm Brachial Vein Transposition Second Stage;  Surgeon: Conrad [place], MD;  Location: [facility];  Service: Vascular;  Laterality: Left;  .  COLONOSCOPY    . COLONOSCOPY N/A 02/02/2015   Procedure: COLONOSCOPY;  Surgeon: [name], MD;  Location: [facility] ENDOSCOPY;  Service: Endoscopy;  Laterality: N/A;  . CORONARY STENT INTERVENTION N/A 10/28/2020   Procedure: CORONARY STENT INTERVENTION;  Surgeon: Leonie Man, MD;  Location: [place] CV LAB;  Successful PTCA and DES PCI of the LAD crossing D1 -resolute Onyx DES 2.75 mm x 18 mm postdilated to 3.1 mm. -- 99% TO 0%  . CORONARY/GRAFT ACUTE MI REVASCULARIZATION N/A 10/28/2020   Procedure: Coronary/Graft Acute MI Revascularization;  Surgeon: Leonie Man, MD;  Location: [place] [facility];  Service: Cardiovascular;  mid LAD 99% DES PCI  . ESOPHAGOGASTRODUODENOSCOPY Left 01/31/2015   Procedure: ESOPHAGOGASTRODUODENOSCOPY (EGD);  Surgeon: [name], MD;  Location: [facility]  ENDOSCOPY;  Service: Endoscopy;  Laterality: Left;  . ESOPHAGOGASTRODUODENOSCOPY N/A 11/09/2020   Procedure: ESOPHAGOGASTRODUODENOSCOPY (EGD);  Surgeon: Clarene Essex, MD;  Location: [place];  Service: Endoscopy;  Laterality: N/A;  . EVALUATION UNDER ANESTHESIA WITH HEMORRHOIDECTOMY N/A 02/26/2018   Procedure: ANORECTAL EXAM UNDER ANESTHESIA  HEMORRHOIDECTOMY x 2  HEMORRHOIDAL LIGATION AND PEXY;  Surgeon: Luiz Boston, MD;  Location: [facility];  Service: General;  Laterality: N/A;  . GIVENS CAPSULE STUDY N/A 01/31/2015   Procedure: GIVENS CAPSULE STUDY;  Surgeon: [name], MD;  Location: [facility] ENDOSCOPY;  Service: Endoscopy;  Laterality: N/A;  . IR AV DIALY SHUNT INTRO NEEDLE/INTRAC INITIAL W/PTA/STENT/IMG LT Left 11/20/2020  . IR US GUIDE VASC ACCESS LEFT  11/20/2020  . LEFT HEART CATH AND CORONARY ANGIOGRAPHY N/A 10/28/2020   Procedure: LEFT HEART CATH AND CORONARY ANGIOGRAPHY;  Surgeon: Leonie Man, MD;  Location: [place] CV LAB;  Service: Cardiovascular; - Acute Ant-Lat STEMI-> MV CAD: Culprit = 99% mLAD (just after major D1) => DES PCI; 99% ostial AVG LCx (poor PCI target), 80% prox RCA napkin ring  lesion.  EF ~40-45% (anterior - ant-lat HK). LVEDP 30 mmHg. (Used AL1 Guide for both LCA & RCA).  . LEFT HEART CATH AND CORONARY ANGIOGRAPHY N/A 01/23/2021   Procedure: LEFT HEART CATH AND CORONARY ANGIOGRAPHY;  Surgeon: [place] Hampshire, MD;  Location: [place] CV LAB;  Service: Cardiovascular;  Laterality: N/A;  . MOUTH SURGERY     teeth cleaning  . NEPHRECTOMY RADICAL Right 05/2015   [facility] Dr [name] for R renal mass  . TRANSESOPHAGEAL ECHOCARDIOGRAM  11/01/2020   EF 35-40% moderate decreased function. Severe LA dilation. No LAA thrombus. Moderate RA dilation. 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 Mild dilation of ascending aorta measuring 40 mm. Moderate grade 3 plaque..  . TRANSTHORACIC ECHOCARDIOGRAM  10/31/2020   -POST ANT STEMI (& CARDIAC ARREST) -  EF 35 to 40%. Moderate decreased function. Moderate concentric LVH GR 3 DD. Severe HK of the mid apical inferolateral and inferior wall with moderate HK of the apical septal and anterior wall. PA pressure estimated 43 mmHg. Severe LA dilation. Moderate RA dilation.Moderate to severe MR.Aortic root dilated at 41 mm. RV PSP 15 mmHg.  Marland Kitchen TRANSTHORACIC ECHOCARDIOGRAM  11/26/2020   EF 45 to 50% with mildly decreased function - mid inferoseptal, apical septal & apical wall hypokinesis. . Moderate concentric LVH and indeterminate filling. Mild to moderate MR due to restricted PMV L with mitral annular calcification III B.    Family History: Family History  Problem Relation Age of Onset  . Hypertension Mother   . Hypertension Father   . Hypertension Sister     Social History: Social History   Socioeconomic History  . Marital status: Married    Spouse name: Not on file  . Number of children: Not on file  . Years of education: Not on file  . Highest education level: Not on file   Occupational History  . Not on file  Tobacco Use  . Smoking status: Former Smoker    Years: 4.00    Types: Cigars    Quit date: 12/31/2012    Years since quitting: 8.0  . Smokeless tobacco: Never Used  Vaping Use  . Vaping Use: Never used  Substance and Sexual Activity  . Alcohol use: No  . Drug use: No  . Sexual activity: Not on file  Other Topics Concern  . Not on file  Social History Narrative  . Not on file   Social Determinants of Health   Financial Resource Strain: Not on file  Food Insecurity: Not on file  Transportation Needs: Not on file  Physical Activity: Not on file  Stress: Not on file  Social Connections: Not on file    Allergies:  Allergies  Allergen Reactions  . Lisinopril Swelling    PT states he is allergic to all prils; caused facial swelling  . Ibuprofen Hives  . Naproxen Hives and Other (See Comments)    Alleve causes patient to have hives    Objective:    Vital Signs:   Temp:  [97.8 F (36.6 C)-98.1 F (36.7 C)] 98.1 F (36.7 C) (02/24 0801) Pulse Rate:  [57-77] 58 (02/24 0812) Resp:  [14-70] 22 (02/24 0816) BP: (70-114)/(50-98) 97/58 (02/24 0816) SpO2:  [74 %-100 %] 87 % (02/24 0812) FiO2 (%):  [100 %] 100 % (02/24 0801) Weight:  [77.3 kg] 77.3 kg (02/24 0522) Last BM Date: 01/20/2021  Weight change: Filed Weights   01/19/2021 1341 01/16/2021 0507 Mar 03, 2021 0522  Weight: 77.1 kg 77.2 kg 77.3 kg    Intake/Output:   Intake/Output Summary (Last 24 hours) at Mar 03, 2021 0930 Last data filed at 03-Mar-2021 0800 Gross per 24 hour  Intake 124.64 ml  Output -  Net 124.64 ml      Physical Exam    General: intubated and sedated, critically ill   HEENT: + ETT Neck: supple. JVP . Carotids 2+ bilat; no bruits. No lymphadenopathy or thyromegaly appreciated. + Rt IJ HD cath  Cor: PMI nondisplaced. Regular rate & rhythm. No rubs, gallops or murmurs. Lungs: intubated, clear  Abdomen: soft, nontender, nondistended. No hepatosplenomegaly. No  bruits or masses. Good bowel sounds. Extremities: no cyanosis, clubbing, rash, edema Neuro: intubated and sedated  Telemetry   NSR   EKG    Sinus rhythm with 1st degree A-V block, Non-specific intra-ventricular conduction block. 65 bpm   Labs   Basic Metabolic Panel: Recent Labs  Lab 01/27/2021 1027 01/21/2021 0233 01/23/2021 1359 03/03/2021 0147  NA 139 135 136  --   K 3.7 5.1 5.5*  --   CL 95* 95* 95*  --   CO2 30 20* 15*  --   GLUCOSE 104* 92 70  --   BUN 51* 73* 85*  --   CREATININE 6.39* 8.36* 9.55*  --   CALCIUM 8.4* 7.7* 7.9*  --   MG  --  2.4  --  2.3  PHOS  --  5.8* 7.9* 8.0*    Liver Function Tests: Recent Labs  Lab 01/29/2021 0233 01/25/2021 1359  AST 95*  --   ALT 142*  --   ALKPHOS 164*  --   BILITOT 1.1  --   PROT 6.4*  --   ALBUMIN 3.2* 3.2*   No results for input(s): LIPASE, AMYLASE in the last 168 hours. No results for input(s): AMMONIA in the last 168 hours.  CBC: Recent Labs  Lab 01/29/2021 1027 01/18/2021 0233 02/02/2021 1359 2021/03/03 0147  WBC 9.6 9.6 10.7* 9.7  NEUTROABS  --  7.2  --   --   HGB 10.6* 10.8* 11.4* 10.5*  HCT 35.6* 35.2* 36.6* 34.0*  MCV 92.5 92.1 92.9 90.9  PLT 198 195 185 191    Cardiac Enzymes: No results for input(s): CKTOTAL, CKMB, CKMBINDEX, TROPONINI in the last 168 hours.  BNP: BNP (last 3 results) No results for input(s):  BNP in the last 8760 hours.  ProBNP (last 3 results) No results for input(s): PROBNP in the last 8760 hours.   CBG: No results for input(s): GLUCAP in the last 168 hours.  Coagulation Studies: Recent Labs    02/03/2021 0233  LABPROT 15.8*  INR 1.3*     Imaging   ECHOCARDIOGRAM COMPLETE  Result Date: 02/05/2021    ECHOCARDIOGRAM REPORT   Patient Name:   Travis [name] Date of Exam: 01/25/2021 Medical Rec #:  [id number]         Height:       71.0 in Accession #:    [id number]        Weight:       170.1 lb Date of Birth:  [birth year]        BSA:          1.968 m Patient Age:    48 years           BP:           91/64 mmHg Patient Gender: M                 HR:           79 bpm. Exam Location:  Inpatient Procedure: 2D Echo, 3D Echo, Cardiac Doppler and Color Doppler Indications:    Acute Myocardial Infarction I21.9  History:        Patient has prior history of Echocardiogram examinations, most                 recent 11/26/2020. CHF, CAD; Risk Factors:Hypertension,                 Dyslipidemia and Sleep Apnea. COVID - 19 Positive. ESRD. GERD.  Sonographer:    Tiffany Dance Referring Phys: [place]  1. Left ventricular ejection fraction, by estimation, is 20 to 25%. The left ventricle has severely decreased function. The left ventricle demonstrates regional wall motion abnormalities (see scoring diagram/findings for description). The left ventricular internal cavity size was moderately to severely dilated. There is severe left ventricular hypertrophy of the basal-septal segment. Left ventricular diastolic function could not be evaluated. Elevated left ventricular end-diastolic pressure. There is akinesis of the left ventricular, mid inferoseptal wall and anteroseptal wall. There is akinesis of the left ventricular, apical septal wall, lateral wall, anterior wall, inferior wall and inferolateral wall. There is akinesis of the left ventricular, entire apical segment.  2. Right ventricular systolic function is moderately reduced. The right ventricular size is normal. There is mildly elevated pulmonary artery systolic pressure. The estimated right ventricular systolic pressure is 37.1 mmHg.  3. Left atrial size was severely dilated.  4. Right atrial size was severely dilated.  5. The mitral valve is degenerative. Moderate mitral valve regurgitation. No evidence of mitral stenosis.  6. Tricuspid valve regurgitation is mild to moderate.  7. The aortic valve is abnormal. There is moderate thickening of the aortic valve. Aortic valve regurgitation is moderate. Mild to moderate aortic  valve sclerosis/calcification is present, without any evidence of aortic stenosis. Aortic regurgitation PHT measures 313 msec.  8. Aortic dilatation noted. There is mild dilatation of the ascending aorta, measuring 41 mm.  9. The inferior vena cava is dilated in size with <50% respiratory variability, suggesting right atrial pressure of 15 mmHg. FINDINGS  Left Ventricle: Left ventricular ejection fraction, by estimation, is 20 to 25%. The left ventricle has severely decreased function. The left ventricle demonstrates regional wall motion abnormalities.  The left ventricular internal cavity size was moderately to severely dilated. There is severe left ventricular hypertrophy of the basal-septal segment. Left ventricular diastolic function could not be evaluated. Elevated left ventricular end-diastolic pressure. Right Ventricle: The right ventricular size is normal. No increase in right ventricular wall thickness. Right ventricular systolic function is moderately reduced. There is mildly elevated pulmonary artery systolic pressure. The tricuspid regurgitant velocity is 2.53 m/s, and with an assumed right atrial pressure of 15 mmHg, the estimated right ventricular systolic pressure is 25.4 mmHg. Left Atrium: Left atrial size was severely dilated. Right Atrium: Right atrial size was severely dilated. Pericardium: There is no evidence of pericardial effusion. Mitral Valve: The mitral valve is degenerative in appearance. There is mild thickening of the mitral valve leaflet(s). Mild mitral annular calcification. Moderate mitral valve regurgitation. No evidence of mitral valve stenosis. Tricuspid Valve: The tricuspid valve is normal in structure. Tricuspid valve regurgitation is mild to moderate. No evidence of tricuspid stenosis. Aortic Valve: The aortic valve is abnormal. There is moderate thickening of the aortic valve. Aortic valve regurgitation is moderate. Aortic regurgitation PHT measures 313 msec. Mild to moderate  aortic valve sclerosis/calcification is present, without any evidence of aortic stenosis. Pulmonic Valve: The pulmonic valve was normal in structure. Pulmonic valve regurgitation is trivial. No evidence of pulmonic stenosis. Aorta: Aortic dilatation noted. There is mild dilatation of the ascending aorta, measuring 41 mm. Venous: The inferior vena cava is dilated in size with less than 50% respiratory variability, suggesting right atrial pressure of 15 mmHg. IAS/Shunts: No atrial level shunt detected by color flow Doppler.  LEFT VENTRICLE PLAX 2D LVIDd:         6.20 cm  Diastology LVIDs:         4.90 cm  LV e' medial:    4.90 cm/s LV PW:         1.20 cm  LV E/e' medial:  23.4 LV IVS:        1.93 cm  LV e' lateral:   8.70 cm/s LVOT diam:     1.80 cm  LV E/e' lateral: 13.2 LV SV:         45 LV SV Index:   23 LVOT Area:     2.54 cm  RIGHT VENTRICLE            IVC RV Basal diam:  3.30 cm    IVC diam: 2.90 cm RV Mid diam:    2.70 cm RV S prime:     7.72 cm/s TAPSE (M-mode): 1.5 cm LEFT ATRIUM              Index       RIGHT ATRIUM           Index LA diam:        6.60 cm  3.35 cm/m  RA Area:     31.40 cm LA Vol (A2C):   133.0 ml 67.57 ml/m RA Volume:   119.00 ml 60.46 ml/m LA Vol (A4C):   125.0 ml 63.51 ml/m LA Biplane Vol: 133.0 ml 67.57 ml/m  AORTIC VALVE LVOT Vmax:   99.30 cm/s LVOT Vmean:  72.000 cm/s LVOT VTI:    0.176 m AI PHT:      313 msec  AORTA Ao Root diam: 3.40 cm Ao Asc diam:  4.10 cm MITRAL VALVE                 TRICUSPID VALVE MV Area (PHT): 4.06 cm  TR Peak grad:   25.6 mmHg MV Decel Time: 187 msec      TR Vmax:        253.00 cm/s MR Peak grad:    59.0 mmHg MR Mean grad:    36.0 mmHg   SHUNTS MR Vmax:         384.00 cm/s Systemic VTI:  0.18 m MR Vmean:        279.0 cm/s  Systemic Diam: 1.80 cm MR PISA:         1.01 cm MR PISA Eff ROA: 10 mm MR PISA Radius:  0.40 cm MV E velocity: 114.50 cm/s Fransico Him MD Electronically signed by Fransico Him MD Signature Date/Time: 01/29/2021/5:25:16 PM     Final       Medications:     Current Medications: . albuterol  2 puff Inhalation Q6H  . vitamin C  500 mg Oral Daily  . atorvastatin  80 mg Oral q1800  . cinacalcet  120 mg Oral Daily  . darbepoetin (ARANESP) injection - DIALYSIS  40 mcg Intravenous Q Thu-HD  . doxercalciferol  6 mcg Intravenous Q T,Th,Sa-HD  . fentaNYL (SUBLIMAZE) injection  50 mcg Intravenous Once  . melatonin  3 mg Oral QHS  . midodrine  10 mg Oral Once per day on Tue Thu Sat  . midodrine  5 mg Oral 3 times per day on Sun Mon Wed Fri  . midodrine  5 mg Oral 2 times per day on Tue Thu Sat  . pantoprazole (PROTONIX) IV  40 mg Intravenous Q12H  . sodium chloride flush  3 mL Intravenous Q12H  . sodium chloride flush  3 mL Intravenous Q12H  . zinc sulfate  220 mg Oral Daily     Infusions: . sodium chloride    . sodium chloride    . sodium chloride    . amiodarone 60 mg/hr (03-Feb-2021 0850)   Followed by  . amiodarone    . amiodarone    . fentaNYL infusion INTRAVENOUS 50 mcg/hr (02-03-21 0855)  . heparin 950 Units/hr (2021-02-03 0800)  . norepinephrine (LEVOPHED) Adult infusion 5 mcg/min (02/03/21 0802)  . vasopressin         Assessment/Plan   1. Multivessel CAD/ NSTEMI - Anterior STEMI 11/21 w/ occluded mLAD (culprit) treated w/ PCI. Also had severe LCx and RCA disease, w/ initial plans for staged PCI however unable to peruse due to other multiple acute medical problems at the time - now readmitted w/ NSTEMI. HS trop 4,219 - Re-look LHC shows recently placed LAD stent to be occluded. In addition, there is significant progression of distal RCA disease as well as proximal left circumflex and first diagonal disease. - Unlikely to be a candidate for CABG now given recent arrest w/ prolonged resuscitation, ? meaningful neurological recovery  - medical management for now. IV heparin + statin.  - no  blocker w/ shock   2. Acute on Chronic Systolic Heart Failure>>Shock  - Ischemic CM, recent anterior STEMI  11/21 w/ cath showing multivessel CAD - EF at time of STEMI 11/21 35-40%, RV normal. Repeat limited echo 12/21 EF up to 45-50% - EF back down again at 20-25% and RV moderately reduced in setting of worsening CAD. Recently placed LAD stent now occluded - Now in shock, presumed primarily cardiogenic but ? Component of septic shock.  - on high dose NE at 70 + VP 0.03.  - Check Co-ox to help differentiate   - Set up CVP monitoring. Volume removal through  CRRT - Potential CABG if meaningful neurological recovery and if able to stabilize     3. Cardiac Arrest  - prolonged resuccitation effort ~45 min  - initially bradycardic but then with multiple VT runs.  - last K 5.5, now on CRRT - post arrest care per PCCM   4. End Stage CKD - usual iHD schedule MWF - CRRT currently w/ acute critical illness w/ high pressor requirements - management per nephrology   5. Acute Hypoxic Respiratory Failure - intubated, vent management per PCCM  - PCT 0.90 on admit, defer abx to primary team   6. COVID +  - intubated, though ARF multifactorial  - management per PCCM   7. VT - keep electrolytes stable through CRRT - Continue amiodarone gtt at 30/hr   Length of Stay: 1  Brittainy Simmons, PA-C  2021-02-25, 9:30 AM  Advanced Heart Failure Team Pager [phone] (M-F; [place])  Please contact [facility] for night-coverage after hours (4p -7a ) and weekends on [email or web address]

## 2021-02-06 NOTE — Progress Notes (Signed)
Travis [name]  Subjective: pt had prolonged cardiac arrest this am. On vent now in ICU.  Had [facility] [place] yesterday showing progression/ severe CAD, plan was for TCTS consult for 3V disease.   Vitals:   Feb 04, 2021 1000 02-04-21 1005 04-Feb-2021 1015 02-04-21 1030  BP: 104/67     Pulse: (!) 58 (!) 57 (!) 58 60  Resp: 20 20 20  (!) 21  Temp:      TempSrc:      SpO2: 100% 100% 100% 95%  Weight:      Height:        Exam:   on vent ,sedated  no jvd  throat ett in place  R temp IJ hd cath  Chest cta bilat and lat  Cor reg no RG  Abd soft ntnd no ascites   Ext no LE edema   Neuro on vent and sedated, not following commands    LUA AVF+ bruit   CXR 2/24 - IMPRESSION: Progression of airspace disease throughout the right lung. Mild right pleural effusion unchanged.   2/23 ECHO - Left ventricular ejection fraction, by estimation, is 20 to 25%. The left ventricle has severely decreased function.  Right ventricular systolic function is moderately reduced. The right ventricular size is normal.    OP HD: [facility]    4h 450/500   75kg  2/2.5 bath  LUA AVF Hep 5000 +[id number] Mircera 75 mcg q2wks - last 01/18/21 Venofer 100mg  qHD x5 (3 completed) Hectorol 8mcg IV qHD    Assessment/ Plan: 1. Cardiac arrest: in assoc w/ CP episode this am 2/24. 40 min total CPR, arrest x 2.  2.  NSTEMI - Hx Severe multivessel CAD - CP resolved. LHC 2/23 showed occluded LAD stent and worsening disease.  Poor candidate for PCI per cardiology.  3. COVID + - incidental finding. Asymptomatic.  Per primary. 4.  ESRD -  On HD MWF.  SP arrest on pressor support. Will plan CRRT.  5.  Hypertension/volume  - BP soft, on midodrine for BP support. 2kg up today. R sided infiltrates worse, ?aspirated. LVEDP 22 yest at cath which is slightly high. Keep even w/ CRRT for now.   6.  Anemia of CKD - Hgb 10.8.  ESA due tomorrow, will order.  Hold venofer with elevated ferritin.  7.  Secondary Hyperparathyroidism -   CCa a little low. Continue VDRA and sensipar.  Phos close to goal, hold auryxia for now due to elevated ferritin.   8.  Nutrition - Renal diet with fluid restrictions.  9. A fib - on amiodarone and metoprolol, hold prior to HD 10.  Systolic heart failure: echo showed LVEF 20-25%, w/ moderate RV malfunction as well.      Travis [name] 02/04/2021, 11:07 AM   Recent Labs  Lab 01/26/2021 0233 02/03/2021 1359 2021/02/04 0147  K 5.1 5.5*  --   BUN 73* 85*  --   CREATININE 8.36* 9.55*  --   CALCIUM 7.7* 7.9*  --   PHOS 5.8* 7.9* 8.0*  HGB 10.8* 11.4* 10.5*   Inpatient medications: . albuterol  2 puff Inhalation Q6H  . vitamin C  500 mg Oral Daily  . atorvastatin  80 mg Oral q1800  . Chlorhexidine Gluconate Cloth  6 each Topical Daily  . cinacalcet  120 mg Oral Daily  . darbepoetin (ARANESP) injection - DIALYSIS  40 mcg Intravenous Q Thu-HD  . dextrose      . doxercalciferol  6 mcg Intravenous Q T,Th,Sa-HD  .  fentaNYL (SUBLIMAZE) injection  50 mcg Intravenous Once  . melatonin  3 mg Oral QHS  . midodrine  10 mg Oral Once per day on Tue Thu Sat  . midodrine  5 mg Oral 3 times per day on Sun Mon Wed Fri  . midodrine  5 mg Oral 2 times per day on Tue Thu Sat  . pantoprazole (PROTONIX) IV  40 mg Intravenous Q12H  . sodium chloride flush  3 mL Intravenous Q12H  . sodium chloride flush  3 mL Intravenous Q12H  . Thrombi-Pad  1 each Topical Once  . zinc sulfate  220 mg Oral Daily   . sodium chloride    . sodium chloride    . sodium chloride    . amiodarone Stopped (Feb 11, 2021 0857)   Followed by  . amiodarone    . amiodarone    . fentaNYL infusion INTRAVENOUS 200 mcg/hr (02/11/2021 1000)  . heparin Stopped (11-Feb-2021 0912)  . norepinephrine (LEVOPHED) Adult infusion 70 mcg/min (02-11-21 1043)  . vasopressin 0.03 Units/min (Feb 11, 2021 1048)   sodium chloride, sodium chloride, sodium chloride, acetaminophen, alteplase, fentaNYL, guaiFENesin-dextromethorphan, heparin, lidocaine (PF),  lidocaine-prilocaine, midazolam, midazolam, ondansetron (ZOFRAN) IV, pentafluoroprop-tetrafluoroeth, sodium chloride flush

## 2021-02-06 NOTE — Progress Notes (Signed)
Chaplain responded to Code Blue for this patient who was on 6E and moved to [place].  During the code Chaplain called spouse and asked to come.  Patient was moved to 54M.  Chaplain waited and escorted wife. Chaplain offered comfort and support.  In [employer] coordinated with MD to speak with Myra, the wife. MD updated and she was very upset and continues to have some hope.  She is able to go in to see patient.  Myra's sister and patient's son on the way.  Coordinated with unit secretary about possibility of other family seeing patient  Will hand off to day chaplain. [place], [place].    07-Feb-2021 [phone]  Clinical Encounter Type  Visited With Family;Health care provider;Patient not available  Visit Type Code;Critical Care  Referral From Nurse  Consult/Referral To Chaplain  Spiritual Encounters  Spiritual Needs Emotional  Stress Factors  Patient Stress Factors Health changes

## 2021-02-06 NOTE — Progress Notes (Signed)
Patient was intubated and transported to room 0O71 without complications.

## 2021-02-06 NOTE — Progress Notes (Addendum)
Progress Note  Patient Name: Travis [name] Date of Encounter: 02/05/2021  [facility] Cardiologist: Glenetta Hew, MD   Subjective   Events of this am noted. Patient with cardiac arrest. Initially bradycardic with agonal rhythm. Prolonged CPR 454 minutes with multiple shocks for VT and multiple rounds of drugs. Patient intubated. Finally did get ROSC and transferred to ICU  Inpatient Medications    Scheduled Meds: . albuterol  2 puff Inhalation Q6H  . amiodarone  200 mg Oral Daily  . vitamin C  500 mg Oral Daily  . atorvastatin  80 mg Oral q1800  . cinacalcet  120 mg Oral Daily  . darbepoetin (ARANESP) injection - DIALYSIS  40 mcg Intravenous Q Thu-HD  . doxercalciferol  6 mcg Intravenous Q T,Th,Sa-HD  . melatonin  3 mg Oral QHS  . metoprolol tartrate  25 mg Oral BID  . midodrine  10 mg Oral Once per day on Tue Thu Sat  . midodrine  5 mg Oral 3 times per day on Sun Mon Wed Fri  . midodrine  5 mg Oral 2 times per day on Tue Thu Sat  . pantoprazole (PROTONIX) IV  40 mg Intravenous Q12H  . sodium chloride flush  3 mL Intravenous Q12H  . sodium chloride flush  3 mL Intravenous Q12H  . zinc sulfate  220 mg Oral Daily   Continuous Infusions: . sodium chloride    . sodium chloride    . sodium chloride    . heparin 950 Units/hr (2021-02-05 0312)  . norepinephrine (LEVOPHED) Adult infusion 5 mcg/min (2021/02/05 0802)   PRN Meds: sodium chloride, sodium chloride, sodium chloride, acetaminophen, alteplase, fentaNYL (SUBLIMAZE) injection **OR** fentaNYL (SUBLIMAZE) injection, guaiFENesin-dextromethorphan, heparin, lidocaine (PF), lidocaine-prilocaine, ondansetron (ZOFRAN) IV, pentafluoroprop-tetrafluoroeth, sodium chloride flush   Vital Signs    Vitals:   02/05/21 0742 02-05-2021 0748 02-05-2021 0751 February 05, 2021 0755  BP:  (!) 70/50 (!) 83/61 (!) 89/51  Pulse: 65 66 71 64  Resp: (!) 29 (!) 25 (!) 33 (!) 29  Temp:      TempSrc:      SpO2: 96% 90% (!) 74% 94%  Weight:       Height:        Intake/Output Summary (Last 24 hours) at 02/05/2021 0815 Last data filed at February 05, 2021 5170 Gross per 24 hour  Intake 79.05 ml  Output -  Net 79.05 ml   Last 3 Weights February 05, 2021 01/30/2021 01/19/2021  Weight (lbs) 170 lb 6.4 oz 170 lb 1.6 oz 170 lb  Weight (kg) 77.293 kg 77.157 kg 77.111 kg      Telemetry    NSR - Personally Reviewed  ECG    Pending   Physical Exam   GEN: intubated and unresponsive Neck:  JVD elevated  Cardiac: RRR, no murmurs, rubs, or gallops.  Respiratory: Coarse BS GI: Soft, nontender, non-distended  MS: No edema; No deformity. Left femoral site without hematoma. Left arm AV fistula.  Has sacral wound vac in place Neuro:  unresponsive. Roaming gaze.   Labs    High Sensitivity Troponin:   Recent Labs  Lab 01/29/2021 1027 01/26/2021 1247 01/23/2021 1920  TROPONINIHS 1,940* 2,324* 4,219*      Chemistry Recent Labs  Lab 01/24/2021 1027 02/05/2021 0233 01/31/21 1359  NA 139 135 136  K 3.7 5.1 5.5*  CL 95* 95* 95*  CO2 30 20* 15*  GLUCOSE 104* 92 70  BUN 51* 73* 85*  CREATININE 6.39* 8.36* 9.55*  CALCIUM 8.4* 7.7* 7.9*  PROT  --  6.4*  --   ALBUMIN  --  3.2* 3.2*  AST  --  95*  --   ALT  --  142*  --   ALKPHOS  --  164*  --   BILITOT  --  1.1  --   GFRNONAA 9* 7* 6*  ANIONGAP 14 20* 26*     Hematology Recent Labs  Lab 01/26/2021 0233 01/17/2021 1359 02-09-21 0147  WBC 9.6 10.7* 9.7  RBC 3.82* 3.94* 3.74*  HGB 10.8* 11.4* 10.5*  HCT 35.2* 36.6* 34.0*  MCV 92.1 92.9 90.9  MCH 28.3 28.9 28.1  MCHC 30.7 31.1 30.9  RDW 18.0* 18.3* 18.0*  PLT 195 185 191    BNPNo results for input(s): BNP, PROBNP in the last 168 hours.   DDimer  Recent Labs  Lab 01/11/2021 1656 01/22/2021 0233 2021/02/09 0147  DDIMER 2.16* 1.20* 1.23*     Radiology    DG Chest 2 View  Result Date: 01/13/2021 CLINICAL DATA:  Chest pain EXAM: CHEST - 2 VIEW COMPARISON:  12/08/2020 FINDINGS: Cardiac enlargement. Mild vascular congestion without  edema. Small right pleural effusion unchanged with associated mild right lower lobe and right middle lobe airspace disease. Left lung base clear. No left effusion. IMPRESSION: Cardiac enlargement with vascular congestion suggesting mild fluid overload Small right effusion and mild right lower lobe atelectasis/infiltrate. Electronically Signed   By: Franchot Gallo M.D.   On: 01/16/2021 10:52   CARDIAC CATHETERIZATION  Result Date: 01/27/2021  1st Diag lesion is 90% stenosed.  Balloon angioplasty was performed.  LPAV lesion is 99% stenosed.  Prox RCA lesion is 80% stenosed.  Dist RCA lesion is 90% stenosed.  RPDA lesion is 70% stenosed.  Ost Cx to Prox Cx lesion is 80% stenosed.  Mid LAD lesion is 100% stenosed.  Prox LAD lesion is 40% stenosed.  1.  Severe heavily calcified three-vessel coronary artery disease.  The LAD stent which was placed in November is occluded.  In addition, there is significant progression of distal RCA disease as well as proximal left circumflex and first diagonal disease. 2.  Mildly elevated left ventricular end-diastolic pressure at 22 mmHg.  Left ventricular angiography was not performed. Recommendations: The coronary arteries are not favorable for PCI. Recommend evaluation for CABG. I ordered an echocardiogram. I discontinued Brilinta. Resume heparin drip 8 hours after sheath pull.   ECHOCARDIOGRAM COMPLETE  Result Date: 01/19/2021    ECHOCARDIOGRAM REPORT   Patient Name:   Travis [name] Date of Exam: 01/31/2021 Medical Rec #:  [id number]         Height:       71.0 in Accession #:    [id number]        Weight:       170.1 lb Date of Birth:  20-Dec-1960        BSA:          1.968 m Patient Age:    60 years          BP:           91/64 mmHg Patient Gender: M                 HR:           79 bpm. Exam Location:  Inpatient Procedure: 2D Echo, 3D Echo, Cardiac Doppler and Color Doppler Indications:    Acute Myocardial Infarction I21.9  History:        Patient has prior  history of Echocardiogram examinations, most  recent 11/26/2020. CHF, CAD; Risk Factors:Hypertension,                 Dyslipidemia and Sleep Apnea. COVID - 19 Positive. ESRD. GERD.  Sonographer:    Tiffany Dance Referring Phys: [place]  1. Left ventricular ejection fraction, by estimation, is 20 to 25%. The left ventricle has severely decreased function. The left ventricle demonstrates regional wall motion abnormalities (see scoring diagram/findings for description). The left ventricular internal cavity size was moderately to severely dilated. There is severe left ventricular hypertrophy of the basal-septal segment. Left ventricular diastolic function could not be evaluated. Elevated left ventricular end-diastolic pressure. There is akinesis of the left ventricular, mid inferoseptal wall and anteroseptal wall. There is akinesis of the left ventricular, apical septal wall, lateral wall, anterior wall, inferior wall and inferolateral wall. There is akinesis of the left ventricular, entire apical segment.  2. Right ventricular systolic function is moderately reduced. The right ventricular size is normal. There is mildly elevated pulmonary artery systolic pressure. The estimated right ventricular systolic pressure is 53.6 mmHg.  3. Left atrial size was severely dilated.  4. Right atrial size was severely dilated.  5. The mitral valve is degenerative. Moderate mitral valve regurgitation. No evidence of mitral stenosis.  6. Tricuspid valve regurgitation is mild to moderate.  7. The aortic valve is abnormal. There is moderate thickening of the aortic valve. Aortic valve regurgitation is moderate. Mild to moderate aortic valve sclerosis/calcification is present, without any evidence of aortic stenosis. Aortic regurgitation PHT measures 313 msec.  8. Aortic dilatation noted. There is mild dilatation of the ascending aorta, measuring 41 mm.  9. The inferior vena cava is dilated in size  with <50% respiratory variability, suggesting right atrial pressure of 15 mmHg. FINDINGS  Left Ventricle: Left ventricular ejection fraction, by estimation, is 20 to 25%. The left ventricle has severely decreased function. The left ventricle demonstrates regional wall motion abnormalities. The left ventricular internal cavity size was moderately to severely dilated. There is severe left ventricular hypertrophy of the basal-septal segment. Left ventricular diastolic function could not be evaluated. Elevated left ventricular end-diastolic pressure. Right Ventricle: The right ventricular size is normal. No increase in right ventricular wall thickness. Right ventricular systolic function is moderately reduced. There is mildly elevated pulmonary artery systolic pressure. The tricuspid regurgitant velocity is 2.53 m/s, and with an assumed right atrial pressure of 15 mmHg, the estimated right ventricular systolic pressure is 64.4 mmHg. Left Atrium: Left atrial size was severely dilated. Right Atrium: Right atrial size was severely dilated. Pericardium: There is no evidence of pericardial effusion. Mitral Valve: The mitral valve is degenerative in appearance. There is mild thickening of the mitral valve leaflet(s). Mild mitral annular calcification. Moderate mitral valve regurgitation. No evidence of mitral valve stenosis. Tricuspid Valve: The tricuspid valve is normal in structure. Tricuspid valve regurgitation is mild to moderate. No evidence of tricuspid stenosis. Aortic Valve: The aortic valve is abnormal. There is moderate thickening of the aortic valve. Aortic valve regurgitation is moderate. Aortic regurgitation PHT measures 313 msec. Mild to moderate aortic valve sclerosis/calcification is present, without any evidence of aortic stenosis. Pulmonic Valve: The pulmonic valve was normal in structure. Pulmonic valve regurgitation is trivial. No evidence of pulmonic stenosis. Aorta: Aortic dilatation noted. There is mild  dilatation of the ascending aorta, measuring 41 mm. Venous: The inferior vena cava is dilated in size with less than 50% respiratory variability, suggesting right atrial pressure of 15 mmHg. IAS/Shunts: No  atrial level shunt detected by color flow Doppler.  LEFT VENTRICLE PLAX 2D LVIDd:         6.20 cm  Diastology LVIDs:         4.90 cm  LV e' medial:    4.90 cm/s LV PW:         1.20 cm  LV E/e' medial:  23.4 LV IVS:        1.93 cm  LV e' lateral:   8.70 cm/s LVOT diam:     1.80 cm  LV E/e' lateral: 13.2 LV SV:         45 LV SV Index:   23 LVOT Area:     2.54 cm  RIGHT VENTRICLE            IVC RV Basal diam:  3.30 cm    IVC diam: 2.90 cm RV Mid diam:    2.70 cm RV S prime:     7.72 cm/s TAPSE (M-mode): 1.5 cm LEFT ATRIUM              Index       RIGHT ATRIUM           Index LA diam:        6.60 cm  3.35 cm/m  RA Area:     31.40 cm LA Vol (A2C):   133.0 ml 67.57 ml/m RA Volume:   119.00 ml 60.46 ml/m LA Vol (A4C):   125.0 ml 63.51 ml/m LA Biplane Vol: 133.0 ml 67.57 ml/m  AORTIC VALVE LVOT Vmax:   99.30 cm/s LVOT Vmean:  72.000 cm/s LVOT VTI:    0.176 m AI PHT:      313 msec  AORTA Ao Root diam: 3.40 cm Ao Asc diam:  4.10 cm MITRAL VALVE                 TRICUSPID VALVE MV Area (PHT): 4.06 cm      TR Peak grad:   25.6 mmHg MV Decel Time: 187 msec      TR Vmax:        253.00 cm/s MR Peak grad:    59.0 mmHg MR Mean grad:    36.0 mmHg   SHUNTS MR Vmax:         384.00 cm/s Systemic VTI:  0.18 m MR Vmean:        279.0 cm/s  Systemic Diam: 1.80 cm MR PISA:         1.01 cm MR PISA Eff ROA: 10 mm MR PISA Radius:  0.40 cm MV E velocity: 114.50 cm/s Fransico Him MD Electronically signed by Fransico Him MD Signature Date/Time: 01/21/2021/5:25:16 PM    Final    CT Angio Chest/Abd/Pel for Dissection W and/or W/WO  Result Date: 01/11/2021 CLINICAL DATA:  60 year old male with left-sided chest pain and concern for thoracic aortic aneurysm. EXAM: CT ANGIOGRAPHY CHEST, ABDOMEN AND PELVIS TECHNIQUE: Non-contrast CT of the  chest was initially obtained. Multidetector CT imaging through the chest, abdomen and pelvis was performed using the standard protocol during bolus administration of intravenous contrast. Multiplanar reconstructed images and MIPs were obtained and reviewed to evaluate the vascular anatomy. CONTRAST:  146mL OMNIPAQUE IOHEXOL 350 MG/ML SOLN COMPARISON:  Chest CT dated 11/26/2020. FINDINGS: CTA CHEST FINDINGS Cardiovascular: There is mild to moderate cardiomegaly, new since the prior CT. No pericardial effusion. Three-vessel coronary vascular calcification. There is retrograde flow of contrast from the right atrium into the IVC suggestive of right heart dysfunction. There is mild  atherosclerotic calcification of the thoracic aorta. No aneurysmal dilatation or dissection. The ascending aorta measures up to 3.9 cm in diameter. The origins of the great vessels of the aortic arch appear patent. The central pulmonary arteries are patent and appear unremarkable for the degree of opacification. Mediastinum/Nodes: Mildly enlarged right hilar lymph nodes measuring 13 mm. Anterior paratracheal lymph node measures 15 mm. The esophagus is grossly unremarkable. A 9 mm calcified nodule from the left posterior thyroid appears similar to prior CT. No mediastinal fluid collection. Lungs/Pleura: Bilateral patchy and confluent ground-glass opacities, right greater left. There is diffuse interstitial and interlobular septal prominence. Small right pleural effusion noted. Findings consistent with asymmetric pulmonary edema and possible superimposed pneumonia. Clinical correlation is recommended. Significant improvement in aeration of the lungs compared to prior CT. A 2.5 x 2.0 cm masslike subpleural consolidation in the right middle lobe has decreased since the prior CT. The central airways are patent. Musculoskeletal: There is diffuse osteosclerosis consistent with renal osteodystrophy. No acute osseous pathology. Review of the MIP images  confirms the above findings. CTA ABDOMEN AND PELVIS FINDINGS VASCULAR Aorta: Moderate atherosclerotic calcification. No aneurysmal dilatation or dissection. No periaortic fluid collection. Celiac: Patent without evidence of aneurysm, dissection, vasculitis or significant stenosis. SMA: Patent without evidence of aneurysm, dissection, vasculitis or significant stenosis. Renals: Atherosclerotic calcification of the origin of the renal arteries. The left renal artery appears patent but with significantly diminished flow distally. Status post prior right nephrectomy. IMA: Patent without evidence of aneurysm, dissection, vasculitis or significant stenosis. Inflow: Atherosclerotic calcification of the iliac arteries. The iliac arteries remain patent. Veins: No obvious venous abnormality within the limitations of this arterial phase study. Review of the MIP images confirms the above findings. NON-VASCULAR No intra-abdominal free air.  Small ascites. Hepatobiliary: The liver is enlarged measuring approximately 18 cm in midclavicular length. No intrahepatic biliary ductal dilatation. Gallbladder sludge versus vicarious excretion of contrast. Pancreas: Unremarkable. No pancreatic ductal dilatation or surrounding inflammatory changes. Spleen: Normal in size without focal abnormality. Adrenals/Urinary Tract: The adrenal glands unremarkable. Status post prior right nephrectomy. The left kidney is atrophic. Multiple small left renal hypodense lesions are not characterized on this CT. There is no hydronephrosis or nephrolithiasis. The left ureter is unremarkable. The urinary bladder is collapsed. Stomach/Bowel: Scattered colonic diverticula without active inflammatory changes. There is no bowel obstruction or active inflammation. The appendix is normal. Lymphatic: No adenopathy. Reproductive: The prostate and seminal vesicles are grossly unremarkable. No pelvic mass. Other: Mild diffuse subcutaneous edema.  No fluid collection.  Musculoskeletal: Diffuse osteosclerosis in keeping with renal osteodystrophy. No acute osseous pathology. Review of the MIP images confirms the above findings. IMPRESSION: 1. No aortic dissection or aneurysm. 2. Cardiomegaly with findings of asymmetric pulmonary edema and possible superimposed pneumonia. Clinical correlation is recommended. 3. Small right pleural effusion. 4. Significant improvement in aeration of the lungs compared to the prior CT. Interval decrease in the size of the right middle lobe subpleural masslike consolidation. Attention on follow-up imaging recommended. 5. Chronic kidney disease and prior right nephrectomy. Renal osteodystrophy. 6. Colonic diverticulosis. No bowel obstruction. Normal appendix. 7. Aortic Atherosclerosis (ICD10-I70.0). Electronically Signed   By: Anner Crete M.D.   On: 01/15/2021 16:21    Cardiac Studies   LEFT HEART CATH AND CORONARY ANGIOGRAPHY    Conclusion    1st Diag lesion is 90% stenosed.  Balloon angioplasty was performed.  LPAV lesion is 99% stenosed.  Prox RCA lesion is 80% stenosed.  Dist RCA lesion is 90%  stenosed.  RPDA lesion is 70% stenosed.  Ost Cx to Prox Cx lesion is 80% stenosed.  Mid LAD lesion is 100% stenosed.  Prox LAD lesion is 40% stenosed.   1.  Severe heavily calcified three-vessel coronary artery disease.  The LAD stent which was placed in November is occluded.  In addition, there is significant progression of distal RCA disease as well as proximal left circumflex and first diagonal disease. 2.  Mildly elevated left ventricular end-diastolic pressure at 22 mmHg.  Left ventricular angiography was not performed.  Recommendations: The coronary arteries are not favorable for PCI. Recommend evaluation for CABG. I ordered an echocardiogram. I discontinued Brilinta. Resume heparin drip 8 hours after sheath pull.  Coronary Diagrams   Diagnostic Dominance: Right       Echo 02/01/20:IMPRESSIONS     1. Left ventricular ejection fraction, by estimation, is 20 to 25%. The  left ventricle has severely decreased function. The left ventricle  demonstrates regional wall motion abnormalities (see scoring  diagram/findings for description). The left  ventricular internal cavity size was moderately to severely dilated. There  is severe left ventricular hypertrophy of the basal-septal segment. Left  ventricular diastolic function could not be evaluated. Elevated left  ventricular end-diastolic pressure.  There is akinesis of the left ventricular, mid inferoseptal wall and  anteroseptal wall. There is akinesis of the left ventricular, apical  septal wall, lateral wall, anterior wall, inferior wall and inferolateral  wall. There is akinesis of the left  ventricular, entire apical segment.  2. Right ventricular systolic function is moderately reduced. The right  ventricular size is normal. There is mildly elevated pulmonary artery  systolic pressure. The estimated right ventricular systolic pressure is  90.3 mmHg.  3. Left atrial size was severely dilated.  4. Right atrial size was severely dilated.  5. The mitral valve is degenerative. Moderate mitral valve regurgitation.  No evidence of mitral stenosis.  6. Tricuspid valve regurgitation is mild to moderate.  7. The aortic valve is abnormal. There is moderate thickening of the  aortic valve. Aortic valve regurgitation is moderate. Mild to moderate  aortic valve sclerosis/calcification is present, without any evidence of  aortic stenosis. Aortic regurgitation  PHT measures 313 msec.  8. Aortic dilatation noted. There is mild dilatation of the ascending  aorta, measuring 41 mm.  9. The inferior vena cava is dilated in size with <50% respiratory  variability, suggesting right atrial pressure of 15 mmHg.     Patient Profile     60 y.o. male with complex medical history s/p anterior STEMI in November with emergent stenting of  the mid LAD. Hospital course complicated by MRSA PNA with pleural effusion--chest tube, bacteremia, ESRD requiring dialysis, GI bleed,  and sacral decubitus. DC on Jan 6 and has been improving. Now presents with NSTEMI  Assessment & Plan    1. Cardiogenic shock now s/p CPR. Admitted with NSTEMI. Peak troponin 4219. No significant Ecg changes over baseline.  Repeat cardiac cath yesterday showed marked progression of CAD since November. LAD stent now occluded. Severe disease in a large first diagonal, ostial and mid LCx, proximal, mid and distal RCA. Poorly suited for PCI. Planned for CT surgery consult. Brilinta held. History of ASA allergy. On IV heparin. Echo showed significant worsening of LV function with EF from 45% to 20%.  Now with Cardiac arrest this am- initially bradycardic but then with multiple VT runs. Prolonged CPR. Intubated. In NSR now but hypotensive with cardiogenic shock. IV levophed started. Will  load with IV amiodarone and continue drip.  Discussed with patient's wife. Prognosis is quite grim with his underlying substrate and prolonged Code. Will ask CCM and Advanced heart failure teams to see. Renal service notified. Given events I think it is unlikely he will be a candidate for CT surgery.  2. ESRD on HD.  Renal on board. ? [place] today. 3. History of Afib.  4. Ischemic CM. Prior EF 40-45%. Now down to 20% with marked progression of CAD and now occluded LAD.  5. Sacral decubitus. Wound Vac in place.wound care team following.  6. Covid 19 + - no clinical symptoms.  7. GERD. On protonix.      For questions or updates, please contact [place] Please consult [email or web address] for contact info under        Signed, Peter Martinique, MD  14-Feb-2021, 8:15 AM

## 2021-02-06 NOTE — Procedures (Signed)
Central Venous Catheter Insertion Procedure Note  GAMAL TODISCO  [id number]  02/04/1961  Date:2021-02-21  Time:9:53 AM   Provider Performing:Sharman Garrott [facility]   Procedure: Insertion of Non-tunneled Central Venous Catheter(36556)with US guidance (40370)    Indication(s) Hemodialysis  Consent Risks of the procedure as well as the alternatives and risks of each were explained to the patient and/or caregiver.  Consent for the procedure was obtained and is signed in the bedside chart  Anesthesia see mar,   Timeout Verified patient identification, verified procedure, site/side was marked, verified correct patient position, special equipment/implants available, medications/allergies/relevant history reviewed, required imaging and test results available.  Sterile Technique Maximal sterile technique including full sterile barrier drape, hand hygiene, sterile gown, sterile gloves, mask, hair covering, sterile ultrasound probe cover (if used).  Procedure Description Area of catheter insertion was cleaned with chlorhexidine and draped in sterile fashion.   With real-time ultrasound guidance a HD catheter was placed into the right internal jugular vein.  Nonpulsatile blood flow and easy flushing noted in all ports.  The catheter was sutured in place and sterile dressing applied.  Complications/Tolerance None; patient tolerated the procedure well. Chest X-ray is ordered to verify placement for internal jugular or subclavian cannulation.  Chest x-ray is not ordered for femoral cannulation.  EBL Minimal  Specimen(s) None   Line was done under u/s guidance. Easily compressible vein noted with neighboring pulsatile artery. Upon stick dark red non pulsatile blood was noted. Wire easily advanced. Wire was verified to be in easily compressible/non pulsatile vessel in both cross sectional and longitudinal views under ultrasound guidance. Skin was easily dilated and catheter advanced without  issue. Wire was withdrawn from vessel. No air was aspirated thru entirety of the procedure. Pt tolerated well. No complications were appreciated.

## 2021-02-06 DEATH — deceased

## 2021-02-08 MED FILL — Medication: Qty: 1 | Status: AC

## 2021-02-12 ENCOUNTER — Ambulatory Visit: Payer: Medicare Other | Admitting: Cardiology

## 2021-02-12 NOTE — Progress Notes (Signed)
Subjective:   Patient ID: Travis [name], male   DOB: 60 y.o.   MRN: [id number]   HPI Patient presents stating has had some nails that are thickened in both feet and gets calluses and dry skin and wanted it checked.  Patient is not currently smoking and is not currently active   Review of Systems  All other systems reviewed and are negative.       Objective:  Physical Exam Vitals and nursing note reviewed.  Constitutional:      Appearance: He is well-developed and well-nourished.  Cardiovascular:     Pulses: Intact distal pulses.  Pulmonary:     Effort: Pulmonary effort is normal.  Musculoskeletal:        General: Normal range of motion.  Skin:    General: Skin is warm.  Neurological:     Mental Status: He is alert.     Neurovascular status was found to be intact with muscle strength found to be adequate of the subtalar midtarsal joint.  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  I hope this will help him and I advised him on using Vaseline or lubricants and encouraged him to call with any questions concerns which may arise

## 2021-05-21 ENCOUNTER — Ambulatory Visit: Payer: Medicare Other | Admitting: Podiatry

## 2022-06-03 IMAGING — DX DG CHEST 1V PORT
1 series · 2 of 2 positions shown · non-contrast
Comparison: January 30, 2015

CLINICAL DATA: Acute MI.  Shortness of breath.

EXAM:
PORTABLE CHEST 1 VIEW

[Series 1: chest · 0.14mm/px · 2 of 2 slices shown]
[im 1/2]
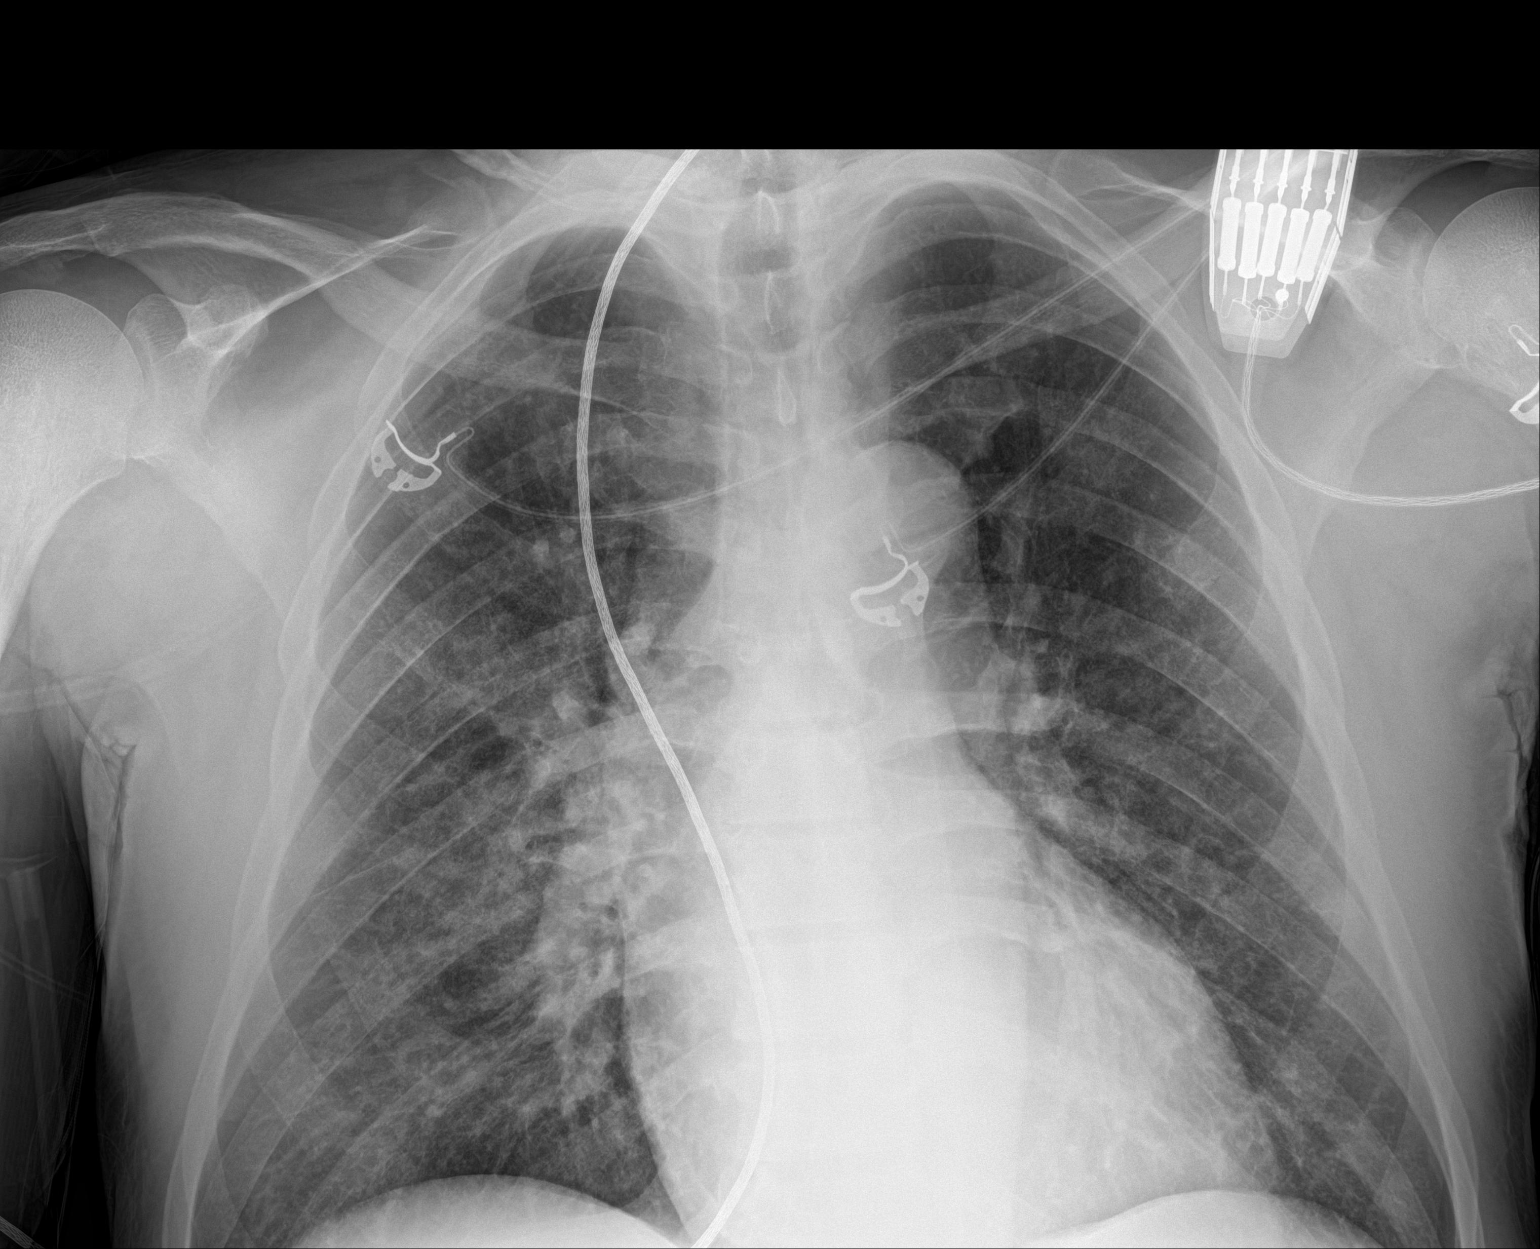
[im 2/2]
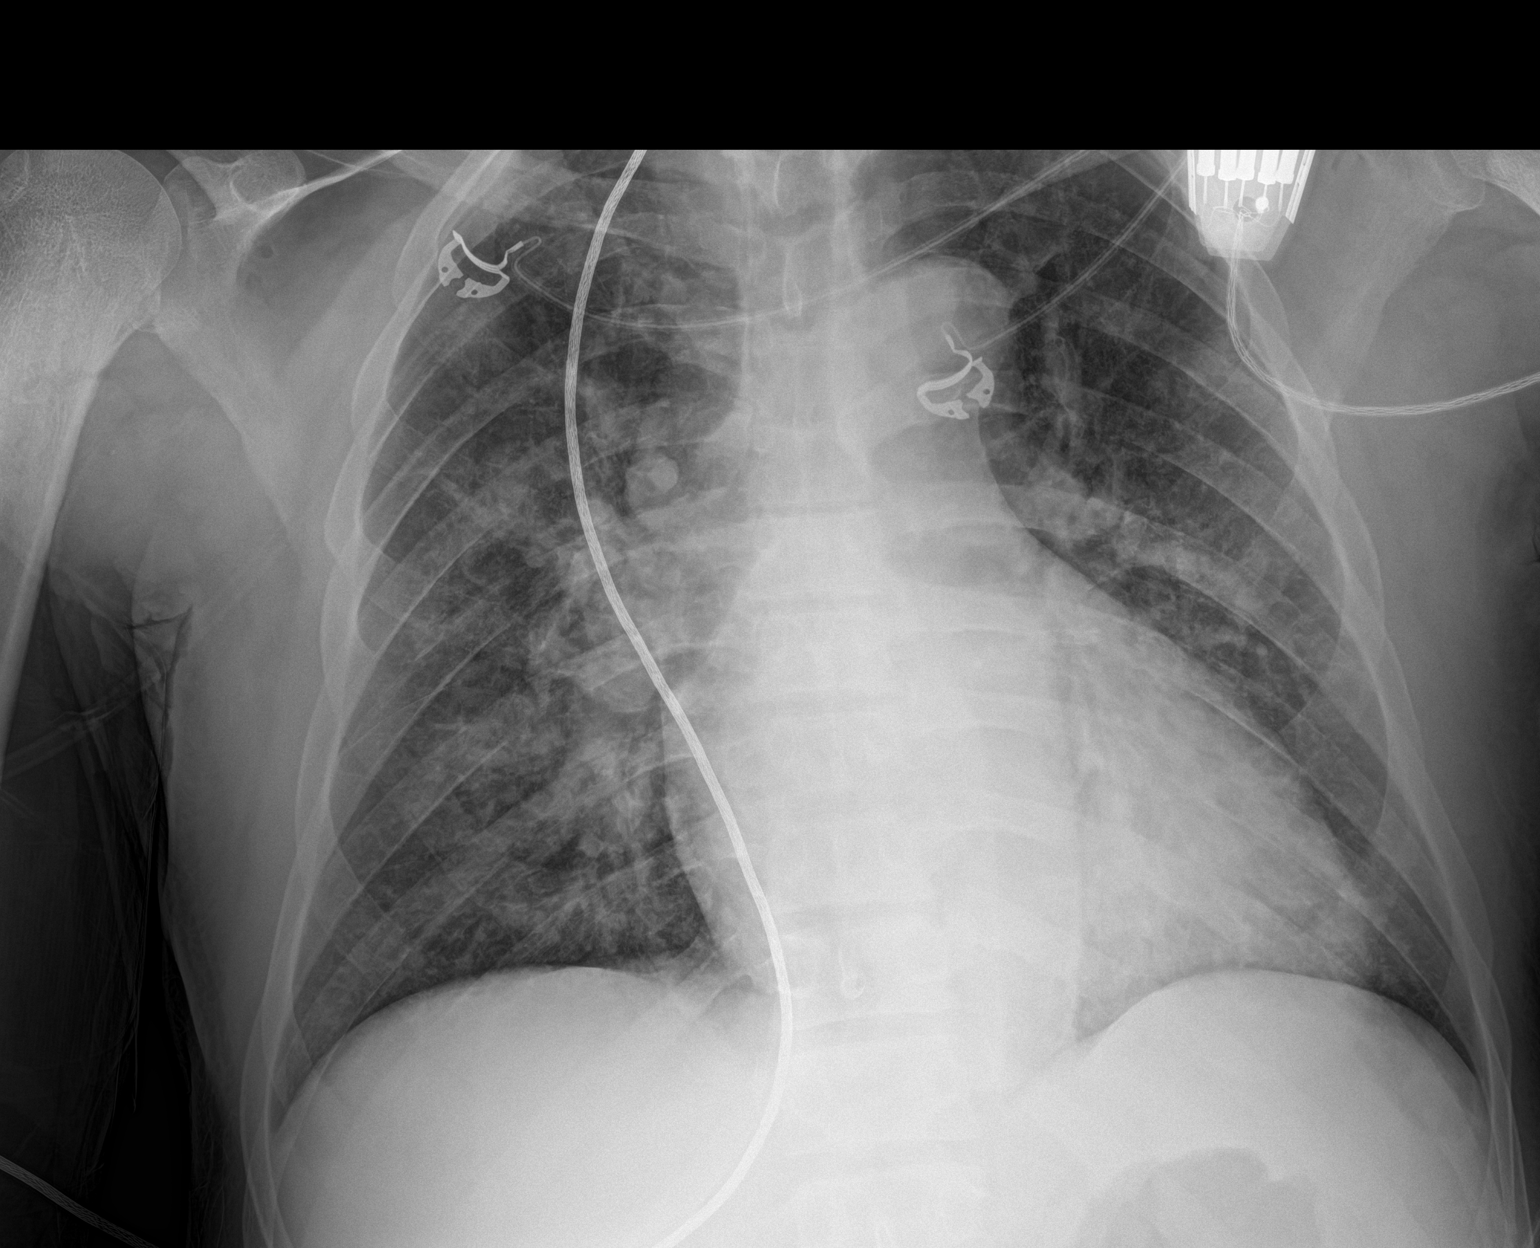

[2 of 2 positions shown; findings below may reference images not displayed]

FINDINGS: The cardiomediastinal silhouette is stable. Mild increased
interstitial opacities seen in the perihilar regions, right greater
than left. No pneumothorax. No other acute abnormalities.
IMPRESSION: Mild perihilar opacities are suggestive of mild edema given the
history provided of an acute myocardial infarction. Recommend
attention on follow-up.

## 2022-06-27 IMAGING — DX DG CHEST 1V
1 series · 1 of 1 positions shown · non-contrast
Comparison: 11/20/2020.

CLINICAL DATA: Pleural effusion.

EXAM:
CHEST  1 VIEW

[chest ap]
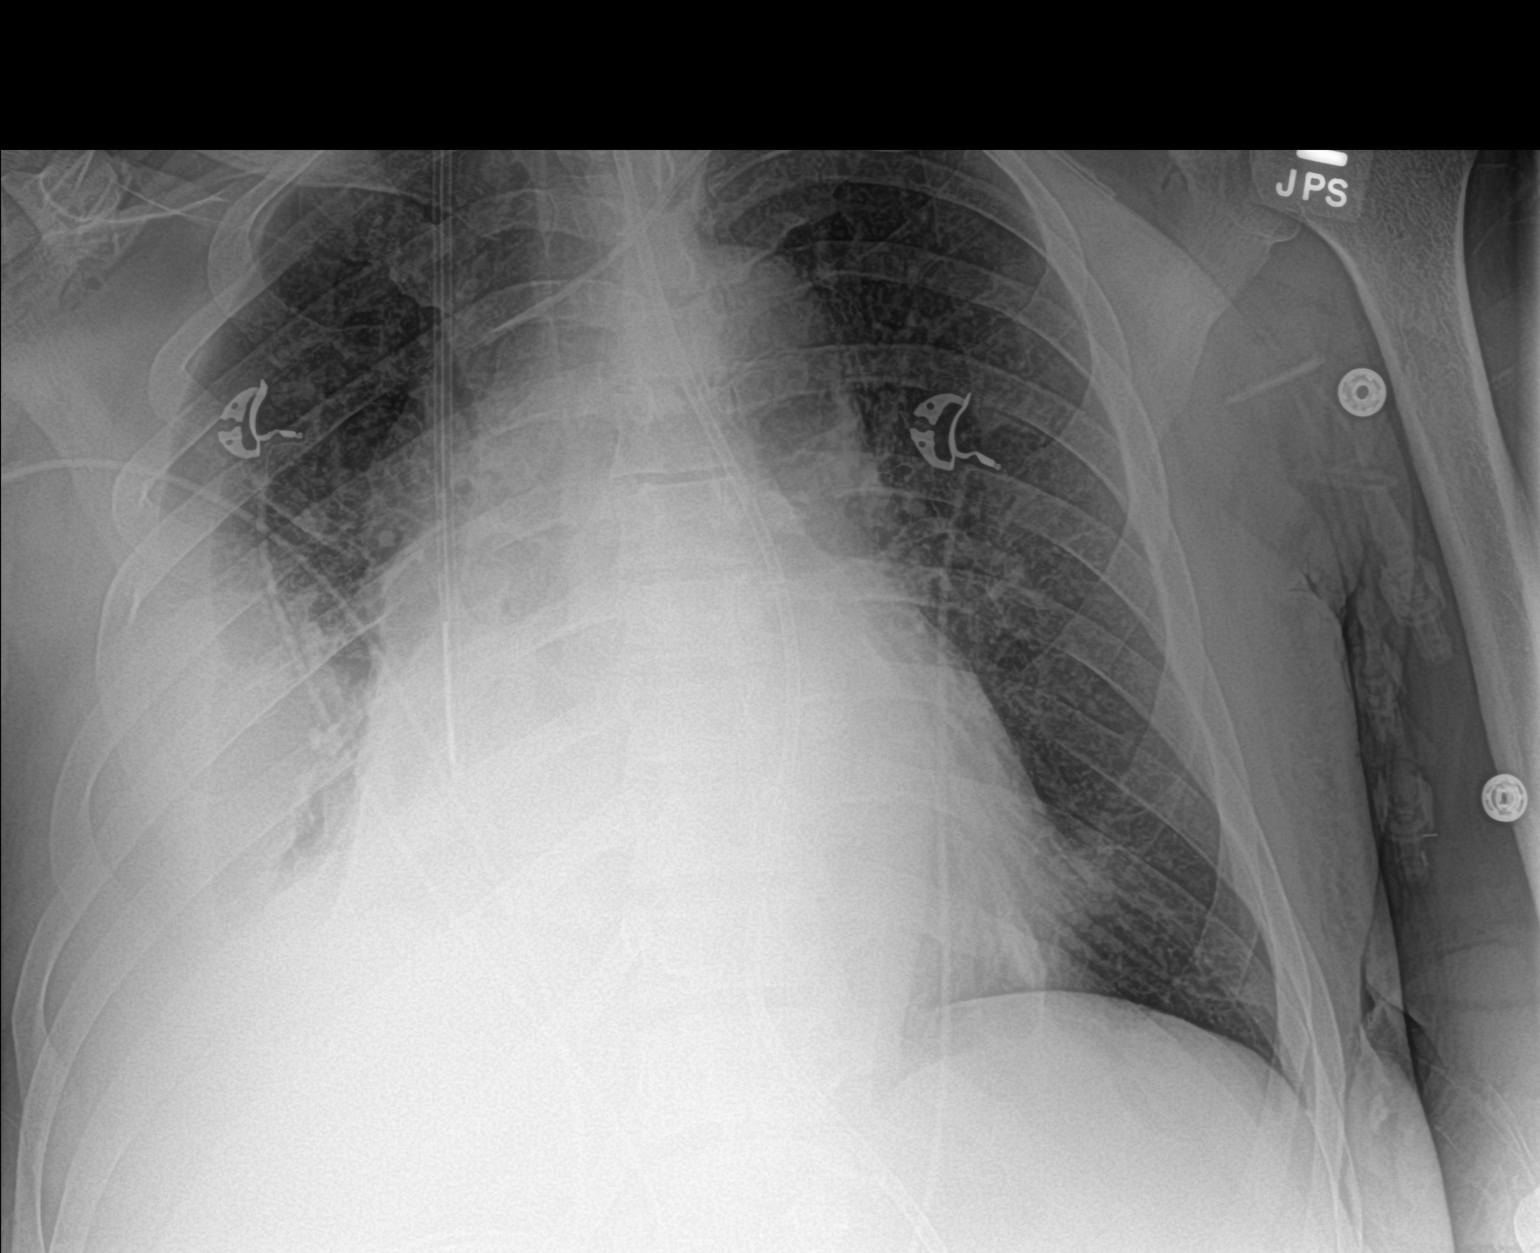

[1 of 1 positions shown; findings below may reference images not displayed]

FINDINGS: Feeding tube, left subclavian line, right IJ line in stable
position. Stable cardiomegaly. Progressive dense right base
atelectasis/consolidation. Large progressive right-sided pleural
effusion. Previously identified drainage tube over the right
chest/upper abdomen not identified on today's exam. Mild right upper
chest wall subcutaneous emphysema.
IMPRESSION: 1. Progressive dense right base atelectasis/consolidation. Large
progressive right-sided pleural effusion.
2. Previously identified drainage tube over the right chest/upper
abdomen not identified on today's exam.
3. Feeding tube and bilateral IJ lines in stable position.
4. Stable cardiomegaly.

## 2022-06-28 IMAGING — DX DG CHEST 1V PORT
1 series · 1 of 1 positions shown · non-contrast
Comparison: 11/21/2020

CLINICAL DATA: Status post chest tube placement

EXAM:
PORTABLE CHEST 1 VIEW

[chest ap]
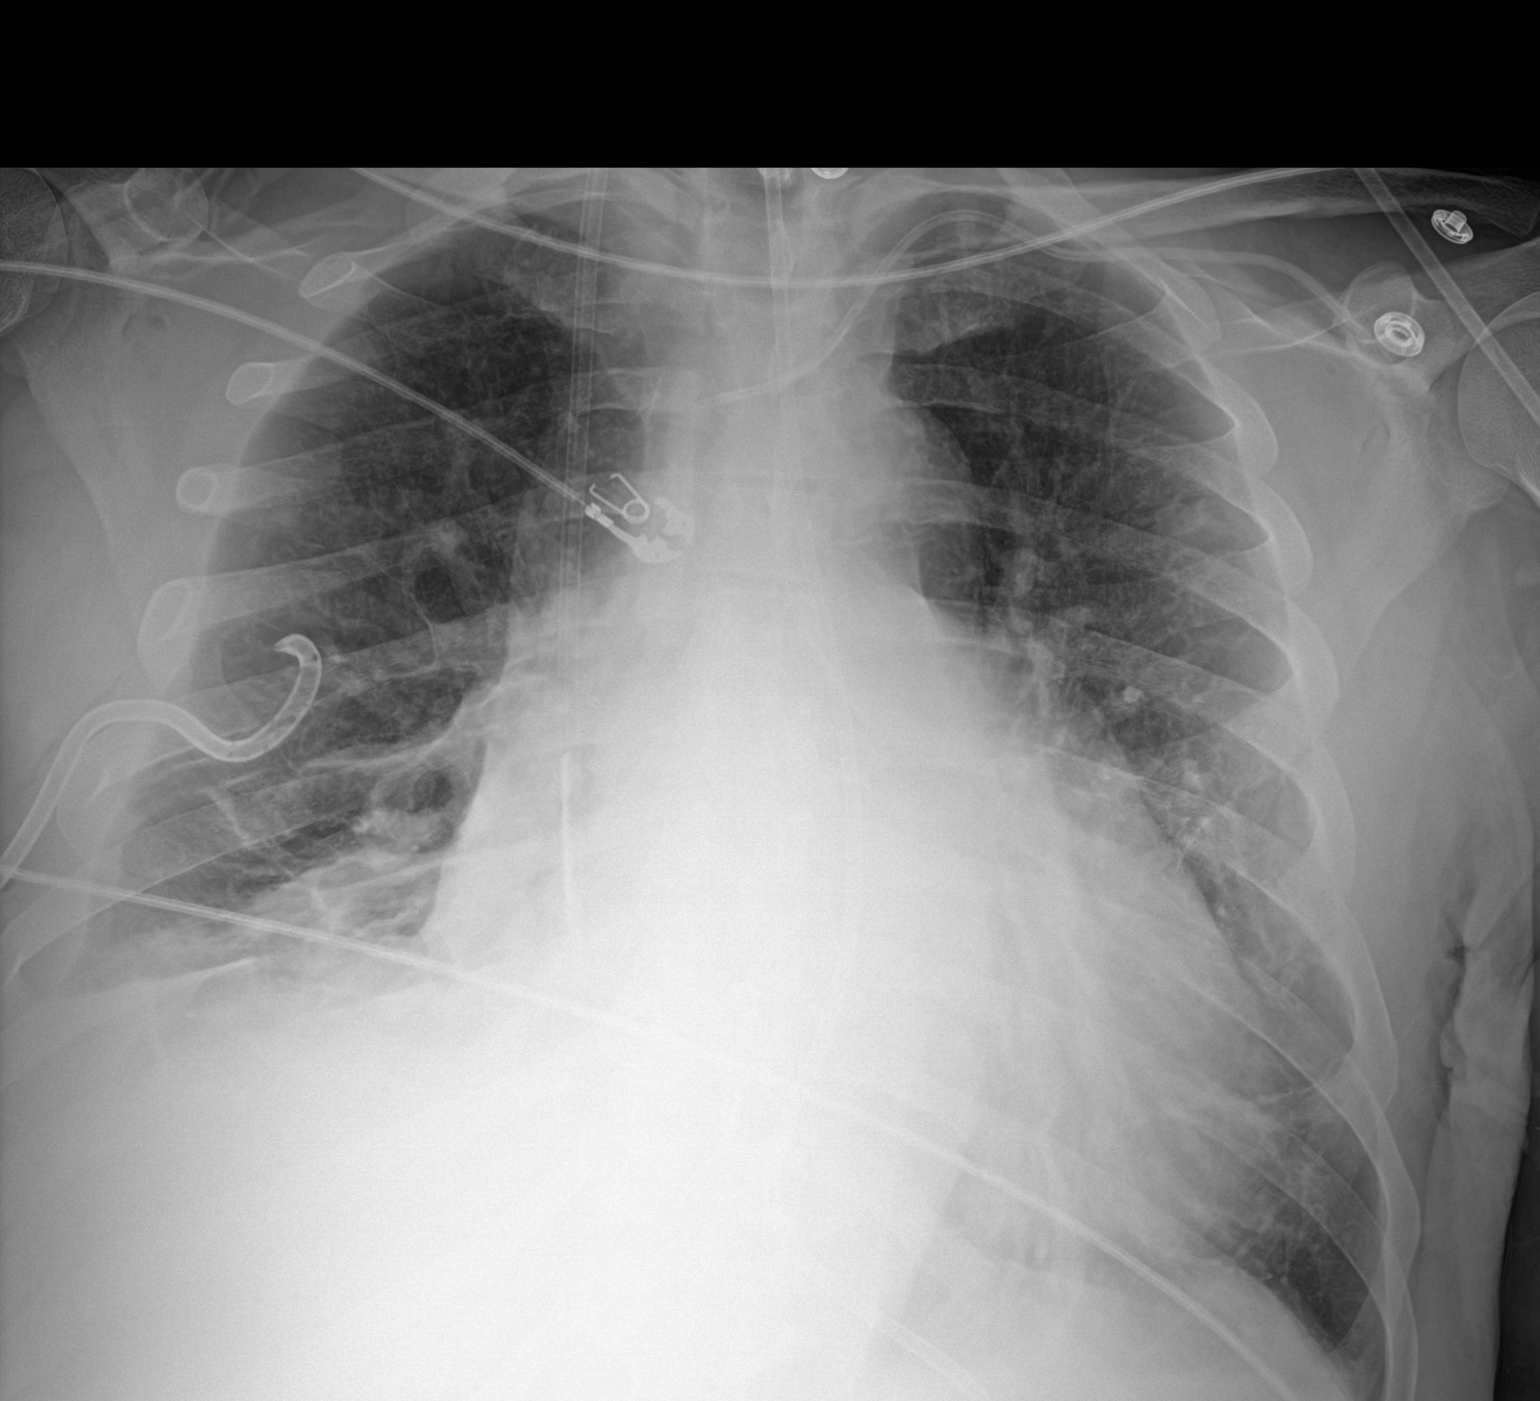

[1 of 1 positions shown; findings below may reference images not displayed]

FINDINGS: Cardiac shadow remains enlarged in size. Left subclavian and right
jugular central lines are again seen and stable. Feeding catheter
extends into the stomach. Right-sided pigtail thoracostomy tube is
noted. No pneumothorax is seen. Mild right basilar atelectasis is
again noted.
IMPRESSION: No evidence of pneumothorax.

Mild right basilar atelectasis.

## 2022-07-04 IMAGING — DX DG CHEST 1V PORT
1 series · 1 of 1 positions shown · non-contrast
Comparison: 11/26/2020 and prior.

CLINICAL DATA: Pulmonary edema

EXAM:
PORTABLE CHEST 1 VIEW

[chest]
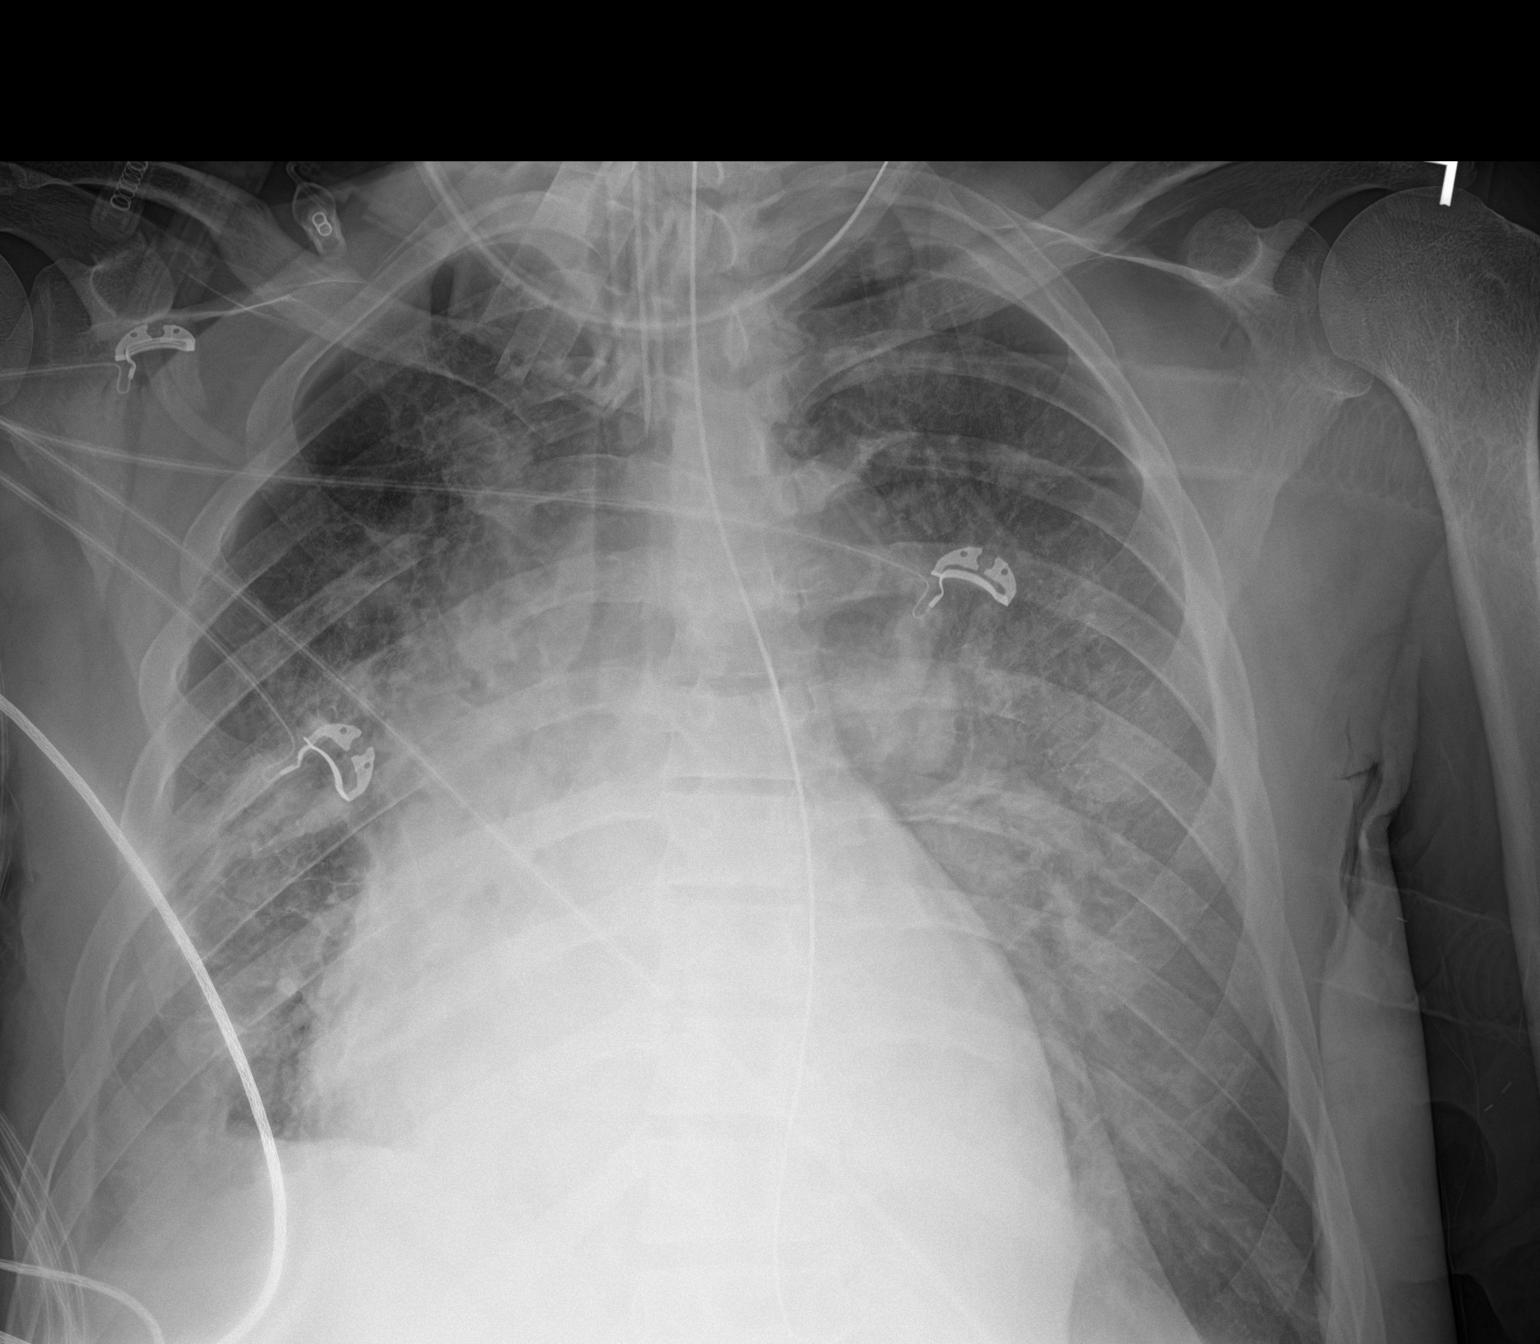

[1 of 1 positions shown; findings below may reference images not displayed]

FINDINGS: Support lines and tubes are unchanged. Cardiomegaly. No
pneumothorax. Small right pleural effusion. The left costophrenic
sulcus is partially imaged. Decreased conspicuity of patchy
bilateral pulmonary opacities.
IMPRESSION: Decreased conspicuity of bilateral pulmonary opacities.

Small right pleural effusion, unchanged.

## 2022-07-05 IMAGING — DX DG CHEST 1V PORT
1 series · 1 of 1 positions shown · non-contrast
Comparison: 11/28/2020

CLINICAL DATA: Intubated

EXAM:
PORTABLE CHEST 1 VIEW

[chest]
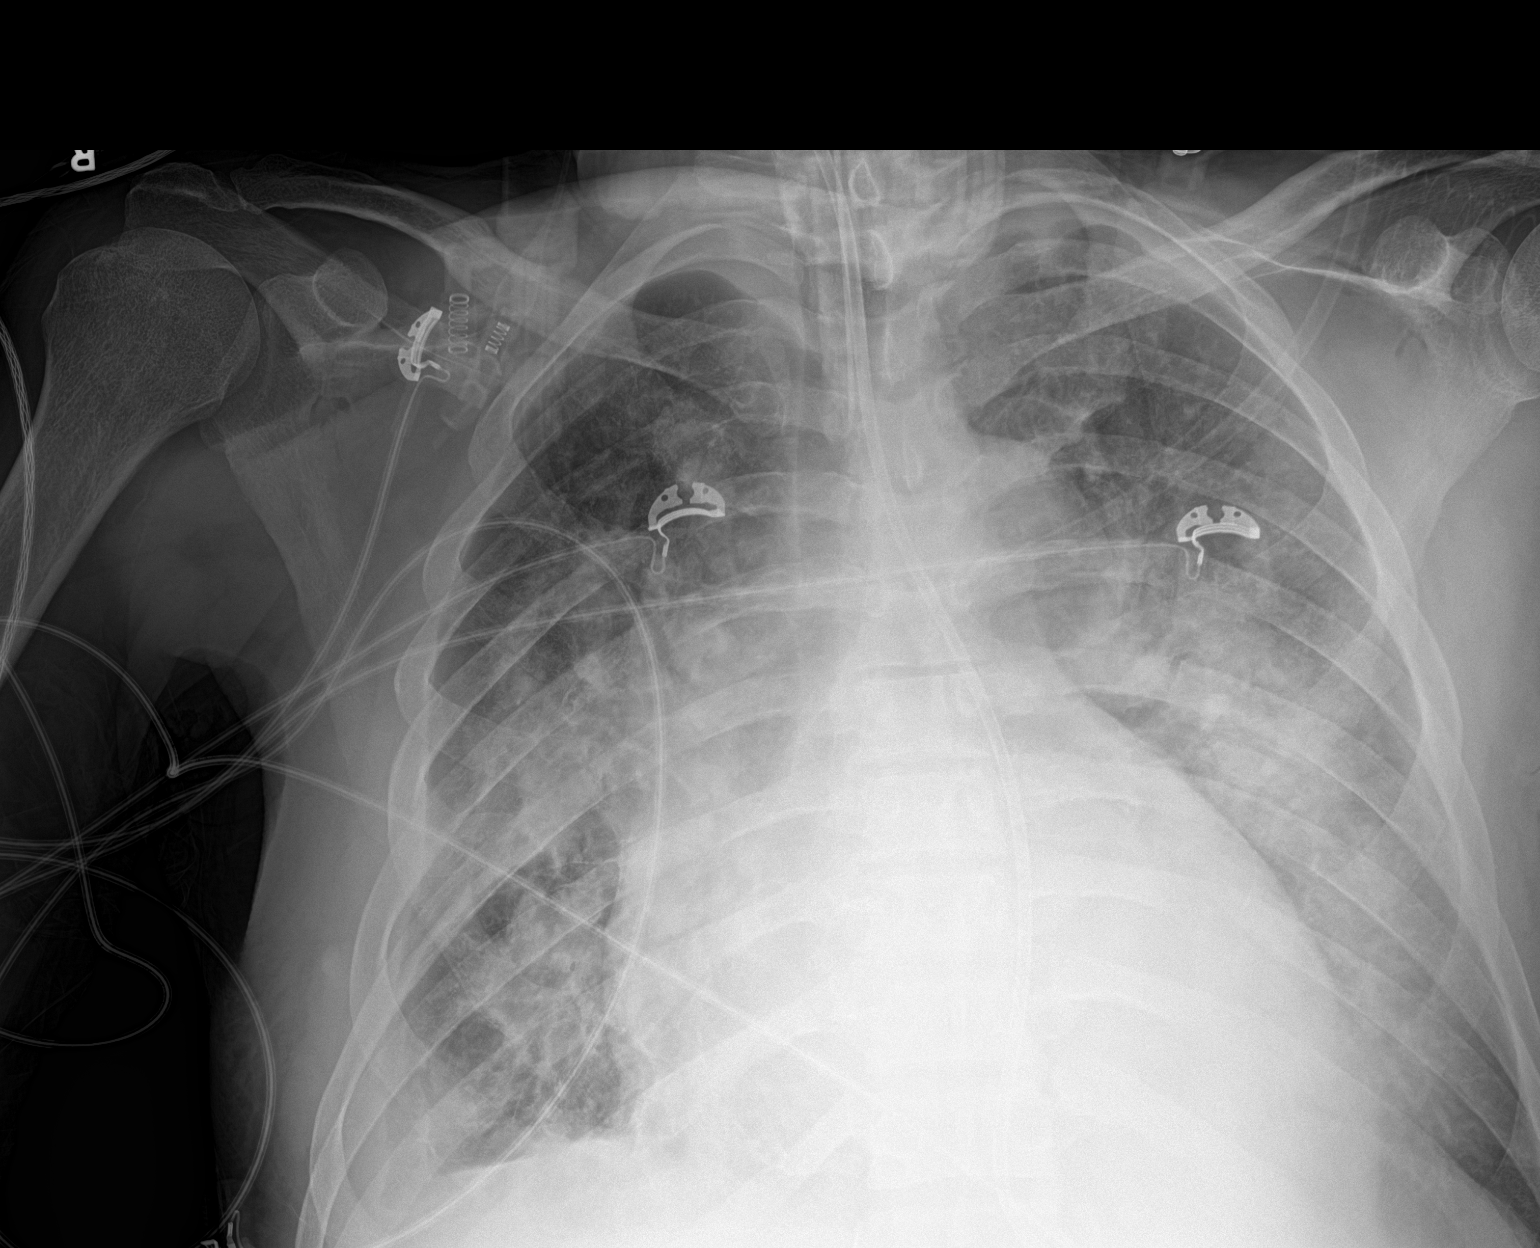

[1 of 1 positions shown; findings below may reference images not displayed]

FINDINGS: Endotracheal tube and partially imaged enteric tube. Persistent
bilateral primarily perihilar and basilar opacities. Similar small
right pleural effusion. No pneumothorax. Stable cardiomediastinal
contours.
IMPRESSION: Stable lines and tubes. Persistent bilateral perihilar and basilar
opacities and small right pleural effusion.
